# Patient Record
Sex: Female | Born: 1935
Health system: Southern US, Community
[De-identification: ages and names within clinical notes are randomized; demographics above are authoritative.]

## PROBLEM LIST (undated history)

## (undated) DIAGNOSIS — E119 Type 2 diabetes mellitus without complications: Secondary | ICD-10-CM

## (undated) DIAGNOSIS — M1990 Unspecified osteoarthritis, unspecified site: Secondary | ICD-10-CM

## (undated) DIAGNOSIS — I639 Cerebral infarction, unspecified: Secondary | ICD-10-CM

## (undated) DIAGNOSIS — M109 Gout, unspecified: Secondary | ICD-10-CM

## (undated) DIAGNOSIS — I82409 Acute embolism and thrombosis of unspecified deep veins of unspecified lower extremity: Secondary | ICD-10-CM

## (undated) DIAGNOSIS — H269 Unspecified cataract: Secondary | ICD-10-CM

## (undated) DIAGNOSIS — Z9221 Personal history of antineoplastic chemotherapy: Secondary | ICD-10-CM

## (undated) DIAGNOSIS — N183 Chronic kidney disease, stage 3 unspecified: Secondary | ICD-10-CM

## (undated) DIAGNOSIS — E7800 Pure hypercholesterolemia, unspecified: Secondary | ICD-10-CM

## (undated) DIAGNOSIS — IMO0001 Reserved for inherently not codable concepts without codable children: Secondary | ICD-10-CM

## (undated) DIAGNOSIS — Z98811 Dental restoration status: Secondary | ICD-10-CM

## (undated) DIAGNOSIS — C44310 Basal cell carcinoma of skin of unspecified parts of face: Secondary | ICD-10-CM

## (undated) DIAGNOSIS — R011 Cardiac murmur, unspecified: Secondary | ICD-10-CM

## (undated) DIAGNOSIS — I10 Essential (primary) hypertension: Secondary | ICD-10-CM

## (undated) DIAGNOSIS — IMO0002 Reserved for concepts with insufficient information to code with codable children: Secondary | ICD-10-CM

## (undated) DIAGNOSIS — Z923 Personal history of irradiation: Secondary | ICD-10-CM

## (undated) DIAGNOSIS — Z972 Presence of dental prosthetic device (complete) (partial): Secondary | ICD-10-CM

## (undated) DIAGNOSIS — I2699 Other pulmonary embolism without acute cor pulmonale: Secondary | ICD-10-CM

## (undated) DIAGNOSIS — C50911 Malignant neoplasm of unspecified site of right female breast: Secondary | ICD-10-CM

## (undated) HISTORY — PX: DILATION AND CURETTAGE OF UTERUS: SHX78

## (undated) HISTORY — DX: Reserved for concepts with insufficient information to code with codable children: IMO0002

## (undated) HISTORY — DX: Essential (primary) hypertension: I10

## (undated) HISTORY — DX: Reserved for inherently not codable concepts without codable children: IMO0001

## (undated) HISTORY — DX: Unspecified osteoarthritis, unspecified site: M19.90

## (undated) HISTORY — PX: TONSILLECTOMY: SUR1361

---

## 1958-11-03 HISTORY — PX: BREAST CYST EXCISION: SHX579

## 1981-03-04 DIAGNOSIS — C44310 Basal cell carcinoma of skin of unspecified parts of face: Secondary | ICD-10-CM

## 1981-03-04 HISTORY — PX: BASAL CELL CARCINOMA EXCISION: SHX1214

## 1981-03-04 HISTORY — DX: Basal cell carcinoma of skin of unspecified parts of face: C44.310

## 1997-05-20 ENCOUNTER — Other Ambulatory Visit: Admission: RE | Admit: 1997-05-20 | Discharge: 1997-05-20 | Payer: Self-pay | Admitting: Gynecology

## 1997-06-12 ENCOUNTER — Emergency Department (HOSPITAL_COMMUNITY): Admission: EM | Admit: 1997-06-12 | Discharge: 1997-06-12 | Payer: Self-pay | Admitting: Emergency Medicine

## 1998-01-03 ENCOUNTER — Ambulatory Visit (HOSPITAL_COMMUNITY): Admission: RE | Admit: 1998-01-03 | Discharge: 1998-01-03 | Payer: Self-pay | Admitting: Gastroenterology

## 1998-03-08 ENCOUNTER — Ambulatory Visit (HOSPITAL_COMMUNITY): Admission: RE | Admit: 1998-03-08 | Discharge: 1998-03-08 | Payer: Self-pay | Admitting: Obstetrics & Gynecology

## 1998-05-17 ENCOUNTER — Other Ambulatory Visit: Admission: RE | Admit: 1998-05-17 | Discharge: 1998-05-17 | Payer: Self-pay | Admitting: Gynecology

## 1999-10-30 ENCOUNTER — Encounter: Payer: Self-pay | Admitting: Gynecology

## 1999-10-30 ENCOUNTER — Encounter: Admission: RE | Admit: 1999-10-30 | Discharge: 1999-10-30 | Payer: Self-pay | Admitting: Gynecology

## 2000-10-30 ENCOUNTER — Encounter: Admission: RE | Admit: 2000-10-30 | Discharge: 2000-10-30 | Payer: Self-pay | Admitting: Gynecology

## 2000-10-30 ENCOUNTER — Encounter: Payer: Self-pay | Admitting: Gynecology

## 2001-05-26 ENCOUNTER — Other Ambulatory Visit: Admission: RE | Admit: 2001-05-26 | Discharge: 2001-05-26 | Payer: Self-pay | Admitting: Gynecology

## 2001-07-21 ENCOUNTER — Other Ambulatory Visit: Admission: RE | Admit: 2001-07-21 | Discharge: 2001-07-21 | Payer: Self-pay | Admitting: Gynecology

## 2001-11-03 ENCOUNTER — Encounter: Payer: Self-pay | Admitting: Internal Medicine

## 2001-11-03 ENCOUNTER — Encounter: Admission: RE | Admit: 2001-11-03 | Discharge: 2001-11-03 | Payer: Self-pay | Admitting: Internal Medicine

## 2002-02-03 ENCOUNTER — Other Ambulatory Visit: Admission: RE | Admit: 2002-02-03 | Discharge: 2002-02-03 | Payer: Self-pay | Admitting: Gynecology

## 2002-03-17 ENCOUNTER — Encounter: Admission: RE | Admit: 2002-03-17 | Discharge: 2002-06-15 | Payer: Self-pay | Admitting: Internal Medicine

## 2002-09-06 ENCOUNTER — Other Ambulatory Visit: Admission: RE | Admit: 2002-09-06 | Discharge: 2002-09-06 | Payer: Self-pay | Admitting: Gynecology

## 2002-11-26 ENCOUNTER — Encounter: Admission: RE | Admit: 2002-11-26 | Discharge: 2002-11-26 | Payer: Self-pay | Admitting: Gynecology

## 2002-11-26 ENCOUNTER — Encounter: Payer: Self-pay | Admitting: Gynecology

## 2003-01-31 ENCOUNTER — Ambulatory Visit (HOSPITAL_COMMUNITY): Admission: RE | Admit: 2003-01-31 | Discharge: 2003-01-31 | Payer: Self-pay | Admitting: Gastroenterology

## 2003-10-04 ENCOUNTER — Other Ambulatory Visit: Admission: RE | Admit: 2003-10-04 | Discharge: 2003-10-04 | Payer: Self-pay | Admitting: Gynecology

## 2003-11-28 ENCOUNTER — Ambulatory Visit (HOSPITAL_COMMUNITY): Admission: RE | Admit: 2003-11-28 | Discharge: 2003-11-28 | Payer: Self-pay | Admitting: Gynecology

## 2003-12-01 ENCOUNTER — Encounter: Admission: RE | Admit: 2003-12-01 | Discharge: 2003-12-01 | Payer: Self-pay | Admitting: Gynecology

## 2004-10-09 ENCOUNTER — Other Ambulatory Visit: Admission: RE | Admit: 2004-10-09 | Discharge: 2004-10-09 | Payer: Self-pay | Admitting: Gynecology

## 2004-12-05 ENCOUNTER — Encounter: Admission: RE | Admit: 2004-12-05 | Discharge: 2004-12-05 | Payer: Self-pay | Admitting: Gynecology

## 2005-10-15 ENCOUNTER — Other Ambulatory Visit: Admission: RE | Admit: 2005-10-15 | Discharge: 2005-10-15 | Payer: Self-pay | Admitting: Gynecology

## 2005-12-06 ENCOUNTER — Encounter: Admission: RE | Admit: 2005-12-06 | Discharge: 2005-12-06 | Payer: Self-pay | Admitting: Gynecology

## 2006-04-05 ENCOUNTER — Emergency Department (HOSPITAL_COMMUNITY): Admission: EM | Admit: 2006-04-05 | Discharge: 2006-04-05 | Payer: Self-pay | Admitting: Emergency Medicine

## 2006-10-22 ENCOUNTER — Other Ambulatory Visit: Admission: RE | Admit: 2006-10-22 | Discharge: 2006-10-22 | Payer: Self-pay | Admitting: Gynecology

## 2006-12-09 ENCOUNTER — Encounter: Admission: RE | Admit: 2006-12-09 | Discharge: 2006-12-09 | Payer: Self-pay | Admitting: Gynecology

## 2007-12-10 ENCOUNTER — Encounter: Admission: RE | Admit: 2007-12-10 | Discharge: 2007-12-10 | Payer: Self-pay | Admitting: Geriatric Medicine

## 2007-12-11 ENCOUNTER — Encounter: Admission: RE | Admit: 2007-12-11 | Discharge: 2007-12-11 | Payer: Self-pay | Admitting: Geriatric Medicine

## 2008-12-13 ENCOUNTER — Encounter: Admission: RE | Admit: 2008-12-13 | Discharge: 2008-12-13 | Payer: Self-pay | Admitting: Gynecology

## 2009-12-14 ENCOUNTER — Encounter: Admission: RE | Admit: 2009-12-14 | Discharge: 2009-12-14 | Payer: Self-pay | Admitting: Geriatric Medicine

## 2010-07-20 NOTE — Op Note (Signed)
NAMETONY, SCHURIG                            ACCOUNT NO.:  [id number]   MEDICAL RECORD NO.:  [id number]                   PATIENT TYPE:  AMB   LOCATION:  ENDO                                 FACILITY:  [facility]   PHYSICIAN:  Earle Gell, M.D.                DATE OF BIRTH:  03/05/1926   DATE OF PROCEDURE:  01/31/2003  DATE OF DISCHARGE:                                 OPERATIVE REPORT   PROCEDURE:  Colonoscopy.   PROCEDURE INDICATION:  Ms. Sabrina [name] is a 77 year old female born 1926-03-17.  Ms. Sabrina [name] is scheduled to undergo her first diagnostic colonoscopy  to evaluate intermittent, painless hematochezia probably due to hemorrhoidal  bleeding.   ENDOSCOPIST:  Earle Gell, M.D.   PREMEDICATION:  Versed 6 mg, Demerol 50 mg.   DESCRIPTION OF PROCEDURE:  After obtaining informed consent, Ms. Sabrina [name] was  placed in the left lateral decubitus position.  I administered intravenous  Demerol and intravenous Versed to achieve conscious sedation for the  procedure.  The patient's blood pressure, oxygen saturation, and cardiac  rhythm were monitored throughout the procedure and documented in the medical  record.   Anal inspection was normal.  Digital rectal exam was normal.  The Olympus  adjustable pediatric colonoscope was introduced into the rectum and advanced  to the cecum.  The colonic preparation for the exam today was excellent.   Ms. Sabrina [name] has universal colonic diverticulosis without diverticulitis or  diverticular stricture formation.   Rectum:  Normal.   Sigmoid colon and descending colon:  Normal.   Splenic flexure:  Normal.   Transverse colon:  Normal.   Hepatic flexure:  Normal.   Ascending colon:  Normal.   Cecum and ileocecal valve:  Normal.   ASSESSMENT:  Universal colonic diverticulosis; otherwise normal  proctocolonoscopy to the cecum.  No endoscopic evidence for the presence of  colorectal neoplasia or inflammatory bowel disease.                                   Earle Gell, M.D.    MJ/MEDQ  D:  01/31/2003  T:  01/31/2003  Job:  [id number]   cc:   Deborah Chalk, M.D.  [address], [address]  [place] [id number]  Fax: [phone]

## 2010-11-28 ENCOUNTER — Other Ambulatory Visit: Payer: Self-pay | Admitting: Geriatric Medicine

## 2010-11-28 DIAGNOSIS — Z1231 Encounter for screening mammogram for malignant neoplasm of breast: Secondary | ICD-10-CM

## 2010-12-18 ENCOUNTER — Ambulatory Visit
Admission: RE | Admit: 2010-12-18 | Discharge: 2010-12-18 | Disposition: A | Discharge status: Home / Self Care | Payer: BLUE CROSS/BLUE SHIELD | Source: Ambulatory Visit | Attending: Geriatric Medicine | Admitting: Geriatric Medicine

## 2010-12-18 DIAGNOSIS — Z1231 Encounter for screening mammogram for malignant neoplasm of breast: Secondary | ICD-10-CM

## 2012-01-02 ENCOUNTER — Other Ambulatory Visit: Payer: Self-pay | Admitting: Geriatric Medicine

## 2012-01-02 DIAGNOSIS — Z1231 Encounter for screening mammogram for malignant neoplasm of breast: Secondary | ICD-10-CM

## 2012-02-14 ENCOUNTER — Ambulatory Visit
Admission: RE | Admit: 2012-02-14 | Discharge: 2012-02-14 | Disposition: A | Discharge status: Home / Self Care | Payer: Medicare Other | Source: Ambulatory Visit | Attending: Geriatric Medicine | Admitting: Geriatric Medicine

## 2012-02-14 DIAGNOSIS — Z1231 Encounter for screening mammogram for malignant neoplasm of breast: Secondary | ICD-10-CM

## 2013-01-15 ENCOUNTER — Other Ambulatory Visit: Payer: Self-pay

## 2013-01-15 DIAGNOSIS — Z1231 Encounter for screening mammogram for malignant neoplasm of breast: Secondary | ICD-10-CM

## 2013-02-18 ENCOUNTER — Ambulatory Visit
Admission: RE | Admit: 2013-02-18 | Discharge: 2013-02-18 | Disposition: A | Discharge status: Home / Self Care | Payer: Medicare Other | Source: Ambulatory Visit

## 2013-02-18 DIAGNOSIS — Z1231 Encounter for screening mammogram for malignant neoplasm of breast: Secondary | ICD-10-CM

## 2013-03-01 ENCOUNTER — Other Ambulatory Visit: Payer: Self-pay | Admitting: Geriatric Medicine

## 2013-03-01 DIAGNOSIS — R928 Other abnormal and inconclusive findings on diagnostic imaging of breast: Secondary | ICD-10-CM

## 2013-03-04 DIAGNOSIS — C50911 Malignant neoplasm of unspecified site of right female breast: Secondary | ICD-10-CM

## 2013-03-04 HISTORY — DX: Malignant neoplasm of unspecified site of right female breast: C50.911

## 2013-03-08 ENCOUNTER — Other Ambulatory Visit: Payer: Self-pay | Admitting: Geriatric Medicine

## 2013-03-08 ENCOUNTER — Ambulatory Visit
Admission: RE | Admit: 2013-03-08 | Discharge: 2013-03-08 | Disposition: A | Discharge status: Home / Self Care | Payer: Medicare Other | Source: Ambulatory Visit | Attending: Geriatric Medicine | Admitting: Geriatric Medicine

## 2013-03-08 DIAGNOSIS — R928 Other abnormal and inconclusive findings on diagnostic imaging of breast: Secondary | ICD-10-CM

## 2013-03-17 ENCOUNTER — Ambulatory Visit
Admission: RE | Admit: 2013-03-17 | Discharge: 2013-03-17 | Disposition: A | Discharge status: Home / Self Care | Payer: Medicare Other | Source: Ambulatory Visit | Attending: Geriatric Medicine | Admitting: Geriatric Medicine

## 2013-03-17 ENCOUNTER — Other Ambulatory Visit: Payer: Self-pay | Admitting: Geriatric Medicine

## 2013-03-17 DIAGNOSIS — R928 Other abnormal and inconclusive findings on diagnostic imaging of breast: Secondary | ICD-10-CM

## 2013-03-17 DIAGNOSIS — N6310 Unspecified lump in the right breast, unspecified quadrant: Secondary | ICD-10-CM

## 2013-03-17 HISTORY — PX: BREAST BIOPSY: SHX20

## 2013-03-18 ENCOUNTER — Ambulatory Visit
Admission: RE | Admit: 2013-03-18 | Discharge: 2013-03-18 | Disposition: A | Discharge status: Home / Self Care | Payer: Medicare Other | Source: Ambulatory Visit | Attending: Geriatric Medicine | Admitting: Geriatric Medicine

## 2013-03-18 DIAGNOSIS — N6310 Unspecified lump in the right breast, unspecified quadrant: Secondary | ICD-10-CM

## 2013-03-19 ENCOUNTER — Telehealth: Payer: Self-pay | Admitting: *Deleted

## 2013-03-19 DIAGNOSIS — C50411 Malignant neoplasm of upper-outer quadrant of right female breast: Secondary | ICD-10-CM

## 2013-03-19 NOTE — Telephone Encounter (Signed)
Confirmed [facility] for 03/24/13 at 1230.  Instructions and contact information given.

## 2013-03-24 ENCOUNTER — Other Ambulatory Visit (HOSPITAL_BASED_OUTPATIENT_CLINIC_OR_DEPARTMENT_OTHER): Payer: Medicare Other

## 2013-03-24 ENCOUNTER — Ambulatory Visit: Payer: Medicare Other

## 2013-03-24 ENCOUNTER — Ambulatory Visit
Admission: RE | Admit: 2013-03-24 | Discharge: 2013-03-24 | Disposition: A | Discharge status: Home / Self Care | Payer: Medicare Other | Source: Ambulatory Visit | Attending: Radiation Oncology | Admitting: Radiation Oncology

## 2013-03-24 ENCOUNTER — Encounter: Payer: Self-pay | Admitting: *Deleted

## 2013-03-24 ENCOUNTER — Encounter (INDEPENDENT_AMBULATORY_CARE_PROVIDER_SITE_OTHER): Payer: Self-pay | Admitting: General Surgery

## 2013-03-24 ENCOUNTER — Ambulatory Visit: Payer: Medicare Other | Attending: General Surgery | Admitting: Physical Therapy

## 2013-03-24 ENCOUNTER — Ambulatory Visit (HOSPITAL_BASED_OUTPATIENT_CLINIC_OR_DEPARTMENT_OTHER): Payer: Medicare Other | Admitting: General Surgery

## 2013-03-24 ENCOUNTER — Ambulatory Visit (HOSPITAL_BASED_OUTPATIENT_CLINIC_OR_DEPARTMENT_OTHER): Payer: Medicare Other | Admitting: Oncology

## 2013-03-24 ENCOUNTER — Encounter: Payer: Self-pay | Admitting: Oncology

## 2013-03-24 VITALS — BP 200/82 | HR 106 | Temp 98.4°F | Resp 20 | Ht 58.5 in | Wt 186.7 lb

## 2013-03-24 DIAGNOSIS — C50411 Malignant neoplasm of upper-outer quadrant of right female breast: Secondary | ICD-10-CM

## 2013-03-24 DIAGNOSIS — C773 Secondary and unspecified malignant neoplasm of axilla and upper limb lymph nodes: Secondary | ICD-10-CM

## 2013-03-24 DIAGNOSIS — R293 Abnormal posture: Secondary | ICD-10-CM | POA: Insufficient documentation

## 2013-03-24 DIAGNOSIS — N183 Chronic kidney disease, stage 3 unspecified: Secondary | ICD-10-CM | POA: Insufficient documentation

## 2013-03-24 DIAGNOSIS — C50419 Malignant neoplasm of upper-outer quadrant of unspecified female breast: Secondary | ICD-10-CM

## 2013-03-24 DIAGNOSIS — C50919 Malignant neoplasm of unspecified site of unspecified female breast: Secondary | ICD-10-CM | POA: Insufficient documentation

## 2013-03-24 DIAGNOSIS — IMO0001 Reserved for inherently not codable concepts without codable children: Secondary | ICD-10-CM | POA: Insufficient documentation

## 2013-03-24 DIAGNOSIS — Z170 Estrogen receptor positive status [ER+]: Secondary | ICD-10-CM

## 2013-03-24 DIAGNOSIS — E119 Type 2 diabetes mellitus without complications: Secondary | ICD-10-CM | POA: Insufficient documentation

## 2013-03-24 LAB — COMPREHENSIVE METABOLIC PANEL (CC13)
ALT: 18 U/L (ref 0–55)
AST: 13 U/L (ref 5–34)
Albumin: 4 g/dL (ref 3.5–5.0)
Alkaline Phosphatase: 114 U/L (ref 40–150)
Anion Gap: 11 mEq/L (ref 3–11)
BUN: 36.6 mg/dL — ABNORMAL HIGH (ref 7.0–26.0)
CO2: 29 mEq/L (ref 22–29)
Calcium: 9.7 mg/dL (ref 8.4–10.4)
Chloride: 101 mEq/L (ref 98–109)
Creatinine: 1.7 mg/dL — ABNORMAL HIGH (ref 0.6–1.1)
Glucose: 266 mg/dl — ABNORMAL HIGH (ref 70–140)
Potassium: 4.2 mEq/L (ref 3.5–5.1)
Sodium: 141 mEq/L (ref 136–145)
Total Bilirubin: 0.85 mg/dL (ref 0.20–1.20)
Total Protein: 7.4 g/dL (ref 6.4–8.3)

## 2013-03-24 LAB — CBC WITH DIFFERENTIAL/PLATELET
BASO%: 0.3 % (ref 0.0–2.0)
Basophils Absolute: 0 10*3/uL (ref 0.0–0.1)
EOS%: 1.9 % (ref 0.0–7.0)
Eosinophils Absolute: 0.1 10*3/uL (ref 0.0–0.5)
HCT: 35.4 % (ref 34.8–46.6)
HGB: 11.6 g/dL (ref 11.6–15.9)
LYMPH%: 26.2 % (ref 14.0–49.7)
MCH: 29.7 pg (ref 25.1–34.0)
MCHC: 32.8 g/dL (ref 31.5–36.0)
MCV: 90.5 fL (ref 79.5–101.0)
MONO#: 0.6 10*3/uL (ref 0.1–0.9)
MONO%: 7.9 % (ref 0.0–14.0)
NEUT#: 4.8 10*3/uL (ref 1.5–6.5)
NEUT%: 63.7 % (ref 38.4–76.8)
Platelets: 233 10*3/uL (ref 145–400)
RBC: 3.91 10*6/uL (ref 3.70–5.45)
RDW: 13.4 % (ref 11.2–14.5)
WBC: 7.5 10*3/uL (ref 3.9–10.3)
lymph#: 2 10*3/uL (ref 0.9–3.3)

## 2013-03-24 NOTE — Progress Notes (Signed)
Radiation Oncology         [phone]) [phone] ________________________________  Initial outpatient Consultation - Date: 03/24/2013   Name: Sabrina [name] MRN: [id number]   DOB: [birth year]  REFERRING PHYSICIAN: Rolm Bookbinder, MD  DIAGNOSIS: T1N1 Right Breast Cancer  HISTORY OF PRESENT ILLNESS::Sabrina [name] is a 80 y.o. female  who underwent a screening mammogram. Her previous mammogram in 1 year prior. She was found to have a right breast mass. This measured about 1 cm. On physical exam no abnormality was palpated in the upper outer quadrant of the right breast. Ultrasound showed a 1.3 x 1.2 x 1.0 cm hypoechoic mass in the right upper outer quadrant. In the right axilla A. abnormal appearing lymph node was noted measuring 1.6 x 1.4 x 0.8 cm. Both of these areas were biopsied. The lymph node was positive for metastatic carcinoma. The right breast was positive for a grade 2 invasive ductal carcinoma which was ER/PR positive. HER-2 was equivocal. Ki-67 was 15%. She is present with her husband and sister today. She has no history of breast cancer. She does have a history of a right breast cyst. She has no children. She had menses at 14 with her last period in 1994. She used hormone replacement but quit about 20 years ago. She has some arthritis and back pain.[name]  PREVIOUS RADIATION THERAPY: No  PAST MEDICAL HISTORY:  has a past medical history of Diabetes mellitus without complication; Hypertension; and Arthritis.    PAST SURGICAL HISTORY: Past Surgical History  Procedure Laterality Date  . Tonsillectomy      FAMILY HISTORY: No family history on file.  SOCIAL HISTORY:  History  Substance Use Topics  . Smoking status: Never Smoker   . Smokeless tobacco: Not on file  . Alcohol Use: Yes     Comment: 2-3 glasses    ALLERGIES: Review of patient's allergies indicates no known allergies.  MEDICATIONS:  No current outpatient prescriptions on file.   No current facility-administered medications  for this encounter.    REVIEW OF SYSTEMS:  A 15 point review of systems is documented in the electronic medical record. This was obtained by the nursing staff. However, I reviewed this with the patient to discuss relevant findings and make appropriate changes.  Pertinent items are noted in HPI.  PHYSICAL EXAM: There were no vitals filed for this visit.. . She is a pleasant female who appears her stated age. She has small ptotic breasts bilaterally. She has bruising over the lateral aspect of the right breast. She has some fullness of the right axilla. Am not able to palpate a distinct lymph node. She has some biopsy change versus palpable mass in the upper outer quadrant of the right breast. She has no palpable abnormalities of the left breast. She is alert minus x3. She is 5 out of 5 strength bilaterally.  LABORATORY DATA:  Lab Results  Component Value Date   WBC 7.5 03/24/2013   HGB 11.6 03/24/2013   HCT 35.4 03/24/2013   MCV 90.5 03/24/2013   PLT 233 03/24/2013   Lab Results  Component Value Date   NA 141 03/24/2013   K 4.2 03/24/2013   CO2 29 03/24/2013   Lab Results  Component Value Date   ALT 18 03/24/2013   AST 13 03/24/2013   ALKPHOS 114 03/24/2013   BILITOT 0.85 03/24/2013     RADIOGRAPHY: Mm Digital Diagnostic Unilat R  03/17/2013   CLINICAL DATA:  Suspicious right breast mass.  EXAM: POST-BIOPSY CLIP  PLACEMENT RIGHT DIAGNOSTIC MAMMOGRAM  COMPARISON:  Previous exams.  FINDINGS: Films are performed following ultrasound guided biopsy of suspicious right breast mass. Wing shaped biopsy marking clip in appropriate position.  IMPRESSION: Appropriate position biopsy marking clip status post ultrasound-guided core needle biopsy of suspicious right breast mass.  Final Assessment: Post Procedure Mammograms for Marker Placement   Electronically Signed   By: Lovey Newcomer M.D.   On: 03/17/2013 11:21   Mm Radiologist Eval And Mgmt  03/18/2013   EXAM: ESTABLISHED PATIENT OFFICE VISIT - LEVEL II  ([id number])  EXAM: Ecchymosis of the lateral right breast at the site of biopsy as well as at the right axilla is noted. Clean bandages are in place.  HISTORY OF PRESENT ILLNESS: Patient presents following an ultrasound-guided biopsy of a right breast mass and right axillary lymph node. She has mild soreness in the right axilla and in the right breast at site of biopsy.  CHIEF COMPLAINT: Status post ultrasound-guided biopsy of a right breast mass and right axillary lymph node.  PATHOLOGY: Pathology of the tissue samples from 9 o'clock in the right breast demonstrates invasive mammary carcinoma. Pathology of the right axillary lymph node demonstrates metastatic carcinoma.  ASSESSMENT AND PLAN: ASSESSMENT AND PLAN Pathology is concordant with imaging findings. Surgical consultation is recommended. She is scheduled for the multidisciplinary clinic on 03/24/2013.   Electronically Signed   By: Donavan Burnet M.D.   On: 03/18/2013 13:33   US Breast Complete Uni Right Inc Axilla  03/08/2013   CLINICAL DATA:  RECALL FROM SCREENING 3D MAMMOGRAPHY.  EXAM: ULTRASOUND OF THE RIGHT BREAST  COMPARISON:  Prior examinations.  FINDINGS: On physical exam,there is no discrete palpable abnormality within the upper outer quadrant of the right breast.  Ultrasound is performed, showing an irregularly marginated hypoechoic mass with angular margins and shadowing located within the right breast at the 9:30 o'clock position 4 cm from the nipple. This measures 1.3 x 1.2 x 1.0 cm in size and is worrisome for invasive mammary carcinoma.  Ultrasound of the right axilla demonstrates an abnormal appearing low axillary (level 1) lymph node with loss of normal fatty hilum. This measures 1.6 x 1.4 x 0.8 cm in size.  I have discussed ultrasound-guided core biopsy of the right breast mass and abnormal right axillary lymph node with the patient. This will be scheduled.  IMPRESSION: 1.3 cm suspicious mass located within the right breast at the 9:30  o'clock position 4 cm from the nipple and abnormal appearing low axillary (level 1) right axillary lymph node. Tissue sampling via ultrasound-guided core biopsy of the breast mass and axillary lymph node is recommended and has been scheduled for 03/17/2013.  RECOMMENDATION: Right breast ultrasound-guided core biopsy and ultrasound-guided core biopsy of abnormal appearing right axillary lymph node.  I have discussed the findings and recommendations with the patient. Results were also provided in writing at the conclusion of the visit. If applicable, a reminder letter will be sent to the patient regarding the next appointment.  BI-RADS CATEGORY  5: Highly suggestive of malignancy - appropriate action should be taken.   Electronically Signed   By: Luberta Robertson M.D.   On: 03/08/2013 12:03   [place] Rt Breast Bx W Loc Dev 1st Lesion Img Bx Spec US Guide  03/17/2013   CLINICAL DATA:  Patient with suspicious right breast mass and enlarged right axillary lymph node.  EXAM: ULTRASOUND GUIDED RIGHT BREAST CORE NEEDLE BIOPSY WITH VACUUM ASSIST  COMPARISON:  Previous exams.  PROCEDURE: I  met with the patient and we discussed the procedure of ultrasound-guided biopsy, including benefits and alternatives. We discussed the high likelihood of a successful procedure. We discussed the risks of the procedure including infection, bleeding, tissue injury, clip migration, and inadequate sampling. Informed written consent was given. The usual time-out protocol was performed immediately prior to the procedure.  Using sterile technique and 2% Lidocaine as local anesthetic, under direct ultrasound visualization, a 12 gauge vacuum-assisteddevice was used to perform biopsy of suspicious right breast mass using a lateral approach. At the conclusion of the procedure, a wing shaped tissue marker clip was deployed into the biopsy cavity. Follow-up 2-view mammogram was performed and dictated separately.  Using sterile technique and 2% Lidocaine as  local anesthetic, under direct ultrasound visualization, a 12 gauge vacuum-assisteddevice was used to perform biopsy of enlarged right axillary lymph node using a lateral approach.  IMPRESSION: Ultrasound-guided biopsy of suspicious right breast mass and enlarged right axillary lymph node. No apparent complications.   Electronically Signed   By: Lovey Newcomer M.D.   On: 03/17/2013 11:20   [place] Rt Breast Bx W Loc Dev Ea Add Lesion Img Bx Spec US Guide  03/17/2013   CLINICAL DATA:  Patient with suspicious right breast mass and enlarged right axillary lymph node.  EXAM: ULTRASOUND GUIDED RIGHT BREAST CORE NEEDLE BIOPSY WITH VACUUM ASSIST  COMPARISON:  Previous exams.  PROCEDURE: I met with the patient and we discussed the procedure of ultrasound-guided biopsy, including benefits and alternatives. We discussed the high likelihood of a successful procedure. We discussed the risks of the procedure including infection, bleeding, tissue injury, clip migration, and inadequate sampling. Informed written consent was given. The usual time-out protocol was performed immediately prior to the procedure.  Using sterile technique and 2% Lidocaine as local anesthetic, under direct ultrasound visualization, a 12 gauge vacuum-assisteddevice was used to perform biopsy of suspicious right breast mass using a lateral approach. At the conclusion of the procedure, a wing shaped tissue marker clip was deployed into the biopsy cavity. Follow-up 2-view mammogram was performed and dictated separately.  Using sterile technique and 2% Lidocaine as local anesthetic, under direct ultrasound visualization, a 12 gauge vacuum-assisteddevice was used to perform biopsy of enlarged right axillary lymph node using a lateral approach.  IMPRESSION: Ultrasound-guided biopsy of suspicious right breast mass and enlarged right axillary lymph node. No apparent complications.   Electronically Signed   By: Lovey Newcomer M.D.   On: 03/17/2013 11:20      IMPRESSION:  T1 N1 invasive ductal carcinoma of the right breast ER/PR positive HER-2 equivocal with a positive right axillary lymph node  PLAN: I discussed with the patient her options for treatment. We discussed the process of simulation the placement tattoos. Discussed the role of radiation and decreasing local failures in patients who undergo lumpectomy. We discussed treatment of her breast and supraclavicular lymph nodes as well as possibly the axilla depending on the extent of axillary involvement at the time of surgery. We discussed 6 weeks of treatment as an outpatient. We discussed skin redness and fatigue as possible side effects. Let her know I would see her after surgery and Dr. Humphrey Rolls may order an Oncotype test if she is found to be HER-2 negative. I would followup with her after that. He met with surgery medical oncology physical therapy and a member of our patient family support staff. I will followup with her after surgery and chemotherapy if that is planned.  I spent 40 minutes  face  to face with the patient and more than 50% of that time was spent in counseling and/or coordination of care.   ------------------------------------------------  Thea Silversmith, MD

## 2013-03-24 NOTE — Progress Notes (Signed)
Pt signed a ROI and I took it to Med Rec to scan.  Pt signed [facility] Hippa and I faxed it to [place] at [place].

## 2013-03-24 NOTE — Progress Notes (Signed)
[place] Work  Clinical Social Work met with Pt, her husband and sister at her breast clinic appointment to assess psychosocial needs and concerns. Pt declined to complete Distress Screen and was tearful today. She was very eager to meet with the medical team to get more information about her treatment plan.. CSW provided supportive listening and emotional support to Pt. Pt appeared somewhat calmer, but still anxious.  CSW reviewed with Pt and family resources/programs at the [facility] to assist. CSW provided Pt written information about the Support Team and resources. Pt and family are agreeable to CSW calling in the next few days to follow up and reassess needs.   Clinical Social Work interventions: Supportive listening and emotional support. Pt education. Resource education. Follow up call planned for later this week.    Loren Racer, LCSW Clinical Social Worker [facility] [place] [facility] Wednesday, Thursday and Friday Phone: [phone] Fax: [phone]

## 2013-03-24 NOTE — Addendum Note (Signed)
Addended byRolm Bookbinder on: 03/24/2013 04:03 PM   Modules accepted: Orders

## 2013-03-24 NOTE — Progress Notes (Signed)
ELLAROSE BRANDI [id number] March 24, 1926 80 y.o. 03/24/2013 2:47 PM  CC  [name], MD [address] [place] [address] Dr. Thea Silversmith Dr. Rolm Bookbinder  REASON FOR CONSULTATION:  80 year old now with new diagnosis of T1 N1 right breast cancer. Patient is seen in the multidisciplinary breast clinic for discussion of treatment options.   STAGE:   Breast cancer of upper-outer quadrant of right female breast   Primary site: Breast (Right)   Staging method: AJCC 7th Edition   Clinical: Stage IA (T1c, N0, cM0)   Summary: Stage IA (T1c, N0, cM0)  REFERRING PHYSICIAN: Dr. Rolm Bookbinder  HISTORY OF PRESENT ILLNESS:  Kerryann L Molina is a 80 y.o. female.  Who underwent a screening mammogram. She was found to have a right breast mass measuring 1 cm. There were no abnormalities on her physical exam. Patient had ultrasound performed that showed 1.3 x 1.2 x 1.0 cm hypoechoic mass in the upper outer quadrant of the right breast. In the right axilla there was an abnormal appearing lymph node measuring 1.65 1.4 x 0.8 cm. Both her biopsy. The lymph node was positive for metastatic carcinoma. The primary breast tumor biopsy showed invasive ductal carcinoma, grade 2, ER positive PR positive HER-2/neu equivocal with a proliferation marker Ki-67 15%. Patient does have a prior history of breast cysts. She has history of chronic back pain and arthritis. Today she is without any complaints. Her case was discussed at the multidisciplinary breast conference. Her radiology and pathology were reviewed. She was also seen today by Dr. Rolm Bookbinder and Dr. Thea Silversmith.   Past Medical History: Past Medical History  Diagnosis Date  . Diabetes mellitus without complication   . Hypertension   . Arthritis     Past Surgical History: Past Surgical History  Procedure Laterality Date  . Tonsillectomy      Family History: History reviewed. No pertinent family  history.  Social History History  Substance Use Topics  . Smoking status: Never Smoker   . Smokeless tobacco: Not on file  . Alcohol Use: Yes     Comment: 2-3 glasses    Allergies: No Known Allergies  Current Medications: No current outpatient prescriptions on file.   No current facility-administered medications for this visit.    OB/GYN History: menarche at age 15 she underwent menopause in 1994hormone replacement therapy for 20 years she discontinued in 1994. Patient is nulliparous.  Fertility Discussion:not applicable Prior History of Cancer:no  Health Maintenance:  Colonoscopy2000 Bone Density2013 Last PAP smearunknown  ECOG PERFORMANCE STATUS: 1 - Symptomatic but completely ambulatory  Genetic Counseling/testing: no  REVIEW OF SYSTEMS:  A comprehensive review of systems was negative.  PHYSICAL EXAMINATION: Blood pressure 200/82, pulse 106, temperature 98.4 F (36.9 C), temperature source Oral, resp. rate 20, height 4' 10.5" (1.486 m), weight 186 lb 11.2 oz (84.687 kg).  TDS:KAJGO, healthy and no distress SKIN: skin color, texture, turgor are normal HEAD: Normocephalic EYES: normal, PERRLA, EOMI EARS: External ears normal OROPHARYNX:no exudate, no erythema and lips, buccal mucosa, and tongue normal  NECK: no adenopathy LYMPH:  enlarged lymph nodes palpated in the right axilla BREAST:left breast normal without mass, skin or nipple changes or axillary nodes, abnormal mass palpable area of ecchymosis in the right axilla and breast LUNGS: clear to auscultation and percussion HEART: regular rate & rhythm ABDOMEN:abdomen soft, non-tender, normal bowel sounds and no masses or organomegaly BACK: Back symmetric, no curvature., No CVA tenderness EXTREMITIES:no edema  NEURO: alert & oriented  x 3 with fluent speech, no focal motor/sensory deficits, gait normal     STUDIES/RESULTS: Mm Digital Diagnostic Unilat R  03/17/2013   CLINICAL DATA:  Suspicious right  breast mass.  EXAM: POST-BIOPSY CLIP PLACEMENT RIGHT DIAGNOSTIC MAMMOGRAM  COMPARISON:  Previous exams.  FINDINGS: Films are performed following ultrasound guided biopsy of suspicious right breast mass. Wing shaped biopsy marking clip in appropriate position.  IMPRESSION: Appropriate position biopsy marking clip status post ultrasound-guided core needle biopsy of suspicious right breast mass.  Final Assessment: Post Procedure Mammograms for Marker Placement   Electronically Signed   By: Lovey Newcomer M.D.   On: 03/17/2013 11:21   Mm Radiologist Eval And Mgmt  03/18/2013   EXAM: ESTABLISHED PATIENT OFFICE VISIT - LEVEL II ([id number])  EXAM: Ecchymosis of the lateral right breast at the site of biopsy as well as at the right axilla is noted. Clean bandages are in place.  HISTORY OF PRESENT ILLNESS: Patient presents following an ultrasound-guided biopsy of a right breast mass and right axillary lymph node. She has mild soreness in the right axilla and in the right breast at site of biopsy.  CHIEF COMPLAINT: Status post ultrasound-guided biopsy of a right breast mass and right axillary lymph node.  PATHOLOGY: Pathology of the tissue samples from 9 o'clock in the right breast demonstrates invasive mammary carcinoma. Pathology of the right axillary lymph node demonstrates metastatic carcinoma.  ASSESSMENT AND PLAN: ASSESSMENT AND PLAN Pathology is concordant with imaging findings. Surgical consultation is recommended. She is scheduled for the multidisciplinary clinic on 03/24/2013.   Electronically Signed   By: Donavan Burnet M.D.   On: 03/18/2013 13:33   US Breast Complete Uni Right Inc Axilla  03/08/2013   CLINICAL DATA:  RECALL FROM SCREENING 3D MAMMOGRAPHY.  EXAM: ULTRASOUND OF THE RIGHT BREAST  COMPARISON:  Prior examinations.  FINDINGS: On physical exam,there is no discrete palpable abnormality within the upper outer quadrant of the right breast.  Ultrasound is performed, showing an irregularly marginated  hypoechoic mass with angular margins and shadowing located within the right breast at the 9:30 o'clock position 4 cm from the nipple. This measures 1.3 x 1.2 x 1.0 cm in size and is worrisome for invasive mammary carcinoma.  Ultrasound of the right axilla demonstrates an abnormal appearing low axillary (level 1) lymph node with loss of normal fatty hilum. This measures 1.6 x 1.4 x 0.8 cm in size.  I have discussed ultrasound-guided core biopsy of the right breast mass and abnormal right axillary lymph node with the patient. This will be scheduled.  IMPRESSION: 1.3 cm suspicious mass located within the right breast at the 9:30 o'clock position 4 cm from the nipple and abnormal appearing low axillary (level 1) right axillary lymph node. Tissue sampling via ultrasound-guided core biopsy of the breast mass and axillary lymph node is recommended and has been scheduled for 03/17/2013.  RECOMMENDATION: Right breast ultrasound-guided core biopsy and ultrasound-guided core biopsy of abnormal appearing right axillary lymph node.  I have discussed the findings and recommendations with the patient. Results were also provided in writing at the conclusion of the visit. If applicable, a reminder letter will be sent to the patient regarding the next appointment.  BI-RADS CATEGORY  5: Highly suggestive of malignancy - appropriate action should be taken.   Electronically Signed   By: Luberta Robertson M.D.   On: 03/08/2013 12:03   [place] Rt Breast Bx W Loc Dev 1st Lesion Img Bx Spec US Guide  03/17/2013  CLINICAL DATA:  Patient with suspicious right breast mass and enlarged right axillary lymph node.  EXAM: ULTRASOUND GUIDED RIGHT BREAST CORE NEEDLE BIOPSY WITH VACUUM ASSIST  COMPARISON:  Previous exams.  PROCEDURE: I met with the patient and we discussed the procedure of ultrasound-guided biopsy, including benefits and alternatives. We discussed the high likelihood of a successful procedure. We discussed the risks of the procedure  including infection, bleeding, tissue injury, clip migration, and inadequate sampling. Informed written consent was given. The usual time-out protocol was performed immediately prior to the procedure.  Using sterile technique and 2% Lidocaine as local anesthetic, under direct ultrasound visualization, a 12 gauge vacuum-assisteddevice was used to perform biopsy of suspicious right breast mass using a lateral approach. At the conclusion of the procedure, a wing shaped tissue marker clip was deployed into the biopsy cavity. Follow-up 2-view mammogram was performed and dictated separately.  Using sterile technique and 2% Lidocaine as local anesthetic, under direct ultrasound visualization, a 12 gauge vacuum-assisteddevice was used to perform biopsy of enlarged right axillary lymph node using a lateral approach.  IMPRESSION: Ultrasound-guided biopsy of suspicious right breast mass and enlarged right axillary lymph node. No apparent complications.   Electronically Signed   By: Lovey Newcomer M.D.   On: 03/17/2013 11:20   [place] Rt Breast Bx W Loc Dev Ea Add Lesion Img Bx Spec US Guide  03/17/2013   CLINICAL DATA:  Patient with suspicious right breast mass and enlarged right axillary lymph node.  EXAM: ULTRASOUND GUIDED RIGHT BREAST CORE NEEDLE BIOPSY WITH VACUUM ASSIST  COMPARISON:  Previous exams.  PROCEDURE: I met with the patient and we discussed the procedure of ultrasound-guided biopsy, including benefits and alternatives. We discussed the high likelihood of a successful procedure. We discussed the risks of the procedure including infection, bleeding, tissue injury, clip migration, and inadequate sampling. Informed written consent was given. The usual time-out protocol was performed immediately prior to the procedure.  Using sterile technique and 2% Lidocaine as local anesthetic, under direct ultrasound visualization, a 12 gauge vacuum-assisteddevice was used to perform biopsy of suspicious right breast mass using a  lateral approach. At the conclusion of the procedure, a wing shaped tissue marker clip was deployed into the biopsy cavity. Follow-up 2-view mammogram was performed and dictated separately.  Using sterile technique and 2% Lidocaine as local anesthetic, under direct ultrasound visualization, a 12 gauge vacuum-assisteddevice was used to perform biopsy of enlarged right axillary lymph node using a lateral approach.  IMPRESSION: Ultrasound-guided biopsy of suspicious right breast mass and enlarged right axillary lymph node. No apparent complications.   Electronically Signed   By: Lovey Newcomer M.D.   On: 03/17/2013 11:20     LABS:    Chemistry      Component Value Date/Time   NA 141 03/24/2013 1248   K 4.2 03/24/2013 1248   CO2 29 03/24/2013 1248   BUN 36.6* 03/24/2013 1248   CREATININE 1.7* 03/24/2013 1248      Component Value Date/Time   CALCIUM 9.7 03/24/2013 1248   ALKPHOS 114 03/24/2013 1248   AST 13 03/24/2013 1248   ALT 18 03/24/2013 1248   BILITOT 0.85 03/24/2013 1248      Lab Results  Component Value Date   WBC 7.5 03/24/2013   HGB 11.6 03/24/2013   HCT 35.4 03/24/2013   MCV 90.5 03/24/2013   PLT 233 03/24/2013    ASSESSMENT/PLAN:    80 year old female with new diagnosis of intermediate grade invasive ductal carcinoma of the  right breast found on a screening mammogram. Tumor is ER positive PR positive HER-2/neu was equivocal. One node is positive for metastatic disease. Patient is seen in the multidisciplinary breast clinic for discussion of treatment options.We spent the better part of today's hour-long appointment discussing the biology of breast cancer in general, and the specifics of the patient's tumor in particular.  Patient is a good candidate for breast conservation consisting of a lumpectomy but she will require an excellent lymph node dissection do to positive lymph node on initial biopsy. Patient most likely will also require some form of systemic therapy. Patient's HER-2/neu was  equivocal. I have requested that on her final pathology be HER-2/neu be repeated. If it is positive she certainly will need chemotherapy and anti-HER-2 therapy. However if the HER-2/neu is negative we will do an Oncotype DX to determine her breast cancer recurrence score. This will give me an idea of what type of adjuvant systemic treatment she may need. Certainly this could be chemotherapy versus only antiestrogen therapy or a combination of both. Patient will need postlumpectomy radiation therapy. This was discussed by Dr. Thea Silversmith.  Patient will proceed with her surgery first. I will plan on seeing her back in about 3-4 weeks time in followup.  Clinical Trial Eligibility: He no Multidisciplinary conference discussion yes        Discussion: Patient is being treated per NCCN breast cancer care guidelines appropriate for stage.II   Thank you so much for allowing me to participate in the care of Norman. I will continue to follow up the patient with you and assist in her care.  All questions were answered. The patient knows to call the clinic with any problems, questions or concerns. We can certainly see the patient much sooner if necessary.  I spent 40 minutes counseling the patient face to face. The total time spent in the appointment was 60 minutes.   Marcy Panning, MD Medical/Oncology [facility] [phone] (beeper) [phone] (Office)  03/24/2013, 2:47 PM

## 2013-03-24 NOTE — Progress Notes (Signed)
Checked in new pt with no financial concerns. °

## 2013-03-24 NOTE — Progress Notes (Signed)
Patient ID: Sabrina [name], female   DOB: 11/26/1926, 80 y.o.   MRN: [id number]  Chief Complaint  Patient presents with  . Other    HPI Sabrina [name] is a 80 y.o. female.  Referred by Dr Hal Stoneking HPI 86 yof who has no complaints referable to either breast who presents after undergoing a screening mm that shows a right breast mass.  Ultrasound shows a 1.3x1.2x1 cm abnormal appearing lymph node.  She underwent a biopsy of the node that was positive for cancer.  Biopsy of the breast mass shows an invasive ductal carcinoma that is 100% er and pr positive, her2 equivocal, Ki67 is 15%.  She comes in to multidisciplinary clinic to discuss her options.  Past Medical History  Diagnosis Date  . Diabetes mellitus without complication   . Hypertension   . Arthritis     Past Surgical History  Procedure Laterality Date  . Tonsillectomy    . Breast surgery      right breast biopsy benign    History reviewed. No pertinent family history.  Social History History  Substance Use Topics  . Smoking status: Never Smoker   . Smokeless tobacco: Not on file  . Alcohol Use: Yes     Comment: 2-3 glasses    No Known Allergies  Current Outpatient Prescriptions  Medication Sig Dispense Refill  . atorvastatin (LIPITOR) 10 MG tablet Take 10 mg by mouth daily.      . Biotin 1000 MCG tablet Take 1,000 mcg by mouth daily.      . Calcium Citrate (CITRACAL PO) Take by mouth daily.      . Cholecalciferol (VITAMIN D-3) 1000 UNITS CAPS Take 1,000 Units by mouth daily.      . cloNIDine (CATAPRES) 0.2 MG tablet Take 0.2 mg by mouth 2 (two) times daily.      . furosemide (LASIX) 40 MG tablet Take 40 mg by mouth daily.      . IRON PO Take 65 mg by mouth daily.      . metFORMIN (GLUCOPHAGE) 850 MG tablet Take 850 mg by mouth 2 (two) times daily with a meal.      . Multiple Vitamins-Minerals (MULTIVITAMIN PO) Take by mouth daily.      . traMADol (ULTRAM) 50 MG tablet Take 50 mg by mouth 2 (two) times daily.       . verapamil (VERELAN PM) 240 MG 24 hr capsule Take 240 mg by mouth at bedtime.       No current facility-administered medications for this visit.    Review of Systems Review of Systems  Constitutional: Negative for fever, chills and unexpected weight change.  HENT: Negative for congestion, hearing loss, sore throat, trouble swallowing and voice change.   Eyes: Negative for visual disturbance.  Respiratory: Positive for cough. Negative for wheezing.   Cardiovascular: Negative for chest pain, palpitations and leg swelling.  Gastrointestinal: Negative for nausea, vomiting, abdominal pain, diarrhea, constipation, blood in stool, abdominal distention and anal bleeding.  Genitourinary: Negative for hematuria, vaginal bleeding and difficulty urinating.  Musculoskeletal: Positive for arthralgias and back pain.  Skin: Negative for rash and wound.  Neurological: Negative for seizures, syncope and headaches.  Hematological: Negative for adenopathy. Does not bruise/bleed easily.  Psychiatric/Behavioral: Negative for confusion.    There were no vitals taken for this visit.  Physical Exam Physical Exam  Vitals reviewed. Constitutional: She appears well-developed and well-nourished.  Neck: Neck supple.  Cardiovascular: Normal rate, regular rhythm and normal heart sounds.     Pulmonary/Chest: Effort normal and breath sounds normal. She has no wheezes. Right breast exhibits no inverted nipple, no mass, no nipple discharge, no skin change and no tenderness. Left breast exhibits no inverted nipple, no mass, no nipple discharge, no skin change and no tenderness.  Lymphadenopathy:    She has no cervical adenopathy.    She has no axillary adenopathy.       Right: No supraclavicular adenopathy present.       Left: No supraclavicular adenopathy present.    Data Reviewed EXAM:  ULTRASOUND OF THE RIGHT BREAST  COMPARISON: Prior examinations.  FINDINGS:  On physical exam,there is no discrete palpable  abnormality within  the upper outer quadrant of the right breast.  Ultrasound is performed, showing an irregularly marginated  hypoechoic mass with angular margins and shadowing located within  the right breast at the 9:30 o'clock position 4 cm from the nipple.  This measures 1.3 x 1.2 x 1.0 cm in size and is worrisome for  invasive mammary carcinoma.  Ultrasound of the right axilla demonstrates an abnormal appearing  low axillary (level 1) lymph node with loss of normal fatty hilum.  This measures 1.6 x 1.4 x 0.8 cm in size.  I have discussed ultrasound-guided core biopsy of the right breast  mass and abnormal right axillary lymph node with the patient. This  will be scheduled.  IMPRESSION:  1.3 cm suspicious mass located within the right breast at the 9:30  o'clock position 4 cm from the nipple and abnormal appearing low  axillary (level 1) right axillary lymph node. Tissue sampling via  ultrasound-guided core biopsy of the breast mass and axillary lymph  node is recommended and has been scheduled for 03/17/2013.    Assessment    Clinical stage II right breast cancer    Plan    Right breast wire guided lumpectomy, right targeted axillary dissection    We discussed the staging and pathophysiology of breast cancer. We discussed all of the different options for treatment for breast cancer including surgery, chemotherapy, radiation therapy, Herceptin, and antiestrogen therapy.   We discussed a targeted axillary dissection including the abnormal node.  Will plan on wiring the node and removing.  We discussed risks of lymphedema and shoulder pain. We discussed the options for treatment of the breast cancer which included lumpectomy versus a mastectomy. We discussed the performance of the lumpectomy with a wire placement. We discussed a 5% chance of a positive margin requiring reexcision in the operating room. We also discussed that she may need radiation therapy or antiestrogen therapy or  both if she undergoes lumpectomy. We discussed the mastectomy and the postoperative care for that as well. We discussed that there is no difference in her survival whether she undergoes lumpectomy with radiation therapy or antiestrogen therapy versus a mastectomy.  We discussed the risks of operation including bleeding, infection, possible reoperation. She understands her further therapy will be based on what her stages at the time of her operation.     , 03/24/2013, 3:40 PM    

## 2013-03-26 ENCOUNTER — Encounter (HOSPITAL_COMMUNITY): Payer: Self-pay | Admitting: Pharmacy Technician

## 2013-03-29 ENCOUNTER — Encounter (HOSPITAL_COMMUNITY): Payer: Self-pay | Admitting: *Deleted

## 2013-03-29 ENCOUNTER — Telehealth: Payer: Self-pay | Admitting: *Deleted

## 2013-03-29 MED ORDER — CEFAZOLIN SODIUM-DEXTROSE 2-3 GM-% IV SOLR
2.0000 g | INTRAVENOUS | Status: AC
  Administered 2013-03-30: 2 g via INTRAVENOUS

## 2013-03-29 NOTE — Progress Notes (Signed)
Anesthesia:  Patient is a same day work-up for right breast needle localized lumpectomy for breast cancer tomorrow.  Records just received from PCP Dr. Felipa Eth before 5 PM. She has known HTN, CKD stage III/IV, DM2. BP significantly elevated at 200/82 on 03/24/13 (in Epic) but was 142/63 at her 10/16/12 office visit with Dr. Felipa Eth.  Her CMET and CBC from 03/24/13 noted.  Cr is stable since 10/16/12.  Her last EKG and CXR from [facility] were from 2013, so will need to be done on the day of surgery.  Her echo on 09/08/07 showed normal Lv size and function with mild LVH and diastolic relaxation abnormality, mild AV sclerosis without stenosis, mildly thickened MV leaflets with mild annular calcification and mild MR, mild TR, mild pulmonary hypertension.    Further evaluation by her assigned anesthesiologist on the day of surgery.  George Hugh [facility]/Anesthesiology Phone [phone] 03/29/2013 5:50 PM

## 2013-03-29 NOTE — Telephone Encounter (Signed)
Faxed Care Plan to Leigh at [facility] & to the PCP.  Took Care Plan to Med Rec to scan.  

## 2013-03-30 ENCOUNTER — Encounter: Payer: Self-pay | Admitting: *Deleted

## 2013-03-30 ENCOUNTER — Observation Stay (HOSPITAL_COMMUNITY)
Admission: RE | Admit: 2013-03-30 | Discharge: 2013-03-31 | Disposition: A | Discharge status: Home / Self Care | Payer: Medicare Other | Source: Ambulatory Visit | Attending: General Surgery | Admitting: General Surgery

## 2013-03-30 ENCOUNTER — Encounter (HOSPITAL_COMMUNITY)
Admission: RE | Disposition: A | Discharge status: Home / Self Care | Payer: Self-pay | Source: Ambulatory Visit | Attending: General Surgery

## 2013-03-30 ENCOUNTER — Ambulatory Visit
Admission: RE | Admit: 2013-03-30 | Discharge: 2013-03-30 | Disposition: A | Discharge status: Home / Self Care | Payer: Medicare Other | Source: Ambulatory Visit | Attending: General Surgery | Admitting: General Surgery

## 2013-03-30 ENCOUNTER — Other Ambulatory Visit (INDEPENDENT_AMBULATORY_CARE_PROVIDER_SITE_OTHER): Payer: Self-pay | Admitting: General Surgery

## 2013-03-30 ENCOUNTER — Ambulatory Visit (HOSPITAL_COMMUNITY): Payer: Medicare Other | Admitting: Vascular Surgery

## 2013-03-30 ENCOUNTER — Encounter (HOSPITAL_COMMUNITY): Payer: Self-pay | Admitting: *Deleted

## 2013-03-30 ENCOUNTER — Ambulatory Visit (HOSPITAL_COMMUNITY): Payer: Medicare Other

## 2013-03-30 ENCOUNTER — Encounter (HOSPITAL_COMMUNITY): Payer: Medicare Other | Admitting: Vascular Surgery

## 2013-03-30 DIAGNOSIS — C50919 Malignant neoplasm of unspecified site of unspecified female breast: Principal | ICD-10-CM | POA: Diagnosis present

## 2013-03-30 DIAGNOSIS — C50411 Malignant neoplasm of upper-outer quadrant of right female breast: Secondary | ICD-10-CM

## 2013-03-30 DIAGNOSIS — Z87891 Personal history of nicotine dependence: Secondary | ICD-10-CM | POA: Insufficient documentation

## 2013-03-30 DIAGNOSIS — M129 Arthropathy, unspecified: Secondary | ICD-10-CM | POA: Insufficient documentation

## 2013-03-30 DIAGNOSIS — C773 Secondary and unspecified malignant neoplasm of axilla and upper limb lymph nodes: Secondary | ICD-10-CM | POA: Insufficient documentation

## 2013-03-30 DIAGNOSIS — E119 Type 2 diabetes mellitus without complications: Secondary | ICD-10-CM | POA: Insufficient documentation

## 2013-03-30 DIAGNOSIS — I10 Essential (primary) hypertension: Secondary | ICD-10-CM | POA: Insufficient documentation

## 2013-03-30 HISTORY — PX: AXILLARY LYMPH NODE DISSECTION: SHX5229

## 2013-03-30 HISTORY — DX: Cardiac murmur, unspecified: R01.1

## 2013-03-30 HISTORY — PX: BREAST LUMPECTOMY: SHX2

## 2013-03-30 HISTORY — PX: BREAST LUMPECTOMY WITH NEEDLE LOCALIZATION: SHX5759

## 2013-03-30 LAB — GLUCOSE, CAPILLARY
Glucose-Capillary: 181 mg/dL — ABNORMAL HIGH (ref 70–99)
Glucose-Capillary: 143 mg/dL — ABNORMAL HIGH (ref 70–99)
Glucose-Capillary: 155 mg/dL — ABNORMAL HIGH (ref 70–99)
Glucose-Capillary: 188 mg/dL — ABNORMAL HIGH (ref 70–99)

## 2013-03-30 SURGERY — BREAST LUMPECTOMY WITH NEEDLE LOCALIZATION
Anesthesia: General | Site: Breast | Laterality: Right

## 2013-03-30 MED ORDER — PROPOFOL 10 MG/ML IV BOLUS
INTRAVENOUS | Status: DC | PRN
  Administered 2013-03-30: 50 mg via INTRAVENOUS
  Administered 2013-03-30: 150 mg via INTRAVENOUS

## 2013-03-30 MED ORDER — FUROSEMIDE 40 MG PO TABS
40.0000 mg | ORAL_TABLET | Freq: Every day | ORAL | Status: DC
  Administered 2013-03-31: 40 mg via ORAL
  Filled 2013-03-30: qty 1

## 2013-03-30 MED ORDER — VERAPAMIL HCL ER 240 MG PO TBCR
240.0000 mg | EXTENDED_RELEASE_TABLET | Freq: Every day | ORAL | Status: DC
  Administered 2013-03-31: 240 mg via ORAL
  Filled 2013-03-30: qty 1

## 2013-03-30 MED ORDER — SUCCINYLCHOLINE CHLORIDE 20 MG/ML IJ SOLN
INTRAMUSCULAR | Status: AC
  Filled 2013-03-30: qty 1

## 2013-03-30 MED ORDER — CLONIDINE HCL 0.2 MG PO TABS
0.2000 mg | ORAL_TABLET | Freq: Two times a day (BID) | ORAL | Status: DC
  Administered 2013-03-30 – 2013-03-31 (×2): 0.2 mg via ORAL
  Filled 2013-03-30 (×3): qty 1

## 2013-03-30 MED ORDER — LABETALOL HCL 5 MG/ML IV SOLN
5.0000 mg | Freq: Once | INTRAVENOUS | Status: AC
  Administered 2013-03-30: 5 mg via INTRAVENOUS

## 2013-03-30 MED ORDER — ONDANSETRON HCL 4 MG/2ML IJ SOLN
INTRAMUSCULAR | Status: DC | PRN
Start: 2013-03-30 — End: 2013-03-30
  Administered 2013-03-30: 4 mg via INTRAVENOUS

## 2013-03-30 MED ORDER — PROPOFOL 10 MG/ML IV BOLUS
INTRAVENOUS | Status: AC
  Filled 2013-03-30: qty 20

## 2013-03-30 MED ORDER — LACTATED RINGERS IV SOLN
INTRAVENOUS | Status: DC
  Administered 2013-03-30 (×2): via INTRAVENOUS

## 2013-03-30 MED ORDER — LIDOCAINE HCL (CARDIAC) 20 MG/ML IV SOLN
INTRAVENOUS | Status: DC | PRN
Start: 2013-03-30 — End: 2013-03-30
  Administered 2013-03-30: 60 mg via INTRAVENOUS

## 2013-03-30 MED ORDER — ONDANSETRON HCL 4 MG/2ML IJ SOLN
INTRAMUSCULAR | Status: AC
  Filled 2013-03-30: qty 2

## 2013-03-30 MED ORDER — BUPIVACAINE-EPINEPHRINE 0.25% -1:200000 IJ SOLN
INTRAMUSCULAR | Status: DC | PRN
  Administered 2013-03-30: 10 mL

## 2013-03-30 MED ORDER — HYDROMORPHONE HCL PF 1 MG/ML IJ SOLN
INTRAMUSCULAR | Status: AC
  Filled 2013-03-30: qty 1

## 2013-03-30 MED ORDER — INSULIN ASPART 100 UNIT/ML ~~LOC~~ SOLN
0.0000 [IU] | Freq: Three times a day (TID) | SUBCUTANEOUS | Status: DC
  Administered 2013-03-31 (×2): 3 [IU] via SUBCUTANEOUS

## 2013-03-30 MED ORDER — ONDANSETRON HCL 4 MG/2ML IJ SOLN: 4.0000 mg | Freq: Four times a day (QID) | INTRAMUSCULAR | Status: DC | PRN

## 2013-03-30 MED ORDER — TRAMADOL HCL 50 MG PO TABS
50.0000 mg | ORAL_TABLET | Freq: Two times a day (BID) | ORAL | Status: DC
  Administered 2013-03-30 – 2013-03-31 (×2): 50 mg via ORAL
  Filled 2013-03-30 (×2): qty 1

## 2013-03-30 MED ORDER — LIDOCAINE HCL (CARDIAC) 20 MG/ML IV SOLN
INTRAVENOUS | Status: AC
  Filled 2013-03-30: qty 5

## 2013-03-30 MED ORDER — FENTANYL CITRATE 0.05 MG/ML IJ SOLN
INTRAMUSCULAR | Status: AC
  Filled 2013-03-30: qty 5

## 2013-03-30 MED ORDER — ROCURONIUM BROMIDE 50 MG/5ML IV SOLN
INTRAVENOUS | Status: AC
  Filled 2013-03-30: qty 1

## 2013-03-30 MED ORDER — LABETALOL HCL 5 MG/ML IV SOLN
INTRAVENOUS | Status: AC
Start: 2013-03-30 — End: 2013-03-31
  Filled 2013-03-30: qty 4

## 2013-03-30 MED ORDER — CEFAZOLIN SODIUM-DEXTROSE 2-3 GM-% IV SOLR
INTRAVENOUS | Status: AC
  Filled 2013-03-30: qty 50

## 2013-03-30 MED ORDER — HYDROCODONE-ACETAMINOPHEN 5-325 MG PO TABS
1.0000 | ORAL_TABLET | ORAL | Status: DC | PRN
  Administered 2013-03-31 (×2): 1 via ORAL
  Filled 2013-03-30 (×2): qty 1

## 2013-03-30 MED ORDER — SODIUM CHLORIDE 0.9 % IV SOLN
INTRAVENOUS | Status: DC
  Administered 2013-03-30: 22:00:00 via INTRAVENOUS

## 2013-03-30 MED ORDER — MORPHINE SULFATE 2 MG/ML IJ SOLN
1.0000 mg | INTRAMUSCULAR | Status: DC | PRN
  Administered 2013-03-30: 1 mg via INTRAVENOUS
  Filled 2013-03-30: qty 1

## 2013-03-30 MED ORDER — 0.9 % SODIUM CHLORIDE (POUR BTL) OPTIME
TOPICAL | Status: DC | PRN
  Administered 2013-03-30: 1000 mL

## 2013-03-30 MED ORDER — FENTANYL CITRATE 0.05 MG/ML IJ SOLN
INTRAMUSCULAR | Status: DC | PRN
  Administered 2013-03-30 (×2): 25 ug via INTRAVENOUS
  Administered 2013-03-30: 50 ug via INTRAVENOUS
  Administered 2013-03-30 (×3): 25 ug via INTRAVENOUS

## 2013-03-30 MED ORDER — ACETAMINOPHEN 325 MG PO TABS: 650.0000 mg | ORAL_TABLET | Freq: Four times a day (QID) | ORAL | Status: DC | PRN

## 2013-03-30 MED ORDER — HYDROMORPHONE HCL PF 1 MG/ML IJ SOLN
0.2500 mg | INTRAMUSCULAR | Status: DC | PRN
  Administered 2013-03-30: 0.5 mg via INTRAVENOUS

## 2013-03-30 MED ORDER — ACETAMINOPHEN 650 MG RE SUPP
650.0000 mg | Freq: Four times a day (QID) | RECTAL | Status: DC | PRN
Start: 2013-03-30 — End: 2013-03-31

## 2013-03-30 MED ORDER — VERAPAMIL HCL ER 240 MG PO CP24: 240.0000 mg | ORAL_CAPSULE | Freq: Every day | ORAL | Status: DC

## 2013-03-30 SURGICAL SUPPLY — 63 items
APPLIER CLIP 9.375 MED OPEN (MISCELLANEOUS) ×3
BENZOIN TINCTURE PRP APPL 2/3 (GAUZE/BANDAGES/DRESSINGS) ×3 IMPLANT
BINDER BREAST LRG (GAUZE/BANDAGES/DRESSINGS) IMPLANT
BINDER BREAST XLRG (GAUZE/BANDAGES/DRESSINGS) ×3 IMPLANT
BLADE SURG 10 STRL SS (BLADE) ×3 IMPLANT
BLADE SURG 15 STRL LF DISP TIS (BLADE) ×2 IMPLANT
BLADE SURG 15 STRL SS (BLADE) ×1
BLADE SURG ROTATE 9660 (MISCELLANEOUS) IMPLANT
CANISTER SUCTION 2500CC (MISCELLANEOUS) ×3 IMPLANT
CHLORAPREP W/TINT 26ML (MISCELLANEOUS) ×3 IMPLANT
CLIP APPLIE 9.375 MED OPEN (MISCELLANEOUS) ×2 IMPLANT
CLSR STERI-STRIP ANTIMIC 1/2X4 (GAUZE/BANDAGES/DRESSINGS) ×3 IMPLANT
CONT SPEC 4OZ CLIKSEAL STRL BL (MISCELLANEOUS) ×9 IMPLANT
CONT SPEC STER OR (MISCELLANEOUS) IMPLANT
COVER SURGICAL LIGHT HANDLE (MISCELLANEOUS) ×3 IMPLANT
DECANTER SPIKE VIAL GLASS SM (MISCELLANEOUS) ×3 IMPLANT
DERMABOND ADVANCED (GAUZE/BANDAGES/DRESSINGS) ×1
DERMABOND ADVANCED .7 DNX12 (GAUZE/BANDAGES/DRESSINGS) ×2 IMPLANT
DEVICE DUBIN SPECIMEN MAMMOGRA (MISCELLANEOUS) ×3 IMPLANT
DRAIN CHANNEL 19F RND (DRAIN) ×3 IMPLANT
DRAPE CHEST BREAST 15X10 FENES (DRAPES) ×3 IMPLANT
DRAPE PED LAPAROTOMY (DRAPES) ×3 IMPLANT
DRSG TEGADERM 4X4.75 (GAUZE/BANDAGES/DRESSINGS) ×3 IMPLANT
ELECT CAUTERY BLADE 6.4 (BLADE) ×3 IMPLANT
ELECT REM PT RETURN 9FT ADLT (ELECTROSURGICAL) ×3
ELECTRODE REM PT RTRN 9FT ADLT (ELECTROSURGICAL) ×2 IMPLANT
EVACUATOR SILICONE 100CC (DRAIN) ×3 IMPLANT
GAUZE SPONGE 4X4 16PLY XRAY LF (GAUZE/BANDAGES/DRESSINGS) ×3 IMPLANT
GLOVE BIO SURGEON STRL SZ7 (GLOVE) ×3 IMPLANT
GLOVE BIOGEL PI IND STRL 7.5 (GLOVE) ×2 IMPLANT
GLOVE BIOGEL PI INDICATOR 7.5 (GLOVE) ×1
GOWN STRL NON-REIN LRG LVL3 (GOWN DISPOSABLE) ×6 IMPLANT
KIT BASIN OR (CUSTOM PROCEDURE TRAY) ×3 IMPLANT
KIT MARKER MARGIN INK (KITS) ×3 IMPLANT
KIT ROOM TURNOVER OR (KITS) ×3 IMPLANT
NEEDLE HYPO 25GX1X1/2 BEV (NEEDLE) ×3 IMPLANT
NS IRRIG 1000ML POUR BTL (IV SOLUTION) ×3 IMPLANT
PACK SURGICAL SETUP 50X90 (CUSTOM PROCEDURE TRAY) ×3 IMPLANT
PAD ARMBOARD 7.5X6 YLW CONV (MISCELLANEOUS) ×6 IMPLANT
PENCIL BUTTON HOLSTER BLD 10FT (ELECTRODE) ×3 IMPLANT
SPONGE GAUZE 4X4 12PLY (GAUZE/BANDAGES/DRESSINGS) ×3 IMPLANT
SPONGE LAP 18X18 X RAY DECT (DISPOSABLE) ×3 IMPLANT
SPONGE LAP 4X18 X RAY DECT (DISPOSABLE) ×3 IMPLANT
STAPLER VISISTAT 35W (STAPLE) ×3 IMPLANT
STOCKINETTE IMPERVIOUS 9X36 MD (GAUZE/BANDAGES/DRESSINGS) ×3 IMPLANT
SUT ETHILON 2 0 FS 18 (SUTURE) ×3 IMPLANT
SUT ETHILON 3 0 PS 1 (SUTURE) ×3 IMPLANT
SUT MNCRL AB 4-0 PS2 18 (SUTURE) ×3 IMPLANT
SUT SILK 2 0 SH (SUTURE) IMPLANT
SUT SILK 3 0 (SUTURE) ×2
SUT SILK 3-0 18XBRD TIE 12 (SUTURE) ×4 IMPLANT
SUT VIC AB 2-0 SH 27 (SUTURE) ×3
SUT VIC AB 2-0 SH 27X BRD (SUTURE) ×2 IMPLANT
SUT VIC AB 2-0 SH 27XBRD (SUTURE) ×4 IMPLANT
SUT VIC AB 3-0 SH 27 (SUTURE) ×3
SUT VIC AB 3-0 SH 27X BRD (SUTURE) ×6 IMPLANT
SYR BULB 3OZ (MISCELLANEOUS) ×3 IMPLANT
SYR CONTROL 10ML LL (SYRINGE) ×3 IMPLANT
TOWEL OR 17X24 6PK STRL BLUE (TOWEL DISPOSABLE) ×3 IMPLANT
TOWEL OR 17X26 10 PK STRL BLUE (TOWEL DISPOSABLE) ×3 IMPLANT
TUBE CONNECTING 12X1/4 (SUCTIONS) ×3 IMPLANT
WATER STERILE IRR 1000ML POUR (IV SOLUTION) IMPLANT
YANKAUER SUCT BULB TIP NO VENT (SUCTIONS) ×3 IMPLANT

## 2013-03-30 NOTE — Anesthesia Postprocedure Evaluation (Signed)
  Anesthesia Post-op Note  Patient: Sabrina [name]  Procedure(s) Performed: Procedure(s): BREAST LUMPECTOMY WITH NEEDLE LOCALIZATION (Right) AXILLARY LYMPH NODE DISSECTION (Right)  Patient Location: PACU  Anesthesia Type:General  Level of Consciousness: awake  Airway and Oxygen Therapy: Patient Spontanous Breathing  Post-op Pain: mild  Post-op Assessment: Post-op Vital signs reviewed  Post-op Vital Signs: Reviewed  Complications: No apparent anesthesia complications

## 2013-03-30 NOTE — Anesthesia Procedure Notes (Signed)
Procedure Name: LMA Insertion Date/Time: 03/30/2013 3:51 PM Performed by: Carola Frost Pre-anesthesia Checklist: Patient identified, Timeout performed, Emergency Drugs available, Suction available and Patient being monitored Patient Re-evaluated:Patient Re-evaluated prior to inductionOxygen Delivery Method: Circle system utilized Preoxygenation: Pre-oxygenation with 100% oxygen Intubation Type: IV induction LMA: LMA inserted LMA Size: 4.0 Number of attempts: 1 Placement Confirmation: positive ETCO2 and breath sounds checked- equal and bilateral Tube secured with: Tape Dental Injury: Teeth and Oropharynx as per pre-operative assessment

## 2013-03-30 NOTE — Interval H&P Note (Signed)
History and Physical Interval Note:  03/30/2013 3:05 PM  Sabrina [name]  has presented today for surgery, with the diagnosis of right breast cancer   The various methods of treatment have been discussed with the patient and family. After consideration of risks, benefits and other options for treatment, the patient has consented to  Procedure(s): BREAST LUMPECTOMY WITH NEEDLE LOCALIZATION (Right) AXILLARY LYMPH NODE DISSECTION (Right) as a surgical intervention .  The patient's history has been reviewed, patient examined, no change in status, stable for surgery.  I have reviewed the patient's chart and labs.  Questions were answered to the patient's satisfaction.     Wave Calzada

## 2013-03-30 NOTE — Transfer of Care (Signed)
Immediate Anesthesia Transfer of Care Note  Patient: Sabrina [name]  Procedure(s) Performed: Procedure(s): BREAST LUMPECTOMY WITH NEEDLE LOCALIZATION (Right) AXILLARY LYMPH NODE DISSECTION (Right)  Patient Location: PACU  Anesthesia Type:General  Level of Consciousness: awake and alert   Airway & Oxygen Therapy: Patient Spontanous Breathing and Patient connected to nasal cannula oxygen  Post-op Assessment: Report given to PACU RN, Post -op Vital signs reviewed and stable and Patient moving all extremities X 4  Post vital signs: Reviewed and stable  Complications: No apparent anesthesia complications

## 2013-03-30 NOTE — Anesthesia Preprocedure Evaluation (Addendum)
Anesthesia Evaluation  Patient identified by MRN, date of birth, ID band Patient awake    Reviewed: Allergy & Precautions, H&P , NPO status , Patient's Chart, lab work & pertinent test results  Airway Mallampati: II TM Distance: >3 FB Neck ROM: Full    Dental  (+) Dental Advisory Given, Partial Lower and Teeth Intact   Pulmonary former smoker,          Cardiovascular hypertension, Pt. on medications     Neuro/Psych    GI/Hepatic   Endo/Other  diabetes, Oral Hypoglycemic AgentsMorbid obesity  Renal/GU      Musculoskeletal  (+) Arthritis -,   Abdominal   Peds  Hematology   Anesthesia Other Findings   Reproductive/Obstetrics                          Anesthesia Physical Anesthesia Plan  ASA: II  Anesthesia Plan: General   Post-op Pain Management:    Induction: Intravenous  Airway Management Planned: LMA  Additional Equipment:   Intra-op Plan:   Post-operative Plan: Extubation in OR  Informed Consent: I have reviewed the patients History and Physical, chart, labs and discussed the procedure including the risks, benefits and alternatives for the proposed anesthesia with the patient or authorized representative who has indicated his/her understanding and acceptance.   Dental advisory given  Plan Discussed with: Anesthesiologist and Surgeon  Anesthesia Plan Comments:         Anesthesia Quick Evaluation

## 2013-03-30 NOTE — Preoperative (Signed)
Beta Blockers   Reason not to administer Beta Blockers:Not Applicable 

## 2013-03-30 NOTE — Op Note (Signed)
Preoperative diagnosis: Clinical stage II right breast cancer Postoperative diagnosis: Same as above Procedure: #1 right breast wire-guided lumpectomy 2 targeted right axillary lymph node dissection Surgeon: Dr. Serita Grammes Anesthesia: Gen. Estimated blood loss: 20 cc Specimens: #1 right breast marked with paint #2 additional superior margin marked short superior, long lateral, double deep #3 additional lateral margin marked as above #4 right axillary contents Disposition to recovery stable Sponge needle count was correct at completion  Indications: This is an 80 year old female who presented with a mammographically found right breast cancer. She underwent biopsy showing invasive mammary carcinoma right breast as well as a single node that was abnormal and positive for metastatic carcinoma. We discussed all of her options at the multidisciplinary clinic. She has elected to undergo lumpectomy. We also discussed a targeted axillary lymph node dissection.  Procedure: After informed consent was obtained the patient was first taken to the breast center. She had a wire placed in the positive node as well as in the breast lesion. I had these mammograms in the operating room. She was given cefazolin. Sequential compression devices were on her legs. She was placed under general anesthesia without complication. Her right breast and axilla were then prepped and draped in the standard sterile surgical fashion. A surgical timeout was then performed.  I made a curvilinear incision overlying the breast lesion. I then used cautery to remove the lesion as well as some surrounding tissue with an attempt to get a clear margin. The wire was brought in to the specimen from the skin.  A mammogram was taken after I painted the specimen and confirmed removal of the clip, lesion, and wire. I thought 2 margins might have been close so I excised an additional lateral and an additional superior margin and marked them as  above. I placed clips in the cavity. I then obtained hemostasis. I closed the cavity with 2-0 Vicryl. The dermis was closed with 3-0 Vicryl. The skin was closed with 4-0 Monocryl. Dermabond was placed over this and Marcaine was infiltrated throughout.  I then made an incision below the axillary hairline. I traced the wire into what were several palpable axillary nodes. I used a combination of cautery and blunt dissection to discussed a packet of nodes that were enlarged. These were the low lying axillary nodes. There were no other palpable nodes that were present. I did place a couple of clips on some small vessels in the axilla. Hemostasis was observed at completion. I did decide to place a drain. A 19 [place] Blake drain was placed and secured with a 2-0 nylon suture. I then closed the axillary fascia with 2-0 Vicryl. The dermis with 3-0 Vicryl. Skin was closed for Monocryl and Steri-Strips a sterile dressing were placed. A breast binder was placed. She tolerated all this well and was transferred to recovery stable.

## 2013-03-30 NOTE — H&P (View-Only) (Signed)
Patient ID: Sabrina [name], female   DOB: 06/21/1926, 80 y.o.   MRN: [id number]  Chief Complaint  Patient presents with  . Other    HPI Sabrina [name] is a 80 y.o. female.  Referred by Dr Hal Stoneking HPI 86 yof who has no complaints referable to either breast who presents after undergoing a screening mm that shows a right breast mass.  Ultrasound shows a 1.3x1.2x1 cm abnormal appearing lymph node.  She underwent a biopsy of the node that was positive for cancer.  Biopsy of the breast mass shows an invasive ductal carcinoma that is 100% er and pr positive, her2 equivocal, Ki67 is 15%.  She comes in to multidisciplinary clinic to discuss her options.  Past Medical History  Diagnosis Date  . Diabetes mellitus without complication   . Hypertension   . Arthritis     Past Surgical History  Procedure Laterality Date  . Tonsillectomy    . Breast surgery      right breast biopsy benign    History reviewed. No pertinent family history.  Social History History  Substance Use Topics  . Smoking status: Never Smoker   . Smokeless tobacco: Not on file  . Alcohol Use: Yes     Comment: 2-3 glasses    No Known Allergies  Current Outpatient Prescriptions  Medication Sig Dispense Refill  . atorvastatin (LIPITOR) 10 MG tablet Take 10 mg by mouth daily.      . Biotin 1000 MCG tablet Take 1,000 mcg by mouth daily.      . Calcium Citrate (CITRACAL PO) Take by mouth daily.      . Cholecalciferol (VITAMIN D-3) 1000 UNITS CAPS Take 1,000 Units by mouth daily.      . cloNIDine (CATAPRES) 0.2 MG tablet Take 0.2 mg by mouth 2 (two) times daily.      . furosemide (LASIX) 40 MG tablet Take 40 mg by mouth daily.      . IRON PO Take 65 mg by mouth daily.      . metFORMIN (GLUCOPHAGE) 850 MG tablet Take 850 mg by mouth 2 (two) times daily with a meal.      . Multiple Vitamins-Minerals (MULTIVITAMIN PO) Take by mouth daily.      . traMADol (ULTRAM) 50 MG tablet Take 50 mg by mouth 2 (two) times daily.       . verapamil (VERELAN PM) 240 MG 24 hr capsule Take 240 mg by mouth at bedtime.       No current facility-administered medications for this visit.    Review of Systems Review of Systems  Constitutional: Negative for fever, chills and unexpected weight change.  HENT: Negative for congestion, hearing loss, sore throat, trouble swallowing and voice change.   Eyes: Negative for visual disturbance.  Respiratory: Positive for cough. Negative for wheezing.   Cardiovascular: Negative for chest pain, palpitations and leg swelling.  Gastrointestinal: Negative for nausea, vomiting, abdominal pain, diarrhea, constipation, blood in stool, abdominal distention and anal bleeding.  Genitourinary: Negative for hematuria, vaginal bleeding and difficulty urinating.  Musculoskeletal: Positive for arthralgias and back pain.  Skin: Negative for rash and wound.  Neurological: Negative for seizures, syncope and headaches.  Hematological: Negative for adenopathy. Does not bruise/bleed easily.  Psychiatric/Behavioral: Negative for confusion.    There were no vitals taken for this visit.  Physical Exam Physical Exam  Vitals reviewed. Constitutional: She appears well-developed and well-nourished.  Neck: Neck supple.  Cardiovascular: Normal rate, regular rhythm and normal heart sounds.     Pulmonary/Chest: Effort normal and breath sounds normal. She has no wheezes. Right breast exhibits no inverted nipple, no mass, no nipple discharge, no skin change and no tenderness. Left breast exhibits no inverted nipple, no mass, no nipple discharge, no skin change and no tenderness.  Lymphadenopathy:    She has no cervical adenopathy.    She has no axillary adenopathy.       Right: No supraclavicular adenopathy present.       Left: No supraclavicular adenopathy present.    Data Reviewed EXAM:  ULTRASOUND OF THE RIGHT BREAST  COMPARISON: Prior examinations.  FINDINGS:  On physical exam,there is no discrete palpable  abnormality within  the upper outer quadrant of the right breast.  Ultrasound is performed, showing an irregularly marginated  hypoechoic mass with angular margins and shadowing located within  the right breast at the 9:30 o'clock position 4 cm from the nipple.  This measures 1.3 x 1.2 x 1.0 cm in size and is worrisome for  invasive mammary carcinoma.  Ultrasound of the right axilla demonstrates an abnormal appearing  low axillary (level 1) lymph node with loss of normal fatty hilum.  This measures 1.6 x 1.4 x 0.8 cm in size.  I have discussed ultrasound-guided core biopsy of the right breast  mass and abnormal right axillary lymph node with the patient. This  will be scheduled.  IMPRESSION:  1.3 cm suspicious mass located within the right breast at the 9:30  o'clock position 4 cm from the nipple and abnormal appearing low  axillary (level 1) right axillary lymph node. Tissue sampling via  ultrasound-guided core biopsy of the breast mass and axillary lymph  node is recommended and has been scheduled for 03/17/2013.    Assessment    Clinical stage II right breast cancer    Plan    Right breast wire guided lumpectomy, right targeted axillary dissection    We discussed the staging and pathophysiology of breast cancer. We discussed all of the different options for treatment for breast cancer including surgery, chemotherapy, radiation therapy, Herceptin, and antiestrogen therapy.   We discussed a targeted axillary dissection including the abnormal node.  Will plan on wiring the node and removing.  We discussed risks of lymphedema and shoulder pain. We discussed the options for treatment of the breast cancer which included lumpectomy versus a mastectomy. We discussed the performance of the lumpectomy with a wire placement. We discussed a 5% chance of a positive margin requiring reexcision in the operating room. We also discussed that she may need radiation therapy or antiestrogen therapy or  both if she undergoes lumpectomy. We discussed the mastectomy and the postoperative care for that as well. We discussed that there is no difference in her survival whether she undergoes lumpectomy with radiation therapy or antiestrogen therapy versus a mastectomy.  We discussed the risks of operation including bleeding, infection, possible reoperation. She understands her further therapy will be based on what her stages at the time of her operation.     Augusto Deckman 03/24/2013, 3:40 PM    

## 2013-03-31 ENCOUNTER — Telehealth: Payer: Self-pay | Admitting: Oncology

## 2013-03-31 LAB — GLUCOSE, CAPILLARY
Glucose-Capillary: 152 mg/dL — ABNORMAL HIGH (ref 70–99)
Glucose-Capillary: 184 mg/dL — ABNORMAL HIGH (ref 70–99)

## 2013-03-31 MED ORDER — METFORMIN HCL 850 MG PO TABS: 850.0000 mg | ORAL_TABLET | Freq: Two times a day (BID) | ORAL | Status: DC

## 2013-03-31 MED ORDER — HYDROCODONE-ACETAMINOPHEN 5-325 MG PO TABS: 1.0000 | ORAL_TABLET | ORAL | Status: DC | PRN

## 2013-03-31 NOTE — Progress Notes (Signed)
PHARMACIST - PHYSICIAN COMMUNICATION DR:  Donne Hazel CONCERNING:  METFORMIN SAFE ADMINISTRATION POLICY  RECOMMENDATION: Metformin has been placed on DISCONTINUE (rejected order) STATUS and should be reordered only after any of the conditions below are ruled out.  Current safety recommendations include avoiding metformin for a minimum of 48 hours after the patient's exposure to intravenous contrast media.  DESCRIPTION:  The Pharmacy Committee has adopted a policy that restricts the use of metformin in hospitalized patients until all the contraindications to administration have been ruled out. Specific contraindications are: []  Serum creatinine ? 1.5 for males [x]  Serum creatinine ? 1.4 for females []  Shock, acute MI, sepsis, hypoxemia, dehydration []  Planned administration of intravenous iodinated contrast media []  Heart Failure patients with low EF []  Acute or chronic metabolic acidosis (including DKA)     Sloan Leiter, PharmD, BCPS Clinical Pharmacist [phone] 03/31/2013, 8:41 AM

## 2013-03-31 NOTE — Progress Notes (Signed)
1 Day Post-Op  Subjective: Feels well, pain controlled, hasnt eaten yet, not out of bed yet  Objective: Vital signs in last 24 hours: Temp:  [96.9 F (36.1 C)-98.6 F (37 C)] 98.6 F (37 C) (01/28 0514) Pulse Rate:  [65-94] 79 (01/28 0514) Resp:  [13-27] 18 (01/28 0514) BP: (137-196)/(52-77) 137/67 mmHg (01/28 0514) SpO2:  [91 %-100 %] 96 % (01/28 0514) Weight:  [182 lb 1 oz (82.583 kg)-184 lb 3.2 oz (83.553 kg)] 184 lb 3.2 oz (83.553 kg) (01/27 2055)    Intake/Output from previous day: 01/27 0701 - 01/28 0700 In: 1000 [I.V.:1000] Out: 825 [Urine:800; Drains:5; Blood:20] Intake/Output this shift:    General appearance: no distress Incision/Wound:breast wound clean without infection, drain with minimal output, axillary dressing dry   Studies/Results: Dg Chest 2 View  03/30/2013   CLINICAL DATA:  PREOP EVALUATION, RIGHT BREAST SURGERY  EXAM: CHEST  2 VIEW  COMPARISON:  DG CHEST 2V dated 02/19/2012  FINDINGS: The heart size and mediastinal contours are within normal limits. Both lungs are clear. The visualized skeletal structures are unremarkable.  IMPRESSION: No active cardiopulmonary disease.   Electronically Signed   By: Margaree Mackintosh M.D.   On: 03/30/2013 15:02   Mm Rt Plc Breast Loc Dev   1st Lesion  Inc Mammo Guide  03/30/2013   CLINICAL DATA:  Patient with right breast carcinoma and positive right axillary lymph node.  EXAM: ULTRASOUND NEEDLE LOCALIZATION OF THE RIGHT BREAST AND RIGHT AXILLA  COMPARISON:  Previous exams.  FINDINGS: Patient presents for needle localization prior to lumpectomy and right axillary lymph node excision. I met with the patient and we discussed the procedure of needle localization including benefits and alternatives. We discussed the high likelihood of a successful procedure. We discussed the risks of the procedure, including infection, bleeding, tissue injury, and further surgery. Informed, written consent was given.  The usual time-out protocol was  performed immediately prior to the procedure.  Using ultrasound guidance, sterile technique, 2% lidocaine and a 7 cm modified Kopans needle was used to localize the abnormal right axillary lymph node and a 5 cm modified Kopan's needle was used to localize the right breast mass at 9 o'clock. A lateral to medial approach was used for both the node and mass. The films are marked for Dr. Donne Hazel.  Specimen radiograph is performed and confirms the clip and hookwire to be present in the tissue sample. The specimen is marked for pathology. The localized axillary lymph node specimen was not radiographed.  IMPRESSION: Needle localization of the right breast and an abnormal right axillary lymph node. No apparent complications.   Electronically Signed   By: Donavan Burnet M.D.   On: 03/30/2013 16:48   [place] Rt Plc Breast Loc Dev   1st Lesion  Inc US Guide  03/30/2013   CLINICAL DATA:  Patient with right breast carcinoma and positive right axillary lymph node.  EXAM: ULTRASOUND NEEDLE LOCALIZATION OF THE RIGHT BREAST AND RIGHT AXILLA  COMPARISON:  Previous exams.  FINDINGS: Patient presents for needle localization prior to lumpectomy and right axillary lymph node excision. I met with the patient and we discussed the procedure of needle localization including benefits and alternatives. We discussed the high likelihood of a successful procedure. We discussed the risks of the procedure, including infection, bleeding, tissue injury, and further surgery. Informed, written consent was given.  The usual time-out protocol was performed immediately prior to the procedure.  Using ultrasound guidance, sterile technique, 2% lidocaine and a 7  cm modified Kopans needle was used to localize the abnormal right axillary lymph node and a 5 cm modified Kopan's needle was used to localize the right breast mass at 9 o'clock. A lateral to medial approach was used for both the node and mass. The films are marked for Dr. Donne Hazel.  Specimen  radiograph is performed and confirms the clip and hookwire to be present in the tissue sample. The specimen is marked for pathology. The localized axillary lymph node specimen was not radiographed.  IMPRESSION: Needle localization of the right breast and an abnormal right axillary lymph node. No apparent complications.   Electronically Signed   By: Donavan Burnet M.D.   On: 03/30/2013 16:48   [place] Rt Plc Breast Loc Dev   1st Lesion  Inc US Guide  03/30/2013   CLINICAL DATA:  Patient with right breast carcinoma and positive right axillary lymph node.  EXAM: ULTRASOUND NEEDLE LOCALIZATION OF THE RIGHT BREAST AND RIGHT AXILLA  COMPARISON:  Previous exams.  FINDINGS: Patient presents for needle localization prior to lumpectomy and right axillary lymph node excision. I met with the patient and we discussed the procedure of needle localization including benefits and alternatives. We discussed the high likelihood of a successful procedure. We discussed the risks of the procedure, including infection, bleeding, tissue injury, and further surgery. Informed, written consent was given.  The usual time-out protocol was performed immediately prior to the procedure.  Using ultrasound guidance, sterile technique, 2% lidocaine and a 7 cm modified Kopans needle was used to localize the abnormal right axillary lymph node and a 5 cm modified Kopan's needle was used to localize the right breast mass at 9 o'clock. A lateral to medial approach was used for both the node and mass. The films are marked for Dr. Donne Hazel.  Specimen radiograph is performed and confirms the clip and hookwire to be present in the tissue sample. The specimen is marked for pathology. The localized axillary lymph node specimen was not radiographed.  IMPRESSION: Needle localization of the right breast and an abnormal right axillary lymph node. No apparent complications.   Electronically Signed   By: Donavan Burnet M.D.   On: 03/30/2013 16:48     Anti-infectives: Anti-infectives   Start     Dose/Rate Route Frequency Ordered Stop   03/30/13 0600  ceFAZolin (ANCEF) IVPB 2 g/50 mL premix     2 g 100 mL/hr over 30 Minutes Intravenous On call to O.R. 03/29/13 1417 03/30/13 1558      Assessment/Plan: Pod 1 right lumpectomy, targeted alnd  Oob, pt evaluation for safety, if tolerates diet, cleared can dc home later today with drain Restart all home meds   [facility] 03/31/2013

## 2013-03-31 NOTE — Progress Notes (Signed)
UR completed 

## 2013-03-31 NOTE — Discharge Instructions (Signed)
[facility] °Office Phone Number [phone] ° °BREAST BIOPSY/ PARTIAL MASTECTOMY: POST OP INSTRUCTIONS ° °Always review your discharge instruction sheet given to you by the facility where your surgery was performed. ° °IF YOU HAVE DISABILITY OR FAMILY LEAVE FORMS, YOU MUST BRING THEM TO THE OFFICE FOR PROCESSING.  DO NOT GIVE THEM TO YOUR DOCTOR. ° °1. A prescription for pain medication may be given to you upon discharge.  Take your pain medication as prescribed, if needed.  If narcotic pain medicine is not needed, then you may take acetaminophen (Tylenol), naprosyn (Alleve) or ibuprofen (Advil) as needed. °2. Take your usually prescribed medications unless otherwise directed °3. If you need a refill on your pain medication, please contact your pharmacy.  They will contact our office to request authorization.  Prescriptions will not be filled after 5pm or on week-ends. °4. You should eat very light the first 24 hours after surgery, such as soup, crackers, pudding, etc.  Resume your normal diet the day after surgery. °5. Most patients will experience some swelling and bruising in the breast.  Ice packs and a good support bra will help.  Wear the breast binder provided or a sports bra for 72 hours day and night.  After that wear a sports bra during the day until you return to the office. Swelling and bruising can take several days to resolve.  °6. It is common to experience some constipation if taking pain medication after surgery.  Increasing fluid intake and taking a stool softener will usually help or prevent this problem from occurring.  A mild laxative (Milk of Magnesia or Miralax) should be taken according to package directions if there are no bowel movements after 48 hours. °7. Unless discharge instructions indicate otherwise, you may remove your bandages 48 hours after surgery and you may shower at that time.  You may have steri-strips (small skin tapes) in place directly over the incision.   These strips should be left on the skin for 7-10 days and will come off on their own.  If your surgeon used skin glue on the incision, you may shower in 24 hours.  The glue will flake off over the next 2-3 weeks.  Any sutures or staples will be removed at the office during your follow-up visit. °8. ACTIVITIES:  You may resume regular daily activities (gradually increasing) beginning the next day.  Wearing a good support bra or sports bra minimizes pain and swelling.  You may have sexual intercourse when it is comfortable. °a. You may drive when you no longer are taking prescription pain medication, you can comfortably wear a seatbelt, and you can safely maneuver your car and apply brakes. °b. RETURN TO WORK:  ______________________________________________________________________________________ °9. You should see your doctor in the office for a follow-up appointment approximately two weeks after your surgery.  Your doctor’s nurse will typically make your follow-up appointment when she calls you with your pathology report.  Expect your pathology report 3-4 business days after your surgery.  You may call to check if you do not hear from us after three days. °10. OTHER INSTRUCTIONS: _______________________________________________________________________________________________ _____________________________________________________________________________________________________________________________________ °_____________________________________________________________________________________________________________________________________ °_____________________________________________________________________________________________________________________________________ ° °WHEN TO CALL DR Edy Mcbane: °1. Fever over 101.0 °2. Nausea and/or vomiting. °3. Extreme swelling or bruising. °4. Continued bleeding from incision. °5. Increased pain, redness, or drainage from the incision. ° °The clinic staff is available to  answer your questions during regular business hours.  Please don’t hesitate to call and ask to speak to one of the nurses for   clinical concerns.  If you have a medical emergency, go to the nearest emergency room or call 911.  A surgeon from [facility] is always on call at the hospital.  For further questions, please visit [email or web address] Bulb Drain Home Care A bulb drain consists of a thin rubber tube and a soft, round bulb that creates a gentle suction. The rubber tube is placed in the area where you had surgery. A bulb is attached to the end of the tube that is outside the body. The bulb drain removes excess fluid that normally builds up in a surgical wound after surgery. The color and amount of fluid will vary. Immediately after surgery, the fluid is bright red and is a little thicker than water. It may gradually change to a yellow or pink color and become more thin and water-like. When the amount decreases to about 1 or 2 tbsp in 24 hours, your health care provider will usually remove it. DAILY CARE  Keep the bulb flat (compressed) at all times, except while emptying it. The flatness creates suction. You can flatten the bulb by squeezing it firmly in the middle and then closing the cap.  Keep sites where the tube enters the skin dry and covered with a bandage (dressing).  Secure the tube 1 2 in (2.5 5.1 cm) below the insertion sites, to keep it from pulling on your stitches. The tube is stitched in place and will not slip out.  Secure the bulb as directed by your health care provider.  For the first 3 days after surgery, there usually is more fluid in the bulb. Empty the bulb whenever it becomes half full because the bulb does not create enough suction if it is too full. The bulb could also overflow. Write down how much fluid you remove each time you empty your drain. Add up the amount removed in 24 hours.  Empty the bulb at the same time every day once the amount of  fluid decreases and you only need to empty it once a day. Write down the amounts and the 24-hour totals to give to your health care provider. This helps your health care provider know when the tubes can be removed. EMPTYING THE BULB DRAIN Before emptying the bulb, get a measuring cup, a piece of paper, and a pen and wash your hands.  Gently run your fingers down the tube (stripping) to empty any drainage from the tubing into the bulb. This may need to be done several times a day to clear the tubing of clots and tissue.  Open the bulb cap to release suction, which causes it to inflate. Do not touch the inside of the cap.  Gently run your fingers down the tube (stripping) to empty any drainage from the tubing into the bulb.  Hold the cap out of the way, and pour fluid into the measuring cup.   Squeeze the bulb to provide suction.  Replace the cap.   Check the tape that holds the tube to your skin. If it is becoming loose, you can remove the loose piece of tape and apply a new one. Then, pin the bulb to your shirt.   Write down the amount of fluid you emptied out. Write down the date and each time you emptied your bulb drain. (If there are 2 bulbs, note the amount of drainage from each bulb and keep the totals separate. Your health care provider will want to know the total amounts for each drain and  which tube is draining more.)   Flush the fluid down the toilet and wash your hands.   Call your health care provider once you have less than 2 tbsp of fluid collecting in the bulb drain every 24 hours. If there is drainage around the tube site, change dressings and keep the area dry. Cleanse around tube with sterile saline and place dry gauze around site. This gauze should be changed when it is soiled. If it stays clean and unsoiled, it should still be changed daily.  SEEK MEDICAL CARE IF:  Your drainage has a bad smell or is cloudy.   You have a fever.   Your drainage is increasing  instead of decreasing.   Your tube fell out.   You have redness or swelling around the tube site.   You have drainage from a surgical wound.   Your bulb drain will not stay flat after you empty it.  MAKE SURE YOU:   Understand these instructions.  Will watch your condition.  Will get help right away if you are not doing well or get worse. Document Released: 02/16/2000 Document Revised: 12/09/2012 Document Reviewed: 07/24/2011 [facility] Patient Information 2014 [place], [place].

## 2013-03-31 NOTE — Telephone Encounter (Signed)
, °

## 2013-03-31 NOTE — Progress Notes (Signed)
Discharge instructions gone over with patient. Home medications gone over. Prescription given. Patient demonstrated how to empty jp drain. Diet , activity, incisional care, and signs and symptoms of infection gone over. Discussed reasons to call the doctor. Patient verbalized understanding of instructions and is ambulating in the hallway. She states she is comfortable with being discharged.

## 2013-03-31 NOTE — Evaluation (Signed)
Physical Therapy Evaluation Patient Details Name: Sabrina [name] MRN: [id number] DOB: 1926-12-15 Today's Date: 03/31/2013 Time: [id number] PT Time Calculation (min): 19 min  PT Assessment / Plan / Recommendation History of Present Illness  Pt is s/p BREAST LUMPECTOMY WITH NEEDLE LOCALIZATION (Rt)  Clinical Impression  Pt adm and s/p surgery to remove lump/cancer in Rt breast. Pt able to ambulate at supervision level for safety only. Is ambulating at baseline per pt. Encouraged pt to ambulate with SPC as needed for balance. Pt educated and demo good technique with negotiating stairs. Pt is safe from mobility standpoint to D/c home. Encouraged to consult with MD regarding therapy for Rt UE ROM when appropriate.     PT Assessment  Patent does not need any further PT services    Follow Up Recommendations  Outpatient PT;Supervision/Assistance - 24 hour;Other (comment) (as instructed by her MD )    Does the patient have the potential to tolerate intense rehabilitation      Barriers to Discharge        Equipment Recommendations  None recommended by PT    Recommendations for Other Services     Frequency      Precautions / Restrictions Precautions Precautions: None Restrictions Weight Bearing Restrictions: No   Pertinent Vitals/Pain Stable t/o session. No complaints       Mobility  Bed Mobility General bed mobility comments: not assessed; pt in recliner Transfers Overall transfer level: Modified independent Equipment used: None General transfer comment: no LOB; good technique noted Ambulation/Gait Ambulation/Gait assistance: Supervision Ambulation Distance (Feet): 250 Feet Assistive device: None Gait Pattern/deviations: Step-through pattern;Decreased stride length (guarded ) Gait velocity: decreased; guarded  Gait velocity interpretation: Below normal speed for age/gender General Gait Details: pt very guarded with gt; no LOB noted; supervision for safety only; encouraged to  ambulate with SPC PRN for balance   Stairs: Yes Stairs assistance: Supervision Stair Management: One rail Left;Step to pattern;Forwards Number of Stairs: 3 General stair comments: educated on stair negotiating technique; pt limited due to pain In Lt Knee; supervision for safety          PT Diagnosis:    PT Problem List:   PT Treatment Interventions:       PT Goals(Current goals can be found in the care plan section) Acute Rehab PT Goals Patient Stated Goal: to go home PT Goal Formulation: No goals set, d/c therapy  Visit Information  Last PT Received On: 03/31/13 Assistance Needed: +1 History of Present Illness: Pt is s/p BREAST LUMPECTOMY WITH NEEDLE LOCALIZATION (Rt)       Prior [place] expects to be discharged to:: Private residence Living Arrangements: Spouse/significant other Available Help at Discharge: Family;Available 24 hours/day Type of Home: House Home Access: Stairs to enter [employer] of Steps: 3 Entrance Stairs-Rails: Can reach both;Left;Right Home Layout: One level Home Equipment: Cane - single point Prior Function Level of Independence: Independent Comments: pt ambulated with SPC in community  Communication Communication: No difficulties Dominant Hand: Right    Cognition  Cognition Arousal/Alertness: Awake/alert Behavior During Therapy: WFL for tasks assessed/performed Overall Cognitive Status: Within Functional Limits for tasks assessed    Extremity/Trunk Assessment Upper Extremity Assessment Upper Extremity Assessment: RUE deficits/detail RUE Deficits / Details: limited due to surgery RUE: Unable to fully assess due to immobilization;Unable to fully assess due to pain Lower Extremity Assessment Lower Extremity Assessment: Overall WFL for tasks assessed Cervical / Trunk Assessment Cervical / Trunk Assessment: Normal   Balance Balance Overall balance  assessment: Modified Independent  End of Session  PT - End of Session Equipment Utilized During Treatment: Gait belt Activity Tolerance: Patient tolerated treatment well Patient left: in chair;with call bell/phone within reach Nurse Communication: Mobility status;Precautions  GP Functional Assessment Tool Used: clinical judgement  Functional Limitation: Mobility: Walking and moving around Mobility: Walking and Moving Around Current Status [id number]): At least 1 percent but less than 20 percent impaired, limited or restricted Mobility: Walking and Moving Around Goal Status [phone]): 0 percent impaired, limited or restricted Mobility: Walking and Moving Around Discharge Status [phone]): At least 1 percent but less than 20 percent impaired, limited or restricted   Gustavus Bryant, [name] 03/31/2013, 5:21 PM

## 2013-04-01 ENCOUNTER — Encounter (HOSPITAL_COMMUNITY): Payer: Self-pay | Admitting: General Surgery

## 2013-04-02 ENCOUNTER — Encounter (INDEPENDENT_AMBULATORY_CARE_PROVIDER_SITE_OTHER): Payer: Medicare Other

## 2013-04-06 ENCOUNTER — Telehealth (INDEPENDENT_AMBULATORY_CARE_PROVIDER_SITE_OTHER): Payer: Self-pay

## 2013-04-06 NOTE — Telephone Encounter (Signed)
I have her path today, will call her today. How much is the drain putting out?

## 2013-04-06 NOTE — Telephone Encounter (Signed)
Patient calling into office to report that her drain output has been 2-21/2 mL's.  Patient would like to know if she need's to come in for nurse visit for drain removal and pathology results.  Patient advised that a message would be sent to Dr. Donne Hazel for Drain Removal order and pathology results.  We will call patient with further instructions and more information regarding her appointment.  Patient has post op appointment on 04/14/13 w/Dr. Donne Hazel.

## 2013-04-06 NOTE — Telephone Encounter (Signed)
Called pt to make her nurse visit to have drain removed. The pt will come in on 04/07/13 at 11:00 nurse visit.

## 2013-04-07 ENCOUNTER — Ambulatory Visit (INDEPENDENT_AMBULATORY_CARE_PROVIDER_SITE_OTHER): Payer: Medicare Other

## 2013-04-07 VITALS — BP 188/74 | HR 80 | Temp 98.4°F | Resp 20

## 2013-04-07 DIAGNOSIS — Z4802 Encounter for removal of sutures: Secondary | ICD-10-CM

## 2013-04-07 NOTE — Progress Notes (Signed)
Pt in office today for drain removal per Dr Cristal Generous request. Drain only producing 25cc in 24 hr. Drain site clean and dry. No redness. No fever. Drain fluid clear serous. Pt states she is doing well. Suture removed at drain site. Drain removed and clean dsg applied. Pt tolerated well w/o complaint. Pt advised of BP being elevated. Pt states she forgot to take her BP med. Pt advised of importance of take BP med daily. Pt state she understands. Pt has appt next week with Dr Donne Hazel and will call with any concerns.

## 2013-04-08 ENCOUNTER — Other Ambulatory Visit (INDEPENDENT_AMBULATORY_CARE_PROVIDER_SITE_OTHER): Payer: Self-pay | Admitting: General Surgery

## 2013-04-09 ENCOUNTER — Other Ambulatory Visit: Payer: Self-pay | Admitting: Oncology

## 2013-04-12 ENCOUNTER — Telehealth: Payer: Self-pay | Admitting: Oncology

## 2013-04-12 ENCOUNTER — Ambulatory Visit (HOSPITAL_BASED_OUTPATIENT_CLINIC_OR_DEPARTMENT_OTHER): Payer: Medicare Other | Admitting: Oncology

## 2013-04-12 VITALS — BP 193/74 | HR 101 | Temp 97.6°F | Resp 20 | Ht 58.5 in | Wt 187.4 lb

## 2013-04-12 DIAGNOSIS — Z170 Estrogen receptor positive status [ER+]: Secondary | ICD-10-CM

## 2013-04-12 DIAGNOSIS — C50919 Malignant neoplasm of unspecified site of unspecified female breast: Secondary | ICD-10-CM

## 2013-04-12 DIAGNOSIS — C50411 Malignant neoplasm of upper-outer quadrant of right female breast: Secondary | ICD-10-CM

## 2013-04-12 DIAGNOSIS — C50419 Malignant neoplasm of upper-outer quadrant of unspecified female breast: Secondary | ICD-10-CM

## 2013-04-12 DIAGNOSIS — C773 Secondary and unspecified malignant neoplasm of axilla and upper limb lymph nodes: Secondary | ICD-10-CM

## 2013-04-12 NOTE — Patient Instructions (Addendum)
Implanted Port Home Guide    An implanted port is a type of central line that is placed under the skin. Central lines are used to provide IV access when treatment or nutrition needs to be given through a person's veins. Implanted ports are used for long-term IV access. An implanted port may be placed because:    You need IV medicine that would be irritating to the small veins in your hands or arms.     You need long-term IV medicines, such as antibiotics.     You need IV nutrition for a long period.     You need frequent blood draws for lab tests.     You need dialysis.    Implanted ports are usually placed in the chest area, but they can also be placed in the upper arm, the abdomen, or the leg. An implanted port has two main parts:    Reservoir. The reservoir is round and will appear as a small, raised area under your skin. The reservoir is the part where a needle is inserted to give medicines or draw blood.     Catheter. The catheter is a thin, flexible tube that extends from the reservoir. The catheter is placed into a large vein. Medicine that is inserted into the reservoir goes into the catheter and then into the vein.    HOW WILL I CARE FOR MY INCISION SITE?  Do not get the incision site wet. Bathe or shower as directed by your health care provider.   HOW IS MY PORT ACCESSED?  Special steps must be taken to access the port:    Before the port is accessed, a numbing cream can be placed on the skin. This helps numb the skin over the port site.     Your health care provider uses a sterile technique to access the port.   Your health care provider must put on a mask and sterile gloves.   The skin over your port is cleaned carefully with an antiseptic and allowed to dry.   The port is gently pinched between sterile gloves, and a needle is inserted into the port.   Only "non-coring" port needles should be used to access the port. Once the port is accessed, a blood return should be checked. This helps  ensure that the port is in the vein and is not clogged.     If your port needs to remain accessed for a constant infusion, a clear (transparent) bandage will be placed over the needle site. The bandage and needle will need to be changed every week, or as directed by your health care provider.     Keep the bandage covering the needle clean and dry. Do not get it wet. Follow your health care provider's instructions on how to take a shower or bath while the port is accessed.     If your port does not need to stay accessed, no bandage is needed over the port.    WHAT IS FLUSHING?  Flushing helps keep the port from getting clogged. Follow your health care provider's instructions on how and when to flush the port. Ports are usually flushed with saline solution or a medicine called heparin. The need for flushing will depend on how the port is used.    If the port is used for intermittent medicines or blood draws, the port will need to be flushed:     After medicines have been given.     After blood has been drawn.       provider thinks it is needed. When it is time for the port to come out, surgery will be done to remove it. The procedure is similar to the one performed when the port was put in.  WHEN SHOULD I SEEK IMMEDIATE MEDICAL CARE? When you have an implanted port, you should seek immediate medical care if:   You notice a bad smell coming from the incision site.   You have swelling, redness, or drainage at the incision site.   You have more swelling or pain at the port site or the surrounding area.   You have a fever that is not controlled with medicine. Document Released: 02/18/2005 Document Revised: 12/09/2012 Document Reviewed: 10/26/2012 [facility] Patient Information 2014 [place].  Trastuzumab injection for infusion What is this medicine? TRASTUZUMAB (tras TOO zoo mab) is a monoclonal antibody. It targets a protein called HER2. This protein is found in some stomach and breast cancers. This medicine can stop cancer cell growth. This medicine may be used with other cancer treatments. This medicine may be used for other purposes; ask your health care provider or pharmacist if you have questions. COMMON BRAND NAME(S): Herceptin What should I tell my health care provider before I take this medicine? They need to know if you have any of these conditions: -heart disease -heart failure -infection (especially a virus infection such as chickenpox, cold sores, or herpes) -lung or breathing disease, like asthma -recent or ongoing radiation therapy -an unusual or allergic reaction to trastuzumab, benzyl alcohol, or other medications, foods, dyes, or preservatives -pregnant or trying to get pregnant -breast-feeding How should I use this medicine? This drug is given as an infusion into a vein. It is administered in a hospital or clinic by a specially trained health care professional. Talk to your pediatrician regarding the use of this medicine in children. This medicine is not approved for use in children. Overdosage: If you think you have taken too much of this medicine contact a poison control center or emergency room at once. NOTE: This medicine is only for you. Do not share this medicine with others. What if I miss a dose? It is important not to miss a dose. Call your doctor or health care professional if you are unable to keep an appointment. What may interact with this medicine? -cyclophosphamide -doxorubicin -warfarin This list may not describe all possible interactions. Give your health care provider a list of all the medicines, herbs, non-prescription drugs, or dietary supplements you use. Also tell them if you smoke, drink alcohol, or use illegal drugs. Some items  may interact with your medicine. What should I watch for while using this medicine? Visit your doctor for checks on your progress. Report any side effects. Continue your course of treatment even though you feel ill unless your doctor tells you to stop. Call your doctor or health care professional for advice if you get a fever, chills or sore throat, or other symptoms of a cold or flu. Do not treat yourself. Try to avoid being around people who are sick. You may experience fever, chills and shaking during your first infusion. These effects are usually mild and can be treated with other medicines. Report any side effects during the infusion to your health care professional. Fever and chills usually do not happen with later infusions. What side effects may I notice from receiving this medicine? Side effects that you should report to your doctor or other health care professional as soon as possible: -breathing difficulties -chest pain or  palpitations -cough -dizziness or fainting -fever or chills, sore throat -skin rash, itching or hives -swelling of the legs or ankles -unusually weak or tired Side effects that usually do not require medical attention (report to your doctor or other health care professional if they continue or are bothersome): -loss of appetite -headache -muscle aches -nausea This list may not describe all possible side effects. Call your doctor for medical advice about side effects. You may report side effects to FDA at 1-800-FDA-1088. Where should I keep my medicine? This drug is given in a hospital or clinic and will not be stored at home. NOTE: This sheet is a summary. It may not cover all possible information. If you have questions about this medicine, talk to your doctor, pharmacist, or health care provider.  2014, [employer]vier/Gold Standard. (2008-12-23 13:43:15)

## 2013-04-12 NOTE — Telephone Encounter (Signed)
, °

## 2013-04-13 ENCOUNTER — Encounter (HOSPITAL_BASED_OUTPATIENT_CLINIC_OR_DEPARTMENT_OTHER): Payer: Self-pay | Admitting: *Deleted

## 2013-04-13 ENCOUNTER — Telehealth: Payer: Self-pay | Admitting: *Deleted

## 2013-04-13 NOTE — Pre-Procedure Instructions (Signed)
Office note and labs req. from [facility]/Dr. [name]

## 2013-04-13 NOTE — Telephone Encounter (Signed)
Per staff message and POF I have scheduled appts.  [name]  

## 2013-04-13 NOTE — Pre-Procedure Instructions (Signed)
Nephrology note reviewed by Dr. Al Corpus; pt. OK to come for surgery.

## 2013-04-14 ENCOUNTER — Ambulatory Visit (INDEPENDENT_AMBULATORY_CARE_PROVIDER_SITE_OTHER): Payer: Medicare Other | Admitting: General Surgery

## 2013-04-14 ENCOUNTER — Encounter (INDEPENDENT_AMBULATORY_CARE_PROVIDER_SITE_OTHER): Payer: Self-pay | Admitting: General Surgery

## 2013-04-14 VITALS — BP 162/72 | HR 76 | Resp 16 | Ht 58.5 in | Wt 185.0 lb

## 2013-04-14 DIAGNOSIS — Z09 Encounter for follow-up examination after completed treatment for conditions other than malignant neoplasm: Secondary | ICD-10-CM

## 2013-04-14 NOTE — Progress Notes (Signed)
Subjective:     Patient ID: Sabrina [name], female   DOB: 05-20-1926, 80 y.o.   MRN: [id number]  HPI 58 yof s/p right lumpectomy, targeted node dissection with pathology showing what ends up being positive posterior margin.  She has a 1.4 cm IDC, grade III, er/pr positive and her 2 was initially equivocal but positive on final pathology result.  All other margins are clear  Review of Systems     Objective:   Physical Exam Healing right breast and axillary incisions without infection    Assessment:     Stage II right breast cancer    Plan:     Re-excision margins, port placement     We discussed indication to clear margin with excision.  She has been seen by med onc and has been recommended chemo/herceptin.  Will plan port placement at same time. Risks discussed

## 2013-04-15 ENCOUNTER — Encounter: Payer: Self-pay | Admitting: Oncology

## 2013-04-15 ENCOUNTER — Encounter (HOSPITAL_BASED_OUTPATIENT_CLINIC_OR_DEPARTMENT_OTHER): Payer: Medicare Other | Admitting: Certified Registered"

## 2013-04-15 ENCOUNTER — Ambulatory Visit (HOSPITAL_BASED_OUTPATIENT_CLINIC_OR_DEPARTMENT_OTHER): Payer: Medicare Other | Admitting: Certified Registered"

## 2013-04-15 ENCOUNTER — Encounter (HOSPITAL_BASED_OUTPATIENT_CLINIC_OR_DEPARTMENT_OTHER)
Admission: RE | Disposition: A | Discharge status: Home / Self Care | Payer: Self-pay | Source: Ambulatory Visit | Attending: General Surgery

## 2013-04-15 ENCOUNTER — Ambulatory Visit (HOSPITAL_COMMUNITY): Payer: Medicare Other

## 2013-04-15 ENCOUNTER — Ambulatory Visit (HOSPITAL_BASED_OUTPATIENT_CLINIC_OR_DEPARTMENT_OTHER)
Admission: RE | Admit: 2013-04-15 | Discharge: 2013-04-16 | Disposition: A | Discharge status: Home / Self Care | Payer: Medicare Other | Source: Ambulatory Visit | Attending: General Surgery | Admitting: General Surgery

## 2013-04-15 ENCOUNTER — Encounter (HOSPITAL_BASED_OUTPATIENT_CLINIC_OR_DEPARTMENT_OTHER): Payer: Self-pay | Admitting: Certified Registered"

## 2013-04-15 DIAGNOSIS — E119 Type 2 diabetes mellitus without complications: Secondary | ICD-10-CM | POA: Insufficient documentation

## 2013-04-15 DIAGNOSIS — Z87891 Personal history of nicotine dependence: Secondary | ICD-10-CM | POA: Insufficient documentation

## 2013-04-15 DIAGNOSIS — I10 Essential (primary) hypertension: Secondary | ICD-10-CM | POA: Insufficient documentation

## 2013-04-15 DIAGNOSIS — C50919 Malignant neoplasm of unspecified site of unspecified female breast: Secondary | ICD-10-CM

## 2013-04-15 DIAGNOSIS — C50419 Malignant neoplasm of upper-outer quadrant of unspecified female breast: Secondary | ICD-10-CM | POA: Insufficient documentation

## 2013-04-15 HISTORY — DX: Type 2 diabetes mellitus without complications: E11.9

## 2013-04-15 HISTORY — DX: Pure hypercholesterolemia, unspecified: E78.00

## 2013-04-15 HISTORY — DX: Chronic kidney disease, stage 3 (moderate): N18.3

## 2013-04-15 HISTORY — DX: Presence of dental prosthetic device (complete) (partial): Z97.2

## 2013-04-15 HISTORY — PX: RE-EXCISION OF BREAST CANCER,SUPERIOR MARGINS: SHX6047

## 2013-04-15 HISTORY — DX: Unspecified cataract: H26.9

## 2013-04-15 HISTORY — PX: PORTACATH PLACEMENT: SHX2246

## 2013-04-15 HISTORY — DX: Dental restoration status: Z98.811

## 2013-04-15 HISTORY — DX: Chronic kidney disease, stage 3 unspecified: N18.30

## 2013-04-15 LAB — GLUCOSE, CAPILLARY
Glucose-Capillary: 142 mg/dL — ABNORMAL HIGH (ref 70–99)
Glucose-Capillary: 131 mg/dL — ABNORMAL HIGH (ref 70–99)
Glucose-Capillary: 169 mg/dL — ABNORMAL HIGH (ref 70–99)

## 2013-04-15 SURGERY — RE-EXCISION OF BREAST CANCER,SUPERIOR MARGINS
Anesthesia: Regional | Laterality: Right

## 2013-04-15 MED ORDER — FENTANYL CITRATE 0.05 MG/ML IJ SOLN
50.0000 ug | INTRAMUSCULAR | Status: DC | PRN
  Administered 2013-04-15: 100 ug via INTRAVENOUS

## 2013-04-15 MED ORDER — FENTANYL CITRATE 0.05 MG/ML IJ SOLN
INTRAMUSCULAR | Status: AC
  Filled 2013-04-15: qty 4

## 2013-04-15 MED ORDER — METFORMIN HCL 850 MG PO TABS
850.0000 mg | ORAL_TABLET | Freq: Two times a day (BID) | ORAL | Status: DC
  Administered 2013-04-15: 850 mg via ORAL

## 2013-04-15 MED ORDER — HEPARIN SOD (PORK) LOCK FLUSH 100 UNIT/ML IV SOLN
INTRAVENOUS | Status: AC
  Filled 2013-04-15: qty 5

## 2013-04-15 MED ORDER — CEFAZOLIN SODIUM-DEXTROSE 2-3 GM-% IV SOLR
2.0000 g | INTRAVENOUS | Status: AC
  Administered 2013-04-15: 2 g via INTRAVENOUS

## 2013-04-15 MED ORDER — HYDROCODONE-ACETAMINOPHEN 5-325 MG PO TABS
1.0000 | ORAL_TABLET | ORAL | Status: DC | PRN
  Administered 2013-04-15: 2 via ORAL
  Administered 2013-04-16: 1 via ORAL
  Filled 2013-04-15: qty 1
  Filled 2013-04-15: qty 2

## 2013-04-15 MED ORDER — BUPIVACAINE HCL (PF) 0.25 % IJ SOLN
INTRAMUSCULAR | Status: DC | PRN
  Administered 2013-04-15: 18 mL

## 2013-04-15 MED ORDER — ACETAMINOPHEN 650 MG RE SUPP: 650.0000 mg | Freq: Four times a day (QID) | RECTAL | Status: DC | PRN

## 2013-04-15 MED ORDER — SODIUM CHLORIDE 0.9 % IV SOLN
INTRAVENOUS | Status: DC
  Administered 2013-04-15: 20:00:00 via INTRAVENOUS

## 2013-04-15 MED ORDER — ONDANSETRON HCL 8 MG PO TABS: 8.0000 mg | ORAL_TABLET | Freq: Two times a day (BID) | ORAL | Status: DC

## 2013-04-15 MED ORDER — PROPOFOL 10 MG/ML IV BOLUS
INTRAVENOUS | Status: DC | PRN
  Administered 2013-04-15: 100 mg via INTRAVENOUS

## 2013-04-15 MED ORDER — INSULIN ASPART 100 UNIT/ML ~~LOC~~ SOLN
SUBCUTANEOUS | Status: AC
  Filled 2013-04-15: qty 1

## 2013-04-15 MED ORDER — HEPARIN (PORCINE) IN NACL 2-0.9 UNIT/ML-% IJ SOLN
INTRAMUSCULAR | Status: AC
  Filled 2013-04-15: qty 500

## 2013-04-15 MED ORDER — MIDAZOLAM HCL 2 MG/ML PO SYRP: 12.0000 mg | ORAL_SOLUTION | Freq: Once | ORAL | Status: DC | PRN

## 2013-04-15 MED ORDER — FENTANYL CITRATE 0.05 MG/ML IJ SOLN
INTRAMUSCULAR | Status: AC
  Filled 2013-04-15: qty 2

## 2013-04-15 MED ORDER — ONDANSETRON HCL 4 MG/2ML IJ SOLN
INTRAMUSCULAR | Status: DC | PRN
  Administered 2013-04-15: 4 mg via INTRAVENOUS

## 2013-04-15 MED ORDER — ACETAMINOPHEN 325 MG PO TABS: 650.0000 mg | ORAL_TABLET | Freq: Four times a day (QID) | ORAL | Status: DC | PRN

## 2013-04-15 MED ORDER — LIDOCAINE HCL (PF) 1 % IJ SOLN
INTRAMUSCULAR | Status: AC
  Filled 2013-04-15: qty 30

## 2013-04-15 MED ORDER — INSULIN ASPART 100 UNIT/ML ~~LOC~~ SOLN
0.0000 [IU] | Freq: Three times a day (TID) | SUBCUTANEOUS | Status: DC
  Administered 2013-04-15: 3 [IU] via SUBCUTANEOUS
  Filled 2013-04-15: qty 1

## 2013-04-15 MED ORDER — BUPIVACAINE HCL (PF) 0.25 % IJ SOLN
INTRAMUSCULAR | Status: AC
  Filled 2013-04-15: qty 30

## 2013-04-15 MED ORDER — PROCHLORPERAZINE MALEATE 10 MG PO TABS: 10.0000 mg | ORAL_TABLET | Freq: Four times a day (QID) | ORAL | Status: DC | PRN

## 2013-04-15 MED ORDER — MIDAZOLAM HCL 2 MG/2ML IJ SOLN
1.0000 mg | INTRAMUSCULAR | Status: DC | PRN
  Administered 2013-04-15: 2 mg via INTRAVENOUS

## 2013-04-15 MED ORDER — CEFAZOLIN SODIUM-DEXTROSE 2-3 GM-% IV SOLR
INTRAVENOUS | Status: AC
  Filled 2013-04-15: qty 50

## 2013-04-15 MED ORDER — MIDAZOLAM HCL 2 MG/2ML IJ SOLN
INTRAMUSCULAR | Status: AC
  Filled 2013-04-15: qty 2

## 2013-04-15 MED ORDER — FENTANYL CITRATE 0.05 MG/ML IJ SOLN
INTRAMUSCULAR | Status: DC | PRN
  Administered 2013-04-15: 50 ug via INTRAVENOUS

## 2013-04-15 MED ORDER — HEPARIN SOD (PORK) LOCK FLUSH 100 UNIT/ML IV SOLN
INTRAVENOUS | Status: DC | PRN
  Administered 2013-04-15: 500 [IU] via INTRAVENOUS

## 2013-04-15 MED ORDER — LORAZEPAM 0.5 MG PO TABS: 0.5000 mg | ORAL_TABLET | Freq: Four times a day (QID) | ORAL | Status: DC | PRN

## 2013-04-15 MED ORDER — LACTATED RINGERS IV SOLN
INTRAVENOUS | Status: DC
  Administered 2013-04-15 (×2): via INTRAVENOUS

## 2013-04-15 MED ORDER — DEXAMETHASONE 4 MG PO TABS: 8.0000 mg | ORAL_TABLET | Freq: Two times a day (BID) | ORAL | Status: DC

## 2013-04-15 MED ORDER — LIDOCAINE HCL (CARDIAC) 20 MG/ML IV SOLN
INTRAVENOUS | Status: DC | PRN
  Administered 2013-04-15: 30 mg via INTRAVENOUS

## 2013-04-15 MED ORDER — HEPARIN (PORCINE) IN NACL 2-0.9 UNIT/ML-% IJ SOLN
INTRAMUSCULAR | Status: DC | PRN
  Administered 2013-04-15: 1 via INTRAVENOUS

## 2013-04-15 MED ORDER — ONDANSETRON HCL 4 MG/2ML IJ SOLN: 4.0000 mg | Freq: Four times a day (QID) | INTRAMUSCULAR | Status: DC | PRN

## 2013-04-15 MED ORDER — CLONIDINE HCL 0.2 MG PO TABS
0.2000 mg | ORAL_TABLET | Freq: Two times a day (BID) | ORAL | Status: DC
Start: 2013-04-15 — End: 2013-04-16
  Administered 2013-04-15: 0.2 mg via ORAL

## 2013-04-15 MED ORDER — DEXAMETHASONE SODIUM PHOSPHATE 4 MG/ML IJ SOLN
INTRAMUSCULAR | Status: DC | PRN
  Administered 2013-04-15: 4 mg via INTRAVENOUS

## 2013-04-15 MED ORDER — HYDROMORPHONE HCL PF 1 MG/ML IJ SOLN: 0.2500 mg | INTRAMUSCULAR | Status: DC | PRN

## 2013-04-15 MED ORDER — TRAMADOL HCL 50 MG PO TABS
50.0000 mg | ORAL_TABLET | Freq: Two times a day (BID) | ORAL | Status: DC
  Administered 2013-04-15: 50 mg via ORAL

## 2013-04-15 MED ORDER — MORPHINE SULFATE 2 MG/ML IJ SOLN: 2.0000 mg | INTRAMUSCULAR | Status: DC | PRN

## 2013-04-15 SURGICAL SUPPLY — 67 items
APPLIER CLIP 9.375 MED OPEN (MISCELLANEOUS)
BAG DECANTER FOR FLEXI CONT (MISCELLANEOUS) ×3 IMPLANT
BENZOIN TINCTURE PRP APPL 2/3 (GAUZE/BANDAGES/DRESSINGS) ×3 IMPLANT
BINDER BREAST LRG (GAUZE/BANDAGES/DRESSINGS) IMPLANT
BINDER BREAST MEDIUM (GAUZE/BANDAGES/DRESSINGS) IMPLANT
BINDER BREAST XLRG (GAUZE/BANDAGES/DRESSINGS) IMPLANT
BINDER BREAST XXLRG (GAUZE/BANDAGES/DRESSINGS) ×3 IMPLANT
BLADE SURG 11 STRL SS (BLADE) ×3 IMPLANT
BLADE SURG 15 STRL LF DISP TIS (BLADE) ×2 IMPLANT
BLADE SURG 15 STRL SS (BLADE) ×1
CANISTER SUCT 1200ML W/VALVE (MISCELLANEOUS) IMPLANT
CHLORAPREP W/TINT 26ML (MISCELLANEOUS) ×6 IMPLANT
CLIP APPLIE 9.375 MED OPEN (MISCELLANEOUS) IMPLANT
COVER MAYO STAND STRL (DRAPES) ×3 IMPLANT
COVER TABLE BACK 60X90 (DRAPES) ×3 IMPLANT
DECANTER SPIKE VIAL GLASS SM (MISCELLANEOUS) IMPLANT
DERMABOND ADVANCED (GAUZE/BANDAGES/DRESSINGS) ×2
DERMABOND ADVANCED .7 DNX12 (GAUZE/BANDAGES/DRESSINGS) ×4 IMPLANT
DEVICE DUBIN W/COMP PLATE 8390 (MISCELLANEOUS) IMPLANT
DRAPE C-ARM 42X72 X-RAY (DRAPES) ×3 IMPLANT
DRAPE LAPAROSCOPIC ABDOMINAL (DRAPES) ×6 IMPLANT
DRAPE PED LAPAROTOMY (DRAPES) IMPLANT
DRSG TEGADERM 4X4.75 (GAUZE/BANDAGES/DRESSINGS) ×3 IMPLANT
ELECT COATED BLADE 2.86 ST (ELECTRODE) ×3 IMPLANT
ELECT REM PT RETURN 9FT ADLT (ELECTROSURGICAL) ×3
ELECTRODE REM PT RTRN 9FT ADLT (ELECTROSURGICAL) ×2 IMPLANT
GLOVE BIO SURGEON STRL SZ 6.5 (GLOVE) ×3 IMPLANT
GLOVE BIO SURGEON STRL SZ7 (GLOVE) ×3 IMPLANT
GLOVE BIOGEL M STRL SZ7.5 (GLOVE) ×3 IMPLANT
GLOVE BIOGEL PI IND STRL 7.5 (GLOVE) ×2 IMPLANT
GLOVE BIOGEL PI IND STRL 8 (GLOVE) ×2 IMPLANT
GLOVE BIOGEL PI INDICATOR 7.5 (GLOVE) ×1
GLOVE BIOGEL PI INDICATOR 8 (GLOVE) ×1
GOWN STRL REUS W/ TWL LRG LVL3 (GOWN DISPOSABLE) ×8 IMPLANT
GOWN STRL REUS W/TWL LRG LVL3 (GOWN DISPOSABLE) ×4
IV KIT MINILOC 20X1 SAFETY (NEEDLE) IMPLANT
KIT PORT POWER 8FR ISP CVUE (Catheter) ×3 IMPLANT
NDL SAFETY ECLIPSE 18X1.5 (NEEDLE) IMPLANT
NEEDLE HYPO 18GX1.5 SHARP (NEEDLE)
NEEDLE HYPO 25X1 1.5 SAFETY (NEEDLE) ×3 IMPLANT
NS IRRIG 1000ML POUR BTL (IV SOLUTION) ×3 IMPLANT
PACK BASIN DAY SURGERY FS (CUSTOM PROCEDURE TRAY) ×3 IMPLANT
PENCIL BUTTON HOLSTER BLD 10FT (ELECTRODE) ×3 IMPLANT
SLEEVE SCD COMPRESS KNEE MED (MISCELLANEOUS) ×3 IMPLANT
SPONGE GAUZE 4X4 12PLY STER LF (GAUZE/BANDAGES/DRESSINGS) ×3 IMPLANT
SPONGE LAP 4X18 X RAY DECT (DISPOSABLE) ×6 IMPLANT
STAPLER VISISTAT 35W (STAPLE) ×3 IMPLANT
STRIP CLOSURE SKIN 1/2X4 (GAUZE/BANDAGES/DRESSINGS) ×3 IMPLANT
SUT MNCRL AB 3-0 PS2 18 (SUTURE) IMPLANT
SUT MNCRL AB 4-0 PS2 18 (SUTURE) IMPLANT
SUT MON AB 4-0 PC3 18 (SUTURE) ×3 IMPLANT
SUT PROLENE 2 0 SH DA (SUTURE) ×3 IMPLANT
SUT SILK 2 0 SH (SUTURE) ×3 IMPLANT
SUT SILK 2 0 TIES 17X18 (SUTURE)
SUT SILK 2-0 18XBRD TIE BLK (SUTURE) IMPLANT
SUT VIC AB 2-0 SH 27 (SUTURE) ×1
SUT VIC AB 2-0 SH 27XBRD (SUTURE) ×2 IMPLANT
SUT VIC AB 3-0 SH 27 (SUTURE) ×1
SUT VIC AB 3-0 SH 27X BRD (SUTURE) ×2 IMPLANT
SUT VIC AB 5-0 PS2 18 (SUTURE) IMPLANT
SUT VICRYL AB 3 0 TIES (SUTURE) IMPLANT
SYR 5ML LUER SLIP (SYRINGE) ×3 IMPLANT
SYR CONTROL 10ML LL (SYRINGE) ×3 IMPLANT
TOWEL OR 17X24 6PK STRL BLUE (TOWEL DISPOSABLE) ×3 IMPLANT
TOWEL OR NON WOVEN STRL DISP B (DISPOSABLE) ×3 IMPLANT
TUBE CONNECTING 20X1/4 (TUBING) IMPLANT
YANKAUER SUCT BULB TIP NO VENT (SUCTIONS) ×3 IMPLANT

## 2013-04-15 NOTE — Interval H&P Note (Signed)
History and Physical Interval Note:  04/15/2013 3:16 PM  [place]  has presented today for surgery, with the diagnosis of right breast cancer  The various methods of treatment have been discussed with the patient and family. After consideration of risks, benefits and other options for treatment, the patient has consented to  Procedure(s): RE-EXCISION OF RIGHT BREAST  MARGINS (Right) INSERTION PORT-A-CATH (N/A) as a surgical intervention .  The patient's history has been reviewed, patient examined, no change in status, stable for surgery.  I have reviewed the patient's chart and labs.  Questions were answered to the patient's satisfaction.     Ger Nicks

## 2013-04-15 NOTE — Transfer of Care (Signed)
Immediate Anesthesia Transfer of Care Note  Patient: Sabrina [name]  Procedure(s) Performed: Procedure(s): RE-EXCISION OF RIGHT BREAST  MARGINS (Right) INSERTION PORT-A-CATH (N/A)  Patient Location: PACU  Anesthesia Type:GA combined with regional for post-op pain  Level of Consciousness: awake, alert , oriented and patient cooperative  Airway & Oxygen Therapy: Patient Spontanous Breathing and Patient connected to face mask oxygen  Post-op Assessment: Report given to PACU RN and Post -op Vital signs reviewed and stable  Post vital signs: Reviewed and stable  Complications: No apparent anesthesia complications

## 2013-04-15 NOTE — Progress Notes (Signed)
OFFICE PROGRESS NOTE  CC  [name], MD [address] [place] [address] Dr. Thea Silversmith  Dr. Rolm Bookbinder  DIAGNOSIS: 80 year old now with new diagnosis of T1 N1 right breast cancer  Breast cancer of upper-outer quadrant of right female breast   Primary site: Breast (Right)   Staging method: AJCC 7th Edition   Clinical: Stage IA (T1c, N0, cM0)   Summary: Stage IA (T1c, N0, cM0)  PRIOR THERAPY: #1screening mammogram patient was found to have a right breast mass measuring 1 cm. There were no abnormalities on her physical exam. Patient had ultrasound performed that showed 1.3 x 1.2 x 1.0 cm hypoechoic mass in the upper outer quadrant of the right breast. In the right axilla there was an abnormal appearing lymph node measuring 1.65 1.4 x 0.8 cm. Both her biopsy. The lymph node was positive for metastatic carcinoma. The primary breast tumor biopsy showed invasive ductal carcinoma, grade 2, ER positive PR positive HER-2/neu equivocal with a proliferation marker Ki-67 15%  #2 status post right lumpectomy with the final pathology revealing 1.4 cm invasive ductal carcinoma grade 3 ER positive PR positive repeat HER-2/neu testing revealed the HER-2 receptor to be positive. She did have history of margin that was positive. Reexcision is planned.   CURRENT THERAPY:proceed with reexcision and Port-A-Cath placement  INTERVAL HISTORY: Sabrina [name] 80 y.o. female returns for followup visit after surgery. I had received a call from Dr. Lyndon Code regarding patient's HER-2/neu status. She had initially 8 equivocal HER-2/neu receptor testing. On the final pathology HER-2/neu is positive. Patient is seen today to discuss this and the implications of this. She and I discussed the rationale for repeating the HER-2 receptor on the final pathology. We also discussed the implications of this including the need for systemic treatment with anti-HER-2 therapy such as Herceptin. We  discussed risks benefits and side effects of Herceptin. Patient understands that with the Herceptin she will need chemotherapy. She will also need a Port-A-Cath placement. She and her husband feel that she would like to do as much as she possibly can. She is gong to be seen by Dr. Rolm Bookbinder in the next few days to eventually get a reexcision of the posterior margin as well as to have a Port-A-Cath placed.  MEDICAL HISTORY: Past Medical History  Diagnosis Date  . Breast cancer 03/2013    right  . Heart murmur     no known problems; states did not know she had murmur until age 33  . Immature cataract   . Arthritis     neck  . Wears partial dentures     lower  . Dental crowns present   . Hypertension     fluctuates, especially when stressed; has been on med. > 20 yr.  . Non-insulin dependent type 2 diabetes mellitus   . High cholesterol   . Chronic kidney disease (CKD), stage III (moderate)     nephrologist, Dr. [name]    ALLERGIES:  has No Known Allergies.  MEDICATIONS:  Current Outpatient Prescriptions  Medication Sig Dispense Refill  . atorvastatin (LIPITOR) 10 MG tablet Take 10 mg by mouth daily.      . Biotin 1000 MCG tablet Take 1,000 mcg by mouth daily.      . Calcium Citrate (CITRACAL PO) Take 1 tablet by mouth daily.       . Cholecalciferol (VITAMIN D-3) 1000 UNITS CAPS Take 1,000 Units by mouth daily.      [name]  cloNIDine (CATAPRES) 0.2 MG tablet Take 0.2 mg by mouth 2 (two) times daily.      . furosemide (LASIX) 40 MG tablet Take 40 mg by mouth daily.      [name] HYDROcodone-acetaminophen (NORCO/VICODIN) 5-325 MG per tablet Take 1-2 tablets by mouth every 4 (four) hours as needed for moderate pain.  20 tablet  0  . IRON PO Take 65 mg by mouth daily.      . metFORMIN (GLUCOPHAGE) 850 MG tablet Take 850 mg by mouth 2 (two) times daily with a meal.      . Multiple Vitamins-Minerals (MULTIVITAMIN PO) Take 1 tablet by mouth daily.       . traMADol (ULTRAM) 50 MG  tablet Take 50 mg by mouth 2 (two) times daily.      . verapamil (VERELAN PM) 240 MG 24 hr capsule Take 240 mg by mouth daily.        No current facility-administered medications for this visit.    SURGICAL HISTORY:  Past Surgical History  Procedure Laterality Date  . Tonsillectomy      as a child  . Breast surgery Right 11/1958    right breast biopsy benign  . Dilation and curettage of uterus    . Breast lumpectomy with needle localization Right 03/30/2013    Procedure: BREAST LUMPECTOMY WITH NEEDLE LOCALIZATION;  Surgeon: Rolm Bookbinder, MD;  Location: [place];  Service: General;  Laterality: Right;  . Axillary lymph node dissection Right 03/30/2013    Procedure: AXILLARY LYMPH NODE DISSECTION;  Surgeon: Rolm Bookbinder, MD;  Location: [name];  Service: General;  Laterality: Right;    REVIEW OF SYSTEMS:  Pertinent items are noted in HPI.     PHYSICAL EXAMINATION: Blood pressure 193/74, pulse 101, temperature 97.6 F (36.4 C), temperature source Oral, resp. rate 20, height 4' 10.5" (1.486 m), weight 187 lb 6.4 oz (85.004 kg). Body mass index is 38.49 kg/(m^2). ECOG PERFORMANCE STATUS: 1 - Symptomatic but completely ambulatory   General appearance: alert, cooperative and appears stated age Resp: clear to auscultation bilaterally Back: symmetric, no curvature. ROM normal. No CVA tenderness. Cardio: regular rate and rhythm GI: soft, non-tender; bowel sounds normal; no masses,  no organomegaly Extremities: edema +1 Right breast: Excision is healing well as is the axillary incision. No evidence of infections or bleeding  LABORATORY DATA: Lab Results  Component Value Date   WBC 7.5 03/24/2013   HGB 11.6 03/24/2013   HCT 35.4 03/24/2013   MCV 90.5 03/24/2013   PLT 233 03/24/2013      Chemistry      Component Value Date/Time   NA 141 03/24/2013 1248   K 4.2 03/24/2013 1248   CO2 29 03/24/2013 1248   BUN 36.6* 03/24/2013 1248   CREATININE 1.7* 03/24/2013 1248      Component Value  Date/Time   CALCIUM 9.7 03/24/2013 1248   ALKPHOS 114 03/24/2013 1248   AST 13 03/24/2013 1248   ALT 18 03/24/2013 1248   BILITOT 0.85 03/24/2013 1248       RADIOGRAPHIC STUDIES:  Dg Chest 2 View  03/30/2013   CLINICAL DATA:  PREOP EVALUATION, RIGHT BREAST SURGERY  EXAM: CHEST  2 VIEW  COMPARISON:  DG CHEST 2V dated 02/19/2012  FINDINGS: The heart size and mediastinal contours are within normal limits. Both lungs are clear. The visualized skeletal structures are unremarkable.  IMPRESSION: No active cardiopulmonary disease.   Electronically Signed   By: Margaree Mackintosh M.D.   On: 03/30/2013 15:02  Mm Digital Diagnostic Unilat R  03/17/2013   CLINICAL DATA:  Suspicious right breast mass.  EXAM: POST-BIOPSY CLIP PLACEMENT RIGHT DIAGNOSTIC MAMMOGRAM  COMPARISON:  Previous exams.  FINDINGS: Films are performed following ultrasound guided biopsy of suspicious right breast mass. Wing shaped biopsy marking clip in appropriate position.  IMPRESSION: Appropriate position biopsy marking clip status post ultrasound-guided core needle biopsy of suspicious right breast mass.  Final Assessment: Post Procedure Mammograms for Marker Placement   Electronically Signed   By: Lovey Newcomer M.D.   On: 03/17/2013 11:21   Mm Radiologist Eval And Mgmt  03/18/2013   EXAM: ESTABLISHED PATIENT OFFICE VISIT - LEVEL II ([id number])  EXAM: Ecchymosis of the lateral right breast at the site of biopsy as well as at the right axilla is noted. Clean bandages are in place.  HISTORY OF PRESENT ILLNESS: Patient presents following an ultrasound-guided biopsy of a right breast mass and right axillary lymph node. She has mild soreness in the right axilla and in the right breast at site of biopsy.  CHIEF COMPLAINT: Status post ultrasound-guided biopsy of a right breast mass and right axillary lymph node.  PATHOLOGY: Pathology of the tissue samples from 9 o'clock in the right breast demonstrates invasive mammary carcinoma. Pathology of the right  axillary lymph node demonstrates metastatic carcinoma.  ASSESSMENT AND PLAN: ASSESSMENT AND PLAN Pathology is concordant with imaging findings. Surgical consultation is recommended. She is scheduled for the multidisciplinary clinic on 03/24/2013.   Electronically Signed   By: Donavan Burnet M.D.   On: 03/18/2013 13:33   [place] Rt Breast Bx W Loc Dev 1st Lesion Img Bx Spec US Guide  03/17/2013   CLINICAL DATA:  Patient with suspicious right breast mass and enlarged right axillary lymph node.  EXAM: ULTRASOUND GUIDED RIGHT BREAST CORE NEEDLE BIOPSY WITH VACUUM ASSIST  COMPARISON:  Previous exams.  PROCEDURE: I met with the patient and we discussed the procedure of ultrasound-guided biopsy, including benefits and alternatives. We discussed the high likelihood of a successful procedure. We discussed the risks of the procedure including infection, bleeding, tissue injury, clip migration, and inadequate sampling. Informed written consent was given. The usual time-out protocol was performed immediately prior to the procedure.  Using sterile technique and 2% Lidocaine as local anesthetic, under direct ultrasound visualization, a 12 gauge vacuum-assisteddevice was used to perform biopsy of suspicious right breast mass using a lateral approach. At the conclusion of the procedure, a wing shaped tissue marker clip was deployed into the biopsy cavity. Follow-up 2-view mammogram was performed and dictated separately.  Using sterile technique and 2% Lidocaine as local anesthetic, under direct ultrasound visualization, a 12 gauge vacuum-assisteddevice was used to perform biopsy of enlarged right axillary lymph node using a lateral approach.  IMPRESSION: Ultrasound-guided biopsy of suspicious right breast mass and enlarged right axillary lymph node. No apparent complications.   Electronically Signed   By: Lovey Newcomer M.D.   On: 03/17/2013 11:20   [place] Rt Breast Bx W Loc Dev Ea Add Lesion Img Bx Spec US Guide  03/17/2013    CLINICAL DATA:  Patient with suspicious right breast mass and enlarged right axillary lymph node.  EXAM: ULTRASOUND GUIDED RIGHT BREAST CORE NEEDLE BIOPSY WITH VACUUM ASSIST  COMPARISON:  Previous exams.  PROCEDURE: I met with the patient and we discussed the procedure of ultrasound-guided biopsy, including benefits and alternatives. We discussed the high likelihood of a successful procedure. We discussed the risks of the procedure including infection, bleeding, tissue injury, clip  migration, and inadequate sampling. Informed written consent was given. The usual time-out protocol was performed immediately prior to the procedure.  Using sterile technique and 2% Lidocaine as local anesthetic, under direct ultrasound visualization, a 12 gauge vacuum-assisteddevice was used to perform biopsy of suspicious right breast mass using a lateral approach. At the conclusion of the procedure, a wing shaped tissue marker clip was deployed into the biopsy cavity. Follow-up 2-view mammogram was performed and dictated separately.  Using sterile technique and 2% Lidocaine as local anesthetic, under direct ultrasound visualization, a 12 gauge vacuum-assisteddevice was used to perform biopsy of enlarged right axillary lymph node using a lateral approach.  IMPRESSION: Ultrasound-guided biopsy of suspicious right breast mass and enlarged right axillary lymph node. No apparent complications.   Electronically Signed   By: Lovey Newcomer M.D.   On: 03/17/2013 11:20   Mm Rt Plc Breast Loc Dev   1st Lesion  Inc Mammo Guide  03/30/2013   CLINICAL DATA:  Patient with right breast carcinoma and positive right axillary lymph node.  EXAM: ULTRASOUND NEEDLE LOCALIZATION OF THE RIGHT BREAST AND RIGHT AXILLA  COMPARISON:  Previous exams.  FINDINGS: Patient presents for needle localization prior to lumpectomy and right axillary lymph node excision. I met with the patient and we discussed the procedure of needle localization including benefits and  alternatives. We discussed the high likelihood of a successful procedure. We discussed the risks of the procedure, including infection, bleeding, tissue injury, and further surgery. Informed, written consent was given.  The usual time-out protocol was performed immediately prior to the procedure.  Using ultrasound guidance, sterile technique, 2% lidocaine and a 7 cm modified Kopans needle was used to localize the abnormal right axillary lymph node and a 5 cm modified Kopan's needle was used to localize the right breast mass at 9 o'clock. A lateral to medial approach was used for both the node and mass. The films are marked for Dr. Donne Hazel.  Specimen radiograph is performed and confirms the clip and hookwire to be present in the tissue sample. The specimen is marked for pathology. The localized axillary lymph node specimen was not radiographed.  IMPRESSION: Needle localization of the right breast and an abnormal right axillary lymph node. No apparent complications.   Electronically Signed   By: Donavan Burnet M.D.   On: 03/30/2013 16:48   [place] Rt Plc Breast Loc Dev   1st Lesion  Inc US Guide  03/30/2013   CLINICAL DATA:  Patient with right breast carcinoma and positive right axillary lymph node.  EXAM: ULTRASOUND NEEDLE LOCALIZATION OF THE RIGHT BREAST AND RIGHT AXILLA  COMPARISON:  Previous exams.  FINDINGS: Patient presents for needle localization prior to lumpectomy and right axillary lymph node excision. I met with the patient and we discussed the procedure of needle localization including benefits and alternatives. We discussed the high likelihood of a successful procedure. We discussed the risks of the procedure, including infection, bleeding, tissue injury, and further surgery. Informed, written consent was given.  The usual time-out protocol was performed immediately prior to the procedure.  Using ultrasound guidance, sterile technique, 2% lidocaine and a 7 cm modified Kopans needle was used to  localize the abnormal right axillary lymph node and a 5 cm modified Kopan's needle was used to localize the right breast mass at 9 o'clock. A lateral to medial approach was used for both the node and mass. The films are marked for Dr. Donne Hazel.  Specimen radiograph is performed and confirms the clip and  hookwire to be present in the tissue sample. The specimen is marked for pathology. The localized axillary lymph node specimen was not radiographed.  IMPRESSION: Needle localization of the right breast and an abnormal right axillary lymph node. No apparent complications.   Electronically Signed   By: Donavan Burnet M.D.   On: 03/30/2013 16:48   [place] Rt Plc Breast Loc Dev   1st Lesion  Inc US Guide  03/30/2013   CLINICAL DATA:  Patient with right breast carcinoma and positive right axillary lymph node.  EXAM: ULTRASOUND NEEDLE LOCALIZATION OF THE RIGHT BREAST AND RIGHT AXILLA  COMPARISON:  Previous exams.  FINDINGS: Patient presents for needle localization prior to lumpectomy and right axillary lymph node excision. I met with the patient and we discussed the procedure of needle localization including benefits and alternatives. We discussed the high likelihood of a successful procedure. We discussed the risks of the procedure, including infection, bleeding, tissue injury, and further surgery. Informed, written consent was given.  The usual time-out protocol was performed immediately prior to the procedure.  Using ultrasound guidance, sterile technique, 2% lidocaine and a 7 cm modified Kopans needle was used to localize the abnormal right axillary lymph node and a 5 cm modified Kopan's needle was used to localize the right breast mass at 9 o'clock. A lateral to medial approach was used for both the node and mass. The films are marked for Dr. Donne Hazel.  Specimen radiograph is performed and confirms the clip and hookwire to be present in the tissue sample. The specimen is marked for pathology. The localized  axillary lymph node specimen was not radiographed.  IMPRESSION: Needle localization of the right breast and an abnormal right axillary lymph node. No apparent complications.   Electronically Signed   By: Donavan Burnet M.D.   On: 03/30/2013 16:48    ASSESSMENT/PLAN: 80 year old female with  #1 stage II (T1 N1) invasive ductal carcinoma, grade 3 ER PR positive HER-2/neu positive by repeat HER-2 receptor on final pathology. Postoperatively patient is doing well.  #2 we discussed role of adjuvant treatment. Due to the high-risk nature of her disease it is recommended that she proceed with some form of adjuvant antiestrogen therapy. We discussed the different regimen. Given her age I do think that she may benefit from Taxol and Herceptin combination. I would give her Taxol on a weekly basis for total of 12 weeks with Herceptin given weekly as well. We would then change the Herceptin to every 3 week 2 complete total of one year of therapy. She will need radiation therapy and she will be referred back to Dr. Thea Silversmith after completion of her chemotherapy. We also discussed role of antiestrogen therapy since her tumor is ER positive. She would be good candidate for aromatase inhibitor such as a remedy axilla 1 mg daily. This would start after completion of radiation therapy.  #3 patient will need chemotherapy teaching class, echocardiogram and cardiology evaluation, she will also need a Port-A-Cath placed. This will be placed at the time of her reexcision of the posterior margin by Dr. Donne Hazel.  #4 I will plan on seeing the patient back in early March 2015 to begin chemotherapy and Herceptin   All questions were answered. The patient knows to call the clinic with any problems, questions or concerns. We can certainly see the patient much sooner if necessary.  I spent 25 minutes counseling the patient face to face. The total time spent in the appointment was 30 minutes.  Marcy Panning,  MD Medical/Oncology [facility] [phone] (beeper) [phone] (Office)  04/15/2013, 5:39 AM

## 2013-04-15 NOTE — Anesthesia Procedure Notes (Signed)
Procedure Name: LMA Insertion Date/Time: 04/15/2013 4:57 PM Performed by: Bethan Adamek Pre-anesthesia Checklist: Patient identified, Emergency Drugs available, Suction available and Patient being monitored Patient Re-evaluated:Patient Re-evaluated prior to inductionOxygen Delivery Method: Circle System Utilized Preoxygenation: Pre-oxygenation with 100% oxygen Intubation Type: IV induction Ventilation: Mask ventilation without difficulty LMA: LMA inserted LMA Size: 4.0 Number of attempts: 1 Airway Equipment and Method: bite block Placement Confirmation: positive ETCO2 Tube secured with: Tape Dental Injury: Teeth and Oropharynx as per pre-operative assessment

## 2013-04-15 NOTE — Op Note (Addendum)
Preoperative diagnosis: Clinical stage II right breast cancer with positive margin on lumpectomy Postoperative diagnosis: Same as above Procedure: #1 left subclavian power port insertion #2 reexcision posterior margin right breast lumpectomy site Surgeon: Dr. Serita Grammes Anesthesia: Gen. Estimated blood loss: Minimal Drains: None Complications: None Specimens: Right breast tissue marked short stitch superior, long stitch lateral, double stitch deep Disposition to recovery stable Sponge and needle count was correct at completion  Indication: This is an 76 female who had an equivocal HER-2/neu on her initial pathology. She underwent lumpectomy and targeted axillary lymph node dissection. Her positive margin has some cancer cells present at. The HER-2/neu is also now positive. She has discussed with medical oncology beginning chemotherapy and Herceptin. I discussed also placement of a port as well as a reexcision of this margin.  Procedure: After informed consent was obtained the patient was taken to the operating room. She was given cefazolin. Sequential compression devices were on her legs. She was placed under general anesthesia without complication. Her arms were tucked appropriately padded. Her chest was prepped and draped in the standard sterile surgical fashion. Surgical timeout was performed.  I infiltrated Marcaine in the left chest as well as on the clavicle. I accessed her subclavian vein with some difficulty due to the fact that she was very dry. I then placed the wire and this was confirmed to be in position by fluoroscopy. I then made a pocket below this. I then tunneled the line between the 2 sites. I then dilated the tract. The peel-away sheath was then inserted under fluoroscopy. The wire assembly was removed. I then inserted the line. I removed the peel-away sheath. The line was pulled back to be in the distal cava. I then attached the port and sutured this into position in 2  places with 2-0 Prolene suture. This flushed easily and aspirated blood. I packed this with heparin. Final fluoroscopy showed that this had pulled back to be in more of the mid superior vena cava but was otherwise in good position without any kinks. I then closed with 3-0 Vicryl, 4-0 Monocryl, and Dermabond.  I then reopened her old lumpectomy incision. I released all the sutures and closed the cavity down. There was a hematoma that was present. I then excised the posterior margin all the way down to the chest wall. This was marked as above. This was not a very big area as I had removed some of it before. I then obtained hemostasis. I then closed the cavity with 2-0 Vicryl, 3-0 Vicryl, 4-0 Monocryl, and Dermabond. Steri-Strips are placed over this. Marcaine was infiltrated in this. She tolerated this well. A breast binder was placed. She was extubated and transferred to recovery stable.

## 2013-04-15 NOTE — H&P (View-Only) (Signed)
Subjective:     Patient ID: Sabrina [name], female   DOB: 11/24/1926, 81 y.o.   MRN: [id number]  HPI 89 yof s/p right lumpectomy, targeted node dissection with pathology showing what ends up being positive posterior margin.  She has a 1.4 cm IDC, grade III, er/pr positive and her 2 was initially equivocal but positive on final pathology result.  All other margins are clear  Review of Systems     Objective:   Physical Exam Healing right breast and axillary incisions without infection    Assessment:     Stage II right breast cancer    Plan:     Re-excision margins, port placement     We discussed indication to clear margin with excision.  She has been seen by med onc and has been recommended chemo/herceptin.  Will plan port placement at same time. Risks discussed

## 2013-04-15 NOTE — Progress Notes (Signed)
Assisted Dr. Ola Spurr with right, ultrasound guided, pectoralis block. Side rails up, monitors on throughout procedure. See vital signs in flow sheet. Tolerated Procedure well.

## 2013-04-15 NOTE — Anesthesia Postprocedure Evaluation (Signed)
  Anesthesia Post-op Note  Patient: Sabrina [name]  Procedure(s) Performed: Procedure(s): RE-EXCISION OF RIGHT BREAST  MARGINS (Right) INSERTION PORT-A-CATH (N/A)  Patient Location: PACU  Anesthesia Type:General  Level of Consciousness: awake, alert  and oriented  Airway and Oxygen Therapy: Patient Spontanous Breathing and Patient connected to nasal cannula oxygen  Post-op Pain: mild  Post-op Assessment: Post-op Vital signs reviewed, Patient's Cardiovascular Status Stable, Respiratory Function Stable, Patent Airway, No signs of Nausea or vomiting and Pain level controlled  Post-op Vital Signs: Reviewed and stable  Complications: No apparent anesthesia complications

## 2013-04-15 NOTE — Anesthesia Preprocedure Evaluation (Addendum)
Anesthesia Evaluation  Patient identified by MRN, date of birth, ID band Patient awake    Reviewed: Allergy & Precautions, H&P , NPO status , Patient's Chart, lab work & pertinent test results  Airway Mallampati: III TM Distance: >3 FB Neck ROM: Full    Dental no notable dental hx. (+) Teeth Intact, Dental Advisory Given   Pulmonary neg pulmonary ROS, former smoker,  breath sounds clear to auscultation  Pulmonary exam normal       Cardiovascular hypertension, On Medications Rhythm:Regular Rate:Normal     Neuro/Psych negative neurological ROS  negative psych ROS   GI/Hepatic negative GI ROS, Neg liver ROS,   Endo/Other  diabetes, Type 2, Oral Hypoglycemic AgentsMorbid obesity  Renal/GU Renal diseasenegative Renal ROS  negative genitourinary   Musculoskeletal   Abdominal   Peds  Hematology negative hematology ROS (+)   Anesthesia Other Findings   Reproductive/Obstetrics negative OB ROS                          Anesthesia Physical Anesthesia Plan  ASA: III  Anesthesia Plan: General and Regional   Post-op Pain Management:    Induction: Intravenous  Airway Management Planned: LMA  Additional Equipment:   Intra-op Plan:   Post-operative Plan: Extubation in OR  Informed Consent: I have reviewed the patients History and Physical, chart, labs and discussed the procedure including the risks, benefits and alternatives for the proposed anesthesia with the patient or authorized representative who has indicated his/her understanding and acceptance.   Dental advisory given  Plan Discussed with: CRNA  Anesthesia Plan Comments:         Anesthesia Quick Evaluation

## 2013-04-16 ENCOUNTER — Ambulatory Visit: Payer: Medicare Other | Admitting: Oncology

## 2013-04-16 ENCOUNTER — Encounter (HOSPITAL_BASED_OUTPATIENT_CLINIC_OR_DEPARTMENT_OTHER): Payer: Self-pay | Admitting: General Surgery

## 2013-04-16 LAB — POCT HEMOGLOBIN-HEMACUE: Hemoglobin: 11.4 g/dL — ABNORMAL LOW (ref 12.0–15.0)

## 2013-04-16 NOTE — Discharge Instructions (Addendum)
    PORT-A-CATH: POST OP INSTRUCTIONS  Always review your discharge instruction sheet given to you by the facility where your surgery was performed.   1. A prescription for pain medication may be given to you upon discharge. Take your pain medication as prescribed, if needed. If narcotic pain medicine is not needed, then you make take acetaminophen (Tylenol) or ibuprofen (Advil) as needed.  2. Take your usually prescribed medications unless otherwise directed. 3. If you need a refill on your pain medication, please contact our office. All narcotic pain medicine now requires a paper prescription.  Phoned in and fax refills are no longer allowed by law.  Prescriptions will not be filled after 5 pm or on weekends.  4. You should follow a light diet for the remainder of the day after your procedure. 5. Most patients will experience some mild swelling and/or bruising in the area of the incision. It may take several days to resolve. 6. It is common to experience some constipation if taking pain medication after surgery. Increasing fluid intake and taking a stool softener (such as Colace) will usually help or prevent this problem from occurring. A mild laxative (Milk of Magnesia or Miralax) should be taken according to package directions if there are no bowel movements after 48 hours.  7. Unless discharge instructions indicate otherwise, you may remove your bandages 48 hours after surgery, and you may shower at that time. You may have steri-strips (small white skin tapes) in place directly over the incision.  These strips should be left on the skin for 7-10 days.  If your surgeon used Dermabond (skin glue) on the incision, you may shower in 24 hours.  The glue will flake off over the next 2-3 weeks.  8. If your port is left accessed at the end of surgery (needle left in port), the dressing cannot get wet and should only by changed by a healthcare professional. When the port is no longer accessed (when the  needle has been removed), follow step 7.   9. ACTIVITIES:  Limit activity involving your arms for the next 72 hours. Do no strenuous exercise or activity for 1 week. You may drive when you are no longer taking prescription pain medication, you can comfortably wear a seatbelt, and you can maneuver your car. 10.You may need to see your doctor in the office for a follow-up appointment.  Please       check with your doctor.  11.When you receive a new Port-a-Cath, you will get a product guide and        ID card.  Please keep them in case you need them.  WHEN TO CALL YOUR DOCTOR ([phone]): 1. Fever over 101.0 2. Chills 3. Continued bleeding from incision 4. Increased redness and tenderness at the site 5. Shortness of breath, difficulty breathing   The clinic staff is available to answer your questions during regular business hours. Please don't hesitate to call and ask to speak to one of the nurses or medical assistants for clinical concerns. If you have a medical emergency, go to the nearest emergency room or call 911.  A surgeon from [facility] is always on call at the hospital.     For further information, please visit [email or web address]      

## 2013-04-16 NOTE — Discharge Summary (Signed)
Physician Discharge Summary  Patient ID: Sabrina [name] MRN: [id number] DOB/AGE: 1926/05/27 81 y.o.  Admit date: 04/15/2013 Discharge date: 04/16/2013  Admission Diagnoses: Breast cancer Discharge Diagnoses:  Active Problems:   Breast cancer   Discharged Condition: good  Hospital Course: 22 yof who was admitted and underwent port placement with re-excision of posterior margin on the right side.  She has done well and will be discharged today.  Consults: None  Significant Diagnostic Studies: none  Treatments: surgery: left subclavian port placement, re-excision right breast posterior margin  Discharge Exam: Blood pressure 151/60, pulse 76, temperature 97.5 F (36.4 C), temperature source Oral, resp. rate 20, height 4' 10.5" (1.486 m), weight 185 lb (83.915 kg), SpO2 94.00%. Surgical sites clean without infection  Disposition: 01-Home or Self Care   Future Appointments Provider Department Dept Phone   04/20/2013 12:00 PM [facility] [phone]   04/22/2013 1:00 PM [facility] [phone]   04/22/2013 2:00 PM [place] [phone]   05/10/2013 12:15 PM [facility] [phone]   05/10/2013 12:45 PM Deatra Robinson, MD [facility] [phone]   05/10/2013 1:45 PM [facility] [phone]   05/31/2013 11:00 AM Karen P Powell, [facility] [phone]   05/31/2013 12:00 PM [facility] [phone]   05/31/2013 12:30 PM [facility] [phone]       Medication List    ASK your doctor about these medications       atorvastatin 10 MG tablet  Commonly known as:  LIPITOR  Take  10 mg by mouth daily.     Biotin 1000 MCG tablet  Take 1,000 mcg by mouth daily.     CITRACAL PO  Take 1 tablet by mouth daily.     cloNIDine 0.2 MG tablet  Commonly known as:  CATAPRES  Take 0.2 mg by mouth 2 (two) times daily.     dexamethasone 4 MG tablet  Commonly known as:  DECADRON  Take 2 tablets (8 mg total) by mouth 2 (two) times daily with a meal. Start the day after chemotherapy for 2 days. Take with food.     furosemide 40 MG tablet  Commonly known as:  LASIX  Take 40 mg by mouth daily.     HYDROcodone-acetaminophen 5-325 MG per tablet  Commonly known as:  NORCO/VICODIN  Take 1-2 tablets by mouth every 4 (four) hours as needed for moderate pain.     IRON PO  Take 65 mg by mouth daily.     LORazepam 0.5 MG tablet  Commonly known as:  ATIVAN  Take 1 tablet (0.5 mg total) by mouth every 6 (six) hours as needed (Nausea or vomiting).     metFORMIN 850 MG tablet  Commonly known as:  GLUCOPHAGE  Take 850 mg by mouth 2 (two) times daily with a meal.     MULTIVITAMIN PO  Take 1 tablet by mouth daily.     ondansetron 8 MG tablet  Commonly known as:  ZOFRAN  Take 1 tablet (8 mg total) by mouth 2 (two) times daily. Start the day after chemo for 2 days. Then take as needed for nausea or vomiting.     prochlorperazine 10 MG tablet  Commonly known as:  COMPAZINE  Take 1 tablet (10 mg total) by mouth every 6 (six) hours as needed (Nausea or vomiting).     traMADol 50 MG tablet  Commonly known as:  ULTRAM  Take 50 mg by mouth 2 (two) times daily.     verapamil 240 MG 24 hr capsule  Commonly known as:  VERELAN PM  Take 240 mg by mouth daily.     Vitamin D-3 1000 UNITS Caps  Take 1,000 Units by mouth daily.           Follow-up Information   Follow up with [facility], MD In 2 weeks.   Specialty:  General Surgery   Contact information:   [address] [place] [address] [phone]       Signed: Rolm Bookbinder 04/16/2013, 7:06  AM

## 2013-04-20 ENCOUNTER — Other Ambulatory Visit: Payer: Medicare Other

## 2013-04-20 ENCOUNTER — Telehealth: Payer: Self-pay | Admitting: Oncology

## 2013-04-20 NOTE — Telephone Encounter (Signed)
pt called to r/s 2/17 appt to 2/20.

## 2013-04-22 ENCOUNTER — Ambulatory Visit (HOSPITAL_BASED_OUTPATIENT_CLINIC_OR_DEPARTMENT_OTHER)
Admission: RE | Admit: 2013-04-22 | Discharge: 2013-04-22 | Disposition: A | Discharge status: Home / Self Care | Payer: Medicare Other | Source: Ambulatory Visit | Attending: Internal Medicine | Admitting: Internal Medicine

## 2013-04-22 ENCOUNTER — Ambulatory Visit (HOSPITAL_COMMUNITY)
Admission: RE | Admit: 2013-04-22 | Discharge: 2013-04-22 | Disposition: A | Discharge status: Home / Self Care | Payer: Medicare Other | Source: Ambulatory Visit | Attending: Geriatric Medicine | Admitting: Geriatric Medicine

## 2013-04-22 VITALS — BP 154/62 | HR 81 | Ht 58.5 in | Wt 186.4 lb

## 2013-04-22 DIAGNOSIS — C50419 Malignant neoplasm of upper-outer quadrant of unspecified female breast: Secondary | ICD-10-CM

## 2013-04-22 DIAGNOSIS — I350 Nonrheumatic aortic (valve) stenosis: Secondary | ICD-10-CM

## 2013-04-22 DIAGNOSIS — C50919 Malignant neoplasm of unspecified site of unspecified female breast: Secondary | ICD-10-CM | POA: Insufficient documentation

## 2013-04-22 DIAGNOSIS — I059 Rheumatic mitral valve disease, unspecified: Secondary | ICD-10-CM | POA: Insufficient documentation

## 2013-04-22 DIAGNOSIS — I359 Nonrheumatic aortic valve disorder, unspecified: Secondary | ICD-10-CM | POA: Insufficient documentation

## 2013-04-22 DIAGNOSIS — R609 Edema, unspecified: Secondary | ICD-10-CM

## 2013-04-22 DIAGNOSIS — C50411 Malignant neoplasm of upper-outer quadrant of right female breast: Secondary | ICD-10-CM

## 2013-04-22 DIAGNOSIS — I10 Essential (primary) hypertension: Secondary | ICD-10-CM | POA: Insufficient documentation

## 2013-04-22 DIAGNOSIS — I519 Heart disease, unspecified: Secondary | ICD-10-CM | POA: Insufficient documentation

## 2013-04-22 DIAGNOSIS — I079 Rheumatic tricuspid valve disease, unspecified: Secondary | ICD-10-CM | POA: Insufficient documentation

## 2013-04-22 DIAGNOSIS — R600 Localized edema: Secondary | ICD-10-CM | POA: Insufficient documentation

## 2013-04-22 DIAGNOSIS — E119 Type 2 diabetes mellitus without complications: Secondary | ICD-10-CM | POA: Insufficient documentation

## 2013-04-22 DIAGNOSIS — I517 Cardiomegaly: Secondary | ICD-10-CM | POA: Insufficient documentation

## 2013-04-22 MED ORDER — POTASSIUM CHLORIDE ER 20 MEQ PO TBCR: 20.0000 meq | EXTENDED_RELEASE_TABLET | Freq: Every day | ORAL | Status: DC

## 2013-04-22 NOTE — Addendum Note (Signed)
Encounter addended by: Kerry Dory, CMA on: 04/22/2013  3:39 PM<BR>     Documentation filed: Patient Instructions Section, Orders

## 2013-04-22 NOTE — Progress Notes (Signed)
Patient ID: [name], female   DOB: Aug 17, 1926, 80 y.o.   MRN: [id number] PCP: Dr Felipa Eth General Surgery :Dr   HPI: Ms Kissinger is an 80 year old with a history of DM, HTN, S/P R breast  lumpectomy and porta cath. Stage II (T1 N1) invasive ductal carcinoma,grade 3 ER PR positive HER-2/neu positive   Plan to Taxol on a weekly basis for total of 12 weeks with Herceptin given weekly as well. We would then change the Herceptin to every 3 week to complete total of one year of therapy.Plan to start March   Echo 04/21/13; EF 60-65% Grade I DD. Significant MAC. Mild aortic stenosis. Lateral s' 8.6 GLS -15%  She presents as new patient at the request of Dr Chancy Milroy to enroll in the cardio-oncology clinic. Denies any h/o known heart disease. Denies CP. Has trouble with climbing stairs "hard to pull my weight up the stairs." Occasional LE edema resolves with lasix.   FH: No family history of heart disease.  SH: Lives at home with husband. Nonsmoker.   Review of Systems:     Cardiac Review of Systems: {Y] = yes [ ]  = no  Chest Pain [    ]  Resting SOB [   ] Exertional SOB  [  ]  Orthopnea [  ]   Pedal Edema [   ]    Palpitations [  ] Syncope  [  ]   Presyncope [   ]  General Review of Systems: [Y] = yes [  ]=no Constitional: recent weight change [  ]; anorexia [  ]; fatigue [ y ]; nausea [  ]; night sweats [  ]; fever [  ]; or chills [  ];                                                                                                                                          Dental: poor dentition[  ];  Eye : blurred vision [  ]; diplopia [   ]; vision changes [  ];  Amaurosis fugax[  ]; Resp: cough [ y ];  wheezing[  ];  hemoptysis[  ]; shortness of breath[  ]; paroxysmal nocturnal dyspnea[  ]; dyspnea on exertion[  ]; or orthopnea[  ];  GI:  gallstones[  ], vomiting[  ];  dysphagia[  ]; melena[  ];  hematochezia [  ]; heartburn[  ];   Hx of  Colonoscopy[  ]; GU: kidney stones [  ]; hematuria[  ];    dysuria [  ];  nocturia[  ];  history of     obstruction [  ];                 Skin: rash, swelling[  ];, hair loss[  ];  peripheral edema[ y ];  or itching[  ]; Musculosketetal: myalgias[  ];  joint swelling[  ];  joint erythema[  ];  joint pain[y  ];  back pain[  ];  Heme/Lymph: bruising[  ];  bleeding[  ];  anemia[  ];  Neuro: TIA[  ];  headaches[  ];  stroke[  ];  vertigo[  ];  seizures[  ];   paresthesias[  ];  difficulty walking[  ];  Psych:depression[  ]; anxiety[  ];  Endocrine: diabetes[ y ];  thyroid dysfunction[  ];  Other:    Past Medical History  Diagnosis Date  . Breast cancer 03/2013    right  . Heart murmur     no known problems; states did not know she had murmur until age 62  . Immature cataract   . Arthritis     neck  . Wears partial dentures     lower  . Dental crowns present   . Hypertension     fluctuates, especially when stressed; has been on med. > 20 yr.  . Non-insulin dependent type 2 diabetes mellitus   . High cholesterol   . Chronic kidney disease (CKD), stage III (moderate)     nephrologist, Dr. [name]    Current Outpatient Prescriptions  Medication Sig Dispense Refill  . atorvastatin (LIPITOR) 10 MG tablet Take 10 mg by mouth daily.      . Biotin 1000 MCG tablet Take 1,000 mcg by mouth daily.      . Calcium Citrate (CITRACAL PO) Take 1 tablet by mouth daily.       . Cholecalciferol (VITAMIN D-3) 1000 UNITS CAPS Take 1,000 Units by mouth daily.      . cloNIDine (CATAPRES) 0.2 MG tablet Take 0.2 mg by mouth 2 (two) times daily.      [name] dexamethasone (DECADRON) 4 MG tablet Take 2 tablets (8 mg total) by mouth 2 (two) times daily with a meal. Start the day after chemotherapy for 2 days. Take with food.  30 tablet  1  . furosemide (LASIX) 40 MG tablet Take 40 mg by mouth daily.      [name] HYDROcodone-acetaminophen (NORCO/VICODIN) 5-325 MG per tablet Take 1-2 tablets by mouth every 4 (four) hours as needed for moderate pain.  20 tablet  0  .  IRON PO Take 65 mg by mouth daily.      [name] LORazepam (ATIVAN) 0.5 MG tablet Take 1 tablet (0.5 mg total) by mouth every 6 (six) hours as needed (Nausea or vomiting).  30 tablet  0  . metFORMIN (GLUCOPHAGE) 850 MG tablet Take 850 mg by mouth 2 (two) times daily with a meal.      . Multiple Vitamins-Minerals (MULTIVITAMIN PO) Take 1 tablet by mouth daily.       . ondansetron (ZOFRAN) 8 MG tablet Take 1 tablet (8 mg total) by mouth 2 (two) times daily. Start the day after chemo for 2 days. Then take as needed for nausea or vomiting.  30 tablet  1  . prochlorperazine (COMPAZINE) 10 MG tablet Take 1 tablet (10 mg total) by mouth every 6 (six) hours as needed (Nausea or vomiting).  30 tablet  1  . traMADol (ULTRAM) 50 MG tablet Take 50 mg by mouth 2 (two) times daily.      . verapamil (VERELAN PM) 240 MG 24 hr capsule Take 240 mg by mouth daily.        No current facility-administered medications for this encounter.     No Known Allergies  History   Social History  . Marital Status: Married    Spouse  Name: N/A    Number of Children: N/A  . Years of Education: N/A   Occupational History  . Not on file.   Social History Main Topics  . Smoking status: Former Research scientist (life sciences)  . Smokeless tobacco: Never Used     Comment: quit smoking in 1965  . Alcohol Use: 0.0 oz/week     Comment: 2-3 glasses wine/week  . Drug Use: No  . Sexual Activity: Not Currently   Other Topics Concern  . Not on file   Social History Narrative  . No narrative on file    No family history on file.  PHYSICAL EXAM: Filed Vitals:   04/22/13 1437  BP: 154/62  Pulse: 81   General:  Elderly. No respiratory difficulty HEENT: normal Neck: supple. JVP 7. Carotids 2+ bilat; no bruits. No lymphadenopathy or thryomegaly appreciated. Cor: PMI nondisplaced. Regular rate & rhythm. No rubs, gallops 2/6 AS murmur Lungs: clear Abdomen: obese soft, nontender, nondistended. No hepatosplenomegaly. No bruits or masses. Good bowel  sounds. Extremities: no cyanosis, clubbing, rash, 1-2+ edema Neuro: alert & oriented x 3, cranial nerves grossly intact. moves all 4 extremities w/o difficulty. Affect pleasant.    No results found for this or any previous visit (from the past 24 hour(s)). No results found.   ASSESSMENT & PLAN: 1. Breast cancer - Explained incidence of Herceptin cardiotoxicity and role of Cardio-oncology clinic at length. Echo images reviewed personally. All parameters stable. Reviewed signs and symptoms of HF to look for. Continue Herceptin. Follow-up with echo in 3 months. 2. HTN - BP still a little elevated. Given DM2 could consider ACE-I or ARB (previously had cough with ACE) 3. DM2 4. LE edema - suspect she has component of diastolic HF. Encouraged her to take extra lasix as needed for several days to keep fluid down. Watch salty foods. Take Kcl 20 with each dose of lasix.  5. Aortic stenosis - mild. Continue to follow.   Zayquan Bogard,MD 3:15 PM

## 2013-04-22 NOTE — Patient Instructions (Addendum)
Take Lasix 20 mg as needed, when you have to take Lasix also take Potassium 20MeQ  Follow up in 3 months with an ECHO

## 2013-04-23 ENCOUNTER — Other Ambulatory Visit: Payer: Medicare Other

## 2013-04-23 ENCOUNTER — Telehealth: Payer: Self-pay | Admitting: *Deleted

## 2013-04-23 NOTE — Addendum Note (Signed)
Encounter addended by: Ladoris Gene, CCT on: 04/23/2013  9:07 AM<BR>     Documentation filed: Charges VN

## 2013-04-30 ENCOUNTER — Telehealth (INDEPENDENT_AMBULATORY_CARE_PROVIDER_SITE_OTHER): Payer: Self-pay

## 2013-04-30 ENCOUNTER — Encounter (INDEPENDENT_AMBULATORY_CARE_PROVIDER_SITE_OTHER): Payer: Medicare Other | Admitting: General Surgery

## 2013-04-30 NOTE — Discharge Summary (Signed)
Physician Discharge Summary  Patient ID: KHOLE MEGA MRN: [id number] DOB/AGE: [birth year] 80 y.o.  Admit date: 03/30/2013 Discharge date: 04/30/2013  Admission Diagnoses: Breast cancer  Discharge Diagnoses:  Active Problems:   * No active hospital problems. *   Discharged Condition: good  Hospital Course: 70 yof admitted and underwent lumpectomy with targeted axillary dissection. She is discharged home the following day doing well with her drain in place.  Consults: None  Significant Diagnostic Studies: none  Treatments: surgery: lumpectomy, targeted axillary node excision    Disposition: 01-Home or Self Care   Future Appointments Provider Department Dept Phone   05/03/2013 10:00 AM [facility] [phone]   05/10/2013 12:15 PM [facility] [phone]   05/10/2013 12:45 PM Deatra Robinson, MD [facility] [phone]   05/10/2013 1:45 PM [facility] [phone]   05/31/2013 11:00 AM Karen P Powell, [facility] [phone]   05/31/2013 12:00 PM [facility] [phone]   05/31/2013 12:30 PM [facility] [phone]       Medication List         atorvastatin 10 MG tablet  Commonly known as:  LIPITOR  Take 10 mg by mouth daily.     Biotin 1000 MCG tablet  Take 1,000 mcg by mouth daily.     CITRACAL PO  Take 1 tablet by mouth daily.     cloNIDine 0.2 MG tablet  Commonly known as:  CATAPRES  Take 0.2 mg by mouth 2 (two) times daily.     furosemide 40 MG tablet  Commonly known as:  LASIX  Take 40 mg by mouth daily.     HYDROcodone-acetaminophen 5-325 MG per tablet  Commonly known as:  NORCO/VICODIN  Take 1-2 tablets by mouth every 4 (four) hours as needed for  moderate pain.     IRON PO  Take 65 mg by mouth daily.     metFORMIN 850 MG tablet  Commonly known as:  GLUCOPHAGE  Take 850 mg by mouth 2 (two) times daily with a meal.     MULTIVITAMIN PO  Take 1 tablet by mouth daily.     traMADol 50 MG tablet  Commonly known as:  ULTRAM  Take 50 mg by mouth 2 (two) times daily.     verapamil 240 MG 24 hr capsule  Commonly known as:  VERELAN PM  Take 240 mg by mouth daily.     Vitamin D-3 1000 UNITS Caps  Take 1,000 Units by mouth daily.           Follow-up Information   Follow up with [facility], MD In 1 week.   Specialty:  General Surgery   Contact information:   [address] [place] [address] [phone]       Signed: Rolm Bookbinder 04/30/2013, 10:11 AM

## 2013-04-30 NOTE — Telephone Encounter (Signed)
Called pt and rescheduled her appt. Gave her path report with clear margins

## 2013-05-03 ENCOUNTER — Other Ambulatory Visit: Payer: Medicare Other

## 2013-05-03 ENCOUNTER — Other Ambulatory Visit: Payer: Self-pay | Admitting: *Deleted

## 2013-05-03 DIAGNOSIS — C50411 Malignant neoplasm of upper-outer quadrant of right female breast: Secondary | ICD-10-CM

## 2013-05-03 MED ORDER — LIDOCAINE-PRILOCAINE 2.5-2.5 % EX CREA: TOPICAL_CREAM | CUTANEOUS | Status: DC

## 2013-05-03 MED ORDER — VALACYCLOVIR HCL 500 MG PO TABS: 500.0000 mg | ORAL_TABLET | Freq: Every day | ORAL | Status: DC

## 2013-05-03 NOTE — Telephone Encounter (Signed)
This RN e-scribed Emla cream and Valtrex to patient's pharmacy. Patient verbalized understanding.

## 2013-05-04 ENCOUNTER — Other Ambulatory Visit: Payer: Self-pay

## 2013-05-04 DIAGNOSIS — C50411 Malignant neoplasm of upper-outer quadrant of right female breast: Secondary | ICD-10-CM

## 2013-05-04 MED ORDER — LORAZEPAM 0.5 MG PO TABS: 0.5000 mg | ORAL_TABLET | Freq: Four times a day (QID) | ORAL | Status: DC | PRN

## 2013-05-04 NOTE — Telephone Encounter (Signed)
Prescription from 04-15-13 never received by [name].  This is her first Ativan prescription

## 2013-05-05 ENCOUNTER — Ambulatory Visit (INDEPENDENT_AMBULATORY_CARE_PROVIDER_SITE_OTHER): Payer: Medicare Other | Admitting: General Surgery

## 2013-05-05 ENCOUNTER — Encounter (INDEPENDENT_AMBULATORY_CARE_PROVIDER_SITE_OTHER): Payer: Self-pay | Admitting: General Surgery

## 2013-05-05 VITALS — BP 144/80 | HR 80 | Temp 98.8°F | Resp 20 | Ht 64.0 in | Wt 183.0 lb

## 2013-05-05 DIAGNOSIS — Z09 Encounter for follow-up examination after completed treatment for conditions other than malignant neoplasm: Secondary | ICD-10-CM

## 2013-05-06 NOTE — Progress Notes (Signed)
Subjective:     Patient ID: [name], female   DOB: [birth year], 80 y.o.   MRN: [id number]  HPI 65 yof who underwent left breast lumpectomy/targeted axillary node removal and port with positive margin. She had this re-excised and is negative.  She returns today due to start chemotherapy on Monday doing well without complaint.  Review of Systems     Objective:   Physical Exam    left port incision healing well without infection Right breast and axillary incisions healing well. Assessment:     Stage II breast cancer s/p breast conservation therapy/port     Plan:     She is doing well. I gave her info on abc class and exercises.  She is ready for chemo and I can see back in 6 months or after chemo/radiotherapy.

## 2013-05-10 ENCOUNTER — Encounter: Payer: Self-pay | Admitting: Oncology

## 2013-05-10 ENCOUNTER — Ambulatory Visit (HOSPITAL_BASED_OUTPATIENT_CLINIC_OR_DEPARTMENT_OTHER): Payer: Medicare Other

## 2013-05-10 ENCOUNTER — Telehealth: Payer: Self-pay | Admitting: *Deleted

## 2013-05-10 ENCOUNTER — Ambulatory Visit (HOSPITAL_BASED_OUTPATIENT_CLINIC_OR_DEPARTMENT_OTHER): Payer: Medicare Other | Admitting: Oncology

## 2013-05-10 ENCOUNTER — Other Ambulatory Visit (HOSPITAL_BASED_OUTPATIENT_CLINIC_OR_DEPARTMENT_OTHER): Payer: Medicare Other

## 2013-05-10 ENCOUNTER — Telehealth: Payer: Self-pay | Admitting: Oncology

## 2013-05-10 VITALS — BP 138/48 | HR 58 | Temp 97.5°F | Resp 16

## 2013-05-10 VITALS — BP 185/75 | HR 105 | Temp 98.0°F | Resp 20 | Ht 64.0 in | Wt 185.2 lb

## 2013-05-10 DIAGNOSIS — C773 Secondary and unspecified malignant neoplasm of axilla and upper limb lymph nodes: Secondary | ICD-10-CM

## 2013-05-10 DIAGNOSIS — C50411 Malignant neoplasm of upper-outer quadrant of right female breast: Secondary | ICD-10-CM

## 2013-05-10 DIAGNOSIS — C50919 Malignant neoplasm of unspecified site of unspecified female breast: Secondary | ICD-10-CM

## 2013-05-10 DIAGNOSIS — Z5112 Encounter for antineoplastic immunotherapy: Secondary | ICD-10-CM

## 2013-05-10 DIAGNOSIS — Z170 Estrogen receptor positive status [ER+]: Secondary | ICD-10-CM

## 2013-05-10 DIAGNOSIS — Z5111 Encounter for antineoplastic chemotherapy: Secondary | ICD-10-CM

## 2013-05-10 LAB — COMPREHENSIVE METABOLIC PANEL (CC13)
ALT: 20 U/L (ref 0–55)
AST: 15 U/L (ref 5–34)
Albumin: 3.8 g/dL (ref 3.5–5.0)
Alkaline Phosphatase: 112 U/L (ref 40–150)
Anion Gap: 9 mEq/L (ref 3–11)
BUN: 44.5 mg/dL — ABNORMAL HIGH (ref 7.0–26.0)
CO2: 28 mEq/L (ref 22–29)
Calcium: 9.7 mg/dL (ref 8.4–10.4)
Chloride: 102 mEq/L (ref 98–109)
Creatinine: 1.8 mg/dL — ABNORMAL HIGH (ref 0.6–1.1)
Glucose: 221 mg/dl — ABNORMAL HIGH (ref 70–140)
Potassium: 4.2 mEq/L (ref 3.5–5.1)
Sodium: 139 mEq/L (ref 136–145)
Total Bilirubin: 0.47 mg/dL (ref 0.20–1.20)
Total Protein: 7.1 g/dL (ref 6.4–8.3)

## 2013-05-10 LAB — CBC WITH DIFFERENTIAL/PLATELET
BASO%: 0.4 % (ref 0.0–2.0)
Basophils Absolute: 0 10*3/uL (ref 0.0–0.1)
EOS%: 1.7 % (ref 0.0–7.0)
Eosinophils Absolute: 0.1 10*3/uL (ref 0.0–0.5)
HCT: 33 % — ABNORMAL LOW (ref 34.8–46.6)
HGB: 10.9 g/dL — ABNORMAL LOW (ref 11.6–15.9)
LYMPH%: 29.7 % (ref 14.0–49.7)
MCH: 29.9 pg (ref 25.1–34.0)
MCHC: 33 g/dL (ref 31.5–36.0)
MCV: 90.7 fL (ref 79.5–101.0)
MONO#: 0.5 10*3/uL (ref 0.1–0.9)
MONO%: 6.9 % (ref 0.0–14.0)
NEUT#: 4.2 10*3/uL (ref 1.5–6.5)
NEUT%: 61.3 % (ref 38.4–76.8)
Platelets: 264 10*3/uL (ref 145–400)
RBC: 3.64 10*6/uL — ABNORMAL LOW (ref 3.70–5.45)
RDW: 13.5 % (ref 11.2–14.5)
WBC: 6.9 10*3/uL (ref 3.9–10.3)
lymph#: 2.1 10*3/uL (ref 0.9–3.3)
nRBC: 0 % (ref 0–0)

## 2013-05-10 MED ORDER — SODIUM CHLORIDE 0.9 % IV SOLN
80.0000 mg/m2 | Freq: Once | INTRAVENOUS | Status: AC
  Administered 2013-05-10: 156 mg via INTRAVENOUS
  Filled 2013-05-10: qty 26

## 2013-05-10 MED ORDER — ONDANSETRON 8 MG/50ML IVPB (CHCC)
8.0000 mg | Freq: Once | INTRAVENOUS | Status: AC
  Administered 2013-05-10: 8 mg via INTRAVENOUS

## 2013-05-10 MED ORDER — DEXAMETHASONE SODIUM PHOSPHATE 20 MG/5ML IJ SOLN
20.0000 mg | Freq: Once | INTRAMUSCULAR | Status: AC
  Administered 2013-05-10: 20 mg via INTRAVENOUS

## 2013-05-10 MED ORDER — SODIUM CHLORIDE 0.9 % IJ SOLN
10.0000 mL | INTRAMUSCULAR | Status: DC | PRN
  Administered 2013-05-10: 10 mL
  Filled 2013-05-10: qty 10

## 2013-05-10 MED ORDER — FAMOTIDINE IN NACL 20-0.9 MG/50ML-% IV SOLN
20.0000 mg | Freq: Once | INTRAVENOUS | Status: AC
  Administered 2013-05-10: 20 mg via INTRAVENOUS

## 2013-05-10 MED ORDER — DEXAMETHASONE SODIUM PHOSPHATE 20 MG/5ML IJ SOLN
INTRAMUSCULAR | Status: AC
  Filled 2013-05-10: qty 5

## 2013-05-10 MED ORDER — TRASTUZUMAB CHEMO INJECTION 440 MG
4.0000 mg/kg | Freq: Once | INTRAVENOUS | Status: AC
  Administered 2013-05-10: 336 mg via INTRAVENOUS
  Filled 2013-05-10: qty 16

## 2013-05-10 MED ORDER — FAMOTIDINE IN NACL 20-0.9 MG/50ML-% IV SOLN
INTRAVENOUS | Status: AC
  Filled 2013-05-10: qty 50

## 2013-05-10 MED ORDER — ACETAMINOPHEN 325 MG PO TABS
650.0000 mg | ORAL_TABLET | Freq: Once | ORAL | Status: AC
Start: 2013-05-10 — End: 2013-05-10
  Administered 2013-05-10: 650 mg via ORAL

## 2013-05-10 MED ORDER — DIPHENHYDRAMINE HCL 50 MG/ML IJ SOLN
50.0000 mg | Freq: Once | INTRAMUSCULAR | Status: AC
  Administered 2013-05-10: 50 mg via INTRAVENOUS

## 2013-05-10 MED ORDER — DIPHENHYDRAMINE HCL 50 MG/ML IJ SOLN
INTRAMUSCULAR | Status: AC
  Filled 2013-05-10: qty 1

## 2013-05-10 MED ORDER — ACETAMINOPHEN 325 MG PO TABS
ORAL_TABLET | ORAL | Status: AC
  Filled 2013-05-10: qty 2

## 2013-05-10 MED ORDER — SODIUM CHLORIDE 0.9 % IV SOLN
Freq: Once | INTRAVENOUS | Status: AC
  Administered 2013-05-10: 15:00:00 via INTRAVENOUS

## 2013-05-10 MED ORDER — HEPARIN SOD (PORK) LOCK FLUSH 100 UNIT/ML IV SOLN
500.0000 [IU] | Freq: Once | INTRAVENOUS | Status: AC | PRN
  Administered 2013-05-10: 500 [IU]
  Filled 2013-05-10: qty 5

## 2013-05-10 MED ORDER — ONDANSETRON 8 MG/NS 50 ML IVPB
INTRAVENOUS | Status: AC
  Filled 2013-05-10: qty 8

## 2013-05-10 NOTE — Telephone Encounter (Signed)
Per staff message and POF I have scheduled appts.  [name]  

## 2013-05-10 NOTE — Patient Instructions (Addendum)
[facility] Discharge Instructions for Patients Receiving Chemotherapy  Today you received the following chemotherapy agents: Taxol, Herceptin   To help prevent nausea and vomiting after your treatment, we encourage you to take your nausea medication as directed.   If you develop nausea and vomiting that is not controlled by your nausea medication, call the clinic.   BELOW ARE SYMPTOMS THAT SHOULD BE REPORTED IMMEDIATELY:  *FEVER GREATER THAN 100.5 F  *CHILLS WITH OR WITHOUT FEVER  NAUSEA AND VOMITING THAT IS NOT CONTROLLED WITH YOUR NAUSEA MEDICATION  *UNUSUAL SHORTNESS OF BREATH  *UNUSUAL BRUISING OR BLEEDING  TENDERNESS IN MOUTH AND THROAT WITH OR WITHOUT PRESENCE OF ULCERS  *URINARY PROBLEMS  *BOWEL PROBLEMS  UNUSUAL RASH Items with * indicate a potential emergency and should be followed up as soon as possible.  Feel free to call the clinic you have any questions or concerns. The clinic phone number is [phone].

## 2013-05-10 NOTE — Patient Instructions (Signed)
Proceed with taxol/herceptin cycle 1 today  We will see you back on 3/16 for cycle 2

## 2013-05-10 NOTE — Progress Notes (Signed)
OFFICE PROGRESS NOTE  CC  [name], MD [address] [place] [address] Dr. Thea Silversmith  Dr. Rolm Bookbinder  DIAGNOSIS: 80 year old now with new diagnosis of T1 N1 right breast cancer  Breast cancer of upper-outer quadrant of right female breast   Primary site: Breast (Right)   Staging method: AJCC 7th Edition   Clinical: Stage IA (T1c, N0, cM0)   Summary: Stage IA (T1c, N0, cM0)  PRIOR THERAPY: #1screening mammogram patient was found to have a right breast mass measuring 1 cm. There were no abnormalities on her physical exam. Patient had ultrasound performed that showed 1.3 x 1.2 x 1.0 cm hypoechoic mass in the upper outer quadrant of the right breast. In the right axilla there was an abnormal appearing lymph node measuring 1.65 1.4 x 0.8 cm. Both her biopsy. The lymph node was positive for metastatic carcinoma. The primary breast tumor biopsy showed invasive ductal carcinoma, grade 2, ER positive PR positive HER-2/neu equivocal with a proliferation marker Ki-67 15%  #2 status post right lumpectomy with the final pathology revealing 1.4 cm invasive ductal carcinoma grade 3 ER positive PR positive repeat HER-2/neu testing revealed the HER-2 receptor to be positive. She did have history of margin that was positive. She is status post reexcision with Port-A-Cath placement by Dr. Rolm Bookbinder.  #3 adjuvant curative intent chemotherapy: Consisting of Taxol/Herceptin beginning 05/10/2013. She will receive treatment day 1, day 8, day 15, day 22 on a 28 day cycle for a total of 4 cycles. Patient understands risks benefits and side effects. She had an echocardiogram performed on 04/22/2013 with EF of 65-70%   CURRENT THERAPY: Cycle 1 day 1 Taxol/Herceptin  INTERVAL HISTORY: Sabrina [name] 80 y.o. female returns for followup visit. Patient is accompanied by her husband. Overall she looks terrific. She has had reexcision of the positive margin as well as a  Port-A-Cath placed. She is upset as far as chemotherapy teaching class is concerned and visit with cardio oncology. Her echocardiogram looks normal. She today he is a little bit apprehensive as far as starting chemotherapy is concerned. She has all of her antiemetics. She is counseled on how to take them.Today she denies any headaches double vision blurring of vision fevers chills night sweats. No shortness of breath chest pains palpitations. No abdominal pain no diarrhea or constipation. She has no easy bruising or bleeding. She has no myalgias and arthralgias. No peripheral paresthesias or gait disturbances. Remainder of the 10 point review of systems is negative.  MEDICAL HISTORY: Past Medical History  Diagnosis Date  . Breast cancer 03/2013    right  . Heart murmur     no known problems; states did not know she had murmur until age 62  . Immature cataract   . Arthritis     neck  . Wears partial dentures     lower  . Dental crowns present   . Hypertension     fluctuates, especially when stressed; has been on med. > 20 yr.  . Non-insulin dependent type 2 diabetes mellitus   . High cholesterol   . Chronic kidney disease (CKD), stage III (moderate)     nephrologist, Dr. [name]    ALLERGIES:  has No Known Allergies.  MEDICATIONS:  Current Outpatient Prescriptions  Medication Sig Dispense Refill  . atorvastatin (LIPITOR) 10 MG tablet Take 10 mg by mouth daily.      . Biotin 1000 MCG tablet Take 1,000 mcg by mouth daily.      [name]  Calcium Citrate (CITRACAL PO) Take 1 tablet by mouth daily.       . Cholecalciferol (VITAMIN D-3) 1000 UNITS CAPS Take 1,000 Units by mouth daily.      . cloNIDine (CATAPRES) 0.2 MG tablet Take 0.2 mg by mouth 2 (two) times daily.      [name] dexamethasone (DECADRON) 4 MG tablet Take 2 tablets (8 mg total) by mouth 2 (two) times daily with a meal. Start the day after chemotherapy for 2 days. Take with food.  30 tablet  1  . furosemide (LASIX) 40 MG tablet  Take 40 mg by mouth daily.      [name] lidocaine-prilocaine (EMLA) cream Apply 1 - 2 hours to port -a-cath before being accessed.  30 g  6  . LORazepam (ATIVAN) 0.5 MG tablet Take 1 tablet (0.5 mg total) by mouth every 6 (six) hours as needed (Nausea or vomiting).  30 tablet  0  . metFORMIN (GLUCOPHAGE) 850 MG tablet Take 850 mg by mouth 2 (two) times daily with a meal.      . Multiple Vitamins-Minerals (MULTIVITAMIN PO) Take 1 tablet by mouth daily.       . ondansetron (ZOFRAN) 8 MG tablet Take 1 tablet (8 mg total) by mouth 2 (two) times daily. Start the day after chemo for 2 days. Then take as needed for nausea or vomiting.  30 tablet  1  . potassium chloride 20 MEQ TBCR Take 20 mEq by mouth daily.  30 tablet  3  . prochlorperazine (COMPAZINE) 10 MG tablet Take 1 tablet (10 mg total) by mouth every 6 (six) hours as needed (Nausea or vomiting).  30 tablet  1  . traMADol (ULTRAM) 50 MG tablet Take 50 mg by mouth 2 (two) times daily.      . valACYclovir (VALTREX) 500 MG tablet Take 1 tablet (500 mg total) by mouth daily. For 7 days  7 tablet  3  . verapamil (VERELAN PM) 240 MG 24 hr capsule Take 240 mg by mouth daily.       [name] HYDROcodone-acetaminophen (NORCO/VICODIN) 5-325 MG per tablet Take 1-2 tablets by mouth every 4 (four) hours as needed for moderate pain.  20 tablet  0  . IRON PO Take 65 mg by mouth daily.       No current facility-administered medications for this visit.    SURGICAL HISTORY:  Past Surgical History  Procedure Laterality Date  . Tonsillectomy      as a child  . Breast surgery Right 11/1958    right breast biopsy benign  . Dilation and curettage of uterus    . Breast lumpectomy with needle localization Right 03/30/2013    Procedure: BREAST LUMPECTOMY WITH NEEDLE LOCALIZATION;  Surgeon: Rolm Bookbinder, MD;  Location: [facility];  Service: General;  Laterality: Right;  . Axillary lymph node dissection Right 03/30/2013    Procedure: AXILLARY LYMPH NODE DISSECTION;  Surgeon:  Rolm Bookbinder, MD;  Location: [facility];  Service: General;  Laterality: Right;  . Re-excision of breast cancer,superior margins Right 04/15/2013    Procedure: RE-EXCISION OF RIGHT BREAST  MARGINS;  Surgeon: Rolm Bookbinder, MD;  Location: [facility];  Service: General;  Laterality: Right;  . Portacath placement N/A 04/15/2013    Procedure: INSERTION PORT-A-CATH;  Surgeon: Rolm Bookbinder, MD;  Location: [facility];  Service: General;  Laterality: N/A;    REVIEW OF SYSTEMS:  Pertinent items are noted in HPI.     PHYSICAL EXAMINATION: Blood pressure 185/75, pulse  105, temperature 98 F (36.7 C), temperature source Oral, resp. rate 20, height 5' 4"  (1.626 m), weight 185 lb 3.2 oz (84.006 kg). Body mass index is 31.77 kg/(m^2). ECOG PERFORMANCE STATUS: 1 - Symptomatic but completely ambulatory   General appearance: alert, cooperative and appears stated age Resp: clear to auscultation bilaterally Back: symmetric, no curvature. ROM normal. No CVA tenderness. Cardio: regular rate and rhythm GI: soft, non-tender; bowel sounds normal; no masses,  no organomegaly Extremities: edema +1 Right breast: Excision is healing well as is the axillary incision. No evidence of infections or bleeding  LABORATORY DATA: Lab Results  Component Value Date   WBC 6.9 05/10/2013   HGB 10.9* 05/10/2013   HCT 33.0* 05/10/2013   MCV 90.7 05/10/2013   PLT 264 05/10/2013      Chemistry      Component Value Date/Time   NA 141 03/24/2013 1248   K 4.2 03/24/2013 1248   CO2 29 03/24/2013 1248   BUN 36.6* 03/24/2013 1248   CREATININE 1.7* 03/24/2013 1248      Component Value Date/Time   CALCIUM 9.7 03/24/2013 1248   ALKPHOS 114 03/24/2013 1248   AST 13 03/24/2013 1248   ALT 18 03/24/2013 1248   BILITOT 0.85 03/24/2013 1248       RADIOGRAPHIC STUDIES:  Dg Chest 2 View  03/30/2013   CLINICAL DATA:  PREOP EVALUATION, RIGHT BREAST SURGERY  EXAM: CHEST  2 VIEW  COMPARISON:  DG CHEST 2V dated  02/19/2012  FINDINGS: The heart size and mediastinal contours are within normal limits. Both lungs are clear. The visualized skeletal structures are unremarkable.  IMPRESSION: No active cardiopulmonary disease.   Electronically Signed   By: Margaree Mackintosh M.D.   On: 03/30/2013 15:02   Mm Digital Diagnostic Unilat R  03/17/2013   CLINICAL DATA:  Suspicious right breast mass.  EXAM: POST-BIOPSY CLIP PLACEMENT RIGHT DIAGNOSTIC MAMMOGRAM  COMPARISON:  Previous exams.  FINDINGS: Films are performed following ultrasound guided biopsy of suspicious right breast mass. Wing shaped biopsy marking clip in appropriate position.  IMPRESSION: Appropriate position biopsy marking clip status post ultrasound-guided core needle biopsy of suspicious right breast mass.  Final Assessment: Post Procedure Mammograms for Marker Placement   Electronically Signed   By: Lovey Newcomer M.D.   On: 03/17/2013 11:21   Mm Radiologist Eval And Mgmt  03/18/2013   EXAM: ESTABLISHED PATIENT OFFICE VISIT - LEVEL II ([id number])  EXAM: Ecchymosis of the lateral right breast at the site of biopsy as well as at the right axilla is noted. Clean bandages are in place.  HISTORY OF PRESENT ILLNESS: Patient presents following an ultrasound-guided biopsy of a right breast mass and right axillary lymph node. She has mild soreness in the right axilla and in the right breast at site of biopsy.  CHIEF COMPLAINT: Status post ultrasound-guided biopsy of a right breast mass and right axillary lymph node.  PATHOLOGY: Pathology of the tissue samples from 9 o'clock in the right breast demonstrates invasive mammary carcinoma. Pathology of the right axillary lymph node demonstrates metastatic carcinoma.  ASSESSMENT AND PLAN: ASSESSMENT AND PLAN Pathology is concordant with imaging findings. Surgical consultation is recommended. She is scheduled for the multidisciplinary clinic on 03/24/2013.   Electronically Signed   By: Donavan Burnet M.D.   On: 03/18/2013 13:33   [place]  Rt Breast Bx W Loc Dev 1st Lesion Img Bx Spec US Guide  03/17/2013   CLINICAL DATA:  Patient with suspicious right breast mass and enlarged right  axillary lymph node.  EXAM: ULTRASOUND GUIDED RIGHT BREAST CORE NEEDLE BIOPSY WITH VACUUM ASSIST  COMPARISON:  Previous exams.  PROCEDURE: I met with the patient and we discussed the procedure of ultrasound-guided biopsy, including benefits and alternatives. We discussed the high likelihood of a successful procedure. We discussed the risks of the procedure including infection, bleeding, tissue injury, clip migration, and inadequate sampling. Informed written consent was given. The usual time-out protocol was performed immediately prior to the procedure.  Using sterile technique and 2% Lidocaine as local anesthetic, under direct ultrasound visualization, a 12 gauge vacuum-assisteddevice was used to perform biopsy of suspicious right breast mass using a lateral approach. At the conclusion of the procedure, a wing shaped tissue marker clip was deployed into the biopsy cavity. Follow-up 2-view mammogram was performed and dictated separately.  Using sterile technique and 2% Lidocaine as local anesthetic, under direct ultrasound visualization, a 12 gauge vacuum-assisteddevice was used to perform biopsy of enlarged right axillary lymph node using a lateral approach.  IMPRESSION: Ultrasound-guided biopsy of suspicious right breast mass and enlarged right axillary lymph node. No apparent complications.   Electronically Signed   By: Lovey Newcomer M.D.   On: 03/17/2013 11:20   [place] Rt Breast Bx W Loc Dev Ea Add Lesion Img Bx Spec US Guide  03/17/2013   CLINICAL DATA:  Patient with suspicious right breast mass and enlarged right axillary lymph node.  EXAM: ULTRASOUND GUIDED RIGHT BREAST CORE NEEDLE BIOPSY WITH VACUUM ASSIST  COMPARISON:  Previous exams.  PROCEDURE: I met with the patient and we discussed the procedure of ultrasound-guided biopsy, including benefits and alternatives.  We discussed the high likelihood of a successful procedure. We discussed the risks of the procedure including infection, bleeding, tissue injury, clip migration, and inadequate sampling. Informed written consent was given. The usual time-out protocol was performed immediately prior to the procedure.  Using sterile technique and 2% Lidocaine as local anesthetic, under direct ultrasound visualization, a 12 gauge vacuum-assisteddevice was used to perform biopsy of suspicious right breast mass using a lateral approach. At the conclusion of the procedure, a wing shaped tissue marker clip was deployed into the biopsy cavity. Follow-up 2-view mammogram was performed and dictated separately.  Using sterile technique and 2% Lidocaine as local anesthetic, under direct ultrasound visualization, a 12 gauge vacuum-assisteddevice was used to perform biopsy of enlarged right axillary lymph node using a lateral approach.  IMPRESSION: Ultrasound-guided biopsy of suspicious right breast mass and enlarged right axillary lymph node. No apparent complications.   Electronically Signed   By: Lovey Newcomer M.D.   On: 03/17/2013 11:20   Mm Rt Plc Breast Loc Dev   1st Lesion  Inc Mammo Guide  03/30/2013   CLINICAL DATA:  Patient with right breast carcinoma and positive right axillary lymph node.  EXAM: ULTRASOUND NEEDLE LOCALIZATION OF THE RIGHT BREAST AND RIGHT AXILLA  COMPARISON:  Previous exams.  FINDINGS: Patient presents for needle localization prior to lumpectomy and right axillary lymph node excision. I met with the patient and we discussed the procedure of needle localization including benefits and alternatives. We discussed the high likelihood of a successful procedure. We discussed the risks of the procedure, including infection, bleeding, tissue injury, and further surgery. Informed, written consent was given.  The usual time-out protocol was performed immediately prior to the procedure.  Using ultrasound guidance, sterile  technique, 2% lidocaine and a 7 cm modified Kopans needle was used to localize the abnormal right axillary lymph node and a 5  cm modified Kopan's needle was used to localize the right breast mass at 9 o'clock. A lateral to medial approach was used for both the node and mass. The films are marked for Dr. Donne Hazel.  Specimen radiograph is performed and confirms the clip and hookwire to be present in the tissue sample. The specimen is marked for pathology. The localized axillary lymph node specimen was not radiographed.  IMPRESSION: Needle localization of the right breast and an abnormal right axillary lymph node. No apparent complications.   Electronically Signed   By: Donavan Burnet M.D.   On: 03/30/2013 16:48   [place] Rt Plc Breast Loc Dev   1st Lesion  Inc US Guide  03/30/2013   CLINICAL DATA:  Patient with right breast carcinoma and positive right axillary lymph node.  EXAM: ULTRASOUND NEEDLE LOCALIZATION OF THE RIGHT BREAST AND RIGHT AXILLA  COMPARISON:  Previous exams.  FINDINGS: Patient presents for needle localization prior to lumpectomy and right axillary lymph node excision. I met with the patient and we discussed the procedure of needle localization including benefits and alternatives. We discussed the high likelihood of a successful procedure. We discussed the risks of the procedure, including infection, bleeding, tissue injury, and further surgery. Informed, written consent was given.  The usual time-out protocol was performed immediately prior to the procedure.  Using ultrasound guidance, sterile technique, 2% lidocaine and a 7 cm modified Kopans needle was used to localize the abnormal right axillary lymph node and a 5 cm modified Kopan's needle was used to localize the right breast mass at 9 o'clock. A lateral to medial approach was used for both the node and mass. The films are marked for Dr. Donne Hazel.  Specimen radiograph is performed and confirms the clip and hookwire to be present in the  tissue sample. The specimen is marked for pathology. The localized axillary lymph node specimen was not radiographed.  IMPRESSION: Needle localization of the right breast and an abnormal right axillary lymph node. No apparent complications.   Electronically Signed   By: Donavan Burnet M.D.   On: 03/30/2013 16:48   [place] Rt Plc Breast Loc Dev   1st Lesion  Inc US Guide  03/30/2013   CLINICAL DATA:  Patient with right breast carcinoma and positive right axillary lymph node.  EXAM: ULTRASOUND NEEDLE LOCALIZATION OF THE RIGHT BREAST AND RIGHT AXILLA  COMPARISON:  Previous exams.  FINDINGS: Patient presents for needle localization prior to lumpectomy and right axillary lymph node excision. I met with the patient and we discussed the procedure of needle localization including benefits and alternatives. We discussed the high likelihood of a successful procedure. We discussed the risks of the procedure, including infection, bleeding, tissue injury, and further surgery. Informed, written consent was given.  The usual time-out protocol was performed immediately prior to the procedure.  Using ultrasound guidance, sterile technique, 2% lidocaine and a 7 cm modified Kopans needle was used to localize the abnormal right axillary lymph node and a 5 cm modified Kopan's needle was used to localize the right breast mass at 9 o'clock. A lateral to medial approach was used for both the node and mass. The films are marked for Dr. Donne Hazel.  Specimen radiograph is performed and confirms the clip and hookwire to be present in the tissue sample. The specimen is marked for pathology. The localized axillary lymph node specimen was not radiographed.  IMPRESSION: Needle localization of the right breast and an abnormal right axillary lymph node. No apparent complications.   Electronically  Signed   By: Donavan Burnet M.D.   On: 03/30/2013 16:48    ASSESSMENT/PLAN: 80 year old female with  #1 stage II (T1 N1) invasive ductal  carcinoma, grade 3 ER PR positive HER-2/neu positive by repeat HER-2 receptor on final pathology of the right breast. S/P lumpectomy with SLN  #2 Adjuvant Chemotherapy: patient to begin taxol/herceptin days 1, 8, 15, 22 on a 28 day cycle. Today she will have cycle 1 day 1. We discussed risks and benefits of chemotherapy and herceptin. She had an echocardiogram and it showed a normal EF. She has also been seen by Dr. [name].  #3 Blood work: I have checked patients CBC and it is adequate to proceed with her chemotherapy, CMET is pending, her prior CMET was normal  #4 Follow up: She'll be seen back in one week for cycle 1 day 8 of Taxol/Herceptin treatment.  #5 patient knows to call me with any problems questions or concerns.  All questions were answered. The patient knows to call the clinic with any problems, questions or concerns. We can certainly see the patient much sooner if necessary.  I spent 25 minutes counseling the patient face to face. The total time spent in the appointment was 30 minutes.    Marcy Panning, MD Medical/Oncology [facility] [phone] (beeper) [phone] (Office)  05/10/2013, 1:05 PM

## 2013-05-12 ENCOUNTER — Other Ambulatory Visit: Payer: Self-pay

## 2013-05-12 ENCOUNTER — Telehealth: Payer: Self-pay | Admitting: *Deleted

## 2013-05-12 NOTE — Telephone Encounter (Addendum)
Spoke with pt today for post chemo follow up call.  Pt stated she was doing fine.  Denied nausea/vomiting; good appetite and drinking lots of fluids as tolerated.  Bowel and bladder function fine.  Denied pain.  Pt aware of next return appts. Pt stated she had good experience with her first chemo.  Staff were courteous, and nice.  Pt was provided with explanations during treatment.

## 2013-05-17 ENCOUNTER — Encounter: Payer: Self-pay | Admitting: Oncology

## 2013-05-17 ENCOUNTER — Ambulatory Visit (HOSPITAL_BASED_OUTPATIENT_CLINIC_OR_DEPARTMENT_OTHER): Payer: Medicare Other | Admitting: Oncology

## 2013-05-17 ENCOUNTER — Other Ambulatory Visit (HOSPITAL_BASED_OUTPATIENT_CLINIC_OR_DEPARTMENT_OTHER): Payer: Medicare Other

## 2013-05-17 ENCOUNTER — Ambulatory Visit (HOSPITAL_BASED_OUTPATIENT_CLINIC_OR_DEPARTMENT_OTHER): Payer: Medicare Other

## 2013-05-17 ENCOUNTER — Telehealth: Payer: Self-pay

## 2013-05-17 VITALS — BP 145/73 | HR 96 | Temp 98.0°F | Resp 18 | Ht 64.0 in | Wt 180.9 lb

## 2013-05-17 DIAGNOSIS — C773 Secondary and unspecified malignant neoplasm of axilla and upper limb lymph nodes: Secondary | ICD-10-CM

## 2013-05-17 DIAGNOSIS — R634 Abnormal weight loss: Secondary | ICD-10-CM

## 2013-05-17 DIAGNOSIS — Z5111 Encounter for antineoplastic chemotherapy: Secondary | ICD-10-CM

## 2013-05-17 DIAGNOSIS — C50919 Malignant neoplasm of unspecified site of unspecified female breast: Secondary | ICD-10-CM

## 2013-05-17 DIAGNOSIS — C50411 Malignant neoplasm of upper-outer quadrant of right female breast: Secondary | ICD-10-CM

## 2013-05-17 DIAGNOSIS — E860 Dehydration: Secondary | ICD-10-CM

## 2013-05-17 DIAGNOSIS — F411 Generalized anxiety disorder: Secondary | ICD-10-CM

## 2013-05-17 DIAGNOSIS — Z5112 Encounter for antineoplastic immunotherapy: Secondary | ICD-10-CM

## 2013-05-17 LAB — COMPREHENSIVE METABOLIC PANEL (CC13)
ALT: 27 U/L (ref 0–55)
AST: 13 U/L (ref 5–34)
Albumin: 3.5 g/dL (ref 3.5–5.0)
Alkaline Phosphatase: 86 U/L (ref 40–150)
Anion Gap: 11 mEq/L (ref 3–11)
BUN: 49 mg/dL — ABNORMAL HIGH (ref 7.0–26.0)
CO2: 25 mEq/L (ref 22–29)
Calcium: 9.2 mg/dL (ref 8.4–10.4)
Chloride: 103 mEq/L (ref 98–109)
Creatinine: 1.8 mg/dL — ABNORMAL HIGH (ref 0.6–1.1)
Glucose: 283 mg/dl — ABNORMAL HIGH (ref 70–140)
Potassium: 4.2 mEq/L (ref 3.5–5.1)
Sodium: 140 mEq/L (ref 136–145)
Total Bilirubin: 0.65 mg/dL (ref 0.20–1.20)
Total Protein: 6.5 g/dL (ref 6.4–8.3)

## 2013-05-17 LAB — CBC WITH DIFFERENTIAL/PLATELET
BASO%: 0.1 % (ref 0.0–2.0)
Basophils Absolute: 0 10*3/uL (ref 0.0–0.1)
EOS%: 7.3 % — ABNORMAL HIGH (ref 0.0–7.0)
Eosinophils Absolute: 0.5 10*3/uL (ref 0.0–0.5)
HCT: 31.3 % — ABNORMAL LOW (ref 34.8–46.6)
HGB: 10.2 g/dL — ABNORMAL LOW (ref 11.6–15.9)
LYMPH%: 29.6 % (ref 14.0–49.7)
MCH: 29.8 pg (ref 25.1–34.0)
MCHC: 32.6 g/dL (ref 31.5–36.0)
MCV: 91.5 fL (ref 79.5–101.0)
MONO#: 0.2 10*3/uL (ref 0.1–0.9)
MONO%: 2.8 % (ref 0.0–14.0)
NEUT#: 4 10*3/uL (ref 1.5–6.5)
NEUT%: 60.2 % (ref 38.4–76.8)
Platelets: 209 10*3/uL (ref 145–400)
RBC: 3.42 10*6/uL — ABNORMAL LOW (ref 3.70–5.45)
RDW: 13.6 % (ref 11.2–14.5)
WBC: 6.7 10*3/uL (ref 3.9–10.3)
lymph#: 2 10*3/uL (ref 0.9–3.3)
nRBC: 0 % (ref 0–0)

## 2013-05-17 MED ORDER — DEXAMETHASONE SODIUM PHOSPHATE 20 MG/5ML IJ SOLN
INTRAMUSCULAR | Status: AC
  Filled 2013-05-17: qty 5

## 2013-05-17 MED ORDER — SODIUM CHLORIDE 0.9 % IJ SOLN
10.0000 mL | INTRAMUSCULAR | Status: DC | PRN
  Administered 2013-05-17: 10 mL
  Filled 2013-05-17: qty 10

## 2013-05-17 MED ORDER — TRASTUZUMAB CHEMO INJECTION 440 MG
2.0000 mg/kg | Freq: Once | INTRAVENOUS | Status: AC
  Administered 2013-05-17: 168 mg via INTRAVENOUS
  Filled 2013-05-17: qty 8

## 2013-05-17 MED ORDER — ONDANSETRON 8 MG/50ML IVPB (CHCC)
8.0000 mg | Freq: Once | INTRAVENOUS | Status: AC
  Administered 2013-05-17: 8 mg via INTRAVENOUS

## 2013-05-17 MED ORDER — SODIUM CHLORIDE 0.9 % IV SOLN
Freq: Once | INTRAVENOUS | Status: AC
  Administered 2013-05-17: 10:00:00 via INTRAVENOUS

## 2013-05-17 MED ORDER — DIPHENHYDRAMINE HCL 25 MG PO CAPS
ORAL_CAPSULE | ORAL | Status: AC
  Filled 2013-05-17: qty 2

## 2013-05-17 MED ORDER — HEPARIN SOD (PORK) LOCK FLUSH 100 UNIT/ML IV SOLN
500.0000 [IU] | Freq: Once | INTRAVENOUS | Status: AC | PRN
  Administered 2013-05-17: 500 [IU]
  Filled 2013-05-17: qty 5

## 2013-05-17 MED ORDER — ONDANSETRON 8 MG/NS 50 ML IVPB
INTRAVENOUS | Status: AC
  Filled 2013-05-17: qty 8

## 2013-05-17 MED ORDER — SODIUM CHLORIDE 0.9 % IV SOLN
80.0000 mg/m2 | Freq: Once | INTRAVENOUS | Status: AC
  Administered 2013-05-17: 156 mg via INTRAVENOUS
  Filled 2013-05-17: qty 26

## 2013-05-17 MED ORDER — ACETAMINOPHEN 325 MG PO TABS
650.0000 mg | ORAL_TABLET | Freq: Once | ORAL | Status: AC
  Administered 2013-05-17: 650 mg via ORAL

## 2013-05-17 MED ORDER — FAMOTIDINE IN NACL 20-0.9 MG/50ML-% IV SOLN
INTRAVENOUS | Status: AC
  Filled 2013-05-17: qty 50

## 2013-05-17 MED ORDER — FAMOTIDINE IN NACL 20-0.9 MG/50ML-% IV SOLN
20.0000 mg | Freq: Once | INTRAVENOUS | Status: AC
  Administered 2013-05-17: 20 mg via INTRAVENOUS

## 2013-05-17 MED ORDER — ACETAMINOPHEN 325 MG PO TABS
ORAL_TABLET | ORAL | Status: AC
  Filled 2013-05-17: qty 2

## 2013-05-17 MED ORDER — DIPHENHYDRAMINE HCL 50 MG/ML IJ SOLN
50.0000 mg | Freq: Once | INTRAMUSCULAR | Status: AC
  Administered 2013-05-17: 50 mg via INTRAVENOUS

## 2013-05-17 MED ORDER — DEXAMETHASONE SODIUM PHOSPHATE 20 MG/5ML IJ SOLN
20.0000 mg | Freq: Once | INTRAMUSCULAR | Status: AC
  Administered 2013-05-17: 20 mg via INTRAVENOUS

## 2013-05-17 MED ORDER — DIPHENHYDRAMINE HCL 50 MG/ML IJ SOLN
INTRAMUSCULAR | Status: AC
  Filled 2013-05-17: qty 1

## 2013-05-17 NOTE — Telephone Encounter (Signed)
Per [facility] notes below - pt informed that [place] wants her to push fluids due to lab results.  Pt voiced understanding.     Notes Recorded by Deatra Robinson, MD on 05/17/2013 at 3:10 PM Let patient know that she needs to push fluids. She looks dehydrated by labs

## 2013-05-17 NOTE — Patient Instructions (Signed)
Proceed with scheduled chemotherapy today  Take lorazepam for anxiety  We will set you up to see a nutritionist to help with eating

## 2013-05-17 NOTE — Patient Instructions (Signed)
[facility] Discharge Instructions for Patients Receiving Chemotherapy  Today you received the following chemotherapy agents taxol/herceptin.   To help prevent nausea and vomiting after your treatment, we encourage you to take your nausea medication as directed.  If you develop nausea and vomiting that is not controlled by your nausea medication, call the clinic.   BELOW ARE SYMPTOMS THAT SHOULD BE REPORTED IMMEDIATELY:  *FEVER GREATER THAN 100.5 F  *CHILLS WITH OR WITHOUT FEVER  NAUSEA AND VOMITING THAT IS NOT CONTROLLED WITH YOUR NAUSEA MEDICATION  *UNUSUAL SHORTNESS OF BREATH  *UNUSUAL BRUISING OR BLEEDING  TENDERNESS IN MOUTH AND THROAT WITH OR WITHOUT PRESENCE OF ULCERS  *URINARY PROBLEMS  *BOWEL PROBLEMS  UNUSUAL RASH Items with * indicate a potential emergency and should be followed up as soon as possible.  Feel free to call the clinic you have any questions or concerns. The clinic phone number is [phone].

## 2013-05-17 NOTE — Progress Notes (Signed)
OFFICE PROGRESS NOTE  CC  [name], MD [address] [place] [address] Dr. Thea Silversmith  Dr. Rolm Bookbinder  DIAGNOSIS: 80 year old now with new diagnosis of T1 N1 right breast cancer  Breast cancer of upper-outer quadrant of right female breast   Primary site: Breast (Right)   Staging method: AJCC 7th Edition   Clinical: Stage IA (T1c, N0, cM0)   Summary: Stage IA (T1c, N0, cM0)  PRIOR THERAPY: #1screening mammogram patient was found to have a right breast mass measuring 1 cm. There were no abnormalities on her physical exam. Patient had ultrasound performed that showed 1.3 x 1.2 x 1.0 cm hypoechoic mass in the upper outer quadrant of the right breast. In the right axilla there was an abnormal appearing lymph node measuring 1.65 1.4 x 0.8 cm. Both her biopsy. The lymph node was positive for metastatic carcinoma. The primary breast tumor biopsy showed invasive ductal carcinoma, grade 2, ER positive PR positive HER-2/neu equivocal with a proliferation marker Ki-67 15%  #2 status post right lumpectomy with the final pathology revealing 1.4 cm invasive ductal carcinoma grade 3 ER positive PR positive repeat HER-2/neu testing revealed the HER-2 receptor to be positive. She did have history of margin that was positive. She is status post reexcision with Port-A-Cath placement by Dr. Rolm Bookbinder.  #3 adjuvant curative intent chemotherapy: Consisting of Taxol/Herceptin beginning 05/10/2013. She will receive treatment day 1, day 8, day 15, day 22 on a 28 day cycle for a total of 4 cycles. Patient understands risks benefits and side effects. She had an echocardiogram performed on 04/22/2013 with EF of 65-70%   CURRENT THERAPY: Cycle 1 day 8 Taxol/Herceptin 05/17/2013  INTERVAL HISTORY: Sabrina [name] 80 y.o. female returns for followup visit. Overall patient is doing well. She is here for day 8 of Taxol and Herceptin being given adjuvantly. She tolerated  day 1 quite well last week. She had a few issues going on today. She is very anxiety driven cries easily. We discussed using lorazepam as needed. She also had an episode of dizziness. We discussed pushing your fluids as much as she possibly can. She has not been eating as well. We discussed different foods that she can try possibly. We also spoke about getting nutrition involved for her. She's not had any fevers chills night sweats no headaches shortness of breath or chest pains. Patient does have a chronic cough which is nonproductive. It happens primarily at home. Remainder of the 10 point review of systems is negative. MEDICAL HISTORY: Past Medical History  Diagnosis Date  . Breast cancer 03/2013    right  . Heart murmur     no known problems; states did not know she had murmur until age 68  . Immature cataract   . Arthritis     neck  . Wears partial dentures     lower  . Dental crowns present   . Hypertension     fluctuates, especially when stressed; has been on med. > 20 yr.  . Non-insulin dependent type 2 diabetes mellitus   . High cholesterol   . Chronic kidney disease (CKD), stage III (moderate)     nephrologist, Dr. [name]    ALLERGIES:  has No Known Allergies.  MEDICATIONS:  Current Outpatient Prescriptions  Medication Sig Dispense Refill  . atorvastatin (LIPITOR) 10 MG tablet Take 10 mg by mouth daily.      . Biotin 1000 MCG tablet Take 1,000 mcg by mouth daily.      [name]  Calcium Citrate (CITRACAL PO) Take 1 tablet by mouth daily.       . Cholecalciferol (VITAMIN D-3) 1000 UNITS CAPS Take 1,000 Units by mouth daily.      . cloNIDine (CATAPRES) 0.2 MG tablet Take 0.2 mg by mouth 2 (two) times daily.      [name] dexamethasone (DECADRON) 4 MG tablet       . furosemide (LASIX) 40 MG tablet Take 40 mg by mouth daily.      . IRON PO Take 65 mg by mouth daily.      [name] lidocaine-prilocaine (EMLA) cream Apply 1 - 2 hours to port -a-cath before being accessed.  30 g  6  .  LORazepam (ATIVAN) 0.5 MG tablet Take 1 tablet (0.5 mg total) by mouth every 6 (six) hours as needed (Nausea or vomiting).  30 tablet  0  . metFORMIN (GLUCOPHAGE) 850 MG tablet Take 850 mg by mouth 2 (two) times daily with a meal.      . Multiple Vitamins-Minerals (MULTIVITAMIN PO) Take 1 tablet by mouth daily.       . ondansetron (ZOFRAN) 8 MG tablet       . potassium chloride 20 MEQ TBCR Take 20 mEq by mouth daily.  30 tablet  3  . prochlorperazine (COMPAZINE) 10 MG tablet       . traMADol (ULTRAM) 50 MG tablet Take 50 mg by mouth 2 (two) times daily.      . verapamil (VERELAN PM) 240 MG 24 hr capsule Take 240 mg by mouth daily.       [name] HYDROcodone-acetaminophen (NORCO/VICODIN) 5-325 MG per tablet Take 1-2 tablets by mouth every 4 (four) hours as needed for moderate pain.  20 tablet  0  . valACYclovir (VALTREX) 500 MG tablet Take 1 tablet (500 mg total) by mouth daily. For 7 days  7 tablet  3   No current facility-administered medications for this visit.    SURGICAL HISTORY:  Past Surgical History  Procedure Laterality Date  . Tonsillectomy      as a child  . Breast surgery Right 11/1958    right breast biopsy benign  . Dilation and curettage of uterus    . Breast lumpectomy with needle localization Right 03/30/2013    Procedure: BREAST LUMPECTOMY WITH NEEDLE LOCALIZATION;  Surgeon: Rolm Bookbinder, MD;  Location: [place];  Service: General;  Laterality: Right;  . Axillary lymph node dissection Right 03/30/2013    Procedure: AXILLARY LYMPH NODE DISSECTION;  Surgeon: Rolm Bookbinder, MD;  Location: [place];  Service: General;  Laterality: Right;  . Re-excision of breast cancer,superior margins Right 04/15/2013    Procedure: RE-EXCISION OF RIGHT BREAST  MARGINS;  Surgeon: Rolm Bookbinder, MD;  Location: [facility];  Service: General;  Laterality: Right;  . Portacath placement N/A 04/15/2013    Procedure: INSERTION PORT-A-CATH;  Surgeon: Rolm Bookbinder, MD;  Location: [place];  Service: General;  Laterality: N/A;    REVIEW OF SYSTEMS:  Pertinent items are noted in HPI.     PHYSICAL EXAMINATION: Blood pressure 145/73, pulse 96, temperature 98 F (36.7 C), temperature source Oral, resp. rate 18, height _0  (1.626 m), weight 180 lb 14.4 oz (82.056 kg). Body mass index is 31.04 kg/(m^2). ECOG PERFORMANCE STATUS: 1 - Symptomatic but completely ambulatory   General appearance: alert, cooperative and appears stated age Resp: clear to auscultation bilaterally Back: symmetric, no curvature. ROM normal. No CVA tenderness. Cardio: regular rate and rhythm GI: soft, non-tender;  bowel sounds normal; no masses,  no organomegaly Extremities: edema +1 Right breast: Excision is healing well as is the axillary incision. No evidence of infections or bleeding  LABORATORY DATA: Lab Results  Component Value Date   WBC 6.7 05/17/2013   HGB 10.2* 05/17/2013   HCT 31.3* 05/17/2013   MCV 91.5 05/17/2013   PLT 209 05/17/2013      Chemistry      Component Value Date/Time   NA 139 05/10/2013 1218   K 4.2 05/10/2013 1218   CO2 28 05/10/2013 1218   BUN 44.5* 05/10/2013 1218   CREATININE 1.8* 05/10/2013 1218      Component Value Date/Time   CALCIUM 9.7 05/10/2013 1218   ALKPHOS 112 05/10/2013 1218   AST 15 05/10/2013 1218   ALT 20 05/10/2013 1218   BILITOT 0.47 05/10/2013 1218       RADIOGRAPHIC STUDIES:  Dg Chest 2 View  03/30/2013   CLINICAL DATA:  PREOP EVALUATION, RIGHT BREAST SURGERY  EXAM: CHEST  2 VIEW  COMPARISON:  DG CHEST 2V dated 02/19/2012  FINDINGS: The heart size and mediastinal contours are within normal limits. Both lungs are clear. The visualized skeletal structures are unremarkable.  IMPRESSION: No active cardiopulmonary disease.   Electronically Signed   By: Margaree Mackintosh M.D.   On: 03/30/2013 15:02   Mm Digital Diagnostic Unilat R  03/17/2013   CLINICAL DATA:  Suspicious right breast mass.  EXAM: POST-BIOPSY CLIP PLACEMENT RIGHT DIAGNOSTIC  MAMMOGRAM  COMPARISON:  Previous exams.  FINDINGS: Films are performed following ultrasound guided biopsy of suspicious right breast mass. Wing shaped biopsy marking clip in appropriate position.  IMPRESSION: Appropriate position biopsy marking clip status post ultrasound-guided core needle biopsy of suspicious right breast mass.  Final Assessment: Post Procedure Mammograms for Marker Placement   Electronically Signed   By: Lovey Newcomer M.D.   On: 03/17/2013 11:21   Mm Radiologist Eval And Mgmt  03/18/2013   EXAM: ESTABLISHED PATIENT OFFICE VISIT - LEVEL II ([id number])  EXAM: Ecchymosis of the lateral right breast at the site of biopsy as well as at the right axilla is noted. Clean bandages are in place.  HISTORY OF PRESENT ILLNESS: Patient presents following an ultrasound-guided biopsy of a right breast mass and right axillary lymph node. She has mild soreness in the right axilla and in the right breast at site of biopsy.  CHIEF COMPLAINT: Status post ultrasound-guided biopsy of a right breast mass and right axillary lymph node.  PATHOLOGY: Pathology of the tissue samples from 9 o'clock in the right breast demonstrates invasive mammary carcinoma. Pathology of the right axillary lymph node demonstrates metastatic carcinoma.  ASSESSMENT AND PLAN: ASSESSMENT AND PLAN Pathology is concordant with imaging findings. Surgical consultation is recommended. She is scheduled for the multidisciplinary clinic on 03/24/2013.   Electronically Signed   By: Donavan Burnet M.D.   On: 03/18/2013 13:33   [place] Rt Breast Bx W Loc Dev 1st Lesion Img Bx Spec US Guide  03/17/2013   CLINICAL DATA:  Patient with suspicious right breast mass and enlarged right axillary lymph node.  EXAM: ULTRASOUND GUIDED RIGHT BREAST CORE NEEDLE BIOPSY WITH VACUUM ASSIST  COMPARISON:  Previous exams.  PROCEDURE: I met with the patient and we discussed the procedure of ultrasound-guided biopsy, including benefits and alternatives. We discussed the high  likelihood of a successful procedure. We discussed the risks of the procedure including infection, bleeding, tissue injury, clip migration, and inadequate sampling. Informed written consent was given. The  usual time-out protocol was performed immediately prior to the procedure.  Using sterile technique and 2% Lidocaine as local anesthetic, under direct ultrasound visualization, a 12 gauge vacuum-assisteddevice was used to perform biopsy of suspicious right breast mass using a lateral approach. At the conclusion of the procedure, a wing shaped tissue marker clip was deployed into the biopsy cavity. Follow-up 2-view mammogram was performed and dictated separately.  Using sterile technique and 2% Lidocaine as local anesthetic, under direct ultrasound visualization, a 12 gauge vacuum-assisteddevice was used to perform biopsy of enlarged right axillary lymph node using a lateral approach.  IMPRESSION: Ultrasound-guided biopsy of suspicious right breast mass and enlarged right axillary lymph node. No apparent complications.   Electronically Signed   By: Lovey Newcomer M.D.   On: 03/17/2013 11:20   [place] Rt Breast Bx W Loc Dev Ea Add Lesion Img Bx Spec US Guide  03/17/2013   CLINICAL DATA:  Patient with suspicious right breast mass and enlarged right axillary lymph node.  EXAM: ULTRASOUND GUIDED RIGHT BREAST CORE NEEDLE BIOPSY WITH VACUUM ASSIST  COMPARISON:  Previous exams.  PROCEDURE: I met with the patient and we discussed the procedure of ultrasound-guided biopsy, including benefits and alternatives. We discussed the high likelihood of a successful procedure. We discussed the risks of the procedure including infection, bleeding, tissue injury, clip migration, and inadequate sampling. Informed written consent was given. The usual time-out protocol was performed immediately prior to the procedure.  Using sterile technique and 2% Lidocaine as local anesthetic, under direct ultrasound visualization, a 12 gauge  vacuum-assisteddevice was used to perform biopsy of suspicious right breast mass using a lateral approach. At the conclusion of the procedure, a wing shaped tissue marker clip was deployed into the biopsy cavity. Follow-up 2-view mammogram was performed and dictated separately.  Using sterile technique and 2% Lidocaine as local anesthetic, under direct ultrasound visualization, a 12 gauge vacuum-assisteddevice was used to perform biopsy of enlarged right axillary lymph node using a lateral approach.  IMPRESSION: Ultrasound-guided biopsy of suspicious right breast mass and enlarged right axillary lymph node. No apparent complications.   Electronically Signed   By: Lovey Newcomer M.D.   On: 03/17/2013 11:20   Mm Rt Plc Breast Loc Dev   1st Lesion  Inc Mammo Guide  03/30/2013   CLINICAL DATA:  Patient with right breast carcinoma and positive right axillary lymph node.  EXAM: ULTRASOUND NEEDLE LOCALIZATION OF THE RIGHT BREAST AND RIGHT AXILLA  COMPARISON:  Previous exams.  FINDINGS: Patient presents for needle localization prior to lumpectomy and right axillary lymph node excision. I met with the patient and we discussed the procedure of needle localization including benefits and alternatives. We discussed the high likelihood of a successful procedure. We discussed the risks of the procedure, including infection, bleeding, tissue injury, and further surgery. Informed, written consent was given.  The usual time-out protocol was performed immediately prior to the procedure.  Using ultrasound guidance, sterile technique, 2% lidocaine and a 7 cm modified Kopans needle was used to localize the abnormal right axillary lymph node and a 5 cm modified Kopan's needle was used to localize the right breast mass at 9 o'clock. A lateral to medial approach was used for both the node and mass. The films are marked for Dr. Donne Hazel.  Specimen radiograph is performed and confirms the clip and hookwire to be present in the tissue sample.  The specimen is marked for pathology. The localized axillary lymph node specimen was not radiographed.  IMPRESSION: Needle  localization of the right breast and an abnormal right axillary lymph node. No apparent complications.   Electronically Signed   By: Donavan Burnet M.D.   On: 03/30/2013 16:48   [place] Rt Plc Breast Loc Dev   1st Lesion  Inc US Guide  03/30/2013   CLINICAL DATA:  Patient with right breast carcinoma and positive right axillary lymph node.  EXAM: ULTRASOUND NEEDLE LOCALIZATION OF THE RIGHT BREAST AND RIGHT AXILLA  COMPARISON:  Previous exams.  FINDINGS: Patient presents for needle localization prior to lumpectomy and right axillary lymph node excision. I met with the patient and we discussed the procedure of needle localization including benefits and alternatives. We discussed the high likelihood of a successful procedure. We discussed the risks of the procedure, including infection, bleeding, tissue injury, and further surgery. Informed, written consent was given.  The usual time-out protocol was performed immediately prior to the procedure.  Using ultrasound guidance, sterile technique, 2% lidocaine and a 7 cm modified Kopans needle was used to localize the abnormal right axillary lymph node and a 5 cm modified Kopan's needle was used to localize the right breast mass at 9 o'clock. A lateral to medial approach was used for both the node and mass. The films are marked for Dr. Donne Hazel.  Specimen radiograph is performed and confirms the clip and hookwire to be present in the tissue sample. The specimen is marked for pathology. The localized axillary lymph node specimen was not radiographed.  IMPRESSION: Needle localization of the right breast and an abnormal right axillary lymph node. No apparent complications.   Electronically Signed   By: Donavan Burnet M.D.   On: 03/30/2013 16:48   [place] Rt Plc Breast Loc Dev   1st Lesion  Inc US Guide  03/30/2013   CLINICAL DATA:  Patient with right  breast carcinoma and positive right axillary lymph node.  EXAM: ULTRASOUND NEEDLE LOCALIZATION OF THE RIGHT BREAST AND RIGHT AXILLA  COMPARISON:  Previous exams.  FINDINGS: Patient presents for needle localization prior to lumpectomy and right axillary lymph node excision. I met with the patient and we discussed the procedure of needle localization including benefits and alternatives. We discussed the high likelihood of a successful procedure. We discussed the risks of the procedure, including infection, bleeding, tissue injury, and further surgery. Informed, written consent was given.  The usual time-out protocol was performed immediately prior to the procedure.  Using ultrasound guidance, sterile technique, 2% lidocaine and a 7 cm modified Kopans needle was used to localize the abnormal right axillary lymph node and a 5 cm modified Kopan's needle was used to localize the right breast mass at 9 o'clock. A lateral to medial approach was used for both the node and mass. The films are marked for Dr. Donne Hazel.  Specimen radiograph is performed and confirms the clip and hookwire to be present in the tissue sample. The specimen is marked for pathology. The localized axillary lymph node specimen was not radiographed.  IMPRESSION: Needle localization of the right breast and an abnormal right axillary lymph node. No apparent complications.   Electronically Signed   By: Donavan Burnet M.D.   On: 03/30/2013 16:48    ASSESSMENT/PLAN: 80 year old female with  #1 stage II (T1 N1) invasive ductal carcinoma, grade 3 ER PR positive HER-2/neu positive by repeat HER-2 receptor on final pathology of the right breast. S/P lumpectomy with SLN  #2 Adjuvant Chemotherapy: 1 day 8 of Taxol Herceptin. She is being given full doses.  #3 Blood work:  I have checked her CBC looks terrific she could proceed with her chemotherapy.  #4 anxiety: Patient is prescribed lorazepam to take as needed.  #5 weight loss: Patient has lost  about 5 pounds since her last visit. She states that she is not eating as well. We discussed different ways to improve her intake of calorie dense foods. I have also referred her to nutrition.  #6 patient is having great deal of difficulty with taking the potassium pills. I have recommended eating potassium dense foods. Also she is recommended coconut water.   #7 dehydration: Patient did have some episodes of dizziness at home. I have recommended she hydrate herself with water as well as Gatorade and coconut water.  #8 patient will be seen back in one week for cycle #1 day 15 of Taxol Herceptin.  All questions were answered. The patient knows to call the clinic with any problems, questions or concerns. We can certainly see the patient much sooner if necessary.  I spent 15 minutes counseling the patient face to face. The total time spent in the appointment was 25 minutes.    Marcy Panning, MD Medical/Oncology [facility] [phone] (beeper) [phone] (Office)  05/17/2013, 9:37 AM

## 2013-05-18 ENCOUNTER — Other Ambulatory Visit: Payer: Self-pay | Admitting: Oncology

## 2013-05-19 ENCOUNTER — Telehealth: Payer: Self-pay | Admitting: Oncology

## 2013-05-19 DIAGNOSIS — B3731 Acute candidiasis of vulva and vagina: Secondary | ICD-10-CM | POA: Insufficient documentation

## 2013-05-19 DIAGNOSIS — B373 Candidiasis of vulva and vagina: Secondary | ICD-10-CM | POA: Insufficient documentation

## 2013-05-19 NOTE — Telephone Encounter (Signed)
, °

## 2013-05-24 ENCOUNTER — Ambulatory Visit (HOSPITAL_BASED_OUTPATIENT_CLINIC_OR_DEPARTMENT_OTHER): Payer: Medicare Other | Admitting: Oncology

## 2013-05-24 ENCOUNTER — Encounter: Payer: Self-pay | Admitting: Oncology

## 2013-05-24 ENCOUNTER — Ambulatory Visit: Payer: Medicare Other | Admitting: Oncology

## 2013-05-24 ENCOUNTER — Ambulatory Visit (HOSPITAL_BASED_OUTPATIENT_CLINIC_OR_DEPARTMENT_OTHER): Payer: Medicare Other

## 2013-05-24 ENCOUNTER — Other Ambulatory Visit (HOSPITAL_BASED_OUTPATIENT_CLINIC_OR_DEPARTMENT_OTHER): Payer: Medicare Other

## 2013-05-24 ENCOUNTER — Other Ambulatory Visit: Payer: Medicare Other

## 2013-05-24 ENCOUNTER — Ambulatory Visit: Payer: Medicare Other | Admitting: Nutrition

## 2013-05-24 VITALS — BP 174/85 | HR 121 | Temp 98.0°F | Resp 18 | Ht 64.0 in | Wt 176.7 lb

## 2013-05-24 VITALS — BP 138/52 | HR 66 | Temp 98.8°F | Resp 18

## 2013-05-24 DIAGNOSIS — R197 Diarrhea, unspecified: Secondary | ICD-10-CM

## 2013-05-24 DIAGNOSIS — D649 Anemia, unspecified: Secondary | ICD-10-CM

## 2013-05-24 DIAGNOSIS — N289 Disorder of kidney and ureter, unspecified: Secondary | ICD-10-CM

## 2013-05-24 DIAGNOSIS — E860 Dehydration: Secondary | ICD-10-CM

## 2013-05-24 DIAGNOSIS — C50419 Malignant neoplasm of upper-outer quadrant of unspecified female breast: Secondary | ICD-10-CM

## 2013-05-24 DIAGNOSIS — C50919 Malignant neoplasm of unspecified site of unspecified female breast: Secondary | ICD-10-CM

## 2013-05-24 DIAGNOSIS — Z170 Estrogen receptor positive status [ER+]: Secondary | ICD-10-CM

## 2013-05-24 DIAGNOSIS — C50411 Malignant neoplasm of upper-outer quadrant of right female breast: Secondary | ICD-10-CM

## 2013-05-24 LAB — CBC WITH DIFFERENTIAL/PLATELET
BASO%: 0.4 % (ref 0.0–2.0)
Basophils Absolute: 0 10*3/uL (ref 0.0–0.1)
EOS%: 3.7 % (ref 0.0–7.0)
Eosinophils Absolute: 0.2 10*3/uL (ref 0.0–0.5)
HCT: 30.9 % — ABNORMAL LOW (ref 34.8–46.6)
HGB: 10.6 g/dL — ABNORMAL LOW (ref 11.6–15.9)
LYMPH%: 39.9 % (ref 14.0–49.7)
MCH: 29.9 pg (ref 25.1–34.0)
MCHC: 34.3 g/dL (ref 31.5–36.0)
MCV: 87 fL (ref 79.5–101.0)
MONO#: 0.4 10*3/uL (ref 0.1–0.9)
MONO%: 7.6 % (ref 0.0–14.0)
NEUT#: 2.4 10*3/uL (ref 1.5–6.5)
NEUT%: 48.4 % (ref 38.4–76.8)
Platelets: 226 10*3/uL (ref 145–400)
RBC: 3.55 10*6/uL — ABNORMAL LOW (ref 3.70–5.45)
RDW: 13.3 % (ref 11.2–14.5)
WBC: 4.9 10*3/uL (ref 3.9–10.3)
lymph#: 1.9 10*3/uL (ref 0.9–3.3)
nRBC: 0 % (ref 0–0)

## 2013-05-24 LAB — COMPREHENSIVE METABOLIC PANEL (CC13)
ALT: 26 U/L (ref 0–55)
AST: 11 U/L (ref 5–34)
Albumin: 3.1 g/dL — ABNORMAL LOW (ref 3.5–5.0)
Alkaline Phosphatase: 76 U/L (ref 40–150)
Anion Gap: 12 mEq/L — ABNORMAL HIGH (ref 3–11)
BUN: 42.3 mg/dL — ABNORMAL HIGH (ref 7.0–26.0)
CO2: 22 mEq/L (ref 22–29)
Calcium: 9 mg/dL (ref 8.4–10.4)
Chloride: 101 mEq/L (ref 98–109)
Creatinine: 1.7 mg/dL — ABNORMAL HIGH (ref 0.6–1.1)
Glucose: 242 mg/dl — ABNORMAL HIGH (ref 70–140)
Potassium: 3.8 mEq/L (ref 3.5–5.1)
Sodium: 135 mEq/L — ABNORMAL LOW (ref 136–145)
Total Bilirubin: 0.48 mg/dL (ref 0.20–1.20)
Total Protein: 5.9 g/dL — ABNORMAL LOW (ref 6.4–8.3)

## 2013-05-24 MED ORDER — SODIUM CHLORIDE 0.9 % IJ SOLN
10.0000 mL | INTRAMUSCULAR | Status: DC | PRN
Start: 2013-05-24 — End: 2013-05-24
  Administered 2013-05-24: 10 mL via INTRAVENOUS
  Filled 2013-05-24: qty 10

## 2013-05-24 MED ORDER — HEPARIN SOD (PORK) LOCK FLUSH 100 UNIT/ML IV SOLN
500.0000 [IU] | Freq: Once | INTRAVENOUS | Status: AC
  Administered 2013-05-24: 500 [IU] via INTRAVENOUS
  Filled 2013-05-24: qty 5

## 2013-05-24 MED ORDER — SODIUM CHLORIDE 0.9 % IV SOLN
INTRAVENOUS | Status: DC
  Administered 2013-05-24: 14:00:00 via INTRAVENOUS

## 2013-05-24 MED ORDER — FLUCONAZOLE 200 MG PO TABS: 200.0000 mg | ORAL_TABLET | Freq: Every day | ORAL | Status: DC

## 2013-05-24 NOTE — Progress Notes (Signed)
[place] OFFICE PROGRESS NOTE  Patient Care Team: [name], MD as PCP - General (Internal Medicine) Dr. Thea Silversmith  Dr. Rolm Bookbinder  DIAGNOSIS: : 80 year old now with new diagnosis of T1 N1 right breast cancer   Breast cancer of upper-outer quadrant of right female breast  Primary site: Breast (Right)  Staging method: AJCC 7th Edition  Clinical: Stage IA (T1c, N0, cM0)  Summary: Stage IA (T1c, N0, cM0)   SUMMARY OF ONCOLOGIC HISTORY: #1screening mammogram patient was found to have a right breast mass measuring 1 cm. There were no abnormalities on her physical exam. Patient had ultrasound performed that showed 1.3 x 1.2 x 1.0 cm hypoechoic mass in the upper outer quadrant of the right breast. In the right axilla there was an abnormal appearing lymph node measuring 1.65 1.4 x 0.8 cm. Both her biopsy. The lymph node was positive for metastatic carcinoma. The primary breast tumor biopsy showed invasive ductal carcinoma, grade 2, ER positive PR positive HER-2/neu equivocal with a proliferation marker Ki-67 15%  #2 status post right lumpectomy with the final pathology revealing 1.4 cm invasive ductal carcinoma grade 3 ER positive PR positive repeat HER-2/neu testing revealed the HER-2 receptor to be positive. She did have history of margin that was positive. She is status post reexcision with Port-A-Cath placement by Dr. Rolm Bookbinder.  #3 adjuvant curative intent chemotherapy: Consisting of Taxol/Herceptin beginning 05/10/2013. She will receive treatment day 1, day 8, day 15, day 22 on a 28 day cycle for a total of 4 cycles. Patient understands risks benefits and side effects. She had an echocardiogram performed on 04/22/2013 with EF of 65-70%    CURRENT THERAPY: Cycle 1 day 15 Taxol/Herceptin 05/24/13 (held)   INTERVAL HISTORY: Sabrina [name] 80 y.o. female returns for cycle 1 day 15 of Taxol Herceptin today. Unfortunately she will not be getting it because  of significant weakness fatigue and diarrhea. She's had constant diarrhea for the last 3 days. She has also developed a vaginal yeast infection. She took to Diflucan this but the area has not resolved. I am giving her a few more days of Diflucan. She does have a tendency of getting yeast infections quite a bit in the past as well. She has not had any nausea or vomiting. But she has significant fatigue and weakness. She is not eating and drinking as much. She has not had any syncopal episodes no dizziness. Her tongue and mucosa is dry. She has not had any bleeding. She is denying any peripheral paresthesias.  I have reviewed the past medical history, past surgical history, social history and family history with the patient and they are unchanged from previous note.  ALLERGIES:  has No Known Allergies.  MEDICATIONS:  Current Outpatient Prescriptions  Medication Sig Dispense Refill  . atorvastatin (LIPITOR) 10 MG tablet Take 10 mg by mouth daily.      . Biotin 1000 MCG tablet Take 1,000 mcg by mouth daily.      . Calcium Citrate (CITRACAL PO) Take 1 tablet by mouth daily.       . Cholecalciferol (VITAMIN D-3) 1000 UNITS CAPS Take 1,000 Units by mouth daily.      . cloNIDine (CATAPRES) 0.2 MG tablet Take 0.2 mg by mouth 2 (two) times daily.      [name] dexamethasone (DECADRON) 4 MG tablet       . furosemide (LASIX) 40 MG tablet Take 40 mg by mouth daily.      [name] HYDROcodone-acetaminophen (  NORCO/VICODIN) 5-325 MG per tablet Take 1-2 tablets by mouth every 4 (four) hours as needed for moderate pain.  20 tablet  0  . IRON PO Take 65 mg by mouth daily.      [name] lidocaine-prilocaine (EMLA) cream Apply 1 - 2 hours to port -a-cath before being accessed.  30 g  6  . LORazepam (ATIVAN) 0.5 MG tablet Take 1 tablet (0.5 mg total) by mouth every 6 (six) hours as needed (Nausea or vomiting).  30 tablet  0  . metFORMIN (GLUCOPHAGE) 850 MG tablet Take 850 mg by mouth 2 (two) times daily with a meal.      . Multiple  Vitamins-Minerals (MULTIVITAMIN PO) Take 1 tablet by mouth daily.       . ondansetron (ZOFRAN) 8 MG tablet       . potassium chloride 20 MEQ TBCR Take 20 mEq by mouth daily.  30 tablet  3  . prochlorperazine (COMPAZINE) 10 MG tablet       . traMADol (ULTRAM) 50 MG tablet Take 50 mg by mouth 2 (two) times daily.      . valACYclovir (VALTREX) 500 MG tablet Take 1 tablet (500 mg total) by mouth daily. For 7 days  7 tablet  3  . verapamil (VERELAN PM) 240 MG 24 hr capsule Take 240 mg by mouth daily.       . fluconazole (DIFLUCAN) 200 MG tablet Take 1 tablet (200 mg total) by mouth daily.  5 tablet  1   No current facility-administered medications for this visit.    REVIEW OF SYSTEMS:   Constitutional: Denies fevers, chills. she has had abnormal weight loss Eyes: Denies blurriness of vision Ears, nose, mouth, throat, and face: She has dried mucosa with some difficulty in swallowing but no thrush is noted Respiratory: Denies cough, dyspnea or wheezes Cardiovascular: Denies palpitation, chest discomfort or lower extremity swelling Gastrointestinal:  Denies nausea, heartburn . She does develop diarrhea for the last 3 days Skin: Denies abnormal skin rashes Lymphatics: Denies new lymphadenopathy or easy bruising Neurological:Denies numbness, tingling or new weaknesses in her lower extremities, no syncopal episode Behavioral/Psych: Mood is stable, no new changes  All other systems were reviewed with the patient and are negative.  PHYSICAL EXAMINATION: ECOG PERFORMANCE STATUS: 2 - Symptomatic, <50% confined to bed  Filed Vitals:   05/24/13 1250  BP: 174/85  Pulse: 121  Temp: 98 F (36.7 C)  Resp: 18   Filed Weights   05/24/13 1250  Weight: 176 lb 11.2 oz (80.151 kg)    GENERAL:alert, no distress and comfortable SKIN: skin color, texture, turgor are normal, no rashes or significant lesions EYES: normal, Conjunctiva are pink and non-injected, sclera clear OROPHARYNX:no exudate, no  erythema and lips, buccal mucosa, and tongue normal  NECK: supple, thyroid normal size, non-tender, without nodularity LYMPH:  no palpable lymphadenopathy in the cervical, axillary or inguinal LUNGS: clear to auscultation and percussion with normal breathing effort HEART: regular rate & rhythm and no murmurs and no lower extremity edema ABDOMEN:abdomen soft, non-tender and normal bowel sounds Musculoskeletal:no cyanosis of digits and no clubbing  NEURO: alert & oriented x 3 with fluent speech, no focal motor/sensory deficits Breast examination: Left breast no masses or nipple discharge, right breast well-healed surgical scar. Port-A-Cath site looks normal without any infection LABORATORY DATA:  I have reviewed the data as listed    Component Value Date/Time   NA 140 05/17/2013 0844   K 4.2 05/17/2013 0844   CO2 25 05/17/2013  0844   GLUCOSE 283* 05/17/2013 0844   BUN 49.0* 05/17/2013 0844   CREATININE 1.8* 05/17/2013 0844   CALCIUM 9.2 05/17/2013 0844   PROT 6.5 05/17/2013 0844   ALBUMIN 3.5 05/17/2013 0844   AST 13 05/17/2013 0844   ALT 27 05/17/2013 0844   ALKPHOS 86 05/17/2013 0844   BILITOT 0.65 05/17/2013 0844    No results found for this basename: SPEP, UPEP,  kappa and lambda light chains    Lab Results  Component Value Date   WBC 4.9 05/24/2013   NEUTROABS 2.4 05/24/2013   HGB 10.6* 05/24/2013   HCT 30.9* 05/24/2013   MCV 87.0 05/24/2013   PLT 226 05/24/2013      Chemistry      Component Value Date/Time   NA 140 05/17/2013 0844   K 4.2 05/17/2013 0844   CO2 25 05/17/2013 0844   BUN 49.0* 05/17/2013 0844   CREATININE 1.8* 05/17/2013 0844      Component Value Date/Time   CALCIUM 9.2 05/17/2013 0844   ALKPHOS 86 05/17/2013 0844   AST 13 05/17/2013 0844   ALT 27 05/17/2013 0844   BILITOT 0.65 05/17/2013 0844       RADIOGRAPHIC STUDIES: I have personally reviewed the radiological images as listed and agreed with the findings in the report. No results found.    ASSESSMENT & PLAN:   80 year old female with  #1 stage II (T1 N1) invasive ductal carcinoma, grade 3, ER positive PR positive HER-2/neu positive. Patient is status post lumpectomy. She was then begun on adjuvant curative intent chemotherapy consisting of Taxol Herceptin beginning 05/10/2013. Thus far she is received cycle 1 days one and 8. She was here for day 15. Unfortunately she is having significant side effects from it. We will hold chemotherapy today.  #2 diarrhea: As a consequence from chemotherapy. She is recommended to take Imodium  #3 dehydration: As a result of poor by mouth intake and diarrhea we will give her IV fluids today and tomorrow with normal saline.  #4 anemia: Hemoglobin is 10.6 with a hematocrit of 30.9. Patient has not required blood transfusion. This is likely secondary to her chemotherapy. We will continue to observe.  #5 renal insufficiency: BUN was 49 and creatinine 1.8 on her last visit. I do think that she will benefit from IV fluids.  #6 patient will be seen back in one week's time however she will not receive any chemotherapy. Her next cycle of chemotherapy will begin on 06/14/2013. Question really arises whether or not we should dose attenuate the Taxol. Patient and I discussed this extensively today. I do think it may be the right thing to do in this individual as she is having significant problems with full doses of Taxol.  No orders of the defined types were placed in this encounter.   All questions were answered. The patient knows to call the clinic with any problems, questions or concerns. No barriers to learning was detected. I spent 30 minutes counseling the patient face to face. The total time spent in the appointment was 40 minutes and more than 50% was on counseling and review of test results and coordination of care     Marcy Panning, MD 05/24/2013 1:39 PM

## 2013-05-24 NOTE — Progress Notes (Signed)
Pt reports yeast infection - gyn ordered flucanoazole - pt completed Saturday.

## 2013-05-24 NOTE — Patient Instructions (Signed)
Diarrhea Diarrhea is frequent loose and watery bowel movements. It can cause you to feel weak and dehydrated. Dehydration can cause you to become tired and thirsty, have a dry mouth, and have decreased urination that often is dark yellow. Diarrhea is a sign of another problem, most often an infection that will not last long. In most cases, diarrhea typically lasts 2 3 days. However, it can last longer if it is a sign of something more serious. It is important to treat your diarrhea as directed by your caregive to lessen or prevent future episodes of diarrhea. CAUSES  Some common causes include:  Gastrointestinal infections caused by viruses, bacteria, or parasites.  Food poisoning or food allergies.  Certain medicines, such as antibiotics, chemotherapy, and laxatives.  Artificial sweeteners and fructose.  Digestive disorders. HOME CARE INSTRUCTIONS  Ensure adequate fluid intake (hydration): have 1 cup (8 oz) of fluid for each diarrhea episode. Avoid fluids that contain simple sugars or sports drinks, fruit juices, whole milk products, and sodas. Your urine should be clear or pale yellow if you are drinking enough fluids. Hydrate with an oral rehydration solution that you can purchase at pharmacies, retail stores, and online. You can prepare an oral rehydration solution at home by mixing the following ingredients together:    tsp table salt.   tsp baking soda.   tsp salt substitute containing potassium chloride.  1  tablespoons sugar.  1 L (34 oz) of water.  Certain foods and beverages may increase the speed at which food moves through the gastrointestinal (GI) tract. These foods and beverages should be avoided and include:  Caffeinated and alcoholic beverages.  High-fiber foods, such as raw fruits and vegetables, nuts, seeds, and whole grain breads and cereals.  Foods and beverages sweetened with sugar alcohols, such as xylitol, sorbitol, and mannitol.  Some foods may be well  tolerated and may help thicken stool including:  Starchy foods, such as rice, toast, pasta, low-sugar cereal, oatmeal, grits, baked potatoes, crackers, and bagels.  Bananas.  Applesauce.  Add probiotic-rich foods to help increase healthy bacteria in the GI tract, such as yogurt and fermented milk products.  Wash your hands well after each diarrhea episode.  Only take over-the-counter or prescription medicines as directed by your caregiver.  Take a warm bath to relieve any burning or pain from frequent diarrhea episodes. SEEK IMMEDIATE MEDICAL CARE IF:   You are unable to keep fluids down.  You have persistent vomiting.  You have blood in your stool, or your stools are black and tarry.  You do not urinate in 6 8 hours, or there is only a small amount of very dark urine.  You have abdominal pain that increases or localizes.  You have weakness, dizziness, confusion, or lightheadedness.  You have a severe headache.  Your diarrhea gets worse or does not get better.  You have a fever or persistent symptoms for more than 2 3 days.  You have a fever and your symptoms suddenly get worse. MAKE SURE YOU:   Understand these instructions.  Will watch your condition.  Will get help right away if you are not doing well or get worse. Document Released: 02/08/2002 Document Revised: 02/05/2012 Document Reviewed: 10/27/2011 [facility] Patient Information 2014 [place], [place].  Yeast Infection of the Skin Some yeast on the skin is normal, but sometimes it causes an infection. If you have a yeast infection, it shows up as white or light brown patches on brown skin. You can see it better in  the summer on tan skin. It causes light-colored holes in your suntan. It can happen on any area of the body. This cannot be passed from person to person. HOME CARE  Scrub your skin daily with a dandruff shampoo. Your rash may take a couple weeks to get well.  Do not scratch or itch the rash. GET HELP  RIGHT AWAY IF:   You get another infection from scratching. The skin may get warm, red, and may ooze fluid.  The infection does not seem to be getting better. MAKE SURE YOU:  Understand these instructions.  Will watch your condition.  Will get help right away if you are not doing well or get worse. Document Released: 02/01/2008 Document Revised: 05/13/2011 Document Reviewed: 02/01/2008 [facility] Patient Information 2014 [place]. Dehydration, Adult Dehydration is when you lose more fluids from the body than you take in. Vital organs like the kidneys, brain, and heart cannot function without a proper amount of fluids and salt. Any loss of fluids from the body can cause dehydration.  CAUSES   Vomiting.  Diarrhea.  Excessive sweating.  Excessive urine output.  Fever. SYMPTOMS  Mild dehydration  Thirst.  Dry lips.  Slightly dry mouth. Moderate dehydration  Very dry mouth.  Sunken eyes.  Skin does not bounce back quickly when lightly pinched and released.  Dark urine and decreased urine production.  Decreased tear production.  Headache. Severe dehydration  Very dry mouth.  Extreme thirst.  Rapid, weak pulse (more than 100 beats per minute at rest).  Cold hands and feet.  Not able to sweat in spite of heat and temperature.  Rapid breathing.  Blue lips.  Confusion and lethargy.  Difficulty being awakened.  Minimal urine production.  No tears. DIAGNOSIS  Your caregiver will diagnose dehydration based on your symptoms and your exam. Blood and urine tests will help confirm the diagnosis. The diagnostic evaluation should also identify the cause of dehydration. TREATMENT  Treatment of mild or moderate dehydration can often be done at home by increasing the amount of fluids that you drink. It is best to drink small amounts of fluid more often. Drinking too much at one time can make vomiting worse. Refer to the home care instructions below. Severe  dehydration needs to be treated at the hospital where you will probably be given intravenous (IV) fluids that contain water and electrolytes. HOME CARE INSTRUCTIONS   Ask your caregiver about specific rehydration instructions.  Drink enough fluids to keep your urine clear or pale yellow.  Drink small amounts frequently if you have nausea and vomiting.  Eat as you normally do.  Avoid:  Foods or drinks high in sugar.  Carbonated drinks.  Juice.  Extremely hot or cold fluids.  Drinks with caffeine.  Fatty, greasy foods.  Alcohol.  Tobacco.  Overeating.  Gelatin desserts.  Wash your hands well to avoid spreading bacteria and viruses.  Only take over-the-counter or prescription medicines for pain, discomfort, or fever as directed by your caregiver.  Ask your caregiver if you should continue all prescribed and over-the-counter medicines.  Keep all follow-up appointments with your caregiver. SEEK MEDICAL CARE IF:  You have abdominal pain and it increases or stays in one area (localizes).  You have a rash, stiff neck, or severe headache.  You are irritable, sleepy, or difficult to awaken.  You are weak, dizzy, or extremely thirsty. SEEK IMMEDIATE MEDICAL CARE IF:   You are unable to keep fluids down or you get worse despite treatment.  You have frequent episodes of vomiting or diarrhea.  You have blood or green matter (bile) in your vomit.  You have blood in your stool or your stool looks black and tarry.  You have not urinated in 6 to 8 hours, or you have only urinated a small amount of very dark urine.  You have a fever.  You faint. MAKE SURE YOU:   Understand these instructions.  Will watch your condition.  Will get help right away if you are not doing well or get worse. Document Released: 02/18/2005 Document Revised: 05/13/2011 Document Reviewed: 10/08/2010 [facility] Patient Information 2014 [place], [place].

## 2013-05-24 NOTE — Patient Instructions (Signed)
Dehydration, Adult Dehydration is when you lose more fluids from the body than you take in. Vital organs like the kidneys, brain, and heart cannot function without a proper amount of fluids and salt. Any loss of fluids from the body can cause dehydration.  CAUSES   Vomiting.  Diarrhea.  Excessive sweating.  Excessive urine output.  Fever. SYMPTOMS  Mild dehydration  Thirst.  Dry lips.  Slightly dry mouth. Moderate dehydration  Very dry mouth.  Sunken eyes.  Skin does not bounce back quickly when lightly pinched and released.  Dark urine and decreased urine production.  Decreased tear production.  Headache. Severe dehydration  Very dry mouth.  Extreme thirst.  Rapid, weak pulse (more than 100 beats per minute at rest).  Cold hands and feet.  Not able to sweat in spite of heat and temperature.  Rapid breathing.  Blue lips.  Confusion and lethargy.  Difficulty being awakened.  Minimal urine production.  No tears. DIAGNOSIS  Your caregiver will diagnose dehydration based on your symptoms and your exam. Blood and urine tests will help confirm the diagnosis. The diagnostic evaluation should also identify the cause of dehydration. TREATMENT  Treatment of mild or moderate dehydration can often be done at home by increasing the amount of fluids that you drink. It is best to drink small amounts of fluid more often. Drinking too much at one time can make vomiting worse. Refer to the home care instructions below. Severe dehydration needs to be treated at the hospital where you will probably be given intravenous (IV) fluids that contain water and electrolytes. HOME CARE INSTRUCTIONS   Ask your caregiver about specific rehydration instructions.  Drink enough fluids to keep your urine clear or pale yellow.  Drink small amounts frequently if you have nausea and vomiting.  Eat as you normally do.  Avoid:  Foods or drinks high in sugar.  Carbonated  drinks.  Juice.  Extremely hot or cold fluids.  Drinks with caffeine.  Fatty, greasy foods.  Alcohol.  Tobacco.  Overeating.  Gelatin desserts.  Wash your hands well to avoid spreading bacteria and viruses.  Only take over-the-counter or prescription medicines for pain, discomfort, or fever as directed by your caregiver.  Ask your caregiver if you should continue all prescribed and over-the-counter medicines.  Keep all follow-up appointments with your caregiver. SEEK MEDICAL CARE IF:  You have abdominal pain and it increases or stays in one area (localizes).  You have a rash, stiff neck, or severe headache.  You are irritable, sleepy, or difficult to awaken.  You are weak, dizzy, or extremely thirsty. SEEK IMMEDIATE MEDICAL CARE IF:   You are unable to keep fluids down or you get worse despite treatment.  You have frequent episodes of vomiting or diarrhea.  You have blood or green matter (bile) in your vomit.  You have blood in your stool or your stool looks black and tarry.  You have not urinated in 6 to 8 hours, or you have only urinated a small amount of very dark urine.  You have a fever.  You faint. MAKE SURE YOU:   Understand these instructions.  Will watch your condition.  Will get help right away if you are not doing well or get worse. Document Released: 02/18/2005 Document Revised: 05/13/2011 Document Reviewed: 10/08/2010 [employer] Patient Information 2014 [employer].  

## 2013-05-24 NOTE — Progress Notes (Signed)
80 year old female diagnosed with breast cancer receiving chemotherapy, radiation treatment.  Past medical history includes heart murmur, hypertension, non-insulin-dependent diabetes, chronic kidney disease, stage III, and hypercholesterolemia.  Medications include Lipitor, Biotin, calcium citrate, Decadron, Lasix, iron, Ativan, Glucophage, multivitamin, Zofran, and Compazine.  Labs include glucose 283, BUN 49, creatinine 1.8 on March 16.  Height: 64 inches. Weight: 176.7 pounds March 23. Usual body weight: 185 pounds. BMI: 30.32.  Patient reports decreased oral intake and diarrhea over the past week.  She has been eating less than usual.  Patient reports diarrhea improves when she takes Imodium.  Patient receiving fluids today.  Nutrition diagnosis: Unintended weight loss related to decreased oral intake as evidenced by 4% weight loss from usual body weight.  Intervention: Patient has been educated on the importance of smaller, more frequent meals and snacks concentrating on healthy protein sources.  Patient educated to continue to avoid concentrated sources of sugar.  Reviewed safe food handling.  Educated patient on foods to eat to improve diarrhea.  Questions were answered.  Teach back method used.  Monitoring, evaluation, goals: Patient will tolerate adequate calories and protein to minimize further weight loss.  Next visit: I will followup with patient as needed throughout chemotherapy.

## 2013-05-25 ENCOUNTER — Ambulatory Visit (HOSPITAL_BASED_OUTPATIENT_CLINIC_OR_DEPARTMENT_OTHER): Payer: Medicare Other

## 2013-05-25 VITALS — BP 129/52 | HR 75 | Temp 97.0°F | Resp 18

## 2013-05-25 DIAGNOSIS — E860 Dehydration: Secondary | ICD-10-CM

## 2013-05-25 DIAGNOSIS — R197 Diarrhea, unspecified: Secondary | ICD-10-CM

## 2013-05-25 MED ORDER — SODIUM CHLORIDE 0.9 % IV SOLN
INTRAVENOUS | Status: DC
  Administered 2013-05-25: 09:00:00 via INTRAVENOUS

## 2013-05-25 MED ORDER — SODIUM CHLORIDE 0.9 % IJ SOLN
10.0000 mL | INTRAMUSCULAR | Status: DC | PRN
  Administered 2013-05-25: 10 mL via INTRAVENOUS
  Filled 2013-05-25: qty 10

## 2013-05-25 MED ORDER — HEPARIN SOD (PORK) LOCK FLUSH 100 UNIT/ML IV SOLN
500.0000 [IU] | Freq: Once | INTRAVENOUS | Status: AC
  Administered 2013-05-25: 500 [IU] via INTRAVENOUS
  Filled 2013-05-25: qty 5

## 2013-05-25 NOTE — Patient Instructions (Signed)
Diarrhea Diarrhea is watery poop (stool). It can make you feel weak, tired, thirsty, or give you a dry mouth (signs of dehydration). Watery poop is a sign of another problem, most often an infection. It often lasts 2 3 days. It can last longer if it is a sign of something serious. Take care of yourself as told by your doctor. HOME CARE   Drink 1 cup (8 ounces) of fluid each time you have watery poop.  Do not drink the following fluids:  Those that contain simple sugars (fructose, glucose, galactose, lactose, sucrose, maltose).  Sports drinks.  Fruit juices.  Whole milk products.  Sodas.  Drinks with caffeine (coffee, tea, soda) or alcohol.  Oral rehydration solution may be used if the doctor says it is okay. You may make your own solution. Follow this recipe:    teaspoon table salt.   teaspoon baking soda.   teaspoon salt substitute containing potassium chloride.  1 tablespoons sugar.  1 liter (34 ounces) of water.  Avoid the following foods:  High fiber foods, such as raw fruits and vegetables.  Nuts, seeds, and whole grain breads and cereals.   Those that are sweetened with sugar alcohols (xylitol, sorbitol, mannitol).  Try eating the following foods:  Starchy foods, such as rice, toast, pasta, low-sugar cereal, oatmeal, baked potatoes, crackers, and bagels.  Bananas.  Applesauce.  Eat probiotic-rich foods, such as yogurt and milk products that are fermented.  Wash your hands well after each time you have watery poop.  Only take medicine as told by your doctor.  Take a warm bath to help lessen burning or pain from having watery poop. GET HELP RIGHT AWAY IF:   You cannot drink fluids without throwing up (vomiting).  You keep throwing up.  You have blood in your poop, or your poop looks black and tarry.  You do not pee (urinate) in 6 8 hours, or there is only a small amount of very dark pee.  You have belly (abdominal) pain that gets worse or stays in  the same spot (localizes).  You are weak, dizzy, confused, or lightheaded.  You have a very bad headache.  Your watery poop gets worse or does not get better.  You have a fever or lasting symptoms for more than 2 3 days.  You have a fever and your symptoms suddenly get worse. MAKE SURE YOU:   Understand these instructions.  Will watch your condition.  Will get help right away if you are not doing well or get worse. Document Released: 08/07/2007 Document Revised: 11/13/2011 Document Reviewed: 10/27/2011 Exit[employer] Patient Information 2014 [employer].  

## 2013-05-28 ENCOUNTER — Other Ambulatory Visit (HOSPITAL_COMMUNITY): Payer: Self-pay | Admitting: *Deleted

## 2013-05-28 MED ORDER — FUROSEMIDE 40 MG PO TABS: 40.0000 mg | ORAL_TABLET | Freq: Every day | ORAL | Status: DC

## 2013-05-31 ENCOUNTER — Ambulatory Visit (HOSPITAL_BASED_OUTPATIENT_CLINIC_OR_DEPARTMENT_OTHER): Payer: Medicare Other

## 2013-05-31 ENCOUNTER — Ambulatory Visit: Payer: Medicare Other | Admitting: Genetic Counselor

## 2013-05-31 ENCOUNTER — Ambulatory Visit (HOSPITAL_BASED_OUTPATIENT_CLINIC_OR_DEPARTMENT_OTHER): Payer: Medicare Other | Admitting: Oncology

## 2013-05-31 ENCOUNTER — Telehealth: Payer: Self-pay | Admitting: Adult Health

## 2013-05-31 ENCOUNTER — Encounter: Payer: Self-pay | Admitting: Genetic Counselor

## 2013-05-31 ENCOUNTER — Other Ambulatory Visit (HOSPITAL_BASED_OUTPATIENT_CLINIC_OR_DEPARTMENT_OTHER): Payer: Medicare Other

## 2013-05-31 ENCOUNTER — Encounter: Payer: Self-pay | Admitting: Oncology

## 2013-05-31 ENCOUNTER — Telehealth: Payer: Self-pay | Admitting: *Deleted

## 2013-05-31 ENCOUNTER — Other Ambulatory Visit: Payer: Medicare Other

## 2013-05-31 ENCOUNTER — Ambulatory Visit: Payer: Medicare Other

## 2013-05-31 VITALS — BP 167/70 | HR 97 | Temp 98.2°F | Resp 18 | Ht 64.0 in | Wt 179.6 lb

## 2013-05-31 DIAGNOSIS — C50411 Malignant neoplasm of upper-outer quadrant of right female breast: Secondary | ICD-10-CM

## 2013-05-31 DIAGNOSIS — Z803 Family history of malignant neoplasm of breast: Secondary | ICD-10-CM

## 2013-05-31 DIAGNOSIS — C50919 Malignant neoplasm of unspecified site of unspecified female breast: Secondary | ICD-10-CM

## 2013-05-31 DIAGNOSIS — E860 Dehydration: Secondary | ICD-10-CM

## 2013-05-31 DIAGNOSIS — C773 Secondary and unspecified malignant neoplasm of axilla and upper limb lymph nodes: Secondary | ICD-10-CM

## 2013-05-31 DIAGNOSIS — D649 Anemia, unspecified: Secondary | ICD-10-CM

## 2013-05-31 DIAGNOSIS — Z5112 Encounter for antineoplastic immunotherapy: Secondary | ICD-10-CM

## 2013-05-31 DIAGNOSIS — N289 Disorder of kidney and ureter, unspecified: Secondary | ICD-10-CM

## 2013-05-31 DIAGNOSIS — Z170 Estrogen receptor positive status [ER+]: Secondary | ICD-10-CM

## 2013-05-31 DIAGNOSIS — C50419 Malignant neoplasm of upper-outer quadrant of unspecified female breast: Secondary | ICD-10-CM

## 2013-05-31 LAB — CBC WITH DIFFERENTIAL/PLATELET
BASO%: 0.8 % (ref 0.0–2.0)
Basophils Absolute: 0.1 10*3/uL (ref 0.0–0.1)
EOS%: 0.7 % (ref 0.0–7.0)
Eosinophils Absolute: 0.1 10*3/uL (ref 0.0–0.5)
HCT: 29.9 % — ABNORMAL LOW (ref 34.8–46.6)
HGB: 9.8 g/dL — ABNORMAL LOW (ref 11.6–15.9)
LYMPH%: 18.1 % (ref 14.0–49.7)
MCH: 29.7 pg (ref 25.1–34.0)
MCHC: 32.7 g/dL (ref 31.5–36.0)
MCV: 91 fL (ref 79.5–101.0)
MONO#: 0.8 10*3/uL (ref 0.1–0.9)
MONO%: 10.4 % (ref 0.0–14.0)
NEUT#: 5.7 10*3/uL (ref 1.5–6.5)
NEUT%: 70 % (ref 38.4–76.8)
Platelets: 239 10*3/uL (ref 145–400)
RBC: 3.29 10*6/uL — ABNORMAL LOW (ref 3.70–5.45)
RDW: 14.1 % (ref 11.2–14.5)
WBC: 8.1 10*3/uL (ref 3.9–10.3)
lymph#: 1.5 10*3/uL (ref 0.9–3.3)

## 2013-05-31 LAB — COMPREHENSIVE METABOLIC PANEL (CC13)
ALT: 19 U/L (ref 0–55)
AST: 14 U/L (ref 5–34)
Albumin: 3.1 g/dL — ABNORMAL LOW (ref 3.5–5.0)
Alkaline Phosphatase: 92 U/L (ref 40–150)
Anion Gap: 12 mEq/L — ABNORMAL HIGH (ref 3–11)
BUN: 27.2 mg/dL — ABNORMAL HIGH (ref 7.0–26.0)
CO2: 24 mEq/L (ref 22–29)
Calcium: 8.8 mg/dL (ref 8.4–10.4)
Chloride: 99 mEq/L (ref 98–109)
Creatinine: 1.7 mg/dL — ABNORMAL HIGH (ref 0.6–1.1)
Glucose: 302 mg/dl — ABNORMAL HIGH (ref 70–140)
Potassium: 4.4 mEq/L (ref 3.5–5.1)
Sodium: 135 mEq/L — ABNORMAL LOW (ref 136–145)
Total Bilirubin: 0.41 mg/dL (ref 0.20–1.20)
Total Protein: 6.4 g/dL (ref 6.4–8.3)

## 2013-05-31 MED ORDER — TRASTUZUMAB CHEMO INJECTION 440 MG
2.0000 mg/kg | Freq: Once | INTRAVENOUS | Status: AC
  Administered 2013-05-31: 168 mg via INTRAVENOUS
  Filled 2013-05-31: qty 8

## 2013-05-31 MED ORDER — DIPHENHYDRAMINE HCL 25 MG PO CAPS
50.0000 mg | ORAL_CAPSULE | Freq: Once | ORAL | Status: AC
  Administered 2013-05-31: 50 mg via ORAL

## 2013-05-31 MED ORDER — SODIUM CHLORIDE 0.9 % IV SOLN
Freq: Once | INTRAVENOUS | Status: AC
  Administered 2013-05-31: 14:00:00 via INTRAVENOUS

## 2013-05-31 MED ORDER — SODIUM CHLORIDE 0.9 % IJ SOLN
10.0000 mL | INTRAMUSCULAR | Status: DC | PRN
  Administered 2013-05-31: 10 mL
  Filled 2013-05-31: qty 10

## 2013-05-31 MED ORDER — ACETAMINOPHEN 325 MG PO TABS
650.0000 mg | ORAL_TABLET | Freq: Once | ORAL | Status: AC
  Administered 2013-05-31: 650 mg via ORAL

## 2013-05-31 MED ORDER — ACETAMINOPHEN 325 MG PO TABS
ORAL_TABLET | ORAL | Status: AC
  Filled 2013-05-31: qty 2

## 2013-05-31 MED ORDER — HEPARIN SOD (PORK) LOCK FLUSH 100 UNIT/ML IV SOLN
500.0000 [IU] | Freq: Once | INTRAVENOUS | Status: AC | PRN
  Administered 2013-05-31: 500 [IU]
  Filled 2013-05-31: qty 5

## 2013-05-31 MED ORDER — DIPHENHYDRAMINE HCL 25 MG PO CAPS
ORAL_CAPSULE | ORAL | Status: AC
  Filled 2013-05-31: qty 2

## 2013-05-31 NOTE — Progress Notes (Signed)
Ok to treat with creatinine 1.7 per Dr. Humphrey Rolls.  Patient to receive herceptin only per Dr.Khan.

## 2013-05-31 NOTE — Progress Notes (Signed)
Dr.  Marcy Panning requested a consultation for genetic counseling and risk assessment for [facility], a 80 y.o. female, for discussion of her personal history of breast cancer and family history of breast cancer.  She presents to clinic today to discuss the possibility of a genetic predisposition to cancer, and to further clarify her risks, as well as her family members' risks for cancer.   HISTORY OF PRESENT ILLNESS: In 2015, at the age of 33, Sabrina [name] was diagnosed with invasive ductal carcinoma of the right breast. This was treated with taxol and herceptin. Her tumor is triple positive.    Past Medical History  Diagnosis Date  . Breast cancer 03/2013    right  . Heart murmur     no known problems; states did not know she had murmur until age 44  . Immature cataract   . Arthritis     neck  . Wears partial dentures     lower  . Dental crowns present   . Hypertension     fluctuates, especially when stressed; has been on med. > 20 yr.  . Non-insulin dependent type 2 diabetes mellitus   . High cholesterol   . Chronic kidney disease (CKD), stage III (moderate)     nephrologist, Dr. [name]    Past Surgical History  Procedure Laterality Date  . Tonsillectomy      as a child  . Breast surgery Right 11/1958    right breast biopsy benign  . Dilation and curettage of uterus    . Breast lumpectomy with needle localization Right 03/30/2013    Procedure: BREAST LUMPECTOMY WITH NEEDLE LOCALIZATION;  Surgeon: Rolm Bookbinder, MD;  Location: [facility];  Service: General;  Laterality: Right;  . Axillary lymph node dissection Right 03/30/2013    Procedure: AXILLARY LYMPH NODE DISSECTION;  Surgeon: Rolm Bookbinder, MD;  Location: [facility];  Service: General;  Laterality: Right;  . Re-excision of breast cancer,superior margins Right 04/15/2013    Procedure: RE-EXCISION OF RIGHT BREAST  MARGINS;  Surgeon: Rolm Bookbinder, MD;  Location: [facility];  Service: General;   Laterality: Right;  . Portacath placement N/A 04/15/2013    Procedure: INSERTION PORT-A-CATH;  Surgeon: Rolm Bookbinder, MD;  Location: [facility];  Service: General;  Laterality: N/A;    History   Social History  . Marital Status: Married    Spouse Name: N/A    Number of Children: 0  . Years of Education: N/A   Social History Main Topics  . Smoking status: Former Research scientist (life sciences)  . Smokeless tobacco: Never Used     Comment: quit smoking in 1965  . Alcohol Use: 0.0 oz/week     Comment: 2-3 glasses wine/week  . Drug Use: No  . Sexual Activity: Not Currently   Other Topics Concern  . None   Social History Narrative  . None    REPRODUCTIVE HISTORY AND PERSONAL RISK ASSESSMENT FACTORS: Menarche was at age 46.   postmenopausal Uterus Intact: yes Ovaries Intact: yes [id number], first live birth at age N/A  She has not previously undergone treatment for infertility.   Oral Contraceptive use: 0 years   She has not used HRT in the past.    FAMILY HISTORY:  We obtained a detailed, 4-generation family history.  Significant diagnoses are listed below: Family History  Problem Relation Age of Onset  . Pneumonia Mother   . Heart attack Father   The patient has two neices who had breast  cancer.  One was diagnosed around age 15 and the other around age 36.  Patient's maternal ancestors are of [place] and [place] descent, and paternal ancestors are of [place] and [place] descent. There is no reported Ashkenazi Jewish ancestry. There is no known consanguinity.  GENETIC COUNSELING ASSESSMENT: Sabrina [name] is a 80 y.o. female with a personal and family history of breast cancer which somewhat suggestive of a hereditary cancer syndrome and predisposition to cancer. We, therefore, discussed and recommended the following at today's visit.   DISCUSSION: We reviewed the characteristics, features and inheritance patterns of hereditary cancer syndromes. We also discussed genetic  testing, including the appropriate family members to test, the process of testing, insurance coverage and turn-around-time for results.   PLAN: After considering the risks, benefits, and limitations, Willella L Ringold provided informed consent to pursue genetic testing and the blood sample will be sent to [facility] for analysis of the Breast/Ovarian Cancer Panel. We discussed the implications of a positive, negative and/ or variant of uncertain significance genetic test result. Results should be available within approximately 3 weeks' time, at which point they will be disclosed by telephone to [name], as will any additional recommendations warranted by these results. Keesha L Mcnellis will receive a summary of her genetic counseling visit and a copy of her results once available. This information will also be available in Epic. We encouraged [name] to remain in contact with cancer genetics annually so that we can continuously update the family history and inform her of any changes in cancer genetics and testing that may be of benefit for her family. [name]'s questions were answered to her satisfaction today. Our contact information was provided should additional questions or concerns arise.  The patient was seen for a total of 60 minutes, greater than 50% of which was spent face-to-face counseling.  This note will also be sent to the referring provider via the electronic medical record. The patient will be supplied with a summary of this genetic counseling discussion as well as educational information on the discussed hereditary cancer syndromes following the conclusion of their visit.   Patient was discussed with Dr. Marcy Panning.   _______________________________________________________________________ For Office Staff:  Number of people involved in session: 2 Was an Intern/ student involved with case: yes

## 2013-05-31 NOTE — Patient Instructions (Signed)
We discussed holding chemotherapy for now until you get stronger.  She will proceed with Herceptin  She will be seen back in 1 week for Herceptin only

## 2013-05-31 NOTE — Patient Instructions (Signed)
[facility] Discharge Instructions for Patients Receiving Chemotherapy  Today you received the following chemotherapy agents Herceptin.  To help prevent nausea and vomiting after your treatment, we encourage you to take your nausea medication as prescribed.   If you develop nausea and vomiting that is not controlled by your nausea medication, call the clinic.   BELOW ARE SYMPTOMS THAT SHOULD BE REPORTED IMMEDIATELY:  *FEVER GREATER THAN 100.5 F  *CHILLS WITH OR WITHOUT FEVER  NAUSEA AND VOMITING THAT IS NOT CONTROLLED WITH YOUR NAUSEA MEDICATION  *UNUSUAL SHORTNESS OF BREATH  *UNUSUAL BRUISING OR BLEEDING  TENDERNESS IN MOUTH AND THROAT WITH OR WITHOUT PRESENCE OF ULCERS  *URINARY PROBLEMS  *BOWEL PROBLEMS  UNUSUAL RASH Items with * indicate a potential emergency and should be followed up as soon as possible.  Feel free to call the clinic you have any questions or concerns. The clinic phone number is [phone].    

## 2013-05-31 NOTE — Telephone Encounter (Signed)
Per staff message and POF I have scheduled appts.  [name]  

## 2013-05-31 NOTE — Telephone Encounter (Signed)
, °

## 2013-06-02 NOTE — Progress Notes (Signed)
[facility] OFFICE PROGRESS NOTE  Patient Care Team: [name], MD as PCP - General (Internal Medicine) Dr. Thea Silversmith  Dr. Rolm Bookbinder  DIAGNOSIS: : 80 year old now with new diagnosis of T1 N1 right breast cancer   Breast cancer of upper-outer quadrant of right female breast  Primary site: Breast (Right)  Staging method: AJCC 7th Edition  Clinical: Stage IA (T1c, N0, cM0)  Summary: Stage IA (T1c, N0, cM0)   SUMMARY OF ONCOLOGIC HISTORY: #1screening mammogram patient was found to have a right breast mass measuring 1 cm. There were no abnormalities on her physical exam. Patient had ultrasound performed that showed 1.3 x 1.2 x 1.0 cm hypoechoic mass in the upper outer quadrant of the right breast. In the right axilla there was an abnormal appearing lymph node measuring 1.65 1.4 x 0.8 cm. Both her biopsy. The lymph node was positive for metastatic carcinoma. The primary breast tumor biopsy showed invasive ductal carcinoma, grade 2, ER positive PR positive HER-2/neu equivocal with a proliferation marker Ki-67 15%  #2 status post right lumpectomy with the final pathology revealing 1.4 cm invasive ductal carcinoma grade 3 ER positive PR positive repeat HER-2/neu testing revealed the HER-2 receptor to be positive. She did have history of margin that was positive. She is status post reexcision with Port-A-Cath placement by Dr. Rolm Bookbinder.  #3 adjuvant curative intent chemotherapy: Consisting of Taxol/Herceptin beginning 05/10/2013. She will receive treatment day 1, day 8, day 15, day 22 on a 28 day cycle for a total of 4 cycles. Patient understands risks benefits and side effects. She had an echocardiogram performed on 04/22/2013 with EF of 65-70%    CURRENT THERAPY: Patient will receive Herceptin only today ( Taxol held)   INTERVAL HISTORY: Sabrina [name] 80 y.o. female returns for followup visit today. Today she is scheduled to receive cycle 1 day 15. She is  feeling much better. Her chemotherapy consists of Herceptin and Taxol. She is I think having significant problems from the Taxol. I have recommended at least for the next 2 weeks holding the Taxol. She is feeling better she has no nausea no vomiting. She is ambulatory. She is still fatigued. Her drinking is not consistent at. I do think that she needs more IV fluids. I have discussed this with her and her husband. She does not need any nausea medicines. She does not have any diarrhea today.  I have reviewed the past medical history, past surgical history, social history and family history with the patient and they are unchanged from previous note.  ALLERGIES:  has No Known Allergies.  MEDICATIONS:  Current Outpatient Prescriptions  Medication Sig Dispense Refill  . atorvastatin (LIPITOR) 10 MG tablet Take 10 mg by mouth daily.      . Biotin 1000 MCG tablet Take 1,000 mcg by mouth daily.      . Calcium Citrate (CITRACAL PO) Take 1 tablet by mouth daily.       . Cholecalciferol (VITAMIN D-3) 1000 UNITS CAPS Take 1,000 Units by mouth daily.      . cloNIDine (CATAPRES) 0.2 MG tablet Take 0.2 mg by mouth 2 (two) times daily.      [name] dexamethasone (DECADRON) 4 MG tablet       . fluconazole (DIFLUCAN) 200 MG tablet Take 1 tablet (200 mg total) by mouth daily.  5 tablet  1  . furosemide (LASIX) 40 MG tablet Take 1 tablet (40 mg total) by mouth daily. Take extra tab as needed  for swelling  60 tablet  6  . HYDROcodone-acetaminophen (NORCO/VICODIN) 5-325 MG per tablet Take 1-2 tablets by mouth every 4 (four) hours as needed for moderate pain.  20 tablet  0  . IRON PO Take 65 mg by mouth daily.      [name] lidocaine-prilocaine (EMLA) cream Apply 1 - 2 hours to port -a-cath before being accessed.  30 g  6  . LORazepam (ATIVAN) 0.5 MG tablet Take 1 tablet (0.5 mg total) by mouth every 6 (six) hours as needed (Nausea or vomiting).  30 tablet  0  . metFORMIN (GLUCOPHAGE) 850 MG tablet Take 850 mg by mouth 2 (two)  times daily with a meal.      . Multiple Vitamins-Minerals (MULTIVITAMIN PO) Take 1 tablet by mouth daily.       [name] nystatin ointment (MYCOSTATIN)       . nystatin-triamcinolone (MYCOLOG II) cream       . ondansetron (ZOFRAN) 8 MG tablet       . potassium chloride 20 MEQ TBCR Take 20 mEq by mouth daily.  30 tablet  3  . potassium chloride SA (K-DUR,KLOR-CON) 20 MEQ tablet       . prochlorperazine (COMPAZINE) 10 MG tablet       . traMADol (ULTRAM) 50 MG tablet Take 50 mg by mouth 2 (two) times daily.      . valACYclovir (VALTREX) 500 MG tablet Take 1 tablet (500 mg total) by mouth daily. For 7 days  7 tablet  3  . verapamil (VERELAN PM) 240 MG 24 hr capsule Take 240 mg by mouth daily.        No current facility-administered medications for this visit.    REVIEW OF SYSTEMS:   Constitutional: Denies fevers, chills. she has had abnormal weight loss Eyes: Denies blurriness of vision Ears, nose, mouth, throat, and face: She has dried mucosa no thrush no mucositis  Respiratory: Denies cough, dyspnea or wheezes Cardiovascular: Denies palpitation, chest discomfort or lower extremity swelling Gastrointestinal:  Denies nausea, heartburn . No diarrhea Skin: Denies abnormal skin rashes Lymphatics: Denies new lymphadenopathy or easy bruising Neurological:Denies numbness, tingling or new weaknesses in her lower extremities, no syncopal episode Behavioral/Psych: Mood is stable, no new changes  All other systems were reviewed with the patient and are negative.  PHYSICAL EXAMINATION: ECOG PERFORMANCE STATUS: 2 - Symptomatic, <50% confined to bed  Filed Vitals:   05/31/13 1227  BP: 167/70  Pulse: 97  Temp: 98.2 F (36.8 C)  Resp: 18   Filed Weights   05/31/13 1227  Weight: 179 lb 9.6 oz (81.466 kg)    GENERAL:alert, no distress and comfortable SKIN: skin color, texture, turgor are normal, no rashes or significant lesions EYES: normal, Conjunctiva are pink and non-injected, sclera  clear OROPHARYNX:no exudate, no erythema and lips, buccal mucosa, and tongue normal  NECK: supple, thyroid normal size, non-tender, without nodularity LYMPH:  no palpable lymphadenopathy in the cervical, axillary or inguinal LUNGS: clear to auscultation and percussion with normal breathing effort HEART: regular rate & rhythm and no murmurs and no lower extremity edema ABDOMEN:abdomen soft, non-tender and normal bowel sounds Musculoskeletal:no cyanosis of digits and no clubbing  NEURO: alert & oriented x 3 with fluent speech, no focal motor/sensory deficits Breast examination: Left breast no masses or nipple discharge, right breast well-healed surgical scar. Port-A-Cath site looks normal without any infection  LABORATORY DATA:  I have reviewed the data as listed    Component Value Date/Time   NA 135*  05/31/2013 1212   K 4.4 05/31/2013 1212   CO2 24 05/31/2013 1212   GLUCOSE 302* 05/31/2013 1212   BUN 27.2* 05/31/2013 1212   CREATININE 1.7* 05/31/2013 1212   CALCIUM 8.8 05/31/2013 1212   PROT 6.4 05/31/2013 1212   ALBUMIN 3.1* 05/31/2013 1212   AST 14 05/31/2013 1212   ALT 19 05/31/2013 1212   ALKPHOS 92 05/31/2013 1212   BILITOT 0.41 05/31/2013 1212    No results found for this basename: SPEP,  UPEP,   kappa and lambda light chains    Lab Results  Component Value Date   WBC 8.1 05/31/2013   NEUTROABS 5.7 05/31/2013   HGB 9.8* 05/31/2013   HCT 29.9* 05/31/2013   MCV 91.0 05/31/2013   PLT 239 05/31/2013      Chemistry      Component Value Date/Time   NA 135* 05/31/2013 1212   K 4.4 05/31/2013 1212   CO2 24 05/31/2013 1212   BUN 27.2* 05/31/2013 1212   CREATININE 1.7* 05/31/2013 1212      Component Value Date/Time   CALCIUM 8.8 05/31/2013 1212   ALKPHOS 92 05/31/2013 1212   AST 14 05/31/2013 1212   ALT 19 05/31/2013 1212   BILITOT 0.41 05/31/2013 1212       RADIOGRAPHIC STUDIES: I have personally reviewed the radiological images as listed and agreed with the findings in the report. No  results found.    ASSESSMENT & PLAN:  80 year old female with  #1 stage II (T1 N1) invasive ductal carcinoma, grade 3, ER positive PR positive HER-2/neu positive. Patient is status post lumpectomy. She was then begun on adjuvant curative intent chemotherapy consisting of Taxol Herceptin beginning 05/10/2013. Thus far she is received cycle 1 days one and 8. She was here for day 15. Unfortunately she is having significant side effects from it. We will hold chemotherapy today.  #2 diarrhea: Significantly improved. She will take Imodium as needed  #3 dehydration: She still looks a little dehydrated. I will give her normal saline fluids today.  #4 anemia: Hemoglobin is a little bit lower than last week it is 9.8 today with a hematocrit of 29.9. She is asymptomatic we will continue to monitor. Hopefully by not giving her chemotherapy her counts will recover.  #5 renal insufficiency: Patient's BUN is improved since last week but it is still elevated creatinine is elevated at 1.7. I do think she still will be a good candidate for some IV fluids. Hopefully this will give her some more energy.  #6 followup: Patient will proceed with Herceptin today only. When she returns in one week I plan on giving her Herceptin only. Orders have been adjusted accordingly. We will still need to do a CBC liver function studies and electrolyte studies.  No orders of the defined types were placed in this encounter.   All questions were answered. The patient knows to call the clinic with any problems, questions or concerns. No barriers to learning was detected. I spent 20 minutes counseling the patient face to face. The total time spent in the appointment was 30 minutes and more than 50% was on counseling and review of test results and coordination of care     Marcy Panning, MD 06/02/2013 12:47 PM

## 2013-06-04 ENCOUNTER — Telehealth: Payer: Self-pay | Admitting: *Deleted

## 2013-06-04 NOTE — Telephone Encounter (Signed)
Mailed after appt letter to pt. 

## 2013-06-06 NOTE — Progress Notes (Signed)
Hematology and Oncology Follow Up Visit  TREVIA NOP [id number] 04-Jul-1926 80 y.o. 06/08/2013 3:10 PM     Principle Diagnosis:Sabrina [name] 80 y.o. female with stage IA triple positive invasive ductal carcinoma of the right breast.   Prior Therapy:  #1screening mammogram patient was found to have a right breast mass measuring 1 cm. There were no abnormalities on her physical exam. Patient had ultrasound performed that showed 1.3 x 1.2 x 1.0 cm hypoechoic mass in the upper outer quadrant of the right breast. In the right axilla there was an abnormal appearing lymph node measuring 1.65 1.4 x 0.8 cm. Both her biopsy. The lymph node was positive for metastatic carcinoma. The primary breast tumor biopsy showed invasive ductal carcinoma, grade 2, ER positive PR positive HER-2/neu equivocal with a proliferation marker Ki-67 15%   #2 status post right lumpectomy with the final pathology revealing 1.4 cm invasive ductal carcinoma grade 3 ER positive PR positive repeat HER-2/neu testing revealed the HER-2 receptor to be positive. She did have history of margin that was positive. She is status post reexcision with Port-A-Cath placement by Dr. Rolm Bookbinder.   #3 adjuvant curative intent chemotherapy: Consisting of Taxol/Herceptin beginning 05/10/2013. She will receive treatment day 1, day 8, day 15, day 22 on a 28 day cycle for a total of 4 cycles. Patient understands risks benefits and side effects. She had an echocardiogram performed on 04/22/2013 with EF of 65-70%    Current therapy: Taxol/Herceptin (on hold this week)  Interim History: Jasmen L Gruver 80 y.o. female with stage IA triple positive invasive ductal carcinoma of the right breast.  She is doing very poorly today.  She tells me that she received Herceptin only last week due to dehydration and nausea/diarrhea secondary to the Taxol.  She received IV fluids as well.  She had diarrhea on Tuesday only.  Then starting 3 days ago she began  experiencing nausea and vomiting.  She has not been able to keep down her PO medication either.  Her last bowel movement was on Saturday, 06/05/13.  She denies fevers, chills, abdominal pain and she reports passing gas.  She is increasingly hypertensive, approximately 220/80 on my recheck and denies headaches, slurred speech or unilateral weakness.  She does endorse feeling generalized weakness.    Medications:  Current Outpatient Prescriptions  Medication Sig Dispense Refill  . atorvastatin (LIPITOR) 10 MG tablet Take 10 mg by mouth daily.      . cloNIDine (CATAPRES) 0.2 MG tablet Take 0.2 mg by mouth 2 (two) times daily.      . furosemide (LASIX) 40 MG tablet Take 1 tablet (40 mg total) by mouth daily. Take extra tab as needed for swelling  60 tablet  6  . IRON PO Take 65 mg by mouth daily.      [name] lidocaine-prilocaine (EMLA) cream Apply 1 - 2 hours to port -a-cath before being accessed.  30 g  6  . LORazepam (ATIVAN) 0.5 MG tablet Take 1 tablet (0.5 mg total) by mouth every 6 (six) hours as needed (Nausea or vomiting).  30 tablet  0  . metFORMIN (GLUCOPHAGE) 850 MG tablet Take 850 mg by mouth 2 (two) times daily with a meal.      . nystatin-triamcinolone (MYCOLOG II) cream Apply 1 application topically 2 (two) times daily.       . prochlorperazine (COMPAZINE) 10 MG tablet       . verapamil (VERELAN PM) 240 MG 24 hr capsule Take 240  mg by mouth daily.       . cephALEXin (KEFLEX) 500 MG capsule Take 1 capsule (500 mg total) by mouth 4 (four) times daily.  28 capsule  0  . dexamethasone (DECADRON) 4 MG tablet Take 4 mg by mouth 2 (two) times daily. After chemo for 3 days      . HYDROcodone-acetaminophen (NORCO/VICODIN) 5-325 MG per tablet Take 1-2 tablets by mouth every 4 (four) hours as needed for moderate pain.  20 tablet  0  . nystatin ointment (MYCOSTATIN) Apply 1 application topically 2 (two) times daily as needed (skin).       . ondansetron (ZOFRAN) 4 MG tablet Take 1 tablet (4 mg total) by  mouth every 6 (six) hours.  12 tablet  0  . ondansetron (ZOFRAN) 8 MG tablet Take 8 mg by mouth every 8 (eight) hours as needed for nausea or vomiting.       . potassium chloride 20 MEQ TBCR Take 20 mEq by mouth daily.  30 tablet  3  . traMADol (ULTRAM) 50 MG tablet Take 50 mg by mouth 2 (two) times daily.       No current facility-administered medications for this visit.     Allergies: No Known Allergies  Medical History: Past Medical History  Diagnosis Date  . Breast cancer 03/2013    right  . Heart murmur     no known problems; states did not know she had murmur until age 37  . Immature cataract   . Arthritis     neck  . Wears partial dentures     lower  . Dental crowns present   . Hypertension     fluctuates, especially when stressed; has been on med. > 20 yr.  . Non-insulin dependent type 2 diabetes mellitus   . High cholesterol   . Chronic kidney disease (CKD), stage III (moderate)     nephrologist, Dr. [name]    Surgical History:  Past Surgical History  Procedure Laterality Date  . Tonsillectomy      as a child  . Breast surgery Right 11/1958    right breast biopsy benign  . Dilation and curettage of uterus    . Breast lumpectomy with needle localization Right 03/30/2013    Procedure: BREAST LUMPECTOMY WITH NEEDLE LOCALIZATION;  Surgeon: Rolm Bookbinder, MD;  Location: [place];  Service: General;  Laterality: Right;  . Axillary lymph node dissection Right 03/30/2013    Procedure: AXILLARY LYMPH NODE DISSECTION;  Surgeon: Rolm Bookbinder, MD;  Location: [place];  Service: General;  Laterality: Right;  . Re-excision of breast cancer,superior margins Right 04/15/2013    Procedure: RE-EXCISION OF RIGHT BREAST  MARGINS;  Surgeon: Rolm Bookbinder, MD;  Location: [place];  Service: General;  Laterality: Right;  . Portacath placement N/A 04/15/2013    Procedure: INSERTION PORT-A-CATH;  Surgeon: Rolm Bookbinder, MD;  Location: [place];  Service: General;  Laterality: N/A;     Review of Systems: A 10 point review of systems was conducted and is otherwise negative except for what is noted above.     Physical Exam: Blood pressure 232/90, pulse 111, temperature 97.8 F (36.6 C), temperature source Oral, height _0  (1.473 m), weight 171 lb 6.4 oz (77.747 kg). GENERAL: Patient is an acutely ill appearing elderly female sitting upright in wheelchair vomiting intermittently in emesis bag with vomit running down her shirt.   HEENT:  Unable to assess, patient actively vomiting LUNGS:  Clear to  auscultation bilaterally.  No wheezes or rhonchi. HEART:  Regular rhythm. No murmur appreciated. Slightly tachycardic ABDOMEN:  Soft, nontender.  Positive, normoactive bowel sounds. No organomegaly palpated. EXTREMITIES:  2+pedal edema.   SKIN:  Clear with no obvious rashes or skin changes. No nail dyscrasia. NEURO:  Nonfocal. Well oriented.  Appropriate affect. ECOG PERFORMANCE STATUS: 2 - Symptomatic, <50% confined to bed   Lab Results: Lab Results  Component Value Date   WBC 17.3* 06/07/2013   HGB 11.2* 06/07/2013   HCT 34.1* 06/07/2013   MCV 90.9 06/07/2013   PLT 272 06/07/2013     Chemistry      Component Value Date/Time   NA 140 06/07/2013 1215   NA 141 06/07/2013 0954   K 4.0 06/07/2013 1215   K 3.8 06/07/2013 0954   CL 92* 06/07/2013 1215   CO2 30 06/07/2013 1215   CO2 29 06/07/2013 0954   BUN 47* 06/07/2013 1215   BUN 48.5* 06/07/2013 0954   CREATININE 1.83* 06/07/2013 1215   CREATININE 2.1* 06/07/2013 0954      Component Value Date/Time   CALCIUM 9.8 06/07/2013 1215   CALCIUM 9.8 06/07/2013 0954   ALKPHOS 99 06/07/2013 1215   ALKPHOS 98 06/07/2013 0954   AST 15 06/07/2013 1215   AST 16 06/07/2013 0954   ALT 32 06/07/2013 1215   ALT 34 06/07/2013 0954   BILITOT 0.5 06/07/2013 1215   BILITOT 0.63 06/07/2013 0954     Assessment and Plan: Cornelia L Puller 80 y.o. female with  1. Stage IIA triple positive invasive ductal carcinoma of the  right breast.  The patient is s/p lumpectomy and was begun on adjuvant Taxol/Herceptin that began on 3/9.  Her taxol was held last week due to dehydration and she required IV fluids.  She is here to receive herceptin today.  Due to her acute illness and hypertensive crisis, we will hold her treatment today.  She needs to be evaluated in the emergency room.    2. Hypertensive Crisis.  Her blood pressure is 220/80 on my manual re-check of our nurse technician's manual BP.  She has not been able to keep down her PO medications due to her severe nausea and vomiting.  I recommended she proceed to the emergency room for this blood pressure.    3. Nausea, vomiting, and dehydration.  The patient would benefit from IV hydration and anti-emetics.    4. Cardiac. The patient underwent an echocardiogram on 04/22/13 that demonstrated a LVEF of 65-70%.  She underwent evaluation by Dr. [name] in the cardio-onc clinic.  She was cleared to continue Herceptin and was recommended 3 month follow up with a repeat echocardiogram.    The patient was sent to the emergency room today due to her increasingly elevated blood pressure.  I called the ER and spoke with the charge nurse Faith, RN and they are expecting the patient.  We will f/u with the patient next week, and if she is hospitalized we will follow along while she is inpatient if needed.    I spent 15 minutes counseling the patient face to face.  The total time spent in the appointment was 30 minutes.  Minette Headland, [place] [phone] 06/08/2013 3:10 PM

## 2013-06-07 ENCOUNTER — Other Ambulatory Visit: Payer: Self-pay

## 2013-06-07 ENCOUNTER — Ambulatory Visit (HOSPITAL_BASED_OUTPATIENT_CLINIC_OR_DEPARTMENT_OTHER): Payer: Medicare Other

## 2013-06-07 ENCOUNTER — Encounter (HOSPITAL_COMMUNITY): Payer: Self-pay | Admitting: Emergency Medicine

## 2013-06-07 ENCOUNTER — Encounter: Payer: Self-pay | Admitting: Adult Health

## 2013-06-07 ENCOUNTER — Emergency Department (HOSPITAL_COMMUNITY)
Admission: EM | Admit: 2013-06-07 | Discharge: 2013-06-07 | Disposition: A | Discharge status: Home / Self Care | Payer: Medicare Other | Attending: Emergency Medicine | Admitting: Emergency Medicine

## 2013-06-07 ENCOUNTER — Emergency Department (HOSPITAL_COMMUNITY): Payer: Medicare Other

## 2013-06-07 ENCOUNTER — Other Ambulatory Visit (HOSPITAL_BASED_OUTPATIENT_CLINIC_OR_DEPARTMENT_OTHER): Payer: Medicare Other

## 2013-06-07 ENCOUNTER — Ambulatory Visit (HOSPITAL_BASED_OUTPATIENT_CLINIC_OR_DEPARTMENT_OTHER): Payer: Medicare Other | Admitting: Adult Health

## 2013-06-07 ENCOUNTER — Ambulatory Visit: Payer: Medicare Other

## 2013-06-07 VITALS — BP 232/90 | HR 111 | Temp 97.8°F | Ht <= 58 in | Wt 171.4 lb

## 2013-06-07 DIAGNOSIS — C50419 Malignant neoplasm of upper-outer quadrant of unspecified female breast: Secondary | ICD-10-CM

## 2013-06-07 DIAGNOSIS — Z79899 Other long term (current) drug therapy: Secondary | ICD-10-CM | POA: Insufficient documentation

## 2013-06-07 DIAGNOSIS — N390 Urinary tract infection, site not specified: Secondary | ICD-10-CM

## 2013-06-07 DIAGNOSIS — Z95828 Presence of other vascular implants and grafts: Secondary | ICD-10-CM

## 2013-06-07 DIAGNOSIS — C50919 Malignant neoplasm of unspecified site of unspecified female breast: Secondary | ICD-10-CM

## 2013-06-07 DIAGNOSIS — I129 Hypertensive chronic kidney disease with stage 1 through stage 4 chronic kidney disease, or unspecified chronic kidney disease: Secondary | ICD-10-CM | POA: Insufficient documentation

## 2013-06-07 DIAGNOSIS — R112 Nausea with vomiting, unspecified: Secondary | ICD-10-CM | POA: Insufficient documentation

## 2013-06-07 DIAGNOSIS — N183 Chronic kidney disease, stage 3 unspecified: Secondary | ICD-10-CM | POA: Insufficient documentation

## 2013-06-07 DIAGNOSIS — E119 Type 2 diabetes mellitus without complications: Secondary | ICD-10-CM | POA: Insufficient documentation

## 2013-06-07 DIAGNOSIS — I10 Essential (primary) hypertension: Secondary | ICD-10-CM

## 2013-06-07 DIAGNOSIS — R609 Edema, unspecified: Secondary | ICD-10-CM | POA: Insufficient documentation

## 2013-06-07 DIAGNOSIS — E860 Dehydration: Secondary | ICD-10-CM

## 2013-06-07 DIAGNOSIS — Z87891 Personal history of nicotine dependence: Secondary | ICD-10-CM | POA: Insufficient documentation

## 2013-06-07 DIAGNOSIS — C50411 Malignant neoplasm of upper-outer quadrant of right female breast: Secondary | ICD-10-CM

## 2013-06-07 DIAGNOSIS — M129 Arthropathy, unspecified: Secondary | ICD-10-CM | POA: Insufficient documentation

## 2013-06-07 DIAGNOSIS — Z9221 Personal history of antineoplastic chemotherapy: Secondary | ICD-10-CM | POA: Insufficient documentation

## 2013-06-07 DIAGNOSIS — Z452 Encounter for adjustment and management of vascular access device: Secondary | ICD-10-CM

## 2013-06-07 DIAGNOSIS — Z170 Estrogen receptor positive status [ER+]: Secondary | ICD-10-CM

## 2013-06-07 DIAGNOSIS — E7800 Pure hypercholesterolemia, unspecified: Secondary | ICD-10-CM | POA: Insufficient documentation

## 2013-06-07 LAB — URINALYSIS, ROUTINE W REFLEX MICROSCOPIC
Bilirubin Urine: NEGATIVE
Glucose, UA: 250 mg/dL — AB
Ketones, ur: NEGATIVE mg/dL
Nitrite: NEGATIVE
Protein, ur: NEGATIVE mg/dL
Specific Gravity, Urine: 1.011 (ref 1.005–1.030)
Urobilinogen, UA: 0.2 mg/dL (ref 0.0–1.0)
pH: 7 (ref 5.0–8.0)

## 2013-06-07 LAB — COMPREHENSIVE METABOLIC PANEL (CC13)
ALT: 34 U/L (ref 0–55)
AST: 16 U/L (ref 5–34)
Albumin: 3.8 g/dL (ref 3.5–5.0)
Alkaline Phosphatase: 98 U/L (ref 40–150)
Anion Gap: 17 mEq/L — ABNORMAL HIGH (ref 3–11)
BUN: 48.5 mg/dL — ABNORMAL HIGH (ref 7.0–26.0)
CO2: 29 mEq/L (ref 22–29)
Calcium: 9.8 mg/dL (ref 8.4–10.4)
Chloride: 95 mEq/L — ABNORMAL LOW (ref 98–109)
Creatinine: 2.1 mg/dL — ABNORMAL HIGH (ref 0.6–1.1)
Glucose: 333 mg/dl — ABNORMAL HIGH (ref 70–140)
Potassium: 3.8 mEq/L (ref 3.5–5.1)
Sodium: 141 mEq/L (ref 136–145)
Total Bilirubin: 0.63 mg/dL (ref 0.20–1.20)
Total Protein: 7 g/dL (ref 6.4–8.3)

## 2013-06-07 LAB — COMPREHENSIVE METABOLIC PANEL
ALT: 32 U/L (ref 0–35)
AST: 15 U/L (ref 0–37)
Albumin: 3.7 g/dL (ref 3.5–5.2)
Alkaline Phosphatase: 99 U/L (ref 39–117)
BUN: 47 mg/dL — ABNORMAL HIGH (ref 6–23)
CO2: 30 mEq/L (ref 19–32)
Calcium: 9.8 mg/dL (ref 8.4–10.5)
Chloride: 92 mEq/L — ABNORMAL LOW (ref 96–112)
Creatinine, Ser: 1.83 mg/dL — ABNORMAL HIGH (ref 0.50–1.10)
GFR calc Af Amer: 28 mL/min — ABNORMAL LOW (ref >90)
GFR calc non Af Amer: 24 mL/min — ABNORMAL LOW (ref >90)
Glucose, Bld: 337 mg/dL — ABNORMAL HIGH (ref 70–99)
Potassium: 4 mEq/L (ref 3.7–5.3)
Sodium: 140 mEq/L (ref 137–147)
Total Bilirubin: 0.5 mg/dL (ref 0.3–1.2)
Total Protein: 7 g/dL (ref 6.0–8.3)

## 2013-06-07 LAB — CBC WITH DIFFERENTIAL/PLATELET
BASO%: 0.1 % (ref 0.0–2.0)
Basophils Absolute: 0 10*3/uL (ref 0.0–0.1)
Basophils Absolute: 0 10*3/uL (ref 0.0–0.1)
Basophils Relative: 0 % (ref 0–1)
EOS%: 0.3 % (ref 0.0–7.0)
Eosinophils Absolute: 0.1 10*3/uL (ref 0.0–0.5)
Eosinophils Absolute: 0 10*3/uL (ref 0.0–0.7)
Eosinophils Relative: 0 % (ref 0–5)
HCT: 34.9 % (ref 34.8–46.6)
HCT: 34.1 % — ABNORMAL LOW (ref 36.0–46.0)
HGB: 11.5 g/dL — ABNORMAL LOW (ref 11.6–15.9)
Hemoglobin: 11.2 g/dL — ABNORMAL LOW (ref 12.0–15.0)
LYMPH%: 18.3 % (ref 14.0–49.7)
Lymphocytes Relative: 13 % (ref 12–46)
Lymphs Abs: 2.2 10*3/uL (ref 0.7–4.0)
MCH: 30 pg (ref 25.1–34.0)
MCH: 29.9 pg (ref 26.0–34.0)
MCHC: 32.9 g/dL (ref 31.5–36.0)
MCHC: 32.8 g/dL (ref 30.0–36.0)
MCV: 91.1 fL (ref 79.5–101.0)
MCV: 90.9 fL (ref 78.0–100.0)
MONO#: 1.4 10*3/uL — ABNORMAL HIGH (ref 0.1–0.9)
MONO%: 7.2 % (ref 0.0–14.0)
Monocytes Absolute: 0.9 10*3/uL (ref 0.1–1.0)
Monocytes Relative: 5 % (ref 3–12)
NEUT#: 14.8 10*3/uL — ABNORMAL HIGH (ref 1.5–6.5)
NEUT%: 74.1 % (ref 38.4–76.8)
Neutro Abs: 14.2 10*3/uL — ABNORMAL HIGH (ref 1.7–7.7)
Neutrophils Relative %: 82 % — ABNORMAL HIGH (ref 43–77)
Platelets: 317 10*3/uL (ref 145–400)
Platelets: 272 10*3/uL (ref 150–400)
RBC: 3.83 10*6/uL (ref 3.70–5.45)
RBC: 3.75 MIL/uL — ABNORMAL LOW (ref 3.87–5.11)
RDW: 14.2 % (ref 11.2–14.5)
RDW: 14.3 % (ref 11.5–15.5)
WBC: 19.9 10*3/uL — ABNORMAL HIGH (ref 3.9–10.3)
WBC: 17.3 10*3/uL — ABNORMAL HIGH (ref 4.0–10.5)
lymph#: 3.6 10*3/uL — ABNORMAL HIGH (ref 0.9–3.3)

## 2013-06-07 LAB — URINE MICROSCOPIC-ADD ON

## 2013-06-07 LAB — PRO B NATRIURETIC PEPTIDE: Pro B Natriuretic peptide (BNP): 557.1 pg/mL — ABNORMAL HIGH (ref 0–450)

## 2013-06-07 LAB — LIPASE, BLOOD: Lipase: 91 U/L — ABNORMAL HIGH (ref 11–59)

## 2013-06-07 LAB — TROPONIN I: Troponin I: <0.3 ng/mL (ref <0.30)

## 2013-06-07 MED ORDER — VERAPAMIL HCL ER 240 MG PO TBCR: 240.0000 mg | EXTENDED_RELEASE_TABLET | Freq: Every day | ORAL | Status: DC

## 2013-06-07 MED ORDER — CEPHALEXIN 500 MG PO CAPS: 500.0000 mg | ORAL_CAPSULE | Freq: Four times a day (QID) | ORAL | Status: DC

## 2013-06-07 MED ORDER — VERAPAMIL HCL ER 120 MG PO TBCR
120.0000 mg | EXTENDED_RELEASE_TABLET | ORAL | Status: AC
  Administered 2013-06-07: 120 mg via ORAL
  Filled 2013-06-07: qty 1

## 2013-06-07 MED ORDER — HEPARIN SOD (PORK) LOCK FLUSH 100 UNIT/ML IV SOLN
500.0000 [IU] | Freq: Once | INTRAVENOUS | Status: AC
  Administered 2013-06-07: 500 [IU] via INTRAVENOUS
  Filled 2013-06-07: qty 5

## 2013-06-07 MED ORDER — ONDANSETRON HCL 4 MG/2ML IJ SOLN
4.0000 mg | Freq: Once | INTRAMUSCULAR | Status: AC
  Administered 2013-06-07: 4 mg via INTRAVENOUS
  Filled 2013-06-07: qty 2

## 2013-06-07 MED ORDER — SODIUM CHLORIDE 0.9 % IJ SOLN
10.0000 mL | INTRAMUSCULAR | Status: DC | PRN
  Administered 2013-06-07: 10 mL via INTRAVENOUS
  Filled 2013-06-07: qty 10

## 2013-06-07 MED ORDER — SODIUM CHLORIDE 0.9 % IV BOLUS (SEPSIS)
500.0000 mL | Freq: Once | INTRAVENOUS | Status: AC
  Administered 2013-06-07: 500 mL via INTRAVENOUS

## 2013-06-07 MED ORDER — SODIUM CHLORIDE 0.9 % IV SOLN
1000.0000 mL | INTRAVENOUS | Status: DC
  Administered 2013-06-07: 1000 mL via INTRAVENOUS

## 2013-06-07 MED ORDER — ONDANSETRON HCL 4 MG PO TABS: 4.0000 mg | ORAL_TABLET | Freq: Four times a day (QID) | ORAL | Status: DC

## 2013-06-07 NOTE — Discharge Instructions (Signed)
Urinary Tract Infection °A urinary tract infection (UTI) can occur any place along the urinary tract. The tract includes the kidneys, ureters, bladder, and urethra. A type of germ called bacteria often causes a UTI. UTIs are often helped with antibiotic medicine.  °HOME CARE  °· If given, take antibiotics as told by your doctor. Finish them even if you start to feel better. °· Drink enough fluids to keep your pee (urine) clear or pale yellow. °· Avoid tea, drinks with caffeine, and bubbly (carbonated) drinks. °· Pee often. Avoid holding your pee in for a long time. °· Pee before and after having sex (intercourse). °· Wipe from front to back after you poop (bowel movement) if you are a woman. Use each tissue only once. °GET HELP RIGHT AWAY IF:  °· You have back pain. °· You have lower belly (abdominal) pain. °· You have chills. °· You feel sick to your stomach (nauseous). °· You throw up (vomit). °· Your burning or discomfort with peeing does not go away. °· You have a fever. °· Your symptoms are not better in 3 days. °MAKE SURE YOU:  °· Understand these instructions. °· Will watch your condition. °· Will get help right away if you are not doing well or get worse. °Document Released: 08/07/2007 Document Revised: 11/13/2011 Document Reviewed: 09/19/2011 °Exit[employer]® Patient Information ©2014 [employer]. ° °

## 2013-06-07 NOTE — ED Notes (Signed)
Provided pt with sandwich and Sprite, per Dr, Carloyn Manner

## 2013-06-07 NOTE — Progress Notes (Signed)
Pt actively vomiting, Pt states she has been nauseated all week and started vomiting this morning after breakfast. States she had a banana and some milk for breakfast. States she has been dehydrated as well. Pt is seeing Charlestine Massed today. Pt was accessed via PAC, blood drawn with no problems. Patient has scheduled infusion today, pt left accessed until seen by Mendel Ryder.

## 2013-06-07 NOTE — ED Provider Notes (Addendum)
CSN: [id number]     Arrival date & time 06/07/13  1124 History   First MD Initiated Contact with Patient 06/07/13 1138     Chief Complaint  Patient presents with  . cancer pt, hypertensive   . Emesis  . Nausea   HPI Patient has a history of breast cancer. She started chemotherapy treatments approximately one month ago. Patient initially was having trouble with diarrhea from her treatments. Recently her chemotherapy regimen changed. Since her last treatment over the last week she's had intermittent episodes of nausea and vomiting every other day or so. The patient was scheduled to go to the cancer center today for another treatment. Patient started having trouble with nausea and vomiting again today. She has not had any pain. She denies any fevers. She denies specifically abdominal pain or chest pain. She's not had any trouble with her breathing. At the cancer center patient was noted to be hypertensive. She has been taking her medications but is not sure if she is keeping them down. She has noticed some leg swelling but that is not new for her. Past Medical History  Diagnosis Date  . Breast cancer 03/2013    right  . Heart murmur     no known problems; states did not know she had murmur until age 46  . Immature cataract   . Arthritis     neck  . Wears partial dentures     lower  . Dental crowns present   . Hypertension     fluctuates, especially when stressed; has been on med. > 20 yr.  . Non-insulin dependent type 2 diabetes mellitus   . High cholesterol   . Chronic kidney disease (CKD), stage III (moderate)     nephrologist, Dr. [name]   Past Surgical History  Procedure Laterality Date  . Tonsillectomy      as a child  . Breast surgery Right 11/1958    right breast biopsy benign  . Dilation and curettage of uterus    . Breast lumpectomy with needle localization Right 03/30/2013    Procedure: BREAST LUMPECTOMY WITH NEEDLE LOCALIZATION;  Surgeon: Rolm Bookbinder, MD;   Location: [place];  Service: General;  Laterality: Right;  . Axillary lymph node dissection Right 03/30/2013    Procedure: AXILLARY LYMPH NODE DISSECTION;  Surgeon: Rolm Bookbinder, MD;  Location: [place];  Service: General;  Laterality: Right;  . Re-excision of breast cancer,superior margins Right 04/15/2013    Procedure: RE-EXCISION OF RIGHT BREAST  MARGINS;  Surgeon: Rolm Bookbinder, MD;  Location: [place];  Service: General;  Laterality: Right;  . Portacath placement N/A 04/15/2013    Procedure: INSERTION PORT-A-CATH;  Surgeon: Rolm Bookbinder, MD;  Location: [place];  Service: General;  Laterality: N/A;   Family History  Problem Relation Age of Onset  . Pneumonia Mother   . Heart attack Father   . Breast cancer Other 49  . Breast cancer Other 50   History  Substance Use Topics  . Smoking status: Former Research scientist (life sciences)  . Smokeless tobacco: Never Used     Comment: quit smoking in 1965  . Alcohol Use: 0.0 oz/week     Comment: 2-3 glasses wine/week   OB History   Grav Para Term Preterm Abortions TAB SAB Ect Mult Living                 Review of Systems  Constitutional: Negative for fever.  Cardiovascular: Negative for chest pain.  Gastrointestinal: Negative for  abdominal pain.  All other systems reviewed and are negative.      Allergies  Review of patient's allergies indicates no known allergies.  Home Medications   Current Outpatient Rx  Name  Route  Sig  Dispense  Refill  . atorvastatin (LIPITOR) 10 MG tablet   Oral   Take 10 mg by mouth daily.         . cloNIDine (CATAPRES) 0.2 MG tablet   Oral   Take 0.2 mg by mouth 2 (two) times daily.         [name] dexamethasone (DECADRON) 4 MG tablet   Oral   Take 4 mg by mouth 2 (two) times daily. After chemo for 3 days         . furosemide (LASIX) 40 MG tablet   Oral   Take 1 tablet (40 mg total) by mouth daily. Take extra tab as needed for swelling   60 tablet   6   .  HYDROcodone-acetaminophen (NORCO/VICODIN) 5-325 MG per tablet   Oral   Take 1-2 tablets by mouth every 4 (four) hours as needed for moderate pain.   20 tablet   0   . IRON PO   Oral   Take 65 mg by mouth daily.         [name] lidocaine-prilocaine (EMLA) cream      Apply 1 - 2 hours to port -a-cath before being accessed.   30 g   6   . LORazepam (ATIVAN) 0.5 MG tablet   Oral   Take 1 tablet (0.5 mg total) by mouth every 6 (six) hours as needed (Nausea or vomiting).   30 tablet   0   . metFORMIN (GLUCOPHAGE) 850 MG tablet   Oral   Take 850 mg by mouth 2 (two) times daily with a meal.         . ondansetron (ZOFRAN) 8 MG tablet   Oral   Take 8 mg by mouth every 8 (eight) hours as needed for nausea or vomiting.          . potassium chloride 20 MEQ TBCR   Oral   Take 20 mEq by mouth daily.   30 tablet   3   . prochlorperazine (COMPAZINE) 10 MG tablet               . traMADol (ULTRAM) 50 MG tablet   Oral   Take 50 mg by mouth 2 (two) times daily.         . verapamil (VERELAN PM) 240 MG 24 hr capsule   Oral   Take 240 mg by mouth daily.          [name] nystatin ointment (MYCOSTATIN)   Topical   Apply 1 application topically 2 (two) times daily as needed (skin).          . nystatin-triamcinolone (MYCOLOG II) cream   Topical   Apply 1 application topically 2 (two) times daily.           BP 156/67  Pulse 74  Temp(Src) 97.9 F (36.6 C) (Oral)  Resp 16  SpO2 92% Physical Exam  Nursing note and vitals reviewed. Constitutional:  Nauseated, holding an emesis bag  HENT:  Head: Normocephalic and atraumatic.  Right Ear: External ear normal.  Left Ear: External ear normal.  Eyes: Conjunctivae are normal. Right eye exhibits no discharge. Left eye exhibits no discharge. No scleral icterus.  Neck: Neck supple. No tracheal deviation present.  Cardiovascular: Normal rate, regular rhythm  and intact distal pulses.   Pulmonary/Chest: Effort normal and breath sounds  normal. No stridor. No respiratory distress. She has no wheezes. She has no rales.  Abdominal: Soft. Bowel sounds are normal. She exhibits no distension. There is no tenderness. There is no rebound and no guarding.  Musculoskeletal: She exhibits edema. She exhibits no tenderness.  Pitting edema bilateral lower extremities  Neurological: She is alert. She has normal strength. No cranial nerve deficit (no facial droop, extraocular movements intact, no slurred speech) or sensory deficit. She exhibits normal muscle tone. She displays no seizure activity. Coordination normal.  Skin: Skin is warm and dry. No rash noted. She is not diaphoretic.  Psychiatric: She has a normal mood and affect.    ED Course  Procedures (including critical care time) Labs Review Labs Reviewed  CBC WITH DIFFERENTIAL - Abnormal; Notable for the following:    WBC 17.3 (*)    RBC 3.75 (*)    Hemoglobin 11.2 (*)    HCT 34.1 (*)    Neutrophils Relative % 82 (*)    Neutro Abs 14.2 (*)    All other components within normal limits  COMPREHENSIVE METABOLIC PANEL - Abnormal; Notable for the following:    Chloride 92 (*)    Glucose, Bld 337 (*)    BUN 47 (*)    Creatinine, Ser 1.83 (*)    GFR calc non Af Amer 24 (*)    GFR calc Af Amer 28 (*)    All other components within normal limits  LIPASE, BLOOD - Abnormal; Notable for the following:    Lipase 91 (*)    All other components within normal limits  URINALYSIS, ROUTINE W REFLEX MICROSCOPIC - Abnormal; Notable for the following:    APPearance CLOUDY (*)    Glucose, UA 250 (*)    Hgb urine dipstick TRACE (*)    Leukocytes, UA MODERATE (*)    All other components within normal limits  PRO B NATRIURETIC PEPTIDE - Abnormal; Notable for the following:    Pro B Natriuretic peptide (BNP) 557.1 (*)    All other components within normal limits  URINE MICROSCOPIC-ADD ON - Abnormal; Notable for the following:    Bacteria, UA MANY (*)    All other components within normal  limits  TROPONIN I   Imaging Review Dg Chest 2 View  06/07/2013   CLINICAL DATA:  Emesis, nausea.  EXAM: CHEST  2 VIEW  COMPARISON:  April 15, 2013.  FINDINGS: Cardiomediastinal silhouette appears normal. Left subclavian Port-A-Cath is noted with tip in expected position of the SVC. No acute pulmonary disease is noted. No pneumothorax or pleural effusion is noted. Mild degenerative changes of lower thoracic spine is noted.  IMPRESSION: No acute cardiopulmonary abnormality seen.   Electronically Signed   By: Sabino Dick M.D.   On: 06/07/2013 13:25   EKG Rate 96 Normal sinus rhythm Normal intervals Normal ST-T wave No prior EKG for comparison   1415  Pt is feeling better after fluids and zofran.  BP has also been decreasing.  Latest was in the [id number] systolic.   MDM   Final diagnoses:  None   No HTN emergency.  Likely a component of htn related to her vomiting and discomfort as well as not being able to keep down her medications.  Pt has improved and would like to go home.  Will try oral fluids and make sure she can keep down her oral daily HTN med.    UA with possible  UTI.  Will dc home with keflex and zofran.  Increase in lipase but not having any abdominal pain.  Doubt acute pancreatitis.  Kathalene Frames, MD 06/07/13 [place] Miner Koral, MD 06/07/13 [phone]

## 2013-06-07 NOTE — ED Notes (Signed)
Bed: [id number] Expected date:  Expected time:  Means of arrival:  Comments: Cancer center pt Significant n/v, has been unable to keep HTN meds down, now in hypertensive crisis. Systolic [id number]

## 2013-06-07 NOTE — ED Notes (Signed)
Pt from cancer center. Pt has been vomiting since 930AM this morning and was unable to take her blood pressure medications this morning. Pt's systolic blood pressure was >200 at the cancer center.

## 2013-06-07 NOTE — Patient Instructions (Signed)

## 2013-06-10 ENCOUNTER — Encounter (HOSPITAL_COMMUNITY): Payer: Self-pay | Admitting: Emergency Medicine

## 2013-06-10 ENCOUNTER — Emergency Department (HOSPITAL_COMMUNITY): Payer: Medicare Other

## 2013-06-10 ENCOUNTER — Inpatient Hospital Stay (HOSPITAL_COMMUNITY)
Admission: EM | Admit: 2013-06-10 | Discharge: 2013-06-19 | DRG: 175 | Disposition: A | Discharge status: Home Health | Payer: Medicare Other | Attending: Internal Medicine | Admitting: Internal Medicine

## 2013-06-10 ENCOUNTER — Telehealth: Payer: Self-pay | Admitting: *Deleted

## 2013-06-10 ENCOUNTER — Other Ambulatory Visit: Payer: Self-pay

## 2013-06-10 DIAGNOSIS — Z853 Personal history of malignant neoplasm of breast: Secondary | ICD-10-CM

## 2013-06-10 DIAGNOSIS — N183 Chronic kidney disease, stage 3 unspecified: Secondary | ICD-10-CM | POA: Diagnosis present

## 2013-06-10 DIAGNOSIS — J449 Chronic obstructive pulmonary disease, unspecified: Secondary | ICD-10-CM | POA: Diagnosis present

## 2013-06-10 DIAGNOSIS — D638 Anemia in other chronic diseases classified elsewhere: Secondary | ICD-10-CM | POA: Diagnosis present

## 2013-06-10 DIAGNOSIS — I350 Nonrheumatic aortic (valve) stenosis: Secondary | ICD-10-CM

## 2013-06-10 DIAGNOSIS — E785 Hyperlipidemia, unspecified: Secondary | ICD-10-CM | POA: Diagnosis present

## 2013-06-10 DIAGNOSIS — Z87891 Personal history of nicotine dependence: Secondary | ICD-10-CM

## 2013-06-10 DIAGNOSIS — J9600 Acute respiratory failure, unspecified whether with hypoxia or hypercapnia: Secondary | ICD-10-CM | POA: Diagnosis present

## 2013-06-10 DIAGNOSIS — N390 Urinary tract infection, site not specified: Secondary | ICD-10-CM | POA: Diagnosis present

## 2013-06-10 DIAGNOSIS — R600 Localized edema: Secondary | ICD-10-CM

## 2013-06-10 DIAGNOSIS — R112 Nausea with vomiting, unspecified: Secondary | ICD-10-CM

## 2013-06-10 DIAGNOSIS — Z8249 Family history of ischemic heart disease and other diseases of the circulatory system: Secondary | ICD-10-CM

## 2013-06-10 DIAGNOSIS — I2699 Other pulmonary embolism without acute cor pulmonale: Principal | ICD-10-CM | POA: Diagnosis present

## 2013-06-10 DIAGNOSIS — I519 Heart disease, unspecified: Secondary | ICD-10-CM | POA: Diagnosis present

## 2013-06-10 DIAGNOSIS — K8020 Calculus of gallbladder without cholecystitis without obstruction: Secondary | ICD-10-CM | POA: Diagnosis present

## 2013-06-10 DIAGNOSIS — D62 Acute posthemorrhagic anemia: Secondary | ICD-10-CM | POA: Diagnosis present

## 2013-06-10 DIAGNOSIS — Z86711 Personal history of pulmonary embolism: Secondary | ICD-10-CM | POA: Diagnosis present

## 2013-06-10 DIAGNOSIS — I82409 Acute embolism and thrombosis of unspecified deep veins of unspecified lower extremity: Secondary | ICD-10-CM | POA: Diagnosis present

## 2013-06-10 DIAGNOSIS — R1115 Cyclical vomiting syndrome unrelated to migraine: Secondary | ICD-10-CM | POA: Diagnosis present

## 2013-06-10 DIAGNOSIS — R5383 Other fatigue: Secondary | ICD-10-CM | POA: Diagnosis present

## 2013-06-10 DIAGNOSIS — Z6836 Body mass index (BMI) 36.0-36.9, adult: Secondary | ICD-10-CM

## 2013-06-10 DIAGNOSIS — C50411 Malignant neoplasm of upper-outer quadrant of right female breast: Secondary | ICD-10-CM

## 2013-06-10 DIAGNOSIS — I10 Essential (primary) hypertension: Secondary | ICD-10-CM | POA: Diagnosis present

## 2013-06-10 DIAGNOSIS — Z79899 Other long term (current) drug therapy: Secondary | ICD-10-CM

## 2013-06-10 DIAGNOSIS — E119 Type 2 diabetes mellitus without complications: Secondary | ICD-10-CM | POA: Diagnosis present

## 2013-06-10 DIAGNOSIS — R609 Edema, unspecified: Secondary | ICD-10-CM

## 2013-06-10 DIAGNOSIS — N179 Acute kidney failure, unspecified: Secondary | ICD-10-CM | POA: Diagnosis present

## 2013-06-10 DIAGNOSIS — J4489 Other specified chronic obstructive pulmonary disease: Secondary | ICD-10-CM | POA: Diagnosis present

## 2013-06-10 DIAGNOSIS — E860 Dehydration: Secondary | ICD-10-CM | POA: Diagnosis present

## 2013-06-10 DIAGNOSIS — I129 Hypertensive chronic kidney disease with stage 1 through stage 4 chronic kidney disease, or unspecified chronic kidney disease: Secondary | ICD-10-CM | POA: Diagnosis present

## 2013-06-10 DIAGNOSIS — J9601 Acute respiratory failure with hypoxia: Secondary | ICD-10-CM

## 2013-06-10 DIAGNOSIS — D72829 Elevated white blood cell count, unspecified: Secondary | ICD-10-CM | POA: Diagnosis present

## 2013-06-10 DIAGNOSIS — R5381 Other malaise: Secondary | ICD-10-CM | POA: Diagnosis present

## 2013-06-10 DIAGNOSIS — R0902 Hypoxemia: Secondary | ICD-10-CM

## 2013-06-10 DIAGNOSIS — C50419 Malignant neoplasm of upper-outer quadrant of unspecified female breast: Secondary | ICD-10-CM | POA: Diagnosis present

## 2013-06-10 DIAGNOSIS — D500 Iron deficiency anemia secondary to blood loss (chronic): Secondary | ICD-10-CM | POA: Diagnosis present

## 2013-06-10 LAB — URINALYSIS, ROUTINE W REFLEX MICROSCOPIC
Bilirubin Urine: NEGATIVE
Glucose, UA: 100 mg/dL — AB
Hgb urine dipstick: NEGATIVE
Ketones, ur: NEGATIVE mg/dL
Leukocytes, UA: NEGATIVE
Nitrite: NEGATIVE
Protein, ur: NEGATIVE mg/dL
Specific Gravity, Urine: 1.011 (ref 1.005–1.030)
Urobilinogen, UA: 0.2 mg/dL (ref 0.0–1.0)
pH: 7.5 (ref 5.0–8.0)

## 2013-06-10 LAB — CBC WITH DIFFERENTIAL/PLATELET
Basophils Absolute: 0 10*3/uL (ref 0.0–0.1)
Basophils Relative: 0 % (ref 0–1)
Eosinophils Absolute: 0.1 10*3/uL (ref 0.0–0.7)
Eosinophils Relative: 1 % (ref 0–5)
HCT: 32.5 % — ABNORMAL LOW (ref 36.0–46.0)
Hemoglobin: 10.6 g/dL — ABNORMAL LOW (ref 12.0–15.0)
Lymphocytes Relative: 14 % (ref 12–46)
Lymphs Abs: 2.1 10*3/uL (ref 0.7–4.0)
MCH: 29.7 pg (ref 26.0–34.0)
MCHC: 32.6 g/dL (ref 30.0–36.0)
MCV: 91 fL (ref 78.0–100.0)
Monocytes Absolute: 1.1 10*3/uL — ABNORMAL HIGH (ref 0.1–1.0)
Monocytes Relative: 7 % (ref 3–12)
Neutro Abs: 11.6 10*3/uL — ABNORMAL HIGH (ref 1.7–7.7)
Neutrophils Relative %: 78 % — ABNORMAL HIGH (ref 43–77)
Platelets: 209 10*3/uL (ref 150–400)
RBC: 3.57 MIL/uL — ABNORMAL LOW (ref 3.87–5.11)
RDW: 14.4 % (ref 11.5–15.5)
WBC: 14.8 10*3/uL — ABNORMAL HIGH (ref 4.0–10.5)

## 2013-06-10 LAB — COMPREHENSIVE METABOLIC PANEL
ALT: 25 U/L (ref 0–35)
AST: 14 U/L (ref 0–37)
Albumin: 3.4 g/dL — ABNORMAL LOW (ref 3.5–5.2)
Alkaline Phosphatase: 94 U/L (ref 39–117)
BUN: 37 mg/dL — ABNORMAL HIGH (ref 6–23)
CO2: 30 mEq/L (ref 19–32)
Calcium: 9.3 mg/dL (ref 8.4–10.5)
Chloride: 96 mEq/L (ref 96–112)
Creatinine, Ser: 1.51 mg/dL — ABNORMAL HIGH (ref 0.50–1.10)
GFR calc Af Amer: 35 mL/min — ABNORMAL LOW (ref >90)
GFR calc non Af Amer: 31 mL/min — ABNORMAL LOW (ref >90)
Glucose, Bld: 168 mg/dL — ABNORMAL HIGH (ref 70–99)
Potassium: 3.5 mEq/L — ABNORMAL LOW (ref 3.7–5.3)
Sodium: 141 mEq/L (ref 137–147)
Total Bilirubin: 0.5 mg/dL (ref 0.3–1.2)
Total Protein: 6.5 g/dL (ref 6.0–8.3)

## 2013-06-10 LAB — PROTIME-INR
INR: 1.24 (ref 0.00–1.49)
Prothrombin Time: 15.3 seconds — ABNORMAL HIGH (ref 11.6–15.2)

## 2013-06-10 LAB — I-STAT CG4 LACTIC ACID, ED: Lactic Acid, Venous: 1.58 mmol/L (ref 0.5–2.2)

## 2013-06-10 LAB — GLUCOSE, CAPILLARY: Glucose-Capillary: 252 mg/dL — ABNORMAL HIGH (ref 70–99)

## 2013-06-10 LAB — TROPONIN I: Troponin I: <0.3 ng/mL (ref <0.30)

## 2013-06-10 LAB — LIPASE, BLOOD: Lipase: 76 U/L — ABNORMAL HIGH (ref 11–59)

## 2013-06-10 LAB — APTT: aPTT: 23 seconds — ABNORMAL LOW (ref 24–37)

## 2013-06-10 MED ORDER — STERILE WATER FOR INJECTION IJ SOLN
INTRAMUSCULAR | Status: AC
  Administered 2013-06-10: 10 mL
  Filled 2013-06-10: qty 10

## 2013-06-10 MED ORDER — HYDROMORPHONE HCL PF 1 MG/ML IJ SOLN: 1.0000 mg | INTRAMUSCULAR | Status: DC | PRN

## 2013-06-10 MED ORDER — LORAZEPAM 0.5 MG PO TABS: 0.5000 mg | ORAL_TABLET | Freq: Four times a day (QID) | ORAL | Status: DC | PRN

## 2013-06-10 MED ORDER — ONDANSETRON HCL 4 MG PO TABS: 4.0000 mg | ORAL_TABLET | Freq: Four times a day (QID) | ORAL | Status: DC | PRN

## 2013-06-10 MED ORDER — SODIUM CHLORIDE 0.9 % IV BOLUS (SEPSIS)
1000.0000 mL | Freq: Once | INTRAVENOUS | Status: AC
  Administered 2013-06-10: 1000 mL via INTRAVENOUS

## 2013-06-10 MED ORDER — ONDANSETRON HCL 4 MG/2ML IJ SOLN: 4.0000 mg | Freq: Three times a day (TID) | INTRAMUSCULAR | Status: DC | PRN

## 2013-06-10 MED ORDER — LABETALOL HCL 5 MG/ML IV SOLN
10.0000 mg | INTRAVENOUS | Status: DC | PRN
  Filled 2013-06-10: qty 4

## 2013-06-10 MED ORDER — SODIUM CHLORIDE 0.9 % IJ SOLN
3.0000 mL | Freq: Two times a day (BID) | INTRAMUSCULAR | Status: DC
  Administered 2013-06-11 – 2013-06-12 (×3): 3 mL via INTRAVENOUS

## 2013-06-10 MED ORDER — VERAPAMIL HCL ER 240 MG PO TBCR
240.0000 mg | EXTENDED_RELEASE_TABLET | Freq: Every day | ORAL | Status: DC
  Administered 2013-06-11 – 2013-06-19 (×9): 240 mg via ORAL
  Filled 2013-06-10 (×9): qty 1

## 2013-06-10 MED ORDER — HEPARIN BOLUS VIA INFUSION
2000.0000 [IU] | Freq: Once | INTRAVENOUS | Status: AC
  Administered 2013-06-10: 2000 [IU] via INTRAVENOUS
  Filled 2013-06-10: qty 2000

## 2013-06-10 MED ORDER — TECHNETIUM TC 99M DIETHYLENETRIAME-PENTAACETIC ACID: 40.0000 | Freq: Once | INTRAVENOUS | Status: AC | PRN

## 2013-06-10 MED ORDER — HEPARIN (PORCINE) IN NACL 100-0.45 UNIT/ML-% IJ SOLN
1000.0000 [IU]/h | INTRAMUSCULAR | Status: DC
  Administered 2013-06-10 – 2013-06-11 (×2): 1000 [IU]/h via INTRAVENOUS
  Filled 2013-06-10 (×2): qty 250

## 2013-06-10 MED ORDER — HYDROCODONE-ACETAMINOPHEN 5-325 MG PO TABS: 1.0000 | ORAL_TABLET | ORAL | Status: DC | PRN

## 2013-06-10 MED ORDER — TECHNETIUM TO 99M ALBUMIN AGGREGATED
5.0000 | Freq: Once | INTRAVENOUS | Status: AC | PRN
  Administered 2013-06-10: 5 via INTRAVENOUS

## 2013-06-10 MED ORDER — POTASSIUM CHLORIDE ER 10 MEQ PO TBCR
20.0000 meq | EXTENDED_RELEASE_TABLET | Freq: Every day | ORAL | Status: DC
  Administered 2013-06-10 – 2013-06-19 (×9): 20 meq via ORAL
  Filled 2013-06-10 (×10): qty 2

## 2013-06-10 MED ORDER — ONDANSETRON HCL 4 MG/2ML IJ SOLN
4.0000 mg | Freq: Four times a day (QID) | INTRAMUSCULAR | Status: DC | PRN
  Administered 2013-06-10 – 2013-06-16 (×4): 4 mg via INTRAVENOUS
  Filled 2013-06-10 (×4): qty 2

## 2013-06-10 MED ORDER — LABETALOL HCL 5 MG/ML IV SOLN
10.0000 mg | Freq: Once | INTRAVENOUS | Status: AC
  Administered 2013-06-10: 10 mg via INTRAVENOUS
  Filled 2013-06-10: qty 4

## 2013-06-10 MED ORDER — PROCHLORPERAZINE MALEATE 10 MG PO TABS
10.0000 mg | ORAL_TABLET | Freq: Four times a day (QID) | ORAL | Status: DC | PRN
  Administered 2013-06-16: 10 mg via ORAL
  Filled 2013-06-10: qty 1

## 2013-06-10 MED ORDER — INSULIN ASPART 100 UNIT/ML ~~LOC~~ SOLN
0.0000 [IU] | Freq: Every day | SUBCUTANEOUS | Status: DC
  Administered 2013-06-10: 3 [IU] via SUBCUTANEOUS
  Administered 2013-06-11 – 2013-06-18 (×6): 2 [IU] via SUBCUTANEOUS

## 2013-06-10 MED ORDER — INSULIN ASPART 100 UNIT/ML ~~LOC~~ SOLN
0.0000 [IU] | Freq: Three times a day (TID) | SUBCUTANEOUS | Status: DC
  Administered 2013-06-11: 2 [IU] via SUBCUTANEOUS
  Administered 2013-06-11: 3 [IU] via SUBCUTANEOUS
  Administered 2013-06-12: 2 [IU] via SUBCUTANEOUS
  Administered 2013-06-12: 3 [IU] via SUBCUTANEOUS
  Administered 2013-06-12 – 2013-06-13 (×2): 2 [IU] via SUBCUTANEOUS
  Administered 2013-06-13: 3 [IU] via SUBCUTANEOUS
  Administered 2013-06-13: 2 [IU] via SUBCUTANEOUS
  Administered 2013-06-14: 3 [IU] via SUBCUTANEOUS
  Administered 2013-06-14: 2 [IU] via SUBCUTANEOUS
  Administered 2013-06-14: 1 [IU] via SUBCUTANEOUS
  Administered 2013-06-15 – 2013-06-16 (×6): 2 [IU] via SUBCUTANEOUS
  Administered 2013-06-17 (×2): 3 [IU] via SUBCUTANEOUS
  Administered 2013-06-17: 2 [IU] via SUBCUTANEOUS
  Administered 2013-06-18: 3 [IU] via SUBCUTANEOUS
  Administered 2013-06-18: 5 [IU] via SUBCUTANEOUS
  Administered 2013-06-18: 2 [IU] via SUBCUTANEOUS
  Administered 2013-06-19: 3 [IU] via SUBCUTANEOUS
  Administered 2013-06-19: 2 [IU] via SUBCUTANEOUS

## 2013-06-10 MED ORDER — ACETAMINOPHEN 325 MG PO TABS: 650.0000 mg | ORAL_TABLET | Freq: Four times a day (QID) | ORAL | Status: DC | PRN

## 2013-06-10 MED ORDER — ACETAMINOPHEN 650 MG RE SUPP: 650.0000 mg | Freq: Four times a day (QID) | RECTAL | Status: DC | PRN

## 2013-06-10 MED ORDER — ALUM & MAG HYDROXIDE-SIMETH 200-200-20 MG/5ML PO SUSP: 30.0000 mL | Freq: Four times a day (QID) | ORAL | Status: DC | PRN

## 2013-06-10 MED ORDER — ATORVASTATIN CALCIUM 10 MG PO TABS
10.0000 mg | ORAL_TABLET | Freq: Every day | ORAL | Status: DC
  Administered 2013-06-10 – 2013-06-11 (×2): 10 mg via ORAL
  Filled 2013-06-10 (×4): qty 1

## 2013-06-10 MED ORDER — CLONIDINE HCL 0.2 MG PO TABS
0.2000 mg | ORAL_TABLET | Freq: Two times a day (BID) | ORAL | Status: DC
  Administered 2013-06-10 – 2013-06-19 (×18): 0.2 mg via ORAL
  Filled 2013-06-10 (×19): qty 1

## 2013-06-10 MED ORDER — LABETALOL HCL 5 MG/ML IV SOLN: 10.0000 mg | INTRAVENOUS | Status: DC | PRN

## 2013-06-10 MED ORDER — ONDANSETRON HCL 4 MG/2ML IJ SOLN
4.0000 mg | Freq: Once | INTRAMUSCULAR | Status: AC
  Administered 2013-06-10: 4 mg via INTRAVENOUS
  Filled 2013-06-10: qty 2

## 2013-06-10 MED ORDER — TRAMADOL HCL 50 MG PO TABS
50.0000 mg | ORAL_TABLET | Freq: Two times a day (BID) | ORAL | Status: DC
  Administered 2013-06-10 – 2013-06-19 (×18): 50 mg via ORAL
  Filled 2013-06-10 (×18): qty 1

## 2013-06-10 NOTE — ED Notes (Signed)
Per EMS patient from home reports to ED for SOB and emesis, began tx for breast cancer one month ago, was seen in ED on Monday for the same and treated for UTI. EMS administered 4 mg zofran en route, without relief.

## 2013-06-10 NOTE — Telephone Encounter (Signed)
Received call from patient stating, "I'm having difficulty breathing. I think I might be dehydrated. I'm sitting in the chair and can't get up." Husband is at home with her. I told the patient to call 911. Patient wanted to see Dr. Humphrey Rolls. I informed the patient that Dr. Humphrey Rolls was out of the office this week. I stated, "you need medical attention now. 911 is the fastest route." Patient will get husband to call 911. Patient verbalized understanding.

## 2013-06-10 NOTE — Progress Notes (Signed)
ANTICOAGULATION CONSULT NOTE - Initial Consult  Pharmacy Consult for Heparin Indication: pulmonary embolus  No Known Allergies  Patient Measurements:   As of 06/07/13 Ht: 58in Wt: 77.7kg  Vital Signs: Temp: 98.2 F (36.8 C) (04/09 1427) Temp src: Oral (04/09 1427) BP: 201/66 mmHg (04/09 1427) Pulse Rate: 93 (04/09 1931)  Labs:  Recent Labs  06/10/13 1446 06/10/13 1550  HGB 10.6*  --   HCT 32.5*  --   PLT 209  --   CREATININE 1.51*  --   TROPONINI  --  <0.30    The CrCl is unknown because both a height and weight (above a minimum accepted value) are required for this calculation.   Medical History: Past Medical History  Diagnosis Date  . Heart murmur     no known problems; states did not know she had murmur until age 19  . Immature cataract   . Arthritis     neck  . Wears partial dentures     lower  . Dental crowns present   . Hypertension     fluctuates, especially when stressed; has been on med. > 20 yr.  . Non-insulin dependent type 2 diabetes mellitus   . High cholesterol   . Chronic kidney disease (CKD), stage III (moderate)     nephrologist, Dr. [name]  . Breast cancer 03/2013    right    Medications:  Scheduled:  . labetalol  10 mg Intravenous Once    Assessment: 80 yo female with breast cancer, on chemotherapy, presents to ED with shortness of breath, nausea and vomiting. VQ scan shows high probability for RLL PE. Pharmacy consulted to dose IV heparin.  Goal of Therapy:  Heparin level 0.3-0.7 units/ml Monitor platelets by anticoagulation protocol: Yes   Plan:   STAT pTT, PT/INR  Heparin 2000 units IV bolus, then  Heparin 1000 units/hr  Check heparin level 8hr after heparin starts  Daily heparin level, CBC   Peggyann Juba, PharmD, BCPS Pager: [phone] 06/10/2013,7:36 PM

## 2013-06-10 NOTE — ED Notes (Signed)
Hospitalist at bedside 

## 2013-06-10 NOTE — H&P (Signed)
History and Physical       Hospital Admission Note Date: 06/10/2013  Patient name: Sabrina [name] Medical record number: [id number] Date of birth: 06-10-1926 Age: 80 y.o. Gender: female  PCP: [name], MD    Chief Complaint:  Nausea and vomiting today with shortness of breath  HPI: Patient is 80 year old female with history of new diagnosis of T1 N1 right breast cancer , following Dr. Humphrey Rolls, status post lumpectomy, on the chemotherapies started in March 2015, chronic renal insufficiency, hypertension, diabetes mellitus presented to the ER for intractable nausea and vomiting since last night and unable to take her medications. She has noticed that she was feeling short of breath and noticed her lower extremity edema right worse than left in the last 1 week. Patient was seen on 4/6 for UTI and was started on Keflex. In ED, VQ scan was done due to hypoxia and shortness of breath, which showed high probability of 2 segmental right lower lobe pulmonary emboli. Hospitalist service was requested for admission.     Review of Systems:  Constitutional: Denies fever, chills, diaphoresis, poor appetite and fatigue.  HEENT: Denies photophobia, eye pain, redness, hearing loss, ear pain, congestion, sore throat, rhinorrhea, sneezing, mouth sores, trouble swallowing, neck pain, neck stiffness and tinnitus.   Respiratory: Please see history of present illness Cardiovascular: Denies chest pain, palpitations and leg swelling.  Gastrointestinal: See history of present illness. Patient reports that her nausea and vomiting has improved in ED now Genitourinary: Denies dysuria, urgency, frequency, hematuria, flank pain and difficulty urinating.  Musculoskeletal: Denies myalgias, back pain, joint swelling, arthralgias and gait problem.  Skin: Denies pallor, rash and wound.  Neurological: Denies dizziness, seizures, syncope, weakness, light-headedness,  numbness and headaches.  Hematological: Denies adenopathy. Easy bruising, personal or family bleeding history  Psychiatric/Behavioral: Denies suicidal ideation, mood changes, confusion, nervousness, sleep disturbance and agitation  Past Medical History: Past Medical History  Diagnosis Date  . Heart murmur     no known problems; states did not know she had murmur until age 42  . Immature cataract   . Arthritis     neck  . Wears partial dentures     lower  . Dental crowns present   . Hypertension     fluctuates, especially when stressed; has been on med. > 20 yr.  . Non-insulin dependent type 2 diabetes mellitus   . High cholesterol   . Chronic kidney disease (CKD), stage III (moderate)     nephrologist, Dr. [name]  . Breast cancer 03/2013    right   Past Surgical History  Procedure Laterality Date  . Tonsillectomy      as a child  . Breast surgery Right 11/1958    right breast biopsy benign  . Dilation and curettage of uterus    . Breast lumpectomy with needle localization Right 03/30/2013    Procedure: BREAST LUMPECTOMY WITH NEEDLE LOCALIZATION;  Surgeon: Rolm Bookbinder, MD;  Location: [facility];  Service: General;  Laterality: Right;  . Axillary lymph node dissection Right 03/30/2013    Procedure: AXILLARY LYMPH NODE DISSECTION;  Surgeon: Rolm Bookbinder, MD;  Location: [place];  Service: General;  Laterality: Right;  . Re-excision of breast cancer,superior margins Right 04/15/2013    Procedure: RE-EXCISION OF RIGHT BREAST  MARGINS;  Surgeon: Rolm Bookbinder, MD;  Location: [place];  Service: General;  Laterality: Right;  . Portacath placement N/A 04/15/2013    Procedure: INSERTION PORT-A-CATH;  Surgeon: Rolm Bookbinder, MD;  Location: [facility];  Service: General;  Laterality: N/A;    Medications: Prior to Admission medications   Medication Sig Start Date End Date Taking? Authorizing Provider  atorvastatin (LIPITOR) 10 MG  tablet Take 10 mg by mouth daily.   Yes Historical Provider, MD  cephALEXin (KEFLEX) 500 MG capsule Take 1 capsule (500 mg total) by mouth 4 (four) times daily. 06/07/13  Yes Kathalene Frames, MD  cloNIDine (CATAPRES) 0.2 MG tablet Take 0.2 mg by mouth 2 (two) times daily.   Yes Historical Provider, MD  dexamethasone (DECADRON) 4 MG tablet Take 4 mg by mouth 2 (two) times daily. After chemo for 3 days 04/15/13  Yes Historical Provider, MD  furosemide (LASIX) 40 MG tablet Take 1 tablet (40 mg total) by mouth daily. Take extra tab as needed for swelling 05/28/13  Yes Jolaine Artist, MD  HYDROcodone-acetaminophen (NORCO/VICODIN) 5-325 MG per tablet Take 1-2 tablets by mouth every 4 (four) hours as needed for moderate pain. 03/31/13  Yes Rolm Bookbinder, MD  IRON PO Take 65 mg by mouth daily.   Yes Historical Provider, MD  lidocaine-prilocaine (EMLA) cream Apply 1 - 2 hours to port -a-cath before being accessed. 05/03/13  Yes Deatra Robinson, MD  LORazepam (ATIVAN) 0.5 MG tablet Take 1 tablet (0.5 mg total) by mouth every 6 (six) hours as needed (Nausea or vomiting). 05/04/13  Yes Deatra Robinson, MD  metFORMIN (GLUCOPHAGE) 850 MG tablet Take 850 mg by mouth 2 (two) times daily with a meal.   Yes Historical Provider, MD  nystatin ointment (MYCOSTATIN) Apply 1 application topically 2 (two) times daily as needed (skin).  05/18/13  Yes Historical Provider, MD  nystatin-triamcinolone (MYCOLOG II) cream Apply 1 application topically 2 (two) times daily.  05/27/13  Yes Historical Provider, MD  ondansetron (ZOFRAN) 4 MG tablet Take 1 tablet (4 mg total) by mouth every 6 (six) hours. 06/07/13  Yes Kathalene Frames, MD  ondansetron (ZOFRAN) 8 MG tablet Take 8 mg by mouth every 8 (eight) hours as needed for nausea or vomiting.  04/15/13  Yes Historical Provider, MD  potassium chloride 20 MEQ TBCR Take 20 mEq by mouth daily. 04/22/13  Yes Jolaine Artist, MD  prochlorperazine (COMPAZINE) 10 MG tablet Take 10 mg by mouth every 6 (six)  hours as needed for nausea or vomiting.  04/15/13  Yes Historical Provider, MD  traMADol (ULTRAM) 50 MG tablet Take 50 mg by mouth 2 (two) times daily.   Yes Historical Provider, MD  verapamil (VERELAN PM) 240 MG 24 hr capsule Take 240 mg by mouth daily.    Yes Historical Provider, MD    Allergies:  No Known Allergies  Social History:  reports that she has quit smoking. She has never used smokeless tobacco. She reports that she drinks alcohol. She reports that she does not use illicit drugs.  Family History: Family History  Problem Relation Age of Onset  . Pneumonia Mother   . Heart attack Father   . Breast cancer Other 49  . Breast cancer Other 50    Physical Exam: Blood pressure 186/65, pulse 87, temperature 98.2 F (36.8 C), temperature source Oral, resp. rate 16, SpO2 98.00%. General: Alert, awake, oriented x3, in no acute distress. HEENT: normocephalic, atraumatic, anicteric sclera, pink conjunctiva, pupils equal and reactive to light and accomodation, oropharynx clear Neck: supple, no masses or lymphadenopathy, no goiter, no bruits  Heart: Regular rate and rhythm, without murmurs, rubs or gallops. Lungs: Clear to auscultation bilaterally,  no wheezing, rales or rhonchi. Abdomen: Soft, nontender, nondistended, positive bowel sounds, no masses. Extremities: No clubbing, cyanosis, bilateral edema right worse than left. Neuro: Grossly intact, no focal neurological deficits, strength 5/5 upper and lower extremities bilaterally Psych: alert and oriented x 3, normal mood and affect Skin: no rashes or lesions, warm and dry   LABS on Admission:  Basic Metabolic Panel:  Recent Labs Lab 06/07/13 1215 06/10/13 1446  NA 140 141  K 4.0 3.5*  CL 92* 96  CO2 30 30  GLUCOSE 337* 168*  BUN 47* 37*  CREATININE 1.83* 1.51*  CALCIUM 9.8 9.3   Liver Function Tests:  Recent Labs Lab 06/07/13 1215 06/10/13 1446  AST 15 14  ALT 32 25  ALKPHOS 99 94  BILITOT 0.5 0.5  PROT 7.0  6.5  ALBUMIN 3.7 3.4*    Recent Labs Lab 06/07/13 1215 06/10/13 1446  LIPASE 91* 76*   No results found for this basename: AMMONIA,  in the last 168 hours CBC:  Recent Labs Lab 06/07/13 1215 06/10/13 1446  WBC 17.3* 14.8*  NEUTROABS 14.2* 11.6*  HGB 11.2* 10.6*  HCT 34.1* 32.5*  MCV 90.9 91.0  PLT 272 209   Cardiac Enzymes:  Recent Labs Lab 06/07/13 1215 06/10/13 1550  TROPONINI <0.30 <0.30   BNP: No components found with this basename: POCBNP,  CBG: No results found for this basename: GLUCAP,  in the last 168 hours   Radiological Exams on Admission: Dg Chest 2 View  06/07/2013   CLINICAL DATA:  Emesis, nausea.  EXAM: CHEST  2 VIEW  COMPARISON:  April 15, 2013.  FINDINGS: Cardiomediastinal silhouette appears normal. Left subclavian Port-A-Cath is noted with tip in expected position of the SVC. No acute pulmonary disease is noted. No pneumothorax or pleural effusion is noted. Mild degenerative changes of lower thoracic spine is noted.  IMPRESSION: No acute cardiopulmonary abnormality seen.   Electronically Signed   By: Sabino Dick M.D.   On: 06/07/2013 13:25   Nm Pulmonary Perf And Vent  06/10/2013   CLINICAL DATA:  Hypoxia; history of malignancy  EXAM: NUCLEAR MEDICINE VENTILATION - PERFUSION LUNG SCAN  Views: Anterior, posterior, left lateral, right lateral, RPO, LPO, RAO, LAO -ventilation and perfusion  Radionuclide: Technetium 66m DTPA -ventilation; Technetium 57m macroaggregated albumin-perfusion  Dose: 40 0.0 mCi -ventilation; 5.0 mCi-perfusion  Route of administration: Inhalation -ventilation; intravenous-perfusion  COMPARISON:  Chest radiograph June 10, 2013  FINDINGS: Radiotracer uptake on the ventilation study is homogeneous and symmetric bilaterally.  On the perfusion study, there are segmental defects in the lateral and posterior segments of the right lower lobe. Elsewhere, radiotracer uptake is normal on the perfusion study.  IMPRESSION: There are two  perfusion defects without corresponding ventilation defects. The chest radiograph does not show infiltrate in the right base. These findings constitute an overall high probability of pulmonary embolus.  Critical Value/emergent results were called by telephone at the time of interpretation on 06/10/2013 at 7:05 PM to Dr. Pryor Curia , who verbally acknowledged these results.   Electronically Signed   By: Lowella Grip M.D.   On: 06/10/2013 19:08   Dg Abd Acute W/chest  06/10/2013   CLINICAL DATA:  Short of breath, vomiting, on chemotherapy for breast carcinoma  EXAM: ACUTE ABDOMEN SERIES (ABDOMEN 2 VIEW & CHEST 1 VIEW)  COMPARISON:  Chest x-ray of 06/09/2013  FINDINGS: No active infiltrate or effusion is seen. A Port-A-Cath is noted from the left with the tip overlying the mid SVC. The  heart is within normal limits in size.  Supine and left lateral decubitus films of the abdomen show a large body habitus. However no bowel obstruction is seen and air is noted to the rectum. No free air is seen in the erect view. There are rounded calcifications in the right upper quadrant consistent with gallstones.  IMPRESSION: 1. No active lung disease. 2. No bowel obstruction or free air. 3. Gallstones in the right upper quadrant   Electronically Signed   By: Ivar Drape M.D.   On: 06/10/2013 16:40    Assessment/Plan Principal Problem:   Pulmonary embolism - Will admit to telemetry, she is currently hemodynamically stable, O2 sats 98% on 2 L - Heparin drip per pharmacy consult, obtain lower extremity venous Dopplers to rule out DVT, 2-D echocardiogram - Patient follows Dr. Humphrey Rolls for CA breast, currently on chemotherapy please consult in a.m. defer permanent anticoagulation to oncology in light of malignancy. ? IVC filter  Active Problems:   Breast cancer of upper-outer quadrant of right female breast - Consult oncology in am, I have added Dr. Humphrey Rolls in the treatment team    Edema leg -Obtain lower extremity duplex  to rule out DVT     Nausea and vomiting: No abdominal pain  -Currently improving, continue IV Zofran, no UTI at present on UA - Abdominal KUB shows no bowel obstruction or free air, gallstones in right upper quadrant. LFTs are normal    Accelerated hypertension - Likely worsened due to inability for taking antihypertensives orally. BP was in 200s at the time of presentation now improved to 186/65, placed on on labetalol as needed and oral antihypertensives    DVT prophylaxis:  heparin drip   CODE STATUS:  Full code  discussed with the patient   Family Communication: Admission, patients condition and plan of care including tests being ordered have been discussed with the patient and  husband who indicates understanding and agree with the plan and Code Status   Further plan will depend as patient's clinical course evolves and further radiologic and laboratory data become available.   Time Spent on Admission: 1 hour  Colson Barco Krystal Eaton M.D. [facility] 06/10/2013, 7:53 PM Pager: [phone]  If 7PM-7AM, please contact night-coverage [email or web address] Password [email or web address]

## 2013-06-10 NOTE — ED Notes (Signed)
Coag labs drawn prior to start of Heparin Heparin infusing per orders, see MAR Labetalol given, see MAR BP cycling Patient repositioned in bed and denies further needs at this time Side rails up, call bell in reach  Awaiting bed placement

## 2013-06-10 NOTE — ED Provider Notes (Signed)
TIME SEEN: 3:22 PM  CHIEF COMPLAINT: Vomiting, shortness of breath  HPI: Patient is a 80 year old female who currently is receiving chemotherapy for breast cancer, last chemotherapy was on April 6, history of hypertension, non-insulin-dependent diabetes, hyperlipidemia, COPD who presents to the emergency department with complaints of nausea and vomiting that started last night. She's also been feeling short of breath. No chest pain or chest discomfort. No diarrhea. No fever or chills. No dysuria or hematuria. No headache. She was seen here on 06/07/13 and diagnosed with a urinary tract infection for which she is currently taking antibiotics. In the emergency department, patient is mildly hypoxic with a room air sat of 88%. She doesn't wear oxygen at home. No history of COPD or asthma. No history of any significant tobacco use.  ROS: See HPI Constitutional: no fever  Eyes: no drainage  ENT: no runny nose   Cardiovascular:  no chest pain  Resp: SOB  GI: vomiting GU: no dysuria Integumentary: no rash  Allergy: no hives  Musculoskeletal: no leg swelling  Neurological: no slurred speech ROS otherwise negative  PAST MEDICAL HISTORY/PAST SURGICAL HISTORY:  Past Medical History  Diagnosis Date  . Heart murmur     no known problems; states did not know she had murmur until age 58  . Immature cataract   . Arthritis     neck  . Wears partial dentures     lower  . Dental crowns present   . Hypertension     fluctuates, especially when stressed; has been on med. > 20 yr.  . Non-insulin dependent type 2 diabetes mellitus   . High cholesterol   . Chronic kidney disease (CKD), stage III (moderate)     nephrologist, Dr. [name]  . Breast cancer 03/2013    right    MEDICATIONS:  Prior to Admission medications   Medication Sig Start Date End Date Taking? Authorizing Provider  atorvastatin (LIPITOR) 10 MG tablet Take 10 mg by mouth daily.   Yes Historical Provider, MD  cephALEXin  (KEFLEX) 500 MG capsule Take 1 capsule (500 mg total) by mouth 4 (four) times daily. 06/07/13  Yes Kathalene Frames, MD  cloNIDine (CATAPRES) 0.2 MG tablet Take 0.2 mg by mouth 2 (two) times daily.   Yes Historical Provider, MD  dexamethasone (DECADRON) 4 MG tablet Take 4 mg by mouth 2 (two) times daily. After chemo for 3 days 04/15/13  Yes Historical Provider, MD  furosemide (LASIX) 40 MG tablet Take 1 tablet (40 mg total) by mouth daily. Take extra tab as needed for swelling 05/28/13  Yes Jolaine Artist, MD  HYDROcodone-acetaminophen (NORCO/VICODIN) 5-325 MG per tablet Take 1-2 tablets by mouth every 4 (four) hours as needed for moderate pain. 03/31/13  Yes Rolm Bookbinder, MD  IRON PO Take 65 mg by mouth daily.   Yes Historical Provider, MD  lidocaine-prilocaine (EMLA) cream Apply 1 - 2 hours to port -a-cath before being accessed. 05/03/13  Yes Deatra Robinson, MD  LORazepam (ATIVAN) 0.5 MG tablet Take 1 tablet (0.5 mg total) by mouth every 6 (six) hours as needed (Nausea or vomiting). 05/04/13  Yes Deatra Robinson, MD  metFORMIN (GLUCOPHAGE) 850 MG tablet Take 850 mg by mouth 2 (two) times daily with a meal.   Yes Historical Provider, MD  nystatin ointment (MYCOSTATIN) Apply 1 application topically 2 (two) times daily as needed (skin).  05/18/13  Yes Historical Provider, MD  nystatin-triamcinolone (MYCOLOG II) cream Apply 1 application topically 2 (two)  times daily.  05/27/13  Yes Historical Provider, MD  ondansetron (ZOFRAN) 4 MG tablet Take 1 tablet (4 mg total) by mouth every 6 (six) hours. 06/07/13  Yes Kathalene Frames, MD  ondansetron (ZOFRAN) 8 MG tablet Take 8 mg by mouth every 8 (eight) hours as needed for nausea or vomiting.  04/15/13  Yes Historical Provider, MD  potassium chloride 20 MEQ TBCR Take 20 mEq by mouth daily. 04/22/13  Yes Jolaine Artist, MD  prochlorperazine (COMPAZINE) 10 MG tablet Take 10 mg by mouth every 6 (six) hours as needed for nausea or vomiting.  04/15/13  Yes Historical Provider,  MD  traMADol (ULTRAM) 50 MG tablet Take 50 mg by mouth 2 (two) times daily.   Yes Historical Provider, MD  verapamil (VERELAN PM) 240 MG 24 hr capsule Take 240 mg by mouth daily.    Yes Historical Provider, MD    ALLERGIES:  No Known Allergies  SOCIAL HISTORY:  History  Substance Use Topics  . Smoking status: Former Research scientist (life sciences)  . Smokeless tobacco: Never Used     Comment: quit smoking in 1965  . Alcohol Use: 0.0 oz/week     Comment: 2-3 glasses wine/week    FAMILY HISTORY: Family History  Problem Relation Age of Onset  . Pneumonia Mother   . Heart attack Father   . Breast cancer Other 49  . Breast cancer Other 50    EXAM: BP 201/66  Pulse 99  Temp(Src) 98.2 F (36.8 C) (Oral)  Resp 18  SpO2 96% CONSTITUTIONAL: Alert and oriented and responds appropriately to questions. Appears uncomfortable but nontoxic, patient is actively vomiting in the emergency department HEAD: Normocephalic EYES: Conjunctivae clear, PERRL ENT: normal nose; no rhinorrhea; moist mucous membranes; pharynx without lesions noted NECK: Supple, no meningismus, no LAD  CARD: RRR; S1 and S2 appreciated; no murmurs, no clicks, no rubs, no gallops RESP: Normal chest excursion without splinting or tachypnea; breath sounds clear and equal bilaterally; no wheezes, no rhonchi, no rales,  ABD/GI: Normal bowel sounds; non-distended; soft, non-tender, no rebound, no guarding BACK:  The back appears normal and is non-tender to palpation, there is no CVA tenderness EXT: Normal ROM in all joints; non-tender to palpation; no edema; normal capillary refill; no cyanosis    SKIN: Normal color for age and race; warm NEURO: Moves all extremities equally PSYCH: The patient's mood and manner are appropriate. Grooming and personal hygiene are appropriate.  MEDICAL DECISION MAKING: Patient here with vomiting shortness of breath. She is hypoxic on room air. She has clear breath sounds bilaterally.  Will check abdominal labs,  cardiac labs, abdominal series. Images clear breath sounds with no obvious source of her hypoxia she is a cancer patient, discussed with patient I am concerned for pulmonary embolus. She has a history of chronic kidney disease. She will need VQ scan today.  ED PROGRESS: Patient's VQ scan shows high probability 2 segmental right lower lobe pulmonary emboli. Given her GFR is 35, will start heparin drip. Given her new oxygen requirement, will admit. Will discuss with hospitalist.   7:45 PM  Pt's oxygen saturation is 100% on 2 L. Her blood pressure is 186/85. Discussed with hospitalist for admission to telemetry.      Date: 06/10/2013 14:20  Rate: 97  hythm: normal sinus rhythm  QRS Axis: normal  Intervals: normal  ST/T Wave abnormalities: normal  Conduction Disutrbances: none  Narrative Interpretation: unremarkable       [place], DO 06/10/13 1945

## 2013-06-10 NOTE — ED Notes (Signed)
Bed: [id number] Expected date:  Expected time:  Means of arrival:  Comments: ems 80 yo F, sob, n/v, breast cancer treatment

## 2013-06-10 NOTE — ED Notes (Signed)
Pt. Unable to use the restroom at this time, but is aware that we need a urine specimen.

## 2013-06-11 DIAGNOSIS — R609 Edema, unspecified: Secondary | ICD-10-CM

## 2013-06-11 DIAGNOSIS — I2699 Other pulmonary embolism without acute cor pulmonale: Principal | ICD-10-CM

## 2013-06-11 DIAGNOSIS — I82409 Acute embolism and thrombosis of unspecified deep veins of unspecified lower extremity: Secondary | ICD-10-CM

## 2013-06-11 DIAGNOSIS — C50419 Malignant neoplasm of upper-outer quadrant of unspecified female breast: Secondary | ICD-10-CM

## 2013-06-11 DIAGNOSIS — R112 Nausea with vomiting, unspecified: Secondary | ICD-10-CM

## 2013-06-11 LAB — BASIC METABOLIC PANEL
BUN: 29 mg/dL — ABNORMAL HIGH (ref 6–23)
CO2: 31 mEq/L (ref 19–32)
Calcium: 8.4 mg/dL (ref 8.4–10.5)
Chloride: 99 mEq/L (ref 96–112)
Creatinine, Ser: 1.5 mg/dL — ABNORMAL HIGH (ref 0.50–1.10)
GFR calc Af Amer: 36 mL/min — ABNORMAL LOW (ref >90)
GFR calc non Af Amer: 31 mL/min — ABNORMAL LOW (ref >90)
Glucose, Bld: 198 mg/dL — ABNORMAL HIGH (ref 70–99)
Potassium: 4 mEq/L (ref 3.7–5.3)
Sodium: 142 mEq/L (ref 137–147)

## 2013-06-11 LAB — CBC
HCT: 29.6 % — ABNORMAL LOW (ref 36.0–46.0)
Hemoglobin: 9.8 g/dL — ABNORMAL LOW (ref 12.0–15.0)
MCH: 30.1 pg (ref 26.0–34.0)
MCHC: 33.1 g/dL (ref 30.0–36.0)
MCV: 90.8 fL (ref 78.0–100.0)
Platelets: 210 10*3/uL (ref 150–400)
RBC: 3.26 MIL/uL — ABNORMAL LOW (ref 3.87–5.11)
RDW: 14.6 % (ref 11.5–15.5)
WBC: 12.6 10*3/uL — ABNORMAL HIGH (ref 4.0–10.5)

## 2013-06-11 LAB — GLUCOSE, CAPILLARY
Glucose-Capillary: 165 mg/dL — ABNORMAL HIGH (ref 70–99)
Glucose-Capillary: 198 mg/dL — ABNORMAL HIGH (ref 70–99)
Glucose-Capillary: 245 mg/dL — ABNORMAL HIGH (ref 70–99)
Glucose-Capillary: 201 mg/dL — ABNORMAL HIGH (ref 70–99)

## 2013-06-11 LAB — HEPARIN LEVEL (UNFRACTIONATED)
Heparin Unfractionated: 0.63 IU/mL (ref 0.30–0.70)
Heparin Unfractionated: 0.69 IU/mL (ref 0.30–0.70)

## 2013-06-11 LAB — HEMOGLOBIN A1C
Hgb A1c MFr Bld: 8.5 % — ABNORMAL HIGH (ref <5.7)
Mean Plasma Glucose: 197 mg/dL — ABNORMAL HIGH (ref <117)

## 2013-06-11 MED ORDER — BOOST PLUS PO LIQD
237.0000 mL | ORAL | Status: DC
  Administered 2013-06-11 – 2013-06-12 (×2): 237 mL via ORAL
  Filled 2013-06-11 (×7): qty 237

## 2013-06-11 MED ORDER — GLUCERNA SHAKE PO LIQD
237.0000 mL | ORAL | Status: DC
  Administered 2013-06-11: 237 mL via ORAL
  Filled 2013-06-11: qty 237

## 2013-06-11 NOTE — Progress Notes (Signed)
PROGRESS NOTE  Sabrina [name] [id number] DOB: 12/21/1926 DOA: 06/10/2013 PCP: [name], MD  Assessment/Plan: Pulmonary embolism - on heparin gtt, stable on nasal cannula. LE [place] positive for DVT. Hematology consulted, appreciate input. Given renal failure breast cancer, will start Coumadin, likely tomorrow, and will need overlap with heparin gtt.  Breast cancer of upper-outer quadrant of right female breast - she is being followed by Dr. Humphrey Rolls and will follow up as an outpatient.  Nausea and vomiting: No abdominal pain -Currently improving, continue IV Zofran, no UTI at present on UA  - Abdominal KUB shows no bowel obstruction or free air, gallstones in right upper quadrant. LFTs are normal  Accelerated hypertension - improving, - Likely worsened due to inability for taking antihypertensives orally. BP was in 200s at the time of presentation  Diet: carb modified Fluids: none DVT Prophylaxis: heparin  Code Status: Full Family Communication: husband bedside  Disposition Plan: inpatient  Consultants:  Oncology   Procedures:  none   Antibiotics - none  HPI/Subjective: - mild nausea, breathing "OK"  Objective: Filed Vitals:   06/10/13 2015 06/10/13 2113 06/10/13 2249 06/11/13 0458  BP: 198/79 226/76 178/61 157/60  Pulse: 70 84  71  Temp:  98.1 F (36.7 C)  97.5 F (36.4 C)  TempSrc:  Oral  Oral  Resp: 16 18  18   Height:  4\' 10"  (1.473 m)    Weight:  78.2 kg (172 lb 6.4 oz)    SpO2: 98% 100%  100%    Intake/Output Summary (Last 24 hours) at 06/11/13 0942 Last data filed at 06/11/13 0700  Gross per 24 hour  Intake    240 ml  Output   1150 ml  Net   -910 ml   Filed Weights   06/10/13 2113  Weight: 78.2 kg (172 lb 6.4 oz)    Exam:   General:  NAD  Cardiovascular: regular rate and rhythm, without MRG  Respiratory: good air movement, clear to auscultation throughout, no wheezing, ronchi or rales  Abdomen: soft, not tender to palpation, positive  bowel sounds  MSK: 1 + bilateral LE edema, R>L  Neuro: CN 2-12 grossly intact, MS 5/5 in all 4  Data Reviewed: Basic Metabolic Panel:  Recent Labs Lab 06/07/13 1215 06/10/13 1446 06/11/13 0437  NA 140 141 142  K 4.0 3.5* 4.0  CL 92* 96 99  CO2 30 30 31   GLUCOSE 337* 168* 198*  BUN 47* 37* 29*  CREATININE 1.83* 1.51* 1.50*  CALCIUM 9.8 9.3 8.4   Liver Function Tests:  Recent Labs Lab 06/07/13 1215 06/10/13 1446  AST 15 14  ALT 32 25  ALKPHOS 99 94  BILITOT 0.5 0.5  PROT 7.0 6.5  ALBUMIN 3.7 3.4*    Recent Labs Lab 06/07/13 1215 06/10/13 1446  LIPASE 91* 76*   CBC:  Recent Labs Lab 06/07/13 0954 06/07/13 1215 06/10/13 1446 06/11/13 0437  WBC 19.9* 17.3* 14.8* 12.6*  NEUTROABS 14.8* 14.2* 11.6*  --   HGB 11.5* 11.2* 10.6* 9.8*  HCT 34.9 34.1* 32.5* 29.6*  MCV 91.1 90.9 91.0 90.8  PLT 317 272 209 210   Cardiac Enzymes:  Recent Labs Lab 06/07/13 1215 06/10/13 1550  TROPONINI <0.30 <0.30   BNP (last 3 results)  Recent Labs  06/07/13 1215  PROBNP 557.1*   CBG:  Recent Labs Lab 06/10/13 2106 06/11/13 0732  GLUCAP 252* 165*    Recent Results (from the past 240 hour(s))  CULTURE, BLOOD (ROUTINE X 2)  Status: None   Collection Time    06/10/13  3:50 PM      Result Value Ref Range Status   Specimen Description BLOOD LEFT ANTECUBITAL   Final   Special Requests BOTTLES DRAWN AEROBIC AND ANAEROBIC 5CC   Final   Culture  Setup Time     Final   Value: 06/10/2013 22:25     Performed at [facility]   Culture     Final   Value:        BLOOD CULTURE RECEIVED NO GROWTH TO DATE CULTURE WILL BE HELD FOR 5 DAYS BEFORE ISSUING A FINAL NEGATIVE REPORT     Performed at [facility]   Report Status PENDING   Incomplete  CULTURE, BLOOD (ROUTINE X 2)     Status: None   Collection Time    06/10/13  4:08 PM      Result Value Ref Range Status   Specimen Description BLOOD L CHEST   Final   Special Requests BOTTLES DRAWN AEROBIC AND  ANAEROBIC 5ML   Final   Culture  Setup Time     Final   Value: 06/10/2013 22:25     Performed at [facility]   Culture     Final   Value:        BLOOD CULTURE RECEIVED NO GROWTH TO DATE CULTURE WILL BE HELD FOR 5 DAYS BEFORE ISSUING A FINAL NEGATIVE REPORT     Performed at [facility]   Report Status PENDING   Incomplete     Studies: Nm Pulmonary Perf And Vent  06/10/2013   CLINICAL DATA:  Hypoxia; history of malignancy  EXAM: NUCLEAR MEDICINE VENTILATION - PERFUSION LUNG SCAN  Views: Anterior, posterior, left lateral, right lateral, RPO, LPO, RAO, LAO -ventilation and perfusion  Radionuclide: Technetium 43m DTPA -ventilation; Technetium 82m macroaggregated albumin-perfusion  Dose: 40 0.0 mCi -ventilation; 5.0 mCi-perfusion  Route of administration: Inhalation -ventilation; intravenous-perfusion  COMPARISON:  Chest radiograph June 10, 2013  FINDINGS: Radiotracer uptake on the ventilation study is homogeneous and symmetric bilaterally.  On the perfusion study, there are segmental defects in the lateral and posterior segments of the right lower lobe. Elsewhere, radiotracer uptake is normal on the perfusion study.  IMPRESSION: There are two perfusion defects without corresponding ventilation defects. The chest radiograph does not show infiltrate in the right base. These findings constitute an overall high probability of pulmonary embolus.  Critical Value/emergent results were called by telephone at the time of interpretation on 06/10/2013 at 7:05 PM to Dr. Pryor Curia , who verbally acknowledged these results.   Electronically Signed   By: Lowella Grip M.D.   On: 06/10/2013 19:08   Dg Abd Acute W/chest  06/10/2013   CLINICAL DATA:  Short of breath, vomiting, on chemotherapy for breast carcinoma  EXAM: ACUTE ABDOMEN SERIES (ABDOMEN 2 VIEW & CHEST 1 VIEW)  COMPARISON:  Chest x-ray of 06/09/2013  FINDINGS: No active infiltrate or effusion is seen. A Port-A-Cath is noted from the left  with the tip overlying the mid SVC. The heart is within normal limits in size.  Supine and left lateral decubitus films of the abdomen show a large body habitus. However no bowel obstruction is seen and air is noted to the rectum. No free air is seen in the erect view. There are rounded calcifications in the right upper quadrant consistent with gallstones.  IMPRESSION: 1. No active lung disease. 2. No bowel obstruction or free air. 3. Gallstones in the right  upper quadrant   Electronically Signed   By: Ivar Drape M.D.   On: 06/10/2013 16:40    Scheduled Meds: . atorvastatin  10 mg Oral Daily  . cloNIDine  0.2 mg Oral BID  . insulin aspart  0-5 Units Subcutaneous QHS  . insulin aspart  0-9 Units Subcutaneous TID WC  . potassium chloride  20 mEq Oral Daily  . sodium chloride  3 mL Intravenous Q12H  . traMADol  50 mg Oral BID  . verapamil  240 mg Oral Daily   Continuous Infusions: . heparin 1,000 Units/hr (06/10/13 2001)    Principal Problem:   Pulmonary embolism Active Problems:   Breast cancer of upper-outer quadrant of right female breast   Edema leg   Nausea and vomiting   Accelerated hypertension   Time spent: 35  This note has been created with Surveyor, quantity. Any transcriptional errors are unintentional.   Marzetta Board, MD [facility] Pager [phone]. If 7 PM - 7 AM, please contact night-coverage at [email or web address], password [facility] 06/11/2013, 9:42 AM  LOS: 1 day

## 2013-06-11 NOTE — Progress Notes (Signed)
ANTICOAGULATION CONSULT NOTE - Follow Up Consult  Pharmacy Consult for Heparin Indication: pulmonary embolus  No Known Allergies  Patient Measurements: Height: 4\' 10"  (147.3 cm) Weight: 172 lb 6.4 oz (78.2 kg) IBW/kg (Calculated) : 40.9 Heparin Dosing Weight:   Vital Signs: Temp: 97.5 F (36.4 C) (04/10 0458) Temp src: Oral (04/10 0458) BP: 157/60 mmHg (04/10 0458) Pulse Rate: 71 (04/10 0458)  Labs:  Recent Labs  06/10/13 1446 06/10/13 1550 06/10/13 1959 06/11/13 0437  HGB 10.6*  --   --  9.8*  HCT 32.5*  --   --  29.6*  PLT 209  --   --  210  APTT  --   --  23*  --   LABPROT  --   --  15.3*  --   INR  --   --  1.24  --   HEPARINUNFRC  --   --   --  0.63  CREATININE 1.51*  --   --  1.50*  TROPONINI  --  <0.30  --   --     Estimated Creatinine Clearance: 24.6 ml/min (by C-G formula based on Cr of 1.5).   Medications:  Infusions:  . heparin 1,000 Units/hr (06/10/13 2001)    Assessment: Patient with heparin level at goal.  No issues per RN.  Goal of Therapy:  Heparin level 0.3-0.7 units/ml Monitor platelets by anticoagulation protocol: Yes   Plan:  Continue heparin at current rate. Recheck level at [place] 06/11/2013,5:50 AM

## 2013-06-11 NOTE — Care Management Note (Addendum)
    Page 1 of 2   06/17/2013     2:29:15 PM   CARE MANAGEMENT NOTE 06/17/2013  Patient:  Sabrina [name], Sabrina [name]   Account Number:  [id number]  Date Initiated:  06/11/2013  Documentation initiated by:  Dessa Phi  Subjective/Objective Assessment:   80 Y/O F ADMITTED W/PE,BILAT DVT.NEW DX DUCTAL CA OF R BREAST.     Action/Plan:   FROM HOME W/SPOUSE.HAS CANE.HAS PCP,PHARMACY.   Anticipated DC Date:  06/18/2013   Anticipated DC Plan:  [place]  CM consult      [facility] Choice  HOME HEALTH   Choice offered to / List presented to:  C-1 Patient   DME arranged  [place] arranged  HH-1 RN  [place].   Status of service:  In process, will continue to follow Medicare Important Message given?   (If response is "NO", the following Medicare IM given date fields will be blank) Date Medicare IM given:   Date Additional Medicare IM given:    Discharge Disposition:    Per UR Regulation:  Reviewed for med. necessity/level of care/duration of stay  If discussed at [name] of Stay Meetings, dates discussed:   06/15/2013  06/17/2013    Comments:  06/17/13 Ekam Bonebrake RN,BSN NCM [phone] INR-2.1,NEED 1 DAY COUMADIN OVERLAP.Rafael Gonzalez FOR HHRN/PT,DME RW.  06/16/13 Ashante Yellin RN,BSN NCM [place] INR-1.78,GOAL-2-3 COUMADIN/HEPARIN BRIDGE.[facility] FOLLOWING FOR HHC ORDERS.RECOMMEND HHRN-PT/INR CHECKS/HHPT,RW.[facility] AWARE OF HHC/DME ORDERS PLACED.  06/15/13 Tari Lecount RN,BSN NCM [phone] [place] [facility].TC KRISTEN [facility] REP AWARE OF REFERRAL.HHPT,RW AWAIT FINAL HH ORDERS.  06-14-13 Sunday Spillers RN CM 1500 Spoke with patient at bedside, discussed recommendations for [facility] PT. Patient is agreeable, provided a list for choice, she wants to discuss with family/friends, will f/u tomorrow.  06/11/13 Luanne Krzyzanowski RN,BSN NCM [phone] ONC FOLLOWING.HEP GTT,02.IF HOME 02 NEEDED CAN ARRANGE.RECOMMEND PT/OT  CONS.

## 2013-06-11 NOTE — Progress Notes (Signed)
Brief Pharmacy Note: Heparin infusion  2nd Heparin level 0.69 units/ml on 1000 units/hr with 2000 unit bolus given 4/9 ~ 8p. First level this am 0.63  Continue same Heparin rate: 1000 units/hr  Recheck level in 8 hr at 2200 Daily Heparin level to begin 4/11  Minda Ditto PharmD Pager [phone] 06/11/2013, 3:13 PM

## 2013-06-11 NOTE — Progress Notes (Signed)
INITIAL NUTRITION ASSESSMENT  DOCUMENTATION CODES Per approved criteria  -Obesity Unspecified   INTERVENTION: -Recommend Glucerna Shake po Q24H, each supplement provides 220 kcal and 10 grams of protein -Reviewed nutrition therapy for nausea, and increasing taste sensations -Encouraged pt to continue to see Outpatient Oncology RD -Will continue to monitor  NUTRITION DIAGNOSIS: Inadequate oral intake related to nausea/loss of taste/loose stools as evidenced by PO intake <75%., 13 lb unintentional wt loss  Goal: Pt to meet >/= 90% of their estimated nutrition needs    Monitor:  Total protein/energy intake, labs, weights, GI profile  Reason for Assessment: MST  80 y.o. female  Admitting Dx: Pulmonary embolism  ASSESSMENT: Patient is 80 year old female with history of new diagnosis of T1 N1 right breast cancer , following Dr. Humphrey Rolls, status post lumpectomy, on the chemotherapies started in March 2015, chronic renal insufficiency, hypertension, diabetes mellitus presented to the ER for intractable nausea and vomiting since last night and unable to take her medications. She has noticed that she was feeling short of breath and noticed her lower extremity edema right worse than left in the last 1 week  -Pt endorsed ongoing nausea for past two weeks. Has had multiple chemo related side effects; including loose stools, nausea,and taste changes -Was evaluated by outpatient Oncology RD on 3/22. Discussed ways to improve loose stools and heart healthy proteins to incorporate into diet -Diet recall indicates pt attempting to eat 2-3 small meals/day, but will experience episodes of vomiting several hours after meals -Breakfast consists of cereal or toast w/peanut butter, juice and fruit. Lunch and dinner are typically different sandwiches such as egg salad, or a soup. Will occasionally eat yogurts for snacks.  -Tried Boost supplement once, and enjoyed chocolate flavor as chocolate is one of her  remaining taste sensations. Discussed other ways to improve taste alterations-fresh herbs, fruity marinades -PO intake 50% of breakfast -Pt endorsed unintentional wt loss, wt decreased 13 lbs in past one month -Was willing to try Glucerna shake once daily for additional protein/kcal -Elevated renal profile d/t hx of renal insufficiency -Elevated CBG's  Height: Ht Readings from Last 1 Encounters:  06/10/13 4\' 10"  (1.473 m)    Weight: Wt Readings from Last 1 Encounters:  06/10/13 172 lb 6.4 oz (78.2 kg)    Ideal Body Weight: 95 lbs  % Ideal Body Weight: 181%  Wt Readings from Last 10 Encounters:  06/10/13 172 lb 6.4 oz (78.2 kg)  06/07/13 171 lb 6.4 oz (77.747 kg)  05/31/13 179 lb 9.6 oz (81.466 kg)  05/24/13 176 lb 11.2 oz (80.151 kg)  05/17/13 180 lb 14.4 oz (82.056 kg)  05/10/13 185 lb 3.2 oz (84.006 kg)  05/05/13 183 lb (83.008 kg)  04/22/13 186 lb 6.4 oz (84.55 kg)  04/15/13 185 lb (83.915 kg)  04/15/13 185 lb (83.915 kg)    Usual Body Weight: 185 lbs  % Usual Body Weight: 93%  BMI:  Body mass index is 36.04 kg/(m^2). Obesity II  Estimated Nutritional Needs: Kcal: 1550-1750 Protein: 65-75 gram Fluid: >/=1600 mldaily  Skin: +2 edema in RLE and LLE  Diet Order: Carb Control  EDUCATION NEEDS: -Education needs addressed   Intake/Output Summary (Last 24 hours) at 06/11/13 1212 Last data filed at 06/11/13 1000  Gross per 24 hour  Intake    243 ml  Output   1150 ml  Net   -907 ml    Last BM: pta  Labs:   Recent Labs Lab 06/07/13 1215 06/10/13 1446 06/11/13 0437  NA  140 141 142  K 4.0 3.5* 4.0  CL 92* 96 99  CO2 30 30 31   BUN 47* 37* 29*  CREATININE 1.83* 1.51* 1.50*  CALCIUM 9.8 9.3 8.4  GLUCOSE 337* 168* 198*    CBG (last 3)   Recent Labs  06/10/13 2106 06/11/13 0732 06/11/13 1147  GLUCAP 252* 165* 198*    Scheduled Meds: . atorvastatin  10 mg Oral Daily  . cloNIDine  0.2 mg Oral BID  . feeding supplement (GLUCERNA SHAKE)  237  mL Oral Q24H  . insulin aspart  0-5 Units Subcutaneous QHS  . insulin aspart  0-9 Units Subcutaneous TID WC  . potassium chloride  20 mEq Oral Daily  . sodium chloride  3 mL Intravenous Q12H  . traMADol  50 mg Oral BID  . verapamil  240 mg Oral Daily    Continuous Infusions: . heparin 1,000 Units/hr (06/10/13 2001)    Past Medical History  Diagnosis Date  . Heart murmur     no known problems; states did not know she had murmur until age 72  . Immature cataract   . Arthritis     neck  . Wears partial dentures     lower  . Dental crowns present   . Hypertension     fluctuates, especially when stressed; has been on med. > 20 yr.  . Non-insulin dependent type 2 diabetes mellitus   . High cholesterol   . Chronic kidney disease (CKD), stage III (moderate)     nephrologist, Dr. [name]  . Breast cancer 03/2013    right    Past Surgical History  Procedure Laterality Date  . Tonsillectomy      as a child  . Breast surgery Right 11/1958    right breast biopsy benign  . Dilation and curettage of uterus    . Breast lumpectomy with needle localization Right 03/30/2013    Procedure: BREAST LUMPECTOMY WITH NEEDLE LOCALIZATION;  Surgeon: Rolm Bookbinder, MD;  Location: [place];  Service: General;  Laterality: Right;  . Axillary lymph node dissection Right 03/30/2013    Procedure: AXILLARY LYMPH NODE DISSECTION;  Surgeon: Rolm Bookbinder, MD;  Location: [place];  Service: General;  Laterality: Right;  . Re-excision of breast cancer,superior margins Right 04/15/2013    Procedure: RE-EXCISION OF RIGHT BREAST  MARGINS;  Surgeon: Rolm Bookbinder, MD;  Location: [place];  Service: General;  Laterality: Right;  . Portacath placement N/A 04/15/2013    Procedure: INSERTION PORT-A-CATH;  Surgeon: Rolm Bookbinder, MD;  Location: [place];  Service: General;  Laterality: N/A;    Atlee Abide MS RD LDN Clinical Dietitian [phone]

## 2013-06-11 NOTE — Consult Note (Signed)
Sabrina [name]   DOB:1927/01/02   EC#:[id number]   CSN#:[id number] I have seen the patient, examined her. Please see separate consult notes today to supplement the notes below Subjective: Patient is an 80 y/o female with triple positive invasive ductal carcinoma of the right breast.  Her past medical history is also significant for chronic renal insufficiency, Hypertension, and diabetes.  She underwent lumpectomy and then was treated with adjuvant Taxol/Herceptin that started on 05/10/13.  She has experienced severe nausea and vomiting since starting this therapy.  She hasn't received Taxol, since 3/9, however at her last appointment with me on 06/07/13 she was vomiting and was unable to keep her PO meds down.  She was sent to the ER due to the fact that her BP was 220/90 manually.  She was treated for a UTI given Keflex and discharged from the ER, however returned on 06/10/13 with nausea, vomiting, along with shortness of breath.  She had increased right lower extremity edema. A VQ scan was also done which demonstrated a high probability of 2 segmental right lower lobe pulmonary emboli.  She was admitted to the hospital and started on a Heparin gtt.  She had a bilateral lower extremity doppler this morning that demonstrated bilateral DVTs.  I met with her today, and she states that her nausea finally stopped last night.  She is drinking Glucerna.  She is aware that she has bilateral lower extremity DVTs.  She tells me that her shortness of breath is improved.  She isn't getting out of bed very much.  She denies any further fevers, chills, nausea, vomiting, constipation, diarrhea, pain.     Objective:  Filed Vitals:   06/11/13 0800  BP:   Pulse:   Temp:   Resp: 20    Body mass index is 36.04 kg/(m^2).  Intake/Output Summary (Last 24 hours) at 06/11/13 1340 Last data filed at 06/11/13 1000  Gross per 24 hour  Intake    243 ml  Output   1150 ml  Net   -907 ml     Sclerae unicteric  Oropharynx clear  No  peripheral adenopathy  Lungs clear -- no rales or rhonchi  Heart regular rate and rhythm  Abdomen benign  MSK no focal spinal tenderness, no peripheral  edema  Neuro nonfocal  Extremities with 2+ bilateral lower pitting edema  CBG (last 3)   Recent Labs  06/10/13 2106 06/11/13 0732 06/11/13 1147  GLUCAP 252* 165* 198*     Labs:  Lab Results  Component Value Date   WBC 12.6* 06/11/2013   HGB 9.8* 06/11/2013   HCT 29.6* 06/11/2013   MCV 90.8 06/11/2013   PLT 210 06/11/2013   NEUTROABS 11.6* 06/10/2013    Urine Studies No results found for this basename: UACOL, UAPR, USPG, UPH, UTP, UGL, UKET, UBIL, UHGB, UNIT, UROB, ULEU, UEPI, UWBC, URBC, UBAC, CAST, CRYS, UCOM, BILUA,  in the last 72 hours  Basic Metabolic Panel:  Recent Labs Lab 06/07/13 1215 06/10/13 1446 06/11/13 0437  NA 140 141 142  K 4.0 3.5* 4.0  CL 92* 96 99  CO2 _0 GLUCOSE 337* 168* 198*  BUN 47* 37* 29*  CREATININE 1.83* 1.51* 1.50*  CALCIUM 9.8 9.3 8.4   GFR Estimated Creatinine Clearance: 24.6 ml/min (by C-G formula based on Cr of 1.5). Liver Function Tests:  Recent Labs Lab 06/07/13 1215 06/10/13 1446  AST 15 14  ALT 32 25  ALKPHOS 99 94  BILITOT 0.5 0.5  PROT  7.0 6.5  ALBUMIN 3.7 3.4*    Recent Labs Lab 06/07/13 1215 06/10/13 1446  LIPASE 91* 76*   No results found for this basename: AMMONIA,  in the last 168 hours Coagulation profile  Recent Labs Lab 06/10/13 1959  INR 1.24    CBC:  Recent Labs Lab 06/07/13 0954 06/07/13 1215 06/10/13 1446 06/11/13 0437  WBC 19.9* 17.3* 14.8* 12.6*  NEUTROABS 14.8* 14.2* 11.6*  --   HGB 11.5* 11.2* 10.6* 9.8*  HCT 34.9 34.1* 32.5* 29.6*  MCV 91.1 90.9 91.0 90.8  PLT 317 272 209 210   Cardiac Enzymes:  Recent Labs Lab 06/07/13 1215 06/10/13 1550  TROPONINI <0.30 <0.30   BNP: No components found with this basename: POCBNP,  CBG:  Recent Labs Lab 06/10/13 2106 06/11/13 0732 06/11/13 1147  GLUCAP 252* 165* 198*    D-Dimer No results found for this basename: DDIMER,  in the last 72 hours Hgb A1c  Recent Labs  06/10/13 1446  HGBA1C 8.5*   Lipid Profile No results found for this basename: CHOL, HDL, LDLCALC, TRIG, CHOLHDL, LDLDIRECT,  in the last 72 hours Thyroid function studies No results found for this basename: TSH, T4TOTAL, FREET3, T3FREE, THYROIDAB,  in the last 72 hours Anemia work up No results found for this basename: VITAMINB12, FOLATE, FERRITIN, TIBC, IRON, RETICCTPCT,  in the last 72 hours Microbiology Recent Results (from the past 240 hour(s))  CULTURE, BLOOD (ROUTINE X 2)     Status: None   Collection Time    06/10/13  3:50 PM      Result Value Ref Range Status   Specimen Description BLOOD LEFT ANTECUBITAL   Final   Special Requests BOTTLES DRAWN AEROBIC AND ANAEROBIC 5CC   Final   Culture  Setup Time     Final   Value: 06/10/2013 22:25     Performed at [facility]   Culture     Final   Value:        BLOOD CULTURE RECEIVED NO GROWTH TO DATE CULTURE WILL BE HELD FOR 5 DAYS BEFORE ISSUING A FINAL NEGATIVE REPORT     Performed at [facility]   Report Status PENDING   Incomplete  CULTURE, BLOOD (ROUTINE X 2)     Status: None   Collection Time    06/10/13  4:08 PM      Result Value Ref Range Status   Specimen Description BLOOD L CHEST   Final   Special Requests BOTTLES DRAWN AEROBIC AND ANAEROBIC 5ML   Final   Culture  Setup Time     Final   Value: 06/10/2013 22:25     Performed at [facility]   Culture     Final   Value:        BLOOD CULTURE RECEIVED NO GROWTH TO DATE CULTURE WILL BE HELD FOR 5 DAYS BEFORE ISSUING A FINAL NEGATIVE REPORT     Performed at [facility]   Report Status PENDING   Incomplete      Studies:  Nm Pulmonary Perf And Vent  06/10/2013   CLINICAL DATA:  Hypoxia; history of malignancy  EXAM: NUCLEAR MEDICINE VENTILATION - PERFUSION LUNG SCAN  Views: Anterior, posterior, left lateral, right lateral, RPO, LPO, RAO,  LAO -ventilation and perfusion  Radionuclide: Technetium 20mDTPA -ventilation; Technetium 95macroaggregated albumin-perfusion  Dose: 40 0.0 mCi -ventilation; 5.0 mCi-perfusion  Route of administration: Inhalation -ventilation; intravenous-perfusion  COMPARISON:  Chest radiograph June 10, 2013  FINDINGS: Radiotracer uptake on  the ventilation study is homogeneous and symmetric bilaterally.  On the perfusion study, there are segmental defects in the lateral and posterior segments of the right lower lobe. Elsewhere, radiotracer uptake is normal on the perfusion study.  IMPRESSION: There are two perfusion defects without corresponding ventilation defects. The chest radiograph does not show infiltrate in the right base. These findings constitute an overall high probability of pulmonary embolus.  Critical Value/emergent results were called by telephone at the time of interpretation on 06/10/2013 at 7:05 PM to Dr. Pryor Curia , who verbally acknowledged these results.   Electronically Signed   By: Lowella Grip M.D.   On: 06/10/2013 19:08   Dg Abd Acute W/chest  06/10/2013   CLINICAL DATA:  Short of breath, vomiting, on chemotherapy for breast carcinoma  EXAM: ACUTE ABDOMEN SERIES (ABDOMEN 2 VIEW & CHEST 1 VIEW)  COMPARISON:  Chest x-ray of 06/09/2013  FINDINGS: No active infiltrate or effusion is seen. A Port-A-Cath is noted from the left with the tip overlying the mid SVC. The heart is within normal limits in size.  Supine and left lateral decubitus films of the abdomen show a large body habitus. However no bowel obstruction is seen and air is noted to the rectum. No free air is seen in the erect view. There are rounded calcifications in the right upper quadrant consistent with gallstones.  IMPRESSION: 1. No active lung disease. 2. No bowel obstruction or free air. 3. Gallstones in the right upper quadrant   Electronically Signed   By: Ivar Drape M.D.   On: 06/10/2013 16:40    Assessment/Plan:   Patient is  an 80 year old female with:  1. Triple positive invasive ductal carcinoma of the right breast.  Patient is s/p lumpectomy and started treatment with Taxol/Herceptin on 3/9.  She has had increased difficulty with this therapy due to nausea and vomiting, and hasn't received treatment with taxol since that date.  We will f/u with the patient in clinic once she is discharged and re-evaluate her treatment plans at that time.  2. Nausea and vomiting.  Continue IV anti-emetics.  3. Bilateral DVT with pulmonary embolism.  Patient currently on heparin gtt.  Doppler earlier today positive for bilateral lower extremity DVTs.  Agree with echo along with transition to coumadin.  We can manage patient in coumadin clinic when discharged.  I have communicated this with the patient.    4.  Diet: advance as tolerated.  5. Activity, PT consult and evaluation would be helpful.    FULL CODE  Minette Headland, [place] [phone]  06/11/2013 Heath Lark, MD 06/11/2013

## 2013-06-11 NOTE — Progress Notes (Signed)
Bilateral lower extremity venous duplex completed.  Right:  DVT noted in the popliteal, posterior tibial, and peroneal veins.  No evidence of superficial thrombosis.  No Baker's cyst.  Left: DVT noted in the posterior tibial and peroneal veins.  No evidence of superficial thrombosis.  No Baker's cyst.

## 2013-06-11 NOTE — Consult Note (Signed)
[facility] CONSULT NOTE  Patient Care Team: [name], MD as PCP - General (Internal Medicine) Deatra Robinson, MD as Consulting Physician (Oncology)  CHIEF COMPLAINTS/PURPOSE OF CONSULTATION:  Newly diagnosed DVT and PE, on background history of breast cancer  HISTORY OF PRESENTING ILLNESS:  Sabrina [name] 80 y.o. female was admitted through the emergency department after presentation with right lower leg pain and shortness breath. This patient was diagnosed with T1, N1, M0 ER/PR HER-2/neu positive breast cancer and currently undergoing adjuvant chemotherapy with paclitaxel and Herceptin under the care of Dr. Humphrey Rolls. Since she was started on chemotherapy, she has severe nausea, vomiting and recurrent episodes of dehydration. The patient also has recent diagnosis of urinary tract infection. At the emergency department, she was allowed to be hypoxemic with associated shortness of breath and was subsequently diagnosed with DVT and PE. She is admitted to the hospital to start anticoagulation therapy and I am consulted for this.  She denies right lower extremity swelling, warmth & erythema. She does the spinal tenderness on the right lower extremity. She denies recent chest pain on exertion but complained of shortness of breath on minimal exertion. She denies pre-syncopal episodes, hemoptysis, or palpitation. She denies recent history of trauma, long distance travel, smoking or prolonged immobilization. She did have severe nausea, vomiting and dehydration. She had multiple surgeries over the past a few months for her diagnosis of breast cancer.  She had prior surgeries before and never had perioperative thromboembolic events. The patient had never been pregnant before. There is no family history of blood clots or miscarriages.  Please see Dr. Laurelyn Sickle recent progress notes pertaining to her diagnosis of right breast cancer. She is due for chemotherapy next Monday. MEDICAL HISTORY:   Past Medical History  Diagnosis Date  . Heart murmur     no known problems; states did not know she had murmur until age 56  . Immature cataract   . Arthritis     neck  . Wears partial dentures     lower  . Dental crowns present   . Hypertension     fluctuates, especially when stressed; has been on med. > 20 yr.  . Non-insulin dependent type 2 diabetes mellitus   . High cholesterol   . Chronic kidney disease (CKD), stage III (moderate)     nephrologist, Dr. [name]  . Breast cancer 03/2013    right    SURGICAL HISTORY: Past Surgical History  Procedure Laterality Date  . Tonsillectomy      as a child  . Breast surgery Right 11/1958    right breast biopsy benign  . Dilation and curettage of uterus    . Breast lumpectomy with needle localization Right 03/30/2013    Procedure: BREAST LUMPECTOMY WITH NEEDLE LOCALIZATION;  Surgeon: Rolm Bookbinder, MD;  Location: [name];  Service: General;  Laterality: Right;  . Axillary lymph node dissection Right 03/30/2013    Procedure: AXILLARY LYMPH NODE DISSECTION;  Surgeon: Rolm Bookbinder, MD;  Location: [facility];  Service: General;  Laterality: Right;  . Re-excision of breast cancer,superior margins Right 04/15/2013    Procedure: RE-EXCISION OF RIGHT BREAST  MARGINS;  Surgeon: Rolm Bookbinder, MD;  Location: [facility];  Service: General;  Laterality: Right;  . Portacath placement N/A 04/15/2013    Procedure: INSERTION PORT-A-CATH;  Surgeon: Rolm Bookbinder, MD;  Location: [facility];  Service: General;  Laterality: N/A;    SOCIAL HISTORY: History   Social History  .  Marital Status: Married    Spouse Name: N/A    Number of Children: 0  . Years of Education: N/A   Occupational History  . Not on file.   Social History Main Topics  . Smoking status: Former Smoker    Types: Cigarettes    Quit date: 06/11/1963  . Smokeless tobacco: Never Used     Comment: quit smoking in 1965  . Alcohol  Use: 0.0 oz/week     Comment: 2-3 glasses wine/week  . Drug Use: No  . Sexual Activity: Not Currently   Other Topics Concern  . Not on file   Social History Narrative  . No narrative on file    FAMILY HISTORY: Family History  Problem Relation Age of Onset  . Pneumonia Mother   . Heart attack Father   . Breast cancer Other 49  . Breast cancer Other 50    ALLERGIES:  has No Known Allergies.  MEDICATIONS:  Current Facility-Administered Medications  Medication Dose Route Frequency Provider Last Rate Last Dose  . acetaminophen (TYLENOL) tablet 650 mg  650 mg Oral Q6H PRN Ripudeep Krystal Eaton, MD       Or  . acetaminophen (TYLENOL) suppository 650 mg  650 mg Rectal Q6H PRN Ripudeep K Rai, MD      . alum & mag hydroxide-simeth (MAALOX/MYLANTA) 200-200-20 MG/5ML suspension 30 mL  30 mL Oral Q6H PRN Ripudeep K Rai, MD      . atorvastatin (LIPITOR) tablet 10 mg  10 mg Oral Daily Ripudeep Krystal Eaton, MD   10 mg at 06/10/13 2141  . cloNIDine (CATAPRES) tablet 0.2 mg  0.2 mg Oral BID Ripudeep K Rai, MD   0.2 mg at 06/11/13 0957  . heparin ADULT infusion 100 units/mL (25000 units/250 mL)  1,000 Units/hr Intravenous Continuous Emiliano Dyer, RPH 10 mL/hr at 06/10/13 2001 1,000 Units/hr at 06/10/13 2001  . HYDROcodone-acetaminophen (NORCO/VICODIN) 5-325 MG per tablet 1-2 tablet  1-2 tablet Oral Q4H PRN Ripudeep K Rai, MD      . HYDROmorphone (DILAUDID) injection 1 mg  1 mg Intravenous Q4H PRN Ripudeep K Rai, MD      . insulin aspart (novoLOG) injection 0-5 Units  0-5 Units Subcutaneous QHS Ripudeep Krystal Eaton, MD   3 Units at 06/10/13 2145  . insulin aspart (novoLOG) injection 0-9 Units  0-9 Units Subcutaneous TID WC Ripudeep Krystal Eaton, MD   2 Units at 06/11/13 0809  . labetalol (NORMODYNE,TRANDATE) injection 10 mg  10 mg Intravenous Q4H PRN Ripudeep K Rai, MD      . lactose free nutrition (BOOST PLUS) liquid 237 mL  237 mL Oral Q24H Hazle Coca, RD   237 mL at 06/11/13 1322  . LORazepam (ATIVAN)  tablet 0.5 mg  0.5 mg Oral Q6H PRN Ripudeep K Rai, MD      . ondansetron (ZOFRAN) tablet 4 mg  4 mg Oral Q6H PRN Ripudeep K Rai, MD       Or  . ondansetron (ZOFRAN) injection 4 mg  4 mg Intravenous Q6H PRN Ripudeep Krystal Eaton, MD   4 mg at 06/11/13 1309  . potassium chloride (K-DUR) CR tablet 20 mEq  20 mEq Oral Daily Ripudeep Krystal Eaton, MD   20 mEq at 06/11/13 0957  . prochlorperazine (COMPAZINE) tablet 10 mg  10 mg Oral Q6H PRN Ripudeep K Rai, MD      . sodium chloride 0.9 % injection 3 mL  3 mL Intravenous Q12H Ripudeep Krystal Eaton, MD  3 mL at 06/11/13 1000  . traMADol (ULTRAM) tablet 50 mg  50 mg Oral BID Ripudeep Krystal Eaton, MD   50 mg at 06/11/13 0957  . verapamil (CALAN-SR) CR tablet 240 mg  240 mg Oral Daily Ripudeep Krystal Eaton, MD   240 mg at 06/11/13 0957    REVIEW OF SYSTEMS:   Constitutional: Denies fevers, chills or abnormal night sweats Eyes: Denies blurriness of vision, double vision or watery eyes Ears, nose, mouth, throat, and face: Denies mucositis or sore throat Skin: Denies abnormal skin rashes Lymphatics: Denies new lymphadenopathy or easy bruising Neurological:Denies numbness, tingling or new weaknesses Behavioral/Psych: Mood is stable, no new changes  All other systems were reviewed with the patient and are negative.  PHYSICAL EXAMINATION: ECOG PERFORMANCE STATUS: 2 - Symptomatic, <50% confined to bed  Filed Vitals:   06/11/13 1433  BP: 158/51  Pulse: 73  Temp: 98.3 F (36.8 C)  Resp: 18   Filed Weights   06/10/13 2113  Weight: 172 lb 6.4 oz (78.2 kg)    GENERAL:alert, no distress and comfortable. She is mildly obese SKIN: skin color, texture, turgor are normal, no rashes or significant lesions. Total alopecia is noted EYES: normal, conjunctiva are pink and non-injected, sclera clear OROPHARYNX:no exudate, no erythema and lips, buccal mucosa, and tongue normal  NECK: supple, thyroid normal size, non-tender, without nodularity LYMPH:  no palpable lymphadenopathy in the  cervical, axillary or inguinal LUNGS: clear to auscultation and percussion with normal breathing effort HEART: regular rate & rhythm and no murmurs and no lower extremity edema ABDOMEN:abdomen soft, non-tender and normal bowel sounds Musculoskeletal:no cyanosis of digits and no clubbing  PSYCH: alert & oriented x 3 with fluent speech NEURO: no focal motor/sensory deficits  LABORATORY DATA:  I have reviewed the data as listed Lab Results  Component Value Date   WBC 12.6* 06/11/2013   HGB 9.8* 06/11/2013   HCT 29.6* 06/11/2013   MCV 90.8 06/11/2013   PLT 210 06/11/2013     RADIOGRAPHIC STUDIES: I have personally reviewed the radiological images as listed and agreed with the findings in the report. Nm Pulmonary Perf And Vent  06/10/2013   CLINICAL DATA:  Hypoxia; history of malignancy  EXAM: NUCLEAR MEDICINE VENTILATION - PERFUSION LUNG SCAN  Views: Anterior, posterior, left lateral, right lateral, RPO, LPO, RAO, LAO -ventilation and perfusion  Radionuclide: Technetium 40mDTPA -ventilation; Technetium 93macroaggregated albumin-perfusion  Dose: 40 0.0 mCi -ventilation; 5.0 mCi-perfusion  Route of administration: Inhalation -ventilation; intravenous-perfusion  COMPARISON:  Chest radiograph June 10, 2013  FINDINGS: Radiotracer uptake on the ventilation study is homogeneous and symmetric bilaterally.  On the perfusion study, there are segmental defects in the lateral and posterior segments of the right lower lobe. Elsewhere, radiotracer uptake is normal on the perfusion study.  IMPRESSION: There are two perfusion defects without corresponding ventilation defects. The chest radiograph does not show infiltrate in the right base. These findings constitute an overall high probability of pulmonary embolus.  Critical Value/emergent results were called by telephone at the time of interpretation on 06/10/2013 at 7:05 PM to Dr. KRPryor Curia who verbally acknowledged these results.   Electronically Signed   By:  WiLowella Grip.D.   On: 06/10/2013 19:08   Ultrasound venous Doppler showed findings consistent with acute deep vein thrombosis involving the right popliteal, posterior tibial, and peroneal veins. Findings consistent with acute deep vein thrombosis involving the left posterior tibial and peroneal veins.  ASSESSMENT:  Bilateral DVT & PE on  background recent history of severe nausea, vomiting in the setting of ongoing adjuvant chemotherapy for locally advanced right breast cancer  PLAN:  #1 bilateral DVT and PE  #2 chronic kidney failure I reviewed with the patient about the plan for care for acute bilateral DVT & PE  This last episode of blood clot appeared to be provoked by recent surgery, diagnosis of cancer, and severe nausea, vomiting and dehydration.  The goal of anticoagulation therapy is minimum 12 months.   We discussed about various options of anticoagulation therapies including warfarin, low molecular weight heparin such as Lovenox or newer agents such as Rivaroxaban. Some of the risks and benefits discussed including costs involved, the need for monitoring, risks of life-threatening bleeding/hospitalization, reversibility of each agent in the event of bleeding or overdose, safety profile of each drug and taking into account other social issues such as ease of administration of medications, etc. Ultimately, we have made an informed decision for the patient to continue her treatment with warfarin, due to chronic renal failure. I recommend not starting her on warfarin at least for minimum 24-48 hours after adequate heparin therapy, and then consult pharmacy for overlap therapy with warfarin and to INR is at goal 2-3  I recommend the patient to use elastic compression stockings at 20-30 mmHg to reduce risks of chronic thrombophlebitis. Again, this can be placed once she is more stable.  There is no role for screening for thrombophilia disorder.  #3 T1, N1, M0 breast cancer I  recommend holding off treatment for now. Dr. Humphrey Rolls will see her back next week to determine when to restart her chemotherapy. The patient has expressed concern that she may not want to go for any further systemic treatment because it is making her sick.  #4 nausea, vomiting and dehydration I recommend aggressive IV fluid resuscitation.  #5 anemia This is likely anemia of chronic disease. The patient denies recent history of bleeding such as epistaxis, hematuria or hematochezia. She is asymptomatic from the anemia. We will observe for now.  She does not require transfusion now.  I recommend the patient to monitor for signs of bleeding carefully.  #6 leukocytosis and recent UTI Repeat urine culture is pending.  All questions were answered. The patient knows to call the clinic with any problems, questions or concerns.    Heath Lark, MD 06/11/2013 4:05 PM

## 2013-06-12 DIAGNOSIS — I359 Nonrheumatic aortic valve disorder, unspecified: Secondary | ICD-10-CM

## 2013-06-12 LAB — BASIC METABOLIC PANEL
BUN: 23 mg/dL (ref 6–23)
CO2: 30 mEq/L (ref 19–32)
Calcium: 8.9 mg/dL (ref 8.4–10.5)
Chloride: 101 mEq/L (ref 96–112)
Creatinine, Ser: 1.52 mg/dL — ABNORMAL HIGH (ref 0.50–1.10)
GFR calc Af Amer: 35 mL/min — ABNORMAL LOW (ref >90)
GFR calc non Af Amer: 30 mL/min — ABNORMAL LOW (ref >90)
Glucose, Bld: 161 mg/dL — ABNORMAL HIGH (ref 70–99)
Potassium: 3.7 mEq/L (ref 3.7–5.3)
Sodium: 140 mEq/L (ref 137–147)

## 2013-06-12 LAB — HEPARIN LEVEL (UNFRACTIONATED)
Heparin Unfractionated: 0.81 IU/mL — ABNORMAL HIGH (ref 0.30–0.70)
Heparin Unfractionated: 0.4 IU/mL (ref 0.30–0.70)

## 2013-06-12 LAB — GLUCOSE, CAPILLARY
Glucose-Capillary: 163 mg/dL — ABNORMAL HIGH (ref 70–99)
Glucose-Capillary: 190 mg/dL — ABNORMAL HIGH (ref 70–99)
Glucose-Capillary: 233 mg/dL — ABNORMAL HIGH (ref 70–99)
Glucose-Capillary: 207 mg/dL — ABNORMAL HIGH (ref 70–99)

## 2013-06-12 LAB — CBC
HCT: 29 % — ABNORMAL LOW (ref 36.0–46.0)
Hemoglobin: 9.3 g/dL — ABNORMAL LOW (ref 12.0–15.0)
MCH: 29.7 pg (ref 26.0–34.0)
MCHC: 32.1 g/dL (ref 30.0–36.0)
MCV: 92.7 fL (ref 78.0–100.0)
Platelets: 205 10*3/uL (ref 150–400)
RBC: 3.13 MIL/uL — ABNORMAL LOW (ref 3.87–5.11)
RDW: 14.8 % (ref 11.5–15.5)
WBC: 10.6 10*3/uL — ABNORMAL HIGH (ref 4.0–10.5)

## 2013-06-12 LAB — URINE CULTURE: Colony Count: 60000

## 2013-06-12 MED ORDER — HEPARIN (PORCINE) IN NACL 100-0.45 UNIT/ML-% IJ SOLN
800.0000 [IU]/h | INTRAMUSCULAR | Status: DC
  Administered 2013-06-12: 800 [IU]/h via INTRAVENOUS
  Filled 2013-06-12 (×2): qty 250

## 2013-06-12 MED ORDER — WARFARIN SODIUM 5 MG PO TABS
5.0000 mg | ORAL_TABLET | Freq: Once | ORAL | Status: AC
  Administered 2013-06-12: 5 mg via ORAL
  Filled 2013-06-12: qty 1

## 2013-06-12 MED ORDER — WARFARIN - PHARMACIST DOSING INPATIENT: Freq: Every day | Status: DC

## 2013-06-12 MED ORDER — ATORVASTATIN CALCIUM 10 MG PO TABS
10.0000 mg | ORAL_TABLET | Freq: Every day | ORAL | Status: DC
  Administered 2013-06-12 – 2013-06-18 (×7): 10 mg via ORAL
  Filled 2013-06-12 (×8): qty 1

## 2013-06-12 NOTE — Progress Notes (Addendum)
ANTICOAGULATION CONSULT NOTE - Initial Consult  Pharmacy Consult for Warfarin / Heparin Indication: PE and DVT  No Known Allergies  Patient Measurements: Height: 4\' 10"  (147.3 cm) Weight: 172 lb 6.4 oz (78.2 kg) IBW/kg (Calculated) : 40.9 Heparin Dosing Weight: 63.2kg  Vital Signs: Temp: 97.9 F (36.6 C) (04/11 0502) Temp src: Oral (04/11 0502) BP: 156/58 mmHg (04/11 0502) Pulse Rate: 63 (04/11 0502)  Labs:  Recent Labs  06/10/13 1446 06/10/13 1550 06/10/13 1959 06/11/13 0437 06/11/13 1420 06/12/13 0535 06/12/13 0744  HGB 10.6*  --   --  9.8*  --  9.3*  --   HCT 32.5*  --   --  29.6*  --  29.0*  --   PLT 209  --   --  210  --  205  --   APTT  --   --  23*  --   --   --   --   LABPROT  --   --  15.3*  --   --   --   --   INR  --   --  1.24  --   --   --   --   HEPARINUNFRC  --   --   --  0.63 0.69  --  0.81*  CREATININE 1.51*  --   --  1.50*  --  1.52*  --   TROPONINI  --  <0.30  --   --   --   --   --     Estimated Creatinine Clearance: 24.3 ml/min (by C-G formula based on Cr of 1.52).   Medical History: Past Medical History  Diagnosis Date  . Heart murmur     no known problems; states did not know she had murmur until age 26  . Immature cataract   . Arthritis     neck  . Wears partial dentures     lower  . Dental crowns present   . Hypertension     fluctuates, especially when stressed; has been on med. > 20 yr.  . Non-insulin dependent type 2 diabetes mellitus   . High cholesterol   . Chronic kidney disease (CKD), stage III (moderate)     nephrologist, Dr. [name]  . Breast cancer 03/2013    right   Assessment: 80 yo female with breast cancer, on chemotherapy, presents to ED with shortness of breath, nausea and vomiting. VQ scan shows high probability for RLL PE and LE dopplers positive for bilateral DVTs. Pharmacy consulted to dose IV heparin on 4/9, now coumadin added 4/11.  Oncology discussed various anticoagulation options with pt, and  warfarin was chosen due to CKD.    4/11: D1 coumadin/heparin bridge CBC: Hgb 9.3, trending down slightly, plts ok Baseline INR = WNL 50% of breakfast charted as eaten this AM Heparin level supratx this AM, rate reduced to 800 units/hr and rechecking tonight  Goal of Therapy:  INR 2-3 Monitor platelets by anticoagulation protocol: Yes   Plan:  Start coumadin 5mg  po x 1 tonight Daily PT/INR  Ralene Bathe, PharmD, BCPS 06/12/2013, 12:49 PM  Pager: [phone]  ADDENDUM:   Heparin level therapeutic at 0.40, no issues per RN, continue same rate and check with AM labs  Ralene Bathe, PharmD, BCPS 06/12/2013, 7:35 PM  Pager: [phone]

## 2013-06-12 NOTE — Progress Notes (Signed)
PROGRESS NOTE   Sabrina [name] [id number] DOB: [birth year] DOA: 06/10/2013 PCP: [name], MD  Assessment/Plan:  Pulmonary embolism - on heparin gtt, stable on nasal cannula. LE [place] positive for DVT. Hematology consulted, appreciate input. Given renal failure breast cancer, will start Coumadin - start Coumadin today, will be 36 h therapeutic on heparin by tonight.  - 2D echo pending,  Breast cancer of upper-outer quadrant of right female breast - she is being followed by Dr. Humphrey Rolls and will follow up as an outpatient.   Nausea and vomiting: No abdominal pain -Currently improving, able to tolerate a diet 4/11 am - Abdominal KUB shows no bowel obstruction or free air, gallstones in right upper quadrant. LFTs are normal   Accelerated hypertension - better  Diet: carb modified Fluids: none DVT Prophylaxis: heparin  Code Status: Full Family Communication: husband bedside  Disposition Plan: inpatient  Consultants:  Oncology   Procedures:  none   Antibiotics - none  HPI/Subjective: - feeling better this morning  Objective: Filed Vitals:   06/11/13 1433 06/11/13 2122 06/11/13 2233 06/12/13 0502  BP: 158/51 170/55 156/74 156/58  Pulse: 73 70  63  Temp: 98.3 F (36.8 C) 98.1 F (36.7 C)  97.9 F (36.6 C)  TempSrc: Oral Oral  Oral  Resp: 18 18  20   Height:      Weight:      SpO2: 100% 100%  99%    Intake/Output Summary (Last 24 hours) at 06/12/13 1138 Last data filed at 06/12/13 1100  Gross per 24 hour  Intake 1086.5 ml  Output   1800 ml  Net -713.5 ml   Filed Weights   06/10/13 2113  Weight: 78.2 kg (172 lb 6.4 oz)    Exam:  General:  NAD  Cardiovascular: regular rate and rhythm, without MRG  Respiratory: good air movement, clear to auscultation throughout, no wheezing, ronchi or rales  Abdomen: soft, not tender to palpation, positive bowel sounds  MSK: trace bilateral LE edema, R>L  Neuro: CN 2-12 grossly intact, MS 5/5 in all  4  Data Reviewed: Basic Metabolic Panel:  Recent Labs Lab 06/07/13 1215 06/10/13 1446 06/11/13 0437 06/12/13 0535  NA 140 141 142 140  K 4.0 3.5* 4.0 3.7  CL 92* 96 99 101  CO2 30 30 31 30   GLUCOSE 337* 168* 198* 161*  BUN 47* 37* 29* 23  CREATININE 1.83* 1.51* 1.50* 1.52*  CALCIUM 9.8 9.3 8.4 8.9   Liver Function Tests:  Recent Labs Lab 06/07/13 1215 06/10/13 1446  AST 15 14  ALT 32 25  ALKPHOS 99 94  BILITOT 0.5 0.5  PROT 7.0 6.5  ALBUMIN 3.7 3.4*    Recent Labs Lab 06/07/13 1215 06/10/13 1446  LIPASE 91* 76*   CBC:  Recent Labs Lab 06/07/13 0954 06/07/13 1215 06/10/13 1446 06/11/13 0437 06/12/13 0535  WBC 19.9* 17.3* 14.8* 12.6* 10.6*  NEUTROABS 14.8* 14.2* 11.6*  --   --   HGB 11.5* 11.2* 10.6* 9.8* 9.3*  HCT 34.9 34.1* 32.5* 29.6* 29.0*  MCV 91.1 90.9 91.0 90.8 92.7  PLT 317 272 209 210 205   Cardiac Enzymes:  Recent Labs Lab 06/07/13 1215 06/10/13 1550  TROPONINI <0.30 <0.30   BNP (last 3 results)  Recent Labs  06/07/13 1215  PROBNP 557.1*   CBG:  Recent Labs Lab 06/11/13 0732 06/11/13 1147 06/11/13 1611 06/11/13 2121 06/12/13 0753  GLUCAP 165* 198* 245* 201* 163*    Recent Results (from the past 240 hour(s))  CULTURE, BLOOD (ROUTINE X 2)     Status: None   Collection Time    06/10/13  3:50 PM      Result Value Ref Range Status   Specimen Description BLOOD LEFT ANTECUBITAL   Final   Special Requests BOTTLES DRAWN AEROBIC AND ANAEROBIC 5CC   Final   Culture  Setup Time     Final   Value: 06/10/2013 22:25     Performed at [facility]   Culture     Final   Value:        BLOOD CULTURE RECEIVED NO GROWTH TO DATE CULTURE WILL BE HELD FOR 5 DAYS BEFORE ISSUING A FINAL NEGATIVE REPORT     Performed at [facility]   Report Status PENDING   Incomplete  CULTURE, BLOOD (ROUTINE X 2)     Status: None   Collection Time    06/10/13  4:08 PM      Result Value Ref Range Status   Specimen Description BLOOD L  CHEST   Final   Special Requests BOTTLES DRAWN AEROBIC AND ANAEROBIC 5ML   Final   Culture  Setup Time     Final   Value: 06/10/2013 22:25     Performed at [facility]   Culture     Final   Value:        BLOOD CULTURE RECEIVED NO GROWTH TO DATE CULTURE WILL BE HELD FOR 5 DAYS BEFORE ISSUING A FINAL NEGATIVE REPORT     Performed at [facility]   Report Status PENDING   Incomplete  URINE CULTURE     Status: None   Collection Time    06/11/13 12:29 AM      Result Value Ref Range Status   Specimen Description URINE, CLEAN CATCH   Final   Special Requests NONE   Final   Culture  Setup Time     Final   Value: 06/11/2013 04:11     Performed at [facility]     Final   Value: 60,000 COLONIES/ML     Performed at [facility]   Culture     Final   Value: Multiple bacterial morphotypes present, none predominant. Suggest appropriate recollection if clinically indicated.     Performed at [facility]   Report Status 06/12/2013 FINAL   Final     Studies: Nm Pulmonary Perf And Vent  06/10/2013   CLINICAL DATA:  Hypoxia; history of malignancy  EXAM: NUCLEAR MEDICINE VENTILATION - PERFUSION LUNG SCAN  Views: Anterior, posterior, left lateral, right lateral, RPO, LPO, RAO, LAO -ventilation and perfusion  Radionuclide: Technetium 50m DTPA -ventilation; Technetium 67m macroaggregated albumin-perfusion  Dose: 40 0.0 mCi -ventilation; 5.0 mCi-perfusion  Route of administration: Inhalation -ventilation; intravenous-perfusion  COMPARISON:  Chest radiograph June 10, 2013  FINDINGS: Radiotracer uptake on the ventilation study is homogeneous and symmetric bilaterally.  On the perfusion study, there are segmental defects in the lateral and posterior segments of the right lower lobe. Elsewhere, radiotracer uptake is normal on the perfusion study.  IMPRESSION: There are two perfusion defects without corresponding ventilation defects. The chest radiograph does  not show infiltrate in the right base. These findings constitute an overall high probability of pulmonary embolus.  Critical Value/emergent results were called by telephone at the time of interpretation on 06/10/2013 at 7:05 PM to Dr. Pryor Curia , who verbally acknowledged these results.   Electronically Signed   By: Lowella Grip M.D.  On: 06/10/2013 19:08   Dg Abd Acute W/chest  06/10/2013   CLINICAL DATA:  Short of breath, vomiting, on chemotherapy for breast carcinoma  EXAM: ACUTE ABDOMEN SERIES (ABDOMEN 2 VIEW & CHEST 1 VIEW)  COMPARISON:  Chest x-ray of 06/09/2013  FINDINGS: No active infiltrate or effusion is seen. A Port-A-Cath is noted from the left with the tip overlying the mid SVC. The heart is within normal limits in size.  Supine and left lateral decubitus films of the abdomen show a large body habitus. However no bowel obstruction is seen and air is noted to the rectum. No free air is seen in the erect view. There are rounded calcifications in the right upper quadrant consistent with gallstones.  IMPRESSION: 1. No active lung disease. 2. No bowel obstruction or free air. 3. Gallstones in the right upper quadrant   Electronically Signed   By: Ivar Drape M.D.   On: 06/10/2013 16:40    Scheduled Meds: . atorvastatin  10 mg Oral QHS  . cloNIDine  0.2 mg Oral BID  . insulin aspart  0-5 Units Subcutaneous QHS  . insulin aspart  0-9 Units Subcutaneous TID WC  . lactose free nutrition  237 mL Oral Q24H  . potassium chloride  20 mEq Oral Daily  . sodium chloride  3 mL Intravenous Q12H  . traMADol  50 mg Oral BID  . verapamil  240 mg Oral Daily   Continuous Infusions: . heparin 800 Units/hr (06/12/13 0959)   Principal Problem:   Pulmonary embolism Active Problems:   Breast cancer of upper-outer quadrant of right female breast   Edema leg   Nausea and vomiting   Accelerated hypertension  Time spent: 25  This note has been created with Engineer, agricultural. Any transcriptional errors are unintentional.   Marzetta Board, MD [facility] Pager [phone]. If 7 PM - 7 AM, please contact night-coverage at [email or web address], password [facility] 06/12/2013, 11:38 AM  LOS: 2 days

## 2013-06-12 NOTE — Progress Notes (Signed)
ANTICOAGULATION CONSULT NOTE - Follow Up Consult  Pharmacy Consult for Heparin Indication: PE and DVT  No Known Allergies  Patient Measurements: Height: 4\' 10"  (147.3 cm) Weight: 172 lb 6.4 oz (78.2 kg) IBW/kg (Calculated) : 40.9 Heparin Dosing Weight: 63.2 kg  Vital Signs: Temp: 97.9 F (36.6 C) (04/11 0502) Temp src: Oral (04/11 0502) BP: 156/58 mmHg (04/11 0502) Pulse Rate: 63 (04/11 0502)  Labs:  Recent Labs  06/10/13 1446 06/10/13 1550 06/10/13 1959 06/11/13 0437 06/11/13 1420 06/12/13 0535 06/12/13 0744  HGB 10.6*  --   --  9.8*  --  9.3*  --   HCT 32.5*  --   --  29.6*  --  29.0*  --   PLT 209  --   --  210  --  205  --   APTT  --   --  23*  --   --   --   --   LABPROT  --   --  15.3*  --   --   --   --   INR  --   --  1.24  --   --   --   --   HEPARINUNFRC  --   --   --  0.63 0.69  --  0.81*  CREATININE 1.51*  --   --  1.50*  --  1.52*  --   TROPONINI  --  <0.30  --   --   --   --   --     Estimated Creatinine Clearance: 24.3 ml/min (by C-G formula based on Cr of 1.52).   Medications:  Scheduled:  . atorvastatin  10 mg Oral Daily  . cloNIDine  0.2 mg Oral BID  . insulin aspart  0-5 Units Subcutaneous QHS  . insulin aspart  0-9 Units Subcutaneous TID WC  . lactose free nutrition  237 mL Oral Q24H  . potassium chloride  20 mEq Oral Daily  . sodium chloride  3 mL Intravenous Q12H  . traMADol  50 mg Oral BID  . verapamil  240 mg Oral Daily   Infusions:  . heparin 1,000 Units/hr (06/11/13 1733)    Assessment: 80 yo female with breast cancer, on chemotherapy, presents to ED with shortness of breath, nausea and vomiting. VQ scan shows high probability for RLL PE and LE dopplers positive for bilateral DVTs. Pharmacy consulted to dose IV heparin.  Heparin level supratherapeutic this am (0.81) on 800 units/hr  CBC stable, no bleeding reported   Goal of Therapy:  Heparin level 0.3-0.7 units/ml Monitor platelets by anticoagulation protocol: Yes    Plan:   Decrease heparin to 800 units/hr  Recheck heparin level in 8hrs  Follow up plans for long-term anticoagulation  Peggyann Juba, PharmD, BCPS Pager: [phone] 06/12/2013,9:27 AM

## 2013-06-12 NOTE — Progress Notes (Signed)
Echocardiogram 2D Echocardiogram has been performed.  Sabrina [name] 06/12/2013, 8:51 AM

## 2013-06-13 LAB — GLUCOSE, CAPILLARY
Glucose-Capillary: 175 mg/dL — ABNORMAL HIGH (ref 70–99)
Glucose-Capillary: 187 mg/dL — ABNORMAL HIGH (ref 70–99)
Glucose-Capillary: 243 mg/dL — ABNORMAL HIGH (ref 70–99)
Glucose-Capillary: 208 mg/dL — ABNORMAL HIGH (ref 70–99)

## 2013-06-13 LAB — CBC
HCT: 27.8 % — ABNORMAL LOW (ref 36.0–46.0)
Hemoglobin: 9 g/dL — ABNORMAL LOW (ref 12.0–15.0)
MCH: 30.1 pg (ref 26.0–34.0)
MCHC: 32.4 g/dL (ref 30.0–36.0)
MCV: 93 fL (ref 78.0–100.0)
Platelets: 173 10*3/uL (ref 150–400)
RBC: 2.99 MIL/uL — ABNORMAL LOW (ref 3.87–5.11)
RDW: 14.7 % (ref 11.5–15.5)
WBC: 8.5 10*3/uL (ref 4.0–10.5)

## 2013-06-13 LAB — PROTIME-INR
INR: 1.25 (ref 0.00–1.49)
Prothrombin Time: 15.4 seconds — ABNORMAL HIGH (ref 11.6–15.2)

## 2013-06-13 LAB — BASIC METABOLIC PANEL
BUN: 22 mg/dL (ref 6–23)
CO2: 29 mEq/L (ref 19–32)
Calcium: 8.6 mg/dL (ref 8.4–10.5)
Chloride: 101 mEq/L (ref 96–112)
Creatinine, Ser: 1.47 mg/dL — ABNORMAL HIGH (ref 0.50–1.10)
GFR calc Af Amer: 37 mL/min — ABNORMAL LOW (ref >90)
GFR calc non Af Amer: 32 mL/min — ABNORMAL LOW (ref >90)
Glucose, Bld: 172 mg/dL — ABNORMAL HIGH (ref 70–99)
Potassium: 4 mEq/L (ref 3.7–5.3)
Sodium: 137 mEq/L (ref 137–147)

## 2013-06-13 LAB — HEPARIN LEVEL (UNFRACTIONATED): Heparin Unfractionated: 0.33 IU/mL (ref 0.30–0.70)

## 2013-06-13 MED ORDER — WARFARIN VIDEO
Freq: Once | Status: AC
Start: 2013-06-13 — End: 2013-06-13
  Administered 2013-06-13: 10:00:00

## 2013-06-13 MED ORDER — WARFARIN SODIUM 2.5 MG PO TABS
2.5000 mg | ORAL_TABLET | Freq: Once | ORAL | Status: AC
  Administered 2013-06-13: 2.5 mg via ORAL
  Filled 2013-06-13: qty 1

## 2013-06-13 MED ORDER — PATIENT'S GUIDE TO USING COUMADIN BOOK
Freq: Once | Status: AC
  Administered 2013-06-13: 10:00:00
  Filled 2013-06-13: qty 1

## 2013-06-13 MED ORDER — HEPARIN (PORCINE) IN NACL 100-0.45 UNIT/ML-% IJ SOLN
850.0000 [IU]/h | INTRAMUSCULAR | Status: DC
  Administered 2013-06-14: 850 [IU]/h via INTRAVENOUS
  Filled 2013-06-13 (×3): qty 250

## 2013-06-13 NOTE — Progress Notes (Signed)
ANTICOAGULATION CONSULT NOTE - Follow Up  Pharmacy Consult for Warfarin / Heparin Indication: PE and DVT  No Known Allergies  Patient Measurements: Height: 4\' 10"  (147.3 cm) Weight: 172 lb 6.4 oz (78.2 kg) IBW/kg (Calculated) : 40.9 Heparin Dosing Weight: 63.2kg  Vital Signs: Temp: 97.7 F (36.5 C) (04/12 0516) Temp src: Oral (04/12 0516) BP: 141/53 mmHg (04/12 0516) Pulse Rate: 57 (04/12 0516)  Labs:  Recent Labs  06/10/13 1446 06/10/13 1550 06/10/13 1959 06/11/13 0437  06/12/13 0535 06/12/13 0744 06/12/13 1825 06/13/13 0508  HGB 10.6*  --   --  9.8*  --  9.3*  --   --  9.0*  HCT 32.5*  --   --  29.6*  --  29.0*  --   --  27.8*  PLT 209  --   --  210  --  205  --   --  173  APTT  --   --  23*  --   --   --   --   --   --   LABPROT  --   --  15.3*  --   --   --   --   --  15.4*  INR  --   --  1.24  --   --   --   --   --  1.25  HEPARINUNFRC  --   --   --  0.63  < >  --  0.81* 0.40 0.33  CREATININE 1.51*  --   --  1.50*  --  1.52*  --   --  1.47*  TROPONINI  --  <0.30  --   --   --   --   --   --   --   < > = values in this interval not displayed.  Estimated Creatinine Clearance: 25.1 ml/min (by C-G formula based on Cr of 1.47).   Assessment: 80 yo female with breast cancer, on chemotherapy, presents to ED with shortness of breath, nausea and vomiting. VQ scan shows high probability for RLL PE and LE dopplers positive for bilateral DVTs. Pharmacy consulted to dose IV heparin on 4/9, now coumadin added 4/11.  Oncology discussed various anticoagulation options with pt, and warfarin was chosen due to CKD.     D1 of 5 (minimum) warfarin/heparin bridge  CBC trending down slightly, plts ok, no bleeding/complications reported.  Heparin level therapeutic this AM, trending down after rate reduction to 800 units/hr yesterday  INR unchanged as expected after first dose of warfarin 5mg  last night   Goal of Therapy:  INR 2-3 Heparin levels 0.3-0.7 Monitor platelets by  anticoagulation protocol: Yes   Plan:   Increase heparin slightly to 850 units/hr to present further decrease into subtherapeutic range  Continue heparin (or lovenox) for 5 days AND therapeutic INR x 24hrs per CHEST guidelines  Warfarin 2.5mg  PO x 1 today  Daily heparin level, CBC, PT/INR  Provide warfarin education prior to discharge  Peggyann Juba, PharmD, BCPS Pager: [phone] 06/13/2013 7:16 AM

## 2013-06-13 NOTE — Evaluation (Signed)
Physical Therapy Evaluation Patient Details Name: Sabrina [name] MRN: [id number] DOB: [birth year] Today's Date: 06/13/2013   History of Present Illness  Patient is 80 year old female with history of new diagnosis of T1 N1 right breast cancer , following Dr. Humphrey Rolls, status post lumpectomy, on the chemotherapies started in March 2015, chronic renal insufficiency, hypertension, diabetes mellitus presented to the ER for intractable nausea and vomiting since last night and unable to take her medications. She has noticed that she was feeling short of breath and noticed her lower extremity edema right worse than left in the last 1 week.  Clinical Impression  Pt presents with impaired strength, mobility and activity tolerance, will benefit from skilled PT services to address deficits and increase functional independence.    Follow Up Recommendations Home health PT    Equipment Recommendations  Rolling walker with 5" wheels    Recommendations for Other Services       Precautions / Restrictions Precautions Precautions: Fall Restrictions Weight Bearing Restrictions: No      Mobility  Bed Mobility Overal bed mobility: Needs Assistance Bed Mobility: Supine to Sit     Supine to sit: Supervision     General bed mobility comments: increased time, cues for using railings. pt states she sleeps in a chair at home  Transfers Overall transfer level: Needs assistance Equipment used: Rolling walker (2 wheeled) Transfers: Sit to/from Stand Sit to Stand: Min guard         General transfer comment: cues for safe hand placement with RW  Ambulation/Gait Ambulation/Gait assistance: Supervision Ambulation Distance (Feet): 40 Feet Assistive device: Rolling walker (2 wheeled)     Gait velocity interpretation: Below normal speed for age/gender General Gait Details: pt with good safety with obstacle with RW.  discussed using RW vs SPC initially at home for energy conservation, pt  agreeable  Stairs            Wheelchair Mobility    Modified Rankin (Stroke Patients Only)       Balance                                             Pertinent Vitals/Pain No c/o pain, session performed on 2LO2 [place]    Home Living Family/patient expects to be discharged to:: Private residence Living Arrangements: Spouse/significant other Available Help at Discharge: Family;Available 24 hours/day Type of Home: House Home Access: Stairs to enter Entrance Stairs-Rails: Can reach both;Left;Right Entrance Stairs-Number of Steps: 2 Home Layout: One level Home Equipment: Cane - single point      Prior Function Level of Independence: Independent with assistive device(s)         Comments: pt ambulated with SPC in community      Hand Dominance        Extremity/Trunk Assessment   Upper Extremity Assessment: Generalized weakness           Lower Extremity Assessment: Generalized weakness      Cervical / Trunk Assessment: Kyphotic  Communication   Communication: No difficulties  Cognition Arousal/Alertness: Awake/alert Behavior During Therapy: WFL for tasks assessed/performed Overall Cognitive Status: Within Functional Limits for tasks assessed                      General Comments      Exercises        Assessment/Plan    PT Assessment Patient needs  continued PT services  PT Diagnosis Difficulty walking;Generalized weakness   PT Problem List Decreased strength;Decreased activity tolerance;Decreased mobility;Cardiopulmonary status limiting activity  PT Treatment Interventions DME instruction;Balance training;Neuromuscular re-education;Gait training;Stair training;Functional mobility training;Patient/family education;Wheelchair mobility training;Therapeutic activities;Therapeutic exercise   PT Goals (Current goals can be found in the Care Plan section) Acute Rehab PT Goals Patient Stated Goal: go home PT Goal Formulation:  With patient Time For Goal Achievement: 06/27/13 Potential to Achieve Goals: Good    Frequency Min 3X/week   Barriers to discharge        Co-evaluation               End of Session Equipment Utilized During Treatment: Gait belt;Oxygen Activity Tolerance: Patient tolerated treatment well Patient left: in chair;with call bell/phone within reach Nurse Communication: Mobility status         Time: 0950-1010 PT Time Calculation (min): 20 min   Charges:   PT Evaluation $Initial PT Evaluation Tier I: 1 Procedure PT Treatments $Therapeutic Activity: 8-22 mins   PT G Codes:          Kennith Gain 06/13/2013, 12:32 PM

## 2013-06-13 NOTE — Discharge Instructions (Signed)
Information on my medicine - Coumadin   (Warfarin)  This medication education was reviewed with me or my healthcare representative as part of my discharge preparation.  The pharmacist that spoke with me during my hospital stay was:  Minda Ditto, [facility]  Why was Coumadin prescribed for you? Coumadin was prescribed for you because you have a blood clot or a medical condition that can cause an increased risk of forming blood clots. Blood clots can cause serious health problems by blocking the flow of blood to the heart, lung, or brain. Coumadin can prevent harmful blood clots from forming. As a reminder your indication for Coumadin is:   Pulmonary Embolism Treatment  What test will check on my response to Coumadin? While on Coumadin (warfarin) you will need to have an INR test regularly to ensure that your dose is keeping you in the desired range. The INR (international normalized ratio) number is calculated from the result of the laboratory test called prothrombin time (PT).  If an INR APPOINTMENT HAS NOT ALREADY BEEN MADE FOR YOU please schedule an appointment to have this lab work done by your health care provider within 7 days. Your INR goal is usually a number between:  2 to 3 or your provider may give you a more narrow range like 2-2.5.  Ask your health care provider during an office visit what your goal INR is.  What  do you need to  know  About  COUMADIN? Take Coumadin (warfarin) exactly as prescribed by your healthcare provider about the same time each day.  DO NOT stop taking without talking to the doctor who prescribed the medication.  Stopping without other blood clot prevention medication to take the place of Coumadin may increase your risk of developing a new clot or stroke.  Get refills before you run out.  What do you do if you miss a dose? If you miss a dose, take it as soon as you remember on the same day then continue your regularly scheduled regimen the next day.  Do not take two  doses of Coumadin at the same time.  Important Safety Information A possible side effect of Coumadin (Warfarin) is an increased risk of bleeding. You should call your healthcare provider right away if you experience any of the following:   Bleeding from an injury or your nose that does not stop.   Unusual colored urine (red or dark brown) or unusual colored stools (red or black).   Unusual bruising for unknown reasons.   A serious fall or if you hit your head (even if there is no bleeding).  Some foods or medicines interact with Coumadin (warfarin) and might alter your response to warfarin. To help avoid this:   Eat a balanced diet, maintaining a consistent amount of Vitamin K.   Notify your provider about major diet changes you plan to make.   Avoid alcohol or limit your intake to 1 drink for women and 2 drinks for men per day. (1 drink is 5 oz. wine, 12 oz. beer, or 1.5 oz. liquor.)  Make sure that ANY health care provider who prescribes medication for you knows that you are taking Coumadin (warfarin).  Also make sure the healthcare provider who is monitoring your Coumadin knows when you have started a new medication including herbals and non-prescription products.  Coumadin (Warfarin)  Major Drug Interactions  Increased Warfarin Effect Decreased Warfarin Effect  Alcohol (large quantities) Antibiotics (esp. Septra/Bactrim, Flagyl, Cipro) Amiodarone (Cordarone) Aspirin (ASA) Cimetidine (Tagamet)  Megestrol (Megace) NSAIDs (ibuprofen, naproxen, etc.) Piroxicam (Feldene) Propafenone (Rythmol SR) Propranolol (Inderal) Isoniazid (INH) Posaconazole (Noxafil) Barbiturates (Phenobarbital) Carbamazepine (Tegretol) Chlordiazepoxide (Librium) Cholestyramine (Questran) Griseofulvin Oral Contraceptives Rifampin Sucralfate (Carafate) Vitamin K   Coumadin (Warfarin) Major Herbal Interactions  Increased Warfarin Effect Decreased Warfarin Effect  Garlic Ginseng Ginkgo biloba Coenzyme  Q10 Sanuel Ladnier tea [place] wort    Coumadin (Warfarin) FOOD Interactions  Eat a consistent number of servings per week of foods HIGH in Vitamin K (1 serving =  cup)  Collards (cooked, or boiled & drained) Kale (cooked, or boiled & drained) Mustard greens (cooked, or boiled & drained) Parsley *serving size only =  cup Spinach (cooked, or boiled & drained) Swiss chard (cooked, or boiled & drained) Turnip greens (cooked, or boiled & drained)  Eat a consistent number of servings per week of foods MEDIUM-HIGH in Vitamin K (1 serving = 1 cup)  Asparagus (cooked, or boiled & drained) Broccoli (cooked, boiled & drained, or raw & chopped) Brussel sprouts (cooked, or boiled & drained) *serving size only =  cup Lettuce, raw (Allexis Bordenave leaf, endive, romaine) Spinach, raw Turnip greens, raw & chopped   These websites have more information on Coumadin (warfarin):  FailFactory.se; VeganReport.com.au;  [facility] Pharmacy phone number [phone]

## 2013-06-13 NOTE — Progress Notes (Signed)
PROGRESS NOTE  Sabrina [name] [id number] DOB: 09/21/1926 DOA: 06/10/2013 PCP: [name], MD  Assessment/Plan:  Pulmonary embolism - on heparin gtt, stable on nasal cannula. LE [place] positive for DVT. Hematology consulted, appreciate input.  - started Coumadin 4/11, continue to overlap with heparin drip until INR therapeutic - 2D echo as below. No major changes since her previous 2-D echo.  Breast cancer of upper-outer quadrant of right female breast - she is being followed by Dr. Humphrey Rolls and will follow up as an outpatient.   Nausea and vomiting: No abdominal pain  - Currently improving, able to tolerate a diet  Accelerated hypertension - better  Diet: carb modified Fluids: none DVT Prophylaxis: heparin  Code Status: Full Family Communication: husband bedside  Disposition Plan: inpatient  Consultants:  Oncology   Procedures:  2D echo Study Conclusions - Left ventricle: The cavity size was normal. There was moderate concentric hypertrophy. Systolic function was vigorous. The estimated ejection fraction was in the range of 65% to 70%. Wall motion was normal; there were no regional wall motion abnormalities. Doppler parameters are consistent with abnormal left ventricular relaxation (grade 1 diastolic dysfunction). Doppler parameters are consistent with elevated ventricular end-diastolic filling pressure. - Aortic valve: Trileaflet; moderately thickened, moderately calcified leaflets. Cusp separation was moderately reduced. There was mild stenosis. Valve area: 1.49cm^2 (Vmax). - Aortic root: The aortic root was normal in size. - Mitral valve: Severe miatral annular calcification involving part of the leaflets. No regurgitation. - Left atrium: The atrium was moderately dilated. - Right ventricle: Systolic function was normal. - Pulmonic valve: Mild regurgitation. - Pulmonary arteries: Systolic pressure was within the normal range. - Pericardium, extracardiac: There was no  pericardial effusion. Impressions:  - When compared to the prior study from 04/22/2013 there is no significant change.   Antibiotics - none  HPI/Subjective: - feeling better this morning, glad that she worked with PT  Objective: Filed Vitals:   06/12/13 0502 06/12/13 1329 06/12/13 2059 06/13/13 0516  BP: 156/58 134/43 128/55 141/53  Pulse: 63 72 58 57  Temp: 97.9 F (36.6 C) 97.5 F (36.4 C) 97.3 F (36.3 C) 97.7 F (36.5 C)  TempSrc: Oral Oral Oral Oral  Resp: 20 18 18 18   Height:      Weight:      SpO2: 99% 100% 100% 100%    Intake/Output Summary (Last 24 hours) at 06/13/13 0704 Last data filed at 06/13/13 0600  Gross per 24 hour  Intake    992 ml  Output   1250 ml  Net   -258 ml   Filed Weights   06/10/13 2113  Weight: 78.2 kg (172 lb 6.4 oz)   Exam:  General:  NAD  Cardiovascular: regular rate and rhythm, without MRG  Respiratory: good air movement, clear to auscultation throughout, no wheezing, ronchi or rales  Abdomen: soft, not tender to palpation, positive bowel sounds  MSK: trace bilateral LE edema, R>L  Neuro: non focal  Data Reviewed: Basic Metabolic Panel:  Recent Labs Lab 06/07/13 1215 06/10/13 1446 06/11/13 0437 06/12/13 0535 06/13/13 0508  NA 140 141 142 140 137  K 4.0 3.5* 4.0 3.7 4.0  CL 92* 96 99 101 101  CO2 30 30 31 30 29   GLUCOSE 337* 168* 198* 161* 172*  BUN 47* 37* 29* 23 22  CREATININE 1.83* 1.51* 1.50* 1.52* 1.47*  CALCIUM 9.8 9.3 8.4 8.9 8.6   Liver Function Tests:  Recent Labs Lab 06/07/13 1215 06/10/13 1446  AST 15 14  ALT 32 25  ALKPHOS 99 94  BILITOT 0.5 0.5  PROT 7.0 6.5  ALBUMIN 3.7 3.4*    Recent Labs Lab 06/07/13 1215 06/10/13 1446  LIPASE 91* 76*   CBC:  Recent Labs Lab 06/07/13 0954 06/07/13 1215 06/10/13 1446 06/11/13 0437 06/12/13 0535 06/13/13 0508  WBC 19.9* 17.3* 14.8* 12.6* 10.6* 8.5  NEUTROABS 14.8* 14.2* 11.6*  --   --   --   HGB 11.5* 11.2* 10.6* 9.8* 9.3* 9.0*  HCT 34.9  34.1* 32.5* 29.6* 29.0* 27.8*  MCV 91.1 90.9 91.0 90.8 92.7 93.0  PLT 317 272 209 210 205 173   Cardiac Enzymes:  Recent Labs Lab 06/07/13 1215 06/10/13 1550  TROPONINI <0.30 <0.30   BNP (last 3 results)  Recent Labs  06/07/13 1215  PROBNP 557.1*   CBG:  Recent Labs Lab 06/11/13 2121 06/12/13 0753 06/12/13 1137 06/12/13 1655 06/12/13 2057  GLUCAP 201* 163* 190* 233* 207*    Recent Results (from the past 240 hour(s))  CULTURE, BLOOD (ROUTINE X 2)     Status: None   Collection Time    06/10/13  3:50 PM      Result Value Ref Range Status   Specimen Description BLOOD LEFT ANTECUBITAL   Final   Special Requests BOTTLES DRAWN AEROBIC AND ANAEROBIC 5CC   Final   Culture  Setup Time     Final   Value: 06/10/2013 22:25     Performed at [facility]   Culture     Final   Value:        BLOOD CULTURE RECEIVED NO GROWTH TO DATE CULTURE WILL BE HELD FOR 5 DAYS BEFORE ISSUING A FINAL NEGATIVE REPORT     Performed at [facility]   Report Status PENDING   Incomplete  CULTURE, BLOOD (ROUTINE X 2)     Status: None   Collection Time    06/10/13  4:08 PM      Result Value Ref Range Status   Specimen Description BLOOD L CHEST   Final   Special Requests BOTTLES DRAWN AEROBIC AND ANAEROBIC 5ML   Final   Culture  Setup Time     Final   Value: 06/10/2013 22:25     Performed at [facility]   Culture     Final   Value:        BLOOD CULTURE RECEIVED NO GROWTH TO DATE CULTURE WILL BE HELD FOR 5 DAYS BEFORE ISSUING A FINAL NEGATIVE REPORT     Performed at [facility]   Report Status PENDING   Incomplete  URINE CULTURE     Status: None   Collection Time    06/11/13 12:29 AM      Result Value Ref Range Status   Specimen Description URINE, CLEAN CATCH   Final   Special Requests NONE   Final   Culture  Setup Time     Final   Value: 06/11/2013 04:11     Performed at [facility]     Final   Value: 60,000 COLONIES/ML      Performed at [facility]   Culture     Final   Value: Multiple bacterial morphotypes present, none predominant. Suggest appropriate recollection if clinically indicated.     Performed at [facility]   Report Status 06/12/2013 FINAL   Final     Studies: No results found.  Scheduled Meds: . atorvastatin  10 mg Oral QHS  .  cloNIDine  0.2 mg Oral BID  . insulin aspart  0-5 Units Subcutaneous QHS  . insulin aspart  0-9 Units Subcutaneous TID WC  . lactose free nutrition  237 mL Oral Q24H  . potassium chloride  20 mEq Oral Daily  . sodium chloride  3 mL Intravenous Q12H  . traMADol  50 mg Oral BID  . verapamil  240 mg Oral Daily  . Warfarin - Pharmacist Dosing Inpatient   Does not apply q1800   Continuous Infusions: . heparin 800 Units/hr (06/13/13 0600)   Principal Problem:   Pulmonary embolism Active Problems:   Breast cancer of upper-outer quadrant of right female breast   Edema leg   Nausea and vomiting   Accelerated hypertension  Time spent: 25  This note has been created with Surveyor, quantity. Any transcriptional errors are unintentional.   Marzetta Board, MD [facility] Pager [phone]. If 7 PM - 7 AM, please contact night-coverage at [email or web address], password [email or web address] 06/13/2013, 7:04 AM  LOS: 3 days

## 2013-06-14 ENCOUNTER — Ambulatory Visit: Payer: Medicare Other

## 2013-06-14 ENCOUNTER — Other Ambulatory Visit: Payer: Self-pay | Admitting: *Deleted

## 2013-06-14 ENCOUNTER — Other Ambulatory Visit: Payer: Medicare Other

## 2013-06-14 ENCOUNTER — Ambulatory Visit: Payer: Medicare Other | Admitting: Adult Health

## 2013-06-14 ENCOUNTER — Encounter: Payer: Medicare Other | Admitting: Nutrition

## 2013-06-14 DIAGNOSIS — J9601 Acute respiratory failure with hypoxia: Secondary | ICD-10-CM | POA: Diagnosis present

## 2013-06-14 LAB — GLUCOSE, CAPILLARY
Glucose-Capillary: 162 mg/dL — ABNORMAL HIGH (ref 70–99)
Glucose-Capillary: 223 mg/dL — ABNORMAL HIGH (ref 70–99)
Glucose-Capillary: 149 mg/dL — ABNORMAL HIGH (ref 70–99)
Glucose-Capillary: 171 mg/dL — ABNORMAL HIGH (ref 70–99)

## 2013-06-14 LAB — CBC
HCT: 29.3 % — ABNORMAL LOW (ref 36.0–46.0)
Hemoglobin: 9.4 g/dL — ABNORMAL LOW (ref 12.0–15.0)
MCH: 29.8 pg (ref 26.0–34.0)
MCHC: 32.1 g/dL (ref 30.0–36.0)
MCV: 93 fL (ref 78.0–100.0)
Platelets: 181 10*3/uL (ref 150–400)
RBC: 3.15 MIL/uL — ABNORMAL LOW (ref 3.87–5.11)
RDW: 14.8 % (ref 11.5–15.5)
WBC: 8.9 10*3/uL (ref 4.0–10.5)

## 2013-06-14 LAB — HEPARIN LEVEL (UNFRACTIONATED)
Heparin Unfractionated: 0.19 IU/mL — ABNORMAL LOW (ref 0.30–0.70)
Heparin Unfractionated: 0.45 IU/mL (ref 0.30–0.70)

## 2013-06-14 LAB — PROTIME-INR
INR: 1.4 (ref 0.00–1.49)
Prothrombin Time: 16.8 seconds — ABNORMAL HIGH (ref 11.6–15.2)

## 2013-06-14 MED ORDER — HEPARIN (PORCINE) IN NACL 100-0.45 UNIT/ML-% IJ SOLN
1000.0000 [IU]/h | INTRAMUSCULAR | Status: DC
  Administered 2013-06-15 – 2013-06-17 (×3): 1000 [IU]/h via INTRAVENOUS
  Filled 2013-06-14 (×6): qty 250

## 2013-06-14 MED ORDER — WARFARIN SODIUM 2.5 MG PO TABS
2.5000 mg | ORAL_TABLET | Freq: Once | ORAL | Status: AC
  Administered 2013-06-14: 2.5 mg via ORAL
  Filled 2013-06-14: qty 1

## 2013-06-14 NOTE — Progress Notes (Signed)
ANTICOAGULATION CONSULT NOTE - Follow Up  Pharmacy Consult for Warfarin / Heparin Indication: PE and DVT  No Known Allergies  Patient Measurements: Height: 4\' 10"  (147.3 cm) Weight: 172 lb 6.4 oz (78.2 kg) IBW/kg (Calculated) : 40.9 Heparin Dosing Weight: 63.2kg  Vital Signs: Temp: 97.4 F (36.3 C) (04/13 0422) Temp src: Oral (04/13 0422) BP: 138/53 mmHg (04/13 0422) Pulse Rate: 56 (04/13 0422)  Labs:  Recent Labs  06/12/13 0535  06/12/13 1825 06/13/13 0508 06/14/13 0442  HGB 9.3*  --   --  9.0*  --   HCT 29.0*  --   --  27.8*  --   PLT 205  --   --  173  --   LABPROT  --   --   --  15.4*  --   INR  --   --   --  1.25  --   HEPARINUNFRC  --   < > 0.40 0.33 0.19*  CREATININE 1.52*  --   --  1.47*  --   < > = values in this interval not displayed.  Estimated Creatinine Clearance: 25.1 ml/min (by C-G formula based on Cr of 1.47).  Warfarin doses: 4/11 5mg , 4/12 2.5mg   Assessment: 80 yo female with breast cancer, on chemotherapy, presents to ED with shortness of breath, nausea and vomiting. VQ scan shows high probability for RLL PE and LE dopplers positive for bilateral DVTs. Pharmacy consulted to dose IV heparin on 4/9, now coumadin added 4/11.  Oncology discussed various anticoagulation options with pt, and warfarin was chosen due to CKD.     4/13:  Day 3 of 5 (minimum) warfarin/heparin bridge  CBC:  Hgb 9.4, Plt 181.  Remains fairly stable.  No bleeding/complications reported.  SCr (1.47) yesterday with CrCl ~ 25 ml/min  Heparin level (0.19) sub-therapeutic this AM, decreased despite small rate increase yesterday.  INR 1.4, remains subtherapeutic but increasing.  Warfarin education by pharmacist completed 4/12.  Diet: carb modified.  Pt continues to have N/V with emesis x1 last night, but appears to be eating 25-75% of meals.  Goal of Therapy:  INR 2-3 Heparin level 0.3-0.7 units/ml Monitor platelets by anticoagulation protocol: Yes   Plan:   Increase  heparin to 1000 units/hr   Heparin level 8 hours after rate change.  Continue heparin (or lovenox) for 5 days AND therapeutic INR x 24hrs per CHEST guidelines  Warfarin 2.5 mg PO x 1 today  Daily heparin level, CBC, PT/INR   Gretta Arab PharmD, BCPS Pager [phone] 06/14/2013 7:14 AM

## 2013-06-14 NOTE — Progress Notes (Signed)
Physical Therapy Treatment Patient Details Name: Sabrina [name] MRN: [id number] DOB: 1926-11-17 Today's Date: 06/14/2013    History of Present Illness Patient is 80 year old female with history of new diagnosis of T1 N1 right breast cancer , following Dr. Humphrey Rolls, status post lumpectomy, on the chemotherapies started in March 2015, chronic renal insufficiency, hypertension, diabetes mellitus presented to the ER for intractable nausea and vomiting since last night and unable to take her medications. She has noticed that she was feeling short of breath and noticed her lower extremity edema right worse than left in the last 1 week.    PT Comments    *Pt progressing well with mobility, increased gait distance to 130' today. SaO2 98% on RA with walking.  0/10 pain. Gait is slow 2* deconditioning, but overall is improving. **  Follow Up Recommendations  Home health PT     Equipment Recommendations  Rolling walker with 5" wheels    Recommendations for Other Services       Precautions / Restrictions Precautions Precautions: Fall Restrictions Weight Bearing Restrictions: No    Mobility  Bed Mobility Overal bed mobility: Modified Independent Bed Mobility: Supine to Sit     Supine to sit: Modified independent (Device/Increase time)     General bed mobility comments: used rail, HOB elevated 30*  Transfers Overall transfer level: Needs assistance Equipment used: Rolling walker (2 wheeled) Transfers: Sit to/from Stand Sit to Stand: Min guard         General transfer comment: cues for safe hand placement with RW  Ambulation/Gait Ambulation/Gait assistance: Supervision Ambulation Distance (Feet): 130 Feet Assistive device: Rolling walker (2 wheeled) Gait Pattern/deviations: Decreased stride length Gait velocity: decreased Gait velocity interpretation: Below normal speed for age/gender General Gait Details: pt with good safety with obstacle with RW.  discussed using RW vs SPC  initially at home for energy conservation, pt agreeable; distance limited by fatigue   Stairs            Wheelchair Mobility    Modified Rankin (Stroke Patients Only)       Balance Overall balance assessment: Needs assistance Sitting-balance support: No upper extremity supported;Feet supported Sitting balance-Leahy Scale: Good       Standing balance-Leahy Scale: Fair                      Cognition Arousal/Alertness: Awake/alert Behavior During Therapy: WFL for tasks assessed/performed Overall Cognitive Status: Within Functional Limits for tasks assessed                      Exercises General Exercises - Lower Extremity Ankle Circles/Pumps: AROM;Both;20 reps;Seated Quad Sets: AROM;Both;5 reps;Supine Heel Slides: AROM;Both;5 reps;Supine Hip ABduction/ADduction: AROM;Both;5 reps;Supine    General Comments        Pertinent Vitals/Pain **SaO2 98% on RA walking 0/10 pain*    Home Living                      Prior Function            PT Goals (current goals can now be found in the care plan section) Acute Rehab PT Goals Patient Stated Goal: go home PT Goal Formulation: With patient Time For Goal Achievement: 06/27/13 Potential to Achieve Goals: Good Progress towards PT goals: Progressing toward goals    Frequency  Min 3X/week    PT Plan Current plan remains appropriate    Co-evaluation  End of Session Equipment Utilized During Treatment: Gait belt Activity Tolerance: Patient tolerated treatment well Patient left: in chair;with call bell/phone within reach;with family/visitor present     Time: 1002-1040 PT Time Calculation (min): 38 min  Charges:  $Gait Training: 8-22 mins $Therapeutic Exercise: 8-22 mins $Therapeutic Activity: 8-22 mins                    G Codes:      [name] 06/14/2013, 10:48 AM [id number]

## 2013-06-14 NOTE — Progress Notes (Signed)
Inpatient Diabetes Program Recommendations  AACE/ADA: New Consensus Statement on Inpatient Glycemic Control (2013)  Target Ranges:  Prepandial:   less than 140 mg/dL      Peak postprandial:   less than 180 mg/dL (1-2 hours)      Critically ill patients:  140 - 180 mg/dL   Reason for Visit: Results for [place], [place] (MRN [id number]) as of 06/14/2013 09:20  Ref. Range 06/13/2013 17:35 06/13/2013 21:03 06/14/2013 04:42 06/14/2013 07:47  Glucose-Capillary Latest Range: 70-99 mg/dL 243 (H) 208 (H)  162 (H)    Diabetes history: Type 2 diabetes Outpatient Diabetes medications: Metformin 850 mg bid Current orders for Inpatient glycemic control: Novolog sensitive tid with meals  Consider increasing Novolog correction to moderate tid with meals.   Thanks, Adah Perl, RN, BC-ADM Inpatient Diabetes Coordinator Pager [phone]

## 2013-06-14 NOTE — Progress Notes (Addendum)
PHARMACY - BRIEF NOTE  Heparin for PE and DVT Day #3 of 5 minimum overlap  Heparin level check = 0.45 (goal 0.3 - 0.7) at 1000 units/hr Rate increased this am for low heparin level of 0.19 on 850 units/hr  Plan:  Continue current rate of 1000 units/hr based on most current level  Follow-up with CBC and heparin level with am labs  Coumadin dose as previously ordered from earlier today  Doreene Eland, PharmD, BCPS.   Pager: [phone] 06/14/2013 4:50 PM

## 2013-06-14 NOTE — Progress Notes (Signed)
PROGRESS NOTE   Sabrina [name] [id number] DOB: 1926/06/06 DOA: 06/10/2013 PCP: [name], MD  Assessment/Plan:  Pulmonary embolism - on heparin gtt, stable on nasal cannula. LE [place] positive for DVT. Hematology consulted, appreciate input.  - started Coumadin 4/11, continue to overlap with heparin drip until INR therapeutic - 2D echo as below. No major changes since her previous 2-D echo.  Hypoxic respiratory failure - due to #1. Wean off oxygen to room air as tolerated.  Breast cancer of upper-outer quadrant of right female breast - she is being followed by Dr. Humphrey Rolls and will follow up as an outpatient.   Nausea and vomiting: No abdominal pain, Currently improving, able to tolerate a diet. He did have one episode of emesis last night, but tolerated breakfast well this morning.  Accelerated hypertension - better  Diet: carb modified Fluids: none DVT Prophylaxis: heparin/Coumadin  Code Status: Full Family Communication: husband bedside  Disposition Plan: inpatient: Home when INR therapeutic,   Consultants:  Oncology   Procedures:  2D echo Study Conclusions - Left ventricle: The cavity size was normal. There was moderate concentric hypertrophy. Systolic function was vigorous. The estimated ejection fraction was in the range of 65% to 70%. Wall motion was normal; there were no regional wall motion abnormalities. Doppler parameters are consistent with abnormal left ventricular relaxation (grade 1 diastolic dysfunction). Doppler parameters are consistent with elevated ventricular end-diastolic filling pressure. - Aortic valve: Trileaflet; moderately thickened, moderately calcified leaflets. Cusp separation was moderately reduced. There was mild stenosis. Valve area: 1.49cm^2 (Vmax). - Aortic root: The aortic root was normal in size. - Mitral valve: Severe miatral annular calcification involving part of the leaflets. No regurgitation. - Left atrium: The atrium was moderately  dilated. - Right ventricle: Systolic function was normal. - Pulmonic valve: Mild regurgitation. - Pulmonary arteries: Systolic pressure was within the normal range. - Pericardium, extracardiac: There was no pericardial effusion. Impressions:  - When compared to the prior study from 04/22/2013 there is no significant change.   Antibiotics - none  HPI/Subjective: - feeling better this morning,   Objective: Filed Vitals:   06/13/13 0516 06/13/13 1449 06/13/13 2026 06/14/13 0422  BP: 141/53 141/45 141/50 138/53  Pulse: 57 64 58 56  Temp: 97.7 F (36.5 C) 97.4 F (36.3 C) 98.1 F (36.7 C) 97.4 F (36.3 C)  TempSrc: Oral Oral Oral Oral  Resp: 18 16 17    Height:      Weight:      SpO2: 100% 100% 100% 100%    Intake/Output Summary (Last 24 hours) at 06/14/13 0921 Last data filed at 06/14/13 [id number]  Gross per 24 hour  Intake    685 ml  Output   1050 ml  Net   -365 ml   Filed Weights   06/10/13 2113  Weight: 78.2 kg (172 lb 6.4 oz)   Exam:  General:  NAD  Cardiovascular: regular rate and rhythm, without MRG  Respiratory: good air movement, clear to auscultation throughout, no wheezing, ronchi or rales  Abdomen: soft, not tender to palpation, positive bowel sounds  MSK: trace bilateral LE edema, R>L  Neuro: non focal  Data Reviewed: Basic Metabolic Panel:  Recent Labs Lab 06/07/13 1215 06/10/13 1446 06/11/13 0437 06/12/13 0535 06/13/13 0508  NA 140 141 142 140 137  K 4.0 3.5* 4.0 3.7 4.0  CL 92* 96 99 101 101  CO2 30 30 31 30 29   GLUCOSE 337* 168* 198* 161* 172*  BUN 47* 37* 29* 23  22  CREATININE 1.83* 1.51* 1.50* 1.52* 1.47*  CALCIUM 9.8 9.3 8.4 8.9 8.6   Liver Function Tests:  Recent Labs Lab 06/07/13 1215 06/10/13 1446  AST 15 14  ALT 32 25  ALKPHOS 99 94  BILITOT 0.5 0.5  PROT 7.0 6.5  ALBUMIN 3.7 3.4*    Recent Labs Lab 06/07/13 1215 06/10/13 1446  LIPASE 91* 76*   CBC:  Recent Labs Lab 06/07/13 0954  06/07/13 1215 06/10/13 1446  06/11/13 0437 06/12/13 0535 06/13/13 0508 06/14/13 0755  WBC 19.9*  < > 17.3* 14.8* 12.6* 10.6* 8.5 8.9  NEUTROABS 14.8*  --  14.2* 11.6*  --   --   --   --   HGB 11.5*  < > 11.2* 10.6* 9.8* 9.3* 9.0* 9.4*  HCT 34.9  < > 34.1* 32.5* 29.6* 29.0* 27.8* 29.3*  MCV 91.1  < > 90.9 91.0 90.8 92.7 93.0 93.0  PLT 317  < > 272 209 210 205 173 181  < > = values in this interval not displayed. Cardiac Enzymes:  Recent Labs Lab 06/07/13 1215 06/10/13 1550  TROPONINI <0.30 <0.30   BNP (last 3 results)  Recent Labs  06/07/13 1215  PROBNP 557.1*   CBG:  Recent Labs Lab 06/13/13 0801 06/13/13 1207 06/13/13 1735 06/13/13 2103 06/14/13 0747  GLUCAP 175* 187* 243* 208* 162*    Recent Results (from the past 240 hour(s))  CULTURE, BLOOD (ROUTINE X 2)     Status: None   Collection Time    06/10/13  3:50 PM      Result Value Ref Range Status   Specimen Description BLOOD LEFT ANTECUBITAL   Final   Special Requests BOTTLES DRAWN AEROBIC AND ANAEROBIC 5CC   Final   Culture  Setup Time     Final   Value: 06/10/2013 22:25     Performed at [facility]   Culture     Final   Value:        BLOOD CULTURE RECEIVED NO GROWTH TO DATE CULTURE WILL BE HELD FOR 5 DAYS BEFORE ISSUING A FINAL NEGATIVE REPORT     Performed at [facility]   Report Status PENDING   Incomplete  CULTURE, BLOOD (ROUTINE X 2)     Status: None   Collection Time    06/10/13  4:08 PM      Result Value Ref Range Status   Specimen Description BLOOD L CHEST   Final   Special Requests BOTTLES DRAWN AEROBIC AND ANAEROBIC 5ML   Final   Culture  Setup Time     Final   Value: 06/10/2013 22:25     Performed at [facility]   Culture     Final   Value:        BLOOD CULTURE RECEIVED NO GROWTH TO DATE CULTURE WILL BE HELD FOR 5 DAYS BEFORE ISSUING A FINAL NEGATIVE REPORT     Performed at [facility]   Report Status PENDING   Incomplete  URINE CULTURE     Status: None   Collection Time     06/11/13 12:29 AM      Result Value Ref Range Status   Specimen Description URINE, CLEAN CATCH   Final   Special Requests NONE   Final   Culture  Setup Time     Final   Value: 06/11/2013 04:11     Performed at [facility]     Final   Value: 60,000  COLONIES/ML     Performed at [facility]     Final   Value: Multiple bacterial morphotypes present, none predominant. Suggest appropriate recollection if clinically indicated.     Performed at [facility]   Report Status 06/12/2013 FINAL   Final     Studies: No results found.  Scheduled Meds: . atorvastatin  10 mg Oral QHS  . cloNIDine  0.2 mg Oral BID  . insulin aspart  0-5 Units Subcutaneous QHS  . insulin aspart  0-9 Units Subcutaneous TID WC  . lactose free nutrition  237 mL Oral Q24H  . potassium chloride  20 mEq Oral Daily  . sodium chloride  3 mL Intravenous Q12H  . traMADol  50 mg Oral BID  . verapamil  240 mg Oral Daily  . Warfarin - Pharmacist Dosing Inpatient   Does not apply q1800   Continuous Infusions: . heparin 1,000 Units/hr (06/14/13 0804)   Principal Problem:   Pulmonary embolism Active Problems:   Breast cancer of upper-outer quadrant of right female breast   Edema leg   Nausea and vomiting   Accelerated hypertension  Time spent: 25  This note has been created with Surveyor, quantity. Any transcriptional errors are unintentional.   Marzetta Board, MD [facility] Pager [phone]. If 7 PM - 7 AM, please contact night-coverage at [email or web address], password [email or web address] 06/14/2013, 9:21 AM  LOS: 4 days

## 2013-06-15 ENCOUNTER — Other Ambulatory Visit: Payer: Self-pay | Admitting: *Deleted

## 2013-06-15 LAB — BASIC METABOLIC PANEL
BUN: 18 mg/dL (ref 6–23)
CO2: 25 mEq/L (ref 19–32)
Calcium: 8.9 mg/dL (ref 8.4–10.5)
Chloride: 102 mEq/L (ref 96–112)
Creatinine, Ser: 1.36 mg/dL — ABNORMAL HIGH (ref 0.50–1.10)
GFR calc Af Amer: 40 mL/min — ABNORMAL LOW (ref >90)
GFR calc non Af Amer: 35 mL/min — ABNORMAL LOW (ref >90)
Glucose, Bld: 197 mg/dL — ABNORMAL HIGH (ref 70–99)
Potassium: 4.6 mEq/L (ref 3.7–5.3)
Sodium: 137 mEq/L (ref 137–147)

## 2013-06-15 LAB — CBC
HCT: 29.2 % — ABNORMAL LOW (ref 36.0–46.0)
Hemoglobin: 9.5 g/dL — ABNORMAL LOW (ref 12.0–15.0)
MCH: 30.2 pg (ref 26.0–34.0)
MCHC: 32.5 g/dL (ref 30.0–36.0)
MCV: 92.7 fL (ref 78.0–100.0)
Platelets: 187 10*3/uL (ref 150–400)
RBC: 3.15 MIL/uL — ABNORMAL LOW (ref 3.87–5.11)
RDW: 15 % (ref 11.5–15.5)
WBC: 9.9 10*3/uL (ref 4.0–10.5)

## 2013-06-15 LAB — GLUCOSE, CAPILLARY
Glucose-Capillary: 168 mg/dL — ABNORMAL HIGH (ref 70–99)
Glucose-Capillary: 176 mg/dL — ABNORMAL HIGH (ref 70–99)
Glucose-Capillary: 151 mg/dL — ABNORMAL HIGH (ref 70–99)
Glucose-Capillary: 210 mg/dL — ABNORMAL HIGH (ref 70–99)

## 2013-06-15 LAB — HEPARIN LEVEL (UNFRACTIONATED): Heparin Unfractionated: 0.38 IU/mL (ref 0.30–0.70)

## 2013-06-15 LAB — PROTIME-INR
INR: 1.5 — ABNORMAL HIGH (ref 0.00–1.49)
Prothrombin Time: 17.7 seconds — ABNORMAL HIGH (ref 11.6–15.2)

## 2013-06-15 MED ORDER — WARFARIN SODIUM 3 MG PO TABS
3.5000 mg | ORAL_TABLET | Freq: Once | ORAL | Status: AC
  Administered 2013-06-15: 3.5 mg via ORAL
  Filled 2013-06-15: qty 1

## 2013-06-15 MED ORDER — WARFARIN SODIUM 3 MG PO TABS
3.0000 mg | ORAL_TABLET | Freq: Once | ORAL | Status: DC
Start: 2013-06-15 — End: 2013-06-15
  Filled 2013-06-15: qty 1

## 2013-06-15 MED ORDER — SENNOSIDES-DOCUSATE SODIUM 8.6-50 MG PO TABS: 1.0000 | ORAL_TABLET | Freq: Every evening | ORAL | Status: DC | PRN

## 2013-06-15 MED ORDER — SENNOSIDES-DOCUSATE SODIUM 8.6-50 MG PO TABS
2.0000 | ORAL_TABLET | Freq: Once | ORAL | Status: AC
  Administered 2013-06-15: 2 via ORAL
  Filled 2013-06-15: qty 2

## 2013-06-15 NOTE — Progress Notes (Signed)
PROGRESS NOTE   Sabrina [name] [id number] DOB: 1926-10-11 DOA: 06/10/2013 PCP: [name], MD  HPI Patient is 80 year old female with history of new diagnosis of T1 N1 right breast cancer , following Dr. Humphrey Rolls, status post lumpectomy, on the chemotherapies started in March 2015, chronic renal insufficiency, hypertension, diabetes mellitus presented to the ER for intractable nausea and vomiting since last night and unable to take her medications. She has noticed that she was feeling short of breath and noticed her lower extremity edema right worse than left in the last 1 week.    Assessment/Plan:  Pulmonary embolism - on heparin gtt, initially on nasal cannula, now doing well on room and her, was able to work with physical therapy yesterday and not needing oxygen. - Her lower extremity ultrasound came back positive for DVT. Hematology evaluate the patient while hospitalized, and patient was started on Coumadin bridged with a heparin drip. - 2D echo as below. No major changes since her previous 2-D echo.  Hypoxic respiratory failure - due to #1. - Now she is on room  Breast cancer of upper-outer quadrant of right female breast - she is being followed by Dr. Humphrey Rolls and will follow up as an outpatient.   Nausea and vomiting: No abdominal pain, Currently improving, able to tolerate a diet.   Accelerated hypertension - better  Diet: carb modified Fluids: none DVT Prophylaxis: heparin/Coumadin  Code Status: Full Family Communication: husband bedside  Disposition Plan: inpatient: Home when INR therapeutic,   Consultants:  Oncology   Procedures:  2D echo Study Conclusions - Left ventricle: The cavity size was normal. There was moderate concentric hypertrophy. Systolic function was vigorous. The estimated ejection fraction was in the range of 65% to 70%. Wall motion was normal; there were no regional wall motion abnormalities. Doppler parameters are consistent with abnormal  left ventricular relaxation (grade 1 diastolic dysfunction). Doppler parameters are consistent with elevated ventricular end-diastolic filling pressure. - Aortic valve: Trileaflet; moderately thickened, moderately calcified leaflets. Cusp separation was moderately reduced. There was mild stenosis. Valve area: 1.49cm^2 (Vmax). - Aortic root: The aortic root was normal in size. - Mitral valve: Severe miatral annular calcification involving part of the leaflets. No regurgitation. - Left atrium: The atrium was moderately dilated. - Right ventricle: Systolic function was normal. - Pulmonic valve: Mild regurgitation. - Pulmonary arteries: Systolic pressure was within the normal range. - Pericardium, extracardiac: There was no pericardial effusion. Impressions:  - When compared to the prior study from 04/22/2013 there is no significant change.   Antibiotics - none  HPI/Subjective: - Feeling well this morning, still weak  Objective: Filed Vitals:   06/14/13 1502 06/14/13 2123 06/14/13 2200 06/15/13 0446  BP: 142/50 175/52 169/57 158/57  Pulse: 74 64 69 65  Temp: 98.4 F (36.9 C) 98.3 F (36.8 C)  98 F (36.7 C)  TempSrc: Oral Oral  Oral  Resp: 16 18  18   Height:      Weight: 76.9 kg (169 lb 8.5 oz)   77 kg (169 lb 12.1 oz)  SpO2: 99% 96%  96%    Intake/Output Summary (Last 24 hours) at 06/15/13 0708 Last data filed at 06/15/13 0600  Gross per 24 hour  Intake  948.5 ml  Output   1125 ml  Net -176.5 ml   Filed Weights   06/10/13 2113 06/14/13 1502 06/15/13 0446  Weight: 78.2 kg (172 lb 6.4 oz) 76.9 kg (169 lb 8.5 oz) 77 kg (169 lb 12.1 oz)  Exam:  General:  NAD  Cardiovascular: regular rate and rhythm, without MRG  Respiratory: good air movement, clear to auscultation throughout, no wheezing, ronchi or rales  Abdomen: soft, not tender to palpation, positive bowel sounds  MSK: trace bilateral LE edema  Neuro: non focal  Data Reviewed: Basic Metabolic Panel:  Recent Labs Lab  06/10/13 1446 06/11/13 0437 06/12/13 0535 06/13/13 0508 06/15/13 0435  NA 141 142 140 137 137  K 3.5* 4.0 3.7 4.0 4.6  CL 96 99 101 101 102  CO2 30 31 30 29 25   GLUCOSE 168* 198* 161* 172* 197*  BUN 37* 29* 23 22 18   CREATININE 1.51* 1.50* 1.52* 1.47* 1.36*  CALCIUM 9.3 8.4 8.9 8.6 8.9   Liver Function Tests:  Recent Labs Lab 06/10/13 1446  AST 14  ALT 25  ALKPHOS 94  BILITOT 0.5  PROT 6.5  ALBUMIN 3.4*    Recent Labs Lab 06/10/13 1446  LIPASE 76*   CBC:  Recent Labs Lab 06/10/13 1446 06/11/13 0437 06/12/13 0535 06/13/13 0508 06/14/13 0755 06/15/13 0435  WBC 14.8* 12.6* 10.6* 8.5 8.9 9.9  NEUTROABS 11.6*  --   --   --   --   --   HGB 10.6* 9.8* 9.3* 9.0* 9.4* 9.5*  HCT 32.5* 29.6* 29.0* 27.8* 29.3* 29.2*  MCV 91.0 90.8 92.7 93.0 93.0 92.7  PLT 209 210 205 173 181 187   Cardiac Enzymes:  Recent Labs Lab 06/10/13 1550  TROPONINI <0.30   BNP (last 3 results)  Recent Labs  06/07/13 1215  PROBNP 557.1*   CBG:  Recent Labs Lab 06/13/13 2103 06/14/13 0747 06/14/13 1135 06/14/13 1657 06/14/13 2122  GLUCAP 208* 162* 223* 149* 171*    Recent Results (from the past 240 hour(s))  CULTURE, BLOOD (ROUTINE X 2)     Status: None   Collection Time    06/10/13  3:50 PM      Result Value Ref Range Status   Specimen Description BLOOD LEFT ANTECUBITAL   Final   Special Requests BOTTLES DRAWN AEROBIC AND ANAEROBIC 5CC   Final   Culture  Setup Time     Final   Value: 06/10/2013 22:25     Performed at [facility]   Culture     Final   Value:        BLOOD CULTURE RECEIVED NO GROWTH TO DATE CULTURE WILL BE HELD FOR 5 DAYS BEFORE ISSUING A FINAL NEGATIVE REPORT     Performed at [facility]   Report Status PENDING   Incomplete  CULTURE, BLOOD (ROUTINE X 2)     Status: None   Collection Time    06/10/13  4:08 PM      Result Value Ref Range Status   Specimen Description BLOOD L CHEST   Final   Special Requests BOTTLES DRAWN AEROBIC  AND ANAEROBIC 5ML   Final   Culture  Setup Time     Final   Value: 06/10/2013 22:25     Performed at [facility]   Culture     Final   Value:        BLOOD CULTURE RECEIVED NO GROWTH TO DATE CULTURE WILL BE HELD FOR 5 DAYS BEFORE ISSUING A FINAL NEGATIVE REPORT     Performed at [facility]   Report Status PENDING   Incomplete  URINE CULTURE     Status: None   Collection Time    06/11/13 12:29 AM  Result Value Ref Range Status   Specimen Description URINE, CLEAN CATCH   Final   Special Requests NONE   Final   Culture  Setup Time     Final   Value: 06/11/2013 04:11     Performed at [facility]     Final   Value: 60,000 COLONIES/ML     Performed at [facility]   Culture     Final   Value: Multiple bacterial morphotypes present, none predominant. Suggest appropriate recollection if clinically indicated.     Performed at [facility]   Report Status 06/12/2013 FINAL   Final     Studies: No results found.  Scheduled Meds: . atorvastatin  10 mg Oral QHS  . cloNIDine  0.2 mg Oral BID  . insulin aspart  0-5 Units Subcutaneous QHS  . insulin aspart  0-9 Units Subcutaneous TID WC  . lactose free nutrition  237 mL Oral Q24H  . potassium chloride  20 mEq Oral Daily  . sodium chloride  3 mL Intravenous Q12H  . traMADol  50 mg Oral BID  . verapamil  240 mg Oral Daily  . Warfarin - Pharmacist Dosing Inpatient   Does not apply q1800   Continuous Infusions: . heparin 1,000 Units/hr (06/15/13 0536)   Principal Problem:   Pulmonary embolism Active Problems:   Breast cancer of upper-outer quadrant of right female breast   Edema leg   Nausea and vomiting   Accelerated hypertension   Acute respiratory failure with hypoxia  Time spent: 25  This note has been created with Surveyor, quantity. Any transcriptional errors are unintentional.   Marzetta Board, MD [facility] Pager [phone]. If 7 PM - 7 AM, please contact night-coverage at [email or web address], password [facility] 06/15/2013, 7:08 AM  LOS: 5 days

## 2013-06-15 NOTE — Progress Notes (Signed)
Physical Therapy Treatment Patient Details Name: Sabrina [name] MRN: [id number] DOB: [birth year] Today's Date: 06/15/2013    History of Present Illness Patient is 80 year old female with history of new diagnosis of T1 N1 right breast cancer , following Dr. Humphrey Rolls, status post lumpectomy, on the chemotherapies started in March 2015, chronic renal insufficiency, hypertension, diabetes mellitus presented to the ER for intractable nausea and vomiting since last night and unable to take her medications. She has noticed that she was feeling short of breath and noticed her lower extremity edema right worse than left in the last 1 week.    PT Comments    **Pt tolerated increased gait distance today. Exercises deferred 2* fatigue after walking. Will try rollator next visit. *  Follow Up Recommendations  Home health PT     Equipment Recommendations  Other (comment) (Rollator)    Recommendations for Other Services       Precautions / Restrictions Precautions Precautions: Fall Restrictions Weight Bearing Restrictions: No    Mobility  Bed Mobility                  Transfers Overall transfer level: Needs assistance Equipment used: Rolling walker (2 wheeled) Transfers: Sit to/from Stand Sit to Stand: Supervision         General transfer comment: cues for safe hand placement with RW  Ambulation/Gait Ambulation/Gait assistance: Modified independent (Device/Increase time) Ambulation Distance (Feet): 158 Feet Assistive device: Rolling walker (2 wheeled)   Gait velocity: decreased   General Gait Details: pt with good safety with obstacle with RW; distance limited by fatigue   Stairs            Wheelchair Mobility    Modified Rankin (Stroke Patients Only)       Balance     Sitting balance-Leahy Scale: Good       Standing balance-Leahy Scale: Fair Standing balance comment: Pt stood for 5 minutes with RW.                     Cognition  Arousal/Alertness: Awake/alert Behavior During Therapy: WFL for tasks assessed/performed Overall Cognitive Status: Within Functional Limits for tasks assessed                      Exercises      General Comments        Pertinent Vitals/Pain *0/10 pain**    Home Living                      Prior Function            PT Goals (current goals can now be found in the care plan section) Acute Rehab PT Goals Patient Stated Goal: go home PT Goal Formulation: With patient Time For Goal Achievement: 06/27/13 Potential to Achieve Goals: Good Progress towards PT goals: Progressing toward goals    Frequency  Min 3X/week    PT Plan Current plan remains appropriate    Co-evaluation             End of Session Equipment Utilized During Treatment: Gait belt Activity Tolerance: Patient tolerated treatment well Patient left: in chair;with call bell/phone within reach     Time: [id number] PT Time Calculation (min): 24 min  Charges:  $Gait Training: 23-37 mins                    G Codes:      [name] 06/15/2013, 2:09 PM  [phone]   

## 2013-06-15 NOTE — Progress Notes (Signed)
ANTICOAGULATION CONSULT NOTE - Follow Up  Pharmacy Consult for Warfarin / Heparin Indication: PE and DVT  No Known Allergies  Patient Measurements: Height: 4\' 10"  (147.3 cm) Weight: 169 lb 12.1 oz (77 kg) IBW/kg (Calculated) : 40.9 Heparin Dosing Weight: 63.2kg  Vital Signs: Temp: 98 F (36.7 C) (04/14 0446) Temp src: Oral (04/14 0446) BP: 158/57 mmHg (04/14 0446) Pulse Rate: 65 (04/14 0446)  Labs:  Recent Labs  06/13/13 0508 06/14/13 0442 06/14/13 0755 06/14/13 1555 06/15/13 0435  HGB 9.0*  --  9.4*  --  9.5*  HCT 27.8*  --  29.3*  --  29.2*  PLT 173  --  181  --  187  LABPROT 15.4*  --  16.8*  --  17.7*  INR 1.25  --  1.40  --  1.50*  HEPARINUNFRC 0.33 0.19*  --  0.45 0.38  CREATININE 1.47*  --   --   --  1.36*    Estimated Creatinine Clearance: 26.9 ml/min (by C-G formula based on Cr of 1.36).  Warfarin doses: 4/11 5mg , 4/12 2.5mg , 4/13 2.5mg   Assessment: 80 yo female with breast cancer, on chemotherapy, presents to ED with shortness of breath, nausea and vomiting. VQ scan shows high probability for RLL PE and LE dopplers positive for bilateral DVTs. Pharmacy consulted to dose IV heparin on 4/9, now coumadin added 4/11.  Oncology discussed various anticoagulation options with pt, and warfarin was chosen due to CKD.     4/14:  Day 4 of 5 (minimum) warfarin/heparin bridge  CBC:  Hgb 9.5, Plt 187.  Remains fairly stable.  No bleeding/complications reported.  SCr 1.36, CrCl ~ 27 ml/min  Heparin level (0.38) therapeutic this AM  INR 1.5, remains subtherapeutic but increasing.  Warfarin education by pharmacist completed 4/12.  Diet: carb modified.  Pt eating ~75% of meals.    Goal of Therapy:  INR 2-3 Heparin level 0.3-0.7 units/ml Monitor platelets by anticoagulation protocol: Yes   Plan:   Continue heparin IV infusion at 1000 units/hr   Continue heparin (or lovenox) for 5 days AND therapeutic INR x 24hrs per CHEST guidelines  Warfarin 3.5 mg PO x  1 today  Daily heparin level, CBC, PT/INR   Gretta Arab PharmD, BCPS Pager [phone] 06/15/2013 7:08 AM

## 2013-06-16 DIAGNOSIS — J9600 Acute respiratory failure, unspecified whether with hypoxia or hypercapnia: Secondary | ICD-10-CM

## 2013-06-16 DIAGNOSIS — I359 Nonrheumatic aortic valve disorder, unspecified: Secondary | ICD-10-CM

## 2013-06-16 LAB — CBC
HCT: 27.6 % — ABNORMAL LOW (ref 36.0–46.0)
Hemoglobin: 9.3 g/dL — ABNORMAL LOW (ref 12.0–15.0)
MCH: 30.7 pg (ref 26.0–34.0)
MCHC: 33.7 g/dL (ref 30.0–36.0)
MCV: 91.1 fL (ref 78.0–100.0)
Platelets: 186 10*3/uL (ref 150–400)
RBC: 3.03 MIL/uL — ABNORMAL LOW (ref 3.87–5.11)
RDW: 15.3 % (ref 11.5–15.5)
WBC: 10 10*3/uL (ref 4.0–10.5)

## 2013-06-16 LAB — BASIC METABOLIC PANEL
BUN: 18 mg/dL (ref 6–23)
CO2: 26 mEq/L (ref 19–32)
Calcium: 8.8 mg/dL (ref 8.4–10.5)
Chloride: 103 mEq/L (ref 96–112)
Creatinine, Ser: 1.32 mg/dL — ABNORMAL HIGH (ref 0.50–1.10)
GFR calc Af Amer: 42 mL/min — ABNORMAL LOW (ref >90)
GFR calc non Af Amer: 36 mL/min — ABNORMAL LOW (ref >90)
Glucose, Bld: 188 mg/dL — ABNORMAL HIGH (ref 70–99)
Potassium: 4.6 mEq/L (ref 3.7–5.3)
Sodium: 138 mEq/L (ref 137–147)

## 2013-06-16 LAB — GLUCOSE, CAPILLARY
Glucose-Capillary: 174 mg/dL — ABNORMAL HIGH (ref 70–99)
Glucose-Capillary: 182 mg/dL — ABNORMAL HIGH (ref 70–99)
Glucose-Capillary: 192 mg/dL — ABNORMAL HIGH (ref 70–99)
Glucose-Capillary: 191 mg/dL — ABNORMAL HIGH (ref 70–99)
Glucose-Capillary: 249 mg/dL — ABNORMAL HIGH (ref 70–99)

## 2013-06-16 LAB — CULTURE, BLOOD (ROUTINE X 2)
Culture: NO GROWTH
Culture: NO GROWTH

## 2013-06-16 LAB — HEPARIN LEVEL (UNFRACTIONATED): Heparin Unfractionated: 0.51 IU/mL (ref 0.30–0.70)

## 2013-06-16 LAB — PROTIME-INR
INR: 1.74 — ABNORMAL HIGH (ref 0.00–1.49)
Prothrombin Time: 19.8 seconds — ABNORMAL HIGH (ref 11.6–15.2)

## 2013-06-16 MED ORDER — WARFARIN SODIUM 3 MG PO TABS
3.5000 mg | ORAL_TABLET | Freq: Once | ORAL | Status: AC
  Administered 2013-06-16: 3.5 mg via ORAL
  Filled 2013-06-16: qty 1

## 2013-06-16 NOTE — Progress Notes (Signed)
ANTICOAGULATION CONSULT NOTE - Follow Up  Pharmacy Consult for Warfarin / Heparin Indication: PE and DVT  No Known Allergies  Patient Measurements: Height: 4\' 10"  (147.3 cm) Weight: 170 lb 13.7 oz (77.5 kg) IBW/kg (Calculated) : 40.9 Heparin Dosing Weight: 63.2kg  Vital Signs: Temp: 98.4 F (36.9 C) (04/15 0407) Temp src: Oral (04/15 0407) BP: 165/64 mmHg (04/15 0407) Pulse Rate: 64 (04/15 0407)  Labs:  Recent Labs  06/14/13 0755 06/14/13 1555 06/15/13 0435 06/16/13 0356  HGB 9.4*  --  9.5* 9.3*  HCT 29.3*  --  29.2* 27.6*  PLT 181  --  187 186  LABPROT 16.8*  --  17.7* 19.8*  INR 1.40  --  1.50* 1.74*  HEPARINUNFRC  --  0.45 0.38 0.51  CREATININE  --   --  1.36* 1.32*    Estimated Creatinine Clearance: 27.8 ml/min (by C-G formula based on Cr of 1.32).  Warfarin doses: 4/11 5mg , 4/12 2.5mg , 4/13 2.5mg , 4/14 3.5mg   Assessment: 80 yo female with breast cancer, on chemotherapy, presents to ED with shortness of breath, nausea and vomiting. VQ scan shows high probability for RLL PE and LE dopplers positive for bilateral DVTs. Pharmacy consulted to dose IV heparin on 4/9, now coumadin added 4/11.  Oncology discussed various anticoagulation options with pt, and warfarin was chosen due to CKD.     4/15:  Day 5 of 5 (minimum) warfarin/heparin bridge  CBC:  Hgb 9.3, Plt 186.  Remains fairly stable.  No bleeding/complications reported.  SCr 1.32, CrCl ~ 28 ml/min  Heparin level (0.51) therapeutic this AM  INR 1.74, remains subtherapeutic but increasing.  Warfarin education by pharmacist completed 4/12.  Diet: carb modified.  Pt eating ~50-75% of meals.  Goal of Therapy:  INR 2-3 Heparin level 0.3-0.7 units/ml Monitor platelets by anticoagulation protocol: Yes   Plan:   Continue heparin IV infusion at 1000 units/hr   Continue heparin (or lovenox) for 5 days AND therapeutic INR x 24hrs per CHEST guidelines  Warfarin 3.5 mg PO x 1 today  Daily heparin level,  CBC, PT/INR   Gretta Arab PharmD, BCPS Pager [phone] 06/16/2013 7:00 AM

## 2013-06-16 NOTE — Progress Notes (Signed)
Patient ID: Sabrina [name], female   DOB: Jun 23, 1926, 80 y.o.   MRN: [id number]  [facility] PROGRESS NOTE  Sabrina [name] [id number] DOB: Sep 18, 1926 DOA: 06/10/2013 PCP: [name], MD  Brief narrative: Patient is 80 year old female with history of new diagnosis of T1 N1 right breast cancer , following Dr. Humphrey Rolls, status post lumpectomy, on the chemotherapies started in March 2015, chronic renal insufficiency, hypertension, diabetes mellitus presented to the ER for intractable nausea and vomiting, unable to take her medications, feeling short of breath and noticed her lower extremity edema right worse than left in the last 1 week.   Principal Problem:   Pulmonary embolism, DVT - clinically stable and feeling well this AM - on heparin drip with transition to Coumadin - monitor PT/INR and d/c one INR therapeutic  Active Problems:   Breast cancer of upper-outer quadrant of right female breast - she is being followed by Dr. Humphrey Rolls and will follow up as an outpatient   Nausea and vomiting - possibly related to principal problem - now resolved and pt tolerating current diet well    Accelerated hypertension - still elevated - currently on clonidine and verapamil - will add hydralazine    Acute respiratory failure with hypoxia - secondary to principal problem - coumadin as noted above \  Consultants:  Oncology  Procedures:   2D ECHO EF 65% to [id number], grade 1 diastolic dysfunction Antibiotics  None   Code Status: Full Family Communication: Pt at bedside Disposition Plan: Home when medically stable  HPI/Subjective: No events overnight.   Objective: Filed Vitals:   06/16/13 0407 06/16/13 0745 06/16/13 1029 06/16/13 1031  BP: 165/64 177/50 152/53 152/53  Pulse: 64 64    Temp: 98.4 F (36.9 C) 97.7 F (36.5 C)    TempSrc: Oral Oral    Resp: 18 18    Height:      Weight: 77.5 kg (170 lb 13.7 oz)     SpO2: 99% 98%      Intake/Output Summary (Last 24 hours) at  06/16/13 1421 Last data filed at 06/16/13 1342  Gross per 24 hour  Intake   1040 ml  Output      0 ml  Net   1040 ml    Exam:   General:  Pt is alert, follows commands appropriately, not in acute distress  Cardiovascular: Regular rate and rhythm, S1/S2, no murmurs, no rubs, no gallops  Respiratory: Clear to auscultation bilaterally, no wheezing, no crackles, no rhonchi  Abdomen: Soft, non tender, non distended, bowel sounds present, no guarding  Data Reviewed: Basic Metabolic Panel:  Recent [name] 06/11/13 0437 06/12/13 0535 06/13/13 0508 06/15/13 0435 06/16/13 0356  NA 142 140 137 137 138  K 4.0 3.7 4.0 4.6 4.6  CL 99 101 101 102 103  CO2 31 30 29 25 26   GLUCOSE 198* 161* 172* 197* 188*  BUN 29* 23 22 18 18   CREATININE 1.50* 1.52* 1.47* 1.36* 1.32*  CALCIUM 8.4 8.9 8.6 8.9 8.8   Liver Function Tests:  Recent [name] 06/10/13 1446  AST 14  ALT 25  ALKPHOS 94  BILITOT 0.5  PROT 6.5  ALBUMIN 3.4*    Recent [name] 06/10/13 1446  LIPASE 76*   CBC:  Recent [name] 06/10/13 1446  06/12/13 0535 06/13/13 0508 06/14/13 0755 06/15/13 0435 06/16/13 0356  WBC 14.8*  < > 10.6* 8.5 8.9 9.9 10.0  NEUTROABS 11.6*  --   --   --   --   --   --  HGB 10.6*  < > 9.3* 9.0* 9.4* 9.5* 9.3*  HCT 32.5*  < > 29.0* 27.8* 29.3* 29.2* 27.6*  MCV 91.0  < > 92.7 93.0 93.0 92.7 91.1  PLT 209  < > 205 173 181 187 186  < > = values in this interval not displayed. Cardiac Enzymes:  Recent [name] 06/10/13 1550  TROPONINI <0.30   CBG:  Recent [name] 06/15/13 1157 06/15/13 1740 06/15/13 2207 06/16/13 0731 06/16/13 1150  GLUCAP 176* 151* 210* 174* 182*    Recent Results (from the past 240 hour(s))  CULTURE, BLOOD (ROUTINE X 2)     Status: None   Collection Time    06/10/13  3:50 PM      Result Value Ref Range Status   Specimen Description BLOOD LEFT ANTECUBITAL   Final   Special Requests BOTTLES DRAWN AEROBIC AND ANAEROBIC 5CC   Final   Culture   Setup Time     Final   Value: 06/10/2013 22:25     Performed at [facility]   Culture     Final   Value: NO GROWTH 5 DAYS     Performed at [facility]   Report Status 06/16/2013 FINAL   Final  CULTURE, BLOOD (ROUTINE X 2)     Status: None   Collection Time    06/10/13  4:08 PM      Result Value Ref Range Status   Specimen Description BLOOD L CHEST   Final   Special Requests BOTTLES DRAWN AEROBIC AND ANAEROBIC 5ML   Final   Culture  Setup Time     Final   Value: 06/10/2013 22:25     Performed at [facility]   Culture     Final   Value: NO GROWTH 5 DAYS     Performed at [facility]   Report Status 06/16/2013 FINAL   Final  URINE CULTURE     Status: None   Collection Time    06/11/13 12:29 AM      Result Value Ref Range Status   Specimen Description URINE, CLEAN CATCH   Final   Special Requests NONE   Final   Culture  Setup Time     Final   Value: 06/11/2013 04:11     Performed at [facility]     Final   Value: 60,000 COLONIES/ML     Performed at [facility]   Culture     Final   Value: Multiple bacterial morphotypes present, none predominant. Suggest appropriate recollection if clinically indicated.     Performed at [facility]   Report Status 06/12/2013 FINAL   Final     Scheduled Meds: . atorvastatin  10 mg Oral QHS  . cloNIDine  0.2 mg Oral BID  . insulin aspart  0-5 Units Subcutaneous QHS  . insulin aspart  0-9 Units Subcutaneous TID WC  . lactose free nutrition  237 mL Oral Q24H  . potassium chloride  20 mEq Oral Daily  . sodium chloride  3 mL Intravenous Q12H  . traMADol  50 mg Oral BID  . verapamil  240 mg Oral Daily  . warfarin  3.5 mg Oral ONCE-1800  . Warfarin - Pharmacist Dosing Inpatient   Does not apply q1800   Continuous Infusions: . heparin 1,000 Units/hr (06/16/13 0636)     Theodis Blaze, MD  [facility] Pager [phone]  If 7PM-7AM, please contact  night-coverage [email or web address] Password [email or web address]  06/16/2013, 2:21 PM   LOS: 6 days

## 2013-06-17 LAB — HEPARIN LEVEL (UNFRACTIONATED): Heparin Unfractionated: 0.48 IU/mL (ref 0.30–0.70)

## 2013-06-17 LAB — CBC
HCT: 26.5 % — ABNORMAL LOW (ref 36.0–46.0)
Hemoglobin: 8.8 g/dL — ABNORMAL LOW (ref 12.0–15.0)
MCH: 31 pg (ref 26.0–34.0)
MCHC: 33.2 g/dL (ref 30.0–36.0)
MCV: 93.3 fL (ref 78.0–100.0)
Platelets: 167 10*3/uL (ref 150–400)
RBC: 2.84 MIL/uL — ABNORMAL LOW (ref 3.87–5.11)
RDW: 15.5 % (ref 11.5–15.5)
WBC: 10 10*3/uL (ref 4.0–10.5)

## 2013-06-17 LAB — BASIC METABOLIC PANEL
BUN: 16 mg/dL (ref 6–23)
CO2: 29 mEq/L (ref 19–32)
Calcium: 8.9 mg/dL (ref 8.4–10.5)
Chloride: 102 mEq/L (ref 96–112)
Creatinine, Ser: 1.36 mg/dL — ABNORMAL HIGH (ref 0.50–1.10)
GFR calc Af Amer: 40 mL/min — ABNORMAL LOW (ref >90)
GFR calc non Af Amer: 35 mL/min — ABNORMAL LOW (ref >90)
Glucose, Bld: 200 mg/dL — ABNORMAL HIGH (ref 70–99)
Potassium: 5 mEq/L (ref 3.7–5.3)
Sodium: 137 mEq/L (ref 137–147)

## 2013-06-17 LAB — PROTIME-INR
INR: 2.12 — ABNORMAL HIGH (ref 0.00–1.49)
Prothrombin Time: 23.1 seconds — ABNORMAL HIGH (ref 11.6–15.2)

## 2013-06-17 LAB — GLUCOSE, CAPILLARY
Glucose-Capillary: 173 mg/dL — ABNORMAL HIGH (ref 70–99)
Glucose-Capillary: 239 mg/dL — ABNORMAL HIGH (ref 70–99)
Glucose-Capillary: 212 mg/dL — ABNORMAL HIGH (ref 70–99)
Glucose-Capillary: 188 mg/dL — ABNORMAL HIGH (ref 70–99)

## 2013-06-17 MED ORDER — WARFARIN 0.5 MG HALF TABLET
3.5000 mg | ORAL_TABLET | Freq: Once | ORAL | Status: AC
  Administered 2013-06-17: 3.5 mg via ORAL
  Filled 2013-06-17: qty 1

## 2013-06-17 NOTE — Progress Notes (Signed)
Physical Therapy Treatment Patient Details Name: Sabrina [name] MRN: [id number] DOB: 1926/10/20 Today's Date: 06/17/2013    History of Present Illness Patient is 80 year old female with history of new diagnosis of T1 N1 right breast cancer , following Dr. Humphrey Rolls, status post lumpectomy, on the chemotherapies started in March 2015, chronic renal insufficiency, hypertension, diabetes mellitus presented to the ER for intractable nausea and vomiting since last night and unable to take her medications. She has noticed that she was feeling short of breath and noticed her lower extremity edema right worse than left in the last 1 week.    PT Comments    Pt limited by fatigue. Loves to walk. Recommend rollator RW.  Follow Up Recommendations  Home health PT     Equipment Recommendations   (rollator)    Recommendations for Other Services       Precautions / Restrictions Precautions Precautions: Fall Precaution Comments: ck sats    Mobility  Bed Mobility                  Transfers Overall transfer level: Needs assistance Equipment used: Rolling walker (2 wheeled)   Sit to Stand: Supervision         General transfer comment: cues for safe hand placement with RW  Ambulation/Gait Ambulation/Gait assistance: Supervision;Min guard Ambulation Distance (Feet): 120 Feet Assistive device: Rolling walker (2 wheeled) Gait Pattern/deviations: Decreased stride length Gait velocity: decreased Gait velocity interpretation: Below normal speed for age/gender General Gait Details: pt with good safety with obstacle with RW; distance limited by fatigue, required to ambulate slowly for fatigue.    Stairs            Wheelchair Mobility    Modified Rankin (Stroke Patients Only)       Balance                                    Cognition Arousal/Alertness: Awake/alert                          Exercises      General Comments        Pertinent  Vitals/Pain sats 99 % ra    Home Living                      Prior Function            PT Goals (current goals can now be found in the care plan section) Progress towards PT goals: Progressing toward goals    Frequency  Min 3X/week    PT Plan Current plan remains appropriate    Co-evaluation             End of Session   Activity Tolerance: Patient tolerated treatment well;Patient limited by fatigue Patient left: in chair;with call bell/phone within reach     Time: 1445-1512 PT Time Calculation (min): 27 min  Charges:  $Gait Training: 23-37 mins                    G Codes:      Sabrina [name] 06/17/2013, 3:16 PM

## 2013-06-17 NOTE — Progress Notes (Signed)
Patient ID: Sabrina [name], female   DOB: 1926-05-18, 80 y.o.   MRN: [id number] [facility] PROGRESS NOTE  Sabrina [name] [id number] DOB: 1926-07-01 DOA: 06/10/2013 PCP: [name], MD  Brief narrative:  Patient is 80 year old female with history of new diagnosis of T1 N1 right breast cancer , following Dr. Humphrey Rolls, status post lumpectomy, on the chemotherapies started in March 2015, chronic renal insufficiency, hypertension, diabetes mellitus presented to the ER for intractable nausea and vomiting, unable to take her medications, feeling short of breath and noticed her lower extremity edema right worse than left in the last 1 week.   Principal Problem:  Pulmonary embolism, DVT  - clinically stable and feeling well this AM  - on heparin drip with transition to Coumadin  - INR therapeutic this AM, if it remains stable, plan d/c in AM Active Problems:  Diabetes mellitus - A1C > 8, will continue SSI while inpatient  - pt is on Metformin at home and this should probably be held until renal function improves  - will consider glipizide on discharge or low dose januvia  Acute on chronic renal failure - Cr remain at baseline - repeat CBC in AM Breast cancer of upper-outer quadrant of right female breast  - she is being followed by Dr. Humphrey Rolls and will follow up as an outpatient  Nausea and vomiting  - possibly related to principal problem  - now resolved and pt tolerating current diet well  - place on PPI  Accelerated hypertension  - currently on clonidine and verapamil  - hydralazine added 4/15 and now BP is stable and at target range  Acute respiratory failure with hypoxia  - secondary to principal problem  - coumadin as noted above  Acute on chronic blood loss anemia - pt denies any active bleeding - Hg drop overnight - repeat CBC in AM  Consultants:   Oncology  Procedures:   2D ECHO EF 65% to [id number], grade 1 diastolic dysfunction Antibiotics   None   Code Status: Full   Family Communication: Pt at bedside  Disposition Plan: Home when medically stable   HPI/Subjective: No events overnight.   Objective: Filed Vitals:   06/17/13 0830 06/17/13 1054 06/17/13 1055 06/17/13 1352  BP: 140/58 142/50 142/50 118/62  Pulse: 51   55  Temp: 97.3 F (36.3 C)   97.4 F (36.3 C)  TempSrc: Oral   Oral  Resp: 16   18  Height:      Weight:      SpO2: 99%   99%    Intake/Output Summary (Last 24 hours) at 06/17/13 1633 Last data filed at 06/17/13 1400  Gross per 24 hour  Intake    855 ml  Output   1900 ml  Net  -1045 ml    Exam:   General:  Pt is alert, follows commands appropriately, not in acute distress  Cardiovascular: Regular rate and rhythm, S1/S2, no murmurs, no rubs, no gallops  Respiratory: Clear to auscultation bilaterally, no wheezing, no crackles, no rhonchi  Abdomen: Soft, non tender, non distended, bowel sounds present, no guarding  Neuro: Grossly nonfocal  Data Reviewed: Basic Metabolic Panel:  [name] 06/12/13 0535 06/13/13 0508 06/15/13 0435 06/16/13 0356 06/17/13 0347  NA 140 137 137 138 137  K 3.7 4.0 4.6 4.6 5.0  CL 101 101 102 103 102  CO2 30 29 25 26 29   GLUCOSE 161* 172* 197* 188* 200*  BUN 23 22 18 18 16   CREATININE 1.52*  1.47* 1.36* 1.32* 1.36*  CALCIUM 8.9 8.6 8.9 8.8 8.9   CBC:  [name] 06/13/13 0508 06/14/13 0755 06/15/13 0435 06/16/13 0356 06/17/13 0347  WBC 8.5 8.9 9.9 10.0 10.0  HGB 9.0* 9.4* 9.5* 9.3* 8.8*  HCT 27.8* 29.3* 29.2* 27.6* 26.5*  MCV 93.0 93.0 92.7 91.1 93.3  PLT 173 181 187 186 167   CBG:  [name] 06/16/13 1640 06/16/13 1826 06/16/13 2255 06/17/13 0725 06/17/13 1125  GLUCAP 192* 191* 249* 173* 239*    Recent Results (from the past 240 hour(s))  CULTURE, BLOOD (ROUTINE X 2)     Status: None   Collection Time    06/10/13  3:50 PM      Result Value Ref Range Status   Specimen Description BLOOD LEFT ANTECUBITAL   Final   Special Requests BOTTLES  DRAWN AEROBIC AND ANAEROBIC 5CC   Final   Culture  Setup Time     Final   Value: 06/10/2013 22:25     Performed at [facility]   Culture     Final   Value: NO GROWTH 5 DAYS     Performed at [facility]   Report Status 06/16/2013 FINAL   Final  CULTURE, BLOOD (ROUTINE X 2)     Status: None   Collection Time    06/10/13  4:08 PM      Result Value Ref Range Status   Specimen Description BLOOD L CHEST   Final   Special Requests BOTTLES DRAWN AEROBIC AND ANAEROBIC 5ML   Final   Culture  Setup Time     Final   Value: 06/10/2013 22:25     Performed at [facility]   Culture     Final   Value: NO GROWTH 5 DAYS     Performed at [facility]   Report Status 06/16/2013 FINAL   Final  URINE CULTURE     Status: None   Collection Time    06/11/13 12:29 AM      Result Value Ref Range Status   Specimen Description URINE, CLEAN CATCH   Final   Special Requests NONE   Final   Culture  Setup Time     Final   Value: 06/11/2013 04:11     Performed at [place]     Final   Value: 60,000 COLONIES/ML     Performed at [facility]   Culture     Final   Value: Multiple bacterial morphotypes present, none predominant. Suggest appropriate recollection if clinically indicated.     Performed at [facility]   Report Status 06/12/2013 FINAL   Final     Scheduled Meds: . atorvastatin  10 mg Oral QHS  . cloNIDine  0.2 mg Oral BID  . insulin aspart  0-5 Units Subcutaneous QHS  . insulin aspart  0-9 Units Subcutaneous TID WC  . potassium chloride  20 mEq Oral Daily  . sodium chloride  3 mL Intravenous Q12H  . traMADol  50 mg Oral BID  . verapamil  240 mg Oral Daily  . warfarin  3.5 mg Oral ONCE-1800  . Warfarin - Pharmacist Dosing Inpatient   Does not apply q1800   Continuous Infusions: . heparin 1,000 Units/hr (06/17/13 0626)   Theodis Blaze, MD  [facility] Pager [phone]  If 7PM-7AM, please contact  night-coverage [email or web address] Password [facility] 06/17/2013, 4:33 PM   LOS: 7 days

## 2013-06-17 NOTE — Progress Notes (Signed)
ANTICOAGULATION CONSULT NOTE - Follow Up  Pharmacy Consult for Warfarin / Heparin Indication: PE and DVT  No Known Allergies  Patient Measurements: Height: 4\' 10"  (147.3 cm) Weight: 173 lb 11.6 oz (78.8 kg) IBW/kg (Calculated) : 40.9 Heparin Dosing Weight: 63.2kg  Vital Signs: Temp: 97.5 F (36.4 C) (04/16 0459) Temp src: Oral (04/16 0459) BP: 134/48 mmHg (04/16 0459) Pulse Rate: 49 (04/16 0459)  Labs:  Recent Labs  06/15/13 0435 06/16/13 0356 06/17/13 0347  HGB 9.5* 9.3* 8.8*  HCT 29.2* 27.6* 26.5*  PLT 187 186 167  LABPROT 17.7* 19.8* 23.1*  INR 1.50* 1.74* 2.12*  HEPARINUNFRC 0.38 0.51 0.48  CREATININE 1.36* 1.32* 1.36*    Estimated Creatinine Clearance: 27.3 ml/min (by C-G formula based on Cr of 1.36).  Warfarin doses: 4/11 5mg , 4/12 2.5mg , 4/13 2.5mg , 4/14 3.5mg , 4/15 3.5mg   Assessment: 80 yo female with breast cancer, on chemotherapy, presents to ED with shortness of breath, nausea and vomiting. VQ scan shows high probability for RLL PE and LE dopplers positive for bilateral DVTs. Pharmacy consulted to dose IV heparin on 4/9, now coumadin added 4/11.  Oncology discussed various anticoagulation options with pt, and warfarin was chosen due to CKD.     4/16:  Day 6 warfarin/heparin bridge.  D1 therapeutic INR.  INR 2.12  Heparin level (0.48) therapeutic this AM  CBC:  Hgb decreased to 8.8, Plt 167.  No bleeding/complications reported.  SCr 1.36, CrCl ~ 27 ml/min  Warfarin education by pharmacist completed 4/12.  Diet: carb modified.  Pt eating ~50-100% of meals.  Goal of Therapy:  INR 2-3 Heparin level 0.3-0.7 units/ml Monitor platelets by anticoagulation protocol: Yes   Plan:   Continue heparin IV infusion at 1000 units/hr   Continue heparin until therapeutic INR x24hrs per CHEST guidelines  Warfarin 3.5 mg PO x 1 today  Daily heparin level, CBC, PT/INR   Gretta Arab PharmD, BCPS Pager [phone] 06/17/2013 8:26 AM

## 2013-06-17 NOTE — Progress Notes (Signed)
NUTRITION FOLLOW UP  Intervention:   -Recommend MagicCup TID -Will d/c Boost Plus supplement -Encouraged intake of cold/easy to tolerate proteins -Will continue to monitor  Nutrition Dx:   Inadequate oral intake related to nausea/loss of taste/loose stools as evidenced by PO intake <75%., 13 lb unintentional wt loss-ongoing   Goal:   Pt to meet >/= 90% of their estimated nutrition needs    Monitor:   Total protein/energy intake, labs, weights, GI profile    Assessment:    4/10:-Pt endorsed ongoing nausea for past two weeks. Has had multiple chemo related side effects; including loose stools, nausea,and taste changes  -Was evaluated by outpatient Oncology RD on 3/22. Discussed ways to improve loose stools and heart healthy proteins to incorporate into diet  -Diet recall indicates pt attempting to eat 2-3 small meals/day, but will experience episodes of vomiting several hours after meals  -Breakfast consists of cereal or toast w/peanut butter, juice and fruit. Lunch and dinner are typically different sandwiches such as egg salad, or a soup. Will occasionally eat yogurts for snacks.  -Tried Boost supplement once, and enjoyed chocolate flavor as chocolate is one of her remaining taste sensations. Discussed other ways to improve taste alterations-fresh herbs, fruity marinades  -PO intake 50% of breakfast  -Pt endorsed unintentional wt loss, wt decreased 13 lbs in past one month  -Was willing to try Glucerna shake once daily for additional protein/kcal  -Elevated renal profile d/t hx of renal insufficiency  -Elevated CBG's   4/16: -Received verbal consult from RN to re-assess pt's supplement and PO intake -Pt tried Boost Plus supplement several times after it was initially ordered on 4/10; each time would induce vomiting/nausea. Has since been Boost refusing supplement -RN reported trying other supplement alternatives (Ensure pudding, [employer]). Pt would either experience nausea  or dislike tastes -Pt was willing to try MagicCup supplement as she has been tolerating cold food vs warm foods. Followed up with pt, who confirmed liking and tolerating the supplement taste. Relayed information to RN. -Has been ordering cottage cheese, yogurt for proteins. Will sometimes sleep through breakfast. Tried warm pasta, but was unable tolerate. Is able to consume pot roast, sweet potatoes and soup, but minimal additional warm foods -Loose stools improving -CBG's remain elevated  Height: Ht Readings from Last 1 Encounters:  06/10/13 4\' 10"  (1.473 m)    Weight Status:   Wt Readings from Last 1 Encounters:  06/17/13 173 lb 11.6 oz (78.8 kg)  06/10/13 172 lbs  Re-estimated needs:  Kcal: 1550-1750  Protein: 65-75 gram  Fluid: >/=1600 mldaily  Skin: +1 edemas in RLE and LLE  Diet Order: Carb Control   Intake/Output Summary (Last 24 hours) at 06/17/13 1436 Last data filed at 06/17/13 1400  Gross per 24 hour  Intake    975 ml  Output   1900 ml  Net   -925 ml    Last BM: 4/15   Labs:   Recent [facility] 06/15/13 0435 06/16/13 0356 06/17/13 0347  NA 137 138 137  K 4.6 4.6 5.0  CL 102 103 102  CO2 25 26 29   BUN 18 18 16   CREATININE 1.36* 1.32* 1.36*  CALCIUM 8.9 8.8 8.9  GLUCOSE 197* 188* 200*    CBG (last 3)   Recent Labs  06/16/13 2255 06/17/13 0725 06/17/13 1125  GLUCAP 249* 173* 239*    Scheduled Meds: . atorvastatin  10 mg Oral QHS  . cloNIDine  0.2 mg Oral BID  . insulin aspart  0-5 Units Subcutaneous QHS  . insulin aspart  0-9 Units Subcutaneous TID WC  . potassium chloride  20 mEq Oral Daily  . sodium chloride  3 mL Intravenous Q12H  . traMADol  50 mg Oral BID  . verapamil  240 mg Oral Daily  . warfarin  3.5 mg Oral ONCE-1800  . Warfarin - Pharmacist Dosing Inpatient   Does not apply q1800    Continuous Infusions: . heparin 1,000 Units/hr (06/17/13 0626)    Atlee Abide MS RD LDN Clinical Dietitian [id number]

## 2013-06-18 LAB — GLUCOSE, CAPILLARY
Glucose-Capillary: 196 mg/dL — ABNORMAL HIGH (ref 70–99)
Glucose-Capillary: 233 mg/dL — ABNORMAL HIGH (ref 70–99)
Glucose-Capillary: 259 mg/dL — ABNORMAL HIGH (ref 70–99)
Glucose-Capillary: 202 mg/dL — ABNORMAL HIGH (ref 70–99)

## 2013-06-18 LAB — BASIC METABOLIC PANEL
BUN: 19 mg/dL (ref 6–23)
CO2: 26 mEq/L (ref 19–32)
Calcium: 8.8 mg/dL (ref 8.4–10.5)
Chloride: 103 mEq/L (ref 96–112)
Creatinine, Ser: 1.39 mg/dL — ABNORMAL HIGH (ref 0.50–1.10)
GFR calc Af Amer: 39 mL/min — ABNORMAL LOW (ref >90)
GFR calc non Af Amer: 34 mL/min — ABNORMAL LOW (ref >90)
Glucose, Bld: 216 mg/dL — ABNORMAL HIGH (ref 70–99)
Potassium: 4.9 mEq/L (ref 3.7–5.3)
Sodium: 137 mEq/L (ref 137–147)

## 2013-06-18 LAB — PROTIME-INR
INR: 2.39 — ABNORMAL HIGH (ref 0.00–1.49)
Prothrombin Time: 25.3 seconds — ABNORMAL HIGH (ref 11.6–15.2)

## 2013-06-18 LAB — CBC
HCT: 27.4 % — ABNORMAL LOW (ref 36.0–46.0)
Hemoglobin: 9 g/dL — ABNORMAL LOW (ref 12.0–15.0)
MCH: 30.2 pg (ref 26.0–34.0)
MCHC: 32.8 g/dL (ref 30.0–36.0)
MCV: 91.9 fL (ref 78.0–100.0)
Platelets: 186 10*3/uL (ref 150–400)
RBC: 2.98 MIL/uL — ABNORMAL LOW (ref 3.87–5.11)
RDW: 15.7 % — ABNORMAL HIGH (ref 11.5–15.5)
WBC: 8.9 10*3/uL (ref 4.0–10.5)

## 2013-06-18 LAB — HEPARIN LEVEL (UNFRACTIONATED): Heparin Unfractionated: 0.45 IU/mL (ref 0.30–0.70)

## 2013-06-18 MED ORDER — GI COCKTAIL ~~LOC~~
30.0000 mL | Freq: Two times a day (BID) | ORAL | Status: DC | PRN
  Filled 2013-06-18: qty 30

## 2013-06-18 MED ORDER — WARFARIN SODIUM 3 MG PO TABS
3.0000 mg | ORAL_TABLET | Freq: Once | ORAL | Status: AC
  Administered 2013-06-18: 3 mg via ORAL
  Filled 2013-06-18: qty 1

## 2013-06-18 NOTE — Progress Notes (Signed)
Patient ID: [name], female   DOB: 02-Jul-1926, 80 y.o.   MRN: [id number]  [facility] PROGRESS NOTE  Sabrina [name] [id number] DOB: 11-09-1926 DOA: 06/10/2013 PCP: [name], MD  Brief narrative:  Patient is 80 year old female with history of new diagnosis of T1 N1 right breast cancer , following Dr. Humphrey Rolls, status post lumpectomy, on the chemotherapies started in March 2015, chronic renal insufficiency, hypertension, diabetes mellitus presented to the ER for intractable nausea and vomiting, unable to take her medications, feeling short of breath and noticed her lower extremity edema right worse than left in the last 1 week.   Principal Problem:  Pulmonary embolism, DVT  - clinically stable and feeling well this AM  - on heparin drip with transition to Coumadin  - INR therapeutic this AM, if it remains stable, plan d/c in AM  - plan for more PT today  Active Problems:  Diabetes mellitus  - A1C > 8, will continue SSI while inpatient  - pt is on Metformin at home and this should probably be held until renal function improves  - will consider glipizide on discharge or low dose januvia  Acute on chronic renal failure  - Cr remain at baseline  - repeat CBC in AM  Breast cancer of upper-outer quadrant of right female breast  - she is being followed by Dr. Humphrey Rolls and will follow up as an outpatient  Nausea and vomiting  - possibly related to principal problem  - now resolved and pt tolerating current diet well  - place on GI cocktail  Accelerated hypertension  - currently on clonidine and verapamil  - hydralazine added 4/15 and now BP is stable and at target range  Acute respiratory failure with hypoxia  - secondary to principal problem  - coumadin as noted above  Acute on chronic blood loss anemia  - pt denies any active bleeding  - Hg drop overnight  - repeat CBC in AM   Consultants:  Oncology  Procedures:  2D ECHO EF 65% to XX123456, grade 1 diastolic  dysfunction Antibiotics  None   Code Status: Full  Family Communication: Pt at bedside  Disposition Plan: Home likely in AM  HPI/Subjective: No events overnight.   Objective: Filed Vitals:   06/17/13 1352 06/17/13 2108 06/18/13 0455 06/18/13 1236  BP: 118/62 150/48 146/52 121/47  Pulse: 55 59 55 52  Temp: 97.4 F (36.3 C) 97.7 F (36.5 C) 97.7 F (36.5 C)   TempSrc: Oral Oral Oral   Resp: 18 20 18 16   Height:      Weight:   78 kg (171 lb 15.3 oz)   SpO2: 99% 98% 97% 100%    Intake/Output Summary (Last 24 hours) at 06/18/13 1440 Last data filed at 06/18/13 1336  Gross per 24 hour  Intake 1011.17 ml  Output   1200 ml  Net -188.83 ml    Exam:   General:  Pt is alert, follows commands appropriately, not in acute distress  Cardiovascular: Regular rate and rhythm, S1/S2, no murmurs, no rubs, no gallops  Respiratory: Clear to auscultation bilaterally, no wheezing, no crackles, no rhonchi  Abdomen: Soft, non tender, non distended, bowel sounds present, no guarding  Extremities: No edema, pulses DP and PT palpable bilaterally  Neuro: Grossly nonfocal  Data Reviewed: Basic Metabolic Panel:  Recent [facility] 06/13/13 0508 06/15/13 0435 06/16/13 0356 06/17/13 0347 06/18/13 0420  NA 137 137 138 137 137  K 4.0 4.6 4.6 5.0 4.9  CL 101  102 103 102 103  CO2 29 25 26 29 26   GLUCOSE 172* 197* 188* 200* 216*  BUN 22 18 18 16 19   CREATININE 1.47* 1.36* 1.32* 1.36* 1.39*  CALCIUM 8.6 8.9 8.8 8.9 8.8   CBC:  Recent [facility] 06/14/13 0755 06/15/13 0435 06/16/13 0356 06/17/13 0347 06/18/13 0420  WBC 8.9 9.9 10.0 10.0 8.9  HGB 9.4* 9.5* 9.3* 8.8* 9.0*  HCT 29.3* 29.2* 27.6* 26.5* 27.4*  MCV 93.0 92.7 91.1 93.3 91.9  PLT 181 187 186 167 186   CBG:  Recent [facility] 06/17/13 1125 06/17/13 1703 06/17/13 2120 06/18/13 0725 06/18/13 1141  GLUCAP 239* 212* 188* 196* 233*    Recent Results (from the past 240 hour(s))  CULTURE, BLOOD (ROUTINE X 2)      Status: None   Collection Time    06/10/13  3:50 PM      Result Value Ref Range Status   Specimen Description BLOOD LEFT ANTECUBITAL   Final   Special Requests BOTTLES DRAWN AEROBIC AND ANAEROBIC 5CC   Final   Culture  Setup Time     Final   Value: 06/10/2013 22:25     Performed at [facility]   Culture     Final   Value: NO GROWTH 5 DAYS     Performed at [facility]   Report Status 06/16/2013 FINAL   Final  CULTURE, BLOOD (ROUTINE X 2)     Status: None   Collection Time    06/10/13  4:08 PM      Result Value Ref Range Status   Specimen Description BLOOD L CHEST   Final   Special Requests BOTTLES DRAWN AEROBIC AND ANAEROBIC 5ML   Final   Culture  Setup Time     Final   Value: 06/10/2013 22:25     Performed at [facility]   Culture     Final   Value: NO GROWTH 5 DAYS     Performed at [facility]   Report Status 06/16/2013 FINAL   Final  URINE CULTURE     Status: None   Collection Time    06/11/13 12:29 AM      Result Value Ref Range Status   Specimen Description URINE, CLEAN CATCH   Final   Special Requests NONE   Final   Culture  Setup Time     Final   Value: 06/11/2013 04:11     Performed at [place]     Final   Value: 60,000 COLONIES/ML     Performed at [facility]   Culture     Final   Value: Multiple bacterial morphotypes present, none predominant. Suggest appropriate recollection if clinically indicated.     Performed at [facility]   Report Status 06/12/2013 FINAL   Final     Scheduled Meds: . atorvastatin  10 mg Oral QHS  . cloNIDine  0.2 mg Oral BID  . insulin aspart  0-5 Units Subcutaneous QHS  . insulin aspart  0-9 Units Subcutaneous TID WC  . potassium chloride  20 mEq Oral Daily  . sodium chloride  3 mL Intravenous Q12H  . traMADol  50 mg Oral BID  . verapamil  240 mg Oral Daily  . warfarin  3 mg Oral ONCE-1800  . Warfarin - Pharmacist Dosing Inpatient   Does not apply  q1800   Continuous Infusions:   Theodis Blaze, MD  [facility] Pager [phone]  If 7PM-7AM, please contact night-coverage [email or web address] Password [email or web address] 06/18/2013, 2:40 PM   LOS: 8 days

## 2013-06-18 NOTE — Progress Notes (Signed)
ANTICOAGULATION CONSULT NOTE - Follow Up  Pharmacy Consult for Warfarin / Heparin Indication: PE and DVT  No Known Allergies  Patient Measurements: Height: 4\' 10"  (147.3 cm) Weight: 171 lb 15.3 oz (78 kg) IBW/kg (Calculated) : 40.9 Heparin Dosing Weight: 63.2kg  Vital Signs: Temp: 97.7 F (36.5 C) (04/17 0455) Temp src: Oral (04/17 0455) BP: 146/52 mmHg (04/17 0455) Pulse Rate: 55 (04/17 0455)  Labs:  Recent Labs  06/16/13 0356 06/17/13 0347 06/18/13 0420  HGB 9.3* 8.8* 9.0*  HCT 27.6* 26.5* 27.4*  PLT 186 167 186  LABPROT 19.8* 23.1* 25.3*  INR 1.74* 2.12* 2.39*  HEPARINUNFRC 0.51 0.48 0.45  CREATININE 1.32* 1.36* 1.39*    Estimated Creatinine Clearance: 26.5 ml/min (by C-G formula based on Cr of 1.39).  Warfarin doses: 4/11 5mg , 4/12 2.5mg , 4/13 2.5mg , 4/14 3.5mg , 4/15 3.5mg   Assessment: 80 yo female with breast cancer, on chemotherapy, presents to ED with shortness of breath, nausea and vomiting. VQ scan shows high probability for RLL PE and LE dopplers positive for bilateral DVTs. Pharmacy consulted to dose IV heparin on 4/9, now coumadin added 4/11.  Oncology discussed various anticoagulation options with pt, and warfarin was chosen due to CKD.     4/16:  Day 7 warfarin/heparin bridge.  D2 therapeutic INR.  INR 2.39  Heparin level (0.45) therapeutic this AM  CBC:  Hgb 9, Plt 186.  Remains stable, No bleeding/complications reported.  SCr 1.39, CrCl ~ 26 ml/min  Warfarin education by pharmacist completed 4/12.  Diet: carb modified.  Pt eating ~50-100% of meals.  Goal of Therapy:  INR 2-3 Heparin level 0.3-0.7 units/ml Monitor platelets by anticoagulation protocol: Yes   Plan:   D/C Heparin drip (completed overlap for therapeutic INR x24hrs)  Warfarin 3 mg PO x 1 today  Daily INR  If discharged, would recommend Warfarin 3mg  daily with INR recheck on Monday, 4/20.   Gretta Arab PharmD, BCPS Pager [phone] 06/18/2013 8:00 AM

## 2013-06-18 NOTE — Progress Notes (Signed)
Physical Therapy Treatment Patient Details Name: Sabrina [name] MRN: [id number] DOB: 09-08-1926 Today's Date: 06/18/2013    History of Present Illness Patient is 80 year old female with history of new diagnosis of T1 N1 right breast cancer , following Dr. Humphrey Rolls, status post lumpectomy, on the chemotherapies started in March 2015, chronic renal insufficiency, hypertension, diabetes mellitus presented to the ER for intractable nausea and vomiting since last night and unable to take her medications. She has noticed that she was feeling short of breath and noticed her lower extremity edema right worse than left in the last 1 week.    PT Comments    Pt OOB in recliner requested to use bathroom.  Assisted to [facility] then amb in hallway.  Mild dyspnea noted with RA sats avg 90%.    Follow Up Recommendations  Home health PT     Equipment Recommendations       Recommendations for Other Services       Precautions / Restrictions Precautions Precautions: Fall Precaution Comments: monitor O2 sats Restrictions Weight Bearing Restrictions: No    Mobility  Bed Mobility               General bed mobility comments: Pt OOB in recliner  Transfers Overall transfer level: Needs assistance Equipment used: Rolling walker (2 wheeled) Transfers: Sit to/from Stand Sit to Stand: Supervision         General transfer comment: cues for safe hand placement with RW  Ambulation/Gait Ambulation/Gait assistance: Supervision;Min guard Ambulation Distance (Feet): 125 Feet Assistive device: Rolling walker (2 wheeled) Gait Pattern/deviations: Decreased stride length Gait velocity: decreased   General Gait Details: pt with good safety with obstacle with RW; distance limited by fatigue, required to ambulate slowly for fatigue.  RA avg 90%   Stairs            Wheelchair Mobility    Modified Rankin (Stroke Patients Only)       Balance                                     Cognition                            Exercises      General Comments        Pertinent Vitals/Pain     Home Living                      Prior Function            PT Goals (current goals can now be found in the care plan section) Progress towards PT goals: Progressing toward goals    Frequency  Min 3X/week    PT Plan      Co-evaluation             End of Session Equipment Utilized During Treatment: Gait belt Activity Tolerance: Patient tolerated treatment well;Patient limited by fatigue Patient left: in chair;with call bell/phone within reach     Time: [id number] PT Time Calculation (min): 24 min  Charges:  $Gait Training: 8-22 mins $Therapeutic Activity: 8-22 mins                    G Codes:      Rica Koyanagi  PTA [facility]  Acute  Rehab Pager      [phone]

## 2013-06-18 NOTE — Progress Notes (Signed)
Inpatient Diabetes Program Recommendations  AACE/ADA: New Consensus Statement on Inpatient Glycemic Control (2013)  Target Ranges:  Prepandial:   less than 140 mg/dL      Peak postprandial:   less than 180 mg/dL (1-2 hours)      Critically ill patients:  140 - 180 mg/dL   Reason for Visit: Results for ARTURO, GUTERMUTH (MRN [id number]) as of 06/18/2013 12:01  Ref. Range 06/17/2013 17:03 06/17/2013 21:20 06/18/2013 04:20 06/18/2013 07:25 06/18/2013 11:41  Glucose-Capillary Latest Range: 70-99 mg/dL 212 (H) 188 (H)  196 (H) 233 (H)    Diabetes history: Type 2 diabetes  Outpatient Diabetes medications: Metformin 850 mg bid Current orders for Inpatient glycemic control: Novolog sensitive tid with meals and HS  May consider adding Lantus 10 units daily.  Adah Perl, RN, BC-ADM Inpatient Diabetes Coordinator Pager [phone]

## 2013-06-19 LAB — CBC
HCT: 27.6 % — ABNORMAL LOW (ref 36.0–46.0)
Hemoglobin: 9.2 g/dL — ABNORMAL LOW (ref 12.0–15.0)
MCH: 30.9 pg (ref 26.0–34.0)
MCHC: 33.3 g/dL (ref 30.0–36.0)
MCV: 92.6 fL (ref 78.0–100.0)
Platelets: 153 10*3/uL (ref 150–400)
RBC: 2.98 MIL/uL — ABNORMAL LOW (ref 3.87–5.11)
RDW: 16.1 % — ABNORMAL HIGH (ref 11.5–15.5)
WBC: 7.1 10*3/uL (ref 4.0–10.5)

## 2013-06-19 LAB — BASIC METABOLIC PANEL
BUN: 18 mg/dL (ref 6–23)
CO2: 26 mEq/L (ref 19–32)
Calcium: 8.8 mg/dL (ref 8.4–10.5)
Chloride: 102 mEq/L (ref 96–112)
Creatinine, Ser: 1.51 mg/dL — ABNORMAL HIGH (ref 0.50–1.10)
GFR calc Af Amer: 35 mL/min — ABNORMAL LOW (ref >90)
GFR calc non Af Amer: 31 mL/min — ABNORMAL LOW (ref >90)
Glucose, Bld: 202 mg/dL — ABNORMAL HIGH (ref 70–99)
Potassium: 5.2 mEq/L (ref 3.7–5.3)
Sodium: 138 mEq/L (ref 137–147)

## 2013-06-19 LAB — GLUCOSE, CAPILLARY
Glucose-Capillary: 172 mg/dL — ABNORMAL HIGH (ref 70–99)
Glucose-Capillary: 238 mg/dL — ABNORMAL HIGH (ref 70–99)

## 2013-06-19 LAB — PROTIME-INR
INR: 3.12 — ABNORMAL HIGH (ref 0.00–1.49)
Prothrombin Time: 31 seconds — ABNORMAL HIGH (ref 11.6–15.2)

## 2013-06-19 MED ORDER — HEPARIN SOD (PORK) LOCK FLUSH 100 UNIT/ML IV SOLN
500.0000 [IU] | INTRAVENOUS | Status: DC | PRN
  Administered 2013-06-19: 500 [IU]
  Filled 2013-06-19: qty 5

## 2013-06-19 MED ORDER — HYDROCODONE-ACETAMINOPHEN 5-325 MG PO TABS: 1.0000 | ORAL_TABLET | ORAL | Status: DC | PRN

## 2013-06-19 MED ORDER — HEPARIN SOD (PORK) LOCK FLUSH 100 UNIT/ML IV SOLN
500.0000 [IU] | INTRAVENOUS | Status: DC
  Filled 2013-06-19: qty 5

## 2013-06-19 MED ORDER — SODIUM CHLORIDE 0.9 % IJ SOLN
10.0000 mL | INTRAMUSCULAR | Status: DC | PRN
  Administered 2013-06-19: 10 mL

## 2013-06-19 MED ORDER — GLIPIZIDE 5 MG PO TABS: 5.0000 mg | ORAL_TABLET | Freq: Two times a day (BID) | ORAL | Status: DC

## 2013-06-19 MED ORDER — ONDANSETRON HCL 4 MG PO TABS: 4.0000 mg | ORAL_TABLET | Freq: Three times a day (TID) | ORAL | Status: DC | PRN

## 2013-06-19 MED ORDER — TRAMADOL HCL 50 MG PO TABS: 50.0000 mg | ORAL_TABLET | Freq: Two times a day (BID) | ORAL | Status: DC

## 2013-06-19 MED ORDER — LORAZEPAM 0.5 MG PO TABS: 0.5000 mg | ORAL_TABLET | Freq: Four times a day (QID) | ORAL | Status: DC | PRN

## 2013-06-19 MED ORDER — WARFARIN SODIUM 3 MG PO TABS: 3.0000 mg | ORAL_TABLET | Freq: Every day | ORAL | Status: DC

## 2013-06-19 NOTE — Progress Notes (Signed)
ANTICOAGULATION CONSULT NOTE - Follow Up  Pharmacy Consult for Warfarin Indication: PE and DVT  No Known Allergies  Patient Measurements: Height: 4\' 10"  (147.3 cm) Weight: 174 lb 6.1 oz (79.1 kg) IBW/kg (Calculated) : 40.9 Heparin Dosing Weight: 63.2kg  Vital Signs: Temp: 97.8 F (36.6 C) (04/18 0517) Temp src: Oral (04/18 0517) BP: 143/44 mmHg (04/18 0517) Pulse Rate: 54 (04/18 0517)  Labs:  Recent Labs  06/17/13 0347 06/18/13 0420 06/19/13 0335  HGB 8.8* 9.0* 9.2*  HCT 26.5* 27.4* 27.6*  PLT 167 186 153  LABPROT 23.1* 25.3* 31.0*  INR 2.12* 2.39* 3.12*  HEPARINUNFRC 0.48 0.45  --   CREATININE 1.36* 1.39* 1.51*    Estimated Creatinine Clearance: 24.6 ml/min (by C-G formula based on Cr of 1.51).  Warfarin doses: 4/11 5mg , 4/12 2.5mg , 4/13 2.5mg , 4/14 3.5mg , 4/15 3.5mg , 4/16 3.5mg , 4/16 3mg   Assessment: 80 yo female with breast cancer, on chemotherapy, presents to ED with shortness of breath, nausea and vomiting. VQ scan shows high probability for RLL PE and LE dopplers positive for bilateral DVTs. Pharmacy consulted to dose IV heparin on 4/9, now coumadin added 4/11.  Oncology discussed various anticoagulation options with pt, and warfarin was chosen due to CKD.     INR 2.39 > 3.12  CBC:  Hgb low but stable, Plt 153.  No bleeding/complications reported.  Warfarin education by pharmacist completed 4/12.  Diet: carb modified.  Pt eating ~50-100% of meals.  Goal of Therapy:  INR 2-3 Heparin level 0.3-0.7 units/ml Monitor platelets by anticoagulation protocol: Yes   Plan:   Hold warfarin today.  Anticipate INR may continue to increase tomorrow.  Was increasing steadily and jumped quickly these past two days with 3.5 mg doses.  For discharge, would recommend holding dose today, low dose of 1 mg 4/19 and checking INR on Monday 4/20.  Hershal Coria, PharmD, BCPS Pager: [phone] 06/19/2013 1:12 PM

## 2013-06-19 NOTE — Progress Notes (Signed)
06/19/2013 1200 NCM notified [place] for [facility] for scheduled dc home today. Contacted [facility] DME rep for [employer] for home. Jonnie Finner RN CCM Case Mgmt phone [phone]

## 2013-06-19 NOTE — Discharge Summary (Signed)
Physician Discharge Summary  Sabrina [name] [id number] DOB: [birth year] DOA: 06/10/2013  PCP: [name], MD  Admit date: 06/10/2013 Discharge date: 06/19/2013  Recommendations for Outpatient Follow-up:  1. Pt will need to follow up with PCP in 2-3 weeks post discharge 2. Please obtain BMP to evaluate electrolytes and kidney function 3. Please also check CBC to evaluate Hg and Hct levels 4. Pt needs to have PT/INR checked April 20th to decide on Coumadin dose, [facility] nurse aid, set up to check INR and to call PCP or myself to alert of the value  5. Please note that Metformin was held during the hospital stay due to ARF, may restart one renal function improves  6. Pt started on Glipizide   Discharge Diagnoses: PE, DVT Principal Problem:   Pulmonary embolism Active Problems:   Breast cancer of upper-outer quadrant of right female breast   Edema leg   Nausea and vomiting   Accelerated hypertension   Acute respiratory failure with hypoxia   Discharge Condition: Stable  Diet recommendation: Heart healthy diet discussed in details   Brief narrative:  Patient is 80 year old female with history of new diagnosis of T1 N1 right breast cancer , following Dr. Humphrey Rolls, status post lumpectomy, on the chemotherapies started in March 2015, chronic renal insufficiency, hypertension, diabetes mellitus presented to the ER for intractable nausea and vomiting, unable to take her medications, feeling short of breath and noticed her lower extremity edema right worse than left in the last 1 week.   Principal Problem:  Pulmonary embolism, DVT  - clinically stable and feeling well this AM  - on heparin drip with transition to Coumadin  - INR therapeutic this AM, pt wants to go home  Active Problems:  Diabetes mellitus  - A1C > 8, will continue SSI while inpatient  - pt is on Metformin at home and this should probably be held until renal function improves  - started glipizide on discharge  Acute on  chronic renal failure  - Cr remain at baseline  - repeat CBC in AM  Breast cancer of upper-outer quadrant of right female breast  - she is being followed by Dr. Humphrey Rolls and will follow up as an outpatient  Nausea and vomiting  - possibly related to principal problem  - now resolved and pt tolerating current diet well  Accelerated hypertension  - currently on clonidine and verapamil  - hydralazine added 4/15 and now BP is stable and at target range  Acute respiratory failure with hypoxia  - secondary to principal problem  - coumadin as noted above  Acute on chronic blood loss anemia  - pt denies any active bleeding   Consultants:  Oncology  Procedures:  2D ECHO EF 65% to [id number], grade 1 diastolic dysfunction Antibiotics  None   Code Status: Full  Family Communication: Pt at bedside   Discharge Exam: Filed Vitals:   06/19/13 0517  BP: 143/44  Pulse: 54  Temp: 97.8 F (36.6 C)  Resp: 18   Filed Vitals:   06/18/13 0455 06/18/13 1236 06/18/13 2120 06/19/13 0517  BP: 146/52 121/47 161/64 143/44  Pulse: 55 52 63 54  Temp: 97.7 F (36.5 C)  98.2 F (36.8 C) 97.8 F (36.6 C)  TempSrc: Oral  Oral Oral  Resp: 18 16 18 18   Height:      Weight: 78 kg (171 lb 15.3 oz)   79.1 kg (174 lb 6.1 oz)  SpO2: 97% 100% 99% 97%    General: Pt is  alert, follows commands appropriately, not in acute distress Cardiovascular: Regular rate and rhythm, S1/S2 +, no murmurs, no rubs, no gallops Respiratory: Clear to auscultation bilaterally, no wheezing, no crackles, no rhonchi Abdominal: Soft, non tender, non distended, bowel sounds +, no guarding Extremities: no edema, no cyanosis, pulses palpable bilaterally DP and PT Neuro: Grossly nonfocal  Discharge Instructions   Future Appointments Provider Department Dept Phone   06/21/2013 12:45 PM [facility] [phone]   06/21/2013 1:15 PM Minette Headland, NP [facility] [phone]   06/21/2013 2:15 PM [facility] [phone]       Medication List    STOP taking these medications       cephALEXin 500 MG capsule  Commonly known as:  KEFLEX     metFORMIN 850 MG tablet  Commonly known as:  GLUCOPHAGE      TAKE these medications       atorvastatin 10 MG tablet  Commonly known as:  LIPITOR  Take 10 mg by mouth daily.     cloNIDine 0.2 MG tablet  Commonly known as:  CATAPRES  Take 0.2 mg by mouth 2 (two) times daily.     dexamethasone 4 MG tablet  Commonly known as:  DECADRON  Take 4 mg by mouth 2 (two) times daily. After chemo for 3 days     furosemide 40 MG tablet  Commonly known as:  LASIX  Take 1 tablet (40 mg total) by mouth daily. Take extra tab as needed for swelling     glipiZIDE 5 MG tablet  Commonly known as:  GLUCOTROL  Take 1 tablet (5 mg total) by mouth 2 (two) times daily before a meal.     HYDROcodone-acetaminophen 5-325 MG per tablet  Commonly known as:  NORCO/VICODIN  Take 1-2 tablets by mouth every 4 (four) hours as needed for moderate pain.     IRON PO  Take 65 mg by mouth daily.     lidocaine-prilocaine cream  Commonly known as:  EMLA  Apply 1 - 2 hours to port -a-cath before being accessed.     LORazepam 0.5 MG tablet  Commonly known as:  ATIVAN  Take 1 tablet (0.5 mg total) by mouth every 6 (six) hours as needed (Nausea or vomiting).     nystatin ointment  Commonly known as:  MYCOSTATIN  Apply 1 application topically 2 (two) times daily as needed (skin).     nystatin-triamcinolone cream  Commonly known as:  MYCOLOG II  Apply 1 application topically 2 (two) times daily.     ondansetron 4 MG tablet  Commonly known as:  ZOFRAN  Take 1 tablet (4 mg total) by mouth every 8 (eight) hours as needed for nausea or vomiting.     Potassium Chloride ER 20 MEQ Tbcr  Take 20 mEq by mouth daily.     prochlorperazine 10 MG tablet  Commonly known as:   COMPAZINE  Take 10 mg by mouth every 6 (six) hours as needed for nausea or vomiting.     traMADol 50 MG tablet  Commonly known as:  ULTRAM  Take 1 tablet (50 mg total) by mouth 2 (two) times daily.     verapamil 240 MG 24 hr capsule  Commonly known as:  VERELAN PM  Take 240 mg by mouth daily.     warfarin 3 MG tablet  Commonly known as:  COUMADIN  Take 1 tablet (3  mg total) by mouth daily. Start taking 06/20/2013  Start taking on:  06/20/2013           Follow-up Information   Schedule an appointment as soon as possible for a visit with [name], MD.   Specialty:  Internal Medicine   Contact information:   [address] [place] [address] [phone]       Follow up with Faye Ramsay, MD. (As needed, If symptoms worsen, call my cell phone [phone])    Specialty:  Internal Medicine   Contact information:   [address] [place] [address] [phone]        The results of significant diagnostics from this hospitalization (including imaging, microbiology, ancillary and laboratory) are listed below for reference.     Microbiology: Recent Results (from the past 240 hour(s))  CULTURE, BLOOD (ROUTINE X 2)     Status: None   Collection Time    06/10/13  3:50 PM      Result Value Ref Range Status   Specimen Description BLOOD LEFT ANTECUBITAL   Final   Special Requests BOTTLES DRAWN AEROBIC AND ANAEROBIC 5CC   Final   Culture  Setup Time     Final   Value: 06/10/2013 22:25     Performed at [facility]   Culture     Final   Value: NO GROWTH 5 DAYS     Performed at [facility]   Report Status 06/16/2013 FINAL   Final  CULTURE, BLOOD (ROUTINE X 2)     Status: None   Collection Time    06/10/13  4:08 PM      Result Value Ref Range Status   Specimen Description BLOOD L CHEST   Final   Special Requests BOTTLES DRAWN AEROBIC AND ANAEROBIC 5ML   Final   Culture  Setup Time     Final   Value: 06/10/2013  22:25     Performed at [facility]   Culture     Final   Value: NO GROWTH 5 DAYS     Performed at [facility]   Report Status 06/16/2013 FINAL   Final  URINE CULTURE     Status: None   Collection Time    06/11/13 12:29 AM      Result Value Ref Range Status   Specimen Description URINE, CLEAN CATCH   Final   Special Requests NONE   Final   Culture  Setup Time     Final   Value: 06/11/2013 04:11     Performed at [facility]     Final   Value: 60,000 COLONIES/ML     Performed at [facility]   Culture     Final   Value: Multiple bacterial morphotypes present, none predominant. Suggest appropriate recollection if clinically indicated.     Performed at [facility]   Report Status 06/12/2013 FINAL   Final     Labs: Basic Metabolic Panel:  Recent Labs Lab 06/15/13 0435 06/16/13 0356 06/17/13 0347 06/18/13 0420 06/19/13 0335  NA 137 138 137 137 138  K 4.6 4.6 5.0 4.9 5.2  CL 102 103 102 103 102  CO2 25 26 29 26 26   GLUCOSE 197* 188* 200* 216* 202*  BUN 18 18 16 19 18   CREATININE 1.36* 1.32* 1.36* 1.39* 1.51*  CALCIUM 8.9 8.8 8.9 8.8 8.8   Liver Function Tests: No results found for this basename: AST,  ALT, ALKPHOS, BILITOT, PROT, ALBUMIN,  in the last 168 hours No results found for this basename: LIPASE, AMYLASE,  in the last 168 hours No results found for this basename: AMMONIA,  in the last 168 hours CBC:  Recent Labs Lab 06/15/13 0435 06/16/13 0356 06/17/13 0347 06/18/13 0420 06/19/13 0335  WBC 9.9 10.0 10.0 8.9 7.1  HGB 9.5* 9.3* 8.8* 9.0* 9.2*  HCT 29.2* 27.6* 26.5* 27.4* 27.6*  MCV 92.7 91.1 93.3 91.9 92.6  PLT 187 186 167 186 153   Cardiac Enzymes: No results found for this basename: CKTOTAL, CKMB, CKMBINDEX, TROPONINI,  in the last 168 hours BNP: BNP (last 3 results)  Recent Labs  06/07/13 1215  PROBNP 557.1*   CBG:  Recent Labs Lab 06/18/13 0725 06/18/13 1141 06/18/13 1717  06/18/13 2119 06/19/13 0812  GLUCAP 196* 233* 259* 202* 172*     SIGNED: Time coordinating discharge: Over 30 minutes  Theodis Blaze, MD  [facility] 06/19/2013, 10:37 AM Pager [phone]  If 7PM-7AM, please contact night-coverage [email or web address] Password [email or web address]

## 2013-06-21 ENCOUNTER — Telehealth: Payer: Self-pay | Admitting: Adult Health

## 2013-06-21 ENCOUNTER — Encounter: Payer: Self-pay | Admitting: Genetic Counselor

## 2013-06-21 ENCOUNTER — Other Ambulatory Visit: Payer: Self-pay | Admitting: Adult Health

## 2013-06-21 ENCOUNTER — Telehealth: Payer: Self-pay | Admitting: *Deleted

## 2013-06-21 ENCOUNTER — Other Ambulatory Visit: Payer: Medicare Other

## 2013-06-21 ENCOUNTER — Ambulatory Visit: Payer: Medicare Other | Admitting: Adult Health

## 2013-06-21 ENCOUNTER — Ambulatory Visit: Payer: Medicare Other

## 2013-06-21 NOTE — Progress Notes (Signed)
This is a brief note to document genetic test results given to Ms. Clemons in [place] absence. Ms. Weesner was seen by Santiago Glad for genetic counseling and testing on 05/31/13. Please refer to that note for a complete medical and family history.   GENETIC TESTING: At the time of Ms. Apostol's visit, Santiago Glad recommended she pursue genetic testing of multiple genes on the Breast/Ovarian Cancer Panel. This test, which included sequencing and deletion/duplication analysis of 21 genes, was performed at [facility]. Testing was normal and did not reveal a mutation in any of these genes. The genes tested were APC, ATM, AXIN2, BARD1, BMPRIA, BRCA1, BRCA2, BRIP1, CDH1, CDK4, CDKN2A, CHEK2, EPCAM, FANCC, MLH1, MSH2, MSH6, MUTYH, NBN, PALB2, PMS2, PTEN, RAD51C, SMAD4, STK11, TP53, VHL, and XRCC2. This is an overall reassuring result for Ms. Whitelaw given her personal and family history.   We discussed with Ms. Gowen that since the current test is not perfect, it is possible there may be a gene mutation that current testing cannot detect, but that chance is small. We also discussed that it is possible that a different genetic factor, which has not yet been discovered, is responsible for the cancer diagnoses in the family. Finally, it may be that others in the family who have had cancer at young ages have a detectable genetic mutation that she did not inherit.   CANCER SCREENING: We will defer her screenings to her overseeing providers.   FAMILY MEMBERS: Women in this family are at some increased risk of developing cancer, over the general population risk, simply due to the family history. We recommended they have a yearly mammogram 10 years younger than the youngest breast cancer diagnosis or at age 37 (whichever comes first), a yearly clinical breast exam, a yearly gynecologic exam, and perform monthly breast self-exams. Colon cancer screening is recommended to begin by age 88.   Lastly, we discussed with Ms. Tremper  that cancer genetics is a rapidly advancing field and it is possible that new genetic tests will be appropriate for her in the future. We encouraged her to remain in contact with [place] on an annual basis so we can update her personal and family histories, and let her know of advances in cancer genetics that may benefit the family. Our contact number was provided. Ms. Dunsworth questions were answered to her satisfaction today, and she knows she is welcome to call anytime with additional questions.   Steele Berg, MS, [place]  Certified Genetic Counseor  phone: [phone]  [email or web address]

## 2013-06-21 NOTE — Telephone Encounter (Signed)
Per staff message and POF I have scheduled appts.  [name]  

## 2013-06-30 ENCOUNTER — Encounter: Payer: Self-pay | Admitting: Adult Health

## 2013-06-30 ENCOUNTER — Ambulatory Visit: Payer: Medicare Other

## 2013-06-30 ENCOUNTER — Ambulatory Visit (HOSPITAL_BASED_OUTPATIENT_CLINIC_OR_DEPARTMENT_OTHER): Payer: Medicare Other | Admitting: Adult Health

## 2013-06-30 ENCOUNTER — Other Ambulatory Visit (HOSPITAL_BASED_OUTPATIENT_CLINIC_OR_DEPARTMENT_OTHER): Payer: Medicare Other

## 2013-06-30 ENCOUNTER — Telehealth: Payer: Self-pay | Admitting: Adult Health

## 2013-06-30 VITALS — BP 151/73 | HR 85 | Temp 98.2°F | Resp 20 | Ht <= 58 in | Wt 172.3 lb

## 2013-06-30 DIAGNOSIS — C50411 Malignant neoplasm of upper-outer quadrant of right female breast: Secondary | ICD-10-CM

## 2013-06-30 DIAGNOSIS — Z170 Estrogen receptor positive status [ER+]: Secondary | ICD-10-CM

## 2013-06-30 DIAGNOSIS — C50419 Malignant neoplasm of upper-outer quadrant of unspecified female breast: Secondary | ICD-10-CM

## 2013-06-30 DIAGNOSIS — C50919 Malignant neoplasm of unspecified site of unspecified female breast: Secondary | ICD-10-CM

## 2013-06-30 DIAGNOSIS — Z86711 Personal history of pulmonary embolism: Secondary | ICD-10-CM

## 2013-06-30 NOTE — Progress Notes (Signed)
Hematology and Oncology Follow Up Visit  Sabrina [name] [id number] [birth year] 80 y.o. 07/02/2013 10:05 PM     Principle Diagnosis:Sabrina [name] 80 y.o. female with stage IA triple positive invasive ductal carcinoma of the right breast.   Prior Therapy:  #1screening mammogram patient was found to have a right breast mass measuring 1 cm. There were no abnormalities on her physical exam. Patient had ultrasound performed that showed 1.3 x 1.2 x 1.0 cm hypoechoic mass in the upper outer quadrant of the right breast. In the right axilla there was an abnormal appearing lymph node measuring 1.65 1.4 x 0.8 cm. Both her biopsy. The lymph node was positive for metastatic carcinoma. The primary breast tumor biopsy showed invasive ductal carcinoma, grade 2, ER positive PR positive HER-2/neu equivocal with a proliferation marker Ki-67 15%   #2 status post right lumpectomy with the final pathology revealing 1.4 cm invasive ductal carcinoma grade 3 ER positive PR positive repeat HER-2/neu testing revealed the HER-2 receptor to be positive. She did have history of margin that was positive. She is status post reexcision with Port-A-Cath placement by Dr. Rolm Bookbinder.   #3 adjuvant curative intent chemotherapy: Consisting of Taxol/Herceptin that began 05/10/2013.  She suffered from severe nausea and dehydration after two weeks of Taxol and Herceptin.  Treatment was held since 05/17/13.    #4 Patient hospitalized on 06/10/13 due to a pulmonary embolus.  She was started on a Heparin gtt and bridged to Coumadin daily.     Current therapy: Herceptin on hold  Interim History: Sabrina [name] 80 y.o. female with stage IA triple positive invasive ductal carcinoma of the right breast.  She is recovering from being hospitalized due to a pulmonary embolus.  She is receiving help at home from home health physical therapy and from a care aid, and home health nursing.  They are checking her INR levels.  The patient is concerned  about the weakness, nausea, vomiting, and dehydration that she experienced from the Taxol and Herceptin treatment.  She is hesitant to receive further treatment.  She is feeling better since her hospitalized, and denies any fevers, chills, nausea, vomiting, constipation, diarrhea, easy bruising or bleeding.  She is slowly regaining her strength.    Medications:  Current Outpatient Prescriptions  Medication Sig Dispense Refill  . atorvastatin (LIPITOR) 10 MG tablet Take 10 mg by mouth daily.      . cloNIDine (CATAPRES) 0.2 MG tablet Take 0.2 mg by mouth 2 (two) times daily.      . furosemide (LASIX) 40 MG tablet Take 1 tablet (40 mg total) by mouth daily. Take extra tab as needed for swelling  60 tablet  6  . glipiZIDE (GLUCOTROL) 5 MG tablet Take 1 tablet (5 mg total) by mouth 2 (two) times daily before a meal.  60 tablet  1  . IRON PO Take 65 mg by mouth daily.      . ondansetron (ZOFRAN) 4 MG tablet Take 1 tablet (4 mg total) by mouth every 8 (eight) hours as needed for nausea or vomiting.  65 tablet  0  . potassium chloride 20 MEQ TBCR Take 20 mEq by mouth daily.  30 tablet  3  . traMADol (ULTRAM) 50 MG tablet Take 1 tablet (50 mg total) by mouth 2 (two) times daily.  60 tablet  1  . verapamil (VERELAN PM) 240 MG 24 hr capsule Take 240 mg by mouth daily.       [name] warfarin (COUMADIN) 3  MG tablet Take 1 tablet (3 mg total) by mouth daily. Start taking 06/20/2013  30 tablet  0  . dexamethasone (DECADRON) 4 MG tablet Take 4 mg by mouth 2 (two) times daily. After chemo for 3 days      . HYDROcodone-acetaminophen (NORCO/VICODIN) 5-325 MG per tablet Take 1-2 tablets by mouth every 4 (four) hours as needed for moderate pain.  65 tablet  0  . lidocaine-prilocaine (EMLA) cream Apply 1 - 2 hours to port -a-cath before being accessed.  30 g  6  . LORazepam (ATIVAN) 0.5 MG tablet Take 1 tablet (0.5 mg total) by mouth every 6 (six) hours as needed (Nausea or vomiting).  65 tablet  0  . nystatin ointment  (MYCOSTATIN) Apply 1 application topically 2 (two) times daily as needed (skin).       . nystatin-triamcinolone (MYCOLOG II) cream Apply 1 application topically 2 (two) times daily.       . prochlorperazine (COMPAZINE) 10 MG tablet Take 10 mg by mouth every 6 (six) hours as needed for nausea or vomiting.        No current facility-administered medications for this visit.     Allergies: No Known Allergies  Medical History: Past Medical History  Diagnosis Date  . Heart murmur     no known problems; states did not know she had murmur until age 38  . Immature cataract   . Arthritis     neck  . Wears partial dentures     lower  . Dental crowns present   . Hypertension     fluctuates, especially when stressed; has been on med. > 20 yr.  . Non-insulin dependent type 2 diabetes mellitus   . High cholesterol   . Chronic kidney disease (CKD), stage III (moderate)     nephrologist, Dr. [name]  . Breast cancer 03/2013    right    Surgical History:  Past Surgical History  Procedure Laterality Date  . Tonsillectomy      as a child  . Breast surgery Right 11/1958    right breast biopsy benign  . Dilation and curettage of uterus    . Breast lumpectomy with needle localization Right 03/30/2013    Procedure: BREAST LUMPECTOMY WITH NEEDLE LOCALIZATION;  Surgeon: Rolm Bookbinder, MD;  Location: [facility];  Service: General;  Laterality: Right;  . Axillary lymph node dissection Right 03/30/2013    Procedure: AXILLARY LYMPH NODE DISSECTION;  Surgeon: Rolm Bookbinder, MD;  Location: [facility];  Service: General;  Laterality: Right;  . Re-excision of breast cancer,superior margins Right 04/15/2013    Procedure: RE-EXCISION OF RIGHT BREAST  MARGINS;  Surgeon: Rolm Bookbinder, MD;  Location: [facility];  Service: General;  Laterality: Right;  . Portacath placement N/A 04/15/2013    Procedure: INSERTION PORT-A-CATH;  Surgeon: Rolm Bookbinder, MD;  Location: [facility];  Service: General;  Laterality: N/A;     Review of Systems: A 10 point review of systems was conducted and is otherwise negative except for what is noted above.     Physical Exam: Blood pressure 151/73, pulse 85, temperature 98.2 F (36.8 C), temperature source Oral, resp. rate 20, height 4' 10"  (1.473 m), weight 172 lb 4.8 oz (78.155 kg). GENERAL: Patient is a chronically ill appearing elderly female sitting upright in wheelchair HEENT:  Unable to assess, patient actively vomiting LUNGS:  Clear to auscultation bilaterally.  No wheezes or rhonchi. HEART:  Regular rhythm. No murmur appreciated. Slightly tachycardic ABDOMEN:  Soft, nontender.  Positive, normoactive bowel sounds. No organomegaly palpated. EXTREMITIES:  1+pedal edema.   SKIN:  Clear with no obvious rashes or skin changes. No nail dyscrasia. NEURO:  Nonfocal. Well oriented.  Appropriate affect. ECOG PERFORMANCE STATUS: 2 - Symptomatic, <50% confined to bed   Lab Results: Lab Results  Component Value Date   WBC 7.1 06/19/2013   HGB 9.2* 06/19/2013   HCT 27.6* 06/19/2013   MCV 92.6 06/19/2013   PLT 153 06/19/2013     Chemistry      Component Value Date/Time   NA 138 06/19/2013 0335   NA 141 06/07/2013 0954   K 5.2 06/19/2013 0335   K 3.8 06/07/2013 0954   CL 102 06/19/2013 0335   CO2 26 06/19/2013 0335   CO2 29 06/07/2013 0954   BUN 18 06/19/2013 0335   BUN 48.5* 06/07/2013 0954   CREATININE 1.51* 06/19/2013 0335   CREATININE 2.1* 06/07/2013 0954      Component Value Date/Time   CALCIUM 8.8 06/19/2013 0335   CALCIUM 9.8 06/07/2013 0954   ALKPHOS 94 06/10/2013 1446   ALKPHOS 98 06/07/2013 0954   AST 14 06/10/2013 1446   AST 16 06/07/2013 0954   ALT 25 06/10/2013 1446   ALT 34 06/07/2013 0954   BILITOT 0.5 06/10/2013 1446   BILITOT 0.63 06/07/2013 0954     Assessment and Plan: Sabrina [name] 80 y.o. female with  1. Stage IIA triple positive invasive ductal carcinoma of the right breast.  The patient is s/p lumpectomy and was begun  on adjuvant Taxol/Herceptin that began on 3/9.  She received treatment for two weeks, however became acutely ill and then suffered a pulmonary embolus and is currently taking Coumadin daily.  I recommended that she not receive any further Taxol.  We discussed her receiving Herceptin once every three weeks.  For now, she has decided that she would like to continue to recover.  4. Cardiac. The patient underwent an echocardiogram on 04/22/13 that demonstrated a LVEF of 65-70%.  She underwent evaluation by Dr. [name] in the cardio-onc clinic.  She was cleared to continue Herceptin and was recommended 3 month follow up with a repeat echocardiogram.  Due to her PE she did undergo another echocardiogram while in the hospital on 06/12/2013.  It demonstrated a LVEF of 65-70%.    The patient will return in 3 weeks for labs, evaluation, and possible Herceptin therapy.   She knows to call us in the interim for any questions or concerns.  We can certainly see her sooner if needed.  I spent 25 minutes counseling the patient face to face.  The total time spent in the appointment was 30 minutes.  Minette Headland, [place] [phone] 07/02/2013 10:05 PM

## 2013-06-30 NOTE — Telephone Encounter (Signed)
, °

## 2013-07-19 ENCOUNTER — Encounter (HOSPITAL_COMMUNITY): Payer: Self-pay | Admitting: Cardiology

## 2013-07-19 ENCOUNTER — Telehealth (HOSPITAL_COMMUNITY): Payer: Self-pay | Admitting: Cardiology

## 2013-07-19 NOTE — Telephone Encounter (Signed)
Attempting to schedule 3 month follow up with ECHO I have been unable to reach this patient by phone.  A letter is being sent to the last known home address.

## 2013-07-22 ENCOUNTER — Telehealth: Payer: Self-pay | Admitting: Adult Health

## 2013-07-22 NOTE — Telephone Encounter (Signed)
, °

## 2013-07-28 ENCOUNTER — Ambulatory Visit: Payer: Medicare Other | Admitting: Adult Health

## 2013-07-28 ENCOUNTER — Other Ambulatory Visit: Payer: Medicare Other

## 2013-08-06 ENCOUNTER — Other Ambulatory Visit (HOSPITAL_COMMUNITY): Payer: Self-pay | Admitting: Cardiology

## 2013-08-06 DIAGNOSIS — C50919 Malignant neoplasm of unspecified site of unspecified female breast: Secondary | ICD-10-CM

## 2013-08-10 ENCOUNTER — Telehealth: Payer: Self-pay | Admitting: Oncology

## 2013-08-10 ENCOUNTER — Other Ambulatory Visit (HOSPITAL_BASED_OUTPATIENT_CLINIC_OR_DEPARTMENT_OTHER): Payer: Medicare Other

## 2013-08-10 ENCOUNTER — Ambulatory Visit (HOSPITAL_BASED_OUTPATIENT_CLINIC_OR_DEPARTMENT_OTHER): Payer: Medicare Other | Admitting: Adult Health

## 2013-08-10 ENCOUNTER — Encounter: Payer: Self-pay | Admitting: Adult Health

## 2013-08-10 VITALS — BP 138/64 | HR 109 | Temp 97.7°F | Resp 18 | Ht <= 58 in | Wt 167.6 lb

## 2013-08-10 DIAGNOSIS — C50419 Malignant neoplasm of upper-outer quadrant of unspecified female breast: Secondary | ICD-10-CM

## 2013-08-10 DIAGNOSIS — C50919 Malignant neoplasm of unspecified site of unspecified female breast: Secondary | ICD-10-CM

## 2013-08-10 DIAGNOSIS — E2839 Other primary ovarian failure: Secondary | ICD-10-CM

## 2013-08-10 DIAGNOSIS — C50411 Malignant neoplasm of upper-outer quadrant of right female breast: Secondary | ICD-10-CM

## 2013-08-10 DIAGNOSIS — C773 Secondary and unspecified malignant neoplasm of axilla and upper limb lymph nodes: Secondary | ICD-10-CM

## 2013-08-10 LAB — COMPREHENSIVE METABOLIC PANEL (CC13)
ALT: 17 U/L (ref 0–55)
AST: 13 U/L (ref 5–34)
Albumin: 3.7 g/dL (ref 3.5–5.0)
Alkaline Phosphatase: 85 U/L (ref 40–150)
Anion Gap: 13 mEq/L — ABNORMAL HIGH (ref 3–11)
BUN: 43.6 mg/dL — ABNORMAL HIGH (ref 7.0–26.0)
CO2: 26 mEq/L (ref 22–29)
Calcium: 9.4 mg/dL (ref 8.4–10.4)
Chloride: 102 mEq/L (ref 98–109)
Creatinine: 1.8 mg/dL — ABNORMAL HIGH (ref 0.6–1.1)
Glucose: 265 mg/dl — ABNORMAL HIGH (ref 70–140)
Potassium: 4.1 mEq/L (ref 3.5–5.1)
Sodium: 141 mEq/L (ref 136–145)
Total Bilirubin: 0.8 mg/dL (ref 0.20–1.20)
Total Protein: 6.8 g/dL (ref 6.4–8.3)

## 2013-08-10 LAB — CBC WITH DIFFERENTIAL/PLATELET
BASO%: 0.8 % (ref 0.0–2.0)
Basophils Absolute: 0.1 10*3/uL (ref 0.0–0.1)
EOS%: 1.7 % (ref 0.0–7.0)
Eosinophils Absolute: 0.1 10*3/uL (ref 0.0–0.5)
HCT: 34.4 % — ABNORMAL LOW (ref 34.8–46.6)
HGB: 11.1 g/dL — ABNORMAL LOW (ref 11.6–15.9)
LYMPH%: 32.9 % (ref 14.0–49.7)
MCH: 29.5 pg (ref 25.1–34.0)
MCHC: 32.3 g/dL (ref 31.5–36.0)
MCV: 91.2 fL (ref 79.5–101.0)
MONO#: 0.6 10*3/uL (ref 0.1–0.9)
MONO%: 6.9 % (ref 0.0–14.0)
NEUT#: 5.1 10*3/uL (ref 1.5–6.5)
NEUT%: 57.7 % (ref 38.4–76.8)
Platelets: 269 10*3/uL (ref 145–400)
RBC: 3.77 10*6/uL (ref 3.70–5.45)
RDW: 15.3 % — ABNORMAL HIGH (ref 11.2–14.5)
WBC: 8.8 10*3/uL (ref 3.9–10.3)
lymph#: 2.9 10*3/uL (ref 0.9–3.3)

## 2013-08-10 MED ORDER — ANASTROZOLE 1 MG PO TABS: 1.0000 mg | ORAL_TABLET | Freq: Every day | ORAL | Status: DC

## 2013-08-10 NOTE — Patient Instructions (Signed)

## 2013-08-10 NOTE — Progress Notes (Addendum)
Hematology and Oncology Follow Up Visit  Sabrina [name] [id number] [birth year] 80 y.o. 08/10/2013 10:06 AM  PCP: [name], MD GYN: SU:  OTHER MD: Thea Silversmith  CHIEF COMPLAINT: Triple positive breast cancer CURRENT TREATMENT: Anastrozole  #1screening mammogram patient was found to have a right breast mass measuring 1 cm. There were no abnormalities on her physical exam. Patient had ultrasound performed that showed 1.3 x 1.2 x 1.0 cm hypoechoic mass in the upper outer quadrant of the right breast. In the right axilla there was an abnormal appearing lymph node measuring 1.65 1.4 x 0.8 cm. Both her biopsy. The lymph node was positive for metastatic carcinoma. The primary breast tumor biopsy showed invasive ductal carcinoma, grade 2, ER positive PR positive HER-2/neu equivocal with a proliferation marker Ki-67 15%   #2 status post right lumpectomy with the final pathology revealing 1.4 cm invasive ductal carcinoma grade 3 ER positive PR positive repeat HER-2/neu testing revealed the HER-2 receptor to be positive. She did have history of margin that was positive. She is status post reexcision with Port-A-Cath placement by Dr. Rolm Bookbinder.   #3 adjuvant curative intent chemotherapy: Consisting of Taxol/Herceptin that began 05/10/2013.  She suffered from severe nausea and dehydration after two weeks of Taxol and Herceptin.  Taxol was held since 05/17/13, and she last received Herceptin on 05/31/13.  #4 Patient hospitalized on 06/10/13 due to a pulmonary embolus.  She was started on a Heparin gtt and bridged to Coumadin daily.    Her subsequent history is as detailed below    Interim History: Sabrina [name] 80 y.o. female with stage IA triple positive invasive ductal carcinoma of the right breast.  She had previously been hospitalized due to a pulmonary embolus.  She had such difficulty with Taxol and Herceptin, that she had requested to stop the Herceptin.  She does have follow up with  Dr. [name] tomorrow.  She has completed her physical therapy through May to regain her strength from her April hospitalization.  She continues to take Coumadin daily and is tolerating it well.  She denies any easy bruising or bleeding.  She denies fevers, chills, nausea, vomiting, shortness of breath, chest pain, palpitations.    Medications:  Current Outpatient Prescriptions  Medication Sig Dispense Refill  . atorvastatin (LIPITOR) 10 MG tablet Take 10 mg by mouth daily.      . cloNIDine (CATAPRES) 0.2 MG tablet Take 0.2 mg by mouth 2 (two) times daily.      . furosemide (LASIX) 40 MG tablet Take 1 tablet (40 mg total) by mouth daily. Take extra tab as needed for swelling  60 tablet  6  . glipiZIDE (GLUCOTROL) 5 MG tablet Take 1 tablet (5 mg total) by mouth 2 (two) times daily before a meal.  60 tablet  1  . IRON PO Take 65 mg by mouth daily.      . potassium chloride 20 MEQ TBCR Take 20 mEq by mouth daily.  30 tablet  3  . verapamil (VERELAN PM) 240 MG 24 hr capsule Take 240 mg by mouth daily.       [name] warfarin (COUMADIN) 3 MG tablet Take 1 tablet (3 mg total) by mouth daily. Start taking 06/20/2013  30 tablet  0  . dexamethasone (DECADRON) 4 MG tablet Take 4 mg by mouth 2 (two) times daily. After chemo for 3 days      . HYDROcodone-acetaminophen (NORCO/VICODIN) 5-325 MG per tablet Take 1-2 tablets by mouth every 4 (four)  hours as needed for moderate pain.  65 tablet  0  . lidocaine-prilocaine (EMLA) cream Apply 1 - 2 hours to port -a-cath before being accessed.  30 g  6  . LORazepam (ATIVAN) 0.5 MG tablet Take 1 tablet (0.5 mg total) by mouth every 6 (six) hours as needed (Nausea or vomiting).  65 tablet  0  . nystatin ointment (MYCOSTATIN) Apply 1 application topically 2 (two) times daily as needed (skin).       . nystatin-triamcinolone (MYCOLOG II) cream Apply 1 application topically 2 (two) times daily.       . ondansetron (ZOFRAN) 4 MG tablet Take 1 tablet (4 mg total) by mouth every 8  (eight) hours as needed for nausea or vomiting.  65 tablet  0  . prochlorperazine (COMPAZINE) 10 MG tablet Take 10 mg by mouth every 6 (six) hours as needed for nausea or vomiting.       . traMADol (ULTRAM) 50 MG tablet Take 1 tablet (50 mg total) by mouth 2 (two) times daily.  60 tablet  1   No current facility-administered medications for this visit.     Allergies: No Known Allergies  Medical History: Past Medical History  Diagnosis Date  . Heart murmur     no known problems; states did not know she had murmur until age 89  . Immature cataract   . Arthritis     neck  . Wears partial dentures     lower  . Dental crowns present   . Hypertension     fluctuates, especially when stressed; has been on med. > 20 yr.  . Non-insulin dependent type 2 diabetes mellitus   . High cholesterol   . Chronic kidney disease (CKD), stage III (moderate)     nephrologist, Dr. [name]  . Breast cancer 03/2013    right    Surgical History:  Past Surgical History  Procedure Laterality Date  . Tonsillectomy      as a child  . Breast surgery Right 11/1958    right breast biopsy benign  . Dilation and curettage of uterus    . Breast lumpectomy with needle localization Right 03/30/2013    Procedure: BREAST LUMPECTOMY WITH NEEDLE LOCALIZATION;  Surgeon: Rolm Bookbinder, MD;  Location: [facility];  Service: General;  Laterality: Right;  . Axillary lymph node dissection Right 03/30/2013    Procedure: AXILLARY LYMPH NODE DISSECTION;  Surgeon: Rolm Bookbinder, MD;  Location: [facility];  Service: General;  Laterality: Right;  . Re-excision of breast cancer,superior margins Right 04/15/2013    Procedure: RE-EXCISION OF RIGHT BREAST  MARGINS;  Surgeon: Rolm Bookbinder, MD;  Location: [facility];  Service: General;  Laterality: Right;  . Portacath placement N/A 04/15/2013    Procedure: INSERTION PORT-A-CATH;  Surgeon: Rolm Bookbinder, MD;  Location: [facility];  Service:  General;  Laterality: N/A;     Review of Systems: A 10 point review of systems was conducted and is otherwise negative except for what is noted above.     Physical Exam: Blood pressure 182/74, pulse 109, temperature 97.7 F (36.5 C), temperature source Oral, resp. rate 18, height _0  (1.473 m), weight 167 lb 9.6 oz (76.023 kg). GENERAL: Patient is a well appearing elderly female in no acute distress HEENT:  Sclerae anicteric.  Oropharynx clear and moist. No ulcerations or evidence of oropharyngeal candidiasis. Neck is supple.  NODES:  No cervical, supraclavicular, or axillary lymphadenopathy palpated.  BREAST EXAM:  Deferred. LUNGS:  Clear to auscultation bilaterally.  No wheezes or rhonchi. HEART:  Regular rate and rhythm. No murmur appreciated. ABDOMEN:  Soft, nontender.  Positive, normoactive bowel sounds. No organomegaly palpated. MSK:  No focal spinal tenderness to palpation. Full range of motion bilaterally in the upper extremities. EXTREMITIES:  No peripheral edema.   SKIN:  Clear with no obvious rashes or skin changes. No nail dyscrasia. NEURO:  Nonfocal. Well oriented.  Appropriate affect. ECOG PERFORMANCE STATUS: 2 - Symptomatic, <50% confined to bed   Lab Results: Lab Results  Component Value Date   WBC 8.8 08/10/2013   HGB 11.1* 08/10/2013   HCT 34.4* 08/10/2013   MCV 91.2 08/10/2013   PLT 269 08/10/2013     Chemistry      Component Value Date/Time   NA 141 08/10/2013 0912   NA 138 06/19/2013 0335   K 4.1 08/10/2013 0912   K 5.2 06/19/2013 0335   CL 102 06/19/2013 0335   CO2 26 08/10/2013 0912   CO2 26 06/19/2013 0335   BUN 43.6* 08/10/2013 0912   BUN 18 06/19/2013 0335   CREATININE 1.8* 08/10/2013 0912   CREATININE 1.51* 06/19/2013 0335      Component Value Date/Time   CALCIUM 9.4 08/10/2013 0912   CALCIUM 8.8 06/19/2013 0335   ALKPHOS 85 08/10/2013 0912   ALKPHOS 94 06/10/2013 1446   AST 13 08/10/2013 0912   AST 14 06/10/2013 1446   ALT 17 08/10/2013 0912   ALT 25 06/10/2013 1446    BILITOT 0.80 08/10/2013 0912   BILITOT 0.5 06/10/2013 1446     Assessment and Plan: Kaysea L Cozort 80 y.o. female with  1. Stage IIA triple positive invasive ductal carcinoma of the right breast.  The patient is s/p lumpectomy and was begun on adjuvant Taxol/Herceptin that began on 3/9.  The patient had a very rough time tolerating the Taxol and was subsequently hospitalized.  She also developed a PE and is anticoagulated on Coumadin.  Due to these factors, she will no longer receive Taxol.  She looks better today than I have seen her in the past.  I reviewed her plan with Dr. Jana Hakim, and we will do the following:    --Arimidex 29m daily: This was reviewed in detail with the patient and detailed information was given regarding this in her AVS. --Referral to Dr. WPablo Ledgerfor adjuvant radiation --Bone density --Herceptin therapy restarted on 09/07/13.    2. Cardiac. The patient underwent an echocardiogram on 04/22/13 that demonstrated a LVEF of 65-70%.  She underwent evaluation by Dr. BHaroldine Lawsin the cardio-onc clinic.  She was cleared to continue Herceptin and was recommended 3 month follow up with a repeat echocardiogram.  Due to her PE she did undergo another echocardiogram while in the hospital on 06/12/2013.  It demonstrated a LVEF of 65-70%.  She does have f/u with Dr. BHaroldine Lawson 08/11/13.  The patient will return on 7/28 for labs and evaluation by Dr. MJana Hakim    She knows to call [facility] the interim for any questions or concerns.  We can certainly see her sooner if needed.  I spent 25 minutes counseling the patient face to face.  The total time spent in the appointment was 30 minutes.  LMinette Headland [phone]/11/2013 10:06 AM   ADDENDUM: 80y.o.   woman s/p right lumpectomy and sentinel lymph node sampling 03/30/2013 for a pT1c pN0, stage IA invasive ductal carcinoma, grade 3, estrogen and progesterone  receptor both 100%  positive, with HER-2 amplification (signals ratio 2.54), and an Mib-1 of 15%  (1) truncated adjuvant chemotherapy consisted of paclitaxel and trastuzumab given for 2 doses, last dose 05/17/2013, with very poor tolerance  (2) trastuzumab every 3 weeks resumed 09/07/2013, to be continued through June of 2016; most recent echocardiogram 08/11/2013 showed a well preserved ejection fraction.  (3) adjuvant radiation completed 10/20/2013  (4) anastrozole started June 2015; DEXA scan 08/17/2013 showed significant osteopenia with a T score of -2.1  (5) acute right lower leg DVT and right lung lower lobe pulmonary emboli diagnosed 06/10/2013; on Coumadin  I discussed the overall situation with Chassity. She understands even though her chemotherapy could not be continued, nevertheless her overall prognosis is very good. Antiestrogen 6 cut the risk of recurrence in half, as does anti-HER-2 treatment. By contrast chemotherapy only cuts the risk of recurrence by one third.  As there is no further plan for chemotherapy, she is ready to start antiestrogen, and given her history of clots tamoxifen is out of the question. Accordingly the plan will be for anastrozole. We discussed the possible toxicities, side effects and complications of this agent. She will need special attention with regards to worsening her already significant osteopenia.  I personally saw this patient and performed a substantive portion of this encounter with the listed APP documented above.   Chauncey Cruel, MD

## 2013-08-10 NOTE — Telephone Encounter (Signed)
, °

## 2013-08-11 ENCOUNTER — Telehealth: Payer: Self-pay | Admitting: *Deleted

## 2013-08-11 ENCOUNTER — Ambulatory Visit (HOSPITAL_COMMUNITY)
Admission: RE | Admit: 2013-08-11 | Discharge: 2013-08-11 | Disposition: A | Discharge status: Home / Self Care | Payer: Medicare Other | Source: Ambulatory Visit | Attending: Geriatric Medicine | Admitting: Geriatric Medicine

## 2013-08-11 ENCOUNTER — Ambulatory Visit (HOSPITAL_BASED_OUTPATIENT_CLINIC_OR_DEPARTMENT_OTHER)
Admission: RE | Admit: 2013-08-11 | Discharge: 2013-08-11 | Disposition: A | Discharge status: Home / Self Care | Payer: Medicare Other | Source: Ambulatory Visit | Attending: Internal Medicine | Admitting: Internal Medicine

## 2013-08-11 VITALS — BP 156/60 | HR 94 | Wt 167.2 lb

## 2013-08-11 DIAGNOSIS — I359 Nonrheumatic aortic valve disorder, unspecified: Secondary | ICD-10-CM | POA: Insufficient documentation

## 2013-08-11 DIAGNOSIS — I350 Nonrheumatic aortic (valve) stenosis: Secondary | ICD-10-CM

## 2013-08-11 DIAGNOSIS — I10 Essential (primary) hypertension: Secondary | ICD-10-CM | POA: Insufficient documentation

## 2013-08-11 DIAGNOSIS — C50919 Malignant neoplasm of unspecified site of unspecified female breast: Secondary | ICD-10-CM

## 2013-08-11 DIAGNOSIS — I059 Rheumatic mitral valve disease, unspecified: Secondary | ICD-10-CM

## 2013-08-11 DIAGNOSIS — C50411 Malignant neoplasm of upper-outer quadrant of right female breast: Secondary | ICD-10-CM

## 2013-08-11 DIAGNOSIS — I2699 Other pulmonary embolism without acute cor pulmonale: Secondary | ICD-10-CM

## 2013-08-11 DIAGNOSIS — C50419 Malignant neoplasm of upper-outer quadrant of unspecified female breast: Secondary | ICD-10-CM

## 2013-08-11 NOTE — Telephone Encounter (Signed)
Per staff message and POF I have scheduled appts.  [name]  

## 2013-08-11 NOTE — Addendum Note (Signed)
Encounter addended by: Scarlette Calico, RN on: 08/11/2013  2:20 PM<BR>     Documentation filed: Patient Instructions Section

## 2013-08-11 NOTE — Progress Notes (Signed)
Patient ID: [name], female   DOB: May 05, 1926, 80 y.o.   MRN: [id number]  PCP: Dr Felipa Eth Oncologist: Dr. Chancy Milroy  HPI: Ms Daquila is an 80 year old with a history of DM, HTN, S/P R breast lumpectomy and porta cath. Stage II (T1 N1) invasive ductal carcinoma,grade 3 ER PR positive HER-2/neu positive.    Plan to Taxol on a weekly basis for total of 12 weeks with Herceptin given weekly as well. We would then change the Herceptin to every 3 week to complete total of one year of therapy.Plan to start March   Echo 04/21/13; EF 60-65% Grade I DD. Significant MAC. Mild aortic stenosis. Lateral s' 8.6 GLS -15%          08/11/13: EF 60% Grade I DD Significant MAC. Mild aortic stenosis. Lateral s' 10.1 GLS -21.7%  Follow up for Breast Cancer: Since last visit was admitted to the hospital for SOB and was found to have a PE/DVT and was started on Coumadin. Denies SOB, orthopnea or CP. Has not had any chemo or Herceptin since end of March. Recently started anastrazole. Will plan to start Herceptin back in early July. Takes lasix as needed.    FH: No family history of heart disease.  SH: Lives at home with husband. Nonsmoker.   ROS: All systems negative except as listed in HPI, PMH and Problem List.  Past Medical History  Diagnosis Date  . Heart murmur     no known problems; states did not know she had murmur until age 80  . Immature cataract   . Arthritis     neck  . Wears partial dentures     lower  . Dental crowns present   . Hypertension     fluctuates, especially when stressed; has been on med. > 20 yr.  . Non-insulin dependent type 2 diabetes mellitus   . High cholesterol   . Chronic kidney disease (CKD), stage III (moderate)     nephrologist, Dr. [name]  . Breast cancer 03/2013    right    Current Outpatient Prescriptions  Medication Sig Dispense Refill  . anastrozole (ARIMIDEX) 1 MG tablet Take 1 tablet (1 mg total) by mouth daily.  30 tablet  3  . atorvastatin  (LIPITOR) 10 MG tablet Take 10 mg by mouth daily.      . cloNIDine (CATAPRES) 0.2 MG tablet Take 0.2 mg by mouth 2 (two) times daily.      [name] dexamethasone (DECADRON) 4 MG tablet Take 4 mg by mouth 2 (two) times daily. After chemo for 3 days      . furosemide (LASIX) 40 MG tablet Take 1 tablet (40 mg total) by mouth daily. Take extra tab as needed for swelling  60 tablet  6  . glipiZIDE (GLUCOTROL) 5 MG tablet Take 1 tablet (5 mg total) by mouth 2 (two) times daily before a meal.  60 tablet  1  . HYDROcodone-acetaminophen (NORCO/VICODIN) 5-325 MG per tablet Take 1-2 tablets by mouth every 4 (four) hours as needed for moderate pain.  65 tablet  0  . IRON PO Take 65 mg by mouth daily.      [name] lidocaine-prilocaine (EMLA) cream Apply 1 - 2 hours to port -a-cath before being accessed.  30 g  6  . LORazepam (ATIVAN) 0.5 MG tablet Take 1 tablet (0.5 mg total) by mouth every 6 (six) hours as needed (Nausea or vomiting).  65 tablet  0  . nystatin ointment (MYCOSTATIN) Apply 1  application topically 2 (two) times daily as needed (skin).       . nystatin-triamcinolone (MYCOLOG II) cream Apply 1 application topically 2 (two) times daily.       . ondansetron (ZOFRAN) 4 MG tablet Take 1 tablet (4 mg total) by mouth every 8 (eight) hours as needed for nausea or vomiting.  65 tablet  0  . potassium chloride 20 MEQ TBCR Take 20 mEq by mouth daily.  30 tablet  3  . prochlorperazine (COMPAZINE) 10 MG tablet Take 10 mg by mouth every 6 (six) hours as needed for nausea or vomiting.       . traMADol (ULTRAM) 50 MG tablet Take 1 tablet (50 mg total) by mouth 2 (two) times daily.  60 tablet  1  . verapamil (VERELAN PM) 240 MG 24 hr capsule Take 240 mg by mouth daily.       [name] warfarin (COUMADIN) 3 MG tablet Take 1 tablet (3 mg total) by mouth daily. Start taking 06/20/2013  30 tablet  0   No current facility-administered medications for this encounter.    Filed Vitals:   08/11/13 1410  BP: 156/60  Pulse: 94  Weight: 167  lb 4 oz (75.864 kg)  SpO2: 96%     PHYSICAL EXAM: General:  Elderly. Walks with cane. No resp difficulty HEENT: normal. alopecic Neck: supple. JVP flat. Carotids 2+ bilaterally; no bruits. No lymphadenopathy or thryomegaly appreciated. Cor: PMI normal. Regular rate & rhythm. Soft AS murmur Lungs: clear Abdomen: obese. soft, nontender, nondistended. No hepatosplenomegaly. No bruits or masses. Good bowel sounds. Extremities: no cyanosis, clubbing, rash, tr edema Neuro: alert & orientedx3, cranial nerves grossly intact. Moves all 4 extremities w/o difficulty. Affect pleasant.  ASSESSMENT & PLAN:  1. Breast cancer -  I reviewed echos personally. EF and Doppler parameters stable. No HF on exam. Continue Herceptin.  2. HTN - BP labile. Can increase clonidine as needed or given DM2 could consider ACE-I or ARB (previously had cough with ACE)  3. DM2 - stable 4. LE edema - Encouraged her to take extra lasix as needed for several days to keep fluid down. Watch salty foods. Take Kcl 20 with each dose of lasix.  5. Aortic stenosis - mild. Continue to follow.  6. PE/DVT - on coumadin  Benay Spice 2:17 PM

## 2013-08-11 NOTE — Progress Notes (Signed)
Echocardiogram 2D Echocardiogram has been performed.  Sabrina [name] 08/11/2013, 1:20 PM

## 2013-08-11 NOTE — Patient Instructions (Signed)
We will contact you in 4 months to schedule your next appointment and echocardiogram  

## 2013-08-16 ENCOUNTER — Telehealth: Payer: Self-pay | Admitting: Oncology

## 2013-08-16 NOTE — Telephone Encounter (Signed)
, °

## 2013-08-17 ENCOUNTER — Ambulatory Visit
Admission: RE | Admit: 2013-08-17 | Discharge: 2013-08-17 | Disposition: A | Discharge status: Home / Self Care | Payer: Medicare Other | Source: Ambulatory Visit | Attending: Adult Health | Admitting: Adult Health

## 2013-08-17 DIAGNOSIS — E2839 Other primary ovarian failure: Secondary | ICD-10-CM

## 2013-08-18 ENCOUNTER — Telehealth: Payer: Self-pay | Admitting: Oncology

## 2013-08-18 NOTE — Telephone Encounter (Signed)
Faxed pt medical records to [facility] °

## 2013-08-23 NOTE — Progress Notes (Signed)
Location of Breast Cancer:Right Breast invasive ductal carcinoma 1.4 cm in the upper-outer quadrant grade 2.  Histology per Pathology Report: 04/15/2013 Re-excision Diagnosis Breast, excision, Right - BENIGN ADIPOSE TISSUE WITH FOCAL FAT NECROSIS. - BENIGN SKELETAL MUSCLE. - THERE IS NO EVIDENCE OF MALIGNANCY. - SEE COMMENT.  1/27/2015Diagnosis 1. Breast, lumpectomy, Right - INVASIVE DUCTAL CARCINOMA, SEE COMMENT. - LYMPHOVASCULAR INVASION IDENTIFIED. - TUMOR IS BROADLY PRESENT AT LATERAL MARGIN. - TUMOR IS FOCALLY PRESENT AT POSTERIOR MARGIN. - SEE TUMOR SYNOPTIC TEMPLATE BELOW. 2. Lymph nodes, regional resection, Right axillary - ONE LYMPH NODE, POSITIVE FOR METASTATIC MAMMARY CARCINOMA (1/3). - INTRANODAL TUMOR DEPOSIT IS 1.3 CM. - EXTRACAPSULAR TUMOR EXTENSION IDENTIFIED. 3. Breast, excision, Additional lateral right - BENIGN BREAST TISSUE, SEE COMMENT. 1 of 4 FINAL for [place] ([id number]) Diagnosis(continued) - NEGATIVE FOR ATYPIA OR MALIGNANCY. - MICROCALCIFICATIONS IDENTIFIED. 4. Breast, excision, Additional superior margin right - BENIGN BREAST TISSUE, SEE COMMENT. - NEGATIVE FOR ATYPIA OR MALIGNANCY. - MICROCALCIFICATIONS IDENTIFIED. Microscopic Comment   Receptor Status: ER(+), PR (+), Her2-neu (+)  Did patient present with symptoms (if so, please note symptoms) or was this found on screening mammography?:  Past/Anticipated interventions by surgeon, if BPZ:WCHEN breast lumpectomy with needle localization by Dr.Matthew Donne Hazel on 03/30/2013. Porta-cath insertion on 21/02/2014. Re-excision of breast margins on 04/15/2013.  Past/Anticipated interventions by medical oncology, if any: Chemotherapyconsisted of Taxol/Herceptin Current plan is to start arimidex daily, perform bone density test and resume herceptin therapy on 09/07/2013.Started arimidex 2 weeks ago.  Lymphedema issues, if any: no   Pain issues, if any:No  SAFETY ISSUES:  Prior radiation? Skin  cancer 30 years  Pacemaker/ICD? No  Possible current pregnancy?No  Is the patient on methotrexate?No   Current Complaints / other details:Married.Menarche age 38.No children.Last menstrual period in 1994.Took HRT for 20 years and quit hormone replacement therapy 20 years ago.   Patient developed pulmonary emboli during chemotherapy subsequently leading to hospitalization and later had  physical  therapy.    Arlyss Repress, RN 08/23/2013,3:57 PM

## 2013-08-25 ENCOUNTER — Ambulatory Visit
Admission: RE | Admit: 2013-08-25 | Discharge: 2013-08-25 | Disposition: A | Discharge status: Home / Self Care | Payer: Medicare Other | Source: Ambulatory Visit | Attending: Radiation Oncology | Admitting: Radiation Oncology

## 2013-08-25 ENCOUNTER — Encounter: Payer: Self-pay | Admitting: Radiation Oncology

## 2013-08-25 ENCOUNTER — Other Ambulatory Visit: Payer: Self-pay | Admitting: Physician Assistant

## 2013-08-25 DIAGNOSIS — Z510 Encounter for antineoplastic radiation therapy: Secondary | ICD-10-CM | POA: Insufficient documentation

## 2013-08-25 DIAGNOSIS — C50919 Malignant neoplasm of unspecified site of unspecified female breast: Secondary | ICD-10-CM | POA: Diagnosis not present

## 2013-08-25 DIAGNOSIS — Z9221 Personal history of antineoplastic chemotherapy: Secondary | ICD-10-CM | POA: Insufficient documentation

## 2013-08-25 DIAGNOSIS — Z86711 Personal history of pulmonary embolism: Secondary | ICD-10-CM | POA: Insufficient documentation

## 2013-08-25 DIAGNOSIS — C50411 Malignant neoplasm of upper-outer quadrant of right female breast: Secondary | ICD-10-CM

## 2013-08-25 DIAGNOSIS — E162 Hypoglycemia, unspecified: Secondary | ICD-10-CM | POA: Insufficient documentation

## 2013-08-25 DIAGNOSIS — Z7901 Long term (current) use of anticoagulants: Secondary | ICD-10-CM | POA: Diagnosis not present

## 2013-08-25 NOTE — Progress Notes (Signed)
Department of Radiation Oncology  Phone:  [phone] Fax:        [phone]   Name: Sabrina [name] MRN: [id number]  DOB: 09-14-1926  Date: 08/25/2013  Follow Up Visit Note  Diagnosis: T1N1 triple positive right breast cancer   Interval History: Sabrina [name] presents today for routine followup.  She finished her chemotherapy with nausea and dehydration. She had a pulmonary embolus and was placed on anticoagulation.   Allergies: No Known Allergies  Medications:  Current Outpatient Prescriptions  Medication Sig Dispense Refill  . anastrozole (ARIMIDEX) 1 MG tablet Take 1 tablet (1 mg total) by mouth daily.  30 tablet  3  . atorvastatin (LIPITOR) 10 MG tablet Take 10 mg by mouth daily.      . calcium-vitamin D (OSCAL WITH D) 250-125 MG-UNIT per tablet Take 1 tablet by mouth 2 (two) times daily.      . cholecalciferol (VITAMIN D) 1000 UNITS tablet Take 1,000 Units by mouth daily.      . cloNIDine (CATAPRES) 0.2 MG tablet Take 0.2 mg by mouth 2 (two) times daily.      . furosemide (LASIX) 40 MG tablet Take 1 tablet (40 mg total) by mouth daily. Take extra tab as needed for swelling  60 tablet  6  . glipiZIDE (GLUCOTROL) 5 MG tablet Take 1 tablet (5 mg total) by mouth 2 (two) times daily before a meal.  60 tablet  1  . HYDROcodone-acetaminophen (NORCO/VICODIN) 5-325 MG per tablet Take 1-2 tablets by mouth every 4 (four) hours as needed for moderate pain.  65 tablet  0  . IRON PO Take 65 mg by mouth daily.      [name] lidocaine-prilocaine (EMLA) cream Apply 1 - 2 hours to port -a-cath before being accessed.  30 g  6  . nystatin ointment (MYCOSTATIN) Apply 1 application topically 2 (two) times daily as needed (skin).       . nystatin-triamcinolone (MYCOLOG II) cream Apply 1 application topically 2 (two) times daily.       . potassium chloride 20 MEQ TBCR Take 20 mEq by mouth daily.  30 tablet  3  . traMADol (ULTRAM) 50 MG tablet Take 1 tablet (50 mg total) by mouth 2 (two) times daily.  60 tablet  1   . verapamil (VERELAN PM) 240 MG 24 hr capsule Take 240 mg by mouth daily.       [name] warfarin (COUMADIN) 3 MG tablet Take 1 tablet (3 mg total) by mouth daily. Start taking 06/20/2013  30 tablet  0  . dexamethasone (DECADRON) 4 MG tablet Take 4 mg by mouth 2 (two) times daily. After chemo for 3 days      . LORazepam (ATIVAN) 0.5 MG tablet Take 1 tablet (0.5 mg total) by mouth every 6 (six) hours as needed (Nausea or vomiting).  65 tablet  0  . ondansetron (ZOFRAN) 4 MG tablet Take 1 tablet (4 mg total) by mouth every 8 (eight) hours as needed for nausea or vomiting.  65 tablet  0  . prochlorperazine (COMPAZINE) 10 MG tablet Take 10 mg by mouth every 6 (six) hours as needed for nausea or vomiting.        No current facility-administered medications for this encounter.    Physical Exam:   Appears well.   IMPRESSION: Sabrina [name] is a 80 y.o. female with a right breast cancer T1N1  PLAN:   I spoke to the patient today regarding her diagnosis and options for treatment. We discussed the  role of radiation in decreasing local failures in patients who undergo lumpectomy. We discussed treatment of her remaining lymph nodes. We discussed the process of simulation and the placement tattoos. We discussed 6 weeks of treatment as an outpatient. We discussed the possibility of asymptomatic lung damage. We discussed the low likelihood of secondary malignancies. We discussed the possible side effects including but not limited to skin redness, fatigue, permanent skin darkening, and breast swelling.    Thea Silversmith, MD

## 2013-08-25 NOTE — Progress Notes (Signed)
Please see the Nurse Progress Note in the MD Initial Consult Encounter for this patient. 

## 2013-08-26 ENCOUNTER — Ambulatory Visit
Admission: RE | Admit: 2013-08-26 | Discharge: 2013-08-26 | Disposition: A | Discharge status: Home / Self Care | Payer: Medicare Other | Source: Ambulatory Visit | Attending: Radiation Oncology | Admitting: Radiation Oncology

## 2013-08-26 DIAGNOSIS — C50411 Malignant neoplasm of upper-outer quadrant of right female breast: Secondary | ICD-10-CM

## 2013-08-26 DIAGNOSIS — Z510 Encounter for antineoplastic radiation therapy: Secondary | ICD-10-CM | POA: Diagnosis not present

## 2013-08-27 NOTE — Progress Notes (Signed)
Name: Sabrina [name]   MRN: [id number]  Date:  08/27/2013  DOB: 02/23/1927  Status:outpatient   DIAGNOSIS: Right Breast cancer.  CONSENT VERIFIED: yes SET UP: Patient is setup supine  IMMOBILIZATION:  The following immobilization was used:Custom Moldable Pillow, breast board.  NARRATIVE: [name] was brought to the [facility].  Identity was confirmed.  All relevant records and images related to the planned course of therapy were reviewed.  Then, the patient was positioned in a stable reproducible clinical set-up for radiation therapy.  Wires were placed to delineate the clinical extent of breast tissue. A wire was placed on the scar as well.  CT images were obtained.  An isocenter was placed. Skin markings were placed.  The position of the heart was then analyzed.  Due to the proximity of the heart to the chest wall, I felt she would benefit from deep inspiration breath hold for cardiac sparing.  She was then coached and rescanned in the breath hold position.  Acceptable cardiac sparing was achieved. The CT images were loaded into the planning software where the target and avoidance structures were contoured.  The radiation prescription was entered and confirmed. The patient was discharged in stable condition and tolerated simulation well.    TREATMENT PLANNING NOTE/3D Simulation Note Treatment planning then occurred. I have requested : MLC's, isodose plan, basic dose calculation  3D simulation was performed.  I personally supervised and oversaw the construction of 5 medically necessary complex treatment devices in the form of MLCs which will be used for beam modification purposes and to protect critical structures including the heart and lung as well as the immobilization devices which will be used to ensure reproducible set up.  I have requested a dose volume histogram of the heart lung and tumor cavity.    RESPIRATORY MOTION MANAGEMENT SIMULATION - Deep Inspiration Breath  Hold  NARRATIVE:  In order to account for effect of respiratory motion on target structures and other organs in the planning and delivery of radiotherapy, this patient underwent respiratory motion management simulation.  To accomplish this, when the patient was brought to the CT simulation planning suite, a bellows was placed on the her abdomen.  Wave forms of the patient's breathing were obtained. Coaching was performed and practice sessions initiated to monitor her ability to obtain and maintain deep inspiration breath hold.  The CT images were loaded into the planning software and fused with her free breathing images by physics.  Acceptable cardiac sparing was achieved through the use of deep inspiration breath hold.  Planning will be performed on her breath hold scan

## 2013-08-27 NOTE — Progress Notes (Signed)
Radiation Oncology         [phone] ________________________________  Name: Sabrina [name]      MRN: [id number]          Date: 08/26/2013              DOB: 1926-09-03  Optical Surface Tracking Plan:  Since intensity modulated radiotherapy (IMRT) and 3D conformal radiation treatment methods are predicated on accurate and precise positioning for treatment, intrafraction motion monitoring is medically necessary to ensure accurate and safe treatment delivery.  The ability to quantify intrafraction motion without excessive ionizing radiation dose can only be performed with optical surface tracking. Accordingly, surface imaging offers the opportunity to obtain 3D measurements of patient position throughout IMRT and 3D treatments without excessive radiation exposure.  I am ordering optical surface tracking for this patient's upcoming course of radiotherapy. ________________________________ Signature   Reference:   Ursula Alert, J, et al. Surface imaging-based analysis of intrafraction motion for breast radiotherapy patients.Journal of [place], n. 6, nov. 2014. ISSN [id number].   Available at: <http://www.jacmp.org/index.php/jacmp/article/view/4957>.

## 2013-08-31 DIAGNOSIS — Z510 Encounter for antineoplastic radiation therapy: Secondary | ICD-10-CM | POA: Diagnosis not present

## 2013-09-02 ENCOUNTER — Ambulatory Visit
Admission: RE | Admit: 2013-09-02 | Discharge: 2013-09-02 | Disposition: A | Discharge status: Home / Self Care | Payer: Medicare Other | Source: Ambulatory Visit | Attending: Radiation Oncology | Admitting: Radiation Oncology

## 2013-09-02 DIAGNOSIS — Z510 Encounter for antineoplastic radiation therapy: Secondary | ICD-10-CM | POA: Diagnosis not present

## 2013-09-02 DIAGNOSIS — C50411 Malignant neoplasm of upper-outer quadrant of right female breast: Secondary | ICD-10-CM

## 2013-09-02 NOTE — Progress Notes (Signed)
  Radiation Oncology         [phone] ________________________________  Name: Sabrina [name] MRN: [id number]  Date: 09/02/2013  DOB: [birth year]  Simulation Verification Note  Status: outpatient  NARRATIVE: The patient was brought to the treatment unit and placed in the planned treatment position. The clinical setup was verified. Then port films were obtained and uploaded to the radiation oncology medical record software.  The treatment beams were carefully compared against the planned radiation fields. The position location and shape of the radiation fields was reviewed. They targeted volume of tissue appears to be appropriately covered by the radiation beams. Organs at risk appear to be excluded as planned.  Based on my personal review, I approved the simulation verification. The patient's treatment will proceed as planned.  -----------------------------------  Blair Promise, PhD, MD

## 2013-09-06 ENCOUNTER — Ambulatory Visit
Admission: RE | Admit: 2013-09-06 | Discharge: 2013-09-06 | Disposition: A | Discharge status: Home / Self Care | Payer: Medicare Other | Source: Ambulatory Visit | Attending: Radiation Oncology | Admitting: Radiation Oncology

## 2013-09-06 DIAGNOSIS — Z510 Encounter for antineoplastic radiation therapy: Secondary | ICD-10-CM | POA: Diagnosis not present

## 2013-09-07 ENCOUNTER — Other Ambulatory Visit (HOSPITAL_BASED_OUTPATIENT_CLINIC_OR_DEPARTMENT_OTHER): Payer: Medicare Other

## 2013-09-07 ENCOUNTER — Ambulatory Visit (HOSPITAL_BASED_OUTPATIENT_CLINIC_OR_DEPARTMENT_OTHER): Payer: Medicare Other

## 2013-09-07 ENCOUNTER — Other Ambulatory Visit: Payer: Self-pay | Admitting: Oncology

## 2013-09-07 ENCOUNTER — Ambulatory Visit
Admission: RE | Admit: 2013-09-07 | Discharge: 2013-09-07 | Disposition: A | Discharge status: Home / Self Care | Payer: Medicare Other | Source: Ambulatory Visit | Attending: Radiation Oncology | Admitting: Radiation Oncology

## 2013-09-07 VITALS — BP 159/52 | Temp 97.5°F

## 2013-09-07 DIAGNOSIS — Z5112 Encounter for antineoplastic immunotherapy: Secondary | ICD-10-CM

## 2013-09-07 DIAGNOSIS — Z510 Encounter for antineoplastic radiation therapy: Secondary | ICD-10-CM | POA: Diagnosis not present

## 2013-09-07 DIAGNOSIS — C50419 Malignant neoplasm of upper-outer quadrant of unspecified female breast: Secondary | ICD-10-CM

## 2013-09-07 DIAGNOSIS — C50411 Malignant neoplasm of upper-outer quadrant of right female breast: Secondary | ICD-10-CM

## 2013-09-07 DIAGNOSIS — C50919 Malignant neoplasm of unspecified site of unspecified female breast: Secondary | ICD-10-CM

## 2013-09-07 LAB — CBC WITH DIFFERENTIAL/PLATELET
BASO%: 0.6 % (ref 0.0–2.0)
Basophils Absolute: 0 10*3/uL (ref 0.0–0.1)
EOS%: 1.4 % (ref 0.0–7.0)
Eosinophils Absolute: 0.1 10*3/uL (ref 0.0–0.5)
HCT: 32.4 % — ABNORMAL LOW (ref 34.8–46.6)
HGB: 10.5 g/dL — ABNORMAL LOW (ref 11.6–15.9)
LYMPH%: 29.6 % (ref 14.0–49.7)
MCH: 29.5 pg (ref 25.1–34.0)
MCHC: 32.5 g/dL (ref 31.5–36.0)
MCV: 90.6 fL (ref 79.5–101.0)
MONO#: 0.5 10*3/uL (ref 0.1–0.9)
MONO%: 7 % (ref 0.0–14.0)
NEUT#: 4.3 10*3/uL (ref 1.5–6.5)
NEUT%: 61.4 % (ref 38.4–76.8)
Platelets: 244 10*3/uL (ref 145–400)
RBC: 3.58 10*6/uL — ABNORMAL LOW (ref 3.70–5.45)
RDW: 14.6 % — ABNORMAL HIGH (ref 11.2–14.5)
WBC: 7 10*3/uL (ref 3.9–10.3)
lymph#: 2.1 10*3/uL (ref 0.9–3.3)

## 2013-09-07 LAB — COMPREHENSIVE METABOLIC PANEL (CC13)
ALT: 14 U/L (ref 0–55)
AST: 13 U/L (ref 5–34)
Albumin: 3.6 g/dL (ref 3.5–5.0)
Alkaline Phosphatase: 81 U/L (ref 40–150)
Anion Gap: 11 mEq/L (ref 3–11)
BUN: 45.1 mg/dL — ABNORMAL HIGH (ref 7.0–26.0)
CO2: 28 mEq/L (ref 22–29)
Calcium: 9.9 mg/dL (ref 8.4–10.4)
Chloride: 102 mEq/L (ref 98–109)
Creatinine: 1.8 mg/dL — ABNORMAL HIGH (ref 0.6–1.1)
Glucose: 208 mg/dl — ABNORMAL HIGH (ref 70–140)
Potassium: 4.5 mEq/L (ref 3.5–5.1)
Sodium: 141 mEq/L (ref 136–145)
Total Bilirubin: 0.55 mg/dL (ref 0.20–1.20)
Total Protein: 6.6 g/dL (ref 6.4–8.3)

## 2013-09-07 MED ORDER — SODIUM CHLORIDE 0.9 % IJ SOLN
10.0000 mL | INTRAMUSCULAR | Status: DC | PRN
  Administered 2013-09-07: 10 mL
  Filled 2013-09-07: qty 10

## 2013-09-07 MED ORDER — TRASTUZUMAB CHEMO INJECTION 440 MG
6.0000 mg/kg | Freq: Once | INTRAVENOUS | Status: AC
  Administered 2013-09-07: 462 mg via INTRAVENOUS
  Filled 2013-09-07: qty 22

## 2013-09-07 MED ORDER — DIPHENHYDRAMINE HCL 25 MG PO CAPS
25.0000 mg | ORAL_CAPSULE | Freq: Once | ORAL | Status: AC
  Administered 2013-09-07: 25 mg via ORAL

## 2013-09-07 MED ORDER — RADIAPLEXRX EX GEL
Freq: Once | CUTANEOUS | Status: AC
  Administered 2013-09-07: 17:00:00 via TOPICAL

## 2013-09-07 MED ORDER — SODIUM CHLORIDE 0.9 % IV SOLN
Freq: Once | INTRAVENOUS | Status: AC
  Administered 2013-09-07: 14:00:00 via INTRAVENOUS

## 2013-09-07 MED ORDER — ALRA NON-METALLIC DEODORANT (RAD-ONC)
1.0000 "application " | Freq: Once | TOPICAL | Status: AC
  Administered 2013-09-07: 1 via TOPICAL

## 2013-09-07 MED ORDER — PROCHLORPERAZINE EDISYLATE 5 MG/ML IJ SOLN
INTRAMUSCULAR | Status: AC
  Filled 2013-09-07: qty 2

## 2013-09-07 MED ORDER — ACETAMINOPHEN 325 MG PO TABS
ORAL_TABLET | ORAL | Status: AC
  Filled 2013-09-07: qty 2

## 2013-09-07 MED ORDER — PROCHLORPERAZINE EDISYLATE 5 MG/ML IJ SOLN
10.0000 mg | Freq: Four times a day (QID) | INTRAMUSCULAR | Status: DC | PRN
  Administered 2013-09-07: 10 mg via INTRAVENOUS

## 2013-09-07 MED ORDER — DIPHENHYDRAMINE HCL 25 MG PO CAPS
ORAL_CAPSULE | ORAL | Status: AC
  Filled 2013-09-07: qty 1

## 2013-09-07 MED ORDER — HEPARIN SOD (PORK) LOCK FLUSH 100 UNIT/ML IV SOLN
500.0000 [IU] | Freq: Once | INTRAVENOUS | Status: AC | PRN
  Administered 2013-09-07: 500 [IU]
  Filled 2013-09-07: qty 5

## 2013-09-07 MED ORDER — ACETAMINOPHEN 325 MG PO TABS
650.0000 mg | ORAL_TABLET | Freq: Once | ORAL | Status: AC
  Administered 2013-09-07: 650 mg via ORAL

## 2013-09-07 NOTE — Patient Instructions (Signed)
[facility] Discharge Instructions for Patients Receiving Chemotherapy  Today you received the following chemotherapy agents Herceptin.  To help prevent nausea and vomiting after your treatment, we encourage you to take your nausea medication as prescribed.   If you develop nausea and vomiting that is not controlled by your nausea medication, call the clinic.   BELOW ARE SYMPTOMS THAT SHOULD BE REPORTED IMMEDIATELY:  *FEVER GREATER THAN 100.5 F  *CHILLS WITH OR WITHOUT FEVER  NAUSEA AND VOMITING THAT IS NOT CONTROLLED WITH YOUR NAUSEA MEDICATION  *UNUSUAL SHORTNESS OF BREATH  *UNUSUAL BRUISING OR BLEEDING  TENDERNESS IN MOUTH AND THROAT WITH OR WITHOUT PRESENCE OF ULCERS  *URINARY PROBLEMS  *BOWEL PROBLEMS  UNUSUAL RASH Items with * indicate a potential emergency and should be followed up as soon as possible.  Feel free to call the clinic you have any questions or concerns. The clinic phone number is [phone].    

## 2013-09-07 NOTE — Progress Notes (Signed)
Weekly Management Note Current Dose: 3.6  Gy  Projected Dose: 61 Gy   Narrative:  The patient presents for routine under treatment assessment.  CBCT/MVCT images/Port film x-rays were reviewed.  The chart was checked. Doing well. No complaints. Infusion today.   Physical Findings: Weight:  . Unchanged  Impression:  The patient is tolerating radiation.  Plan:  Continue treatment as planned. Continue RT. Discussed skin care.

## 2013-09-08 ENCOUNTER — Ambulatory Visit
Admission: RE | Admit: 2013-09-08 | Discharge: 2013-09-08 | Disposition: A | Discharge status: Home / Self Care | Payer: Medicare Other | Source: Ambulatory Visit | Attending: Radiation Oncology | Admitting: Radiation Oncology

## 2013-09-08 DIAGNOSIS — Z510 Encounter for antineoplastic radiation therapy: Secondary | ICD-10-CM | POA: Diagnosis not present

## 2013-09-09 ENCOUNTER — Ambulatory Visit
Admission: RE | Admit: 2013-09-09 | Discharge: 2013-09-09 | Disposition: A | Discharge status: Home / Self Care | Payer: Medicare Other | Source: Ambulatory Visit | Attending: Radiation Oncology | Admitting: Radiation Oncology

## 2013-09-09 DIAGNOSIS — Z510 Encounter for antineoplastic radiation therapy: Secondary | ICD-10-CM | POA: Diagnosis not present

## 2013-09-10 ENCOUNTER — Ambulatory Visit
Admission: RE | Admit: 2013-09-10 | Discharge: 2013-09-10 | Disposition: A | Discharge status: Home / Self Care | Payer: Medicare Other | Source: Ambulatory Visit | Attending: Radiation Oncology | Admitting: Radiation Oncology

## 2013-09-10 DIAGNOSIS — Z510 Encounter for antineoplastic radiation therapy: Secondary | ICD-10-CM | POA: Diagnosis not present

## 2013-09-13 ENCOUNTER — Observation Stay (HOSPITAL_COMMUNITY)
Admission: EM | Admit: 2013-09-13 | Discharge: 2013-09-14 | Disposition: A | Discharge status: Home / Self Care | Payer: Medicare Other | Attending: Internal Medicine | Admitting: Internal Medicine

## 2013-09-13 ENCOUNTER — Ambulatory Visit
Admission: RE | Admit: 2013-09-13 | Discharge: 2013-09-13 | Disposition: A | Discharge status: Home / Self Care | Payer: Medicare Other | Source: Ambulatory Visit | Attending: Radiation Oncology | Admitting: Radiation Oncology

## 2013-09-13 ENCOUNTER — Encounter (HOSPITAL_COMMUNITY): Payer: Self-pay | Admitting: Emergency Medicine

## 2013-09-13 DIAGNOSIS — I2699 Other pulmonary embolism without acute cor pulmonale: Secondary | ICD-10-CM

## 2013-09-13 DIAGNOSIS — N183 Chronic kidney disease, stage 3 unspecified: Secondary | ICD-10-CM | POA: Insufficient documentation

## 2013-09-13 DIAGNOSIS — E1129 Type 2 diabetes mellitus with other diabetic kidney complication: Secondary | ICD-10-CM

## 2013-09-13 DIAGNOSIS — E1169 Type 2 diabetes mellitus with other specified complication: Principal | ICD-10-CM | POA: Insufficient documentation

## 2013-09-13 DIAGNOSIS — R4182 Altered mental status, unspecified: Secondary | ICD-10-CM | POA: Insufficient documentation

## 2013-09-13 DIAGNOSIS — C50411 Malignant neoplasm of upper-outer quadrant of right female breast: Secondary | ICD-10-CM

## 2013-09-13 DIAGNOSIS — C50919 Malignant neoplasm of unspecified site of unspecified female breast: Secondary | ICD-10-CM | POA: Insufficient documentation

## 2013-09-13 DIAGNOSIS — I359 Nonrheumatic aortic valve disorder, unspecified: Secondary | ICD-10-CM | POA: Diagnosis not present

## 2013-09-13 DIAGNOSIS — Z86718 Personal history of other venous thrombosis and embolism: Secondary | ICD-10-CM | POA: Diagnosis not present

## 2013-09-13 DIAGNOSIS — Z7901 Long term (current) use of anticoagulants: Secondary | ICD-10-CM | POA: Insufficient documentation

## 2013-09-13 DIAGNOSIS — J9601 Acute respiratory failure with hypoxia: Secondary | ICD-10-CM

## 2013-09-13 DIAGNOSIS — E162 Hypoglycemia, unspecified: Secondary | ICD-10-CM

## 2013-09-13 DIAGNOSIS — Z79899 Other long term (current) drug therapy: Secondary | ICD-10-CM | POA: Insufficient documentation

## 2013-09-13 DIAGNOSIS — I129 Hypertensive chronic kidney disease with stage 1 through stage 4 chronic kidney disease, or unspecified chronic kidney disease: Secondary | ICD-10-CM | POA: Insufficient documentation

## 2013-09-13 DIAGNOSIS — R609 Edema, unspecified: Secondary | ICD-10-CM | POA: Diagnosis not present

## 2013-09-13 DIAGNOSIS — I10 Essential (primary) hypertension: Secondary | ICD-10-CM

## 2013-09-13 DIAGNOSIS — I350 Nonrheumatic aortic (valve) stenosis: Secondary | ICD-10-CM

## 2013-09-13 LAB — CBC WITH DIFFERENTIAL/PLATELET
Basophils Absolute: 0 10*3/uL (ref 0.0–0.1)
Basophils Relative: 0 % (ref 0–1)
Eosinophils Absolute: 0 10*3/uL (ref 0.0–0.7)
Eosinophils Relative: 0 % (ref 0–5)
HCT: 33.2 % — ABNORMAL LOW (ref 36.0–46.0)
Hemoglobin: 11.1 g/dL — ABNORMAL LOW (ref 12.0–15.0)
Lymphocytes Relative: 12 % (ref 12–46)
Lymphs Abs: 1.3 10*3/uL (ref 0.7–4.0)
MCH: 29.8 pg (ref 26.0–34.0)
MCHC: 33.4 g/dL (ref 30.0–36.0)
MCV: 89.2 fL (ref 78.0–100.0)
Monocytes Absolute: 0.8 10*3/uL (ref 0.1–1.0)
Monocytes Relative: 7 % (ref 3–12)
Neutro Abs: 8.7 10*3/uL — ABNORMAL HIGH (ref 1.7–7.7)
Neutrophils Relative %: 81 % — ABNORMAL HIGH (ref 43–77)
Platelets: 283 10*3/uL (ref 150–400)
RBC: 3.72 MIL/uL — ABNORMAL LOW (ref 3.87–5.11)
RDW: 14.3 % (ref 11.5–15.5)
WBC: 10.8 10*3/uL — ABNORMAL HIGH (ref 4.0–10.5)

## 2013-09-13 LAB — URINALYSIS, ROUTINE W REFLEX MICROSCOPIC
Bilirubin Urine: NEGATIVE
Glucose, UA: 100 mg/dL — AB
Hgb urine dipstick: NEGATIVE
Ketones, ur: NEGATIVE mg/dL
Nitrite: NEGATIVE
Protein, ur: NEGATIVE mg/dL
Specific Gravity, Urine: 1.01 (ref 1.005–1.030)
Urobilinogen, UA: 0.2 mg/dL (ref 0.0–1.0)
pH: 7 (ref 5.0–8.0)

## 2013-09-13 LAB — GLUCOSE, CAPILLARY
Glucose-Capillary: 77 mg/dL (ref 70–99)
Glucose-Capillary: 63 mg/dL — ABNORMAL LOW (ref 70–99)
Glucose-Capillary: 85 mg/dL (ref 70–99)
Glucose-Capillary: 93 mg/dL (ref 70–99)
Glucose-Capillary: 91 mg/dL (ref 70–99)
Glucose-Capillary: 76 mg/dL (ref 70–99)
Glucose-Capillary: 189 mg/dL — ABNORMAL HIGH (ref 70–99)

## 2013-09-13 LAB — BASIC METABOLIC PANEL
Anion gap: 13 (ref 5–15)
BUN: 49 mg/dL — ABNORMAL HIGH (ref 6–23)
CO2: 28 mEq/L (ref 19–32)
Calcium: 9.4 mg/dL (ref 8.4–10.5)
Chloride: 103 mEq/L (ref 96–112)
Creatinine, Ser: 1.41 mg/dL — ABNORMAL HIGH (ref 0.50–1.10)
GFR calc Af Amer: 38 mL/min — ABNORMAL LOW (ref >90)
GFR calc non Af Amer: 33 mL/min — ABNORMAL LOW (ref >90)
Glucose, Bld: 56 mg/dL — ABNORMAL LOW (ref 70–99)
Potassium: 4 mEq/L (ref 3.7–5.3)
Sodium: 144 mEq/L (ref 137–147)

## 2013-09-13 LAB — HEPATIC FUNCTION PANEL
ALT: 16 U/L (ref 0–35)
AST: 17 U/L (ref 0–37)
Albumin: 3.6 g/dL (ref 3.5–5.2)
Alkaline Phosphatase: 74 U/L (ref 39–117)
Bilirubin, Direct: <0.2 mg/dL (ref 0.0–0.3)
Total Bilirubin: 0.6 mg/dL (ref 0.3–1.2)
Total Protein: 6.5 g/dL (ref 6.0–8.3)

## 2013-09-13 LAB — CBG MONITORING, ED
Glucose-Capillary: 93 mg/dL (ref 70–99)
Glucose-Capillary: 35 mg/dL — CL (ref 70–99)
Glucose-Capillary: 142 mg/dL — ABNORMAL HIGH (ref 70–99)
Glucose-Capillary: 104 mg/dL — ABNORMAL HIGH (ref 70–99)

## 2013-09-13 LAB — HEMOGLOBIN A1C
Hgb A1c MFr Bld: 6.6 % — ABNORMAL HIGH (ref <5.7)
Mean Plasma Glucose: 143 mg/dL — ABNORMAL HIGH (ref <117)

## 2013-09-13 LAB — TROPONIN I
Troponin I: <0.3 ng/mL (ref <0.30)
Troponin I: <0.3 ng/mL (ref <0.30)
Troponin I: <0.3 ng/mL (ref <0.30)

## 2013-09-13 LAB — URINE MICROSCOPIC-ADD ON

## 2013-09-13 LAB — TSH: TSH: 0.664 u[IU]/mL (ref 0.350–4.500)

## 2013-09-13 LAB — PROTIME-INR
INR: 2.73 — ABNORMAL HIGH (ref 0.00–1.49)
Prothrombin Time: 28.9 seconds — ABNORMAL HIGH (ref 11.6–15.2)

## 2013-09-13 MED ORDER — SODIUM CHLORIDE 0.9 % IJ SOLN
10.0000 mL | Freq: Two times a day (BID) | INTRAMUSCULAR | Status: DC
  Administered 2013-09-13: 10 mL

## 2013-09-13 MED ORDER — ACETAMINOPHEN 650 MG RE SUPP: 650.0000 mg | Freq: Four times a day (QID) | RECTAL | Status: DC | PRN

## 2013-09-13 MED ORDER — ATORVASTATIN CALCIUM 10 MG PO TABS
10.0000 mg | ORAL_TABLET | Freq: Every day | ORAL | Status: DC
  Administered 2013-09-13 – 2013-09-14 (×2): 10 mg via ORAL
  Filled 2013-09-13 (×2): qty 1

## 2013-09-13 MED ORDER — DEXTROSE 10 % IV SOLN: INTRAVENOUS | Status: DC

## 2013-09-13 MED ORDER — CLONIDINE HCL 0.2 MG PO TABS
0.2000 mg | ORAL_TABLET | Freq: Two times a day (BID) | ORAL | Status: DC
  Administered 2013-09-13 – 2013-09-14 (×3): 0.2 mg via ORAL
  Filled 2013-09-13 (×4): qty 1

## 2013-09-13 MED ORDER — TRAMADOL HCL 50 MG PO TABS
50.0000 mg | ORAL_TABLET | Freq: Two times a day (BID) | ORAL | Status: DC
  Administered 2013-09-13 – 2013-09-14 (×3): 50 mg via ORAL
  Filled 2013-09-13 (×3): qty 1

## 2013-09-13 MED ORDER — SODIUM CHLORIDE 0.9 % IJ SOLN
3.0000 mL | Freq: Two times a day (BID) | INTRAMUSCULAR | Status: DC
  Administered 2013-09-14: 3 mL via INTRAVENOUS

## 2013-09-13 MED ORDER — ONDANSETRON HCL 4 MG PO TABS: 4.0000 mg | ORAL_TABLET | Freq: Four times a day (QID) | ORAL | Status: DC | PRN

## 2013-09-13 MED ORDER — WARFARIN SODIUM 2 MG PO TABS
2.0000 mg | ORAL_TABLET | Freq: Once | ORAL | Status: AC
  Administered 2013-09-13: 2 mg via ORAL
  Filled 2013-09-13: qty 1

## 2013-09-13 MED ORDER — TRAMADOL HCL 50 MG PO TABS
50.0000 mg | ORAL_TABLET | Freq: Once | ORAL | Status: AC
  Administered 2013-09-13: 50 mg via ORAL
  Filled 2013-09-13: qty 1

## 2013-09-13 MED ORDER — ONDANSETRON HCL 4 MG/2ML IJ SOLN: 4.0000 mg | Freq: Four times a day (QID) | INTRAMUSCULAR | Status: DC | PRN

## 2013-09-13 MED ORDER — DEXTROSE 50 % IV SOLN
1.0000 | Freq: Once | INTRAVENOUS | Status: AC
  Administered 2013-09-13: 50 mL via INTRAVENOUS

## 2013-09-13 MED ORDER — DEXTROSE 10 % IV SOLN
INTRAVENOUS | Status: DC
  Filled 2013-09-13: qty 1000

## 2013-09-13 MED ORDER — LORAZEPAM 0.5 MG PO TABS: 0.5000 mg | ORAL_TABLET | Freq: Four times a day (QID) | ORAL | Status: DC | PRN

## 2013-09-13 MED ORDER — WARFARIN SODIUM 3 MG PO TABS: 3.0000 mg | ORAL_TABLET | Freq: Every day | ORAL | Status: DC

## 2013-09-13 MED ORDER — WARFARIN SODIUM 2 MG PO TABS: 2.0000 mg | ORAL_TABLET | Freq: Every day | ORAL | Status: DC

## 2013-09-13 MED ORDER — VITAMINS A & D EX OINT
TOPICAL_OINTMENT | CUTANEOUS | Status: AC
  Administered 2013-09-13: 1
  Filled 2013-09-13: qty 5

## 2013-09-13 MED ORDER — VERAPAMIL HCL ER 240 MG PO TBCR
240.0000 mg | EXTENDED_RELEASE_TABLET | Freq: Every day | ORAL | Status: DC
  Administered 2013-09-13 – 2013-09-14 (×2): 240 mg via ORAL
  Filled 2013-09-13 (×2): qty 1

## 2013-09-13 MED ORDER — DEXTROSE 50 % IV SOLN
INTRAVENOUS | Status: AC
  Filled 2013-09-13: qty 50

## 2013-09-13 MED ORDER — SODIUM CHLORIDE 0.9 % IJ SOLN
10.0000 mL | INTRAMUSCULAR | Status: DC | PRN
  Administered 2013-09-14: 10 mL

## 2013-09-13 MED ORDER — LEVALBUTEROL HCL 0.63 MG/3ML IN NEBU: 0.6300 mg | INHALATION_SOLUTION | Freq: Four times a day (QID) | RESPIRATORY_TRACT | Status: DC | PRN

## 2013-09-13 MED ORDER — FUROSEMIDE 40 MG PO TABS
40.0000 mg | ORAL_TABLET | Freq: Every day | ORAL | Status: DC
  Administered 2013-09-13 – 2013-09-14 (×2): 40 mg via ORAL
  Filled 2013-09-13 (×2): qty 1

## 2013-09-13 MED ORDER — ACETAMINOPHEN 325 MG PO TABS: 650.0000 mg | ORAL_TABLET | Freq: Four times a day (QID) | ORAL | Status: DC | PRN

## 2013-09-13 MED ORDER — WARFARIN - PHARMACIST DOSING INPATIENT: Freq: Every day | Status: DC

## 2013-09-13 NOTE — Progress Notes (Signed)
Inpatient Diabetes Program Recommendations  AACE/ADA: New Consensus Statement on Inpatient Glycemic Control (2013)  Target Ranges:  Prepandial:   less than 140 mg/dL      Peak postprandial:   less than 180 mg/dL (1-2 hours)      Critically ill patients:  140 - 180 mg/dL   Reason for Visit: Diabetes Consult  Diabetes history: DM2 Outpatient Diabetes medications: glipizide 5 mg bid, metformin 500 mg bid Current orders for Inpatient glycemic control: None HgbA1C - 77.1%  80 year old female admitted with hypoglycemia.  Recommend discontinuing metformin and glipizide at discharge. Would add Novolog sensitive tidwc while inpatient.  Will discuss home glucose monitoring, hypoglycemia s/s and treatment with pt in am.  Thank you. Lorenda Peck, RD, LDN, CDE Inpatient Diabetes Coordinator [phone]

## 2013-09-13 NOTE — H&P (Addendum)
[facility] History and Physical  Sabrina [name] [id number]:[id number] DOB: 1926/10/02 DOA: 09/13/2013  Referring physician:    PCP: [name], MD   Chief Complaint: Hypoglycemia  HPI:  80 year old with a history of DM, HTN, S/P R breast lumpectomy and porta cath. Stage II (T1 N1) invasive ductal carcinoma,grade 3 ER PR positive HER-2/neu positive, last chemotherapy given last Tuesday, presents today with altered mental status/hypoglycemia.[name] She woke up in the middle the night was having a hard time speaking. She was able to get her husband's attention who called EMS. There is no focal weakness. EMS noted her glucose to be very low and gave her D50 which resolved her symptoms. The patient is a diabetic and is currently on metformin and glipizide. Repeat CBG in the ED was 35..she denies a fevers, cough, shortness of breath, abdominal pain, vomiting, or urinary symptoms. Her creatinine has been fluctuating from 1.36-1.8.  [name] She had a recent history of PE/DVT, started on Coumadin. She denies any chest pain any shortness of breath. Patient states that her metformin was discontinued following her discharge in April. Her PCP Dr. Felipa Eth recommended her to restart her metformin. The patient has been taking both metformin and glipizide. She does not check her CBG at home.       Review of Systems: negative for the following  Constitutional: Denies fever, chills, diaphoresis, appetite change and fatigue.  HEENT: Denies photophobia, eye pain, redness, hearing loss, ear pain, congestion, sore throat, rhinorrhea, sneezing, mouth sores, trouble swallowing, neck pain, neck stiffness and tinnitus.  Respiratory: Denies SOB, DOE, cough, chest tightness, and wheezing.  Cardiovascular: Denies chest pain, palpitations and leg swelling.  Gastrointestinal: Denies nausea, vomiting, abdominal pain, diarrhea, constipation, blood in stool and abdominal distention.  Genitourinary: Denies dysuria, urgency,  frequency, hematuria, flank pain and difficulty urinating.  Musculoskeletal: Denies myalgias, back pain, joint swelling, arthralgias and gait problem.  Skin: Denies pallor, rash and wound.  Neurological: Positive for speech difficulty and weakness.  Hematological: Denies adenopathy. Easy bruising, personal or family bleeding history  Psychiatric/Behavioral: Denies suicidal ideation, mood changes, confusion, nervousness, sleep disturbance and agitation       Past Medical History  Diagnosis Date  . Heart murmur     no known problems; states did not know she had murmur until age 89  . Immature cataract   . Arthritis     neck  . Wears partial dentures     lower  . Dental crowns present   . Hypertension     fluctuates, especially when stressed; has been on med. > 20 yr.  . Non-insulin dependent type 2 diabetes mellitus   . High cholesterol   . Chronic kidney disease (CKD), stage III (moderate)     nephrologist, Dr. [name]  . Breast cancer 03/2013    right     Past Surgical History  Procedure Laterality Date  . Tonsillectomy      as a child  . Breast surgery Right 11/1958    right breast biopsy benign  . Dilation and curettage of uterus    . Breast lumpectomy with needle localization Right 03/30/2013    Procedure: BREAST LUMPECTOMY WITH NEEDLE LOCALIZATION;  Surgeon: Rolm Bookbinder, MD;  Location: [facility];  Service: General;  Laterality: Right;  . Axillary lymph node dissection Right 03/30/2013    Procedure: AXILLARY LYMPH NODE DISSECTION;  Surgeon: Rolm Bookbinder, MD;  Location: [facility];  Service: General;  Laterality: Right;  . Re-excision of breast cancer,superior margins Right  04/15/2013    Procedure: RE-EXCISION OF RIGHT BREAST  MARGINS;  Surgeon: Rolm Bookbinder, MD;  Location: [facility];  Service: General;  Laterality: Right;  . Portacath placement N/A 04/15/2013    Procedure: INSERTION PORT-A-CATH;  Surgeon: Rolm Bookbinder, MD;  Location:  [facility];  Service: General;  Laterality: N/A;      Social History:  reports that she has never smoked. She has never used smokeless tobacco. She reports that she drinks alcohol. She reports that she does not use illicit drugs.    No Known Allergies  Family History  Problem Relation Age of Onset  . Pneumonia Mother   . Heart attack Father   . Breast cancer Other 8    niece  . Breast cancer Other 32    niece     Prior to Admission medications   Medication Sig Start Date End Date Taking? Authorizing Provider  anastrozole (ARIMIDEX) 1 MG tablet Take 1 tablet (1 mg total) by mouth daily. 08/10/13  Yes Minette Headland, NP  atorvastatin (LIPITOR) 10 MG tablet Take 10 mg by mouth daily.   Yes Historical Provider, MD  calcium-vitamin D (OSCAL WITH D) 250-125 MG-UNIT per tablet Take 1 tablet by mouth 2 (two) times daily.   Yes Historical Provider, MD  cholecalciferol (VITAMIN D) 1000 UNITS tablet Take 1,000 Units by mouth daily.   Yes Historical Provider, MD  cloNIDine (CATAPRES) 0.2 MG tablet Take 0.2 mg by mouth 2 (two) times daily.   Yes Historical Provider, MD  dexamethasone (DECADRON) 4 MG tablet Take 4 mg by mouth 2 (two) times daily. After chemo for 3 days 04/15/13  Yes Historical Provider, MD  furosemide (LASIX) 40 MG tablet Take 1 tablet (40 mg total) by mouth daily. Take extra tab as needed for swelling 05/28/13  Yes Jolaine Artist, MD  glipiZIDE (GLUCOTROL) 5 MG tablet Take 1 tablet (5 mg total) by mouth 2 (two) times daily before a meal. 06/19/13  Yes Theodis Blaze, MD  HYDROcodone-acetaminophen (NORCO/VICODIN) 5-325 MG per tablet Take 1-2 tablets by mouth every 4 (four) hours as needed for moderate pain. 06/19/13  Yes Theodis Blaze, MD  IRON PO Take 65 mg by mouth daily.   Yes Historical Provider, MD  lidocaine-prilocaine (EMLA) cream Apply 1 - 2 hours to port -a-cath before being accessed. 05/03/13  Yes Deatra Robinson, MD  LORazepam (ATIVAN) 0.5 MG tablet Take  1 tablet (0.5 mg total) by mouth every 6 (six) hours as needed (Nausea or vomiting). 06/19/13  Yes Theodis Blaze, MD  metFORMIN (GLUCOPHAGE) 500 MG tablet Take 500 mg by mouth 2 (two) times daily with a meal.   Yes Historical Provider, MD  nystatin ointment (MYCOSTATIN) Apply 1 application topically 2 (two) times daily as needed (skin).  05/18/13  Yes Historical Provider, MD  nystatin-triamcinolone (MYCOLOG II) cream Apply 1 application topically 2 (two) times daily.  05/27/13  Yes Historical Provider, MD  ondansetron (ZOFRAN) 4 MG tablet Take 1 tablet (4 mg total) by mouth every 8 (eight) hours as needed for nausea or vomiting. 06/19/13  Yes Theodis Blaze, MD  potassium chloride 20 MEQ TBCR Take 20 mEq by mouth daily. 04/22/13  Yes Jolaine Artist, MD  prochlorperazine (COMPAZINE) 10 MG tablet Take 10 mg by mouth every 6 (six) hours as needed for nausea or vomiting.  04/15/13  Yes Historical Provider, MD  traMADol (ULTRAM) 50 MG tablet Take 1 tablet (50 mg total) by mouth  2 (two) times daily. 06/19/13  Yes Theodis Blaze, MD  verapamil (VERELAN PM) 240 MG 24 hr capsule Take 240 mg by mouth daily.    Yes Historical Provider, MD  warfarin (COUMADIN) 1 MG tablet Take 2 mg by mouth daily. Take 45m tablet for 2 days then take two 147mtablets (6m11mthe third day   Yes Historical Provider, MD  warfarin (COUMADIN) 3 MG tablet Take 3 mg by mouth daily. Take 3mg71mblets for 2 days then take two 1mg 54mlets (6mg) 87m the third day   Yes Historical Provider, MD     Physical Exam: Filed Vitals:   09/13/13 0530 09/13/13 0605 09/13/13 0626 09/13/13 1232  BP:  182/69 182/69 161/76  Pulse: 80 83 89 96  Temp:    98.2 F (36.8 C)  TempSrc:    Oral  Resp:   16 19  Height:    4' 10.5" (1.486 m)  Weight:    75.3 kg (166 lb 0.1 oz)  SpO2: 96% 98% 98% 98%     Constitutional: Vital signs reviewed. Patient is a well-developed and well-nourished in no acute distress and cooperative with exam. Alert and oriented x3.   Head: Normocephalic and atraumatic  Ear: TM normal bilaterally  Mouth: no erythema or exudates, MMM  Eyes: PERRL, EOMI, conjunctivae normal, No scleral icterus.  Neck: Supple, Trachea midline normal ROM, No JVD, mass, thyromegaly, or carotid bruit present.  Cardiovascular: RRR, S1 normal, S2 normal, no MRG, pulses symmetric and intact bilaterally  Pulmonary/Chest: CTAB, no wheezes, rales, or rhonchi  Abdominal: Soft. Non-tender, non-distended, bowel sounds are normal, no masses, organomegaly, or guarding present.  GU: no CVA tenderness Musculoskeletal: No joint deformities, erythema, or stiffness, ROM full and no nontender Ext: no edema and no cyanosis, pulses palpable bilaterally (DP and PT)  Hematology: no cervical, inginal, or axillary adenopathy.  Neurological: A&O x3, Strenght is normal and symmetric bilaterally, cranial nerve II-XII are grossly intact, no focal motor deficit, sensory intact to light touch bilaterally.  Skin: Warm, dry and intact. No rash, cyanosis, or clubbing.  Psychiatric: Normal mood and affect. speech and behavior is normal. Judgment and thought content normal. Cognition and memory are normal.       Labs on Admission:    Basic Metabolic Panel:  Recent Labs Lab 09/07/13 1318 09/13/13 0553  NA 141 144  K 4.5 4.0  CL  --  103  CO2 28 28  GLUCOSE 208* 56*  BUN 45.1* 49*  CREATININE 1.8* 1.41*  CALCIUM 9.9 9.4   Liver Function Tests:  Recent Labs Lab 09/07/13 1318 09/13/13 1106  AST 13 17  ALT 14 16  ALKPHOS 81 74  BILITOT 0.55 0.6  PROT 6.6 6.5  ALBUMIN 3.6 3.6   No results found for this basename: LIPASE, AMYLASE,  in the last 168 hours No results found for this basename: AMMONIA,  in the last 168 hours CBC:  Recent Labs Lab 09/07/13 1318 09/13/13 0553  WBC 7.0 10.8*  NEUTROABS 4.3 8.7*  HGB 10.5* 11.1*  HCT 32.4* 33.2*  MCV 90.6 89.2  PLT 244 283   Cardiac Enzymes:  Recent Labs Lab 09/13/13 1106  TROPONINI <0.30     BNP (last 3 results)  Recent Labs  06/07/13 1215  PROBNP 557.1*      CBG:  Recent Labs Lab 09/13/13 0447 09/13/13 0624 09/13/13 0656 09/13/13 0732  GLUCAP 93 35* 142* 104*    Radiological Exams on Admission: No results found.  EKG:  Independently reviewed.   Assessment/Plan Active Problems:   Hypoglycemia  Altered mental status secondary to hypoglycemic event Discontinue metformin/glipizide Likely hypoglycemic because of changing renal function Baseline creatinine of about 1.3-1.5 Recommended that metformin be discontinued permanently Diabetes coordinator consultation to teach CBG monitoring at home Accu-Cheks every 2 hours D. 10 if CBG less than 70  Breast cancer Followed by Dr. [name]   Hypertension Continue verapamil/clonidine  Recent pulmonary embolism Coumadin per pharmacy  LE edema - followed by Benay Spice He recommends lasix as needed to keep fluid down Most recent 2-D echo 08/11/13 EF of 60-65%, Mild aortic stenosis     Code Status:   full Family Communication: bedside Disposition Plan: admit   Time spent: 70 mins   [facility] Pager [phone]  If 7PM-7AM, please contact night-coverage [email or web address] Password [email or web address] 09/13/2013, 2:13 PM

## 2013-09-13 NOTE — ED Notes (Addendum)
Patient arrives via EMS due to c/o hypoglycemia Patient's husband found patient at 0300 with AMS On scene, patient CBG 31 Patient given D50 by EMS---last CBG 203 Patient arrives to ED alert and oriented x 4

## 2013-09-13 NOTE — ED Notes (Signed)
CBG 149. 

## 2013-09-13 NOTE — Care Management Note (Addendum)
    Page 1 of 1   09/14/2013     3:15:30 PM CARE MANAGEMENT NOTE 09/14/2013  Patient:  Sabrina [name], Sabrina [name]   Account Number:  [id number]  Date Initiated:  09/13/2013  Documentation initiated by:  Dessa Phi  Subjective/Objective Assessment:   80 Y/O F ADMITTED W/HYPOGLYCEMIA.HX:DM,BREAST CA     Action/Plan:   FROM  HOME W/SPOUSE.   Anticipated DC Date:  09/14/2013   Anticipated DC Plan:  [place]  CM consult      Choice offered to / List presented to:  C-1 Patient        [place] arranged  HH-1 RN  [place].   Status of service:  Completed, signed off Medicare Important Message given?   (If response is "NO", the following Medicare IM given date fields will be blank) Date Medicare IM given:   Medicare IM given by:   Date Additional Medicare IM given:   Additional Medicare IM given by:    Discharge Disposition:  [place]  Per UR Regulation:  Reviewed for med. necessity/level of care/duration of stay  If discussed at Long Length of Stay Meetings, dates discussed:    Comments:  09/14/13 Canon Gola RN,BSN NCM [place].PATIENT TO TAKE SCRIPT FOR GLUCOMETER TO PHARMACY-PATIENT VOICED UNDERSTANDING.  09/13/13 Onda Kattner RN,BSN NCM [id number] PATIENT HAS USED [facility] IN PAST WILL USE AGAIN.TC [facility] REP KRISTEN FOR REFERRAL.AWAIT HHRN ORDER. RECOMMEND HHRN-DISEASE MGMNT.FOLLOW FOR D/C NEEDS.

## 2013-09-13 NOTE — ED Notes (Signed)
[place] given, per MD

## 2013-09-13 NOTE — ED Provider Notes (Signed)
CSN: [id number]     Arrival date & time 09/13/13  0422 History   First MD Initiated Contact with Patient 09/13/13 [phone]     Chief Complaint  Patient presents with  . Hypoglycemia     (Consider location/radiation/quality/duration/timing/severity/associated sxs/prior Treatment) HPI 80 year old female presents with hypoglycemia. This started about 2 hours prior to arrival. She woke up in the middle the night was having a hard time speaking. She was able to get her husband's attention who called EMS. There is no focal weakness. EMS noted her glucose to be very low and gave her D50 which resolved her symptoms. The patient is a diabetic and is currently on metformin and glipizide. The glipizide is new over the last couple months. The patient states she has overall been feeling relatively weak as she's been getting chemotherapy and radiation this past week for breast cancer. However she denies a fevers, cough, shortness of breath, abdominal pain, vomiting, or urinary symptoms. Her glucose trended down in the ER prior to me seeing the patient and she was given an amp of D50 for glucose in the 30s and given a sandwich. Patient feels asymptomatic at this time.  Past Medical History  Diagnosis Date  . Heart murmur     no known problems; states did not know she had murmur until age 63  . Immature cataract   . Arthritis     neck  . Wears partial dentures     lower  . Dental crowns present   . Hypertension     fluctuates, especially when stressed; has been on med. > 20 yr.  . Non-insulin dependent type 2 diabetes mellitus   . High cholesterol   . Chronic kidney disease (CKD), stage III (moderate)     nephrologist, Dr. [name]  . Breast cancer 03/2013    right   Past Surgical History  Procedure Laterality Date  . Tonsillectomy      as a child  . Breast surgery Right 11/1958    right breast biopsy benign  . Dilation and curettage of uterus    . Breast lumpectomy with needle  localization Right 03/30/2013    Procedure: BREAST LUMPECTOMY WITH NEEDLE LOCALIZATION;  Surgeon: Rolm Bookbinder, MD;  Location: [facility];  Service: General;  Laterality: Right;  . Axillary lymph node dissection Right 03/30/2013    Procedure: AXILLARY LYMPH NODE DISSECTION;  Surgeon: Rolm Bookbinder, MD;  Location: [facility];  Service: General;  Laterality: Right;  . Re-excision of breast cancer,superior margins Right 04/15/2013    Procedure: RE-EXCISION OF RIGHT BREAST  MARGINS;  Surgeon: Rolm Bookbinder, MD;  Location: [facility];  Service: General;  Laterality: Right;  . Portacath placement N/A 04/15/2013    Procedure: INSERTION PORT-A-CATH;  Surgeon: Rolm Bookbinder, MD;  Location: [facility];  Service: General;  Laterality: N/A;   Family History  Problem Relation Age of Onset  . Pneumonia Mother   . Heart attack Father   . Breast cancer Other 61    niece  . Breast cancer Other 56    niece   History  Substance Use Topics  . Smoking status: Never Smoker   . Smokeless tobacco: Never Used     Comment: only smoked 2 packs cigarettes total  . Alcohol Use: 0.0 oz/week     Comment: 2-3 glasses wine/week   OB History   Grav Para Term Preterm Abortions TAB SAB Ect Mult Living  Obstetric Comments   Menarche ae 14 No children Married 46 years     Review of Systems  Constitutional: Negative for fever.  Cardiovascular: Negative for chest pain.  Gastrointestinal: Negative for nausea, vomiting and abdominal pain.  Genitourinary: Negative for dysuria.  Neurological: Positive for speech difficulty and weakness.  All other systems reviewed and are negative.     Allergies  Review of patient's allergies indicates no known allergies.  Home Medications   Prior to Admission medications   Medication Sig Start Date End Date Taking? Authorizing Provider  anastrozole (ARIMIDEX) 1 MG tablet Take 1 tablet (1 mg total) by mouth daily. 08/10/13  Yes  Minette Headland, NP  atorvastatin (LIPITOR) 10 MG tablet Take 10 mg by mouth daily.   Yes Historical Provider, MD  calcium-vitamin D (OSCAL WITH D) 250-125 MG-UNIT per tablet Take 1 tablet by mouth 2 (two) times daily.   Yes Historical Provider, MD  cholecalciferol (VITAMIN D) 1000 UNITS tablet Take 1,000 Units by mouth daily.   Yes Historical Provider, MD  cloNIDine (CATAPRES) 0.2 MG tablet Take 0.2 mg by mouth 2 (two) times daily.   Yes Historical Provider, MD  dexamethasone (DECADRON) 4 MG tablet Take 4 mg by mouth 2 (two) times daily. After chemo for 3 days 04/15/13  Yes Historical Provider, MD  furosemide (LASIX) 40 MG tablet Take 1 tablet (40 mg total) by mouth daily. Take extra tab as needed for swelling 05/28/13  Yes Jolaine Artist, MD  glipiZIDE (GLUCOTROL) 5 MG tablet Take 1 tablet (5 mg total) by mouth 2 (two) times daily before a meal. 06/19/13  Yes Theodis Blaze, MD  IRON PO Take 65 mg by mouth daily.   Yes Historical Provider, MD  lidocaine-prilocaine (EMLA) cream Apply 1 - 2 hours to port -a-cath before being accessed. 05/03/13  Yes Deatra Robinson, MD  LORazepam (ATIVAN) 0.5 MG tablet Take 1 tablet (0.5 mg total) by mouth every 6 (six) hours as needed (Nausea or vomiting). 06/19/13  Yes Theodis Blaze, MD  nystatin ointment (MYCOSTATIN) Apply 1 application topically 2 (two) times daily as needed (skin).  05/18/13  Yes Historical Provider, MD  nystatin-triamcinolone (MYCOLOG II) cream Apply 1 application topically 2 (two) times daily.  05/27/13  Yes Historical Provider, MD  ondansetron (ZOFRAN) 4 MG tablet Take 1 tablet (4 mg total) by mouth every 8 (eight) hours as needed for nausea or vomiting. 06/19/13  Yes Theodis Blaze, MD  potassium chloride 20 MEQ TBCR Take 20 mEq by mouth daily. 04/22/13  Yes Jolaine Artist, MD  prochlorperazine (COMPAZINE) 10 MG tablet Take 10 mg by mouth every 6 (six) hours as needed for nausea or vomiting.  04/15/13  Yes Historical Provider, MD  traMADol  (ULTRAM) 50 MG tablet Take 1 tablet (50 mg total) by mouth 2 (two) times daily. 06/19/13  Yes Theodis Blaze, MD  verapamil (VERELAN PM) 240 MG 24 hr capsule Take 240 mg by mouth daily.    Yes Historical Provider, MD  warfarin (COUMADIN) 1 MG tablet Take 2 mg by mouth daily. Take 3mg  tablet for 2 days then take two 1mg  tablets (2mg ) the third day   Yes Historical Provider, MD  warfarin (COUMADIN) 3 MG tablet Take 3 mg by mouth daily. Take 3mg  tablets for 2 days then take two 1mg  tablets (2mg )  on the third day   Yes Historical Provider, MD  HYDROcodone-acetaminophen (NORCO/VICODIN) 5-325 MG per tablet Take 1-2 tablets by mouth every 4 (  four) hours as needed for moderate pain. 06/19/13   Theodis Blaze, MD   BP 182/69  Pulse 89  Temp(Src) 97.5 F (36.4 C) (Oral)  Resp 16  SpO2 98% Physical Exam  Nursing note and vitals reviewed. Constitutional: She is oriented to person, place, and time. She appears well-developed and well-nourished.  HENT:  Head: Normocephalic and atraumatic.  Right Ear: External ear normal.  Left Ear: External ear normal.  Nose: Nose normal.  Eyes: EOM are normal. Pupils are equal, round, and reactive to light. Right eye exhibits no discharge. Left eye exhibits no discharge.  Cardiovascular: Normal rate, regular rhythm and normal heart sounds.   Pulmonary/Chest: Effort normal and breath sounds normal.  Abdominal: Soft. She exhibits no distension. There is no tenderness.  Neurological: She is alert and oriented to person, place, and time.  CN 2-12 grossly intact. 5/5 strength in all 4 extremities  Skin: Skin is warm and dry.  Psychiatric: Her speech is normal.    ED Course  Procedures (including critical care time) Labs Review Labs Reviewed  URINALYSIS, ROUTINE W REFLEX MICROSCOPIC - Abnormal; Notable for the following:    Glucose, UA 100 (*)    Leukocytes, UA MODERATE (*)    All other components within normal limits  BASIC METABOLIC PANEL - Abnormal; Notable for  the following:    Glucose, Bld 56 (*)    BUN 49 (*)    Creatinine, Ser 1.41 (*)    GFR calc non Af Amer 33 (*)    GFR calc Af Amer 38 (*)    All other components within normal limits  CBC WITH DIFFERENTIAL - Abnormal; Notable for the following:    WBC 10.8 (*)    RBC 3.72 (*)    Hemoglobin 11.1 (*)    HCT 33.2 (*)    Neutrophils Relative % 81 (*)    Neutro Abs 8.7 (*)    All other components within normal limits  URINE MICROSCOPIC-ADD ON - Abnormal; Notable for the following:    Bacteria, UA FEW (*)    All other components within normal limits  CBG MONITORING, ED  CBG MONITORING, ED  CBG MONITORING, ED  CBG MONITORING, ED    Imaging Review No results found.   EKG Interpretation None      MDM   Final diagnoses:  Hypoglycemia    Patient has been having downtrending glucoses despite being fed multiple times and having D50 by EMS and in the ED. She's not a glucometer home meds I feel she is safe for discharge given that her glucose is at 67. No obvious etiology for her recurrent hypoglycemia. Discussion with patient she should is issued for an observation admission and placed on D10 infusion.    Ephraim Hamburger, MD 09/13/13 1055

## 2013-09-13 NOTE — Progress Notes (Signed)
ANTICOAGULATION CONSULT NOTE - Initial Consult  Pharmacy Consult for Warfarin Indication: history of PE and DVT  No Known Allergies  Patient Measurements: Height: 4' 10.5" (148.6 cm) Weight: 166 lb 0.1 oz (75.3 kg) IBW/kg (Calculated) : 42.05  Vital Signs: Temp: 98.2 F (36.8 C) (07/13 1232) Temp src: Oral (07/13 1232) BP: 161/76 mmHg (07/13 1232) Pulse Rate: 96 (07/13 1232)  Labs:  Recent Labs  09/13/13 0553 09/13/13 1106 09/13/13 1318  HGB 11.1*  --   --   HCT 33.2*  --   --   PLT 283  --   --   LABPROT  --   --  28.9*  INR  --   --  2.73*  CREATININE 1.41*  --   --   TROPONINI  --  <0.30  --     Estimated Creatinine Clearance: 26 ml/min (by C-G formula based on Cr of 1.41).   Medical History: Past Medical History  Diagnosis Date  . Heart murmur     no known problems; states did not know she had murmur until age 53  . Immature cataract   . Arthritis     neck  . Wears partial dentures     lower  . Dental crowns present   . Hypertension     fluctuates, especially when stressed; has been on med. > 20 yr.  . Non-insulin dependent type 2 diabetes mellitus   . High cholesterol   . Chronic kidney disease (CKD), stage III (moderate)     nephrologist, Dr. [name]  . Breast cancer 03/2013    right     Assessment: 67 yoF with breast cancer (on Herceptin, cycle 1 completed 7/7) and chronic warfarin for PE/DVT diagnosed in April 2015, admitted for observation with hypoglycemia.  Pharmacy consulted to manage warfarin inpatient.   PTA warfarin dose reported as 3mg  for 2 days, then 2mg  every 3rd day.  Last dose 3 mg on 7/12.  Due for 2 mg dose today per home schedule.  Today's INR therapeutic at 2.73  CBC ok  Goal of Therapy:  INR 2-3   Plan:  1.  Warfarin 2 mg po once today. 2.  Daily INR.  Hershal Coria 09/13/2013,2:04 PM

## 2013-09-13 NOTE — ED Notes (Signed)
Pt given a cup of applesauce and orange juice.

## 2013-09-13 NOTE — ED Notes (Signed)
MD at bedside. 

## 2013-09-13 NOTE — ED Notes (Signed)
Pt attempted to urinate and could not.

## 2013-09-13 NOTE — ED Notes (Signed)
CBG 35 Dr. Florina Ou aware Patient is alert and oriented x 4--Neuro status WNL Patient watching TV and in NAD D50 given, see MAR

## 2013-09-14 ENCOUNTER — Ambulatory Visit
Admission: RE | Admit: 2013-09-14 | Discharge: 2013-09-14 | Disposition: A | Discharge status: Home / Self Care | Payer: Medicare Other | Source: Ambulatory Visit | Attending: Radiation Oncology | Admitting: Radiation Oncology

## 2013-09-14 ENCOUNTER — Encounter: Payer: Medicare Other | Admitting: Radiation Oncology

## 2013-09-14 DIAGNOSIS — E1129 Type 2 diabetes mellitus with other diabetic kidney complication: Secondary | ICD-10-CM

## 2013-09-14 DIAGNOSIS — C50419 Malignant neoplasm of upper-outer quadrant of unspecified female breast: Secondary | ICD-10-CM

## 2013-09-14 DIAGNOSIS — C50411 Malignant neoplasm of upper-outer quadrant of right female breast: Secondary | ICD-10-CM

## 2013-09-14 LAB — COMPREHENSIVE METABOLIC PANEL
ALT: 13 U/L (ref 0–35)
AST: 10 U/L (ref 0–37)
Albumin: 3.3 g/dL — ABNORMAL LOW (ref 3.5–5.2)
Alkaline Phosphatase: 72 U/L (ref 39–117)
Anion gap: 10 (ref 5–15)
BUN: 41 mg/dL — ABNORMAL HIGH (ref 6–23)
CO2: 30 mEq/L (ref 19–32)
Calcium: 9.2 mg/dL (ref 8.4–10.5)
Chloride: 102 mEq/L (ref 96–112)
Creatinine, Ser: 1.66 mg/dL — ABNORMAL HIGH (ref 0.50–1.10)
GFR calc Af Amer: 32 mL/min — ABNORMAL LOW (ref >90)
GFR calc non Af Amer: 27 mL/min — ABNORMAL LOW (ref >90)
Glucose, Bld: 224 mg/dL — ABNORMAL HIGH (ref 70–99)
Potassium: 4.5 mEq/L (ref 3.7–5.3)
Sodium: 142 mEq/L (ref 137–147)
Total Bilirubin: 0.7 mg/dL (ref 0.3–1.2)
Total Protein: 5.9 g/dL — ABNORMAL LOW (ref 6.0–8.3)

## 2013-09-14 LAB — GLUCOSE, CAPILLARY
Glucose-Capillary: 181 mg/dL — ABNORMAL HIGH (ref 70–99)
Glucose-Capillary: 198 mg/dL — ABNORMAL HIGH (ref 70–99)
Glucose-Capillary: 231 mg/dL — ABNORMAL HIGH (ref 70–99)
Glucose-Capillary: 255 mg/dL — ABNORMAL HIGH (ref 70–99)
Glucose-Capillary: 277 mg/dL — ABNORMAL HIGH (ref 70–99)

## 2013-09-14 LAB — CBC
HCT: 31.2 % — ABNORMAL LOW (ref 36.0–46.0)
Hemoglobin: 9.9 g/dL — ABNORMAL LOW (ref 12.0–15.0)
MCH: 29.2 pg (ref 26.0–34.0)
MCHC: 31.7 g/dL (ref 30.0–36.0)
MCV: 92 fL (ref 78.0–100.0)
Platelets: 225 10*3/uL (ref 150–400)
RBC: 3.39 MIL/uL — ABNORMAL LOW (ref 3.87–5.11)
RDW: 14.6 % (ref 11.5–15.5)
WBC: 7 10*3/uL (ref 4.0–10.5)

## 2013-09-14 LAB — PROTIME-INR
INR: 2.79 — ABNORMAL HIGH (ref 0.00–1.49)
Prothrombin Time: 29.4 seconds — ABNORMAL HIGH (ref 11.6–15.2)

## 2013-09-14 LAB — URINE CULTURE: Colony Count: >=100000

## 2013-09-14 MED ORDER — GLUCOSE BLOOD VI STRP: ORAL_STRIP | Status: DC

## 2013-09-14 MED ORDER — GLIPIZIDE 2.5 MG HALF TABLET
2.5000 mg | ORAL_TABLET | Freq: Two times a day (BID) | ORAL | Status: DC
  Administered 2013-09-14: 2.5 mg via ORAL
  Filled 2013-09-14 (×3): qty 1

## 2013-09-14 MED ORDER — GLIPIZIDE 2.5 MG HALF TABLET: 2.5000 mg | ORAL_TABLET | Freq: Two times a day (BID) | ORAL | Status: DC

## 2013-09-14 MED ORDER — FREESTYLE SYSTEM KIT: 1.0000 | PACK | Status: DC | PRN

## 2013-09-14 MED ORDER — HEPARIN SOD (PORK) LOCK FLUSH 100 UNIT/ML IV SOLN
500.0000 [IU] | INTRAVENOUS | Status: AC | PRN
  Administered 2013-09-14: 500 [IU]

## 2013-09-14 NOTE — Progress Notes (Signed)
Inpatient Diabetes Program Recommendations  AACE/ADA: New Consensus Statement on Inpatient Glycemic Control (2013)  Target Ranges:  Prepandial:   less than 140 mg/dL      Peak postprandial:   less than 180 mg/dL (1-2 hours)      Critically ill patients:  140 - 180 mg/dL   Discussed home glucose monitoring with pt. Recommended checking blood sugar once/day or when feeling "different". Discussed keeping logbook and f/u with PCP for management of blood sugars. To purchase meter at discharge. RN to have pt test blood sugar with hospital meter prior to discharge. Answered questions.  Thank you. Lorenda Peck, RD, LDN, CDE Inpatient Diabetes Coordinator [phone]

## 2013-09-14 NOTE — Progress Notes (Signed)
Patient for weekly assessment of radiation to right breast.No skin changes.Admitted for hypoglycemia.

## 2013-09-14 NOTE — Progress Notes (Signed)
Weekly Management Note Current Dose:  12.6 Gy  Projected Dose: 60.4 Gy   Narrative:  The patient presents for routine under treatment assessment.  CBCT/MVCT images/Port film x-rays were reviewed.  The chart was checked. Hospitalized for hypoglycemia. Feeling better now but cold. No skin changes.   Physical Findings: In a wheelchair. Shivering.   Impression:  The patient is tolerating radiation.  Plan:  Continue treatment as planned.  Medical management per inpt team. Continue radiaplex.

## 2013-09-14 NOTE — Discharge Instructions (Signed)
Please follow up with primary care doctor in 1 week  °

## 2013-09-14 NOTE — Evaluation (Signed)
Physical Therapy Evaluation Patient Details Name: Sabrina [name] MRN: [id number] DOB: 1926/11/13 Today's Date: 09/14/2013   History of Present Illness  80 yo female admitted with hypoglycemia/AMS. hx of DM, breast cancer, HTN, PE, DVT  Clinical Impression  On eval, pt required Min guard assist for mobility-able to ambulate ~215 feet with walker. Demonstrates general weakness and impaired gait and balance. Pt normally uses a cane for ambulation but required walker on today.     Follow Up Recommendations Home health PT;Supervision/Assistance - 24 hour    Equipment Recommendations  None recommended by PT    Recommendations for Other Services       Precautions / Restrictions Precautions Precautions: Fall Restrictions Weight Bearing Restrictions: No      Mobility  Bed Mobility Overal bed mobility: Needs Assistance Bed Mobility: Supine to Sit     Supine to sit: Min guard     General bed mobility comments: close guard for safety. Increased time.   Transfers Overall transfer level: Needs assistance Equipment used: Rolling walker (2 wheeled) Transfers: Sit to/from Stand Sit to Stand: From elevated surface;Min guard         General transfer comment: close guard for safety  Ambulation/Gait Ambulation/Gait assistance: Min guard Ambulation Distance (Feet): 215 Feet Assistive device: Rolling walker (2 wheeled) Gait Pattern/deviations: Step-through pattern;Decreased stride length     General Gait Details: close guard for safety. Used RW for improved stability (pt normally uses cane)  Financial trader [employer] (Stroke Patients Only)       Balance Overall balance assessment: Needs assistance         Standing balance support: Bilateral upper extremity supported;During functional activity Standing balance-Leahy Scale: Poor                               Pertinent Vitals/Pain Chronic back pain 5/10.     Home  Living Family/patient expects to be discharged to:: Private residence Living Arrangements: Spouse/significant other Available Help at Discharge: Family;Available 24 hours/day Type of Home: House Home Access: Stairs to enter Entrance Stairs-Rails: Psychiatric nurse of Steps: 2 Home Layout: One level        Prior Function Level of Independence: Independent with assistive device(s)         Comments: pt ambulated with SPC in community      Hand Dominance        Extremity/Trunk Assessment   Upper Extremity Assessment: Overall WFL for tasks assessed           Lower Extremity Assessment: Generalized weakness      Cervical / Trunk Assessment: Kyphotic  Communication      Cognition Arousal/Alertness: Awake/alert Behavior During Therapy: WFL for tasks assessed/performed Overall Cognitive Status: Within Functional Limits for tasks assessed                      General Comments      Exercises        Assessment/Plan    PT Assessment All further PT needs can be met in the next venue of care  PT Diagnosis Difficulty walking;Generalized weakness   PT Problem List Pain;Decreased mobility;Decreased activity tolerance;Obesity;Decreased strength  PT Treatment Interventions     PT Goals (Current goals can be found in the Care Plan section) Acute Rehab PT Goals Patient Stated Goal: home today PT Goal Formulation: No goals  set, d/c therapy    Frequency     Barriers to discharge        Co-evaluation               End of Session Equipment Utilized During Treatment: Gait belt Activity Tolerance: Patient limited by pain Patient left: in chair;with call bell/phone within reach      Functional Assessment Tool Used: clinical judgement Functional Limitation: Mobility: Walking and moving around Mobility: Walking and Moving Around Current Status ([id number]): At least 1 percent but less than 20 percent impaired, limited or restricted Mobility:  Walking and Moving Around Goal Status [phone]): At least 1 percent but less than 20 percent impaired, limited or restricted Mobility: Walking and Moving Around Discharge Status [phone]): At least 1 percent but less than 20 percent impaired, limited or restricted    Time: 1051-1111 PT Time Calculation (min): 20 min   Charges:   PT Evaluation $Initial PT Evaluation Tier I: 1 Procedure PT Treatments $Gait Training: 8-22 mins   PT G Codes:   Functional Assessment Tool Used: clinical judgement Functional Limitation: Mobility: Walking and moving around    [employer], MPT Pager: [phone]

## 2013-09-14 NOTE — Discharge Summary (Addendum)
Physician Discharge Summary  Sabrina [name] OFB:[id number] DOB: 30-May-1926 DOA: 09/13/2013  PCP: [name], MD  Admit date: 09/13/2013 Discharge date: 09/14/2013  Time spent: >35 minutes  Recommendations for Outpatient Follow-up:  [place] F/u with PCP in 1 week Discharge Diagnoses:  Active Problems:   Hypoglycemia   Discharge Condition: stable   Diet recommendation: low sodium, DM  Filed Weights   09/13/13 1232  Weight: 75.3 kg (166 lb 0.1 oz)    History of present illness:  80 year old with a history of DM, HTN, S/P R breast lumpectomy and porta cath. Stage II (T1 N1) invasive ductal carcinoma,grade 3 ER PR positive HER-2/neu positive, last chemotherapy given last Tuesday, presents today with altered mental status/hypoglycemia.[name] She woke up in the middle the night was having a hard time speaking. She was able to get her husband's attention who called EMS. There is no focal weakness. EMS noted her glucose to be very low and gave her D50 which resolved her symptoms. The patient is a diabetic and is currently on metformin and glipizide. Repeat CBG in the ED was 35..she denies a fevers, cough, shortness of breath, abdominal pain, vomiting, or urinary symptoms. Her creatinine has been fluctuating from 1.36-1.8.  [name] She had a recent history of PE/DVT, started on Coumadin. She denies any chest pain any shortness of breath. Patient states that her metformin was discontinued following her discharge in April. Her PCP Dr. Felipa Eth recommended her to restart her metformin. The patient has been taking both metformin and glipizide. She does not check her CBG at home.   Hospital Course:  Active Problems:  Hypoglycemia   1. Altered mental status secondary to hypoglycemic event  -resolved;   2. DM with hypoglycemia likely worse with renal insufficiency  hypoglycemia resolved; Discontinue metformin due progressive renal insufficiency  -Diabetes coordinator consultation to teach CBG monitoring  at home; [facility] upon d/c  -f/u in 1 week with PCP to adjust medications as needed  3. Breast cancer Followed by Dr. [name]  4. Hypertension Continue verapamil/clonidine  5. Recent pulmonary embolism Coumadin; cont home regimen, f/u INR as scheduled with PCP 6. LE edema - followed by Benay Spice  -He recommends lasix as needed to keep fluid down  -Most recent 2-D echo 08/11/13 EF of 60-65%, Mild aortic stenosis  -cont outpatient follow up  7. UA mild+leuckocyte, No nitrates, afebrile; no leukocytosis; Pt denies any dysuria;  -no antibiotics; recommended to f/u with PCP in week     Procedures:  none (i.e. Studies not automatically included, echos, thoracentesis, etc; not x-rays)  Consultations:  none  Discharge Exam: Filed Vitals:   09/14/13 0429  BP: 142/57  Pulse: 56  Temp: 97.4 F (36.3 C)  Resp: 18    General: alert Cardiovascular: s1,s2 rrr Respiratory: CTA BL  Discharge Instructions  Discharge Instructions   Diet - low sodium heart healthy    Complete by:  As directed      Discharge instructions    Complete by:  As directed   Please follow up with primary care doctor in 1 week     For home use only DME Glucometer    Complete by:  As directed      Increase activity slowly    Complete by:  As directed   Carb modified            Medication List    STOP taking these medications       metFORMIN 500 MG tablet  Commonly known as:  GLUCOPHAGE      TAKE these medications       anastrozole 1 MG tablet  Commonly known as:  ARIMIDEX  Take 1 tablet (1 mg total) by mouth daily.     atorvastatin 10 MG tablet  Commonly known as:  LIPITOR  Take 10 mg by mouth daily.     calcium-vitamin D 250-125 MG-UNIT per tablet  Commonly known as:  OSCAL WITH D  Take 1 tablet by mouth 2 (two) times daily.     cholecalciferol 1000 UNITS tablet  Commonly known as:  VITAMIN D  Take 1,000 Units by mouth daily.     cloNIDine 0.2 MG tablet  Commonly known as:   CATAPRES  Take 0.2 mg by mouth 2 (two) times daily.     dexamethasone 4 MG tablet  Commonly known as:  DECADRON  Take 4 mg by mouth 2 (two) times daily. After chemo for 3 days     furosemide 40 MG tablet  Commonly known as:  LASIX  Take 1 tablet (40 mg total) by mouth daily. Take extra tab as needed for swelling     glipiZIDE 2.5 mg Tabs tablet  Commonly known as:  GLUCOTROL  Take 0.5 tablets (2.5 mg total) by mouth 2 (two) times daily before a meal.     glucose blood test strip  Use as instructed     glucose monitoring kit monitoring kit  1 each by Does not apply route as needed for other.     HYDROcodone-acetaminophen 5-325 MG per tablet  Commonly known as:  NORCO/VICODIN  Take 1-2 tablets by mouth every 4 (four) hours as needed for moderate pain.     IRON PO  Take 65 mg by mouth daily.     lidocaine-prilocaine cream  Commonly known as:  EMLA  Apply 1 - 2 hours to port -a-cath before being accessed.     LORazepam 0.5 MG tablet  Commonly known as:  ATIVAN  Take 1 tablet (0.5 mg total) by mouth every 6 (six) hours as needed (Nausea or vomiting).     nystatin ointment  Commonly known as:  MYCOSTATIN  Apply 1 application topically 2 (two) times daily as needed (skin).     nystatin-triamcinolone cream  Commonly known as:  MYCOLOG II  Apply 1 application topically 2 (two) times daily.     ondansetron 4 MG tablet  Commonly known as:  ZOFRAN  Take 1 tablet (4 mg total) by mouth every 8 (eight) hours as needed for nausea or vomiting.     Potassium Chloride ER 20 MEQ Tbcr  Take 20 mEq by mouth daily.     prochlorperazine 10 MG tablet  Commonly known as:  COMPAZINE  Take 10 mg by mouth every 6 (six) hours as needed for nausea or vomiting.     traMADol 50 MG tablet  Commonly known as:  ULTRAM  Take 1 tablet (50 mg total) by mouth 2 (two) times daily.     verapamil 240 MG 24 hr capsule  Commonly known as:  VERELAN PM  Take 240 mg by mouth daily.     warfarin 3 MG  tablet  Commonly known as:  COUMADIN  Take 3 mg by mouth daily. Take 51m tablets for 2 days then take two 137mtablets (7m81m on the third day     warfarin 1 MG tablet  Commonly known as:  COUMADIN  Take 2 mg by mouth daily. Take 3mg46mblet for 2 days then take two  37m tablets (270m the third day       No Known Allergies     Follow-up Information   Follow up with STMathews ArgyleMD. Schedule an appointment as soon as possible for a visit in 1 week.   Specialty:  Internal Medicine   Contact information:   [address] [place] [phone]      The results of significant diagnostics from this hospitalization (including imaging, microbiology, ancillary and laboratory) are listed below for reference.    Significant Diagnostic Studies: Dg Bone Density  08/17/2013   CLINICAL DATA:  Postmenopausal.  EXAM: DUAL X-RAY ABSORPTIOMETRY (DXA) FOR BONE MINERAL DENSITY  FINDINGS: LEFT FOREARM  Bone Mineral Density (BMD):  0.673 g/cm2  Young Adult T-Score:  -0.3  Z-Score:  3.3  LEFT FEMUR NECK  Bone Mineral Density (BMD):  0.617 g/cm2  Young Adult T-Score: -2.1  Z-Score:  0.4  ASSESSMENT: Patient's diagnostic category is LOW BONE MASS by WHO Criteria.  FRACTURE RISK: MODERATE  FRAX: Based on the [place], the 10 year probability of a major osteoporotic fracture is 15%. The 10 year probability of a hip fracture is 4.5%.  COMPARISON: None.  Effective therapies are available in the form of bisphosphonates, selective estrogen receptor modulators, biologic agents, and hormone replacement therapy (for women). All patients should ensure an adequate intake of dietary calcium (1200 mg daily) and vitamin D (800 IU daily) unless contraindicated.  All treatment decisions require clinical judgment and consideration of individual patient factors, including patient preferences, co-morbidities, previous drug use, risk factors not captured in the FRAX model  (e.g., frailty, falls, vitamin D deficiency, increased bone turnover, interval significant decline in bone density) and possible under- or over-estimation of fracture risk by FRAX.  The National Osteoporosis Foundation recommends that FDA-approved medical therapies be considered in postmenopausal women and men age 3042r older with a:  1. Hip or vertebral (clinical or morphometric) fracture.  2. T-score of -2.5 or lower at the spine or hip.  3. Ten-year fracture probability by FRAX of 3% or greater for hip fracture or 20% or greater for major osteoporotic fracture.  People with diagnosed cases of osteoporosis or at high risk for fracture should have regular bone mineral density tests. For patients eligible for Medicare, routine testing is allowed once every 2 years. The testing frequency can be increased to one year for patients who have rapidly progressing disease, those who are receiving or discontinuing medical therapy to restore bone mass, or have additional risk factors.  World HePharmacologistWSt[facility] EtowahCriteria:  Normal: T-scores from +1.0 to -1.0  Low Bone Mass (Osteopenia): T-scores between -1.0 and -2.5  Osteoporosis: T-scores -2.5 and below  Comparison to Reference Population:  T-score is the key measure used in the diagnosis of osteoporosis and relative risk determination for fracture. It provides a value for bone mass relative to the mean bone mass of a young adult reference population expressed in terms of standard deviation (SD).  Z-score is the age-matched score showing the patient's values compared to a population matched for age, sex, and race. This is also expressed in terms of standard deviation. The patient may have values that compare favorably to the age-matched values and still be at increased risk for fracture.   Electronically Signed   By: SuCurlene Dolphin.D.   On: 08/17/2013 16:55    Microbiology: No results found for this or any previous visit (from the past 240 hour(s)).  Labs: Basic Metabolic Panel:  Recent Labs Lab 09/07/13 1318 09/13/13 0553 09/14/13 0515  NA 141 144 142  K 4.5 4.0 4.5  CL  --  103 102  CO2 28 28 30   GLUCOSE 208* 56* 224*  BUN 45.1* 49* 41*  CREATININE 1.8* 1.41* 1.66*  CALCIUM 9.9 9.4 9.2   Liver Function Tests:  Recent Labs Lab 09/07/13 1318 09/13/13 1106 09/14/13 0515  AST 13 17 10   ALT 14 16 13   ALKPHOS 81 74 72  BILITOT 0.55 0.6 0.7  PROT 6.6 6.5 5.9*  ALBUMIN 3.6 3.6 3.3*   No results found for this basename: LIPASE, AMYLASE,  in the last 168 hours No results found for this basename: AMMONIA,  in the last 168 hours CBC:  Recent Labs Lab 09/07/13 1318 09/13/13 0553 09/14/13 0515  WBC 7.0 10.8* 7.0  NEUTROABS 4.3 8.7*  --   HGB 10.5* 11.1* 9.9*  HCT 32.4* 33.2* 31.2*  MCV 90.6 89.2 92.0  PLT 244 283 225   Cardiac Enzymes:  Recent Labs Lab 09/13/13 1106 09/13/13 1706 09/13/13 2245  TROPONINI <0.30 <0.30 <0.30   BNP: BNP (last 3 results)  Recent Labs  06/07/13 1215  PROBNP 557.1*   CBG:  Recent Labs Lab 09/13/13 1756 09/13/13 2020 09/14/13 0013 09/14/13 0426 09/14/13 0747  GLUCAP 76 189* 231* 198* 181*       Signed:  Rowe Clack N  [facility] 09/14/2013, 10:45 AM

## 2013-09-14 NOTE — Progress Notes (Signed)
Patient taught how to check CBG. Patient was able to demonstrate to RN how to do it properly. Will discharge patient per order. Setzer, Marchelle Folks

## 2013-09-15 ENCOUNTER — Ambulatory Visit
Admission: RE | Admit: 2013-09-15 | Discharge: 2013-09-15 | Disposition: A | Discharge status: Home / Self Care | Payer: Medicare Other | Source: Ambulatory Visit | Attending: Radiation Oncology | Admitting: Radiation Oncology

## 2013-09-15 DIAGNOSIS — Z510 Encounter for antineoplastic radiation therapy: Secondary | ICD-10-CM | POA: Diagnosis not present

## 2013-09-16 ENCOUNTER — Ambulatory Visit
Admission: RE | Admit: 2013-09-16 | Discharge: 2013-09-16 | Disposition: A | Discharge status: Home / Self Care | Payer: Medicare Other | Source: Ambulatory Visit | Attending: Radiation Oncology | Admitting: Radiation Oncology

## 2013-09-16 DIAGNOSIS — Z510 Encounter for antineoplastic radiation therapy: Secondary | ICD-10-CM | POA: Diagnosis not present

## 2013-09-17 ENCOUNTER — Ambulatory Visit
Admission: RE | Admit: 2013-09-17 | Discharge: 2013-09-17 | Disposition: A | Discharge status: Home / Self Care | Payer: Medicare Other | Source: Ambulatory Visit | Attending: Radiation Oncology | Admitting: Radiation Oncology

## 2013-09-17 DIAGNOSIS — Z510 Encounter for antineoplastic radiation therapy: Secondary | ICD-10-CM | POA: Diagnosis not present

## 2013-09-18 ENCOUNTER — Other Ambulatory Visit (HOSPITAL_COMMUNITY): Payer: Self-pay | Admitting: Internal Medicine

## 2013-09-20 ENCOUNTER — Ambulatory Visit
Admission: RE | Admit: 2013-09-20 | Discharge: 2013-09-20 | Disposition: A | Discharge status: Home / Self Care | Payer: Medicare Other | Source: Ambulatory Visit | Attending: Radiation Oncology | Admitting: Radiation Oncology

## 2013-09-20 DIAGNOSIS — Z510 Encounter for antineoplastic radiation therapy: Secondary | ICD-10-CM | POA: Diagnosis not present

## 2013-09-21 ENCOUNTER — Ambulatory Visit
Admission: RE | Admit: 2013-09-21 | Discharge: 2013-09-21 | Disposition: A | Discharge status: Home / Self Care | Payer: Medicare Other | Source: Ambulatory Visit | Attending: Radiation Oncology | Admitting: Radiation Oncology

## 2013-09-21 ENCOUNTER — Encounter: Payer: Self-pay | Admitting: Radiation Oncology

## 2013-09-21 VITALS — BP 165/50 | HR 84 | Temp 97.7°F | Resp 20 | Wt 165.8 lb

## 2013-09-21 DIAGNOSIS — Z510 Encounter for antineoplastic radiation therapy: Secondary | ICD-10-CM | POA: Diagnosis not present

## 2013-09-21 DIAGNOSIS — C50411 Malignant neoplasm of upper-outer quadrant of right female breast: Secondary | ICD-10-CM

## 2013-09-21 NOTE — Progress Notes (Signed)
Weekly rad txs rt breast/sclav, 12 completed, no skin changes noted,  radiaplex bid, is fatigued, back hurts when getting off table but resolves by the time she gets to dressing room at nursing stated, good appetite, occasional twinges in right breast resolves quickly, no pain now, mild fatigue, stopped metformin, is taking arimidex 1mg  daily,mild hot flashes

## 2013-09-21 NOTE — Progress Notes (Signed)
Weekly Management Note Current Dose: 21.6  Gy  Projected Dose: 61 Gy   Narrative:  The patient presents for routine under treatment assessment.  CBCT/MVCT images/Port film x-rays were reviewed.  The chart was checked. Doing well. Taking arimidex x 1 month. Using radiaplex.  Physical Findings: Weight: 165 lb 12.8 oz (75.206 kg). Unchanged  Impression:  The patient is tolerating radiation.  Plan:  Continue treatment as planned. Continue radiaplex.

## 2013-09-21 NOTE — Addendum Note (Signed)
Encounter addended by: Thea Silversmith, MD on: 09/21/2013  1:17 PM<BR>     Documentation filed: Follow-up Section, LOS Section

## 2013-09-22 ENCOUNTER — Ambulatory Visit
Admission: RE | Admit: 2013-09-22 | Discharge: 2013-09-22 | Disposition: A | Discharge status: Home / Self Care | Payer: Medicare Other | Source: Ambulatory Visit | Attending: Radiation Oncology | Admitting: Radiation Oncology

## 2013-09-22 DIAGNOSIS — Z510 Encounter for antineoplastic radiation therapy: Secondary | ICD-10-CM | POA: Diagnosis not present

## 2013-09-23 ENCOUNTER — Ambulatory Visit
Admission: RE | Admit: 2013-09-23 | Discharge: 2013-09-23 | Disposition: A | Discharge status: Home / Self Care | Payer: Medicare Other | Source: Ambulatory Visit | Attending: Radiation Oncology | Admitting: Radiation Oncology

## 2013-09-23 DIAGNOSIS — Z510 Encounter for antineoplastic radiation therapy: Secondary | ICD-10-CM | POA: Diagnosis not present

## 2013-09-24 ENCOUNTER — Ambulatory Visit
Admission: RE | Admit: 2013-09-24 | Discharge: 2013-09-24 | Disposition: A | Discharge status: Home / Self Care | Payer: Medicare Other | Source: Ambulatory Visit | Attending: Radiation Oncology | Admitting: Radiation Oncology

## 2013-09-24 DIAGNOSIS — Z510 Encounter for antineoplastic radiation therapy: Secondary | ICD-10-CM | POA: Diagnosis not present

## 2013-09-27 ENCOUNTER — Other Ambulatory Visit: Payer: Self-pay | Admitting: *Deleted

## 2013-09-27 ENCOUNTER — Ambulatory Visit
Admission: RE | Admit: 2013-09-27 | Discharge: 2013-09-27 | Disposition: A | Discharge status: Home / Self Care | Payer: Medicare Other | Source: Ambulatory Visit | Attending: Radiation Oncology | Admitting: Radiation Oncology

## 2013-09-27 DIAGNOSIS — Z510 Encounter for antineoplastic radiation therapy: Secondary | ICD-10-CM | POA: Diagnosis not present

## 2013-09-27 DIAGNOSIS — C50411 Malignant neoplasm of upper-outer quadrant of right female breast: Secondary | ICD-10-CM

## 2013-09-28 ENCOUNTER — Telehealth: Payer: Self-pay | Admitting: *Deleted

## 2013-09-28 ENCOUNTER — Ambulatory Visit
Admission: RE | Admit: 2013-09-28 | Discharge: 2013-09-28 | Disposition: A | Discharge status: Home / Self Care | Payer: Medicare Other | Source: Ambulatory Visit | Attending: Radiation Oncology | Admitting: Radiation Oncology

## 2013-09-28 ENCOUNTER — Ambulatory Visit (HOSPITAL_BASED_OUTPATIENT_CLINIC_OR_DEPARTMENT_OTHER): Payer: Medicare Other

## 2013-09-28 ENCOUNTER — Telehealth: Payer: Self-pay | Admitting: Oncology

## 2013-09-28 ENCOUNTER — Ambulatory Visit (HOSPITAL_BASED_OUTPATIENT_CLINIC_OR_DEPARTMENT_OTHER): Payer: Medicare Other | Admitting: Oncology

## 2013-09-28 ENCOUNTER — Other Ambulatory Visit (HOSPITAL_BASED_OUTPATIENT_CLINIC_OR_DEPARTMENT_OTHER): Payer: Medicare Other

## 2013-09-28 ENCOUNTER — Encounter: Payer: Self-pay | Admitting: Radiation Oncology

## 2013-09-28 VITALS — BP 148/80 | HR 61 | Resp 16 | Wt 167.5 lb

## 2013-09-28 VITALS — BP 127/48 | HR 73 | Temp 97.5°F | Resp 18 | Ht 58.5 in | Wt 166.6 lb

## 2013-09-28 DIAGNOSIS — C50411 Malignant neoplasm of upper-outer quadrant of right female breast: Secondary | ICD-10-CM

## 2013-09-28 DIAGNOSIS — Z5112 Encounter for antineoplastic immunotherapy: Secondary | ICD-10-CM

## 2013-09-28 DIAGNOSIS — I2699 Other pulmonary embolism without acute cor pulmonale: Secondary | ICD-10-CM

## 2013-09-28 DIAGNOSIS — I82409 Acute embolism and thrombosis of unspecified deep veins of unspecified lower extremity: Secondary | ICD-10-CM

## 2013-09-28 DIAGNOSIS — C50919 Malignant neoplasm of unspecified site of unspecified female breast: Secondary | ICD-10-CM

## 2013-09-28 DIAGNOSIS — C773 Secondary and unspecified malignant neoplasm of axilla and upper limb lymph nodes: Secondary | ICD-10-CM

## 2013-09-28 DIAGNOSIS — Z510 Encounter for antineoplastic radiation therapy: Secondary | ICD-10-CM | POA: Diagnosis not present

## 2013-09-28 DIAGNOSIS — Z170 Estrogen receptor positive status [ER+]: Secondary | ICD-10-CM

## 2013-09-28 LAB — CBC WITH DIFFERENTIAL/PLATELET
BASO%: 0.9 % (ref 0.0–2.0)
Basophils Absolute: 0 10*3/uL (ref 0.0–0.1)
EOS%: 3.3 % (ref 0.0–7.0)
Eosinophils Absolute: 0.2 10*3/uL (ref 0.0–0.5)
HCT: 30.5 % — ABNORMAL LOW (ref 34.8–46.6)
HGB: 9.9 g/dL — ABNORMAL LOW (ref 11.6–15.9)
LYMPH%: 19.1 % (ref 14.0–49.7)
MCH: 29.5 pg (ref 25.1–34.0)
MCHC: 32.4 g/dL (ref 31.5–36.0)
MCV: 91 fL (ref 79.5–101.0)
MONO#: 0.4 10*3/uL (ref 0.1–0.9)
MONO%: 6.9 % (ref 0.0–14.0)
NEUT#: 3.7 10*3/uL (ref 1.5–6.5)
NEUT%: 69.8 % (ref 38.4–76.8)
Platelets: 176 10*3/uL (ref 145–400)
RBC: 3.35 10*6/uL — ABNORMAL LOW (ref 3.70–5.45)
RDW: 14.7 % — ABNORMAL HIGH (ref 11.2–14.5)
WBC: 5.3 10*3/uL (ref 3.9–10.3)
lymph#: 1 10*3/uL (ref 0.9–3.3)

## 2013-09-28 LAB — COMPREHENSIVE METABOLIC PANEL (CC13)
ALT: 16 U/L (ref 0–55)
AST: 13 U/L (ref 5–34)
Albumin: 3.5 g/dL (ref 3.5–5.0)
Alkaline Phosphatase: 79 U/L (ref 40–150)
Anion Gap: 9 mEq/L (ref 3–11)
BUN: 36.2 mg/dL — ABNORMAL HIGH (ref 7.0–26.0)
CO2: 29 mEq/L (ref 22–29)
Calcium: 9.4 mg/dL (ref 8.4–10.4)
Chloride: 101 mEq/L (ref 98–109)
Creatinine: 1.9 mg/dL — ABNORMAL HIGH (ref 0.6–1.1)
Glucose: 286 mg/dl — ABNORMAL HIGH (ref 70–140)
Potassium: 4 mEq/L (ref 3.5–5.1)
Sodium: 139 mEq/L (ref 136–145)
Total Bilirubin: 0.68 mg/dL (ref 0.20–1.20)
Total Protein: 6.3 g/dL — ABNORMAL LOW (ref 6.4–8.3)

## 2013-09-28 MED ORDER — DIPHENHYDRAMINE HCL 25 MG PO CAPS
ORAL_CAPSULE | ORAL | Status: AC
  Filled 2013-09-28: qty 1

## 2013-09-28 MED ORDER — SODIUM CHLORIDE 0.9 % IV SOLN
Freq: Once | INTRAVENOUS | Status: AC
  Administered 2013-09-28: 11:00:00 via INTRAVENOUS

## 2013-09-28 MED ORDER — PROCHLORPERAZINE EDISYLATE 5 MG/ML IJ SOLN
10.0000 mg | Freq: Four times a day (QID) | INTRAMUSCULAR | Status: DC | PRN
  Administered 2013-09-28: 10 mg via INTRAVENOUS

## 2013-09-28 MED ORDER — PROCHLORPERAZINE EDISYLATE 5 MG/ML IJ SOLN
INTRAMUSCULAR | Status: AC
  Filled 2013-09-28: qty 2

## 2013-09-28 MED ORDER — ACETAMINOPHEN 325 MG PO TABS
ORAL_TABLET | ORAL | Status: AC
  Filled 2013-09-28: qty 2

## 2013-09-28 MED ORDER — DIPHENHYDRAMINE HCL 25 MG PO CAPS
25.0000 mg | ORAL_CAPSULE | Freq: Once | ORAL | Status: AC
  Administered 2013-09-28: 25 mg via ORAL

## 2013-09-28 MED ORDER — ACETAMINOPHEN 325 MG PO TABS
650.0000 mg | ORAL_TABLET | Freq: Once | ORAL | Status: AC
  Administered 2013-09-28: 650 mg via ORAL

## 2013-09-28 MED ORDER — SODIUM CHLORIDE 0.9 % IJ SOLN
10.0000 mL | INTRAMUSCULAR | Status: DC | PRN
  Administered 2013-09-28: 10 mL
  Filled 2013-09-28: qty 10

## 2013-09-28 MED ORDER — RADIAPLEXRX EX GEL
Freq: Once | CUTANEOUS | Status: AC
  Administered 2013-09-28: 13:00:00 via TOPICAL

## 2013-09-28 MED ORDER — HEPARIN SOD (PORK) LOCK FLUSH 100 UNIT/ML IV SOLN
500.0000 [IU] | Freq: Once | INTRAVENOUS | Status: AC | PRN
Start: 2013-09-28 — End: 2013-09-28
  Administered 2013-09-28: 500 [IU]
  Filled 2013-09-28: qty 5

## 2013-09-28 MED ORDER — SODIUM CHLORIDE 0.9 % IV SOLN
6.0000 mg/kg | Freq: Once | INTRAVENOUS | Status: AC
  Administered 2013-09-28: 462 mg via INTRAVENOUS
  Filled 2013-09-28: qty 22

## 2013-09-28 NOTE — Telephone Encounter (Signed)
Per POF staff phone call scheduled appts. Advised schedulers 

## 2013-09-28 NOTE — Progress Notes (Signed)
Per Dr. Jana Hakim ok to treat patient with elevated creatinine.

## 2013-09-28 NOTE — Progress Notes (Signed)
[facility]  [phone] [phone]     ID: [name] DOB: January 31, 1927  MR#: [id number]  WGY#:[id number]  PCP: [name], MD GYN: SU:  OTHER MD:  CHIEF COMPLAINT:   CURRENT TREATMENT:    BREAST CANCER HISTORY: From Dr. Bernell List Khan's intake note 03/24/2013:  "Sabrina [name] is a 80 y.o. female. Who underwent a screening mammogram. She was found to have a right breast mass measuring 1 cm. There were no abnormalities on her physical exam. Patient had ultrasound performed that showed 1.3 x 1.2 x 1.0 cm hypoechoic mass in the upper outer quadrant of the right breast. In the right axilla there was an abnormal appearing lymph node measuring 1.65 1.4 x 0.8 cm. Both her biopsy. The lymph node was positive for metastatic carcinoma. The primary breast tumor biopsy showed invasive ductal carcinoma, grade 2, ER positive PR positive HER-2/neu equivocal with a proliferation marker Ki-67 15%.."  On 03/30/2013 the patient underwent right lumpectomy in right axillary lymph node sampling. The pathology ([id number]) showed a 1.4 cm invasive ductal carcinoma grade 3, present at the lateral and posterior margins. 3 axillary lymph nodes (non-sentinel lymph nodes) were sampled, one of which had a micrometastatic tumor deposits with extracapsular extension. Repeat HER-2 was positive, with a signals ratio of 2.54 and the number per cell of 3.55.  The patient was started on weekly Taxol and trastuzumab, but tolerated treatment poorly and develop bilateral DVTs and pulmonary embolism April 2015. At that time her chemotherapy was interrupted.  Her subsequent history is as detailed below.   INTERVAL HISTORY: Lovely returns today for followup of her breast cancer accompanied by her husband Sabrina [name]. She is currently receiving radiation treatments, and tolerating them well. She was started on anastrozole in June, and reports no side effects from that medication. She is  establishing herself in my service today  REVIEW OF SYSTEMS: She had a hypoglycemic episode approximately 2 weeks ago, which led them to call 911. Her anti-hyperglycemic indications have been adjusted by Dr. Felipa Eth. She also has her Coumadin monitored through his clinic. Amirra still has shortness of breath when going up stairs, but that was not different from before the pulmonary emboli. Just a little bit of a dry cough. Occasionally her ankles swell. Her dentures do not fit well. She does not feel well enough to start exercising regularly. Aside from these issues, and a detailed review of systems today was stable  PAST MEDICAL HISTORY: Past Medical History  Diagnosis Date  . Heart murmur     no known problems; states did not know she had murmur until age 26  . Immature cataract   . Arthritis     neck  . Wears partial dentures     lower  . Dental crowns present   . Hypertension     fluctuates, especially when stressed; has been on med. > 20 yr.  . Non-insulin dependent type 2 diabetes mellitus   . High cholesterol   . Chronic kidney disease (CKD), stage III (moderate)     nephrologist, Dr. [name]  . Breast cancer 03/2013    right    PAST SURGICAL HISTORY: Past Surgical History  Procedure Laterality Date  . Tonsillectomy      as a child  . Breast surgery Right 11/1958    right breast biopsy benign  . Dilation and curettage of uterus    . Breast lumpectomy with needle localization Right 03/30/2013    Procedure: BREAST LUMPECTOMY  WITH NEEDLE LOCALIZATION;  Surgeon: Rolm Bookbinder, MD;  Location: [facility];  Service: General;  Laterality: Right;  . Axillary lymph node dissection Right 03/30/2013    Procedure: AXILLARY LYMPH NODE DISSECTION;  Surgeon: Rolm Bookbinder, MD;  Location: [facility];  Service: General;  Laterality: Right;  . Re-excision of breast cancer,superior margins Right 04/15/2013    Procedure: RE-EXCISION OF RIGHT BREAST  MARGINS;  Surgeon: Rolm Bookbinder,  MD;  Location: [facility];  Service: General;  Laterality: Right;  . Portacath placement N/A 04/15/2013    Procedure: INSERTION PORT-A-CATH;  Surgeon: Rolm Bookbinder, MD;  Location: [facility];  Service: General;  Laterality: N/A;    FAMILY HISTORY Family History  Problem Relation Age of Onset  . Pneumonia Mother   . Heart attack Father   . Breast cancer Other 39    niece  . Breast cancer Other 4    niece   the patient's father died in his 55s, in the setting of multiple medical problems. The patient's mother died in her 28s from pneumonia. The patient had 4 sisters, one of whom has died from complications of diabetes. She had a brother who died at 77 months. There is no history of breast cancer in the immediate family although the patient does have 2 nieces with breast cancer. The patient has been tested for the BRCA mutations and her genetics was normal  GYNECOLOGIC HISTORY:  No LMP recorded. Patient is postmenopausal. Menarche age 45, the patient is GX P0. She went through menopause in her 75s, took hormone replacement until she was in her 5s.   SOCIAL HISTORY:  [name] worked as the [employer] and locally. She and her husband Sabrina [name] greatly enjoyed Easter music [place]. Sabrina [name] worked for the [employer] in [place].    ADVANCED DIRECTIVES: In place   HEALTH MAINTENANCE: History  Substance Use Topics  . Smoking status: Never Smoker   . Smokeless tobacco: Never Used     Comment: only smoked 2 packs cigarettes total  . Alcohol Use: 0.0 oz/week     Comment: 2-3 glasses wine/week     Colonoscopy:  PAP:  Bone density:  Lipid panel:  No Known Allergies  Current Outpatient Prescriptions  Medication Sig Dispense Refill  . anastrozole (ARIMIDEX) 1 MG tablet Take 1 tablet (1 mg total) by mouth daily.  30 tablet  3  . atorvastatin (LIPITOR) 10 MG tablet Take 10 mg by mouth daily.      . calcium-vitamin D (OSCAL WITH D) 250-125 MG-UNIT per tablet  Take 1 tablet by mouth 2 (two) times daily.      . cholecalciferol (VITAMIN D) 1000 UNITS tablet Take 1,000 Units by mouth daily.      . cloNIDine (CATAPRES) 0.2 MG tablet Take 0.2 mg by mouth 2 (two) times daily.      [name] dexamethasone (DECADRON) 4 MG tablet Take 4 mg by mouth 2 (two) times daily. After chemo for 3 days      . furosemide (LASIX) 40 MG tablet Take 1 tablet (40 mg total) by mouth daily. Take extra tab as needed for swelling  60 tablet  6  . glipiZIDE (GLUCOTROL) 2.5 mg TABS tablet Take 0.5 tablets (2.5 mg total) by mouth 2 (two) times daily before a meal.  60 tablet  1  . glucose blood test strip Use as instructed  100 each  12  . glucose monitoring kit (FREESTYLE) monitoring kit 1 each by Does not apply route as needed  for other.  1 each  1  . HYDROcodone-acetaminophen (NORCO/VICODIN) 5-325 MG per tablet Take 1-2 tablets by mouth every 4 (four) hours as needed for moderate pain.  65 tablet  0  . IRON PO Take 65 mg by mouth daily.      [name] lidocaine-prilocaine (EMLA) cream Apply 1 - 2 hours to port -a-cath before being accessed.  30 g  6  . LORazepam (ATIVAN) 0.5 MG tablet Take 1 tablet (0.5 mg total) by mouth every 6 (six) hours as needed (Nausea or vomiting).  65 tablet  0  . nystatin ointment (MYCOSTATIN) Apply 1 application topically 2 (two) times daily as needed (skin).       . nystatin-triamcinolone (MYCOLOG II) cream Apply 1 application topically 2 (two) times daily.       . ondansetron (ZOFRAN) 4 MG tablet Take 1 tablet (4 mg total) by mouth every 8 (eight) hours as needed for nausea or vomiting.  65 tablet  0  . potassium chloride SA (K-DUR,KLOR-CON) 20 MEQ tablet TAKE 1 TABLET ONCE DAILY.  30 tablet  6  . prochlorperazine (COMPAZINE) 10 MG tablet Take 10 mg by mouth every 6 (six) hours as needed for nausea or vomiting.       . traMADol (ULTRAM) 50 MG tablet Take 1 tablet (50 mg total) by mouth 2 (two) times daily.  60 tablet  1  . verapamil (VERELAN PM) 240 MG 24 hr capsule  Take 240 mg by mouth daily.       [name] warfarin (COUMADIN) 1 MG tablet Take 2 mg by mouth daily. Take 28m tablet for 2 days then take two 173mtablets (17m49mthe third day      . warfarin (COUMADIN) 3 MG tablet Take 3 mg by mouth daily. Take 3mg29mblets for 2 days then take two 1mg 33mlets (17mg) 55m the third day       No current facility-administered medications for this visit.    OBJECTIVE: Elderly white woman Filed Vitals:   09/28/13 0911  BP: 127/48  Pulse: 73  Temp: 97.5 F (36.4 C)  Resp: 18     Body mass index is 34.22 kg/(m^2).    ECOG FS:1 - Symptomatic but completely ambulatory  Ocular: Sclerae unicteric, EOMs intact Ear-nose-throat: Oropharynx clear, no thrush or other lesions Lymphatic: No cervical or supraclavicular adenopathy Lungs no rales or rhonchi, no rubs Heart regular rate and rhythm Abd soft, nontender, positive bowel sounds MSK no focal spinal tenderness, no upper extremity lymphedema Neuro: non-focal, well-oriented, appropriate affect Breasts: Deferred   LAB RESULTS:  CMP     Component Value Date/Time   NA 142 09/14/2013 0515   NA 141 09/07/2013 1318   K 4.5 09/14/2013 0515   K 4.5 09/07/2013 1318   CL 102 09/14/2013 0515   CO2 30 09/14/2013 0515   CO2 28 09/07/2013 1318   GLUCOSE 224* 09/14/2013 0515   GLUCOSE 208* 09/07/2013 1318   BUN 41* 09/14/2013 0515   BUN 45.1* 09/07/2013 1318   CREATININE 1.66* 09/14/2013 0515   CREATININE 1.8* 09/07/2013 1318   CALCIUM 9.2 09/14/2013 0515   CALCIUM 9.9 09/07/2013 1318   PROT 5.9* 09/14/2013 0515   PROT 6.6 09/07/2013 1318   ALBUMIN 3.3* 09/14/2013 0515   ALBUMIN 3.6 09/07/2013 1318   AST 10 09/14/2013 0515   AST 13 09/07/2013 1318   ALT 13 09/14/2013 0515   ALT 14 09/07/2013 1318   ALKPHOS 72 09/14/2013 0515   ALKPHOS 81 09/07/2013 1318  BILITOT 0.7 09/14/2013 0515   BILITOT 0.55 09/07/2013 1318   GFRNONAA 27* 09/14/2013 0515   GFRAA 32* 09/14/2013 0515    I No results found for this basename: SPEP, UPEP,  kappa and lambda light  chains    Lab Results  Component Value Date   WBC 5.3 09/28/2013   NEUTROABS 3.7 09/28/2013   HGB 9.9* 09/28/2013   HCT 30.5* 09/28/2013   MCV 91.0 09/28/2013   PLT 176 09/28/2013      Chemistry      Component Value Date/Time   NA 142 09/14/2013 0515   NA 141 09/07/2013 1318   K 4.5 09/14/2013 0515   K 4.5 09/07/2013 1318   CL 102 09/14/2013 0515   CO2 30 09/14/2013 0515   CO2 28 09/07/2013 1318   BUN 41* 09/14/2013 0515   BUN 45.1* 09/07/2013 1318   CREATININE 1.66* 09/14/2013 0515   CREATININE 1.8* 09/07/2013 1318      Component Value Date/Time   CALCIUM 9.2 09/14/2013 0515   CALCIUM 9.9 09/07/2013 1318   ALKPHOS 72 09/14/2013 0515   ALKPHOS 81 09/07/2013 1318   AST 10 09/14/2013 0515   AST 13 09/07/2013 1318   ALT 13 09/14/2013 0515   ALT 14 09/07/2013 1318   BILITOT 0.7 09/14/2013 0515   BILITOT 0.55 09/07/2013 1318       No results found for this basename: LABCA2    No components found with this basename: LABCA125    No results found for this basename: INR,  in the last 168 hours  Urinalysis    Component Value Date/Time   COLORURINE YELLOW 09/13/2013 [place] 09/13/2013 0523   LABSPEC 1.010 09/13/2013 0523   PHURINE 7.0 09/13/2013 0523   GLUCOSEU 100* 09/13/2013 0523   HGBUR NEGATIVE 09/13/2013 0523   BILIRUBINUR NEGATIVE 09/13/2013 0523   KETONESUR NEGATIVE 09/13/2013 0523   PROTEINUR NEGATIVE 09/13/2013 0523   UROBILINOGEN 0.2 09/13/2013 0523   NITRITE NEGATIVE 09/13/2013 0523   LEUKOCYTESUR MODERATE* 09/13/2013 0523    STUDIES: Transthoracic Echocardiography  Patient: Sabrina [name], Wahlert MR #: [id number] Study Date: 08/11/2013 Gender: F Age: 55 Height: 147.3 cm Weight: 75.8 kg BSA: 1.8 m^2 Pt. Status: Room:  ATTENDING [name] [id number], [place] ORDERING Bensimhon, Glynis Smiles Glori Bickers SONOGRAPHER Jimmy Reel, RDCS PERFORMING [facility], Outpatient  cc:  ------------------------------------------------------------------- LV  EF: 60% - 65%    ASSESSMENT: 80 y.o.  woman status post right lumpectomy and axillary lymph node sampling 03/30/2013 for a pT1c pN1a, stage II 8 invasive ductal carcinoma, grade 3, estrogen receptor 100% positive, progesterone receptor 100% positive, with an MIB-1 of 15%, and HER-2 amplified, with a signals ratio of 2.54 and the number per cell being 3.55.  (a) margins were positive but clear with additional surgery to 04/15/2013 [phone])  (1) adjuvant chemotherapy /immunotherapy with paclitaxel and trastuzumab weekly started 05/10/2013, discontinued after 2 doses because of poor tolerance  (2) ventilation/perfusion scan 06/10/2013 documented right lower lobe pulmonary emboli; Doppler ultrasound 06/11/2013 documented bilateral lower extremity DVTs; started on warfarin managed through Dr. Carlyle Lipa office  (3) anastrozole started June 2015  (4) adjuvant radiation to be completed 10/20/2013  (5) Trastuzumab resumed 09/07/2013,to be continued at least through March 2016; most recent echocardiogram June 2015 showed a well preserved ejection fraction  (6) genetics testing April 2015 was normal and did not reveal a mutation in any of these genes: APC, ATM, AXIN2, BARD1, BMPRIA, BRCA1, BRCA2, BRIP1, CDH1, CDK4, CDKN2A, CHEK2, EPCAM,  FANCC, MLH1, MSH2, MSH6, MUTYH, NBN, PALB2, PMS2, PTEN, RAD51C, SMAD4, STK11, TP53, VHL, and XRCC2  PLAN: We spent approximately 50 minutes going over her hopes overall situation. She is recovering nicely from her attempt at chemotherapy. She is tolerating the Herceptin without any side effects that she is aware of. She understands she does not need to take Zofran, Compazine, lorazepam, or dexamethasone, all of which were chemotherapy premeds, and all of which we are stopping. The plan as far as Herceptin is concerned is to continue it at least through March of next year him and possibly into May, since she missed 2 months of treatment because of the pulmonary  embolus problem.  She understands we normally do anticoagulation for 6-12 months. We are leaving that at the discretion of Dr. Felipa Eth.  From a breast cancer point of view I think her prognosis is good. In general antiestrogens cut the risk of recurrence in half, and anti-HER-2 treatment cuts the risk of recurrence again in half, so I am hopeful she will do well long term  I am concerned about her bone density, which is close to osteoporosis. She is already on calcium and vitamin D supplementation. She is not going to be able to exercise sufficiently given her multiple comorbidities. We will consider bisphosphonates at the next visit.  Latayvia has a good understanding of the overall plan. She agrees with it. She knows the goal of treatment in her case is cure. She will call with any problems that may develop before her next visit here.  Chauncey Cruel, MD   09/28/2013 9:45 AM

## 2013-09-28 NOTE — Progress Notes (Signed)
Weekly Management Note:  Site: Right breast/regional lymph nodes Current Dose:  3060  cGy Projected Dose: 4500  cGy  Narrative: The patient is seen today for routine under treatment assessment. CBCT/MVCT images/port films were reviewed. The chart was reviewed.   She is without complaints today although she does report mild fatigue. She uses Radioplex gel.  Physical Examination:  Filed Vitals:   09/28/13 1311  BP: 148/80  Pulse: 61  Resp: 16  .  Weight: 167 lb 8 oz (75.978 kg). There is mild erythema the skin along the right breast. No areas of desquamation.  Impression: Tolerating radiation therapy well.  Plan: Continue radiation therapy as planned.

## 2013-09-28 NOTE — Patient Instructions (Signed)

## 2013-09-28 NOTE — Progress Notes (Signed)
Weight and vitals stable. Reports using radiaplex on treated skin bid as directed. Provided patient with additional tube of radiaplex. Hyperpigmentation without desquamation of treatment area noted. Reports mild fatigue.

## 2013-09-28 NOTE — Telephone Encounter (Signed)
per pof to sch ECHO-Linda D to pre-cert will sch after reply

## 2013-09-28 NOTE — Telephone Encounter (Signed)
per pof to sch pt appt-Sabrina [name] trmt-pt will get updated copy of sch in trmt room

## 2013-09-29 ENCOUNTER — Ambulatory Visit
Admission: RE | Admit: 2013-09-29 | Discharge: 2013-09-29 | Disposition: A | Discharge status: Home / Self Care | Payer: Medicare Other | Source: Ambulatory Visit | Attending: Radiation Oncology | Admitting: Radiation Oncology

## 2013-09-29 DIAGNOSIS — Z510 Encounter for antineoplastic radiation therapy: Secondary | ICD-10-CM | POA: Diagnosis not present

## 2013-09-29 NOTE — Addendum Note (Signed)
Addended by: Prentiss Bells on: 09/29/2013 08:31 AM   Modules accepted: Orders, Medications

## 2013-09-30 ENCOUNTER — Ambulatory Visit
Admission: RE | Admit: 2013-09-30 | Discharge: 2013-09-30 | Disposition: A | Discharge status: Home / Self Care | Payer: Medicare Other | Source: Ambulatory Visit | Attending: Radiation Oncology | Admitting: Radiation Oncology

## 2013-09-30 DIAGNOSIS — Z510 Encounter for antineoplastic radiation therapy: Secondary | ICD-10-CM | POA: Diagnosis not present

## 2013-10-01 ENCOUNTER — Ambulatory Visit
Admission: RE | Admit: 2013-10-01 | Discharge: 2013-10-01 | Disposition: A | Discharge status: Home / Self Care | Payer: Medicare Other | Source: Ambulatory Visit | Attending: Radiation Oncology | Admitting: Radiation Oncology

## 2013-10-01 DIAGNOSIS — Z510 Encounter for antineoplastic radiation therapy: Secondary | ICD-10-CM | POA: Diagnosis not present

## 2013-10-04 ENCOUNTER — Ambulatory Visit
Admission: RE | Admit: 2013-10-04 | Discharge: 2013-10-04 | Disposition: A | Discharge status: Home / Self Care | Payer: Medicare Other | Source: Ambulatory Visit | Attending: Radiation Oncology | Admitting: Radiation Oncology

## 2013-10-04 DIAGNOSIS — Z510 Encounter for antineoplastic radiation therapy: Secondary | ICD-10-CM | POA: Diagnosis not present

## 2013-10-05 ENCOUNTER — Ambulatory Visit: Payer: Medicare Other | Admitting: Radiation Oncology

## 2013-10-05 ENCOUNTER — Ambulatory Visit
Admission: RE | Admit: 2013-10-05 | Discharge: 2013-10-05 | Disposition: A | Discharge status: Home / Self Care | Payer: Medicare Other | Source: Ambulatory Visit | Attending: Radiation Oncology | Admitting: Radiation Oncology

## 2013-10-05 DIAGNOSIS — C50411 Malignant neoplasm of upper-outer quadrant of right female breast: Secondary | ICD-10-CM

## 2013-10-05 DIAGNOSIS — Z510 Encounter for antineoplastic radiation therapy: Secondary | ICD-10-CM | POA: Diagnosis not present

## 2013-10-05 NOTE — Progress Notes (Signed)
Weekly Management Note Current Dose:  39.6 Gy  Projected Dose: 61 Gy   Narrative:  The patient presents for routine under treatment assessment.  CBCT/MVCT images/Port film x-rays were reviewed.  The chart was checked. No complaints except fatigue. Using radiaplex.  Physical Findings: Saw on tx machine to verify eboost. No skin changes.   Impression:  The patient is tolerating radiation.  Plan:  Continue treatment as planned. Continue radiaplex.

## 2013-10-06 ENCOUNTER — Ambulatory Visit
Admission: RE | Admit: 2013-10-06 | Discharge: 2013-10-06 | Disposition: A | Discharge status: Home / Self Care | Payer: Medicare Other | Source: Ambulatory Visit | Attending: Radiation Oncology | Admitting: Radiation Oncology

## 2013-10-06 DIAGNOSIS — Z510 Encounter for antineoplastic radiation therapy: Secondary | ICD-10-CM | POA: Diagnosis not present

## 2013-10-07 ENCOUNTER — Ambulatory Visit
Admission: RE | Admit: 2013-10-07 | Discharge: 2013-10-07 | Disposition: A | Discharge status: Home / Self Care | Payer: Medicare Other | Source: Ambulatory Visit | Attending: Radiation Oncology | Admitting: Radiation Oncology

## 2013-10-07 ENCOUNTER — Ambulatory Visit: Payer: Medicare Other | Admitting: Radiation Oncology

## 2013-10-07 DIAGNOSIS — Z510 Encounter for antineoplastic radiation therapy: Secondary | ICD-10-CM | POA: Diagnosis not present

## 2013-10-08 ENCOUNTER — Ambulatory Visit
Admission: RE | Admit: 2013-10-08 | Discharge: 2013-10-08 | Disposition: A | Discharge status: Home / Self Care | Payer: Medicare Other | Source: Ambulatory Visit | Attending: Radiation Oncology | Admitting: Radiation Oncology

## 2013-10-08 DIAGNOSIS — Z510 Encounter for antineoplastic radiation therapy: Secondary | ICD-10-CM | POA: Diagnosis not present

## 2013-10-10 DIAGNOSIS — Z510 Encounter for antineoplastic radiation therapy: Secondary | ICD-10-CM | POA: Diagnosis not present

## 2013-10-11 ENCOUNTER — Ambulatory Visit
Admission: RE | Admit: 2013-10-11 | Discharge: 2013-10-11 | Disposition: A | Discharge status: Home / Self Care | Payer: Medicare Other | Source: Ambulatory Visit | Attending: Radiation Oncology | Admitting: Radiation Oncology

## 2013-10-11 DIAGNOSIS — Z510 Encounter for antineoplastic radiation therapy: Secondary | ICD-10-CM | POA: Diagnosis not present

## 2013-10-12 ENCOUNTER — Ambulatory Visit
Admission: RE | Admit: 2013-10-12 | Discharge: 2013-10-12 | Disposition: A | Discharge status: Home / Self Care | Payer: Medicare Other | Source: Ambulatory Visit | Attending: Radiation Oncology | Admitting: Radiation Oncology

## 2013-10-12 ENCOUNTER — Telehealth: Payer: Self-pay | Admitting: Oncology

## 2013-10-12 VITALS — BP 159/57 | HR 69 | Temp 97.6°F | Wt 166.4 lb

## 2013-10-12 DIAGNOSIS — C50411 Malignant neoplasm of upper-outer quadrant of right female breast: Secondary | ICD-10-CM

## 2013-10-12 DIAGNOSIS — Z510 Encounter for antineoplastic radiation therapy: Secondary | ICD-10-CM | POA: Diagnosis not present

## 2013-10-12 NOTE — Progress Notes (Signed)
Weekly Management Note Current Dose: 49  Gy  Projected Dose: 61 Gy   Narrative:  The patient presents for routine under treatment assessment.  CBCT/MVCT images/Port film x-rays were reviewed.  The chart was checked. Sacral and back pain due to fall. Worse with movement. No skin complaints. Using radiaplex.   Physical Findings: Weight: 166 lb 6.4 oz (75.479 kg). Slightly pink right breast.   Impression:  The patient is tolerating radiation.  Plan:  Continue treatment as planned. Continue radiaplex.

## 2013-10-12 NOTE — Telephone Encounter (Signed)
Cld & spoke to pt in re to ECHO sch-gave pt time-date-location-pt understood

## 2013-10-13 ENCOUNTER — Ambulatory Visit
Admission: RE | Admit: 2013-10-13 | Discharge: 2013-10-13 | Disposition: A | Discharge status: Home / Self Care | Payer: Medicare Other | Source: Ambulatory Visit | Attending: Radiation Oncology | Admitting: Radiation Oncology

## 2013-10-13 DIAGNOSIS — Z510 Encounter for antineoplastic radiation therapy: Secondary | ICD-10-CM | POA: Diagnosis not present

## 2013-10-14 ENCOUNTER — Ambulatory Visit
Admission: RE | Admit: 2013-10-14 | Discharge: 2013-10-14 | Disposition: A | Discharge status: Home / Self Care | Payer: Medicare Other | Source: Ambulatory Visit | Attending: Radiation Oncology | Admitting: Radiation Oncology

## 2013-10-14 DIAGNOSIS — Z510 Encounter for antineoplastic radiation therapy: Secondary | ICD-10-CM | POA: Diagnosis not present

## 2013-10-15 ENCOUNTER — Ambulatory Visit
Admission: RE | Admit: 2013-10-15 | Discharge: 2013-10-15 | Disposition: A | Discharge status: Home / Self Care | Payer: Medicare Other | Source: Ambulatory Visit | Attending: Radiation Oncology | Admitting: Radiation Oncology

## 2013-10-15 DIAGNOSIS — Z510 Encounter for antineoplastic radiation therapy: Secondary | ICD-10-CM | POA: Diagnosis not present

## 2013-10-18 ENCOUNTER — Ambulatory Visit
Admission: RE | Admit: 2013-10-18 | Discharge: 2013-10-18 | Disposition: A | Discharge status: Home / Self Care | Payer: Medicare Other | Source: Ambulatory Visit | Attending: Radiation Oncology | Admitting: Radiation Oncology

## 2013-10-18 DIAGNOSIS — Z510 Encounter for antineoplastic radiation therapy: Secondary | ICD-10-CM | POA: Diagnosis not present

## 2013-10-19 ENCOUNTER — Other Ambulatory Visit: Payer: Self-pay | Admitting: *Deleted

## 2013-10-19 ENCOUNTER — Ambulatory Visit
Admission: RE | Admit: 2013-10-19 | Discharge: 2013-10-19 | Disposition: A | Discharge status: Home / Self Care | Payer: Medicare Other | Source: Ambulatory Visit | Attending: Radiation Oncology | Admitting: Radiation Oncology

## 2013-10-19 ENCOUNTER — Ambulatory Visit (HOSPITAL_BASED_OUTPATIENT_CLINIC_OR_DEPARTMENT_OTHER): Payer: Medicare Other

## 2013-10-19 ENCOUNTER — Other Ambulatory Visit (HOSPITAL_BASED_OUTPATIENT_CLINIC_OR_DEPARTMENT_OTHER): Payer: Medicare Other

## 2013-10-19 ENCOUNTER — Encounter: Payer: Self-pay | Admitting: Radiation Oncology

## 2013-10-19 ENCOUNTER — Other Ambulatory Visit: Payer: Self-pay | Admitting: Oncology

## 2013-10-19 VITALS — BP 141/53 | HR 56 | Temp 97.6°F | Ht 70.5 in | Wt 167.2 lb

## 2013-10-19 DIAGNOSIS — C50411 Malignant neoplasm of upper-outer quadrant of right female breast: Secondary | ICD-10-CM

## 2013-10-19 DIAGNOSIS — C50919 Malignant neoplasm of unspecified site of unspecified female breast: Secondary | ICD-10-CM

## 2013-10-19 DIAGNOSIS — Z510 Encounter for antineoplastic radiation therapy: Secondary | ICD-10-CM | POA: Diagnosis not present

## 2013-10-19 DIAGNOSIS — Z5111 Encounter for antineoplastic chemotherapy: Secondary | ICD-10-CM

## 2013-10-19 LAB — CBC WITH DIFFERENTIAL/PLATELET
BASO%: 0.7 % (ref 0.0–2.0)
Basophils Absolute: 0 10*3/uL (ref 0.0–0.1)
EOS%: 1.9 % (ref 0.0–7.0)
Eosinophils Absolute: 0.1 10*3/uL (ref 0.0–0.5)
HCT: 32.8 % — ABNORMAL LOW (ref 34.8–46.6)
HGB: 10.4 g/dL — ABNORMAL LOW (ref 11.6–15.9)
LYMPH%: 12.4 % — ABNORMAL LOW (ref 14.0–49.7)
MCH: 28.6 pg (ref 25.1–34.0)
MCHC: 31.8 g/dL (ref 31.5–36.0)
MCV: 89.8 fL (ref 79.5–101.0)
MONO#: 0.5 10*3/uL (ref 0.1–0.9)
MONO%: 9.4 % (ref 0.0–14.0)
NEUT#: 4.1 10*3/uL (ref 1.5–6.5)
NEUT%: 75.6 % (ref 38.4–76.8)
Platelets: 208 10*3/uL (ref 145–400)
RBC: 3.66 10*6/uL — ABNORMAL LOW (ref 3.70–5.45)
RDW: 14.9 % — ABNORMAL HIGH (ref 11.2–14.5)
WBC: 5.4 10*3/uL (ref 3.9–10.3)
lymph#: 0.7 10*3/uL — ABNORMAL LOW (ref 0.9–3.3)

## 2013-10-19 LAB — COMPREHENSIVE METABOLIC PANEL (CC13)
ALT: 9 U/L (ref 0–55)
AST: 11 U/L (ref 5–34)
Albumin: 3.6 g/dL (ref 3.5–5.0)
Alkaline Phosphatase: 86 U/L (ref 40–150)
Anion Gap: 7 mEq/L (ref 3–11)
BUN: 42.6 mg/dL — ABNORMAL HIGH (ref 7.0–26.0)
CO2: 32 mEq/L — ABNORMAL HIGH (ref 22–29)
Calcium: 9.7 mg/dL (ref 8.4–10.4)
Chloride: 102 mEq/L (ref 98–109)
Creatinine: 1.8 mg/dL — ABNORMAL HIGH (ref 0.6–1.1)
Glucose: 183 mg/dl — ABNORMAL HIGH (ref 70–140)
Potassium: 4 mEq/L (ref 3.5–5.1)
Sodium: 141 mEq/L (ref 136–145)
Total Bilirubin: 0.51 mg/dL (ref 0.20–1.20)
Total Protein: 6.6 g/dL (ref 6.4–8.3)

## 2013-10-19 MED ORDER — DIPHENHYDRAMINE HCL 25 MG PO CAPS
25.0000 mg | ORAL_CAPSULE | Freq: Once | ORAL | Status: AC
  Administered 2013-10-19: 25 mg via ORAL

## 2013-10-19 MED ORDER — TRASTUZUMAB CHEMO INJECTION 440 MG
6.0000 mg/kg | Freq: Once | INTRAVENOUS | Status: AC
  Administered 2013-10-19: 462 mg via INTRAVENOUS
  Filled 2013-10-19: qty 22

## 2013-10-19 MED ORDER — ACETAMINOPHEN 325 MG PO TABS
650.0000 mg | ORAL_TABLET | Freq: Once | ORAL | Status: AC
  Administered 2013-10-19: 650 mg via ORAL

## 2013-10-19 MED ORDER — RADIAPLEXRX EX GEL
Freq: Once | CUTANEOUS | Status: AC
  Administered 2013-10-19: 15:00:00 via TOPICAL

## 2013-10-19 MED ORDER — DIPHENHYDRAMINE HCL 25 MG PO CAPS
ORAL_CAPSULE | ORAL | Status: AC
  Filled 2013-10-19: qty 1

## 2013-10-19 MED ORDER — HEPARIN SOD (PORK) LOCK FLUSH 100 UNIT/ML IV SOLN
500.0000 [IU] | Freq: Once | INTRAVENOUS | Status: AC | PRN
  Administered 2013-10-19: 500 [IU]
  Filled 2013-10-19: qty 5

## 2013-10-19 MED ORDER — ACETAMINOPHEN 325 MG PO TABS
ORAL_TABLET | ORAL | Status: AC
  Filled 2013-10-19: qty 2

## 2013-10-19 MED ORDER — SODIUM CHLORIDE 0.9 % IJ SOLN
10.0000 mL | INTRAMUSCULAR | Status: DC | PRN
  Administered 2013-10-19: 10 mL
  Filled 2013-10-19: qty 10

## 2013-10-19 MED ORDER — SODIUM CHLORIDE 0.9 % IV SOLN
Freq: Once | INTRAVENOUS | Status: AC
  Administered 2013-10-19: 13:00:00 via INTRAVENOUS

## 2013-10-19 NOTE — Addendum Note (Signed)
Encounter addended by: Arlyss Repress, RN on: 10/19/2013  3:10 PM<BR>     Documentation filed: Inpatient MAR

## 2013-10-19 NOTE — Addendum Note (Signed)
Encounter addended by: Arlyss Repress, RN on: 10/19/2013  3:05 PM<BR>     Documentation filed: Orders

## 2013-10-19 NOTE — Progress Notes (Signed)
Weekly Management Note Current Dose: 59  Gy  Projected Dose: 61 Gy   Narrative:  The patient presents for routine under treatment assessment.  CBCT/MVCT images/Port film x-rays were reviewed.  The chart was checked. Doing well. Minimal fatigue and minimal skin irritation. Has been taking antiestrogen without problems. Appt with med onc on 9/9.  Physical Findings: Weight: 167 lb 3.2 oz (75.841 kg). Unchanged. Slightly pink left breast  Impression:  The patient is tolerating radiation.  Plan:  Continue treatment as planned. Follow up 1 month. Start lotion with vitamin e. After 2 weeks. Call sooner as needed.

## 2013-10-19 NOTE — Progress Notes (Signed)
Sabrina [name] has received 32 fractions to her right breast.  Note erythema of the breast in the areola area with small  Of redness in the inframmary fold.  Skin remains intact.  Denies any pain, but admits to fatigue if she stands too long.

## 2013-10-19 NOTE — Patient Instructions (Signed)
[facility] Discharge Instructions for Patients Receiving Chemotherapy  Today you received the following chemotherapy agents Herceptin.  To help prevent nausea and vomiting after your treatment, we encourage you to take your nausea medication as directed.   If you develop nausea and vomiting that is not controlled by your nausea medication, call the clinic.   BELOW ARE SYMPTOMS THAT SHOULD BE REPORTED IMMEDIATELY:  *FEVER GREATER THAN 100.5 F  *CHILLS WITH OR WITHOUT FEVER  NAUSEA AND VOMITING THAT IS NOT CONTROLLED WITH YOUR NAUSEA MEDICATION  *UNUSUAL SHORTNESS OF BREATH  *UNUSUAL BRUISING OR BLEEDING  TENDERNESS IN MOUTH AND THROAT WITH OR WITHOUT PRESENCE OF ULCERS  *URINARY PROBLEMS  *BOWEL PROBLEMS  UNUSUAL RASH Items with * indicate a potential emergency and should be followed up as soon as possible.  Feel free to call the clinic you have any questions or concerns. The clinic phone number is [phone].  

## 2013-10-20 ENCOUNTER — Encounter: Payer: Self-pay | Admitting: Radiation Oncology

## 2013-10-20 ENCOUNTER — Ambulatory Visit
Admission: RE | Admit: 2013-10-20 | Discharge: 2013-10-20 | Disposition: A | Discharge status: Home / Self Care | Payer: Medicare Other | Source: Ambulatory Visit | Attending: Radiation Oncology | Admitting: Radiation Oncology

## 2013-10-20 DIAGNOSIS — Z510 Encounter for antineoplastic radiation therapy: Secondary | ICD-10-CM | POA: Diagnosis not present

## 2013-10-20 NOTE — Progress Notes (Signed)
  Radiation Oncology         [phone] ________________________________  Name: Sabrina [name] MRN: [id number]  Date: 10/20/2013  DOB: 12/19/1926  End of Treatment Note  Diagnosis:   T1N1 Right Breast Cancer     Indication for treatment:  Curative       Radiation treatment dates:   09/06/2013-10/20/2013  Site/dose:    Right breast / 21 Gray @ 1.8 [place] Cables per fraction x 25 fractions Right supraclavicular fossa / 45 Gy @1 .8 Gy per fraction x 25 fractions Right PAB/ 45 Gy at 1.8 Gy per fraction x 25 fractions Right breast boost / 16 Gray at [employer] per fraction x 8 fractions  Beams/energy:  Opposed Tangents / 10 MV photons LAO / 10 MV photons RPO/ 6 MV photons En face/ 15 MeV electrons  Narrative: The patient tolerated radiation treatment relatively well.   She had actually minimal skin toxicity. She was hospitalized for hyperglycemia during her treatment.   Plan: The patient has completed radiation treatment. The patient will return to radiation oncology clinic for routine followup in one month. I advised them to call or return sooner if they have any questions or concerns related to their recovery or treatment.  ------------------------------------------------  Thea Silversmith, MD

## 2013-10-25 NOTE — Progress Notes (Signed)
Name: Sabrina [name]   MRN: [id number]  Date:  10/06/13   DOB: March 19, 1926  Status:outpatient    DIAGNOSIS: Right breast cancer  CONSENT VERIFIED: yes   SET UP: Patient is setup supine   IMMOBILIZATION:  The following immobilization was used:Custom Moldable Pillow, breast board.   NARRATIVE: [name] underwent complex simulation and treatment planning for her boost treatment today.  Her tumor volume was outlined on the planning CT scan. The depth of her cavity was felt to be appropriate for treatment with electrons    15  MeV electrons will be prescribed to the 100% isodose line.   I personally oversaw and approved the construction of a unique block which will be used for beam modification purposes.  A special port plan is requested.

## 2013-11-09 ENCOUNTER — Other Ambulatory Visit (HOSPITAL_BASED_OUTPATIENT_CLINIC_OR_DEPARTMENT_OTHER): Payer: Medicare Other

## 2013-11-09 ENCOUNTER — Ambulatory Visit (HOSPITAL_BASED_OUTPATIENT_CLINIC_OR_DEPARTMENT_OTHER): Payer: Medicare Other

## 2013-11-09 ENCOUNTER — Other Ambulatory Visit: Payer: Self-pay | Admitting: *Deleted

## 2013-11-09 ENCOUNTER — Other Ambulatory Visit: Payer: Self-pay | Admitting: Oncology

## 2013-11-09 VITALS — BP 144/48 | HR 65 | Temp 97.6°F

## 2013-11-09 DIAGNOSIS — C50419 Malignant neoplasm of upper-outer quadrant of unspecified female breast: Secondary | ICD-10-CM

## 2013-11-09 DIAGNOSIS — C50411 Malignant neoplasm of upper-outer quadrant of right female breast: Secondary | ICD-10-CM

## 2013-11-09 DIAGNOSIS — Z5112 Encounter for antineoplastic immunotherapy: Secondary | ICD-10-CM

## 2013-11-09 LAB — CBC WITH DIFFERENTIAL/PLATELET
BASO%: 0.8 % (ref 0.0–2.0)
Basophils Absolute: 0 10*3/uL (ref 0.0–0.1)
EOS%: 4.5 % (ref 0.0–7.0)
Eosinophils Absolute: 0.2 10*3/uL (ref 0.0–0.5)
HCT: 32.4 % — ABNORMAL LOW (ref 34.8–46.6)
HGB: 10.5 g/dL — ABNORMAL LOW (ref 11.6–15.9)
LYMPH%: 18.4 % (ref 14.0–49.7)
MCH: 28.9 pg (ref 25.1–34.0)
MCHC: 32.3 g/dL (ref 31.5–36.0)
MCV: 89.3 fL (ref 79.5–101.0)
MONO#: 0.3 10*3/uL (ref 0.1–0.9)
MONO%: 9 % (ref 0.0–14.0)
NEUT#: 2.6 10*3/uL (ref 1.5–6.5)
NEUT%: 67.3 % (ref 38.4–76.8)
Platelets: 219 10*3/uL (ref 145–400)
RBC: 3.62 10*6/uL — ABNORMAL LOW (ref 3.70–5.45)
RDW: 15.2 % — ABNORMAL HIGH (ref 11.2–14.5)
WBC: 3.9 10*3/uL (ref 3.9–10.3)
lymph#: 0.7 10*3/uL — ABNORMAL LOW (ref 0.9–3.3)

## 2013-11-09 LAB — COMPREHENSIVE METABOLIC PANEL (CC13)
ALT: 14 U/L (ref 0–55)
AST: 17 U/L (ref 5–34)
Albumin: 3.5 g/dL (ref 3.5–5.0)
Alkaline Phosphatase: 93 U/L (ref 40–150)
Anion Gap: 9 mEq/L (ref 3–11)
BUN: 59 mg/dL — ABNORMAL HIGH (ref 7.0–26.0)
CO2: 28 mEq/L (ref 22–29)
Calcium: 9.6 mg/dL (ref 8.4–10.4)
Chloride: 104 mEq/L (ref 98–109)
Creatinine: 2 mg/dL — ABNORMAL HIGH (ref 0.6–1.1)
Glucose: 247 mg/dl — ABNORMAL HIGH (ref 70–140)
Potassium: 4.2 mEq/L (ref 3.5–5.1)
Sodium: 141 mEq/L (ref 136–145)
Total Bilirubin: 0.55 mg/dL (ref 0.20–1.20)
Total Protein: 6.7 g/dL (ref 6.4–8.3)

## 2013-11-09 MED ORDER — PROCHLORPERAZINE MALEATE 10 MG PO TABS: 10.0000 mg | ORAL_TABLET | Freq: Once | ORAL | Status: DC

## 2013-11-09 MED ORDER — PROCHLORPERAZINE EDISYLATE 5 MG/ML IJ SOLN: 10.0000 mg | Freq: Once | INTRAMUSCULAR | Status: DC

## 2013-11-09 MED ORDER — PROCHLORPERAZINE EDISYLATE 5 MG/ML IJ SOLN: 10.0000 mg | Freq: Four times a day (QID) | INTRAMUSCULAR | Status: DC | PRN

## 2013-11-09 MED ORDER — SODIUM CHLORIDE 0.9 % IJ SOLN
10.0000 mL | INTRAMUSCULAR | Status: DC | PRN
  Administered 2013-11-09: 10 mL
  Filled 2013-11-09: qty 10

## 2013-11-09 MED ORDER — SODIUM CHLORIDE 0.9 % IV SOLN
Freq: Once | INTRAVENOUS | Status: AC
  Administered 2013-11-09: 14:00:00 via INTRAVENOUS

## 2013-11-09 MED ORDER — ACETAMINOPHEN 325 MG PO TABS
ORAL_TABLET | ORAL | Status: AC
  Filled 2013-11-09: qty 2

## 2013-11-09 MED ORDER — DIPHENHYDRAMINE HCL 25 MG PO CAPS
25.0000 mg | ORAL_CAPSULE | Freq: Once | ORAL | Status: AC
  Administered 2013-11-09: 25 mg via ORAL

## 2013-11-09 MED ORDER — ACETAMINOPHEN 325 MG PO TABS
650.0000 mg | ORAL_TABLET | Freq: Once | ORAL | Status: AC
  Administered 2013-11-09: 650 mg via ORAL

## 2013-11-09 MED ORDER — DIPHENHYDRAMINE HCL 25 MG PO CAPS
ORAL_CAPSULE | ORAL | Status: AC
  Filled 2013-11-09: qty 1

## 2013-11-09 MED ORDER — HEPARIN SOD (PORK) LOCK FLUSH 100 UNIT/ML IV SOLN
500.0000 [IU] | Freq: Once | INTRAVENOUS | Status: AC | PRN
  Administered 2013-11-09: 500 [IU]
  Filled 2013-11-09: qty 5

## 2013-11-09 MED ORDER — TRASTUZUMAB CHEMO INJECTION 440 MG
6.0000 mg/kg | Freq: Once | INTRAVENOUS | Status: AC
  Administered 2013-11-09: 462 mg via INTRAVENOUS
  Filled 2013-11-09: qty 22

## 2013-11-09 NOTE — Patient Instructions (Signed)
[facility] Discharge Instructions for Patients Receiving Chemotherapy  Today you received the following chemotherapy agents: Herceptin.  To help prevent nausea and vomiting after your treatment, we encourage you to take your nausea medication: Compazine 10mg  every 6 hours as needed.   If you develop nausea and vomiting that is not controlled by your nausea medication, call the clinic.   BELOW ARE SYMPTOMS THAT SHOULD BE REPORTED IMMEDIATELY:  *FEVER GREATER THAN 100.5 F  *CHILLS WITH OR WITHOUT FEVER  NAUSEA AND VOMITING THAT IS NOT CONTROLLED WITH YOUR NAUSEA MEDICATION  *UNUSUAL SHORTNESS OF BREATH  *UNUSUAL BRUISING OR BLEEDING  TENDERNESS IN MOUTH AND THROAT WITH OR WITHOUT PRESENCE OF ULCERS  *URINARY PROBLEMS  *BOWEL PROBLEMS  UNUSUAL RASH Items with * indicate a potential emergency and should be followed up as soon as possible.  Feel free to call the clinic you have any questions or concerns. The clinic phone number is [phone].

## 2013-11-18 ENCOUNTER — Ambulatory Visit
Admission: RE | Admit: 2013-11-18 | Discharge: 2013-11-18 | Disposition: A | Discharge status: Home / Self Care | Payer: Medicare Other | Source: Ambulatory Visit | Attending: Radiation Oncology | Admitting: Radiation Oncology

## 2013-11-18 VITALS — BP 135/55 | HR 85 | Temp 97.4°F | Wt 161.0 lb

## 2013-11-18 DIAGNOSIS — C50411 Malignant neoplasm of upper-outer quadrant of right female breast: Secondary | ICD-10-CM

## 2013-11-18 NOTE — Progress Notes (Signed)
Department of Radiation Oncology  Phone:  [phone] Fax:        [phone]   Name: Sabrina [name] MRN: [id number]  DOB: 11/19/1926  Date: 11/18/2013  Follow Up Visit Note  Diagnosis: T1N1 Right breast cancer  Summary and Interval since last radiation: 61 Gy to the right breast completed 10/20/13  Interval History: Sabrina [name] presents today for routine followup.  She is under stress as her husband is in the hospital and not doing well. Her skin has healed up well except for a red rash under her right breast that is itchy.  She is accompanied by a good friend.   Allergies: No Known Allergies  Medications:  Current Outpatient Prescriptions  Medication Sig Dispense Refill  . anastrozole (ARIMIDEX) 1 MG tablet Take 1 tablet (1 mg total) by mouth daily.  30 tablet  3  . atorvastatin (LIPITOR) 10 MG tablet Take 10 mg by mouth daily.      . calcium citrate (CALCITRATE - DOSED IN MG ELEMENTAL CALCIUM) 950 MG tablet Take 200 mg of elemental calcium by mouth daily.      . calcium-vitamin D (OSCAL WITH D) 250-125 MG-UNIT per tablet Take 1 tablet by mouth 2 (two) times daily.      . cholecalciferol (VITAMIN D) 1000 UNITS tablet Take 1,000 Units by mouth daily.      . cloNIDine (CATAPRES) 0.2 MG tablet Take 0.2 mg by mouth 2 (two) times daily.      . furosemide (LASIX) 40 MG tablet Take 1 tablet (40 mg total) by mouth daily. Take extra tab as needed for swelling  60 tablet  6  . glipiZIDE (GLUCOTROL) 2.5 mg TABS tablet Take 0.5 tablets (2.5 mg total) by mouth 2 (two) times daily before a meal.  60 tablet  1  . glucose blood test strip Use as instructed  100 each  12  . glucose monitoring kit (FREESTYLE) monitoring kit 1 each by Does not apply route as needed for other.  1 each  1  . IRON PO Take 65 mg by mouth daily.      [name] lidocaine-prilocaine (EMLA) cream Apply 1 - 2 hours to port -a-cath before being accessed.  30 g  6  . losartan (COZAAR) 25 MG tablet       . potassium chloride SA  (K-DUR,KLOR-CON) 20 MEQ tablet TAKE 1 TABLET ONCE DAILY.  30 tablet  6  . traMADol (ULTRAM) 50 MG tablet Take 1 tablet (50 mg total) by mouth 2 (two) times daily.  60 tablet  1  . verapamil (VERELAN PM) 240 MG 24 hr capsule Take 240 mg by mouth daily.       [name] warfarin (COUMADIN) 3 MG tablet Take 3 mg by mouth daily. Take 23m tablets for 2 days then take two 135mtablets (73m80m on the third day       No current facility-administered medications for this encounter.    Physical Exam:  Filed Vitals:   11/18/13 0840  BP: 135/55  Pulse: 85  Temp: 97.4 F (36.3 C)  Weight: 161 lb (73.029 kg)   rash under right breast consistent with yeast. Skin helaed well with no hyperpigmentation.   IMPRESSION: Nupur is a [age]. female s/p breast conservation with resolving acute effects fo treatment.   PLAN:  She is doing well. We discussed the need for follow up every 4-6 months which she has scheduled.  We discussed the need for yearly mammograms which she can schedule with  her OBGYN or with medical oncology. We discussed the need for sun protection in the treated area.  She can always call me with questions.  I will follow up with her on an as needed basis. I will ask social work to follow up with her in regards to support with her husband.     Thea Silversmith, MD

## 2013-11-18 NOTE — Progress Notes (Signed)
Patient for routine one month follow up of radiation to the right breast.Skin has healed with minimum discoloration but does have an itching red rash under right mammary fold.Some weight loss.Patient's husband is now having rehab as she is unable to take care of all of his needs.

## 2013-11-22 ENCOUNTER — Ambulatory Visit (INDEPENDENT_AMBULATORY_CARE_PROVIDER_SITE_OTHER): Payer: Medicare Other | Admitting: General Surgery

## 2013-11-23 ENCOUNTER — Ambulatory Visit (HOSPITAL_COMMUNITY)
Admission: RE | Admit: 2013-11-23 | Discharge: 2013-11-23 | Disposition: A | Discharge status: Home / Self Care | Payer: Medicare Other | Source: Ambulatory Visit | Attending: Geriatric Medicine | Admitting: Geriatric Medicine

## 2013-11-23 ENCOUNTER — Encounter: Payer: Self-pay | Admitting: *Deleted

## 2013-11-23 ENCOUNTER — Other Ambulatory Visit (HOSPITAL_COMMUNITY): Payer: Self-pay | Admitting: Oncology

## 2013-11-23 ENCOUNTER — Encounter: Payer: Self-pay | Admitting: Internal Medicine

## 2013-11-23 DIAGNOSIS — I059 Rheumatic mitral valve disease, unspecified: Secondary | ICD-10-CM | POA: Insufficient documentation

## 2013-11-23 DIAGNOSIS — I079 Rheumatic tricuspid valve disease, unspecified: Secondary | ICD-10-CM | POA: Diagnosis not present

## 2013-11-23 DIAGNOSIS — C50419 Malignant neoplasm of upper-outer quadrant of unspecified female breast: Secondary | ICD-10-CM | POA: Diagnosis not present

## 2013-11-23 DIAGNOSIS — R0609 Other forms of dyspnea: Secondary | ICD-10-CM | POA: Insufficient documentation

## 2013-11-23 DIAGNOSIS — R0989 Other specified symptoms and signs involving the circulatory and respiratory systems: Secondary | ICD-10-CM | POA: Diagnosis present

## 2013-11-23 DIAGNOSIS — D0590 Unspecified type of carcinoma in situ of unspecified breast: Secondary | ICD-10-CM

## 2013-11-23 DIAGNOSIS — C50411 Malignant neoplasm of upper-outer quadrant of right female breast: Secondary | ICD-10-CM

## 2013-11-23 DIAGNOSIS — I369 Nonrheumatic tricuspid valve disorder, unspecified: Secondary | ICD-10-CM

## 2013-11-23 NOTE — Progress Notes (Signed)
  Echocardiogram 2D Echocardiogram has been performed.  Mayuri Staples FRANCES 11/23/2013, 3:34 PM

## 2013-11-23 NOTE — Progress Notes (Signed)
[place] Work  Clinical Social Work was referred by Pension scheme manager for emotional support and assessment of psychosocial needs.  Clinical Social Worker contacted patient at home to offer support and assess for needs.  Patient stated her husband was currently in rehab at [place] [facility] and was improving.  Patient also stated she had started driving again and was feeling more confident and comfortable driving to her appointments.  CSW and patient discussed how she was doing emotionally, and patient acknowledge feeling overwhelmed.  CSW provided patient with a space to express and process her emotions.  Patient became tearful at times when discussing her feelings.  Patient stated "I am doing ok, but recently when people ask me how I am doing I start crying."  CSW validated and normalized patients feelings and discussed counseling and support services at [facility].  Patient stated she would "think about it", and identified her sister and friends as support.  CSW offered additional support and encouraged patient to call with any needs or concerns.           Clinical Social Work interventions: Engineer, [employer] Education  Johnnye Lana, MSW, LCSW, OSW-C Clinical Social Worker [place] [phone]

## 2013-11-25 ENCOUNTER — Encounter (HOSPITAL_COMMUNITY): Payer: Self-pay

## 2013-11-25 ENCOUNTER — Ambulatory Visit (HOSPITAL_COMMUNITY)
Admission: RE | Admit: 2013-11-25 | Discharge: 2013-11-25 | Disposition: A | Discharge status: Home / Self Care | Payer: Medicare Other | Source: Ambulatory Visit | Attending: Internal Medicine | Admitting: Internal Medicine

## 2013-11-25 VITALS — BP 144/60 | HR 84 | Ht 59.0 in | Wt 159.8 lb

## 2013-11-25 DIAGNOSIS — E119 Type 2 diabetes mellitus without complications: Secondary | ICD-10-CM | POA: Insufficient documentation

## 2013-11-25 DIAGNOSIS — I359 Nonrheumatic aortic valve disorder, unspecified: Secondary | ICD-10-CM | POA: Insufficient documentation

## 2013-11-25 DIAGNOSIS — N182 Chronic kidney disease, stage 2 (mild): Secondary | ICD-10-CM | POA: Diagnosis not present

## 2013-11-25 DIAGNOSIS — Z923 Personal history of irradiation: Secondary | ICD-10-CM | POA: Diagnosis not present

## 2013-11-25 DIAGNOSIS — R600 Localized edema: Secondary | ICD-10-CM

## 2013-11-25 DIAGNOSIS — I129 Hypertensive chronic kidney disease with stage 1 through stage 4 chronic kidney disease, or unspecified chronic kidney disease: Secondary | ICD-10-CM | POA: Insufficient documentation

## 2013-11-25 DIAGNOSIS — I2699 Other pulmonary embolism without acute cor pulmonale: Secondary | ICD-10-CM | POA: Diagnosis not present

## 2013-11-25 DIAGNOSIS — Z7901 Long term (current) use of anticoagulants: Secondary | ICD-10-CM | POA: Insufficient documentation

## 2013-11-25 DIAGNOSIS — R609 Edema, unspecified: Secondary | ICD-10-CM | POA: Diagnosis not present

## 2013-11-25 DIAGNOSIS — C50419 Malignant neoplasm of upper-outer quadrant of unspecified female breast: Secondary | ICD-10-CM | POA: Diagnosis present

## 2013-11-25 DIAGNOSIS — C50411 Malignant neoplasm of upper-outer quadrant of right female breast: Secondary | ICD-10-CM

## 2013-11-25 DIAGNOSIS — I82409 Acute embolism and thrombosis of unspecified deep veins of unspecified lower extremity: Secondary | ICD-10-CM | POA: Diagnosis not present

## 2013-11-25 MED ORDER — POTASSIUM CHLORIDE CRYS ER 20 MEQ PO TBCR: EXTENDED_RELEASE_TABLET | ORAL | Status: DC

## 2013-11-25 NOTE — Patient Instructions (Signed)
Doing well.   Take extra 20 mg of lasix today and tomorrow. When you take extra lasix take an extra 20 meq of potassium.  Follow up in 3 months with ECHO.  Do the following things EVERYDAY: 1) Weigh yourself in the morning before breakfast. Write it down and keep it in a log. 2) Take your medicines as prescribed 3) Eat low salt foods-Limit salt (sodium) to 2000 mg per day.  4) Stay as active as you can everyday 5) Limit all fluids for the day to less than 2 liters 6)

## 2013-11-25 NOTE — Progress Notes (Signed)
Patient ID: Sabrina [name], female   DOB: Dec 16, 1926, 80 y.o.   MRN: [id number]  PCP: Dr Sabrina [name] Oncologist: Dr. Chancy [name]  HPI: Sabrina [name] is an 80 year old with a history of DM, HTN, S/P R breast lumpectomy and porta cath. Stage II (T1 N1) invasive ductal carcinoma,grade 3 ER PR positive HER-2/neu positive.    Plan to Taxol on a weekly basis for total of 12 weeks with Herceptin given weekly as well. We would then change the Herceptin to every 3 week to complete total of one year of therapy.Plan to start March   Echo 04/21/13; EF 60-65% Grade I DD. Significant MAC. Mild aortic stenosis. Lateral s' 8.6 GLS -15%          08/11/13: EF 60% Grade I DD Significant MAC. Mild aortic stenosis. Lateral s' 10.1 GLS -21.7%          11/25/13: EF 55-60%, grade I DD, significant MAC, mild AS, Lateral s' 10.3, GLS -20.1%, RV mildly HK  Follow up for Breast Cancer: She is done with radiation and continues with Herceptin q weeks. Remains fatigued. Denies SOB, orthopnea, PND or CP. Taking lasix daily now.   FH: No family history of heart disease.  SH: Lives at home with husband. Nonsmoker.   ROS: All systems negative except as listed in HPI, PMH and Problem List.  Past Medical History  Diagnosis Date  . Heart murmur     no known problems; states did not know she had murmur until age 64  . Immature cataract   . Arthritis     neck  . Wears partial dentures     lower  . Dental crowns present   . Hypertension     fluctuates, especially when stressed; has been on med. > 20 yr.  . Non-insulin dependent type 2 diabetes mellitus   . High cholesterol   . Chronic kidney disease (CKD), stage III (moderate)     nephrologist, Dr. Corliss [name]  . Breast cancer 03/2013    right  . Radiation 09/06/13-10/20/13    Right Breast Cancer    Current Outpatient Prescriptions  Medication Sig Dispense Refill  . anastrozole (ARIMIDEX) 1 MG tablet Take 1 tablet (1 mg total) by mouth daily.  30 tablet  3  . atorvastatin  (LIPITOR) 10 MG tablet Take 10 mg by mouth daily.      . calcium citrate (CALCITRATE - DOSED IN MG ELEMENTAL CALCIUM) 950 MG tablet Take 200 mg of elemental calcium by mouth daily.      . calcium-vitamin D (OSCAL WITH D) 250-125 MG-UNIT per tablet Take 1 tablet by mouth 2 (two) times daily.      . cholecalciferol (VITAMIN D) 1000 UNITS tablet Take 1,000 Units by mouth daily.      . cloNIDine (CATAPRES) 0.2 MG tablet Take 0.2 mg by mouth 2 (two) times daily.      . furosemide (LASIX) 40 MG tablet Take 1 tablet (40 mg total) by mouth daily. Take extra tab as needed for swelling  60 tablet  6  . glipiZIDE (GLUCOTROL) 2.5 mg TABS tablet Take 0.5 tablets (2.5 mg total) by mouth 2 (two) times daily before a meal.  60 tablet  1  . glucose blood test strip Use as instructed  100 each  12  . glucose monitoring kit (FREESTYLE) monitoring kit 1 each by Does not apply route as needed for other.  1 each  1  . IRON PO Take 65 mg by mouth daily.      [name]  lidocaine-prilocaine (EMLA) cream Apply 1 - 2 hours to port -a-cath before being accessed.  30 g  6  . losartan (COZAAR) 25 MG tablet       . potassium chloride SA (K-DUR,KLOR-CON) 20 MEQ tablet TAKE 1 TABLET ONCE DAILY.  30 tablet  6  . traMADol (ULTRAM) 50 MG tablet Take 1 tablet (50 mg total) by mouth 2 (two) times daily.  60 tablet  1  . verapamil (VERELAN PM) 240 MG 24 hr capsule Take 240 mg by mouth daily.       [name] warfarin (COUMADIN) 3 MG tablet Take 3 mg by mouth daily. Take 80m tablets for 2 days then take two 151mtablets (55m67m on the third day       No current facility-administered medications for this encounter.    Filed Vitals:   11/25/13 1452  BP: 144/60  Pulse: 84  Height: 4' 11"  (1.499 m)  Weight: 159 lb 12.8 oz (72.485 kg)  SpO2: 97%   PHYSICAL EXAM: General:  Elderly. Walks with cane. No resp difficulty HEENT: normal. alopecic Neck: supple. JVP 10. Carotids 2+ bilaterally; no bruits. No lymphadenopathy or thryomegaly appreciated. Cor:  PMI normal. Regular rate & rhythm. Soft AS murmur Lungs: clear Abdomen: obese. soft, nontender, mildly distended. No hepatosplenomegaly. No bruits or masses. Good bowel sounds. Extremities: no cyanosis, clubbing, rash, tr edema Neuro: alert & orientedx3, cranial nerves grossly intact. Moves all 4 extremities w/o difficulty. Affect pleasant.  ASSESSMENT & PLAN:  1. Breast cancer -  - Dr. BenHaroldine Lawsviewed ECHO from today and EF stable and lateral s' and GS stable. Will continue Herceptin treatment. Will need repeat ECHO in 3 months. 2. HTN - BP labile. Can increase clonidine as needed or given DM2 could consider ACE-I or ARB (previously had cough with ACE)  3. LE edema - Volume status looks elevated. Encouraged her to take extra lasix as needed for several days to keep fluid down. Watch salty foods. Take Kcl 20 with each dose of lasix.  4. Aortic stenosis - mild. Continue to follow.  5. PE/DVT - on coumadin   F/U 3 months CosRande BruntP-C 2:55 PM  Patient seen and examined with AliJunie BameP. We discussed all aspects of the encounter. I agree with the assessment and plan as stated above.   I reviewed echos personally. EF and Doppler parameters stable. No HF on exam. Continue Herceptin. Encouraged her to take extra lasix.  DanQuillian Quincensimhon,MD 4:49 PM

## 2013-11-29 ENCOUNTER — Other Ambulatory Visit: Payer: Self-pay | Admitting: *Deleted

## 2013-11-29 DIAGNOSIS — C50411 Malignant neoplasm of upper-outer quadrant of right female breast: Secondary | ICD-10-CM

## 2013-11-30 ENCOUNTER — Other Ambulatory Visit (HOSPITAL_BASED_OUTPATIENT_CLINIC_OR_DEPARTMENT_OTHER): Payer: Medicare Other

## 2013-11-30 ENCOUNTER — Ambulatory Visit (HOSPITAL_BASED_OUTPATIENT_CLINIC_OR_DEPARTMENT_OTHER): Payer: Medicare Other | Admitting: Oncology

## 2013-11-30 ENCOUNTER — Other Ambulatory Visit: Payer: Self-pay | Admitting: *Deleted

## 2013-11-30 ENCOUNTER — Telehealth: Payer: Self-pay | Admitting: Oncology

## 2013-11-30 ENCOUNTER — Ambulatory Visit (HOSPITAL_BASED_OUTPATIENT_CLINIC_OR_DEPARTMENT_OTHER): Payer: Medicare Other

## 2013-11-30 VITALS — BP 137/53 | HR 89 | Temp 98.3°F | Resp 18 | Ht 59.0 in | Wt 159.4 lb

## 2013-11-30 DIAGNOSIS — M949 Disorder of cartilage, unspecified: Secondary | ICD-10-CM

## 2013-11-30 DIAGNOSIS — C50419 Malignant neoplasm of upper-outer quadrant of unspecified female breast: Secondary | ICD-10-CM

## 2013-11-30 DIAGNOSIS — C50411 Malignant neoplasm of upper-outer quadrant of right female breast: Secondary | ICD-10-CM

## 2013-11-30 DIAGNOSIS — Z170 Estrogen receptor positive status [ER+]: Secondary | ICD-10-CM

## 2013-11-30 DIAGNOSIS — M8580 Other specified disorders of bone density and structure, unspecified site: Secondary | ICD-10-CM | POA: Insufficient documentation

## 2013-11-30 DIAGNOSIS — M899 Disorder of bone, unspecified: Secondary | ICD-10-CM

## 2013-11-30 DIAGNOSIS — Z5112 Encounter for antineoplastic immunotherapy: Secondary | ICD-10-CM

## 2013-11-30 DIAGNOSIS — Z86718 Personal history of other venous thrombosis and embolism: Secondary | ICD-10-CM

## 2013-11-30 DIAGNOSIS — Z86711 Personal history of pulmonary embolism: Secondary | ICD-10-CM

## 2013-11-30 DIAGNOSIS — C773 Secondary and unspecified malignant neoplasm of axilla and upper limb lymph nodes: Secondary | ICD-10-CM

## 2013-11-30 LAB — COMPREHENSIVE METABOLIC PANEL (CC13)
ALT: 12 U/L (ref 0–55)
AST: 14 U/L (ref 5–34)
Albumin: 3.3 g/dL — ABNORMAL LOW (ref 3.5–5.0)
Alkaline Phosphatase: 102 U/L (ref 40–150)
Anion Gap: 8 mEq/L (ref 3–11)
BUN: 64.5 mg/dL — ABNORMAL HIGH (ref 7.0–26.0)
CO2: 30 mEq/L — ABNORMAL HIGH (ref 22–29)
Calcium: 9.7 mg/dL (ref 8.4–10.4)
Chloride: 102 mEq/L (ref 98–109)
Creatinine: 2.2 mg/dL — ABNORMAL HIGH (ref 0.6–1.1)
Glucose: 278 mg/dl — ABNORMAL HIGH (ref 70–140)
Potassium: 4.4 mEq/L (ref 3.5–5.1)
Sodium: 140 mEq/L (ref 136–145)
Total Bilirubin: 0.64 mg/dL (ref 0.20–1.20)
Total Protein: 6.8 g/dL (ref 6.4–8.3)

## 2013-11-30 LAB — CBC WITH DIFFERENTIAL/PLATELET
BASO%: 0.6 % (ref 0.0–2.0)
Basophils Absolute: 0 10*3/uL (ref 0.0–0.1)
EOS%: 3.1 % (ref 0.0–7.0)
Eosinophils Absolute: 0.2 10*3/uL (ref 0.0–0.5)
HCT: 30.9 % — ABNORMAL LOW (ref 34.8–46.6)
HGB: 9.9 g/dL — ABNORMAL LOW (ref 11.6–15.9)
LYMPH%: 16.6 % (ref 14.0–49.7)
MCH: 29 pg (ref 25.1–34.0)
MCHC: 32.1 g/dL (ref 31.5–36.0)
MCV: 90.2 fL (ref 79.5–101.0)
MONO#: 0.6 10*3/uL (ref 0.1–0.9)
MONO%: 9.5 % (ref 0.0–14.0)
NEUT#: 4.2 10*3/uL (ref 1.5–6.5)
NEUT%: 70.2 % (ref 38.4–76.8)
Platelets: 186 10*3/uL (ref 145–400)
RBC: 3.43 10*6/uL — ABNORMAL LOW (ref 3.70–5.45)
RDW: 15 % — ABNORMAL HIGH (ref 11.2–14.5)
WBC: 6 10*3/uL (ref 3.9–10.3)
lymph#: 1 10*3/uL (ref 0.9–3.3)

## 2013-11-30 MED ORDER — PROCHLORPERAZINE EDISYLATE 5 MG/ML IJ SOLN
10.0000 mg | Freq: Four times a day (QID) | INTRAMUSCULAR | Status: DC | PRN
  Administered 2013-11-30: 10 mg via INTRAVENOUS

## 2013-11-30 MED ORDER — HEPARIN SOD (PORK) LOCK FLUSH 100 UNIT/ML IV SOLN
500.0000 [IU] | Freq: Once | INTRAVENOUS | Status: AC | PRN
  Administered 2013-11-30: 500 [IU]
  Filled 2013-11-30: qty 5

## 2013-11-30 MED ORDER — DIPHENHYDRAMINE HCL 25 MG PO CAPS
ORAL_CAPSULE | ORAL | Status: AC
  Filled 2013-11-30: qty 1

## 2013-11-30 MED ORDER — PROCHLORPERAZINE EDISYLATE 5 MG/ML IJ SOLN
INTRAMUSCULAR | Status: AC
  Filled 2013-11-30: qty 2

## 2013-11-30 MED ORDER — TRASTUZUMAB CHEMO INJECTION 440 MG
6.0000 mg/kg | Freq: Once | INTRAVENOUS | Status: AC
  Administered 2013-11-30: 462 mg via INTRAVENOUS
  Filled 2013-11-30: qty 22

## 2013-11-30 MED ORDER — ACETAMINOPHEN 325 MG PO TABS
650.0000 mg | ORAL_TABLET | Freq: Once | ORAL | Status: AC
  Administered 2013-11-30: 650 mg via ORAL

## 2013-11-30 MED ORDER — SODIUM CHLORIDE 0.9 % IJ SOLN
10.0000 mL | INTRAMUSCULAR | Status: DC | PRN
  Administered 2013-11-30: 10 mL
  Filled 2013-11-30: qty 10

## 2013-11-30 MED ORDER — SODIUM CHLORIDE 0.9 % IV SOLN
Freq: Once | INTRAVENOUS | Status: AC
  Administered 2013-11-30: 10:00:00 via INTRAVENOUS

## 2013-11-30 MED ORDER — ACETAMINOPHEN 325 MG PO TABS
ORAL_TABLET | ORAL | Status: AC
  Filled 2013-11-30: qty 2

## 2013-11-30 NOTE — Progress Notes (Signed)
[facility]  [phone] [phone]     ID: Sabrina [name] DOB: Jul 13, 1926  MR#: [id number]  VHQ#:[id number]  PCP: [name], MD GYN: SU: Rolm Bookbinder OTHER MD: Thea Silversmith  CHIEF COMPLAINT: Estrogen receptor positive breast cancer  CURRENT TREATMENT: Anastrozole   BREAST CANCER HISTORY: From Dr. Bernell List Khan's intake note 03/24/2013:  "Sabrina [name] is a 80 y.o. female. Who underwent a screening mammogram. She was found to have a right breast mass measuring 1 cm. There were no abnormalities on her physical exam. Patient had ultrasound performed that showed 1.3 x 1.2 x 1.0 cm hypoechoic mass in the upper outer quadrant of the right breast. In the right axilla there was an abnormal appearing lymph node measuring 1.65 1.4 x 0.8 cm. Both her biopsy. The lymph node was positive for metastatic carcinoma. The primary breast tumor biopsy showed invasive ductal carcinoma, grade 2, ER positive PR positive HER-2/neu equivocal with a proliferation marker Ki-67 15%.."  On 03/30/2013 the patient underwent right lumpectomy in right axillary lymph node sampling. The pathology ([id number]) showed a 1.4 cm invasive ductal carcinoma grade 3, present at the lateral and posterior margins. 3 axillary lymph nodes (non-sentinel lymph nodes) were sampled, one of which had a micrometastatic tumor deposits with extracapsular extension. Repeat HER-2 was positive, with a signals ratio of 2.54 and the number per cell of 3.55.  The patient was started on weekly Taxol and trastuzumab, but tolerated treatment poorly and develop bilateral DVTs and pulmonary embolism April 2015. At that time her chemotherapy was interrupted.  Her subsequent history is as detailed below.   INTERVAL HISTORY: Sabrina [name] returns today for followup of her breast cancer accompanied . Since her last visit here she completed her radiation treatments. She did "very well", although one she completed the  radiation she did develop a fungal rash on her skin which was treated successfully with miconazole. She has very good things to say about the staff in the radiation department  REVIEW OF SYSTEMS: She is tolerating anastrozole with no side effects that she is aware of. She has a little bit of a running nose right now. Occasionally her ankles swell. Of course she bruises easily, on warfarin, but she has had no overt bleeding complications. A detailed review of systems today was otherwise stable   PAST MEDICAL HISTORY: Past Medical History  Diagnosis Date  . Heart murmur     no known problems; states did not know she had murmur until age 17  . Immature cataract   . Arthritis     neck  . Wears partial dentures     lower  . Dental crowns present   . Hypertension     fluctuates, especially when stressed; has been on med. > 20 yr.  . Non-insulin dependent type 2 diabetes mellitus   . High cholesterol   . Chronic kidney disease (CKD), stage III (moderate)     nephrologist, Dr. [name]  . Breast cancer 03/2013    right  . Radiation 09/06/13-10/20/13    Right Breast Cancer    PAST SURGICAL HISTORY: Past Surgical History  Procedure Laterality Date  . Tonsillectomy      as a child  . Breast surgery Right 11/1958    right breast biopsy benign  . Dilation and curettage of uterus    . Breast lumpectomy with needle localization Right 03/30/2013    Procedure: BREAST LUMPECTOMY WITH NEEDLE LOCALIZATION;  Surgeon: Rolm Bookbinder, MD;  Location: [facility]  OR;  Service: General;  Laterality: Right;  . Axillary lymph node dissection Right 03/30/2013    Procedure: AXILLARY LYMPH NODE DISSECTION;  Surgeon: Rolm Bookbinder, MD;  Location: [facility];  Service: General;  Laterality: Right;  . Re-excision of breast cancer,superior margins Right 04/15/2013    Procedure: RE-EXCISION OF RIGHT BREAST  MARGINS;  Surgeon: Rolm Bookbinder, MD;  Location: [facility];  Service: General;   Laterality: Right;  . Portacath placement N/A 04/15/2013    Procedure: INSERTION PORT-A-CATH;  Surgeon: Rolm Bookbinder, MD;  Location: [facility];  Service: General;  Laterality: N/A;    FAMILY HISTORY Family History  Problem Relation Age of Onset  . Pneumonia Mother   . Heart attack Father   . Breast cancer Other 21    niece  . Breast cancer Other 9    niece   the patient's father died in his 73s, in the setting of multiple medical problems. The patient's mother died in her 87s from pneumonia. The patient had 4 sisters, one of whom has died from complications of diabetes. She had a brother who died at 84 months. There is no history of breast cancer in the immediate family although the patient does have 2 nieces with breast cancer. The patient has been tested for the BRCA mutations and her genetics was normal  GYNECOLOGIC HISTORY:  No LMP recorded. Patient is postmenopausal. Menarche age 76, the patient is GX P0. She went through menopause in her 71s, took hormone replacement until she was in her 40s.   SOCIAL HISTORY:  Lynniah worked as the [employer] and locally. She and her husband Sabrina [name] greatly enjoyed [employer] [place]. Sabrina [name] worked for the daily [employer] in [place].    ADVANCED DIRECTIVES: In place   HEALTH MAINTENANCE: History  Substance Use Topics  . Smoking status: Never Smoker   . Smokeless tobacco: Never Used     Comment: only smoked 2 packs cigarettes total  . Alcohol Use: 0.0 oz/week     Comment: 2-3 glasses wine/week     Colonoscopy:  PAP:  Bone density:  Lipid panel:  No Known Allergies  Current Outpatient Prescriptions  Medication Sig Dispense Refill  . anastrozole (ARIMIDEX) 1 MG tablet Take 1 tablet (1 mg total) by mouth daily.  30 tablet  3  . atorvastatin (LIPITOR) 10 MG tablet Take 10 mg by mouth daily.      . calcium citrate (CALCITRATE - DOSED IN MG ELEMENTAL CALCIUM) 950 MG tablet Take 200 mg of elemental calcium by mouth daily.       . calcium-vitamin D (OSCAL WITH D) 250-125 MG-UNIT per tablet Take 1 tablet by mouth 2 (two) times daily.      . cholecalciferol (VITAMIN D) 1000 UNITS tablet Take 1,000 Units by mouth daily.      . cloNIDine (CATAPRES) 0.2 MG tablet Take 0.2 mg by mouth 2 (two) times daily.      . furosemide (LASIX) 40 MG tablet Take 1 tablet (40 mg total) by mouth daily. Take extra tab as needed for swelling  60 tablet  6  . glipiZIDE (GLUCOTROL) 2.5 mg TABS tablet Take 0.5 tablets (2.5 mg total) by mouth 2 (two) times daily before a meal.  60 tablet  1  . glucose blood test strip Use as instructed  100 each  12  . glucose monitoring kit (FREESTYLE) monitoring kit 1 each by Does not apply route as needed for other.  1 each  1  .  IRON PO Take 65 mg by mouth daily.      [name] lidocaine-prilocaine (EMLA) cream Apply 1 - 2 hours to port -a-cath before being accessed.  30 g  6  . losartan (COZAAR) 25 MG tablet       . potassium chloride SA (K-DUR,KLOR-CON) 20 MEQ tablet TAKE 1 TABLET ONCE DAILY.  30 tablet  6  . traMADol (ULTRAM) 50 MG tablet Take 1 tablet (50 mg total) by mouth 2 (two) times daily.  60 tablet  1  . verapamil (VERELAN PM) 240 MG 24 hr capsule Take 240 mg by mouth daily.       [name] warfarin (COUMADIN) 3 MG tablet Take 3 mg by mouth daily. Take 67m tablets for 2 days then take two 168mtablets (58m76m on the third day       No current facility-administered medications for this visit.    OBJECTIVE: Elderly white woman who appears stated age  Fil71tals:   11/30/13 0934  BP: 137/53  Pulse: 89  Temp: 98.3 F (36.8 C)  Resp: 18     Body mass index is 32.18 kg/(m^2).    ECOG FS:1 - Symptomatic but completely ambulatory  Ocular: Sclerae unicteric,  pupils round and equal  Ear-nose-throat: Oropharynx clear, teeth in good repair  Lymphatic: No cervical or supraclavicular adenopathy Lungs no rales or rhonchi, no rubs Heart regular rate and rhythm Abd soft,  obese, nontender, positive bowel  sounds MSK no focal spinal tenderness, no upper extremity lymphedema Neuro: non-focal, well-oriented,  positive  affect Breasts: Deferred   LAB RESULTS:  CMP     Component Value Date/Time   NA 141 11/09/2013 1258   NA 142 09/14/2013 0515   K 4.2 11/09/2013 1258   K 4.5 09/14/2013 0515   CL 102 09/14/2013 0515   CO2 28 11/09/2013 1258   CO2 30 09/14/2013 0515   GLUCOSE 247* 11/09/2013 1258   GLUCOSE 224* 09/14/2013 0515   BUN 59.0* 11/09/2013 1258   BUN 41* 09/14/2013 0515   CREATININE 2.0* 11/09/2013 1258   CREATININE 1.66* 09/14/2013 0515   CALCIUM 9.6 11/09/2013 1258   CALCIUM 9.2 09/14/2013 0515   PROT 6.7 11/09/2013 1258   PROT 5.9* 09/14/2013 0515   ALBUMIN 3.5 11/09/2013 1258   ALBUMIN 3.3* 09/14/2013 0515   AST 17 11/09/2013 1258   AST 10 09/14/2013 0515   ALT 14 11/09/2013 1258   ALT 13 09/14/2013 0515   ALKPHOS 93 11/09/2013 1258   ALKPHOS 72 09/14/2013 0515   BILITOT 0.55 11/09/2013 1258   BILITOT 0.7 09/14/2013 0515   GFRNONAA 27* 09/14/2013 0515   GFRAA 32* 09/14/2013 0515    I No results found for this basename: SPEP,  UPEP,   kappa and lambda light chains    Lab Results  Component Value Date   WBC 6.0 11/30/2013   NEUTROABS 4.2 11/30/2013   HGB 9.9* 11/30/2013   HCT 30.9* 11/30/2013   MCV 90.2 11/30/2013   PLT 186 11/30/2013      Chemistry      Component Value Date/Time   NA 141 11/09/2013 1258   NA 142 09/14/2013 0515   K 4.2 11/09/2013 1258   K 4.5 09/14/2013 0515   CL 102 09/14/2013 0515   CO2 28 11/09/2013 1258   CO2 30 09/14/2013 0515   BUN 59.0* 11/09/2013 1258   BUN 41* 09/14/2013 0515   CREATININE 2.0* 11/09/2013 1258   CREATININE 1.66* 09/14/2013 0515      Component  Value Date/Time   CALCIUM 9.6 11/09/2013 1258   CALCIUM 9.2 09/14/2013 0515   ALKPHOS 93 11/09/2013 1258   ALKPHOS 72 09/14/2013 0515   AST 17 11/09/2013 1258   AST 10 09/14/2013 0515   ALT 14 11/09/2013 1258   ALT 13 09/14/2013 0515   BILITOT 0.55 11/09/2013 1258   BILITOT 0.7 09/14/2013 0515       No results found for this  basename: LABCA2    No components found with this basename: LABCA125    No results found for this basename: INR,  in the last 168 hours  Urinalysis    Component Value Date/Time   COLORURINE YELLOW 09/13/2013 [place] 09/13/2013 0523   LABSPEC 1.010 09/13/2013 0523   PHURINE 7.0 09/13/2013 0523   GLUCOSEU 100* 09/13/2013 0523   HGBUR NEGATIVE 09/13/2013 0523   BILIRUBINUR NEGATIVE 09/13/2013 0523   KETONESUR NEGATIVE 09/13/2013 0523   PROTEINUR NEGATIVE 09/13/2013 0523   UROBILINOGEN 0.2 09/13/2013 0523   NITRITE NEGATIVE 09/13/2013 0523   LEUKOCYTESUR MODERATE* 09/13/2013 0523    STUDIES: ------------------------------------------------------------------- Transthoracic Echocardiography  Patient: Sabrina [name], Alatorre MR #: [id number] Study Date: 11/23/2013 Gender: F Age: 89 Height: 147.3 cm Weight: 73 kg BSA: 1.76 m^2 Pt. Status: Room:  ATTENDING [name] [id number] JSHFWYOVZ, [place] ORDERING Skyelyn Scruggs, Virgie Dad REFERRING Bertha Lokken, Virgie Dad SONOGRAPHER Alyse Low Little, RCS PERFORMING [facility], Outpatient  cc:  ------------------------------------------------------------------- LV EF: 58%     ASSESSMENT: 80 y.o. [place] woman status post right lumpectomy and axillary lymph node sampling 03/30/2013 for a pT1c pN1a, stage II 8 invasive ductal carcinoma, grade 3, estrogen receptor 100% positive, progesterone receptor 100% positive, with an MIB-1 of 15%, and HER-2 amplified, with a signals ratio of 2.54 and the number per cell being 3.55.  (a) margins were positive but clear with additional surgery to 04/15/2013 [phone])  (1) adjuvant chemotherapy /immunotherapy with paclitaxel and trastuzumab weekly started 05/10/2013, discontinued after 2 doses because of poor tolerance  (2) ventilation/perfusion scan 06/10/2013 documented right lower lobe pulmonary emboli; Doppler ultrasound 06/11/2013 documented bilateral lower extremity DVTs; started on warfarin  managed through Dr. Carlyle Lipa office  (3) anastrozole started June 2015;   (4) adjuvant radiation completed 10/20/2013  (5) Trastuzumab resumed 09/07/2013,to be continued at least through March 2016; most recent echocardiogram 11/23/2013 showed a well preserved ejection fraction  (6) genetics testing April 2015 was normal and did not reveal a mutation in any of these genes: APC, ATM, AXIN2, BARD1, BMPRIA, BRCA1, BRCA2, BRIP1, CDH1, CDK4, CDKN2A, CHEK2, EPCAM, FANCC, MLH1, MSH2, MSH6, MUTYH, NBN, PALB2, PMS2, PTEN, RAD51C, SMAD4, STK11, TP53, VHL, and XRCC2  (7) osteopenia: DEXA scan 08/17/2013 showed a T score of -2.1-- to start Prolia (denosumab)  PLAN: Ruie is done with a heart part of her treatment. We are continuing the trastuzumab through March of this coming year as she is tolerating that well. She will have her next echo December 22.  She is doing fine with the anastrozole. She does have baseline osteopenia. We discussed denosumab today and she understands the risk of hypocalcemia and also concerns regarding osteonecrosis of the jaw. I think the latter would be very rare in her case since her teeth are in good repair. We will start Prolia as soon we can get it precertified.  Otherwise she will return to see me December 1. The plan is to continue anastrozole for total of 5 years. She knows to call for any problems that may develop before her next visit  here. Chauncey Cruel, MD   11/30/2013 9:38 AM

## 2013-11-30 NOTE — Telephone Encounter (Signed)
per pof to sch pt appt-sent MW email to sch trmt-will contact pt once reply

## 2013-11-30 NOTE — Patient Instructions (Signed)
[facility] Discharge Instructions for Patients Receiving Chemotherapy  Today you received the following chemotherapy agents: Herceptin  To help prevent nausea and vomiting after your treatment, we encourage you to take your nausea medication as prescribed by your physician.    If you develop nausea and vomiting that is not controlled by your nausea medication, call the clinic.   BELOW ARE SYMPTOMS THAT SHOULD BE REPORTED IMMEDIATELY:  *FEVER GREATER THAN 100.5 F  *CHILLS WITH OR WITHOUT FEVER  NAUSEA AND VOMITING THAT IS NOT CONTROLLED WITH YOUR NAUSEA MEDICATION  *UNUSUAL SHORTNESS OF BREATH  *UNUSUAL BRUISING OR BLEEDING  TENDERNESS IN MOUTH AND THROAT WITH OR WITHOUT PRESENCE OF ULCERS  *URINARY PROBLEMS  *BOWEL PROBLEMS  UNUSUAL RASH Items with * indicate a potential emergency and should be followed up as soon as possible.  Feel free to call the clinic you have any questions or concerns. The clinic phone number is [phone].    

## 2013-12-01 ENCOUNTER — Telehealth: Payer: Self-pay | Admitting: *Deleted

## 2013-12-01 NOTE — Telephone Encounter (Signed)
Per staff message and POF I have scheduled appts. Advised scheduler of appts. [name]  

## 2013-12-03 ENCOUNTER — Telehealth: Payer: Self-pay | Admitting: Oncology

## 2013-12-03 NOTE — Telephone Encounter (Signed)
cld & left pt message for appt time on on 10/20

## 2013-12-08 ENCOUNTER — Other Ambulatory Visit: Payer: Self-pay | Admitting: Adult Health

## 2013-12-08 DIAGNOSIS — C50919 Malignant neoplasm of unspecified site of unspecified female breast: Secondary | ICD-10-CM

## 2013-12-20 ENCOUNTER — Other Ambulatory Visit: Payer: Self-pay | Admitting: *Deleted

## 2013-12-20 DIAGNOSIS — C50411 Malignant neoplasm of upper-outer quadrant of right female breast: Secondary | ICD-10-CM

## 2013-12-21 ENCOUNTER — Ambulatory Visit (HOSPITAL_BASED_OUTPATIENT_CLINIC_OR_DEPARTMENT_OTHER): Payer: Medicare Other

## 2013-12-21 ENCOUNTER — Other Ambulatory Visit (HOSPITAL_BASED_OUTPATIENT_CLINIC_OR_DEPARTMENT_OTHER): Payer: Medicare Other

## 2013-12-21 ENCOUNTER — Other Ambulatory Visit: Payer: Medicare Other

## 2013-12-21 VITALS — BP 115/53 | HR 66 | Temp 97.3°F | Resp 18

## 2013-12-21 DIAGNOSIS — C50411 Malignant neoplasm of upper-outer quadrant of right female breast: Secondary | ICD-10-CM

## 2013-12-21 DIAGNOSIS — Z5112 Encounter for antineoplastic immunotherapy: Secondary | ICD-10-CM

## 2013-12-21 LAB — CBC WITH DIFFERENTIAL/PLATELET
BASO%: 0.6 % (ref 0.0–2.0)
Basophils Absolute: 0 10*3/uL (ref 0.0–0.1)
EOS%: 2.8 % (ref 0.0–7.0)
Eosinophils Absolute: 0.2 10*3/uL (ref 0.0–0.5)
HCT: 30.7 % — ABNORMAL LOW (ref 34.8–46.6)
HGB: 9.9 g/dL — ABNORMAL LOW (ref 11.6–15.9)
LYMPH%: 12.6 % — ABNORMAL LOW (ref 14.0–49.7)
MCH: 29.1 pg (ref 25.1–34.0)
MCHC: 32.4 g/dL (ref 31.5–36.0)
MCV: 89.9 fL (ref 79.5–101.0)
MONO#: 0.4 10*3/uL (ref 0.1–0.9)
MONO%: 7.3 % (ref 0.0–14.0)
NEUT#: 4.3 10*3/uL (ref 1.5–6.5)
NEUT%: 76.7 % (ref 38.4–76.8)
Platelets: 216 10*3/uL (ref 145–400)
RBC: 3.42 10*6/uL — ABNORMAL LOW (ref 3.70–5.45)
RDW: 15.1 % — ABNORMAL HIGH (ref 11.2–14.5)
WBC: 5.7 10*3/uL (ref 3.9–10.3)
lymph#: 0.7 10*3/uL — ABNORMAL LOW (ref 0.9–3.3)

## 2013-12-21 LAB — COMPREHENSIVE METABOLIC PANEL (CC13)
ALT: 13 U/L (ref 0–55)
AST: 14 U/L (ref 5–34)
Albumin: 3.4 g/dL — ABNORMAL LOW (ref 3.5–5.0)
Alkaline Phosphatase: 86 U/L (ref 40–150)
Anion Gap: 11 mEq/L (ref 3–11)
BUN: 55.6 mg/dL — ABNORMAL HIGH (ref 7.0–26.0)
CO2: 26 mEq/L (ref 22–29)
Calcium: 9.6 mg/dL (ref 8.4–10.4)
Chloride: 105 mEq/L (ref 98–109)
Creatinine: 2.2 mg/dL — ABNORMAL HIGH (ref 0.6–1.1)
Glucose: 290 mg/dl — ABNORMAL HIGH (ref 70–140)
Potassium: 4.1 mEq/L (ref 3.5–5.1)
Sodium: 141 mEq/L (ref 136–145)
Total Bilirubin: 0.52 mg/dL (ref 0.20–1.20)
Total Protein: 6.6 g/dL (ref 6.4–8.3)

## 2013-12-21 MED ORDER — HEPARIN SOD (PORK) LOCK FLUSH 100 UNIT/ML IV SOLN
500.0000 [IU] | Freq: Once | INTRAVENOUS | Status: AC | PRN
  Administered 2013-12-21: 500 [IU]
  Filled 2013-12-21: qty 5

## 2013-12-21 MED ORDER — SODIUM CHLORIDE 0.9 % IJ SOLN
10.0000 mL | INTRAMUSCULAR | Status: DC | PRN
  Administered 2013-12-21: 10 mL
  Filled 2013-12-21: qty 10

## 2013-12-21 MED ORDER — PROCHLORPERAZINE EDISYLATE 5 MG/ML IJ SOLN
10.0000 mg | Freq: Four times a day (QID) | INTRAMUSCULAR | Status: DC | PRN
  Administered 2013-12-21: 10 mg via INTRAVENOUS

## 2013-12-21 MED ORDER — TRASTUZUMAB CHEMO INJECTION 440 MG
6.0000 mg/kg | Freq: Once | INTRAVENOUS | Status: AC
  Administered 2013-12-21: 462 mg via INTRAVENOUS
  Filled 2013-12-21: qty 22

## 2013-12-21 MED ORDER — SODIUM CHLORIDE 0.9 % IV SOLN
Freq: Once | INTRAVENOUS | Status: AC
  Administered 2013-12-21: 10:00:00 via INTRAVENOUS

## 2013-12-21 MED ORDER — ACETAMINOPHEN 325 MG PO TABS
650.0000 mg | ORAL_TABLET | Freq: Once | ORAL | Status: AC
  Administered 2013-12-21: 650 mg via ORAL

## 2013-12-21 MED ORDER — ACETAMINOPHEN 325 MG PO TABS
ORAL_TABLET | ORAL | Status: AC
  Filled 2013-12-21: qty 2

## 2013-12-21 MED ORDER — PROCHLORPERAZINE EDISYLATE 5 MG/ML IJ SOLN
INTRAMUSCULAR | Status: AC
  Filled 2013-12-21: qty 2

## 2013-12-21 NOTE — Patient Instructions (Signed)

## 2013-12-21 NOTE — Progress Notes (Signed)
Per Dr.Magrinat, ok to treat with creatine of 2.2

## 2014-01-03 ENCOUNTER — Encounter (HOSPITAL_COMMUNITY): Payer: Self-pay

## 2014-01-10 ENCOUNTER — Other Ambulatory Visit: Payer: Self-pay | Admitting: Emergency Medicine

## 2014-01-10 DIAGNOSIS — C50411 Malignant neoplasm of upper-outer quadrant of right female breast: Secondary | ICD-10-CM

## 2014-01-11 ENCOUNTER — Other Ambulatory Visit (HOSPITAL_BASED_OUTPATIENT_CLINIC_OR_DEPARTMENT_OTHER): Payer: Medicare Other

## 2014-01-11 ENCOUNTER — Ambulatory Visit (HOSPITAL_BASED_OUTPATIENT_CLINIC_OR_DEPARTMENT_OTHER): Payer: Medicare Other

## 2014-01-11 ENCOUNTER — Other Ambulatory Visit: Payer: Self-pay | Admitting: Oncology

## 2014-01-11 DIAGNOSIS — C50411 Malignant neoplasm of upper-outer quadrant of right female breast: Secondary | ICD-10-CM

## 2014-01-11 DIAGNOSIS — Z5112 Encounter for antineoplastic immunotherapy: Secondary | ICD-10-CM

## 2014-01-11 DIAGNOSIS — M8580 Other specified disorders of bone density and structure, unspecified site: Secondary | ICD-10-CM

## 2014-01-11 LAB — COMPREHENSIVE METABOLIC PANEL (CC13)
ALT: 12 U/L (ref 0–55)
AST: 14 U/L (ref 5–34)
Albumin: 3.5 g/dL (ref 3.5–5.0)
Alkaline Phosphatase: 90 U/L (ref 40–150)
Anion Gap: 8 mEq/L (ref 3–11)
BUN: 71.6 mg/dL — ABNORMAL HIGH (ref 7.0–26.0)
CO2: 29 mEq/L (ref 22–29)
Calcium: 9.5 mg/dL (ref 8.4–10.4)
Chloride: 105 mEq/L (ref 98–109)
Creatinine: 2.2 mg/dL — ABNORMAL HIGH (ref 0.6–1.1)
Glucose: 214 mg/dl — ABNORMAL HIGH (ref 70–140)
Potassium: 4.7 mEq/L (ref 3.5–5.1)
Sodium: 143 mEq/L (ref 136–145)
Total Bilirubin: 0.49 mg/dL (ref 0.20–1.20)
Total Protein: 6.6 g/dL (ref 6.4–8.3)

## 2014-01-11 LAB — CBC WITH DIFFERENTIAL/PLATELET
BASO%: 0.8 % (ref 0.0–2.0)
Basophils Absolute: 0.1 10*3/uL (ref 0.0–0.1)
EOS%: 3.4 % (ref 0.0–7.0)
Eosinophils Absolute: 0.2 10*3/uL (ref 0.0–0.5)
HCT: 30.6 % — ABNORMAL LOW (ref 34.8–46.6)
HGB: 10 g/dL — ABNORMAL LOW (ref 11.6–15.9)
LYMPH%: 13.3 % — ABNORMAL LOW (ref 14.0–49.7)
MCH: 29.6 pg (ref 25.1–34.0)
MCHC: 32.7 g/dL (ref 31.5–36.0)
MCV: 90.5 fL (ref 79.5–101.0)
MONO#: 0.6 10*3/uL (ref 0.1–0.9)
MONO%: 8.1 % (ref 0.0–14.0)
NEUT#: 5.3 10*3/uL (ref 1.5–6.5)
NEUT%: 74.4 % (ref 38.4–76.8)
Platelets: 202 10*3/uL (ref 145–400)
RBC: 3.38 10*6/uL — ABNORMAL LOW (ref 3.70–5.45)
RDW: 14.7 % — ABNORMAL HIGH (ref 11.2–14.5)
WBC: 7.1 10*3/uL (ref 3.9–10.3)
lymph#: 0.9 10*3/uL (ref 0.9–3.3)

## 2014-01-11 MED ORDER — PROCHLORPERAZINE EDISYLATE 5 MG/ML IJ SOLN
INTRAMUSCULAR | Status: AC
  Filled 2014-01-11: qty 2

## 2014-01-11 MED ORDER — SODIUM CHLORIDE 0.9 % IJ SOLN
10.0000 mL | INTRAMUSCULAR | Status: DC | PRN
  Administered 2014-01-11: 10 mL
  Filled 2014-01-11: qty 10

## 2014-01-11 MED ORDER — ACETAMINOPHEN 325 MG PO TABS
ORAL_TABLET | ORAL | Status: AC
  Filled 2014-01-11: qty 2

## 2014-01-11 MED ORDER — SODIUM CHLORIDE 0.9 % IV SOLN
Freq: Once | INTRAVENOUS | Status: AC
  Administered 2014-01-11: 11:00:00 via INTRAVENOUS

## 2014-01-11 MED ORDER — TRASTUZUMAB CHEMO INJECTION 440 MG
6.0000 mg/kg | Freq: Once | INTRAVENOUS | Status: AC
  Administered 2014-01-11: 462 mg via INTRAVENOUS
  Filled 2014-01-11: qty 22

## 2014-01-11 MED ORDER — ACETAMINOPHEN 325 MG PO TABS
650.0000 mg | ORAL_TABLET | Freq: Once | ORAL | Status: AC
  Administered 2014-01-11: 650 mg via ORAL

## 2014-01-11 MED ORDER — PROCHLORPERAZINE EDISYLATE 5 MG/ML IJ SOLN
10.0000 mg | Freq: Once | INTRAMUSCULAR | Status: AC
  Administered 2014-01-11: 10 mg via INTRAVENOUS

## 2014-01-11 MED ORDER — HEPARIN SOD (PORK) LOCK FLUSH 100 UNIT/ML IV SOLN
500.0000 [IU] | Freq: Once | INTRAVENOUS | Status: AC | PRN
  Administered 2014-01-11: 500 [IU]
  Filled 2014-01-11: qty 5

## 2014-01-11 MED ORDER — DENOSUMAB 60 MG/ML ~~LOC~~ SOLN
60.0000 mg | Freq: Once | SUBCUTANEOUS | Status: AC
  Administered 2014-01-11: 60 mg via SUBCUTANEOUS
  Filled 2014-01-11: qty 1

## 2014-01-11 NOTE — Patient Instructions (Addendum)
Trastuzumab injection for infusion What is this medicine? TRASTUZUMAB (tras TOO zoo mab) is a monoclonal antibody. It targets a protein called HER2. This protein is found in some stomach and breast cancers. This medicine can stop cancer cell growth. This medicine may be used with other cancer treatments. This medicine may be used for other purposes; ask your health care provider or pharmacist if you have questions. COMMON BRAND NAME(S): Herceptin What should I tell my health care provider before I take this medicine? They need to know if you have any of these conditions: -heart disease -heart failure -infection (especially a virus infection such as chickenpox, cold sores, or herpes) -lung or breathing disease, like asthma -recent or ongoing radiation therapy -an unusual or allergic reaction to trastuzumab, benzyl alcohol, or other medications, foods, dyes, or preservatives -pregnant or trying to get pregnant -breast-feeding How should I use this medicine? This drug is given as an infusion into a vein. It is administered in a hospital or clinic by a specially trained health care professional. Talk to your pediatrician regarding the use of this medicine in children. This medicine is not approved for use in children. Overdosage: If you think you have taken too much of this medicine contact a poison control center or emergency room at once. NOTE: This medicine is only for you. Do not share this medicine with others. What if I miss a dose? It is important not to miss a dose. Call your doctor or health care professional if you are unable to keep an appointment. What may interact with this medicine? -cyclophosphamide -doxorubicin -warfarin This list may not describe all possible interactions. Give your health care provider a list of all the medicines, herbs, non-prescription drugs, or dietary supplements you use. Also tell them if you smoke, drink alcohol, or use illegal drugs. Some items may  interact with your medicine. What should I watch for while using this medicine? Visit your doctor for checks on your progress. Report any side effects. Continue your course of treatment even though you feel ill unless your doctor tells you to stop. Call your doctor or health care professional for advice if you get a fever, chills or sore throat, or other symptoms of a cold or flu. Do not treat yourself. Try to avoid being around people who are sick. You may experience fever, chills and shaking during your first infusion. These effects are usually mild and can be treated with other medicines. Report any side effects during the infusion to your health care professional. Fever and chills usually do not happen with later infusions. What side effects may I notice from receiving this medicine? Side effects that you should report to your doctor or other health care professional as soon as possible: -breathing difficulties -chest pain or palpitations -cough -dizziness or fainting -fever or chills, sore throat -skin rash, itching or hives -swelling of the legs or ankles -unusually weak or tired Side effects that usually do not require medical attention (report to your doctor or other health care professional if they continue or are bothersome): -loss of appetite -headache -muscle aches -nausea This list may not describe all possible side effects. Call your doctor for medical advice about side effects. You may report side effects to FDA at 1-800-FDA-1088. Where should I keep my medicine? This drug is given in a hospital or clinic and will not be stored at home. NOTE: This sheet is a summary. It may not cover all possible information. If you have questions about this medicine, talk   to your doctor, pharmacist, or health care provider.  2015, Elsevier/Gold Standard. (2008-12-23 13:43:15) Denosumab injection What is this medicine? DENOSUMAB (den oh sue mab) slows bone breakdown. Prolia is used to treat  osteoporosis in women after menopause and in men. Delton See is used to prevent bone fractures and other bone problems caused by cancer bone metastases. Delton See is also used to treat giant cell tumor of the bone. This medicine may be used for other purposes; ask your health care provider or pharmacist if you have questions. COMMON BRAND NAME(S): Prolia, XGEVA What should I tell my health care provider before I take this medicine? They need to know if you have any of these conditions: -dental disease -eczema -infection or history of infections -kidney disease or on dialysis -low blood calcium or vitamin D -malabsorption syndrome -scheduled to have surgery or tooth extraction -taking medicine that contains denosumab -thyroid or parathyroid disease -an unusual reaction to denosumab, other medicines, foods, dyes, or preservatives -pregnant or trying to get pregnant -breast-feeding How should I use this medicine? This medicine is for injection under the skin. It is given by a health care professional in a hospital or clinic setting. If you are getting Prolia, a special MedGuide will be given to you by the pharmacist with each prescription and refill. Be sure to read this information carefully each time. For Prolia, talk to your pediatrician regarding the use of this medicine in children. Special care may be needed. For Delton See, talk to your pediatrician regarding the use of this medicine in children. While this drug may be prescribed for children as young as 13 years for selected conditions, precautions do apply. Overdosage: If you think you've taken too much of this medicine contact a poison control center or emergency room at once. Overdosage: If you think you have taken too much of this medicine contact a poison control center or emergency room at once. NOTE: This medicine is only for you. Do not share this medicine with others. What if I miss a dose? It is important not to miss your dose. Call your  doctor or health care professional if you are unable to keep an appointment. What may interact with this medicine? Do not take this medicine with any of the following medications: -other medicines containing denosumab This medicine may also interact with the following medications: -medicines that suppress the immune system -medicines that treat cancer -steroid medicines like prednisone or cortisone This list may not describe all possible interactions. Give your health care provider a list of all the medicines, herbs, non-prescription drugs, or dietary supplements you use. Also tell them if you smoke, drink alcohol, or use illegal drugs. Some items may interact with your medicine. What should I watch for while using this medicine? Visit your doctor or health care professional for regular checks on your progress. Your doctor or health care professional may order blood tests and other tests to see how you are doing. Call your doctor or health care professional if you get a cold or other infection while receiving this medicine. Do not treat yourself. This medicine may decrease your body's ability to fight infection. You should make sure you get enough calcium and vitamin D while you are taking this medicine, unless your doctor tells you not to. Discuss the foods you eat and the vitamins you take with your health care professional. See your dentist regularly. Brush and floss your teeth as directed. Before you have any dental work done, tell your dentist you are receiving this  medicine. Do not become pregnant while taking this medicine or for 5 months after stopping it. Women should inform their doctor if they wish to become pregnant or think they might be pregnant. There is a potential for serious side effects to an unborn child. Talk to your health care professional or pharmacist for more information. What side effects may I notice from receiving this medicine? Side effects that you should report to your  doctor or health care professional as soon as possible: -allergic reactions like skin rash, itching or hives, swelling of the face, lips, or tongue -breathing problems -chest pain -fast, irregular heartbeat -feeling faint or lightheaded, falls -fever, chills, or any other sign of infection -muscle spasms, tightening, or twitches -numbness or tingling -skin blisters or bumps, or is dry, peels, or red -slow healing or unexplained pain in the mouth or jaw -unusual bleeding or bruising Side effects that usually do not require medical attention (Report these to your doctor or health care professional if they continue or are bothersome.): -muscle pain -stomach upset, gas This list may not describe all possible side effects. Call your doctor for medical advice about side effects. You may report side effects to FDA at 1-800-FDA-1088. Where should I keep my medicine? This medicine is only given in a clinic, doctor's office, or other health care setting and will not be stored at home. NOTE: This sheet is a summary. It may not cover all possible information. If you have questions about this medicine, talk to your doctor, pharmacist, or health care provider.  2015, Elsevier/Gold Standard. (2011-08-19 12:37:47)  

## 2014-01-13 DIAGNOSIS — B372 Candidiasis of skin and nail: Secondary | ICD-10-CM | POA: Insufficient documentation

## 2014-01-31 ENCOUNTER — Other Ambulatory Visit: Payer: Self-pay | Admitting: *Deleted

## 2014-01-31 DIAGNOSIS — C50411 Malignant neoplasm of upper-outer quadrant of right female breast: Secondary | ICD-10-CM

## 2014-02-01 ENCOUNTER — Telehealth: Payer: Self-pay | Admitting: *Deleted

## 2014-02-01 ENCOUNTER — Telehealth: Payer: Self-pay | Admitting: Oncology

## 2014-02-01 ENCOUNTER — Ambulatory Visit (HOSPITAL_BASED_OUTPATIENT_CLINIC_OR_DEPARTMENT_OTHER): Payer: Medicare Other

## 2014-02-01 ENCOUNTER — Ambulatory Visit (HOSPITAL_BASED_OUTPATIENT_CLINIC_OR_DEPARTMENT_OTHER): Payer: Medicare Other | Admitting: Oncology

## 2014-02-01 ENCOUNTER — Other Ambulatory Visit (HOSPITAL_BASED_OUTPATIENT_CLINIC_OR_DEPARTMENT_OTHER): Payer: Medicare Other

## 2014-02-01 VITALS — BP 138/48 | HR 78 | Temp 97.6°F | Resp 18 | Ht 59.0 in | Wt 158.9 lb

## 2014-02-01 DIAGNOSIS — C50811 Malignant neoplasm of overlapping sites of right female breast: Secondary | ICD-10-CM

## 2014-02-01 DIAGNOSIS — C50411 Malignant neoplasm of upper-outer quadrant of right female breast: Secondary | ICD-10-CM

## 2014-02-01 DIAGNOSIS — C773 Secondary and unspecified malignant neoplasm of axilla and upper limb lymph nodes: Secondary | ICD-10-CM

## 2014-02-01 DIAGNOSIS — Z5112 Encounter for antineoplastic immunotherapy: Secondary | ICD-10-CM

## 2014-02-01 DIAGNOSIS — K5900 Constipation, unspecified: Secondary | ICD-10-CM

## 2014-02-01 DIAGNOSIS — Z170 Estrogen receptor positive status [ER+]: Secondary | ICD-10-CM

## 2014-02-01 LAB — COMPREHENSIVE METABOLIC PANEL (CC13)
ALT: 12 U/L (ref 0–55)
AST: 14 U/L (ref 5–34)
Albumin: 3.5 g/dL (ref 3.5–5.0)
Alkaline Phosphatase: 95 U/L (ref 40–150)
Anion Gap: 9 mEq/L (ref 3–11)
BUN: 64.3 mg/dL — ABNORMAL HIGH (ref 7.0–26.0)
CO2: 25 mEq/L (ref 22–29)
Calcium: 8.9 mg/dL (ref 8.4–10.4)
Chloride: 107 mEq/L (ref 98–109)
Creatinine: 2 mg/dL — ABNORMAL HIGH (ref 0.6–1.1)
Glucose: 233 mg/dl — ABNORMAL HIGH (ref 70–140)
Potassium: 4.7 mEq/L (ref 3.5–5.1)
Sodium: 141 mEq/L (ref 136–145)
Total Bilirubin: 0.43 mg/dL (ref 0.20–1.20)
Total Protein: 6.6 g/dL (ref 6.4–8.3)

## 2014-02-01 LAB — CBC WITH DIFFERENTIAL/PLATELET
BASO%: 0.3 % (ref 0.0–2.0)
Basophils Absolute: 0 10*3/uL (ref 0.0–0.1)
EOS%: 2.3 % (ref 0.0–7.0)
Eosinophils Absolute: 0.1 10*3/uL (ref 0.0–0.5)
HCT: 32.1 % — ABNORMAL LOW (ref 34.8–46.6)
HGB: 10.3 g/dL — ABNORMAL LOW (ref 11.6–15.9)
LYMPH%: 17.4 % (ref 14.0–49.7)
MCH: 29.3 pg (ref 25.1–34.0)
MCHC: 32.1 g/dL (ref 31.5–36.0)
MCV: 91.5 fL (ref 79.5–101.0)
MONO#: 0.5 10*3/uL (ref 0.1–0.9)
MONO%: 9 % (ref 0.0–14.0)
NEUT#: 4.1 10*3/uL (ref 1.5–6.5)
NEUT%: 71 % (ref 38.4–76.8)
Platelets: 193 10*3/uL (ref 145–400)
RBC: 3.51 10*6/uL — ABNORMAL LOW (ref 3.70–5.45)
RDW: 14.8 % — ABNORMAL HIGH (ref 11.2–14.5)
WBC: 5.8 10*3/uL (ref 3.9–10.3)
lymph#: 1 10*3/uL (ref 0.9–3.3)

## 2014-02-01 MED ORDER — TRASTUZUMAB CHEMO INJECTION 440 MG
6.0000 mg/kg | Freq: Once | INTRAVENOUS | Status: AC
  Administered 2014-02-01: 462 mg via INTRAVENOUS
  Filled 2014-02-01: qty 22

## 2014-02-01 MED ORDER — SODIUM CHLORIDE 0.9 % IJ SOLN
10.0000 mL | INTRAMUSCULAR | Status: DC | PRN
  Administered 2014-02-01: 10 mL
  Filled 2014-02-01: qty 10

## 2014-02-01 MED ORDER — SODIUM CHLORIDE 0.9 % IV SOLN
Freq: Once | INTRAVENOUS | Status: AC
  Administered 2014-02-01: 10:00:00 via INTRAVENOUS

## 2014-02-01 MED ORDER — ACETAMINOPHEN 325 MG PO TABS
650.0000 mg | ORAL_TABLET | Freq: Once | ORAL | Status: AC
  Administered 2014-02-01: 650 mg via ORAL

## 2014-02-01 MED ORDER — PROCHLORPERAZINE EDISYLATE 5 MG/ML IJ SOLN: 10.0000 mg | Freq: Once | INTRAMUSCULAR | Status: DC

## 2014-02-01 MED ORDER — HEPARIN SOD (PORK) LOCK FLUSH 100 UNIT/ML IV SOLN
500.0000 [IU] | Freq: Once | INTRAVENOUS | Status: AC | PRN
  Administered 2014-02-01: 500 [IU]
  Filled 2014-02-01: qty 5

## 2014-02-01 MED ORDER — ACETAMINOPHEN 325 MG PO TABS
ORAL_TABLET | ORAL | Status: AC
  Filled 2014-02-01: qty 2

## 2014-02-01 NOTE — Progress Notes (Signed)
Sabrina [name]  [phone] [phone]     ID: Sabrina [name] DOB: [birth year]  MR#: [id number]  MHW#:[id number]  PCP: [name], MD GYN: SU: Rolm Bookbinder OTHER MD: Thea Silversmith  CHIEF COMPLAINT: Estrogen receptor positive breast cancer  CURRENT TREATMENT: Anastrozole, trastuzumab   BREAST CANCER HISTORY: From Dr. Bernell List Khan's intake note 03/24/2013:  "Sabrina [name] is a 80 y.o. female. Who underwent a screening mammogram. She was found to have a right breast mass measuring 1 cm. There were no abnormalities on her physical exam. Patient had ultrasound performed that showed 1.3 x 1.2 x 1.0 cm hypoechoic mass in the upper outer quadrant of the right breast. In the right axilla there was an abnormal appearing lymph node measuring 1.65 1.4 x 0.8 cm. Both her biopsy. The lymph node was positive for metastatic carcinoma. The primary breast tumor biopsy showed invasive ductal carcinoma, grade 2, ER positive PR positive HER-2/neu equivocal with a proliferation marker Ki-67 15%.."  On 03/30/2013 the patient underwent right lumpectomy in right axillary lymph node sampling. The pathology ([id number]) showed a 1.4 cm invasive ductal carcinoma grade 3, present at the lateral and posterior margins. 3 axillary lymph nodes (non-sentinel lymph nodes) were sampled, one of which had a micrometastatic tumor deposits with extracapsular extension. Repeat HER-2 was positive, with a signals ratio of 2.54 and the number per cell of 3.55.  The patient was started on weekly Taxol and trastuzumab, but tolerated treatment poorly and develop bilateral DVTs and pulmonary embolism April 2015. At that time her chemotherapy was interrupted.  Her subsequent history is as detailed below.   INTERVAL HISTORY: Sabrina [name] returns today for followup of her breast cancer accompanied by her husband Sabrina [name]. She continues on trastuzumab which she is tolerating "fine". She reports no side  effects so far from the anastrozole. She tolerated her first dose of the news Uma well. She commented that unlike a flu shot it "took a while to get the shots". She had no symptoms suggestive of hypocalcemia.  REVIEW OF SYSTEMS: She is a bit more constipated than usual. She thinks possibly anastrozole might be the cause, but so The iron and the tramadol and the verapamil that she is taking. She has had no bleeding but some bruising related to her Coumadin. She keeps a runny nose. Her dentures don't fit as well as she would like. Occasionally her ankles swell. Sometimes she has discomfort in her surgical arm. She gets short of breath when she walks up stairs, but gets to the top without stopping. She usually uses a cane. A detailed review of systems today was otherwise stable.  PAST MEDICAL HISTORY: Past Medical History  Diagnosis Date  . Heart murmur     no known problems; states did not know she had murmur until age 67  . Immature cataract   . Arthritis     neck  . Wears partial dentures     lower  . Dental crowns present   . Hypertension     fluctuates, especially when stressed; has been on med. > 20 yr.  . Non-insulin dependent type 2 diabetes mellitus   . High cholesterol   . Chronic kidney disease (CKD), stage III (moderate)     nephrologist, Dr. [name]  . Breast cancer 03/2013    right  . Radiation 09/06/13-10/20/13    Right Breast Cancer    PAST SURGICAL HISTORY: Past Surgical History  Procedure Laterality Date  . Tonsillectomy  as a child  . Breast surgery Right 11/1958    right breast biopsy benign  . Dilation and curettage of uterus    . Breast lumpectomy with needle localization Right 03/30/2013    Procedure: BREAST LUMPECTOMY WITH NEEDLE LOCALIZATION;  Surgeon: Rolm Bookbinder, MD;  Location: [place];  Service: General;  Laterality: Right;  . Axillary lymph node dissection Right 03/30/2013    Procedure: AXILLARY LYMPH NODE DISSECTION;  Surgeon: Rolm Bookbinder, MD;  Location: [place];  Service: General;  Laterality: Right;  . Re-excision of breast cancer,superior margins Right 04/15/2013    Procedure: RE-EXCISION OF RIGHT BREAST  MARGINS;  Surgeon: Rolm Bookbinder, MD;  Location: [place];  Service: General;  Laterality: Right;  . Portacath placement N/A 04/15/2013    Procedure: INSERTION PORT-A-CATH;  Surgeon: Rolm Bookbinder, MD;  Location: [place];  Service: General;  Laterality: N/A;    FAMILY HISTORY Family History  Problem Relation Age of Onset  . Pneumonia Mother   . Heart attack Father   . Breast cancer Other 3    niece  . Breast cancer Other 39    niece   the patient's father died in his 58s, in the setting of multiple medical problems. The patient's mother died in her 64s from pneumonia. The patient had 4 sisters, one of whom has died from complications of diabetes. She had a brother who died at 32 months. There is no history of breast cancer in the immediate family although the patient does have 2 nieces with breast cancer. The patient has been tested for the BRCA mutations and her genetics was normal  GYNECOLOGIC HISTORY:  No LMP recorded. Patient is postmenopausal. Menarche age 49, the patient is GX P0. She went through menopause in her 62s, took hormone replacement until she was in her 63s.   SOCIAL HISTORY:  Sabrina [name] worked as the [employer] and locally. She and her husband Sabrina [name] greatly enjoyed [employer] [place]. Sabrina [name] worked for the [employer] in [place].    ADVANCED DIRECTIVES: In place   HEALTH MAINTENANCE: History  Substance Use Topics  . Smoking status: Never Smoker   . Smokeless tobacco: Never Used     Comment: only smoked 2 packs cigarettes total  . Alcohol Use: 0.0 oz/week     Comment: 2-3 glasses wine/week     Colonoscopy:  PAP:  Bone density:  Lipid panel:  No Known Allergies  Current Outpatient Prescriptions  Medication Sig Dispense Refill  .  anastrozole (ARIMIDEX) 1 MG tablet Take 1 tablet (1 mg total) by mouth daily. 30 tablet 3  . atorvastatin (LIPITOR) 10 MG tablet Take 10 mg by mouth daily.    . calcium citrate (CALCITRATE - DOSED IN MG ELEMENTAL CALCIUM) 950 MG tablet Take 200 mg of elemental calcium by mouth daily.    . calcium-vitamin D (OSCAL WITH D) 250-125 MG-UNIT per tablet Take 1 tablet by mouth 2 (two) times daily.    . cholecalciferol (VITAMIN D) 1000 UNITS tablet Take 1,000 Units by mouth daily.    . cloNIDine (CATAPRES) 0.2 MG tablet Take 0.2 mg by mouth 2 (two) times daily.    . furosemide (LASIX) 40 MG tablet Take 1 tablet (40 mg total) by mouth daily. Take extra tab as needed for swelling 60 tablet 6  . glipiZIDE (GLUCOTROL) 2.5 mg TABS tablet Take 0.5 tablets (2.5 mg total) by mouth 2 (two) times daily before a meal. 60 tablet 1  . glucose blood test  strip Use as instructed 100 each 12  . glucose monitoring kit (FREESTYLE) monitoring kit 1 each by Does not apply route as needed for other. 1 each 1  . IRON PO Take 65 mg by mouth daily.    . lidocaine-prilocaine (EMLA) cream Apply 1 - 2 hours to port -a-cath before being accessed. 30 g 6  . losartan (COZAAR) 25 MG tablet     . potassium chloride SA (K-DUR,KLOR-CON) 20 MEQ tablet TAKE 1 TABLET ONCE DAILY. 30 tablet 6  . traMADol (ULTRAM) 50 MG tablet Take 1 tablet (50 mg total) by mouth 2 (two) times daily. 60 tablet 1  . verapamil (VERELAN PM) 240 MG 24 hr capsule Take 240 mg by mouth daily.     . warfarin (COUMADIN) 3 MG tablet Take 3 mg by mouth daily. Take 3mg tablets for 2 days then take two 1mg tablets (2mg)  on the third day     No current facility-administered medications for this visit.    OBJECTIVE: Elderly white [name] In no acute distress  Filed Vitals:   02/01/14 0926  BP: 138/48  Pulse: 78  Temp: 97.6 F (36.4 C)  Resp: 18     Body mass index is 32.08 kg/(m^2).    ECOG FS:2 - Symptomatic, <50% confined to bed  Ocular: Sclerae unicteric, EOMs  intact  Ear-nose-throat: Oropharynx clear and moist  Lymphatic: No cervical or supraclavicular adenopathy Lungs no rales or rhonchi, no rubs Heart regular rate and rhythm Abd soft,  obese, nontender, positive bowel sounds MSK no focal spinal tenderness, no upper extremity lymphedema Neuro: non-focal, well-oriented,  positive  affect Breasts: Deferred   LAB RESULTS:  CMP     Component Value Date/Time   NA 143 01/11/2014 0946   NA 142 09/14/2013 0515   K 4.7 01/11/2014 0946   K 4.5 09/14/2013 0515   CL 102 09/14/2013 0515   CO2 29 01/11/2014 0946   CO2 30 09/14/2013 0515   GLUCOSE 214* 01/11/2014 0946   GLUCOSE 224* 09/14/2013 0515   BUN 71.6* 01/11/2014 0946   BUN 41* 09/14/2013 0515   CREATININE 2.2* 01/11/2014 0946   CREATININE 1.66* 09/14/2013 0515   CALCIUM 9.5 01/11/2014 0946   CALCIUM 9.2 09/14/2013 0515   PROT 6.6 01/11/2014 0946   PROT 5.9* 09/14/2013 0515   ALBUMIN 3.5 01/11/2014 0946   ALBUMIN 3.3* 09/14/2013 0515   AST 14 01/11/2014 0946   AST 10 09/14/2013 0515   ALT 12 01/11/2014 0946   ALT 13 09/14/2013 0515   ALKPHOS 90 01/11/2014 0946   ALKPHOS 72 09/14/2013 0515   BILITOT 0.49 01/11/2014 0946   BILITOT 0.7 09/14/2013 0515   GFRNONAA 27* 09/14/2013 0515   GFRAA 32* 09/14/2013 0515    I No results found for: SPEP  Lab Results  Component Value Date   WBC 5.8 02/01/2014   NEUTROABS 4.1 02/01/2014   HGB 10.3* 02/01/2014   HCT 32.1* 02/01/2014   MCV 91.5 02/01/2014   PLT 193 02/01/2014      Chemistry      Component Value Date/Time   NA 143 01/11/2014 0946   NA 142 09/14/2013 0515   K 4.7 01/11/2014 0946   K 4.5 09/14/2013 0515   CL 102 09/14/2013 0515   CO2 29 01/11/2014 0946   CO2 30 09/14/2013 0515   BUN 71.6* 01/11/2014 0946   BUN 41* 09/14/2013 0515   CREATININE 2.2* 01/11/2014 0946   CREATININE 1.66* 09/14/2013 0515      Component   Value Date/Time   CALCIUM 9.5 01/11/2014 0946   CALCIUM 9.2 09/14/2013 0515   ALKPHOS 90  01/11/2014 0946   ALKPHOS 72 09/14/2013 0515   AST 14 01/11/2014 0946   AST 10 09/14/2013 0515   ALT 12 01/11/2014 0946   ALT 13 09/14/2013 0515   BILITOT 0.49 01/11/2014 0946   BILITOT 0.7 09/14/2013 0515       No results found for: LABCA2  No components found for: LABCA125  No results for input(s): INR in the last 168 hours.  Urinalysis    Component Value Date/Time   COLORURINE YELLOW 09/13/2013 0523   APPEARANCEUR CLEAR 09/13/2013 0523   LABSPEC 1.010 09/13/2013 0523   PHURINE 7.0 09/13/2013 0523   GLUCOSEU 100* 09/13/2013 0523   HGBUR NEGATIVE 09/13/2013 0523   BILIRUBINUR NEGATIVE 09/13/2013 0523   KETONESUR NEGATIVE 09/13/2013 0523   PROTEINUR NEGATIVE 09/13/2013 0523   UROBILINOGEN 0.2 09/13/2013 0523   NITRITE NEGATIVE 09/13/2013 0523   LEUKOCYTESUR MODERATE* 09/13/2013 0523    STUDIES: ------------------------------------------------------------------- Transthoracic Echocardiography  Patient: [name], Sabrina L MR #: [id number] Study Date: 11/23/2013 Gender: F Age: 86 Height: 147.3 cm Weight: 73 kg BSA: 1.76 m^2 Pt. Status: Room:  ATTENDING Stoneking, Hal [id number] REFERRING Stoneking, Hal [id number] ORDERING ,  C REFERRING ,  C SONOGRAPHER Christy Little, RCS PERFORMING [facility], Outpatient  cc:  ------------------------------------------------------------------- LV EF: 58%     ASSESSMENT: 80 y.o. Sabrina [name] status post right lumpectomy and axillary lymph node sampling 03/30/2013 for a pT1c pN1a, stage II 8 invasive ductal carcinoma, grade 3, estrogen receptor 100% positive, progesterone receptor 100% positive, with an MIB-1 of 15%, and HER-2 amplified, with a signals ratio of 2.54 and the number per cell being 3.55.  (a) margins were positive but clear with additional surgery to 04/15/2013 ([id number])  (1) adjuvant chemotherapy /immunotherapy with paclitaxel and trastuzumab weekly started 05/10/2013, discontinued after 2  doses because of poor tolerance  (2) ventilation/perfusion scan 06/10/2013 documented right lower lobe pulmonary emboli; Doppler ultrasound 06/11/2013 documented bilateral lower extremity DVTs; started on warfarin managed through Dr. Stoneking's office  (3) anastrozole started June 2015;   (4) adjuvant radiation completed 10/20/2013  (5) Trastuzumab resumed 09/07/2013,to be continued at least through March 2016; most recent echocardiogram 11/23/2013 showed a well preserved ejection fraction  (6) genetics testing April 2015 was normal and did not reveal a mutation in any of these genes: APC, ATM, AXIN2, BARD1, BMPRIA, BRCA1, BRCA2, BRIP1, CDH1, CDK4, CDKN2A, CHEK2, EPCAM, FANCC, MLH1, MSH2, MSH6, MUTYH, NBN, PALB2, PMS2, PTEN, RAD51C, SMAD4, STK11, TP53, VHL, and XRCC2  (7) osteopenia: DEXA scan 08/17/2013 showed a T score of -2.1-- started Prolia (denosumab) 01/11/2014  PLAN: Yevonne is tolerating the trastuzumab without any untoward events. She is scheduled for her next echocardiogram 02/23/2014. The plan is to continue the trastuzumab through March 2016.  She is doing fine on the anastrozole, with no obvious side effects from that. I think her constipation is unlikely to be related. She normally has perhaps 3 bowel movements a week, the problem is that they are currently on the hard side. She is taking stool softeners once a day. I suggested she could take 2 tablets twice daily and if that doesn't do it she can try Merrill X. If she does not have any bowel movement after 3 days she will take a laxative on the fourth day.  I again encouraged her to do what she can in terms of exercise.   She has a good understanding of the   overall plan. She agrees with it. She knows a goal of treatment in her case is cure. She will call with any problems that may develop before her next visit here.  , C, MD   02/01/2014 9:39 AM 

## 2014-02-01 NOTE — Telephone Encounter (Signed)
Per staff message and POF I have scheduled appts. Advised scheduler of appts. [name]  

## 2014-02-01 NOTE — Telephone Encounter (Signed)
, °

## 2014-02-01 NOTE — Addendum Note (Signed)
Addended by: Laureen Abrahams on: 02/01/2014 06:19 PM   Modules accepted: Medications

## 2014-02-01 NOTE — Patient Instructions (Signed)
[facility] Discharge Instructions for Patients Receiving Chemotherapy  Today you received the following chemotherapy agents herceptin   To help prevent nausea and vomiting after your treatment, we encourage you to take your nausea medication as directed   If you develop nausea and vomiting that is not controlled by your nausea medication, call the clinic.   BELOW ARE SYMPTOMS THAT SHOULD BE REPORTED IMMEDIATELY:  *FEVER GREATER THAN 100.5 F  *CHILLS WITH OR WITHOUT FEVER  NAUSEA AND VOMITING THAT IS NOT CONTROLLED WITH YOUR NAUSEA MEDICATION  *UNUSUAL SHORTNESS OF BREATH  *UNUSUAL BRUISING OR BLEEDING  TENDERNESS IN MOUTH AND THROAT WITH OR WITHOUT PRESENCE OF ULCERS  *URINARY PROBLEMS  *BOWEL PROBLEMS  UNUSUAL RASH Items with * indicate a potential emergency and should be followed up as soon as possible.  Feel free to call the clinic you have any questions or concerns. The clinic phone number is [phone].  

## 2014-02-20 ENCOUNTER — Other Ambulatory Visit (HOSPITAL_COMMUNITY): Payer: Self-pay | Admitting: Cardiology

## 2014-02-20 DIAGNOSIS — C50411 Malignant neoplasm of upper-outer quadrant of right female breast: Secondary | ICD-10-CM

## 2014-02-22 ENCOUNTER — Other Ambulatory Visit: Payer: Self-pay | Admitting: Emergency Medicine

## 2014-02-22 ENCOUNTER — Ambulatory Visit (HOSPITAL_BASED_OUTPATIENT_CLINIC_OR_DEPARTMENT_OTHER): Payer: Medicare Other

## 2014-02-22 ENCOUNTER — Other Ambulatory Visit: Payer: Self-pay | Admitting: *Deleted

## 2014-02-22 ENCOUNTER — Other Ambulatory Visit: Payer: Self-pay | Admitting: Oncology

## 2014-02-22 ENCOUNTER — Ambulatory Visit (HOSPITAL_BASED_OUTPATIENT_CLINIC_OR_DEPARTMENT_OTHER): Payer: Medicare Other | Admitting: Lab

## 2014-02-22 ENCOUNTER — Encounter (HOSPITAL_COMMUNITY): Payer: Medicare Other

## 2014-02-22 ENCOUNTER — Ambulatory Visit (HOSPITAL_COMMUNITY): Payer: Medicare Other

## 2014-02-22 DIAGNOSIS — C50411 Malignant neoplasm of upper-outer quadrant of right female breast: Secondary | ICD-10-CM

## 2014-02-22 DIAGNOSIS — Z5112 Encounter for antineoplastic immunotherapy: Secondary | ICD-10-CM

## 2014-02-22 LAB — CBC WITH DIFFERENTIAL/PLATELET
BASO%: 0.2 % (ref 0.0–2.0)
Basophils Absolute: 0 10*3/uL (ref 0.0–0.1)
EOS%: 2.8 % (ref 0.0–7.0)
Eosinophils Absolute: 0.1 10*3/uL (ref 0.0–0.5)
HCT: 30.6 % — ABNORMAL LOW (ref 34.8–46.6)
HGB: 9.9 g/dL — ABNORMAL LOW (ref 11.6–15.9)
LYMPH%: 18.7 % (ref 14.0–49.7)
MCH: 29.4 pg (ref 25.1–34.0)
MCHC: 32.4 g/dL (ref 31.5–36.0)
MCV: 90.8 fL (ref 79.5–101.0)
MONO#: 0.3 10*3/uL (ref 0.1–0.9)
MONO%: 8.1 % (ref 0.0–14.0)
NEUT#: 3 10*3/uL (ref 1.5–6.5)
NEUT%: 70.2 % (ref 38.4–76.8)
Platelets: 167 10*3/uL (ref 145–400)
RBC: 3.37 10*6/uL — ABNORMAL LOW (ref 3.70–5.45)
RDW: 14.7 % — ABNORMAL HIGH (ref 11.2–14.5)
WBC: 4.2 10*3/uL (ref 3.9–10.3)
lymph#: 0.8 10*3/uL — ABNORMAL LOW (ref 0.9–3.3)

## 2014-02-22 LAB — COMPREHENSIVE METABOLIC PANEL (CC13)
ALT: 13 U/L (ref 0–55)
AST: 13 U/L (ref 5–34)
Albumin: 3.4 g/dL — ABNORMAL LOW (ref 3.5–5.0)
Alkaline Phosphatase: 93 U/L (ref 40–150)
Anion Gap: 8 mEq/L (ref 3–11)
BUN: 52.9 mg/dL — ABNORMAL HIGH (ref 7.0–26.0)
CO2: 25 mEq/L (ref 22–29)
Calcium: 8.5 mg/dL (ref 8.4–10.4)
Chloride: 108 mEq/L (ref 98–109)
Creatinine: 1.6 mg/dL — ABNORMAL HIGH (ref 0.6–1.1)
EGFR: 28 mL/min/{1.73_m2} — ABNORMAL LOW (ref >90)
Glucose: 169 mg/dl — ABNORMAL HIGH (ref 70–140)
Potassium: 4.4 mEq/L (ref 3.5–5.1)
Sodium: 141 mEq/L (ref 136–145)
Total Bilirubin: 0.42 mg/dL (ref 0.20–1.20)
Total Protein: 6.3 g/dL — ABNORMAL LOW (ref 6.4–8.3)

## 2014-02-22 MED ORDER — ACETAMINOPHEN 325 MG PO TABS
ORAL_TABLET | ORAL | Status: AC
  Filled 2014-02-22: qty 2

## 2014-02-22 MED ORDER — ACETAMINOPHEN 325 MG PO TABS
650.0000 mg | ORAL_TABLET | Freq: Once | ORAL | Status: AC
  Administered 2014-02-22: 650 mg via ORAL

## 2014-02-22 MED ORDER — TRASTUZUMAB CHEMO INJECTION 440 MG
6.0000 mg/kg | Freq: Once | INTRAVENOUS | Status: AC
  Administered 2014-02-22: 462 mg via INTRAVENOUS
  Filled 2014-02-22: qty 22

## 2014-02-22 MED ORDER — SODIUM CHLORIDE 0.9 % IV SOLN
Freq: Once | INTRAVENOUS | Status: AC
  Administered 2014-02-22: 10:00:00 via INTRAVENOUS

## 2014-02-22 MED ORDER — SODIUM CHLORIDE 0.9 % IJ SOLN
10.0000 mL | INTRAMUSCULAR | Status: DC | PRN
  Administered 2014-02-22: 10 mL via INTRAVENOUS
  Filled 2014-02-22: qty 10

## 2014-02-22 MED ORDER — HEPARIN SOD (PORK) LOCK FLUSH 100 UNIT/ML IV SOLN
500.0000 [IU] | Freq: Once | INTRAVENOUS | Status: AC
  Administered 2014-02-22: 500 [IU] via INTRAVENOUS
  Filled 2014-02-22: qty 5

## 2014-02-22 NOTE — Patient Instructions (Signed)
[facility] Discharge Instructions for Patients Receiving Chemotherapy  Today you received the following chemotherapy agents herceptin   To help prevent nausea and vomiting after your treatment, we encourage you to take your nausea medication as directed   If you develop nausea and vomiting that is not controlled by your nausea medication, call the clinic.   BELOW ARE SYMPTOMS THAT SHOULD BE REPORTED IMMEDIATELY:  *FEVER GREATER THAN 100.5 F  *CHILLS WITH OR WITHOUT FEVER  NAUSEA AND VOMITING THAT IS NOT CONTROLLED WITH YOUR NAUSEA MEDICATION  *UNUSUAL SHORTNESS OF BREATH  *UNUSUAL BRUISING OR BLEEDING  TENDERNESS IN MOUTH AND THROAT WITH OR WITHOUT PRESENCE OF ULCERS  *URINARY PROBLEMS  *BOWEL PROBLEMS  UNUSUAL RASH Items with * indicate a potential emergency and should be followed up as soon as possible.  Feel free to call the clinic you have any questions or concerns. The clinic phone number is [phone].  

## 2014-02-23 ENCOUNTER — Ambulatory Visit (HOSPITAL_BASED_OUTPATIENT_CLINIC_OR_DEPARTMENT_OTHER)
Admission: RE | Admit: 2014-02-23 | Discharge: 2014-02-23 | Disposition: A | Discharge status: Home / Self Care | Payer: Medicare Other | Source: Ambulatory Visit | Attending: Internal Medicine | Admitting: Internal Medicine

## 2014-02-23 ENCOUNTER — Encounter (HOSPITAL_COMMUNITY): Payer: Self-pay

## 2014-02-23 ENCOUNTER — Ambulatory Visit (HOSPITAL_COMMUNITY)
Admission: RE | Admit: 2014-02-23 | Discharge: 2014-02-23 | Disposition: A | Discharge status: Home / Self Care | Payer: Medicare Other | Source: Ambulatory Visit | Attending: Internal Medicine | Admitting: Internal Medicine

## 2014-02-23 VITALS — BP 156/58 | HR 76 | Wt 160.8 lb

## 2014-02-23 DIAGNOSIS — C50411 Malignant neoplasm of upper-outer quadrant of right female breast: Secondary | ICD-10-CM | POA: Diagnosis not present

## 2014-02-23 DIAGNOSIS — I350 Nonrheumatic aortic (valve) stenosis: Secondary | ICD-10-CM

## 2014-02-23 DIAGNOSIS — I2699 Other pulmonary embolism without acute cor pulmonale: Secondary | ICD-10-CM

## 2014-02-23 DIAGNOSIS — I5032 Chronic diastolic (congestive) heart failure: Secondary | ICD-10-CM | POA: Insufficient documentation

## 2014-02-23 DIAGNOSIS — I359 Nonrheumatic aortic valve disorder, unspecified: Secondary | ICD-10-CM

## 2014-02-23 MED ORDER — FUROSEMIDE 40 MG PO TABS: ORAL_TABLET | ORAL | Status: DC

## 2014-02-23 NOTE — Progress Notes (Signed)
Patient ID: [name], female   DOB: [birth year], 80 y.o.   MRN: [id number]  PCP: Dr Felipa Eth Oncologist: Dr. Chancy Milroy  HPI: Sabrina [name] is an 80 year old with a history of DM, HTN, S/P R breast lumpectomy and porta cath. Stage II (T1 N1) invasive ductal carcinoma,grade 3 ER PR positive HER-2/neu positive.    On coumadin for DVT/PE 4/15  Received Taxol on a weekly basis for total of 12 weeks finished 3/15.  On Herceptin every 3 weeks to complete total of one year of therapy )finish 4/16)  Echo 04/21/13; EF 60-65% Grade I DD. Significant MAC. Mild aortic stenosis. Lateral s' 8.6 GLS -15%          08/11/13: EF 60% Grade I DD Significant MAC. Mild aortic stenosis. Lateral s' 10.1 GLS -21.7%          11/25/13: EF 55-60%, grade I DD, significant MAC, mild AS, Lateral s' 10.3, GLS -20.1%, RV mildly HK         02/23/14: EF 60%, grade I DD, significant MAC, mild AS, Lateral s' 10.6, GLS -21.3%, RV mildly dialted. Normal function. IVC dilated. PAP not significantly increased.   Follow up for Breast Cancer: She is done with radiation and continues with Herceptin q3 weeks until 4/16. Denies SOB, orthopnea, PND or CP. Occasional edema.Taking lasix daily now which helps.   FH: No family history of heart disease.  SH: Lives at home with husband. Nonsmoker.   ROS: All systems negative except as listed in HPI, PMH and Problem List.  Past Medical History  Diagnosis Date  . Heart murmur     no known problems; states did not know she had murmur until age 80  . Immature cataract   . Arthritis     neck  . Wears partial dentures     lower  . Dental crowns present   . Hypertension     fluctuates, especially when stressed; has been on med. > 20 yr.  . Non-insulin dependent type 2 diabetes mellitus   . High cholesterol   . Chronic kidney disease (CKD), stage III (moderate)     nephrologist, Dr. [name]  . Breast cancer 03/2013    right  . Radiation 09/06/13-10/20/13    Right Breast Cancer     Current Outpatient Prescriptions  Medication Sig Dispense Refill  . anastrozole (ARIMIDEX) 1 MG tablet Take 1 tablet (1 mg total) by mouth daily. 30 tablet 3  . atorvastatin (LIPITOR) 10 MG tablet Take 10 mg by mouth daily.    . calcium citrate (CALCITRATE - DOSED IN MG ELEMENTAL CALCIUM) 950 MG tablet Take 200 mg of elemental calcium by mouth daily.    . calcium-vitamin D (OSCAL WITH D) 250-125 MG-UNIT per tablet Take 1 tablet by mouth 2 (two) times daily.    . cholecalciferol (VITAMIN D) 1000 UNITS tablet Take 1,000 Units by mouth daily.    . cloNIDine (CATAPRES) 0.2 MG tablet Take 0.2 mg by mouth 2 (two) times daily.    . furosemide (LASIX) 40 MG tablet Take 1 tablet (40 mg total) by mouth daily. Take extra tab as needed for swelling 60 tablet 6  . glipiZIDE (GLUCOTROL) 2.5 mg TABS tablet Take 0.5 tablets (2.5 mg total) by mouth 2 (two) times daily before a meal. 60 tablet 1  . glucose blood test strip Use as instructed 100 each 12  . glucose monitoring kit (FREESTYLE) monitoring kit 1 each by Does not apply route as needed for other.  1 each 1  . IRON PO Take 65 mg by mouth daily.    [name] lidocaine-prilocaine (EMLA) cream Apply 1 - 2 hours to port -a-cath before being accessed. 30 g 6  . losartan (COZAAR) 25 MG tablet     . potassium chloride SA (K-DUR,KLOR-CON) 20 MEQ tablet TAKE 1 TABLET ONCE DAILY. 30 tablet 6  . traMADol (ULTRAM) 50 MG tablet Take 1 tablet (50 mg total) by mouth 2 (two) times daily. 60 tablet 1  . verapamil (VERELAN PM) 240 MG 24 hr capsule Take 240 mg by mouth daily.     [name] warfarin (COUMADIN) 3 MG tablet Take 3 mg by mouth daily. Take 49m tablets for 2 days then take two 179mtablets (78m13m on the third day     No current facility-administered medications for this encounter.    Filed Vitals:   02/23/14 1206  BP: 156/58  Pulse: 76  Weight: 160 lb 12.8 oz (72.938 kg)  SpO2: 97%   PHYSICAL EXAM: General:  Elderly. Walks with cane. No resp difficulty HEENT:  normal.  Neck: supple. JVP to jaw Carotids 2+ bilaterally; no bruits. No lymphadenopathy or thryomegaly appreciated. Cor: PMI normal. Regular rate & rhythm. Soft AS murmur Lungs: clear Abdomen: obese. soft, nontender, non distended. + large ventral hernia No hepatosplenomegaly. No bruits or masses. Good bowel sounds. Extremities: no cyanosis, clubbing, rash, 1-2+ edema Neuro: alert & orientedx3, cranial nerves grossly intact. Moves all 4 extremities w/o difficulty. Affect pleasant.  ASSESSMENT & PLAN:  1. Breast cancer -  - I reviewed echos personally. EF and Doppler parameters stable. No HF on exam. Continue Herceptin. Repeat echo in April 2. RV dilation - unclear etiology. Will repeat VQ to assess for chronic PE.  3. Chronic diastolic HF  - continues with fluid on board. Will increase lasix to 40/20. Check BMET at Cancer center 1/12 4. HTN - BP labile. Can increase clonidine as needed or given DM2 could consider ACE-I or ARB (previously had cough with ACE)  5. Aortic stenosis - mild. Continue to follow.  6. PE/DVT - on coumadin  DanBenay Spice:41 PM

## 2014-02-23 NOTE — Addendum Note (Signed)
Encounter addended by: Scarlette Calico, RN on: 02/23/2014  1:22 PM<BR>     Documentation filed: Patient Instructions Section, Dx Association, Orders

## 2014-02-23 NOTE — Patient Instructions (Signed)
Increase Furosemide to 40 mg (1 tab) in AM and 20 mg (1/2 tab) in PM  VQ Scan  We will contact you in 3-4 months to schedule your next appointment and echocardiogram

## 2014-02-23 NOTE — Progress Notes (Signed)
  Echocardiogram 2D Echocardiogram has been performed.  Vika Buske FRANCES 02/23/2014, 1:12 PM

## 2014-03-01 ENCOUNTER — Ambulatory Visit (HOSPITAL_COMMUNITY)
Admission: RE | Admit: 2014-03-01 | Discharge: 2014-03-01 | Disposition: A | Discharge status: Home / Self Care | Payer: Medicare Other | Source: Ambulatory Visit | Attending: Internal Medicine | Admitting: Internal Medicine

## 2014-03-01 DIAGNOSIS — R0602 Shortness of breath: Secondary | ICD-10-CM | POA: Insufficient documentation

## 2014-03-01 DIAGNOSIS — I2699 Other pulmonary embolism without acute cor pulmonale: Secondary | ICD-10-CM

## 2014-03-01 DIAGNOSIS — I5032 Chronic diastolic (congestive) heart failure: Secondary | ICD-10-CM

## 2014-03-01 MED ORDER — TECHNETIUM TC 99M DIETHYLENETRIAME-PENTAACETIC ACID: 40.0000 | Freq: Once | INTRAVENOUS | Status: AC | PRN

## 2014-03-01 MED ORDER — TECHNETIUM TO 99M ALBUMIN AGGREGATED
6.0000 | Freq: Once | INTRAVENOUS | Status: AC | PRN
  Administered 2014-03-01: 6 via INTRAVENOUS

## 2014-03-04 HISTORY — PX: PORT-A-CATH REMOVAL: SHX5289

## 2014-03-07 ENCOUNTER — Other Ambulatory Visit: Payer: Self-pay | Admitting: Radiation Oncology

## 2014-03-07 ENCOUNTER — Other Ambulatory Visit: Payer: Self-pay | Admitting: Geriatric Medicine

## 2014-03-07 DIAGNOSIS — Z853 Personal history of malignant neoplasm of breast: Secondary | ICD-10-CM

## 2014-03-10 ENCOUNTER — Other Ambulatory Visit: Payer: Self-pay | Admitting: Radiation Oncology

## 2014-03-10 ENCOUNTER — Other Ambulatory Visit: Payer: Self-pay

## 2014-03-10 DIAGNOSIS — Z853 Personal history of malignant neoplasm of breast: Secondary | ICD-10-CM

## 2014-03-11 ENCOUNTER — Telehealth: Payer: Self-pay | Admitting: Oncology

## 2014-03-11 ENCOUNTER — Telehealth: Payer: Self-pay | Admitting: *Deleted

## 2014-03-11 ENCOUNTER — Other Ambulatory Visit: Payer: Self-pay | Admitting: Oncology

## 2014-03-11 DIAGNOSIS — R918 Other nonspecific abnormal finding of lung field: Secondary | ICD-10-CM

## 2014-03-11 NOTE — Telephone Encounter (Signed)
per pof to sch pt appt-sent Anne/melissa email to override appt time-sent MW email to see if herc can be moved out to a later time to coordinate w/MD appt-will call pt after reply

## 2014-03-14 ENCOUNTER — Encounter (HOSPITAL_COMMUNITY): Payer: Self-pay

## 2014-03-14 ENCOUNTER — Other Ambulatory Visit: Payer: Self-pay | Admitting: Emergency Medicine

## 2014-03-14 ENCOUNTER — Ambulatory Visit (HOSPITAL_COMMUNITY)
Admission: RE | Admit: 2014-03-14 | Discharge: 2014-03-14 | Disposition: A | Discharge status: Home / Self Care | Payer: Medicare Other | Source: Ambulatory Visit | Attending: Oncology | Admitting: Oncology

## 2014-03-14 DIAGNOSIS — C50411 Malignant neoplasm of upper-outer quadrant of right female breast: Secondary | ICD-10-CM

## 2014-03-14 DIAGNOSIS — R918 Other nonspecific abnormal finding of lung field: Secondary | ICD-10-CM

## 2014-03-14 DIAGNOSIS — R911 Solitary pulmonary nodule: Secondary | ICD-10-CM | POA: Diagnosis present

## 2014-03-14 DIAGNOSIS — Z923 Personal history of irradiation: Secondary | ICD-10-CM | POA: Diagnosis not present

## 2014-03-14 DIAGNOSIS — C50911 Malignant neoplasm of unspecified site of right female breast: Secondary | ICD-10-CM | POA: Diagnosis not present

## 2014-03-14 MED ORDER — IOHEXOL 300 MG/ML  SOLN
60.0000 mL | Freq: Once | INTRAMUSCULAR | Status: AC | PRN
  Administered 2014-03-14: 60 mL via INTRAVENOUS

## 2014-03-15 ENCOUNTER — Other Ambulatory Visit (HOSPITAL_BASED_OUTPATIENT_CLINIC_OR_DEPARTMENT_OTHER): Payer: Medicare Other

## 2014-03-15 ENCOUNTER — Ambulatory Visit (HOSPITAL_BASED_OUTPATIENT_CLINIC_OR_DEPARTMENT_OTHER): Payer: Medicare Other

## 2014-03-15 ENCOUNTER — Telehealth: Payer: Self-pay | Admitting: Oncology

## 2014-03-15 ENCOUNTER — Ambulatory Visit (HOSPITAL_BASED_OUTPATIENT_CLINIC_OR_DEPARTMENT_OTHER): Payer: Medicare Other | Admitting: Nurse Practitioner

## 2014-03-15 ENCOUNTER — Encounter: Payer: Self-pay | Admitting: Nurse Practitioner

## 2014-03-15 VITALS — BP 110/75 | HR 78 | Temp 97.5°F | Resp 20 | Wt 163.4 lb

## 2014-03-15 DIAGNOSIS — C50411 Malignant neoplasm of upper-outer quadrant of right female breast: Secondary | ICD-10-CM

## 2014-03-15 DIAGNOSIS — Z170 Estrogen receptor positive status [ER+]: Secondary | ICD-10-CM

## 2014-03-15 DIAGNOSIS — Z5112 Encounter for antineoplastic immunotherapy: Secondary | ICD-10-CM

## 2014-03-15 DIAGNOSIS — R9389 Abnormal findings on diagnostic imaging of other specified body structures: Secondary | ICD-10-CM

## 2014-03-15 DIAGNOSIS — C773 Secondary and unspecified malignant neoplasm of axilla and upper limb lymph nodes: Secondary | ICD-10-CM

## 2014-03-15 DIAGNOSIS — M8580 Other specified disorders of bone density and structure, unspecified site: Secondary | ICD-10-CM

## 2014-03-15 DIAGNOSIS — I2699 Other pulmonary embolism without acute cor pulmonale: Secondary | ICD-10-CM

## 2014-03-15 LAB — CBC WITH DIFFERENTIAL/PLATELET
BASO%: 1 % (ref 0.0–2.0)
Basophils Absolute: 0 10*3/uL (ref 0.0–0.1)
EOS%: 3.6 % (ref 0.0–7.0)
Eosinophils Absolute: 0.2 10*3/uL (ref 0.0–0.5)
HCT: 31.8 % — ABNORMAL LOW (ref 34.8–46.6)
HGB: 10.2 g/dL — ABNORMAL LOW (ref 11.6–15.9)
LYMPH%: 18.4 % (ref 14.0–49.7)
MCH: 29.5 pg (ref 25.1–34.0)
MCHC: 32.2 g/dL (ref 31.5–36.0)
MCV: 91.8 fL (ref 79.5–101.0)
MONO#: 0.4 10*3/uL (ref 0.1–0.9)
MONO%: 8.5 % (ref 0.0–14.0)
NEUT#: 3.4 10*3/uL (ref 1.5–6.5)
NEUT%: 68.5 % (ref 38.4–76.8)
Platelets: 193 10*3/uL (ref 145–400)
RBC: 3.47 10*6/uL — ABNORMAL LOW (ref 3.70–5.45)
RDW: 14.6 % — ABNORMAL HIGH (ref 11.2–14.5)
WBC: 4.9 10*3/uL (ref 3.9–10.3)
lymph#: 0.9 10*3/uL (ref 0.9–3.3)

## 2014-03-15 LAB — COMPREHENSIVE METABOLIC PANEL (CC13)
ALT: 15 U/L (ref 0–55)
AST: 17 U/L (ref 5–34)
Albumin: 3.7 g/dL (ref 3.5–5.0)
Alkaline Phosphatase: 104 U/L (ref 40–150)
Anion Gap: 6 mEq/L (ref 3–11)
BUN: 57.3 mg/dL — ABNORMAL HIGH (ref 7.0–26.0)
CO2: 25 mEq/L (ref 22–29)
Calcium: 8.2 mg/dL — ABNORMAL LOW (ref 8.4–10.4)
Chloride: 109 mEq/L (ref 98–109)
Creatinine: 1.8 mg/dL — ABNORMAL HIGH (ref 0.6–1.1)
EGFR: 25 mL/min/{1.73_m2} — ABNORMAL LOW (ref >90)
Glucose: 248 mg/dl — ABNORMAL HIGH (ref 70–140)
Potassium: 4.3 mEq/L (ref 3.5–5.1)
Sodium: 140 mEq/L (ref 136–145)
Total Bilirubin: 0.49 mg/dL (ref 0.20–1.20)
Total Protein: 6.8 g/dL (ref 6.4–8.3)

## 2014-03-15 MED ORDER — HEPARIN SOD (PORK) LOCK FLUSH 100 UNIT/ML IV SOLN
500.0000 [IU] | Freq: Once | INTRAVENOUS | Status: AC | PRN
  Administered 2014-03-15: 500 [IU]
  Filled 2014-03-15: qty 5

## 2014-03-15 MED ORDER — SODIUM CHLORIDE 0.9 % IV SOLN
Freq: Once | INTRAVENOUS | Status: AC
  Administered 2014-03-15: 11:00:00 via INTRAVENOUS

## 2014-03-15 MED ORDER — PROCHLORPERAZINE EDISYLATE 5 MG/ML IJ SOLN
INTRAMUSCULAR | Status: AC
  Filled 2014-03-15: qty 2

## 2014-03-15 MED ORDER — ACETAMINOPHEN 325 MG PO TABS
ORAL_TABLET | ORAL | Status: AC
  Filled 2014-03-15: qty 2

## 2014-03-15 MED ORDER — TRASTUZUMAB CHEMO INJECTION 440 MG
6.0000 mg/kg | Freq: Once | INTRAVENOUS | Status: AC
  Administered 2014-03-15: 462 mg via INTRAVENOUS
  Filled 2014-03-15: qty 22

## 2014-03-15 MED ORDER — PROCHLORPERAZINE EDISYLATE 5 MG/ML IJ SOLN
5.0000 mg | Freq: Four times a day (QID) | INTRAMUSCULAR | Status: DC | PRN
  Administered 2014-03-15: 5 mg via INTRAVENOUS

## 2014-03-15 MED ORDER — ACETAMINOPHEN 325 MG PO TABS
650.0000 mg | ORAL_TABLET | Freq: Once | ORAL | Status: AC
  Administered 2014-03-15: 650 mg via ORAL

## 2014-03-15 MED ORDER — SODIUM CHLORIDE 0.9 % IJ SOLN
10.0000 mL | INTRAMUSCULAR | Status: DC | PRN
  Administered 2014-03-15: 10 mL
  Filled 2014-03-15: qty 10

## 2014-03-15 NOTE — Progress Notes (Signed)
[place]  [phone] [phone]     ID: ADIBA FARGNOLI DOB: 1926-05-31  MR#: [id number]  NOM#:[id number]  PCP: [name], MD GYN: SU: Rolm Bookbinder OTHER MD: Thea Silversmith  CHIEF COMPLAINT: Estrogen receptor positive breast cancer  CURRENT TREATMENT: Anastrozole, trastuzumab   BREAST CANCER HISTORY: From Dr. Bernell List Khan's intake note 03/24/2013:  "Avary L Sestak is a 81 y.o. female. Who underwent a screening mammogram. She was found to have a right breast mass measuring 1 cm. There were no abnormalities on her physical exam. Patient had ultrasound performed that showed 1.3 x 1.2 x 1.0 cm hypoechoic mass in the upper outer quadrant of the right breast. In the right axilla there was an abnormal appearing lymph node measuring 1.65 1.4 x 0.8 cm. Both her biopsy. The lymph node was positive for metastatic carcinoma. The primary breast tumor biopsy showed invasive ductal carcinoma, grade 2, ER positive PR positive HER-2/neu equivocal with a proliferation marker Ki-67 15%.."  On 03/30/2013 the patient underwent right lumpectomy in right axillary lymph node sampling. The pathology ([id number]) showed a 1.4 cm invasive ductal carcinoma grade 3, present at the lateral and posterior margins. 3 axillary lymph nodes (non-sentinel lymph nodes) were sampled, one of which had a micrometastatic tumor deposits with extracapsular extension. Repeat HER-2 was positive, with a signals ratio of 2.54 and the number per cell of 3.55.  The patient was started on weekly Taxol and trastuzumab, but tolerated treatment poorly and develop bilateral DVTs and pulmonary embolism April 2015. At that time her chemotherapy was interrupted.  Her subsequent history is as detailed below.   INTERVAL HISTORY: Diego returns today for followup of her breast cancer accompanied by her husband [name]. She had a chest xray done in late December ordered by Dr. [name] to evaluate the  size of her heart, per the patient. This xray showed an opacity in the right upper lobe that could reflect pneumonia or a new neoplasm. The patient was sent for a repeat chest CT for further evaluation and is here for the results today. She received trasuzumab earlier today as scheduled. She is also on anastrozole daily. She tolerates both drugs well with few complaints.   REVIEW OF SYSTEMS: Priyana denies fevers, chills, nausea or vomiting. She is constipated possibly because of her iron supplement, but also believes the anastrozole contributes to it. She has some shortness of breath with exertion and a mild cough in the morning, but she denies chest pain. She is fatigued easily, but can move around at home without stopping as long as she uses her cane. She is taking an extra half dose of lasix for her ankle swelling. She is eating and drinking well. A detailed review of systems is otherwise negative.   PAST MEDICAL HISTORY: Past Medical History  Diagnosis Date  . Heart murmur     no known problems; states did not know she had murmur until age 10  . Immature cataract   . Arthritis     neck  . Wears partial dentures     lower  . Dental crowns present   . Hypertension     fluctuates, especially when stressed; has been on med. > 20 yr.  . Non-insulin dependent type 2 diabetes mellitus   . High cholesterol   . Chronic kidney disease (CKD), stage III (moderate)     nephrologist, Dr. [name]  . Breast cancer 03/2013    right  . Radiation 09/06/13-10/20/13  Right Breast Cancer    PAST SURGICAL HISTORY: Past Surgical History  Procedure Laterality Date  . Tonsillectomy      as a child  . Breast surgery Right 11/1958    right breast biopsy benign  . Dilation and curettage of uterus    . Breast lumpectomy with needle localization Right 03/30/2013    Procedure: BREAST LUMPECTOMY WITH NEEDLE LOCALIZATION;  Surgeon: Rolm Bookbinder, MD;  Location: [facility];  Service: General;  Laterality:  Right;  . Axillary lymph node dissection Right 03/30/2013    Procedure: AXILLARY LYMPH NODE DISSECTION;  Surgeon: Rolm Bookbinder, MD;  Location: [facility];  Service: General;  Laterality: Right;  . Re-excision of breast cancer,superior margins Right 04/15/2013    Procedure: RE-EXCISION OF RIGHT BREAST  MARGINS;  Surgeon: Rolm Bookbinder, MD;  Location: [facility];  Service: General;  Laterality: Right;  . Portacath placement N/A 04/15/2013    Procedure: INSERTION PORT-A-CATH;  Surgeon: Rolm Bookbinder, MD;  Location: [facility];  Service: General;  Laterality: N/A;    FAMILY HISTORY Family History  Problem Relation Age of Onset  . Pneumonia Mother   . Heart attack Father   . Breast cancer Other 14    niece  . Breast cancer Other 86    niece   the patient's father died in his 43s, in the setting of multiple medical problems. The patient's mother died in her 37s from pneumonia. The patient had 4 sisters, one of whom has died from complications of diabetes. She had a brother who died at 77 months. There is no history of breast cancer in the immediate family although the patient does have 2 nieces with breast cancer. The patient has been tested for the BRCA mutations and her genetics was normal  GYNECOLOGIC HISTORY:  No LMP recorded. Patient is postmenopausal. Menarche age 52, the patient is GX P0. She went through menopause in her 37s, took hormone replacement until she was in her 50s.   SOCIAL HISTORY:  Rayan worked as the [employer] and locally. She and her husband [name] greatly enjoyed [employer] [place]. [name] worked for the [employer] in [place].    ADVANCED DIRECTIVES: In place   HEALTH MAINTENANCE: History  Substance Use Topics  . Smoking status: Never Smoker   . Smokeless tobacco: Never Used     Comment: only smoked 2 packs cigarettes total  . Alcohol Use: 0.0 oz/week     Comment: 2-3 glasses wine/week     Colonoscopy:  PAP:  Bone  density:  Lipid panel:  No Known Allergies  Current Outpatient Prescriptions  Medication Sig Dispense Refill  . anastrozole (ARIMIDEX) 1 MG tablet Take 1 tablet (1 mg total) by mouth daily. 30 tablet 3  . atorvastatin (LIPITOR) 10 MG tablet Take 10 mg by mouth daily.    . calcium citrate (CALCITRATE - DOSED IN MG ELEMENTAL CALCIUM) 950 MG tablet Take 200 mg of elemental calcium by mouth daily.    . calcium-vitamin D (OSCAL WITH D) 250-125 MG-UNIT per tablet Take 1 tablet by mouth 2 (two) times daily.    . cholecalciferol (VITAMIN D) 1000 UNITS tablet Take 1,000 Units by mouth daily.    . cloNIDine (CATAPRES) 0.2 MG tablet Take 0.2 mg by mouth 2 (two) times daily.    . furosemide (LASIX) 40 MG tablet Take 1 tab in AM and 1/2 tab in PM 60 tablet 6  . glipiZIDE (GLUCOTROL) 2.5 mg TABS tablet Take 0.5 tablets (2.5 mg  total) by mouth 2 (two) times daily before a meal. 60 tablet 1  . glucose blood test strip Use as instructed 100 each 12  . glucose monitoring kit (FREESTYLE) monitoring kit 1 each by Does not apply route as needed for other. 1 each 1  . IRON PO Take 65 mg by mouth daily.    [name] lidocaine-prilocaine (EMLA) cream Apply 1 - 2 hours to port -a-cath before being accessed. 30 g 6  . losartan (COZAAR) 25 MG tablet     . potassium chloride SA (K-DUR,KLOR-CON) 20 MEQ tablet TAKE 1 TABLET ONCE DAILY. (Patient taking differently: Pt takes this med Mon, Wed, Fri.) 30 tablet 6  . traMADol (ULTRAM) 50 MG tablet Take 1 tablet (50 mg total) by mouth 2 (two) times daily. 60 tablet 1  . verapamil (VERELAN PM) 240 MG 24 hr capsule Take 240 mg by mouth daily.     [name] warfarin (COUMADIN) 3 MG tablet Take 3 mg by mouth daily. Take 58m tablets for 1 day then take two 170mtablets (54m50m on the fourth  day     No current facility-administered medications for this visit.    OBJECTIVE: Elderly white woman In no acute distress  Filed Vitals:   03/15/14 1551  BP: 110/75  Pulse: 78  Temp: 97.5 F (36.4 C)    Resp: 20     Body mass index is 32.99 kg/(m^2).    ECOG FS:2 - Symptomatic, <50% confined to bed  Skin: warm, dry  HEENT: sclerae anicteric, conjunctivae pink, oropharynx clear. No thrush or mucositis.  Lymph Nodes: No cervical or supraclavicular lymphadenopathy  Lungs: clear to auscultation bilaterally, no rales, wheezes, or rhonci  Heart: regular rate and rhythm  Abdomen: round, soft, non tender, positive bowel sounds  Musculoskeletal: No focal spinal tenderness, +2 edema to bilateral ankles Neuro: non focal, well oriented, positive affect  Breast: deferred   LAB RESULTS:  CMP     Component Value Date/Time   NA 140 03/15/2014 0923   NA 142 09/14/2013 0515   K 4.3 03/15/2014 0923   K 4.5 09/14/2013 0515   CL 102 09/14/2013 0515   CO2 25 03/15/2014 0923   CO2 30 09/14/2013 0515   GLUCOSE 248* 03/15/2014 0923   GLUCOSE 224* 09/14/2013 0515   BUN 57.3* 03/15/2014 0923   BUN 41* 09/14/2013 0515   CREATININE 1.8* 03/15/2014 0923   CREATININE 1.66* 09/14/2013 0515   CALCIUM 8.2* 03/15/2014 0923   CALCIUM 9.2 09/14/2013 0515   PROT 6.8 03/15/2014 0923   PROT 5.9* 09/14/2013 0515   ALBUMIN 3.7 03/15/2014 0923   ALBUMIN 3.3* 09/14/2013 0515   AST 17 03/15/2014 0923   AST 10 09/14/2013 0515   ALT 15 03/15/2014 0923   ALT 13 09/14/2013 0515   ALKPHOS 104 03/15/2014 0923   ALKPHOS 72 09/14/2013 0515   BILITOT 0.49 03/15/2014 0923   BILITOT 0.7 09/14/2013 0515   GFRNONAA 27* 09/14/2013 0515   GFRAA 32* 09/14/2013 0515    I No results found for: SPEP  Lab Results  Component Value Date   WBC 4.9 03/15/2014   NEUTROABS 3.4 03/15/2014   HGB 10.2* 03/15/2014   HCT 31.8* 03/15/2014   MCV 91.8 03/15/2014   PLT 193 03/15/2014      Chemistry      Component Value Date/Time   NA 140 03/15/2014 0923   NA 142 09/14/2013 0515   K 4.3 03/15/2014 0923   K 4.5 09/14/2013 0515   CL 102  09/14/2013 0515   CO2 25 03/15/2014 0923   CO2 30 09/14/2013 0515   BUN 57.3*  03/15/2014 0923   BUN 41* 09/14/2013 0515   CREATININE 1.8* 03/15/2014 0923   CREATININE 1.66* 09/14/2013 0515      Component Value Date/Time   CALCIUM 8.2* 03/15/2014 0923   CALCIUM 9.2 09/14/2013 0515   ALKPHOS 104 03/15/2014 0923   ALKPHOS 72 09/14/2013 0515   AST 17 03/15/2014 0923   AST 10 09/14/2013 0515   ALT 15 03/15/2014 0923   ALT 13 09/14/2013 0515   BILITOT 0.49 03/15/2014 0923   BILITOT 0.7 09/14/2013 0515       No results found for: LABCA2  No components found for: [id number]  No results for input(s): INR in the last 168 hours.  Urinalysis    Component Value Date/Time   COLORURINE YELLOW 09/13/2013 0523   APPEARANCEUR CLEAR 09/13/2013 0523   LABSPEC 1.010 09/13/2013 0523   PHURINE 7.0 09/13/2013 0523   GLUCOSEU 100* 09/13/2013 0523   HGBUR NEGATIVE 09/13/2013 0523   BILIRUBINUR NEGATIVE 09/13/2013 0523   KETONESUR NEGATIVE 09/13/2013 0523   PROTEINUR NEGATIVE 09/13/2013 0523   UROBILINOGEN 0.2 09/13/2013 0523   NITRITE NEGATIVE 09/13/2013 0523   LEUKOCYTESUR MODERATE* 09/13/2013 0523    STUDIES: Most recent echocardiogram on 02/23/14 showed an EF of 55-60%  Dg Chest 2 View  03/01/2014   CLINICAL DATA:  No chest pain or shortness of breath. Breast cancer.  EXAM: CHEST  2 VIEW  COMPARISON:  06/10/2013  FINDINGS: There is an 8.7 x 4.5 cm opacity in the right upper lobe. The lungs are otherwise clear. No pleural effusion or pneumothorax. The heart and mediastinum are stable. Left-sided Port-A-Cath with the tip projecting over the SVC. There is no acute osseous abnormality.  IMPRESSION: 8.7 x 4.5 cm opacity in the right upper lobe which may reflect pneumonia versus neoplasm. This is new compared with 06/10/2013.   Electronically Signed   By: Kathreen Devoid   On: 03/01/2014 13:50   Ct Chest W Contrast  03/14/2014   CLINICAL DATA:  Subsequent encounter for right lung nodule seen on x-ray of 03/01/2014.  EXAM: CT CHEST WITH CONTRAST  TECHNIQUE: Multidetector CT  imaging of the chest was performed during intravenous contrast administration.  CONTRAST:  40m OMNIPAQUE IOHEXOL 300 MG/ML  SOLN  COMPARISON:  Chest x-ray from 03/01/2014.  FINDINGS: Mediastinum / Lymph Nodes: Left Port-A-Cath tip is positioned at the proximal SVC level. There is no axillary lymphadenopathy. Tiny left thyroid nodules are evident. No mediastinal or hilar lymphadenopathy. Heart size is normal. Dense calcification is noted in the mitral annulus. No pericardial effusion.  Lungs / Pleura: Peripheral airspace consolidation is seen in the antero lateral right upper lobe with associated air bronchograms in mild bronchiectasis. Comparing today's scout image to the previous chest x-ray, the area of involves lung does not appear substantially changed.  MSK / Soft Tissues: Bone windows reveal no worrisome lytic or sclerotic osseous lesions.  Upper Abdomen: Densely calcified gallstones measure up to 18 mm in diameter. There is some thickening of the left adrenal gland without a discrete nodule or mass.  IMPRESSION: Peripheral airspace consolidation in the anterolateral right upper lobe not substantially changed since the chest x-ray of 03/01/2014. That chest x-ray represented the first imaging performed after the patient completed radiation treatment for right breast cancer and may well reflect post radiation change/fibrosis. There is associated peripheral nodular area of airspace consolidation in the right middle lobe which may  be related to the right upper lobe opacity but infection in this area cannot be excluded. Followup CT imaging could be used to ensure stability.   Electronically Signed   By: Misty Stanley M.D.   On: 03/14/2014 16:49   Nm Pulmonary Perf And Vent  03/01/2014   CLINICAL DATA:  Shortness of breath.  EXAM: NUCLEAR MEDICINE VENTILATION - PERFUSION LUNG SCAN  TECHNIQUE: Ventilation images were obtained in multiple projections using inhaled aerosol technetium 99 M DTPA. Perfusion images  were obtained in multiple projections after intravenous injection of Tc-23mMAA.  RADIOPHARMACEUTICALS:  40.0 mCi Tc-931mTPA aerosol and 6.0 mCi Tc-9965mA  COMPARISON:  Chest x-ray 03/01/2014.  FINDINGS: Tiny bilateral matched ventilation perfusion defects. This is indeterminate for pulmonary embolus.  IMPRESSION: Tiny bilateral matched ventilation-perfusion defects. Indeterminate scan for pulmonary embolus.   Electronically Signed   By: ThoMarcello Mooresegister   On: 03/01/2014 15:13    ASSESSMENT: [age]. [place] woman status post right lumpectomy and axillary lymph node sampling 03/30/2013 for a pT1c pN1a, stage II 8 invasive ductal carcinoma, grade 3, estrogen receptor 100% positive, progesterone receptor 100% positive, with an MIB-1 of 15%, and HER-2 amplified, with a signals ratio of 2.54 and the number per cell being 3.55.  (a) margins were positive but clear with additional surgery to 04/15/2013 ([id number](1) adjuvant chemotherapy /immunotherapy with paclitaxel and trastuzumab weekly started 05/10/2013, discontinued after 2 doses because of poor tolerance  (2) ventilation/perfusion scan 06/10/2013 documented right lower lobe pulmonary emboli; Doppler ultrasound 06/11/2013 documented bilateral lower extremity DVTs; started on warfarin managed through Dr. StoCarlyle Lipafice  (3) anastrozole started June 2015;   (4) adjuvant radiation completed 10/20/2013  (5) Trastuzumab resumed 09/07/2013,to be continued at least through March 2016; most recent echocardiogram 11/23/2013 showed a well preserved ejection fraction  (6) genetics testing April 2015 was normal and did not reveal a mutation in any of these genes: APC, ATM, AXIN2, BARD1, BMPRIA, BRCA1, BRCA2, BRIP1, CDH1, CDK4, CDKN2A, CHEK2, EPCAM, FANCC, MLH1, MSH2, MSH6, MUTYH, NBN, PALB2, PMS2, PTEN, RAD51C, SMAD4, STK11, TP53, VHL, and XRCC2  (7) osteopenia: DEXA scan 08/17/2013 showed a T score of -2.1-- started Prolia (denosumab)  01/11/2014  PLAN: Kamry is doing well today. The labs were reviewed in detail and were entirely stable. We reviewed the results of her chest CT which suggested the consolidation could be related to post radiation changes/fibrosis, but infection to the area in question could not be excluded. I consulted with Dr. MagJana Hakimd he would like a repeat chest Xray in 3 months to track this finding.   Jniyah will continue to return every 3 weeks for labs, an office visit, and trastuzumab as scheduled. She will continue the anastrozole daily. Her appointment to discuss the chest xray will be in April. She understands and agrees with this plan. She knows the goal of treatment in her case is cure. She has been encouraged to call with any issues that might arise before her next visit here.   FerMarcelino DusterP   03/15/2014 4:25 PM

## 2014-03-15 NOTE — Patient Instructions (Signed)
[facility] Discharge Instructions for Patients Receiving Chemotherapy  Today you received the following chemotherapy agents:  herceptin  To help prevent nausea and vomiting after your treatment, we encourage you to take your nausea medication.  If you develop nausea and vomiting that is not controlled by your nausea medication, call the clinic.   BELOW ARE SYMPTOMS THAT SHOULD BE REPORTED IMMEDIATELY:  *FEVER GREATER THAN 100.5 F  *CHILLS WITH OR WITHOUT FEVER  NAUSEA AND VOMITING THAT IS NOT CONTROLLED WITH YOUR NAUSEA MEDICATION  *UNUSUAL SHORTNESS OF BREATH  *UNUSUAL BRUISING OR BLEEDING  TENDERNESS IN MOUTH AND THROAT WITH OR WITHOUT PRESENCE OF ULCERS  *URINARY PROBLEMS  *BOWEL PROBLEMS  UNUSUAL RASH Items with * indicate a potential emergency and should be followed up as soon as possible.  Feel free to call the clinic you have any questions or concerns. The clinic phone number is [phone].

## 2014-03-15 NOTE — Telephone Encounter (Signed)
per reply from MW MD was too late-pt was ok w/arrangment-MD aware of time and knew that trmt could not be move

## 2014-03-16 ENCOUNTER — Ambulatory Visit
Admission: RE | Admit: 2014-03-16 | Discharge: 2014-03-16 | Disposition: A | Discharge status: Home / Self Care | Payer: Medicare Other | Source: Ambulatory Visit | Attending: Radiation Oncology | Admitting: Radiation Oncology

## 2014-03-16 DIAGNOSIS — Z853 Personal history of malignant neoplasm of breast: Secondary | ICD-10-CM

## 2014-04-04 ENCOUNTER — Other Ambulatory Visit: Payer: Self-pay | Admitting: *Deleted

## 2014-04-04 DIAGNOSIS — C50411 Malignant neoplasm of upper-outer quadrant of right female breast: Secondary | ICD-10-CM

## 2014-04-05 ENCOUNTER — Encounter: Payer: Self-pay | Admitting: Nurse Practitioner

## 2014-04-05 ENCOUNTER — Other Ambulatory Visit (HOSPITAL_BASED_OUTPATIENT_CLINIC_OR_DEPARTMENT_OTHER): Payer: Medicare Other

## 2014-04-05 ENCOUNTER — Ambulatory Visit (HOSPITAL_BASED_OUTPATIENT_CLINIC_OR_DEPARTMENT_OTHER): Payer: Medicare Other

## 2014-04-05 ENCOUNTER — Telehealth: Payer: Self-pay | Admitting: Nurse Practitioner

## 2014-04-05 ENCOUNTER — Telehealth: Payer: Self-pay | Admitting: *Deleted

## 2014-04-05 ENCOUNTER — Ambulatory Visit (HOSPITAL_BASED_OUTPATIENT_CLINIC_OR_DEPARTMENT_OTHER): Payer: Medicare Other | Admitting: Nurse Practitioner

## 2014-04-05 VITALS — BP 148/53 | HR 76 | Temp 97.5°F | Resp 18 | Ht 59.0 in | Wt 165.1 lb

## 2014-04-05 DIAGNOSIS — C773 Secondary and unspecified malignant neoplasm of axilla and upper limb lymph nodes: Secondary | ICD-10-CM

## 2014-04-05 DIAGNOSIS — M8580 Other specified disorders of bone density and structure, unspecified site: Secondary | ICD-10-CM

## 2014-04-05 DIAGNOSIS — C50811 Malignant neoplasm of overlapping sites of right female breast: Secondary | ICD-10-CM

## 2014-04-05 DIAGNOSIS — C50411 Malignant neoplasm of upper-outer quadrant of right female breast: Secondary | ICD-10-CM

## 2014-04-05 DIAGNOSIS — Z5112 Encounter for antineoplastic immunotherapy: Secondary | ICD-10-CM

## 2014-04-05 LAB — COMPREHENSIVE METABOLIC PANEL (CC13)
ALT: 12 U/L (ref 0–55)
AST: 14 U/L (ref 5–34)
Albumin: 3.6 g/dL (ref 3.5–5.0)
Alkaline Phosphatase: 82 U/L (ref 40–150)
Anion Gap: 9 mEq/L (ref 3–11)
BUN: 69.7 mg/dL — ABNORMAL HIGH (ref 7.0–26.0)
CO2: 25 mEq/L (ref 22–29)
Calcium: 9 mg/dL (ref 8.4–10.4)
Chloride: 107 mEq/L (ref 98–109)
Creatinine: 1.9 mg/dL — ABNORMAL HIGH (ref 0.6–1.1)
EGFR: 24 mL/min/{1.73_m2} — ABNORMAL LOW (ref >90)
Glucose: 200 mg/dl — ABNORMAL HIGH (ref 70–140)
Potassium: 4.1 mEq/L (ref 3.5–5.1)
Sodium: 142 mEq/L (ref 136–145)
Total Bilirubin: 0.58 mg/dL (ref 0.20–1.20)
Total Protein: 6.5 g/dL (ref 6.4–8.3)

## 2014-04-05 LAB — CBC WITH DIFFERENTIAL/PLATELET
BASO%: 1 % (ref 0.0–2.0)
Basophils Absolute: 0 10*3/uL (ref 0.0–0.1)
EOS%: 3.2 % (ref 0.0–7.0)
Eosinophils Absolute: 0.1 10*3/uL (ref 0.0–0.5)
HCT: 31.9 % — ABNORMAL LOW (ref 34.8–46.6)
HGB: 10.1 g/dL — ABNORMAL LOW (ref 11.6–15.9)
LYMPH%: 20.9 % (ref 14.0–49.7)
MCH: 28.9 pg (ref 25.1–34.0)
MCHC: 31.5 g/dL (ref 31.5–36.0)
MCV: 91.8 fL (ref 79.5–101.0)
MONO#: 0.4 10*3/uL (ref 0.1–0.9)
MONO%: 9.5 % (ref 0.0–14.0)
NEUT#: 2.9 10*3/uL (ref 1.5–6.5)
NEUT%: 65.4 % (ref 38.4–76.8)
Platelets: 183 10*3/uL (ref 145–400)
RBC: 3.48 10*6/uL — ABNORMAL LOW (ref 3.70–5.45)
RDW: 14.4 % (ref 11.2–14.5)
WBC: 4.4 10*3/uL (ref 3.9–10.3)
lymph#: 0.9 10*3/uL (ref 0.9–3.3)

## 2014-04-05 MED ORDER — ACETAMINOPHEN 325 MG PO TABS
ORAL_TABLET | ORAL | Status: AC
  Filled 2014-04-05: qty 2

## 2014-04-05 MED ORDER — ACETAMINOPHEN 325 MG PO TABS
650.0000 mg | ORAL_TABLET | Freq: Once | ORAL | Status: AC
  Administered 2014-04-05: 650 mg via ORAL

## 2014-04-05 MED ORDER — TRASTUZUMAB CHEMO INJECTION 440 MG
6.0000 mg/kg | Freq: Once | INTRAVENOUS | Status: AC
  Administered 2014-04-05: 462 mg via INTRAVENOUS
  Filled 2014-04-05: qty 22

## 2014-04-05 MED ORDER — HEPARIN SOD (PORK) LOCK FLUSH 100 UNIT/ML IV SOLN
500.0000 [IU] | Freq: Once | INTRAVENOUS | Status: AC | PRN
  Administered 2014-04-05: 500 [IU]
  Filled 2014-04-05: qty 5

## 2014-04-05 MED ORDER — SODIUM CHLORIDE 0.9 % IV SOLN
Freq: Once | INTRAVENOUS | Status: AC
  Administered 2014-04-05: 11:00:00 via INTRAVENOUS

## 2014-04-05 MED ORDER — SODIUM CHLORIDE 0.9 % IJ SOLN
10.0000 mL | INTRAMUSCULAR | Status: DC | PRN
  Administered 2014-04-05: 10 mL
  Filled 2014-04-05: qty 10

## 2014-04-05 NOTE — Telephone Encounter (Signed)
Per staff message and POF I have scheduled appts. Advised scheduler of appts. [name]  

## 2014-04-05 NOTE — Progress Notes (Signed)
[facility]  [phone] [phone]) [phone]     ID: Sabrina [name] DOB: 03/20/1926  MR#: [id number]  IHK#:[id number]  PCP: Sabrina Argyle, MD GYN: SU: Sabrina [name] OTHER MD: Sabrina [name]  CHIEF COMPLAINT: Estrogen receptor positive breast cancer  CURRENT TREATMENT: Anastrozole, trastuzumab   BREAST CANCER HISTORY: From Sabrina [name]'s intake note 03/24/2013:  "Sabrina [name] is a 81 y.o. female. Who underwent a screening mammogram. She was found to have a right breast mass measuring 1 cm. There were no abnormalities on her physical exam. Patient had ultrasound performed that showed 1.3 x 1.2 x 1.0 cm hypoechoic mass in the upper outer quadrant of the right breast. In the right axilla there was an abnormal appearing lymph node measuring 1.65 1.4 x 0.8 cm. Both her biopsy. The lymph node was positive for metastatic carcinoma. The primary breast tumor biopsy showed invasive ductal carcinoma, grade 2, ER positive PR positive HER-2/neu equivocal with a proliferation marker Ki-67 15%.."  On 03/30/2013 the patient underwent right lumpectomy in right axillary lymph node sampling. The pathology ([id number]) showed a 1.4 cm invasive ductal carcinoma grade 3, present at the lateral and posterior margins. 3 axillary lymph nodes (non-sentinel lymph nodes) were sampled, one of which had a micrometastatic tumor deposits with extracapsular extension. Repeat HER-2 was positive, with a signals ratio of 2.54 and the number per cell of 3.55.  The patient was started on weekly Taxol and trastuzumab, but tolerated treatment poorly and develop bilateral DVTs and pulmonary embolism April 2015. At that time her chemotherapy was interrupted.  Her subsequent history is as detailed below.   INTERVAL HISTORY: Sabrina [name] returns today for followup of her breast cancer accompanied by her husband Sabrina [name]. She is due for trastuzumab today. She is also on anastrozole daily. She tolerates  both drugs well with few complaints. She denies hot flashes or vaginal changes. She has some knee pain, but this is not new or worsened since starting anastrozole.  REVIEW OF SYSTEMS: Sabrina [name] denies fevers, chills, nausea or vomiting. She is constipated when she forgets to take her stool softener. She has some shortness of breath with exertion and a mild cough in the morning, but she denies chest pain. She is fatigued easily, but can move around at home without stopping as long as she uses her cane. She is taking an extra half dose of lasix for her ankle swelling, but is not consistent with its usage. She is eating and drinking well. A detailed review of systems is otherwise negative.   PAST MEDICAL HISTORY: Past Medical History  Diagnosis Date  . Heart murmur     no known problems; states did not know she had murmur until age 57  . Immature cataract   . Arthritis     neck  . Wears partial dentures     lower  . Dental crowns present   . Hypertension     fluctuates, especially when stressed; has been on med. > 20 yr.  . Non-insulin dependent type 2 diabetes mellitus   . High cholesterol   . Chronic kidney disease (CKD), stage III (moderate)     nephrologist, Sabrina [name]  . Breast cancer 03/2013    right  . Radiation 09/06/13-10/20/13    Right Breast Cancer    PAST SURGICAL HISTORY: Past Surgical History  Procedure Laterality Date  . Tonsillectomy      as a child  . Breast surgery Right 11/1958    right breast biopsy  benign  . Dilation and curettage of uterus    . Breast lumpectomy with needle localization Right 03/30/2013    Procedure: BREAST LUMPECTOMY WITH NEEDLE LOCALIZATION;  Surgeon: Sabrina Bookbinder, MD;  Location: [facility];  Service: General;  Laterality: Right;  . Axillary lymph node dissection Right 03/30/2013    Procedure: AXILLARY LYMPH NODE DISSECTION;  Surgeon: Sabrina Bookbinder, MD;  Location: [facility];  Service: General;  Laterality: Right;  . Re-excision of breast  cancer,superior margins Right 04/15/2013    Procedure: RE-EXCISION OF RIGHT BREAST  MARGINS;  Surgeon: Sabrina Bookbinder, MD;  Location: [facility];  Service: General;  Laterality: Right;  . Portacath placement N/A 04/15/2013    Procedure: INSERTION PORT-A-CATH;  Surgeon: Sabrina Bookbinder, MD;  Location: [facility];  Service: General;  Laterality: N/A;    FAMILY HISTORY Family History  Problem Relation Age of Onset  . Pneumonia Mother   . Heart attack Father   . Breast cancer Other 12    niece  . Breast cancer Other 11    niece   the patient's father died in his 69s, in the setting of multiple medical problems. The patient's mother died in her 14s from pneumonia. The patient had 4 sisters, one of whom has died from complications of diabetes. She had a brother who died at 56 months. There is no history of breast cancer in the immediate family although the patient does have 2 nieces with breast cancer. The patient has been tested for the BRCA mutations and her genetics was normal  GYNECOLOGIC HISTORY:  No LMP recorded. Patient is postmenopausal. Menarche age 42, the patient is GX P0. She went through menopause in her 74s, took hormone replacement until she was in her 21s.   SOCIAL HISTORY:  Sabrina [name] worked as the [employer] and locally. She and her husband Sabrina [name] greatly enjoyed [employer]. Sabrina [name] worked for the [employer] in [place].    ADVANCED DIRECTIVES: In place   HEALTH MAINTENANCE: History  Substance Use Topics  . Smoking status: Never Smoker   . Smokeless tobacco: Never Used     Comment: only smoked 2 packs cigarettes total  . Alcohol Use: 0.0 oz/week     Comment: 2-3 glasses wine/week     Colonoscopy:  PAP:  Bone density:  Lipid panel:  No Known Allergies  Current Outpatient Prescriptions  Medication Sig Dispense Refill  . anastrozole (ARIMIDEX) 1 MG tablet Take 1 tablet (1 mg total) by mouth daily. 30 tablet 3  . atorvastatin  (LIPITOR) 10 MG tablet Take 10 mg by mouth daily.    . calcium citrate (CALCITRATE - DOSED IN MG ELEMENTAL CALCIUM) 950 MG tablet Take 200 mg of elemental calcium by mouth daily.    . calcium-vitamin D (OSCAL WITH D) 250-125 MG-UNIT per tablet Take 1 tablet by mouth 2 (two) times daily.    . cholecalciferol (VITAMIN D) 1000 UNITS tablet Take 1,000 Units by mouth daily.    . cloNIDine (CATAPRES) 0.2 MG tablet Take 0.2 mg by mouth 2 (two) times daily.    . furosemide (LASIX) 40 MG tablet Take 1 tab in AM and 1/2 tab in PM 60 tablet 6  . glipiZIDE (GLUCOTROL) 2.5 mg TABS tablet Take 0.5 tablets (2.5 mg total) by mouth 2 (two) times daily before a meal. 60 tablet 1  . glucose blood test strip Use as instructed 100 each 12  . glucose monitoring kit (FREESTYLE) monitoring kit 1 each by Does not apply  route as needed for other. 1 each 1  . IRON PO Take 65 mg by mouth daily.    [name] lidocaine-prilocaine (EMLA) cream Apply 1 - 2 hours to port -a-cath before being accessed. 30 g 6  . losartan (COZAAR) 25 MG tablet     . potassium chloride SA (K-DUR,KLOR-CON) 20 MEQ tablet TAKE 1 TABLET ONCE DAILY. (Patient taking differently: Pt takes this med Mon, Wed, Fri.) 30 tablet 6  . traMADol (ULTRAM) 50 MG tablet Take 1 tablet (50 mg total) by mouth 2 (two) times daily. 60 tablet 1  . verapamil (VERELAN PM) 240 MG 24 hr capsule Take 240 mg by mouth daily.     [name] warfarin (COUMADIN) 4 MG tablet Take 4 mg by mouth daily.     No current facility-administered medications for this visit.    OBJECTIVE: Elderly white woman In no acute distress  Filed Vitals:   04/05/14 0928  BP: 148/53  Pulse: 76  Temp: 97.5 F (36.4 C)  Resp: 18     Body mass index is 33.33 kg/(m^2).    ECOG FS:2 - Symptomatic, <50% confined to bed  Skin: warm, dry  HEENT: sclerae anicteric, conjunctivae pink, oropharynx clear. No thrush or mucositis.  Lymph Nodes: No cervical or supraclavicular lymphadenopathy  Lungs: clear to auscultation  bilaterally, no rales, wheezes, or rhonci  Heart: regular rate and rhythm  Abdomen: round, soft, non tender, positive bowel sounds  Musculoskeletal: No focal spinal tenderness, +1 edema to right leg Neuro: non focal, well oriented, positive affect  Breast: deferred   LAB RESULTS:  CMP     Component Value Date/Time   NA 142 04/05/2014 0915   NA 142 09/14/2013 0515   K 4.1 04/05/2014 0915   K 4.5 09/14/2013 0515   CL 102 09/14/2013 0515   CO2 25 04/05/2014 0915   CO2 30 09/14/2013 0515   GLUCOSE 200* 04/05/2014 0915   GLUCOSE 224* 09/14/2013 0515   BUN 69.7* 04/05/2014 0915   BUN 41* 09/14/2013 0515   CREATININE 1.9* 04/05/2014 0915   CREATININE 1.66* 09/14/2013 0515   CALCIUM 9.0 04/05/2014 0915   CALCIUM 9.2 09/14/2013 0515   PROT 6.5 04/05/2014 0915   PROT 5.9* 09/14/2013 0515   ALBUMIN 3.6 04/05/2014 0915   ALBUMIN 3.3* 09/14/2013 0515   AST 14 04/05/2014 0915   AST 10 09/14/2013 0515   ALT 12 04/05/2014 0915   ALT 13 09/14/2013 0515   ALKPHOS 82 04/05/2014 0915   ALKPHOS 72 09/14/2013 0515   BILITOT 0.58 04/05/2014 0915   BILITOT 0.7 09/14/2013 0515   GFRNONAA 27* 09/14/2013 0515   GFRAA 32* 09/14/2013 0515    I No results found for: SPEP  Lab Results  Component Value Date   WBC 4.4 04/05/2014   NEUTROABS 2.9 04/05/2014   HGB 10.1* 04/05/2014   HCT 31.9* 04/05/2014   MCV 91.8 04/05/2014   PLT 183 04/05/2014      Chemistry      Component Value Date/Time   NA 142 04/05/2014 0915   NA 142 09/14/2013 0515   K 4.1 04/05/2014 0915   K 4.5 09/14/2013 0515   CL 102 09/14/2013 0515   CO2 25 04/05/2014 0915   CO2 30 09/14/2013 0515   BUN 69.7* 04/05/2014 0915   BUN 41* 09/14/2013 0515   CREATININE 1.9* 04/05/2014 0915   CREATININE 1.66* 09/14/2013 0515      Component Value Date/Time   CALCIUM 9.0 04/05/2014 0915   CALCIUM 9.2 09/14/2013 0515  ALKPHOS 82 04/05/2014 0915   ALKPHOS 72 09/14/2013 0515   AST 14 04/05/2014 0915   AST 10 09/14/2013  0515   ALT 12 04/05/2014 0915   ALT 13 09/14/2013 0515   BILITOT 0.58 04/05/2014 0915   BILITOT 0.7 09/14/2013 0515       No results found for: LABCA2  No components found for: LABCA125  No results for input(s): INR in the last 168 hours.  Urinalysis    Component Value Date/Time   COLORURINE YELLOW 09/13/2013 0523   APPEARANCEUR CLEAR 09/13/2013 0523   LABSPEC 1.010 09/13/2013 0523   PHURINE 7.0 09/13/2013 0523   GLUCOSEU 100* 09/13/2013 0523   HGBUR NEGATIVE 09/13/2013 0523   BILIRUBINUR NEGATIVE 09/13/2013 0523   KETONESUR NEGATIVE 09/13/2013 0523   PROTEINUR NEGATIVE 09/13/2013 0523   UROBILINOGEN 0.2 09/13/2013 0523   NITRITE NEGATIVE 09/13/2013 0523   LEUKOCYTESUR MODERATE* 09/13/2013 0523    STUDIES: Most recent echocardiogram on 02/23/14 showed an EF of 55-60%  Ct Chest W Contrast  03/14/2014   CLINICAL DATA:  Subsequent encounter for right lung nodule seen on x-ray of 03/01/2014.  EXAM: CT CHEST WITH CONTRAST  TECHNIQUE: Multidetector CT imaging of the chest was performed during intravenous contrast administration.  CONTRAST:  83m OMNIPAQUE IOHEXOL 300 MG/ML  SOLN  COMPARISON:  Chest x-ray from 03/01/2014.  FINDINGS: Mediastinum / Lymph Nodes: Left Port-A-Cath tip is positioned at the proximal SVC level. There is no axillary lymphadenopathy. Tiny left thyroid nodules are evident. No mediastinal or hilar lymphadenopathy. Heart size is normal. Dense calcification is noted in the mitral annulus. No pericardial effusion.  Lungs / Pleura: Peripheral airspace consolidation is seen in the antero lateral right upper lobe with associated air bronchograms in mild bronchiectasis. Comparing today's scout image to the previous chest x-ray, the area of involves lung does not appear substantially changed.  MSK / Soft Tissues: Bone windows reveal no worrisome lytic or sclerotic osseous lesions.  Upper Abdomen: Densely calcified gallstones measure up to 18 mm in diameter. There is some  thickening of the left adrenal gland without a discrete nodule or mass.  IMPRESSION: Peripheral airspace consolidation in the anterolateral right upper lobe not substantially changed since the chest x-ray of 03/01/2014. That chest x-ray represented the first imaging performed after the patient completed radiation treatment for right breast cancer and may well reflect post radiation change/fibrosis. There is associated peripheral nodular area of airspace consolidation in the right middle lobe which may be related to the right upper lobe opacity but infection in this area cannot be excluded. Followup CT imaging could be used to ensure stability.   Electronically Signed   By: EMisty StanleyM.D.   On: 03/14/2014 16:49   Mm Digital Diagnostic Bilat  03/16/2014   CLINICAL DATA:  Right lumpectomy Save in January 2015. Patient had radiation therapy. She is on Arimidex and Herceptin.  EXAM: DIGITAL DIAGNOSTIC  BILATERAL MAMMOGRAM WITH CAD  COMPARISON:  03/17/2013 and earlier  ACR Breast Density Category b: There are scattered areas of fibroglandular density.  FINDINGS: Posttreatment changes are identified in the right breast. No suspicious mass, distortion, or microcalcifications are identified to suggest presence of malignancy.  Mammographic images were processed with CAD.  IMPRESSION: No mammographic evidence for malignancy.  RECOMMENDATION: Diagnostic mammogram is suggested in 1 year. (Code:DM-B-01Y)  I have discussed the findings and recommendations with the patient. Results were also provided in writing at the conclusion of the visit. If applicable, a reminder letter will be sent to  the patient regarding the next appointment.  BI-RADS CATEGORY  2: Benign.   Electronically Signed   By: Shon Hale M.D.   On: 03/16/2014 16:27    ASSESSMENT: 81 y.o.  woman status post right lumpectomy and axillary lymph node sampling 03/30/2013 for a pT1c pN1a, stage II 8 invasive ductal carcinoma, grade 3, estrogen  receptor 100% positive, progesterone receptor 100% positive, with an MIB-1 of 15%, and HER-2 amplified, with a signals ratio of 2.54 and the number per cell being 3.55.  (a) margins were positive but clear with additional surgery to 04/15/2013 [phone])  (1) adjuvant chemotherapy /immunotherapy with paclitaxel and trastuzumab weekly started 05/10/2013, discontinued after 2 doses because of poor tolerance  (2) ventilation/perfusion scan 06/10/2013 documented right lower lobe pulmonary emboli; Doppler ultrasound 06/11/2013 documented bilateral lower extremity DVTs; started on warfarin managed through Dr. Carlyle Lipa office  (3) anastrozole started June 2015;   (4) adjuvant radiation completed 10/20/2013  (5) Trastuzumab resumed 09/07/2013,to be continued at least through March 2016; most recent echocardiogram 11/23/2013 showed a well preserved ejection fraction  (6) genetics testing April 2015 was normal and did not reveal a mutation in any of these genes: APC, ATM, AXIN2, BARD1, BMPRIA, BRCA1, BRCA2, BRIP1, CDH1, CDK4, CDKN2A, CHEK2, EPCAM, FANCC, MLH1, MSH2, MSH6, MUTYH, NBN, PALB2, PMS2, PTEN, RAD51C, SMAD4, STK11, TP53, VHL, and XRCC2  (7) osteopenia: DEXA scan 08/17/2013 showed a T score of -2.1-- started Prolia (denosumab) 01/11/2014  PLAN: Bobby continues to do well. The labs were reviewed in detail and were stable. She will proceed with trastuzumab as scheduled today. She is tolerating the anastrozole well and will continue this daily for a total of 5 year of antiestrogen therapy.   Sabrina [name] will continue trastuzumab every 3 weeks. Though she started in March, she thinks she was told that it may continue into April because of some missed doses during an episode of pulmonary emboli. I have scheduled an additional dose on 4/5, which is whjen she is to follow up with Dr. Jana Hakim. If he deems it unnecessary, he can cancel it himself. She will have a chest xray prior to this visit as discussed in  the previous note. I have also placed orders for a repeat echo in March. Sabrina [name]'s next denosumab injection is due in May. She understands and agrees with this plan. She knows the goal of treatment in her case is cure. She has been encouraged to call with any issues that might arise before her next visit here.   Marcelino Duster, NP   04/05/2014 10:02 AM

## 2014-04-05 NOTE — Patient Instructions (Signed)
[facility] Discharge Instructions for Patients Receiving Chemotherapy  Today you received the following chemotherapy agents herceptin   To help prevent nausea and vomiting after your treatment, we encourage you to take your nausea medication as directed   If you develop nausea and vomiting that is not controlled by your nausea medication, call the clinic.   BELOW ARE SYMPTOMS THAT SHOULD BE REPORTED IMMEDIATELY:  *FEVER GREATER THAN 100.5 F  *CHILLS WITH OR WITHOUT FEVER  NAUSEA AND VOMITING THAT IS NOT CONTROLLED WITH YOUR NAUSEA MEDICATION  *UNUSUAL SHORTNESS OF BREATH  *UNUSUAL BRUISING OR BLEEDING  TENDERNESS IN MOUTH AND THROAT WITH OR WITHOUT PRESENCE OF ULCERS  *URINARY PROBLEMS  *BOWEL PROBLEMS  UNUSUAL RASH Items with * indicate a potential emergency and should be followed up as soon as possible.  Feel free to call the clinic you have any questions or concerns. The clinic phone number is [phone].  

## 2014-04-05 NOTE — Telephone Encounter (Signed)
, °

## 2014-04-06 ENCOUNTER — Telehealth: Payer: Self-pay | Admitting: Nurse Practitioner

## 2014-04-06 NOTE — Telephone Encounter (Signed)
, °

## 2014-04-07 ENCOUNTER — Other Ambulatory Visit: Payer: Self-pay | Admitting: Oncology

## 2014-04-16 ENCOUNTER — Encounter (HOSPITAL_COMMUNITY): Payer: Self-pay | Admitting: *Deleted

## 2014-04-16 ENCOUNTER — Emergency Department (HOSPITAL_COMMUNITY)
Admission: EM | Admit: 2014-04-16 | Discharge: 2014-04-17 | Disposition: A | Discharge status: Home / Self Care | Payer: Medicare Other | Attending: Emergency Medicine | Admitting: Emergency Medicine

## 2014-04-16 DIAGNOSIS — R011 Cardiac murmur, unspecified: Secondary | ICD-10-CM | POA: Insufficient documentation

## 2014-04-16 DIAGNOSIS — I129 Hypertensive chronic kidney disease with stage 1 through stage 4 chronic kidney disease, or unspecified chronic kidney disease: Secondary | ICD-10-CM | POA: Insufficient documentation

## 2014-04-16 DIAGNOSIS — Z79899 Other long term (current) drug therapy: Secondary | ICD-10-CM | POA: Diagnosis not present

## 2014-04-16 DIAGNOSIS — E780 Pure hypercholesterolemia: Secondary | ICD-10-CM | POA: Diagnosis not present

## 2014-04-16 DIAGNOSIS — N183 Chronic kidney disease, stage 3 (moderate): Secondary | ICD-10-CM | POA: Insufficient documentation

## 2014-04-16 DIAGNOSIS — R040 Epistaxis: Secondary | ICD-10-CM | POA: Insufficient documentation

## 2014-04-16 DIAGNOSIS — E119 Type 2 diabetes mellitus without complications: Secondary | ICD-10-CM | POA: Insufficient documentation

## 2014-04-16 DIAGNOSIS — Z8739 Personal history of other diseases of the musculoskeletal system and connective tissue: Secondary | ICD-10-CM | POA: Diagnosis not present

## 2014-04-16 DIAGNOSIS — Z8669 Personal history of other diseases of the nervous system and sense organs: Secondary | ICD-10-CM | POA: Insufficient documentation

## 2014-04-16 DIAGNOSIS — Z853 Personal history of malignant neoplasm of breast: Secondary | ICD-10-CM | POA: Insufficient documentation

## 2014-04-16 DIAGNOSIS — Z7901 Long term (current) use of anticoagulants: Secondary | ICD-10-CM | POA: Diagnosis not present

## 2014-04-16 LAB — CBC WITH DIFFERENTIAL/PLATELET
Basophils Absolute: 0 10*3/uL (ref 0.0–0.1)
Basophils Relative: 0 % (ref 0–1)
Eosinophils Absolute: 0.1 10*3/uL (ref 0.0–0.7)
Eosinophils Relative: 2 % (ref 0–5)
HCT: 32.6 % — ABNORMAL LOW (ref 36.0–46.0)
Hemoglobin: 10.4 g/dL — ABNORMAL LOW (ref 12.0–15.0)
Lymphocytes Relative: 12 % (ref 12–46)
Lymphs Abs: 0.8 10*3/uL (ref 0.7–4.0)
MCH: 29.4 pg (ref 26.0–34.0)
MCHC: 31.9 g/dL (ref 30.0–36.0)
MCV: 92.1 fL (ref 78.0–100.0)
Monocytes Absolute: 0.4 10*3/uL (ref 0.1–1.0)
Monocytes Relative: 6 % (ref 3–12)
Neutro Abs: 5.7 10*3/uL (ref 1.7–7.7)
Neutrophils Relative %: 81 % — ABNORMAL HIGH (ref 43–77)
Platelets: 191 10*3/uL (ref 150–400)
RBC: 3.54 MIL/uL — ABNORMAL LOW (ref 3.87–5.11)
RDW: 13.7 % (ref 11.5–15.5)
WBC: 7.1 10*3/uL (ref 4.0–10.5)

## 2014-04-16 NOTE — ED Notes (Signed)
Pt reports nose bleed since 2000, EMS came out and inserted a gauze in her L nare and instructed pt to take the gauze out after an hour.  Pt reports it started bleeding and it hasnt stopped since.  Pt reports she is on blood thinner.  Pt is shaky and reports feeling anxious.  Pt is A&Ox 4.  Denies feeling dizzy at this time.

## 2014-04-17 LAB — PROTIME-INR
INR: 1.79 — ABNORMAL HIGH (ref 0.00–1.49)
Prothrombin Time: 20.9 seconds — ABNORMAL HIGH (ref 11.6–15.2)

## 2014-04-17 NOTE — ED Notes (Signed)
Awake. Verbally responsive. A/O x4. Resp even and unlabored. No audible adventitious breath sounds noted. ABC's intact.  

## 2014-04-17 NOTE — ED Provider Notes (Signed)
CSN: [id number]     Arrival date & time 04/16/14  2251 History   First MD Initiated Contact with Patient 04/16/14 2303     Chief Complaint  Patient presents with  . Epistaxis     (Consider location/radiation/quality/duration/timing/severity/associated sxs/prior Treatment) HPI  Patient states this morning she noted a spot in her left nostril that she accidentally hit this afternoon and it started bleeding. She was able to get it to stop. However about 8 PM it restarted again. She called EMS and they put some gauze in her nose and told her to remove it in about an hour. She states she did remove it about 10 PM however it started bleeding again. Patient states she's never had nosebleeds before. She is on Coumadin and is supposed to have her INR rechecked in 2 days. She states the last time it was checked it was 2.13 weeks ago. Patient is on Coumadin due to history of PE and DVT in April 2015. She reports they do have gas heat in the house. She is currently getting chemotherapy for breast cancer. She states she has 2-3 more treatments to go.  PCP Dr Felipa [facility] Dr Griffith Citron  Past Medical History  Diagnosis Date  . Heart murmur     no known problems; states did not know she had murmur until age 38  . Immature cataract   . Arthritis     neck  . Wears partial dentures     lower  . Dental crowns present   . Hypertension     fluctuates, especially when stressed; has been on med. > 20 yr.  . Non-insulin dependent type 2 diabetes mellitus   . High cholesterol   . Chronic kidney disease (CKD), stage III (moderate)     nephrologist, Dr. [name]  . Breast cancer 03/2013    right  . Radiation 09/06/13-10/20/13    Right Breast Cancer   Past Surgical History  Procedure Laterality Date  . Tonsillectomy      as a child  . Breast surgery Right 11/1958    right breast biopsy benign  . Dilation and curettage of uterus    . Breast lumpectomy with needle localization Right 03/30/2013     Procedure: BREAST LUMPECTOMY WITH NEEDLE LOCALIZATION;  Surgeon: Rolm Bookbinder, MD;  Location: [place];  Service: General;  Laterality: Right;  . Axillary lymph node dissection Right 03/30/2013    Procedure: AXILLARY LYMPH NODE DISSECTION;  Surgeon: Rolm Bookbinder, MD;  Location: [place];  Service: General;  Laterality: Right;  . Re-excision of breast cancer,superior margins Right 04/15/2013    Procedure: RE-EXCISION OF RIGHT BREAST  MARGINS;  Surgeon: Rolm Bookbinder, MD;  Location: [place];  Service: General;  Laterality: Right;  . Portacath placement N/A 04/15/2013    Procedure: INSERTION PORT-A-CATH;  Surgeon: Rolm Bookbinder, MD;  Location: [place];  Service: General;  Laterality: N/A;   Family History  Problem Relation Age of Onset  . Pneumonia Mother   . Heart attack Father   . Breast cancer Other 92    niece  . Breast cancer Other 67    niece   History  Substance Use Topics  . Smoking status: Never Smoker   . Smokeless tobacco: Never Used     Comment: only smoked 2 packs cigarettes total  . Alcohol Use: 0.0 oz/week     Comment: 2-3 glasses wine/week   Lives at home Lives with spouse  OB History    Obstetric  Comments   Menarche ae 14 No children Married 31 years     Review of Systems  All other systems reviewed and are negative.     Allergies  Review of patient's allergies indicates no known allergies.  Home Medications   Prior to Admission medications   Medication Sig Start Date End Date Taking? Authorizing Provider  anastrozole (ARIMIDEX) 1 MG tablet TAKE 1 TABLET ONCE DAILY. 04/08/14  Yes Chauncey Cruel, MD  atorvastatin (LIPITOR) 10 MG tablet Take 10 mg by mouth daily.   Yes Historical Provider, MD  calcium-vitamin D (OSCAL WITH D) 250-125 MG-UNIT per tablet Take 1 tablet by mouth 2 (two) times daily.   Yes Historical Provider, MD  cholecalciferol (VITAMIN D) 1000 UNITS tablet Take 1,000 Units by mouth daily.    Yes Historical Provider, MD  cloNIDine (CATAPRES) 0.2 MG tablet Take 0.2 mg by mouth 2 (two) times daily.   Yes Historical Provider, MD  furosemide (LASIX) 40 MG tablet Take 1 tab in AM and 1/2 tab in PM Patient taking differently: Take 20-40 mg by mouth daily. Take 1 tab in AM and 1/2 tab in PM 02/23/14  Yes Jolaine Artist, MD  glipiZIDE (GLUCOTROL) 2.5 mg TABS tablet Take 0.5 tablets (2.5 mg total) by mouth 2 (two) times daily before a meal. 09/14/13  Yes Kinnie Feil, MD  glucose blood test strip Use as instructed 09/14/13  Yes Kinnie Feil, MD  IRON PO Take 65 mg by mouth daily.   Yes Historical Provider, MD  lidocaine-prilocaine (EMLA) cream Apply 1 - 2 hours to port -a-cath before being accessed. 05/03/13  Yes Deatra Robinson, MD  losartan (COZAAR) 25 MG tablet Take 25 mg by mouth daily.  11/02/13  Yes Historical Provider, MD  potassium chloride SA (K-DUR,KLOR-CON) 20 MEQ tablet TAKE 1 TABLET ONCE DAILY. Patient taking differently: Pt takes this med Mon, Wed, Fri. 11/25/13  Yes Jolaine Artist, MD  traMADol (ULTRAM) 50 MG tablet Take 1 tablet (50 mg total) by mouth 2 (two) times daily. 06/19/13  Yes Theodis Blaze, MD  verapamil (VERELAN PM) 240 MG 24 hr capsule Take 240 mg by mouth daily.    Yes Historical Provider, MD  warfarin (COUMADIN) 4 MG tablet Take 4 mg by mouth daily.   Yes Historical Provider, MD  glucose monitoring kit (FREESTYLE) monitoring kit 1 each by Does not apply route as needed for other. 09/14/13   Kinnie Feil, MD   BP 108/68 mmHg  Pulse 99  Temp(Src) 97.7 F (36.5 C) (Oral)  Resp 18  SpO2 100%  Vital signs normal   Physical Exam  Constitutional: She is oriented to person, place, and time. She appears well-developed and well-nourished.  Non-toxic appearance. She does not appear ill. No distress.  HENT:  Head: Normocephalic and atraumatic.  Right Ear: External ear normal.  Left Ear: External ear normal.  Nose: Nose normal. No mucosal edema or  rhinorrhea.  Mouth/Throat: Oropharynx is clear and moist and mucous membranes are normal. No dental abscesses or uvula swelling.  Patient has blood soaked gauze in her left nostril. She is using a kleenex to dab at it for mild continued bleeding.  Eyes: Conjunctivae and EOM are normal. Pupils are equal, round, and reactive to light.  Neck: Normal range of motion and full passive range of motion without pain. Neck supple.  Pulmonary/Chest: Effort normal. No respiratory distress. She has no rhonchi. She exhibits no crepitus.  Abdominal: Normal appearance.  Musculoskeletal:  Normal range of motion. She exhibits no edema or tenderness.  Moves all extremities well.   Neurological: She is alert and oriented to person, place, and time. She has normal strength. No cranial nerve deficit.  Skin: Skin is warm, dry and intact. No rash noted. No erythema. No pallor.  Psychiatric: She has a normal mood and affect. Her speech is normal and behavior is normal. Her mood appears not anxious.  Nursing note and vitals reviewed.   ED Course  Procedures (including critical care time)  The nursing staff had difficulty finding the silver nitrate sticks. Finally about 2330 I removed the gauze from her nose. There was a small flesh-colored round lesion at the opening of the left nares that she states was the bleeding site. I do not see any active bleeding from it. I did put the cautery stick on it. However when I visualize her nares there were 2 spots on the nasal septum that had fresh blood on them. That area was also cauterized.  Recheck at 0100 patient has not been bleeding since she had the cautery done. I'm going to have nursing staff get her up and ambulate her. Hopefully she will not have rebleeding.  Patient ambulated by nursing staff and had no further episodes of bleeding.   Labs Review Results for orders placed or performed during the hospital encounter of 04/16/14  CBC with Differential  Result Value  Ref Range   WBC 7.1 4.0 - 10.5 K/uL   RBC 3.54 (L) 3.87 - 5.11 MIL/uL   Hemoglobin 10.4 (L) 12.0 - 15.0 g/dL   HCT 32.6 (L) 36.0 - 46.0 %   MCV 92.1 78.0 - 100.0 fL   MCH 29.4 26.0 - 34.0 pg   MCHC 31.9 30.0 - 36.0 g/dL   RDW 13.7 11.5 - 15.5 %   Platelets 191 150 - 400 K/uL   Neutrophils Relative % 81 (H) 43 - 77 %   Neutro Abs 5.7 1.7 - 7.7 K/uL   Lymphocytes Relative 12 12 - 46 %   Lymphs Abs 0.8 0.7 - 4.0 K/uL   Monocytes Relative 6 3 - 12 %   Monocytes Absolute 0.4 0.1 - 1.0 K/uL   Eosinophils Relative 2 0 - 5 %   Eosinophils Absolute 0.1 0.0 - 0.7 K/uL   Basophils Relative 0 0 - 1 %   Basophils Absolute 0.0 0.0 - 0.1 K/uL  Protime-INR  Result Value Ref Range   Prothrombin Time 20.9 (H) 11.6 - 15.2 seconds   INR 1.79 (H) 0.00 - 1.49   Laboratory interpretation all normal except subtherapeutic INR, stable anemia     Imaging Review No results found.   EKG Interpretation None      MDM   Final diagnoses:  Left-sided epistaxis    Plan discharge  Rolland Porter, MD, Alanson Aly, MD 04/17/14 [phone]

## 2014-04-17 NOTE — Discharge Instructions (Signed)
Try not to sneeze or blow your nose for a couple of days. If it starts bleeding again squeeze your nose like a clothespin or put direct pressure on the area for 10 minutes to stop the bleeding. Your INR was 1.79 tonight. You can let Dr Carlyle Lipa office know that on Monday, Feb 15th. Recheck if the bleeding won't stop and we may need to put in packing.    Nosebleed Nosebleeds can be caused by many conditions, including trauma, infections, polyps, foreign bodies, dry mucous membranes or climate, medicines, and air conditioning. Most nosebleeds occur in the front of the nose. Because of this location, most nosebleeds can be controlled by pinching the nostrils gently and continuously for at least 10 to 20 minutes. The long, continuous pressure allows enough time for the blood to clot. If pressure is released during that 10 to 20 minute time period, the process may have to be started again. The nosebleed may stop by itself or quit with pressure, or it may need concentrated heating (cautery) or pressure from packing. HOME CARE INSTRUCTIONS   If your nose was packed, try to maintain the pack inside until your health care provider removes it. If a gauze pack was used and it starts to fall out, gently replace it or cut the end off. Do not cut if a balloon catheter was used to pack the nose. Otherwise, do not remove unless instructed.  Avoid blowing your nose for 12 hours after treatment. This could dislodge the pack or clot and start the bleeding again.  If the bleeding starts again, sit up and bend forward, gently pinching the front half of your nose continuously for 20 minutes.  If bleeding was caused by dry mucous membranes, use over-the-counter saline nasal spray or gel. This will keep the mucous membranes moist and allow them to heal. If you must use a lubricant, choose the water-soluble variety. Use it only sparingly and not within several hours of lying down.  Do not use petroleum jelly or mineral  oil, as these may drip into the lungs and cause serious problems.  Maintain humidity in your home by using less air conditioning or by using a humidifier.  Do not use aspirin or medicines which make bleeding more likely. Your health care provider can give you recommendations on this.  Resume normal activities as you are able, but try to avoid straining, lifting, or bending at the waist for several days.  If the nosebleeds become recurrent and the cause is unknown, your health care provider may suggest laboratory tests. SEEK MEDICAL CARE IF: You have a fever. SEEK IMMEDIATE MEDICAL CARE IF:   Bleeding recurs and cannot be controlled.  There is unusual bleeding from or bruising on other parts of the body.  Nosebleeds continue.  There is any worsening of the condition which originally brought you in.  You become light-headed, feel faint, become sweaty, or vomit blood. MAKE SURE YOU:   Understand these instructions.  Will watch your condition.  Will get help right away if you are not doing well or get worse. Document Released: 11/28/2004 Document Revised: 07/05/2013 Document Reviewed: 01/19/2009 [facility] Patient Information 2015 [place], [place]. This information is not intended to replace advice given to you by your health care provider. Make sure you discuss any questions you have with your health care provider.

## 2014-04-17 NOTE — ED Notes (Signed)
Pt ambulated from room to bathroom then back to room. No complaints of dizziness or nose bleed.  Stand by assist only

## 2014-04-22 NOTE — Telephone Encounter (Signed)
None

## 2014-04-25 ENCOUNTER — Other Ambulatory Visit: Payer: Self-pay | Admitting: *Deleted

## 2014-04-25 DIAGNOSIS — C50411 Malignant neoplasm of upper-outer quadrant of right female breast: Secondary | ICD-10-CM

## 2014-04-26 ENCOUNTER — Ambulatory Visit (HOSPITAL_BASED_OUTPATIENT_CLINIC_OR_DEPARTMENT_OTHER): Payer: Medicare Other

## 2014-04-26 ENCOUNTER — Other Ambulatory Visit (HOSPITAL_BASED_OUTPATIENT_CLINIC_OR_DEPARTMENT_OTHER): Payer: Medicare Other

## 2014-04-26 DIAGNOSIS — C50411 Malignant neoplasm of upper-outer quadrant of right female breast: Secondary | ICD-10-CM

## 2014-04-26 DIAGNOSIS — Z5112 Encounter for antineoplastic immunotherapy: Secondary | ICD-10-CM

## 2014-04-26 LAB — CBC WITH DIFFERENTIAL/PLATELET
BASO%: 0.2 % (ref 0.0–2.0)
Basophils Absolute: 0 10*3/uL (ref 0.0–0.1)
EOS%: 3.2 % (ref 0.0–7.0)
Eosinophils Absolute: 0.2 10*3/uL (ref 0.0–0.5)
HCT: 31.8 % — ABNORMAL LOW (ref 34.8–46.6)
HGB: 10.3 g/dL — ABNORMAL LOW (ref 11.6–15.9)
LYMPH%: 20.4 % (ref 14.0–49.7)
MCH: 30 pg (ref 25.1–34.0)
MCHC: 32.4 g/dL (ref 31.5–36.0)
MCV: 92.7 fL (ref 79.5–101.0)
MONO#: 0.3 10*3/uL (ref 0.1–0.9)
MONO%: 7.1 % (ref 0.0–14.0)
NEUT#: 3.3 10*3/uL (ref 1.5–6.5)
NEUT%: 69.1 % (ref 38.4–76.8)
Platelets: 164 10*3/uL (ref 145–400)
RBC: 3.43 10*6/uL — ABNORMAL LOW (ref 3.70–5.45)
RDW: 14.2 % (ref 11.2–14.5)
WBC: 4.8 10*3/uL (ref 3.9–10.3)
lymph#: 1 10*3/uL (ref 0.9–3.3)

## 2014-04-26 LAB — COMPREHENSIVE METABOLIC PANEL (CC13)
ALT: 12 U/L (ref 0–55)
AST: 15 U/L (ref 5–34)
Albumin: 3.7 g/dL (ref 3.5–5.0)
Alkaline Phosphatase: 91 U/L (ref 40–150)
Anion Gap: 11 mEq/L (ref 3–11)
BUN: 59.2 mg/dL — ABNORMAL HIGH (ref 7.0–26.0)
CO2: 26 mEq/L (ref 22–29)
Calcium: 8.6 mg/dL (ref 8.4–10.4)
Chloride: 106 mEq/L (ref 98–109)
Creatinine: 2 mg/dL — ABNORMAL HIGH (ref 0.6–1.1)
EGFR: 22 mL/min/{1.73_m2} — ABNORMAL LOW (ref >90)
Glucose: 244 mg/dl — ABNORMAL HIGH (ref 70–140)
Potassium: 4.3 mEq/L (ref 3.5–5.1)
Sodium: 142 mEq/L (ref 136–145)
Total Bilirubin: 0.42 mg/dL (ref 0.20–1.20)
Total Protein: 6.7 g/dL (ref 6.4–8.3)

## 2014-04-26 MED ORDER — TRASTUZUMAB CHEMO INJECTION 440 MG
6.0000 mg/kg | Freq: Once | INTRAVENOUS | Status: AC
  Administered 2014-04-26: 462 mg via INTRAVENOUS
  Filled 2014-04-26: qty 22

## 2014-04-26 MED ORDER — HEPARIN SOD (PORK) LOCK FLUSH 100 UNIT/ML IV SOLN
500.0000 [IU] | Freq: Once | INTRAVENOUS | Status: AC | PRN
  Administered 2014-04-26: 500 [IU]
  Filled 2014-04-26: qty 5

## 2014-04-26 MED ORDER — SODIUM CHLORIDE 0.9 % IV SOLN
Freq: Once | INTRAVENOUS | Status: AC
  Administered 2014-04-26: 11:00:00 via INTRAVENOUS

## 2014-04-26 MED ORDER — SODIUM CHLORIDE 0.9 % IJ SOLN
10.0000 mL | INTRAMUSCULAR | Status: DC | PRN
  Administered 2014-04-26: 10 mL
  Filled 2014-04-26: qty 10

## 2014-04-26 MED ORDER — ACETAMINOPHEN 325 MG PO TABS
650.0000 mg | ORAL_TABLET | Freq: Once | ORAL | Status: AC
  Administered 2014-04-26: 650 mg via ORAL

## 2014-04-26 MED ORDER — ACETAMINOPHEN 325 MG PO TABS
ORAL_TABLET | ORAL | Status: AC
  Filled 2014-04-26: qty 2

## 2014-04-26 NOTE — Patient Instructions (Signed)
[facility]cer Center Discharge Instructions for Patients Receiving Chemotherapy  Today you received the following chemotherapy agents herceptin   To help prevent nausea and vomiting after your treatment, we encourage you to take your nausea medication as directed   If you develop nausea and vomiting that is not controlled by your nausea medication, call the clinic.   BELOW ARE SYMPTOMS THAT SHOULD BE REPORTED IMMEDIATELY:  *FEVER GREATER THAN 100.5 F  *CHILLS WITH OR WITHOUT FEVER  NAUSEA AND VOMITING THAT IS NOT CONTROLLED WITH YOUR NAUSEA MEDICATION  *UNUSUAL SHORTNESS OF BREATH  *UNUSUAL BRUISING OR BLEEDING  TENDERNESS IN MOUTH AND THROAT WITH OR WITHOUT PRESENCE OF ULCERS  *URINARY PROBLEMS  *BOWEL PROBLEMS  UNUSUAL RASH Items with * indicate a potential emergency and should be followed up as soon as possible.  Feel free to call the clinic you have any questions or concerns. The clinic phone number is [phone].  

## 2014-04-26 NOTE — Progress Notes (Signed)
Patient refused compazine.  She does not have nausea with the herceptin.

## 2014-05-16 ENCOUNTER — Other Ambulatory Visit: Payer: Self-pay | Admitting: Oncology

## 2014-05-17 ENCOUNTER — Ambulatory Visit (HOSPITAL_BASED_OUTPATIENT_CLINIC_OR_DEPARTMENT_OTHER): Payer: Medicare Other

## 2014-05-17 ENCOUNTER — Other Ambulatory Visit (HOSPITAL_BASED_OUTPATIENT_CLINIC_OR_DEPARTMENT_OTHER): Payer: Medicare Other

## 2014-05-17 VITALS — BP 144/42 | HR 69 | Temp 97.4°F | Resp 18

## 2014-05-17 DIAGNOSIS — Z5112 Encounter for antineoplastic immunotherapy: Secondary | ICD-10-CM

## 2014-05-17 DIAGNOSIS — C50411 Malignant neoplasm of upper-outer quadrant of right female breast: Secondary | ICD-10-CM

## 2014-05-17 DIAGNOSIS — C50911 Malignant neoplasm of unspecified site of right female breast: Secondary | ICD-10-CM

## 2014-05-17 LAB — CBC WITH DIFFERENTIAL/PLATELET
BASO%: 0.7 % (ref 0.0–2.0)
Basophils Absolute: 0 10*3/uL (ref 0.0–0.1)
EOS%: 3.3 % (ref 0.0–7.0)
Eosinophils Absolute: 0.2 10*3/uL (ref 0.0–0.5)
HCT: 32.6 % — ABNORMAL LOW (ref 34.8–46.6)
HGB: 10.5 g/dL — ABNORMAL LOW (ref 11.6–15.9)
LYMPH%: 25.5 % (ref 14.0–49.7)
MCH: 30 pg (ref 25.1–34.0)
MCHC: 32.2 g/dL (ref 31.5–36.0)
MCV: 93.1 fL (ref 79.5–101.0)
MONO#: 0.3 10*3/uL (ref 0.1–0.9)
MONO%: 7.3 % (ref 0.0–14.0)
NEUT#: 2.9 10*3/uL (ref 1.5–6.5)
NEUT%: 63.2 % (ref 38.4–76.8)
Platelets: 160 10*3/uL (ref 145–400)
RBC: 3.5 10*6/uL — ABNORMAL LOW (ref 3.70–5.45)
RDW: 14 % (ref 11.2–14.5)
WBC: 4.6 10*3/uL (ref 3.9–10.3)
lymph#: 1.2 10*3/uL (ref 0.9–3.3)

## 2014-05-17 LAB — COMPREHENSIVE METABOLIC PANEL (CC13)
ALT: 15 U/L (ref 0–55)
AST: 15 U/L (ref 5–34)
Albumin: 3.6 g/dL (ref 3.5–5.0)
Alkaline Phosphatase: 85 U/L (ref 40–150)
Anion Gap: 9 mEq/L (ref 3–11)
BUN: 57.1 mg/dL — ABNORMAL HIGH (ref 7.0–26.0)
CO2: 26 mEq/L (ref 22–29)
Calcium: 8.6 mg/dL (ref 8.4–10.4)
Chloride: 108 mEq/L (ref 98–109)
Creatinine: 2 mg/dL — ABNORMAL HIGH (ref 0.6–1.1)
EGFR: 22 mL/min/{1.73_m2} — ABNORMAL LOW (ref >90)
Glucose: 234 mg/dl — ABNORMAL HIGH (ref 70–140)
Potassium: 4.3 mEq/L (ref 3.5–5.1)
Sodium: 142 mEq/L (ref 136–145)
Total Bilirubin: 0.4 mg/dL (ref 0.20–1.20)
Total Protein: 6.7 g/dL (ref 6.4–8.3)

## 2014-05-17 MED ORDER — SODIUM CHLORIDE 0.9 % IJ SOLN
10.0000 mL | INTRAMUSCULAR | Status: DC | PRN
  Administered 2014-05-17: 10 mL
  Filled 2014-05-17: qty 10

## 2014-05-17 MED ORDER — SODIUM CHLORIDE 0.9 % IV SOLN
Freq: Once | INTRAVENOUS | Status: AC
  Administered 2014-05-17: 10:00:00 via INTRAVENOUS

## 2014-05-17 MED ORDER — SODIUM CHLORIDE 0.9 % IV SOLN
6.0000 mg/kg | Freq: Once | INTRAVENOUS | Status: AC
  Administered 2014-05-17: 462 mg via INTRAVENOUS
  Filled 2014-05-17: qty 22

## 2014-05-17 MED ORDER — ACETAMINOPHEN 325 MG PO TABS
650.0000 mg | ORAL_TABLET | Freq: Once | ORAL | Status: AC
  Administered 2014-05-17: 650 mg via ORAL

## 2014-05-17 MED ORDER — ACETAMINOPHEN 325 MG PO TABS
ORAL_TABLET | ORAL | Status: AC
  Filled 2014-05-17: qty 2

## 2014-05-17 MED ORDER — HEPARIN SOD (PORK) LOCK FLUSH 100 UNIT/ML IV SOLN
500.0000 [IU] | Freq: Once | INTRAVENOUS | Status: AC | PRN
  Administered 2014-05-17: 500 [IU]
  Filled 2014-05-17: qty 5

## 2014-05-17 NOTE — Progress Notes (Signed)
Pt refused Compazine today.

## 2014-05-17 NOTE — Patient Instructions (Signed)
[facility] Discharge Instructions for Patients Receiving Chemotherapy  Today you received the following chemotherapy agents Herceptin.  To help prevent nausea and vomiting after your treatment, we encourage you to take your nausea medication as prescribed.   If you develop nausea and vomiting that is not controlled by your nausea medication, call the clinic.   BELOW ARE SYMPTOMS THAT SHOULD BE REPORTED IMMEDIATELY:  *FEVER GREATER THAN 100.5 F  *CHILLS WITH OR WITHOUT FEVER  NAUSEA AND VOMITING THAT IS NOT CONTROLLED WITH YOUR NAUSEA MEDICATION  *UNUSUAL SHORTNESS OF BREATH  *UNUSUAL BRUISING OR BLEEDING  TENDERNESS IN MOUTH AND THROAT WITH OR WITHOUT PRESENCE OF ULCERS  *URINARY PROBLEMS  *BOWEL PROBLEMS  UNUSUAL RASH Items with * indicate a potential emergency and should be followed up as soon as possible.  Feel free to call the clinic you have any questions or concerns. The clinic phone number is [phone].    

## 2014-05-25 ENCOUNTER — Ambulatory Visit (HOSPITAL_COMMUNITY)
Admission: RE | Admit: 2014-05-25 | Discharge: 2014-05-25 | Disposition: A | Discharge status: Home / Self Care | Payer: Medicare Other | Source: Ambulatory Visit | Attending: Nurse Practitioner | Admitting: Nurse Practitioner

## 2014-05-25 DIAGNOSIS — C50411 Malignant neoplasm of upper-outer quadrant of right female breast: Secondary | ICD-10-CM | POA: Insufficient documentation

## 2014-05-25 DIAGNOSIS — Z79899 Other long term (current) drug therapy: Secondary | ICD-10-CM | POA: Diagnosis not present

## 2014-05-25 DIAGNOSIS — Z5111 Encounter for antineoplastic chemotherapy: Secondary | ICD-10-CM | POA: Diagnosis not present

## 2014-05-25 DIAGNOSIS — I10 Essential (primary) hypertension: Secondary | ICD-10-CM | POA: Insufficient documentation

## 2014-05-25 NOTE — Progress Notes (Signed)
Echocardiogram 2D Echocardiogram limited has been performed.  Sabrina [name] 05/25/2014, 12:43 PM

## 2014-05-31 ENCOUNTER — Other Ambulatory Visit: Payer: Self-pay | Admitting: Nurse Practitioner

## 2014-05-31 ENCOUNTER — Telehealth: Payer: Self-pay | Admitting: *Deleted

## 2014-05-31 DIAGNOSIS — C50411 Malignant neoplasm of upper-outer quadrant of right female breast: Secondary | ICD-10-CM

## 2014-05-31 NOTE — Telephone Encounter (Signed)
Pt called wanting to know what she needs to do about getting the Chest Xray . I told pt to come to [facility] Radiology dept at her convenience to get Xray. I did tell her she will need to get it before she sees Dr. Jana Hakim on 06/07/14. Message to be forwarded to Gentry Fitz, NP. Pt verbalized understanding. No further concerns.

## 2014-06-01 ENCOUNTER — Ambulatory Visit (HOSPITAL_COMMUNITY)
Admission: RE | Admit: 2014-06-01 | Discharge: 2014-06-01 | Disposition: A | Discharge status: Home / Self Care | Payer: Medicare Other | Source: Ambulatory Visit | Attending: Nurse Practitioner | Admitting: Nurse Practitioner

## 2014-06-01 DIAGNOSIS — C50411 Malignant neoplasm of upper-outer quadrant of right female breast: Secondary | ICD-10-CM | POA: Diagnosis present

## 2014-06-07 ENCOUNTER — Other Ambulatory Visit (HOSPITAL_BASED_OUTPATIENT_CLINIC_OR_DEPARTMENT_OTHER): Payer: Medicare Other

## 2014-06-07 ENCOUNTER — Ambulatory Visit (HOSPITAL_BASED_OUTPATIENT_CLINIC_OR_DEPARTMENT_OTHER): Payer: Medicare Other

## 2014-06-07 ENCOUNTER — Telehealth: Payer: Self-pay | Admitting: Oncology

## 2014-06-07 ENCOUNTER — Ambulatory Visit (HOSPITAL_BASED_OUTPATIENT_CLINIC_OR_DEPARTMENT_OTHER): Payer: Medicare Other | Admitting: Oncology

## 2014-06-07 VITALS — BP 145/58 | HR 65 | Temp 97.0°F | Resp 18 | Ht 59.0 in | Wt 167.8 lb

## 2014-06-07 DIAGNOSIS — I350 Nonrheumatic aortic (valve) stenosis: Secondary | ICD-10-CM

## 2014-06-07 DIAGNOSIS — Z79811 Long term (current) use of aromatase inhibitors: Secondary | ICD-10-CM | POA: Diagnosis not present

## 2014-06-07 DIAGNOSIS — C50411 Malignant neoplasm of upper-outer quadrant of right female breast: Secondary | ICD-10-CM

## 2014-06-07 DIAGNOSIS — I10 Essential (primary) hypertension: Secondary | ICD-10-CM

## 2014-06-07 DIAGNOSIS — Z5112 Encounter for antineoplastic immunotherapy: Secondary | ICD-10-CM

## 2014-06-07 DIAGNOSIS — Z86718 Personal history of other venous thrombosis and embolism: Secondary | ICD-10-CM

## 2014-06-07 DIAGNOSIS — Z170 Estrogen receptor positive status [ER+]: Secondary | ICD-10-CM

## 2014-06-07 DIAGNOSIS — M8589 Other specified disorders of bone density and structure, multiple sites: Secondary | ICD-10-CM

## 2014-06-07 DIAGNOSIS — I5032 Chronic diastolic (congestive) heart failure: Secondary | ICD-10-CM

## 2014-06-07 DIAGNOSIS — Z7901 Long term (current) use of anticoagulants: Secondary | ICD-10-CM

## 2014-06-07 DIAGNOSIS — I2699 Other pulmonary embolism without acute cor pulmonale: Secondary | ICD-10-CM

## 2014-06-07 DIAGNOSIS — J9601 Acute respiratory failure with hypoxia: Secondary | ICD-10-CM

## 2014-06-07 DIAGNOSIS — Z86711 Personal history of pulmonary embolism: Secondary | ICD-10-CM

## 2014-06-07 LAB — CBC WITH DIFFERENTIAL/PLATELET
BASO%: 0.4 % (ref 0.0–2.0)
Basophils Absolute: 0 10*3/uL (ref 0.0–0.1)
EOS%: 3.2 % (ref 0.0–7.0)
Eosinophils Absolute: 0.2 10*3/uL (ref 0.0–0.5)
HCT: 32.3 % — ABNORMAL LOW (ref 34.8–46.6)
HGB: 10.4 g/dL — ABNORMAL LOW (ref 11.6–15.9)
LYMPH%: 19.3 % (ref 14.0–49.7)
MCH: 29.9 pg (ref 25.1–34.0)
MCHC: 32.2 g/dL (ref 31.5–36.0)
MCV: 92.8 fL (ref 79.5–101.0)
MONO#: 0.3 10*3/uL (ref 0.1–0.9)
MONO%: 7 % (ref 0.0–14.0)
NEUT#: 3.3 10*3/uL (ref 1.5–6.5)
NEUT%: 70.1 % (ref 38.4–76.8)
Platelets: 148 10*3/uL (ref 145–400)
RBC: 3.48 10*6/uL — ABNORMAL LOW (ref 3.70–5.45)
RDW: 13.9 % (ref 11.2–14.5)
WBC: 4.7 10*3/uL (ref 3.9–10.3)
lymph#: 0.9 10*3/uL (ref 0.9–3.3)

## 2014-06-07 LAB — COMPREHENSIVE METABOLIC PANEL (CC13)
ALT: 14 U/L (ref 0–55)
AST: 15 U/L (ref 5–34)
Albumin: 3.6 g/dL (ref 3.5–5.0)
Alkaline Phosphatase: 80 U/L (ref 40–150)
Anion Gap: 10 mEq/L (ref 3–11)
BUN: 60.5 mg/dL — ABNORMAL HIGH (ref 7.0–26.0)
CO2: 25 mEq/L (ref 22–29)
Calcium: 8.3 mg/dL — ABNORMAL LOW (ref 8.4–10.4)
Chloride: 110 mEq/L — ABNORMAL HIGH (ref 98–109)
Creatinine: 2 mg/dL — ABNORMAL HIGH (ref 0.6–1.1)
EGFR: 23 mL/min/{1.73_m2} — ABNORMAL LOW (ref >90)
Glucose: 232 mg/dl — ABNORMAL HIGH (ref 70–140)
Potassium: 4.4 mEq/L (ref 3.5–5.1)
Sodium: 145 mEq/L (ref 136–145)
Total Bilirubin: 0.52 mg/dL (ref 0.20–1.20)
Total Protein: 6.5 g/dL (ref 6.4–8.3)

## 2014-06-07 MED ORDER — ANASTROZOLE 1 MG PO TABS: 1.0000 mg | ORAL_TABLET | Freq: Every day | ORAL | Status: DC

## 2014-06-07 MED ORDER — PROCHLORPERAZINE EDISYLATE 5 MG/ML IJ SOLN
5.0000 mg | Freq: Four times a day (QID) | INTRAMUSCULAR | Status: DC | PRN
  Administered 2014-06-07: 5 mg via INTRAVENOUS

## 2014-06-07 MED ORDER — PROCHLORPERAZINE EDISYLATE 5 MG/ML IJ SOLN
INTRAMUSCULAR | Status: AC
  Filled 2014-06-07: qty 2

## 2014-06-07 MED ORDER — ACETAMINOPHEN 325 MG PO TABS
ORAL_TABLET | ORAL | Status: AC
  Filled 2014-06-07: qty 2

## 2014-06-07 MED ORDER — SODIUM CHLORIDE 0.9 % IJ SOLN
10.0000 mL | INTRAMUSCULAR | Status: DC | PRN
  Administered 2014-06-07: 10 mL
  Filled 2014-06-07: qty 10

## 2014-06-07 MED ORDER — TRASTUZUMAB CHEMO INJECTION 440 MG
6.0000 mg/kg | Freq: Once | INTRAVENOUS | Status: AC
  Administered 2014-06-07: 462 mg via INTRAVENOUS
  Filled 2014-06-07: qty 22

## 2014-06-07 MED ORDER — HEPARIN SOD (PORK) LOCK FLUSH 100 UNIT/ML IV SOLN
500.0000 [IU] | Freq: Once | INTRAVENOUS | Status: AC | PRN
  Administered 2014-06-07: 500 [IU]
  Filled 2014-06-07: qty 5

## 2014-06-07 MED ORDER — ACETAMINOPHEN 325 MG PO TABS
650.0000 mg | ORAL_TABLET | Freq: Once | ORAL | Status: AC
  Administered 2014-06-07: 650 mg via ORAL

## 2014-06-07 MED ORDER — SODIUM CHLORIDE 0.9 % IV SOLN
Freq: Once | INTRAVENOUS | Status: AC
  Administered 2014-06-07: 12:00:00 via INTRAVENOUS

## 2014-06-07 NOTE — Progress Notes (Signed)
[place]  [phone] [phone]) [phone]     ID: Sabrina [name] DOB: 1926/03/10  MR#: [id number]  JYN#:[id number]  PCP: Sabrina Argyle, MD GYN: SU: Sabrina [name] OTHER MD: Sabrina [name]  CHIEF COMPLAINT: Estrogen receptor positive breast cancer  CURRENT TREATMENT: Anastrozole, denosumab   BREAST CANCER HISTORY: From Sabrina [name]'s intake note 03/24/2013:  "Sabrina [name] is a 81 y.o. female. Who underwent a screening mammogram. She was found to have a right breast mass measuring 1 cm. There were no abnormalities on her physical exam. Patient had ultrasound performed that showed 1.3 x 1.2 x 1.0 cm hypoechoic mass in the upper outer quadrant of the right breast. In the right axilla there was an abnormal appearing lymph node measuring 1.65 1.4 x 0.8 cm. Both her biopsy. The lymph node was positive for metastatic carcinoma. The primary breast tumor biopsy showed invasive ductal carcinoma, grade 2, ER positive PR positive HER-2/neu equivocal with a proliferation marker Ki-67 15%.."  On 03/30/2013 the patient underwent right lumpectomy in right axillary lymph node sampling. The pathology ([id number]) showed a 1.4 cm invasive ductal carcinoma grade 3, present at the lateral and posterior margins. 3 axillary lymph nodes (non-sentinel lymph nodes) were sampled, one of which had a micrometastatic tumor deposits with extracapsular extension. Repeat HER-2 was positive, with a signals ratio of 2.54 and the number per cell of 3.55.  The patient was started on weekly Taxol and trastuzumab, but tolerated treatment poorly and develop bilateral DVTs and pulmonary embolism April 2015. At that time her chemotherapy was interrupted.  Her subsequent history is as detailed below.   INTERVAL HISTORY: Sabrina [name] returns today for followup of her breast cancer accompanied by her husband Sabrina [name]. She completes a year of trastuzumab today. She just had a repeat echocardiogram, which  shows a well preserved ejection fraction. Also she continues on anastrozole. She tolerated with no significant side effects. She obtains good price.  REVIEW OF SYSTEMS: Sabrina [name] has had some epistaxis problems partly due to the Coumadin and partly to sinus/allergies issues. She ended up in the emergency room in February and had some cautery done under Dr. Redmond Baseman. There have been no other bleeding issues. There have been no falls. She still has some allergy symptoms, but these are well controlled. She feels short of breath particularly when walking up stairs or climbing up a slope. She has some arthritis pains here in there which are not or intense or persistent than before. A detailed review of systems today was otherwise stable.  PAST MEDICAL HISTORY: Past Medical History  Diagnosis Date  . Heart murmur     no known problems; states did not know she had murmur until age 48  . Immature cataract   . Arthritis     neck  . Wears partial dentures     lower  . Dental crowns present   . Hypertension     fluctuates, especially when stressed; has been on med. > 20 yr.  . Non-insulin dependent type 2 diabetes mellitus   . High cholesterol   . Chronic kidney disease (CKD), stage III (moderate)     nephrologist, Dr. Corliss [name]  . Breast cancer 03/2013    right  . Radiation 09/06/13-10/20/13    Right Breast Cancer    PAST SURGICAL HISTORY: Past Surgical History  Procedure Laterality Date  . Tonsillectomy      as a child  . Breast surgery Right 11/1958    right breast biopsy benign  .  Dilation and curettage of uterus    . Breast lumpectomy with needle localization Right 03/30/2013    Procedure: BREAST LUMPECTOMY WITH NEEDLE LOCALIZATION;  Surgeon: Sabrina Bookbinder, MD;  Location: [facility];  Service: General;  Laterality: Right;  . Axillary lymph node dissection Right 03/30/2013    Procedure: AXILLARY LYMPH NODE DISSECTION;  Surgeon: Sabrina Bookbinder, MD;  Location: [facility];  Service: General;   Laterality: Right;  . Re-excision of breast cancer,superior margins Right 04/15/2013    Procedure: RE-EXCISION OF RIGHT BREAST  MARGINS;  Surgeon: Sabrina Bookbinder, MD;  Location: [facility];  Service: General;  Laterality: Right;  . Portacath placement N/A 04/15/2013    Procedure: INSERTION PORT-A-CATH;  Surgeon: Sabrina Bookbinder, MD;  Location: [facility];  Service: General;  Laterality: N/A;    FAMILY HISTORY Family History  Problem Relation Age of Onset  . Pneumonia Mother   . Heart attack Father   . Breast cancer Other 80    niece  . Breast cancer Other 68    niece   the patient's father died in his 83s, in the setting of multiple medical problems. The patient's mother died in her 17s from pneumonia. The patient had 4 sisters, one of whom has died from complications of diabetes. She had a brother who died at 30 months. There is no history of breast cancer in the immediate family although the patient does have 2 nieces with breast cancer. The patient has been tested for the BRCA mutations and her genetics was normal  GYNECOLOGIC HISTORY:  No LMP recorded. Patient is postmenopausal. Menarche age 89, the patient is GX P0. She went through menopause in her 57s, took hormone replacement until she was in her 85s.   SOCIAL HISTORY:  Sabrina [name] worked as the [employer] and locally. She and her husband Sabrina [name] greatly enjoyed [employer] [place]. Sabrina [name] worked for the [employer] in [place].    ADVANCED DIRECTIVES: In place   HEALTH MAINTENANCE: History  Substance Use Topics  . Smoking status: Never Smoker   . Smokeless tobacco: Never Used     Comment: only smoked 2 packs cigarettes total  . Alcohol Use: 0.0 oz/week     Comment: 2-3 glasses wine/week     Colonoscopy:  PAP:  Bone density:  Lipid panel:  No Known Allergies  Current Outpatient Prescriptions  Medication Sig Dispense Refill  . anastrozole (ARIMIDEX) 1 MG tablet TAKE 1 TABLET ONCE DAILY. 30  tablet 3  . atorvastatin (LIPITOR) 10 MG tablet Take 10 mg by mouth daily.    . calcium-vitamin D (OSCAL WITH D) 250-125 MG-UNIT per tablet Take 1 tablet by mouth 2 (two) times daily.    . cholecalciferol (VITAMIN D) 1000 UNITS tablet Take 1,000 Units by mouth daily.    . cloNIDine (CATAPRES) 0.2 MG tablet Take 0.2 mg by mouth 2 (two) times daily.    . furosemide (LASIX) 40 MG tablet Take 1 tab in AM and 1/2 tab in PM (Patient taking differently: Take 20-40 mg by mouth daily. Take 1 tab in AM and 1/2 tab in PM) 60 tablet 6  . glipiZIDE (GLUCOTROL) 2.5 mg TABS tablet Take 0.5 tablets (2.5 mg total) by mouth 2 (two) times daily before a meal. 60 tablet 1  . glucose blood test strip Use as instructed 100 each 12  . glucose monitoring kit (FREESTYLE) monitoring kit 1 each by Does not apply route as needed for other. 1 each 1  . IRON PO Take  65 mg by mouth daily.    [name] lidocaine-prilocaine (EMLA) cream Apply 1 - 2 hours to port -a-cath before being accessed. 30 g 6  . losartan (COZAAR) 25 MG tablet Take 25 mg by mouth daily.     . potassium chloride SA (K-DUR,KLOR-CON) 20 MEQ tablet TAKE 1 TABLET ONCE DAILY. (Patient taking differently: Pt takes this med Mon, Wed, Fri.) 30 tablet 6  . traMADol (ULTRAM) 50 MG tablet Take 1 tablet (50 mg total) by mouth 2 (two) times daily. 60 tablet 1  . verapamil (VERELAN PM) 240 MG 24 hr capsule Take 240 mg by mouth daily.     [name] warfarin (COUMADIN) 4 MG tablet Take 4 mg by mouth daily.     No current facility-administered medications for this visit.    OBJECTIVE: Elderly white woman who appears stated age  89 Vitals:   06/07/14 1018  BP: 145/58  Pulse: 65  Temp: 97 F (36.1 C)  Resp: 18     Body mass index is 33.87 kg/(m^2).    ECOG FS:1 - Symptomatic but completely ambulatory  Sclerae unicteric, EOMs intact Oropharynx clear and moist-- no thrush or other lesions No cervical or supraclavicular adenopathy Lungs no rales or rhonchi Heart regular rate  and rhythm Abd soft, obese, nontender, positive bowel sounds MSK no focal spinal tenderness, no upper extremity lymphedema Neuro: nonfocal, well oriented, appropriate affect Breasts: The right breast is status post lumpectomy and radiation. There is no evidence of local recurrence. The right axilla is benign. The left breast is unremarkable   LAB RESULTS:  CMP     Component Value Date/Time   NA 142 05/17/2014 0918   NA 142 09/14/2013 0515   K 4.3 05/17/2014 0918   K 4.5 09/14/2013 0515   CL 102 09/14/2013 0515   CO2 26 05/17/2014 0918   CO2 30 09/14/2013 0515   GLUCOSE 234* 05/17/2014 0918   GLUCOSE 224* 09/14/2013 0515   BUN 57.1* 05/17/2014 0918   BUN 41* 09/14/2013 0515   CREATININE 2.0* 05/17/2014 0918   CREATININE 1.66* 09/14/2013 0515   CALCIUM 8.6 05/17/2014 0918   CALCIUM 9.2 09/14/2013 0515   PROT 6.7 05/17/2014 0918   PROT 5.9* 09/14/2013 0515   ALBUMIN 3.6 05/17/2014 0918   ALBUMIN 3.3* 09/14/2013 0515   AST 15 05/17/2014 0918   AST 10 09/14/2013 0515   ALT 15 05/17/2014 0918   ALT 13 09/14/2013 0515   ALKPHOS 85 05/17/2014 0918   ALKPHOS 72 09/14/2013 0515   BILITOT 0.40 05/17/2014 0918   BILITOT 0.7 09/14/2013 0515   GFRNONAA 27* 09/14/2013 0515   GFRAA 32* 09/14/2013 0515    I No results found for: SPEP  Lab Results  Component Value Date   WBC 4.7 06/07/2014   NEUTROABS 3.3 06/07/2014   HGB 10.4* 06/07/2014   HCT 32.3* 06/07/2014   MCV 92.8 06/07/2014   PLT 148 06/07/2014      Chemistry      Component Value Date/Time   NA 142 05/17/2014 0918   NA 142 09/14/2013 0515   K 4.3 05/17/2014 0918   K 4.5 09/14/2013 0515   CL 102 09/14/2013 0515   CO2 26 05/17/2014 0918   CO2 30 09/14/2013 0515   BUN 57.1* 05/17/2014 0918   BUN 41* 09/14/2013 0515   CREATININE 2.0* 05/17/2014 0918   CREATININE 1.66* 09/14/2013 0515      Component Value Date/Time   CALCIUM 8.6 05/17/2014 0918   CALCIUM 9.2 09/14/2013 0515  ALKPHOS 85 05/17/2014 0918    ALKPHOS 72 09/14/2013 0515   AST 15 05/17/2014 0918   AST 10 09/14/2013 0515   ALT 15 05/17/2014 0918   ALT 13 09/14/2013 0515   BILITOT 0.40 05/17/2014 0918   BILITOT 0.7 09/14/2013 0515       No results found for: LABCA2  No components found for: OTLXB262  No results for input(s): INR in the last 168 hours.  Urinalysis    Component Value Date/Time   COLORURINE YELLOW 09/13/2013 0523   APPEARANCEUR CLEAR 09/13/2013 0523   LABSPEC 1.010 09/13/2013 0523   PHURINE 7.0 09/13/2013 0523   GLUCOSEU 100* 09/13/2013 0523   HGBUR NEGATIVE 09/13/2013 0523   BILIRUBINUR NEGATIVE 09/13/2013 0523   KETONESUR NEGATIVE 09/13/2013 0523   PROTEINUR NEGATIVE 09/13/2013 0523   UROBILINOGEN 0.2 09/13/2013 0523   NITRITE NEGATIVE 09/13/2013 0523   LEUKOCYTESUR MODERATE* 09/13/2013 0523    STUDIES: Most recent echocardiogram on 02/23/14 showed an EF of 55-60%  Dg Chest 2 View  06/01/2014   CLINICAL DATA:  Breast cancer of upper-outer quadrant of right female breast. F/u abnormal xray on 03/01/14 and CT scan on 03/14/14. Htn. Radiation July/August 2015.  EXAM: CHEST  2 VIEW  COMPARISON:  Chest CT, 03/14/2014 and chest radiograph, 06/07/2013.  FINDINGS: Hazy in coarse reticular opacity in the right upper lobe is new from the prior chest radiograph, was present on the prior chest CT, where it appears somewhat less confluent currently.  Remainder of the lungs is clear. No pleural effusion or pneumothorax. Cardiac silhouette is normal in size. No mediastinal or hilar masses. Mild retraction of the right hilum superiorly similar to the prior CT.  Left anterior chest wall Port-A-Cath is stable with its tip in the mid superior vena cava.  Bony thorax is demineralized. No osteoblastic or osteolytic lesions.  IMPRESSION: 1. No acute cardiopulmonary disease. 2. Radiation induced scarring in the right upper lobe.   Electronically Signed   By: [name] M.D.   On: 06/01/2014 14:30  Transthoracic  Echocardiography  Patient:  Ocia, Simek MR #:    [id number] Study Date: 05/25/2014 Gender:   F Age:    11 Height:   149.9 cm Weight:   72.6 kg BSA:    1.77 m^2 Pt. Status: Room:  SONOGRAPHER Tresa Res, RDCS PERFORMING  [facility], Outpatient ATTENDING  Boelter, [place], Heather F REFERRING  Boelter, Heather F  cc:  ------------------------------------------------------------------- LV EF: 55% -  60%  ASSESSMENT: 81 y.o. [place] woman status post right lumpectomy and axillary lymph node sampling 03/30/2013 for a pT1c pN1a, stage IIA invasive ductal carcinoma, grade 3, estrogen receptor 100% positive, progesterone receptor 100% positive, with an MIB-1 of 15%, and HER-2 amplified, with a signals ratio of 2.54 and the number per cell being 3.55.  (a) margins were positive but cleared with additional surgery February 2015 ([id number])  (1) adjuvant chemotherapy /immunotherapy with paclitaxel and trastuzumab weekly started 05/10/2013, discontinued after 2 doses because of poor tolerance  (2) ventilation/perfusion scan 06/10/2013 documented right lower lobe pulmonary emboli; Doppler ultrasound 06/11/2013 documented bilateral lower extremity DVTs; started on warfarin managed through Dr. Carlyle Lipa office  (3) anastrozole started June 2015  (4) adjuvant radiation completed 10/20/2013  (5) Trastuzumab resumed 09/07/2013, continued through 06/07/2014;  final echocardiogram on 03/30/2016showed a well preserved ejection fraction  (6) genetics testing April 2015 was normal and did not reveal a mutation in any of these genes: APC, ATM, AXIN2, BARD1, BMPRIA, BRCA1,  BRCA2, BRIP1, CDH1, CDK4, CDKN2A, CHEK2, EPCAM, FANCC, MLH1, MSH2, MSH6, MUTYH, NBN, PALB2, PMS2, PTEN, RAD51C, SMAD4, STK11, TP53, VHL, and XRCC2  (7) osteopenia: DEXA scan 08/17/2013 showed a T score of -2.1-- started Prolia (denosumab) 01/11/2014  PLAN: Talitha has done  terrific with Herceptin and complete a year today. At this point I'm comfortable with her having her port removed. Of course she is on Coumadin. I have asked her to stop the Coumadin 3 days prior to the procedure (Dr. Cristal Generous office will call her with the date) and then resume at the next day assuming no bleeding has occurred. I don't think she needs a heparin bridge  We are continuing the denosumab (prolia) every 6 months for osteopenia and her next dose will be 07/04/2014  At this point we are going to start seeing her on an every three-month basis for another year and then every 6 months thereafter. The overall plan is for anastrozole for 5 years  Whisper has a good understanding of this plan. She agrees with it. She knows the goal of treatment in her case is cure. She will call with any problems that may develop before her next visit here.  Chauncey Cruel, MD   06/07/2014 10:28 AM

## 2014-06-07 NOTE — Patient Instructions (Signed)

## 2014-06-07 NOTE — Telephone Encounter (Signed)
per pof to sch pt appt-gave pt copy of sch °

## 2014-06-20 ENCOUNTER — Other Ambulatory Visit (HOSPITAL_COMMUNITY): Payer: Self-pay | Admitting: Internal Medicine

## 2014-07-04 ENCOUNTER — Ambulatory Visit (HOSPITAL_BASED_OUTPATIENT_CLINIC_OR_DEPARTMENT_OTHER): Payer: Medicare Other

## 2014-07-04 VITALS — BP 160/44 | HR 64 | Temp 97.6°F | Resp 18

## 2014-07-04 DIAGNOSIS — M8580 Other specified disorders of bone density and structure, unspecified site: Secondary | ICD-10-CM | POA: Diagnosis not present

## 2014-07-04 MED ORDER — DENOSUMAB 60 MG/ML ~~LOC~~ SOLN
60.0000 mg | Freq: Once | SUBCUTANEOUS | Status: AC
  Administered 2014-07-04: 60 mg via SUBCUTANEOUS
  Filled 2014-07-04: qty 1

## 2014-07-04 NOTE — Patient Instructions (Signed)
Denosumab injection What is this medicine? DENOSUMAB (den oh sue mab) slows bone breakdown. Prolia is used to treat osteoporosis in women after menopause and in men. Xgeva is used to prevent bone fractures and other bone problems caused by cancer bone metastases. Xgeva is also used to treat giant cell tumor of the bone. This medicine may be used for other purposes; ask your health care provider or pharmacist if you have questions. COMMON BRAND NAME(S): Prolia, XGEVA What should I tell my health care provider before I take this medicine? They need to know if you have any of these conditions: -dental disease -eczema -infection or history of infections -kidney disease or on dialysis -low blood calcium or vitamin D -malabsorption syndrome -scheduled to have surgery or tooth extraction -taking medicine that contains denosumab -thyroid or parathyroid disease -an unusual reaction to denosumab, other medicines, foods, dyes, or preservatives -pregnant or trying to get pregnant -breast-feeding How should I use this medicine? This medicine is for injection under the skin. It is given by a health care professional in a hospital or clinic setting. If you are getting Prolia, a special MedGuide will be given to you by the pharmacist with each prescription and refill. Be sure to read this information carefully each time. For Prolia, talk to your pediatrician regarding the use of this medicine in children. Special care may be needed. For Xgeva, talk to your pediatrician regarding the use of this medicine in children. While this drug may be prescribed for children as young as 13 years for selected conditions, precautions do apply. Overdosage: If you think you've taken too much of this medicine contact a poison control center or emergency room at once. Overdosage: If you think you have taken too much of this medicine contact a poison control center or emergency room at once. NOTE: This medicine is only for  you. Do not share this medicine with others. What if I miss a dose? It is important not to miss your dose. Call your doctor or health care professional if you are unable to keep an appointment. What may interact with this medicine? Do not take this medicine with any of the following medications: -other medicines containing denosumab This medicine may also interact with the following medications: -medicines that suppress the immune system -medicines that treat cancer -steroid medicines like prednisone or cortisone This list may not describe all possible interactions. Give your health care provider a list of all the medicines, herbs, non-prescription drugs, or dietary supplements you use. Also tell them if you smoke, drink alcohol, or use illegal drugs. Some items may interact with your medicine. What should I watch for while using this medicine? Visit your doctor or health care professional for regular checks on your progress. Your doctor or health care professional may order blood tests and other tests to see how you are doing. Call your doctor or health care professional if you get a cold or other infection while receiving this medicine. Do not treat yourself. This medicine may decrease your body's ability to fight infection. You should make sure you get enough calcium and vitamin D while you are taking this medicine, unless your doctor tells you not to. Discuss the foods you eat and the vitamins you take with your health care professional. See your dentist regularly. Brush and floss your teeth as directed. Before you have any dental work done, tell your dentist you are receiving this medicine. Do not become pregnant while taking this medicine or for 5 months after stopping   it. Women should inform their doctor if they wish to become pregnant or think they might be pregnant. There is a potential for serious side effects to an unborn child. Talk to your health care professional or pharmacist for more  information. What side effects may I notice from receiving this medicine? Side effects that you should report to your doctor or health care professional as soon as possible: -allergic reactions like skin rash, itching or hives, swelling of the face, lips, or tongue -breathing problems -chest pain -fast, irregular heartbeat -feeling faint or lightheaded, falls -fever, chills, or any other sign of infection -muscle spasms, tightening, or twitches -numbness or tingling -skin blisters or bumps, or is dry, peels, or red -slow healing or unexplained pain in the mouth or jaw -unusual bleeding or bruising Side effects that usually do not require medical attention (Report these to your doctor or health care professional if they continue or are bothersome.): -muscle pain -stomach upset, gas This list may not describe all possible side effects. Call your doctor for medical advice about side effects. You may report side effects to FDA at 1-800-FDA-1088. Where should I keep my medicine? This medicine is only given in a clinic, doctor's office, or other health care setting and will not be stored at home. NOTE: This sheet is a summary. It may not cover all possible information. If you have questions about this medicine, talk to your doctor, pharmacist, or health care provider.  2015, Elsevier/Gold Standard. (2011-08-19 12:37:47)  

## 2014-07-04 NOTE — Progress Notes (Signed)
Discussed med compliance with oral calcium. Pt states she has not been taking it everyday. Informed pt importance of compliance with medication. Pt states she will start taking it as prescribed.

## 2014-07-28 ENCOUNTER — Other Ambulatory Visit: Payer: Self-pay | Admitting: Oncology

## 2014-09-12 ENCOUNTER — Encounter: Payer: Self-pay | Admitting: Nurse Practitioner

## 2014-09-12 ENCOUNTER — Other Ambulatory Visit (HOSPITAL_BASED_OUTPATIENT_CLINIC_OR_DEPARTMENT_OTHER): Payer: Medicare Other

## 2014-09-12 ENCOUNTER — Telehealth: Payer: Self-pay | Admitting: Oncology

## 2014-09-12 ENCOUNTER — Ambulatory Visit: Payer: Medicare Other

## 2014-09-12 ENCOUNTER — Ambulatory Visit (HOSPITAL_BASED_OUTPATIENT_CLINIC_OR_DEPARTMENT_OTHER): Payer: Medicare Other | Admitting: Nurse Practitioner

## 2014-09-12 VITALS — BP 142/48 | HR 62 | Temp 97.7°F | Resp 18 | Ht 59.0 in | Wt 169.1 lb

## 2014-09-12 DIAGNOSIS — L989 Disorder of the skin and subcutaneous tissue, unspecified: Secondary | ICD-10-CM

## 2014-09-12 DIAGNOSIS — I82403 Acute embolism and thrombosis of unspecified deep veins of lower extremity, bilateral: Secondary | ICD-10-CM

## 2014-09-12 DIAGNOSIS — C773 Secondary and unspecified malignant neoplasm of axilla and upper limb lymph nodes: Secondary | ICD-10-CM | POA: Diagnosis not present

## 2014-09-12 DIAGNOSIS — M8580 Other specified disorders of bone density and structure, unspecified site: Secondary | ICD-10-CM

## 2014-09-12 DIAGNOSIS — Z170 Estrogen receptor positive status [ER+]: Secondary | ICD-10-CM

## 2014-09-12 DIAGNOSIS — C50411 Malignant neoplasm of upper-outer quadrant of right female breast: Secondary | ICD-10-CM

## 2014-09-12 DIAGNOSIS — Z86718 Personal history of other venous thrombosis and embolism: Secondary | ICD-10-CM

## 2014-09-12 DIAGNOSIS — D1720 Benign lipomatous neoplasm of skin and subcutaneous tissue of unspecified limb: Secondary | ICD-10-CM

## 2014-09-12 DIAGNOSIS — Z86711 Personal history of pulmonary embolism: Secondary | ICD-10-CM

## 2014-09-12 LAB — CBC WITH DIFFERENTIAL/PLATELET
BASO%: 0.7 % (ref 0.0–2.0)
Basophils Absolute: 0 10*3/uL (ref 0.0–0.1)
EOS%: 2.8 % (ref 0.0–7.0)
Eosinophils Absolute: 0.2 10*3/uL (ref 0.0–0.5)
HCT: 33.1 % — ABNORMAL LOW (ref 34.8–46.6)
HGB: 10.9 g/dL — ABNORMAL LOW (ref 11.6–15.9)
LYMPH%: 18.3 % (ref 14.0–49.7)
MCH: 30.2 pg (ref 25.1–34.0)
MCHC: 33 g/dL (ref 31.5–36.0)
MCV: 91.6 fL (ref 79.5–101.0)
MONO#: 0.4 10*3/uL (ref 0.1–0.9)
MONO%: 6.8 % (ref 0.0–14.0)
NEUT#: 4.2 10*3/uL (ref 1.5–6.5)
NEUT%: 71.4 % (ref 38.4–76.8)
Platelets: 164 10*3/uL (ref 145–400)
RBC: 3.61 10*6/uL — ABNORMAL LOW (ref 3.70–5.45)
RDW: 13.6 % (ref 11.2–14.5)
WBC: 5.9 10*3/uL (ref 3.9–10.3)
lymph#: 1.1 10*3/uL (ref 0.9–3.3)

## 2014-09-12 LAB — COMPREHENSIVE METABOLIC PANEL (CC13)
ALT: 13 U/L (ref 0–55)
AST: 15 U/L (ref 5–34)
Albumin: 3.7 g/dL (ref 3.5–5.0)
Alkaline Phosphatase: 93 U/L (ref 40–150)
Anion Gap: 8 mEq/L (ref 3–11)
BUN: 59.3 mg/dL — ABNORMAL HIGH (ref 7.0–26.0)
CO2: 27 mEq/L (ref 22–29)
Calcium: 9.1 mg/dL (ref 8.4–10.4)
Chloride: 106 mEq/L (ref 98–109)
Creatinine: 2.1 mg/dL — ABNORMAL HIGH (ref 0.6–1.1)
EGFR: 21 mL/min/{1.73_m2} — ABNORMAL LOW (ref >90)
Glucose: 228 mg/dl — ABNORMAL HIGH (ref 70–140)
Potassium: 4.5 mEq/L (ref 3.5–5.1)
Sodium: 141 mEq/L (ref 136–145)
Total Bilirubin: 0.58 mg/dL (ref 0.20–1.20)
Total Protein: 6.8 g/dL (ref 6.4–8.3)

## 2014-09-12 NOTE — Telephone Encounter (Signed)
Gave patient avs report and appointments for August and October  °

## 2014-09-12 NOTE — Progress Notes (Signed)
[place]  Telephone:([phone]) [phone] Fax:(336) [phone]     ID: TRENESE HAFT DOB: October 26, 1926  MR#: [id number]  JYN#:[id number]  PCP: [name], MD GYN: SU: Rolm Bookbinder OTHER MD: Thea Silversmith  CHIEF COMPLAINT: Estrogen receptor positive breast cancer  CURRENT TREATMENT: Anastrozole, denosumab   BREAST CANCER HISTORY: From Dr. Bernell List Khan's intake note 03/24/2013:  "Sabrina [name] is a 81 y.o. female. Who underwent a screening mammogram. She was found to have a right breast mass measuring 1 cm. There were no abnormalities on her physical exam. Patient had ultrasound performed that showed 1.3 x 1.2 x 1.0 cm hypoechoic mass in the upper outer quadrant of the right breast. In the right axilla there was an abnormal appearing lymph node measuring 1.65 1.4 x 0.8 cm. Both her biopsy. The lymph node was positive for metastatic carcinoma. The primary breast tumor biopsy showed invasive ductal carcinoma, grade 2, ER positive PR positive HER-2/neu equivocal with a proliferation marker Ki-67 15%.."  On 03/30/2013 the patient underwent right lumpectomy in right axillary lymph node sampling. The pathology ([id number]) showed a 1.4 cm invasive ductal carcinoma grade 3, present at the lateral and posterior margins. 3 axillary lymph nodes (non-sentinel lymph nodes) were sampled, one of which had a micrometastatic tumor deposits with extracapsular extension. Repeat HER-2 was positive, with a signals ratio of 2.54 and the number per cell of 3.55.  The patient was started on weekly Taxol and trastuzumab, but tolerated treatment poorly and develop bilateral DVTs and pulmonary embolism April 2015. At that time her chemotherapy was interrupted.  Her subsequent history is as detailed below.   INTERVAL HISTORY: Janavia returns today for follow up of her breast cancer accompanied by her husband [name]. She has been on anastrozole since June 25015 and is tolerating this drug well. She  denies hot flashes or vaginal changes. She has chronic osteoarthritis to her hands. It is maybe somewhat worse to her right thumb joint, but she decreased how often she takes the tramadol because she's afraid of the side effects. The interval history is generally unremarkable.  REVIEW OF SYSTEMS: Ellowyn denies fevers, chills, nausea, vomiting, or changes in bowel or bladder habits. She takes 2 colace capsules BID to stay regular. She is eating and drinking well. Her fingernails are brittle. She has an area to her mid left back that is itchy but there is no rash. It is possibly from her bra strap. She has a large "knot" to her inner right leg that she has noticed for the past few months since she lost weight. It does not hurt. She continues on coumadin, and has been on it for over 1 year. She wonders if she could come off of it soon so that NSAID therapy would become available to her. She has shortness of breath with exertion. She denies chest pain, cough, or palpitations. A detailed review of system is otherwise stable.  PAST MEDICAL HISTORY: Past Medical History  Diagnosis Date  . Heart murmur     no known problems; states did not know she had murmur until age 33  . Immature cataract   . Arthritis     neck  . Wears partial dentures     lower  . Dental crowns present   . Hypertension     fluctuates, especially when stressed; has been on med. > 20 yr.  . Non-insulin dependent type 2 diabetes mellitus   . High cholesterol   . Chronic kidney disease (CKD), stage III (  moderate)     nephrologist, Dr. [name]  . Breast cancer 03/2013    right  . Radiation 09/06/13-10/20/13    Right Breast Cancer    PAST SURGICAL HISTORY: Past Surgical History  Procedure Laterality Date  . Tonsillectomy      as a child  . Breast surgery Right 11/1958    right breast biopsy benign  . Dilation and curettage of uterus    . Breast lumpectomy with needle localization Right 03/30/2013    Procedure: BREAST  LUMPECTOMY WITH NEEDLE LOCALIZATION;  Surgeon: Rolm Bookbinder, MD;  Location: [place];  Service: General;  Laterality: Right;  . Axillary lymph node dissection Right 03/30/2013    Procedure: AXILLARY LYMPH NODE DISSECTION;  Surgeon: Rolm Bookbinder, MD;  Location: [place];  Service: General;  Laterality: Right;  . Re-excision of breast cancer,superior margins Right 04/15/2013    Procedure: RE-EXCISION OF RIGHT BREAST  MARGINS;  Surgeon: Rolm Bookbinder, MD;  Location: [place];  Service: General;  Laterality: Right;  . Portacath placement N/A 04/15/2013    Procedure: INSERTION PORT-A-CATH;  Surgeon: Rolm Bookbinder, MD;  Location: [place];  Service: General;  Laterality: N/A;    FAMILY HISTORY Family History  Problem Relation Age of Onset  . Pneumonia Mother   . Heart attack Father   . Breast cancer Other 12    niece  . Breast cancer Other 34    niece   the patient's father died in his 19s, in the setting of multiple medical problems. The patient's mother died in her 57s from pneumonia. The patient had 4 sisters, one of whom has died from complications of diabetes. She had a brother who died at 3 months. There is no history of breast cancer in the immediate family although the patient does have 2 nieces with breast cancer. The patient has been tested for the BRCA mutations and her genetics was normal  GYNECOLOGIC HISTORY:  No LMP recorded. Patient is postmenopausal. Menarche age 23, the patient is GX P0. She went through menopause in her 71s, took hormone replacement until she was in her 28s.   SOCIAL HISTORY:  Didi worked as the [employer] and locally. She and her husband [name] greatly enjoyed [employer] [place]. [name] worked for the [employer] in [place].    ADVANCED DIRECTIVES: In place   HEALTH MAINTENANCE: History  Substance Use Topics  . Smoking status: Never Smoker   . Smokeless tobacco: Never Used     Comment: only smoked 2 packs  cigarettes total  . Alcohol Use: 0.0 oz/week     Comment: 2-3 glasses wine/week     Colonoscopy:  PAP:  Bone density:  Lipid panel:  No Known Allergies  Current Outpatient Prescriptions  Medication Sig Dispense Refill  . anastrozole (ARIMIDEX) 1 MG tablet TAKE 1 TABLET ONCE DAILY. 30 tablet 12  . atorvastatin (LIPITOR) 10 MG tablet Take 10 mg by mouth daily.    [name] BIOTIN 5000 PO Take 1 tablet by mouth daily.    . calcium-vitamin D (OSCAL WITH D) 250-125 MG-UNIT per tablet Take 1 tablet by mouth 2 (two) times daily.    . cholecalciferol (VITAMIN D) 1000 UNITS tablet Take 1,000 Units by mouth daily.    . cloNIDine (CATAPRES) 0.2 MG tablet Take 0.2 mg by mouth 2 (two) times daily.    [name] docusate sodium (COLACE) 100 MG capsule Take 100 mg by mouth 2 (two) times daily as needed for mild constipation. Pt takes  2 tablets in am and 2 tablets in the pm    . furosemide (LASIX) 40 MG tablet Take one tab in the Am and 1/2 tab in the PM (Patient taking differently: Take one tab in the Am and 1/2 tab in the PM as needed when swelling) 60 tablet 2  . glipiZIDE (GLUCOTROL) 2.5 mg TABS tablet Take 0.5 tablets (2.5 mg total) by mouth 2 (two) times daily before a meal. 60 tablet 1  . glucose blood test strip Use as instructed 100 each 12  . glucose monitoring kit (FREESTYLE) monitoring kit 1 each by Does not apply route as needed for other. 1 each 1  . IRON PO Take 65 mg by mouth daily.    [name] losartan (COZAAR) 25 MG tablet Take 25 mg by mouth daily.     . potassium chloride SA (K-DUR,KLOR-CON) 20 MEQ tablet TAKE 1 TABLET ONCE DAILY. (Patient taking differently: Pt takes this med Mon, Wed, Fri.) 30 tablet 6  . traMADol (ULTRAM) 50 MG tablet Take 1 tablet (50 mg total) by mouth 2 (two) times daily. (Patient taking differently: Take 50 mg by mouth daily. ) 60 tablet 1  . verapamil (VERELAN PM) 240 MG 24 hr capsule Take 240 mg by mouth daily.     [name] warfarin (COUMADIN) 4 MG tablet Take 4 mg by mouth daily.      No current facility-administered medications for this visit.    OBJECTIVE: Elderly white woman who appears stated age  6 Vitals:   09/12/14 1026  BP: 142/48  Pulse: 62  Temp: 97.7 F (36.5 C)  Resp: 18     Body mass index is 34.14 kg/(m^2).    ECOG FS:1 - Symptomatic but completely ambulatory  Skin: warm, dry  HEENT: sclerae anicteric, conjunctivae pink, oropharynx clear. No thrush or mucositis.  Lymph Nodes: No cervical or supraclavicular lymphadenopathy  Lungs: clear to auscultation bilaterally, no rales, wheezes, or rhonci  Heart: regular rate and rhythm  Abdomen: round, soft, non tender, positive bowel sounds  Musculoskeletal: No focal spinal tenderness, no peripheral edema, soft golf ball sized mass palpated to inner right thigh Neuro: non focal, well oriented, positive affect  Breasts: right breast status post lumpectomy and radiation. No evidence of recurrent disease. Right axilla benign. Left breast unremarkable.  LAB RESULTS:  CMP     Component Value Date/Time   NA 141 09/12/2014 1007   NA 142 09/14/2013 0515   K 4.5 09/12/2014 1007   K 4.5 09/14/2013 0515   CL 102 09/14/2013 0515   CO2 27 09/12/2014 1007   CO2 30 09/14/2013 0515   GLUCOSE 228* 09/12/2014 1007   GLUCOSE 224* 09/14/2013 0515   BUN 59.3* 09/12/2014 1007   BUN 41* 09/14/2013 0515   CREATININE 2.1* 09/12/2014 1007   CREATININE 1.66* 09/14/2013 0515   CALCIUM 9.1 09/12/2014 1007   CALCIUM 9.2 09/14/2013 0515   PROT 6.8 09/12/2014 1007   PROT 5.9* 09/14/2013 0515   ALBUMIN 3.7 09/12/2014 1007   ALBUMIN 3.3* 09/14/2013 0515   AST 15 09/12/2014 1007   AST 10 09/14/2013 0515   ALT 13 09/12/2014 1007   ALT 13 09/14/2013 0515   ALKPHOS 93 09/12/2014 1007   ALKPHOS 72 09/14/2013 0515   BILITOT 0.58 09/12/2014 1007   BILITOT 0.7 09/14/2013 0515   GFRNONAA 27* 09/14/2013 0515   GFRAA 32* 09/14/2013 0515    I No results found for: SPEP  Lab Results  Component Value Date   WBC 5.9  09/12/2014   NEUTROABS 4.2 09/12/2014   HGB 10.9* 09/12/2014   HCT 33.1* 09/12/2014   MCV 91.6 09/12/2014   PLT 164 09/12/2014      Chemistry      Component Value Date/Time   NA 141 09/12/2014 1007   NA 142 09/14/2013 0515   K 4.5 09/12/2014 1007   K 4.5 09/14/2013 0515   CL 102 09/14/2013 0515   CO2 27 09/12/2014 1007   CO2 30 09/14/2013 0515   BUN 59.3* 09/12/2014 1007   BUN 41* 09/14/2013 0515   CREATININE 2.1* 09/12/2014 1007   CREATININE 1.66* 09/14/2013 0515      Component Value Date/Time   CALCIUM 9.1 09/12/2014 1007   CALCIUM 9.2 09/14/2013 0515   ALKPHOS 93 09/12/2014 1007   ALKPHOS 72 09/14/2013 0515   AST 15 09/12/2014 1007   AST 10 09/14/2013 0515   ALT 13 09/12/2014 1007   ALT 13 09/14/2013 0515   BILITOT 0.58 09/12/2014 1007   BILITOT 0.7 09/14/2013 0515       No results found for: LABCA2  No components found for: LABCA125  No results for input(s): INR in the last 168 hours.  Urinalysis    Component Value Date/Time   COLORURINE YELLOW 09/13/2013 0523   APPEARANCEUR CLEAR 09/13/2013 0523   LABSPEC 1.010 09/13/2013 0523   PHURINE 7.0 09/13/2013 0523   GLUCOSEU 100* 09/13/2013 0523   HGBUR NEGATIVE 09/13/2013 0523   BILIRUBINUR NEGATIVE 09/13/2013 0523   KETONESUR NEGATIVE 09/13/2013 0523   PROTEINUR NEGATIVE 09/13/2013 0523   UROBILINOGEN 0.2 09/13/2013 0523   NITRITE NEGATIVE 09/13/2013 0523   LEUKOCYTESUR MODERATE* 09/13/2013 0523    STUDIES: No results found.  ASSESSMENT: 81 y.o. [place] woman status post right lumpectomy and axillary lymph node sampling 03/30/2013 for a pT1c pN1a, stage IIA invasive ductal carcinoma, grade 3, estrogen receptor 100% positive, progesterone receptor 100% positive, with an MIB-1 of 15%, and HER-2 amplified, with a signals ratio of 2.54 and the number per cell being 3.55.  (a) margins were positive but cleared with additional surgery February 2015 ([id number])  (1) adjuvant chemotherapy /immunotherapy  with paclitaxel and trastuzumab weekly started 05/10/2013, discontinued after 2 doses because of poor tolerance  (2) ventilation/perfusion scan 06/10/2013 documented right lower lobe pulmonary emboli; Doppler ultrasound 06/11/2013 documented bilateral lower extremity DVTs; started on warfarin managed through Dr. Carlyle Lipa office  (3) anastrozole started June 2015  (4) adjuvant radiation completed 10/20/2013  (5) Trastuzumab resumed 09/07/2013, continued through 06/07/2014;  final echocardiogram on 03/30/2016showed a well preserved ejection fraction  (6) genetics testing April 2015 was normal and did not reveal a mutation in any of these genes: APC, ATM, AXIN2, BARD1, BMPRIA, BRCA1, BRCA2, BRIP1, CDH1, CDK4, CDKN2A, CHEK2, EPCAM, FANCC, MLH1, MSH2, MSH6, MUTYH, NBN, PALB2, PMS2, PTEN, RAD51C, SMAD4, STK11, TP53, VHL, and XRCC2  (7) osteopenia: DEXA scan 08/17/2013 showed a T score of -2.1-- started Prolia (denosumab) 01/11/2014  PLAN: Overall Rollande is doing well today. The labs were reviewed in detail and were stable. She is tolerating the anastrozole well and will continue this for 5 years of antiestrogen therapy. We are going to check a d-dimer today and again in 6 months. If it is normal on the first check we will be able to discontinue her warfarin therapy.   Dr. Jana Hakim evaluated the "knot" to her right inner thigh and agreed that this likely a fatty lipoma, not requiring imaging or removal. She will continue to monitor this and will alert [place] of any  changes.   Braniya will return in 3 months for labs and a follow up visit. She understands and agrees with this plan. She knows the goal of treatment in her case is cure. She has been encouraged to call with any issues that might arise before her next visit here.  Laurie Panda, NP   09/12/2014 12:16 PM

## 2014-09-13 ENCOUNTER — Telehealth: Payer: Self-pay | Admitting: *Deleted

## 2014-09-13 LAB — D-DIMER, QUANTITATIVE (NOT AT ARMC): D-Dimer, Quant: 0.35 ug/mL-FEU (ref 0.00–0.48)

## 2014-09-13 NOTE — Telephone Encounter (Signed)
Called to inform pt of results concerning the D-dimer which was normal at 0.35, also told the pt that she can stop the Coumadin per NP. I also faxed a copy of this info to her PCP per pt's request. Pt verbalized understanding. No further concerns. Message to be fwd to Gentry Fitz, NP.

## 2014-10-12 ENCOUNTER — Other Ambulatory Visit (HOSPITAL_BASED_OUTPATIENT_CLINIC_OR_DEPARTMENT_OTHER): Payer: Medicare Other

## 2014-10-12 DIAGNOSIS — C50411 Malignant neoplasm of upper-outer quadrant of right female breast: Secondary | ICD-10-CM | POA: Diagnosis not present

## 2014-10-12 DIAGNOSIS — I82403 Acute embolism and thrombosis of unspecified deep veins of lower extremity, bilateral: Secondary | ICD-10-CM

## 2014-10-12 LAB — COMPREHENSIVE METABOLIC PANEL (CC13)
ALT: 16 U/L (ref 0–55)
AST: 14 U/L (ref 5–34)
Albumin: 3.6 g/dL (ref 3.5–5.0)
Alkaline Phosphatase: 96 U/L (ref 40–150)
Anion Gap: 8 mEq/L (ref 3–11)
BUN: 63.5 mg/dL — ABNORMAL HIGH (ref 7.0–26.0)
CO2: 25 mEq/L (ref 22–29)
Calcium: 8.8 mg/dL (ref 8.4–10.4)
Chloride: 108 mEq/L (ref 98–109)
Creatinine: 1.9 mg/dL — ABNORMAL HIGH (ref 0.6–1.1)
EGFR: 24 mL/min/{1.73_m2} — ABNORMAL LOW (ref >90)
Glucose: 158 mg/dl — ABNORMAL HIGH (ref 70–140)
Potassium: 4.9 mEq/L (ref 3.5–5.1)
Sodium: 140 mEq/L (ref 136–145)
Total Bilirubin: 0.52 mg/dL (ref 0.20–1.20)
Total Protein: 6.7 g/dL (ref 6.4–8.3)

## 2014-10-12 LAB — CBC WITH DIFFERENTIAL/PLATELET
BASO%: 0.7 % (ref 0.0–2.0)
Basophils Absolute: 0 10*3/uL (ref 0.0–0.1)
EOS%: 2.7 % (ref 0.0–7.0)
Eosinophils Absolute: 0.2 10*3/uL (ref 0.0–0.5)
HCT: 32.4 % — ABNORMAL LOW (ref 34.8–46.6)
HGB: 10.7 g/dL — ABNORMAL LOW (ref 11.6–15.9)
LYMPH%: 20.4 % (ref 14.0–49.7)
MCH: 30.2 pg (ref 25.1–34.0)
MCHC: 33 g/dL (ref 31.5–36.0)
MCV: 91.4 fL (ref 79.5–101.0)
MONO#: 0.5 10*3/uL (ref 0.1–0.9)
MONO%: 9.3 % (ref 0.0–14.0)
NEUT#: 3.8 10*3/uL (ref 1.5–6.5)
NEUT%: 66.9 % (ref 38.4–76.8)
Platelets: 174 10*3/uL (ref 145–400)
RBC: 3.54 10*6/uL — ABNORMAL LOW (ref 3.70–5.45)
RDW: 13 % (ref 11.2–14.5)
WBC: 5.7 10*3/uL (ref 3.9–10.3)
lymph#: 1.2 10*3/uL (ref 0.9–3.3)

## 2014-10-12 LAB — D-DIMER, QUANTITATIVE: D-Dimer, Quant: 0.39 ug/mL-FEU (ref 0.00–0.48)

## 2014-12-19 ENCOUNTER — Other Ambulatory Visit: Payer: Self-pay | Admitting: Cardiology

## 2014-12-21 ENCOUNTER — Other Ambulatory Visit (HOSPITAL_BASED_OUTPATIENT_CLINIC_OR_DEPARTMENT_OTHER): Payer: Medicare Other

## 2014-12-21 ENCOUNTER — Telehealth: Payer: Self-pay | Admitting: Oncology

## 2014-12-21 ENCOUNTER — Ambulatory Visit (HOSPITAL_BASED_OUTPATIENT_CLINIC_OR_DEPARTMENT_OTHER): Payer: Medicare Other | Admitting: Oncology

## 2014-12-21 VITALS — BP 158/46 | HR 66 | Temp 97.6°F | Resp 18 | Ht 59.0 in | Wt 172.2 lb

## 2014-12-21 DIAGNOSIS — L539 Erythematous condition, unspecified: Secondary | ICD-10-CM

## 2014-12-21 DIAGNOSIS — M8580 Other specified disorders of bone density and structure, unspecified site: Secondary | ICD-10-CM

## 2014-12-21 DIAGNOSIS — Z170 Estrogen receptor positive status [ER+]: Secondary | ICD-10-CM

## 2014-12-21 DIAGNOSIS — C50411 Malignant neoplasm of upper-outer quadrant of right female breast: Secondary | ICD-10-CM

## 2014-12-21 DIAGNOSIS — C773 Secondary and unspecified malignant neoplasm of axilla and upper limb lymph nodes: Secondary | ICD-10-CM

## 2014-12-21 LAB — CBC WITH DIFFERENTIAL/PLATELET
BASO%: 0.8 % (ref 0.0–2.0)
Basophils Absolute: 0 10*3/uL (ref 0.0–0.1)
EOS%: 2.8 % (ref 0.0–7.0)
Eosinophils Absolute: 0.2 10*3/uL (ref 0.0–0.5)
HCT: 31.6 % — ABNORMAL LOW (ref 34.8–46.6)
HGB: 10.5 g/dL — ABNORMAL LOW (ref 11.6–15.9)
LYMPH%: 22.1 % (ref 14.0–49.7)
MCH: 30.5 pg (ref 25.1–34.0)
MCHC: 33.2 g/dL (ref 31.5–36.0)
MCV: 92 fL (ref 79.5–101.0)
MONO#: 0.5 10*3/uL (ref 0.1–0.9)
MONO%: 9.3 % (ref 0.0–14.0)
NEUT#: 3.6 10*3/uL (ref 1.5–6.5)
NEUT%: 65 % (ref 38.4–76.8)
Platelets: 194 10*3/uL (ref 145–400)
RBC: 3.44 10*6/uL — ABNORMAL LOW (ref 3.70–5.45)
RDW: 12.8 % (ref 11.2–14.5)
WBC: 5.5 10*3/uL (ref 3.9–10.3)
lymph#: 1.2 10*3/uL (ref 0.9–3.3)

## 2014-12-21 LAB — COMPREHENSIVE METABOLIC PANEL (CC13)
ALT: 12 U/L (ref 0–55)
AST: 14 U/L (ref 5–34)
Albumin: 3.6 g/dL (ref 3.5–5.0)
Alkaline Phosphatase: 78 U/L (ref 40–150)
Anion Gap: 7 mEq/L (ref 3–11)
BUN: 66.7 mg/dL — ABNORMAL HIGH (ref 7.0–26.0)
CO2: 27 mEq/L (ref 22–29)
Calcium: 9.4 mg/dL (ref 8.4–10.4)
Chloride: 108 mEq/L (ref 98–109)
Creatinine: 1.9 mg/dL — ABNORMAL HIGH (ref 0.6–1.1)
EGFR: 23 mL/min/{1.73_m2} — ABNORMAL LOW (ref >90)
Glucose: 116 mg/dl (ref 70–140)
Potassium: 4.7 mEq/L (ref 3.5–5.1)
Sodium: 141 mEq/L (ref 136–145)
Total Bilirubin: 0.51 mg/dL (ref 0.20–1.20)
Total Protein: 6.7 g/dL (ref 6.4–8.3)

## 2014-12-21 MED ORDER — ANASTROZOLE 1 MG PO TABS: 1.0000 mg | ORAL_TABLET | Freq: Every day | ORAL | Status: DC

## 2014-12-21 NOTE — Progress Notes (Signed)
[place]  [phone] [phone]     ID: Sabrina [name] DOB: [birth year]  MR#: [id number]  JGG#:[id number]  PCP: Sabrina Argyle, MD GYN: SU: Rolm Bookbinder OTHER MD: Thea Silversmith  CHIEF COMPLAINT: Estrogen receptor positive breast cancer  CURRENT TREATMENT: Anastrozole, denosumab   BREAST CANCER HISTORY: From Dr. Bernell List Khan's intake note 03/24/2013:  "Sabrina [name] is a 81 y.o. female. Who underwent a screening mammogram. She was found to have a right breast mass measuring 1 cm. There were no abnormalities on her physical exam. Patient had ultrasound performed that showed 1.3 x 1.2 x 1.0 cm hypoechoic mass in the upper outer quadrant of the right breast. In the right axilla there was an abnormal appearing lymph node measuring 1.65 1.4 x 0.8 cm. Both her biopsy. The lymph node was positive for metastatic carcinoma. The primary breast tumor biopsy showed invasive ductal carcinoma, grade 2, ER positive PR positive HER-2/neu equivocal with a proliferation marker Ki-67 15%.."  On 03/30/2013 the patient underwent right lumpectomy in right axillary lymph node sampling. The pathology ([id number]) showed a 1.4 cm invasive ductal carcinoma grade 3, present at the lateral and posterior margins. 3 axillary lymph nodes (non-sentinel lymph nodes) were sampled, one of which had a micrometastatic tumor deposits with extracapsular extension. Repeat HER-2 was positive, with a signals ratio of 2.54 and the number per cell of 3.55.  The patient was started on weekly Taxol and trastuzumab, but tolerated treatment poorly and develop bilateral DVTs and pulmonary embolism April 2015. At that time her chemotherapy was interrupted.  Her subsequent history is as detailed below.   INTERVAL HISTORY: Sabrina [name] returns today for follow up of her breast cancer accompanied by her husband Sabrina [name]. She continues on anastrozole, with good tolerance. Hot flashes and vaginal dryness are  not major issues. She has not developed the arthralgias and myalgias that patients can develop on this medication. She obtains it for approximately $3 a month  REVIEW OF SYSTEMS: Sabrina [name] not exercising in any regular weight. She walks around the house. Of course she loves to read. She complains of feeling more tired than she should. She is busy with musical events and social events. Occasionally her ankles swell. She gets short of breath when walking up stairs. She has a hiatal hernia. She bruises easily. The arthritis is chiefly in her back and hips. Also her knees can hurt. She has a history of gout as well. Her diabetes is moderately well-controlled. A detailed review of systems today was otherwise stable  PAST MEDICAL HISTORY: Past Medical History  Diagnosis Date  . Heart murmur     no known problems; states did not know she had murmur until age 41  . Immature cataract   . Arthritis     neck  . Wears partial dentures     lower  . Dental crowns present   . Hypertension     fluctuates, especially when stressed; has been on med. > 20 yr.  . Non-insulin dependent type 2 diabetes mellitus   . High cholesterol   . Chronic kidney disease (CKD), stage III (moderate)     nephrologist, Dr. [name]  . Breast cancer 03/2013    right  . Radiation 09/06/13-10/20/13    Right Breast Cancer    PAST SURGICAL HISTORY: Past Surgical History  Procedure Laterality Date  . Tonsillectomy      as a child  . Breast surgery Right 11/1958    right breast biopsy benign  .  Dilation and curettage of uterus    . Breast lumpectomy with needle localization Right 03/30/2013    Procedure: BREAST LUMPECTOMY WITH NEEDLE LOCALIZATION;  Surgeon: Rolm Bookbinder, MD;  Location: [place];  Service: General;  Laterality: Right;  . Axillary lymph node dissection Right 03/30/2013    Procedure: AXILLARY LYMPH NODE DISSECTION;  Surgeon: Rolm Bookbinder, MD;  Location: [place];  Service: General;  Laterality: Right;    . Re-excision of breast cancer,superior margins Right 04/15/2013    Procedure: RE-EXCISION OF RIGHT BREAST  MARGINS;  Surgeon: Rolm Bookbinder, MD;  Location: [place];  Service: General;  Laterality: Right;  . Portacath placement N/A 04/15/2013    Procedure: INSERTION PORT-A-CATH;  Surgeon: Rolm Bookbinder, MD;  Location: [place];  Service: General;  Laterality: N/A;    FAMILY HISTORY Family History  Problem Relation Age of Onset  . Pneumonia Mother   . Heart attack Father   . Breast cancer Other 46    niece  . Breast cancer Other 30    niece   the patient's father died in his 77s, in the setting of multiple medical problems. The patient's mother died in her 83s from pneumonia. The patient had 4 sisters, one of whom has died from complications of diabetes. She had a brother who died at 95 months. There is no history of breast cancer in the immediate family although the patient does have 2 nieces with breast cancer. The patient has been tested for the BRCA mutations and her genetics was normal  GYNECOLOGIC HISTORY:  No LMP recorded. Patient is postmenopausal. Menarche age 10, the patient is GX P0. She went through menopause in her 60s, took hormone replacement until she was in her 59s.   SOCIAL HISTORY:  Sabrina [name] worked as the [employer] and locally. She and her husband Sabrina [name] greatly enjoy [employer] music Festival. 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, now for the [employer].    ADVANCED DIRECTIVES: In place   HEALTH MAINTENANCE: Social History  Substance Use Topics  . Smoking status: Never Smoker   . Smokeless tobacco: Never Used     Comment: only smoked 2 packs cigarettes total  . Alcohol Use: 0.0 oz/week     Comment: 2-3 glasses wine/week     No Known Allergies  Current Outpatient Prescriptions  Medication Sig Dispense Refill  . anastrozole  (ARIMIDEX) 1 MG tablet TAKE 1 TABLET ONCE DAILY. 30 tablet 12  . atorvastatin (LIPITOR) 10 MG tablet Take 10 mg by mouth daily.    [name] BIOTIN 5000 PO Take 1 tablet by mouth daily.    . calcium-vitamin D (OSCAL WITH D) 250-125 MG-UNIT per tablet Take 1 tablet by mouth 2 (two) times daily.    . cholecalciferol (VITAMIN D) 1000 UNITS tablet Take 1,000 Units by mouth daily.    . cloNIDine (CATAPRES) 0.2 MG tablet Take 0.2 mg by mouth 2 (two) times daily.    [name] docusate sodium (COLACE) 100 MG capsule Take 100 mg by mouth 2 (two) times daily as needed for mild constipation. Pt takes 2 tablets in am and 2 tablets in the pm    . furosemide (LASIX) 40 MG tablet TAKE 1 TABLET IN AM AND 1/2 TABLET IN THE PM. 60 tablet 0  . glipiZIDE (GLUCOTROL) 2.5 mg TABS tablet Take 0.5 tablets (2.5 mg total) by mouth 2 (two) times daily before a meal.  60 tablet 1  . glucose blood test strip Use as instructed 100 each 12  . glucose monitoring kit (FREESTYLE) monitoring kit 1 each by Does not apply route as needed for other. 1 each 1  . IRON PO Take 65 mg by mouth daily.    [name] losartan (COZAAR) 25 MG tablet Take 25 mg by mouth daily.     . potassium chloride SA (K-DUR,KLOR-CON) 20 MEQ tablet TAKE 1 TABLET ONCE DAILY. (Patient taking differently: Pt takes this med Mon, Wed, Fri.) 30 tablet 6  . traMADol (ULTRAM) 50 MG tablet Take 1 tablet (50 mg total) by mouth 2 (two) times daily. (Patient taking differently: Take 50 mg by mouth daily. ) 60 tablet 1  . verapamil (VERELAN PM) 240 MG 24 hr capsule Take 240 mg by mouth daily.     [name] warfarin (COUMADIN) 4 MG tablet Take 4 mg by mouth daily.     No current facility-administered medications for this visit.    OBJECTIVE: Elderly white woman who appears stated age 5 Vitals:   12/21/14 1519  BP: 158/46  Pulse: 66  Temp: 97.6 F (36.4 C)  Resp: 18     Body mass index is 34.76 kg/(m^2).    ECOG FS:1 - Symptomatic but completely ambulatory  Sclerae unicteric, pupils round  and equal Oropharynx clear and moist-- no thrush or other lesions No cervical or supraclavicular adenopathy Lungs no rales or rhonchi Heart regular rate and rhythm Abd soft, obese,nontender, positive bowel sounds MSK no focal spinal tenderness, no upper extremity lymphedema Neuro: nonfocal, well oriented, appropriate affect Breasts: the right breast is status post lumpectomyand radiation. At the lateral aspect of the axillary scar there is a small 3 mm nodulararea of erythema with slightly increased vascularity. This is photographed below. The rest of the breast is unremarkable. The right axilla is benign. The left breast is benign    Photo right breast 12/21/2014    LAB RESULTS:  CMP     Component Value Date/Time   NA 140 10/12/2014 1256   NA 142 09/14/2013 0515   K 4.9 10/12/2014 1256   K 4.5 09/14/2013 0515   CL 102 09/14/2013 0515   CO2 25 10/12/2014 1256   CO2 30 09/14/2013 0515   GLUCOSE 158* 10/12/2014 1256   GLUCOSE 224* 09/14/2013 0515   BUN 63.5* 10/12/2014 1256   BUN 41* 09/14/2013 0515   CREATININE 1.9* 10/12/2014 1256   CREATININE 1.66* 09/14/2013 0515   CALCIUM 8.8 10/12/2014 1256   CALCIUM 9.2 09/14/2013 0515   PROT 6.7 10/12/2014 1256   PROT 5.9* 09/14/2013 0515   ALBUMIN 3.6 10/12/2014 1256   ALBUMIN 3.3* 09/14/2013 0515   AST 14 10/12/2014 1256   AST 10 09/14/2013 0515   ALT 16 10/12/2014 1256   ALT 13 09/14/2013 0515   ALKPHOS 96 10/12/2014 1256   ALKPHOS 72 09/14/2013 0515   BILITOT 0.52 10/12/2014 1256   BILITOT 0.7 09/14/2013 0515   GFRNONAA 27* 09/14/2013 0515   GFRAA 32* 09/14/2013 0515    I No results found for: SPEP  Lab Results  Component Value Date   WBC 5.5 12/21/2014   NEUTROABS 3.6 12/21/2014   HGB 10.5* 12/21/2014   HCT 31.6* 12/21/2014   MCV 92.0 12/21/2014   PLT 194 12/21/2014      Chemistry      Component Value Date/Time   NA 140 10/12/2014 1256   NA 142 09/14/2013 0515   K 4.9 10/12/2014 1256   K 4.5  09/14/2013  0515   CL 102 09/14/2013 0515   CO2 25 10/12/2014 1256   CO2 30 09/14/2013 0515   BUN 63.5* 10/12/2014 1256   BUN 41* 09/14/2013 0515   CREATININE 1.9* 10/12/2014 1256   CREATININE 1.66* 09/14/2013 0515      Component Value Date/Time   CALCIUM 8.8 10/12/2014 1256   CALCIUM 9.2 09/14/2013 0515   ALKPHOS 96 10/12/2014 1256   ALKPHOS 72 09/14/2013 0515   AST 14 10/12/2014 1256   AST 10 09/14/2013 0515   ALT 16 10/12/2014 1256   ALT 13 09/14/2013 0515   BILITOT 0.52 10/12/2014 1256   BILITOT 0.7 09/14/2013 0515       No results found for: LABCA2  No components found for: LABCA125  No results for input(s): INR in the last 168 hours.  Urinalysis    Component Value Date/Time   COLORURINE YELLOW 09/13/2013 0523   APPEARANCEUR CLEAR 09/13/2013 0523   LABSPEC 1.010 09/13/2013 0523   PHURINE 7.0 09/13/2013 0523   GLUCOSEU 100* 09/13/2013 0523   HGBUR NEGATIVE 09/13/2013 0523   BILIRUBINUR NEGATIVE 09/13/2013 0523   KETONESUR NEGATIVE 09/13/2013 0523   PROTEINUR NEGATIVE 09/13/2013 0523   UROBILINOGEN 0.2 09/13/2013 0523   NITRITE NEGATIVE 09/13/2013 0523   LEUKOCYTESUR MODERATE* 09/13/2013 0523    STUDIES: No results found.  ASSESSMENT: 81 y.o. [place] woman status post right lumpectomy and axillary lymph node sampling 03/30/2013 for a pT1c pN1a, stage IIA invasive ductal carcinoma, grade 3, estrogen receptor 100% positive, progesterone receptor 100% positive, with an MIB-1 of 15%, and HER-2 amplified, with a signals ratio of 2.54 and the number per cell being 3.55.  (a) margins were positive but cleared with additional surgery February 2015 ([id number])  (1) adjuvant chemotherapy /immunotherapy with paclitaxel and trastuzumab weekly started 05/10/2013, discontinued after 2 doses because of poor tolerance  (2) ventilation/perfusion scan 06/10/2013 documented right lower lobe pulmonary emboli; Doppler ultrasound 06/11/2013 documented bilateral lower extremity DVTs;  started on warfarin managed through Dr. Carlyle Lipa office  (3) anastrozole started June 2015  (4) adjuvant radiation completed 10/20/2013  (5) Trastuzumab resumed 09/07/2013, continued through 06/07/2014;  final echocardiogram on 03/30/2016showed a well preserved ejection fraction  (6) genetics testing April 2015 was normal and did not reveal a mutation in any of these genes: APC, ATM, AXIN2, BARD1, BMPRIA, BRCA1, BRCA2, BRIP1, CDH1, CDK4, CDKN2A, CHEK2, EPCAM, FANCC, MLH1, MSH2, MSH6, MUTYH, NBN, PALB2, PMS2, PTEN, RAD51C, SMAD4, STK11, TP53, VHL, and XRCC2  (7) osteopenia: DEXA scan 08/17/2013 showed a T score of -2.1-- started Prolia (denosumab) 01/11/2014  PLAN: Zasha is doing fine from a breast cancer point of view. She is tolerating the anastrozole well and the plan will be to continue that for 5 years.  She is receiving Denosumab for osteopenia, her last dose being 07/04/2014. She will receive her next dose of first week in November.  The little area of erythema and slight swelling at the lateral most aspect of her right breast incision is likely benign. I photograph dated today and I asked her to simply drop in in about 5 or 6 weeks so I can examine it 1 more. There has been no change then we will not need to evaluated further.  I think [place] really needs to start an exercise program. I'm referring her to the [facility] program and see if she and Michel Bickers participate over the next 3 months. I think that would significantly improve her functional status assuming this duct with it.  Otherwise she  will return to see [place] again in 3 months and we will continue to see her on an every three-month basis alternating with her other physicians until she completes her 5 years of follow-up. She knows to call for any problems that may develop before her next visit here.  Chauncey Cruel, MD   12/21/2014 3:32 PM

## 2014-12-21 NOTE — Telephone Encounter (Signed)
GAVE PATIENT AVS REPORT AND APPOINTMENTS FOR November 2016 AND January 2017.

## 2015-01-05 ENCOUNTER — Ambulatory Visit (HOSPITAL_BASED_OUTPATIENT_CLINIC_OR_DEPARTMENT_OTHER): Payer: Medicare Other

## 2015-01-05 VITALS — BP 154/55 | HR 84 | Temp 97.8°F

## 2015-01-05 DIAGNOSIS — M8580 Other specified disorders of bone density and structure, unspecified site: Secondary | ICD-10-CM | POA: Diagnosis not present

## 2015-01-05 MED ORDER — DENOSUMAB 60 MG/ML ~~LOC~~ SOLN
60.0000 mg | Freq: Once | SUBCUTANEOUS | Status: AC
Start: 2015-01-05 — End: 2015-01-05
  Administered 2015-01-05: 60 mg via SUBCUTANEOUS
  Filled 2015-01-05: qty 1

## 2015-02-01 ENCOUNTER — Telehealth: Payer: Self-pay | Admitting: Oncology

## 2015-02-01 ENCOUNTER — Other Ambulatory Visit: Payer: Self-pay | Admitting: Oncology

## 2015-02-01 ENCOUNTER — Other Ambulatory Visit: Payer: Self-pay

## 2015-02-01 DIAGNOSIS — C50611 Malignant neoplasm of axillary tail of right female breast: Secondary | ICD-10-CM

## 2015-02-01 NOTE — Telephone Encounter (Signed)
Patient stopped by today to talk with GM and desk nurse briefly and then came to scheduling to be scheduled for a mammo - order in EPIC. No pof sent. Patient given mammo/us and existing appointment for January.

## 2015-02-07 ENCOUNTER — Ambulatory Visit
Admission: RE | Admit: 2015-02-07 | Discharge: 2015-02-07 | Disposition: A | Discharge status: Home / Self Care | Payer: Medicare Other | Source: Ambulatory Visit | Attending: Oncology | Admitting: Oncology

## 2015-02-07 DIAGNOSIS — C50611 Malignant neoplasm of axillary tail of right female breast: Secondary | ICD-10-CM

## 2015-02-14 ENCOUNTER — Other Ambulatory Visit: Payer: Self-pay | Admitting: General Surgery

## 2015-02-16 ENCOUNTER — Other Ambulatory Visit: Payer: Self-pay | Admitting: Cardiology

## 2015-03-07 ENCOUNTER — Other Ambulatory Visit: Payer: Self-pay | Admitting: Internal Medicine

## 2015-03-21 ENCOUNTER — Other Ambulatory Visit: Payer: Self-pay | Admitting: Geriatric Medicine

## 2015-03-21 ENCOUNTER — Other Ambulatory Visit (HOSPITAL_BASED_OUTPATIENT_CLINIC_OR_DEPARTMENT_OTHER): Payer: Medicare Other

## 2015-03-21 ENCOUNTER — Encounter: Payer: Self-pay | Admitting: Nurse Practitioner

## 2015-03-21 ENCOUNTER — Telehealth: Payer: Self-pay | Admitting: Oncology

## 2015-03-21 ENCOUNTER — Ambulatory Visit (HOSPITAL_BASED_OUTPATIENT_CLINIC_OR_DEPARTMENT_OTHER): Payer: Medicare Other | Admitting: Nurse Practitioner

## 2015-03-21 VITALS — BP 161/50 | HR 70 | Temp 97.4°F | Resp 18 | Ht 59.0 in | Wt 175.4 lb

## 2015-03-21 DIAGNOSIS — M8580 Other specified disorders of bone density and structure, unspecified site: Secondary | ICD-10-CM

## 2015-03-21 DIAGNOSIS — C50411 Malignant neoplasm of upper-outer quadrant of right female breast: Secondary | ICD-10-CM

## 2015-03-21 DIAGNOSIS — R911 Solitary pulmonary nodule: Secondary | ICD-10-CM

## 2015-03-21 DIAGNOSIS — C773 Secondary and unspecified malignant neoplasm of axilla and upper limb lymph nodes: Secondary | ICD-10-CM

## 2015-03-21 DIAGNOSIS — Z170 Estrogen receptor positive status [ER+]: Secondary | ICD-10-CM

## 2015-03-21 NOTE — Progress Notes (Signed)
[place]  [phone] [phone]) [phone]     ID: [name] DOB: Jan 21, 1927  MR#: [id number]  YJE#:[id number]  PCP: Sabrina Argyle, MD GYN: SU: Sabrina [name] OTHER MD: Sabrina [name]  CHIEF COMPLAINT: Estrogen receptor positive breast cancer  CURRENT TREATMENT: Anastrozole, denosumab   BREAST CANCER HISTORY: From Dr. Bernell List [name]'s intake note 03/24/2013:  "Sabrina [name] is a 82 y.o. female. Who underwent a screening mammogram. She was found to have a right breast mass measuring 1 cm. There were no abnormalities on her physical exam. Patient had ultrasound performed that showed 1.3 x 1.2 x 1.0 cm hypoechoic mass in the upper outer quadrant of the right breast. In the right axilla there was an abnormal appearing lymph node measuring 1.65 1.4 x 0.8 cm. Both her biopsy. The lymph node was positive for metastatic carcinoma. The primary breast tumor biopsy showed invasive ductal carcinoma, grade 2, ER positive PR positive HER-2/neu equivocal with a proliferation marker Ki-67 15%.."  On 03/30/2013 the patient underwent right lumpectomy in right axillary lymph node sampling. The pathology ([id number]) showed a 1.4 cm invasive ductal carcinoma grade 3, present at the lateral and posterior margins. 3 axillary lymph nodes (non-sentinel lymph nodes) were sampled, one of which had a micrometastatic tumor deposits with extracapsular extension. Repeat HER-2 was positive, with a signals ratio of 2.54 and the number per cell of 3.55.  The patient was started on weekly Taxol and trastuzumab, but tolerated treatment poorly and develop bilateral DVTs and pulmonary embolism April 2015. At that time her chemotherapy was interrupted.  Her subsequent history is as detailed below.   INTERVAL HISTORY: Sabrina [name] returns today for follow up of her breast cancer accompanied by her husband, Sabrina [name]. She has been on anastrozole since June 2015 and tolerates this drug well with no  complaints. She denies hot flashes, vaginal changes, or increased arthralgias or myalgias beyond her baseline. She is suffering from a gout attack to her right ring finger and right great toe. She is no longer able to wear her shoes, and is in a pair of adjustable sandals despite the weather. She was seen by her PCP this morning and she has been on colchicine since Saturday.   The interval history is remarkable for a right ultrasound and mammogram that located a benign cyst. Dr. Donne [name] removed this last month. There have been no breast issues since this incident.   REVIEW OF SYSTEMS: Sabrina [name] denies fevers, chills, nausea, or vomiting. She was chronically constipated, but the colchicine is causing her to have looser stools. Her appetite is good. She has a hiatal hernia. She denies abdominal pain. She is generally fatigued, and feels unsteady on her feet, but she has no falls. She ambulates with a cane. She has arthritis to her back, hips, and knees. She denies shortness of breath, chest pain, cough, or palpitations. She has no headaches, dizziness, or vision changes. A detailed review of systems is otherwise stable.  PAST MEDICAL HISTORY: Past Medical History  Diagnosis Date  . Heart murmur     no known problems; states did not know she had murmur until age 84  . Immature cataract   . Arthritis     neck  . Wears partial dentures     lower  . Dental crowns present   . Hypertension     fluctuates, especially when stressed; has been on med. > 20 yr.  . Non-insulin dependent type 2 diabetes mellitus   . High cholesterol   .  Chronic kidney disease (CKD), stage III (moderate)     nephrologist, Dr. Corliss [name]  . Breast cancer 03/2013    right  . Radiation 09/06/13-10/20/13    Right Breast Cancer    PAST SURGICAL HISTORY: Past Surgical History  Procedure Laterality Date  . Tonsillectomy      as a child  . Breast surgery Right 11/1958    right breast biopsy benign  . Dilation and  curettage of uterus    . Breast lumpectomy with needle localization Right 03/30/2013    Procedure: BREAST LUMPECTOMY WITH NEEDLE LOCALIZATION;  Surgeon: Sabrina Bookbinder, MD;  Location: [place];  Service: General;  Laterality: Right;  . Axillary lymph node dissection Right 03/30/2013    Procedure: AXILLARY LYMPH NODE DISSECTION;  Surgeon: Sabrina Bookbinder, MD;  Location: [place];  Service: General;  Laterality: Right;  . Re-excision of breast cancer,superior margins Right 04/15/2013    Procedure: RE-EXCISION OF RIGHT BREAST  MARGINS;  Surgeon: Sabrina Bookbinder, MD;  Location: [facility];  Service: General;  Laterality: Right;  . Portacath placement N/A 04/15/2013    Procedure: INSERTION PORT-A-CATH;  Surgeon: Sabrina Bookbinder, MD;  Location: [facility];  Service: General;  Laterality: N/A;    FAMILY HISTORY Family History  Problem Relation Age of Onset  . Pneumonia Mother   . Heart attack Father   . Breast cancer Other 38    niece  . Breast cancer Other 62    niece   the patient's father died in his 90s, in the setting of multiple medical problems. The patient's mother died in her 81s from pneumonia. The patient had 4 sisters, one of whom has died from complications of diabetes. She had a brother who died at 59 months. There is no history of breast cancer in the immediate family although the patient does have 2 nieces with breast cancer. The patient has been tested for the BRCA mutations and her genetics was normal  GYNECOLOGIC HISTORY:  No LMP recorded. Patient is postmenopausal. Menarche age 57, the patient is GX P0. She went through menopause in her 59s, took hormone replacement until she was in her 1s.   SOCIAL HISTORY:  Sabrina [name] worked as the [employer] and locally. She and her husband Sabrina [name] greatly enjoy [employer] music Festival. 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, now for  the [employer].    ADVANCED DIRECTIVES: In place   HEALTH MAINTENANCE: Social History  Substance Use Topics  . Smoking status: Never Smoker   . Smokeless tobacco: Never Used     Comment: only smoked 2 packs cigarettes total  . Alcohol Use: 0.0 oz/week     Comment: 2-3 glasses wine/week     No Known Allergies  Current Outpatient Prescriptions  Medication Sig Dispense Refill  . anastrozole (ARIMIDEX) 1 MG tablet Take 1 tablet (1 mg total) by mouth daily. 90 tablet 12  . aspirin EC 81 MG tablet Take 1 tablet (81 mg total) by mouth daily.    [name] atorvastatin (LIPITOR) 10 MG tablet Take 10 mg by mouth daily.    [name] BIOTIN 5000 PO Take 1 tablet by mouth daily.    . cholecalciferol (VITAMIN D) 1000 UNITS tablet Take 1,000 Units by mouth daily.    . cloNIDine (CATAPRES) 0.2 MG tablet Take 0.2 mg by mouth 2 (two) times daily.    . colchicine 0.6 MG tablet Take 0.6  mg by mouth 2 (two) times daily.    . furosemide (LASIX) 40 MG tablet TAKE 1 TABLET IN AM AND 1/2 TABLET IN THE PM. 60 tablet 0  . glipiZIDE (GLUCOTROL) 2.5 mg TABS tablet Take 0.5 tablets (2.5 mg total) by mouth 2 (two) times daily before a meal. 60 tablet 1  . glucose blood test strip Use as instructed 100 each 12  . glucose monitoring kit (FREESTYLE) monitoring kit 1 each by Does not apply route as needed for other. 1 each 1  . IRON PO Take 65 mg by mouth daily.    [name] losartan (COZAAR) 25 MG tablet Take 25 mg by mouth daily.     . potassium chloride SA (K-DUR,KLOR-CON) 20 MEQ tablet TAKE 1 TABLET EACH DAY. (Patient taking differently: TAKE 1 TABLET EACH WEEK) 30 tablet 0  . traMADol (ULTRAM) 50 MG tablet Take 1 tablet (50 mg total) by mouth 2 (two) times daily. (Patient taking differently: Take 50 mg by mouth daily. ) 60 tablet 1  . verapamil (VERELAN PM) 240 MG 24 hr capsule Take 240 mg by mouth daily.     [name] docusate sodium (COLACE) 100 MG capsule Take 100 mg by mouth 2 (two) times daily as needed for mild  constipation. Reported on 03/21/2015     No current facility-administered medications for this visit.    OBJECTIVE: Elderly white woman who appears stated age 79 Vitals:   03/21/15 1342  BP: 161/50  Pulse: 70  Temp: 97.4 F (36.3 C)  Resp: 18     Body mass index is 35.41 kg/(m^2).    ECOG FS:1 - Symptomatic but completely ambulatory  Skin: warm, dry  HEENT: sclerae anicteric, conjunctivae pink, oropharynx clear. No thrush or mucositis.  Lymph Nodes: No cervical or supraclavicular lymphadenopathy  Lungs: clear to auscultation bilaterally, no rales, wheezes, or rhonci  Heart: regular rate and rhythm  Abdomen: round, soft, non tender, positive bowel sounds  Musculoskeletal: No focal spinal tenderness, obvious gout attack to right ring finger and right great toe Neuro: non focal, well oriented, positive affect  Breasts: right breast status post lumpectomy and radiation. No evidence of recurrent disease. Right axilla benign. Left breast unremarkable.   LAB RESULTS:  CMP     Component Value Date/Time   NA 141 12/21/2014 1502   NA 142 09/14/2013 0515   K 4.7 12/21/2014 1502   K 4.5 09/14/2013 0515   CL 102 09/14/2013 0515   CO2 27 12/21/2014 1502   CO2 30 09/14/2013 0515   GLUCOSE 116 12/21/2014 1502   GLUCOSE 224* 09/14/2013 0515   BUN 66.7* 12/21/2014 1502   BUN 41* 09/14/2013 0515   CREATININE 1.9* 12/21/2014 1502   CREATININE 1.66* 09/14/2013 0515   CALCIUM 9.4 12/21/2014 1502   CALCIUM 9.2 09/14/2013 0515   PROT 6.7 12/21/2014 1502   PROT 5.9* 09/14/2013 0515   ALBUMIN 3.6 12/21/2014 1502   ALBUMIN 3.3* 09/14/2013 0515   AST 14 12/21/2014 1502   AST 10 09/14/2013 0515   ALT 12 12/21/2014 1502   ALT 13 09/14/2013 0515   ALKPHOS 78 12/21/2014 1502   ALKPHOS 72 09/14/2013 0515   BILITOT 0.51 12/21/2014 1502   BILITOT 0.7 09/14/2013 0515   GFRNONAA 27* 09/14/2013 0515   GFRAA 32* 09/14/2013 0515    I No results found for: SPEP  Lab Results  Component  Value Date   WBC 5.5 12/21/2014   NEUTROABS 3.6 12/21/2014   HGB 10.5* 12/21/2014   HCT 31.6*  12/21/2014   MCV 92.0 12/21/2014   PLT 194 12/21/2014      Chemistry      Component Value Date/Time   NA 141 12/21/2014 1502   NA 142 09/14/2013 0515   K 4.7 12/21/2014 1502   K 4.5 09/14/2013 0515   CL 102 09/14/2013 0515   CO2 27 12/21/2014 1502   CO2 30 09/14/2013 0515   BUN 66.7* 12/21/2014 1502   BUN 41* 09/14/2013 0515   CREATININE 1.9* 12/21/2014 1502   CREATININE 1.66* 09/14/2013 0515      Component Value Date/Time   CALCIUM 9.4 12/21/2014 1502   CALCIUM 9.2 09/14/2013 0515   ALKPHOS 78 12/21/2014 1502   ALKPHOS 72 09/14/2013 0515   AST 14 12/21/2014 1502   AST 10 09/14/2013 0515   ALT 12 12/21/2014 1502   ALT 13 09/14/2013 0515   BILITOT 0.51 12/21/2014 1502   BILITOT 0.7 09/14/2013 0515       No results found for: LABCA2  No components found for: LABCA125  No results for input(s): INR in the last 168 hours.  Urinalysis    Component Value Date/Time   COLORURINE YELLOW 09/13/2013 0523   APPEARANCEUR CLEAR 09/13/2013 0523   LABSPEC 1.010 09/13/2013 0523   PHURINE 7.0 09/13/2013 0523   GLUCOSEU 100* 09/13/2013 0523   HGBUR NEGATIVE 09/13/2013 0523   BILIRUBINUR NEGATIVE 09/13/2013 0523   KETONESUR NEGATIVE 09/13/2013 0523   PROTEINUR NEGATIVE 09/13/2013 0523   UROBILINOGEN 0.2 09/13/2013 0523   NITRITE NEGATIVE 09/13/2013 0523   LEUKOCYTESUR MODERATE* 09/13/2013 0523    STUDIES: No results found.   EXAM: DIGITAL DIAGNOSTIC RIGHT MAMMOGRAM WITH 3D TOMOSYNTHESIS WITH CAD  ULTRASOUND RIGHT BREAST  COMPARISON: 03/16/2014, 03/30/2013, 03/17/2013, 02/18/2013, 02/14/2012.  ACR Breast Density Category d: The breast tissue is extremely dense, which lowers the sensitivity of mammography.  FINDINGS: There are mild postlumpectom scarring changes located laterally within the right breast. There are secretory and vascular calcifications present  within the right breast. There is no evidence for recurrent tumor or developing malignancy.  Mammographic images were processed with CAD.  On physical exam, there is a small superficial palpable nodule associated with the right sentinel lymph node biopsy site along its lateral margin with overlying erythema. This may represent an inflamed sebaceous cyst (less likely recurrent tumor).  Targeted ultrasound is performed, showing a subdermal, hypoechoic lesion located within the area of the right axillary sentinel lymph node biopsy site along its lateral margin. This measures 6 x 5 x 3 mm in size. This may represent sebaceous cyst. However, a definite sinus tract to the skin is not visible. I recommend that the patient be seen by Dr. Donne [name] for possible excision of this area if it does not resolve.  IMPRESSION: 6 mm subdermal hypoechoic lesion associated with the lateral border of the right sentinel lymph node biopsy site. This may represent a small inflamed sebaceous cyst. Recommend consultation with Dr. Donne [name] for possible excision of this area if it does not resolve.  RECOMMENDATION: Surgical consultation (patient has an appointment with Dr. Donne [name] for 02/08/2015).  I have discussed the findings and recommendations with the patient. Results were also provided in writing at the conclusion of the visit. If applicable, a reminder letter will be sent to the patient regarding the next appointment.  BI-RADS CATEGORY 3: Probably benign.   Electronically Signed  By: Altamese Cabal M.D.  On: 02/07/2015 11:29  ASSESSMENT: 82 y.o. [place] woman status post right lumpectomy and axillary lymph node sampling 03/30/2013  for a pT1c pN1a, stage IIA invasive ductal carcinoma, grade 3, estrogen receptor 100% positive, progesterone receptor 100% positive, with an MIB-1 of 15%, and HER-2 amplified, with a signals ratio of 2.54 and the number per cell being 3.55.  (a)  margins were positive but cleared with additional surgery February 2015 ([id number])  (1) adjuvant chemotherapy /immunotherapy with paclitaxel and trastuzumab weekly started 05/10/2013, discontinued after 2 doses because of poor tolerance  (2) ventilation/perfusion scan 06/10/2013 documented right lower lobe pulmonary emboli; Doppler ultrasound 06/11/2013 documented bilateral lower extremity DVTs; started on warfarin managed through Dr. Carlyle Lipa office  (3) anastrozole started June 2015  (4) adjuvant radiation completed 10/20/2013  (5) Trastuzumab resumed 09/07/2013, continued through 06/07/2014;  final echocardiogram on 03/30/2016showed a well preserved ejection fraction  (6) genetics testing April 2015 was normal and did not reveal a mutation in any of these genes: APC, ATM, AXIN2, BARD1, BMPRIA, BRCA1, BRCA2, BRIP1, CDH1, CDK4, CDKN2A, CHEK2, EPCAM, FANCC, MLH1, MSH2, MSH6, MUTYH, NBN, PALB2, PMS2, PTEN, RAD51C, SMAD4, STK11, TP53, VHL, and XRCC2  (7) osteopenia: DEXA scan 08/17/2013 showed a T score of -2.1-- started Prolia (denosumab) 01/11/2014  PLAN: Sabrina [name] is doing well so far on the anastrozole. She will continue on this drug for a total of 5 years on antiestrogen therapy. She is due for her next bilateral mammogram this month, so I have placed the appropriate orders for this to be performed at the [facility].  She will have a chest CT performed later this week, ordered by Dr. Felipa Eth, to follow a pulmonary nodule.  Roman will be due for her next prolia injection this May. She will return for follow up at that time. She understands and agrees with this plan. She knows the goal of treatment in her case is cur.e she has been encouraged to call with any issues that might arise before her next visit here.   Total time spent in appointment was 25 minutes, with greater than 50% of the time spent face to face with the patient.  Laurie Panda, NP   03/21/2015 2:12 PM

## 2015-03-21 NOTE — Telephone Encounter (Signed)
Gave  Patient avs report - appointments for May and mammo for 1/24 @ [facility].

## 2015-03-22 ENCOUNTER — Ambulatory Visit
Admission: RE | Admit: 2015-03-22 | Discharge: 2015-03-22 | Disposition: A | Discharge status: Home / Self Care | Payer: Medicare Other | Source: Ambulatory Visit | Attending: Geriatric Medicine | Admitting: Geriatric Medicine

## 2015-03-22 DIAGNOSIS — R911 Solitary pulmonary nodule: Secondary | ICD-10-CM

## 2015-03-28 ENCOUNTER — Ambulatory Visit
Admission: RE | Admit: 2015-03-28 | Discharge: 2015-03-28 | Disposition: A | Discharge status: Home / Self Care | Payer: Medicare Other | Source: Ambulatory Visit | Attending: Nurse Practitioner | Admitting: Nurse Practitioner

## 2015-03-28 DIAGNOSIS — C50411 Malignant neoplasm of upper-outer quadrant of right female breast: Secondary | ICD-10-CM

## 2015-04-17 ENCOUNTER — Other Ambulatory Visit: Payer: Self-pay | Admitting: Internal Medicine

## 2015-04-25 ENCOUNTER — Telehealth: Payer: Self-pay | Admitting: General Practice

## 2015-04-25 ENCOUNTER — Encounter: Payer: Self-pay | Admitting: *Deleted

## 2015-04-25 NOTE — Progress Notes (Signed)
[place] Work  Holiday representative received referral from physical therapist for emotional support.  CSW contacted patient at home to offer support and assess for psychosocial needs.  Patient stated she was tearful during her physical therapy appointment, but was feeling better today.  CSW and patient discussed the importance of emotional support and support available at [facility].  Patient was open to CSW support and was agreeable to meeting with [facility] Phd counseling intern.  CSW provided contact information and encouraged patient to call with any needs or concerns.  Johnnye Lana, MSW, LCSW, OSW-C Clinical Social Worker [facility] [phone]

## 2015-05-04 NOTE — Telephone Encounter (Signed)
Scheduled first counseling appointment for next Friday.

## 2015-05-05 ENCOUNTER — Encounter: Payer: Self-pay | Admitting: General Practice

## 2015-05-05 NOTE — Progress Notes (Signed)
Intake Counseling Session  First individual session with client. Counselor discussed PDS and informed consent form with client, and client and counselor signed both forms. Counselor used open questions to ask about client's presenting concern. Client presented as tearful, open, friendly and agreeable in session.Client stated that she has been struggling with uncontrollable crying spells. Counselor used reflection of feeling about her fear, frustration, and sadness. Counselor used open question to get more context around the crying spells. Client reported having the crying spells once almost everyday, and said that she has a history of crying easily but never as frequently as this. Counselor used open question around triggers for crying. Client reported that the crying starts when talking about her declining health, in the car, or thinking about her husbands declining health. Counselor and client talked about ways she copes with the sadness, and she reported spending time with her husband and friends and also involvement in music (chorus). Counselor and client explored client's crying further and discussed support systems, coping strategies, and potential reasons for the symptoms. Counselor and client talked about client's experience of going through breast cancer/treatment over the past two years, and how the crying seemed to get worse during that time. Counselor used reflection of feelings and content to capture client's difficult time and ability to be resilient. Counselor performed risk assessment, client denied any S/I or H/I. Counselor asked client what she is hoping to get out of counseling. Client reported not being sure, maybe just having someone to talk to and potentially to help manage crying spells. Counselor will follow up with client next week for a follow up session.    Wendall Papa, MS, [place], LPCA Counseling [place], [place], [place]

## 2015-05-12 ENCOUNTER — Encounter: Payer: Self-pay | Admitting: General Practice

## 2015-05-12 NOTE — Progress Notes (Signed)
Individual Counseling  Second counseling session with client. Counselor used open question to check in with client. Client responded that she has been feeling better over the past week, and reported not having as many crying episodes. Counselor used reflection of feeling of feeling and content to capture client's positive week. Counselor used open question about what parts of her week did trigger her crying. Counselor and client processed client's fear and anxiety that comes with her husband driving and her health problems. Counselor asked client to identify things in her life that make her happy and bring her joy. Client said that music, going to church, and spending time with friends. Counselor used process comment to capture client's happiness and joy when talking about those things. Counselor and client processed the changes that have taken place in the [place] over the past 50 years, and how that has impacted her. Counselor used reflection of feelings and content regarding client's physical, emotional, and mental experiences/changes that she has experienced over time. Counselor will follow up with client next week for another session.   Wendall Papa, MS, [place], [place], [place], [place]

## 2015-05-19 ENCOUNTER — Encounter: Payer: Self-pay | Admitting: General Practice

## 2015-05-19 NOTE — Progress Notes (Signed)
Counseling Session  Counselor used open question to check in with client. Client reported that she had not experienced as many crying spells over the past week. Counselor and client processed what has led to less crying spells which included not focusing too much on health or things in life that give her anxiety. Counselor used reflection of feelings and validation in regards to client's experiences. Client reflected on aspects of her relationship with her partner, friends, and how things have changed over time. Counselor and client processed client's experience of change, and things she has learned about herself and the world in her lifetime. Counselor and client processed client's experience of not having children, and the positive and negative effects of that in her and her husbands life. Counselor used reflection of feeling and content to talk about client's attitude of contentment with where she is in her life. Counselor and client continued to talk about client's self-reflections about the state of the world, and what has impacted her. Counselor will follow up with client next week for another counseling session.   Wendall Papa, MS, [place], LPCA Counseling Intern-Department for Spiritual Care and [place] [phone]

## 2015-05-22 DIAGNOSIS — L309 Dermatitis, unspecified: Secondary | ICD-10-CM | POA: Diagnosis not present

## 2015-05-22 DIAGNOSIS — R202 Paresthesia of skin: Secondary | ICD-10-CM | POA: Diagnosis not present

## 2015-05-22 DIAGNOSIS — Z23 Encounter for immunization: Secondary | ICD-10-CM | POA: Diagnosis not present

## 2015-05-26 ENCOUNTER — Encounter: Payer: Self-pay | Admitting: General Practice

## 2015-05-26 NOTE — Progress Notes (Signed)
Counseling Session  Counselor used open question to check how client has been doing. Client stated she has been doing fine. Counselor used reflection of feelings and a process comment about client's sad expression. Counselor and client processed what "fine" meant and how the client has been struggling with her mobility which has been inhibited her from doing things she has been able to do in the past. Counselor used reflection of loss of independence, and asked client's to speak what she was experiencing when she was crying. Counselor and client processed client's fear of death, in particular thinking about how her and/or her husband would survive without the other one. Counselor and client processed happy and positive memories from the client's past. Counselor validated the client's past experiences surrounding work and relationships, and acknowledged the impact and well-lived life she has and is continuing to live. Counselor will follow up with client next week.    Wendall Papa, MS, NCC, LPCA Counseling Intern-Dept. [place], [place], [place]

## 2015-06-02 ENCOUNTER — Encounter: Payer: Self-pay | Admitting: General Practice

## 2015-06-02 NOTE — Progress Notes (Signed)
Counseling Session  Counselor used open question to see how client has been doing. Client reported having a tougher week, and stated having a few crying spells. Counselor followed up about context around the crying. Client reported the crying stemming from her husband's driving and not telling her where he went. Counselor used reflection of feeling capturing client's anxiety and fear around her husband potentially getting in a car wreck, and not being able to communicate with him. Counselor inquired about history of client's anxiety, and counselor and client processed that she has struggled with mild anxiety throughout her life. Client validated and normalized client's feelings of anxiety, and assessed for any other symptoms associated with it. Client reported tearfulness being the main concern. Counselor and client processed client's history with other mental health professionals, and explored what she has learned throughout that process. Counselor and client processed client's anxiety that is present about death, and the reality that in the near future she will no longer be here. Counselor and client performed some life review to explore the life she has lived. Counselor will follow up with client for another session next week.   Wendall Papa, MS, NCC, LPCA Counseling Intern-Dept. [place], [place], [place]

## 2015-06-05 IMAGING — US US BREAST COMPLETE UNI RIGHT INC AXILLA
1 series · 12 of 12 positions shown · non-contrast
Comparison: Prior examinations.

CLINICAL DATA: RECALL FROM SCREENING 3D MAMMOGRAPHY.

EXAM:
ULTRASOUND OF THE RIGHT BREAST

[Series 1: breast · 12 of 12 slices shown]
[im 1/12]
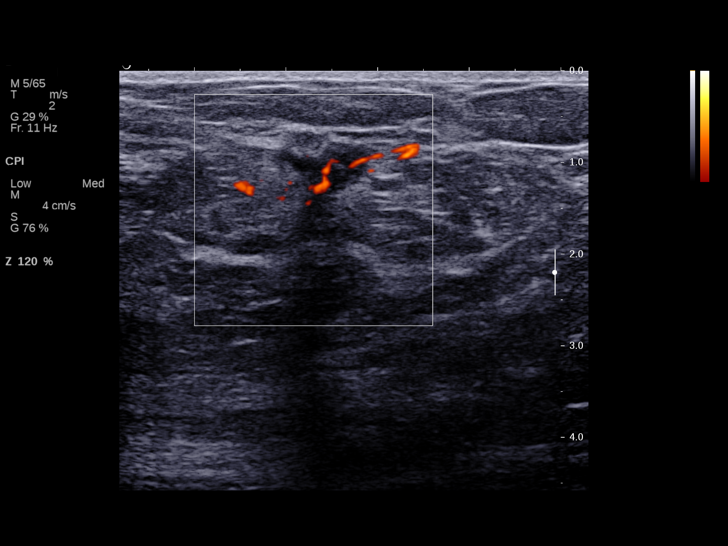
[im 2/12]
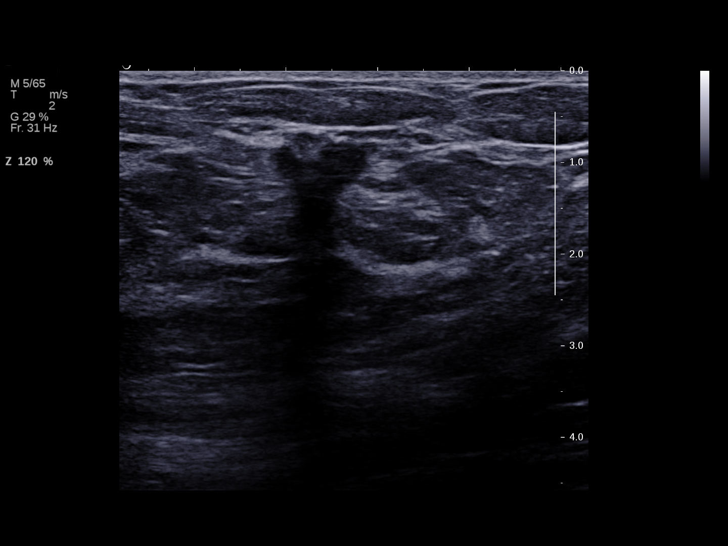
[im 3/12]
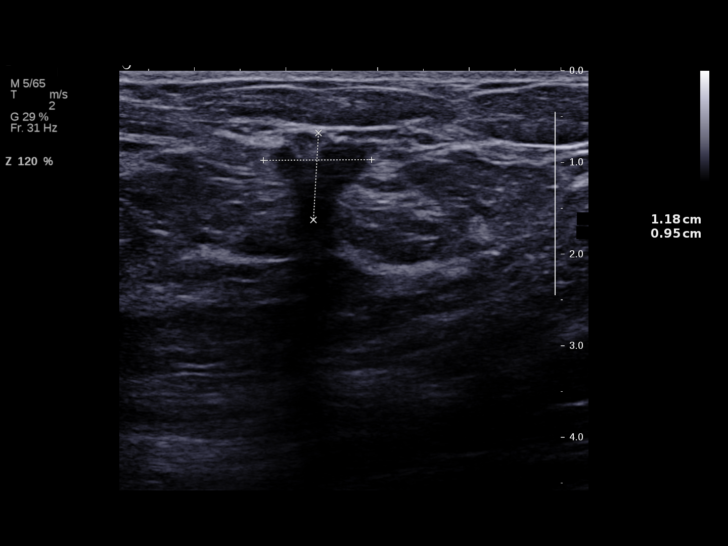
[im 4/12]
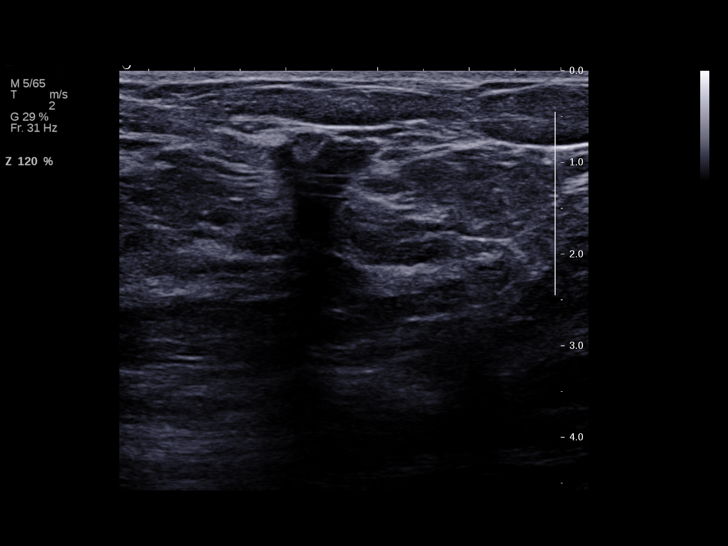
[im 5/12]
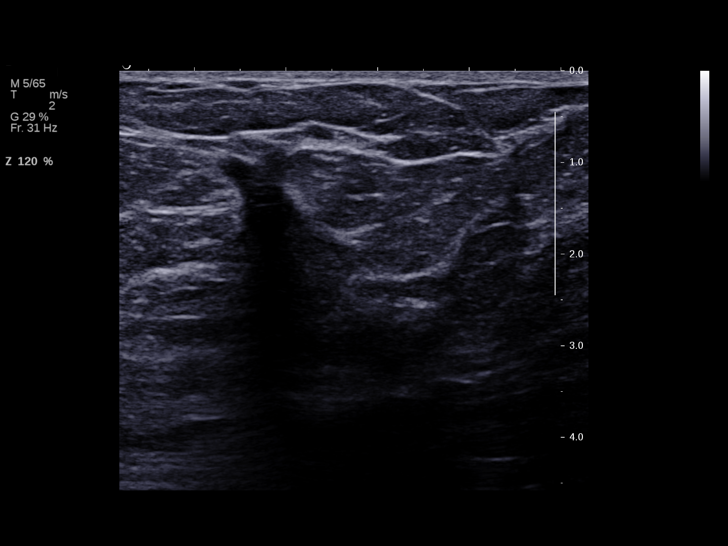
[im 6/12]
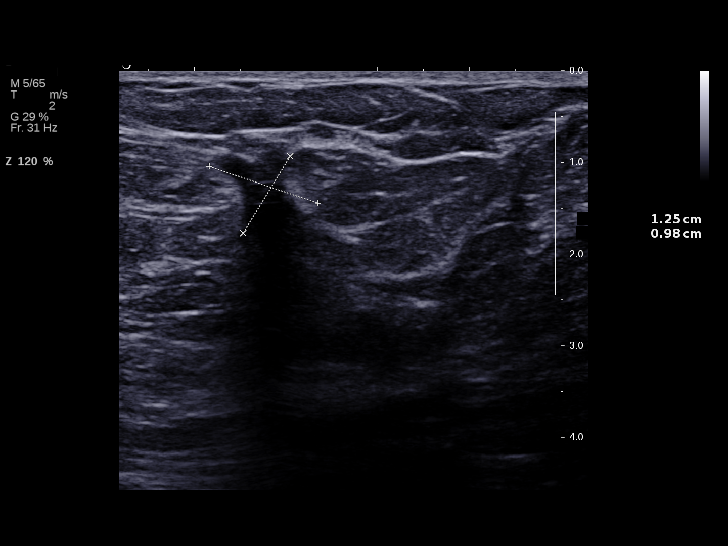
[im 7/12]
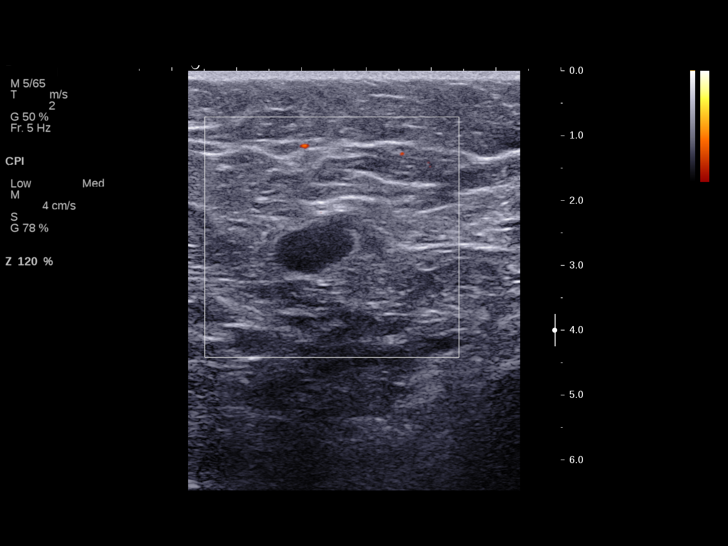
[im 8/12]
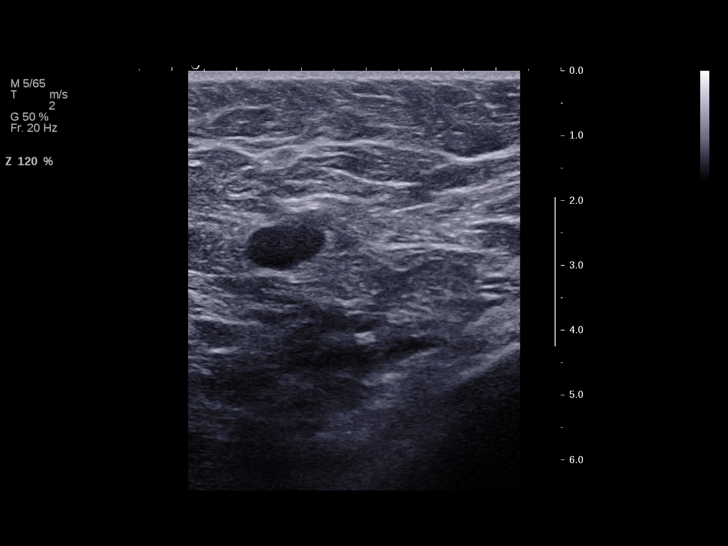
[im 9/12]
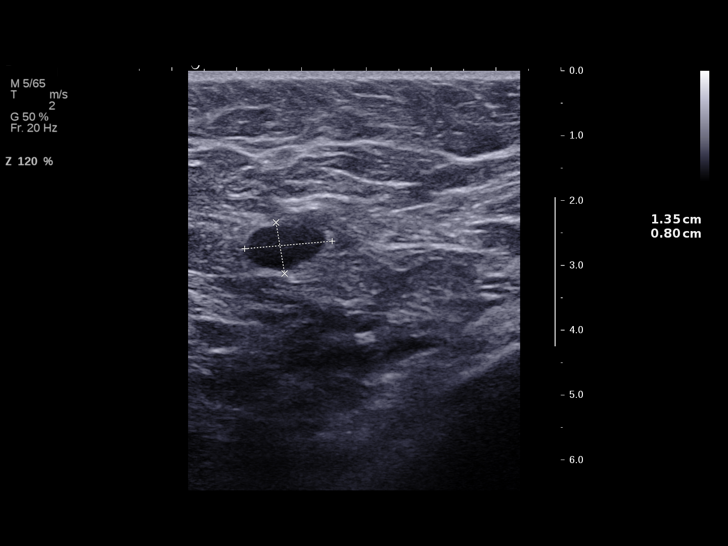
[im 10/12]
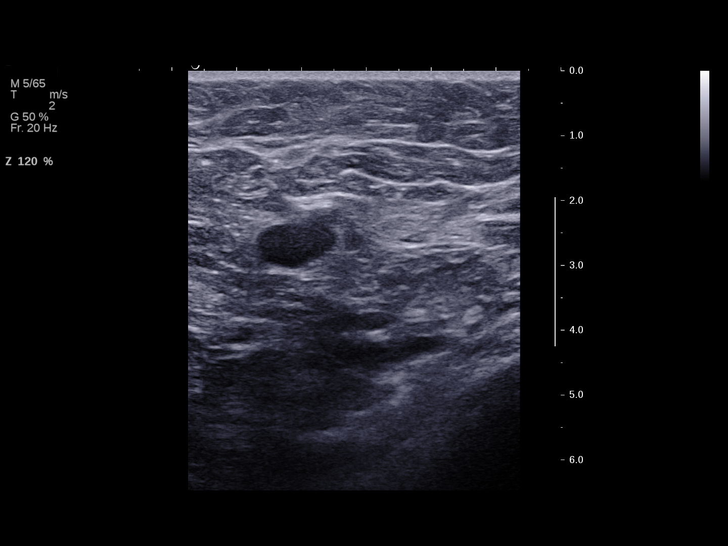
[im 11/12]
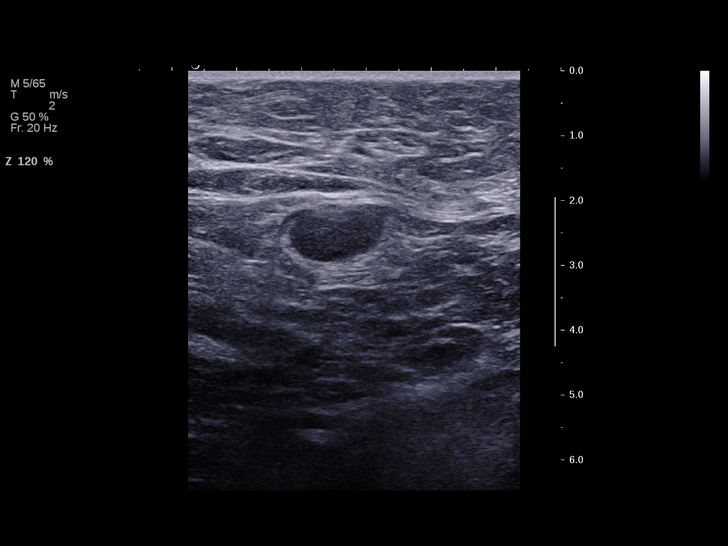
[im 12/12]
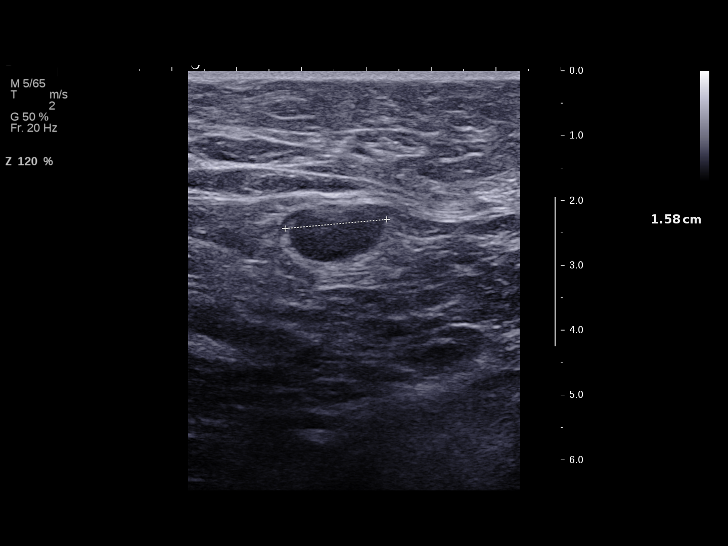

[12 of 12 positions shown; findings below may reference images not displayed]

FINDINGS: On physical exam,there is no discrete palpable abnormality within
the upper outer quadrant of the right breast..

Ultrasound is performed, showing an irregularly marginated
hypoechoic mass with angular margins and shadowing located within
the right breast at the 9:30 o'clock position 4 cm from the nipple.
This measures 1.3 x 1.2 x 1.0 cm in size and is worrisome for
invasive mammary carcinoma.

Ultrasound of the right axilla demonstrates an abnormal appearing
low axillary (level 1) lymph node with loss of normal fatty hilum.
This measures 1.6 x 1.4 x 0.8 cm in size..

I have discussed ultrasound-guided core biopsy of the right breast
mass and abnormal right axillary lymph node with the patient. This
will be scheduled.
IMPRESSION: 1.3 cm suspicious mass located within the right breast at the 9:30
o'clock position 4 cm from the nipple and abnormal appearing low
axillary (level 1) right axillary lymph node. Tissue sampling via
ultrasound-guided core biopsy of the breast mass and axillary lymph
node is recommended and has been scheduled for 03/17/2013.

RECOMMENDATION:
Right breast ultrasound-guided core biopsy and ultrasound-guided
core biopsy of abnormal appearing right axillary lymph node.

I have discussed the findings and recommendations with the patient.
Results were also provided in writing at the conclusion of the
visit. If applicable, a reminder letter will be sent to the patient
regarding the next appointment.

BI-RADS CATEGORY  5: Highly suggestive of malignancy - appropriate
action should be taken.

## 2015-06-08 ENCOUNTER — Encounter: Payer: Self-pay | Admitting: General Practice

## 2015-06-08 NOTE — Progress Notes (Signed)
Counseling Session  Individual session with client. Counselor used open question to check in with client. Client reported that she has had a stressful morning, and that she was tearful because of husband's driving and fear that his seizures might come back if her husband is not able to stay on his medication due to insurance reasons. Counselor and client processed client's frustration and fear around her and her husband's health, and talked about how her tearfulness is triggered when she is sad, frustrated, and angry. Counselor used reflection of client's fear and resistance to change, and how that influences her tearfulness that she experiences frequently. Counselor used open question about what changes in her life that are impacting her the most. Client reported struggling with her physical limitations and things being "out of place" in regards to habitual behavior. Counselor and client processed the significance the butterfly has in her life, because it grows into something beautiful. Counselor and client processed client's struggle with this season, especially given the existential nature of where she is in her life. Counselor will follow up with client next week for another session.    Wendall Papa, MS, NCC, LPCA Counseling Intern-Dept. For Spiritual Care and [facility] [name], [place]

## 2015-06-15 ENCOUNTER — Encounter: Payer: Self-pay | Admitting: General Practice

## 2015-06-15 NOTE — Progress Notes (Signed)
Counseling session  Counselor used open question to check in with client. Client reported having some physical pains and aches over the past week, but that overall she was doing fine. Counselor broached the topic of termination with the client and checked in to see how she was feeling about it. Client stated that she has appreciated coming to counseling and that she has become more self-aware about what the triggers are for her worry and crying spells. Counselor used validation and affirmation regarding client's growth and insight she has gained about herself over the course of the counseling sessions together. Counselor and client collaboratively discussed coping mechanisms and tools to maintain wellness and manage her worry/crying after counseling. Counselor used psycho education to explain to the client about mindfulness practice and the benefits it could have in helping reduce anxiety and crying. Counselor suggested deep breathing as an immediate coping mechanism when the crying spell comes on using the two count inhale and three count exhale. Counselor and client explored other realistic coping mechanisms the client can do including going for walks and listening to music again. Counselor showed client youtube channels and Raina Mina as a way to access free meditation and music. Counselor will have termination session with client next week.   Wendall Papa, MS, NCC, LPCA Counseling Intern-Dept. For Spiritual Care and [facility] [name], [place]

## 2015-06-19 ENCOUNTER — Other Ambulatory Visit: Payer: Self-pay | Admitting: Internal Medicine

## 2015-06-19 DIAGNOSIS — E1121 Type 2 diabetes mellitus with diabetic nephropathy: Secondary | ICD-10-CM | POA: Diagnosis not present

## 2015-06-19 DIAGNOSIS — I129 Hypertensive chronic kidney disease with stage 1 through stage 4 chronic kidney disease, or unspecified chronic kidney disease: Secondary | ICD-10-CM | POA: Diagnosis not present

## 2015-06-19 DIAGNOSIS — Z7984 Long term (current) use of oral hypoglycemic drugs: Secondary | ICD-10-CM | POA: Diagnosis not present

## 2015-06-22 ENCOUNTER — Encounter: Payer: Self-pay | Admitting: General Practice

## 2015-06-22 NOTE — Progress Notes (Signed)
Counseling Session  Counselor used open question to see how client is doing. Client reported not having any crying spells over the course of the week, and having a happy and relaxed week. Counselor and client processed client's positive week, and explored reasons why she might have not had crying spells. Counselor used immediacy regarding this being the last counseling session together. Counselor and client processed the awareness and positive relationship that has been built throughout this counseling relationship. Counselor and client explored and summarized client's progress and discussed ways the client might continue to manage the crying spells moving forward. Counselor and client processed the client's favorite life memories, and discussed the existential nature of what it is like for her currently in her life.   Wendall Papa, MS, [place], LPCA Counseling Intern-Department for Spiritual Care and [place], [place], [place]

## 2015-06-26 DIAGNOSIS — C50911 Malignant neoplasm of unspecified site of right female breast: Secondary | ICD-10-CM | POA: Diagnosis not present

## 2015-06-26 DIAGNOSIS — I2699 Other pulmonary embolism without acute cor pulmonale: Secondary | ICD-10-CM | POA: Diagnosis not present

## 2015-06-26 DIAGNOSIS — I129 Hypertensive chronic kidney disease with stage 1 through stage 4 chronic kidney disease, or unspecified chronic kidney disease: Secondary | ICD-10-CM | POA: Diagnosis not present

## 2015-06-26 DIAGNOSIS — E1129 Type 2 diabetes mellitus with other diabetic kidney complication: Secondary | ICD-10-CM | POA: Diagnosis not present

## 2015-06-26 DIAGNOSIS — M109 Gout, unspecified: Secondary | ICD-10-CM | POA: Diagnosis not present

## 2015-06-26 DIAGNOSIS — N183 Chronic kidney disease, stage 3 (moderate): Secondary | ICD-10-CM | POA: Diagnosis not present

## 2015-07-13 IMAGING — CR DG CHEST 1V PORT
1 series · 1 of 1 positions shown · non-contrast
Comparison: DG CHEST 2 VIEW dated 03/30/2013; DG CHEST 2V dated
02/19/2012

CLINICAL DATA: Status post porta catheter insertion.

EXAM:
PORTABLE CHEST - 1 VIEW

[view not recorded]
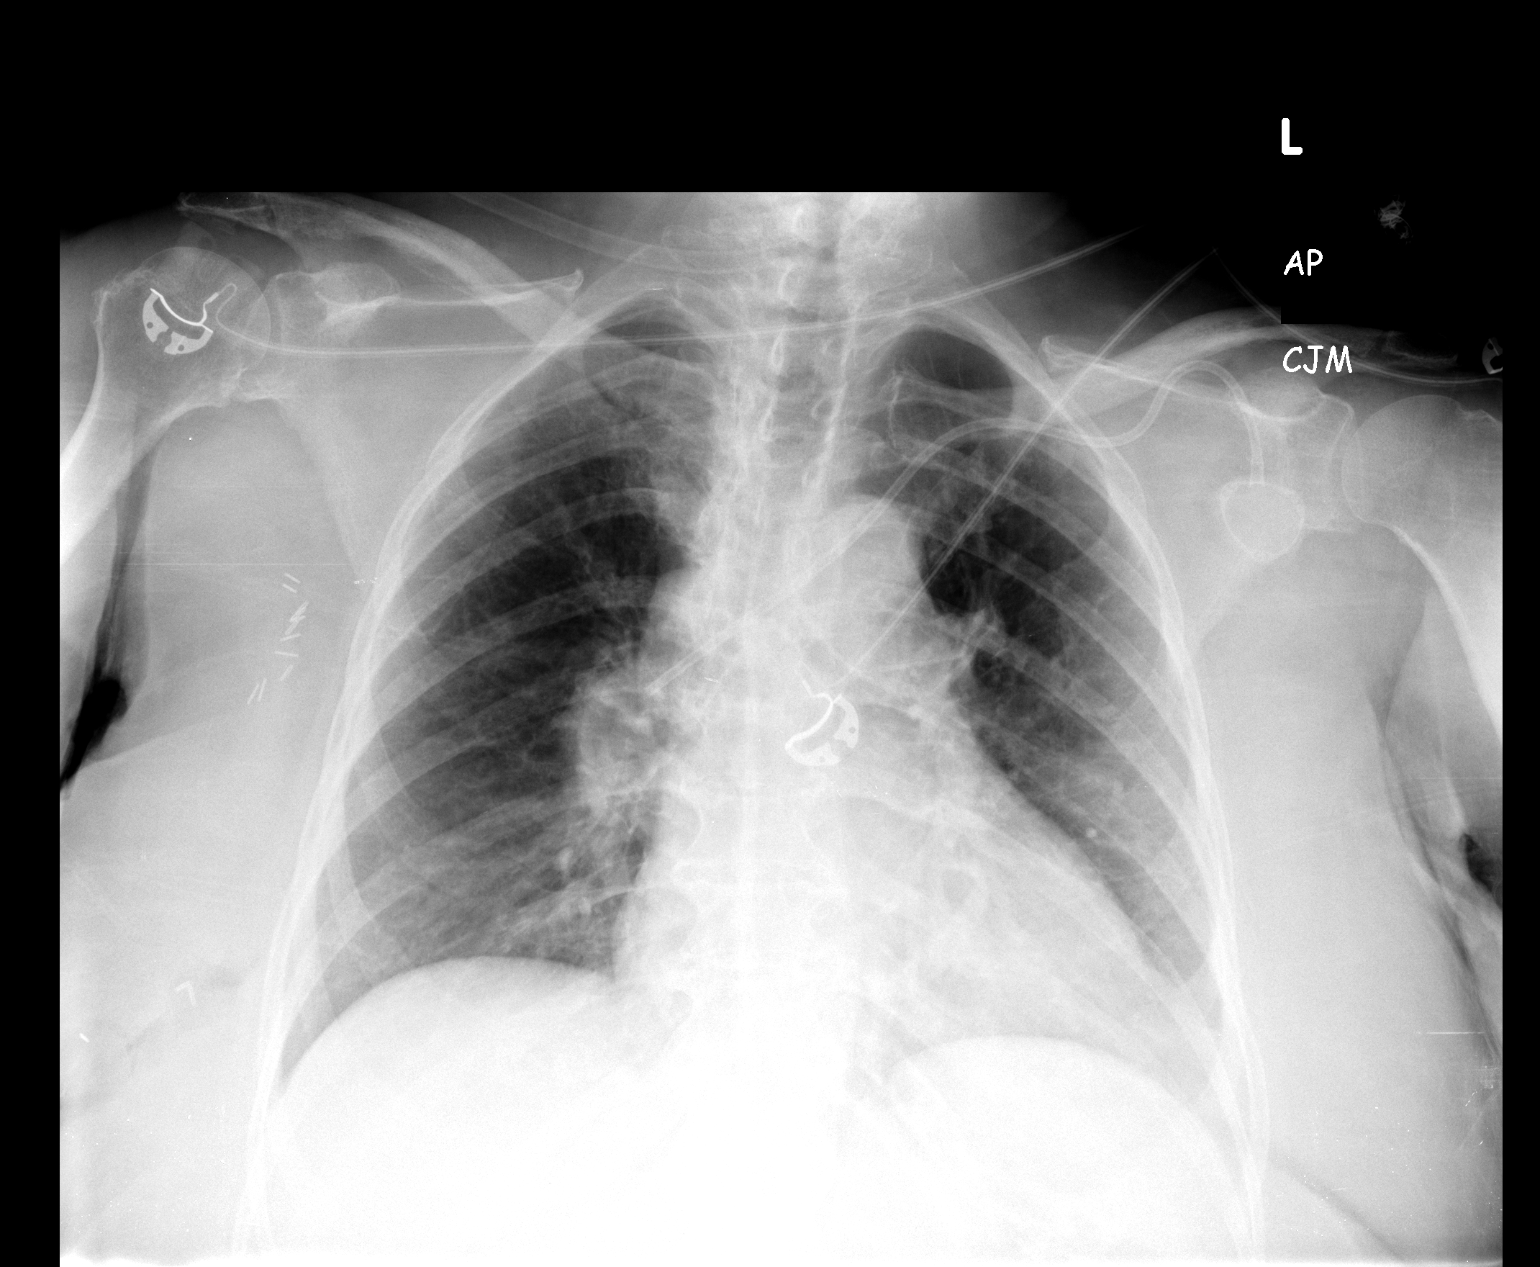

[1 of 1 positions shown; findings below may reference images not displayed]

FINDINGS: Low lung volumes. A left-sided port a catheter is appreciated with
tip projecting region of the superior vena cava. The catheter is via
a subclavian approach. There is no evidence of a pneumothorax. The
lungs otherwise clear without evidence of focal infiltrates
effusions or edema.
IMPRESSION: Patient is status post left-sided port a catheter insertion tip
projecting in the region superior vena cava. The technique taken
into consideration there is no evidence of acute cardiopulmonary
disease.

## 2015-07-24 ENCOUNTER — Other Ambulatory Visit: Payer: Self-pay

## 2015-07-24 DIAGNOSIS — C50411 Malignant neoplasm of upper-outer quadrant of right female breast: Secondary | ICD-10-CM

## 2015-07-25 ENCOUNTER — Other Ambulatory Visit (HOSPITAL_BASED_OUTPATIENT_CLINIC_OR_DEPARTMENT_OTHER): Payer: Medicare Other

## 2015-07-25 ENCOUNTER — Ambulatory Visit (HOSPITAL_BASED_OUTPATIENT_CLINIC_OR_DEPARTMENT_OTHER): Payer: Medicare Other

## 2015-07-25 ENCOUNTER — Telehealth: Payer: Self-pay | Admitting: Oncology

## 2015-07-25 ENCOUNTER — Ambulatory Visit (HOSPITAL_BASED_OUTPATIENT_CLINIC_OR_DEPARTMENT_OTHER): Payer: Medicare Other | Admitting: Oncology

## 2015-07-25 VITALS — BP 155/56 | HR 65 | Temp 97.6°F | Resp 18 | Ht 59.0 in | Wt 175.4 lb

## 2015-07-25 DIAGNOSIS — M8580 Other specified disorders of bone density and structure, unspecified site: Secondary | ICD-10-CM

## 2015-07-25 DIAGNOSIS — Z170 Estrogen receptor positive status [ER+]: Secondary | ICD-10-CM | POA: Diagnosis not present

## 2015-07-25 DIAGNOSIS — C50411 Malignant neoplasm of upper-outer quadrant of right female breast: Secondary | ICD-10-CM

## 2015-07-25 DIAGNOSIS — C773 Secondary and unspecified malignant neoplasm of axilla and upper limb lymph nodes: Secondary | ICD-10-CM | POA: Diagnosis not present

## 2015-07-25 LAB — COMPREHENSIVE METABOLIC PANEL
ALT: 11 U/L (ref 0–55)
AST: 12 U/L (ref 5–34)
Albumin: 3.7 g/dL (ref 3.5–5.0)
Alkaline Phosphatase: 92 U/L (ref 40–150)
Anion Gap: 10 mEq/L (ref 3–11)
BUN: 81.7 mg/dL — ABNORMAL HIGH (ref 7.0–26.0)
CO2: 26 mEq/L (ref 22–29)
Calcium: 9.8 mg/dL (ref 8.4–10.4)
Chloride: 104 mEq/L (ref 98–109)
Creatinine: 2.3 mg/dL — ABNORMAL HIGH (ref 0.6–1.1)
EGFR: 18 mL/min/{1.73_m2} — ABNORMAL LOW (ref >90)
Glucose: 127 mg/dl (ref 70–140)
Potassium: 4.1 mEq/L (ref 3.5–5.1)
Sodium: 141 mEq/L (ref 136–145)
Total Bilirubin: 0.47 mg/dL (ref 0.20–1.20)
Total Protein: 6.9 g/dL (ref 6.4–8.3)

## 2015-07-25 LAB — CBC WITH DIFFERENTIAL/PLATELET
BASO%: 0.2 % (ref 0.0–2.0)
Basophils Absolute: 0 10*3/uL (ref 0.0–0.1)
EOS%: 3.2 % (ref 0.0–7.0)
Eosinophils Absolute: 0.2 10*3/uL (ref 0.0–0.5)
HCT: 31.6 % — ABNORMAL LOW (ref 34.8–46.6)
HGB: 10.5 g/dL — ABNORMAL LOW (ref 11.6–15.9)
LYMPH%: 20.1 % (ref 14.0–49.7)
MCH: 30.6 pg (ref 25.1–34.0)
MCHC: 33.2 g/dL (ref 31.5–36.0)
MCV: 92.1 fL (ref 79.5–101.0)
MONO#: 0.5 10*3/uL (ref 0.1–0.9)
MONO%: 8.1 % (ref 0.0–14.0)
NEUT#: 4.2 10*3/uL (ref 1.5–6.5)
NEUT%: 68.4 % (ref 38.4–76.8)
Platelets: 148 10*3/uL (ref 145–400)
RBC: 3.43 10*6/uL — ABNORMAL LOW (ref 3.70–5.45)
RDW: 13.5 % (ref 11.2–14.5)
WBC: 6.2 10*3/uL (ref 3.9–10.3)
lymph#: 1.2 10*3/uL (ref 0.9–3.3)

## 2015-07-25 MED ORDER — SODIUM CHLORIDE 0.9 % IV SOLN: Freq: Once | INTRAVENOUS | Status: DC

## 2015-07-25 MED ORDER — ANASTROZOLE 1 MG PO TABS: 1.0000 mg | ORAL_TABLET | Freq: Every day | ORAL | Status: DC

## 2015-07-25 MED ORDER — DIPHENHYDRAMINE HCL 50 MG/ML IJ SOLN: 50.0000 mg | Freq: Once | INTRAMUSCULAR | Status: DC | PRN

## 2015-07-25 MED ORDER — EPINEPHRINE HCL 0.1 MG/ML IJ SOLN: 0.2500 mg | Freq: Once | INTRAMUSCULAR | Status: DC | PRN

## 2015-07-25 MED ORDER — SODIUM CHLORIDE 0.9 % IV SOLN: Freq: Once | INTRAVENOUS | Status: DC | PRN

## 2015-07-25 MED ORDER — ALBUTEROL SULFATE (2.5 MG/3ML) 0.083% IN NEBU
2.5000 mg | INHALATION_SOLUTION | Freq: Once | RESPIRATORY_TRACT | Status: DC | PRN
  Filled 2015-07-25: qty 3

## 2015-07-25 MED ORDER — DENOSUMAB 60 MG/ML ~~LOC~~ SOLN
60.0000 mg | Freq: Once | SUBCUTANEOUS | Status: AC
  Administered 2015-07-25: 60 mg via SUBCUTANEOUS
  Filled 2015-07-25: qty 1

## 2015-07-25 MED ORDER — DIPHENHYDRAMINE HCL 50 MG/ML IJ SOLN: 25.0000 mg | Freq: Once | INTRAMUSCULAR | Status: DC | PRN

## 2015-07-25 MED ORDER — METHYLPREDNISOLONE SODIUM SUCC 125 MG IJ SOLR: 125.0000 mg | Freq: Once | INTRAMUSCULAR | Status: DC | PRN

## 2015-07-25 NOTE — Patient Instructions (Signed)
Denosumab injection What is this medicine? DENOSUMAB (den oh sue mab) slows bone breakdown. Prolia is used to treat osteoporosis in women after menopause and in men. Xgeva is used to prevent bone fractures and other bone problems caused by cancer bone metastases. Xgeva is also used to treat giant cell tumor of the bone. This medicine may be used for other purposes; ask your health care provider or pharmacist if you have questions. COMMON BRAND NAME(S): Prolia, XGEVA What should I tell my health care provider before I take this medicine? They need to know if you have any of these conditions: -dental disease -eczema -infection or history of infections -kidney disease or on dialysis -low blood calcium or vitamin D -malabsorption syndrome -scheduled to have surgery or tooth extraction -taking medicine that contains denosumab -thyroid or parathyroid disease -an unusual reaction to denosumab, other medicines, foods, dyes, or preservatives -pregnant or trying to get pregnant -breast-feeding How should I use this medicine? This medicine is for injection under the skin. It is given by a health care professional in a hospital or clinic setting. If you are getting Prolia, a special MedGuide will be given to you by the pharmacist with each prescription and refill. Be sure to read this information carefully each time. For Prolia, talk to your pediatrician regarding the use of this medicine in children. Special care may be needed. For Xgeva, talk to your pediatrician regarding the use of this medicine in children. While this drug may be prescribed for children as young as 13 years for selected conditions, precautions do apply. Overdosage: If you think you've taken too much of this medicine contact a poison control center or emergency room at once. Overdosage: If you think you have taken too much of this medicine contact a poison control center or emergency room at once. NOTE: This medicine is only for  you. Do not share this medicine with others. What if I miss a dose? It is important not to miss your dose. Call your doctor or health care professional if you are unable to keep an appointment. What may interact with this medicine? Do not take this medicine with any of the following medications: -other medicines containing denosumab This medicine may also interact with the following medications: -medicines that suppress the immune system -medicines that treat cancer -steroid medicines like prednisone or cortisone This list may not describe all possible interactions. Give your health care provider a list of all the medicines, herbs, non-prescription drugs, or dietary supplements you use. Also tell them if you smoke, drink alcohol, or use illegal drugs. Some items may interact with your medicine. What should I watch for while using this medicine? Visit your doctor or health care professional for regular checks on your progress. Your doctor or health care professional may order blood tests and other tests to see how you are doing. Call your doctor or health care professional if you get a cold or other infection while receiving this medicine. Do not treat yourself. This medicine may decrease your body's ability to fight infection. You should make sure you get enough calcium and vitamin D while you are taking this medicine, unless your doctor tells you not to. Discuss the foods you eat and the vitamins you take with your health care professional. See your dentist regularly. Brush and floss your teeth as directed. Before you have any dental work done, tell your dentist you are receiving this medicine. Do not become pregnant while taking this medicine or for 5 months after stopping   it. Women should inform their doctor if they wish to become pregnant or think they might be pregnant. There is a potential for serious side effects to an unborn child. Talk to your health care professional or pharmacist for more  information. What side effects may I notice from receiving this medicine? Side effects that you should report to your doctor or health care professional as soon as possible: -allergic reactions like skin rash, itching or hives, swelling of the face, lips, or tongue -breathing problems -chest pain -fast, irregular heartbeat -feeling faint or lightheaded, falls -fever, chills, or any other sign of infection -muscle spasms, tightening, or twitches -numbness or tingling -skin blisters or bumps, or is dry, peels, or red -slow healing or unexplained pain in the mouth or jaw -unusual bleeding or bruising Side effects that usually do not require medical attention (Report these to your doctor or health care professional if they continue or are bothersome.): -muscle pain -stomach upset, gas This list may not describe all possible side effects. Call your doctor for medical advice about side effects. You may report side effects to FDA at 1-800-FDA-1088. Where should I keep my medicine? This medicine is only given in a clinic, doctor's office, or other health care setting and will not be stored at home. NOTE: This sheet is a summary. It may not cover all possible information. If you have questions about this medicine, talk to your doctor, pharmacist, or health care provider.  2015, Elsevier/Gold Standard. (2011-08-19 12:37:47)  

## 2015-07-25 NOTE — Telephone Encounter (Signed)
Gave patient avs report and appointment schedule for November 2017 and May 2018. Per 07/25/15 pof patient to have prolia today and last Tuesday of November with labs. Also per pof patient to f/u one year last Tuesday in May 2018. Injection added also for last Tuesday in May 2018 due to q35mo hyperlink for prolia attached to 07/25/15 pof. Patient also given bone density appointment for 08/21/15 @ [facility].

## 2015-07-25 NOTE — Progress Notes (Signed)
[place]  [phone] [phone]     ID: LILLYANNA [name] DOB: February 10, 1927  MR#: [id number]  XMD#:[id number]  PCP: [name], MD GYN: SU: Rolm Bookbinder OTHER MD: Thea Silversmith  CHIEF COMPLAINT: Estrogen receptor positive breast cancer  CURRENT TREATMENT: Anastrozole, denosumab   BREAST CANCER HISTORY: From Dr. Bernell List Khan's intake note 03/24/2013:  "Sabrina [name] is a 82 y.o. female. Who underwent a screening mammogram. She was found to have a right breast mass measuring 1 cm. There were no abnormalities on her physical exam. Patient had ultrasound performed that showed 1.3 x 1.2 x 1.0 cm hypoechoic mass in the upper outer quadrant of the right breast. In the right axilla there was an abnormal appearing lymph node measuring 1.65 1.4 x 0.8 cm. Both her biopsy. The lymph node was positive for metastatic carcinoma. The primary breast tumor biopsy showed invasive ductal carcinoma, grade 2, ER positive PR positive HER-2/neu equivocal with a proliferation marker Ki-67 15%.."  On 03/30/2013 the patient underwent right lumpectomy in right axillary lymph node sampling. The pathology ([id number]) showed a 1.4 cm invasive ductal carcinoma grade 3, present at the lateral and posterior margins. 3 axillary lymph nodes (non-sentinel lymph nodes) were sampled, one of which had a micrometastatic tumor deposits with extracapsular extension. Repeat HER-2 was positive, with a signals ratio of 2.54 and the number per cell of 3.55.  The patient was started on weekly Taxol and trastuzumab, but tolerated treatment poorly and develop bilateral DVTs and pulmonary embolism April 2015. At that time her chemotherapy was interrupted.  Her subsequent history is as detailed below.   INTERVAL HISTORY: Sabrina [name] returns today for follow up of her estrogen receptor positive breast cancer. She continues on anastrozole, with excellent tolerance. Hot flashes, vaginal dryness, and  arthralgias/myalgias are not issues for her. She obtains the drug at approximately $4 per month  REVIEW OF SYSTEMS: Sabrina [name] is not exercising. She did recently setting with the [employer], but she had to sit down during much of the concert because of back pain. She also hurts in her right shoulder and left ankle. These pains kind of come and go him and there are not more intense or more persistent than before. She tells me her blood sugar is well-controlled. She bruises easily. He is moderately constipated from the tramadol she uses. She takes Dulcolax and occasionally MiraLAX for that. A detailed review of systems today was otherwise noncontributory  PAST MEDICAL HISTORY: Past Medical History  Diagnosis Date  . Heart murmur     no known problems; states did not know she had murmur until age 9  . Immature cataract   . Arthritis     neck  . Wears partial dentures     lower  . Dental crowns present   . Hypertension     fluctuates, especially when stressed; has been on med. > 20 yr.  . Non-insulin dependent type 2 diabetes mellitus ([place])   . High cholesterol   . Chronic kidney disease (CKD), stage III (moderate)     nephrologist, Dr. [name]  . Breast cancer ([place]) 03/2013    right  . Radiation 09/06/13-10/20/13    Right Breast Cancer    PAST SURGICAL HISTORY: Past Surgical History  Procedure Laterality Date  . Tonsillectomy      as a child  . Breast surgery Right 11/1958    right breast biopsy benign  . Dilation and curettage of uterus    . Breast  lumpectomy with needle localization Right 03/30/2013    Procedure: BREAST LUMPECTOMY WITH NEEDLE LOCALIZATION;  Surgeon: Rolm Bookbinder, MD;  Location: [facility];  Service: General;  Laterality: Right;  . Axillary lymph node dissection Right 03/30/2013    Procedure: AXILLARY LYMPH NODE DISSECTION;  Surgeon: Rolm Bookbinder, MD;  Location: [place];  Service: General;  Laterality: Right;  . Re-excision of breast cancer,superior  margins Right 04/15/2013    Procedure: RE-EXCISION OF RIGHT BREAST  MARGINS;  Surgeon: Rolm Bookbinder, MD;  Location: [place];  Service: General;  Laterality: Right;  . Portacath placement N/A 04/15/2013    Procedure: INSERTION PORT-A-CATH;  Surgeon: Rolm Bookbinder, MD;  Location: [place];  Service: General;  Laterality: N/A;    FAMILY HISTORY Family History  Problem Relation Age of Onset  . Pneumonia Mother   . Heart attack Father   . Breast cancer Other 62    niece  . Breast cancer Other 28    niece   the patient's father died in his 26s, in the setting of multiple medical problems. The patient's mother died in her 88s from pneumonia. The patient had 4 sisters, one of whom has died from complications of diabetes. She had a brother who died at 29 months. There is no history of breast cancer in the immediate family although the patient does have 2 nieces with breast cancer. The patient has been tested for the BRCA mutations and her genetics was normal  GYNECOLOGIC HISTORY:  No LMP recorded. Patient is postmenopausal. Menarche age 75, the patient is GX P0. She went through menopause in her 69s, took hormone replacement until she was in her 41s.   SOCIAL HISTORY:  Sabrina [name] worked as the [employer] and locally. She and her husband Sabrina [name] greatly enjoy [employer] music [employer]. 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, now for the [employer].    ADVANCED DIRECTIVES: In place   HEALTH MAINTENANCE: Social History  Substance Use Topics  . Smoking status: Never Smoker   . Smokeless tobacco: Never Used     Comment: only smoked 2 packs cigarettes total  . Alcohol Use: 0.0 oz/week     Comment: 2-3 glasses wine/week     No Known Allergies  Current Outpatient Prescriptions  Medication Sig Dispense Refill  . anastrozole (ARIMIDEX) 1 MG tablet Take 1 tablet (1  mg total) by mouth daily. 90 tablet 12  . aspirin EC 81 MG tablet Take 1 tablet (81 mg total) by mouth daily.    [name] atorvastatin (LIPITOR) 10 MG tablet Take 10 mg by mouth daily.    [name] BIOTIN 5000 PO Take 1 tablet by mouth daily.    . cholecalciferol (VITAMIN D) 1000 UNITS tablet Take 1,000 Units by mouth daily.    . cloNIDine (CATAPRES) 0.2 MG tablet Take 0.2 mg by mouth 2 (two) times daily.    . colchicine 0.6 MG tablet Take 0.6 mg by mouth 2 (two) times daily.    [name] docusate sodium (COLACE) 100 MG capsule Take 100 mg by mouth 2 (two) times daily as needed for mild constipation. Reported on 03/21/2015    . furosemide (LASIX) 40 MG tablet TAKE 1 TABLET IN AM AND 1/2 TABLET IN THE PM. 60 tablet 3  . glipiZIDE (GLUCOTROL) 2.5 mg TABS tablet Take 0.5 tablets (2.5 mg total) by mouth 2 (two) times daily before a meal. 60 tablet 1  .  glucose blood test strip Use as instructed 100 each 12  . glucose monitoring kit (FREESTYLE) monitoring kit 1 each by Does not apply route as needed for other. 1 each 1  . IRON PO Take 65 mg by mouth daily.    [name] losartan (COZAAR) 25 MG tablet Take 25 mg by mouth daily.     . potassium chloride SA (K-DUR,KLOR-CON) 20 MEQ tablet TAKE 1 TABLET EACH DAY. (Patient taking differently: TAKE 1 TABLET EACH WEEK) 30 tablet 0  . traMADol (ULTRAM) 50 MG tablet Take 1 tablet (50 mg total) by mouth 2 (two) times daily. (Patient taking differently: Take 50 mg by mouth daily. ) 60 tablet 1  . verapamil (VERELAN PM) 240 MG 24 hr capsule Take 240 mg by mouth daily.      No current facility-administered medications for this visit.    OBJECTIVE: Elderly white woman In no acute distress  Filed Vitals:   07/25/15 1436  BP: 155/56  Pulse: 65  Temp: 97.6 F (36.4 C)  Resp: 18     Body mass index is 35.41 kg/(m^2).    ECOG FS:1 - Symptomatic but completely ambulatory  Sclerae unicteric, pupils round and equal Oropharynx clear and moist-- no thrush or other lesions No cervical or  supraclavicular adenopathy Lungs no rales or rhonchi Heart regular rate and rhythm Abd soft, nontender, positive bowel sounds MSK no focal spinal tenderness, no upper extremity lymphedema, limited range of motion right upper extremity Neuro: nonfocal, well oriented, appropriate affect Breasts: The right breast is status post lumpectomy and radiation. The cosmetic result is very good. There is no evidence of local recurrence. The right axilla is benign. The left breast is unremarkable.     LAB RESULTS:  CMP     Component Value Date/Time   NA 141 12/21/2014 1502   NA 142 09/14/2013 0515   K 4.7 12/21/2014 1502   K 4.5 09/14/2013 0515   CL 102 09/14/2013 0515   CO2 27 12/21/2014 1502   CO2 30 09/14/2013 0515   GLUCOSE 116 12/21/2014 1502   GLUCOSE 224* 09/14/2013 0515   BUN 66.7* 12/21/2014 1502   BUN 41* 09/14/2013 0515   CREATININE 1.9* 12/21/2014 1502   CREATININE 1.66* 09/14/2013 0515   CALCIUM 9.4 12/21/2014 1502   CALCIUM 9.2 09/14/2013 0515   PROT 6.7 12/21/2014 1502   PROT 5.9* 09/14/2013 0515   ALBUMIN 3.6 12/21/2014 1502   ALBUMIN 3.3* 09/14/2013 0515   AST 14 12/21/2014 1502   AST 10 09/14/2013 0515   ALT 12 12/21/2014 1502   ALT 13 09/14/2013 0515   ALKPHOS 78 12/21/2014 1502   ALKPHOS 72 09/14/2013 0515   BILITOT 0.51 12/21/2014 1502   BILITOT 0.7 09/14/2013 0515   GFRNONAA 27* 09/14/2013 0515   GFRAA 32* 09/14/2013 0515    I No results found for: SPEP  Lab Results  Component Value Date   WBC 6.2 07/25/2015   NEUTROABS 4.2 07/25/2015   HGB 10.5* 07/25/2015   HCT 31.6* 07/25/2015   MCV 92.1 07/25/2015   PLT 148 07/25/2015      Chemistry      Component Value Date/Time   NA 141 12/21/2014 1502   NA 142 09/14/2013 0515   K 4.7 12/21/2014 1502   K 4.5 09/14/2013 0515   CL 102 09/14/2013 0515   CO2 27 12/21/2014 1502   CO2 30 09/14/2013 0515   BUN 66.7* 12/21/2014 1502   BUN 41* 09/14/2013 0515   CREATININE 1.9* 12/21/2014 1502  CREATININE  1.66* 09/14/2013 0515      Component Value Date/Time   CALCIUM 9.4 12/21/2014 1502   CALCIUM 9.2 09/14/2013 0515   ALKPHOS 78 12/21/2014 1502   ALKPHOS 72 09/14/2013 0515   AST 14 12/21/2014 1502   AST 10 09/14/2013 0515   ALT 12 12/21/2014 1502   ALT 13 09/14/2013 0515   BILITOT 0.51 12/21/2014 1502   BILITOT 0.7 09/14/2013 0515       No results found for: LABCA2  No components found for: LABCA125  No results for input(s): INR in the last 168 hours.  Urinalysis    Component Value Date/Time   COLORURINE YELLOW 09/13/2013 0523   APPEARANCEUR CLEAR 09/13/2013 0523   LABSPEC 1.010 09/13/2013 0523   PHURINE 7.0 09/13/2013 0523   GLUCOSEU 100* 09/13/2013 0523   HGBUR NEGATIVE 09/13/2013 0523   BILIRUBINUR NEGATIVE 09/13/2013 0523   KETONESUR NEGATIVE 09/13/2013 0523   PROTEINUR NEGATIVE 09/13/2013 0523   UROBILINOGEN 0.2 09/13/2013 0523   NITRITE NEGATIVE 09/13/2013 0523   LEUKOCYTESUR MODERATE* 09/13/2013 0523    STUDIES: No results found.   CLINICAL DATA: 82 year old female with history of right breast cancer post lumpectomy 03/30/2013 followed by radiation therapy. History of benign excision in right axilla 02/14/2015.  EXAM: DIGITAL DIAGNOSTIC BILATERAL MAMMOGRAM WITH 3D TOMOSYNTHESIS AND CAD  COMPARISON: Previous exam(s).  ACR Breast Density Category b: There are scattered areas of fibroglandular density.  FINDINGS: No suspicious masses or calcifications are seen in either breast. Postsurgical changes are again seen in the outer right breast related to prior lumpectomy. A spot compression magnification tangential view of the lumpectomy site in the right breast was performed. There is no mammographic evidence of locally recurrent malignancy.  Mammographic images were processed with CAD.  IMPRESSION: No mammographic evidence of malignancy in either breast.  RECOMMENDATION: Diagnostic mammogram is suggested in 1 year. (Code:DM-B-01Y)  I  have discussed the findings and recommendations with the patient. Results were also provided in writing at the conclusion of the visit. If applicable, a reminder letter will be sent to the patient regarding the next appointment.  BI-RADS CATEGORY 2: Benign.   Electronically Signed  By: Everlean Alstrom M.D.  On: 03/28/2015 11:03  CLINICAL DATA: Two year follow-up of lung nodules. History of right breast cancer with lumpectomy and radiation.  EXAM: CT CHEST WITHOUT CONTRAST  TECHNIQUE: Multidetector CT imaging of the chest was performed following the standard protocol without IV contrast.  COMPARISON: 03/14/2014  FINDINGS: Mediastinum/Nodes: Normal heart size. Dense mitral annular calcification. No pericardial effusion. Interval removal of the left Port-A-Cath. No mediastinal or hilar lymphadenopathy. No axillary lymphadenopathy. Prior right axillary dissection.  Lungs/Pleura: Peripheral airspace disease in the anterolateral right upper lobe which is less conspicuous compared with the prior exam. Interval resolution of right middle lobe airspace disease. No new areas of airspace disease. No pleural effusion or pneumothorax.  Upper abdomen: Cholelithiasis on the scout radiograph.  Musculoskeletal: No lytic or sclerotic osseous lesion. No acute osseous abnormality. Mild diffuse thoracic spondylosis.  IMPRESSION: 1. Peripheral airspace disease in the anterolateral right upper lobe which is significantly less conspicuous compared with the prior examination as can be seen with fibrosis from radiation therapy for right-sided breast cancer.   Electronically Signed  By: Kathreen Devoid  On: 03/23/2015 08:23  ASSESSMENT: 82 y.o. [place] woman status post right lumpectomy and axillary lymph node sampling 03/30/2013 for a pT1c pN1a, stage IIA invasive ductal carcinoma, grade 3, estrogen receptor 100% positive, progesterone receptor 100% positive, with an  MIB-1 of 15%, and HER-2 amplified, with a signals ratio of 2.54 and the number per cell being 3.55.  (a) margins were positive but cleared with additional surgery February 2015 ([id number])  (1) adjuvant chemotherapy /immunotherapy with paclitaxel and trastuzumab weekly started 05/10/2013, discontinued after 2 doses because of poor tolerance  (2) ventilation/perfusion scan 06/10/2013 documented right lower lobe pulmonary emboli; Doppler ultrasound 06/11/2013 documented bilateral lower extremity DVTs; started on warfarin managed through Dr. Carlyle Lipa office  (3) anastrozole started June 2015  (4) adjuvant radiation completed 10/20/2013  (5) Trastuzumab resumed 09/07/2013, continued through 06/07/2014;  final echocardiogram on 03/30/2016showed a well preserved ejection fraction  (6) genetics testing April 2015 was normal and did not reveal a mutation in any of these genes: APC, ATM, AXIN2, BARD1, BMPRIA, BRCA1, BRCA2, BRIP1, CDH1, CDK4, CDKN2A, CHEK2, EPCAM, FANCC, MLH1, MSH2, MSH6, MUTYH, NBN, PALB2, PMS2, PTEN, RAD51C, SMAD4, STK11, TP53, VHL, and XRCC2  (7) osteopenia: DEXA scan 08/17/2013 showed a T score of -2.1--  (a) started Prolia (denosumab) 01/11/2014  PLAN: Suri is now 2 1/2 years out from definitive surgery for breast cancer with no evidence of disease recurrence. This is very favorable.  She is tolerating the anastrozole without any significant side effects and obtaining it at a good price. The plan is to continue this for 5 years.  We are dealing with the osteopenia problem through denosumab/ prolia, which she will receive today, and continue every 6 months while she is on anastrozole. She is due for repeat bone density and I'll schedule that for her late June  Otherwise she will return in 6 months for the pearly a shot and in 12 months for the next shot and then to see me at the same time  She knows to call for any problems that may develop before her next visit here.    Chauncey Cruel, MD   07/25/2015 2:43 PM

## 2015-08-14 DIAGNOSIS — H2513 Age-related nuclear cataract, bilateral: Secondary | ICD-10-CM | POA: Diagnosis not present

## 2015-08-14 DIAGNOSIS — E119 Type 2 diabetes mellitus without complications: Secondary | ICD-10-CM | POA: Diagnosis not present

## 2015-08-21 ENCOUNTER — Ambulatory Visit
Admission: RE | Admit: 2015-08-21 | Discharge: 2015-08-21 | Disposition: A | Discharge status: Home / Self Care | Payer: Medicare Other | Source: Ambulatory Visit | Attending: Oncology | Admitting: Oncology

## 2015-08-21 DIAGNOSIS — M85852 Other specified disorders of bone density and structure, left thigh: Secondary | ICD-10-CM | POA: Diagnosis not present

## 2015-08-21 DIAGNOSIS — Z780 Asymptomatic menopausal state: Secondary | ICD-10-CM | POA: Diagnosis not present

## 2015-08-21 DIAGNOSIS — C50411 Malignant neoplasm of upper-outer quadrant of right female breast: Secondary | ICD-10-CM

## 2015-08-21 DIAGNOSIS — M8580 Other specified disorders of bone density and structure, unspecified site: Secondary | ICD-10-CM

## 2015-08-22 ENCOUNTER — Other Ambulatory Visit: Payer: Self-pay | Admitting: Oncology

## 2015-11-23 DIAGNOSIS — Z23 Encounter for immunization: Secondary | ICD-10-CM | POA: Diagnosis not present

## 2015-11-23 DIAGNOSIS — Z7984 Long term (current) use of oral hypoglycemic drugs: Secondary | ICD-10-CM | POA: Diagnosis not present

## 2015-11-23 DIAGNOSIS — M722 Plantar fascial fibromatosis: Secondary | ICD-10-CM | POA: Diagnosis not present

## 2015-11-23 DIAGNOSIS — Z79899 Other long term (current) drug therapy: Secondary | ICD-10-CM | POA: Diagnosis not present

## 2015-11-23 DIAGNOSIS — N184 Chronic kidney disease, stage 4 (severe): Secondary | ICD-10-CM | POA: Diagnosis not present

## 2015-11-23 DIAGNOSIS — Z6835 Body mass index (BMI) 35.0-35.9, adult: Secondary | ICD-10-CM | POA: Diagnosis not present

## 2015-11-23 DIAGNOSIS — I129 Hypertensive chronic kidney disease with stage 1 through stage 4 chronic kidney disease, or unspecified chronic kidney disease: Secondary | ICD-10-CM | POA: Diagnosis not present

## 2015-11-23 DIAGNOSIS — E1121 Type 2 diabetes mellitus with diabetic nephropathy: Secondary | ICD-10-CM | POA: Diagnosis not present

## 2015-12-07 DIAGNOSIS — M722 Plantar fascial fibromatosis: Secondary | ICD-10-CM | POA: Diagnosis not present

## 2015-12-18 DIAGNOSIS — C50911 Malignant neoplasm of unspecified site of right female breast: Secondary | ICD-10-CM | POA: Diagnosis not present

## 2015-12-18 DIAGNOSIS — Z6836 Body mass index (BMI) 36.0-36.9, adult: Secondary | ICD-10-CM | POA: Diagnosis not present

## 2015-12-18 DIAGNOSIS — N183 Chronic kidney disease, stage 3 (moderate): Secondary | ICD-10-CM | POA: Diagnosis not present

## 2015-12-18 DIAGNOSIS — E1129 Type 2 diabetes mellitus with other diabetic kidney complication: Secondary | ICD-10-CM | POA: Diagnosis not present

## 2015-12-18 DIAGNOSIS — M109 Gout, unspecified: Secondary | ICD-10-CM | POA: Diagnosis not present

## 2015-12-18 DIAGNOSIS — I2699 Other pulmonary embolism without acute cor pulmonale: Secondary | ICD-10-CM | POA: Diagnosis not present

## 2015-12-18 DIAGNOSIS — I129 Hypertensive chronic kidney disease with stage 1 through stage 4 chronic kidney disease, or unspecified chronic kidney disease: Secondary | ICD-10-CM | POA: Diagnosis not present

## 2015-12-20 DIAGNOSIS — N184 Chronic kidney disease, stage 4 (severe): Secondary | ICD-10-CM | POA: Diagnosis not present

## 2015-12-20 DIAGNOSIS — I129 Hypertensive chronic kidney disease with stage 1 through stage 4 chronic kidney disease, or unspecified chronic kidney disease: Secondary | ICD-10-CM | POA: Diagnosis not present

## 2016-01-17 DIAGNOSIS — M722 Plantar fascial fibromatosis: Secondary | ICD-10-CM | POA: Diagnosis not present

## 2016-01-29 ENCOUNTER — Other Ambulatory Visit: Payer: Self-pay

## 2016-01-29 DIAGNOSIS — C50411 Malignant neoplasm of upper-outer quadrant of right female breast: Secondary | ICD-10-CM

## 2016-01-30 ENCOUNTER — Other Ambulatory Visit (HOSPITAL_BASED_OUTPATIENT_CLINIC_OR_DEPARTMENT_OTHER): Payer: Medicare Other

## 2016-01-30 ENCOUNTER — Ambulatory Visit (HOSPITAL_BASED_OUTPATIENT_CLINIC_OR_DEPARTMENT_OTHER): Payer: Medicare Other

## 2016-01-30 VITALS — BP 150/58 | HR 76 | Temp 97.6°F | Resp 20

## 2016-01-30 DIAGNOSIS — C773 Secondary and unspecified malignant neoplasm of axilla and upper limb lymph nodes: Secondary | ICD-10-CM

## 2016-01-30 DIAGNOSIS — M8580 Other specified disorders of bone density and structure, unspecified site: Secondary | ICD-10-CM

## 2016-01-30 DIAGNOSIS — C50411 Malignant neoplasm of upper-outer quadrant of right female breast: Secondary | ICD-10-CM | POA: Diagnosis not present

## 2016-01-30 LAB — COMPREHENSIVE METABOLIC PANEL
ALT: 16 U/L (ref 0–55)
AST: 15 U/L (ref 5–34)
Albumin: 3.4 g/dL — ABNORMAL LOW (ref 3.5–5.0)
Alkaline Phosphatase: 94 U/L (ref 40–150)
Anion Gap: 10 mEq/L (ref 3–11)
BUN: 66.3 mg/dL — ABNORMAL HIGH (ref 7.0–26.0)
CO2: 25 mEq/L (ref 22–29)
Calcium: 9.9 mg/dL (ref 8.4–10.4)
Chloride: 105 mEq/L (ref 98–109)
Creatinine: 2 mg/dL — ABNORMAL HIGH (ref 0.6–1.1)
EGFR: 22 mL/min/{1.73_m2} — ABNORMAL LOW (ref >90)
Glucose: 189 mg/dl — ABNORMAL HIGH (ref 70–140)
Potassium: 4.1 mEq/L (ref 3.5–5.1)
Sodium: 140 mEq/L (ref 136–145)
Total Bilirubin: 0.53 mg/dL (ref 0.20–1.20)
Total Protein: 7 g/dL (ref 6.4–8.3)

## 2016-01-30 LAB — CBC WITH DIFFERENTIAL/PLATELET
BASO%: 1.1 % (ref 0.0–2.0)
Basophils Absolute: 0.1 10*3/uL (ref 0.0–0.1)
EOS%: 3.7 % (ref 0.0–7.0)
Eosinophils Absolute: 0.3 10*3/uL (ref 0.0–0.5)
HCT: 32.7 % — ABNORMAL LOW (ref 34.8–46.6)
HGB: 10.5 g/dL — ABNORMAL LOW (ref 11.6–15.9)
LYMPH%: 20.1 % (ref 14.0–49.7)
MCH: 31.2 pg (ref 25.1–34.0)
MCHC: 32.3 g/dL (ref 31.5–36.0)
MCV: 96.7 fL (ref 79.5–101.0)
MONO#: 0.5 10*3/uL (ref 0.1–0.9)
MONO%: 7.1 % (ref 0.0–14.0)
NEUT#: 4.7 10*3/uL (ref 1.5–6.5)
NEUT%: 68 % (ref 38.4–76.8)
Platelets: 203 10*3/uL (ref 145–400)
RBC: 3.38 10*6/uL — ABNORMAL LOW (ref 3.70–5.45)
RDW: 14.5 % (ref 11.2–14.5)
WBC: 6.9 10*3/uL (ref 3.9–10.3)
lymph#: 1.4 10*3/uL (ref 0.9–3.3)

## 2016-01-30 MED ORDER — DENOSUMAB 60 MG/ML ~~LOC~~ SOLN
60.0000 mg | Freq: Once | SUBCUTANEOUS | Status: AC
  Administered 2016-01-30: 60 mg via SUBCUTANEOUS
  Filled 2016-01-30: qty 1

## 2016-01-30 NOTE — Patient Instructions (Signed)
Denosumab injection What is this medicine? DENOSUMAB (den oh sue mab) slows bone breakdown. Prolia is used to treat osteoporosis in women after menopause and in men. Xgeva is used to prevent bone fractures and other bone problems caused by cancer bone metastases. Xgeva is also used to treat giant cell tumor of the bone. This medicine may be used for other purposes; ask your health care provider or pharmacist if you have questions. COMMON BRAND NAME(S): Prolia, XGEVA What should I tell my health care provider before I take this medicine? They need to know if you have any of these conditions: -dental disease -eczema -infection or history of infections -kidney disease or on dialysis -low blood calcium or vitamin D -malabsorption syndrome -scheduled to have surgery or tooth extraction -taking medicine that contains denosumab -thyroid or parathyroid disease -an unusual reaction to denosumab, other medicines, foods, dyes, or preservatives -pregnant or trying to get pregnant -breast-feeding How should I use this medicine? This medicine is for injection under the skin. It is given by a health care professional in a hospital or clinic setting. If you are getting Prolia, a special MedGuide will be given to you by the pharmacist with each prescription and refill. Be sure to read this information carefully each time. For Prolia, talk to your pediatrician regarding the use of this medicine in children. Special care may be needed. For Xgeva, talk to your pediatrician regarding the use of this medicine in children. While this drug may be prescribed for children as young as 13 years for selected conditions, precautions do apply. Overdosage: If you think you've taken too much of this medicine contact a poison control center or emergency room at once. Overdosage: If you think you have taken too much of this medicine contact a poison control center or emergency room at once. NOTE: This medicine is only for  you. Do not share this medicine with others. What if I miss a dose? It is important not to miss your dose. Call your doctor or health care professional if you are unable to keep an appointment. What may interact with this medicine? Do not take this medicine with any of the following medications: -other medicines containing denosumab This medicine may also interact with the following medications: -medicines that suppress the immune system -medicines that treat cancer -steroid medicines like prednisone or cortisone This list may not describe all possible interactions. Give your health care provider a list of all the medicines, herbs, non-prescription drugs, or dietary supplements you use. Also tell them if you smoke, drink alcohol, or use illegal drugs. Some items may interact with your medicine. What should I watch for while using this medicine? Visit your doctor or health care professional for regular checks on your progress. Your doctor or health care professional may order blood tests and other tests to see how you are doing. Call your doctor or health care professional if you get a cold or other infection while receiving this medicine. Do not treat yourself. This medicine may decrease your body's ability to fight infection. You should make sure you get enough calcium and vitamin D while you are taking this medicine, unless your doctor tells you not to. Discuss the foods you eat and the vitamins you take with your health care professional. See your dentist regularly. Brush and floss your teeth as directed. Before you have any dental work done, tell your dentist you are receiving this medicine. Do not become pregnant while taking this medicine or for 5 months after stopping   it. Women should inform their doctor if they wish to become pregnant or think they might be pregnant. There is a potential for serious side effects to an unborn child. Talk to your health care professional or pharmacist for more  information. What side effects may I notice from receiving this medicine? Side effects that you should report to your doctor or health care professional as soon as possible: -allergic reactions like skin rash, itching or hives, swelling of the face, lips, or tongue -breathing problems -chest pain -fast, irregular heartbeat -feeling faint or lightheaded, falls -fever, chills, or any other sign of infection -muscle spasms, tightening, or twitches -numbness or tingling -skin blisters or bumps, or is dry, peels, or red -slow healing or unexplained pain in the mouth or jaw -unusual bleeding or bruising Side effects that usually do not require medical attention (Report these to your doctor or health care professional if they continue or are bothersome.): -muscle pain -stomach upset, gas This list may not describe all possible side effects. Call your doctor for medical advice about side effects. You may report side effects to FDA at 1-800-FDA-1088. Where should I keep my medicine? This medicine is only given in a clinic, doctor's office, or other health care setting and will not be stored at home. NOTE: This sheet is a summary. It may not cover all possible information. If you have questions about this medicine, talk to your doctor, pharmacist, or health care provider.  2015, Elsevier/Gold Standard. (2011-08-19 12:37:47)  

## 2016-02-05 DIAGNOSIS — H6123 Impacted cerumen, bilateral: Secondary | ICD-10-CM | POA: Diagnosis not present

## 2016-02-05 DIAGNOSIS — I129 Hypertensive chronic kidney disease with stage 1 through stage 4 chronic kidney disease, or unspecified chronic kidney disease: Secondary | ICD-10-CM | POA: Diagnosis not present

## 2016-02-05 DIAGNOSIS — N184 Chronic kidney disease, stage 4 (severe): Secondary | ICD-10-CM | POA: Diagnosis not present

## 2016-02-05 DIAGNOSIS — D1723 Benign lipomatous neoplasm of skin and subcutaneous tissue of right leg: Secondary | ICD-10-CM | POA: Diagnosis not present

## 2016-02-13 DIAGNOSIS — H6122 Impacted cerumen, left ear: Secondary | ICD-10-CM | POA: Diagnosis not present

## 2016-02-13 DIAGNOSIS — H9012 Conductive hearing loss, unilateral, left ear, with unrestricted hearing on the contralateral side: Secondary | ICD-10-CM | POA: Diagnosis not present

## 2016-02-14 DIAGNOSIS — H6122 Impacted cerumen, left ear: Secondary | ICD-10-CM | POA: Insufficient documentation

## 2016-02-14 DIAGNOSIS — IMO0001 Reserved for inherently not codable concepts without codable children: Secondary | ICD-10-CM | POA: Insufficient documentation

## 2016-02-19 ENCOUNTER — Other Ambulatory Visit: Payer: Self-pay | Admitting: Geriatric Medicine

## 2016-02-19 DIAGNOSIS — Z853 Personal history of malignant neoplasm of breast: Secondary | ICD-10-CM

## 2016-02-29 ENCOUNTER — Other Ambulatory Visit: Payer: Self-pay | Admitting: Nurse Practitioner

## 2016-03-06 DIAGNOSIS — C50919 Malignant neoplasm of unspecified site of unspecified female breast: Secondary | ICD-10-CM | POA: Diagnosis not present

## 2016-03-12 DIAGNOSIS — H9042 Sensorineural hearing loss, unilateral, left ear, with unrestricted hearing on the contralateral side: Secondary | ICD-10-CM | POA: Diagnosis not present

## 2016-03-12 DIAGNOSIS — H9192 Unspecified hearing loss, left ear: Secondary | ICD-10-CM | POA: Diagnosis not present

## 2016-03-12 DIAGNOSIS — H903 Sensorineural hearing loss, bilateral: Secondary | ICD-10-CM | POA: Diagnosis not present

## 2016-03-28 ENCOUNTER — Ambulatory Visit
Admission: RE | Admit: 2016-03-28 | Discharge: 2016-03-28 | Disposition: A | Discharge status: Home / Self Care | Payer: Medicare Other | Source: Ambulatory Visit | Attending: Geriatric Medicine | Admitting: Geriatric Medicine

## 2016-03-28 DIAGNOSIS — Z853 Personal history of malignant neoplasm of breast: Secondary | ICD-10-CM

## 2016-03-28 DIAGNOSIS — R922 Inconclusive mammogram: Secondary | ICD-10-CM | POA: Diagnosis not present

## 2016-04-08 ENCOUNTER — Other Ambulatory Visit: Payer: Self-pay | Admitting: Internal Medicine

## 2016-04-16 DIAGNOSIS — I129 Hypertensive chronic kidney disease with stage 1 through stage 4 chronic kidney disease, or unspecified chronic kidney disease: Secondary | ICD-10-CM | POA: Diagnosis not present

## 2016-04-16 DIAGNOSIS — E1121 Type 2 diabetes mellitus with diabetic nephropathy: Secondary | ICD-10-CM | POA: Diagnosis not present

## 2016-04-16 DIAGNOSIS — D649 Anemia, unspecified: Secondary | ICD-10-CM | POA: Diagnosis not present

## 2016-04-16 DIAGNOSIS — N184 Chronic kidney disease, stage 4 (severe): Secondary | ICD-10-CM | POA: Diagnosis not present

## 2016-04-16 DIAGNOSIS — Z0000 Encounter for general adult medical examination without abnormal findings: Secondary | ICD-10-CM | POA: Diagnosis not present

## 2016-04-17 ENCOUNTER — Other Ambulatory Visit: Payer: Self-pay | Admitting: Geriatric Medicine

## 2016-04-17 DIAGNOSIS — I350 Nonrheumatic aortic (valve) stenosis: Secondary | ICD-10-CM

## 2016-05-02 ENCOUNTER — Other Ambulatory Visit (HOSPITAL_COMMUNITY): Payer: Medicare Other

## 2016-05-06 ENCOUNTER — Ambulatory Visit (HOSPITAL_COMMUNITY): Payer: Medicare Other | Attending: Cardiovascular Disease

## 2016-05-06 ENCOUNTER — Other Ambulatory Visit: Payer: Self-pay

## 2016-05-06 DIAGNOSIS — I081 Rheumatic disorders of both mitral and tricuspid valves: Secondary | ICD-10-CM | POA: Insufficient documentation

## 2016-05-06 DIAGNOSIS — I350 Nonrheumatic aortic (valve) stenosis: Secondary | ICD-10-CM | POA: Diagnosis not present

## 2016-05-06 LAB — ECHOCARDIOGRAM COMPLETE
AO mean calculated velocity dopler: 144 cm/s
AV Area VTI index: 0.64 cm2/m2
AV Area VTI: 1.11 cm2
AV Area mean vel: 1.04 cm2
AV Mean grad: 9 mmHg
AV Peak grad: 17 mmHg
AV VEL mean LVOT/AV: 0.46
AV area mean vel ind: 0.6 cm2/m2
AV peak Index: 0.64
AV pk vel: 205 cm/s
AV vel: 1.12
Ao pk vel: 0.49 m/s
E decel time: 268 msec
E/e' ratio: 21.62
FS: 50 % — AB (ref 28–44)
IVS/LV PW RATIO, ED: 1.19
LA ID, A-P, ES: 36 mm
LA diam end sys: 36 mm
LA diam index: 2.07 cm/m2
LA vol A4C: 99.8 ml
LA vol index: 55.3 mL/m2
LA vol: 96.2 mL
LV E/e' medial: 21.62
LV E/e'average: 21.62
LV PW d: 12.1 mm — AB (ref 0.6–1.1)
LV e' LATERAL: 5.55 cm/s
LVOT SV: 57 mL
LVOT VTI: 25.2 cm
LVOT area: 2.27 cm2
LVOT diameter: 17 mm
LVOT peak VTI: 0.49 cm
LVOT peak vel: 99.8 cm/s
Lateral S' vel: 14.4 cm/s
MV Dec: 268
MV Peak grad: 6 mmHg
MV pk A vel: 129 m/s
MV pk E vel: 120 m/s
Reg peak vel: 317 cm/s
TDI e' lateral: 5.55
TDI e' medial: 7.18
TR max vel: 317 cm/s
VTI: 51.2 cm
Valve area index: 0.64
Valve area: 1.12 cm2

## 2016-05-09 ENCOUNTER — Encounter (HOSPITAL_COMMUNITY): Payer: Self-pay

## 2016-05-09 ENCOUNTER — Emergency Department (HOSPITAL_COMMUNITY)
Admission: EM | Admit: 2016-05-09 | Discharge: 2016-05-09 | Disposition: A | Discharge status: Home / Self Care | Payer: Medicare Other | Attending: Emergency Medicine | Admitting: Emergency Medicine

## 2016-05-09 DIAGNOSIS — I5032 Chronic diastolic (congestive) heart failure: Secondary | ICD-10-CM | POA: Diagnosis not present

## 2016-05-09 DIAGNOSIS — E1122 Type 2 diabetes mellitus with diabetic chronic kidney disease: Secondary | ICD-10-CM | POA: Diagnosis not present

## 2016-05-09 DIAGNOSIS — Y999 Unspecified external cause status: Secondary | ICD-10-CM | POA: Insufficient documentation

## 2016-05-09 DIAGNOSIS — Y939 Activity, unspecified: Secondary | ICD-10-CM | POA: Diagnosis not present

## 2016-05-09 DIAGNOSIS — I130 Hypertensive heart and chronic kidney disease with heart failure and stage 1 through stage 4 chronic kidney disease, or unspecified chronic kidney disease: Secondary | ICD-10-CM | POA: Diagnosis not present

## 2016-05-09 DIAGNOSIS — W228XXA Striking against or struck by other objects, initial encounter: Secondary | ICD-10-CM | POA: Insufficient documentation

## 2016-05-09 DIAGNOSIS — Z7984 Long term (current) use of oral hypoglycemic drugs: Secondary | ICD-10-CM | POA: Insufficient documentation

## 2016-05-09 DIAGNOSIS — Z853 Personal history of malignant neoplasm of breast: Secondary | ICD-10-CM | POA: Diagnosis not present

## 2016-05-09 DIAGNOSIS — N183 Chronic kidney disease, stage 3 (moderate): Secondary | ICD-10-CM | POA: Diagnosis not present

## 2016-05-09 DIAGNOSIS — Z79899 Other long term (current) drug therapy: Secondary | ICD-10-CM | POA: Insufficient documentation

## 2016-05-09 DIAGNOSIS — S0083XA Contusion of other part of head, initial encounter: Secondary | ICD-10-CM | POA: Insufficient documentation

## 2016-05-09 DIAGNOSIS — Z7982 Long term (current) use of aspirin: Secondary | ICD-10-CM | POA: Insufficient documentation

## 2016-05-09 DIAGNOSIS — S0990XA Unspecified injury of head, initial encounter: Secondary | ICD-10-CM | POA: Diagnosis not present

## 2016-05-09 DIAGNOSIS — Y929 Unspecified place or not applicable: Secondary | ICD-10-CM | POA: Diagnosis not present

## 2016-05-09 NOTE — ED Provider Notes (Signed)
[facility] DEPT Provider Note    By signing my name below, I, Sabrina [name], attest that this documentation has been prepared under the direction and in the presence of Sabrina Manifold, MD. Electronically Signed: Bea [name], ED Scribe. 05/09/16. 8:13 PM.    History   Chief Complaint Chief Complaint  Patient presents with  . Fall  . Head Injury   HPI  Sabrina [name] is a 83 y.o. female with PMHx of right breast cancer, CKD, HLD, HTN, T2DM, CHF who presents to the Emergency Department complaining of a fall that occurred about 4 hours ago. She reports associated left forehead swelling where she hit it on her hardwood floor. Pt reports she called EMS to help get her off the floor and they suggested she be checked here. She states she felt slightly unsteady once being back on her feet but not anymore than normal stating she walks with a cane at baseline. She has not taken anything for pain relief. She denies modifying factors. She denies LOC, nausea, vomiting, neck or back pain, HA, visual changes, numbness, tingling or weakness of any extremity, hip or lower extremity pain. She denies anticoagulant use.   Past Medical History:  Diagnosis Date  . Arthritis    neck  . Breast cancer ([place]) 03/2013   right  . Chronic kidney disease (CKD), stage III (moderate)    nephrologist, Dr. [name]  . Dental crowns present   . Heart murmur    no known problems; states did not know she had murmur until age 77  . High cholesterol   . Hypertension    fluctuates, especially when stressed; has been on med. > 20 yr.  . Immature cataract   . Non-insulin dependent type 2 diabetes mellitus ([place])   . Radiation 09/06/13-10/20/13   Right Breast Cancer  . Wears partial dentures    lower    Patient Active Problem List   Diagnosis Date Noted  . Lipoma of lower extremity 09/12/2014  . Abnormal x-ray 03/15/2014  . Chronic diastolic congestive heart failure ([place]) 02/23/2014  . Osteopenia  11/30/2013  . Hypoglycemia 09/13/2013  . Acute respiratory failure with hypoxia ([place]) 06/14/2013  . Pulmonary embolism ([facility]) 06/10/2013  . Nausea and vomiting 06/10/2013  . Accelerated hypertension 06/10/2013  . Aortic stenosis 04/22/2013  . Edema leg 04/22/2013  . Breast cancer of upper-outer quadrant of right female breast ([facility]) 03/19/2013    Past Surgical History:  Procedure Laterality Date  . AXILLARY LYMPH NODE DISSECTION Right 03/30/2013   Procedure: AXILLARY LYMPH NODE DISSECTION;  Surgeon: Rolm Bookbinder, MD;  Location: [facility];  Service: General;  Laterality: Right;  . BREAST LUMPECTOMY WITH NEEDLE LOCALIZATION Right 03/30/2013   Procedure: BREAST LUMPECTOMY WITH NEEDLE LOCALIZATION;  Surgeon: Rolm Bookbinder, MD;  Location: [facility];  Service: General;  Laterality: Right;  . BREAST SURGERY Right 11/1958   right breast biopsy benign  . DILATION AND CURETTAGE OF UTERUS    . PORTACATH PLACEMENT N/A 04/15/2013   Procedure: INSERTION PORT-A-CATH;  Surgeon: Rolm Bookbinder, MD;  Location: [facility];  Service: General;  Laterality: N/A;  . RE-EXCISION OF BREAST CANCER,SUPERIOR MARGINS Right 04/15/2013   Procedure: RE-EXCISION OF RIGHT BREAST  MARGINS;  Surgeon: Rolm Bookbinder, MD;  Location: [facility];  Service: General;  Laterality: Right;  . TONSILLECTOMY     as a child    OB History    Obstetric Comments   Menarche ae 14 No children Married 67 years  Home Medications    Prior to Admission medications   Medication Sig Start Date End Date Taking? Authorizing Provider  allopurinol (ZYLOPRIM) 100 MG tablet Take 1 tablet by mouth daily. 01/17/16  Yes Historical Provider, MD  anastrozole (ARIMIDEX) 1 MG tablet Take 1 tablet (1 mg total) by mouth daily. 07/25/15  Yes Chauncey Cruel, MD  aspirin EC 81 MG tablet Take 1 tablet (81 mg total) by mouth daily. 12/21/14  Yes Chauncey Cruel, MD  atorvastatin (LIPITOR) 10 MG tablet Take 10 mg  by mouth daily.   Yes Historical Provider, MD  BIOTIN 5000 PO Take 1 tablet by mouth daily.   Yes Historical Provider, MD  cholecalciferol (VITAMIN D) 1000 UNITS tablet Take 1,000 Units by mouth daily.   Yes Historical Provider, MD  cloNIDine (CATAPRES) 0.2 MG tablet Take 0.2 mg by mouth 2 (two) times daily.   Yes Historical Provider, MD  colchicine 0.6 MG tablet Take 0.6 mg by mouth as needed (GOUT).    Yes Historical Provider, MD  docusate sodium (COLACE) 100 MG capsule Take 100 mg by mouth 2 (two) times daily as needed for mild constipation. Reported on 03/21/2015   Yes Historical Provider, MD  furosemide (LASIX) 40 MG tablet TAKE 1 TABLET IN AM AND 1/2 TABLET IN THE PM. Patient taking differently: TAKE 1 TABLET EVERY OTHER DAY 04/08/16  Yes Jolaine Artist, MD  glipiZIDE (GLUCOTROL) 2.5 mg TABS tablet Take 0.5 tablets (2.5 mg total) by mouth 2 (two) times daily before a meal. 09/14/13  Yes Kinnie Feil, MD  IRON PO Take 65 mg by mouth daily.   Yes Historical Provider, MD  losartan (COZAAR) 25 MG tablet Take 25 mg by mouth daily.  11/02/13  Yes Historical Provider, MD  traMADol (ULTRAM) 50 MG tablet Take 1 tablet (50 mg total) by mouth 2 (two) times daily. Patient taking differently: Take 50 mg by mouth daily.  06/19/13  Yes Theodis Blaze, MD  verapamil (VERELAN PM) 240 MG 24 hr capsule Take 240 mg by mouth daily.    Yes Historical Provider, MD  vitamin C (ASCORBIC ACID) 500 MG tablet Take 500 mg by mouth daily.   Yes Historical Provider, MD  glucose blood test strip Use as instructed 09/14/13   Kinnie Feil, MD  glucose monitoring kit (FREESTYLE) monitoring kit 1 each by Does not apply route as needed for other. 09/14/13   Kinnie Feil, MD  potassium chloride SA (K-DUR,KLOR-CON) 20 MEQ tablet TAKE 1 TABLET EACH DAY. Patient not taking: Reported on 05/09/2016 03/08/15   Jolaine Artist, MD    Family History Family History  Problem Relation Age of Onset  . Pneumonia Mother   . Heart  attack Father   . Breast cancer Other 7    niece  . Breast cancer Other 1    niece    Social History Social History  Substance Use Topics  . Smoking status: Never Smoker  . Smokeless tobacco: Never Used     Comment: only smoked 2 packs cigarettes total  . Alcohol use 0.0 oz/week     Comment: 2-3 glasses wine/week     Allergies   Patient has no known allergies.   Review of Systems Review of Systems A complete 10 system review of systems was obtained and all systems are negative except as noted in the HPI and PMH.    Physical Exam Updated Vital Signs BP 191/62 (BP Location: Left Arm)   Pulse 86  Temp 97.6 F (36.4 C) (Oral)   Resp 19   Ht 4' 11"  (1.499 m)   Wt 170 lb (77.1 kg)   SpO2 96%   BMI 34.34 kg/m   Physical Exam  Constitutional: She is oriented to person, place, and time. She appears well-developed and well-nourished. No distress.  HENT:  Head: Normocephalic.  Hematoma left forehead.  Eyes: EOM are normal.  Neck: Normal range of motion.  No midline cervical tenderness.  Cardiovascular: Normal rate, regular rhythm and normal heart sounds.   Pulmonary/Chest: Effort normal and breath sounds normal.  Abdominal: Soft. She exhibits no distension. There is no tenderness.  Musculoskeletal: Normal range of motion.  Neurological: She is alert and oriented to person, place, and time. She displays normal reflexes. No cranial nerve deficit or sensory deficit. Coordination normal.  Skin: Skin is warm and dry.  Psychiatric: She has a normal mood and affect. Judgment normal.  Nursing note and vitals reviewed.    ED Treatments / Results  DIAGNOSTIC STUDIES: Oxygen Saturation is 96% on RA, adequate by my interpretation.   COORDINATION OF CARE: 8:12 PM- Reassured pt that her exam was normal but offered CT scan if she wanted, which she declined. Advised her to ice her forehead and follow up with PCP or return here for worsening symptoms. Return precautions  discussed. Pt verbalizes understanding and agrees to plan.  Medications - No data to display   Labs (all labs ordered are listed, but only abnormal results are displayed) Labs Reviewed - No data to display  EKG  EKG Interpretation None       Radiology  Procedures Procedures (including critical care time)  Medications Ordered in ED Medications - No data to display   Initial Impression / Assessment and Plan / ED Course  I have reviewed the triage vital signs and the nursing notes.  Pertinent labs & imaging results that were available during my care of the patient were reviewed by me and considered in my medical decision making (see chart for details).     I personally preformed the services scribed in my presence. The recorded information has been reviewed is accurate. Sabrina Manifold, MD.   Final Clinical Impressions(s) / ED Diagnoses   Final diagnoses:  Closed head injury, initial encounter    New Prescriptions New Prescriptions   No medications on file     Sabrina Manifold, MD 05/21/16 1351

## 2016-05-09 NOTE — ED Triage Notes (Signed)
Patient was doing something with her sock and toppled over onto a hardwood floor hitting her left knee and left forehead area. Patient has a hematoma to the left forehead. Patient denies LOC and denies taking anticoagulants.

## 2016-07-01 DIAGNOSIS — E1129 Type 2 diabetes mellitus with other diabetic kidney complication: Secondary | ICD-10-CM | POA: Diagnosis not present

## 2016-07-01 DIAGNOSIS — N183 Chronic kidney disease, stage 3 (moderate): Secondary | ICD-10-CM | POA: Diagnosis not present

## 2016-07-01 DIAGNOSIS — M109 Gout, unspecified: Secondary | ICD-10-CM | POA: Diagnosis not present

## 2016-07-01 DIAGNOSIS — I129 Hypertensive chronic kidney disease with stage 1 through stage 4 chronic kidney disease, or unspecified chronic kidney disease: Secondary | ICD-10-CM | POA: Diagnosis not present

## 2016-07-19 ENCOUNTER — Other Ambulatory Visit: Payer: Self-pay | Admitting: Emergency Medicine

## 2016-07-19 DIAGNOSIS — C50411 Malignant neoplasm of upper-outer quadrant of right female breast: Secondary | ICD-10-CM

## 2016-07-30 ENCOUNTER — Ambulatory Visit (HOSPITAL_BASED_OUTPATIENT_CLINIC_OR_DEPARTMENT_OTHER): Payer: Medicare Other | Admitting: Oncology

## 2016-07-30 ENCOUNTER — Ambulatory Visit (HOSPITAL_BASED_OUTPATIENT_CLINIC_OR_DEPARTMENT_OTHER): Payer: Medicare Other

## 2016-07-30 ENCOUNTER — Other Ambulatory Visit (HOSPITAL_BASED_OUTPATIENT_CLINIC_OR_DEPARTMENT_OTHER): Payer: Medicare Other

## 2016-07-30 VITALS — BP 167/53 | HR 65 | Temp 97.6°F | Resp 18 | Ht 59.0 in | Wt 179.2 lb

## 2016-07-30 DIAGNOSIS — C773 Secondary and unspecified malignant neoplasm of axilla and upper limb lymph nodes: Secondary | ICD-10-CM

## 2016-07-30 DIAGNOSIS — C50411 Malignant neoplasm of upper-outer quadrant of right female breast: Secondary | ICD-10-CM

## 2016-07-30 DIAGNOSIS — M8580 Other specified disorders of bone density and structure, unspecified site: Secondary | ICD-10-CM | POA: Diagnosis not present

## 2016-07-30 DIAGNOSIS — Z170 Estrogen receptor positive status [ER+]: Secondary | ICD-10-CM | POA: Diagnosis not present

## 2016-07-30 DIAGNOSIS — Z86718 Personal history of other venous thrombosis and embolism: Secondary | ICD-10-CM

## 2016-07-30 DIAGNOSIS — Z86711 Personal history of pulmonary embolism: Secondary | ICD-10-CM

## 2016-07-30 DIAGNOSIS — Z79811 Long term (current) use of aromatase inhibitors: Secondary | ICD-10-CM

## 2016-07-30 LAB — COMPREHENSIVE METABOLIC PANEL
ALT: 10 U/L (ref 0–55)
AST: 10 U/L (ref 5–34)
Albumin: 3.7 g/dL (ref 3.5–5.0)
Alkaline Phosphatase: 94 U/L (ref 40–150)
Anion Gap: 9 mEq/L (ref 3–11)
BUN: 71.3 mg/dL — ABNORMAL HIGH (ref 7.0–26.0)
CO2: 27 mEq/L (ref 22–29)
Calcium: 10 mg/dL (ref 8.4–10.4)
Chloride: 107 mEq/L (ref 98–109)
Creatinine: 2.1 mg/dL — ABNORMAL HIGH (ref 0.6–1.1)
EGFR: 21 mL/min/{1.73_m2} — ABNORMAL LOW (ref >90)
Glucose: 227 mg/dl — ABNORMAL HIGH (ref 70–140)
Potassium: 4.6 mEq/L (ref 3.5–5.1)
Sodium: 142 mEq/L (ref 136–145)
Total Bilirubin: 0.46 mg/dL (ref 0.20–1.20)
Total Protein: 6.9 g/dL (ref 6.4–8.3)

## 2016-07-30 LAB — CBC WITH DIFFERENTIAL/PLATELET
BASO%: 0.7 % (ref 0.0–2.0)
Basophils Absolute: 0 10*3/uL (ref 0.0–0.1)
EOS%: 2.7 % (ref 0.0–7.0)
Eosinophils Absolute: 0.2 10*3/uL (ref 0.0–0.5)
HCT: 32 % — ABNORMAL LOW (ref 34.8–46.6)
HGB: 10.7 g/dL — ABNORMAL LOW (ref 11.6–15.9)
LYMPH%: 20 % (ref 14.0–49.7)
MCH: 32.2 pg (ref 25.1–34.0)
MCHC: 33.3 g/dL (ref 31.5–36.0)
MCV: 96.5 fL (ref 79.5–101.0)
MONO#: 0.5 10*3/uL (ref 0.1–0.9)
MONO%: 6.9 % (ref 0.0–14.0)
NEUT#: 4.7 10*3/uL (ref 1.5–6.5)
NEUT%: 69.7 % (ref 38.4–76.8)
Platelets: 191 10*3/uL (ref 145–400)
RBC: 3.31 10*6/uL — ABNORMAL LOW (ref 3.70–5.45)
RDW: 14.4 % (ref 11.2–14.5)
WBC: 6.7 10*3/uL (ref 3.9–10.3)
lymph#: 1.3 10*3/uL (ref 0.9–3.3)

## 2016-07-30 MED ORDER — ANASTROZOLE 1 MG PO TABS: 1.0000 mg | ORAL_TABLET | Freq: Every day | ORAL | 12 refills | Status: DC

## 2016-07-30 MED ORDER — DENOSUMAB 60 MG/ML ~~LOC~~ SOLN
60.0000 mg | Freq: Once | SUBCUTANEOUS | Status: AC
  Administered 2016-07-30: 60 mg via SUBCUTANEOUS
  Filled 2016-07-30: qty 1

## 2016-07-30 NOTE — Patient Instructions (Signed)
Denosumab injection What is this medicine? DENOSUMAB (den oh sue mab) slows bone breakdown. Prolia is used to treat osteoporosis in women after menopause and in men. Xgeva is used to prevent bone fractures and other bone problems caused by cancer bone metastases. Xgeva is also used to treat giant cell tumor of the bone. This medicine may be used for other purposes; ask your health care provider or pharmacist if you have questions. COMMON BRAND NAME(S): Prolia, XGEVA What should I tell my health care provider before I take this medicine? They need to know if you have any of these conditions: -dental disease -eczema -infection or history of infections -kidney disease or on dialysis -low blood calcium or vitamin D -malabsorption syndrome -scheduled to have surgery or tooth extraction -taking medicine that contains denosumab -thyroid or parathyroid disease -an unusual reaction to denosumab, other medicines, foods, dyes, or preservatives -pregnant or trying to get pregnant -breast-feeding How should I use this medicine? This medicine is for injection under the skin. It is given by a health care professional in a hospital or clinic setting. If you are getting Prolia, a special MedGuide will be given to you by the pharmacist with each prescription and refill. Be sure to read this information carefully each time. For Prolia, talk to your pediatrician regarding the use of this medicine in children. Special care may be needed. For Xgeva, talk to your pediatrician regarding the use of this medicine in children. While this drug may be prescribed for children as young as 13 years for selected conditions, precautions do apply. Overdosage: If you think you've taken too much of this medicine contact a poison control center or emergency room at once. Overdosage: If you think you have taken too much of this medicine contact a poison control center or emergency room at once. NOTE: This medicine is only for  you. Do not share this medicine with others. What if I miss a dose? It is important not to miss your dose. Call your doctor or health care professional if you are unable to keep an appointment. What may interact with this medicine? Do not take this medicine with any of the following medications: -other medicines containing denosumab This medicine may also interact with the following medications: -medicines that suppress the immune system -medicines that treat cancer -steroid medicines like prednisone or cortisone This list may not describe all possible interactions. Give your health care provider a list of all the medicines, herbs, non-prescription drugs, or dietary supplements you use. Also tell them if you smoke, drink alcohol, or use illegal drugs. Some items may interact with your medicine. What should I watch for while using this medicine? Visit your doctor or health care professional for regular checks on your progress. Your doctor or health care professional may order blood tests and other tests to see how you are doing. Call your doctor or health care professional if you get a cold or other infection while receiving this medicine. Do not treat yourself. This medicine may decrease your body's ability to fight infection. You should make sure you get enough calcium and vitamin D while you are taking this medicine, unless your doctor tells you not to. Discuss the foods you eat and the vitamins you take with your health care professional. See your dentist regularly. Brush and floss your teeth as directed. Before you have any dental work done, tell your dentist you are receiving this medicine. Do not become pregnant while taking this medicine or for 5 months after stopping   it. Women should inform their doctor if they wish to become pregnant or think they might be pregnant. There is a potential for serious side effects to an unborn child. Talk to your health care professional or pharmacist for more  information. What side effects may I notice from receiving this medicine? Side effects that you should report to your doctor or health care professional as soon as possible: -allergic reactions like skin rash, itching or hives, swelling of the face, lips, or tongue -breathing problems -chest pain -fast, irregular heartbeat -feeling faint or lightheaded, falls -fever, chills, or any other sign of infection -muscle spasms, tightening, or twitches -numbness or tingling -skin blisters or bumps, or is dry, peels, or red -slow healing or unexplained pain in the mouth or jaw -unusual bleeding or bruising Side effects that usually do not require medical attention (Report these to your doctor or health care professional if they continue or are bothersome.): -muscle pain -stomach upset, gas This list may not describe all possible side effects. Call your doctor for medical advice about side effects. You may report side effects to FDA at 1-800-FDA-1088. Where should I keep my medicine? This medicine is only given in a clinic, doctor's office, or other health care setting and will not be stored at home. NOTE: This sheet is a summary. It may not cover all possible information. If you have questions about this medicine, talk to your doctor, pharmacist, or health care provider.  2015, Elsevier/Gold Standard. (2011-08-19 12:37:47)  

## 2016-07-30 NOTE — Progress Notes (Signed)
[facility]  [phone] [phone]     ID: Sabrina [name] DOB: October 08, 1926  MR#: [id number]  JFH#:[id number]  PCP: [name], MD GYN: SU: Rolm Bookbinder OTHER MD: Thea Silversmith  CHIEF COMPLAINT: Estrogen receptor positive breast cancer  CURRENT TREATMENT: Anastrozole, denosumab   BREAST CANCER HISTORY: From Dr. Bernell List Khan's intake note 03/24/2013:  "Sabrina [name] is a 83 y.o. female. Who underwent a screening mammogram. She was found to have a right breast mass measuring 1 cm. There were no abnormalities on her physical exam. Patient had ultrasound performed that showed 1.3 x 1.2 x 1.0 cm hypoechoic mass in the upper outer quadrant of the right breast. In the right axilla there was an abnormal appearing lymph node measuring 1.65 1.4 x 0.8 cm. Both her biopsy. The lymph node was positive for metastatic carcinoma. The primary breast tumor biopsy showed invasive ductal carcinoma, grade 2, ER positive PR positive HER-2/neu equivocal with a proliferation marker Ki-67 15%.."  On 03/30/2013 the patient underwent right lumpectomy in right axillary lymph node sampling. The pathology ([id number]) showed a 1.4 cm invasive ductal carcinoma grade 3, present at the lateral and posterior margins. 3 axillary lymph nodes (non-sentinel lymph nodes) were sampled, one of which had a micrometastatic tumor deposits with extracapsular extension. Repeat HER-2 was positive, with a signals ratio of 2.54 and the number per cell of 3.55.  The patient was started on weekly Taxol and trastuzumab, but tolerated treatment poorly and develop bilateral DVTs and pulmonary embolism April 2015. At that time her chemotherapy was interrupted.  Her subsequent history is as detailed below.   INTERVAL HISTORY: Sabrina [name] returns today for follow-up of her estrogen receptor positive breast cancer accompanied by her husband. She continues on anastrozole, with good tolerance.Hot flashes and  vaginal dryness are not a major issue. She never developed the arthralgias or myalgias that many patients can experience on this medication. She obtains it at a good price.  REVIEW OF SYSTEMS: Sabrina [name] fell down and hit her head recently and was evaluated in the emergency room. After she fell she was not able to get up. Was no major problem otherwise and she has not fallen since. She does describe yourself is mildly fatigued. She has pain in her knees and other areas of arthritis. She is very deconditioned and short of breath with walking stairs. She has a dry cough at times. She has gout. Her sugars are not well controlled. She is not exercising. A detailed review of systems today was otherwise stable.  PAST MEDICAL HISTORY: Past Medical History:  Diagnosis Date  . Arthritis    neck  . Breast cancer ([facility]) 03/2013   right  . Chronic kidney disease (CKD), stage III (moderate)    nephrologist, Dr. [name]  . Dental crowns present   . Heart murmur    no known problems; states did not know she had murmur until age 80  . High cholesterol   . Hypertension    fluctuates, especially when stressed; has been on med. > 20 yr.  . Immature cataract   . Non-insulin dependent type 2 diabetes mellitus ([facility])   . Radiation 09/06/13-10/20/13   Right Breast Cancer  . Wears partial dentures    lower    PAST SURGICAL HISTORY: Past Surgical History:  Procedure Laterality Date  . AXILLARY LYMPH NODE DISSECTION Right 03/30/2013   Procedure: AXILLARY LYMPH NODE DISSECTION;  Surgeon: Rolm Bookbinder, MD;  Location: [facility];  Service: General;  Laterality: Right;  . BREAST LUMPECTOMY WITH NEEDLE LOCALIZATION Right 03/30/2013   Procedure: BREAST LUMPECTOMY WITH NEEDLE LOCALIZATION;  Surgeon: Rolm Bookbinder, MD;  Location: [facility];  Service: General;  Laterality: Right;  . BREAST SURGERY Right 11/1958   right breast biopsy benign  . DILATION AND CURETTAGE OF UTERUS    . PORTACATH PLACEMENT N/A 04/15/2013    Procedure: INSERTION PORT-A-CATH;  Surgeon: Rolm Bookbinder, MD;  Location: [facility];  Service: General;  Laterality: N/A;  . RE-EXCISION OF BREAST CANCER,SUPERIOR MARGINS Right 04/15/2013   Procedure: RE-EXCISION OF RIGHT BREAST  MARGINS;  Surgeon: Rolm Bookbinder, MD;  Location: [facility];  Service: General;  Laterality: Right;  . TONSILLECTOMY     as a child    FAMILY HISTORY Family History  Problem Relation Age of Onset  . Pneumonia Mother   . Heart attack Father   . Breast cancer Other 50       niece  . Breast cancer Other 58       niece   the patient's father died in his 62s, in the setting of multiple medical problems. The patient's mother died in her 35s from pneumonia. The patient had 4 sisters, one of whom has died from complications of diabetes. She had a brother who died at 19 months. There is no history of breast cancer in the immediate family although the patient does have 2 nieces with breast cancer. The patient has been tested for the BRCA mutations and her genetics was normal  GYNECOLOGIC HISTORY:  No LMP recorded. Patient is postmenopausal. Menarche age 66, the patient is GX P0. She went through menopause in her 65s, took hormone replacement until she was in her 40s.   SOCIAL HISTORY:  Sabrina [name] worked as the [employer] and locally. She and her husband Sabrina [name] greatly enjoy [employer]. 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, now for the [employer].    ADVANCED DIRECTIVES: In place   HEALTH MAINTENANCE: Social History  Substance Use Topics  . Smoking status: Never Smoker  . Smokeless tobacco: Never Used     Comment: only smoked 2 packs cigarettes total  . Alcohol use 0.0 oz/week     Comment: 2-3 glasses wine/week     No Known Allergies  Current Outpatient Prescriptions  Medication Sig Dispense Refill  . allopurinol  (ZYLOPRIM) 100 MG tablet Take 1 tablet by mouth daily.    [name] anastrozole (ARIMIDEX) 1 MG tablet Take 1 tablet (1 mg total) by mouth daily. 90 tablet 12  . aspirin EC 81 MG tablet Take 1 tablet (81 mg total) by mouth daily.    [name] atorvastatin (LIPITOR) 10 MG tablet Take 10 mg by mouth daily.    [name] BIOTIN 5000 PO Take 1 tablet by mouth daily.    . cholecalciferol (VITAMIN D) 1000 UNITS tablet Take 1,000 Units by mouth daily.    . cloNIDine (CATAPRES) 0.2 MG tablet Take 0.2 mg by mouth 2 (two) times daily.    . colchicine 0.6 MG tablet Take 0.6 mg by mouth as needed (GOUT).     [name] docusate sodium (COLACE) 100 MG capsule Take 100 mg by mouth 2 (two) times daily as needed for mild constipation. Reported on 03/21/2015    . furosemide (LASIX) 40 MG tablet TAKE 1 TABLET IN AM AND 1/2 TABLET IN THE PM. (Patient taking differently: TAKE 1 TABLET EVERY  OTHER DAY) 60 tablet 0  . glipiZIDE (GLUCOTROL) 2.5 mg TABS tablet Take 0.5 tablets (2.5 mg total) by mouth 2 (two) times daily before a meal. 60 tablet 1  . glucose blood test strip Use as instructed 100 each 12  . glucose monitoring kit (FREESTYLE) monitoring kit 1 each by Does not apply route as needed for other. 1 each 1  . IRON PO Take 65 mg by mouth daily.    [name] losartan (COZAAR) 25 MG tablet Take 25 mg by mouth daily.     . potassium chloride SA (K-DUR,KLOR-CON) 20 MEQ tablet TAKE 1 TABLET EACH DAY. (Patient not taking: Reported on 05/09/2016) 30 tablet 0  . traMADol (ULTRAM) 50 MG tablet Take 1 tablet (50 mg total) by mouth 2 (two) times daily. (Patient taking differently: Take 50 mg by mouth daily. ) 60 tablet 1  . verapamil (VERELAN PM) 240 MG 24 hr capsule Take 240 mg by mouth daily.     . vitamin C (ASCORBIC ACID) 500 MG tablet Take 500 mg by mouth daily.     No current facility-administered medications for this visit.     OBJECTIVE: Elderly white woman Who appears stated age 54:   07/30/16 1307  BP: (!) 167/53  Pulse: 65  Resp: 18  Temp:  97.6 F (36.4 C)     Body mass index is 36.19 kg/m.    ECOG FS:1 - Symptomatic but completely ambulatory  Sclerae unicteric, EOMs intact Oropharynx clear and moist No cervical or supraclavicular adenopathy Lungs no rales or rhonchi Heart regular rate and rhythm Abd soft, obese, nontender, positive bowel sounds MSK no focal spinal tenderness, no upper extremity lymphedema Neuro: nonfocal, well oriented, positive affect Breasts: The right breast is undergone lumpectomy and radiation with no evidence of local recurrence. The left breast is unremarkable. Both axillae are benign.    LAB RESULTS:  CMP     Component Value Date/Time   NA 140 01/30/2016 1215   K 4.1 01/30/2016 1215   CL 102 09/14/2013 0515   CO2 25 01/30/2016 1215   GLUCOSE 189 (H) 01/30/2016 1215   BUN 66.3 (H) 01/30/2016 1215   CREATININE 2.0 (H) 01/30/2016 1215   CALCIUM 9.9 01/30/2016 1215   PROT 7.0 01/30/2016 1215   ALBUMIN 3.4 (L) 01/30/2016 1215   AST 15 01/30/2016 1215   ALT 16 01/30/2016 1215   ALKPHOS 94 01/30/2016 1215   BILITOT 0.53 01/30/2016 1215   GFRNONAA 27 (L) 09/14/2013 0515   GFRAA 32 (L) 09/14/2013 0515    I No results found for: SPEP  Lab Results  Component Value Date   WBC 6.7 07/30/2016   NEUTROABS 4.7 07/30/2016   HGB 10.7 (L) 07/30/2016   HCT 32.0 (L) 07/30/2016   MCV 96.5 07/30/2016   PLT 191 07/30/2016      Chemistry      Component Value Date/Time   NA 140 01/30/2016 1215   K 4.1 01/30/2016 1215   CL 102 09/14/2013 0515   CO2 25 01/30/2016 1215   BUN 66.3 (H) 01/30/2016 1215   CREATININE 2.0 (H) 01/30/2016 1215      Component Value Date/Time   CALCIUM 9.9 01/30/2016 1215   ALKPHOS 94 01/30/2016 1215   AST 15 01/30/2016 1215   ALT 16 01/30/2016 1215   BILITOT 0.53 01/30/2016 1215       No results found for: LABCA2  No components found for: TTSVX793  No results for input(s): INR in the last 168 hours.  Urinalysis    Component Value Date/Time    COLORURINE YELLOW 09/13/2013 0523   APPEARANCEUR CLEAR 09/13/2013 0523   LABSPEC 1.010 09/13/2013 0523   PHURINE 7.0 09/13/2013 0523   GLUCOSEU 100 (A) 09/13/2013 0523   HGBUR NEGATIVE 09/13/2013 0523   BILIRUBINUR NEGATIVE 09/13/2013 0523   KETONESUR NEGATIVE 09/13/2013 0523   PROTEINUR NEGATIVE 09/13/2013 0523   UROBILINOGEN 0.2 09/13/2013 0523   NITRITE NEGATIVE 09/13/2013 0523   LEUKOCYTESUR MODERATE (A) 09/13/2013 0523    STUDIES: Bilateral mammography with tomography 03/28/2016 found the breast density to be category C-- there was no evidence of malignancy.  ASSESSMENT: 83 y.o. [place] woman status post right lumpectomy and axillary lymph node sampling 03/30/2013 for a pT1c pN1a, stage IIA invasive ductal carcinoma, grade 3, estrogen receptor 100% positive, progesterone receptor 100% positive, with an MIB-1 of 15%, and HER-2 amplified, with a signals ratio of 2.54 and the number per cell being 3.55.  (a) margins were positive but cleared with additional surgery February 2015 ([id number])  (1) adjuvant chemotherapy /immunotherapy with paclitaxel and trastuzumab weekly started 05/10/2013, discontinued after 2 doses because of poor tolerance  (2) ventilation/perfusion scan 06/10/2013 documented right lower lobe pulmonary emboli; Doppler ultrasound 06/11/2013 documented bilateral lower extremity DVTs; started on warfarin managed through Dr. Carlyle Lipa office  (3) anastrozole started June 2015  (4) adjuvant radiation completed 10/20/2013  (5) Trastuzumab resumed 09/07/2013, continued through 06/07/2014;  final echocardiogram on 03/30/2016showed a well preserved ejection fraction  (6) genetics testing April 2015 was normal and did not reveal a mutation in any of these genes: APC, ATM, AXIN2, BARD1, BMPRIA, BRCA1, BRCA2, BRIP1, CDH1, CDK4, CDKN2A, CHEK2, EPCAM, FANCC, MLH1, MSH2, MSH6, MUTYH, NBN, PALB2, PMS2, PTEN, RAD51C, SMAD4, STK11, TP53, VHL, and XRCC2  (7) osteopenia: DEXA  scan 08/17/2013 showed a T score of -2.1--  (a) started Prolia (denosumab) 01/11/2014  PLAN: Theona Is now 3-1/2 years out from definitive surgery for her breast cancer with no evidence of disease recurrence. This is very favorable.  She tolerates the anastrozole well. She is 3 years into the 5 years planned of that surgery at this point.  She also receives Prolia/denosumab every 6 months. She tolerates that with no side effects and will receive a dose today.  She will return in 6 months for her next dose of denosumab and then she will see me again in 6 months after that for the next dose. She knows to call for any problems that may develop before that visit.   Chauncey Cruel, MD   07/30/2016 1:18 PM

## 2016-08-05 DIAGNOSIS — H903 Sensorineural hearing loss, bilateral: Secondary | ICD-10-CM | POA: Diagnosis not present

## 2016-09-17 DIAGNOSIS — C50412 Malignant neoplasm of upper-outer quadrant of left female breast: Secondary | ICD-10-CM | POA: Diagnosis not present

## 2016-09-17 DIAGNOSIS — E1121 Type 2 diabetes mellitus with diabetic nephropathy: Secondary | ICD-10-CM | POA: Diagnosis not present

## 2016-09-17 DIAGNOSIS — N184 Chronic kidney disease, stage 4 (severe): Secondary | ICD-10-CM | POA: Diagnosis not present

## 2016-09-17 DIAGNOSIS — I129 Hypertensive chronic kidney disease with stage 1 through stage 4 chronic kidney disease, or unspecified chronic kidney disease: Secondary | ICD-10-CM | POA: Diagnosis not present

## 2016-09-19 DIAGNOSIS — H52203 Unspecified astigmatism, bilateral: Secondary | ICD-10-CM | POA: Diagnosis not present

## 2016-09-19 DIAGNOSIS — H2513 Age-related nuclear cataract, bilateral: Secondary | ICD-10-CM | POA: Diagnosis not present

## 2016-09-19 DIAGNOSIS — E119 Type 2 diabetes mellitus without complications: Secondary | ICD-10-CM | POA: Diagnosis not present

## 2016-09-19 DIAGNOSIS — H5203 Hypermetropia, bilateral: Secondary | ICD-10-CM | POA: Diagnosis not present

## 2016-10-14 DIAGNOSIS — E1121 Type 2 diabetes mellitus with diabetic nephropathy: Secondary | ICD-10-CM | POA: Diagnosis not present

## 2016-10-14 DIAGNOSIS — N184 Chronic kidney disease, stage 4 (severe): Secondary | ICD-10-CM | POA: Diagnosis not present

## 2016-10-14 DIAGNOSIS — I129 Hypertensive chronic kidney disease with stage 1 through stage 4 chronic kidney disease, or unspecified chronic kidney disease: Secondary | ICD-10-CM | POA: Diagnosis not present

## 2016-10-31 DIAGNOSIS — N184 Chronic kidney disease, stage 4 (severe): Secondary | ICD-10-CM | POA: Diagnosis not present

## 2016-10-31 DIAGNOSIS — C50412 Malignant neoplasm of upper-outer quadrant of left female breast: Secondary | ICD-10-CM | POA: Diagnosis not present

## 2016-10-31 DIAGNOSIS — E1121 Type 2 diabetes mellitus with diabetic nephropathy: Secondary | ICD-10-CM | POA: Diagnosis not present

## 2016-10-31 DIAGNOSIS — I129 Hypertensive chronic kidney disease with stage 1 through stage 4 chronic kidney disease, or unspecified chronic kidney disease: Secondary | ICD-10-CM | POA: Diagnosis not present

## 2016-12-25 DIAGNOSIS — N183 Chronic kidney disease, stage 3 (moderate): Secondary | ICD-10-CM | POA: Diagnosis not present

## 2016-12-25 DIAGNOSIS — M109 Gout, unspecified: Secondary | ICD-10-CM | POA: Diagnosis not present

## 2016-12-25 DIAGNOSIS — I129 Hypertensive chronic kidney disease with stage 1 through stage 4 chronic kidney disease, or unspecified chronic kidney disease: Secondary | ICD-10-CM | POA: Diagnosis not present

## 2016-12-25 DIAGNOSIS — E1129 Type 2 diabetes mellitus with other diabetic kidney complication: Secondary | ICD-10-CM | POA: Diagnosis not present

## 2016-12-26 DIAGNOSIS — Z23 Encounter for immunization: Secondary | ICD-10-CM | POA: Diagnosis not present

## 2017-01-29 ENCOUNTER — Other Ambulatory Visit: Payer: Self-pay

## 2017-01-29 DIAGNOSIS — C50411 Malignant neoplasm of upper-outer quadrant of right female breast: Secondary | ICD-10-CM

## 2017-01-29 DIAGNOSIS — Z170 Estrogen receptor positive status [ER+]: Secondary | ICD-10-CM

## 2017-01-30 ENCOUNTER — Other Ambulatory Visit (HOSPITAL_BASED_OUTPATIENT_CLINIC_OR_DEPARTMENT_OTHER): Payer: Medicare Other

## 2017-01-30 ENCOUNTER — Ambulatory Visit (HOSPITAL_BASED_OUTPATIENT_CLINIC_OR_DEPARTMENT_OTHER): Payer: Medicare Other

## 2017-01-30 VITALS — BP 159/61 | HR 77 | Temp 97.7°F | Resp 18

## 2017-01-30 DIAGNOSIS — C50411 Malignant neoplasm of upper-outer quadrant of right female breast: Secondary | ICD-10-CM

## 2017-01-30 DIAGNOSIS — Z170 Estrogen receptor positive status [ER+]: Secondary | ICD-10-CM

## 2017-01-30 DIAGNOSIS — C773 Secondary and unspecified malignant neoplasm of axilla and upper limb lymph nodes: Secondary | ICD-10-CM | POA: Diagnosis not present

## 2017-01-30 DIAGNOSIS — M8580 Other specified disorders of bone density and structure, unspecified site: Secondary | ICD-10-CM

## 2017-01-30 LAB — CBC WITH DIFFERENTIAL/PLATELET
BASO%: 0.3 % (ref 0.0–2.0)
Basophils Absolute: 0 10*3/uL (ref 0.0–0.1)
EOS%: 3.3 % (ref 0.0–7.0)
Eosinophils Absolute: 0.3 10*3/uL (ref 0.0–0.5)
HCT: 32.2 % — ABNORMAL LOW (ref 34.8–46.6)
HGB: 10.3 g/dL — ABNORMAL LOW (ref 11.6–15.9)
LYMPH%: 24.8 % (ref 14.0–49.7)
MCH: 31.8 pg (ref 25.1–34.0)
MCHC: 32 g/dL (ref 31.5–36.0)
MCV: 99.4 fL (ref 79.5–101.0)
MONO#: 0.4 10*3/uL (ref 0.1–0.9)
MONO%: 5.4 % (ref 0.0–14.0)
NEUT#: 5.3 10*3/uL (ref 1.5–6.5)
NEUT%: 66.2 % (ref 38.4–76.8)
Platelets: 179 10*3/uL (ref 145–400)
RBC: 3.24 10*6/uL — ABNORMAL LOW (ref 3.70–5.45)
RDW: 13.8 % (ref 11.2–14.5)
WBC: 8 10*3/uL (ref 3.9–10.3)
lymph#: 2 10*3/uL (ref 0.9–3.3)

## 2017-01-30 LAB — COMPREHENSIVE METABOLIC PANEL
ALT: 12 U/L (ref 0–55)
AST: 13 U/L (ref 5–34)
Albumin: 3.6 g/dL (ref 3.5–5.0)
Alkaline Phosphatase: 94 U/L (ref 40–150)
Anion Gap: 10 mEq/L (ref 3–11)
BUN: 74.3 mg/dL — ABNORMAL HIGH (ref 7.0–26.0)
CO2: 26 mEq/L (ref 22–29)
Calcium: 10.2 mg/dL (ref 8.4–10.4)
Chloride: 104 mEq/L (ref 98–109)
Creatinine: 2.2 mg/dL — ABNORMAL HIGH (ref 0.6–1.1)
EGFR: 19 mL/min/{1.73_m2} — ABNORMAL LOW (ref >60)
Glucose: 277 mg/dl — ABNORMAL HIGH (ref 70–140)
Potassium: 4.4 mEq/L (ref 3.5–5.1)
Sodium: 140 mEq/L (ref 136–145)
Total Bilirubin: 0.5 mg/dL (ref 0.20–1.20)
Total Protein: 7 g/dL (ref 6.4–8.3)

## 2017-01-30 MED ORDER — DENOSUMAB 60 MG/ML ~~LOC~~ SOLN
60.0000 mg | Freq: Once | SUBCUTANEOUS | Status: AC
  Administered 2017-01-30: 60 mg via SUBCUTANEOUS
  Filled 2017-01-30: qty 1

## 2017-02-18 ENCOUNTER — Other Ambulatory Visit: Payer: Self-pay | Admitting: Geriatric Medicine

## 2017-02-18 DIAGNOSIS — Z853 Personal history of malignant neoplasm of breast: Secondary | ICD-10-CM

## 2017-02-18 DIAGNOSIS — Z139 Encounter for screening, unspecified: Secondary | ICD-10-CM

## 2017-03-31 ENCOUNTER — Ambulatory Visit
Admission: RE | Admit: 2017-03-31 | Discharge: 2017-03-31 | Disposition: A | Discharge status: Home / Self Care | Payer: Medicare Other | Source: Ambulatory Visit | Attending: Geriatric Medicine | Admitting: Geriatric Medicine

## 2017-03-31 DIAGNOSIS — R928 Other abnormal and inconclusive findings on diagnostic imaging of breast: Secondary | ICD-10-CM | POA: Diagnosis not present

## 2017-03-31 DIAGNOSIS — Z853 Personal history of malignant neoplasm of breast: Secondary | ICD-10-CM

## 2017-04-22 DIAGNOSIS — N184 Chronic kidney disease, stage 4 (severe): Secondary | ICD-10-CM | POA: Diagnosis not present

## 2017-04-22 DIAGNOSIS — I129 Hypertensive chronic kidney disease with stage 1 through stage 4 chronic kidney disease, or unspecified chronic kidney disease: Secondary | ICD-10-CM | POA: Diagnosis not present

## 2017-04-22 DIAGNOSIS — Z0000 Encounter for general adult medical examination without abnormal findings: Secondary | ICD-10-CM | POA: Diagnosis not present

## 2017-04-22 DIAGNOSIS — E1121 Type 2 diabetes mellitus with diabetic nephropathy: Secondary | ICD-10-CM | POA: Diagnosis not present

## 2017-04-28 ENCOUNTER — Ambulatory Visit
Admission: RE | Admit: 2017-04-28 | Discharge: 2017-04-28 | Disposition: A | Discharge status: Home / Self Care | Payer: Medicare Other | Source: Ambulatory Visit | Attending: Nurse Practitioner | Admitting: Nurse Practitioner

## 2017-04-28 ENCOUNTER — Other Ambulatory Visit: Payer: Self-pay | Admitting: Nurse Practitioner

## 2017-04-28 DIAGNOSIS — M25561 Pain in right knee: Secondary | ICD-10-CM

## 2017-04-28 DIAGNOSIS — M160 Bilateral primary osteoarthritis of hip: Secondary | ICD-10-CM | POA: Diagnosis not present

## 2017-04-28 DIAGNOSIS — M25551 Pain in right hip: Secondary | ICD-10-CM

## 2017-04-28 DIAGNOSIS — M179 Osteoarthritis of knee, unspecified: Secondary | ICD-10-CM | POA: Diagnosis not present

## 2017-04-30 DIAGNOSIS — M79661 Pain in right lower leg: Secondary | ICD-10-CM | POA: Diagnosis not present

## 2017-04-30 DIAGNOSIS — M79604 Pain in right leg: Secondary | ICD-10-CM | POA: Diagnosis not present

## 2017-05-02 DIAGNOSIS — I639 Cerebral infarction, unspecified: Secondary | ICD-10-CM

## 2017-05-02 HISTORY — DX: Cerebral infarction, unspecified: I63.9

## 2017-05-15 DIAGNOSIS — M5416 Radiculopathy, lumbar region: Secondary | ICD-10-CM | POA: Insufficient documentation

## 2017-05-15 DIAGNOSIS — M48061 Spinal stenosis, lumbar region without neurogenic claudication: Secondary | ICD-10-CM | POA: Diagnosis not present

## 2017-05-15 DIAGNOSIS — M79604 Pain in right leg: Secondary | ICD-10-CM | POA: Diagnosis not present

## 2017-05-21 DIAGNOSIS — M5417 Radiculopathy, lumbosacral region: Secondary | ICD-10-CM | POA: Diagnosis not present

## 2017-05-26 ENCOUNTER — Observation Stay (HOSPITAL_COMMUNITY)
Admission: EM | Admit: 2017-05-26 | Discharge: 2017-05-28 | Disposition: A | Discharge status: Home / Self Care | Payer: Medicare Other | Attending: Internal Medicine | Admitting: Internal Medicine

## 2017-05-26 ENCOUNTER — Encounter (HOSPITAL_COMMUNITY): Payer: Self-pay

## 2017-05-26 ENCOUNTER — Emergency Department (HOSPITAL_COMMUNITY): Payer: Medicare Other

## 2017-05-26 DIAGNOSIS — Z8249 Family history of ischemic heart disease and other diseases of the circulatory system: Secondary | ICD-10-CM | POA: Insufficient documentation

## 2017-05-26 DIAGNOSIS — M5416 Radiculopathy, lumbar region: Secondary | ICD-10-CM | POA: Diagnosis not present

## 2017-05-26 DIAGNOSIS — N179 Acute kidney failure, unspecified: Secondary | ICD-10-CM | POA: Diagnosis present

## 2017-05-26 DIAGNOSIS — E1122 Type 2 diabetes mellitus with diabetic chronic kidney disease: Secondary | ICD-10-CM

## 2017-05-26 DIAGNOSIS — R531 Weakness: Secondary | ICD-10-CM | POA: Diagnosis not present

## 2017-05-26 DIAGNOSIS — Z79899 Other long term (current) drug therapy: Secondary | ICD-10-CM | POA: Diagnosis not present

## 2017-05-26 DIAGNOSIS — R112 Nausea with vomiting, unspecified: Secondary | ICD-10-CM | POA: Diagnosis present

## 2017-05-26 DIAGNOSIS — R42 Dizziness and giddiness: Secondary | ICD-10-CM | POA: Diagnosis not present

## 2017-05-26 DIAGNOSIS — M25551 Pain in right hip: Secondary | ICD-10-CM | POA: Insufficient documentation

## 2017-05-26 DIAGNOSIS — M109 Gout, unspecified: Secondary | ICD-10-CM | POA: Diagnosis not present

## 2017-05-26 DIAGNOSIS — M8580 Other specified disorders of bone density and structure, unspecified site: Secondary | ICD-10-CM | POA: Insufficient documentation

## 2017-05-26 DIAGNOSIS — N183 Chronic kidney disease, stage 3 (moderate): Secondary | ICD-10-CM | POA: Insufficient documentation

## 2017-05-26 DIAGNOSIS — M1990 Unspecified osteoarthritis, unspecified site: Secondary | ICD-10-CM | POA: Diagnosis not present

## 2017-05-26 DIAGNOSIS — I5032 Chronic diastolic (congestive) heart failure: Secondary | ICD-10-CM | POA: Diagnosis present

## 2017-05-26 DIAGNOSIS — R7989 Other specified abnormal findings of blood chemistry: Secondary | ICD-10-CM | POA: Diagnosis present

## 2017-05-26 DIAGNOSIS — Z853 Personal history of malignant neoplasm of breast: Secondary | ICD-10-CM

## 2017-05-26 DIAGNOSIS — Z7982 Long term (current) use of aspirin: Secondary | ICD-10-CM | POA: Diagnosis not present

## 2017-05-26 DIAGNOSIS — S0003XA Contusion of scalp, initial encounter: Secondary | ICD-10-CM | POA: Insufficient documentation

## 2017-05-26 DIAGNOSIS — N184 Chronic kidney disease, stage 4 (severe): Secondary | ICD-10-CM | POA: Diagnosis present

## 2017-05-26 DIAGNOSIS — I5042 Chronic combined systolic (congestive) and diastolic (congestive) heart failure: Secondary | ICD-10-CM | POA: Insufficient documentation

## 2017-05-26 DIAGNOSIS — R748 Abnormal levels of other serum enzymes: Secondary | ICD-10-CM | POA: Insufficient documentation

## 2017-05-26 DIAGNOSIS — I10 Essential (primary) hypertension: Secondary | ICD-10-CM | POA: Diagnosis present

## 2017-05-26 DIAGNOSIS — G8929 Other chronic pain: Secondary | ICD-10-CM | POA: Diagnosis not present

## 2017-05-26 DIAGNOSIS — T148XXA Other injury of unspecified body region, initial encounter: Secondary | ICD-10-CM | POA: Diagnosis not present

## 2017-05-26 DIAGNOSIS — I130 Hypertensive heart and chronic kidney disease with heart failure and stage 1 through stage 4 chronic kidney disease, or unspecified chronic kidney disease: Secondary | ICD-10-CM | POA: Insufficient documentation

## 2017-05-26 DIAGNOSIS — E7800 Pure hypercholesterolemia, unspecified: Secondary | ICD-10-CM | POA: Diagnosis present

## 2017-05-26 DIAGNOSIS — Z7984 Long term (current) use of oral hypoglycemic drugs: Secondary | ICD-10-CM | POA: Insufficient documentation

## 2017-05-26 DIAGNOSIS — Z9221 Personal history of antineoplastic chemotherapy: Secondary | ICD-10-CM | POA: Diagnosis not present

## 2017-05-26 DIAGNOSIS — E119 Type 2 diabetes mellitus without complications: Secondary | ICD-10-CM

## 2017-05-26 DIAGNOSIS — I350 Nonrheumatic aortic (valve) stenosis: Secondary | ICD-10-CM | POA: Insufficient documentation

## 2017-05-26 DIAGNOSIS — R5381 Other malaise: Secondary | ICD-10-CM

## 2017-05-26 DIAGNOSIS — M25559 Pain in unspecified hip: Secondary | ICD-10-CM | POA: Diagnosis not present

## 2017-05-26 DIAGNOSIS — R778 Other specified abnormalities of plasma proteins: Secondary | ICD-10-CM | POA: Diagnosis present

## 2017-05-26 DIAGNOSIS — L8990 Pressure ulcer of unspecified site, unspecified stage: Secondary | ICD-10-CM

## 2017-05-26 DIAGNOSIS — E1159 Type 2 diabetes mellitus with other circulatory complications: Secondary | ICD-10-CM | POA: Diagnosis present

## 2017-05-26 DIAGNOSIS — W19XXXA Unspecified fall, initial encounter: Secondary | ICD-10-CM | POA: Diagnosis not present

## 2017-05-26 DIAGNOSIS — Y92009 Unspecified place in unspecified non-institutional (private) residence as the place of occurrence of the external cause: Secondary | ICD-10-CM

## 2017-05-26 LAB — CBG MONITORING, ED: Glucose-Capillary: 195 mg/dL — ABNORMAL HIGH (ref 65–99)

## 2017-05-26 NOTE — ED Notes (Signed)
Pt unable to ambulate in the hall at this time, states her right leg is hurting and needed to sit back down.

## 2017-05-26 NOTE — ED Notes (Signed)
Bed: QQ24 Expected date:  Expected time:  Means of arrival:  Comments: EMS 84 yo female weakness/fall

## 2017-05-26 NOTE — ED Triage Notes (Signed)
Pt arrived from home via [facility] due to a fall reports she hit the back of her head. Pt seen in physical therapy due to nerve pain, reports being unsteady on her feet starting today. Pt not on blood thinners, no LOC.

## 2017-05-27 ENCOUNTER — Emergency Department (HOSPITAL_COMMUNITY): Payer: Medicare Other

## 2017-05-27 ENCOUNTER — Encounter (HOSPITAL_COMMUNITY): Payer: Self-pay | Admitting: Family Medicine

## 2017-05-27 ENCOUNTER — Observation Stay (HOSPITAL_COMMUNITY): Payer: Medicare Other

## 2017-05-27 ENCOUNTER — Other Ambulatory Visit: Payer: Self-pay

## 2017-05-27 DIAGNOSIS — R9431 Abnormal electrocardiogram [ECG] [EKG]: Secondary | ICD-10-CM | POA: Diagnosis not present

## 2017-05-27 DIAGNOSIS — S299XXA Unspecified injury of thorax, initial encounter: Secondary | ICD-10-CM | POA: Diagnosis not present

## 2017-05-27 DIAGNOSIS — Z803 Family history of malignant neoplasm of breast: Secondary | ICD-10-CM

## 2017-05-27 DIAGNOSIS — R748 Abnormal levels of other serum enzymes: Secondary | ICD-10-CM | POA: Diagnosis not present

## 2017-05-27 DIAGNOSIS — C50411 Malignant neoplasm of upper-outer quadrant of right female breast: Secondary | ICD-10-CM | POA: Diagnosis present

## 2017-05-27 DIAGNOSIS — Z6833 Body mass index (BMI) 33.0-33.9, adult: Secondary | ICD-10-CM

## 2017-05-27 DIAGNOSIS — E1122 Type 2 diabetes mellitus with diabetic chronic kidney disease: Secondary | ICD-10-CM | POA: Diagnosis present

## 2017-05-27 DIAGNOSIS — Z86711 Personal history of pulmonary embolism: Secondary | ICD-10-CM

## 2017-05-27 DIAGNOSIS — S0003XA Contusion of scalp, initial encounter: Secondary | ICD-10-CM | POA: Diagnosis not present

## 2017-05-27 DIAGNOSIS — E119 Type 2 diabetes mellitus without complications: Secondary | ICD-10-CM

## 2017-05-27 DIAGNOSIS — R42 Dizziness and giddiness: Secondary | ICD-10-CM | POA: Diagnosis not present

## 2017-05-27 DIAGNOSIS — H538 Other visual disturbances: Secondary | ICD-10-CM | POA: Diagnosis not present

## 2017-05-27 DIAGNOSIS — N184 Chronic kidney disease, stage 4 (severe): Secondary | ICD-10-CM | POA: Diagnosis present

## 2017-05-27 DIAGNOSIS — W1809XA Striking against other object with subsequent fall, initial encounter: Secondary | ICD-10-CM | POA: Diagnosis present

## 2017-05-27 DIAGNOSIS — D62 Acute posthemorrhagic anemia: Secondary | ICD-10-CM | POA: Diagnosis present

## 2017-05-27 DIAGNOSIS — I5032 Chronic diastolic (congestive) heart failure: Secondary | ICD-10-CM

## 2017-05-27 DIAGNOSIS — E7800 Pure hypercholesterolemia, unspecified: Secondary | ICD-10-CM | POA: Diagnosis present

## 2017-05-27 DIAGNOSIS — R402142 Coma scale, eyes open, spontaneous, at arrival to emergency department: Secondary | ICD-10-CM | POA: Diagnosis present

## 2017-05-27 DIAGNOSIS — R7989 Other specified abnormal findings of blood chemistry: Secondary | ICD-10-CM

## 2017-05-27 DIAGNOSIS — Z170 Estrogen receptor positive status [ER+]: Secondary | ICD-10-CM

## 2017-05-27 DIAGNOSIS — R112 Nausea with vomiting, unspecified: Secondary | ICD-10-CM

## 2017-05-27 DIAGNOSIS — Y92009 Unspecified place in unspecified non-institutional (private) residence as the place of occurrence of the external cause: Secondary | ICD-10-CM | POA: Diagnosis not present

## 2017-05-27 DIAGNOSIS — I130 Hypertensive heart and chronic kidney disease with heart failure and stage 1 through stage 4 chronic kidney disease, or unspecified chronic kidney disease: Secondary | ICD-10-CM | POA: Diagnosis present

## 2017-05-27 DIAGNOSIS — E1159 Type 2 diabetes mellitus with other circulatory complications: Secondary | ICD-10-CM | POA: Diagnosis present

## 2017-05-27 DIAGNOSIS — R296 Repeated falls: Secondary | ICD-10-CM | POA: Diagnosis present

## 2017-05-27 DIAGNOSIS — Z923 Personal history of irradiation: Secondary | ICD-10-CM

## 2017-05-27 DIAGNOSIS — N179 Acute kidney failure, unspecified: Secondary | ICD-10-CM | POA: Diagnosis present

## 2017-05-27 DIAGNOSIS — S20211A Contusion of right front wall of thorax, initial encounter: Secondary | ICD-10-CM | POA: Diagnosis present

## 2017-05-27 DIAGNOSIS — M109 Gout, unspecified: Secondary | ICD-10-CM | POA: Diagnosis present

## 2017-05-27 DIAGNOSIS — I1310 Hypertensive heart and chronic kidney disease without heart failure, with stage 1 through stage 4 chronic kidney disease, or unspecified chronic kidney disease: Secondary | ICD-10-CM | POA: Diagnosis not present

## 2017-05-27 DIAGNOSIS — N183 Chronic kidney disease, stage 3 (moderate): Secondary | ICD-10-CM | POA: Diagnosis not present

## 2017-05-27 DIAGNOSIS — W19XXXA Unspecified fall, initial encounter: Secondary | ICD-10-CM

## 2017-05-27 DIAGNOSIS — R402252 Coma scale, best verbal response, oriented, at arrival to emergency department: Secondary | ICD-10-CM | POA: Diagnosis present

## 2017-05-27 DIAGNOSIS — I10 Essential (primary) hypertension: Secondary | ICD-10-CM | POA: Diagnosis present

## 2017-05-27 DIAGNOSIS — E669 Obesity, unspecified: Secondary | ICD-10-CM | POA: Diagnosis present

## 2017-05-27 DIAGNOSIS — I2699 Other pulmonary embolism without acute cor pulmonale: Secondary | ICD-10-CM | POA: Diagnosis present

## 2017-05-27 DIAGNOSIS — R778 Other specified abnormalities of plasma proteins: Secondary | ICD-10-CM | POA: Diagnosis present

## 2017-05-27 DIAGNOSIS — E785 Hyperlipidemia, unspecified: Secondary | ICD-10-CM | POA: Diagnosis present

## 2017-05-27 DIAGNOSIS — Z9221 Personal history of antineoplastic chemotherapy: Secondary | ICD-10-CM

## 2017-05-27 DIAGNOSIS — R402362 Coma scale, best motor response, obeys commands, at arrival to emergency department: Secondary | ICD-10-CM | POA: Diagnosis present

## 2017-05-27 DIAGNOSIS — I63433 Cerebral infarction due to embolism of bilateral posterior cerebral arteries: Principal | ICD-10-CM | POA: Diagnosis present

## 2017-05-27 DIAGNOSIS — Z79899 Other long term (current) drug therapy: Secondary | ICD-10-CM

## 2017-05-27 DIAGNOSIS — Z86718 Personal history of other venous thrombosis and embolism: Secondary | ICD-10-CM

## 2017-05-27 DIAGNOSIS — R29702 NIHSS score 2: Secondary | ICD-10-CM | POA: Diagnosis present

## 2017-05-27 DIAGNOSIS — Z853 Personal history of malignant neoplasm of breast: Secondary | ICD-10-CM

## 2017-05-27 DIAGNOSIS — S0990XA Unspecified injury of head, initial encounter: Secondary | ICD-10-CM | POA: Diagnosis not present

## 2017-05-27 DIAGNOSIS — Z7984 Long term (current) use of oral hypoglycemic drugs: Secondary | ICD-10-CM

## 2017-05-27 LAB — URINALYSIS, ROUTINE W REFLEX MICROSCOPIC
Bilirubin Urine: NEGATIVE
Glucose, UA: 50 mg/dL — AB
Ketones, ur: 5 mg/dL — AB
Leukocytes, UA: NEGATIVE
Nitrite: NEGATIVE
Protein, ur: 30 mg/dL — AB
Specific Gravity, Urine: 1.01 (ref 1.005–1.030)
pH: 5 (ref 5.0–8.0)

## 2017-05-27 LAB — TROPONIN I
Troponin I: 0.09 ng/mL (ref <0.03)
Troponin I: 0.09 ng/mL (ref <0.03)
Troponin I: 0.15 ng/mL (ref <0.03)
Troponin I: 0.14 ng/mL (ref <0.03)

## 2017-05-27 LAB — BASIC METABOLIC PANEL
Anion gap: 12 (ref 5–15)
BUN: 62 mg/dL — ABNORMAL HIGH (ref 6–20)
CO2: 26 mmol/L (ref 22–32)
Calcium: 10 mg/dL (ref 8.9–10.3)
Chloride: 104 mmol/L (ref 101–111)
Creatinine, Ser: 1.85 mg/dL — ABNORMAL HIGH (ref 0.44–1.00)
GFR calc Af Amer: 27 mL/min — ABNORMAL LOW (ref >60)
GFR calc non Af Amer: 23 mL/min — ABNORMAL LOW (ref >60)
Glucose, Bld: 195 mg/dL — ABNORMAL HIGH (ref 65–99)
Potassium: 4.2 mmol/L (ref 3.5–5.1)
Sodium: 142 mmol/L (ref 135–145)

## 2017-05-27 LAB — GLUCOSE, CAPILLARY
Glucose-Capillary: 226 mg/dL — ABNORMAL HIGH (ref 65–99)
Glucose-Capillary: 219 mg/dL — ABNORMAL HIGH (ref 65–99)

## 2017-05-27 LAB — CBC WITH DIFFERENTIAL/PLATELET
Basophils Absolute: 0 10*3/uL (ref 0.0–0.1)
Basophils Relative: 0 %
Eosinophils Absolute: 0.1 10*3/uL (ref 0.0–0.7)
Eosinophils Relative: 1 %
HCT: 36.9 % (ref 36.0–46.0)
Hemoglobin: 12.2 g/dL (ref 12.0–15.0)
Lymphocytes Relative: 18 %
Lymphs Abs: 2 10*3/uL (ref 0.7–4.0)
MCH: 32.1 pg (ref 26.0–34.0)
MCHC: 33.1 g/dL (ref 30.0–36.0)
MCV: 97.1 fL (ref 78.0–100.0)
Monocytes Absolute: 0.9 10*3/uL (ref 0.1–1.0)
Monocytes Relative: 8 %
Neutro Abs: 8.1 10*3/uL — ABNORMAL HIGH (ref 1.7–7.7)
Neutrophils Relative %: 73 %
Platelets: 215 10*3/uL (ref 150–400)
RBC: 3.8 MIL/uL — ABNORMAL LOW (ref 3.87–5.11)
RDW: 14.8 % (ref 11.5–15.5)
WBC: 11.1 10*3/uL — ABNORMAL HIGH (ref 4.0–10.5)

## 2017-05-27 LAB — ECHOCARDIOGRAM COMPLETE
Height: 59 in
Weight: 2736 oz

## 2017-05-27 LAB — HEMOGLOBIN A1C
Hgb A1c MFr Bld: 7.4 % — ABNORMAL HIGH (ref 4.8–5.6)
Mean Plasma Glucose: 165.68 mg/dL

## 2017-05-27 MED ORDER — FUROSEMIDE 40 MG PO TABS: 40.0000 mg | ORAL_TABLET | ORAL | Status: DC

## 2017-05-27 MED ORDER — SODIUM CHLORIDE 0.9% FLUSH: 3.0000 mL | Freq: Two times a day (BID) | INTRAVENOUS | Status: DC

## 2017-05-27 MED ORDER — PROMETHAZINE HCL 25 MG/ML IJ SOLN
6.2500 mg | Freq: Four times a day (QID) | INTRAMUSCULAR | Status: DC | PRN
  Administered 2017-05-27: 6.25 mg via INTRAVENOUS
  Filled 2017-05-27: qty 1

## 2017-05-27 MED ORDER — METOPROLOL TARTRATE 25 MG PO TABS
25.0000 mg | ORAL_TABLET | Freq: Two times a day (BID) | ORAL | Status: DC
  Administered 2017-05-27: 25 mg via ORAL
  Filled 2017-05-27: qty 1

## 2017-05-27 MED ORDER — SODIUM CHLORIDE 0.9 % IV SOLN: 250.0000 mL | INTRAVENOUS | Status: DC | PRN

## 2017-05-27 MED ORDER — GABAPENTIN 100 MG PO CAPS
200.0000 mg | ORAL_CAPSULE | Freq: Once | ORAL | Status: AC
  Administered 2017-05-27: 200 mg via ORAL
  Filled 2017-05-27: qty 2

## 2017-05-27 MED ORDER — CLONIDINE HCL 0.2 MG PO TABS: 0.2000 mg | ORAL_TABLET | Freq: Two times a day (BID) | ORAL | Status: DC

## 2017-05-27 MED ORDER — NITROGLYCERIN 0.4 MG SL SUBL: 0.4000 mg | SUBLINGUAL_TABLET | SUBLINGUAL | Status: DC | PRN

## 2017-05-27 MED ORDER — DOCUSATE SODIUM 100 MG PO CAPS: 100.0000 mg | ORAL_CAPSULE | Freq: Two times a day (BID) | ORAL | Status: DC | PRN

## 2017-05-27 MED ORDER — ASPIRIN EC 81 MG PO TBEC
81.0000 mg | DELAYED_RELEASE_TABLET | Freq: Every day | ORAL | Status: DC
  Administered 2017-05-28: 81 mg via ORAL
  Filled 2017-05-27: qty 1

## 2017-05-27 MED ORDER — GABAPENTIN 100 MG PO CAPS: 200.0000 mg | ORAL_CAPSULE | Freq: Three times a day (TID) | ORAL | Status: DC

## 2017-05-27 MED ORDER — ALLOPURINOL 100 MG PO TABS
100.0000 mg | ORAL_TABLET | Freq: Every day | ORAL | Status: DC
  Administered 2017-05-27 – 2017-05-28 (×2): 100 mg via ORAL
  Filled 2017-05-27 (×2): qty 1

## 2017-05-27 MED ORDER — GABAPENTIN 100 MG PO CAPS
200.0000 mg | ORAL_CAPSULE | Freq: Three times a day (TID) | ORAL | Status: DC
  Administered 2017-05-27 – 2017-05-28 (×3): 200 mg via ORAL
  Filled 2017-05-27 (×4): qty 2

## 2017-05-27 MED ORDER — ONDANSETRON HCL 4 MG/2ML IJ SOLN
4.0000 mg | Freq: Once | INTRAMUSCULAR | Status: AC
  Administered 2017-05-27: 4 mg via INTRAVENOUS
  Filled 2017-05-27: qty 2

## 2017-05-27 MED ORDER — SODIUM CHLORIDE 0.9% FLUSH: 3.0000 mL | INTRAVENOUS | Status: DC | PRN

## 2017-05-27 MED ORDER — ATORVASTATIN CALCIUM 10 MG PO TABS
10.0000 mg | ORAL_TABLET | Freq: Every evening | ORAL | Status: DC
  Filled 2017-05-27: qty 1

## 2017-05-27 MED ORDER — ONDANSETRON HCL 4 MG/2ML IJ SOLN
4.0000 mg | Freq: Three times a day (TID) | INTRAMUSCULAR | Status: DC | PRN
  Administered 2017-05-27 (×2): 4 mg via INTRAVENOUS
  Filled 2017-05-27 (×2): qty 2

## 2017-05-27 MED ORDER — FENTANYL CITRATE (PF) 100 MCG/2ML IJ SOLN: 25.0000 ug | INTRAMUSCULAR | Status: DC | PRN

## 2017-05-27 MED ORDER — INSULIN ASPART 100 UNIT/ML ~~LOC~~ SOLN
0.0000 [IU] | Freq: Every day | SUBCUTANEOUS | Status: DC
  Administered 2017-05-27: 2 [IU] via SUBCUTANEOUS

## 2017-05-27 MED ORDER — HEPARIN SODIUM (PORCINE) 5000 UNIT/ML IJ SOLN
5000.0000 [IU] | Freq: Three times a day (TID) | INTRAMUSCULAR | Status: DC
  Administered 2017-05-27 – 2017-05-28 (×2): 5000 [IU] via SUBCUTANEOUS
  Filled 2017-05-27 (×2): qty 1

## 2017-05-27 MED ORDER — HYDRALAZINE HCL 20 MG/ML IJ SOLN
10.0000 mg | INTRAMUSCULAR | Status: DC
  Administered 2017-05-27 (×2): 10 mg via INTRAVENOUS
  Filled 2017-05-27 (×4): qty 1

## 2017-05-27 MED ORDER — CLONIDINE HCL 0.2 MG PO TABS
0.2000 mg | ORAL_TABLET | Freq: Two times a day (BID) | ORAL | Status: DC
  Administered 2017-05-27 – 2017-05-28 (×2): 0.2 mg via ORAL
  Filled 2017-05-27 (×2): qty 1

## 2017-05-27 MED ORDER — HYDRALAZINE HCL 20 MG/ML IJ SOLN
10.0000 mg | INTRAMUSCULAR | Status: DC | PRN
  Administered 2017-05-27: 10 mg via INTRAVENOUS
  Filled 2017-05-27: qty 1

## 2017-05-27 MED ORDER — LOSARTAN POTASSIUM 25 MG PO TABS
25.0000 mg | ORAL_TABLET | Freq: Every day | ORAL | Status: DC
  Administered 2017-05-28: 25 mg via ORAL
  Filled 2017-05-27 (×3): qty 1

## 2017-05-27 MED ORDER — ANASTROZOLE 1 MG PO TABS
1.0000 mg | ORAL_TABLET | Freq: Every day | ORAL | Status: DC
  Administered 2017-05-27 – 2017-05-28 (×2): 1 mg via ORAL
  Filled 2017-05-27 (×2): qty 1

## 2017-05-27 MED ORDER — METOPROLOL TARTRATE 5 MG/5ML IV SOLN
5.0000 mg | Freq: Three times a day (TID) | INTRAVENOUS | Status: DC
  Administered 2017-05-27 – 2017-05-28 (×3): 5 mg via INTRAVENOUS
  Filled 2017-05-27 (×3): qty 5

## 2017-05-27 MED ORDER — INSULIN ASPART 100 UNIT/ML ~~LOC~~ SOLN
0.0000 [IU] | Freq: Three times a day (TID) | SUBCUTANEOUS | Status: DC
  Administered 2017-05-27 – 2017-05-28 (×2): 3 [IU] via SUBCUTANEOUS
  Administered 2017-05-28: 2 [IU] via SUBCUTANEOUS

## 2017-05-27 MED ORDER — HEPARIN SODIUM (PORCINE) 5000 UNIT/ML IJ SOLN: 5000.0000 [IU] | Freq: Three times a day (TID) | INTRAMUSCULAR | Status: DC

## 2017-05-27 MED ORDER — TRAMADOL HCL 50 MG PO TABS: 50.0000 mg | ORAL_TABLET | Freq: Two times a day (BID) | ORAL | Status: DC | PRN

## 2017-05-27 MED ORDER — FUROSEMIDE 40 MG PO TABS
40.0000 mg | ORAL_TABLET | ORAL | Status: DC
  Administered 2017-05-28: 40 mg via ORAL
  Filled 2017-05-27: qty 1

## 2017-05-27 MED ORDER — COLCHICINE 0.6 MG PO TABS
0.6000 mg | ORAL_TABLET | ORAL | Status: DC | PRN
  Filled 2017-05-27: qty 1

## 2017-05-27 MED ORDER — SODIUM CHLORIDE 0.9% FLUSH
3.0000 mL | Freq: Two times a day (BID) | INTRAVENOUS | Status: DC
  Administered 2017-05-27 – 2017-05-28 (×3): 3 mL via INTRAVENOUS

## 2017-05-27 MED ORDER — SODIUM CHLORIDE 0.9 % IV BOLUS
1000.0000 mL | Freq: Once | INTRAVENOUS | Status: AC
  Administered 2017-05-27: 1000 mL via INTRAVENOUS

## 2017-05-27 MED ORDER — VERAPAMIL HCL ER 240 MG PO TBCR
240.0000 mg | EXTENDED_RELEASE_TABLET | Freq: Every day | ORAL | Status: DC
  Administered 2017-05-28: 240 mg via ORAL
  Filled 2017-05-27 (×2): qty 1

## 2017-05-27 MED ORDER — ASPIRIN 81 MG PO CHEW
324.0000 mg | CHEWABLE_TABLET | Freq: Once | ORAL | Status: AC
  Administered 2017-05-27: 324 mg via ORAL
  Filled 2017-05-27: qty 4

## 2017-05-27 NOTE — Progress Notes (Signed)
PT Cancellation Note  Patient Details Name: Sabrina [name] MRN: [id number] DOB: 12-06-1926   Cancelled Treatment:    Reason Eval/Treat Not Completed: Medical issues which prohibited therapy(BP 207/74, troponin .15, will hold PT today. )   [name] 05/27/2017, 4:48 PM [phone]

## 2017-05-27 NOTE — Progress Notes (Signed)
  Echocardiogram 2D Echocardiogram has been performed.  Sabrina [name] 05/27/2017, 4:01 PM

## 2017-05-27 NOTE — Plan of Care (Signed)
Received signout from Dr. Kathrynn Humble. Sabrina [name] is a 84 y/o female with pmh of invasive ductal breast cancer s/p lumpectomy status followed by Dr. Jana Hakim, CKD stage III/IV, HTN, DM type II; presents after having a fall at home with inability to get up.  Patient was found to be unsteady on her feet with complaints of nausea. VS: Temperature 99 F, pulse 81-93, respirations 12-21, blood pressure 175/49-183/86, and O2 saturations 93-97% on room air. Labs: WBC 11.7, BUN 63, creatinine 1.86, glucose 195, troponin 0.9( repeat 0.9).  EKG showed no significant ischemic changes.  CT scan of the brain was negative for any acute abnormalities.  Suspecting possible stroke as cause of difficulty with balance.  MRI ordered.  Case was also discussed with cardiology who recommended checking echocardiogram.  Urinalysis and chest x-ray order placed.  No bed order placed due to pending MRI.  May need consults to cardiology and/or neurology.

## 2017-05-27 NOTE — H&P (Signed)
History and Physical   SABA GOMM DTO:[id number] DOB: 11/27/1926 DOA: 05/26/2017  Referring MD/NP/PA: Kathrynn Humble EDP PCP: [name], MD Outpatient Specialists: Nephrology, [facility]; Oncology, [place]; [facility] Patient coming from: Home  Chief Complaint: Fall at home  HPI: Sabrina [name] is an 84 y.o. female with a history of invasive ductal breast CA s/p lumpectomy, XRT and chemotherapy, stage III-IV CKD, HTN, NIDT2DM who presented to the ED after a fall at home. She stated she was leaning over in the kitchen while preparing food, lost balance and fell to the ground. No LOC, though she bumped her head. She denies headache or other significant pain but was unable to get back up, so called EMS and was brought to the ED due to having a reportedly unsteady gait. She had been having some nausea and emesis that started earlier that afternoon at PT (for sciatica), though denies hematemesis, diarrhea, fever, respiratory or urinary complaints.   On arrival, vital signs were stable, afebrile, WBC mildly elevated at 11.1, creatinine 1.85 which is below baseline, and troponin found to be 0.09, verified on recheck. She denies chest pain, dyspnea. ECG showed sinus rhythm with minimal inferior/lateral ST depressions and nondiagnostic ST elevation in aVR similar to prior tracing. CT head revealed left vertex hematoma without skull fracture and no acute intracranial findings. MRI brain showed atrophy and microvascular changes consistent with age but no other suspicious findings. Hospitalists were consulted for admission.   Review of Systems: Denies fever, chills, weight loss, changes in vision or hearing, headache, cough, sore throat, chest pain, palpitations, shortness of breath, abdominal pain, blood in stool, change in bladder habits, myalgias, arthralgias, and rash, and per HPI. All others reviewed and are negative.   Past Medical History:  Diagnosis Date  . Arthritis    neck  . Breast  cancer ([place]) 03/2013   right  . Chronic kidney disease (CKD), stage III (moderate) (HCC)    nephrologist, Dr. [name]  . Dental crowns present   . Heart murmur    no known problems; states did not know she had murmur until age 43  . High cholesterol   . Hypertension    fluctuates, especially when stressed; has been on med. > 20 yr.  . Immature cataract   . Non-insulin dependent type 2 diabetes mellitus ([place])   . Radiation 09/06/13-10/20/13   Right Breast Cancer  . Wears partial dentures    lower   Past Surgical History:  Procedure Laterality Date  . AXILLARY LYMPH NODE DISSECTION Right 03/30/2013   Procedure: AXILLARY LYMPH NODE DISSECTION;  Surgeon: Rolm Bookbinder, MD;  Location: [place];  Service: General;  Laterality: Right;  . BREAST LUMPECTOMY WITH NEEDLE LOCALIZATION Right 03/30/2013   Procedure: BREAST LUMPECTOMY WITH NEEDLE LOCALIZATION;  Surgeon: Rolm Bookbinder, MD;  Location: [place];  Service: General;  Laterality: Right;  . BREAST SURGERY Right 11/1958   right breast biopsy benign  . DILATION AND CURETTAGE OF UTERUS    . PORTACATH PLACEMENT N/A 04/15/2013   Procedure: INSERTION PORT-A-CATH;  Surgeon: Rolm Bookbinder, MD;  Location: [place];  Service: General;  Laterality: N/A;  . RE-EXCISION OF BREAST CANCER,SUPERIOR MARGINS Right 04/15/2013   Procedure: RE-EXCISION OF RIGHT BREAST  MARGINS;  Surgeon: Rolm Bookbinder, MD;  Location: [place];  Service: General;  Laterality: Right;  . TONSILLECTOMY     as a child   - Non smoker, no EtOH, lives with husband who is elderly and developing dementia. Gets around  with assistance.  reports that she has never smoked. She has never used smokeless tobacco. She reports that she drinks alcohol. She reports that she does not use drugs. No Known Allergies Family History  Problem Relation Age of Onset  . Pneumonia Mother   . Heart attack Father   . Breast cancer Other 74       niece  .  Breast cancer Other 58       niece   - Family history otherwise reviewed and not pertinent.  Prior to Admission medications   Medication Sig Start Date End Date Taking? Authorizing Provider  allopurinol (ZYLOPRIM) 100 MG tablet Take 1 tablet by mouth daily. 01/17/16  Yes [provider]  anastrozole (ARIMIDEX) 1 MG tablet Take 1 tablet (1 mg total) by mouth daily. 07/30/16  Yes Magrinat, Virgie Dad, MD  aspirin EC 81 MG tablet Take 1 tablet (81 mg total) by mouth daily. 12/21/14  Yes Magrinat, Virgie Dad, MD  atorvastatin (LIPITOR) 10 MG tablet Take 10 mg by mouth every evening.    Yes [provider]  BIOTIN 5000 PO Take 1 tablet by mouth daily.   Yes [provider]  cholecalciferol (VITAMIN D) 1000 UNITS tablet Take 1,000 Units by mouth daily.   Yes [provider]  cloNIDine (CATAPRES) 0.2 MG tablet Take 0.2 mg by mouth 2 (two) times daily.   Yes [provider]  docusate sodium (COLACE) 100 MG capsule Take 100 mg by mouth 2 (two) times daily as needed for mild constipation. Reported on 03/21/2015   Yes [provider]  furosemide (LASIX) 40 MG tablet TAKE 1 TABLET IN AM AND 1/2 TABLET IN THE PM. Patient taking differently: TAKE 1 TABLET EVERY OTHER DAY 04/08/16  Yes Bensimhon, Shaune Pascal, MD  gabapentin (NEURONTIN) 100 MG capsule Take 200 mg by mouth 3 (three) times daily.  05/19/17  Yes [provider]  glipiZIDE (GLUCOTROL) 2.5 mg TABS tablet Take 0.5 tablets (2.5 mg total) by mouth 2 (two) times daily before a meal. Patient taking differently: Take by mouth every evening.  09/14/13  Yes Buriev, Arie Sabina, MD  IRON PO Take 65 mg by mouth daily.   Yes [provider]  losartan (COZAAR) 25 MG tablet Take 25 mg by mouth daily.  11/02/13  Yes [provider]  traMADol (ULTRAM) 50 MG tablet Take 1 tablet (50 mg total) by mouth 2 (two) times daily. Patient taking differently: Take 50 mg by mouth daily.  06/19/13  Yes Theodis Blaze, MD  verapamil (VERELAN PM) 240 MG 24 hr capsule Take 240 mg by mouth daily.    Yes [provider]  vitamin C (ASCORBIC ACID) 500 MG tablet Take 500 mg by mouth daily.   Yes [provider]  colchicine 0.6 MG tablet Take 0.6 mg by mouth as needed (GOUT).     [provider]  glucose blood test strip Use as instructed 09/14/13   Kinnie Feil, MD  glucose monitoring kit (FREESTYLE) monitoring kit 1 each by Does not apply route as needed for other. 09/14/13   Kinnie Feil, MD    Physical Exam: Vitals:   05/27/17 0530 05/27/17 0715 05/27/17 0800 05/27/17 0930  BP: (!) 223/83 (!) 180/59 (!) 177/98 (!) 189/75  Pulse: (!) 106 96 93 96  Resp: 19 16 16 16   Temp:      TempSrc:      SpO2: 93% 94% 98% 95%  Weight:  Height:       Constitutional: Pleasant, elderly female in no distress, calm demeanor Eyes: Lids and conjunctivae normal, PERRL ENMT: Mucous membranes are moist. Posterior pharynx clear of any exudate or lesions. Fair dentition.  Neck: normal, supple, no masses, no thyromegaly Respiratory: Non-labored breathing without accessory muscle use. Clear breath sounds to auscultation bilaterally Cardiovascular: Regular rate and rhythm, soft SEM at base > carotids, no rubs, or gallops. No carotid bruits. No JVD. Trace LE edema. 2+ pedal pulses. Abdomen: Normoactive bowel sounds. No significant tenderness, non-distended, and no masses palpated. No hepatosplenomegaly. GU: No indwelling catheter Musculoskeletal: No clubbing / cyanosis. No joint deformity upper and lower extremities. Good ROM, no contractures. Normal muscle tone.  Skin: Warm, dry. Very mild tenderness to left vertex of the head without visible ecchymosis or palpable fluctuance. No rashes, wounds, or ulcers. Neurologic: CN II-XII grossly intact. Gait not assessed. Speech normal. Mild weakness in lower extremities but no focal deficits in motor strength or sensation Psychiatric: Alert and  oriented x3. Normal judgment and insight. Mood wnl with broad affect.   Labs on Admission: I have personally reviewed following labs and imaging studies  CBC: Recent Labs  Lab 05/26/17 2332  WBC 11.1*  NEUTROABS 8.1*  HGB 12.2  HCT 36.9  MCV 97.1  PLT 937   Basic Metabolic Panel: Recent Labs  Lab 05/26/17 2332  NA 142  K 4.2  CL 104  CO2 26  GLUCOSE 195*  BUN 62*  CREATININE 1.85*  CALCIUM 10.0   GFR: Estimated Creatinine Clearance: 18.9 mL/min (A) (by C-G formula based on SCr of 1.85 mg/dL (H)). Liver Function Tests: No results for input(s): AST, ALT, ALKPHOS, BILITOT, PROT, ALBUMIN in the last 168 hours. No results for input(s): LIPASE, AMYLASE in the last 168 hours. No results for input(s): AMMONIA in the last 168 hours. Coagulation Profile: No results for input(s): INR, PROTIME in the last 168 hours. Cardiac Enzymes: Recent Labs  Lab 05/26/17 2332 05/27/17 0331  TROPONINI 0.09* 0.09*   BNP (last 3 results) No results for input(s): PROBNP in the last 8760 hours. HbA1C: No results for input(s): HGBA1C in the last 72 hours. CBG: Recent Labs  Lab 05/26/17 2329  GLUCAP 195*   Lipid Profile: No results for input(s): CHOL, HDL, LDLCALC, TRIG, CHOLHDL, LDLDIRECT in the last 72 hours. Thyroid Function Tests: No results for input(s): TSH, T4TOTAL, FREET4, T3FREE, THYROIDAB in the last 72 hours. Anemia Panel: No results for input(s): VITAMINB12, FOLATE, FERRITIN, TIBC, IRON, RETICCTPCT in the last 72 hours. Urine analysis:    Component Value Date/Time   COLORURINE YELLOW 05/27/2017 0826   APPEARANCEUR CLEAR 05/27/2017 0826   LABSPEC 1.010 05/27/2017 0826   PHURINE 5.0 05/27/2017 0826   GLUCOSEU 50 (A) 05/27/2017 0826   HGBUR SMALL (A) 05/27/2017 0826   BILIRUBINUR NEGATIVE 05/27/2017 0826   KETONESUR 5 (A) 05/27/2017 0826   PROTEINUR 30 (A) 05/27/2017 0826   UROBILINOGEN 0.2 09/13/2013 0523   NITRITE NEGATIVE 05/27/2017 0826   LEUKOCYTESUR NEGATIVE  05/27/2017 0826    No results found for this or any previous visit (from the past 240 hour(s)).   Radiological Exams on Admission: Ct Head Wo Contrast  Result Date: 05/27/2017 CLINICAL DATA:  Fall with head impact EXAM: CT HEAD WITHOUT CONTRAST TECHNIQUE: Contiguous axial images were obtained from the base of the skull through the vertex without intravenous contrast. COMPARISON:  None. FINDINGS: Brain: No mass lesion, intraparenchymal hemorrhage or extra-axial collection. No evidence of acute cortical infarct. There  is periventricular hypoattenuation compatible with chronic microvascular disease. Vascular: No hyperdense vessel or unexpected vascular calcification. Skull: Left vertex scalp hematoma.  No skull fracture. Sinuses/Orbits: No sinus fluid levels or advanced mucosal thickening. No mastoid effusion. Normal orbits. IMPRESSION: Small left vertex scalp hematoma without skull fracture or acute intracranial abnormality. Electronically Signed   By: Ulyses Jarred M.D.   On: 05/27/2017 00:44   Mr Brain Wo Contrast  Result Date: 05/27/2017 CLINICAL DATA:  Loss of balance and falling last night. Gait disturbance and nausea with dizziness. EXAM: MRI HEAD WITHOUT CONTRAST TECHNIQUE: Multiplanar, multiecho pulse sequences of the brain and surrounding structures were obtained without intravenous contrast. COMPARISON:  Head CT same day. FINDINGS: Brain: Diffusion imaging does not show any acute or subacute infarction. The brainstem and cerebellum are normal. Cerebral hemispheres show mild age related volume loss and mild chronic appearing small vessel change of the white matter, fairly typical for age. No cortical or large vessel territory infarction. No mass lesion, hemorrhage, hydrocephalus or extra-axial collection. Vascular: Major vessels at the base of the brain show flow. Skull and upper cervical spine: Negative Sinuses/Orbits: Clear/normal Other: Left parietal vertex scalp swelling. IMPRESSION: No acute  intracranial finding. Age related atrophy and mild chronic small-vessel change of the white matter, fairly typical for age. Electronically Signed   By: Nelson Chimes M.D.   On: 05/27/2017 07:03   Dg Chest Port 1 View  Result Date: 05/27/2017 CLINICAL DATA:  84 year old female with fall.  Elevated troponin. EXAM: PORTABLE CHEST 1 VIEW COMPARISON:  Chest radiograph dated 06/01/2014 FINDINGS: The lungs are clear. There is no pleural effusion or pneumothorax. The cardiac silhouette is within normal limits. No acute osseous pathology. Right axillary surgical clips. IMPRESSION: No active disease. Electronically Signed   By: Anner Crete M.D.   On: 05/27/2017 06:46    EKG: Independently reviewed. Sinus rhythm, vent rate 100bpm with nondiagnostic ST depressions in diffuse leads, mostly inferior/lateral and mild ST elevation < 42m in aVR. Changes are seen in previous tracing.   Assessment/Plan Principal Problem:   Fall at home Active Problems:   Breast cancer of upper-outer quadrant of right female breast (HCC)   Nausea and vomiting   Chronic diastolic congestive heart failure (HCC)   Chronic kidney disease (CKD), stage III (moderate) (HCC)   High cholesterol   Non-insulin dependent type 2 diabetes mellitus ([place]   Hypertension   Gout   Elevated troponin   Troponin elevation: With nonacute ECG changes. - Trend troponin - ASA, prn oxygen, NTG, fentanyl (no morphine with CKD) - Dr. HTerrence Dupontconsulted, will evaluate patient.  - Echocardiogram ordered  Fall at home: Has had falls in the past. No syncope, low suspicion for seizures. Neurologic imaging negative.  - PT/OT - CM consulted - Fall precautions, up with assistance  Nausea and vomiting: Without abdominal pain.  - Await urinalysis results. Has mild leukocytosis of uncertain significance but no fever.  - Keep NPO for now, advance as tolerated  Stage III CKD: Followed by Dr. GMoshe Cipro Creatinine actually at low end of her baseline.   - Avoid nephrotoxins and contrast/angiography if possible. - Continue home medications  Chronic HFpEF, HTN: Elevated BP in ED in setting of not taking medications overnight.  - Continue clonidine,  ARB, verapamil - Received 1L NS, will restart home QOD lasix and not continue fluids for now. - Add hydralazine IV prn  - Echo as above  NIDT2DM:  - Update A1c  - Hold [facility], give SSI - Continue  ASA, statin  Scalp hematoma: Mild. Monitor.   History of breast CA: Followed by Dr. Jana Hakim.  - Continue home anastrozole  Gout: No acute flare - Continue maintenance allopurinol  DVT prophylaxis: Heparin  Code Status: Full  Family Communication: None at bedside Disposition Plan: Uncertain Consults called: Cardiology, Har[facility]  Admission status: Observation    Vance Gather, MD [facility] Pager [phone]  If 7PM-7AM, please contact night-coverage [email or web address] Password [facility] 05/27/2017, 10:16 AM

## 2017-05-27 NOTE — Consult Note (Signed)
Reason for Consult:Minimally elevated cardiac enzymes Referring Physician: [facility] hospitalist  Sabrina [name] is an 84 y.o. female.  HPI: patient is 84 year old female with past medical history significant for hypertension, type 2 diabetes mellitus, chronic kidney disease stage III, history of ductal breast carcinoma status post lumpectomy/radiation and chemotherapy in the past, degenerative joint disease, hyperlipidemia, history of passive tobacco use, had a fall due to unsteady gait. Patient denies any chest pain , palpitations or syncopal episode. Called EMS and came to the hospital Cardiologic consultation was called as patient was noted to have minimally elevated troponin I and EKG done in the ED showed minor nonspecific ST-T wave changes in inferolateral leads. patient states she went to rehabilitation for her chronic hip pain yesterday and felt very weak and nauseous after the therapy Patient also gives history of exertional dyspnea but denies any exertional chest pain although activity is very limited. Denies any recent cardiac workup. States for last 34 weeks because of her right hip chronic pain her activity is very limited now.  Past Medical History:  Diagnosis Date  . Arthritis    neck  . Breast cancer ([place]) 03/2013   right  . Chronic kidney disease (CKD), stage III (moderate) (HCC)    nephrologist, Dr. [name]  . Dental crowns present   . Heart murmur    no known problems; states did not know she had murmur until age 25  . High cholesterol   . Hypertension    fluctuates, especially when stressed; has been on med. > 20 yr.  . Immature cataract   . Non-insulin dependent type 2 diabetes mellitus ([place])   . Radiation 09/06/13-10/20/13   Right Breast Cancer  . Wears partial dentures    lower    Past Surgical History:  Procedure Laterality Date  . AXILLARY LYMPH NODE DISSECTION Right 03/30/2013   Procedure: AXILLARY LYMPH NODE DISSECTION;  Surgeon: Rolm Bookbinder, MD;   Location: [facility];  Service: General;  Laterality: Right;  . BREAST LUMPECTOMY WITH NEEDLE LOCALIZATION Right 03/30/2013   Procedure: BREAST LUMPECTOMY WITH NEEDLE LOCALIZATION;  Surgeon: Rolm Bookbinder, MD;  Location: [facility];  Service: General;  Laterality: Right;  . BREAST SURGERY Right 11/1958   right breast biopsy benign  . DILATION AND CURETTAGE OF UTERUS    . PORTACATH PLACEMENT N/A 04/15/2013   Procedure: INSERTION PORT-A-CATH;  Surgeon: Rolm Bookbinder, MD;  Location: [facility];  Service: General;  Laterality: N/A;  . RE-EXCISION OF BREAST CANCER,SUPERIOR MARGINS Right 04/15/2013   Procedure: RE-EXCISION OF RIGHT BREAST  MARGINS;  Surgeon: Rolm Bookbinder, MD;  Location: [facility];  Service: General;  Laterality: Right;  . TONSILLECTOMY     as a child    Family History  Problem Relation Age of Onset  . Pneumonia Mother   . Heart attack Father   . Breast cancer Other 74       niece  . Breast cancer Other 68       niece    Social History:  reports that she has never smoked. She has never used smokeless tobacco. She reports that she drinks alcohol. She reports that she does not use drugs.  Allergies: No Known Allergies  Medications: I have reviewed the patient's current medications.  Results for orders placed or performed during the hospital encounter of 05/26/17 (from the past 48 hour(s))  POC CBG, ED     Status: Abnormal   Collection Time: 05/26/17 11:29 PM  Result Value Ref Range  Glucose-Capillary 195 (H) 65 - 99 mg/dL  CBC with Differential     Status: Abnormal   Collection Time: 05/26/17 11:32 PM  Result Value Ref Range   WBC 11.1 (H) 4.0 - 10.5 K/uL   RBC 3.80 (L) 3.87 - 5.11 MIL/uL   Hemoglobin 12.2 12.0 - 15.0 g/dL   HCT 36.9 36.0 - 46.0 %   MCV 97.1 78.0 - 100.0 fL   MCH 32.1 26.0 - 34.0 pg   MCHC 33.1 30.0 - 36.0 g/dL   RDW 14.8 11.5 - 15.5 %   Platelets 215 150 - 400 K/uL   Neutrophils Relative % 73 %   Neutro Abs 8.1  (H) 1.7 - 7.7 K/uL   Lymphocytes Relative 18 %   Lymphs Abs 2.0 0.7 - 4.0 K/uL   Monocytes Relative 8 %   Monocytes Absolute 0.9 0.1 - 1.0 K/uL   Eosinophils Relative 1 %   Eosinophils Absolute 0.1 0.0 - 0.7 K/uL   Basophils Relative 0 %   Basophils Absolute 0.0 0.0 - 0.1 K/uL    Comment: Performed at [facility], [place] [address] [place], [place] [address]  Basic metabolic panel     Status: Abnormal   Collection Time: 05/26/17 11:32 PM  Result Value Ref Range   Sodium 142 135 - 145 mmol/L   Potassium 4.2 3.5 - 5.1 mmol/L   Chloride 104 101 - 111 mmol/L   CO2 26 22 - 32 mmol/L   Glucose, Bld 195 (H) 65 - 99 mg/dL   BUN 62 (H) 6 - 20 mg/dL   Creatinine, Ser 1.85 (H) 0.44 - 1.00 mg/dL   Calcium 10.0 8.9 - 10.3 mg/dL   GFR calc non Af Amer 23 (L) >60 mL/min   GFR calc Af Amer 27 (L) >60 mL/min    Comment: (NOTE) The eGFR has been calculated using the CKD EPI equation. This calculation has not been validated in all clinical situations. eGFR's persistently <60 mL/min signify possible Chronic Kidney Disease.    Anion gap 12 5 - 15    Comment: Performed at [facility], [place] [address], [place], [place] [address]  Troponin I     Status: Abnormal   Collection Time: 05/26/17 11:32 PM  Result Value Ref Range   Troponin I 0.09 (HH) <0.03 ng/mL    Comment: CRITICAL RESULT CALLED TO, READ BACK BY AND VERIFIED WITH: DR.NANOVATI 3.26.19 _0  ZANDO,C Performed at [facility], [place] [address] [place], [place] [address]   Troponin I     Status: Abnormal   Collection Time: 05/27/17  3:31 AM  Result Value Ref Range   Troponin I 0.09 (HH) <0.03 ng/mL    Comment: CRITICAL VALUE NOTED.  VALUE IS CONSISTENT WITH PREVIOUSLY REPORTED AND CALLED VALUE. Performed at [facility], [place] [address] [place],  [address]   Urinalysis, Routine w reflex microscopic     Status: Abnormal   Collection Time: 05/27/17  8:26 AM   Result Value Ref Range   Color, Urine YELLOW YELLOW   APPearance CLEAR CLEAR   Specific Gravity, Urine 1.010 1.005 - 1.030   pH 5.0 5.0 - 8.0   Glucose, UA 50 (A) NEGATIVE mg/dL   Hgb urine dipstick SMALL (A) NEGATIVE   Bilirubin Urine NEGATIVE NEGATIVE   Ketones, ur 5 (A) NEGATIVE mg/dL   Protein, ur 30 (A) NEGATIVE mg/dL   Nitrite NEGATIVE NEGATIVE   Leukocytes, UA NEGATIVE NEGATIVE   RBC / HPF 0-5  0 - 5 RBC/hpf   WBC, UA 0-5 0 - 5 WBC/hpf   Bacteria, UA RARE (A) NONE SEEN   Squamous Epithelial / LPF 0-5 (A) NONE SEEN   Mucus PRESENT     Comment: Performed at [facility], [place] [address] [place], [place] [address]    Ct Head Wo Contrast  Result Date: 05/27/2017 CLINICAL DATA:  Fall with head impact EXAM: CT HEAD WITHOUT CONTRAST TECHNIQUE: Contiguous axial images were obtained from the base of the skull through the vertex without intravenous contrast. COMPARISON:  None. FINDINGS: Brain: No mass lesion, intraparenchymal hemorrhage or extra-axial collection. No evidence of acute cortical infarct. There is periventricular hypoattenuation compatible with chronic microvascular disease. Vascular: No hyperdense vessel or unexpected vascular calcification. Skull: Left vertex scalp hematoma.  No skull fracture. Sinuses/Orbits: No sinus fluid levels or advanced mucosal thickening. No mastoid effusion. Normal orbits. IMPRESSION: Small left vertex scalp hematoma without skull fracture or acute intracranial abnormality. Electronically Signed   By: Ulyses Jarred M.D.   On: 05/27/2017 00:44   Mr Brain Wo Contrast  Result Date: 05/27/2017 CLINICAL DATA:  Loss of balance and falling last night. Gait disturbance and nausea with dizziness. EXAM: MRI HEAD WITHOUT CONTRAST TECHNIQUE: Multiplanar, multiecho pulse sequences of the brain and surrounding structures were obtained without intravenous contrast. COMPARISON:  Head CT same day. FINDINGS: Brain: Diffusion imaging does not show any  acute or subacute infarction. The brainstem and cerebellum are normal. Cerebral hemispheres show mild age related volume loss and mild chronic appearing small vessel change of the white matter, fairly typical for age. No cortical or large vessel territory infarction. No mass lesion, hemorrhage, hydrocephalus or extra-axial collection. Vascular: Major vessels at the base of the brain show flow. Skull and upper cervical spine: Negative Sinuses/Orbits: Clear/normal Other: Left parietal vertex scalp swelling. IMPRESSION: No acute intracranial finding. Age related atrophy and mild chronic small-vessel change of the white matter, fairly typical for age. Electronically Signed   By: Nelson Chimes M.D.   On: 05/27/2017 07:03   Dg Chest Port 1 View  Result Date: 05/27/2017 CLINICAL DATA:  84 year old female with fall.  Elevated troponin. EXAM: PORTABLE CHEST 1 VIEW COMPARISON:  Chest radiograph dated 06/01/2014 FINDINGS: The lungs are clear. There is no pleural effusion or pneumothorax. The cardiac silhouette is within normal limits. No acute osseous pathology. Right axillary surgical clips. IMPRESSION: No active disease. Electronically Signed   By: Anner Crete M.D.   On: 05/27/2017 06:46    Review of Systems  Constitutional: Positive for malaise/fatigue. Negative for diaphoresis and fever.  HENT: Negative for hearing loss.   Eyes: Negative for blurred vision.  Respiratory: Negative for cough and sputum production.   Cardiovascular: Negative for chest pain.  Gastrointestinal: Positive for nausea. Negative for abdominal pain.  Genitourinary: Negative for dysuria.  Neurological: Negative for dizziness.   Blood pressure (!) 185/69, pulse (!) 102, temperature 99 F (37.2 C), temperature source Oral, resp. rate 16, height 4' 11" (1.499 m), weight 77.6 kg (171 lb), SpO2 96 %. Physical Exam  Constitutional: She is oriented to person, place, and time.  HENT:  Head: Normocephalic and atraumatic.  Eyes:  Pupils are equal, round, and reactive to light. Conjunctivae are normal. Left eye exhibits no discharge. No scleral icterus.  Neck: Normal range of motion. Neck supple. No JVD present. No tracheal deviation present. No thyromegaly present.  Cardiovascular: Normal rate and regular rhythm.  Murmur (2/6 systolic murmur noted) heard. Respiratory: Effort normal and  breath sounds normal. No respiratory distress. She has no rales.  GI: Soft. Bowel sounds are normal. She exhibits distension. There is no tenderness. There is no rebound.  Musculoskeletal:  No clubbing cyanosis 1+ edema right leg more than the left  Neurological: She is alert and oriented to person, place, and time.    Assessment/Plan: Minimally elevated troponin I secondary to demand ischemia/renal insufficiency doubt significant MI History of exertional dyspnea with minor EKG changes worrisome for CAD Status post fall Hypertension Diabetes mellitus Chronic kidney disease stage III History of congestive heart failure secondary to preserved LV systolic function in past Hyperlipidemia Degenerative joint disease History of breast carcinoma Plan Check serial enzymes and EKG Start low-dose Lopressor as per orders Continue aspirin nitroglycerin Check 2-D echo Agree with present medical management Discussed with patient regarding various options of treatment agrees for medical management unless there is significant bump in her cardiac enzymes  Charolette Forward 05/27/2017, 10:46 AM

## 2017-05-27 NOTE — ED Notes (Signed)
Spoke with Dr. Bonner Puna in relation to patient's BP and emesis episode after antihypertensive medication. New orders entered, see MAR.

## 2017-05-27 NOTE — Progress Notes (Signed)
Patient arrived to the floor from the ED and BP 207/74. Patient has IV BP meds ordered that's scheduled and prn. Patient has had two episodes of emesis within 30 minutes of arriving to the floor.  Patient is unable to take any PO meds at this time. MD made aware of PO meds and nausea/vomiting. Will continue to monitor.

## 2017-05-27 NOTE — Care Management Note (Signed)
Case Management Note  Patient Details  Name: COLLIE KITTEL MRN: [id number] Date of Birth: 1926/10/09  Subjective/Objective: CM referral for HHC-Await PT cons recc.                   Action/Plan:d/c home w/HHC.   Expected Discharge Date:  (unknown)               Expected Discharge Plan:  [place]  In-House Referral:     Discharge planning Services  CM Consult  Post Acute Care Choice:    Choice offered to:     DME Arranged:    DME Agency:     HH Arranged:    HH Agency:     Status of Service:  In process, will continue to follow  If discussed at Long Length of Stay Meetings, dates discussed:    Additional Comments:  Dessa Phi, RN 05/27/2017, 4:30 PM

## 2017-05-27 NOTE — ED Provider Notes (Addendum)
[facility] DEPT Provider Note   CSN: [id number] Arrival date & time: 05/26/17  2156     History   Chief Complaint Chief Complaint  Patient presents with  . Fall    HPI Sabrina [name] is a 84 y.o. female.  HPI  84 year old female comes in from her home after a fall.  Patient has history of chronic kidney disease, hyperlipidemia, diabetes.  She has no history of strokes or CAD.  Patient states that she was leaning forward to take care of something, and must have lost balance and fell down.  Patient denies any syncope or loss of consciousness after the fall.  Patient was unable to get up after falling down and had to call EMS.  Upon EMS his evaluation patient appeared unsteady with her gait so they recommended that patient come to the ER.  Patient states that she has been having nausea since this afternoon.  She also agrees that she was slightly unsteady than usual today.  Patient denies any new headache, neck pain, numbness, tingling, focal weakness, vision changes.  Past Medical History:  Diagnosis Date  . Arthritis    neck  . Breast cancer ([place]) 03/2013   right  . Chronic kidney disease (CKD), stage III (moderate) (HCC)    nephrologist, Dr. [name]  . Dental crowns present   . Heart murmur    no known problems; states did not know she had murmur until age 67  . High cholesterol   . Hypertension    fluctuates, especially when stressed; has been on med. > 20 yr.  . Immature cataract   . Non-insulin dependent type 2 diabetes mellitus ([place])   . Radiation 09/06/13-10/20/13   Right Breast Cancer  . Wears partial dentures    lower    Patient Active Problem List   Diagnosis Date Noted  . Lipoma of lower extremity 09/12/2014  . Abnormal x-ray 03/15/2014  . Chronic diastolic congestive heart failure ([place]) 02/23/2014  . Osteopenia 11/30/2013  . Hypoglycemia 09/13/2013  . Acute respiratory failure with hypoxia ([place]) 06/14/2013  .  Pulmonary embolism ([place]) 06/10/2013  . Nausea and vomiting 06/10/2013  . Accelerated hypertension 06/10/2013  . Aortic stenosis 04/22/2013  . Edema leg 04/22/2013  . Breast cancer of upper-outer quadrant of right female breast ([place]) 03/19/2013    Past Surgical History:  Procedure Laterality Date  . AXILLARY LYMPH NODE DISSECTION Right 03/30/2013   Procedure: AXILLARY LYMPH NODE DISSECTION;  Surgeon: Rolm Bookbinder, MD;  Location: [facility];  Service: General;  Laterality: Right;  . BREAST LUMPECTOMY WITH NEEDLE LOCALIZATION Right 03/30/2013   Procedure: BREAST LUMPECTOMY WITH NEEDLE LOCALIZATION;  Surgeon: Rolm Bookbinder, MD;  Location: [facility];  Service: General;  Laterality: Right;  . BREAST SURGERY Right 11/1958   right breast biopsy benign  . DILATION AND CURETTAGE OF UTERUS    . PORTACATH PLACEMENT N/A 04/15/2013   Procedure: INSERTION PORT-A-CATH;  Surgeon: Rolm Bookbinder, MD;  Location: [facility];  Service: General;  Laterality: N/A;  . RE-EXCISION OF BREAST CANCER,SUPERIOR MARGINS Right 04/15/2013   Procedure: RE-EXCISION OF RIGHT BREAST  MARGINS;  Surgeon: Rolm Bookbinder, MD;  Location: [facility];  Service: General;  Laterality: Right;  . TONSILLECTOMY     as a child     OB History   None    Obstetric Comments  Menarche ae 14 No children Married 56 years         Home Medications  Prior to Admission medications   Medication Sig Start Date End Date Taking? Authorizing Provider  allopurinol (ZYLOPRIM) 100 MG tablet Take 1 tablet by mouth daily. 01/17/16  Yes [provider]  anastrozole (ARIMIDEX) 1 MG tablet Take 1 tablet (1 mg total) by mouth daily. 07/30/16  Yes Magrinat, Virgie Dad, MD  aspirin EC 81 MG tablet Take 1 tablet (81 mg total) by mouth daily. 12/21/14  Yes Magrinat, Virgie Dad, MD  atorvastatin (LIPITOR) 10 MG tablet Take 10 mg by mouth every evening.    Yes [provider]  BIOTIN 5000 PO Take 1 tablet  by mouth daily.   Yes [provider]  cholecalciferol (VITAMIN D) 1000 UNITS tablet Take 1,000 Units by mouth daily.   Yes [provider]  cloNIDine (CATAPRES) 0.2 MG tablet Take 0.2 mg by mouth 2 (two) times daily.   Yes [provider]  docusate sodium (COLACE) 100 MG capsule Take 100 mg by mouth 2 (two) times daily as needed for mild constipation. Reported on 03/21/2015   Yes [provider]  furosemide (LASIX) 40 MG tablet TAKE 1 TABLET IN AM AND 1/2 TABLET IN THE PM. Patient taking differently: TAKE 1 TABLET EVERY OTHER DAY 04/08/16  Yes Bensimhon, Shaune Pascal, MD  gabapentin (NEURONTIN) 100 MG capsule Take 200 mg by mouth 3 (three) times daily.  05/19/17  Yes [provider]  glipiZIDE (GLUCOTROL) 2.5 mg TABS tablet Take 0.5 tablets (2.5 mg total) by mouth 2 (two) times daily before a meal. Patient taking differently: Take by mouth every evening.  09/14/13  Yes Buriev, Arie Sabina, MD  IRON PO Take 65 mg by mouth daily.   Yes [provider]  losartan (COZAAR) 25 MG tablet Take 25 mg by mouth daily.  11/02/13  Yes [provider]  traMADol (ULTRAM) 50 MG tablet Take 1 tablet (50 mg total) by mouth 2 (two) times daily. Patient taking differently: Take 50 mg by mouth daily.  06/19/13  Yes Theodis Blaze, MD  verapamil (VERELAN PM) 240 MG 24 hr capsule Take 240 mg by mouth daily.    Yes [provider]  vitamin C (ASCORBIC ACID) 500 MG tablet Take 500 mg by mouth daily.   Yes [provider]  colchicine 0.6 MG tablet Take 0.6 mg by mouth as needed (GOUT).     [provider]  glucose blood test strip Use as instructed 09/14/13   Kinnie Feil, MD  glucose monitoring kit (FREESTYLE) monitoring kit 1 each by Does not apply route as needed for other. 09/14/13   Kinnie Feil, MD    Family History Family History  Problem Relation Age of Onset  . Pneumonia Mother   . Heart attack Father   . Breast cancer Other  89       niece  . Breast cancer Other 41       niece    Social History Social History   Tobacco Use  . Smoking status: Never Smoker  . Smokeless tobacco: Never Used  . Tobacco comment: only smoked 2 packs cigarettes total  Substance Use Topics  . Alcohol use: Yes    Alcohol/week: 0.0 oz    Comment: 2-3 glasses wine/week  . Drug use: No     Allergies   Patient has no known allergies.   Review of Systems Review of Systems  Constitutional: Positive for activity change.  Respiratory: Negative for shortness of breath.   Cardiovascular: Negative for chest pain.  Gastrointestinal: Positive for nausea. Negative for vomiting.  Allergic/Immunologic: Negative for immunocompromised state.  Neurological: Positive for dizziness and light-headedness. Negative for tremors, seizures, syncope, speech difficulty, weakness, numbness and headaches.  Hematological: Does not bruise/bleed easily.     Physical Exam Updated Vital Signs BP (!) 181/63   Pulse 81   Temp 99 F (37.2 C) (Oral)   Resp 12   Ht 4' 11"  (1.499 m)   Wt 77.6 kg (171 lb)   SpO2 97%   BMI 34.54 kg/m   Physical Exam  Constitutional: She is oriented to person, place, and time. She appears well-developed.  HENT:  Head: Normocephalic and atraumatic.  Eyes: Pupils are equal, round, and reactive to light. EOM are normal.  No nystagmus  Neck: Normal range of motion. Neck supple.  Cardiovascular: Normal rate.  Pulmonary/Chest: Effort normal.  Abdominal: Bowel sounds are normal.  Neurological: She is alert and oriented to person, place, and time. No cranial nerve deficit. Coordination normal.  Cerebellar exam is normal (finger to nose) Sensory exam normal for bilateral upper and lower extremities - and patient is able to discriminate between sharp and dull. Motor exam is 4+/5   Skin: Skin is warm and dry.  Nursing note and vitals reviewed.    ED Treatments / Results  Labs (all labs ordered are listed, but only  abnormal results are displayed) Labs Reviewed  CBC WITH DIFFERENTIAL/PLATELET - Abnormal; Notable for the following components:      Result Value   WBC 11.1 (*)    RBC 3.80 (*)    Neutro Abs 8.1 (*)    All other components within normal limits  BASIC METABOLIC PANEL - Abnormal; Notable for the following components:   Glucose, Bld 195 (*)    BUN 62 (*)    Creatinine, Ser 1.85 (*)    GFR calc non Af Amer 23 (*)    GFR calc Af Amer 27 (*)    All other components within normal limits  TROPONIN I - Abnormal; Notable for the following components:   Troponin I 0.09 (*)    All other components within normal limits  TROPONIN I - Abnormal; Notable for the following components:   Troponin I 0.09 (*)    All other components within normal limits  CBG MONITORING, ED - Abnormal; Notable for the following components:   Glucose-Capillary 195 (*)    All other components within normal limits  URINALYSIS, ROUTINE W REFLEX MICROSCOPIC    EKG EKG Interpretation  Date/Time:  Tuesday May 27 2017 04:35:22 EDT Ventricular Rate:  100 PR Interval:    QRS Duration: 106 QT Interval:  353 QTC Calculation: 456 R Axis:   25 Text Interpretation:  Sinus tachycardia Minimal ST depression, inferior leads and lateral leads - not new avR has ST elevation - not new No significant change since last tracing Confirmed by Varney Biles [id number]) on 05/27/2017 4:41:51 AM   Radiology Ct Head Wo Contrast  Result Date: 05/27/2017 CLINICAL DATA:  Fall with head impact EXAM: CT HEAD WITHOUT CONTRAST TECHNIQUE: Contiguous axial images were obtained from the base of the skull through the vertex without intravenous contrast. COMPARISON:  None. FINDINGS: Brain: No mass lesion, intraparenchymal hemorrhage or extra-axial collection. No evidence of acute cortical infarct. There is periventricular hypoattenuation compatible with chronic microvascular disease. Vascular: No hyperdense vessel or unexpected vascular calcification.  Skull: Left vertex scalp hematoma.  No skull fracture. Sinuses/Orbits: No sinus fluid levels or advanced mucosal thickening. No mastoid effusion. Normal orbits. IMPRESSION:  Small left vertex scalp hematoma without skull fracture or acute intracranial abnormality. Electronically Signed   By: Ulyses Jarred M.D.   On: 05/27/2017 00:44    Procedures Procedures (including critical care time)  Medications Ordered in ED Medications  aspirin chewable tablet 324 mg (324 mg Oral Given 05/27/17 0208)  sodium chloride 0.9 % bolus 1,000 mL (1,000 mLs Intravenous Given 05/27/17 0209)  gabapentin (NEURONTIN) capsule 200 mg (200 mg Oral Given 05/27/17 0327)  ondansetron (ZOFRAN) injection 4 mg (4 mg Intravenous Given 05/27/17 0526)     Initial Impression / Assessment and Plan / ED Course  I have reviewed the triage vital signs and the nursing notes.  Pertinent labs & imaging results that were available during my care of the patient were reviewed by me and considered in my medical decision making (see chart for details).  Clinical Course as of May 28 550  Tue May 27, 2017  0230 Troponin took significant time to result.  I am unsure as to the accuracy of the troponin.  Patient reassessed, she continues to deny chest pain/shortness of breath.  EKG has been ordered.  Repeat troponin will be completed.  Troponin I(!!): 0.09 [AN]  0550 Repeat troponin is still 0.09.  EKG does not have any acute changes. My suspicion is that the troponin is from noncardiac etiology.  I spoke with Dr. Loralie Champagne, who recommends that patient get an echocardiogram.  He would appreciate if hospitalist re-consults cardiology service if needed, and to look for other cause for the troponin elevation.  Troponin I(!!): 0.09 [AN]  0551 My suspicion is still high for posterior stroke as the cause for the mild troponin bump.  Patient continues to have episodes of nausea and she has felt dizzy as well when she gets up.  MRI of the brain has  been ordered.  If the MRI is positive then patient can be admitted to [facility], otherwise patient will need admission to [facility] for echocardiogram and further workup.  I spoke with Dr. Tamala Julian, hospitalist and made him aware of this plan and he agrees to take over.   [AN]    Clinical Course User Index [AN] Varney Biles, MD    84 year old female comes in with chief complaint of fall and unsteady gait.  On my exam patient is noted to have hematoma to her scalp, otherwise she has a nonfocal and normal neurologic exam.  Differential diagnosis includes orthostatic hypotension, orthostatic dizziness, stroke, brain bleed.  CT scan of the head ordered.  Patient is not on any blood thinners.  Review of system is also positive for weakness and nausea which could be atypical presentation of ACS in a diabetic elderly female, therefore troponin has been ordered.  Final Clinical Impressions(s) / ED Diagnoses   Final diagnoses:  Elevated troponin  Intractable vomiting with nausea, unspecified vomiting type  Dizziness    ED Discharge Orders    None       Varney Biles, MD 05/27/17 0981    Varney Biles, MD 05/27/17 [phone]

## 2017-05-28 DIAGNOSIS — Y92009 Unspecified place in unspecified non-institutional (private) residence as the place of occurrence of the external cause: Secondary | ICD-10-CM | POA: Diagnosis not present

## 2017-05-28 DIAGNOSIS — W19XXXA Unspecified fall, initial encounter: Secondary | ICD-10-CM | POA: Diagnosis not present

## 2017-05-28 DIAGNOSIS — R748 Abnormal levels of other serum enzymes: Secondary | ICD-10-CM | POA: Diagnosis not present

## 2017-05-28 DIAGNOSIS — R112 Nausea with vomiting, unspecified: Secondary | ICD-10-CM | POA: Diagnosis not present

## 2017-05-28 DIAGNOSIS — I1310 Hypertensive heart and chronic kidney disease without heart failure, with stage 1 through stage 4 chronic kidney disease, or unspecified chronic kidney disease: Secondary | ICD-10-CM | POA: Diagnosis not present

## 2017-05-28 DIAGNOSIS — R9431 Abnormal electrocardiogram [ECG] [EKG]: Secondary | ICD-10-CM | POA: Diagnosis not present

## 2017-05-28 DIAGNOSIS — L8990 Pressure ulcer of unspecified site, unspecified stage: Secondary | ICD-10-CM

## 2017-05-28 LAB — BASIC METABOLIC PANEL
Anion gap: 13 (ref 5–15)
BUN: 42 mg/dL — ABNORMAL HIGH (ref 6–20)
CO2: 25 mmol/L (ref 22–32)
Calcium: 9.5 mg/dL (ref 8.9–10.3)
Chloride: 106 mmol/L (ref 101–111)
Creatinine, Ser: 1.68 mg/dL — ABNORMAL HIGH (ref 0.44–1.00)
GFR calc Af Amer: 30 mL/min — ABNORMAL LOW (ref >60)
GFR calc non Af Amer: 26 mL/min — ABNORMAL LOW (ref >60)
Glucose, Bld: 231 mg/dL — ABNORMAL HIGH (ref 65–99)
Potassium: 4 mmol/L (ref 3.5–5.1)
Sodium: 144 mmol/L (ref 135–145)

## 2017-05-28 LAB — GLUCOSE, CAPILLARY
Glucose-Capillary: 180 mg/dL — ABNORMAL HIGH (ref 65–99)
Glucose-Capillary: 207 mg/dL — ABNORMAL HIGH (ref 65–99)
Glucose-Capillary: 95 mg/dL (ref 65–99)

## 2017-05-28 LAB — CBC
HCT: 39.6 % (ref 36.0–46.0)
Hemoglobin: 12.7 g/dL (ref 12.0–15.0)
MCH: 31.8 pg (ref 26.0–34.0)
MCHC: 32.1 g/dL (ref 30.0–36.0)
MCV: 99.2 fL (ref 78.0–100.0)
Platelets: 267 10*3/uL (ref 150–400)
RBC: 3.99 MIL/uL (ref 3.87–5.11)
RDW: 14.9 % (ref 11.5–15.5)
WBC: 15.3 10*3/uL — ABNORMAL HIGH (ref 4.0–10.5)

## 2017-05-28 LAB — TROPONIN I: Troponin I: 0.15 ng/mL (ref <0.03)

## 2017-05-28 NOTE — Clinical Social Work Note (Signed)
Clinical Social Work Assessment  Patient Details  Name: VLADA URIOSTEGUI MRN: [id number] Date of Birth: 04-15-1926  Date of referral:  05/28/17               Reason for consult:  Facility Placement                Permission sought to share information with:  Case Manager, Chartered certified accountant granted to share information::  Yes, Verbal Permission Granted  Name::        Agency::     Relationship::     Contact Information:     Housing/Transportation Living arrangements for the past 2 months:  Single Family Home Source of Information:  Patient, Siblings Patient Interpreter Needed:  None Criminal Activity/Legal Involvement Pertinent to Current Situation/Hospitalization:  No - Comment as needed Significant Relationships:  Spouse, Siblings Lives with:  Spouse Do you feel safe going back to the place where you live?  (PT recommending SNF) Need for family participation in patient care:  No (Coment)  Care giving concerns:  Patient from home with husband. Patient is primary caregiver for her husband. Patient reported that prior to hospitalization that she was independent with ADLs and that she had an aide that came 5 days /week to assist with cleaning, running errands and making sure that patient and her husband have breakfast. Patient reported that she had multiple falls prior to admission. PT recommending SNF.   Social Worker assessment / plan:  CSW spoke with patient at bedside regarding discharge planning and PT recommendation for SNF. CSW explained SNF placement process and insurance authorization, patient verbalized understanding. Patient reported that she would prefer to go to [facility] SNF if she has to go to a SNF for ST rehab. CSW explained that patient may not obtain insurance authorization prior to discharge and inquired about patient's ability to private pay for SNF until authorization is received. Patient reported that she may be able to private pay for SNF for a  couple weeks. CSW agreed to follow up with [facility] SNF.  CSW contacted [facility] SNF to see if they had a bed available for patient and if they would accept private pay for patient while waiting on insurance authorization. CSW was not able to get in touch with admissions staff at [facility].   CSW updated patient, patient reported that she may not be ready to dc from hospital. CSW agreed to have patient's RNCM follow up with patient about discharge options.  CSW updated patient's RNCM. Patient's RNCM agreed to follow up with patient about discharge options.   CSW and RNCM spoke with patient at bedside about discharge options. Patient reported that she will discharge home with home health services and try to obtain additional services from current private duty agency.  CSW signing off, no other needs identified at this time.   Employment status:  Retired Nurse, adult PT Recommendations:  [place] / Referral to community resources:  [place], Other (Comment Required)(referral to case management for home health services)  Patient/Family's Response to care:  Patient agreeable to home with home health services and seeking additional care from current private duty care agency.  Patient/Family's Understanding of and Emotional Response to Diagnosis, Current Treatment, and Prognosis:  Patient presented calm and became tearful when talking about her husband's care needs.Patient verbalized understanding of her diagnosis, current treatment and PT recommendation for SNF. Patient reported that her husband has early dementia and cannot stay home alone.  Patient verbalized plan to discharge home with home health services and obtain additional services from her current private duty care agency.   Emotional Assessment Appearance:  Appears stated age Attitude/Demeanor/Rapport:  (Open) Affect (typically observed):   Tearful/Crying Orientation:  Oriented to Self, Oriented to Situation, Oriented to Place, Oriented to  Time Alcohol / Substance use:  Not Applicable Psych involvement (Current and /or in the community):  No (Comment)  Discharge Needs  Concerns to be addressed:  Care Coordination Readmission within the last 30 days:  No Current discharge risk:  Physical Impairment Barriers to Discharge:  No Barriers Identified   Burnis Medin, LCSW 05/28/2017, 4:01 PM

## 2017-05-28 NOTE — Evaluation (Addendum)
Physical Therapy Evaluation Patient Details Name: Sabrina [name] MRN: [id number] DOB: November 29, 1926 Today's Date: 05/28/2017   History of Present Illness  84 yo female admitted after falling at home. Hx of Dm, breast cancer, PE, DVT, R hip pain (chronic), CKD  Clinical Impression  On eval, pt was Min guard assist for mobility. She walked ~60 feet with a RW. Some pain in R hip with activity but she was able to participate fairly well. Pt presents with general weakness, decreased activity tolerance, and impaired gait and balance. Discussed d/c plan-pt plans to return home. She is not agreeable to ST rehab at SNF (at time of eval) due to fear of leaving husband alone at home. Pt reports her husband has cognitive deficits. Pt is agreeable to HHPT f/u. Will follow and progress activity as tolerated.     Follow Up Recommendations SNF (if pt will agree. If she refuses, then HHPT and [place] aide if possible)    Equipment Recommendations  None recommended by PT    Recommendations for Other Services       Precautions / Restrictions Precautions Precautions: Fall Restrictions Weight Bearing Restrictions: No      Mobility  Bed Mobility Overal bed mobility: Needs Assistance Bed Mobility: Supine to Sit     Supine to sit: HOB elevated;Supervision     General bed mobility comments: increased time, safety.   Transfers Overall transfer level: Needs assistance Equipment used: Rolling walker (2 wheeled) Transfers: Sit to/from [facility] Sit to Stand: Min guard Stand pivot transfers: Min guard       General transfer comment: close guard for safety. VCS safety, hand placement. Stand pivot, bed to bsc, with RW.   Ambulation/Gait Ambulation/Gait assistance: Min guard Ambulation Distance (Feet): 60 Feet Assistive device: Rolling walker (2 wheeled) Gait Pattern/deviations: Step-through pattern;Decreased stride length;Trunk flexed     General Gait Details: close guard for safety.  slow gait speed. No LOB with RW use. Limited distance to avoid aggravating R hip.   Stairs            Wheelchair Mobility    Modified Rankin (Stroke Patients Only)       Balance Overall balance assessment: Needs assistance;History of Falls         Standing balance support: Bilateral upper extremity supported Standing balance-Leahy Scale: Poor Standing balance comment: requires RW                             Pertinent Vitals/Pain Pain Assessment: Faces Faces Pain Scale: Hurts little more Pain Location: R hip Pain Descriptors / Indicators: Discomfort;Sore Pain Intervention(s): Limited activity within patient's tolerance    Home Living Family/patient expects to be discharged to:: Private residence Living Arrangements: Spouse/significant other Available Help at Discharge: Family Type of Home: House Home Access: Stairs to enter Entrance Stairs-Rails: None Entrance Stairs-Number of Steps: 2-back Home Layout: One level Home Equipment: Cane - single point;Walker - 2 wheels      Prior Function Level of Independence: Independent with assistive device(s)         Comments: using walker last few weeks due to R LE pain. at baseline, uses cane     Hand Dominance        Extremity/Trunk Assessment   Upper Extremity Assessment Upper Extremity Assessment: Defer to OT evaluation    Lower Extremity Assessment Lower Extremity Assessment: Generalized weakness    Cervical / Trunk Assessment Cervical / Trunk Assessment: Kyphotic  Communication  Communication: No difficulties  Cognition Arousal/Alertness: Awake/alert Behavior During Therapy: WFL for tasks assessed/performed Overall Cognitive Status: Within Functional Limits for tasks assessed                                 General Comments: pt reported she was confused this a.m       General Comments      Exercises     Assessment/Plan    PT Assessment Patient needs continued  PT services  PT Problem List Decreased strength;Decreased balance;Decreased mobility;Decreased activity tolerance;Pain;Decreased knowledge of use of DME       PT Treatment Interventions DME instruction;Functional mobility training;Therapeutic activities;Gait training;Balance training;Patient/family education;Therapeutic exercise    PT Goals (Current goals can be found in the Care Plan section)  Acute Rehab PT Goals Patient Stated Goal: home soon PT Goal Formulation: With patient Time For Goal Achievement: 06/11/17 Potential to Achieve Goals: Good    Frequency Min 3X/week   Barriers to discharge        Co-evaluation               AM-PAC PT "6 Clicks" Daily Activity  Outcome Measure Difficulty turning over in bed (including adjusting bedclothes, sheets and blankets)?: A Little Difficulty moving from lying on back to sitting on the side of the bed? : A Little Difficulty sitting down on and standing up from a chair with arms (e.g., wheelchair, bedside commode, etc,.)?: A Little Help needed moving to and from a bed to chair (including a wheelchair)?: A Little Help needed walking in hospital room?: A Little Help needed climbing 3-5 steps with a railing? : A Little 6 Click Score: 18    End of Session Equipment Utilized During Treatment: Gait belt Activity Tolerance: Patient tolerated treatment well Patient left: in chair;with call bell/phone within reach;with chair alarm set   PT Visit Diagnosis: Muscle weakness (generalized) (M62.81);Difficulty in walking, not elsewhere classified (R26.2)    Time: 3546-5681 PT Time Calculation (min) (ACUTE ONLY): 46 min   Charges:   PT Evaluation $PT Eval Moderate Complexity: 1 Mod PT Treatments $Gait Training: 8-22 mins $Therapeutic Activity: 8-22 mins   PT G Codes:          Weston Anna, MPT Pager: [phone]

## 2017-05-28 NOTE — Care Management Note (Signed)
Case Management Note  Patient Details  Name: Sabrina [name] MRN: [id number] Date of Birth: 03/18/1926  Subjective/Objective:  Patient chose [facility] for HHC-rep Sabrina [name] aware of d/c. HHRN/PT/OT/aide/sw. Patient states her sister Sabrina [name] has a key to home. Informed patient that 1st choice [place] service is a custodial level home care service-not covered by insurance-they have communicated w/a liason Sabrina [name] about private duty care.No further Cm needs.                  Action/Plan:d/c home w/HHC.   Expected Discharge Date:  (unknown)               Expected Discharge Plan:  [place]  In-House Referral:  Clinical Social Work  Discharge planning Services  CM Consult  Post Acute Care Choice:  (1st choice HHC-custodial level private duty) Choice offered to:  Patient  DME Arranged:    DME Agency:     HH Arranged:  RN, PT, OT, Nurse's Aide, Social Work [employer] Agency:  [facility]  Status of Service:  Completed, signed off  If discussed at [employer], dates discussed:    Additional Comments:  Dessa Phi, RN 05/28/2017, 3:44 PM

## 2017-05-28 NOTE — Care Management Note (Signed)
Case Management Note  Patient Details  Name: Sabrina [name] MRN: [id number] Date of Birth: 11-06-1926  Subjective/Objective:84 y/o f admitted w/Fall. From home. Received call from [facility] Nevel(RN) [phone]-she informed me that patient's spouse fell @ home, & they provide custodial level service for patient, & spouse. They are sending spouse to [facility] ED.  She wanted to get more info on patient. She informed me that patient & spouse has no other caregivers-they rely on her for all of their needs. I explained to this rep that while patient is here @ hospital we will eval for the best services for both patient, & spouse with our entire team. Await PT cons-recc.                   Action/Plan:d/c home.   Expected Discharge Date:  (unknown)               Expected Discharge Plan:  [place]  In-House Referral:     Discharge planning Services  CM Consult  Post Acute Care Choice:  ([facility]) Choice offered to:     DME Arranged:    DME Agency:     HH Arranged:    [place] Agency:     Status of Service:  In process, will continue to follow  If discussed at Long Length of Stay Meetings, dates discussed:    Additional Comments:  Dessa Phi, RN 05/28/2017, 11:39 AM

## 2017-05-28 NOTE — Progress Notes (Signed)
CSW met with pt and pt's sister and confirmed they were aware they were D/C'ing and that the pt's husband is in the ED (room [phone]) and also ready for D/C.  CSW confirmed with pt that pt's sister's son is riding around the city waiting to p/u the pt, the pt's sister and the pt's husband.  CSW facilitated with pt's RN and the pt's husband's RN that all were going to meet at the ED waiting room once the pt's sister's wife arrives to p/u the whole family.  Pt's RN stated she would contact the pt's husband's RN in the ED and make him aware she is taking the pt (room [phone]) and the pt's sister to the ED waiting room entrance so the RN in the ED [phone]) could meet them in the ED Waiting Room to be p/u by family.  Please reconsult if future social work needs arise.  CSW signing off, as social work intervention is no longer needed.   Alphonse Guild. Deontrey Massi, LCSW, LCAS, CSI Clinical Social Worker Ph: [phone]

## 2017-05-28 NOTE — Evaluation (Signed)
Occupational Therapy Evaluation Patient Details Name: Sabrina [name] MRN: [id number] DOB: June 13, 1926 Today's Date: 05/28/2017    History of Present Illness 84 yo female admitted after falling at home. Hx of Dm, breast cancer, PE, DVT, R hip pain (chronic), CKD   Clinical Impression   Pt was admitted for the above.  She has been using a RW recently due to R hip pain and is mostly mod I for adls except help from husband with socks/shoes.  She reports that he has memory difficulties.  She just found out that he was admitted to the ED. She will benefit from continued OT to restore PLOF. Will also educate her on AE to increase independence with adls.  Goals are for supervision level in acute setting. She is min guard for ambulating mod A for LB dressing.    Follow Up Recommendations  SNF    Equipment Recommendations  None recommended by OT    Recommendations for Other Services       Precautions / Restrictions Precautions Precautions: Fall Restrictions Weight Bearing Restrictions: No      Mobility Bed Mobility Overal bed mobility: Needs Assistance Bed Mobility: Supine to Sit     Supine to sit: HOB elevated;Supervision     General bed mobility comments: oob  Transfers Overall transfer level: Needs assistance Equipment used: Rolling walker (2 wheeled) Transfers: Sit to/from [facility] Sit to Stand: Min guard        General transfer comment: for safety    Balance Overall balance assessment: Needs assistance;History of Falls         Standing balance support: Bilateral upper extremity supported Standing balance-Leahy Scale: Poor Standing balance comment: requires RW                           ADL either performed or assessed with clinical judgement   ADL Overall ADL's : Needs assistance/impaired Eating/Feeding: Independent   Grooming: Wash/dry hands;Min guard;Standing   Upper Body Bathing: Set up;Sitting   Lower Body Bathing: Minimal  assistance;Sit to/from stand   Upper Body Dressing : Set up;Sitting   Lower Body Dressing: Moderate assistance;Sit to/from stand   Toilet Transfer: Min guard;Ambulation;BSC;RW   Toileting- Water quality scientist and Hygiene: Min guard;Sit to/from stand         General ADL Comments: ambulated to bathroom. Pt's husband puts her shoes on for her.  She just found out that he is in the ED     Vision         Perception     Praxis      Pertinent Vitals/Pain Pain Assessment: Faces Faces Pain Scale: Hurts little more Pain Location: R hip Pain Descriptors / Indicators: Discomfort;Sore Pain Intervention(s): Limited activity within patient's tolerance;Monitored during session;Repositioned     Hand Dominance     Extremity/Trunk Assessment Upper Extremity Assessment Upper Extremity Assessment: Overall WFL for tasks assessed   Lower Extremity Assessment Lower Extremity Assessment: Generalized weakness   Cervical / Trunk Assessment Cervical / Trunk Assessment: Kyphotic   Communication Communication Communication: No difficulties   Cognition Arousal/Alertness: Awake/alert Behavior During Therapy: WFL for tasks assessed/performed Overall Cognitive Status: Within Functional Limits for tasks assessed                                    General Comments       Exercises     Shoulder Instructions  Home Living Family/patient expects to be discharged to:: Private residence Living Arrangements: Spouse/significant other Available Help at Discharge: Family Type of Home: House Home Access: Stairs to enter [employer] of Steps: 2-back Entrance Stairs-Rails: None Home Layout: One level     Bathroom Shower/Tub: Occupational psychologist: Standard     Home Equipment: Cane - single point;Walker - 2 wheels          Prior Functioning/Environment Level of Independence: Independent with assistive device(s)        Comments: using  walker last few weeks due to R LE pain. at baseline, uses cane        OT Problem List: Decreased strength;Decreased activity tolerance;Impaired balance (sitting and/or standing);Pain;Decreased knowledge of use of DME or AE      OT Treatment/Interventions: Self-care/ADL training;DME and/or AE instruction;Patient/family education;Balance training;Therapeutic activities    OT Goals(Current goals can be found in the care plan section) Acute Rehab OT Goals Patient Stated Goal: home soon OT Goal Formulation: With patient Time For Goal Achievement: 06/11/17 Potential to Achieve Goals: Good ADL Goals Pt Will Transfer to Toilet: with supervision;regular height toilet;ambulating Additional ADL Goal #1: pt will gather clothes and perform ADL at supervision level, using AE as needed  OT Frequency: Min 2X/week   Barriers to D/C:            Co-evaluation              AM-PAC PT "6 Clicks" Daily Activity     Outcome Measure Help from another person eating meals?: None Help from another person taking care of personal grooming?: A Little Help from another person toileting, which includes using toliet, bedpan, or urinal?: A Little Help from another person bathing (including washing, rinsing, drying)?: A Little Help from another person to put on and taking off regular upper body clothing?: A Little Help from another person to put on and taking off regular lower body clothing?: A Lot 6 Click Score: 18   End of Session    Activity Tolerance: Patient tolerated treatment well Patient left: in chair;with call bell/phone within reach;with chair alarm set  OT Visit Diagnosis: Muscle weakness (generalized) (M62.81);Pain Pain - Right/Left: Right Pain - part of body: Hip                Time: 1238-1300 OT Time Calculation (min): 22 min Charges:  OT General Charges $OT Visit: 1 Visit OT Evaluation $OT Eval Low Complexity: 1 Low G-Codes:     [place],  OTR/L [id number] 05/28/2017  Conleigh Heinlein 05/28/2017, 1:24 PM

## 2017-05-28 NOTE — Progress Notes (Signed)
Subjective:  Patient denies any chest pain or shortness of breath.  Complains of right hip pain.  Patient worried as  She came to know her husband is in the ER and had seizures  Objective:  Vital Signs in the last 24 hours: Temp:  [98 F (36.7 C)-99.3 F (37.4 C)] 98 F (36.7 C) (03/27 0536) Pulse Rate:  [82-115] 82 (03/27 1032) Resp:  [17-20] 18 (03/27 0536) BP: (132-223)/(55-84) 157/57 (03/27 1032) SpO2:  [94 %-100 %] 95 % (03/27 0536) Weight:  [74.8 kg (164 lb 14.5 oz)] 74.8 kg (164 lb 14.5 oz) (03/26 1641)  Intake/Output from previous day: 03/26 0701 - 03/27 0700 In: -  Out: 400 [Emesis/NG output:400] Intake/Output from this shift: No intake/output data recorded.  Physical Exam: Neck: no adenopathy, no carotid bruit, no JVD and supple, symmetrical, trachea midline Lungs: clear to auscultation bilaterally Heart: regular rate and rhythm, S1, S2 normal and 2/6 systolic murmur noted Abdomen: soft, non-tender; bowel sounds normal; no masses,  no organomegaly Extremities: no clubbing, cyanosis, trace edema right leg  Lab Results: Recent Labs    05/26/17 2332 05/28/17 0001  WBC 11.1* 15.3*  HGB 12.2 12.7  PLT 215 267   Recent Labs    05/26/17 2332 05/28/17 0001  NA 142 144  K 4.2 4.0  CL 104 106  CO2 26 25  GLUCOSE 195* 231*  BUN 62* 42*  CREATININE 1.85* 1.68*   Recent Labs    05/27/17 1809 05/28/17 0001  TROPONINI 0.14* 0.15*   Hepatic Function Panel No results for input(s): PROT, ALBUMIN, AST, ALT, ALKPHOS, BILITOT, BILIDIR, IBILI in the last 72 hours. No results for input(s): CHOL in the last 72 hours. No results for input(s): PROTIME in the last 72 hours.  Imaging: Imaging results have been reviewed and Ct Head Wo Contrast  Result Date: 05/27/2017 CLINICAL DATA:  Fall with head impact EXAM: CT HEAD WITHOUT CONTRAST TECHNIQUE: Contiguous axial images were obtained from the base of the skull through the vertex without intravenous contrast. COMPARISON:   None. FINDINGS: Brain: No mass lesion, intraparenchymal hemorrhage or extra-axial collection. No evidence of acute cortical infarct. There is periventricular hypoattenuation compatible with chronic microvascular disease. Vascular: No hyperdense vessel or unexpected vascular calcification. Skull: Left vertex scalp hematoma.  No skull fracture. Sinuses/Orbits: No sinus fluid levels or advanced mucosal thickening. No mastoid effusion. Normal orbits. IMPRESSION: Small left vertex scalp hematoma without skull fracture or acute intracranial abnormality. Electronically Signed   By: Ulyses Jarred M.D.   On: 05/27/2017 00:44   Mr Brain Wo Contrast  Result Date: 05/27/2017 CLINICAL DATA:  Loss of balance and falling last night. Gait disturbance and nausea with dizziness. EXAM: MRI HEAD WITHOUT CONTRAST TECHNIQUE: Multiplanar, multiecho pulse sequences of the brain and surrounding structures were obtained without intravenous contrast. COMPARISON:  Head CT same day. FINDINGS: Brain: Diffusion imaging does not show any acute or subacute infarction. The brainstem and cerebellum are normal. Cerebral hemispheres show mild age related volume loss and mild chronic appearing small vessel change of the white matter, fairly typical for age. No cortical or large vessel territory infarction. No mass lesion, hemorrhage, hydrocephalus or extra-axial collection. Vascular: Major vessels at the base of the brain show flow. Skull and upper cervical spine: Negative Sinuses/Orbits: Clear/normal Other: Left parietal vertex scalp swelling. IMPRESSION: No acute intracranial finding. Age related atrophy and mild chronic small-vessel change of the white matter, fairly typical for age. Electronically Signed   By: Jan Fireman.D.  On: 05/27/2017 07:03   Dg Chest Port 1 View  Result Date: 05/27/2017 CLINICAL DATA:  84 year old female with fall.  Elevated troponin. EXAM: PORTABLE CHEST 1 VIEW COMPARISON:  Chest radiograph dated 06/01/2014  FINDINGS: The lungs are clear. There is no pleural effusion or pneumothorax. The cardiac silhouette is within normal limits. No acute osseous pathology. Right axillary surgical clips. IMPRESSION: No active disease. Electronically Signed   By: Anner Crete M.D.   On: 05/27/2017 06:46    Cardiac Studies:  Assessment/Plan:  Minimally elevated troponin I secondary to demand ischemia/renal insufficiency doubt significant MI History of exertional dyspnea with minor EKG changes worrisome for CAD Status post fall Hypertension Diabetes mellitus Chronic kidney disease stage III History of congestive heart failure secondary to preserved LV systolic function in past Hyperlipidemia Degenerative joint disease History of breast carcinoma Plan Continue present management. Increase ambulation as tolerated. Okay to discharge from cardiac point of view   LOS: 0 days    Charolette Forward 05/28/2017, 1:09 PM

## 2017-05-28 NOTE — Care Management Note (Signed)
Case Management Note  Patient Details  Name: ASUZENA WEIS MRN: [id number] Date of Birth: April 11, 1926  Subjective/Objective: PT recc SNF. Patient agrees to SNF.CSW notified.                   Action/Plan:d/c SNF.   Expected Discharge Date:  (unknown)               Expected Discharge Plan:  [place]  In-House Referral:  Clinical Social Work  Discharge planning Services  CM Consult  Post Acute Care Choice:  ([facility] choice [facility] level private duty) Choice offered to:     DME Arranged:    DME Agency:     HH Arranged:    [place] Agency:     Status of Service:  In process, will continue to follow  If discussed at Long Length of Stay Meetings, dates discussed:    Additional Comments:  Dessa Phi, RN 05/28/2017, 2:31 PM

## 2017-05-28 NOTE — Care Management Note (Signed)
Case Management Note  Patient Details  Name: Sabrina [name] MRN: [id number] Date of Birth: 1926-12-13  Subjective/Objective:84 y/o f admitted w/fall. From home. Spouse has fallen & is @ [facility] currently. Patient wants her sister Garald Balding to be the primary contact since her husband is here @ [facility] h#[phone], [phone].                    Action/Plan:d/c home.   Expected Discharge Date:  (unknown)               Expected Discharge Plan:  [place]  In-House Referral:     Discharge planning Services  CM Consult  Post Acute Care Choice:  ([facility] choice [facility] privat duty) Choice offered to:     DME Arranged:    DME Agency:     HH Arranged:    [place] Agency:     Status of Service:  In process, will continue to follow  If discussed at Long Length of Stay Meetings, dates discussed:    Additional Comments:  Dessa Phi, RN 05/28/2017, 2:18 PM

## 2017-05-28 NOTE — Care Management Obs Status (Signed)
[place] NOTIFICATION   Patient Details  Name: Sabrina [name] MRN: [id number] Date of Birth: 12/01/1926   Medicare Observation Status Notification Given:  Yes    Dessa Phi, RN 05/28/2017, 2:18 PM

## 2017-05-28 NOTE — Discharge Summary (Addendum)
Physician Discharge Summary  Sabrina [name] JIR:[id number] DOB: 1926-03-21 DOA: 05/26/2017  PCP: Sabrina Manes, MD  Admit date: 05/26/2017 Discharge date: 05/28/2017  Admitted From: Home Disposition: Home, declined SNF   Recommendations for Outpatient Follow-up:  1. Follow up with PCP in 1 week 2. Please obtain BMP/CBC in 1 week   Home Health: PT OT RN Aide SW  Equipment/Devices: None    Discharge Condition: Stable, improved CODE STATUS: Full  Diet recommendation: Heart healthy   Brief/Interim Summary: HPI per Dr. Bonner Puna: [name] is an 84 y.o. female with a history of invasive ductal breast CA s/p lumpectomy, XRT and chemotherapy, stage III-IV CKD, HTN, NIDT2DM who presented to the ED after a fall at home. She stated she was leaning over in the kitchen while preparing food, lost balance and fell to the ground. No LOC, though she bumped her head. She denies headache or other significant pain but was unable to get back up, so called EMS and was brought to the ED due to having a reportedly unsteady gait. She had been having some nausea and emesis that started earlier that afternoon at PT (for sciatica), though denies hematemesis, diarrhea, fever, respiratory or urinary complaints.   On arrival to the ED, vital signs were stable, afebrile, WBC mildly elevated at 11.1, creatinine 1.85 which is below baseline, and troponin found to be 0.09, verified on recheck. She denies chest pain, dyspnea. ECG showed sinus rhythm with minimal inferior/lateral ST depressions and nondiagnostic ST elevation in aVR similar to prior tracing. CT head revealed left vertex hematoma without skull fracture and no acute intracranial findings. MRI brain showed atrophy and microvascular changes consistent with age but no other suspicious findings. Hospitalists were consulted for admission.     Cardiology was consulted due to troponin elevation.  This was deemed to be secondary to demand ischemia, no further inpatient workup  planned.  Patient had no further nausea or vomiting and was tolerating diet on 3/27.  She worked with physical therapy who recommended skilled nursing facility due to weakness, however patient elected to go home with home health services.  Discharge Diagnoses:  Principal Problem:   Weakness Active Problems:   Nausea and vomiting   Chronic diastolic congestive heart failure (HCC)   Fall at home   Chronic kidney disease (CKD), stage III (moderate) (HCC)   High cholesterol   Non-insulin dependent type 2 diabetes mellitus (HCC)   Hypertension   Gout   Elevated troponin   Pressure injury of skin   History of breast cancer   Discharge Instructions  Discharge Instructions    Call MD for:  difficulty breathing, headache or visual disturbances   Complete by:  As directed    Call MD for:  extreme fatigue   Complete by:  As directed    Call MD for:  hives   Complete by:  As directed    Call MD for:  persistant dizziness or light-headedness   Complete by:  As directed    Call MD for:  persistant nausea and vomiting   Complete by:  As directed    Call MD for:  severe uncontrolled pain   Complete by:  As directed    Call MD for:  temperature >100.4   Complete by:  As directed    Diet - low sodium heart healthy   Complete by:  As directed    Discharge instructions   Complete by:  As directed    You were cared for by a  hospitalist during your hospital stay. If you have any questions about your discharge medications or the care you received while you were in the hospital after you are discharged, you can call the unit and asked to speak with the hospitalist on call if the hospitalist that took care of you is not available. Once you are discharged, your primary care physician will handle any further medical issues. Please note that NO REFILLS for any discharge medications will be authorized once you are discharged, as it is imperative that you return to your primary care physician (or establish  a relationship with a primary care physician if you do not have one) for your aftercare needs so that they can reassess your need for medications and monitor your lab values.   Increase activity slowly   Complete by:  As directed      Allergies as of 05/28/2017   No Known Allergies     Medication List    TAKE these medications   allopurinol 100 MG tablet Commonly known as:  ZYLOPRIM Take 1 tablet by mouth daily.   anastrozole 1 MG tablet Commonly known as:  ARIMIDEX Take 1 tablet (1 mg total) by mouth daily.   aspirin EC 81 MG tablet Take 1 tablet (81 mg total) by mouth daily.   atorvastatin 10 MG tablet Commonly known as:  LIPITOR Take 10 mg by mouth every evening.   BIOTIN 5000 PO Take 1 tablet by mouth daily.   cholecalciferol 1000 units tablet Commonly known as:  VITAMIN D Take 1,000 Units by mouth daily.   cloNIDine 0.2 MG tablet Commonly known as:  CATAPRES Take 0.2 mg by mouth 2 (two) times daily.   colchicine 0.6 MG tablet Take 0.6 mg by mouth as needed (GOUT).   docusate sodium 100 MG capsule Commonly known as:  COLACE Take 100 mg by mouth 2 (two) times daily as needed for mild constipation. Reported on 03/21/2015   furosemide 40 MG tablet Commonly known as:  LASIX TAKE 1 TABLET IN AM AND 1/2 TABLET IN THE PM. What changed:  See the new instructions.   gabapentin 100 MG capsule Commonly known as:  NEURONTIN Take 200 mg by mouth 3 (three) times daily.   glipiZIDE 2.5 mg Tabs tablet Commonly known as:  GLUCOTROL Take 0.5 tablets (2.5 mg total) by mouth 2 (two) times daily before a meal. What changed:    how much to take  when to take this   glucose blood test strip Use as instructed   glucose monitoring kit monitoring kit 1 each by Does not apply route as needed for other.   IRON PO Take 65 mg by mouth daily.   losartan 25 MG tablet Commonly known as:  COZAAR Take 25 mg by mouth daily.   traMADol 50 MG tablet Commonly known as:   ULTRAM Take 1 tablet (50 mg total) by mouth 2 (two) times daily. What changed:  when to take this   verapamil 240 MG 24 hr capsule Commonly known as:  VERELAN PM Take 240 mg by mouth daily.   vitamin C 500 MG tablet Commonly known as:  ASCORBIC ACID Take 500 mg by mouth daily.      Follow-up Information    [facility] Follow up.   Specialty:  Home Health Services Why:  [facility] nurse/physical/occupational therapy/aide,social worker Contact information: [address] [place] [address] [phone]        Sabrina Manes, MD. Schedule an appointment as soon as  possible for a visit in 1 week(s).   Specialty:  Internal Medicine Contact information: [address] [place] [address] [phone]          No Known Allergies  Consultations:  Cardiology    Procedures/Studies: Ct Head Wo Contrast  Result Date: 05/27/2017 CLINICAL DATA:  Fall with head impact EXAM: CT HEAD WITHOUT CONTRAST TECHNIQUE: Contiguous axial images were obtained from the base of the skull through the vertex without intravenous contrast. COMPARISON:  None. FINDINGS: Brain: No mass lesion, intraparenchymal hemorrhage or extra-axial collection. No evidence of acute cortical infarct. There is periventricular hypoattenuation compatible with chronic microvascular disease. Vascular: No hyperdense vessel or unexpected vascular calcification. Skull: Left vertex scalp hematoma.  No skull fracture. Sinuses/Orbits: No sinus fluid levels or advanced mucosal thickening. No mastoid effusion. Normal orbits. IMPRESSION: Small left vertex scalp hematoma without skull fracture or acute intracranial abnormality. Electronically Signed   By: Ulyses Jarred M.D.   On: 05/27/2017 00:44   Mr Brain Wo Contrast  Result Date: 05/27/2017 CLINICAL DATA:  Loss of balance and falling last night. Gait disturbance and nausea with dizziness. EXAM: MRI HEAD WITHOUT CONTRAST TECHNIQUE: Multiplanar,  multiecho pulse sequences of the brain and surrounding structures were obtained without intravenous contrast. COMPARISON:  Head CT same day. FINDINGS: Brain: Diffusion imaging does not show any acute or subacute infarction. The brainstem and cerebellum are normal. Cerebral hemispheres show mild age related volume loss and mild chronic appearing small vessel change of the white matter, fairly typical for age. No cortical or large vessel territory infarction. No mass lesion, hemorrhage, hydrocephalus or extra-axial collection. Vascular: Major vessels at the base of the brain show flow. Skull and upper cervical spine: Negative Sinuses/Orbits: Clear/normal Other: Left parietal vertex scalp swelling. IMPRESSION: No acute intracranial finding. Age related atrophy and mild chronic small-vessel change of the white matter, fairly typical for age. Electronically Signed   By: Nelson Chimes M.D.   On: 05/27/2017 07:03   Dg Chest Port 1 View  Result Date: 05/27/2017 CLINICAL DATA:  84 year old female with fall.  Elevated troponin. EXAM: PORTABLE CHEST 1 VIEW COMPARISON:  Chest radiograph dated 06/01/2014 FINDINGS: The lungs are clear. There is no pleural effusion or pneumothorax. The cardiac silhouette is within normal limits. No acute osseous pathology. Right axillary surgical clips. IMPRESSION: No active disease. Electronically Signed   By: Anner Crete M.D.   On: 05/27/2017 06:46    Echo Study Conclusions  - Procedure narrative: Transthoracic echocardiography. Image   quality was poor. The study was technically difficult, as a   result of poor sound wave transmission. - Left ventricle: The cavity size was normal. Wall thickness was   increased in a pattern of mild LVH. There was mild concentric   hypertrophy. Systolic function was normal. The estimated ejection   fraction was in the range of 55% to 60%. Wall motion was normal;   there were no regional wall motion abnormalities. Doppler   parameters are  consistent with abnormal left ventricular   relaxation (grade 1 diastolic dysfunction). - Aortic valve: There was mild stenosis. Valve area (VTI): 1.75   cm^2. Valve area (Vmax): 1.8 cm^2. Valve area (Vmean): 1.7 cm^2. - Mitral valve: Severely calcified annulus. Valve area by   continuity equation (using LVOT flow): 1.95 cm^2. - Atrial septum: No defect or patent foramen ovale was identified.    Discharge Exam: Vitals:   05/28/17 0536 05/28/17 1032  BP: (!) 154/55 (!) 157/57  Pulse: 88 82  Resp: 18   Temp: 98 F (36.7 C)   SpO2: 95%     General: Pt is alert, awake, not in acute distress Cardiovascular: RRR, S1/S2 +, no rubs, no gallops Respiratory: CTA bilaterally, no wheezing, no rhonchi Abdominal: Soft, NT, ND, bowel sounds + Extremities: no edema, no cyanosis    The results of significant diagnostics from this hospitalization (including imaging, microbiology, ancillary and laboratory) are listed below for reference.     Microbiology: No results found for this or any previous visit (from the past 240 hour(s)).   Labs: BNP (last 3 results) No results for input(s): BNP in the last 8760 hours. Basic Metabolic Panel: Recent Labs  Lab 05/26/17 2332 05/28/17 0001  NA 142 144  K 4.2 4.0  CL 104 106  CO2 26 25  GLUCOSE 195* 231*  BUN 62* 42*  CREATININE 1.85* 1.68*  CALCIUM 10.0 9.5   Liver Function Tests: No results for input(s): AST, ALT, ALKPHOS, BILITOT, PROT, ALBUMIN in the last 168 hours. No results for input(s): LIPASE, AMYLASE in the last 168 hours. No results for input(s): AMMONIA in the last 168 hours. CBC: Recent Labs  Lab 05/26/17 2332 05/28/17 0001  WBC 11.1* 15.3*  NEUTROABS 8.1*  --   HGB 12.2 12.7  HCT 36.9 39.6  MCV 97.1 99.2  PLT 215 267   Cardiac Enzymes: Recent Labs  Lab 05/26/17 2332 05/27/17 0331 05/27/17 1340 05/27/17 1809 05/28/17 0001  TROPONINI 0.09* 0.09* 0.15* 0.14* 0.15*   BNP: Invalid input(s): POCBNP CBG: Recent  Labs  Lab 05/26/17 2329 05/27/17 1654 05/27/17 2058 05/28/17 0734 05/28/17 1154  GLUCAP 195* 226* 219* 180* 207*   D-Dimer No results for input(s): DDIMER in the last 72 hours. Hgb A1c Recent Labs    05/27/17 1340  HGBA1C 7.4*   Lipid Profile No results for input(s): CHOL, HDL, LDLCALC, TRIG, CHOLHDL, LDLDIRECT in the last 72 hours. Thyroid function studies No results for input(s): TSH, T4TOTAL, T3FREE, THYROIDAB in the last 72 hours.  Invalid input(s): FREET3 Anemia work up No results for input(s): VITAMINB12, FOLATE, FERRITIN, TIBC, IRON, RETICCTPCT in the last 72 hours. Urinalysis    Component Value Date/Time   COLORURINE YELLOW 05/27/2017 0826   APPEARANCEUR CLEAR 05/27/2017 0826   LABSPEC 1.010 05/27/2017 0826   PHURINE 5.0 05/27/2017 0826   GLUCOSEU 50 (A) 05/27/2017 0826   HGBUR SMALL (A) 05/27/2017 0826   BILIRUBINUR NEGATIVE 05/27/2017 0826   KETONESUR 5 (A) 05/27/2017 0826   PROTEINUR 30 (A) 05/27/2017 0826   UROBILINOGEN 0.2 09/13/2013 0523   NITRITE NEGATIVE 05/27/2017 0826   LEUKOCYTESUR NEGATIVE 05/27/2017 0826   Sepsis Labs Invalid input(s): PROCALCITONIN,  WBC,  LACTICIDVEN Microbiology No results found for this or any previous visit (from the past 240 hour(s)).   Patient was seen and examined on the day of discharge and was found to be in stable condition. Time coordinating discharge: 30 minutes including assessment and coordination of care, as well as examination of the patient.   SIGNED:  Dessa Phi, DO [facility] Pager [phone]  If 7PM-7AM, please contact night-coverage [email or web address] Password [email or web address] 05/28/2017, 4:20 PM

## 2017-05-30 ENCOUNTER — Emergency Department (HOSPITAL_COMMUNITY): Payer: Medicare Other

## 2017-05-30 ENCOUNTER — Encounter (HOSPITAL_COMMUNITY): Payer: Self-pay

## 2017-05-30 ENCOUNTER — Other Ambulatory Visit: Payer: Self-pay

## 2017-05-30 ENCOUNTER — Inpatient Hospital Stay (HOSPITAL_COMMUNITY)
Admission: EM | Admit: 2017-05-30 | Discharge: 2017-06-04 | DRG: 064 | Disposition: A | Discharge status: Skilled Nursing Facility | Payer: Medicare Other | Attending: Family Medicine | Admitting: Family Medicine

## 2017-05-30 DIAGNOSIS — N184 Chronic kidney disease, stage 4 (severe): Secondary | ICD-10-CM | POA: Diagnosis present

## 2017-05-30 DIAGNOSIS — R531 Weakness: Secondary | ICD-10-CM

## 2017-05-30 DIAGNOSIS — Z79899 Other long term (current) drug therapy: Secondary | ICD-10-CM | POA: Diagnosis not present

## 2017-05-30 DIAGNOSIS — W1809XA Striking against other object with subsequent fall, initial encounter: Secondary | ICD-10-CM | POA: Diagnosis present

## 2017-05-30 DIAGNOSIS — Z86711 Personal history of pulmonary embolism: Secondary | ICD-10-CM | POA: Diagnosis not present

## 2017-05-30 DIAGNOSIS — H538 Other visual disturbances: Secondary | ICD-10-CM | POA: Diagnosis not present

## 2017-05-30 DIAGNOSIS — K5900 Constipation, unspecified: Secondary | ICD-10-CM | POA: Diagnosis not present

## 2017-05-30 DIAGNOSIS — S199XXA Unspecified injury of neck, initial encounter: Secondary | ICD-10-CM | POA: Diagnosis not present

## 2017-05-30 DIAGNOSIS — R5381 Other malaise: Secondary | ICD-10-CM

## 2017-05-30 DIAGNOSIS — S20219A Contusion of unspecified front wall of thorax, initial encounter: Secondary | ICD-10-CM | POA: Diagnosis not present

## 2017-05-30 DIAGNOSIS — Z853 Personal history of malignant neoplasm of breast: Secondary | ICD-10-CM | POA: Diagnosis not present

## 2017-05-30 DIAGNOSIS — I130 Hypertensive heart and chronic kidney disease with heart failure and stage 1 through stage 4 chronic kidney disease, or unspecified chronic kidney disease: Secondary | ICD-10-CM | POA: Diagnosis not present

## 2017-05-30 DIAGNOSIS — N179 Acute kidney failure, unspecified: Secondary | ICD-10-CM | POA: Diagnosis present

## 2017-05-30 DIAGNOSIS — R402362 Coma scale, best motor response, obeys commands, at arrival to emergency department: Secondary | ICD-10-CM | POA: Diagnosis present

## 2017-05-30 DIAGNOSIS — E119 Type 2 diabetes mellitus without complications: Secondary | ICD-10-CM

## 2017-05-30 DIAGNOSIS — M6281 Muscle weakness (generalized): Secondary | ICD-10-CM | POA: Diagnosis not present

## 2017-05-30 DIAGNOSIS — S20211A Contusion of right front wall of thorax, initial encounter: Secondary | ICD-10-CM

## 2017-05-30 DIAGNOSIS — C50411 Malignant neoplasm of upper-outer quadrant of right female breast: Secondary | ICD-10-CM | POA: Diagnosis present

## 2017-05-30 DIAGNOSIS — N183 Chronic kidney disease, stage 3 (moderate): Secondary | ICD-10-CM | POA: Diagnosis not present

## 2017-05-30 DIAGNOSIS — D509 Iron deficiency anemia, unspecified: Secondary | ICD-10-CM | POA: Diagnosis not present

## 2017-05-30 DIAGNOSIS — R278 Other lack of coordination: Secondary | ICD-10-CM | POA: Diagnosis not present

## 2017-05-30 DIAGNOSIS — S279XXA Injury of unspecified intrathoracic organ, initial encounter: Secondary | ICD-10-CM | POA: Diagnosis not present

## 2017-05-30 DIAGNOSIS — I63433 Cerebral infarction due to embolism of bilateral posterior cerebral arteries: Secondary | ICD-10-CM | POA: Diagnosis not present

## 2017-05-30 DIAGNOSIS — Z86718 Personal history of other venous thrombosis and embolism: Secondary | ICD-10-CM | POA: Diagnosis not present

## 2017-05-30 DIAGNOSIS — M109 Gout, unspecified: Secondary | ICD-10-CM | POA: Diagnosis not present

## 2017-05-30 DIAGNOSIS — R402252 Coma scale, best verbal response, oriented, at arrival to emergency department: Secondary | ICD-10-CM | POA: Diagnosis present

## 2017-05-30 DIAGNOSIS — R52 Pain, unspecified: Secondary | ICD-10-CM | POA: Diagnosis not present

## 2017-05-30 DIAGNOSIS — S0990XA Unspecified injury of head, initial encounter: Secondary | ICD-10-CM | POA: Diagnosis not present

## 2017-05-30 DIAGNOSIS — R2681 Unsteadiness on feet: Secondary | ICD-10-CM | POA: Diagnosis not present

## 2017-05-30 DIAGNOSIS — I639 Cerebral infarction, unspecified: Secondary | ICD-10-CM

## 2017-05-30 DIAGNOSIS — R402142 Coma scale, eyes open, spontaneous, at arrival to emergency department: Secondary | ICD-10-CM | POA: Diagnosis not present

## 2017-05-30 DIAGNOSIS — E118 Type 2 diabetes mellitus with unspecified complications: Secondary | ICD-10-CM | POA: Diagnosis not present

## 2017-05-30 DIAGNOSIS — E785 Hyperlipidemia, unspecified: Secondary | ICD-10-CM | POA: Diagnosis not present

## 2017-05-30 DIAGNOSIS — E1169 Type 2 diabetes mellitus with other specified complication: Secondary | ICD-10-CM | POA: Diagnosis not present

## 2017-05-30 DIAGNOSIS — E1122 Type 2 diabetes mellitus with diabetic chronic kidney disease: Secondary | ICD-10-CM

## 2017-05-30 DIAGNOSIS — D62 Acute posthemorrhagic anemia: Secondary | ICD-10-CM

## 2017-05-30 DIAGNOSIS — R0781 Pleurodynia: Secondary | ICD-10-CM | POA: Diagnosis not present

## 2017-05-30 DIAGNOSIS — E7800 Pure hypercholesterolemia, unspecified: Secondary | ICD-10-CM | POA: Diagnosis not present

## 2017-05-30 DIAGNOSIS — E1159 Type 2 diabetes mellitus with other circulatory complications: Secondary | ICD-10-CM | POA: Diagnosis present

## 2017-05-30 DIAGNOSIS — S299XXA Unspecified injury of thorax, initial encounter: Secondary | ICD-10-CM | POA: Diagnosis not present

## 2017-05-30 DIAGNOSIS — I5032 Chronic diastolic (congestive) heart failure: Secondary | ICD-10-CM | POA: Diagnosis not present

## 2017-05-30 DIAGNOSIS — Z170 Estrogen receptor positive status [ER+]: Secondary | ICD-10-CM | POA: Diagnosis not present

## 2017-05-30 DIAGNOSIS — I10 Essential (primary) hypertension: Secondary | ICD-10-CM | POA: Diagnosis present

## 2017-05-30 DIAGNOSIS — Y92009 Unspecified place in unspecified non-institutional (private) residence as the place of occurrence of the external cause: Secondary | ICD-10-CM | POA: Diagnosis not present

## 2017-05-30 DIAGNOSIS — Z803 Family history of malignant neoplasm of breast: Secondary | ICD-10-CM | POA: Diagnosis not present

## 2017-05-30 DIAGNOSIS — I2699 Other pulmonary embolism without acute cor pulmonale: Secondary | ICD-10-CM | POA: Diagnosis not present

## 2017-05-30 DIAGNOSIS — S20211D Contusion of right front wall of thorax, subsequent encounter: Secondary | ICD-10-CM | POA: Diagnosis not present

## 2017-05-30 DIAGNOSIS — E669 Obesity, unspecified: Secondary | ICD-10-CM | POA: Diagnosis not present

## 2017-05-30 DIAGNOSIS — Z9181 History of falling: Secondary | ICD-10-CM | POA: Diagnosis not present

## 2017-05-30 DIAGNOSIS — R296 Repeated falls: Secondary | ICD-10-CM | POA: Diagnosis not present

## 2017-05-30 DIAGNOSIS — C50919 Malignant neoplasm of unspecified site of unspecified female breast: Secondary | ICD-10-CM | POA: Diagnosis not present

## 2017-05-30 DIAGNOSIS — R29702 NIHSS score 2: Secondary | ICD-10-CM | POA: Diagnosis present

## 2017-05-30 DIAGNOSIS — E559 Vitamin D deficiency, unspecified: Secondary | ICD-10-CM | POA: Diagnosis not present

## 2017-05-30 LAB — DIFFERENTIAL
Basophils Absolute: 0 10*3/uL (ref 0.0–0.1)
Basophils Relative: 0 %
Eosinophils Absolute: 0.3 10*3/uL (ref 0.0–0.7)
Eosinophils Relative: 2 %
Lymphocytes Relative: 25 %
Lymphs Abs: 2.8 10*3/uL (ref 0.7–4.0)
Monocytes Absolute: 0.8 10*3/uL (ref 0.1–1.0)
Monocytes Relative: 7 %
Neutro Abs: 7.2 10*3/uL (ref 1.7–7.7)
Neutrophils Relative %: 66 %

## 2017-05-30 LAB — GLUCOSE, CAPILLARY: Glucose-Capillary: 215 mg/dL — ABNORMAL HIGH (ref 65–99)

## 2017-05-30 LAB — I-STAT TROPONIN, ED: Troponin i, poc: 0.06 ng/mL (ref 0.00–0.08)

## 2017-05-30 MED ORDER — STROKE: EARLY STAGES OF RECOVERY BOOK
Freq: Once | Status: DC
  Filled 2017-05-30: qty 1

## 2017-05-30 MED ORDER — ALLOPURINOL 100 MG PO TABS
100.0000 mg | ORAL_TABLET | Freq: Every day | ORAL | Status: DC
  Administered 2017-05-31 – 2017-06-04 (×5): 100 mg via ORAL
  Filled 2017-05-30 (×5): qty 1

## 2017-05-30 MED ORDER — ACETAMINOPHEN 160 MG/5ML PO SOLN: 650.0000 mg | ORAL | Status: DC | PRN

## 2017-05-30 MED ORDER — ASPIRIN EC 81 MG PO TBEC: 81.0000 mg | DELAYED_RELEASE_TABLET | Freq: Every day | ORAL | Status: DC

## 2017-05-30 MED ORDER — ASPIRIN EC 325 MG PO TBEC
325.0000 mg | DELAYED_RELEASE_TABLET | Freq: Every day | ORAL | Status: DC
  Administered 2017-05-31 – 2017-06-04 (×5): 325 mg via ORAL
  Filled 2017-05-30 (×5): qty 1

## 2017-05-30 MED ORDER — ATORVASTATIN CALCIUM 40 MG PO TABS
40.0000 mg | ORAL_TABLET | Freq: Every evening | ORAL | Status: DC
  Administered 2017-05-31 – 2017-06-03 (×4): 40 mg via ORAL
  Filled 2017-05-30 (×4): qty 1

## 2017-05-30 MED ORDER — OXYCODONE-ACETAMINOPHEN 5-325 MG PO TABS
1.0000 | ORAL_TABLET | ORAL | Status: DC | PRN
  Administered 2017-05-30: 1 via ORAL
  Administered 2017-05-31: 2 via ORAL
  Administered 2017-05-31 – 2017-06-01 (×3): 1 via ORAL
  Filled 2017-05-30: qty 1
  Filled 2017-05-30: qty 2
  Filled 2017-05-30 (×3): qty 1

## 2017-05-30 MED ORDER — VITAMIN D 1000 UNITS PO TABS
1000.0000 [IU] | ORAL_TABLET | Freq: Every day | ORAL | Status: DC
  Administered 2017-05-31 – 2017-06-04 (×5): 1000 [IU] via ORAL
  Filled 2017-05-30 (×5): qty 1

## 2017-05-30 MED ORDER — SENNOSIDES-DOCUSATE SODIUM 8.6-50 MG PO TABS
1.0000 | ORAL_TABLET | Freq: Every evening | ORAL | Status: DC | PRN
  Administered 2017-05-31: 1 via ORAL
  Filled 2017-05-30 (×2): qty 1

## 2017-05-30 MED ORDER — ANASTROZOLE 1 MG PO TABS
1.0000 mg | ORAL_TABLET | Freq: Every day | ORAL | Status: DC
  Administered 2017-05-31 – 2017-06-04 (×5): 1 mg via ORAL
  Filled 2017-05-30 (×5): qty 1

## 2017-05-30 MED ORDER — COLCHICINE 0.6 MG PO TABS: 0.6000 mg | ORAL_TABLET | ORAL | Status: DC | PRN

## 2017-05-30 MED ORDER — HEPARIN SODIUM (PORCINE) 5000 UNIT/ML IJ SOLN
5000.0000 [IU] | Freq: Three times a day (TID) | INTRAMUSCULAR | Status: DC
  Administered 2017-05-31 – 2017-06-04 (×15): 5000 [IU] via SUBCUTANEOUS
  Filled 2017-05-30 (×15): qty 1

## 2017-05-30 MED ORDER — MORPHINE SULFATE (PF) 4 MG/ML IV SOLN: 1.0000 mg | INTRAVENOUS | Status: DC | PRN

## 2017-05-30 MED ORDER — SODIUM CHLORIDE 0.9 % IV SOLN: INTRAVENOUS | Status: DC

## 2017-05-30 MED ORDER — ATORVASTATIN CALCIUM 10 MG PO TABS: 10.0000 mg | ORAL_TABLET | Freq: Every evening | ORAL | Status: DC

## 2017-05-30 MED ORDER — ACETAMINOPHEN 325 MG PO TABS: 650.0000 mg | ORAL_TABLET | ORAL | Status: DC | PRN

## 2017-05-30 MED ORDER — ACETAMINOPHEN 500 MG PO TABS
1000.0000 mg | ORAL_TABLET | Freq: Once | ORAL | Status: AC
  Administered 2017-05-30: 1000 mg via ORAL
  Filled 2017-05-30: qty 2

## 2017-05-30 MED ORDER — INSULIN ASPART 100 UNIT/ML ~~LOC~~ SOLN
0.0000 [IU] | Freq: Three times a day (TID) | SUBCUTANEOUS | Status: DC
  Administered 2017-05-31: 3 [IU] via SUBCUTANEOUS
  Administered 2017-05-31: 2 [IU] via SUBCUTANEOUS
  Administered 2017-05-31: 3 [IU] via SUBCUTANEOUS
  Administered 2017-05-31 – 2017-06-02 (×5): 2 [IU] via SUBCUTANEOUS
  Administered 2017-06-03: 1 [IU] via SUBCUTANEOUS
  Administered 2017-06-03: 2 [IU] via SUBCUTANEOUS
  Administered 2017-06-04: 1 [IU] via SUBCUTANEOUS
  Administered 2017-06-04: 2 [IU] via SUBCUTANEOUS

## 2017-05-30 MED ORDER — ASPIRIN 81 MG PO CHEW
324.0000 mg | CHEWABLE_TABLET | Freq: Once | ORAL | Status: AC
  Administered 2017-05-30: 324 mg via ORAL
  Filled 2017-05-30: qty 4

## 2017-05-30 MED ORDER — ACETAMINOPHEN 650 MG RE SUPP: 650.0000 mg | RECTAL | Status: DC | PRN

## 2017-05-30 NOTE — Care Management (Signed)
ED CM consulted concerning patient, CM met with patient, and sister Garald Balding [phone] at bedside. Patient reports that she lives at home with husband who has dementia.  Patient and husband were both evaluated at the [facility] ED yesterday they were transitioned home with [facility] services. Patient states that they have PCS with [facility] services a couple of hours/day.  But wife is concerned that husband will be left alone. As per patient she has gotten some neighbors to check on him tonight the family is concerned about if [facility] will still come out to visit the husband.  Patient states she is worried about husband taking medications. CM explained HHRN  services would include medication management. Family also requested a private duty list for additional assist should they need, private duty list provided. CM contacted [facility] and informed that [facility] will be out sometime this weekend, and [facility] SW schedule to visit on Tuesday 06/03/17. Updated patient and family and provided them with [facility] contact information. CM explained that a CM and CSW will follow up on the unit.

## 2017-05-30 NOTE — ED Notes (Signed)
Patient transported to MRI 

## 2017-05-30 NOTE — ED Provider Notes (Signed)
[facility] EMERGENCY DEPARTMENT Provider Note   CSN: [id number] Arrival date & time: 05/30/17  1028     History   Chief Complaint Chief Complaint  Patient presents with  . Chest Pain    HPI Sabrina [name] is a 84 y.o. female.  Pt presents to the ED today with chest pain and blurry vision.  The pt was admitted from 3/25-3/27 for frequent falls.  She had a negative brain MRI on 3/26.  She said when she got home, she found that her husband, who has dementia, had not been eating or drinking or taking his medications.  She has been under a lot of stress due to that and has been trying to decide what to do about her living situation as they are both at home.  The pt said she has had blurry vision for the past few days.  She had her glasses checked yesterday and they were ok.  She made an appt with her eye doctor for Monday the 1st.  She said she was preparing dinner last night and because she could not see well, she tripped against her walker and hit her right lower chest with her walker.  She was unable to get up, so EMS came and helped her up.  She did not want to go to the hospital last night because she was afraid to leave her husband.  She said it only hurts when it is touched.  It still hurts today and she had someone to watch her husband, so she came today.  The blurry vision is in both eyes and seems to be all visual fields.     Past Medical History:  Diagnosis Date  . Arthritis    neck  . Breast cancer ([place]) 03/2013   right  . Chronic kidney disease (CKD), stage III (moderate) (HCC)    nephrologist, Dr. [name]  . Dental crowns present   . Heart murmur    no known problems; states did not know she had murmur until age 25  . High cholesterol   . Hypertension    fluctuates, especially when stressed; has been on med. > 20 yr.  . Immature cataract   . Non-insulin dependent type 2 diabetes mellitus ([place])   . Radiation 09/06/13-10/20/13   Right Breast  Cancer  . Wears partial dentures    lower    Patient Active Problem List   Diagnosis Date Noted  . History of breast cancer 05/30/2017  . Weakness 05/30/2017  . Pressure injury of skin 05/28/2017  . Fall at home 05/27/2017  . Chronic kidney disease (CKD), stage III (moderate) ([place]) 05/27/2017  . High cholesterol 05/27/2017  . Non-insulin dependent type 2 diabetes mellitus ([place]) 05/27/2017  . Hypertension 05/27/2017  . Gout 05/27/2017  . Elevated troponin 05/27/2017  . Lipoma of lower extremity 09/12/2014  . Abnormal x-ray 03/15/2014  . Chronic diastolic congestive heart failure ([place]) 02/23/2014  . Osteopenia 11/30/2013  . Hypoglycemia 09/13/2013  . Acute respiratory failure with hypoxia ([place]) 06/14/2013  . Pulmonary embolism ([place]) 06/10/2013  . Nausea and vomiting 06/10/2013  . Accelerated hypertension 06/10/2013  . Aortic stenosis 04/22/2013  . Edema leg 04/22/2013  . Breast cancer of upper-outer quadrant of right female breast ([place]) 03/19/2013    Past Surgical History:  Procedure Laterality Date  . AXILLARY LYMPH NODE DISSECTION Right 03/30/2013   Procedure: AXILLARY LYMPH NODE DISSECTION;  Surgeon: Rolm Bookbinder, MD;  Location: [facility];  Service: General;  Laterality: Right;  .  BREAST LUMPECTOMY WITH NEEDLE LOCALIZATION Right 03/30/2013   Procedure: BREAST LUMPECTOMY WITH NEEDLE LOCALIZATION;  Surgeon: Rolm Bookbinder, MD;  Location: [facility];  Service: General;  Laterality: Right;  . BREAST SURGERY Right 11/1958   right breast biopsy benign  . DILATION AND CURETTAGE OF UTERUS    . PORTACATH PLACEMENT N/A 04/15/2013   Procedure: INSERTION PORT-A-CATH;  Surgeon: Rolm Bookbinder, MD;  Location: [facility];  Service: General;  Laterality: N/A;  . RE-EXCISION OF BREAST CANCER,SUPERIOR MARGINS Right 04/15/2013   Procedure: RE-EXCISION OF RIGHT BREAST  MARGINS;  Surgeon: Rolm Bookbinder, MD;  Location: [facility];  Service: General;  Laterality:  Right;  . TONSILLECTOMY     as a child     OB History   None    Obstetric Comments  Menarche ae 14 No children Married 19 years         Home Medications    Prior to Admission medications   Medication Sig Start Date End Date Taking? Authorizing Provider  allopurinol (ZYLOPRIM) 100 MG tablet Take 100 mg by mouth daily.  01/17/16  Yes [provider]  anastrozole (ARIMIDEX) 1 MG tablet Take 1 tablet (1 mg total) by mouth daily. 07/30/16  Yes Magrinat, Virgie Dad, MD  aspirin EC 81 MG tablet Take 1 tablet (81 mg total) by mouth daily. 12/21/14  Yes Magrinat, Virgie Dad, MD  atorvastatin (LIPITOR) 10 MG tablet Take 10 mg by mouth every evening.    Yes [provider]  BIOTIN 5000 PO Take 1 tablet by mouth daily.   Yes [provider]  cholecalciferol (VITAMIN D) 1000 UNITS tablet Take 1,000 Units by mouth daily.   Yes [provider]  cloNIDine (CATAPRES) 0.2 MG tablet Take 0.2 mg by mouth 2 (two) times daily.   Yes [provider]  colchicine 0.6 MG tablet Take 0.6 mg by mouth as needed (GOUT).    Yes [provider]  docusate sodium (COLACE) 100 MG capsule Take 100 mg by mouth 2 (two) times daily as needed for mild constipation. Reported on 03/21/2015   Yes [provider]  furosemide (LASIX) 40 MG tablet TAKE 1 TABLET IN AM AND 1/2 TABLET IN THE PM. Patient taking differently: TAKE 40 mg  TABLET EVERY OTHER DAY 04/08/16  Yes Bensimhon, Shaune Pascal, MD  gabapentin (NEURONTIN) 100 MG capsule Take 200 mg by mouth 3 (three) times daily.  05/19/17  Yes [provider]  glipiZIDE (GLUCOTROL) 2.5 mg TABS tablet Take 0.5 tablets (2.5 mg total) by mouth 2 (two) times daily before a meal. Patient taking differently: Take 2.5 mg by mouth See admin instructions. Take 2.5 in the morning and 1.25 09/14/13  Yes Buriev, Arie Sabina, MD  IRON PO Take 65 mg by mouth daily.   Yes [provider]  losartan (COZAAR) 25 MG tablet Take 25  mg by mouth daily.  11/02/13  Yes [provider]  traMADol (ULTRAM) 50 MG tablet Take 1 tablet (50 mg total) by mouth 2 (two) times daily. Patient taking differently: Take 50 mg by mouth See admin instructions. Take 50 mg in the morning and take 50 mg  as needed in the evening 06/19/13  Yes Theodis Blaze, MD  verapamil (VERELAN PM) 240 MG 24 hr capsule Take 240 mg by mouth daily.    Yes [provider]  vitamin C (ASCORBIC ACID) 500 MG tablet Take 500 mg by mouth daily.   Yes [provider]  Family History Family History  Problem Relation Age of Onset  . Pneumonia Mother   . Heart attack Father   . Breast cancer Other 54       niece  . Breast cancer Other 50       niece    Social History Social History   Tobacco Use  . Smoking status: Never Smoker  . Smokeless tobacco: Never Used  . Tobacco comment: only smoked 2 packs cigarettes total  Substance Use Topics  . Alcohol use: Yes    Alcohol/week: 0.0 oz    Comment: 2-3 glasses wine/week  . Drug use: No     Allergies   Patient has no known allergies.   Review of Systems Review of Systems  Eyes: Positive for visual disturbance.  Musculoskeletal:       Chest wall pain  All other systems reviewed and are negative.    Physical Exam Updated Vital Signs BP (!) 118/46   Pulse 65   Temp 98 F (36.7 C) (Oral)   Resp (!) 21   SpO2 98%   Physical Exam  Constitutional: She is oriented to person, place, and time. She appears well-developed and well-nourished.  HENT:  Head: Normocephalic and atraumatic.  Eyes: Pupils are equal, round, and reactive to light. EOM are normal.  Neck: Normal range of motion. Neck supple.  Cardiovascular: Normal rate, regular rhythm, intact distal pulses and normal pulses.  Pulmonary/Chest: Effort normal and breath sounds normal.  Abdominal:    Musculoskeletal: Normal range of motion.       Right lower leg: Normal.       Left lower leg: Normal.  Neurological:  She is alert and oriented to person, place, and time.  Skin: Skin is warm and dry. Capillary refill takes less than 2 seconds.  Psychiatric: She has a normal mood and affect. Her behavior is normal.  Nursing note and vitals reviewed.    ED Treatments / Results  Labs (all labs ordered are listed, but only abnormal results are displayed) Labs Reviewed - No data to display  EKG EKG Interpretation  Date/Time:  Friday May 30 2017 10:29:09 EDT Ventricular Rate:  65 PR Interval:    QRS Duration: 87 QT Interval:  433 QTC Calculation: 451 R Axis:   15 Text Interpretation:  Sinus rhythm No significant change since last tracing Confirmed by Isla Pence [phone]) on 05/30/2017 11:24:22 AM   Radiology Dg Ribs Unilateral W/chest Right  Result Date: 05/30/2017 CLINICAL DATA:  Fall landing on walker.  Right rib pain. EXAM: RIGHT RIBS AND CHEST - 3+ VIEW COMPARISON:  Chest x-ray from 3 days ago FINDINGS: Artifact from EKG leads. Cholelithiasis. There is no edema, consolidation, effusion, or pneumothorax. Hazy peripheral opacity at the right apex that is chronic based on 2017 chest CT and likely from radiation fibrosis. Normal heart size. Stable mediastinal contours. Mitral annular calcification. Postoperative right axilla and breast. No visible rib fracture. IMPRESSION: 1. No visible rib fracture or intrathoracic injury. 2. Cholelithiasis. Electronically Signed   By: Monte Fantasia M.D.   On: 05/30/2017 12:41   Ct Head Wo Contrast  Result Date: 05/30/2017 CLINICAL DATA:  Ground level fall last night. EXAM: CT HEAD WITHOUT CONTRAST CT CERVICAL SPINE WITHOUT CONTRAST TECHNIQUE: Multidetector CT imaging of the head and cervical spine was performed following the standard protocol without intravenous contrast. Multiplanar CT image reconstructions of the cervical spine were also generated. COMPARISON:  CT head dated May 27, 2017. FINDINGS: CT HEAD FINDINGS Brain: No evidence  of acute infarction,  hemorrhage, hydrocephalus, extra-axial collection or mass lesion/mass effect. Apparent hypodensity within the right greater than left occipital lobes. Stable mild atrophy and chronic microvascular ischemic changes. Vascular: Calcified atherosclerosis at the skullbase. No hyperdense vessel. Skull: Normal. Negative for fracture or focal lesion. Sinuses/Orbits: No acute finding. Other: None. CT CERVICAL SPINE FINDINGS Alignment: Trace stepwise anterolisthesis at C3-C4, C4-C5, and C5-C6 due to facet arthropathy. No traumatic malalignment. Skull base and vertebrae: No acute fracture. No primary bone lesion or focal pathologic process. Soft tissues and spinal canal: No prevertebral fluid or swelling. No visible canal hematoma. Disc levels: Mild to moderate disc height loss and uncovertebral hypertrophy from C5-C6 through C7-T1. Moderate to severe bilateral facet arthropathy throughout the cervical spine. Upper chest: Scarring/fibrosis in the right lung apex, possibly from prior radiation. Other: None. IMPRESSION: 1. No definite acute intracranial abnormality. Apparent hypodensity within the right greater than left occipital lobes is favored artifactual, less likely due to infarct or posterior reversible encephalopathy syndrome. Correlate for visual symptoms or hypertensive urgency. 2. No acute cervical spine fracture. Mild-to-moderate degenerative disc disease in the lower cervical spine. Moderate to severe facet arthropathy throughout the cervical spine. Electronically Signed   By: Titus Dubin M.D.   On: 05/30/2017 12:35   Ct Cervical Spine Wo Contrast  Result Date: 05/30/2017 CLINICAL DATA:  Ground level fall last night. EXAM: CT HEAD WITHOUT CONTRAST CT CERVICAL SPINE WITHOUT CONTRAST TECHNIQUE: Multidetector CT imaging of the head and cervical spine was performed following the standard protocol without intravenous contrast. Multiplanar CT image reconstructions of the cervical spine were also generated.  COMPARISON:  CT head dated May 27, 2017. FINDINGS: CT HEAD FINDINGS Brain: No evidence of acute infarction, hemorrhage, hydrocephalus, extra-axial collection or mass lesion/mass effect. Apparent hypodensity within the right greater than left occipital lobes. Stable mild atrophy and chronic microvascular ischemic changes. Vascular: Calcified atherosclerosis at the skullbase. No hyperdense vessel. Skull: Normal. Negative for fracture or focal lesion. Sinuses/Orbits: No acute finding. Other: None. CT CERVICAL SPINE FINDINGS Alignment: Trace stepwise anterolisthesis at C3-C4, C4-C5, and C5-C6 due to facet arthropathy. No traumatic malalignment. Skull base and vertebrae: No acute fracture. No primary bone lesion or focal pathologic process. Soft tissues and spinal canal: No prevertebral fluid or swelling. No visible canal hematoma. Disc levels: Mild to moderate disc height loss and uncovertebral hypertrophy from C5-C6 through C7-T1. Moderate to severe bilateral facet arthropathy throughout the cervical spine. Upper chest: Scarring/fibrosis in the right lung apex, possibly from prior radiation. Other: None. IMPRESSION: 1. No definite acute intracranial abnormality. Apparent hypodensity within the right greater than left occipital lobes is favored artifactual, less likely due to infarct or posterior reversible encephalopathy syndrome. Correlate for visual symptoms or hypertensive urgency. 2. No acute cervical spine fracture. Mild-to-moderate degenerative disc disease in the lower cervical spine. Moderate to severe facet arthropathy throughout the cervical spine. Electronically Signed   By: Titus Dubin M.D.   On: 05/30/2017 12:35    Procedures Procedures (including critical care time)  Medications Ordered in ED Medications  acetaminophen (TYLENOL) tablet 1,000 mg (1,000 mg Oral Given 05/30/17 1329)     Initial Impression / Assessment and Plan / ED Course  I have reviewed the triage vital signs and the  nursing notes.  Pertinent labs & imaging results that were available during my care of the patient were reviewed by me and considered in my medical decision making (see chart for details).    Due to the blurry vision which pt said  is new since discharge and the abnormalities in the CT, I ordered a MRI.  Results are pending and pt to be signed out to Dr. Jeneen Rinks.  If MRI is ok, pt does have an appt with her eye doctor on Monday the 1st.  Final Clinical Impressions(s) / ED Diagnoses   Final diagnoses:  Chest wall contusion, right, initial encounter  Blurry vision, bilateral    ED Discharge Orders    None       Isla Pence, MD 05/30/17 1544

## 2017-05-30 NOTE — Discharge Instructions (Addendum)
Follow up with your eye doctor as scheduled.

## 2017-05-30 NOTE — ED Triage Notes (Signed)
Pt presents to ED via EMS from home with complaints of CP after ground level fall last night. Pt states her walker landed on chest and in causing pain to center of chest. Pain worse with movement.   Pt received 324mg  ASA.

## 2017-05-30 NOTE — H&P (Signed)
History and Physical    Sabrina [name] IWP:[id number] DOB: December 20, 1926 DOA: 05/30/2017   PCP: [name], MD   Patient coming from:  Home    Chief Complaint: Right sided pain after contusion due to frequent falls  and bilateral blurry vision   HPI: Sabrina [name] is a 84 y.o. female with a history of right breast cancer in 2015, currently on Arimidex, CKD stage 4, history of DVT in the remote past, hypertension, hyperlipidemia, diabetes, not on medications, peripheral neuropathy, and a recent admission to the hospital from 3/25 until 05/28/2017 for evaluation of frequent falls, with negative brain MRI on 05/27/2017, brought to the emergency department, after blurred vision claims since yesterday, she was preparing dinner.  She reported not being able to see well, tripping against a walker, hitting the right lower chest with her walker.  She was unable to get up, therefore being brought by EMS to help her.  The patient did not want to come to the hospital last night, because she was afraid to leave her husband, who is sick.  However, her blurred vision is now in both eyes, in all visual fields. She denies any fever, chills, night sweats, or headaches.  She does not report dysphagia, or decreased sensation in any facial field.  She denies any nausea or vomiting, vertigo, or dizziness.  Denies any syncope or presyncope.  She denies any failure to thrive.  She denies any abdominal pain, dysuria, hematuria, bowel or urine incontinence.  She denies any lower extremity swelling or calf pain.   ED Course:  BP (!) 154/51   Pulse 71   Temp 98 F (36.7 C) (Oral)   Resp (!) 21   SpO2 99%   Her last 2D echo in 05/27/2017 showed normal systolic, EF 50-53%, with grade 1 diastolic. New 2D echo is being performed, as cardioembolic disease is suspected. Sodium 144, potassium 4, glucose 231, creatinine 1.68, at baseline.  Her EF is 26 Troponin is pending White count 15.3, hemoglobin 12.7, platelets 267 Globin A1c  7.4 Urinalysis pending But I of the brain shows bilateral cortical occipital infarcts  Review of Systems:  As per HPI otherwise all other systems reviewed and are negative  Past Medical History:  Diagnosis Date  . Arthritis    neck  . Breast cancer ([place]) 03/2013   right  . Chronic kidney disease (CKD), stage III (moderate) (HCC)    nephrologist, Dr. [name]  . Dental crowns present   . Heart murmur    no known problems; states did not know she had murmur until age 52  . High cholesterol   . Hypertension    fluctuates, especially when stressed; has been on med. > 20 yr.  . Immature cataract   . Non-insulin dependent type 2 diabetes mellitus ([place])   . Radiation 09/06/13-10/20/13   Right Breast Cancer  . Wears partial dentures    lower    Past Surgical History:  Procedure Laterality Date  . AXILLARY LYMPH NODE DISSECTION Right 03/30/2013   Procedure: AXILLARY LYMPH NODE DISSECTION;  Surgeon: Rolm Bookbinder, MD;  Location: [place];  Service: General;  Laterality: Right;  . BREAST LUMPECTOMY WITH NEEDLE LOCALIZATION Right 03/30/2013   Procedure: BREAST LUMPECTOMY WITH NEEDLE LOCALIZATION;  Surgeon: Rolm Bookbinder, MD;  Location: [place];  Service: General;  Laterality: Right;  . BREAST SURGERY Right 11/1958   right breast biopsy benign  . DILATION AND CURETTAGE OF UTERUS    . PORTACATH PLACEMENT N/A 04/15/2013  Procedure: INSERTION PORT-A-CATH;  Surgeon: Rolm Bookbinder, MD;  Location: [place];  Service: General;  Laterality: N/A;  . RE-EXCISION OF BREAST CANCER,SUPERIOR MARGINS Right 04/15/2013   Procedure: RE-EXCISION OF RIGHT BREAST  MARGINS;  Surgeon: Rolm Bookbinder, MD;  Location: [place];  Service: General;  Laterality: Right;  . TONSILLECTOMY     as a child    Social History Social History   Socioeconomic History  . Marital status: Married    Spouse name: Not on file  . Number of children: 0  . Years of education:  Not on file  . Highest education level: Not on file  Occupational History  . Not on file  Social Needs  . Financial resource strain: Not on file  . Food insecurity:    Worry: Not on file    Inability: Not on file  . Transportation needs:    Medical: Not on file    Non-medical: Not on file  Tobacco Use  . Smoking status: Never Smoker  . Smokeless tobacco: Never Used  . Tobacco comment: only smoked 2 packs cigarettes total  Substance and Sexual Activity  . Alcohol use: Yes    Alcohol/week: 0.0 oz    Comment: 2-3 glasses wine/week  . Drug use: No  . Sexual activity: Not Currently  Lifestyle  . Physical activity:    Days per week: Not on file    Minutes per session: Not on file  . Stress: Not on file  Relationships  . Social connections:    Talks on phone: Not on file    Gets together: Not on file    Attends religious service: Not on file    Active member of club or organization: Not on file    Attends meetings of clubs or organizations: Not on file    Relationship status: Not on file  . Intimate partner violence:    Fear of current or ex partner: Not on file    Emotionally abused: Not on file    Physically abused: Not on file    Forced sexual activity: Not on file  Other Topics Concern  . Not on file  Social History Narrative  . Not on file     No Known Allergies  Family History  Problem Relation Age of Onset  . Pneumonia Mother   . Heart attack Father   . Breast cancer Other 33       niece  . Breast cancer Other 39       niece      Prior to Admission medications   Medication Sig Start Date End Date Taking? Authorizing Provider  allopurinol (ZYLOPRIM) 100 MG tablet Take 100 mg by mouth daily.  01/17/16  Yes [provider]  anastrozole (ARIMIDEX) 1 MG tablet Take 1 tablet (1 mg total) by mouth daily. 07/30/16  Yes Magrinat, Virgie Dad, MD  aspirin EC 81 MG tablet Take 1 tablet (81 mg total) by mouth daily. 12/21/14  Yes Magrinat, Virgie Dad, MD    atorvastatin (LIPITOR) 10 MG tablet Take 10 mg by mouth every evening.    Yes [provider]  BIOTIN 5000 PO Take 1 tablet by mouth daily.   Yes [provider]  cholecalciferol (VITAMIN D) 1000 UNITS tablet Take 1,000 Units by mouth daily.   Yes [provider]  cloNIDine (CATAPRES) 0.2 MG tablet Take 0.2 mg by mouth 2 (two) times daily.   Yes [provider]  colchicine 0.6 MG tablet Take  0.6 mg by mouth as needed (GOUT).    Yes [provider]  docusate sodium (COLACE) 100 MG capsule Take 100 mg by mouth 2 (two) times daily as needed for mild constipation. Reported on 03/21/2015   Yes [provider]  furosemide (LASIX) 40 MG tablet TAKE 1 TABLET IN AM AND 1/2 TABLET IN THE PM. Patient taking differently: TAKE 40 mg  TABLET EVERY OTHER DAY 04/08/16  Yes Bensimhon, Shaune Pascal, MD  gabapentin (NEURONTIN) 100 MG capsule Take 200 mg by mouth 3 (three) times daily.  05/19/17  Yes [provider]  glipiZIDE (GLUCOTROL) 2.5 mg TABS tablet Take 0.5 tablets (2.5 mg total) by mouth 2 (two) times daily before a meal. Patient taking differently: Take 2.5 mg by mouth See admin instructions. Take 2.5 in the morning and 1.25 09/14/13  Yes Buriev, Arie Sabina, MD  IRON PO Take 65 mg by mouth daily.   Yes [provider]  losartan (COZAAR) 25 MG tablet Take 25 mg by mouth daily.  11/02/13  Yes [provider]  traMADol (ULTRAM) 50 MG tablet Take 1 tablet (50 mg total) by mouth 2 (two) times daily. Patient taking differently: Take 50 mg by mouth See admin instructions. Take 50 mg in the morning and take 50 mg  as needed in the evening 06/19/13  Yes Theodis Blaze, MD  verapamil (VERELAN PM) 240 MG 24 hr capsule Take 240 mg by mouth daily.    Yes [provider]  vitamin C (ASCORBIC ACID) 500 MG tablet Take 500 mg by mouth daily.   Yes [provider]    Physical Exam:  Vitals:   05/30/17 1300 05/30/17 1326 05/30/17 1345  05/30/17 1611  BP: (!) 102/41 (!) 120/44 (!) 118/46 (!) 154/51  Pulse: (!) 56 61 65 71  Resp: 13 (!) 21 (!) 21   Temp:      TempSrc:      SpO2: 97% 97% 98% 99%   Constitutional: NAD, calm, comfortable Eyes: Patient has bilateral blurred vision, lids and conjunctivae normal ENMT: Mucous membranes are moist, without exudate or lesions  Neck: normal, supple, no masses, no thyromegaly Respiratory: clear to auscultation bilaterally, no wheezing, no crackles. Normal respiratory effort  Cardiovascular: Regular rate and rhythm, 2 out of 6 systolic murmur, rubs or gallops. No extremity edema. 2+ pedal pulses. No carotid bruits.  Abdomen: Soft, obese, protuberant, non tender, No hepatosplenomegaly. Bowel sounds positive.  Musculoskeletal: no clubbing / cyanosis. Moves all upper bilateral lower extremity is move with difficulty, due to arthritis, but no apparent neurological issues Skin: no jaundice, No lesions.  Neurologic: Sensation intact  mild left pronator drift, as well as right hand tremor at rest, LUE 4/5 strength  Psychiatric:   Alert and oriented x 3. Normal mood.     Labs on Admission: I have personally reviewed following labs and imaging studies  CBC: Recent Labs  Lab 05/26/17 2332 05/28/17 0001  WBC 11.1* 15.3*  NEUTROABS 8.1*  --   HGB 12.2 12.7  HCT 36.9 39.6  MCV 97.1 99.2  PLT 215 481    Basic Metabolic Panel: Recent Labs  Lab 05/26/17 2332 05/28/17 0001  NA 142 144  K 4.2 4.0  CL 104 106  CO2 26 25  GLUCOSE 195* 231*  BUN 62* 42*  CREATININE 1.85* 1.68*  CALCIUM 10.0 9.5    GFR: Estimated Creatinine Clearance: 20.4 mL/min (A) (by C-G formula based on SCr of 1.68 mg/dL (H)).  Liver Function Tests:  No results for input(s): AST, ALT, ALKPHOS, BILITOT, PROT, ALBUMIN in the last 168 hours. No results for input(s): LIPASE, AMYLASE in the last 168 hours. No results for input(s): AMMONIA in the last 168 hours.  Coagulation Profile: No results for input(s):  INR, PROTIME in the last 168 hours.  Cardiac Enzymes: Recent Labs  Lab 05/26/17 2332 05/27/17 0331 05/27/17 1340 05/27/17 1809 05/28/17 0001  TROPONINI 0.09* 0.09* 0.15* 0.14* 0.15*    BNP (last 3 results) No results for input(s): PROBNP in the last 8760 hours.  HbA1C: No results for input(s): HGBA1C in the last 72 hours.  CBG: Recent Labs  Lab 05/27/17 1654 05/27/17 2058 05/28/17 0734 05/28/17 1154 05/28/17 1640  GLUCAP 226* 219* 180* 207* 95    Lipid Profile: No results for input(s): CHOL, HDL, LDLCALC, TRIG, CHOLHDL, LDLDIRECT in the last 72 hours.  Thyroid Function Tests: No results for input(s): TSH, T4TOTAL, FREET4, T3FREE, THYROIDAB in the last 72 hours.  Anemia Panel: No results for input(s): VITAMINB12, FOLATE, FERRITIN, TIBC, IRON, RETICCTPCT in the last 72 hours.  Urine analysis:    Component Value Date/Time   COLORURINE YELLOW 05/27/2017 0826   APPEARANCEUR CLEAR 05/27/2017 0826   LABSPEC 1.010 05/27/2017 0826   PHURINE 5.0 05/27/2017 0826   GLUCOSEU 50 (A) 05/27/2017 0826   HGBUR SMALL (A) 05/27/2017 0826   BILIRUBINUR NEGATIVE 05/27/2017 0826   KETONESUR 5 (A) 05/27/2017 0826   PROTEINUR 30 (A) 05/27/2017 0826   UROBILINOGEN 0.2 09/13/2013 0523   NITRITE NEGATIVE 05/27/2017 0826   LEUKOCYTESUR NEGATIVE 05/27/2017 0826    Sepsis Labs: @LABRCNTIP (procalcitonin:4,lacticidven:4) )No results found for this or any previous visit (from the past 240 hour(s)).   Radiological Exams on Admission: Dg Ribs Unilateral W/chest Right  Result Date: 05/30/2017 CLINICAL DATA:  Fall landing on walker.  Right rib pain. EXAM: RIGHT RIBS AND CHEST - 3+ VIEW COMPARISON:  Chest x-ray from 3 days ago FINDINGS: Artifact from EKG leads. Cholelithiasis. There is no edema, consolidation, effusion, or pneumothorax. Hazy peripheral opacity at the right apex that is chronic based on 2017 chest CT and likely from radiation fibrosis. Normal heart size. Stable mediastinal  contours. Mitral annular calcification. Postoperative right axilla and breast. No visible rib fracture. IMPRESSION: 1. No visible rib fracture or intrathoracic injury. 2. Cholelithiasis. Electronically Signed   By: Monte Fantasia M.D.   On: 05/30/2017 12:41   Ct Head Wo Contrast  Result Date: 05/30/2017 CLINICAL DATA:  Ground level fall last night. EXAM: CT HEAD WITHOUT CONTRAST CT CERVICAL SPINE WITHOUT CONTRAST TECHNIQUE: Multidetector CT imaging of the head and cervical spine was performed following the standard protocol without intravenous contrast. Multiplanar CT image reconstructions of the cervical spine were also generated. COMPARISON:  CT head dated May 27, 2017. FINDINGS: CT HEAD FINDINGS Brain: No evidence of acute infarction, hemorrhage, hydrocephalus, extra-axial collection or mass lesion/mass effect. Apparent hypodensity within the right greater than left occipital lobes. Stable mild atrophy and chronic microvascular ischemic changes. Vascular: Calcified atherosclerosis at the skullbase. No hyperdense vessel. Skull: Normal. Negative for fracture or focal lesion. Sinuses/Orbits: No acute finding. Other: None. CT CERVICAL SPINE FINDINGS Alignment: Trace stepwise anterolisthesis at C3-C4, C4-C5, and C5-C6 due to facet arthropathy. No traumatic malalignment. Skull base and vertebrae: No acute fracture. No primary bone lesion or focal pathologic process. Soft tissues and spinal canal: No prevertebral fluid or swelling. No visible canal hematoma. Disc levels: Mild to moderate disc height loss and uncovertebral hypertrophy from C5-C6 through C7-T1. Moderate to severe  bilateral facet arthropathy throughout the cervical spine. Upper chest: Scarring/fibrosis in the right lung apex, possibly from prior radiation. Other: None. IMPRESSION: 1. No definite acute intracranial abnormality. Apparent hypodensity within the right greater than left occipital lobes is favored artifactual, less likely due to infarct  or posterior reversible encephalopathy syndrome. Correlate for visual symptoms or hypertensive urgency. 2. No acute cervical spine fracture. Mild-to-moderate degenerative disc disease in the lower cervical spine. Moderate to severe facet arthropathy throughout the cervical spine. Electronically Signed   By: Titus Dubin M.D.   On: 05/30/2017 12:35   Ct Cervical Spine Wo Contrast  Result Date: 05/30/2017 CLINICAL DATA:  Ground level fall last night. EXAM: CT HEAD WITHOUT CONTRAST CT CERVICAL SPINE WITHOUT CONTRAST TECHNIQUE: Multidetector CT imaging of the head and cervical spine was performed following the standard protocol without intravenous contrast. Multiplanar CT image reconstructions of the cervical spine were also generated. COMPARISON:  CT head dated May 27, 2017. FINDINGS: CT HEAD FINDINGS Brain: No evidence of acute infarction, hemorrhage, hydrocephalus, extra-axial collection or mass lesion/mass effect. Apparent hypodensity within the right greater than left occipital lobes. Stable mild atrophy and chronic microvascular ischemic changes. Vascular: Calcified atherosclerosis at the skullbase. No hyperdense vessel. Skull: Normal. Negative for fracture or focal lesion. Sinuses/Orbits: No acute finding. Other: None. CT CERVICAL SPINE FINDINGS Alignment: Trace stepwise anterolisthesis at C3-C4, C4-C5, and C5-C6 due to facet arthropathy. No traumatic malalignment. Skull base and vertebrae: No acute fracture. No primary bone lesion or focal pathologic process. Soft tissues and spinal canal: No prevertebral fluid or swelling. No visible canal hematoma. Disc levels: Mild to moderate disc height loss and uncovertebral hypertrophy from C5-C6 through C7-T1. Moderate to severe bilateral facet arthropathy throughout the cervical spine. Upper chest: Scarring/fibrosis in the right lung apex, possibly from prior radiation. Other: None. IMPRESSION: 1. No definite acute intracranial abnormality. Apparent hypodensity  within the right greater than left occipital lobes is favored artifactual, less likely due to infarct or posterior reversible encephalopathy syndrome. Correlate for visual symptoms or hypertensive urgency. 2. No acute cervical spine fracture. Mild-to-moderate degenerative disc disease in the lower cervical spine. Moderate to severe facet arthropathy throughout the cervical spine. Electronically Signed   By: Titus Dubin M.D.   On: 05/30/2017 12:35   Mr Brain Wo Contrast  Result Date: 05/30/2017 CLINICAL DATA:  Blurry vision for a few days. Multiple recent falls. EXAM: MRI HEAD WITHOUT CONTRAST TECHNIQUE: Multiplanar, multiecho pulse sequences of the brain and surrounding structures were obtained without intravenous contrast. COMPARISON:  Head CT 05/30/2017 and MRI 05/27/2017 FINDINGS: Brain: There are patchy acute infarcts in both occipital lobes which are new from the recent prior MRI and correlate with the hypodensities on today's earlier CT. No intracranial hemorrhage, mass, midline shift, or extra-axial fluid collection is identified. Elsewhere, T2 hyperintensities in the cerebral white matter bilaterally are unchanged from the prior MRI and are nonspecific but compatible with mild chronic small vessel ischemic disease. Generalized cerebral atrophy is not greater than expected for age. Vascular: Major intracranial vascular flow voids are preserved. Skull and upper cervical spine: Unremarkable bone marrow signal. Sinuses/Orbits: Unremarkable orbits. Paranasal sinuses and mastoid air cells are clear. Other: None. IMPRESSION: 1. Acute bilateral occipital lobe infarcts. 2. Mild chronic small vessel ischemic disease. Electronically Signed   By: Logan Bores M.D.   On: 05/30/2017 16:02    EKG: Independently reviewed.  Assessment/Plan Principal Problem:   Occipital infarction [facility]) Active Problems:   Breast cancer of upper-outer quadrant of right female  breast ([place])   Pulmonary embolism (HCC)    Accelerated hypertension   Chronic diastolic congestive heart failure (HCC)   Chronic kidney disease (CKD), stage III (moderate) (HCC)   High cholesterol   DM (diabetes mellitus) ([place])   Hypertension   Gout    Bilateral occipital infarct with subsequent bilateral blurred vision, confirmed by brain MRI  likely cardioembolic  Not a TPA candidate as  last known normal was the night prior to hospitalization.  Risk factors include age, obesity, CHF, CKD, diabetes, history of PE and DVT, and breast cancer, hypertension, hyperlipidemia admit to Tele / Inpatient Stroke order set  Allow permissive HTN Repeat 2 D echo in view of suspicion for cardioembolic etiology  PT/OT/SLP  lipid panel A1C Aspirin  Lipitor 40 mg q d  Bilateral carotid ultrasound pending TEE is to be performed at some point, as per neurology, possible loop recorder Neuro  Following, appreciate their involvement  Frequent falls, most recently last night, falling on her right chest, tender however with negative x-ray for fracture PT-OT Pain medications.   Hypertension BP  154/51   Pulse 71    Controlled Hold home anti-hypertensive medications  Add Hydralazine Q6 hours as needed for BP 210/110   Hyperlipidemia Lipitor 40 mg daily   Gout, no acute flare Continue Allopurinol   History of  R breast cancer, s/p radiation, on Arimidex Continue Arimidex  Chronic kidney disease stage 3-4  Cr 1.68 at baseline  Lab Results  Component Value Date   CREATININE 1.68 (H) 05/28/2017   CREATININE 1.85 (H) 05/26/2017   CREATININE 2.2 (H) 01/30/2017  IVF Repeat BMET in am  Hold NSAIDS       DVT prophylaxis:  Heparin sq Code Status:    FUll  Family Communication:  Discussed with patient and family Disposition Plan: Expect patient to be discharged to home after condition improves Consults called:     Neuro per EDP Admission status: TEle Inpatient   Sharene Butters, PA-C [facility]   Amion text   [id number]   05/30/2017, 5:37 PM

## 2017-05-30 NOTE — ED Notes (Signed)
Attempted report x1. 

## 2017-05-30 NOTE — ED Notes (Signed)
Attempted report x 2 

## 2017-05-30 NOTE — ED Notes (Signed)
Contact Information Garald Balding [phone] or Eloisa Northern [phone]

## 2017-05-30 NOTE — Progress Notes (Addendum)
[facility] ED CSW covering for [facility] was alerted by CM at [facility] ED that pt had returned to the [facility] ED, after D/C'ing from [facility] on 3/27.    While pt was at [facility] on the medical floor the pt's husband Kalin Amrhein was at the [facility] ED and D/C'd simultaneously with his wife and went home with [employer] [facility] (Private Pay Aide) "a couple hours a day".  Thr [facility] CM team set both pt and pt's husband Mercades Bajaj up with [facility] services.   Pt and pt's husband [facility]'s social worker is scheduled to assess the pt's husband in the home on Tues 06/03/17.   CSW will continue to follow to assess pt's husband's safety in the home as the pt's husband has, per chart, a history of seizures and dementia and pt's husband, had a seizure prior to his [facility] ED admission.   Pt's husband's seizures could have been caused by his inability to remember to take meds for his seizures.  Per notes on 3/27, pt's wife usually gives pt his seizure meds.  7:41 PM CSW spoke to pt who corroborated the above information. CSW spoke to pt's sister who handed the phone to the pt's niece.  Pt's niece is the daughter of the pt's sister Eloisa Northern at ph: [phone].   Pt's niece stated a [employer] [place] (Private Pay Aide) worker would arrive to the pt's home tonight to dispense the pt's husband's epilepsy seizure medication to him, but that the niece had no plan as to who would care for the pt's husband over the weekend. Per pt's niece [employer] [place] (Private [employer]) stated [facility] would have to initiate services in order for [employer] [facility] (Private [employer] Aide) to be able to continue caring for the pt's husband and dispense medications.  CSW asked pt's niece if she wanted the CSW to call APS for assistance for the family and pt's niece stated yes.  CSW's justification for calling APS: Per notes, pt and pt's husband D/C'd on 3/27 under the  supervision of the pt's sister and even though family is aware, pt had a fall and no one was available to pick the pt up until EMD arrived and that the likelihood that the pt's husband is alone and might repeat having a seizure due to not having someone dispense medications is reasonable if family is unable to facilitate this.    8:12 PM APS report filed.  CSW will continue to follow for D/C needs.  Alphonse Guild. Rossie Bretado, LCSW, LCAS, CSI Clinical Social Worker Ph: [phone]

## 2017-05-30 NOTE — Consult Note (Addendum)
NEUROHOSPITALISTS - STROKE CONSULTATION NOTE   Requesting Physician: Dr. Benjaman Lobe [facility]: Dr. Karena Addison Reshunda Strider  Admit date: 05/30/2017    Chief Complaint: Double vision  History obtained from:  Patient, patients family and Chart     HPI   Sabrina [name] is an 84 y.o. female Recent hospital admission on 3/25-3/27/2019 for frequent falls, HTN, HLD, Hx of DVT/PE - previously on Coumadin, Hx Breast Cancer 4 years ago, CHF and CKD who presents to the ED today with complaints of CP after ground level fall last night and now blurry vision.  MRI is positive for acute bilateral occipital infarcts. Neurology has been consulted for Acute stroke.  The patient states she has had blurry vision for the past few days. She describes it as "double vision". She had her glasses checked yesterday and made another appointment to see her eye doctor on Monday April 1st. Last night she tripped on her walker and came to the ED today because she continues to have chest pain from hitting the walker. She states she is compliant with her ASA 81 mg and Lipitor 10 mg regime and takes them daily. She denies any new weakness or numbness. Denies any H/A or difficulty swallowing.  Date last known well: Unable to determine Time last known well: Unable to determine tPA Given: No: outside the window  Modified Rankin: Rankin Score=4 NIH Stroke Scale: 0  Past Medical History    Past Medical History:  Diagnosis Date  . Arthritis    neck  . Breast cancer ([place]) 03/2013   right  . Chronic kidney disease (CKD), stage III (moderate) (H[facility])    nephrologist, Dr. [name]  . Dental crowns present   . Heart murmur    no known problems; states did not know she had murmur until age 25  . High cholesterol   . Hypertension    fluctuates, especially when stressed; has been on med. > 20 yr.  . Immature cataract   . Non-insulin dependent  type 2 diabetes mellitus ([place])   . Radiation 09/06/13-10/20/13   Right Breast Cancer  . Wears partial dentures    lower   Past Surgical History   Past Surgical History:  Procedure Laterality Date  . AXILLARY LYMPH NODE DISSECTION Right 03/30/2013   Procedure: AXILLARY LYMPH NODE DISSECTION;  Surgeon: Rolm Bookbinder, MD;  Location: [place];  Service: General;  Laterality: Right;  . BREAST LUMPECTOMY WITH NEEDLE LOCALIZATION Right 03/30/2013   Procedure: BREAST LUMPECTOMY WITH NEEDLE LOCALIZATION;  Surgeon: Rolm Bookbinder, MD;  Location: [place];  Service: General;  Laterality: Right;  . BREAST SURGERY Right 11/1958   right breast biopsy benign  . DILATION AND CURETTAGE OF UTERUS    . PORTACATH PLACEMENT N/A 04/15/2013   Procedure: INSERTION PORT-A-CATH;  Surgeon: Rolm Bookbinder, MD;  Location: [place];  Service: General;  Laterality: N/A;  . RE-EXCISION OF BREAST CANCER,SUPERIOR MARGINS Right 04/15/2013   Procedure: RE-EXCISION OF RIGHT BREAST  MARGINS;  Surgeon: Rolm Bookbinder, MD;  Location: [facility] [facility];  Service: General;  Laterality: Right;  . TONSILLECTOMY     as a child   Family History   Family History  Problem Relation Age of Onset  . Pneumonia Mother   . Heart attack Father   . Breast cancer Other 109       niece  . Breast cancer Other 42       niece    Social History   reports that she has  never smoked. She has never used smokeless tobacco. She reports that she drinks alcohol. She reports that she does not use drugs.  Allergies  No Known Allergies  Medications Prior to Admission   No current facility-administered medications on file prior to encounter.    Current Outpatient Medications on File Prior to Encounter  Medication Sig Dispense Refill  . allopurinol (ZYLOPRIM) 100 MG tablet Take 100 mg by mouth daily.     [name] anastrozole (ARIMIDEX) 1 MG tablet Take 1 tablet (1 mg total) by mouth daily. 90 tablet 12  . aspirin EC 81 MG  tablet Take 1 tablet (81 mg total) by mouth daily.    [name] atorvastatin (LIPITOR) 10 MG tablet Take 10 mg by mouth every evening.     [name] BIOTIN 5000 PO Take 1 tablet by mouth daily.    . cholecalciferol (VITAMIN D) 1000 UNITS tablet Take 1,000 Units by mouth daily.    . cloNIDine (CATAPRES) 0.2 MG tablet Take 0.2 mg by mouth 2 (two) times daily.    . colchicine 0.6 MG tablet Take 0.6 mg by mouth as needed (GOUT).     [name] docusate sodium (COLACE) 100 MG capsule Take 100 mg by mouth 2 (two) times daily as needed for mild constipation. Reported on 03/21/2015    . furosemide (LASIX) 40 MG tablet TAKE 1 TABLET IN AM AND 1/2 TABLET IN THE PM. (Patient taking differently: TAKE 40 mg  TABLET EVERY OTHER DAY) 60 tablet 0  . gabapentin (NEURONTIN) 100 MG capsule Take 200 mg by mouth 3 (three) times daily.   0  . glipiZIDE (GLUCOTROL) 2.5 mg TABS tablet Take 0.5 tablets (2.5 mg total) by mouth 2 (two) times daily before a meal. (Patient taking differently: Take 2.5 mg by mouth See admin instructions. Take 2.5 in the morning and 1.25) 60 tablet 1  . IRON PO Take 65 mg by mouth daily.    [name] losartan (COZAAR) 25 MG tablet Take 25 mg by mouth daily.     . traMADol (ULTRAM) 50 MG tablet Take 1 tablet (50 mg total) by mouth 2 (two) times daily. (Patient taking differently: Take 50 mg by mouth See admin instructions. Take 50 mg in the morning and take 50 mg  as needed in the evening) 60 tablet 1  . verapamil (VERELAN PM) 240 MG 24 hr capsule Take 240 mg by mouth daily.     . vitamin C (ASCORBIC ACID) 500 MG tablet Take 500 mg by mouth daily.     ROS  History obtained from the patient General ROS: negative for - chills, fatigue, fever, night sweats, weight gain or weight loss Psychological ROS: negative for - behavioral disorder, hallucinations, memory difficulties, mood swings or suicidal ideation Ophthalmic ROS: positive for - blurry vision, double vision ENT ROS: negative for - epistaxis, nasal discharge, oral lesions,  sore throat, tinnitus or vertigo Allergy and Immunology ROS: negative for - hives or itchy/watery eyes Hematological and Lymphatic ROS: negative for - bleeding problems, bruising or swollen lymph nodes Endocrine ROS: negative for - galactorrhea, hair pattern changes, polydipsia/polyuria or temperature intolerance Respiratory ROS: negative for - cough, hemoptysis, shortness of breath or wheezing Cardiovascular ROS: positive for - chest wall pain - where she hit the walker last night Gastrointestinal ROS: negative for - abdominal pain, diarrhea, hematemesis, nausea/vomiting or stool incontinence Genito-Urinary ROS: negative for - dysuria, hematuria, incontinence or urinary frequency/urgency Musculoskeletal ROS: positive for - bilateral hip joint pain Neurological ROS: as noted in HPI Dermatological  ROS: negative for rash and skin lesion changes  Physical Examination   Vitals:   05/30/17 1300 05/30/17 1326 05/30/17 1345 05/30/17 1611  BP: (!) 102/41 (!) 120/44 (!) 118/46 (!) 154/51  Pulse: (!) 56 61 65 71  Resp: 13 (!) 21 (!) 21   Temp:      TempSrc:      SpO2: 97% 97% 98% 99%   HEENT-  Normocephalic,  Cardiovascular - Regular rate and rhythm  Respiratory - Lungs clear bilaterally. Non-labored breathing, No wheezing. Abdomen - soft and non-tender, BS normal Extremities- no edema or cyanosis Skin-warm and dry  Neurological Examination  Mental Status: Alert, oriented, thought content appropriate.  Speech fluent without evidence of aphasia or neglect.  Able to follow 3 step commands without difficulty. Cranial Nerves: II: Visual Fields are full. Pupils are equal, round, and reactive to light.   III,IV, VI: EOMI without ptosis. + diploplia. No nystagmus.   V: Facial sensation is symmetric to temperature VII: Facial movement is symmetric.  VIII: hearing is intact to voice X: Uvula elevates symmetrically XI: Shoulder shrug is symmetric. XII: tongue is midline without atrophy or  fasciculations.  Motor: Tone is normal. Bulk is normal. 5/5 strength in UE's bilaterally. LE's 4+/5 - likely weaker at baseline. Uses a walker at home with recent history of frequent falling. Some LE movement limited due to hip pain  Sensor: Sensation is symmetric to light touch and temperature in the arms and legs. + action tremor in hands Deep Tendon Reflexes: 2+ and symmetric throughout in the biceps and 1+ patellae Plantars: Toes are downgoing bilaterally.  Cerebellar:  finger-to-nose and heel-to-shin test- mostly normal. Limited by blurred vision and hip pain Gait: not tested. No truncal ataxia  Laboratory Results  CBC: Recent Labs  Lab 05/26/17 2332 05/28/17 0001  WBC 11.1* 15.3*  HGB 12.2 12.7  HCT 36.9 39.6  MCV 97.1 99.2  PLT 215 093   Basic Metabolic Panel: Recent Labs  Lab 05/26/17 2332 05/28/17 0001  NA 142 144  K 4.2 4.0  CL 104 106  CO2 26 25  GLUCOSE 195* 231*  BUN 62* 42*  CREATININE 1.85* 1.68*  CALCIUM 10.0 9.5   Liver Function Tests: No results for input(s): LIPASE, AMYLASE in the last 168 hours. No results for input(s): AMMONIA in the last 168 hours. Thyroid Function Studies: No results for input(s): TSH, T4TOTAL, T3FREE, THYROIDAB in the last 72 hours. Cardiac Enzymes: Recent Labs  Lab 05/26/17 2332 05/27/17 0331 05/27/17 1340 05/27/17 1809 05/28/17 0001  TROPONINI 0.09* 0.09* 0.15* 0.14* 0.15*   Coagulation Studies: No results for input(s): APTT, INR in the last 72 hours. No results for input(s): DDIMER in the last 72 hours. [place] Work -Up No results for input(s): VITAMINB12, FOLATE, IRON, RETICCTPCT in the last 72 hours. Microbiology: @MICRORSLT2 @ Urinalysis:  Recent Labs  Lab 05/27/17 0826  COLORURINE YELLOW  APPEARANCEUR CLEAR  LABSPEC 1.010  PHURINE 5.0  GLUCOSEU 50*  HGBUR SMALL*  BILIRUBINUR NEGATIVE  KETONESUR 5*  PROTEINUR 30*  NITRITE NEGATIVE  LEUKOCYTESUR NEGATIVE   Urine Drug Screen:  No results found for:  LABOPIA, COCAINSCRNUR, LABBENZ, AMPHETMU, THCU, LABBARB  Alcohol Level: No results for input(s): ETH in the last 168 hours.  Imaging Results  Dg Ribs Unilateral W/chest Right  Result Date: 05/30/2017 CLINICAL DATA:  Fall landing on walker.  Right rib pain. EXAM: RIGHT RIBS AND CHEST - 3+ VIEW COMPARISON:  Chest x-ray from 3 days ago FINDINGS: Artifact from EKG leads. Cholelithiasis.  There is no edema, consolidation, effusion, or pneumothorax. Hazy peripheral opacity at the right apex that is chronic based on 2017 chest CT and likely from radiation fibrosis. Normal heart size. Stable mediastinal contours. Mitral annular calcification. Postoperative right axilla and breast. No visible rib fracture. IMPRESSION: 1. No visible rib fracture or intrathoracic injury. 2. Cholelithiasis. Electronically Signed   By: Monte Fantasia M.D.   On: 05/30/2017 12:41   Ct Head Wo Contrast  Result Date: 05/30/2017 CLINICAL DATA:  Ground level fall last night. EXAM: CT HEAD WITHOUT CONTRAST CT CERVICAL SPINE WITHOUT CONTRAST TECHNIQUE: Multidetector CT imaging of the head and cervical spine was performed following the standard protocol without intravenous contrast. Multiplanar CT image reconstructions of the cervical spine were also generated. COMPARISON:  CT head dated May 27, 2017. FINDINGS: CT HEAD FINDINGS Brain: No evidence of acute infarction, hemorrhage, hydrocephalus, extra-axial collection or mass lesion/mass effect. Apparent hypodensity within the right greater than left occipital lobes. Stable mild atrophy and chronic microvascular ischemic changes. Vascular: Calcified atherosclerosis at the skullbase. No hyperdense vessel. Skull: Normal. Negative for fracture or focal lesion. Sinuses/Orbits: No acute finding. Other: None. CT CERVICAL SPINE FINDINGS Alignment: Trace stepwise anterolisthesis at C3-C4, C4-C5, and C5-C6 due to facet arthropathy. No traumatic malalignment. Skull base and vertebrae: No acute fracture.  No primary bone lesion or focal pathologic process. Soft tissues and spinal canal: No prevertebral fluid or swelling. No visible canal hematoma. Disc levels: Mild to moderate disc height loss and uncovertebral hypertrophy from C5-C6 through C7-T1. Moderate to severe bilateral facet arthropathy throughout the cervical spine. Upper chest: Scarring/fibrosis in the right lung apex, possibly from prior radiation. Other: None. IMPRESSION: 1. No definite acute intracranial abnormality. Apparent hypodensity within the right greater than left occipital lobes is favored artifactual, less likely due to infarct or posterior reversible encephalopathy syndrome. Correlate for visual symptoms or hypertensive urgency. 2. No acute cervical spine fracture. Mild-to-moderate degenerative disc disease in the lower cervical spine. Moderate to severe facet arthropathy throughout the cervical spine. Electronically Signed   By: Titus Dubin M.D.   On: 05/30/2017 12:35   Ct Cervical Spine Wo Contrast  Result Date: 05/30/2017 CLINICAL DATA:  Ground level fall last night. EXAM: CT HEAD WITHOUT CONTRAST CT CERVICAL SPINE WITHOUT CONTRAST TECHNIQUE: Multidetector CT imaging of the head and cervical spine was performed following the standard protocol without intravenous contrast. Multiplanar CT image reconstructions of the cervical spine were also generated. COMPARISON:  CT head dated May 27, 2017. FINDINGS: CT HEAD FINDINGS Brain: No evidence of acute infarction, hemorrhage, hydrocephalus, extra-axial collection or mass lesion/mass effect. Apparent hypodensity within the right greater than left occipital lobes. Stable mild atrophy and chronic microvascular ischemic changes. Vascular: Calcified atherosclerosis at the skullbase. No hyperdense vessel. Skull: Normal. Negative for fracture or focal lesion. Sinuses/Orbits: No acute finding. Other: None. CT CERVICAL SPINE FINDINGS Alignment: Trace stepwise anterolisthesis at C3-C4, C4-C5, and  C5-C6 due to facet arthropathy. No traumatic malalignment. Skull base and vertebrae: No acute fracture. No primary bone lesion or focal pathologic process. Soft tissues and spinal canal: No prevertebral fluid or swelling. No visible canal hematoma. Disc levels: Mild to moderate disc height loss and uncovertebral hypertrophy from C5-C6 through C7-T1. Moderate to severe bilateral facet arthropathy throughout the cervical spine. Upper chest: Scarring/fibrosis in the right lung apex, possibly from prior radiation. Other: None. IMPRESSION: 1. No definite acute intracranial abnormality. Apparent hypodensity within the right greater than left occipital lobes is favored artifactual, less likely due to infarct  or posterior reversible encephalopathy syndrome. Correlate for visual symptoms or hypertensive urgency. 2. No acute cervical spine fracture. Mild-to-moderate degenerative disc disease in the lower cervical spine. Moderate to severe facet arthropathy throughout the cervical spine. Electronically Signed   By: Titus Dubin M.D.   On: 05/30/2017 12:35   Mr Brain Wo Contrast  Result Date: 05/30/2017 CLINICAL DATA:  Blurry vision for a few days. Multiple recent falls. EXAM: MRI HEAD WITHOUT CONTRAST TECHNIQUE: Multiplanar, multiecho pulse sequences of the brain and surrounding structures were obtained without intravenous contrast. COMPARISON:  Head CT 05/30/2017 and MRI 05/27/2017 FINDINGS: Brain: There are patchy acute infarcts in both occipital lobes which are new from the recent prior MRI and correlate with the hypodensities on today's earlier CT. No intracranial hemorrhage, mass, midline shift, or extra-axial fluid collection is identified. Elsewhere, T2 hyperintensities in the cerebral white matter bilaterally are unchanged from the prior MRI and are nonspecific but compatible with mild chronic small vessel ischemic disease. Generalized cerebral atrophy is not greater than expected for age. Vascular: Major  intracranial vascular flow voids are preserved. Skull and upper cervical spine: Unremarkable bone marrow signal. Sinuses/Orbits: Unremarkable orbits. Paranasal sinuses and mastoid air cells are clear. Other: None. IMPRESSION: 1. Acute bilateral occipital lobe infarcts. 2. Mild chronic small vessel ischemic disease. Electronically Signed   By: Logan Bores M.D.   On: 05/30/2017 16:02   ECHO   05/27/2017 Study Conclusions - Procedure narrative: Transthoracic echocardiography. Image   quality was poor. The study was technically difficult, as a   result of poor sound wave transmission. - Left ventricle: The cavity size was normal. Wall thickness was   increased in a pattern of mild LVH. There was mild concentric   hypertrophy. Systolic function was normal. The estimated ejection   fraction was in the range of 55% to 60%. Wall motion was normal;   there were no regional wall motion abnormalities. Doppler   parameters are consistent with abnormal left ventricular   relaxation (grade 1 diastolic dysfunction). - Aortic valve: There was mild stenosis. Valve area (VTI): 1.75   cm^2. Valve area (Vmax): 1.8 cm^2. Valve area (Vmean): 1.7 cm^2. - Mitral valve: Severely calcified annulus. Valve area by   continuity equation (using LVOT flow): 1.95 cm^2. - Atrial septum: No defect or patent foramen ovale was identified.  IMPRESSION AND PLAN  Sabrina [name] is a 84 y.o. female with PMH of Recent hospital admission on 3/25-3/27/2019 for frequent falls, HTN, HLD, Hx of DVT/PE - previously on Coumadin, Hx Breast Cancer 4 years ago, CHF and CKD who presents to the ED today with complaints of CP after ground level fall last night and now blurry/double vision.  MRI reveals:    Acute Bilateral Occipital lobe Infarcts  Suspected Etiology: Likely cardioembolic from undiagnosed AFIB vs small vessel Resultant Symptoms: double vision Stroke Risk Factors: hyperlipidemia and hypertension Other Stroke Risk Factors:  Advanced age, Obesity, CHF, CKD, Hx of DVT/PE, Breast Cancer  Outstanding Stroke Work-up Studies:     B/L Carotid U/S:                                                     PENDING TEE /Loop Recorder:  PENDING  PLAN  05/30/2017  Patient will be admitted to the hospitalists and stroke workup will be obtained. Frequent Neurochecks  Telemetry Monitoring NPO until passes Stroke swallow screen Continue Aspirin/ Statin, for now No need for ECHO, last one done on 05/27/2017 B/L Carotid Duplex Lipid Profile PT/OT/SLP Consult PM & Rehab Consult Case Management /MSW Likely need TEE and Loop Recorder Placement Ongoing aggressive stroke risk factor management Patient will be counseled to be compliant with her antithrombotic medications Patient will be counseled on Lifestyle modifications including, Diet, Exercise, and Stress Follow up with [facility] in 6 weeks  MEDICAL ISSUES   R/O AFIB: If A.FIB found on telemetry monitoring, the patient will need AC therapy,  Decision will be made pending imaging and stroke team decision. Likely need TEE and Loop Recorder Placement  HYPERTENSION: Stable, Avoid hypotension and dehydration Permissive hypertension (OK if <220/120) for 24-48 hours post stroke, then gradually normalized in 5-7 days. Long term BP goal normotensive. May slowly restart home B/P medications after 48 hours Home Meds: YES  HYPERLIPIDEMIA: CURRENT LIPID PANEL PENDING No results found for: CHOL, TRIG, HDL, CHOLHDL, VLDL, LDLCALC Home Meds:  NONE No results for input(s): AST, ALT, ALKPHOS, BILITOT, PROT, ALBUMIN in the last 168 hours. LDL  goal < 70 Continued on Lipitor, dose increased to 40 mg daily Continue statin at discharge  DIABETES: Lab Results  Component Value Date   HGBA1C 7.4 (H) 05/27/2017  HgbA1c goal < 7.0 Continue CBG monitoring and SSI to maintain glucose 140-180 mg/dl DM education    OBESITY Obesity, BMI 33.3    Hospital day # 0 VTE prophylaxis: Heparin  Diet - Diet NPO time specified Except for: Sips with Meds, Ice Chips Fall precautions     FAMILY UPDATES: family at bedside  TEAM UPDATES:Hobbs, Layne Benton, MD STATUS:    Discharge Information  Prior Home Stroke Medications: aspirin 81 mg daily  Discharge Stroke Meds:  Please discharge patient on TBD     Disposition:  Therapy Recs:               PENDING  Follow up Appointments  Follow Up:  [name], MD -PCP Follow up in 1-2 weeks      Assessment and plan discussed with with attending physician and they are in agreement.    Mary Sella, ANP-C [facility] 05/30/2017, 5:07 PM  05/30/2017 ATTENDING ASSESSMENT   NEUROHOSPITALIST ADDENDUM Seen and examined the patient today. I have reviewed and agree with history, exam and formulated plan as documented above by PAC/Resident. I agree with recommendations as above.  Will follow.  Karena Addison Hutchinson Isenberg MD [facility] [phone]  If 7pm to 7am, please call on call as listed on AMION.     Please page stroke NP/ PA / MD from 8am -4 pm   You can look them up on [email or web address]  Password [email or web address]

## 2017-05-31 ENCOUNTER — Inpatient Hospital Stay (HOSPITAL_COMMUNITY): Payer: Medicare Other

## 2017-05-31 DIAGNOSIS — I639 Cerebral infarction, unspecified: Secondary | ICD-10-CM

## 2017-05-31 DIAGNOSIS — S20211A Contusion of right front wall of thorax, initial encounter: Secondary | ICD-10-CM

## 2017-05-31 DIAGNOSIS — N179 Acute kidney failure, unspecified: Secondary | ICD-10-CM

## 2017-05-31 LAB — URINALYSIS, ROUTINE W REFLEX MICROSCOPIC
Bilirubin Urine: NEGATIVE
Glucose, UA: NEGATIVE mg/dL
Ketones, ur: NEGATIVE mg/dL
Nitrite: NEGATIVE
Protein, ur: NEGATIVE mg/dL
Specific Gravity, Urine: 1.012 (ref 1.005–1.030)
pH: 6 (ref 5.0–8.0)

## 2017-05-31 LAB — CBC
HCT: 31.4 % — ABNORMAL LOW (ref 36.0–46.0)
Hemoglobin: 10.1 g/dL — ABNORMAL LOW (ref 12.0–15.0)
MCH: 31.7 pg (ref 26.0–34.0)
MCHC: 32.2 g/dL (ref 30.0–36.0)
MCV: 98.4 fL (ref 78.0–100.0)
Platelets: 194 10*3/uL (ref 150–400)
RBC: 3.19 MIL/uL — ABNORMAL LOW (ref 3.87–5.11)
RDW: 14.8 % (ref 11.5–15.5)
WBC: 9.2 10*3/uL (ref 4.0–10.5)

## 2017-05-31 LAB — SODIUM, URINE, RANDOM: Sodium, Ur: 23 mmol/L

## 2017-05-31 LAB — GLUCOSE, CAPILLARY
Glucose-Capillary: 185 mg/dL — ABNORMAL HIGH (ref 65–99)
Glucose-Capillary: 175 mg/dL — ABNORMAL HIGH (ref 65–99)
Glucose-Capillary: 243 mg/dL — ABNORMAL HIGH (ref 65–99)
Glucose-Capillary: 194 mg/dL — ABNORMAL HIGH (ref 65–99)

## 2017-05-31 LAB — COMPREHENSIVE METABOLIC PANEL
ALT: 30 U/L (ref 14–54)
AST: 33 U/L (ref 15–41)
Albumin: 2.9 g/dL — ABNORMAL LOW (ref 3.5–5.0)
Alkaline Phosphatase: 67 U/L (ref 38–126)
Anion gap: 13 (ref 5–15)
BUN: 76 mg/dL — ABNORMAL HIGH (ref 6–20)
CO2: 22 mmol/L (ref 22–32)
Calcium: 8.2 mg/dL — ABNORMAL LOW (ref 8.9–10.3)
Chloride: 101 mmol/L (ref 101–111)
Creatinine, Ser: 3.52 mg/dL — ABNORMAL HIGH (ref 0.44–1.00)
GFR calc Af Amer: 12 mL/min — ABNORMAL LOW (ref >60)
GFR calc non Af Amer: 11 mL/min — ABNORMAL LOW (ref >60)
Glucose, Bld: 178 mg/dL — ABNORMAL HIGH (ref 65–99)
Potassium: 4.1 mmol/L (ref 3.5–5.1)
Sodium: 136 mmol/L (ref 135–145)
Total Bilirubin: 0.9 mg/dL (ref 0.3–1.2)
Total Protein: 5.7 g/dL — ABNORMAL LOW (ref 6.5–8.1)

## 2017-05-31 LAB — CREATININE, URINE, RANDOM: Creatinine, Urine: 86.26 mg/dL

## 2017-05-31 LAB — LIPID PANEL
Cholesterol: 101 mg/dL (ref 0–200)
HDL: 40 mg/dL — ABNORMAL LOW (ref >40)
LDL Cholesterol: 39 mg/dL (ref 0–99)
Total CHOL/HDL Ratio: 2.5 RATIO
Triglycerides: 109 mg/dL (ref <150)
VLDL: 22 mg/dL (ref 0–40)

## 2017-05-31 MED ORDER — LABETALOL HCL 5 MG/ML IV SOLN: 5.0000 mg | INTRAVENOUS | Status: DC | PRN

## 2017-05-31 MED ORDER — SODIUM CHLORIDE 0.9 % IV SOLN
INTRAVENOUS | Status: DC
  Administered 2017-05-31 – 2017-06-02 (×3): via INTRAVENOUS

## 2017-05-31 MED ORDER — SODIUM CHLORIDE 0.9 % IV BOLUS
500.0000 mL | Freq: Once | INTRAVENOUS | Status: AC
  Administered 2017-05-31: 500 mL via INTRAVENOUS

## 2017-05-31 MED ORDER — ONDANSETRON HCL 4 MG/2ML IJ SOLN
4.0000 mg | Freq: Three times a day (TID) | INTRAMUSCULAR | Status: DC | PRN
  Administered 2017-05-31: 4 mg via INTRAVENOUS
  Filled 2017-05-31: qty 2

## 2017-05-31 MED ORDER — BISACODYL 10 MG RE SUPP
10.0000 mg | Freq: Every day | RECTAL | Status: DC | PRN
  Administered 2017-05-31: 10 mg via RECTAL
  Filled 2017-05-31: qty 1

## 2017-05-31 NOTE — Progress Notes (Signed)
STROKE TEAM PROGRESS NOTE   HISTORY OF PRESENT ILLNESS (per record) Sabrina [name] is an 84 y.o. female with a recent hospital admission on 3/25-3/27/2019 for frequent falls, HTN, HLD, Hx of DVT/PE - previously on Coumadin, Hx Breast Cancer 4 years ago, CHF and CKD who presents to the ED today with complaints of CP after ground level fall last night and now blurry vision.  MRI is positive for acute bilateral occipital infarcts. Neurology has been consulted for acute stroke.  The patient states she has had blurry vision for the past few days. She describes it as "double vision". She had her glasses checked yesterday and made another appointment to see her eye doctor on Monday April 1st. Last night she tripped on her walker and came to the ED today because she continues to have chest pain from hitting the walker. She states she is compliant with her ASA 81 mg and Lipitor 10 mg regime and takes them daily. She denies any new weakness or numbness. Denies any H/A or difficulty swallowing.  Date last known well: Unable to determine Time last known well: Unable to determine tPA Given: No: outside the window    SUBJECTIVE (INTERVAL HISTORY) Her family is not at bedside. Dysphagia this morning while eating her eggs. Pending swallow eval. Still having vision changes.    OBJECTIVE Temp:  [98.1 F (36.7 C)-98.7 F (37.1 C)] 98.1 F (36.7 C) (03/30 0405) Pulse Rate:  [56-96] 86 (03/30 0405) Cardiac Rhythm: Sinus tachycardia (03/30 0700) Resp:  [13-22] 17 (03/30 0405) BP: (102-164)/(39-87) 131/50 (03/30 0405) SpO2:  [93 %-99 %] 96 % (03/30 0405) Weight:  [171 lb 1.2 oz (77.6 kg)] 171 lb 1.2 oz (77.6 kg) (03/29 2355)  CBC:  Recent Labs  Lab 05/26/17 2332 05/28/17 0001 05/30/17 1618 05/31/17 0524  WBC 11.1* 15.3*  --  9.2  NEUTROABS 8.1*  --  7.2  --   HGB 12.2 12.7  --  10.1*  HCT 36.9 39.6  --  31.4*  MCV 97.1 99.2  --  98.4  PLT 215 267  --  357    Basic Metabolic Panel:  Recent Labs   Lab 05/28/17 0001 05/31/17 0524  NA 144 136  K 4.0 4.1  CL 106 101  CO2 25 22  GLUCOSE 231* 178*  BUN 42* 76*  CREATININE 1.68* 3.52*  CALCIUM 9.5 8.2*    Lipid Panel:     Component Value Date/Time   CHOL 101 05/31/2017 0524   TRIG 109 05/31/2017 0524   HDL 40 (L) 05/31/2017 0524   CHOLHDL 2.5 05/31/2017 0524   VLDL 22 05/31/2017 0524   LDLCALC 39 05/31/2017 0524   HgbA1c:  Lab Results  Component Value Date   HGBA1C 7.4 (H) 05/27/2017   Urine Drug Screen: No results found for: LABOPIA, COCAINSCRNUR, LABBENZ, AMPHETMU, THCU, LABBARB  Alcohol Level No results found for: [facility]  IMAGING  Dg Ribs Unilateral W/chest Right 05/30/2017 IMPRESSION:  1. No visible rib fracture or intrathoracic injury.  2. Cholelithiasis.    Ct Head Wo Contrast 05/30/2017 IMPRESSION:  1. No definite acute intracranial abnormality. Apparent hypodensity within the right greater than left occipital lobes is favored artifactual, less likely due to infarct or posterior reversible encephalopathy syndrome. Correlate for visual symptoms or hypertensive urgency.    Ct Cervical Spine Wo Contrast 05/30/2017 IMPRESSION:  No acute cervical spine fracture. Mild-to-moderate degenerative disc disease in the lower cervical spine. Moderate to severe facet arthropathy throughout the cervical spine.  Mr Brain Wo Contrast 05/30/2017 IMPRESSION:  1. Acute bilateral occipital lobe infarcts.  2. Mild chronic small vessel ischemic disease.    Transthoracic Echocardiogram  05/27/2017 Study Conclusions - Procedure narrative: Transthoracic echocardiography. Image   quality was poor. The study was technically difficult, as a   result of poor sound wave transmission. - Left ventricle: The cavity size was normal. Wall thickness was   increased in a pattern of mild LVH. There was mild concentric   hypertrophy. Systolic function was normal. The estimated ejection   fraction was in the range of 55% to 60%. Wall  motion was normal;   there were no regional wall motion abnormalities. Doppler   parameters are consistent with abnormal left ventricular   relaxation (grade 1 diastolic dysfunction). - Aortic valve: There was mild stenosis. Valve area (VTI): 1.75   cm^2. Valve area (Vmax): 1.8 cm^2. Valve area (Vmean): 1.7 cm^2. - Mitral valve: Severely calcified annulus. Valve area by   continuity equation (using LVOT flow): 1.95 cm^2. - Atrial septum: No defect or patent foramen ovale was identified.   Bilateral Carotid Dopplers  05/31/2017 1-39% ICA stenosis. Vertebral artery flow is antegrade.     PHYSICAL EXAM Vitals:   05/30/17 2230 05/30/17 2259 05/30/17 2355 05/31/17 0405  BP: (!) 144/62  (!) 164/71 (!) 131/50  Pulse: 96  92 86  Resp: (!) 22  17 17   Temp:   98.7 F (37.1 C) 98.1 F (36.7 C)  TempSrc:   Oral Oral  SpO2:    96%  Weight:   171 lb 1.2 oz (77.6 kg)   Height:  4\' 11"  (1.499 m)     Physical exam: Exam: Gen: NAD,                  CV: RRR, no MRG. No Carotid Bruits. No peripheral edema, warm, nontender Eyes: Conjunctivae clear without exudates or hemorrhage  Neuro: Detailed Neurologic Exam  Speech:    Speech is normal; fluent and spontaneous with normal comprehension.  Cognition:    The patient is oriented to person, month and year and follows commands, answers appropriately   Cranial Nerves:    The pupils are equal, round, and reactive to light. The fundi are normal and spontaneous venous pulsations are present. Visual fields are full to finger confrontation. Extraocular movements are intact but reports diplopia. Trigeminal sensation is intact and the muscles of mastication are normal. The face is symmetric. The palate elevates in the midline. Hearing intact. Voice is normal. Shoulder shrug is normal. The tongue has normal motion without fasciculations.   Coordination:    No dysmetria noted  Motor Observation:    No asymmetry Tone:    Normal muscle tone.       Strength:    Strength is symmetrical and anti-gravity bilaterally, difficult exam due to non-cooperation     Sensation: intact to LT     Reflex Exam:  DTR's:    Deep tendon reflexes in the upper and lower extremities are symmetrical bilaterally.   Toes:    The toes are equivocal bilaterally.   Clonus:    Clonus is absent.    ASSESSMENT/PLAN Ms. Manuelita L Estis is a 84 y.o. female with history of frequent falls, DM, CKD, HTN, HLD, Hx of DVT/PE  presenting with . She did not receive IV t-PA due to late presentation.  Stroke:  Acute bilateral occipital lobe infarcts - possibly embolic.  Resultant  diplopia  CT head - no definite acute intracranial abnormality.  MRI head - acute bilateral occipital lobe infarcts.   MRA head - not performed  Carotid Doppler - 1-39% ICA stenosis.  Vertebral artery flow is antegrade.   2D Echo - EF 55 - 60%. Lt [facility] -  no defect or patent foramen ovale was identified.  Needs TEE and loop  LDL - 39  HgbA1c - 7.4  VTE prophylaxis - [place] heparin Fall precautions Diet NPO time specified Except for: Ice Chips  aspirin 81 mg daily prior to admission, now on aspirin 325 mg daily  Patient counseled to be compliant with her antithrombotic medications  Ongoing aggressive stroke risk factor management  Therapy recommendations:  pending  Disposition:  Pending  Hypertension  Stable  Permissive hypertension (OK if < 220/120) but gradually normalize in 5-7 days.  Long-term BP goal normotensive  Hyperlipidemia  Lipid lowering medication PTA:  Lipitor 10 mg daily  LDL 39, goal < 70  Current lipid lowering medication: Lipitor 40 mg daily  Continue statin at discharge  Diabetes  HgbA1c 7.4, goal < 7.0  Uncontrolled  Other Stroke Risk Factors  Advanced age  ETOH use, advised to drink no more than 1 drink per day.  Obesity, Body mass index is 34.55 kg/m., recommend weight loss, diet and exercise as appropriate    Other Active  Problems  Anemia  CKD   Plan / Recommendations   Stroke workup  TEE and loop (ordered)   Hospital day # 1  Personally examined patient and images, and have participated in and made any corrections needed to history, physical, neuro exam,assessment and plan as stated above.  I have personally obtained the history, evaluated lab date, reviewed imaging studies and agree with radiology interpretations.    Sarina Ill, MD Stroke Neurology  To contact Stroke Continuity provider, please refer to [email or web address] After hours, contact General Neurology

## 2017-05-31 NOTE — Progress Notes (Signed)
PT Cancellation Note  Patient Details Name: Sabrina [name] MRN: [id number] DOB: 11-Jul-1926   Cancelled Treatment:    Reason Eval/Treat Not Completed: Active bedrest order pt on active bedrest. Will await increase in activity orders prior to PT evaluation.   Sabrina [name] 05/31/2017, 7:45 AM Wray Kearns, Gordon, DPT [phone]

## 2017-05-31 NOTE — Progress Notes (Signed)
Patient had coughing while eating her breakfast; throat was cleared; diet removed and speech orders for swallow eval. Placed; then patient proceeded to vomit; prn for nausea given; patient reports no bm in greater then 4 days; senna was given prn last night.

## 2017-05-31 NOTE — Progress Notes (Signed)
VASCULAR LAB PRELIMINARY  PRELIMINARY  PRELIMINARY  PRELIMINARY  Carotid duplex completed.    Preliminary report:  1-39% ICA stenosis.  Vertebral artery flow is antegrade.   Madisun Hargrove, RVT 05/31/2017, 11:15 AM

## 2017-05-31 NOTE — Progress Notes (Signed)
SLP Cancellation Note  Patient Details Name: Sabrina [name] MRN: [id number] DOB: 1926-08-02   Cancelled treatment:       Reason Eval/Treat Not Completed: Patient at procedure or test/unavailable. Will f/u as able.   [name] 05/31/2017, 10:54 AM  [name], M.A. CCC-SLP [phone]

## 2017-05-31 NOTE — Progress Notes (Signed)
PROGRESS NOTE    Sabrina [name]  XBM:[id number] DOB: [birth year] DOA: 05/30/2017 PCP: [name], MD      Brief Narrative:  [name] is a 85 y.o. F with CKD stage IV, HTN, BrCA, and DM who presents with falls and new onset blindness.  The patient blurry vision several days ago, which she describes as "double vision".  She had her glasses checked the day before admission and made another appointment to see her eye doctor on Monday April 1st but the night before admission she tripped on her walker and came to the ED because of chest pain from hitting the walker.      Assessment & Plan:  Stroke, bilateral occipital, likely embolic U2V 2.5%, LDL 39.  MRI shows "patchy acute infarcts in both occipital lobes" new from MRI 4 days ago.  Hemoglobin A1c near goal, LDL 39.  MRI shows bilateral occipital lobe infarcts. -Continue aspirin 325 -Continue statin increased dose -Restart blood pressure medicines tomorrow -Continue telemetry -Permissive HTN, unless >220/120 -PT and OT and SLP ordered    Acute kidney injury on chronic kidney disease stage IV Baseline 1.7, today up to 3.5.  Has been taking ibuprofen recently for leg pain. -Check urinalysis -Check Fena -Gentle IV fluids -Trend BMP  Chest pain This is from falling on her walker.  CXR and dedicated rib films negative.  Hypertension -Hold home Cozaar, verapamil, clonidine, furosemide -Labetalol PRN for severe BP elevation, otherwise permissive HTN for now  History breast cancer  -Continue Arimidex  History gout Quiescent -Continue allopurinol  Diabetes HgbA1c 7.4% -Continue SSI    DVT prophylaxis: Lovenox Code Status: FULL Family Communication: None present MDM and disposition Plan: The below labs and imaging reports were reviewed.  The patient's status is clinically stable.  She presents with      Consultants:   Neurology  Procedures:   CT head and cspine 3/29 IMPRESSION: 1. No definite acute  intracranial abnormality. Apparent hypodensity within the right greater than left occipital lobes is favored artifactual, less likely due to infarct or posterior reversible encephalopathy syndrome. Correlate for visual symptoms or hypertensive urgency. 2. No acute cervical spine fracture. Mild-to-moderate degenerative disc disease in the lower cervical spine. Moderate to severe facet arthropathy throughout the cervical spine.   Rib xrays 3/29 IMPRESSION: 1. No visible rib fracture or intrathoracic injury. 2. Cholelithiasis.    MRI brain 3/29 IMPRESSION: 1. Acute bilateral occipital lobe infarcts. 2. Mild chronic small vessel ischemic disease     Antimicrobials:   None    Subjective: Feels still blind.  Can't see me.  No focal weakness, slurred speech, dysmetria, confusion, aphasia.  No fever, cough, sputum.  Rib pain is improved.  Objective: Vitals:   05/30/17 2355 05/31/17 0405 05/31/17 1229 05/31/17 1541  BP: (!) 164/71 (!) 131/50 (!) 195/72 (!) 201/67  Pulse: 92 86 (!) 112 (!) 109  Resp: 17 17 17 17   Temp: 98.7 F (37.1 C) 98.1 F (36.7 C) 98.5 F (36.9 C) 98.7 F (37.1 C)  TempSrc: Oral Oral Oral Oral  SpO2:  96% 97% 95%  Weight: 77.6 kg (171 lb 1.2 oz)     Height:        Intake/Output Summary (Last 24 hours) at 05/31/2017 1650 Last data filed at 05/31/2017 0600 Gross per 24 hour  Intake 150 ml  Output 180 ml  Net -30 ml   Filed Weights   05/30/17 2355  Weight: 77.6 kg (171 lb 1.2 oz)    Examination: General  appearance: Eldelry adult female, alert and in no acute distress.   HEENT: Anicteric, conjunctiva pink, lids and lashes normal. Vision impaire.d  Hearing normal.  No nasal deformity, discharge, epistaxis.  Lips moist, teeth normal.   Skin: Warm and dry.  No jaundice.  No suspicious rashes or lesions. Cardiac: RRR, nl S1-S2, no murmurs appreciated.   No LE edema.  Radial pulses 2+ and symmetric. Respiratory: Normal respiratory rate and rhythm.   CTAB without rales or wheezes. Abdomen: Abdomen soft.  No TTP. No ascites, distension, hepatosplenomegaly.   MSK: No deformities or effusions. Neuro: Awake and alert.  EOMI, moves all extremities. Speech fluent.    Psych: Sensorium intact and responding to questions, attention normal. Affect normal.  Judgment and insight appear normal.    Data Reviewed: I have personally reviewed following labs and imaging studies:  CBC: Recent Labs  Lab 05/26/17 2332 05/28/17 0001 05/30/17 1618 05/31/17 0524  WBC 11.1* 15.3*  --  9.2  NEUTROABS 8.1*  --  7.2  --   HGB 12.2 12.7  --  10.1*  HCT 36.9 39.6  --  31.4*  MCV 97.1 99.2  --  98.4  PLT 215 267  --  333   Basic Metabolic Panel: Recent Labs  Lab 05/26/17 2332 05/28/17 0001 05/31/17 0524  NA 142 144 136  K 4.2 4.0 4.1  CL 104 106 101  CO2 26 25 22   GLUCOSE 195* 231* 178*  BUN 62* 42* 76*  CREATININE 1.85* 1.68* 3.52*  CALCIUM 10.0 9.5 8.2*   GFR: Estimated Creatinine Clearance: 9.9 mL/min (A) (by C-G formula based on SCr of 3.52 mg/dL (H)). Liver Function Tests: Recent Labs  Lab 05/31/17 0524  AST 33  ALT 30  ALKPHOS 67  BILITOT 0.9  PROT 5.7*  ALBUMIN 2.9*   No results for input(s): LIPASE, AMYLASE in the last 168 hours. No results for input(s): AMMONIA in the last 168 hours. Coagulation Profile: No results for input(s): INR, PROTIME in the last 168 hours. Cardiac Enzymes: Recent Labs  Lab 05/26/17 2332 05/27/17 0331 05/27/17 1340 05/27/17 1809 05/28/17 0001  TROPONINI 0.09* 0.09* 0.15* 0.14* 0.15*   BNP (last 3 results) No results for input(s): PROBNP in the last 8760 hours. HbA1C: No results for input(s): HGBA1C in the last 72 hours. CBG: Recent Labs  Lab 05/28/17 1154 05/28/17 1640 05/30/17 2313 05/31/17 0554 05/31/17 1201  GLUCAP 207* 95 215* 185* 175*   Lipid Profile: Recent Labs    05/31/17 0524  CHOL 101  HDL 40*  LDLCALC 39  TRIG 109  CHOLHDL 2.5   Thyroid Function Tests: No  results for input(s): TSH, T4TOTAL, FREET4, T3FREE, THYROIDAB in the last 72 hours. Anemia Panel: No results for input(s): VITAMINB12, FOLATE, FERRITIN, TIBC, IRON, RETICCTPCT in the last 72 hours. Urine analysis:    Component Value Date/Time   COLORURINE YELLOW 05/31/2017 0905   APPEARANCEUR CLEAR 05/31/2017 0905   LABSPEC 1.012 05/31/2017 0905   PHURINE 6.0 05/31/2017 0905   GLUCOSEU NEGATIVE 05/31/2017 0905   HGBUR SMALL (A) 05/31/2017 0905   BILIRUBINUR NEGATIVE 05/31/2017 0905   KETONESUR NEGATIVE 05/31/2017 0905   PROTEINUR NEGATIVE 05/31/2017 0905   UROBILINOGEN 0.2 09/13/2013 0523   NITRITE NEGATIVE 05/31/2017 0905   LEUKOCYTESUR LARGE (A) 05/31/2017 0905   Sepsis Labs: @LABRCNTIP (procalcitonin:4,lacticacidven:4)  )No results found for this or any previous visit (from the past 240 hour(s)).       Radiology Studies: Dg Ribs Unilateral W/chest Right  Result Date: 05/30/2017 CLINICAL  DATA:  Fall landing on walker.  Right rib pain. EXAM: RIGHT RIBS AND CHEST - 3+ VIEW COMPARISON:  Chest x-ray from 3 days ago FINDINGS: Artifact from EKG leads. Cholelithiasis. There is no edema, consolidation, effusion, or pneumothorax. Hazy peripheral opacity at the right apex that is chronic based on 2017 chest CT and likely from radiation fibrosis. Normal heart size. Stable mediastinal contours. Mitral annular calcification. Postoperative right axilla and breast. No visible rib fracture. IMPRESSION: 1. No visible rib fracture or intrathoracic injury. 2. Cholelithiasis. Electronically Signed   By: Monte Fantasia M.D.   On: 05/30/2017 12:41   Ct Head Wo Contrast  Result Date: 05/30/2017 CLINICAL DATA:  Ground level fall last night. EXAM: CT HEAD WITHOUT CONTRAST CT CERVICAL SPINE WITHOUT CONTRAST TECHNIQUE: Multidetector CT imaging of the head and cervical spine was performed following the standard protocol without intravenous contrast. Multiplanar CT image reconstructions of the cervical spine  were also generated. COMPARISON:  CT head dated May 27, 2017. FINDINGS: CT HEAD FINDINGS Brain: No evidence of acute infarction, hemorrhage, hydrocephalus, extra-axial collection or mass lesion/mass effect. Apparent hypodensity within the right greater than left occipital lobes. Stable mild atrophy and chronic microvascular ischemic changes. Vascular: Calcified atherosclerosis at the skullbase. No hyperdense vessel. Skull: Normal. Negative for fracture or focal lesion. Sinuses/Orbits: No acute finding. Other: None. CT CERVICAL SPINE FINDINGS Alignment: Trace stepwise anterolisthesis at C3-C4, C4-C5, and C5-C6 due to facet arthropathy. No traumatic malalignment. Skull base and vertebrae: No acute fracture. No primary bone lesion or focal pathologic process. Soft tissues and spinal canal: No prevertebral fluid or swelling. No visible canal hematoma. Disc levels: Mild to moderate disc height loss and uncovertebral hypertrophy from C5-C6 through C7-T1. Moderate to severe bilateral facet arthropathy throughout the cervical spine. Upper chest: Scarring/fibrosis in the right lung apex, possibly from prior radiation. Other: None. IMPRESSION: 1. No definite acute intracranial abnormality. Apparent hypodensity within the right greater than left occipital lobes is favored artifactual, less likely due to infarct or posterior reversible encephalopathy syndrome. Correlate for visual symptoms or hypertensive urgency. 2. No acute cervical spine fracture. Mild-to-moderate degenerative disc disease in the lower cervical spine. Moderate to severe facet arthropathy throughout the cervical spine. Electronically Signed   By: Titus Dubin M.D.   On: 05/30/2017 12:35   Ct Cervical Spine Wo Contrast  Result Date: 05/30/2017 CLINICAL DATA:  Ground level fall last night. EXAM: CT HEAD WITHOUT CONTRAST CT CERVICAL SPINE WITHOUT CONTRAST TECHNIQUE: Multidetector CT imaging of the head and cervical spine was performed following the  standard protocol without intravenous contrast. Multiplanar CT image reconstructions of the cervical spine were also generated. COMPARISON:  CT head dated May 27, 2017. FINDINGS: CT HEAD FINDINGS Brain: No evidence of acute infarction, hemorrhage, hydrocephalus, extra-axial collection or mass lesion/mass effect. Apparent hypodensity within the right greater than left occipital lobes. Stable mild atrophy and chronic microvascular ischemic changes. Vascular: Calcified atherosclerosis at the skullbase. No hyperdense vessel. Skull: Normal. Negative for fracture or focal lesion. Sinuses/Orbits: No acute finding. Other: None. CT CERVICAL SPINE FINDINGS Alignment: Trace stepwise anterolisthesis at C3-C4, C4-C5, and C5-C6 due to facet arthropathy. No traumatic malalignment. Skull base and vertebrae: No acute fracture. No primary bone lesion or focal pathologic process. Soft tissues and spinal canal: No prevertebral fluid or swelling. No visible canal hematoma. Disc levels: Mild to moderate disc height loss and uncovertebral hypertrophy from C5-C6 through C7-T1. Moderate to severe bilateral facet arthropathy throughout the cervical spine. Upper chest: Scarring/fibrosis in the right lung  apex, possibly from prior radiation. Other: None. IMPRESSION: 1. No definite acute intracranial abnormality. Apparent hypodensity within the right greater than left occipital lobes is favored artifactual, less likely due to infarct or posterior reversible encephalopathy syndrome. Correlate for visual symptoms or hypertensive urgency. 2. No acute cervical spine fracture. Mild-to-moderate degenerative disc disease in the lower cervical spine. Moderate to severe facet arthropathy throughout the cervical spine. Electronically Signed   By: Titus Dubin M.D.   On: 05/30/2017 12:35   Mr Brain Wo Contrast  Result Date: 05/30/2017 CLINICAL DATA:  Blurry vision for a few days. Multiple recent falls. EXAM: MRI HEAD WITHOUT CONTRAST TECHNIQUE:  Multiplanar, multiecho pulse sequences of the brain and surrounding structures were obtained without intravenous contrast. COMPARISON:  Head CT 05/30/2017 and MRI 05/27/2017 FINDINGS: Brain: There are patchy acute infarcts in both occipital lobes which are new from the recent prior MRI and correlate with the hypodensities on today's earlier CT. No intracranial hemorrhage, mass, midline shift, or extra-axial fluid collection is identified. Elsewhere, T2 hyperintensities in the cerebral white matter bilaterally are unchanged from the prior MRI and are nonspecific but compatible with mild chronic small vessel ischemic disease. Generalized cerebral atrophy is not greater than expected for age. Vascular: Major intracranial vascular flow voids are preserved. Skull and upper cervical spine: Unremarkable bone marrow signal. Sinuses/Orbits: Unremarkable orbits. Paranasal sinuses and mastoid air cells are clear. Other: None. IMPRESSION: 1. Acute bilateral occipital lobe infarcts. 2. Mild chronic small vessel ischemic disease. Electronically Signed   By: Logan Bores M.D.   On: 05/30/2017 16:02        Scheduled Meds: .  stroke: mapping our early stages of recovery book   Does not apply Once  . allopurinol  100 mg Oral Daily  . anastrozole  1 mg Oral Daily  . aspirin EC  325 mg Oral Daily  . atorvastatin  40 mg Oral QPM  . cholecalciferol  1,000 Units Oral Daily  . heparin  5,000 Units Subcutaneous Q8H  . insulin aspart  0-9 Units Subcutaneous TID WC   Continuous Infusions: . sodium chloride 75 mL/hr at 05/31/17 1109     LOS: 1 day    Time spent: 25 minutes    Edwin Dada, MD [facility] 05/31/2017, 4:50 PM     Pager [phone] --- please page though AMION:  [email or web address] If 7PM-7AM, please contact night-coverage

## 2017-05-31 NOTE — Progress Notes (Signed)
Pt 84 yrs old female from home with spouse Alert and oriented x 4 c/o s/p Fall and stroke. Oriented to  room and nursing staff. Initial assessment completed the patient moves all extremities.,Pupills equal and reactive but noted blurred vision. Cardiac monitor in use.verfification completed and pt made comfortable in bed. Will continue to monitor

## 2017-05-31 NOTE — Evaluation (Signed)
Physical Therapy Evaluation Patient Details Name: [name] MRN: [id number] DOB: December 04, 1926 Today's Date: 05/31/2017   History of Present Illness  Patient is a 84 y/o female who was recently admitted 3/25-3/27 due to multiple falls now presents with another fall at home, CP and blurry vision. Brain MRI-bilateral occipital infarcts. PMH includes DM, breast ca, PE, DVT, rt hip pain, CKD.  Clinical Impression  Patient presents with impaired vision, generalized weakness, impaired balance and impaired mobility s/p above. Tolerated transfers and gait training with Min A for balance/safety. Needs max assist for directional cues due to diplopia, difficulty focusing and difficulty seeing out of inferior part of bil eyes. This is severely impacting pt's balance and ability to mobilize safely. HIGH fall risk. Pt already with multiple falls at home with recent admission due to falls. Lives with spouse who has dementia. Would benefit from CIR to maximize independence and mobility prior to return home. Will follow acutely.     Follow Up Recommendations CIR    Equipment Recommendations  None recommended by PT    Recommendations for Other Services       Precautions / Restrictions Precautions Precautions: Fall Precaution Comments: impaired vision Restrictions Weight Bearing Restrictions: No      Mobility  Bed Mobility Overal bed mobility: Needs Assistance Bed Mobility: Rolling;Sidelying to Sit Rolling: Min guard Sidelying to sit: Min assist;HOB elevated       General bed mobility comments: Increased time and cues for technique as pt with difficulty seeing rail. Feels uneasy sitting EOB.  Transfers Overall transfer level: Needs assistance Equipment used: Rolling walker (2 wheeled);None Transfers: Sit to/from [employer] to Stand: Min assist Stand pivot transfers: Min assist       General transfer comment: Assist to power to standing with cues for hand placement.  SPT bed to/from [facility] with support for balance and directional cues. Stood from [employer].   Ambulation/Gait Ambulation/Gait assistance: Min assist Ambulation Distance (Feet): 12 Feet Assistive device: Rolling walker (2 wheeled) Gait Pattern/deviations: Step-through pattern;Decreased stride length;Trunk flexed Gait velocity: decreased   General Gait Details: Requires Min A for directional cues due to impaired vision; use of RW for support. HR up to 128 bpm.  Stairs            Wheelchair Mobility    Modified Rankin (Stroke Patients Only) Modified Rankin (Stroke Patients Only) Pre-Morbid Rankin Score: Slight disability Modified Rankin: Moderately severe disability     Balance Overall balance assessment: Needs assistance Sitting-balance support: Feet supported;No upper extremity supported Sitting balance-Leahy Scale: Fair     Standing balance support: During functional activity;Bilateral upper extremity supported Standing balance-Leahy Scale: Poor Standing balance comment: Requires BUE support in standing and min A.                              Pertinent Vitals/Pain Pain Assessment: No/denies pain    Home Living Family/patient expects to be discharged to:: Private residence Living Arrangements: Spouse/significant other Available Help at Discharge: Family Type of Home: House Home Access: Stairs to enter Entrance Stairs-Rails: None Entrance Stairs-Number of Steps: 2-back Home Layout: One level Home Equipment: Cane - single point;Walker - 2 wheels      Prior Function Level of Independence: Independent with assistive device(s)         Comments: using walker last few weeks due to R LE pain. at baseline, uses cane. Spouse has dementia. Multiple falls at home  Hand Dominance   Dominant Hand: Right    Extremity/Trunk Assessment   Upper Extremity Assessment Upper Extremity Assessment: Defer to OT evaluation    Lower Extremity Assessment Lower  Extremity Assessment: Generalized weakness    Cervical / Trunk Assessment Cervical / Trunk Assessment: Kyphotic  Communication   Communication: No difficulties  Cognition Arousal/Alertness: Awake/alert Behavior During Therapy: WFL for tasks assessed/performed Overall Cognitive Status: Within Functional Limits for tasks assessed                                        General Comments General comments (skin integrity, edema, etc.): Pt reporting diplopia with moments of seeing triple. Difficulty focusing vision. When occluding 1 eye, still sees double in both eyes, Difficulty seeing inferior field in both eyes    Exercises     Assessment/Plan    PT Assessment Patient needs continued PT services  PT Problem List Decreased strength;Decreased balance;Decreased mobility;Decreased activity tolerance       PT Treatment Interventions DME instruction;Functional mobility training;Therapeutic activities;Gait training;Balance training;Patient/family education;Therapeutic exercise;Neuromuscular re-education;Stair training    PT Goals (Current goals can be found in the Care Plan section)  Acute Rehab PT Goals Patient Stated Goal: to go home to my husband PT Goal Formulation: With patient Time For Goal Achievement: 06/14/17 Potential to Achieve Goals: Fair    Frequency Min 4X/week   Barriers to discharge Decreased caregiver support spouse has dementia    Co-evaluation               AM-PAC PT "6 Clicks" Daily Activity  Outcome Measure Difficulty turning over in bed (including adjusting bedclothes, sheets and blankets)?: None Difficulty moving from lying on back to sitting on the side of the bed? : Unable Difficulty sitting down on and standing up from a chair with arms (e.g., wheelchair, bedside commode, etc,.)?: Unable Help needed moving to and from a bed to chair (including a wheelchair)?: A Little Help needed walking in hospital room?: A Little Help needed  climbing 3-5 steps with a railing? : [employer] 6 Click Score: 14    End of Session Equipment Utilized During Treatment: Gait belt Activity Tolerance: Patient tolerated treatment well Patient left: in chair;with call bell/phone within reach;with chair alarm set(chair alarm pad under pt but no box) Nurse Communication: Mobility status PT Visit Diagnosis: Muscle weakness (generalized) (M62.81);Difficulty in walking, not elsewhere classified (R26.2)    Time: 4268-3419 PT Time Calculation (min) (ACUTE ONLY): 35 min   Charges:   PT Evaluation $PT Eval Moderate Complexity: 1 Mod PT Treatments $Therapeutic Activity: 8-22 mins   PT G Codes:        Wray Kearns, PT, DPT [phone]    Marguarite Arbour A Kieara Schwark 05/31/2017, 1:45 PM

## 2017-05-31 NOTE — Evaluation (Signed)
Clinical/Bedside Swallow Evaluation Patient Details  Name: Sabrina [name] MRN: [id number] Date of Birth: 08-28-1926  Today's Date: 05/31/2017 Time: SLP Start Time (ACUTE ONLY): 1152 SLP Stop Time (ACUTE ONLY): 1202 SLP Time Calculation (min) (ACUTE ONLY): 10 min  Past Medical History:  Past Medical History:  Diagnosis Date  . Arthritis    neck  . Breast cancer ([place]) 03/2013   right  . Chronic kidney disease (CKD), stage III (moderate) (HCC)    nephrologist, Dr. [name]  . Dental crowns present   . Heart murmur    no known problems; states did not know she had murmur until age 40  . High cholesterol   . Hypertension    fluctuates, especially when stressed; has been on med. > 20 yr.  . Immature cataract   . Non-insulin dependent type 2 diabetes mellitus ([name])   . Radiation 09/06/13-10/20/13   Right Breast Cancer  . Wears partial dentures    lower   Past Surgical History:  Past Surgical History:  Procedure Laterality Date  . AXILLARY LYMPH NODE DISSECTION Right 03/30/2013   Procedure: AXILLARY LYMPH NODE DISSECTION;  Surgeon: Rolm Bookbinder, MD;  Location: [place];  Service: General;  Laterality: Right;  . BREAST LUMPECTOMY WITH NEEDLE LOCALIZATION Right 03/30/2013   Procedure: BREAST LUMPECTOMY WITH NEEDLE LOCALIZATION;  Surgeon: Rolm Bookbinder, MD;  Location: [place];  Service: General;  Laterality: Right;  . BREAST SURGERY Right 11/1958   right breast biopsy benign  . DILATION AND CURETTAGE OF UTERUS    . PORTACATH PLACEMENT N/A 04/15/2013   Procedure: INSERTION PORT-A-CATH;  Surgeon: Rolm Bookbinder, MD;  Location: [place];  Service: General;  Laterality: N/A;  . RE-EXCISION OF BREAST CANCER,SUPERIOR MARGINS Right 04/15/2013   Procedure: RE-EXCISION OF RIGHT BREAST  MARGINS;  Surgeon: Rolm Bookbinder, MD;  Location: [place];  Service: General;  Laterality: Right;  . TONSILLECTOMY     as a child   HPI:  is an 84 y.o. female  Recent hospital admission on 3/25-3/27/2019 for frequent falls, HTN, HLD, Hx of DVT/PE - previously on Coumadin, Hx Breast Cancer 4 years ago, CHF and CKD who presents to the ED today with complaints of CP after ground level fall last night and now blurry vision.  MRI is positive for acute bilateral occipital infarcts.   Assessment / Plan / Recommendation Clinical Impression  Pt reportedly had a coughing episode, followed by regurgitation, during breakfast meal this morning. During SLP visit her oropharyngeal function appears [facility] and there are no overt signs of aspiration. She does not have her lower partials, but she masticated and cleared a graham cracker well. Recommend restarting regular diet textures and thin liquids. SLP will f/u briefly for tolerance given difficulty observed by nursing this morning, but do not anticipate d/c needs. SLP Visit Diagnosis: Dysphagia, unspecified (R13.10)    Aspiration Risk  Mild aspiration risk    Diet Recommendation Regular;Thin liquid   Liquid Administration via: Cup;Straw Medication Administration: Whole meds with liquid Supervision: Staff to assist with self feeding;Comment(will likely need assistance for seeing things on her tray) Compensations: Slow rate;Small sips/bites Postural Changes: Seated upright at 90 degrees    Other  Recommendations Oral Care Recommendations: Oral care BID   Follow up Recommendations None      Frequency and Duration min 1 x/week  1 week       Prognosis Prognosis for Safe Diet Advancement: Good      Swallow Study   General HPI:  is an 84 y.o. female Recent hospital admission on 3/25-3/27/2019 for frequent falls, HTN, HLD, Hx of DVT/PE - previously on Coumadin, Hx Breast Cancer 4 years ago, CHF and CKD who presents to the ED today with complaints of CP after ground level fall last night and now blurry vision.  MRI is positive for acute bilateral occipital infarcts. Type of Study: Bedside Swallow Evaluation Previous  Swallow Assessment: none in chart Diet Prior to this Study: NPO(had been on regular diet but was coughing with breakfast) Temperature Spikes Noted: No Respiratory Status: Room air History of Recent Intubation: No Behavior/Cognition: Alert;Cooperative;Pleasant mood Oral Cavity Assessment: Within Functional Limits Oral Care Completed by SLP: No Oral Cavity - Dentition: Adequate natural dentition;Missing dentition;Dentures, not available Vision: Impaired for self-feeding Self-Feeding Abilities: Able to feed self;Needs assist Patient Positioning: Upright in bed Baseline Vocal Quality: Normal Volitional Cough: Strong Volitional Swallow: Able to elicit    Oral/Motor/Sensory Function Overall Oral Motor/Sensory Function: Within functional limits   Ice Chips Ice chips: Not tested   Thin Liquid Thin Liquid: Within functional limits Presentation: Cup;Self Fed;Straw    Nectar Thick Nectar Thick Liquid: Not tested   Honey Thick Honey Thick Liquid: Not tested   Puree Puree: Within functional limits Presentation: Self Fed;Spoon   Solid   GO   Solid: Within functional limits Presentation: Self Ennis Forts 05/31/2017,12:59 PM   [name], M.A. CCC-SLP [phone]

## 2017-05-31 NOTE — Evaluation (Signed)
Speech Language Pathology Evaluation Patient Details Name: Sabrina [name] MRN: [id number] DOB: Jul 16, 1926 Today's Date: 05/31/2017 Time: 1202-1219 SLP Time Calculation (min) (ACUTE ONLY): 17 min  Problem List:  Patient Active Problem List   Diagnosis Date Noted  . History of breast cancer 05/30/2017  . Weakness 05/30/2017  . Occipital infarction ([place]) 05/30/2017  . Pressure injury of skin 05/28/2017  . Fall at home 05/27/2017  . Chronic kidney disease (CKD), stage III (moderate) ([place]) 05/27/2017  . High cholesterol 05/27/2017  . DM (diabetes mellitus) ([place]) 05/27/2017  . Hypertension 05/27/2017  . Gout 05/27/2017  . Elevated troponin 05/27/2017  . Lipoma of lower extremity 09/12/2014  . Abnormal x-ray 03/15/2014  . Chronic diastolic congestive heart failure ([place]) 02/23/2014  . Osteopenia 11/30/2013  . Hypoglycemia 09/13/2013  . Acute respiratory failure with hypoxia ([place]) 06/14/2013  . Pulmonary embolism ([name]) 06/10/2013  . Nausea and vomiting 06/10/2013  . Accelerated hypertension 06/10/2013  . Aortic stenosis 04/22/2013  . Edema leg 04/22/2013  . Breast cancer of upper-outer quadrant of right female breast ([place]) 03/19/2013   Past Medical History:  Past Medical History:  Diagnosis Date  . Arthritis    neck  . Breast cancer ([place]) 03/2013   right  . Chronic kidney disease (CKD), stage III (moderate) (HCC)    nephrologist, Dr. [name]  . Dental crowns present   . Heart murmur    no known problems; states did not know she had murmur until age 48  . High cholesterol   . Hypertension    fluctuates, especially when stressed; has been on med. > 20 yr.  . Immature cataract   . Non-insulin dependent type 2 diabetes mellitus ([place])   . Radiation 09/06/13-10/20/13   Right Breast Cancer  . Wears partial dentures    lower   Past Surgical History:  Past Surgical History:  Procedure Laterality Date  . AXILLARY LYMPH NODE DISSECTION Right 03/30/2013   Procedure:  AXILLARY LYMPH NODE DISSECTION;  Surgeon: Rolm Bookbinder, MD;  Location: [place];  Service: General;  Laterality: Right;  . BREAST LUMPECTOMY WITH NEEDLE LOCALIZATION Right 03/30/2013   Procedure: BREAST LUMPECTOMY WITH NEEDLE LOCALIZATION;  Surgeon: Rolm Bookbinder, MD;  Location: [place];  Service: General;  Laterality: Right;  . BREAST SURGERY Right 11/1958   right breast biopsy benign  . DILATION AND CURETTAGE OF UTERUS    . PORTACATH PLACEMENT N/A 04/15/2013   Procedure: INSERTION PORT-A-CATH;  Surgeon: Rolm Bookbinder, MD;  Location: [place];  Service: General;  Laterality: N/A;  . RE-EXCISION OF BREAST CANCER,SUPERIOR MARGINS Right 04/15/2013   Procedure: RE-EXCISION OF RIGHT BREAST  MARGINS;  Surgeon: Rolm Bookbinder, MD;  Location: [place];  Service: General;  Laterality: Right;  . TONSILLECTOMY     as a child   HPI:  Pt is an 84 y.o. female Recent hospital admission on 3/25-3/27/2019 for frequent falls, HTN, HLD, Hx of DVT/PE - previously on Coumadin, Hx Breast Cancer 4 years ago, CHF and CKD who presents to the ED today with complaints of CP after ground level fall last night and now blurry vision.  MRI is positive for acute bilateral occipital infarcts.   Assessment / Plan / Recommendation Clinical Impression   Pt is likely at or near her cognitive and communicative baseline, and she denies any acute changes other than with her vision. She is tangential in conversation but appears to be retaining new information well and can alternate her attention between conversation and other  activities. She does not initiate a lot of problem solving around her visual deficits initially, but with encouragement to try to do simple tasks for herself, she does demonstrate good emergent awareness and functional problem solving. No acute SLP needs identified. Will follow briefly for dysphagia only.    SLP Assessment  SLP Recommendation/Assessment: Patient does not  need any further Speech Lanaguage Pathology Services SLP Visit Diagnosis: Cognitive communication deficit (R41.841)    Follow Up Recommendations  None;Other (comment)(intermittent supervision upon return home)    Frequency and Duration min 1 x/week         SLP Evaluation Cognition  Overall Cognitive Status: Within Functional Limits for tasks assessed       Comprehension  Auditory Comprehension Overall Auditory Comprehension: Appears within functional limits for tasks assessed    Expression Expression Primary Mode of Expression: Verbal Verbal Expression Overall Verbal Expression: Appears within functional limits for tasks assessed Written Expression Dominant Hand: Right   Oral / Motor  Oral Motor/Sensory Function Overall Oral Motor/Sensory Function: Within functional limits Motor Speech Overall Motor Speech: Appears within functional limits for tasks assessed   GO                    Sabrina [name] 05/31/2017, 1:27 PM  Sabrina [name], M.A. CCC-SLP [phone]

## 2017-06-01 ENCOUNTER — Other Ambulatory Visit: Payer: Self-pay

## 2017-06-01 LAB — BASIC METABOLIC PANEL
Anion gap: 9 (ref 5–15)
BUN: 47 mg/dL — ABNORMAL HIGH (ref 6–20)
CO2: 22 mmol/L (ref 22–32)
Calcium: 7.8 mg/dL — ABNORMAL LOW (ref 8.9–10.3)
Chloride: 109 mmol/L (ref 101–111)
Creatinine, Ser: 2.07 mg/dL — ABNORMAL HIGH (ref 0.44–1.00)
GFR calc Af Amer: 23 mL/min — ABNORMAL LOW (ref >60)
GFR calc non Af Amer: 20 mL/min — ABNORMAL LOW (ref >60)
Glucose, Bld: 205 mg/dL — ABNORMAL HIGH (ref 65–99)
Potassium: 4.2 mmol/L (ref 3.5–5.1)
Sodium: 140 mmol/L (ref 135–145)

## 2017-06-01 LAB — GLUCOSE, CAPILLARY
Glucose-Capillary: 159 mg/dL — ABNORMAL HIGH (ref 65–99)
Glucose-Capillary: 178 mg/dL — ABNORMAL HIGH (ref 65–99)
Glucose-Capillary: 151 mg/dL — ABNORMAL HIGH (ref 65–99)
Glucose-Capillary: 109 mg/dL — ABNORMAL HIGH (ref 65–99)

## 2017-06-01 MED ORDER — FUROSEMIDE 40 MG PO TABS: 40.0000 mg | ORAL_TABLET | Freq: Every day | ORAL | Status: DC

## 2017-06-01 MED ORDER — VERAPAMIL HCL ER 240 MG PO TBCR
240.0000 mg | EXTENDED_RELEASE_TABLET | Freq: Every day | ORAL | Status: DC
Start: 2017-06-01 — End: 2017-06-04
  Administered 2017-06-01 – 2017-06-04 (×4): 240 mg via ORAL
  Filled 2017-06-01 (×4): qty 1

## 2017-06-01 MED ORDER — ACETAMINOPHEN 500 MG PO TABS
1000.0000 mg | ORAL_TABLET | Freq: Three times a day (TID) | ORAL | Status: DC
  Administered 2017-06-01 (×3): 1000 mg via ORAL
  Administered 2017-06-02: 500 mg via ORAL
  Administered 2017-06-02 – 2017-06-04 (×5): 1000 mg via ORAL
  Filled 2017-06-01 (×10): qty 2

## 2017-06-01 MED ORDER — GABAPENTIN 100 MG PO CAPS
200.0000 mg | ORAL_CAPSULE | Freq: Three times a day (TID) | ORAL | Status: DC
  Administered 2017-06-01 – 2017-06-04 (×10): 200 mg via ORAL
  Filled 2017-06-01 (×10): qty 2

## 2017-06-01 MED ORDER — CLONIDINE HCL 0.1 MG PO TABS
0.2000 mg | ORAL_TABLET | Freq: Two times a day (BID) | ORAL | Status: DC
  Administered 2017-06-01 – 2017-06-04 (×7): 0.2 mg via ORAL
  Filled 2017-06-01 (×8): qty 2

## 2017-06-01 MED ORDER — FERROUS SULFATE 325 (65 FE) MG PO TABS
325.0000 mg | ORAL_TABLET | Freq: Every day | ORAL | Status: DC
  Administered 2017-06-01 – 2017-06-04 (×4): 325 mg via ORAL
  Filled 2017-06-01 (×4): qty 1

## 2017-06-01 MED ORDER — LOSARTAN POTASSIUM 50 MG PO TABS
25.0000 mg | ORAL_TABLET | Freq: Every day | ORAL | Status: DC
  Administered 2017-06-01: 25 mg via ORAL
  Filled 2017-06-01: qty 1

## 2017-06-01 MED ORDER — TRAMADOL HCL 50 MG PO TABS
50.0000 mg | ORAL_TABLET | Freq: Four times a day (QID) | ORAL | Status: DC | PRN
  Administered 2017-06-01 – 2017-06-04 (×3): 50 mg via ORAL
  Filled 2017-06-01 (×4): qty 1

## 2017-06-01 NOTE — Progress Notes (Signed)
Rehab Admissions Coordinator Note:  Patient was screened by Retta Diones for appropriateness for an Inpatient Acute Rehab Consult.  At this time, we are recommending Inpatient Rehab consult.  Retta Diones 06/01/2017, 9:16 AM  I can be reached at [phone].

## 2017-06-01 NOTE — Progress Notes (Signed)
STROKE TEAM PROGRESS NOTE   HISTORY OF PRESENT ILLNESS (per record) Sabrina [name] is an 84 y.o. female with a recent hospital admission on 3/25-3/27/2019 for frequent falls, HTN, HLD, Hx of DVT/PE - previously on Coumadin, Hx Breast Cancer 4 years ago, CHF and CKD who presents to the ED today with complaints of CP after ground level fall last night and now blurry vision.  MRI is positive for acute bilateral occipital infarcts. Neurology has been consulted for acute stroke.  The patient states she has had blurry vision for the past few days. She describes it as "double vision". She had her glasses checked yesterday and made another appointment to see her eye doctor on Monday April 1st. Last night she tripped on her walker and came to the ED today because she continues to have chest pain from hitting the walker. She states she is compliant with her ASA 81 mg and Lipitor 10 mg regime and takes them daily. She denies any new weakness or numbness. Denies any H/A or difficulty swallowing.  Date last known well: Unable to determine Time last known well: Unable to determine tPA Given: No: outside the window    SUBJECTIVE (INTERVAL HISTORY) Her family is not at bedside. Eating breakfast this morning. Still having vision changes. Pending TEE tomorrow.     OBJECTIVE Temp:  [98.6 F (37 C)-99.4 F (37.4 C)] 98.6 F (37 C) (03/31 1226) Pulse Rate:  [98-115] 102 (03/31 1226) Cardiac Rhythm: Sinus tachycardia;Heart block (03/31 0700) Resp:  [16-23] 16 (03/31 1226) BP: (142-201)/(61-84) 180/61 (03/31 1226) SpO2:  [92 %-98 %] 94 % (03/31 1226)  CBC:  Recent Labs  Lab 05/26/17 2332 05/28/17 0001 05/30/17 1618 05/31/17 0524  WBC 11.1* 15.3*  --  9.2  NEUTROABS 8.1*  --  7.2  --   HGB 12.2 12.7  --  10.1*  HCT 36.9 39.6  --  31.4*  MCV 97.1 99.2  --  98.4  PLT 215 267  --  768    Basic Metabolic Panel:  Recent Labs  Lab 05/31/17 0524 06/01/17 1033  NA 136 140  K 4.1 4.2  CL 101 109   CO2 22 22  GLUCOSE 178* 205*  BUN 76* 47*  CREATININE 3.52* 2.07*  CALCIUM 8.2* 7.8*    Lipid Panel:     Component Value Date/Time   CHOL 101 05/31/2017 0524   TRIG 109 05/31/2017 0524   HDL 40 (L) 05/31/2017 0524   CHOLHDL 2.5 05/31/2017 0524   VLDL 22 05/31/2017 0524   LDLCALC 39 05/31/2017 0524   HgbA1c:  Lab Results  Component Value Date   HGBA1C 7.4 (H) 05/27/2017   Urine Drug Screen: No results found for: LABOPIA, COCAINSCRNUR, LABBENZ, AMPHETMU, THCU, LABBARB  Alcohol Level No results found for: [facility]  IMAGING  Dg Ribs Unilateral W/chest Right 05/30/2017 IMPRESSION:  1. No visible rib fracture or intrathoracic injury.  2. Cholelithiasis.    Ct Head Wo Contrast 05/30/2017 IMPRESSION:  1. No definite acute intracranial abnormality. Apparent hypodensity within the right greater than left occipital lobes is favored artifactual, less likely due to infarct or posterior reversible encephalopathy syndrome. Correlate for visual symptoms or hypertensive urgency.    Ct Cervical Spine Wo Contrast 05/30/2017 IMPRESSION:  No acute cervical spine fracture. Mild-to-moderate degenerative disc disease in the lower cervical spine. Moderate to severe facet arthropathy throughout the cervical spine.    Mr Brain Wo Contrast 05/30/2017 IMPRESSION:  1. Acute bilateral occipital lobe infarcts.  2. Mild chronic  small vessel ischemic disease.    Transthoracic Echocardiogram  05/27/2017 Study Conclusions - Procedure narrative: Transthoracic echocardiography. Image   quality was poor. The study was technically difficult, as a   result of poor sound wave transmission. - Left ventricle: The cavity size was normal. Wall thickness was   increased in a pattern of mild LVH. There was mild concentric   hypertrophy. Systolic function was normal. The estimated ejection   fraction was in the range of 55% to 60%. Wall motion was normal;   there were no regional wall motion abnormalities.  Doppler   parameters are consistent with abnormal left ventricular   relaxation (grade 1 diastolic dysfunction). - Aortic valve: There was mild stenosis. Valve area (VTI): 1.75   cm^2. Valve area (Vmax): 1.8 cm^2. Valve area (Vmean): 1.7 cm^2. - Mitral valve: Severely calcified annulus. Valve area by   continuity equation (using LVOT flow): 1.95 cm^2. - Atrial septum: No defect or patent foramen ovale was identified.   Bilateral Carotid Dopplers  05/31/2017 1-39% ICA stenosis. Vertebral artery flow is antegrade.     PHYSICAL EXAM Vitals:   05/31/17 2359 06/01/17 0400 06/01/17 0847 06/01/17 1226  BP: (!) 142/78 (!) 171/84 (!) 194/74 (!) 180/61  Pulse: (!) 109 98 (!) 108 (!) 102  Resp: 20 (!) 21 18 16   Temp: 99.4 F (37.4 C) 99.3 F (37.4 C) 99.4 F (37.4 C) 98.6 F (37 C)  TempSrc: Oral Oral Oral Oral  SpO2: 92% 94% 98% 94%  Weight:      Height:       Physical exam: Exam: Gen: NAD,                  CV: RRR, no MRG. No Carotid Bruits. No peripheral edema, warm, nontender Eyes: Conjunctivae clear without exudates or hemorrhage  Neuro: Detailed Neurologic Exam  Speech:    Speech is normal Cognition:    The patient is oriented to person, month and year, follow commands but appears to have some baseline memory issues   Cranial Nerves:    The pupils are equal, round, and reactive to light.  Visual fields are impaired peripherally she reports blurred vision but can see in all quadrants. Extraocular movements are intact but reports diplopia. Trigeminal sensation is intact and the muscles of mastication are normal. The face is symmetric. The palate elevates in the midline. Hearing intact. Voice is normal. Shoulder shrug is normal. The tongue has normal motion without fasciculations.   Coordination:    No dysmetria noted  Motor Observation:    No asymmetry Tone:    Normal muscle tone.      Strength:    Strength is symmetrical and anti-gravity bilaterally, difficult exam  due to non-cooperation slight agitation     Sensation: intact to LT     Reflex Exam:  DTR's:    Deep tendon reflexes in the upper and lower extremities are symmetrical bilaterally.   Toes:    The toes are equivocal bilaterally.   Clonus:    Clonus is absent.    ASSESSMENT/PLAN Ms. Sabrina [name] is a 84 y.o. female with history of frequent falls, DM, CKD, HTN, HLD, Hx of DVT/PE  presenting with . She did not receive IV t-PA due to late presentation.  Stroke:  Acute bilateral occipital lobe infarcts - possibly embolic.  Resultant  diplopia  CT head - no definite acute intracranial abnormality.  MRI head - acute bilateral occipital lobe infarcts.   MRA head - not performed  Carotid Doppler - 1-39% ICA stenosis.  Vertebral artery flow is antegrade.   2D Echo - EF 55 - 60%. Lt [facility] -  no defect or patent foramen ovale was identified.  Needs TEE and loop  LDL - 39  HgbA1c - 7.4  VTE prophylaxis - [place] heparin Fall precautions Diet heart healthy/carb modified Room service appropriate? Yes; Fluid consistency: Thin Diet NPO time specified Except for: Sips with Meds  aspirin 81 mg daily prior to admission, now on aspirin 325 mg daily  Patient counseled to be compliant with her antithrombotic medications  Ongoing aggressive stroke risk factor management  Therapy recommendations:  CIR recommended  Disposition:  Pending  Hypertension  Stable  Permissive hypertension (OK if < 220/120) but gradually normalize in 5-7 days.  Long-term BP goal normotensive  Hyperlipidemia  Lipid lowering medication PTA:  Lipitor 10 mg daily  LDL 39, goal < 70  Current lipid lowering medication: Lipitor 40 mg daily  Continue statin at discharge  Diabetes  HgbA1c 7.4, goal < 7.0  Uncontrolled  Other Stroke Risk Factors  Advanced age  ETOH use, advised to drink no more than 1 alcoholic beverage per day.  Obesity, Body mass index is 34.55 kg/m., recommend weight loss, diet  and exercise as appropriate    Other Active Problems  Anemia  CKD   Plan / Recommendations   Stroke workup - complete except for TEE / loop  TEE and loop Monday (ordered)  Gradually normalize BP   Hospital day # 2  Personally examined patient and images, and have participated in and made any corrections needed to history, physical, neuro exam,assessment and plan as stated above.  I have personally obtained the history, evaluated lab date, reviewed imaging studies and agree with radiology interpretations.      To contact Stroke Continuity provider, please refer to [email or web address] After hours, contact General Neurology

## 2017-06-01 NOTE — Evaluation (Signed)
Occupational Therapy Evaluation Patient Details Name: Sabrina [name] MRN: [id number] DOB: [birth year] Today's Date: 06/01/2017    History of Present Illness Patient is a 84 y/o female who was recently admitted 3/25-3/27 due to multiple falls now presents with another fall at home, CP and blurry vision. Brain MRI-bilateral occipital infarcts. PMH includes DM, breast ca, PE, DVT, rt hip pain, CKD.   Clinical Impression   PTA, pt was living with her husband and was independent with ADLs and IADLs. Recently, pt has been limited with IADLs and functional mobility with a RW due to RLE pain. Currently pt requiring Min A for self feeding and grooming, Min A for UB ADLs, Mod A for LB ADLs, and Max A for functional mobility with RW. Pt motivated to participate in therapy. Pt with significant vision deficits impacting all ADLs and functional mobility as well as making pt a high fall risk. Pt with blurry vision in inferior visual fields and horizontal diplopia. Pt will require further acute OT to increase safety and independence with ADLs and functional mobility. Recommend dc to CIR for further intensive OT to address visual deficits during ADLs, optimize safety, increase independence with ADLs, and reduce fall risk.      Follow Up Recommendations  CIR;Supervision/Assistance - 24 hour    Equipment Recommendations  Other (comment)(Defer to next venue)    Recommendations for Other Services Rehab consult;PT consult     Precautions / Restrictions Precautions Precautions: Fall Precaution Comments: impaired vision Restrictions Weight Bearing Restrictions: No      Mobility Bed Mobility Overal bed mobility: Needs Assistance Bed Mobility: Supine to Sit     Supine to sit: Min assist;HOB elevated;Min guard     General bed mobility comments: Min Guard for bringing BLEs to EOB and elevating trunk. Pt requiring Min A to bring right hip towards EOB  Transfers Overall transfer level: Needs  assistance Equipment used: Rolling walker (2 wheeled);None Transfers: Sit to/from [facility] Sit to Stand: Min assist         General transfer comment: Min A for balance in standing. VCs for hand placement. Requiring Max cues for sequencing due to visual deficits    Balance Overall balance assessment: Needs assistance Sitting-balance support: Feet supported;No upper extremity supported Sitting balance-Leahy Scale: Fair     Standing balance support: During functional activity;Bilateral upper extremity supported Standing balance-Leahy Scale: Poor Standing balance comment: Requires BUE support in standing and min A.                            ADL either performed or assessed with clinical judgement   ADL Overall ADL's : Needs assistance/impaired Eating/Feeding: Minimal assistance;Sitting Eating/Feeding Details (indicate cue type and reason): Pt requiring Min A to prepare food due to vision deficits.  Grooming: Wash/dry hands;Standing;Cueing for sequencing;Minimal assistance Grooming Details (indicate cue type and reason): Pt performing hand hygiene at sink with Min A for hand over hand A to locate objects in sick such as soap, faucet, and towel.  Upper Body Bathing: Sitting;Minimal assistance   Lower Body Bathing: Sit to/from stand;Moderate assistance   Upper Body Dressing : Minimal assistance;Sitting   Lower Body Dressing: Moderate assistance;Sit to/from stand   Toilet Transfer: BSC;RW;Maximal assistance;Ambulation;Cueing for sequencing;Minimal assistance Toilet Transfer Details (indicate cue type and reason): Pt performing funcitonal mobility to bathroom. However, requiring Max A to guide and manage walker due to vision deficits. Pt unable to navigate within room. Pt requiring Min A  for balance.  Toileting- Clothing Manipulation and Hygiene: Sit to/from stand;Cueing for sequencing;Minimal assistance Toileting - Clothing Manipulation Details (indicate  cue type and reason): Pt requiring Min A for managing gown during peri care     Functional mobility during ADLs: Maximal assistance;Rolling walker;Minimal assistance(Max A for vision deficits) General ADL Comments: Pt with signficant vision deficits impacting her functional performance. Pt requiring Max cues for navigating around room and locating objects during ADLs. Pt is at a HIGH risk for falls. Pt unable to complete ADLs and functional mobility without assistance     Vision Baseline Vision/History: Wears glasses Wears Glasses: At all times Patient Visual Report: Diplopia;Blurring of vision Vision Assessment?: Yes Eye Alignment: Within Functional Limits Ocular Range of Motion: Within Functional Limits Alignment/Gaze Preference: Within Defined Limits Tracking/Visual Pursuits: Impaired - to be further tested in functional context(Unable to track due to diplopia and fatigue) Diplopia Assessment: Objects split side to side;Present in near gaze;Present in far gaze;Present in primary gaze;Other (comment);Disappears with one eye closed(Decreases during right/left gaze) Depth Perception: (Overshoorts to right) Additional Comments: Right eye dominant?  Pt with blurry vision in inferior visual field. Dipopia with object side by side; reports diplopia reducing when covering left eye. Provided visual occulsion to nasal portion of left eye on glasses. Pt with inconsitance report for effectivness of visual occulsion     Perception     Praxis      Pertinent Vitals/Pain Pain Assessment: No/denies pain     Hand Dominance Right   Extremity/Trunk Assessment Upper Extremity Assessment Upper Extremity Assessment: Generalized weakness   Lower Extremity Assessment Lower Extremity Assessment: Defer to PT evaluation   Cervical / Trunk Assessment Cervical / Trunk Assessment: Kyphotic   Communication Communication Communication: No difficulties   Cognition Arousal/Alertness: Awake/alert Behavior  During Therapy: WFL for tasks assessed/performed Overall Cognitive Status: Within Functional Limits for tasks assessed                                 General Comments: Able to follow commands and answer questions   General Comments       Exercises     Shoulder Instructions      Home Living Family/patient expects to be discharged to:: Private residence Living Arrangements: Spouse/significant other Available Help at Discharge: Family Type of Home: House Home Access: Stairs to enter Technical brewer of Steps: 2-back Entrance Stairs-Rails: None Home Layout: One level     Bathroom Shower/Tub: Occupational psychologist: Standard     Home Equipment: Cane - single point;Walker - 2 wheels      Lives With: Spouse    Prior Functioning/Environment Level of Independence: Independent with assistive device(s)        Comments: using walker last few weeks due to R LE pain. at baseline, uses cane. Spouse has dementia. Multiple falls at home. Performs ADLs, IADLs, and driving (stopped driving when RLE started hurting). Has someone come in to clean        OT Problem List: Decreased strength;Decreased activity tolerance;Impaired balance (sitting and/or standing);Decreased knowledge of use of DME or AE;Impaired vision/perception      OT Treatment/Interventions: Self-care/ADL training;DME and/or AE instruction;Patient/family education;Balance training;Therapeutic activities;Therapeutic exercise;Energy conservation;Visual/perceptual remediation/compensation    OT Goals(Current goals can be found in the care plan section) Acute Rehab OT Goals Patient Stated Goal: to go home to my husband OT Goal Formulation: With patient Time For Goal Achievement: 06/11/17 Potential to Achieve Goals:  Good ADL Goals Pt Will Perform Grooming: with set-up;with supervision;standing Pt Will Perform Upper Body Dressing: with set-up;with supervision;sitting Pt Will Perform Lower  Body Dressing: with set-up;with supervision;sit to/from stand Pt Will Transfer to Toilet: with set-up;with supervision;ambulating;bedside commode Pt Will Perform Toileting - Clothing Manipulation and hygiene: with set-up;with supervision;sit to/from stand Additional ADL Goal #1: Pt will independent verbalize understanding of three compensatory techniques for vision Additional ADL Goal #2: Pt with demonstrate management of visual occlusion taping with 1-2 VCs Additional ADL Goal #3: Pt will navigate home distance environments with 1-2 VCs during ADLs  OT Frequency: Min 3X/week   Barriers to D/C:            Co-evaluation              AM-PAC PT "6 Clicks" Daily Activity     Outcome Measure Help from another person eating meals?: A Little Help from another person taking care of personal grooming?: A Lot Help from another person toileting, which includes using toliet, bedpan, or urinal?: A Lot Help from another person bathing (including washing, rinsing, drying)?: A Lot Help from another person to put on and taking off regular upper body clothing?: A Little Help from another person to put on and taking off regular lower body clothing?: A Lot 6 Click Score: 14   End of Session Equipment Utilized During Treatment: Gait belt;Rolling walker Nurse Communication: Mobility status;Other (comment)(Vision deficits)  Activity Tolerance: Patient tolerated treatment well Patient left: in chair;with call bell/phone within reach;with chair alarm set;with nursing/sitter in room  OT Visit Diagnosis: Muscle weakness (generalized) (M62.81);Low vision, both eyes (H54.2);Unsteadiness on feet (R26.81);Other abnormalities of gait and mobility (R26.89);History of falling (Z91.81)                Time: 9242-6834 OT Time Calculation (min): 34 min Charges:  OT General Charges $OT Visit: 1 Visit OT Evaluation $OT Eval Moderate Complexity: 1 Mod OT Treatments $Self Care/Home Management : 8-22  mins G-Codes:     Jennavieve Arrick MSOT, OTR/L Acute Rehab Pager: [phone] Office: [place] 06/01/2017, 5:34 PM

## 2017-06-01 NOTE — Plan of Care (Signed)
  Problem: Education: Goal: Knowledge of patient specific risk factors addressed and post discharge goals established will improve Outcome: Progressing   Problem: Education: Goal: Knowledge of secondary prevention will improve Outcome: Progressing   Problem: Coping: Goal: Will verbalize positive feelings about self Outcome: Progressing   Problem: Health Behavior/Discharge Planning: Goal: Ability to manage health-related needs will improve Outcome: Progressing   Problem: Nutrition: Goal: Risk of aspiration will decrease Outcome: Progressing   Problem: Ischemic Stroke/TIA Tissue Perfusion: Goal: Complications of ischemic stroke/TIA will be minimized Outcome: Progressing   Problem: Education: Goal: Knowledge of General Education information will improve Outcome: Progressing

## 2017-06-01 NOTE — Progress Notes (Signed)
PROGRESS NOTE    Sabrina [name]  XQJ:[id number] DOB: [birth year] DOA: 05/30/2017 PCP: [name], MD      Brief Narrative:  Sabrina [name] is a 84 y.o. F with CKD stage IV, HTN, BrCA, and DM who presents with falls and new onset blindness.  The patient blurry vision several days ago, which she describes as "double vision".  She had her glasses checked the day before admission and made another appointment to see her eye doctor on Monday April 1st but the night before admission she tripped on her walker and came to the ED because of chest pain from hitting the walker.      Assessment & Plan:  Stroke, bilateral occipital, likely embolic K4Y 1.8%, LDL 39.  MRI shows "patchy acute infarcts in both occipital lobes" new from MRI 4 days ago.  Hemoglobin A1c near goal, LDL 39.  MRI shows bilateral occipital lobe infarcts. -Continue aspirin 325 -Continue statin -Restart clonidine, verapamil today -Hold furosemide, ARB due to renal function -Continue telemetry -Consult neurology, appreciate recommendations    Acute kidney injury on chronic kidney disease stage IV FenA 0.7%, suggesting pre-renal injury.    Baseline 1.7, repeat pending.  Has been taking ibuprofen recently for leg pain. UA with no casts.  Leukocytes noted.   -Trend BMP -Continue gentle IV fluids - Avoid nephrotoxins and hypotension  Chest pain This is from falling on her walker.  CXR and dedicated rib films negative.  Hypertension -Restart verapamil, clonidine -Hold furosemide, Cozaar -Labetalol PRN for severe BP elevation, otherwise permissive HTN for now  History breast cancer  Continue Arimidex  History gout Quiescent Continue allopurinol  Diabetes HgbA1c 7.4%.  Blood sugars well controlled. -Continue SSI    DVT prophylaxis: Lovenox Code Status: FULL Family Communication: None present MDM and disposition Plan: Below labs and imaging reports were reviewed.  Repeat labs were ordered.  Patient status is  clinically unchanged.  Medication adjustments as above.  PT recommend patient rehab, insurance authorization and inpatient rehab MD evaluation is pending.         Consultants:   Neurology  Procedures:   CT head and cspine 3/29 IMPRESSION: 1. No definite acute intracranial abnormality. Apparent hypodensity within the right greater than left occipital lobes is favored artifactual, less likely due to infarct or posterior reversible encephalopathy syndrome. Correlate for visual symptoms or hypertensive urgency. 2. No acute cervical spine fracture. Mild-to-moderate degenerative disc disease in the lower cervical spine. Moderate to severe facet arthropathy throughout the cervical spine.   Rib xrays 3/29 IMPRESSION: 1. No visible rib fracture or intrathoracic injury. 2. Cholelithiasis.    MRI brain 3/29 IMPRESSION: 1. Acute bilateral occipital lobe infarcts. 2. Mild chronic small vessel ischemic disease     Antimicrobials:   None    Subjective: Blindness unchanged.  No focal weakness, slurred speech, ataxia, confusion, aphasia, fever, cough, sputum.  Her chronic right-sided sciatica is worse today.    Objective: Vitals:   05/31/17 2001 05/31/17 2359 06/01/17 0400 06/01/17 0847  BP: (!) 170/79 (!) 142/78 (!) 171/84 (!) 194/74  Pulse: (!) 115 (!) 109 98 (!) 108  Resp: (!) 23 20 (!) 21 18  Temp: 98.6 F (37 C) 99.4 F (37.4 C) 99.3 F (37.4 C) 99.4 F (37.4 C)  TempSrc: Oral Oral Oral Oral  SpO2: 98% 92% 94% 98%  Weight:      Height:        Intake/Output Summary (Last 24 hours) at 06/01/2017 1104 Last data filed at 06/01/2017 0800  Gross per 24 hour  Intake 986.25 ml  Output 150 ml  Net 836.25 ml   Filed Weights   05/30/17 2355  Weight: 77.6 kg (171 lb 1.2 oz)    Examination: General appearance: Female, lying in bed, interactive, no acute distress. HEENT: Eyes normal, sclera anicteric, conjunctivae and lids are normal, vision is impaired.  Hearing is  normal.  OP tacky dry, but no oral lesions.   Skin: Is warm and dry without rashes, jaundice. Cardiac: Heart rate and rhythm are regular.  No murmurs.  No lower extremity edema. Respiratory: Effort normal, lungs clear without rales or wheezes. Abdomen: Abdomen soft.  No TTP. No ascites, distension, hepatosplenomegaly.   MSK: No deformities or effusions. Neuro: Blind.  Otherwise cranial nerves normal.  Sensation intact light touch. Psych: Oriented to person place and time, affect normal, judgment and insight normal, no hallucinations.    Data Reviewed: I have personally reviewed following labs and imaging studies:  CBC: Recent Labs  Lab 05/26/17 2332 05/28/17 0001 05/30/17 1618 05/31/17 0524  WBC 11.1* 15.3*  --  9.2  NEUTROABS 8.1*  --  7.2  --   HGB 12.2 12.7  --  10.1*  HCT 36.9 39.6  --  31.4*  MCV 97.1 99.2  --  98.4  PLT 215 267  --  299   Basic Metabolic Panel: Recent Labs  Lab 05/26/17 2332 05/28/17 0001 05/31/17 0524  NA 142 144 136  K 4.2 4.0 4.1  CL 104 106 101  CO2 26 25 22   GLUCOSE 195* 231* 178*  BUN 62* 42* 76*  CREATININE 1.85* 1.68* 3.52*  CALCIUM 10.0 9.5 8.2*   GFR: Estimated Creatinine Clearance: 9.9 mL/min (A) (by C-G formula based on SCr of 3.52 mg/dL (H)). Liver Function Tests: Recent Labs  Lab 05/31/17 0524  AST 33  ALT 30  ALKPHOS 67  BILITOT 0.9  PROT 5.7*  ALBUMIN 2.9*   No results for input(s): LIPASE, AMYLASE in the last 168 hours. No results for input(s): AMMONIA in the last 168 hours. Coagulation Profile: No results for input(s): INR, PROTIME in the last 168 hours. Cardiac Enzymes: Recent Labs  Lab 05/26/17 2332 05/27/17 0331 05/27/17 1340 05/27/17 1809 05/28/17 0001  TROPONINI 0.09* 0.09* 0.15* 0.14* 0.15*   BNP (last 3 results) No results for input(s): PROBNP in the last 8760 hours. HbA1C: No results for input(s): HGBA1C in the last 72 hours. CBG: Recent Labs  Lab 05/31/17 0554 05/31/17 1201 05/31/17 1656  05/31/17 2203 06/01/17 0555  GLUCAP 185* 175* 243* 194* 159*   Lipid Profile: Recent Labs    05/31/17 0524  CHOL 101  HDL 40*  LDLCALC 39  TRIG 109  CHOLHDL 2.5   Thyroid Function Tests: No results for input(s): TSH, T4TOTAL, FREET4, T3FREE, THYROIDAB in the last 72 hours. Anemia Panel: No results for input(s): VITAMINB12, FOLATE, FERRITIN, TIBC, IRON, RETICCTPCT in the last 72 hours. Urine analysis:    Component Value Date/Time   COLORURINE YELLOW 05/31/2017 0905   APPEARANCEUR CLEAR 05/31/2017 0905   LABSPEC 1.012 05/31/2017 0905   PHURINE 6.0 05/31/2017 0905   GLUCOSEU NEGATIVE 05/31/2017 0905   HGBUR SMALL (A) 05/31/2017 0905   BILIRUBINUR NEGATIVE 05/31/2017 0905   KETONESUR NEGATIVE 05/31/2017 0905   PROTEINUR NEGATIVE 05/31/2017 0905   UROBILINOGEN 0.2 09/13/2013 0523   NITRITE NEGATIVE 05/31/2017 0905   LEUKOCYTESUR LARGE (A) 05/31/2017 0905   Sepsis Labs: @LABRCNTIP (procalcitonin:4,lacticacidven:4)  )No results found for this or any previous visit (from the past  240 hour(s)).       Radiology Studies: Dg Ribs Unilateral W/chest Right  Result Date: 05/30/2017 CLINICAL DATA:  Fall landing on walker.  Right rib pain. EXAM: RIGHT RIBS AND CHEST - 3+ VIEW COMPARISON:  Chest x-ray from 3 days ago FINDINGS: Artifact from EKG leads. Cholelithiasis. There is no edema, consolidation, effusion, or pneumothorax. Hazy peripheral opacity at the right apex that is chronic based on 2017 chest CT and likely from radiation fibrosis. Normal heart size. Stable mediastinal contours. Mitral annular calcification. Postoperative right axilla and breast. No visible rib fracture. IMPRESSION: 1. No visible rib fracture or intrathoracic injury. 2. Cholelithiasis. Electronically Signed   By: Monte Fantasia M.D.   On: 05/30/2017 12:41   Ct Head Wo Contrast  Result Date: 05/30/2017 CLINICAL DATA:  Ground level fall last night. EXAM: CT HEAD WITHOUT CONTRAST CT CERVICAL SPINE WITHOUT  CONTRAST TECHNIQUE: Multidetector CT imaging of the head and cervical spine was performed following the standard protocol without intravenous contrast. Multiplanar CT image reconstructions of the cervical spine were also generated. COMPARISON:  CT head dated May 27, 2017. FINDINGS: CT HEAD FINDINGS Brain: No evidence of acute infarction, hemorrhage, hydrocephalus, extra-axial collection or mass lesion/mass effect. Apparent hypodensity within the right greater than left occipital lobes. Stable mild atrophy and chronic microvascular ischemic changes. Vascular: Calcified atherosclerosis at the skullbase. No hyperdense vessel. Skull: Normal. Negative for fracture or focal lesion. Sinuses/Orbits: No acute finding. Other: None. CT CERVICAL SPINE FINDINGS Alignment: Trace stepwise anterolisthesis at C3-C4, C4-C5, and C5-C6 due to facet arthropathy. No traumatic malalignment. Skull base and vertebrae: No acute fracture. No primary bone lesion or focal pathologic process. Soft tissues and spinal canal: No prevertebral fluid or swelling. No visible canal hematoma. Disc levels: Mild to moderate disc height loss and uncovertebral hypertrophy from C5-C6 through C7-T1. Moderate to severe bilateral facet arthropathy throughout the cervical spine. Upper chest: Scarring/fibrosis in the right lung apex, possibly from prior radiation. Other: None. IMPRESSION: 1. No definite acute intracranial abnormality. Apparent hypodensity within the right greater than left occipital lobes is favored artifactual, less likely due to infarct or posterior reversible encephalopathy syndrome. Correlate for visual symptoms or hypertensive urgency. 2. No acute cervical spine fracture. Mild-to-moderate degenerative disc disease in the lower cervical spine. Moderate to severe facet arthropathy throughout the cervical spine. Electronically Signed   By: Titus Dubin M.D.   On: 05/30/2017 12:35   Ct Cervical Spine Wo Contrast  Result Date:  05/30/2017 CLINICAL DATA:  Ground level fall last night. EXAM: CT HEAD WITHOUT CONTRAST CT CERVICAL SPINE WITHOUT CONTRAST TECHNIQUE: Multidetector CT imaging of the head and cervical spine was performed following the standard protocol without intravenous contrast. Multiplanar CT image reconstructions of the cervical spine were also generated. COMPARISON:  CT head dated May 27, 2017. FINDINGS: CT HEAD FINDINGS Brain: No evidence of acute infarction, hemorrhage, hydrocephalus, extra-axial collection or mass lesion/mass effect. Apparent hypodensity within the right greater than left occipital lobes. Stable mild atrophy and chronic microvascular ischemic changes. Vascular: Calcified atherosclerosis at the skullbase. No hyperdense vessel. Skull: Normal. Negative for fracture or focal lesion. Sinuses/Orbits: No acute finding. Other: None. CT CERVICAL SPINE FINDINGS Alignment: Trace stepwise anterolisthesis at C3-C4, C4-C5, and C5-C6 due to facet arthropathy. No traumatic malalignment. Skull base and vertebrae: No acute fracture. No primary bone lesion or focal pathologic process. Soft tissues and spinal canal: No prevertebral fluid or swelling. No visible canal hematoma. Disc levels: Mild to moderate disc height loss and uncovertebral hypertrophy from  C5-C6 through C7-T1. Moderate to severe bilateral facet arthropathy throughout the cervical spine. Upper chest: Scarring/fibrosis in the right lung apex, possibly from prior radiation. Other: None. IMPRESSION: 1. No definite acute intracranial abnormality. Apparent hypodensity within the right greater than left occipital lobes is favored artifactual, less likely due to infarct or posterior reversible encephalopathy syndrome. Correlate for visual symptoms or hypertensive urgency. 2. No acute cervical spine fracture. Mild-to-moderate degenerative disc disease in the lower cervical spine. Moderate to severe facet arthropathy throughout the cervical spine. Electronically  Signed   By: Titus Dubin M.D.   On: 05/30/2017 12:35   Mr Brain Wo Contrast  Result Date: 05/30/2017 CLINICAL DATA:  Blurry vision for a few days. Multiple recent falls. EXAM: MRI HEAD WITHOUT CONTRAST TECHNIQUE: Multiplanar, multiecho pulse sequences of the brain and surrounding structures were obtained without intravenous contrast. COMPARISON:  Head CT 05/30/2017 and MRI 05/27/2017 FINDINGS: Brain: There are patchy acute infarcts in both occipital lobes which are new from the recent prior MRI and correlate with the hypodensities on today's earlier CT. No intracranial hemorrhage, mass, midline shift, or extra-axial fluid collection is identified. Elsewhere, T2 hyperintensities in the cerebral white matter bilaterally are unchanged from the prior MRI and are nonspecific but compatible with mild chronic small vessel ischemic disease. Generalized cerebral atrophy is not greater than expected for age. Vascular: Major intracranial vascular flow voids are preserved. Skull and upper cervical spine: Unremarkable bone marrow signal. Sinuses/Orbits: Unremarkable orbits. Paranasal sinuses and mastoid air cells are clear. Other: None. IMPRESSION: 1. Acute bilateral occipital lobe infarcts. 2. Mild chronic small vessel ischemic disease. Electronically Signed   By: Logan Bores M.D.   On: 05/30/2017 16:02        Scheduled Meds: .  stroke: mapping our early stages of recovery book   Does not apply Once  . acetaminophen  1,000 mg Oral TID  . allopurinol  100 mg Oral Daily  . anastrozole  1 mg Oral Daily  . aspirin EC  325 mg Oral Daily  . atorvastatin  40 mg Oral QPM  . cholecalciferol  1,000 Units Oral Daily  . cloNIDine  0.2 mg Oral BID  . ferrous sulfate  325 mg Oral Daily  . gabapentin  200 mg Oral TID  . heparin  5,000 Units Subcutaneous Q8H  . insulin aspart  0-9 Units Subcutaneous TID WC  . verapamil  240 mg Oral Daily   Continuous Infusions: . sodium chloride 75 mL/hr at 05/31/17 1109      LOS: 2 days    Time spent: 25 minutes    Edwin Dada, MD [facility] 06/01/2017, 11:04 AM     Pager [phone] --- please page though AMION:  [email or web address] If 7PM-7AM, please contact night-coverage

## 2017-06-02 ENCOUNTER — Encounter (HOSPITAL_COMMUNITY): Payer: Self-pay | Admitting: *Deleted

## 2017-06-02 ENCOUNTER — Other Ambulatory Visit (HOSPITAL_COMMUNITY): Payer: Medicare Other

## 2017-06-02 ENCOUNTER — Inpatient Hospital Stay (HOSPITAL_COMMUNITY): Payer: Medicare Other

## 2017-06-02 ENCOUNTER — Encounter (HOSPITAL_COMMUNITY)
Admission: EM | Disposition: A | Discharge status: Skilled Nursing Facility | Payer: Self-pay | Source: Home / Self Care | Attending: Family Medicine

## 2017-06-02 DIAGNOSIS — I639 Cerebral infarction, unspecified: Secondary | ICD-10-CM

## 2017-06-02 DIAGNOSIS — E118 Type 2 diabetes mellitus with unspecified complications: Secondary | ICD-10-CM

## 2017-06-02 DIAGNOSIS — I10 Essential (primary) hypertension: Secondary | ICD-10-CM

## 2017-06-02 DIAGNOSIS — C50411 Malignant neoplasm of upper-outer quadrant of right female breast: Secondary | ICD-10-CM

## 2017-06-02 DIAGNOSIS — N183 Chronic kidney disease, stage 3 (moderate): Secondary | ICD-10-CM

## 2017-06-02 DIAGNOSIS — D62 Acute posthemorrhagic anemia: Secondary | ICD-10-CM

## 2017-06-02 DIAGNOSIS — Z170 Estrogen receptor positive status [ER+]: Secondary | ICD-10-CM

## 2017-06-02 DIAGNOSIS — I5032 Chronic diastolic (congestive) heart failure: Secondary | ICD-10-CM

## 2017-06-02 DIAGNOSIS — H538 Other visual disturbances: Secondary | ICD-10-CM

## 2017-06-02 LAB — BASIC METABOLIC PANEL
Anion gap: 4 — ABNORMAL LOW (ref 5–15)
BUN: 44 mg/dL — ABNORMAL HIGH (ref 6–20)
CO2: 24 mmol/L (ref 22–32)
Calcium: 7.5 mg/dL — ABNORMAL LOW (ref 8.9–10.3)
Chloride: 114 mmol/L — ABNORMAL HIGH (ref 101–111)
Creatinine, Ser: 2.03 mg/dL — ABNORMAL HIGH (ref 0.44–1.00)
GFR calc Af Amer: 24 mL/min — ABNORMAL LOW (ref >60)
GFR calc non Af Amer: 21 mL/min — ABNORMAL LOW (ref >60)
Glucose, Bld: 127 mg/dL — ABNORMAL HIGH (ref 65–99)
Potassium: 4.8 mmol/L (ref 3.5–5.1)
Sodium: 142 mmol/L (ref 135–145)

## 2017-06-02 LAB — GLUCOSE, CAPILLARY
Glucose-Capillary: 116 mg/dL — ABNORMAL HIGH (ref 65–99)
Glucose-Capillary: 123 mg/dL — ABNORMAL HIGH (ref 65–99)
Glucose-Capillary: 119 mg/dL — ABNORMAL HIGH (ref 65–99)
Glucose-Capillary: 144 mg/dL — ABNORMAL HIGH (ref 65–99)
Glucose-Capillary: 118 mg/dL — ABNORMAL HIGH (ref 65–99)

## 2017-06-02 SURGERY — LOOP RECORDER INSERTION
Anesthesia: LOCAL

## 2017-06-02 SURGERY — INVASIVE LAB ABORTED CASE

## 2017-06-02 MED ORDER — MIDAZOLAM HCL 5 MG/ML IJ SOLN
INTRAMUSCULAR | Status: AC
  Filled 2017-06-02: qty 2

## 2017-06-02 MED ORDER — MIDAZOLAM HCL 10 MG/2ML IJ SOLN
INTRAMUSCULAR | Status: DC | PRN
  Administered 2017-06-02: 1 mg via INTRAVENOUS
  Administered 2017-06-02 (×2): 2 mg via INTRAVENOUS

## 2017-06-02 MED ORDER — FENTANYL CITRATE (PF) 100 MCG/2ML IJ SOLN
INTRAMUSCULAR | Status: DC | PRN
  Administered 2017-06-02 (×2): 25 ug via INTRAVENOUS

## 2017-06-02 MED ORDER — BUTAMBEN-TETRACAINE-BENZOCAINE 2-2-14 % EX AERO
INHALATION_SPRAY | CUTANEOUS | Status: DC | PRN
  Administered 2017-06-02: 2 via TOPICAL

## 2017-06-02 MED ORDER — FENTANYL CITRATE (PF) 100 MCG/2ML IJ SOLN
INTRAMUSCULAR | Status: AC
  Filled 2017-06-02: qty 2

## 2017-06-02 NOTE — Interval H&P Note (Signed)
History and Physical Interval Note:  06/02/2017 11:51 AM  Sabrina [name]  has presented today for surgery, with the diagnosis of stroke  The various methods of treatment have been discussed with the patient and family. After consideration of risks, benefits and other options for treatment, the patient has consented to  Procedure(s): TRANSESOPHAGEAL ECHOCARDIOGRAM (TEE) (N/A) as a surgical intervention .  The patient's history has been reviewed, patient examined, no change in status, stable for surgery.  I have reviewed the patient's chart and labs.  Questions were answered to the patient's satisfaction.     [name]

## 2017-06-02 NOTE — Progress Notes (Addendum)
STROKE TEAM PROGRESS NOTE   HISTORY OF PRESENT ILLNESS (per record) Sabrina [name] is an 84 y.o. female with a recent hospital admission on 3/25-3/27/2019 for frequent falls, HTN, HLD, Hx of DVT/PE - previously on Coumadin, Hx Breast Cancer 4 years ago, CHF and CKD who presents to the ED today with complaints of CP after ground level fall last night and now blurry vision.  MRI is positive for acute bilateral occipital infarcts. Neurology has been consulted for acute stroke.  The patient states she has had blurry vision for the past few days. She describes it as "double vision". She had her glasses checked yesterday and made another appointment to see her eye doctor on Monday April 1st. Last night she tripped on her walker and came to the ED today because she continues to have chest pain from hitting the walker. She states she is compliant with her ASA 81 mg and Lipitor 10 mg regime and takes them daily. She denies any new weakness or numbness. Denies any H/A or difficulty swallowing.  Date last known well: Unable to determine Time last known well: Unable to determine tPA Given: No: outside the window    SUBJECTIVE (INTERVAL HISTORY) Her family is  at bedside. She states she is improving.   TEE attempted but patient unable to swallow the probe  OBJECTIVE Temp:  [97.6 F (36.4 C)-98.5 F (36.9 C)] 98.3 F (36.8 C) (04/01 1309) Pulse Rate:  [54-96] 72 (04/01 1425) Cardiac Rhythm: Normal sinus rhythm (04/01 0700) Resp:  [12-21] 21 (04/01 1425) BP: (100-178)/(37-70) 100/38 (04/01 1450) SpO2:  [95 %-99 %] 97 % (04/01 1450)  CBC:  Recent Labs  Lab 05/26/17 2332 05/28/17 0001 05/30/17 1618 05/31/17 0524  WBC 11.1* 15.3*  --  9.2  NEUTROABS 8.1*  --  7.2  --   HGB 12.2 12.7  --  10.1*  HCT 36.9 39.6  --  31.4*  MCV 97.1 99.2  --  98.4  PLT 215 267  --  324    Basic Metabolic Panel:  Recent Labs  Lab 06/01/17 1033 06/02/17 0412  NA 140 142  K 4.2 4.8  CL 109 114*  CO2 22 24   GLUCOSE 205* 127*  BUN 47* 44*  CREATININE 2.07* 2.03*  CALCIUM 7.8* 7.5*    Lipid Panel:     Component Value Date/Time   CHOL 101 05/31/2017 0524   TRIG 109 05/31/2017 0524   HDL 40 (L) 05/31/2017 0524   CHOLHDL 2.5 05/31/2017 0524   VLDL 22 05/31/2017 0524   LDLCALC 39 05/31/2017 0524   HgbA1c:  Lab Results  Component Value Date   HGBA1C 7.4 (H) 05/27/2017   Urine Drug Screen: No results found for: LABOPIA, COCAINSCRNUR, LABBENZ, AMPHETMU, THCU, LABBARB  Alcohol Level No results found for: [facility]  IMAGING  Dg Ribs Unilateral W/chest Right 05/30/2017 IMPRESSION:  1. No visible rib fracture or intrathoracic injury.  2. Cholelithiasis.    Ct Head Wo Contrast 05/30/2017 IMPRESSION:  1. No definite acute intracranial abnormality. Apparent hypodensity within the right greater than left occipital lobes is favored artifactual, less likely due to infarct or posterior reversible encephalopathy syndrome. Correlate for visual symptoms or hypertensive urgency.    Ct Cervical Spine Wo Contrast 05/30/2017 IMPRESSION:  No acute cervical spine fracture. Mild-to-moderate degenerative disc disease in the lower cervical spine. Moderate to severe facet arthropathy throughout the cervical spine.    Mr Brain Wo Contrast 05/30/2017 IMPRESSION:  1. Acute bilateral occipital lobe infarcts.  2.  Mild chronic small vessel ischemic disease.    Transthoracic Echocardiogram  05/27/2017 Study Conclusions - Procedure narrative: Transthoracic echocardiography. Image   quality was poor. The study was technically difficult, as a   result of poor sound wave transmission. - Left ventricle: The cavity size was normal. Wall thickness was   increased in a pattern of mild LVH. There was mild concentric   hypertrophy. Systolic function was normal. The estimated ejection   fraction was in the range of 55% to 60%. Wall motion was normal;   there were no regional wall motion abnormalities. Doppler    parameters are consistent with abnormal left ventricular   relaxation (grade 1 diastolic dysfunction). - Aortic valve: There was mild stenosis. Valve area (VTI): 1.75   cm^2. Valve area (Vmax): 1.8 cm^2. Valve area (Vmean): 1.7 cm^2. - Mitral valve: Severely calcified annulus. Valve area by   continuity equation (using LVOT flow): 1.95 cm^2. - Atrial septum: No defect or patent foramen ovale was identified.   Bilateral Carotid Dopplers  05/31/2017 1-39% ICA stenosis. Vertebral artery flow is antegrade.     PHYSICAL EXAM Vitals:   06/02/17 1420 06/02/17 1425 06/02/17 1440 06/02/17 1450  BP: (!) 112/56 (!) 120/44 (!) 100/49 (!) 100/38  Pulse: (!) 57 72    Resp: 17 (!) 21    Temp:      TempSrc:      SpO2: 98% 98% 98% 97%  Weight:      Height:       Physical exam: Exam: Gen: Pleasant elderly Caucasian lady not in distress CV: RRR, no MRG. No Carotid Bruits. No peripheral edema, warm, nontender Lungs clear to auscultation bilaterally Neuro: Detailed Neurologic Exam  Speech:    Speech is normal Cognition:    The patient is oriented to person, month and year, follow commands but appears to have some baseline memory issues   Cranial Nerves:    The pupils are equal, round, and reactive to light.  Visual fields are impaired peripherally right side more than the left she reports blurred vision but can see in all quadrants. Extraocular movements are intact but reports diplopia. Trigeminal sensation is intact and the muscles of mastication are normal. The face is symmetric. The palate elevates in the midline. Hearing intact. Voice is normal. Shoulder shrug is normal. The tongue has normal motion without fasciculations.   Coordination:    No dysmetria noted  Motor Observation:    No asymmetry Tone:    Normal muscle tone.      Strength:    Strength is symmetrical and anti-gravity bilaterally, difficult exam due to non-cooperation slight agitation     Sensation: intact to LT      Reflex Exam:  DTR's:    Deep tendon reflexes in the upper and lower extremities are symmetrical bilaterally.   Toes:    The toes are equivocal bilaterally.   Clonus:    Clonus is absent.    ASSESSMENT/PLAN Ms. Sabrina [name] is a 84 y.o. female with history of frequent falls, DM, CKD, HTN, HLD, Hx of DVT/PE  presenting with . She did not receive IV t-PA due to late presentation.  Stroke:  Acute bilateral occipital lobe infarcts -likely embolic.  Resultant blurred vision   CT head - no definite acute intracranial abnormality.  MRI head - acute bilateral occipital lobe infarcts.   MRA head - not performed  Carotid Doppler - 1-39% ICA stenosis.  Vertebral artery flow is antegrade.   2D Echo - EF 55 -  60%. Lt [facility] -  no defect or patent foramen ovale was identified.  Needs TEE and loop  LDL - 39  HgbA1c - 7.4  VTE prophylaxis - [place] heparin Fall precautions Diet Heart Room service appropriate? Yes; Fluid consistency: Thin  aspirin 81 mg daily prior to admission, now on aspirin 325 mg daily  Patient counseled to be compliant with her antithrombotic medications  Ongoing aggressive stroke risk factor management  Therapy recommendations:  CIR recommended  Disposition:  Pending  Hypertension  Stable  Permissive hypertension (OK if < 220/120) but gradually normalize in 5-7 days.  Long-term BP goal normotensive  Hyperlipidemia  Lipid lowering medication PTA:  Lipitor 10 mg daily  LDL 39, goal < 70  Current lipid lowering medication: Lipitor 40 mg daily  Continue statin at discharge  Diabetes  HgbA1c 7.4, goal < 7.0  Uncontrolled  Other Stroke Risk Factors  Advanced age  ETOH use, advised to drink no more than 1 alcoholic beverage per day.  Obesity, Body mass index is 34.55 kg/m., recommend weight loss, diet and exercise as appropriate    Other Active Problems  Anemia  CKD   Plan / Recommendations   Stroke workup - complete except for TEE  / loop  Gradually normalize BP   Hospital day # 3  Personally examined patient and images, and have participated in and made any corrections needed to history, physical, neuro exam,assessment and plan as stated above.  I have personally obtained the history, evaluated lab date, reviewed imaging studies and agree with radiology interpretations. She presented with blurred vision and some diplopia due to bilateral occipital infarcts likely of embolic etiology. TEE was attempted today but unsuccessful. Recommend loop recorder implant for paroxysmal A. fib. Continue aspirin for stroke prevention and aggressive risk factor modification. Follow-up for that outpatient in the stroke clinic in 6 weeks. Greater than 50% time during this 25 minute visit was spent on counseling and coordination of care of your embolic strokes and discussing evaluation and treatment plan and answered questions. Discussed with Dr. Loleta Books. Stroke team will sign off. Kindly call for questions. Antony Contras, MD Medical Director [facility] Pager: [phone] 06/02/2017 5:06 PM    To contact Stroke Continuity provider, please refer to [email or web address] After hours, contact General Neurology

## 2017-06-02 NOTE — Consult Note (Signed)
Physical Medicine and Rehabilitation Consult Reason for Consult: Decreased functional mobility Referring Physician: [facility]   HPI: Sabrina [name] is a 84 y.o. right-handed female with history of invasive ductal breast cancer status post lumpectomy with radiation therapy and chemotherapy, stage III-IV CKD, hypertension, diabetes mellitus.  Per chart review and patient, patient lives with spouse.  Husband is reported to have a history of dementia and patient is attempting to arrange for his care.  Independent with assistive device.  She had been using a walker the last few weeks due to some right lower extremity pain felt to be due to sciatica and at baseline uses a cane.  Noted multiple falls.  She was able to complete her ADLs and drive short distances.  One level home with 2 steps to entry.  She has a 52 year old sister in the area with limited assistance.  Presented 05/27/2017 with noted fall striking the back of her head and reported some blurred vision.  No loss of consciousness.  Cranial CT scan reviewed, unremarkable for acute intracranial abnormality.  Per report, small left vertex scalp hematoma without skull fracture or acute intracranial abnormality.  MRI showing acute b/l occipital infarcts. Troponin mildly elevated 0.9-0.15.  Cardiology services consulted, suspect troponin elevated due to demand ischemia, doubt significant MI.  Echocardiogram with ejection fraction of 79% grade 1 diastolic dysfunction.  Therapy evaluation completed 05/28/2017 recommendations of skilled nursing facility of which patient refused was discharged to home minimal guard assist 05/30/2017.  Patient readmitted same day 05/30/2017 with reported nonspecific chest pain and ground-level fall.  Repeat CT/MRI of the brain showed acute bilateral occipital lobe infarction.  Patient did not receive TPA.  Neurology consulted currently maintained on aspirin for CVA prophylaxis.  Subcutaneous heparin for DVT prophylaxis.  Await plan  for TEE and possible loop recorder placement.  Occupational therapy evaluation completed with recommendations of physical medicine rehab consult.   Review of Systems  Constitutional: Negative for chills and fever.  HENT: Negative for hearing loss.   Eyes: Negative for blurred vision.  Respiratory: Negative for cough and shortness of breath.   Gastrointestinal: Positive for constipation. Negative for nausea and vomiting.  Musculoskeletal: Positive for falls and myalgias.  Skin: Negative for rash.  All other systems reviewed and are negative.  Past Medical History:  Diagnosis Date  . Arthritis    neck  . Breast cancer ([place]) 03/2013   right  . Chronic kidney disease (CKD), stage III (moderate) ([facility])    nephrologist, Dr. [name]  . Dental crowns present   . Heart murmur    no known problems; states did not know she had murmur until age 20  . High cholesterol   . Hypertension    fluctuates, especially when stressed; has been on med. > 20 yr.  . Immature cataract   . Non-insulin dependent type 2 diabetes mellitus ([place])   . Radiation 09/06/13-10/20/13   Right Breast Cancer  . Wears partial dentures    lower   Past Surgical History:  Procedure Laterality Date  . AXILLARY LYMPH NODE DISSECTION Right 03/30/2013   Procedure: AXILLARY LYMPH NODE DISSECTION;  Surgeon: Rolm Bookbinder, MD;  Location: [place];  Service: General;  Laterality: Right;  . BREAST LUMPECTOMY WITH NEEDLE LOCALIZATION Right 03/30/2013   Procedure: BREAST LUMPECTOMY WITH NEEDLE LOCALIZATION;  Surgeon: Rolm Bookbinder, MD;  Location: [place];  Service: General;  Laterality: Right;  . BREAST SURGERY Right 11/1958   right breast biopsy benign  . DILATION  AND CURETTAGE OF UTERUS    . PORTACATH PLACEMENT N/A 04/15/2013   Procedure: INSERTION PORT-A-CATH;  Surgeon: Rolm Bookbinder, MD;  Location: [place];  Service: General;  Laterality: N/A;  . RE-EXCISION OF BREAST CANCER,SUPERIOR MARGINS  Right 04/15/2013   Procedure: RE-EXCISION OF RIGHT BREAST  MARGINS;  Surgeon: Rolm Bookbinder, MD;  Location: [place];  Service: General;  Laterality: Right;  . TONSILLECTOMY     as a child   Family History  Problem Relation Age of Onset  . Pneumonia Mother   . Heart attack Father   . Breast cancer Other 66       niece  . Breast cancer Other 19       niece   Social History:  reports that she has never smoked. She has never used smokeless tobacco. She reports that she drinks alcohol. She reports that she does not use drugs. Allergies: No Known Allergies Medications Prior to Admission  Medication Sig Dispense Refill  . allopurinol (ZYLOPRIM) 100 MG tablet Take 100 mg by mouth daily.     [name] anastrozole (ARIMIDEX) 1 MG tablet Take 1 tablet (1 mg total) by mouth daily. 90 tablet 12  . aspirin EC 81 MG tablet Take 1 tablet (81 mg total) by mouth daily.    [name] atorvastatin (LIPITOR) 10 MG tablet Take 10 mg by mouth every evening.     [name] BIOTIN 5000 PO Take 1 tablet by mouth daily.    . cholecalciferol (VITAMIN D) 1000 UNITS tablet Take 1,000 Units by mouth daily.    . cloNIDine (CATAPRES) 0.2 MG tablet Take 0.2 mg by mouth 2 (two) times daily.    . colchicine 0.6 MG tablet Take 0.6 mg by mouth as needed (GOUT).     [name] docusate sodium (COLACE) 100 MG capsule Take 100 mg by mouth 2 (two) times daily as needed for mild constipation. Reported on 03/21/2015    . furosemide (LASIX) 40 MG tablet TAKE 1 TABLET IN AM AND 1/2 TABLET IN THE PM. (Patient taking differently: TAKE 40 mg  TABLET EVERY OTHER DAY) 60 tablet 0  . gabapentin (NEURONTIN) 100 MG capsule Take 200 mg by mouth 3 (three) times daily.   0  . glipiZIDE (GLUCOTROL) 2.5 mg TABS tablet Take 0.5 tablets (2.5 mg total) by mouth 2 (two) times daily before a meal. (Patient taking differently: Take 2.5 mg by mouth See admin instructions. Take 2.5 in the morning and 1.25) 60 tablet 1  . IRON PO Take 65 mg by mouth daily.    [name]  losartan (COZAAR) 25 MG tablet Take 25 mg by mouth daily.     . traMADol (ULTRAM) 50 MG tablet Take 1 tablet (50 mg total) by mouth 2 (two) times daily. (Patient taking differently: Take 50 mg by mouth See admin instructions. Take 50 mg in the morning and take 50 mg  as needed in the evening) 60 tablet 1  . verapamil (VERELAN PM) 240 MG 24 hr capsule Take 240 mg by mouth daily.     . vitamin C (ASCORBIC ACID) 500 MG tablet Take 500 mg by mouth daily.      Home: Home Living Family/patient expects to be discharged to:: Private residence Living Arrangements: Spouse/significant other Available Help at Discharge: Family Type of Home: House Home Access: Stairs to enter Technical brewer of Steps: 2-back Entrance Stairs-Rails: None Home Layout: One level Bathroom Shower/Tub: Multimedia programmer: [place]: Kasandra Knudsen - single point, Environmental consultant -  2 wheels  Lives With: Spouse  Functional History: Prior Function Level of Independence: Independent with assistive device(s) Comments: using walker last few weeks due to R LE pain. at baseline, uses cane. Spouse has dementia. Multiple falls at home. Performs ADLs, IADLs, and driving (stopped driving when RLE started hurting). Has someone come in to clean Functional Status:  Mobility: Bed Mobility Overal bed mobility: Needs Assistance Bed Mobility: Supine to Sit Rolling: Min guard Sidelying to sit: Min assist, HOB elevated Supine to sit: Min assist, HOB elevated, Min guard General bed mobility comments: Min Guard for bringing BLEs to EOB and elevating trunk. Pt requiring Min A to bring right hip towards EOB Transfers Overall transfer level: Needs assistance Equipment used: Rolling walker (2 wheeled), None Transfers: Sit to/from Stand, Stand Pivot Transfers Sit to Stand: Min assist Stand pivot transfers: Min assist General transfer comment: Min A for balance in standing. VCs for hand placement. Requiring Max cues for  sequencing due to visual deficits Ambulation/Gait Ambulation/Gait assistance: Min assist Ambulation Distance (Feet): 12 Feet Assistive device: Rolling walker (2 wheeled) Gait Pattern/deviations: Step-through pattern, Decreased stride length, Trunk flexed General Gait Details: Requires Min A for directional cues due to impaired vision; use of RW for support. HR up to 128 bpm. Gait velocity: decreased    ADL: ADL Overall ADL's : Needs assistance/impaired Eating/Feeding: Minimal assistance, Sitting Eating/Feeding Details (indicate cue type and reason): Pt requiring Min A to prepare food due to vision deficits.  Grooming: Wash/dry hands, Standing, Cueing for sequencing, Minimal assistance Grooming Details (indicate cue type and reason): Pt performing hand hygiene at sink with Min A for hand over hand A to locate objects in sick such as soap, faucet, and towel.  Upper Body Bathing: Sitting, Minimal assistance Lower Body Bathing: Sit to/from stand, Moderate assistance Upper Body Dressing : Minimal assistance, Sitting Lower Body Dressing: Moderate assistance, Sit to/from stand Toilet Transfer: BSC, RW, Maximal assistance, Ambulation, Cueing for sequencing, Minimal assistance Toilet Transfer Details (indicate cue type and reason): Pt performing funcitonal mobility to bathroom. However, requiring Max A to guide and manage walker due to vision deficits. Pt unable to navigate within room. Pt requiring Min A for balance.  Toileting- Water quality scientist and Hygiene: Sit to/from stand, Cueing for sequencing, Minimal assistance Toileting - Clothing Manipulation Details (indicate cue type and reason): Pt requiring Min A for managing gown during peri care Functional mobility during ADLs: Maximal assistance, Rolling walker, Minimal assistance(Max A for vision deficits) General ADL Comments: Pt with signficant vision deficits impacting her functional performance. Pt requiring Max cues for navigating around  room and locating objects during ADLs. Pt is at a HIGH risk for falls. Pt unable to complete ADLs and functional mobility without assistance  Cognition: Cognition Overall Cognitive Status: Within Functional Limits for tasks assessed Orientation Level: Oriented X4 Cognition Arousal/Alertness: Awake/alert Behavior During Therapy: WFL for tasks assessed/performed Overall Cognitive Status: Within Functional Limits for tasks assessed General Comments: Able to follow commands and answer questions  Blood pressure (!) 137/47, pulse 96, temperature 97.6 F (36.4 C), temperature source Oral, resp. rate 18, height 4\' 11"  (1.499 m), weight 77.6 kg (171 lb 1.2 oz), SpO2 99 %. Physical Exam  Vitals reviewed. Constitutional: She is oriented to person, place, and time. She appears well-developed.  Frail  HENT:  Head: Normocephalic and atraumatic.  Eyes: EOM are normal. Right eye exhibits no discharge. Left eye exhibits no discharge.  Neck: Normal range of motion. Neck supple. No thyromegaly present.  Cardiovascular: Normal rate,  regular rhythm and normal heart sounds.  Respiratory: Effort normal and breath sounds normal. No respiratory distress.  GI: Soft. Bowel sounds are normal. She exhibits no distension.  Musculoskeletal:  No edema or tenderness in extremities  Neurological: She is alert and oriented to person, place, and time.  Motor: B/l UE: 4-4+/5 proximal to distal LUE dysmetria RLE: HF, KE 3+/5, ADF 4/5 LLE: HF, KE 4-/5, ADF 4/5  Skin: Skin is warm and dry.  Psychiatric: She has a normal mood and affect. Her behavior is normal.    Results for orders placed or performed during the hospital encounter of 05/30/17 (from the past 24 hour(s))  Glucose, capillary     Status: Abnormal   Collection Time: 06/01/17  5:55 AM  Result Value Ref Range   Glucose-Capillary 159 (H) 65 - 99 mg/dL   Comment 1 Notify RN   Basic metabolic panel     Status: Abnormal   Collection Time: 06/01/17 10:33 AM    Result Value Ref Range   Sodium 140 135 - 145 mmol/L   Potassium 4.2 3.5 - 5.1 mmol/L   Chloride 109 101 - 111 mmol/L   CO2 22 22 - 32 mmol/L   Glucose, Bld 205 (H) 65 - 99 mg/dL   BUN 47 (H) 6 - 20 mg/dL   Creatinine, Ser 2.07 (H) 0.44 - 1.00 mg/dL   Calcium 7.8 (L) 8.9 - 10.3 mg/dL   GFR calc non Af Amer 20 (L) >60 mL/min   GFR calc Af Amer 23 (L) >60 mL/min   Anion gap 9 5 - 15  Glucose, capillary     Status: Abnormal   Collection Time: 06/01/17 11:33 AM  Result Value Ref Range   Glucose-Capillary 178 (H) 65 - 99 mg/dL   Comment 1 Notify RN    Comment 2 Document in Chart   Glucose, capillary     Status: Abnormal   Collection Time: 06/01/17  4:10 PM  Result Value Ref Range   Glucose-Capillary 151 (H) 65 - 99 mg/dL  Glucose, capillary     Status: Abnormal   Collection Time: 06/01/17 10:04 PM  Result Value Ref Range   Glucose-Capillary 109 (H) 65 - 99 mg/dL  Basic metabolic panel     Status: Abnormal   Collection Time: 06/02/17  4:12 AM  Result Value Ref Range   Sodium 142 135 - 145 mmol/L   Potassium 4.8 3.5 - 5.1 mmol/L   Chloride 114 (H) 101 - 111 mmol/L   CO2 24 22 - 32 mmol/L   Glucose, Bld 127 (H) 65 - 99 mg/dL   BUN 44 (H) 6 - 20 mg/dL   Creatinine, Ser 2.03 (H) 0.44 - 1.00 mg/dL   Calcium 7.5 (L) 8.9 - 10.3 mg/dL   GFR calc non Af Amer 21 (L) >60 mL/min   GFR calc Af Amer 24 (L) >60 mL/min   Anion gap 4 (L) 5 - 15   No results found.  Assessment/Plan: Diagnosis: B/l occipital infarcts. Labs and images independently reviewed.  Records reviewed and summated above. Stroke: Continue secondary stroke prophylaxis and Risk Factor Modification listed below:   Antiplatelet therapy:   Blood Pressure Management:  Continue current medication with prn's with permisive HTN per primary team Statin Agent:   Diabetes management:   Motor recovery: Fluoxetine  1. Does the need for close, 24 hr/day medical supervision in concert with the patient's rehab needs make it  unreasonable for this patient to be served in a less intensive setting?  Potentially  2. Co-Morbidities requiring supervision/potential complications: invasive ductal breast cancer status post lumpectomy with radiation therapy and chemotherapy (cont to monitor), stage III-IV CKD (avoid nephrotoxic meds), HTN (monitor and provide prns in accordance with increased physical exertion and pain), diabetes mellitus (Monitor in accordance with exercise and adjust meds as necessary), diastolic dysfunction (monitor for signs/symptoms of fluid overload), ABLA (transfuse if necessary to ensure appropriate perfusion for increased activity tolerance) 3. Due to safety, disease management and patient education, does the patient require 24 hr/day rehab nursing? Yes 4. Does the patient require coordinated care of a physician, rehab nurse, PT (1-2 hrs/day, 5 days/week) and OT (1-2 hrs/day, 5 days/week) to address physical and functional deficits in the context of the above medical diagnosis(es)? Yes Addressing deficits in the following areas: balance, endurance, locomotion, strength, transferring, bathing, dressing, toileting and psychosocial support 5. Can the patient actively participate in an intensive therapy program of at least 3 hrs of therapy per day at least 5 days per week? Yes 6. The potential for patient to make measurable gains while on inpatient rehab is excellent 7. Anticipated functional outcomes upon discharge from inpatient rehab are supervision  with PT, supervision with OT, n/a with SLP. 8. Estimated rehab length of stay to reach the above functional goals is: 15-19 days. 9. Anticipated D/C setting: Other 10. Anticipated post D/C treatments: SNF 11. Overall Rehab/Functional Prognosis: good  RECOMMENDATIONS: This patient's condition is appropriate for continued rehabilitative care in the following setting: Likely SNF due to lack of caregiver support with PM&R outpatient follow up.  Patient has agreed to  participate in recommended program. Potentially Note that insurance prior authorization may be required for reimbursement for recommended care.  Comment: Rehab Admissions Coordinator to follow up.  Delice Lesch, MD, ABPMR Lavon Paganini Angiulli, PA-C 06/02/2017

## 2017-06-02 NOTE — Progress Notes (Signed)
OT Cancellation    06/02/17 1300  OT Visit Information  Last OT Received On 06/02/17  Reason Eval/Treat Not Completed Patient at procedure or test/ unavailable  [facility], OT/L  [phone] 06/02/2017

## 2017-06-02 NOTE — H&P (View-Only) (Signed)
PROGRESS NOTE    Sabrina [name]  MRN:[id number] DOB: 08/30/1926 DOA: 05/30/2017 PCP: Stoneking, Hal, MD      Brief Narrative:  Sabrina [name] is a 84 y.o. F with CKD stage IV, HTN, BrCA, and DM who presents with falls and new onset blindness.  The patient blurry vision several days ago, which she describes as "double vision".  She had her glasses checked the day before admission and made another appointment to see her eye doctor on Monday April 1st but the night before admission she tripped on her walker and came to the ED because of chest pain from hitting the walker.      Assessment & Plan:  Stroke, bilateral occipital, likely embolic A1c 7.4%, LDL 39.  MRI shows "patchy acute infarcts in both occipital lobes" new from MRI 4 days ago.  Hemoglobin A1c near goal, LDL 39.  MRI shows bilateral occipital lobe infarcts. -Continue aspirin and statin  -Continue clonidine and verapamil -Hold losartan, furosemide -Consult neurology, appreciate recommendations -TEE and loop recorder planned today      Acute kidney injury on chronic kidney disease stage IV FenA 0.7%, suggesting pre-renal injury.   Creatinine improevd with fluids to 2.0, stable.  UA with no casts.  Leukocytes noted, no dysuria, urgency. -Daily BMP -Stop fluids -Avoid nephrotoxins and hypotension  Chest pain This is from falling on her walker.  CXR and dedicated rib films negative. -Tramadol 50 mg PRN for pain  Hypertension Good control today -Continue verapamil clonidine -Hold furosemide, Cozaar for now -Labetalol PRN for severe BP elevation, otherwise permissive HTN for now  History breast cancer  -Continue Arimidex  History gout Quiescent -Continue allopurinol  Diabetes HgbA1c 7.4%.  Blood sugars well controlled. -Continue SSI    DVT prophylaxis: Lovenox Code Status: FULL Family Communication: None present MDM and disposition Plan: Labs and imaging reports were reviewed.  Repeat labs ordered, medication  management as above, she presented with bilateral embolic stroke, her work-up is continuing with TEE and loop recorder implantation today.  Likely after that she will be medically able to discharge.  PT recommended inpatient rehab, insurance authorization and rehab MD evaluation is pending.         Consultants:   Neurology  Procedures:   CT head and cspine 3/29 IMPRESSION: 1. No definite acute intracranial abnormality. Apparent hypodensity within the right greater than left occipital lobes is favored artifactual, less likely due to infarct or posterior reversible encephalopathy syndrome. Correlate for visual symptoms or hypertensive urgency. 2. No acute cervical spine fracture. Mild-to-moderate degenerative disc disease in the lower cervical spine. Moderate to severe facet arthropathy throughout the cervical spine.   Rib xrays 3/29 IMPRESSION: 1. No visible rib fracture or intrathoracic injury. 2. Cholelithiasis.    MRI brain 3/29 IMPRESSION: 1. Acute bilateral occipital lobe infarcts. 2. Mild chronic small vessel ischemic disease     Antimicrobials:   None    Subjective: Still blind, no focal weakness, slurred speech, change in mentation, numbness.  She still complains of right-sided sciatica, worse with movement.  She also has some persistent right-sided chest pain from her fall in her walker.  Objective: Vitals:   06/01/17 2158 06/02/17 0009 06/02/17 0400 06/02/17 0743  BP: (!) 172/54 (!) 121/50 (!) 137/47 135/70  Pulse:  69 96 74  Resp:  18 18 18  Temp:  98.4 F (36.9 C) 97.6 F (36.4 C) 97.6 F (36.4 C)  TempSrc:  Oral Oral Oral  SpO2:  99%  96%  Weight:        Height:        Intake/Output Summary (Last 24 hours) at 06/02/2017 0929 Last data filed at 06/02/2017 0622 Gross per 24 hour  Intake 220 ml  Output 125 ml  Net 95 ml   Filed Weights   05/30/17 2355  Weight: 77.6 kg (171 lb 1.2 oz)    Examination: General appearance: Elderly female,  lying in bed, interactive, no obvious distress. HEENT: Lids and lashes normal, glasses in place, there is a piece of scotch tape over her left lens, hearing is normal, oropharynx is tacky dry, no oral lesions.     Skin: The skin is warm and dry without rashes or lesions, no jaundice. Cardiac: RRR, no murmurs, no lower extremity edema. Respiratory: Respiratory effort normal, lungs clear without rales or wheezes. Abdomen: Abdomen soft.  No TTP. No ascites, distension, hepatosplenomegaly.   MSK: No deformities or effusions. Neuro: Vision is impaired, otherwise cranial nerves are symmetric.  Her sensation is intact light touch.  Her strength is normal to opposition in both upper and lower extremities symmetrically.    Psych: Orientated to person, place, time and situation, affect normal, attention normal, judgment and insight normal, no hallucinations.    Data Reviewed: I have personally reviewed following labs and imaging studies:  CBC: Recent Labs  Lab 05/26/17 2332 05/28/17 0001 05/30/17 1618 05/31/17 0524  WBC 11.1* 15.3*  --  9.2  NEUTROABS 8.1*  --  7.2  --   HGB 12.2 12.7  --  10.1*  HCT 36.9 39.6  --  31.4*  MCV 97.1 99.2  --  98.4  PLT 215 267  --  194   Basic Metabolic Panel: Recent Labs  Lab 05/26/17 2332 05/28/17 0001 05/31/17 0524 06/01/17 1033 06/02/17 0412  NA 142 144 136 140 142  K 4.2 4.0 4.1 4.2 4.8  CL 104 106 101 109 114*  CO2 26 25 22 22 24  GLUCOSE 195* 231* 178* 205* 127*  BUN 62* 42* 76* 47* 44*  CREATININE 1.85* 1.68* 3.52* 2.07* 2.03*  CALCIUM 10.0 9.5 8.2* 7.8* 7.5*   GFR: Estimated Creatinine Clearance: 17.2 mL/min (A) (by C-G formula based on SCr of 2.03 mg/dL (H)). Liver Function Tests: Recent Labs  Lab 05/31/17 0524  AST 33  ALT 30  ALKPHOS 67  BILITOT 0.9  PROT 5.7*  ALBUMIN 2.9*   No results for input(s): LIPASE, AMYLASE in the last 168 hours. No results for input(s): AMMONIA in the last 168 hours. Coagulation Profile: No  results for input(s): INR, PROTIME in the last 168 hours. Cardiac Enzymes: Recent Labs  Lab 05/26/17 2332 05/27/17 0331 05/27/17 1340 05/27/17 1809 05/28/17 0001  TROPONINI 0.09* 0.09* 0.15* 0.14* 0.15*   BNP (last 3 results) No results for input(s): PROBNP in the last 8760 hours. HbA1C: No results for input(s): HGBA1C in the last 72 hours. CBG: Recent Labs  Lab 06/01/17 0555 06/01/17 1133 06/01/17 1610 06/01/17 2204 06/02/17 0559  GLUCAP 159* 178* 151* 109* 116*   Lipid Profile: Recent Labs    05/31/17 0524  CHOL 101  HDL 40*  LDLCALC 39  TRIG 109  CHOLHDL 2.5   Thyroid Function Tests: No results for input(s): TSH, T4TOTAL, FREET4, T3FREE, THYROIDAB in the last 72 hours. Anemia Panel: No results for input(s): VITAMINB12, FOLATE, FERRITIN, TIBC, IRON, RETICCTPCT in the last 72 hours. Urine analysis:    Component Value Date/Time   COLORURINE YELLOW 05/31/2017 0905   APPEARANCEUR CLEAR 05/31/2017 0905   LABSPEC 1.012 05/31/2017 0905     PHURINE 6.0 05/31/2017 0905   GLUCOSEU NEGATIVE 05/31/2017 0905   HGBUR SMALL (A) 05/31/2017 0905   BILIRUBINUR NEGATIVE 05/31/2017 0905   KETONESUR NEGATIVE 05/31/2017 0905   PROTEINUR NEGATIVE 05/31/2017 0905   UROBILINOGEN 0.2 09/13/2013 0523   NITRITE NEGATIVE 05/31/2017 0905   LEUKOCYTESUR LARGE (A) 05/31/2017 0905   Sepsis Labs: @LABRCNTIP(procalcitonin:4,lacticacidven:4)  )No results found for this or any previous visit (from the past 240 hour(s)).       Radiology Studies: No results found.      Scheduled Meds: .  stroke: mapping our early stages of recovery book   Does not apply Once  . acetaminophen  1,000 mg Oral TID  . allopurinol  100 mg Oral Daily  . anastrozole  1 mg Oral Daily  . aspirin EC  325 mg Oral Daily  . atorvastatin  40 mg Oral QPM  . cholecalciferol  1,000 Units Oral Daily  . cloNIDine  0.2 mg Oral BID  . ferrous sulfate  325 mg Oral Daily  . gabapentin  200 mg Oral TID  . heparin   5,000 Units Subcutaneous Q8H  . insulin aspart  0-9 Units Subcutaneous TID WC  . verapamil  240 mg Oral Daily   Continuous Infusions:    LOS: 3 days    Time spent: 25 minutes    Lirio Bach P [place] Flight, MD [facility] 06/02/2017, 9:29 AM     Pager [phone] --- please page though AMION:  [email or web address] Password [email or web address] If 7PM-7AM, please contact night-coverage    

## 2017-06-02 NOTE — Progress Notes (Signed)
[facility]  Patient Status: Active (receiving services up to time of hospitalization)  [facility] is providing the following services: RN, PT, OT and MSW  Patient readmitted to the Hospital prior to start of services.  If patient discharges after hours, please call [phone].   Sabrina [name] 06/02/2017, 9:53 AM

## 2017-06-02 NOTE — Progress Notes (Signed)
PROGRESS NOTE    Sabrina [name]  JKD:[id number] DOB: 01-May-1926 DOA: 05/30/2017 PCP: [name], MD      Brief Narrative:  Sabrina [name] is a 84 y.o. F with CKD stage IV, HTN, BrCA, and DM who presents with falls and new onset blindness.  The patient blurry vision several days ago, which she describes as "double vision".  She had her glasses checked the day before admission and made another appointment to see her eye doctor on Monday April 1st but the night before admission she tripped on her walker and came to the ED because of chest pain from hitting the walker.      Assessment & Plan:  Stroke, bilateral occipital, likely embolic K9X 8.3%, LDL 39.  MRI shows "patchy acute infarcts in both occipital lobes" new from MRI 4 days ago.  Hemoglobin A1c near goal, LDL 39.  MRI shows bilateral occipital lobe infarcts. -Continue aspirin and statin  -Continue clonidine and verapamil -Hold losartan, furosemide -Consult neurology, appreciate recommendations -TEE and loop recorder planned today      Acute kidney injury on chronic kidney disease stage IV FenA 0.7%, suggesting pre-renal injury.   Creatinine improevd with fluids to 2.0, stable.  UA with no casts.  Leukocytes noted, no dysuria, urgency. -Daily BMP -Stop fluids -Avoid nephrotoxins and hypotension  Chest pain This is from falling on her walker.  CXR and dedicated rib films negative. -Tramadol 50 mg PRN for pain  Hypertension Good control today -Continue verapamil clonidine -Hold furosemide, Cozaar for now -Labetalol PRN for severe BP elevation, otherwise permissive HTN for now  History breast cancer  -Continue Arimidex  History gout Quiescent -Continue allopurinol  Diabetes HgbA1c 7.4%.  Blood sugars well controlled. -Continue SSI    DVT prophylaxis: Lovenox Code Status: FULL Family Communication: None present MDM and disposition Plan: Labs and imaging reports were reviewed.  Repeat labs ordered, medication  management as above, she presented with bilateral embolic stroke, her work-up is continuing with TEE and loop recorder implantation today.  Likely after that she will be medically able to discharge.  PT recommended inpatient rehab, insurance authorization and rehab MD evaluation is pending.         Consultants:   Neurology  Procedures:   CT head and cspine 3/29 IMPRESSION: 1. No definite acute intracranial abnormality. Apparent hypodensity within the right greater than left occipital lobes is favored artifactual, less likely due to infarct or posterior reversible encephalopathy syndrome. Correlate for visual symptoms or hypertensive urgency. 2. No acute cervical spine fracture. Mild-to-moderate degenerative disc disease in the lower cervical spine. Moderate to severe facet arthropathy throughout the cervical spine.   Rib xrays 3/29 IMPRESSION: 1. No visible rib fracture or intrathoracic injury. 2. Cholelithiasis.    MRI brain 3/29 IMPRESSION: 1. Acute bilateral occipital lobe infarcts. 2. Mild chronic small vessel ischemic disease     Antimicrobials:   None    Subjective: Still blind, no focal weakness, slurred speech, change in mentation, numbness.  She still complains of right-sided sciatica, worse with movement.  She also has some persistent right-sided chest pain from her fall in her walker.  Objective: Vitals:   06/01/17 2158 06/02/17 0009 06/02/17 0400 06/02/17 0743  BP: (!) 172/54 (!) 121/50 (!) 137/47 135/70  Pulse:  69 96 74  Resp:  _0 Temp:  98.4 F (36.9 C) 97.6 F (36.4 C) 97.6 F (36.4 C)  TempSrc:  Oral Oral Oral  SpO2:  99%  96%  Weight:  Height:        Intake/Output Summary (Last 24 hours) at 06/02/2017 0929 Last data filed at 06/02/2017 0347 Gross per 24 hour  Intake 220 ml  Output 125 ml  Net 95 ml   Filed Weights   05/30/17 2355  Weight: 77.6 kg (171 lb 1.2 oz)    Examination: General appearance: Elderly female,  lying in bed, interactive, no obvious distress. HEENT: Lids and lashes normal, glasses in place, there is a piece of scotch tape over her left lens, hearing is normal, oropharynx is tacky dry, no oral lesions.     Skin: The skin is warm and dry without rashes or lesions, no jaundice. Cardiac: RRR, no murmurs, no lower extremity edema. Respiratory: Respiratory effort normal, lungs clear without rales or wheezes. Abdomen: Abdomen soft.  No TTP. No ascites, distension, hepatosplenomegaly.   MSK: No deformities or effusions. Neuro: Vision is impaired, otherwise cranial nerves are symmetric.  Her sensation is intact light touch.  Her strength is normal to opposition in both upper and lower extremities symmetrically.    Psych: Orientated to person, place, time and situation, affect normal, attention normal, judgment and insight normal, no hallucinations.    Data Reviewed: I have personally reviewed following labs and imaging studies:  CBC: Recent Labs  Lab 05/26/17 2332 05/28/17 0001 05/30/17 1618 05/31/17 0524  WBC 11.1* 15.3*  --  9.2  NEUTROABS 8.1*  --  7.2  --   HGB 12.2 12.7  --  10.1*  HCT 36.9 39.6  --  31.4*  MCV 97.1 99.2  --  98.4  PLT 215 267  --  425   Basic Metabolic Panel: Recent Labs  Lab 05/26/17 2332 05/28/17 0001 05/31/17 0524 06/01/17 1033 06/02/17 0412  NA 142 144 136 140 142  K 4.2 4.0 4.1 4.2 4.8  CL 104 106 101 109 114*  CO2 _0 GLUCOSE 195* 231* 178* 205* 127*  BUN 62* 42* 76* 47* 44*  CREATININE 1.85* 1.68* 3.52* 2.07* 2.03*  CALCIUM 10.0 9.5 8.2* 7.8* 7.5*   GFR: Estimated Creatinine Clearance: 17.2 mL/min (A) (by C-G formula based on SCr of 2.03 mg/dL (H)). Liver Function Tests: Recent Labs  Lab 05/31/17 0524  AST 33  ALT 30  ALKPHOS 67  BILITOT 0.9  PROT 5.7*  ALBUMIN 2.9*   No results for input(s): LIPASE, AMYLASE in the last 168 hours. No results for input(s): AMMONIA in the last 168 hours. Coagulation Profile: No  results for input(s): INR, PROTIME in the last 168 hours. Cardiac Enzymes: Recent Labs  Lab 05/26/17 2332 05/27/17 0331 05/27/17 1340 05/27/17 1809 05/28/17 0001  TROPONINI 0.09* 0.09* 0.15* 0.14* 0.15*   BNP (last 3 results) No results for input(s): PROBNP in the last 8760 hours. HbA1C: No results for input(s): HGBA1C in the last 72 hours. CBG: Recent Labs  Lab 06/01/17 0555 06/01/17 1133 06/01/17 1610 06/01/17 2204 06/02/17 0559  GLUCAP 159* 178* 151* 109* 116*   Lipid Profile: Recent Labs    05/31/17 0524  CHOL 101  HDL 40*  LDLCALC 39  TRIG 109  CHOLHDL 2.5   Thyroid Function Tests: No results for input(s): TSH, T4TOTAL, FREET4, T3FREE, THYROIDAB in the last 72 hours. Anemia Panel: No results for input(s): VITAMINB12, FOLATE, FERRITIN, TIBC, IRON, RETICCTPCT in the last 72 hours. Urine analysis:    Component Value Date/Time   COLORURINE YELLOW 05/31/2017 [place] 05/31/2017 0905   LABSPEC 1.012 05/31/2017 0905  PHURINE 6.0 05/31/2017 0905   GLUCOSEU NEGATIVE 05/31/2017 0905   HGBUR SMALL (A) 05/31/2017 0905   BILIRUBINUR NEGATIVE 05/31/2017 0905   KETONESUR NEGATIVE 05/31/2017 0905   PROTEINUR NEGATIVE 05/31/2017 0905   UROBILINOGEN 0.2 09/13/2013 0523   NITRITE NEGATIVE 05/31/2017 0905   LEUKOCYTESUR LARGE (A) 05/31/2017 0905   Sepsis Labs: _0 (procalcitonin:4,lacticacidven:4)  )No results found for this or any previous visit (from the past 240 hour(s)).       Radiology Studies: No results found.      Scheduled Meds: .  stroke: mapping our early stages of recovery book   Does not apply Once  . acetaminophen  1,000 mg Oral TID  . allopurinol  100 mg Oral Daily  . anastrozole  1 mg Oral Daily  . aspirin EC  325 mg Oral Daily  . atorvastatin  40 mg Oral QPM  . cholecalciferol  1,000 Units Oral Daily  . cloNIDine  0.2 mg Oral BID  . ferrous sulfate  325 mg Oral Daily  . gabapentin  200 mg Oral TID  . heparin   5,000 Units Subcutaneous Q8H  . insulin aspart  0-9 Units Subcutaneous TID WC  . verapamil  240 mg Oral Daily   Continuous Infusions:    LOS: 3 days    Time spent: 25 minutes    Edwin Dada, MD [facility] 06/02/2017, 9:29 AM     Pager [phone] --- please page though AMION:  [email or web address] If 7PM-7AM, please contact night-coverage

## 2017-06-02 NOTE — Progress Notes (Signed)
Physical Therapy Treatment Patient Details Name: Sabrina [name] MRN: [id number] DOB: 05/02/1926 Today's Date: 06/02/2017    History of Present Illness Patient is a 84 y/o female who was recently admitted 3/25-3/27 due to multiple falls now presents with another fall at home, CP and blurry vision. Brain MRI-bilateral occipital infarcts. PMH includes DM, breast ca, PE, DVT, rt hip pain, CKD.    PT Comments    Patient progressing slowly towards PT goals. Seems to have improved vision from prior session with taping on glasses, however pt not sure of this. Able to navigate environment a little better today with less directional cues but still requires Min A for balance/safety due to weakness and visual deficits. Plan for TEE today. Will continue to follow and progress as tolerated.     Follow Up Recommendations  CIR     Equipment Recommendations  None recommended by PT    Recommendations for Other Services       Precautions / Restrictions Precautions Precautions: Fall Precaution Comments: impaired vision Restrictions Weight Bearing Restrictions: No    Mobility  Bed Mobility Overal bed mobility: Needs Assistance Bed Mobility: Rolling;Sidelying to Sit Rolling: Min guard Sidelying to sit: Min guard;HOB elevated       General bed mobility comments: No assist needed, use of rails. Increased time.   Transfers Overall transfer level: Needs assistance Equipment used: Rolling walker (2 wheeled) Transfers: Sit to/from Stand Sit to Stand: Min assist         General transfer comment: Min A-Min guard to stand from varying surfaces. Despite cues for proper hand placement, pt pulling up on RW. Stood from [employer], from toilet x1.  Ambulation/Gait Ambulation/Gait assistance: Min assist Ambulation Distance (Feet): 16 Feet(x2 bouts) Assistive device: Rolling walker (2 wheeled) Gait Pattern/deviations: Step-through pattern;Decreased stride length;Trunk flexed Gait velocity: decreased    General Gait Details: Requires Min A for directional cues due to impaired vision; use of RW for support. Better able to see today with taping on glasses.   Stairs            Wheelchair Mobility    Modified Rankin (Stroke Patients Only) Modified Rankin (Stroke Patients Only) Pre-Morbid Rankin Score: Slight disability Modified Rankin: Moderately severe disability     Balance Overall balance assessment: Needs assistance Sitting-balance support: Feet supported;No upper extremity supported Sitting balance-Leahy Scale: Good     Standing balance support: During functional activity;Single extremity supported Standing balance-Leahy Scale: Poor Standing balance comment: Requires at least 1 UE support in standing for pericare.                             Cognition Arousal/Alertness: Awake/alert Behavior During Therapy: WFL for tasks assessed/performed Overall Cognitive Status: Within Functional Limits for tasks assessed                                 General Comments: for basic mobility tasks.      Exercises      General Comments General comments (skin integrity, edema, etc.): Reports she is not sure if taping on glasses is helping or not however with visual testing, better able to distinguish correct number of fingers in all visual fields.       Pertinent Vitals/Pain Pain Assessment: No/denies pain    Home Living  Prior Function            PT Goals (current goals can now be found in the care plan section) Progress towards PT goals: Progressing toward goals    Frequency    Min 4X/week      PT Plan Current plan remains appropriate    Co-evaluation              AM-PAC PT "6 Clicks" Daily Activity  Outcome Measure  Difficulty turning over in bed (including adjusting bedclothes, sheets and blankets)?: None Difficulty moving from lying on back to sitting on the side of the bed? : None Difficulty  sitting down on and standing up from a chair with arms (e.g., wheelchair, bedside commode, etc,.)?: Unable Help needed moving to and from a bed to chair (including a wheelchair)?: A Little Help needed walking in hospital room?: A Little Help needed climbing 3-5 steps with a railing? : A Lot 6 Click Score: 17    End of Session Equipment Utilized During Treatment: Gait belt Activity Tolerance: Patient tolerated treatment well Patient left: in bed;with call bell/phone within reach;with bed alarm set Nurse Communication: Mobility status PT Visit Diagnosis: Muscle weakness (generalized) (M62.81);Difficulty in walking, not elsewhere classified (R26.2)     Time: 1034-1100 PT Time Calculation (min) (ACUTE ONLY): 26 min  Charges:  $Gait Training: 8-22 mins $Therapeutic Activity: 8-22 mins                    G Codes:       Wray Kearns, PT, DPT [phone]     Marguarite Arbour A Tomasina Keasling 06/02/2017, 12:03 PM

## 2017-06-02 NOTE — Consult Note (Addendum)
ELECTROPHYSIOLOGY CONSULT NOTE  Patient ID: Sabrina [name] MRN: [id number], DOB/AGE: Mar 27, 1926   Admit date: 05/30/2017 Date of Consult: 06/02/2017  Primary Physician: [name], MD Primary Cardiologist: Dr. [name] (last 2015) Reason for Consultation: Cryptogenic stroke ; recommendations regarding Implantable Loop Recorder, requested by Dr. [name]  History of Present Sabrina [name] was admitted on 05/30/2017 with visual changes/CVA.  PMHx is notable for DM, HTN, breast Ca s/p lumpectomy/chemo, chronic diastolic CHF, mild AS, and hx of DVT/PR previously on warfarin, CKD,   She first developed symptoms of blurred vision after what sounds was a mechanical fall and c/o CP striking her chest on her walker with the fall.  Cardiology was consulted 3/26/19and Dr. [name] felt mild Trop likely 2/2 CKD and no further cardiology w/u recommended and discharged, returning and admitted 05/30/17 with new blurred vision and found with acute CVA  Imaging demonstrated Acute bilateral occipital lobe infarcts - possibly embolic.  she has undergone workup for stroke including echocardiogram and carotid dopplers.  The patient has been monitored on telemetry which has demonstrated sinus rhythm with no arrhythmias.  Inpatient stroke work-up is to be completed with a TEE.   Transthoracic Echocardiogram  05/27/2017 Study Conclusions - Procedure narrative: Transthoracic echocardiography. Image quality was poor. The study was technically difficult, as a result of poor sound wave transmission. - Left ventricle: The cavity size was normal. Wall thickness was increased in a pattern of mild LVH. There was mild concentric hypertrophy. Systolic function was normal. The estimated ejection fraction was in the range of 55% to 60%. Wall motion was normal; there were no regional wall motion abnormalities. Doppler parameters are consistent with abnormal left ventricular relaxation (grade 1 diastolic  dysfunction). - Aortic valve: There was mild stenosis. Valve area (VTI): 1.75 cm^2. Valve area (Vmax): 1.8 cm^2. Valve area (Vmean): 1.7 cm^2. - Mitral valve: Severely calcified annulus. Valve area by continuity equation (using LVOT flow): 1.95 cm^2. - Atrial septum: No defect or patent foramen ovale was identified.   Bilateral Carotid Dopplers  05/31/2017 1-39% ICA stenosis. Vertebral artery flow is antegrade.    Lab work is reviewed.  Prior to admission, the patient denies chest pain, shortness of breath, dizziness, palpitations, or syncope.  Her vision remains poor/blurred, with plans to CIR at discharge, planned for today pending final evaluations   Past Medical History:  Diagnosis Date  . Arthritis    neck  . Breast cancer ([place]) 03/2013   right  . Chronic kidney disease (CKD), stage III (moderate) (HCC)    nephrologist, Dr. [name]  . Dental crowns present   . Heart murmur    no known problems; states did not know she had murmur until age 69  . High cholesterol   . Hypertension    fluctuates, especially when stressed; has been on med. > 20 yr.  . Immature cataract   . Non-insulin dependent type 2 diabetes mellitus ([place])   . Radiation 09/06/13-10/20/13   Right Breast Cancer  . Wears partial dentures    lower     Surgical History:  Past Surgical History:  Procedure Laterality Date  . AXILLARY LYMPH NODE DISSECTION Right 03/30/2013   Procedure: AXILLARY LYMPH NODE DISSECTION;  Surgeon: Rolm Bookbinder, MD;  Location: [facility];  Service: General;  Laterality: Right;  . BREAST LUMPECTOMY WITH NEEDLE LOCALIZATION Right 03/30/2013   Procedure: BREAST LUMPECTOMY WITH NEEDLE LOCALIZATION;  Surgeon: Rolm Bookbinder, MD;  Location: [facility];  Service: General;  Laterality: Right;  .  BREAST SURGERY Right 11/1958   right breast biopsy benign  . DILATION AND CURETTAGE OF UTERUS    . PORTACATH PLACEMENT N/A 04/15/2013   Procedure: INSERTION PORT-A-CATH;  Surgeon:  Rolm Bookbinder, MD;  Location: [name];  Service: General;  Laterality: N/A;  . RE-EXCISION OF BREAST CANCER,SUPERIOR MARGINS Right 04/15/2013   Procedure: RE-EXCISION OF RIGHT BREAST  MARGINS;  Surgeon: Rolm Bookbinder, MD;  Location: [facility];  Service: General;  Laterality: Right;  . TONSILLECTOMY     as a child     Medications Prior to Admission  Medication Sig Dispense Refill Last Dose  . allopurinol (ZYLOPRIM) 100 MG tablet Take 100 mg by mouth daily.    05/29/2017 at Unknown time  . anastrozole (ARIMIDEX) 1 MG tablet Take 1 tablet (1 mg total) by mouth daily. 90 tablet 12 05/29/2017 at Unknown time  . aspirin EC 81 MG tablet Take 1 tablet (81 mg total) by mouth daily.   05/29/2017 at Unknown time  . atorvastatin (LIPITOR) 10 MG tablet Take 10 mg by mouth every evening.    05/29/2017 at Unknown time  . BIOTIN 5000 PO Take 1 tablet by mouth daily.   Past Week at Unknown time  . cholecalciferol (VITAMIN D) 1000 UNITS tablet Take 1,000 Units by mouth daily.   05/29/2017 at Unknown time  . cloNIDine (CATAPRES) 0.2 MG tablet Take 0.2 mg by mouth 2 (two) times daily.   05/29/2017 at Unknown time  . colchicine 0.6 MG tablet Take 0.6 mg by mouth as needed (GOUT).    unknown at prn  . docusate sodium (COLACE) 100 MG capsule Take 100 mg by mouth 2 (two) times daily as needed for mild constipation. Reported on 03/21/2015   05/29/2017 at prn  . furosemide (LASIX) 40 MG tablet TAKE 1 TABLET IN AM AND 1/2 TABLET IN THE PM. (Patient taking differently: TAKE 40 mg  TABLET EVERY OTHER DAY) 60 tablet 0 unknown  . gabapentin (NEURONTIN) 100 MG capsule Take 200 mg by mouth 3 (three) times daily.   0 05/29/2017 at Unknown time  . glipiZIDE (GLUCOTROL) 2.5 mg TABS tablet Take 0.5 tablets (2.5 mg total) by mouth 2 (two) times daily before a meal. (Patient taking differently: Take 2.5 mg by mouth See admin instructions. Take 2.5 in the morning and 1.25) 60 tablet 1 05/29/2017 at  Unknown time  . IRON PO Take 65 mg by mouth daily.   05/29/2017 at Unknown time  . losartan (COZAAR) 25 MG tablet Take 25 mg by mouth daily.    05/29/2017 at Unknown time  . traMADol (ULTRAM) 50 MG tablet Take 1 tablet (50 mg total) by mouth 2 (two) times daily. (Patient taking differently: Take 50 mg by mouth See admin instructions. Take 50 mg in the morning and take 50 mg  as needed in the evening) 60 tablet 1 05/29/2017 at prn  . verapamil (VERELAN PM) 240 MG 24 hr capsule Take 240 mg by mouth daily.    05/29/2017 at Unknown time  . vitamin C (ASCORBIC ACID) 500 MG tablet Take 500 mg by mouth daily.   05/29/2017 at Unknown time    Inpatient Medications:  .  stroke: mapping our early stages of recovery book   Does not apply Once  . acetaminophen  1,000 mg Oral TID  . allopurinol  100 mg Oral Daily  . anastrozole  1 mg Oral Daily  . aspirin EC  325 mg Oral Daily  . atorvastatin  40 mg Oral QPM  . cholecalciferol  1,000 Units Oral Daily  . cloNIDine  0.2 mg Oral BID  . ferrous sulfate  325 mg Oral Daily  . gabapentin  200 mg Oral TID  . heparin  5,000 Units Subcutaneous Q8H  . insulin aspart  0-9 Units Subcutaneous TID WC  . verapamil  240 mg Oral Daily    Allergies: No Known Allergies  Social History   Socioeconomic History  . Marital status: Married    Spouse name: Not on file  . Number of children: 0  . Years of education: Not on file  . Highest education level: Not on file  Occupational History  . Not on file  Social Needs  . Financial resource strain: Not on file  . Food insecurity:    Worry: Not on file    Inability: Not on file  . Transportation needs:    Medical: Not on file    Non-medical: Not on file  Tobacco Use  . Smoking status: Never Smoker  . Smokeless tobacco: Never Used  . Tobacco comment: only smoked 2 packs cigarettes total  Substance and Sexual Activity  . Alcohol use: Yes    Alcohol/week: 0.0 oz    Comment: 2-3 glasses wine/week  . Drug use: No  .  Sexual activity: Not Currently  Lifestyle  . Physical activity:    Days per week: Not on file    Minutes per session: Not on file  . Stress: Not on file  Relationships  . Social connections:    Talks on phone: Not on file    Gets together: Not on file    Attends religious service: Not on file    Active member of club or organization: Not on file    Attends meetings of clubs or organizations: Not on file    Relationship status: Not on file  . Intimate partner violence:    Fear of current or ex partner: Not on file    Emotionally abused: Not on file    Physically abused: Not on file    Forced sexual activity: Not on file  Other Topics Concern  . Not on file  Social History Narrative  . Not on file     Family History  Problem Relation Age of Onset  . Pneumonia Mother   . Heart attack Father   . Breast cancer Other 17       niece  . Breast cancer Other 10       niece      Review of Systems: All other systems reviewed and are otherwise negative except as noted above.  Physical Exam: Vitals:   06/01/17 2158 06/02/17 0009 06/02/17 0400 06/02/17 0743  BP: (!) 172/54 (!) 121/50 (!) 137/47 135/70  Pulse:  69 96 74  Resp:  18 18 18   Temp:  98.4 F (36.9 C) 97.6 F (36.4 C) 97.6 F (36.4 C)  TempSrc:  Oral Oral Oral  SpO2:  99%  96%  Weight:      Height:        GEN- The patient is well appearing, alert and oriented x 3 today.   Head- normocephalic, atraumatic Eyes-  Sclera clear, conjunctiva pink Ears- hearing intact Oropharynx- clear Neck- supple Lungs- CTA b/l, normal work of breathing Heart- RRR, 1/^ SM, no murmurs, rubs or gallops  GI- soft, NT, ND Extremities- no clubbing, cyanosis, or edema MS- no significant deformity or atrophy Skin- no rash or lesion Psych- euthymic mood, full affect  Labs:   Lab Results  Component Value Date   WBC 9.2 05/31/2017   HGB 10.1 (L) 05/31/2017   HCT 31.4 (L) 05/31/2017   MCV 98.4 05/31/2017   PLT 194 05/31/2017      Recent Labs  Lab 05/31/17 0524  06/02/17 0412  NA 136   < > 142  K 4.1   < > 4.8  CL 101   < > 114*  CO2 22   < > 24  BUN 76*   < > 44*  CREATININE 3.52*   < > 2.03*  CALCIUM 8.2*   < > 7.5*  PROT 5.7*  --   --   BILITOT 0.9  --   --   ALKPHOS 67  --   --   ALT 30  --   --   AST 33  --   --   GLUCOSE 178*   < > 127*   < > = values in this interval not displayed.   Lab Results  Component Value Date   TROPONINI 0.15 (HH) 05/28/2017   Lab Results  Component Value Date   CHOL 101 05/31/2017   Lab Results  Component Value Date   HDL 40 (L) 05/31/2017   Lab Results  Component Value Date   LDLCALC 39 05/31/2017   Lab Results  Component Value Date   TRIG 109 05/31/2017   Lab Results  Component Value Date   CHOLHDL 2.5 05/31/2017   No results found for: LDLDIRECT  Lab Results  Component Value Date   DDIMER 0.39 10/12/2014     Radiology/Studies:  Dg Ribs Unilateral W/chest Right Result Date: 05/30/2017 CLINICAL DATA:  Fall landing on walker.  Right rib pain. EXAM: RIGHT RIBS AND CHEST - 3+ VIEW COMPARISON:  Chest x-ray from 3 days ago FINDINGS: Artifact from EKG leads. Cholelithiasis. There is no edema, consolidation, effusion, or pneumothorax. Hazy peripheral opacity at the right apex that is chronic based on 2017 chest CT and likely from radiation fibrosis. Normal heart size. Stable mediastinal contours. Mitral annular calcification. Postoperative right axilla and breast. No visible rib fracture. IMPRESSION: 1. No visible rib fracture or intrathoracic injury. 2. Cholelithiasis. Electronically Signed   By: Monte Fantasia M.D.   On: 05/30/2017 12:41   Ct Head Wo Contrast Result Date: 05/30/2017 CLINICAL DATA:  Ground level fall last night. EXAM: CT HEAD WITHOUT CONTRAST CT CERVICAL SPINE WITHOUT CONTRAST TECHNIQUE: Multidetector CT imaging of the head and cervical spine was performed following the standard protocol without intravenous contrast. Multiplanar CT image  reconstructions of the cervical spine were also generated. COMPARISON:  CT head dated May 27, 2017. FINDINGS: CT HEAD FINDINGS Brain: No evidence of acute infarction, hemorrhage, hydrocephalus, extra-axial collection or mass lesion/mass effect. Apparent hypodensity within the right greater than left occipital lobes. Stable mild atrophy and chronic microvascular ischemic changes. Vascular: Calcified atherosclerosis at the skullbase. No hyperdense vessel. Skull: Normal. Negative for fracture or focal lesion. Sinuses/Orbits: No acute finding. Other: None. CT CERVICAL SPINE FINDINGS Alignment: Trace stepwise anterolisthesis at C3-C4, C4-C5, and C5-C6 due to facet arthropathy. No traumatic malalignment. Skull base and vertebrae: No acute fracture. No primary bone lesion or focal pathologic process. Soft tissues and spinal canal: No prevertebral fluid or swelling. No visible canal hematoma. Disc levels: Mild to moderate disc height loss and uncovertebral hypertrophy from C5-C6 through C7-T1. Moderate to severe bilateral facet arthropathy throughout the cervical spine. Upper chest: Scarring/fibrosis in the right lung apex, possibly from prior radiation. Other: None.  IMPRESSION: 1. No definite acute intracranial abnormality. Apparent hypodensity within the right greater than left occipital lobes is favored artifactual, less likely due to infarct or posterior reversible encephalopathy syndrome. Correlate for visual symptoms or hypertensive urgency. 2. No acute cervical spine fracture. Mild-to-moderate degenerative disc disease in the lower cervical spine. Moderate to severe facet arthropathy throughout the cervical spine. Electronically Signed   By: Titus Dubin M.D.   On: 05/30/2017 12:35   Ct Head Wo Contrast Result Date: 05/27/2017 CLINICAL DATA:  Fall with head impact EXAM: CT HEAD WITHOUT CONTRAST TECHNIQUE: Contiguous axial images were obtained from the base of the skull through the vertex without intravenous  contrast. COMPARISON:  None. FINDINGS: Brain: No mass lesion, intraparenchymal hemorrhage or extra-axial collection. No evidence of acute cortical infarct. There is periventricular hypoattenuation compatible with chronic microvascular disease. Vascular: No hyperdense vessel or unexpected vascular calcification. Skull: Left vertex scalp hematoma.  No skull fracture. Sinuses/Orbits: No sinus fluid levels or advanced mucosal thickening. No mastoid effusion. Normal orbits. IMPRESSION: Small left vertex scalp hematoma without skull fracture or acute intracranial abnormality. Electronically Signed   By: Ulyses Jarred M.D.   On: 05/27/2017 00:44    Mr Brain Wo Contrast Result Date: 05/30/2017 CLINICAL DATA:  Blurry vision for a few days. Multiple recent falls. EXAM: MRI HEAD WITHOUT CONTRAST TECHNIQUE: Multiplanar, multiecho pulse sequences of the brain and surrounding structures were obtained without intravenous contrast. COMPARISON:  Head CT 05/30/2017 and MRI 05/27/2017 FINDINGS: Brain: There are patchy acute infarcts in both occipital lobes which are new from the recent prior MRI and correlate with the hypodensities on today's earlier CT. No intracranial hemorrhage, mass, midline shift, or extra-axial fluid collection is identified. Elsewhere, T2 hyperintensities in the cerebral white matter bilaterally are unchanged from the prior MRI and are nonspecific but compatible with mild chronic small vessel ischemic disease. Generalized cerebral atrophy is not greater than expected for age. Vascular: Major intracranial vascular flow voids are preserved. Skull and upper cervical spine: Unremarkable bone marrow signal. Sinuses/Orbits: Unremarkable orbits. Paranasal sinuses and mastoid air cells are clear. Other: None. IMPRESSION: 1. Acute bilateral occipital lobe infarcts. 2. Mild chronic small vessel ischemic disease. Electronically Signed   By: Logan Bores M.D.   On: 05/30/2017 16:02   Dg Chest Port 1 View Result  Date: 05/27/2017 CLINICAL DATA:  84 year old female with fall.  Elevated troponin. EXAM: PORTABLE CHEST 1 VIEW COMPARISON:  Chest radiograph dated 06/01/2014 FINDINGS: The lungs are clear. There is no pleural effusion or pneumothorax. The cardiac silhouette is within normal limits. No acute osseous pathology. Right axillary surgical clips. IMPRESSION: No active disease. Electronically Signed   By: Anner Crete M.D.   On: 05/27/2017 06:46    12-lead ECG SR All prior EKG's in EPIC reviewed with no documented atrial fibrillation  Telemetry SR, occ PACs/PVCs, no arrhythmias  Assessment and Plan:  1. Cryptogenic stroke The patient presents with cryptogenic stroke.  The patient has a TEE planned for this AM.  I spoke at length with the patient about monitoring for afib with either a 30 day event monitor or an implantable loop recorder.  Risks, benefits, and alteratives to implantable loop recorder were discussed with the patient today.   At this time, the patient is very clear in her decision to proceed with implantable loop recorder.   Wound care was reviewed with the patient (keep incision clean and dry for 3 days).  Wound check will be scheduled for the patient  Please call with  questions.   Renee Dyane Dustman, PA-C 06/02/2017  EP attending  Patient seen and examined.  Agree with the findings as noted above.  The patient is an 84 year old woman who was admitted to the hospital with a cryptogenic stroke.  The patient was unable to undergo transesophageal echo as the probe would not pass.  She has fallen in the past.  She has difficulty swallowing.  Previously she had taken warfarin for DVT.  With all of the above, I do not think she is a particularly good candidate for systemic anticoagulation.  I would recommend Plavix as per the neurology service.  I will see the patient back in the office in approximately 6 weeks.  If she is improving and appears stable on her feet we would reconsider insertion  of an implantable loop recorder.  Crissie Sickles, MD

## 2017-06-02 NOTE — CV Procedure (Signed)
     Transesophageal Echocardiogram Note  Sabrina [name] [id number] April 15, 1926  Procedure: Transesophageal Echocardiogram Indications: stroke  Procedure Details Consent: Obtained Time Out: Verified patient identification, verified procedure, site/side was marked, verified correct patient position, special equipment/implants available, Radiology Safety Procedures followed,  medications/allergies/relevent history reviewed, required imaging and test results available.  Performed  Medications: During this procedure the patient is administered a total of Versed 4 mg and Fentanyl 50 mcg to achieve and maintain moderate conscious sedation.  The patient's heart rate, blood pressure, and oxygen saturation are monitored continuously during the procedure. The period of conscious sedation is 30 minutes, of which I was present face-to-face 100% of this time.    Complications: no complications, however unable to pass the probe pass the oropharynx secondary to resistance. Per patient's sister she has had difficulty swallowing foods lately. A GI evaluation with potential EGD is recommended.  Patient did tolerate procedure well.  Sabrina Dawley, MD, [facility] 06/02/2017, 3:31 PM

## 2017-06-03 ENCOUNTER — Inpatient Hospital Stay (HOSPITAL_COMMUNITY): Payer: Medicare Other

## 2017-06-03 ENCOUNTER — Other Ambulatory Visit: Payer: Self-pay | Admitting: Oncology

## 2017-06-03 DIAGNOSIS — I639 Cerebral infarction, unspecified: Secondary | ICD-10-CM

## 2017-06-03 LAB — BASIC METABOLIC PANEL
Anion gap: 10 (ref 5–15)
BUN: 49 mg/dL — ABNORMAL HIGH (ref 6–20)
CO2: 20 mmol/L — ABNORMAL LOW (ref 22–32)
Calcium: 7 mg/dL — ABNORMAL LOW (ref 8.9–10.3)
Chloride: 110 mmol/L (ref 101–111)
Creatinine, Ser: 2.07 mg/dL — ABNORMAL HIGH (ref 0.44–1.00)
GFR calc Af Amer: 23 mL/min — ABNORMAL LOW (ref >60)
GFR calc non Af Amer: 20 mL/min — ABNORMAL LOW (ref >60)
Glucose, Bld: 123 mg/dL — ABNORMAL HIGH (ref 65–99)
Potassium: 4.9 mmol/L (ref 3.5–5.1)
Sodium: 140 mmol/L (ref 135–145)

## 2017-06-03 LAB — GLUCOSE, CAPILLARY
Glucose-Capillary: 160 mg/dL — ABNORMAL HIGH (ref 65–99)
Glucose-Capillary: 170 mg/dL — ABNORMAL HIGH (ref 65–99)
Glucose-Capillary: 145 mg/dL — ABNORMAL HIGH (ref 65–99)
Glucose-Capillary: 212 mg/dL — ABNORMAL HIGH (ref 65–99)

## 2017-06-03 MED ORDER — FUROSEMIDE 40 MG PO TABS
40.0000 mg | ORAL_TABLET | ORAL | Status: DC
  Administered 2017-06-04: 40 mg via ORAL
  Filled 2017-06-03: qty 1

## 2017-06-03 NOTE — Care Management Note (Signed)
Case Management Note  Patient Details  Name: Sabrina [name] MRN: [id number] Date of Birth: 03/23/1926  Subjective/Objective:     Pt admitted with CVA. She is from home with her spouse. She was active with [facility] for: RN/ PT/ OT/ SW.               Action/Plan: PT/OT recommending CIR. CIR recommending SNF d/t lack of support at d/c. CM following for d/c disposition.   Expected Discharge Date:  06/01/17               Expected Discharge Plan:  Skilled Nursing Facility  In-House Referral:  Clinical Social Work  Discharge planning Services     Post Acute Care Choice:    Choice offered to:     DME Arranged:    DME Agency:     HH Arranged:    HH Agency:     Status of Service:  In process, will continue to follow  If discussed at Long Length of Stay Meetings, dates discussed:    Additional Comments:  Pollie Friar, RN 06/03/2017, 11:11 AM

## 2017-06-03 NOTE — Progress Notes (Signed)
  Speech Language Pathology Treatment: Dysphagia  Patient Details Name: Sabrina [name] MRN: [id number] DOB: Jun 15, 1926 Today's Date: 06/03/2017 Time: 1009-1020 SLP Time Calculation (min) (ACUTE ONLY): 11 min  Assessment / Plan / Recommendation Clinical Impression  Pt demonstrated functional oral and pharyngeal phases of swallow during pill consumption with thin via RN (1-2 at at time). No indications of airway intrusion, pt noted globus sensation following pills and instructed to tuck chin to provide increased pressure for clearance (denied hx of GERD). Mastication normal with solid texture. Per RN documentation lung sounds stable. No difficulty reported by pt. Continue regular texture, thin liquids, pills with thin. No further ST needed.    HPI HPI: Pt is an 84 y.o. female Recent hospital admission on 3/25-3/27/2019 for frequent falls, HTN, HLD, Hx of DVT/PE - previously on Coumadin, Hx Breast Cancer 4 years ago, CHF and CKD who presents to the ED today with complaints of CP after ground level fall last night and now blurry vision.  MRI is positive for acute bilateral occipital infarcts.      SLP Plan  All goals met;Discharge SLP treatment due to (comment)       Recommendations  Diet recommendations: Regular;Thin liquid Liquids provided via: Straw;Cup Medication Administration: Whole meds with liquid Supervision: Patient able to self feed Compensations: Slow rate;Small sips/bites Postural Changes and/or Swallow Maneuvers: Seated upright 90 degrees                Oral Care Recommendations: Oral care BID Follow up Recommendations: None SLP Visit Diagnosis: Dysphagia, unspecified (R13.10) Plan: All goals met;Discharge SLP treatment due to (comment)                       Houston Siren 06/03/2017, 10:32 AM   Orbie Pyo Colvin Caroli.Ed [employer] [phone]

## 2017-06-03 NOTE — Progress Notes (Signed)
PROGRESS NOTE    [name]  SAY:[id number] DOB: 1926-07-22 DOA: 05/30/2017 PCP: [name], MD      Brief Narrative:  Sabrina [name] is a 84 y.o. F with CKD stage IV, HTN, BrCA, and DM who presents with falls and new onset blindness.  The patient blurry vision several days ago, which she describes as "double vision".  She had her glasses checked the day before admission and made another appointment to see her eye doctor on Monday April 1st but the night before admission she tripped on her walker and came to the ED because of chest pain from hitting the walker.      Assessment & Plan:  Stroke, bilateral occipital, likely embolic A3F 5.7%, LDL 39.  MRI shows "patchy acute infarcts in both occipital lobes" new from MRI 4 days ago.  Hemoglobin A1c near goal, LDL 39.  MRI shows bilateral occipital lobe infarcts.  Echo without cardiogenic embolic source, carotids less than 39% bilaterally.  TEE attempted but scope couldn't pass. -Continue aspirin for now -Continue statin -Follow-up with EP as an outpatient -Continue clonidine and verapamil -Restart furosemide  -Hold losartan at discharge -Follow up with [facility] in 6 weeks    Acute kidney injury on chronic kidney disease stage IV FenA 0.7%, suggesting pre-renal injury.   Baseline Cr around 1.8-2, increased to 3.6 on admission, imrpvoed with fluids to 2.0, stable again today.  UA bland.  Losartan held.   -Avoid nephrotoxins and hypotension -Hold Losartan at discharge -Restart furosemide every other day, home dose and check BMP in 5 days  Chest pain This is from falling on her walker.  CXR and dedicated rib films negative. -Tramadol 50 mg PRN for pain  Hypertension Good control again today -Continue verapamil, clonidine, restart furosemide home dose -Hold losartan for now -Stop PRN labetalol  History breast cancer  Discussed thrombotic risk of anastrozole with pharmacy, we will defer decision to continue/stop to her Oncologist,  notified via inbasket. -Continue Arimidex  History gout Quiescent -Continue allopurinol  Diabetes HgbA1c 7.4%.  Blood sugars well controlled. -Continue SSI as needed    DVT prophylaxis: Lovenox Code Status: FULL Family Communication: None present MDM and disposition Plan:  Laboratory studies below and imaging reports were reviewed and summarized above.  Medications were adjusted as above.  She presented with bilateral embolic stroke, her workup is now complete, and she will be discharged when insurance authorization has been approved.  Likely tomorrow.         Consultants:   Neurology  Procedures:   CT head and cspine 3/29 IMPRESSION: 1. No definite acute intracranial abnormality. Apparent hypodensity within the right greater than left occipital lobes is favored artifactual, less likely due to infarct or posterior reversible encephalopathy syndrome. Correlate for visual symptoms or hypertensive urgency. 2. No acute cervical spine fracture. Mild-to-moderate degenerative disc disease in the lower cervical spine. Moderate to severe facet arthropathy throughout the cervical spine.   Rib xrays 3/29 IMPRESSION: 1. No visible rib fracture or intrathoracic injury. 2. Cholelithiasis.    MRI brain 3/29 IMPRESSION: 1. Acute bilateral occipital lobe infarcts. 2. Mild chronic small vessel ischemic disease     Antimicrobials:   None    Subjective: No change to blindness, no new cough, sputum production, fever, confusion, focal weakness, numbness, slurred speech.  Objective: Vitals:   06/02/17 2358 06/03/17 0443 06/03/17 0857 06/03/17 1621  BP: (!) 106/43 (!) 111/38 (!) 152/56 (!) 130/53  Pulse: (!) 57 (!) 53 79 63  Resp: 18  _0 Temp: 97.7 F (36.5 C) 97.7 F (36.5 C) 97.8 F (36.6 C) 98.3 F (36.8 C)  TempSrc: Axillary Oral Oral Oral  SpO2: 97% 96% 96% 98%  Weight:      Height:       No intake or output data in the 24 hours ending 06/03/17  1811 Filed Weights   05/30/17 2355  Weight: 77.6 kg (171 lb 1.2 oz)    Examination: General appearance: Female, lying in bed, glasses on, interactive, no acute distress. HEENT: Lids and lashes are normal, glasses are in place, hearing is normal, oropharynx is moist without lesions.     Skin: Skin is warm and dry without rashes or lesions, no jaundice. Cardiac: Regular rate and rhythm, no murmurs, no lower extremity edema. Respiratory: Effort is normal, lungs are clear without rales or wheezes.  Abdomen: Abdomen is soft, there is no tenderness to palpation, there is no hepatosplenomegaly. MSK: No deformities or effusions. Neuro: Manual nerves are normal.  Sensation is and is intact light touch in the upper and lower extremities.  Strength is normal to observation in both upper and lower extremities bilaterally.    Psych: He is oriented to person, place, time and situation, her affect and judgment are normal, attention is normal.  She has no hallucinations.  Data Reviewed: I have personally reviewed following labs and imaging studies:  CBC: Recent Labs  Lab 05/28/17 0001 05/30/17 1618 05/31/17 0524  WBC 15.3*  --  9.2  NEUTROABS  --  7.2  --   HGB 12.7  --  10.1*  HCT 39.6  --  31.4*  MCV 99.2  --  98.4  PLT 267  --  287   Basic Metabolic Panel: Recent Labs  Lab 05/28/17 0001 05/31/17 0524 06/01/17 1033 06/02/17 0412 06/03/17 0418  NA 144 136 140 142 140  K 4.0 4.1 4.2 4.8 4.9  CL 106 101 109 114* 110  CO2 _1 20*  GLUCOSE 231* 178* 205* 127* 123*  BUN 42* 76* 47* 44* 49*  CREATININE 1.68* 3.52* 2.07* 2.03* 2.07*  CALCIUM 9.5 8.2* 7.8* 7.5* 7.0*   GFR: Estimated Creatinine Clearance: 16.9 mL/min (A) (by C-G formula based on SCr of 2.07 mg/dL (H)). Liver Function Tests: Recent Labs  Lab 05/31/17 0524  AST 33  ALT 30  ALKPHOS 67  BILITOT 0.9  PROT 5.7*  ALBUMIN 2.9*   No results for input(s): LIPASE, AMYLASE in the last 168 hours. No results for  input(s): AMMONIA in the last 168 hours. Coagulation Profile: No results for input(s): INR, PROTIME in the last 168 hours. Cardiac Enzymes: Recent Labs  Lab 05/28/17 0001  TROPONINI 0.15*   BNP (last 3 results) No results for input(s): PROBNP in the last 8760 hours. HbA1C: No results for input(s): HGBA1C in the last 72 hours. CBG: Recent Labs  Lab 06/02/17 1921 06/02/17 2213 06/03/17 0648 06/03/17 1150 06/03/17 1635  GLUCAP 144* 118* 160* 170* 145*   Lipid Profile: No results for input(s): CHOL, HDL, LDLCALC, TRIG, CHOLHDL, LDLDIRECT in the last 72 hours. Thyroid Function Tests: No results for input(s): TSH, T4TOTAL, FREET4, T3FREE, THYROIDAB in the last 72 hours. Anemia Panel: No results for input(s): VITAMINB12, FOLATE, FERRITIN, TIBC, IRON, RETICCTPCT in the last 72 hours. Urine analysis:    Component Value Date/Time   COLORURINE YELLOW 05/31/2017 0905   APPEARANCEUR CLEAR 05/31/2017 0905   LABSPEC 1.012 05/31/2017 0905   PHURINE 6.0 05/31/2017 0905   GLUCOSEU NEGATIVE  05/31/2017 0905   HGBUR SMALL (A) 05/31/2017 0905   BILIRUBINUR NEGATIVE 05/31/2017 0905   KETONESUR NEGATIVE 05/31/2017 0905   PROTEINUR NEGATIVE 05/31/2017 0905   UROBILINOGEN 0.2 09/13/2013 0523   NITRITE NEGATIVE 05/31/2017 0905   LEUKOCYTESUR LARGE (A) 05/31/2017 0905   Sepsis Labs: _0 (procalcitonin:4,lacticacidven:4)  )No results found for this or any previous visit (from the past 240 hour(s)).       Radiology Studies: No results found.      Scheduled Meds: .  stroke: mapping our early stages of recovery book   Does not apply Once  . acetaminophen  1,000 mg Oral TID  . allopurinol  100 mg Oral Daily  . anastrozole  1 mg Oral Daily  . aspirin EC  325 mg Oral Daily  . atorvastatin  40 mg Oral QPM  . cholecalciferol  1,000 Units Oral Daily  . cloNIDine  0.2 mg Oral BID  . ferrous sulfate  325 mg Oral Daily  . gabapentin  200 mg Oral TID  . heparin  5,000 Units  Subcutaneous Q8H  . insulin aspart  0-9 Units Subcutaneous TID WC  . verapamil  240 mg Oral Daily   Continuous Infusions:    LOS: 4 days    Time spent: 25 minutes    Edwin Dada, MD [facility] 06/03/2017, 9:30 AM     Pager [phone] --- please page though AMION:  [email or web address] Password [email or web address] If 7PM-7AM, please contact night-coverage

## 2017-06-03 NOTE — Progress Notes (Signed)
Inpatient Rehabilitation  Met with patient at bedside to discuss team's recommendation for SNF level of post acute rehab given lack of care giver support.  Please refer to consult by Dr. Posey Pronto for full details.  Patient in agreement with plan and CSW made aware.  Will sign off.  Call if questions.    Carmelia Roller., CCC/SLP Admission Coordinator  [place]  Cell [phone]

## 2017-06-03 NOTE — Progress Notes (Signed)
Occupational Therapy Treatment Patient Details Name: Sabrina [name] MRN: [id number] DOB: 10/07/1926 Today's Date: 06/03/2017    History of present illness Patient is a 84 y/o female who was recently admitted 3/25-3/27 due to multiple falls now presents with another fall at home, CP and blurry vision. Brain MRI-bilateral occipital infarcts. PMH includes DM, breast ca, PE, DVT, rt hip pain, CKD.   OT comments  Pt with significant visual deficits impacting her ability to complete her self care and mobility. Pt states she is not seeing "double, but it's messed up". Began educating pt on use of contrast, anchors, decluttering areas and using "anchors" to help on increasing functional level of independence and reducing risk of falls. Pt began crying during session due to her loss of independence. Comfort provided. Pt will benefit form OT at SNF to further assess ability to DC home. Will continue to follow acutely.   Follow Up Recommendations  SNF;Supervision/Assistance - 24 hour    Equipment Recommendations  Other (comment)(TBA at SNF)    Recommendations for Other Services      Precautions / Restrictions Precautions Precautions: Fall Precaution Comments: impaired vision Restrictions Weight Bearing Restrictions: No       Mobility Bed Mobility Overal bed mobility: Needs Assistance Bed Mobility: Supine to Sit;Sit to Supine Rolling: Min guard Sidelying to sit: Min guard;HOB elevated Supine to sit: Supervision Sit to supine: Mod assist Sit to sidelying: Mod assist General bed mobility comments: A to lift B legs onto bed  Transfers Overall transfer level: Needs assistance Equipment used: Rolling walker (2 wheeled) Transfers: Sit to/from Stand Sit to Stand: Min assist Stand pivot transfers: Min assist       General transfer comment: Min A-Min guard to stand from EOB. Despite cues for proper hand placement, pt pulling up on RW. Stood from EOBx1.    Balance Overall balance assessment:  Needs assistance Sitting-balance support: Feet supported;No upper extremity supported Sitting balance-Leahy Scale: Good     Standing balance support: During functional activity;Bilateral upper extremity supported Standing balance-Leahy Scale: Poor Standing balance comment: Requires BUE support in standing.                            ADL either performed or assessed with clinical judgement   ADL Overall ADL's : Needs assistance/impaired     Grooming: Minimal assistance;Standing                   Toilet Transfer: Ambulation;RW;Minimal assistance   Toileting- Clothing Manipulation and Hygiene: Sit to/from stand;Moderate assistance Toileting - Clothing Manipulation Details (indicate cue type and reason): unable to reach to thouroughly clean bottom after BM     Functional mobility during ADLs: Minimal assistance;Rolling walker;Cueing for safety General ADL Comments: Use of contrast and compensatory strategies during self care. Educated NT on need to keep items in consistnet place to increase her independence. Electrode placed over call bell to increase efficiency with calling for help. Pt verbalized understanding.      Vision   Additional Comments: Pt reports that vision is "messed up". States it's not "really double. just distorted". Began educating pt on use of contrast and decluttering areas to increase ability to funciton. Also practiced using "sense of fell" to compensate fo visual impairment adn increase safety with mobility.    Perception     Praxis      Cognition Arousal/Alertness: Awake/alert Behavior During Therapy: WFL for tasks assessed/performed Overall Cognitive Status: Within Functional Limits for  tasks assessed                                 General Comments: tearful during session regarding her functional decline        Exercises     Shoulder Instructions       General Comments Taping removed from glasses upon PT  arrival. Not sure if vision is improving or not per pt report.    Pertinent Vitals/ Pain       Pain Assessment: Faces Faces Pain Scale: Hurts little more Pain Location: R hip Pain Descriptors / Indicators: Discomfort;Sore Pain Intervention(s): Limited activity within patient's tolerance  Home Living                                          Prior Functioning/Environment              Frequency  Min 3X/week        Progress Toward Goals  OT Goals(current goals can now be found in the care plan section)  Progress towards OT goals: Progressing toward goals  Acute Rehab OT Goals Patient Stated Goal: to go home to my husband OT Goal Formulation: With patient Time For Goal Achievement: 06/11/17 Potential to Achieve Goals: Good ADL Goals Pt Will Perform Grooming: with set-up;with supervision;standing Pt Will Perform Upper Body Dressing: with set-up;with supervision;sitting Pt Will Perform Lower Body Dressing: with set-up;with supervision;sit to/from stand Pt Will Transfer to Toilet: with set-up;with supervision;ambulating;bedside commode Pt Will Perform Toileting - Clothing Manipulation and hygiene: with set-up;with supervision;sit to/from stand Additional ADL Goal #1: Pt will independent verbalize understanding of three compensatory techniques for vision Additional ADL Goal #2: Pt with demonstrate management of visual occlusion taping with 1-2 VCs Additional ADL Goal #3: Pt will navigate home distance environments with 1-2 VCs during ADLs  Plan Discharge plan needs to be updated    Co-evaluation                 AM-PAC PT "6 Clicks" Daily Activity     Outcome Measure   Help from another person eating meals?: A Little Help from another person taking care of personal grooming?: A Little Help from another person toileting, which includes using toliet, bedpan, or urinal?: A Lot Help from another person bathing (including washing, rinsing, drying)?: A  Lot Help from another person to put on and taking off regular upper body clothing?: A Little Help from another person to put on and taking off regular lower body clothing?: A Lot 6 Click Score: 15    End of Session Equipment Utilized During Treatment: Gait belt;Rolling walker  OT Visit Diagnosis: Muscle weakness (generalized) (M62.81);Low vision, both eyes (H54.2);Pain Pain - Right/Left: Right Pain - part of body: Hip   Activity Tolerance Patient tolerated treatment well   Patient Left in bed;with call bell/phone within reach;with bed alarm set   Nurse Communication Mobility status        Time: 1520-1550 OT Time Calculation (min): 30 min  Charges: OT General Charges $OT Visit: 1 Visit OT Treatments $Self Care/Home Management : 23-37 mins  [facility], OT/L  [phone] 06/03/2017   Sabrina [name],Sabrina [name] 06/03/2017, 2:23 PM

## 2017-06-03 NOTE — Care Management Important Message (Signed)
Important Message  Patient Details  Name: Sabrina [name] MRN: [id number] Date of Birth: [birth year]   Medicare Important Message Given:  Yes    Khasir Woodrome Mont[address] Circle 06/03/2017, 2:44 PM

## 2017-06-03 NOTE — Progress Notes (Signed)
CSW following for discharge plan. CSW noting that Rehab MD recommending SNF due to no caregiver support at discharge. Patient is a recent readmission, with assessment completed on 05/28/17 (see below). CSW to fax out referral and follow up with bed offers and insurance authorization.  Laveda Abbe, [place] Clinical Social Worker [phone]       Clinical Social Work Assessment  Patient Details  Name: Sabrina [name] MRN: [id number] Date of Birth: 08-29-1926  Date of referral:  05/28/17               Reason for consult:  Facility Placement                 Permission sought to share information with:  Case Manager, Customer service manager Permission granted to share information::  Yes, Verbal Permission Granted             Name::                   Agency::                Relationship::                Contact Information:     Housing/Transportation Living arrangements for the past 2 months:  Single Family Home Source of Information:  Patient, Siblings Patient Interpreter Needed:  None Criminal Activity/Legal Involvement Pertinent to Current Situation/Hospitalization:  No - Comment as needed Significant Relationships:  Spouse, Siblings Lives with:  Spouse Do you feel safe going back to the place where you live?  (PT recommending SNF) Need for family participation in patient care:  No (Coment)  Care giving concerns:  Patient from home with husband. Patient is primary caregiver for her husband. Patient reported that prior to hospitalization that she was independent with ADLs and that she had an aide that came 5 days /week to assist with cleaning, running errands and making sure that patient and her husband have breakfast. Patient reported that she had multiple falls prior to admission. PT recommending SNF.   Social Worker assessment / plan:  CSW spoke with patient at bedside regarding discharge planning and PT recommendation for SNF. CSW explained SNF placement process  and insurance authorization, patient verbalized understanding. Patient reported that she would prefer to go to [facility] SNF if she has to go to a SNF for ST rehab. CSW explained that patient may not obtain insurance authorization prior to discharge and inquired about patient's ability to private pay for SNF until authorization is received. Patient reported that she may be able to private pay for SNF for a couple weeks. CSW agreed to follow up with [facility] SNF.  CSW contacted [facility] SNF to see if they had a bed available for patient and if they would accept private pay for patient while waiting on insurance authorization. CSW was not able to get in touch with admissions staff at [facility].   CSW updated patient, patient reported that she may not be ready to dc from hospital. CSW agreed to have patient's RNCM follow up with patient about discharge options.  CSW updated patient's RNCM. Patient's RNCM agreed to follow up with patient about discharge options.   CSW and RNCM spoke with patient at bedside about discharge options. Patient reported that she will discharge home with home health services and try to obtain additional services from current private duty agency.  CSW signing off, no other needs identified at this time.   Employment status:  Retired Forensic scientist:  Managed Medicare PT Recommendations:  [place] / Referral to community resources:  [place], Other (Comment Required)(referral to case management for home health services)  Patient/Family's Response to care:  Patient agreeable to home with home health services and seeking additional care from current private duty care agency.  Patient/Family's Understanding of and Emotional Response to Diagnosis, Current Treatment, and Prognosis:  Patient presented calm and became tearful when talking about her husband's care needs.Patient verbalized understanding of her  diagnosis, current treatment and PT recommendation for SNF. Patient reported that her husband has early dementia and cannot stay home alone. Patient verbalized plan to discharge home with home health services and obtain additional services from her current private duty care agency.   Emotional Assessment Appearance:  Appears stated age Attitude/Demeanor/Rapport:  (Open) Affect (typically observed):  Tearful/Crying Orientation:  Oriented to Self, Oriented to Situation, Oriented to Place, Oriented to  Time Alcohol / Substance use:  Not Applicable Psych involvement (Current and /or in the community):  No (Comment)  Discharge Needs  Concerns to be addressed:  Care Coordination Readmission within the last 30 days:  No Current discharge risk:  Physical Impairment Barriers to Discharge:  No Barriers Identified   Burnis Medin, LCSW 05/28/2017, 4:01 PM

## 2017-06-03 NOTE — Progress Notes (Signed)
Physical Therapy Treatment Patient Details Name: Sabrina [name] MRN: [id number] DOB: Apr 01, 1926 Today's Date: 06/03/2017    History of Present Illness Patient is a 84 y/o female who was recently admitted 3/25-3/27 due to multiple falls now presents with another fall at home, CP and blurry vision. Brain MRI-bilateral occipital infarcts. PMH includes DM, breast ca, PE, DVT, rt hip pain, CKD.    PT Comments    Patient progressing well towards PT goals. Reports right hip pain today which pt reports she was supposed top get an injection last week but it was canceled. Improved ambulation distance today with Min A for balance/safety. Still requires cues for direction due to visual impairments during mobility. Better able to navigate environment today, must be getting slightly familiar with room. Continue to recommend CIR. Will follow.    Follow Up Recommendations  CIR     Equipment Recommendations  None recommended by PT    Recommendations for Other Services       Precautions / Restrictions Precautions Precautions: Fall Precaution Comments: impaired vision Restrictions Weight Bearing Restrictions: No    Mobility  Bed Mobility Overal bed mobility: Needs Assistance Bed Mobility: Rolling;Sidelying to Sit;Sit to Sidelying Rolling: Min guard Sidelying to sit: Min guard;HOB elevated     Sit to sidelying: Mod assist General bed mobility comments: Assist needed to bring LUE towards rail, pain in right hip with bed mobility. Increased time. Assist to bring BLEs into bed to return to supine.   Transfers Overall transfer level: Needs assistance Equipment used: Rolling walker (2 wheeled) Transfers: Sit to/from Stand Sit to Stand: Min assist         General transfer comment: Min A-Min guard to stand from EOB. Despite cues for proper hand placement, pt pulling up on RW. Stood from EOBx1.  Ambulation/Gait Ambulation/Gait assistance: Min assist Ambulation Distance (Feet): 40  Feet Assistive device: Rolling walker (2 wheeled) Gait Pattern/deviations: Step-through pattern;Decreased stride length;Trunk flexed Gait velocity: decreased   General Gait Details: Requires Min A for directional cues due to impaired vision; use of RW for support. Unsteady on feet, Min A for balance.    Stairs            Wheelchair Mobility    Modified Rankin (Stroke Patients Only) Modified Rankin (Stroke Patients Only) Pre-Morbid Rankin Score: Slight disability Modified Rankin: Moderately severe disability     Balance Overall balance assessment: Needs assistance Sitting-balance support: Feet supported;No upper extremity supported Sitting balance-Leahy Scale: Good     Standing balance support: During functional activity;Bilateral upper extremity supported Standing balance-Leahy Scale: Poor Standing balance comment: Requires BUE support in standing.                             Cognition Arousal/Alertness: Awake/alert Behavior During Therapy: WFL for tasks assessed/performed Overall Cognitive Status: Within Functional Limits for tasks assessed                                        Exercises      General Comments General comments (skin integrity, edema, etc.): Taping removed from glasses upon PT arrival. Not sure if vision is improving or not per pt report.      Pertinent Vitals/Pain Pain Assessment: Faces Faces Pain Scale: Hurts little more Pain Location: R hip Pain Descriptors / Indicators: Discomfort;Sore;Shooting Pain Intervention(s): Monitored during session;Repositioned;Limited activity within patient's tolerance  Home Living                      Prior Function            PT Goals (current goals can now be found in the care plan section) Progress towards PT goals: Progressing toward goals    Frequency    Min 4X/week      PT Plan Current plan remains appropriate    Co-evaluation               AM-PAC PT "6 Clicks" Daily Activity  Outcome Measure  Difficulty turning over in bed (including adjusting bedclothes, sheets and blankets)?: None Difficulty moving from lying on back to sitting on the side of the bed? : Unable Difficulty sitting down on and standing up from a chair with arms (e.g., wheelchair, bedside commode, etc,.)?: Unable Help needed moving to and from a bed to chair (including a wheelchair)?: A Little Help needed walking in hospital room?: A Little Help needed climbing 3-5 steps with a railing? : A Lot 6 Click Score: 14    End of Session Equipment Utilized During Treatment: Gait belt Activity Tolerance: Patient tolerated treatment well Patient left: in bed;with call bell/phone within reach;with bed alarm set Nurse Communication: Mobility status PT Visit Diagnosis: Muscle weakness (generalized) (M62.81);Difficulty in walking, not elsewhere classified (R26.2)     Time: 8270-7867 PT Time Calculation (min) (ACUTE ONLY): 20 min  Charges:  $Gait Training: 8-22 mins                    G Codes:       Wray Kearns, PT, DPT [phone]     [place] 06/03/2017, 11:30 AM

## 2017-06-03 NOTE — NC FL2 (Signed)
[place] MEDICAID FL2 LEVEL OF CARE SCREENING TOOL     IDENTIFICATION  Patient Name: Sabrina [name] Birthdate: [birth year] Sex: female Admission Date (Current Location): 05/30/2017  [facility] and [place] Number:  Herbalist and Address:  [facility] [place] [address], [place], [place] [address]      Provider Number: [id number]  Attending Physician Name and Address:  Edwin Dada, *  Relative Name and Phone Number:       Current Level of Care: Hospital Recommended Level of Care: [place] Prior Approval Number:    Date Approved/Denied:   PASRR Number: [id number]  Discharge Plan: SNF    Current Diagnoses: Patient Active Problem List   Diagnosis Date Noted  . Blurry vision, bilateral   . Acute blood loss anemia   . History of breast cancer 05/30/2017  . Weakness 05/30/2017  . Occipital infarction ([place]) 05/30/2017  . Pressure injury of skin 05/28/2017  . Fall at home 05/27/2017  . Chronic kidney disease (CKD), stage III (moderate) ([place]) 05/27/2017  . High cholesterol 05/27/2017  . DM (diabetes mellitus) ([place]) 05/27/2017  . Hypertension 05/27/2017  . Gout 05/27/2017  . Elevated troponin 05/27/2017  . Lipoma of lower extremity 09/12/2014  . Abnormal x-ray 03/15/2014  . Chronic diastolic congestive heart failure ([place]) 02/23/2014  . Osteopenia 11/30/2013  . Hypoglycemia 09/13/2013  . Acute respiratory failure with hypoxia ([place]) 06/14/2013  . Pulmonary embolism ([place]) 06/10/2013  . Nausea and vomiting 06/10/2013  . Accelerated hypertension 06/10/2013  . Aortic stenosis 04/22/2013  . Edema leg 04/22/2013  . Breast cancer of upper-outer quadrant of right female breast ([place]) 03/19/2013    Orientation RESPIRATION BLADDER Height & Weight     Self, Time, Situation, Place  Normal Continent Weight: 171 lb 1.2 oz (77.6 kg) Height:  4\' 11"  (149.9 cm)  BEHAVIORAL SYMPTOMS/MOOD NEUROLOGICAL BOWEL NUTRITION STATUS   Continent Diet(carb modified; heart healthy)  AMBULATORY STATUS COMMUNICATION OF NEEDS Skin   Limited Assist Verbally PU Stage and Appropriate Care PU Stage 1 Dressing: (unstageable wound, sacrum; moisture barrier)                     Personal Care Assistance Level of Assistance  Feeding, Bathing, Dressing Bathing Assistance: Limited assistance Feeding assistance: Independent Dressing Assistance: Limited assistance     Functional Limitations Info  Sight, Hearing, Speech Sight Info: Impaired(blurred vision) Hearing Info: Adequate Speech Info: Adequate    SPECIAL CARE FACTORS FREQUENCY  PT (By licensed PT), OT (By licensed OT)     PT Frequency: 5x/wk OT Frequency: 5x/wk            Contractures Contractures Info: Not present    Additional Factors Info  Code Status, Allergies, Insulin Sliding Scale Code Status Info: Full Allergies Info: NKA   Insulin Sliding Scale Info: 0-9 units 3x/day with meals       Current Medications (06/03/2017):  This is the current hospital active medication list Current Facility-Administered Medications  Medication Dose Route Frequency Provider Last Rate Last Dose  .  stroke: mapping our early stages of recovery book   Does not apply Once Rondel Jumbo, PA-C      . acetaminophen (TYLENOL) tablet 1,000 mg  1,000 mg Oral TID Edwin Dada, MD   1,000 mg at 06/03/17 1000  . allopurinol (ZYLOPRIM) tablet 100 mg  100 mg Oral Daily Sharene Butters E, PA-C   100 mg at 06/03/17 1000  . anastrozole (ARIMIDEX)  tablet 1 mg  1 mg Oral Daily Rondel Jumbo, PA-C   1 mg at 06/03/17 1000  . aspirin EC tablet 325 mg  325 mg Oral Daily Costello, Mary A, NP   325 mg at 06/03/17 1000  . atorvastatin (LIPITOR) tablet 40 mg  40 mg Oral QPM Rondel Jumbo, PA-C   40 mg at 06/02/17 1644  . bisacodyl (DULCOLAX) suppository 10 mg  10 mg Rectal Daily PRN Edwin Dada, MD   10 mg at 05/31/17 1259  . cholecalciferol (VITAMIN D) tablet 1,000 Units   1,000 Units Oral Daily Rondel Jumbo, PA-C   1,000 Units at 06/03/17 1000  . cloNIDine (CATAPRES) tablet 0.2 mg  0.2 mg Oral BID Edwin Dada, MD   0.2 mg at 06/03/17 1000  . ferrous sulfate tablet 325 mg  325 mg Oral Daily Danford, Suann Larry, MD   325 mg at 06/03/17 1000  . gabapentin (NEURONTIN) capsule 200 mg  200 mg Oral TID Edwin Dada, MD   200 mg at 06/03/17 1000  . heparin injection 5,000 Units  5,000 Units Subcutaneous Q8H Rondel Jumbo, PA-C   5,000 Units at 06/03/17 5573  . insulin aspart (novoLOG) injection 0-9 Units  0-9 Units Subcutaneous TID WC Rondel Jumbo, PA-C   2 Units at 06/02/17 1701  . labetalol (NORMODYNE,TRANDATE) injection 5 mg  5 mg Intravenous Q2H PRN Danford, Suann Larry, MD      . ondansetron (ZOFRAN) injection 4 mg  4 mg Intravenous Q8H PRN Edwin Dada, MD   4 mg at 05/31/17 0919  . traMADol (ULTRAM) tablet 50 mg  50 mg Oral Q6H PRN Edwin Dada, MD   50 mg at 06/01/17 1435  . verapamil (CALAN-SR) CR tablet 240 mg  240 mg Oral Daily Danford, Suann Larry, MD   240 mg at 06/03/17 1000     Discharge Medications: Please see discharge summary for a list of discharge medications.  Relevant Imaging Results:  Relevant Lab Results:   Additional Information SS#: [id number]  Geralynn Ochs, LCSW

## 2017-06-03 NOTE — Progress Notes (Signed)
CSW following for discharge plan. CSW worked throughout the day on coordinating patient discharge plan.   11:00 AM: CSW sent out SNF referral and confirmed bed offer and availability at [facility] for the patient. CSW faxed over insurance authorization request to [facility].   2:30 PM: CSW met with patient to discuss need for SNF placement. CSW confirmed bed available at [facility] for the patient, and that we're waiting on insurance approval. Patient discussed how she is appreciative of the hospital's care, but she is concerned about her husband. Patient asked if the husband could go with her to SNF, and CSW explained that he couldn't. CSW discussed with the patient that (per chart review) patient's husband has been set up with home health care and private duty aids to help with the husband's care.   4:30 PM: CSW got a call from [facility] that patient has been approved for SNF placement. CSW updated [place] with authorization information, but facility will not be able to accept her today as their policy states that they have to have patients admit by 5 PM. CSW alerted MD.  CSW will continue to follow for patient to discharge to SNF tomorrow.  Laveda Abbe, [place] Clinical Social Worker [phone]

## 2017-06-04 DIAGNOSIS — R079 Chest pain, unspecified: Secondary | ICD-10-CM | POA: Diagnosis not present

## 2017-06-04 DIAGNOSIS — N183 Chronic kidney disease, stage 3 (moderate): Secondary | ICD-10-CM | POA: Diagnosis not present

## 2017-06-04 DIAGNOSIS — C50919 Malignant neoplasm of unspecified site of unspecified female breast: Secondary | ICD-10-CM | POA: Diagnosis not present

## 2017-06-04 DIAGNOSIS — D509 Iron deficiency anemia, unspecified: Secondary | ICD-10-CM | POA: Diagnosis not present

## 2017-06-04 DIAGNOSIS — H538 Other visual disturbances: Secondary | ICD-10-CM | POA: Diagnosis not present

## 2017-06-04 DIAGNOSIS — I639 Cerebral infarction, unspecified: Secondary | ICD-10-CM | POA: Diagnosis not present

## 2017-06-04 DIAGNOSIS — N179 Acute kidney failure, unspecified: Secondary | ICD-10-CM | POA: Diagnosis not present

## 2017-06-04 DIAGNOSIS — C50411 Malignant neoplasm of upper-outer quadrant of right female breast: Secondary | ICD-10-CM | POA: Diagnosis not present

## 2017-06-04 DIAGNOSIS — I10 Essential (primary) hypertension: Secondary | ICD-10-CM | POA: Diagnosis not present

## 2017-06-04 DIAGNOSIS — I63539 Cerebral infarction due to unspecified occlusion or stenosis of unspecified posterior cerebral artery: Secondary | ICD-10-CM | POA: Diagnosis not present

## 2017-06-04 DIAGNOSIS — M109 Gout, unspecified: Secondary | ICD-10-CM | POA: Diagnosis not present

## 2017-06-04 DIAGNOSIS — E559 Vitamin D deficiency, unspecified: Secondary | ICD-10-CM | POA: Diagnosis not present

## 2017-06-04 DIAGNOSIS — R278 Other lack of coordination: Secondary | ICD-10-CM | POA: Diagnosis not present

## 2017-06-04 DIAGNOSIS — M5431 Sciatica, right side: Secondary | ICD-10-CM | POA: Diagnosis not present

## 2017-06-04 DIAGNOSIS — R2681 Unsteadiness on feet: Secondary | ICD-10-CM | POA: Diagnosis not present

## 2017-06-04 DIAGNOSIS — S20211D Contusion of right front wall of thorax, subsequent encounter: Secondary | ICD-10-CM | POA: Diagnosis not present

## 2017-06-04 DIAGNOSIS — Z170 Estrogen receptor positive status [ER+]: Secondary | ICD-10-CM | POA: Diagnosis not present

## 2017-06-04 DIAGNOSIS — E1340 Other specified diabetes mellitus with diabetic neuropathy, unspecified: Secondary | ICD-10-CM | POA: Diagnosis not present

## 2017-06-04 DIAGNOSIS — S20211A Contusion of right front wall of thorax, initial encounter: Secondary | ICD-10-CM | POA: Diagnosis not present

## 2017-06-04 DIAGNOSIS — E118 Type 2 diabetes mellitus with unspecified complications: Secondary | ICD-10-CM | POA: Diagnosis not present

## 2017-06-04 DIAGNOSIS — M6281 Muscle weakness (generalized): Secondary | ICD-10-CM | POA: Diagnosis not present

## 2017-06-04 DIAGNOSIS — R52 Pain, unspecified: Secondary | ICD-10-CM | POA: Diagnosis not present

## 2017-06-04 DIAGNOSIS — I5032 Chronic diastolic (congestive) heart failure: Secondary | ICD-10-CM | POA: Diagnosis not present

## 2017-06-04 DIAGNOSIS — Z9181 History of falling: Secondary | ICD-10-CM | POA: Diagnosis not present

## 2017-06-04 DIAGNOSIS — E119 Type 2 diabetes mellitus without complications: Secondary | ICD-10-CM | POA: Diagnosis not present

## 2017-06-04 DIAGNOSIS — K5900 Constipation, unspecified: Secondary | ICD-10-CM | POA: Diagnosis not present

## 2017-06-04 LAB — BASIC METABOLIC PANEL
Anion gap: 7 (ref 5–15)
BUN: 41 mg/dL — ABNORMAL HIGH (ref 6–20)
CO2: 21 mmol/L — ABNORMAL LOW (ref 22–32)
Calcium: 7.2 mg/dL — ABNORMAL LOW (ref 8.9–10.3)
Chloride: 112 mmol/L — ABNORMAL HIGH (ref 101–111)
Creatinine, Ser: 1.76 mg/dL — ABNORMAL HIGH (ref 0.44–1.00)
GFR calc Af Amer: 29 mL/min — ABNORMAL LOW (ref >60)
GFR calc non Af Amer: 25 mL/min — ABNORMAL LOW (ref >60)
Glucose, Bld: 183 mg/dL — ABNORMAL HIGH (ref 65–99)
Potassium: 5 mmol/L (ref 3.5–5.1)
Sodium: 140 mmol/L (ref 135–145)

## 2017-06-04 LAB — GLUCOSE, CAPILLARY
Glucose-Capillary: 139 mg/dL — ABNORMAL HIGH (ref 65–99)
Glucose-Capillary: 191 mg/dL — ABNORMAL HIGH (ref 65–99)

## 2017-06-04 MED ORDER — FUROSEMIDE 40 MG PO TABS: ORAL_TABLET | ORAL | 0 refills | Status: DC

## 2017-06-04 MED ORDER — ATORVASTATIN CALCIUM 40 MG PO TABS: 40.0000 mg | ORAL_TABLET | Freq: Every evening | ORAL | Status: DC

## 2017-06-04 MED ORDER — TRAMADOL HCL 50 MG PO TABS: 50.0000 mg | ORAL_TABLET | Freq: Two times a day (BID) | ORAL | 0 refills | Status: AC | PRN

## 2017-06-04 MED ORDER — ASPIRIN 325 MG PO TBEC: 325.0000 mg | DELAYED_RELEASE_TABLET | Freq: Every day | ORAL | Status: DC

## 2017-06-04 NOTE — Progress Notes (Addendum)
Pt educated on discharge instructions, reviewed teaching, able to teach back. No new concerns or questions. IV and telemetry removed. Belongings gathered.  Gave report to Academic librarian at [employer] @ [phone]

## 2017-06-04 NOTE — Progress Notes (Signed)
RN attempted to call report to [facility]. Left message with number. Will attempt again.

## 2017-06-04 NOTE — Care Management Note (Signed)
Case Management Note  Patient Details  Name: Sabrina [name] MRN: [id number] Date of Birth: 1926/06/13  Subjective/Objective:                    Action/Plan: Pt discharging to [facility] SNF today. CM signing off.  Expected Discharge Date:  06/04/17               Expected Discharge Plan:  Skilled Nursing Facility  In-House Referral:  Clinical Social Work  Discharge planning Services     Post Acute Care Choice:    Choice offered to:     DME Arranged:    DME Agency:     HH Arranged:    [place] Agency:     Status of Service:  Completed, signed off  If discussed at [employer], dates discussed:    Additional Comments:  Pollie Friar, RN 06/04/2017, 12:54 PM

## 2017-06-04 NOTE — Clinical Social Work Placement (Addendum)
Nurse to call report to [phone]  Transport set for 1:30 PM    CLINICAL SOCIAL WORK PLACEMENT  NOTE  Date:  06/04/2017  Patient Details  Name: Sabrina [name] MRN: [id number] Date of Birth: Jan 31, 1927  Clinical Social Work is seeking post-discharge placement for this patient at the [place] level of care (*CSW will initial, date and re-position this form in  chart as items are completed):  Yes   Patient/family provided with Post Work Department's list of facilities offering this level of care within the geographic area requested by the patient (or if unable, by the patient's family).  Yes   Patient/family informed of their freedom to choose among providers that offer the needed level of care, that participate in Medicare, Medicaid or managed care program needed by the patient, have an available bed and are willing to accept the patient.  Yes   Patient/family informed of [place]'s ownership interest in [facility] and [facility], as well as of the fact that they are under no obligation to receive care at these facilities.  PASRR submitted to EDS on       PASRR number received on       Existing PASRR number confirmed on 06/03/17     FL2 transmitted to all facilities in geographic area requested by pt/family on 06/03/17     FL2 transmitted to all facilities within larger geographic area on       Patient informed that his/her managed care company has contracts with or will negotiate with certain facilities, including the following:        Yes   Patient/family informed of bed offers received.  Patient chooses bed at [facility]     Physician recommends and patient chooses bed at      Patient to be transferred to [facility] on 06/04/17.  Patient to be transferred to facility by [facility]     Patient family notified on 06/04/17 of transfer.  Name of family member notified:        PHYSICIAN       Additional Comment:     _______________________________________________ Geralynn Ochs, LCSW 06/04/2017, 12:00 PM

## 2017-06-04 NOTE — Discharge Summary (Signed)
Physician Discharge Summary  Sabrina [name] VQQ:[id number] DOB: [birth year] DOA: 05/30/2017  PCP: [name], MD  Admit date: 05/30/2017 Discharge date: 06/04/2017  Admitted From: Home Disposition: SNF   Recommendations for Outpatient Follow-up:  1. Follow up with PCP in 1-2 weeks 2. Follow up with neurology ([facility]) in 6 weeks 3. Follow up with EP as outpatient to discuss loop recorder, holding anticoagulation for now.  4. Please obtain BMP in 1 week  Home Health: N/A Equipment/Devices: Per SNF Discharge Condition: Stable CODE STATUS: Full Diet recommendation: Heart healthy, carb-modified  Brief/Interim Summary: Sabrina [name] is an 84 y.o. female with a history of HTN, HLD, Hx DVT/PE no longer on anticoagulation, breast CA on arimidex, chronic CHD and CKD stage III who presented to the ED for chest pain following a recurrent fall at home as well as bilateral visual disturbances (decreased acuity and double vision) found to have acute bilateral occipital infarcts on MRI. Work up did not show cardioembolic source on TTE though TEE attempted, this was unsuccessful. Plan is to follow up with EP as outpatient to discuss implantable loop recorder.   Discharge Diagnoses:  Principal Problem:   Occipital infarction [facility]) Active Problems:   Breast cancer of upper-outer quadrant of right female breast ([place])   Pulmonary embolism (HCC)   Accelerated hypertension   Chronic diastolic congestive heart failure (HCC)   Chronic kidney disease (CKD), stage III (moderate) (HCC)   High cholesterol   DM (diabetes mellitus) ([place])   Hypertension   Gout   Blurry vision, bilateral   Acute blood loss anemia  Bilateral occipital CVAs: With resultant visual deficits. A1c 7.4%, LDL 39.  MRI shows "patchy acute infarcts in both occipital lobes" new from MRI 4 days PTA. Echo without cardiogenic embolic source, carotids less than 39% bilaterally.  TEE attempted but scope couldn't pass. -Continue aspirin for  now -Continue statin (increase atorvastatin 10mg  > 40mg ) -Stroke neurology recommended follow-up with EP as an outpatient for loop recorder. -Follow up with [facility] in 6 weeks  Acute kidney injury on chronic kidney disease stage IV FENa 0.7%, suggesting pre-renal injury.  Baseline Cr around 1.8-2, increased to 3.6 on admission, returned to baseline with fluids and holding ARB. UA bland. -Avoid nephrotoxins and hypotension -Hold losartan at discharge -Restart furosemide every other day, home dose and check BMP in 1 week.  Chest pain: This is from falling on her walker.  CXR and dedicated rib films negative. No ACS suspected. -Tramadol 50 mg PRN for pain #6, no refills  Hypertension:  -Continue verapamil, clonidine, QOD furosemide -Hold losartan for now  History breast cancer: Discussed thrombotic risk of anastrozole with pharmacy, we will defer decision to continue/stop to her Oncologist who was notified via inbasket by Dr. Loleta Books. -Continue Arimidex  History gout: No exacerbation. -Continue allopurinol  NIDT2DM: HgbA1c 7.4%.  Blood sugars well controlled. -Continue glipizide  Discharge Instructions Discharge Instructions    Diet - low sodium heart healthy   Complete by:  As directed    Diet Carb Modified   Complete by:  As directed      Allergies as of 06/04/2017   No Known Allergies     Medication List    STOP taking these medications   losartan 25 MG tablet Commonly known as:  COZAAR     TAKE these medications   allopurinol 100 MG tablet Commonly known as:  ZYLOPRIM Take 100 mg by mouth daily.   anastrozole 1 MG tablet Commonly known as:  ARIMIDEX Take  1 tablet (1 mg total) by mouth daily.   aspirin 325 MG EC tablet Take 1 tablet (325 mg total) by mouth daily. What changed:    medication strength  how much to take   atorvastatin 40 MG tablet Commonly known as:  LIPITOR Take 1 tablet (40 mg total) by mouth every evening. What changed:     medication strength  how much to take   BIOTIN 5000 PO Take 1 tablet by mouth daily.   cholecalciferol 1000 units tablet Commonly known as:  VITAMIN D Take 1,000 Units by mouth daily.   cloNIDine 0.2 MG tablet Commonly known as:  CATAPRES Take 0.2 mg by mouth 2 (two) times daily.   colchicine 0.6 MG tablet Take 0.6 mg by mouth as needed (GOUT).   docusate sodium 100 MG capsule Commonly known as:  COLACE Take 100 mg by mouth 2 (two) times daily as needed for mild constipation. Reported on 03/21/2015   furosemide 40 MG tablet Commonly known as:  LASIX TAKE 40 mg  TABLET EVERY OTHER DAY   gabapentin 100 MG capsule Commonly known as:  NEURONTIN Take 200 mg by mouth 3 (three) times daily.   glipiZIDE 2.5 mg Tabs tablet Commonly known as:  GLUCOTROL Take 0.5 tablets (2.5 mg total) by mouth 2 (two) times daily before a meal. What changed:    when to take this  additional instructions   IRON PO Take 65 mg by mouth daily.   traMADol 50 MG tablet Commonly known as:  ULTRAM Take 1 tablet (50 mg total) by mouth every 12 (twelve) hours as needed for up to 3 days (pain). What changed:    when to take this  reasons to take this   verapamil 240 MG 24 hr capsule Commonly known as:  VERELAN PM Take 240 mg by mouth daily.   vitamin C 500 MG tablet Commonly known as:  ASCORBIC ACID Take 500 mg by mouth daily.       Contact information for follow-up providers    Evans Lance, MD Follow up.   Specialty:  Cardiology Why:  you will be called by Dr. Tanna Furry scheduler to arrange follow up visit in the next 6 weeks. Contact information: [address] [place] [address] [phone]        Garvin Fila, MD. Call in 6 week(s).   Specialties:  Neurology, Radiology Contact information: [address] [place] [address] [phone]            Contact information for after-discharge care    Destination     [facility] .   Service:  Skilled Nursing Contact information: [address] [place] [phone]                 No Known Allergies  Consultations:  Stroke neurology, Dr. Leonie Man  Procedures/Studies: Dg Ribs Unilateral W/chest Right  Result Date: 05/30/2017 CLINICAL DATA:  Fall landing on walker.  Right rib pain. EXAM: RIGHT RIBS AND CHEST - 3+ VIEW COMPARISON:  Chest x-ray from 3 days ago FINDINGS: Artifact from EKG leads. Cholelithiasis. There is no edema, consolidation, effusion, or pneumothorax. Hazy peripheral opacity at the right apex that is chronic based on 2017 chest CT and likely from radiation fibrosis. Normal heart size. Stable mediastinal contours. Mitral annular calcification. Postoperative right axilla and breast. No visible rib fracture. IMPRESSION: 1. No visible rib fracture or intrathoracic injury. 2. Cholelithiasis. Electronically Signed   By: Angelica Chessman  Watts M.D.   On: 05/30/2017 12:41   Ct Head Wo Contrast  Result Date: 05/30/2017 CLINICAL DATA:  Ground level fall last night. EXAM: CT HEAD WITHOUT CONTRAST CT CERVICAL SPINE WITHOUT CONTRAST TECHNIQUE: Multidetector CT imaging of the head and cervical spine was performed following the standard protocol without intravenous contrast. Multiplanar CT image reconstructions of the cervical spine were also generated. COMPARISON:  CT head dated May 27, 2017. FINDINGS: CT HEAD FINDINGS Brain: No evidence of acute infarction, hemorrhage, hydrocephalus, extra-axial collection or mass lesion/mass effect. Apparent hypodensity within the right greater than left occipital lobes. Stable mild atrophy and chronic microvascular ischemic changes. Vascular: Calcified atherosclerosis at the skullbase. No hyperdense vessel. Skull: Normal. Negative for fracture or focal lesion. Sinuses/Orbits: No acute finding. Other: None. CT CERVICAL SPINE FINDINGS Alignment: Trace stepwise anterolisthesis at C3-C4,  C4-C5, and C5-C6 due to facet arthropathy. No traumatic malalignment. Skull base and vertebrae: No acute fracture. No primary bone lesion or focal pathologic process. Soft tissues and spinal canal: No prevertebral fluid or swelling. No visible canal hematoma. Disc levels: Mild to moderate disc height loss and uncovertebral hypertrophy from C5-C6 through C7-T1. Moderate to severe bilateral facet arthropathy throughout the cervical spine. Upper chest: Scarring/fibrosis in the right lung apex, possibly from prior radiation. Other: None. IMPRESSION: 1. No definite acute intracranial abnormality. Apparent hypodensity within the right greater than left occipital lobes is favored artifactual, less likely due to infarct or posterior reversible encephalopathy syndrome. Correlate for visual symptoms or hypertensive urgency. 2. No acute cervical spine fracture. Mild-to-moderate degenerative disc disease in the lower cervical spine. Moderate to severe facet arthropathy throughout the cervical spine. Electronically Signed   By: Titus Dubin M.D.   On: 05/30/2017 12:35   Ct Head Wo Contrast  Result Date: 05/27/2017 CLINICAL DATA:  Fall with head impact EXAM: CT HEAD WITHOUT CONTRAST TECHNIQUE: Contiguous axial images were obtained from the base of the skull through the vertex without intravenous contrast. COMPARISON:  None. FINDINGS: Brain: No mass lesion, intraparenchymal hemorrhage or extra-axial collection. No evidence of acute cortical infarct. There is periventricular hypoattenuation compatible with chronic microvascular disease. Vascular: No hyperdense vessel or unexpected vascular calcification. Skull: Left vertex scalp hematoma.  No skull fracture. Sinuses/Orbits: No sinus fluid levels or advanced mucosal thickening. No mastoid effusion. Normal orbits. IMPRESSION: Small left vertex scalp hematoma without skull fracture or acute intracranial abnormality. Electronically Signed   By: Ulyses Jarred M.D.   On:  05/27/2017 00:44   Ct Cervical Spine Wo Contrast  Result Date: 05/30/2017 CLINICAL DATA:  Ground level fall last night. EXAM: CT HEAD WITHOUT CONTRAST CT CERVICAL SPINE WITHOUT CONTRAST TECHNIQUE: Multidetector CT imaging of the head and cervical spine was performed following the standard protocol without intravenous contrast. Multiplanar CT image reconstructions of the cervical spine were also generated. COMPARISON:  CT head dated May 27, 2017. FINDINGS: CT HEAD FINDINGS Brain: No evidence of acute infarction, hemorrhage, hydrocephalus, extra-axial collection or mass lesion/mass effect. Apparent hypodensity within the right greater than left occipital lobes. Stable mild atrophy and chronic microvascular ischemic changes. Vascular: Calcified atherosclerosis at the skullbase. No hyperdense vessel. Skull: Normal. Negative for fracture or focal lesion. Sinuses/Orbits: No acute finding. Other: None. CT CERVICAL SPINE FINDINGS Alignment: Trace stepwise anterolisthesis at C3-C4, C4-C5, and C5-C6 due to facet arthropathy. No traumatic malalignment. Skull base and vertebrae: No acute fracture. No primary bone lesion or focal pathologic process. Soft tissues and spinal canal: No prevertebral fluid or swelling. No visible canal hematoma. Disc levels:  Mild to moderate disc height loss and uncovertebral hypertrophy from C5-C6 through C7-T1. Moderate to severe bilateral facet arthropathy throughout the cervical spine. Upper chest: Scarring/fibrosis in the right lung apex, possibly from prior radiation. Other: None. IMPRESSION: 1. No definite acute intracranial abnormality. Apparent hypodensity within the right greater than left occipital lobes is favored artifactual, less likely due to infarct or posterior reversible encephalopathy syndrome. Correlate for visual symptoms or hypertensive urgency. 2. No acute cervical spine fracture. Mild-to-moderate degenerative disc disease in the lower cervical spine. Moderate to severe  facet arthropathy throughout the cervical spine. Electronically Signed   By: Titus Dubin M.D.   On: 05/30/2017 12:35   Mr Brain Wo Contrast  Result Date: 05/30/2017 CLINICAL DATA:  Blurry vision for a few days. Multiple recent falls. EXAM: MRI HEAD WITHOUT CONTRAST TECHNIQUE: Multiplanar, multiecho pulse sequences of the brain and surrounding structures were obtained without intravenous contrast. COMPARISON:  Head CT 05/30/2017 and MRI 05/27/2017 FINDINGS: Brain: There are patchy acute infarcts in both occipital lobes which are new from the recent prior MRI and correlate with the hypodensities on today's earlier CT. No intracranial hemorrhage, mass, midline shift, or extra-axial fluid collection is identified. Elsewhere, T2 hyperintensities in the cerebral white matter bilaterally are unchanged from the prior MRI and are nonspecific but compatible with mild chronic small vessel ischemic disease. Generalized cerebral atrophy is not greater than expected for age. Vascular: Major intracranial vascular flow voids are preserved. Skull and upper cervical spine: Unremarkable bone marrow signal. Sinuses/Orbits: Unremarkable orbits. Paranasal sinuses and mastoid air cells are clear. Other: None. IMPRESSION: 1. Acute bilateral occipital lobe infarcts. 2. Mild chronic small vessel ischemic disease. Electronically Signed   By: Logan Bores M.D.   On: 05/30/2017 16:02   Mr Brain Wo Contrast  Result Date: 05/27/2017 CLINICAL DATA:  Loss of balance and falling last night. Gait disturbance and nausea with dizziness. EXAM: MRI HEAD WITHOUT CONTRAST TECHNIQUE: Multiplanar, multiecho pulse sequences of the brain and surrounding structures were obtained without intravenous contrast. COMPARISON:  Head CT same day. FINDINGS: Brain: Diffusion imaging does not show any acute or subacute infarction. The brainstem and cerebellum are normal. Cerebral hemispheres show mild age related volume loss and mild chronic appearing small  vessel change of the white matter, fairly typical for age. No cortical or large vessel territory infarction. No mass lesion, hemorrhage, hydrocephalus or extra-axial collection. Vascular: Major vessels at the base of the brain show flow. Skull and upper cervical spine: Negative Sinuses/Orbits: Clear/normal Other: Left parietal vertex scalp swelling. IMPRESSION: No acute intracranial finding. Age related atrophy and mild chronic small-vessel change of the white matter, fairly typical for age. Electronically Signed   By: Nelson Chimes M.D.   On: 05/27/2017 07:03   Dg Chest Port 1 View  Result Date: 05/27/2017 CLINICAL DATA:  84 year old female with fall.  Elevated troponin. EXAM: PORTABLE CHEST 1 VIEW COMPARISON:  Chest radiograph dated 06/01/2014 FINDINGS: The lungs are clear. There is no pleural effusion or pneumothorax. The cardiac silhouette is within normal limits. No acute osseous pathology. Right axillary surgical clips. IMPRESSION: No active disease. Electronically Signed   By: Anner Crete M.D.   On: 05/27/2017 06:46    Transthoracic Echocardiogram  05/27/2017 Study Conclusions - Procedure narrative: Transthoracic echocardiography. Image quality was poor. The study was technically difficult, as a result of poor sound wave transmission. - Left ventricle: The cavity size was normal. Wall thickness was increased in a pattern of mild LVH. There was mild concentric hypertrophy.  Systolic function was normal. The estimated ejection fraction was in the range of 55% to 60%. Wall motion was normal; there were no regional wall motion abnormalities. Doppler parameters are consistent with abnormal left ventricular relaxation (grade 1 diastolic dysfunction). - Aortic valve: There was mild stenosis. Valve area (VTI): 1.75 cm^2. Valve area (Vmax): 1.8 cm^2. Valve area (Vmean): 1.7 cm^2. - Mitral valve: Severely calcified annulus. Valve area by continuity equation (using LVOT  flow): 1.95 cm^2. - Atrial septum: No defect or patent foramen ovale was identified.   Bilateral Carotid Dopplers  05/31/2017 1-39% ICA stenosis. Vertebral artery flow is antegrade.   Subjective: Feels stable, visual deficits about the same but feels like she's managing well. Mostly worried about husband who has dementia and is at home alone. Denies any chest pain, dyspnea, leg swelling, new neurological deficits, dizziness.   Discharge Exam: Vitals:   06/04/17 0432 06/04/17 0804  BP: (!) 136/58 (!) 143/61  Pulse: 64 71  Resp: 18 17  Temp: 97.8 F (36.6 C) 98.8 F (37.1 C)  SpO2: 97% 96%   General: Pt is alert, awake, not in acute distress Cardiovascular: RRR, S1/S2 +, no rubs, no gallops Respiratory: CTA bilaterally, no wheezing, no rhonchi Abdominal: Soft, NT, ND, bowel sounds + Extremities: No edema, no cyanosis Neuro: EOMi, full ROM with reported diplopia intermittently. Visual acuity diminished despite wearing usual glasses, down to shapes at 68ft. No visual field cuts to confrontation. No other focal neurological deficits.   Labs: Basic Metabolic Panel: Recent Labs  Lab 05/31/17 0524 06/01/17 1033 06/02/17 0412 06/03/17 0418 06/04/17 0333  NA 136 140 142 140 140  K 4.1 4.2 4.8 4.9 5.0  CL 101 109 114* 110 112*  CO2 22 22 24  20* 21*  GLUCOSE 178* 205* 127* 123* 183*  BUN 76* 47* 44* 49* 41*  CREATININE 3.52* 2.07* 2.03* 2.07* 1.76*  CALCIUM 8.2* 7.8* 7.5* 7.0* 7.2*   Liver Function Tests: Recent Labs  Lab 05/31/17 0524  AST 33  ALT 30  ALKPHOS 67  BILITOT 0.9  PROT 5.7*  ALBUMIN 2.9*   CBC: Recent Labs  Lab 05/30/17 1618 05/31/17 0524  WBC  --  9.2  NEUTROABS 7.2  --   HGB  --  10.1*  HCT  --  31.4*  MCV  --  98.4  PLT  --  194   CBG: Recent Labs  Lab 06/03/17 0648 06/03/17 1150 06/03/17 1635 06/03/17 2158 06/04/17 0655  GLUCAP 160* 170* 145* 212* 139*   Urinalysis    Component Value Date/Time   COLORURINE YELLOW 05/31/2017 0905    APPEARANCEUR CLEAR 05/31/2017 0905   LABSPEC 1.012 05/31/2017 0905   PHURINE 6.0 05/31/2017 0905   GLUCOSEU NEGATIVE 05/31/2017 0905   HGBUR SMALL (A) 05/31/2017 0905   BILIRUBINUR NEGATIVE 05/31/2017 0905   KETONESUR NEGATIVE 05/31/2017 0905   PROTEINUR NEGATIVE 05/31/2017 0905   UROBILINOGEN 0.2 09/13/2013 0523   NITRITE NEGATIVE 05/31/2017 0905   LEUKOCYTESUR LARGE (A) 05/31/2017 0905    Time coordinating discharge: Approximately 40 minutes  Vance Gather, MD  [facility] 06/04/2017, 11:43 AM Pager [phone]

## 2017-06-05 DIAGNOSIS — N183 Chronic kidney disease, stage 3 (moderate): Secondary | ICD-10-CM | POA: Diagnosis not present

## 2017-06-05 DIAGNOSIS — M6281 Muscle weakness (generalized): Secondary | ICD-10-CM | POA: Diagnosis not present

## 2017-06-05 DIAGNOSIS — I63539 Cerebral infarction due to unspecified occlusion or stenosis of unspecified posterior cerebral artery: Secondary | ICD-10-CM | POA: Diagnosis not present

## 2017-06-05 DIAGNOSIS — R079 Chest pain, unspecified: Secondary | ICD-10-CM | POA: Diagnosis not present

## 2017-06-09 DIAGNOSIS — M6281 Muscle weakness (generalized): Secondary | ICD-10-CM | POA: Diagnosis not present

## 2017-06-09 DIAGNOSIS — I63539 Cerebral infarction due to unspecified occlusion or stenosis of unspecified posterior cerebral artery: Secondary | ICD-10-CM | POA: Diagnosis not present

## 2017-06-09 DIAGNOSIS — R079 Chest pain, unspecified: Secondary | ICD-10-CM | POA: Diagnosis not present

## 2017-06-09 DIAGNOSIS — E119 Type 2 diabetes mellitus without complications: Secondary | ICD-10-CM | POA: Diagnosis not present

## 2017-06-10 DIAGNOSIS — E119 Type 2 diabetes mellitus without complications: Secondary | ICD-10-CM | POA: Diagnosis not present

## 2017-06-10 DIAGNOSIS — N179 Acute kidney failure, unspecified: Secondary | ICD-10-CM | POA: Diagnosis not present

## 2017-06-10 DIAGNOSIS — M6281 Muscle weakness (generalized): Secondary | ICD-10-CM | POA: Diagnosis not present

## 2017-06-10 DIAGNOSIS — I63539 Cerebral infarction due to unspecified occlusion or stenosis of unspecified posterior cerebral artery: Secondary | ICD-10-CM | POA: Diagnosis not present

## 2017-06-12 DIAGNOSIS — R079 Chest pain, unspecified: Secondary | ICD-10-CM | POA: Diagnosis not present

## 2017-06-12 DIAGNOSIS — I63539 Cerebral infarction due to unspecified occlusion or stenosis of unspecified posterior cerebral artery: Secondary | ICD-10-CM | POA: Diagnosis not present

## 2017-06-12 DIAGNOSIS — K5900 Constipation, unspecified: Secondary | ICD-10-CM | POA: Diagnosis not present

## 2017-06-12 DIAGNOSIS — M6281 Muscle weakness (generalized): Secondary | ICD-10-CM | POA: Diagnosis not present

## 2017-06-16 DIAGNOSIS — M5431 Sciatica, right side: Secondary | ICD-10-CM | POA: Diagnosis not present

## 2017-06-16 DIAGNOSIS — E1340 Other specified diabetes mellitus with diabetic neuropathy, unspecified: Secondary | ICD-10-CM | POA: Diagnosis not present

## 2017-06-16 DIAGNOSIS — R52 Pain, unspecified: Secondary | ICD-10-CM | POA: Diagnosis not present

## 2017-06-19 DIAGNOSIS — M6281 Muscle weakness (generalized): Secondary | ICD-10-CM | POA: Diagnosis not present

## 2017-06-19 DIAGNOSIS — R079 Chest pain, unspecified: Secondary | ICD-10-CM | POA: Diagnosis not present

## 2017-06-19 DIAGNOSIS — I63539 Cerebral infarction due to unspecified occlusion or stenosis of unspecified posterior cerebral artery: Secondary | ICD-10-CM | POA: Diagnosis not present

## 2017-06-19 DIAGNOSIS — I10 Essential (primary) hypertension: Secondary | ICD-10-CM | POA: Diagnosis not present

## 2017-06-24 ENCOUNTER — Encounter (HOSPITAL_COMMUNITY): Payer: Self-pay

## 2017-06-24 ENCOUNTER — Emergency Department (HOSPITAL_COMMUNITY): Payer: Medicare Other

## 2017-06-24 ENCOUNTER — Observation Stay (HOSPITAL_COMMUNITY)
Admission: EM | Admit: 2017-06-24 | Discharge: 2017-06-27 | Disposition: A | Discharge status: Home / Self Care | Payer: Medicare Other | Attending: Family Medicine | Admitting: Family Medicine

## 2017-06-24 DIAGNOSIS — E7800 Pure hypercholesterolemia, unspecified: Secondary | ICD-10-CM | POA: Diagnosis not present

## 2017-06-24 DIAGNOSIS — M479 Spondylosis, unspecified: Secondary | ICD-10-CM | POA: Insufficient documentation

## 2017-06-24 DIAGNOSIS — R0902 Hypoxemia: Secondary | ICD-10-CM | POA: Diagnosis not present

## 2017-06-24 DIAGNOSIS — R7989 Other specified abnormal findings of blood chemistry: Secondary | ICD-10-CM | POA: Diagnosis not present

## 2017-06-24 DIAGNOSIS — I10 Essential (primary) hypertension: Secondary | ICD-10-CM | POA: Diagnosis present

## 2017-06-24 DIAGNOSIS — J9601 Acute respiratory failure with hypoxia: Secondary | ICD-10-CM | POA: Diagnosis not present

## 2017-06-24 DIAGNOSIS — R0682 Tachypnea, not elsewhere classified: Secondary | ICD-10-CM | POA: Diagnosis not present

## 2017-06-24 DIAGNOSIS — I5032 Chronic diastolic (congestive) heart failure: Secondary | ICD-10-CM | POA: Diagnosis present

## 2017-06-24 DIAGNOSIS — Z8673 Personal history of transient ischemic attack (TIA), and cerebral infarction without residual deficits: Secondary | ICD-10-CM | POA: Diagnosis not present

## 2017-06-24 DIAGNOSIS — D631 Anemia in chronic kidney disease: Secondary | ICD-10-CM | POA: Diagnosis not present

## 2017-06-24 DIAGNOSIS — I130 Hypertensive heart and chronic kidney disease with heart failure and stage 1 through stage 4 chronic kidney disease, or unspecified chronic kidney disease: Secondary | ICD-10-CM | POA: Insufficient documentation

## 2017-06-24 DIAGNOSIS — Z7984 Long term (current) use of oral hypoglycemic drugs: Secondary | ICD-10-CM | POA: Insufficient documentation

## 2017-06-24 DIAGNOSIS — R1110 Vomiting, unspecified: Secondary | ICD-10-CM | POA: Diagnosis present

## 2017-06-24 DIAGNOSIS — Z923 Personal history of irradiation: Secondary | ICD-10-CM | POA: Insufficient documentation

## 2017-06-24 DIAGNOSIS — N184 Chronic kidney disease, stage 4 (severe): Secondary | ICD-10-CM | POA: Diagnosis not present

## 2017-06-24 DIAGNOSIS — R0602 Shortness of breath: Secondary | ICD-10-CM

## 2017-06-24 DIAGNOSIS — R000 Tachycardia, unspecified: Secondary | ICD-10-CM | POA: Diagnosis not present

## 2017-06-24 DIAGNOSIS — N179 Acute kidney failure, unspecified: Secondary | ICD-10-CM | POA: Diagnosis present

## 2017-06-24 DIAGNOSIS — E1122 Type 2 diabetes mellitus with diabetic chronic kidney disease: Secondary | ICD-10-CM

## 2017-06-24 DIAGNOSIS — Z86711 Personal history of pulmonary embolism: Secondary | ICD-10-CM | POA: Diagnosis present

## 2017-06-24 DIAGNOSIS — R778 Other specified abnormalities of plasma proteins: Secondary | ICD-10-CM

## 2017-06-24 DIAGNOSIS — R112 Nausea with vomiting, unspecified: Secondary | ICD-10-CM | POA: Diagnosis not present

## 2017-06-24 DIAGNOSIS — C50411 Malignant neoplasm of upper-outer quadrant of right female breast: Secondary | ICD-10-CM | POA: Insufficient documentation

## 2017-06-24 DIAGNOSIS — E119 Type 2 diabetes mellitus without complications: Secondary | ICD-10-CM

## 2017-06-24 DIAGNOSIS — Z86718 Personal history of other venous thrombosis and embolism: Secondary | ICD-10-CM | POA: Diagnosis not present

## 2017-06-24 DIAGNOSIS — M109 Gout, unspecified: Secondary | ICD-10-CM | POA: Diagnosis not present

## 2017-06-24 DIAGNOSIS — Z7982 Long term (current) use of aspirin: Secondary | ICD-10-CM | POA: Diagnosis not present

## 2017-06-24 HISTORY — DX: Malignant neoplasm of unspecified site of right female breast: C50.911

## 2017-06-24 HISTORY — DX: Basal cell carcinoma of skin of unspecified parts of face: C44.310

## 2017-06-24 HISTORY — DX: Other pulmonary embolism without acute cor pulmonale: I26.99

## 2017-06-24 HISTORY — DX: Gout, unspecified: M10.9

## 2017-06-24 HISTORY — DX: Acute embolism and thrombosis of unspecified deep veins of unspecified lower extremity: I82.409

## 2017-06-24 HISTORY — DX: Cerebral infarction, unspecified: I63.9

## 2017-06-24 LAB — COMPREHENSIVE METABOLIC PANEL
ALT: 29 U/L (ref 14–54)
AST: 30 U/L (ref 15–41)
Albumin: 3.4 g/dL — ABNORMAL LOW (ref 3.5–5.0)
Alkaline Phosphatase: 98 U/L (ref 38–126)
Anion gap: 14 (ref 5–15)
BUN: 45 mg/dL — ABNORMAL HIGH (ref 6–20)
CO2: 21 mmol/L — ABNORMAL LOW (ref 22–32)
Calcium: 8.7 mg/dL — ABNORMAL LOW (ref 8.9–10.3)
Chloride: 101 mmol/L (ref 101–111)
Creatinine, Ser: 1.83 mg/dL — ABNORMAL HIGH (ref 0.44–1.00)
GFR calc Af Amer: 27 mL/min — ABNORMAL LOW (ref >60)
GFR calc non Af Amer: 24 mL/min — ABNORMAL LOW (ref >60)
Glucose, Bld: 201 mg/dL — ABNORMAL HIGH (ref 65–99)
Potassium: 3.9 mmol/L (ref 3.5–5.1)
Sodium: 136 mmol/L (ref 135–145)
Total Bilirubin: 0.8 mg/dL (ref 0.3–1.2)
Total Protein: 6.5 g/dL (ref 6.5–8.1)

## 2017-06-24 LAB — CBC WITH DIFFERENTIAL/PLATELET
Basophils Absolute: 0 10*3/uL (ref 0.0–0.1)
Basophils Relative: 0 %
Eosinophils Absolute: 0.1 10*3/uL (ref 0.0–0.7)
Eosinophils Relative: 1 %
HCT: 34.7 % — ABNORMAL LOW (ref 36.0–46.0)
Hemoglobin: 11 g/dL — ABNORMAL LOW (ref 12.0–15.0)
Lymphocytes Relative: 14 %
Lymphs Abs: 2.1 10*3/uL (ref 0.7–4.0)
MCH: 31.4 pg (ref 26.0–34.0)
MCHC: 31.7 g/dL (ref 30.0–36.0)
MCV: 99.1 fL (ref 78.0–100.0)
Monocytes Absolute: 0.8 10*3/uL (ref 0.1–1.0)
Monocytes Relative: 5 %
Neutro Abs: 11.5 10*3/uL — ABNORMAL HIGH (ref 1.7–7.7)
Neutrophils Relative %: 80 %
Platelets: 241 10*3/uL (ref 150–400)
RBC: 3.5 MIL/uL — ABNORMAL LOW (ref 3.87–5.11)
RDW: 15.2 % (ref 11.5–15.5)
WBC: 14.5 10*3/uL — ABNORMAL HIGH (ref 4.0–10.5)

## 2017-06-24 LAB — TROPONIN I: Troponin I: 0.04 ng/mL (ref <0.03)

## 2017-06-24 LAB — LIPASE, BLOOD: Lipase: 67 U/L — ABNORMAL HIGH (ref 11–51)

## 2017-06-24 MED ORDER — ONDANSETRON HCL 4 MG/2ML IJ SOLN
4.0000 mg | Freq: Once | INTRAMUSCULAR | Status: AC
  Administered 2017-06-24: 4 mg via INTRAVENOUS
  Filled 2017-06-24: qty 2

## 2017-06-24 MED ORDER — ALBUTEROL SULFATE (2.5 MG/3ML) 0.083% IN NEBU
5.0000 mg | INHALATION_SOLUTION | Freq: Once | RESPIRATORY_TRACT | Status: AC
  Administered 2017-06-24: 5 mg via RESPIRATORY_TRACT
  Filled 2017-06-24: qty 6

## 2017-06-24 NOTE — ED Provider Notes (Addendum)
[facility] Provider Note   CSN: [id number] Arrival date & time: 06/24/17  2017     History   Chief Complaint Chief Complaint  Patient presents with  . Shortness of Breath    HPI Sabrina [name] is a 84 y.o. female.  HPI Recent recently hospitalized for occipital stroke.  Just released from rehab facility.  Was in her normal state of health when she began having coughing and vomiting which started acutely at 6:15 PM this evening.  Patient endorses shortness of breath associated with this.  Denies any new visual changes, focal weakness or numbness.  Denies chest pain or abdominal pain. Past Medical History:  Diagnosis Date  . Arthritis    "mostly in my hands, lower back" (06/25/2017)  . Basal cell carcinoma (BCC) of face 1983  . Breast cancer, right breast ([facility]) 03/2013  . Chronic kidney disease (CKD), stage III (moderate) (H[facility])    nephrologist, Dr. [name]  . Dental crowns present   . DVT (deep venous thrombosis) ([place]) ~ 06/2013   "? side"  . Gout    "on daily RX" (06/25/2017)  . Heart murmur    no known problems; states did not know she had murmur until age 67  . High cholesterol   . Hypertension    fluctuates, especially when stressed; has been on med. > 20 yr.  . Immature cataract   . Non-insulin dependent type 2 diabetes mellitus ([place])   . Pulmonary embolism ([place]) ~ 06/2013  . Radiation 09/06/13-10/20/13   Right Breast Cancer  . Stroke ([place]) 05/2017   just visual problems since (06/25/2017)  . Wears partial dentures    lower    Patient Active Problem List   Diagnosis Date Noted  . Blurry vision, bilateral   . Acute blood loss anemia   . History of breast cancer 05/30/2017  . Weakness 05/30/2017  . Occipital infarction ([place]) 05/30/2017  . Pressure injury of skin 05/28/2017  . Fall at home 05/27/2017  . CKD (chronic kidney disease), stage IV ([place]) 05/27/2017  . High cholesterol 05/27/2017  . DM (diabetes mellitus) ([place])  05/27/2017  . Hypertension 05/27/2017  . Gout 05/27/2017  . Elevated troponin 05/27/2017  . Lipoma of lower extremity 09/12/2014  . Abnormal x-ray 03/15/2014  . Chronic diastolic congestive heart failure ([place]) 02/23/2014  . Osteopenia 11/30/2013  . Hypoglycemia 09/13/2013  . Acute respiratory failure with hypoxia ([place]) 06/14/2013  . History of pulmonary embolism 06/10/2013  . Nausea and vomiting 06/10/2013  . Accelerated hypertension 06/10/2013  . Aortic stenosis 04/22/2013  . Edema leg 04/22/2013  . Breast cancer of upper-outer quadrant of right female breast ([place]) 03/19/2013    Past Surgical History:  Procedure Laterality Date  . AXILLARY LYMPH NODE DISSECTION Right 03/30/2013   Procedure: AXILLARY LYMPH NODE DISSECTION;  Surgeon: Rolm Bookbinder, MD;  Location: [facility];  Service: General;  Laterality: Right;  . BASAL CELL CARCINOMA EXCISION  1983   "face"  . BREAST BIOPSY Right 03/2013  . BREAST CYST EXCISION Right 11/1958   benign  . BREAST LUMPECTOMY WITH NEEDLE LOCALIZATION Right 03/30/2013   Procedure: BREAST LUMPECTOMY WITH NEEDLE LOCALIZATION;  Surgeon: Rolm Bookbinder, MD;  Location: [facility];  Service: General;  Laterality: Right;  . DILATION AND CURETTAGE OF UTERUS    . PORT-A-CATH REMOVAL  2016  . PORTACATH PLACEMENT N/A 04/15/2013   Procedure: INSERTION PORT-A-CATH;  Surgeon: Rolm Bookbinder, MD;  Location: [facility];  Service: General;  Laterality: N/A;  . RE-EXCISION OF BREAST CANCER,SUPERIOR MARGINS Right 04/15/2013   Procedure: RE-EXCISION OF RIGHT BREAST  MARGINS;  Surgeon: Rolm Bookbinder, MD;  Location: [facility];  Service: General;  Laterality: Right;  . TONSILLECTOMY  ~ 1935/1936     OB History   None    Obstetric Comments  Menarche ae 14 No children Married 64 years         Home Medications    Prior to Admission medications   Medication Sig Start Date End Date Taking? Authorizing Provider  allopurinol  (ZYLOPRIM) 100 MG tablet Take 100 mg by mouth daily.  01/17/16  Yes [provider]  anastrozole (ARIMIDEX) 1 MG tablet Take 1 tablet (1 mg total) by mouth daily. 07/30/16  Yes Magrinat, Virgie Dad, MD  aspirin EC 325 MG EC tablet Take 1 tablet (325 mg total) by mouth daily. 06/04/17  Yes Patrecia Pour, MD  atorvastatin (LIPITOR) 40 MG tablet Take 1 tablet (40 mg total) by mouth every evening. 06/04/17  Yes Patrecia Pour, MD  BIOTIN 5000 PO Take 1 tablet by mouth daily.   Yes [provider]  cholecalciferol (VITAMIN D) 1000 UNITS tablet Take 1,000 Units by mouth daily.   Yes [provider]  cloNIDine (CATAPRES) 0.2 MG tablet Take 0.2 mg by mouth 2 (two) times daily.   Yes [provider]  colchicine 0.6 MG tablet Take 0.6 mg by mouth as needed (GOUT).    Yes [provider]  docusate sodium (COLACE) 100 MG capsule Take 100 mg by mouth 2 (two) times daily as needed for mild constipation. Reported on 03/21/2015   Yes [provider]  ferrous sulfate 325 (65 FE) MG tablet Take 325 mg by mouth daily with breakfast.   Yes [provider]  furosemide (LASIX) 40 MG tablet TAKE 40 mg  TABLET EVERY OTHER DAY 06/04/17  Yes Patrecia Pour, MD  gabapentin (NEURONTIN) 100 MG capsule Take 200 mg by mouth 3 (three) times daily.  05/19/17  Yes [provider]  glipiZIDE (GLUCOTROL) 2.5 mg TABS tablet Take 0.5 tablets (2.5 mg total) by mouth 2 (two) times daily before a meal. Patient taking differently: Take 2.5 mg by mouth See admin instructions. Take 2.5 in the morning and 1.25 09/14/13  Yes Buriev, Arie Sabina, MD  polyethylene glycol (MIRALAX / GLYCOLAX) packet Take 17 g by mouth daily as needed for mild constipation.   Yes [provider]  traMADol (ULTRAM) 50 MG tablet Take 50 mg by mouth every 6 (six) hours as needed.   Yes [provider]  verapamil (VERELAN PM) 240 MG 24 hr capsule Take 240 mg by mouth daily.    Yes [provider]  vitamin C (ASCORBIC ACID) 500 MG tablet Take 500 mg by mouth daily.   Yes [provider]    Family History Family History  Problem Relation Age of Onset  . Pneumonia Mother   . Heart attack Father   . Breast cancer Other 95       niece  . Breast cancer Other 55       niece    Social History Social History   Tobacco Use  . Smoking status: Never Smoker  . Smokeless tobacco: Never Used  . Tobacco comment: only smoked 2 packs cigarettes total; husband quit in 1971  Substance Use Topics  . Alcohol use: Yes    Alcohol/week: 1.8 oz    Types: 3 Glasses of wine  per week  . Drug use: No     Allergies   Patient has no known allergies.   Review of Systems Review of Systems  Constitutional: Negative for chills and fever.  HENT: Negative for sore throat and voice change.   Eyes: Negative for visual disturbance.  Respiratory: Positive for cough, shortness of breath and wheezing.   Cardiovascular: Negative for chest pain, palpitations and leg swelling.  Gastrointestinal: Positive for nausea and vomiting. Negative for abdominal pain, constipation and diarrhea.  Genitourinary: Negative for dysuria, flank pain and frequency.  Musculoskeletal: Negative for back pain, myalgias and neck pain.  Neurological: Negative for dizziness, weakness, light-headedness, numbness and headaches.  All other systems reviewed and are negative.    Physical Exam Updated Vital Signs BP 102/67 (BP Location: Left Arm)   Pulse 60   Temp (!) 97.4 F (36.3 C) (Oral)   Resp 18   Ht 4\' 11"  (1.499 m)   Wt 75 kg (165 lb 4 oz)   SpO2 99%   BMI 33.38 kg/m   Physical Exam  Constitutional: She is oriented to person, place, and time. She appears well-developed and well-nourished.  HENT:  Head: Normocephalic and atraumatic.  Mouth/Throat: Oropharyngeal exudate present.  Eyes: Pupils are equal, round, and reactive to light. EOM are normal.  Neck: Normal range of motion. Neck  supple.  Cardiovascular: Normal rate and regular rhythm. Exam reveals no gallop and no friction rub.  No murmur heard. Pulmonary/Chest: Effort normal. She has wheezes.  Rhonchi and wheezing throughout.  Abdominal: Soft. Bowel sounds are normal. There is no tenderness. There is no rebound and no guarding.  Musculoskeletal: Normal range of motion. She exhibits no edema or tenderness.  No lower extremity swelling or asymmetry.  Distal pulses are 2+.  Lymphadenopathy:    She has no cervical adenopathy.  Neurological: She is alert and oriented to person, place, and time.  5/5 motor in all extremities.  Sensation intact.  Skin: Skin is warm and dry. Capillary refill takes less than 2 seconds. No rash noted. No erythema.  Psychiatric: She has a normal mood and affect. Her behavior is normal.  Nursing note and vitals reviewed.    ED Treatments / Results  Labs (all labs ordered are listed, but only abnormal results are displayed) Labs Reviewed  CBC WITH DIFFERENTIAL/PLATELET - Abnormal; Notable for the following components:      Result Value   WBC 14.5 (*)    RBC 3.50 (*)    Hemoglobin 11.0 (*)    HCT 34.7 (*)    Neutro Abs 11.5 (*)    All other components within normal limits  COMPREHENSIVE METABOLIC PANEL - Abnormal; Notable for the following components:   CO2 21 (*)    Glucose, Bld 201 (*)    BUN 45 (*)    Creatinine, Ser 1.83 (*)    Calcium 8.7 (*)    Albumin 3.4 (*)    GFR calc non Af Amer 24 (*)    GFR calc Af Amer 27 (*)    All other components within normal limits  TROPONIN I - Abnormal; Notable for the following components:   Troponin I 0.04 (*)    All other components within normal limits  LIPASE, BLOOD - Abnormal; Notable for the following components:   Lipase 67 (*)    All other components within normal limits  TROPONIN I - Abnormal; Notable for the following components:   Troponin I 0.09 (*)    All other components within normal  limits  CBC WITH  DIFFERENTIAL/PLATELET - Abnormal; Notable for the following components:   WBC 10.7 (*)    RBC 3.24 (*)    Hemoglobin 10.2 (*)    HCT 31.6 (*)    Neutro Abs 8.3 (*)    All other components within normal limits  BASIC METABOLIC PANEL - Abnormal; Notable for the following components:   Glucose, Bld 140 (*)    BUN 45 (*)    Creatinine, Ser 1.86 (*)    Calcium 8.7 (*)    GFR calc non Af Amer 23 (*)    GFR calc Af Amer 27 (*)    All other components within normal limits  GLUCOSE, CAPILLARY - Abnormal; Notable for the following components:   Glucose-Capillary 165 (*)    All other components within normal limits  GLUCOSE, CAPILLARY - Abnormal; Notable for the following components:   Glucose-Capillary 170 (*)    All other components within normal limits  CBG MONITORING, ED - Abnormal; Notable for the following components:   Glucose-Capillary 113 (*)    All other components within normal limits  CBG MONITORING, ED - Abnormal; Notable for the following components:   Glucose-Capillary 160 (*)    All other components within normal limits  MRSA PCR SCREENING  CULTURE, BLOOD (ROUTINE X 2)  CULTURE, BLOOD (ROUTINE X 2)  CULTURE, EXPECTORATED SPUTUM-ASSESSMENT  GRAM STAIN  STREP PNEUMONIAE URINARY ANTIGEN  CBC    EKG EKG Interpretation  Date/Time:  Tuesday June 24 2017 23:01:08 EDT Ventricular Rate:  101 PR Interval:    QRS Duration: 88 QT Interval:  376 QTC Calculation: 488 R Axis:   23 Text Interpretation:  Sinus tachycardia Borderline prolonged QT interval No significant change since last tracing other than rate is faster Confirmed by Pryor Curia [phone]) on 06/25/2017 12:34:57 AM Also confirmed by Ward, Cyril Mourning [phone]), editor [name] [phone])  on 06/25/2017 7:18:17 AM   Radiology  Procedures Procedures (including critical care time)  Medications Ordered in ED Medications  allopurinol (ZYLOPRIM) tablet 100 mg (100 mg Oral Given 06/26/17 0835)  anastrozole (ARIMIDEX)  tablet 1 mg (1 mg Oral Given 06/26/17 0836)  aspirin EC tablet 325 mg (325 mg Oral Given 06/26/17 0835)  atorvastatin (LIPITOR) tablet 40 mg (40 mg Oral Given 06/25/17 1919)  cholecalciferol (VITAMIN D) tablet 1,000 Units (1,000 Units Oral Given 06/26/17 0835)  cloNIDine (CATAPRES) tablet 0.2 mg (0.2 mg Oral Given 06/26/17 0835)  gabapentin (NEURONTIN) capsule 200 mg (200 mg Oral Given 06/26/17 0835)  verapamil (CALAN-SR) CR tablet 240 mg (240 mg Oral Given 06/26/17 0835)  heparin injection 5,000 Units (5,000 Units Subcutaneous Given 06/26/17 0512)  sodium chloride flush (NS) 0.9 % injection 3 mL (3 mLs Intravenous Not Given 06/26/17 0840)  sodium chloride flush (NS) 0.9 % injection 3 mL (3 mLs Intravenous Not Given 06/26/17 0840)  sodium chloride flush (NS) 0.9 % injection 3 mL (has no administration in time range)  0.9 %  sodium chloride infusion (has no administration in time range)  acetaminophen (TYLENOL) tablet 650 mg (has no administration in time range)    Or  acetaminophen (TYLENOL) suppository 650 mg (has no administration in time range)  HYDROcodone-acetaminophen (NORCO/VICODIN) 5-325 MG per tablet 1-2 tablet (has no administration in time range)  polyethylene glycol (MIRALAX / GLYCOLAX) packet 17 g (has no administration in time range)  bisacodyl (DULCOLAX) EC tablet 5 mg (has no administration in time range)  ondansetron (ZOFRAN) tablet 4 mg ( Oral See Alternative 06/26/17 0512)  Or  ondansetron [facility]) injection 4 mg (4 mg Intravenous Given 06/26/17 0512)  insulin aspart (novoLOG) injection 0-9 Units (2 Units Subcutaneous Given 06/26/17 0836)  insulin aspart (novoLOG) injection 0-5 Units (0 Units Subcutaneous Not Given 06/25/17 2137)  albuterol (PROVENTIL) (2.5 MG/3ML) 0.083% nebulizer solution 2.5 mg (2.5 mg Nebulization Given 06/26/17 0514)  furosemide (LASIX) tablet 40 mg (40 mg Oral Given 06/25/17 1233)  piperacillin-tazobactam (ZOSYN) IVPB 3.375 g (3.375 g Intravenous New Bag/Given  06/26/17 0629)  vancomycin (VANCOCIN) 500 mg in sodium chloride 0.9 % 100 mL IVPB (0 mg Intravenous Stopped 06/26/17 0621)  sodium phosphate (FLEET) 7-19 GM/118ML enema 1 enema (1 enema Rectal Not Given 06/25/17 1151)  benzonatate (TESSALON) capsule 200 mg (200 mg Oral Given 06/26/17 0512)  MEDLINE mouth rinse (15 mLs Mouth Rinse Not Given 06/26/17 0839)  albuterol (PROVENTIL) (2.5 MG/3ML) 0.083% nebulizer solution 5 mg (5 mg Nebulization Given 06/24/17 2042)  ondansetron (ZOFRAN) injection 4 mg (4 mg Intravenous Given 06/24/17 2301)  vancomycin (VANCOCIN) IVPB 1000 mg/200 mL premix (0 mg Intravenous Stopped 06/25/17 0636)  technetium TC 34M diethylenetriame-pentaacetic acid (DTPA) injection 30 millicurie (30 millicuries Inhalation Given 06/25/17 1020)  technetium albumin aggregated (MAA) injection solution 6 millicurie (6 millicuries Intravenous Contrast Given 06/25/17 1120)    CRITICAL CARE Performed by: [name] Total critical care time: 30 minutes Critical care time was exclusive of separately billable procedures and treating other patients. Critical care was necessary to treat or prevent imminent or life-threatening deterioration. Critical care was time spent personally by me on the following activities: development of treatment plan with patient and/or surrogate as well as nursing, discussions with consultants, evaluation of patient's response to treatment, examination of patient, obtaining history from patient or surrogate, ordering and performing treatments and interventions, ordering and review of laboratory studies, ordering and review of radiographic studies, pulse oximetry and re-evaluation of patient's condition. Initial Impression / Assessment and Plan / ED Course  I have reviewed the triage vital signs and the nursing notes.  Pertinent labs & imaging results that were available during my care of the patient were reviewed by me and considered in my medical decision making (see chart  for details).    Patient received breathing treatment in the emergency department with improvement of her shortness of breath.  Wheezing has significantly improved.  No further nausea or vomiting.  Patient states she is feeling back to her baseline.  Initial troponin is mildly elevated.  We will get repeat troponin and reevaluate once patient is taken off supplemental oxygen.  Does not wear oxygen at baseline.  Signed out to oncoming emergency provider.  Final Clinical Impressions(s) / ED Diagnoses   Final diagnoses:  SOB (shortness of breath)  Elevated troponin  Hypoxia    ED Discharge Orders    None       [name], MD 06/26/17 2202    [name], MD 07/04/17 [phone]

## 2017-06-24 NOTE — ED Triage Notes (Signed)
At 615 today pt was with home health nurse and began to have SOB and coughing with gaging spell. Pt skin feel cool and clammy. Pt on 2l of o2 due to stats being in low 90's. Pt axox4.

## 2017-06-25 ENCOUNTER — Encounter (HOSPITAL_COMMUNITY): Payer: Self-pay | Admitting: Family Medicine

## 2017-06-25 ENCOUNTER — Other Ambulatory Visit: Payer: Self-pay

## 2017-06-25 ENCOUNTER — Observation Stay (HOSPITAL_COMMUNITY): Payer: Medicare Other

## 2017-06-25 DIAGNOSIS — R748 Abnormal levels of other serum enzymes: Secondary | ICD-10-CM | POA: Diagnosis not present

## 2017-06-25 DIAGNOSIS — C50411 Malignant neoplasm of upper-outer quadrant of right female breast: Secondary | ICD-10-CM | POA: Diagnosis not present

## 2017-06-25 DIAGNOSIS — Z170 Estrogen receptor positive status [ER+]: Secondary | ICD-10-CM | POA: Diagnosis not present

## 2017-06-25 DIAGNOSIS — E118 Type 2 diabetes mellitus with unspecified complications: Secondary | ICD-10-CM | POA: Diagnosis not present

## 2017-06-25 DIAGNOSIS — Z86711 Personal history of pulmonary embolism: Secondary | ICD-10-CM

## 2017-06-25 DIAGNOSIS — I5032 Chronic diastolic (congestive) heart failure: Secondary | ICD-10-CM | POA: Diagnosis not present

## 2017-06-25 DIAGNOSIS — R140 Abdominal distension (gaseous): Secondary | ICD-10-CM | POA: Diagnosis not present

## 2017-06-25 DIAGNOSIS — N184 Chronic kidney disease, stage 4 (severe): Secondary | ICD-10-CM | POA: Diagnosis not present

## 2017-06-25 DIAGNOSIS — R0600 Dyspnea, unspecified: Secondary | ICD-10-CM | POA: Diagnosis not present

## 2017-06-25 DIAGNOSIS — J9601 Acute respiratory failure with hypoxia: Secondary | ICD-10-CM | POA: Diagnosis not present

## 2017-06-25 LAB — CBC WITH DIFFERENTIAL/PLATELET
Basophils Absolute: 0 10*3/uL (ref 0.0–0.1)
Basophils Relative: 0 %
Eosinophils Absolute: 0 10*3/uL (ref 0.0–0.7)
Eosinophils Relative: 0 %
HCT: 31.6 % — ABNORMAL LOW (ref 36.0–46.0)
Hemoglobin: 10.2 g/dL — ABNORMAL LOW (ref 12.0–15.0)
Lymphocytes Relative: 18 %
Lymphs Abs: 2 10*3/uL (ref 0.7–4.0)
MCH: 31.5 pg (ref 26.0–34.0)
MCHC: 32.3 g/dL (ref 30.0–36.0)
MCV: 97.5 fL (ref 78.0–100.0)
Monocytes Absolute: 0.4 10*3/uL (ref 0.1–1.0)
Monocytes Relative: 4 %
Neutro Abs: 8.3 10*3/uL — ABNORMAL HIGH (ref 1.7–7.7)
Neutrophils Relative %: 78 %
Platelets: 254 10*3/uL (ref 150–400)
RBC: 3.24 MIL/uL — ABNORMAL LOW (ref 3.87–5.11)
RDW: 15.4 % (ref 11.5–15.5)
WBC: 10.7 10*3/uL — ABNORMAL HIGH (ref 4.0–10.5)

## 2017-06-25 LAB — BASIC METABOLIC PANEL
Anion gap: 10 (ref 5–15)
BUN: 45 mg/dL — ABNORMAL HIGH (ref 6–20)
CO2: 24 mmol/L (ref 22–32)
Calcium: 8.7 mg/dL — ABNORMAL LOW (ref 8.9–10.3)
Chloride: 104 mmol/L (ref 101–111)
Creatinine, Ser: 1.86 mg/dL — ABNORMAL HIGH (ref 0.44–1.00)
GFR calc Af Amer: 27 mL/min — ABNORMAL LOW (ref >60)
GFR calc non Af Amer: 23 mL/min — ABNORMAL LOW (ref >60)
Glucose, Bld: 140 mg/dL — ABNORMAL HIGH (ref 65–99)
Potassium: 4.2 mmol/L (ref 3.5–5.1)
Sodium: 138 mmol/L (ref 135–145)

## 2017-06-25 LAB — CBG MONITORING, ED
Glucose-Capillary: 113 mg/dL — ABNORMAL HIGH (ref 65–99)
Glucose-Capillary: 160 mg/dL — ABNORMAL HIGH (ref 65–99)

## 2017-06-25 LAB — MRSA PCR SCREENING: MRSA by PCR: NEGATIVE

## 2017-06-25 LAB — TROPONIN I: Troponin I: 0.09 ng/mL (ref <0.03)

## 2017-06-25 LAB — GLUCOSE, CAPILLARY: Glucose-Capillary: 165 mg/dL — ABNORMAL HIGH (ref 65–99)

## 2017-06-25 LAB — STREP PNEUMONIAE URINARY ANTIGEN: Strep Pneumo Urinary Antigen: NEGATIVE

## 2017-06-25 MED ORDER — ACETAMINOPHEN 325 MG PO TABS: 650.0000 mg | ORAL_TABLET | Freq: Four times a day (QID) | ORAL | Status: DC | PRN

## 2017-06-25 MED ORDER — GABAPENTIN 100 MG PO CAPS
200.0000 mg | ORAL_CAPSULE | Freq: Three times a day (TID) | ORAL | Status: DC
  Administered 2017-06-25 – 2017-06-27 (×6): 200 mg via ORAL
  Filled 2017-06-25 (×7): qty 2

## 2017-06-25 MED ORDER — VANCOMYCIN HCL 500 MG IV SOLR
500.0000 mg | INTRAVENOUS | Status: DC
  Administered 2017-06-26: 500 mg via INTRAVENOUS
  Filled 2017-06-25: qty 500

## 2017-06-25 MED ORDER — POLYETHYLENE GLYCOL 3350 17 G PO PACK: 17.0000 g | PACK | Freq: Every day | ORAL | Status: DC | PRN

## 2017-06-25 MED ORDER — ONDANSETRON HCL 4 MG PO TABS: 4.0000 mg | ORAL_TABLET | Freq: Four times a day (QID) | ORAL | Status: DC | PRN

## 2017-06-25 MED ORDER — BENZONATATE 100 MG PO CAPS
200.0000 mg | ORAL_CAPSULE | Freq: Three times a day (TID) | ORAL | Status: DC | PRN
  Administered 2017-06-26: 200 mg via ORAL
  Filled 2017-06-25: qty 2

## 2017-06-25 MED ORDER — BISACODYL 5 MG PO TBEC: 5.0000 mg | DELAYED_RELEASE_TABLET | Freq: Every day | ORAL | Status: DC | PRN

## 2017-06-25 MED ORDER — FUROSEMIDE 40 MG PO TABS
40.0000 mg | ORAL_TABLET | ORAL | Status: DC
  Administered 2017-06-25: 40 mg via ORAL
  Filled 2017-06-25: qty 1

## 2017-06-25 MED ORDER — ALLOPURINOL 100 MG PO TABS
100.0000 mg | ORAL_TABLET | Freq: Every day | ORAL | Status: DC
  Administered 2017-06-25 – 2017-06-27 (×3): 100 mg via ORAL
  Filled 2017-06-25 (×3): qty 1

## 2017-06-25 MED ORDER — INSULIN ASPART 100 UNIT/ML ~~LOC~~ SOLN: 0.0000 [IU] | Freq: Every day | SUBCUTANEOUS | Status: DC

## 2017-06-25 MED ORDER — CLONIDINE HCL 0.2 MG PO TABS
0.2000 mg | ORAL_TABLET | Freq: Two times a day (BID) | ORAL | Status: DC
  Administered 2017-06-25 – 2017-06-27 (×5): 0.2 mg via ORAL
  Filled 2017-06-25 (×5): qty 1

## 2017-06-25 MED ORDER — INSULIN ASPART 100 UNIT/ML ~~LOC~~ SOLN
0.0000 [IU] | Freq: Three times a day (TID) | SUBCUTANEOUS | Status: DC
  Administered 2017-06-25 – 2017-06-26 (×2): 2 [IU] via SUBCUTANEOUS
  Administered 2017-06-26 – 2017-06-27 (×3): 1 [IU] via SUBCUTANEOUS
  Filled 2017-06-25: qty 1

## 2017-06-25 MED ORDER — PIPERACILLIN-TAZOBACTAM 3.375 G IVPB
3.3750 g | Freq: Three times a day (TID) | INTRAVENOUS | Status: DC
  Administered 2017-06-25 – 2017-06-26 (×4): 3.375 g via INTRAVENOUS
  Filled 2017-06-25 (×5): qty 50

## 2017-06-25 MED ORDER — HYDROCODONE-ACETAMINOPHEN 5-325 MG PO TABS
1.0000 | ORAL_TABLET | ORAL | Status: DC | PRN
Start: 2017-06-25 — End: 2017-06-27

## 2017-06-25 MED ORDER — TECHNETIUM TO 99M ALBUMIN AGGREGATED
6.0000 | Freq: Once | INTRAVENOUS | Status: AC | PRN
  Administered 2017-06-25: 6 via INTRAVENOUS

## 2017-06-25 MED ORDER — SODIUM CHLORIDE 0.9 % IV SOLN: 250.0000 mL | INTRAVENOUS | Status: DC | PRN

## 2017-06-25 MED ORDER — SODIUM CHLORIDE 0.9% FLUSH: 3.0000 mL | INTRAVENOUS | Status: DC | PRN

## 2017-06-25 MED ORDER — ASPIRIN EC 325 MG PO TBEC
325.0000 mg | DELAYED_RELEASE_TABLET | Freq: Every day | ORAL | Status: DC
  Administered 2017-06-25 – 2017-06-27 (×3): 325 mg via ORAL
  Filled 2017-06-25 (×3): qty 1

## 2017-06-25 MED ORDER — TECHNETIUM TC 99M DIETHYLENETRIAME-PENTAACETIC ACID
30.0000 | Freq: Once | INTRAVENOUS | Status: AC | PRN
  Administered 2017-06-25: 30 via RESPIRATORY_TRACT

## 2017-06-25 MED ORDER — FLEET ENEMA 7-19 GM/118ML RE ENEM: 1.0000 | ENEMA | Freq: Once | RECTAL | Status: DC

## 2017-06-25 MED ORDER — ANASTROZOLE 1 MG PO TABS
1.0000 mg | ORAL_TABLET | Freq: Every day | ORAL | Status: DC
  Administered 2017-06-25 – 2017-06-27 (×3): 1 mg via ORAL
  Filled 2017-06-25 (×3): qty 1

## 2017-06-25 MED ORDER — SODIUM CHLORIDE 0.9% FLUSH
3.0000 mL | Freq: Two times a day (BID) | INTRAVENOUS | Status: DC
  Administered 2017-06-25 – 2017-06-27 (×4): 3 mL via INTRAVENOUS

## 2017-06-25 MED ORDER — ONDANSETRON HCL 4 MG/2ML IJ SOLN
4.0000 mg | Freq: Four times a day (QID) | INTRAMUSCULAR | Status: DC | PRN
  Administered 2017-06-25 – 2017-06-26 (×3): 4 mg via INTRAVENOUS
  Filled 2017-06-25 (×3): qty 2

## 2017-06-25 MED ORDER — HEPARIN SODIUM (PORCINE) 5000 UNIT/ML IJ SOLN
5000.0000 [IU] | Freq: Three times a day (TID) | INTRAMUSCULAR | Status: DC
Start: 2017-06-25 — End: 2017-06-27
  Administered 2017-06-25 – 2017-06-27 (×5): 5000 [IU] via SUBCUTANEOUS
  Filled 2017-06-25 (×7): qty 1

## 2017-06-25 MED ORDER — VANCOMYCIN HCL IN DEXTROSE 1-5 GM/200ML-% IV SOLN
1000.0000 mg | Freq: Once | INTRAVENOUS | Status: AC
  Administered 2017-06-25: 1000 mg via INTRAVENOUS
  Filled 2017-06-25: qty 200

## 2017-06-25 MED ORDER — ALBUTEROL SULFATE (2.5 MG/3ML) 0.083% IN NEBU
2.5000 mg | INHALATION_SOLUTION | RESPIRATORY_TRACT | Status: DC | PRN
  Administered 2017-06-26: 2.5 mg via RESPIRATORY_TRACT
  Filled 2017-06-25: qty 3

## 2017-06-25 MED ORDER — VITAMIN D 1000 UNITS PO TABS
1000.0000 [IU] | ORAL_TABLET | Freq: Every day | ORAL | Status: DC
  Administered 2017-06-25 – 2017-06-27 (×3): 1000 [IU] via ORAL
  Filled 2017-06-25 (×3): qty 1

## 2017-06-25 MED ORDER — SODIUM CHLORIDE 0.9% FLUSH
3.0000 mL | Freq: Two times a day (BID) | INTRAVENOUS | Status: DC
  Administered 2017-06-25 (×2): 3 mL via INTRAVENOUS

## 2017-06-25 MED ORDER — ACETAMINOPHEN 650 MG RE SUPP: 650.0000 mg | Freq: Four times a day (QID) | RECTAL | Status: DC | PRN

## 2017-06-25 MED ORDER — ATORVASTATIN CALCIUM 40 MG PO TABS
40.0000 mg | ORAL_TABLET | Freq: Every evening | ORAL | Status: DC
  Administered 2017-06-25 – 2017-06-26 (×2): 40 mg via ORAL
  Filled 2017-06-25 (×2): qty 1

## 2017-06-25 MED ORDER — VERAPAMIL HCL ER 240 MG PO TBCR
240.0000 mg | EXTENDED_RELEASE_TABLET | Freq: Every day | ORAL | Status: DC
  Administered 2017-06-25 – 2017-06-27 (×3): 240 mg via ORAL
  Filled 2017-06-25 (×3): qty 1

## 2017-06-25 NOTE — ED Notes (Signed)
Heart Healthy diet dinner tray ordered. 

## 2017-06-25 NOTE — H&P (Signed)
History and Physical    Sabrina [name]:[id number] DOB: 12-20-1926 DOA: 06/24/2017  PCP: [name], MD   Patient coming from: Home  Chief Complaint: Cough, SOB, low O2 sat, N/V   HPI: Sabrina [name] is a 84 y.o. female with medical history significant for breast cancer status post resection, history of DVT and PE no longer on anticoagulation, history of CVA last month, chronic diastolic CHF, and hypertension, now presenting to the emergency department with shortness of breath and low O2 sats.  Patient recently returned home after rehabilitating after her stroke, has had some nausea with nonbloody vomiting, and then developed acute onset of shortness of breath with low oxygen saturations per the report of her home health RN.  Patient denies chest pain, fevers, or chills.  ED Course: Upon arrival to the ED, patient is found to be afebrile, saturating 85% on room air, mildly tachypneic, mildly tachycardic, and with stable blood pressure.  EKG features a sinus tachycardia with rate 101 and chest x-ray is notable for chronic scarring in the right upper lung, but no acute findings.  Chemistry panel is notable for a creatinine of 1.83, consistent with her apparent baseline.  CBC features a new leukocytosis to 11,500 and stable normocytic anemia with hemoglobin of 11.0.  Troponin is elevated to 0.04, then to 0.09 few hours later.  Patient was treated with albuterol and Zofran in the ED.  She was started on supplemental oxygen.  She remains hemodynamically stable and will be observed on the telemetry unit for ongoing evaluation and management of acute hypoxic respiratory failure.  Review of Systems:  All other systems reviewed and apart from HPI, are negative.  Past Medical History:  Diagnosis Date  . Arthritis    neck  . Breast cancer ([facility]) 03/2013   right  . Chronic kidney disease (CKD), stage III (moderate) (HCC)    nephrologist, Dr. [name]  . Dental crowns present   . Heart  murmur    no known problems; states did not know she had murmur until age 18  . High cholesterol   . Hypertension    fluctuates, especially when stressed; has been on med. > 20 yr.  . Immature cataract   . Non-insulin dependent type 2 diabetes mellitus ([place])   . Radiation 09/06/13-10/20/13   Right Breast Cancer  . Wears partial dentures    lower    Past Surgical History:  Procedure Laterality Date  . AXILLARY LYMPH NODE DISSECTION Right 03/30/2013   Procedure: AXILLARY LYMPH NODE DISSECTION;  Surgeon: Rolm Bookbinder, MD;  Location: [facility];  Service: General;  Laterality: Right;  . BREAST LUMPECTOMY WITH NEEDLE LOCALIZATION Right 03/30/2013   Procedure: BREAST LUMPECTOMY WITH NEEDLE LOCALIZATION;  Surgeon: Rolm Bookbinder, MD;  Location: [facility];  Service: General;  Laterality: Right;  . BREAST SURGERY Right 11/1958   right breast biopsy benign  . DILATION AND CURETTAGE OF UTERUS    . PORTACATH PLACEMENT N/A 04/15/2013   Procedure: INSERTION PORT-A-CATH;  Surgeon: Rolm Bookbinder, MD;  Location: [facility];  Service: General;  Laterality: N/A;  . RE-EXCISION OF BREAST CANCER,SUPERIOR MARGINS Right 04/15/2013   Procedure: RE-EXCISION OF RIGHT BREAST  MARGINS;  Surgeon: Rolm Bookbinder, MD;  Location: [facility];  Service: General;  Laterality: Right;  . TONSILLECTOMY     as a child     reports that she has never smoked. She has never used smokeless tobacco. She reports that she drinks alcohol. She reports that  she does not use drugs.  No Known Allergies  Family History  Problem Relation Age of Onset  . Pneumonia Mother   . Heart attack Father   . Breast cancer Other 73       niece  . Breast cancer Other 50       niece     Prior to Admission medications   Medication Sig Start Date End Date Taking? Authorizing Provider  allopurinol (ZYLOPRIM) 100 MG tablet Take 100 mg by mouth daily.  01/17/16  Yes [provider]  anastrozole  (ARIMIDEX) 1 MG tablet Take 1 tablet (1 mg total) by mouth daily. 07/30/16  Yes Magrinat, Virgie Dad, MD  aspirin EC 325 MG EC tablet Take 1 tablet (325 mg total) by mouth daily. 06/04/17  Yes Patrecia Pour, MD  atorvastatin (LIPITOR) 40 MG tablet Take 1 tablet (40 mg total) by mouth every evening. 06/04/17  Yes Patrecia Pour, MD  BIOTIN 5000 PO Take 1 tablet by mouth daily.   Yes [provider]  cholecalciferol (VITAMIN D) 1000 UNITS tablet Take 1,000 Units by mouth daily.   Yes [provider]  cloNIDine (CATAPRES) 0.2 MG tablet Take 0.2 mg by mouth 2 (two) times daily.   Yes [provider]  colchicine 0.6 MG tablet Take 0.6 mg by mouth as needed (GOUT).    Yes [provider]  docusate sodium (COLACE) 100 MG capsule Take 100 mg by mouth 2 (two) times daily as needed for mild constipation. Reported on 03/21/2015   Yes [provider]  ferrous sulfate 325 (65 FE) MG tablet Take 325 mg by mouth daily with breakfast.   Yes [provider]  furosemide (LASIX) 40 MG tablet TAKE 40 mg  TABLET EVERY OTHER DAY 06/04/17  Yes Patrecia Pour, MD  gabapentin (NEURONTIN) 100 MG capsule Take 200 mg by mouth 3 (three) times daily.  05/19/17  Yes [provider]  glipiZIDE (GLUCOTROL) 2.5 mg TABS tablet Take 0.5 tablets (2.5 mg total) by mouth 2 (two) times daily before a meal. Patient taking differently: Take 2.5 mg by mouth See admin instructions. Take 2.5 in the morning and 1.25 09/14/13  Yes Buriev, Arie Sabina, MD  polyethylene glycol (MIRALAX / GLYCOLAX) packet Take 17 g by mouth daily as needed for mild constipation.   Yes [provider]  traMADol (ULTRAM) 50 MG tablet Take 50 mg by mouth every 6 (six) hours as needed.   Yes [provider]  verapamil (VERELAN PM) 240 MG 24 hr capsule Take 240 mg by mouth daily.    Yes [provider]  vitamin C (ASCORBIC ACID) 500 MG tablet Take 500 mg by mouth daily.   Yes [provider]    Physical Exam: Vitals:   06/25/17 0125 06/25/17 0130 06/25/17 0200 06/25/17 0215  BP:  (!) 153/54 (!) 124/59   Pulse:  94 91 88  Resp:  16 18   Temp:      TempSrc:      SpO2: (!) 86% 98% 99% 99%      Constitutional: NAD, calm, obese  Eyes: PERTLA, lids and conjunctivae normal ENMT: Mucous membranes are moist. Posterior pharynx clear of any exudate or lesions.   Neck: normal, supple, no masses, no thyromegaly Respiratory: Mild tachypnea, mild dyspnea with speech. No wheezes. No accessory muscle use.  Cardiovascular: Rate ~100 and regular. 1+ pretibial edema bilaterally. No significant JVD. Abdomen: No distension, no tenderness, soft. Bowel sounds normal.  Musculoskeletal:  no clubbing / cyanosis. No joint deformity upper and lower extremities.   Skin: no significant rashes, lesions, ulcers. Warm, dry, well-perfused. Neurologic: No facial asymmetry. Sensation to light touch intact. Moving all extremities.  Psychiatric: Alert and oriented x 3. Pleasant and cooperative.     Labs on Admission: I have personally reviewed following labs and imaging studies  CBC: Recent Labs  Lab 06/24/17 2145  WBC 14.5*  NEUTROABS 11.5*  HGB 11.0*  HCT 34.7*  MCV 99.1  PLT 824   Basic Metabolic Panel: Recent Labs  Lab 06/24/17 2145  NA 136  K 3.9  CL 101  CO2 21*  GLUCOSE 201*  BUN 45*  CREATININE 1.83*  CALCIUM 8.7*   GFR: CrCl cannot be calculated (Unknown ideal weight.). Liver Function Tests: Recent Labs  Lab 06/24/17 2145  AST 30  ALT 29  ALKPHOS 98  BILITOT 0.8  PROT 6.5  ALBUMIN 3.4*   Recent Labs  Lab 06/24/17 2145  LIPASE 67*   No results for input(s): AMMONIA in the last 168 hours. Coagulation Profile: No results for input(s): INR, PROTIME in the last 168 hours. Cardiac Enzymes: Recent Labs  Lab 06/24/17 2145 06/25/17 0042  TROPONINI 0.04* 0.09*   BNP (last 3 results) No results for input(s): PROBNP in the last 8760 hours. HbA1C: No results  for input(s): HGBA1C in the last 72 hours. CBG: No results for input(s): GLUCAP in the last 168 hours. Lipid Profile: No results for input(s): CHOL, HDL, LDLCALC, TRIG, CHOLHDL, LDLDIRECT in the last 72 hours. Thyroid Function Tests: No results for input(s): TSH, T4TOTAL, FREET4, T3FREE, THYROIDAB in the last 72 hours. Anemia Panel: No results for input(s): VITAMINB12, FOLATE, FERRITIN, TIBC, IRON, RETICCTPCT in the last 72 hours. Urine analysis:    Component Value Date/Time   COLORURINE YELLOW 05/31/2017 0905   APPEARANCEUR CLEAR 05/31/2017 0905   LABSPEC 1.012 05/31/2017 0905   PHURINE 6.0 05/31/2017 0905   GLUCOSEU NEGATIVE 05/31/2017 0905   HGBUR SMALL (A) 05/31/2017 0905   BILIRUBINUR NEGATIVE 05/31/2017 0905   KETONESUR NEGATIVE 05/31/2017 0905   PROTEINUR NEGATIVE 05/31/2017 0905   UROBILINOGEN 0.2 09/13/2013 0523   NITRITE NEGATIVE 05/31/2017 0905   LEUKOCYTESUR LARGE (A) 05/31/2017 0905   Sepsis Labs: @LABRCNTIP (procalcitonin:4,lacticidven:4) )No results found for this or any previous visit (from the past 240 hour(s)).   Radiological Exams on Admission: Dg Chest 2 View  Result Date: 06/24/2017 CLINICAL DATA:  Shortness of breath for 1 day. History of renal disease, hypertension, diabetes. Nonsmoker. EXAM: CHEST - 2 VIEW COMPARISON:  05/30/2017 FINDINGS: Somewhat shallow inspiration. Heart size and pulmonary vascularity are normal. Linear scarring in the right upper lung. No airspace disease or consolidation. No blunting of costophrenic angles. No pneumothorax. Mediastinal contours appear intact. Degenerative changes in the spine and shoulders. Surgical clips in the right axilla. IMPRESSION: Chronic scarring in the right upper lung. No evidence of active pulmonary disease. Electronically Signed   By: Lucienne Capers M.D.   On: 06/24/2017 21:12    EKG: Independently reviewed. Sinus tachycardia (rate 101).   Assessment/Plan  1. Acute hypoxic respiratory failure  -  Presents with SOB and hypoxia  - Afebrile, CXR without acute findings, leukocytosis noted  - Had been vomiting and aspiration PNA is considered  - In setting of tachycardia and mild troponin elevations with hx of DVT and PE, PE is also a concern  - Obtain cultures, start empiric abx for possible aspiration PNA, check V/Q scan for PE, continue supportive care with supplemental  O2 and prn nebs    2. Elevated troponin  - Troponin elevated to 0.04, then 0.09 without chest pain  - She has new hypoxia and PE is considered, V/Q pending  - Continue cardiac monitoring, trend troponin, follow-up V/Q   3. CKD stage IV - SCr is 1.83 on admission, consistent with her apparent baseline  - Renally-dose medications, avoid nephrotoxins    4. Chronic diastolic CHF  - Appears well-compensated on admission  - Continue Lasix, follow daily wt and I/O's    5. Type II DM  - A1c was 7.4% last month   - Managed with glipizide at home, held on admission   - Check CBG's and use SSI with Novolog while in hospital   6. Breast cancer  - Status-post definitive surgery  - Follows with oncology and managed with anastrozole, now 3 years in to planned 5 yrs of tx    7. Hx of DVT and PE  - Hx of bilateral DVT and PE in April 2015, treated with coumadin, now off of AC  - Presents with hypoxia, CXR clear, has mild tachycardia and mild troponin elevations  - Rule-out PE with V/Q scan     DVT prophylaxis: sq heparin  Code Status: Full  Family Communication: Caregiver updated at bedside Consults called: none Admission status: observation     Vianne Bulls, MD [facility] Pager [phone]  If 7PM-7AM, please contact night-coverage [email or web address] Password [facility]  06/25/2017, 3:33 AM

## 2017-06-25 NOTE — ED Provider Notes (Signed)
1:00 AM  Assumed care from Dr. Lita Mains.  Patient is an 84 year old female who presented to the emergency department shortness of breath nausea and vomiting.  Improved after breathing treatment.  Does have a leukocytosis but no infectious symptoms.  No fever here.  Chest x-ray is clear.  She has history of chronic kidney disease and always has a mildly elevated troponin.  Troponin 0.04 with no chest pain.  Plan is for repeat troponin to make sure there is no rise in her cardiac enzymes from baseline.  EKG shows no new ischemic abnormality.   2:15 AM  Pt's second troponin is rising.  She denies having any chest pain or chest discomfort.  Patient did drop her oxygen saturation to 85% on room air at rest.  Does not wear oxygen at home.  She may have aspirated today.  I feel she will need further monitoring.  Patient is comfortable with this plan.  Will discuss with hospitalist.   2:35 AM Discussed patient's case with hospitalist, Dr. Myna Hidalgo.  I have recommended admission and patient (and family if present) agree with this plan. Admitting physician will place admission orders.   We discussed the possibility of aspiration even the patient does have a normal chest x-ray.  Patient agrees that she may have aspirated while vomiting today.  Her lungs are now clear but given this new oxygen requirement, she will need admission.  Hospitalist will evaluate the patient to determine if antibiotics are indicated.  She is not febrile here but does have a new leukocytosis with left shift.  No signs of sepsis.  I reviewed all nursing notes, vitals, pertinent previous records, EKGs, lab and urine results, imaging (as available).     Sabrina [name], Delice Bison, DO 06/25/17 [id number]

## 2017-06-25 NOTE — ED Notes (Signed)
Patient CBG was 113.

## 2017-06-25 NOTE — Progress Notes (Signed)
Patient admitted after midnight, please see H&P.  Was recently in the hospital for CVA.  Discharged from rehab yesterday to home with home health.  Yesterday PM had episode of N/V.   Constipation: No BM in several days, has not gotten her stool softeners either from the [facility].  Xray shows a possible impaction with large stool burden.  Have ordered suppository and enema with manual disimpaction if needed.    SOB: -V/Q low risk -? Aspiration per ER doc-- nothing seen on x ray -monitor -wean O2 as tolerated  Elevated troponin -cycled -seems to be chronically elevated  PT eval in AM  Eulogio Bear DO

## 2017-06-25 NOTE — ED Notes (Signed)
Carb Modified Diet was ordered for Lunch. 

## 2017-06-25 NOTE — Progress Notes (Signed)
Pharmacy Antibiotic Note  Sabrina [name] is a 84 y.o. female admitted on 06/24/2017 with pneumonia.  Pharmacy has been consulted for Vancomycin/Zosyn dosing. WBC mildly elevated. Noted renal dysfunction.   Plan: Vancomycin 1000 mg IV x 1, then give 500 mg IV q24h Zosyn 3.375G IV q8h to be infused over 4 hours Trend WBC, temp, renal function  F/U infectious work-up Drug levels as indicated   Temp (24hrs), Avg:97.6 F (36.4 C), Min:97.6 F (36.4 C), Max:97.6 F (36.4 C)  Recent Labs  Lab 06/24/17 2145  WBC 14.5*  CREATININE 1.83*    CrCl cannot be calculated (Unknown ideal weight.).    No Known Allergies    Sabrina [name] 06/25/2017 3:50 AM

## 2017-06-25 NOTE — ED Notes (Signed)
Patient sitting up in bed eating lunch - tolerating well at this time.

## 2017-06-25 NOTE — Progress Notes (Signed)
RT called regarding breathing treatment. When RT got up to pt room pt was vomiting, RT told pt we were going to hold off on the treatment until she is no longer nauseas. RT will continue to monitor.

## 2017-06-26 DIAGNOSIS — N184 Chronic kidney disease, stage 4 (severe): Secondary | ICD-10-CM | POA: Diagnosis not present

## 2017-06-26 DIAGNOSIS — I5032 Chronic diastolic (congestive) heart failure: Secondary | ICD-10-CM | POA: Diagnosis not present

## 2017-06-26 DIAGNOSIS — E118 Type 2 diabetes mellitus with unspecified complications: Secondary | ICD-10-CM | POA: Diagnosis not present

## 2017-06-26 DIAGNOSIS — Z86711 Personal history of pulmonary embolism: Secondary | ICD-10-CM | POA: Diagnosis not present

## 2017-06-26 DIAGNOSIS — J9601 Acute respiratory failure with hypoxia: Secondary | ICD-10-CM | POA: Diagnosis not present

## 2017-06-26 DIAGNOSIS — R748 Abnormal levels of other serum enzymes: Secondary | ICD-10-CM | POA: Diagnosis not present

## 2017-06-26 LAB — GLUCOSE, CAPILLARY
Glucose-Capillary: 170 mg/dL — ABNORMAL HIGH (ref 65–99)
Glucose-Capillary: 140 mg/dL — ABNORMAL HIGH (ref 65–99)
Glucose-Capillary: 134 mg/dL — ABNORMAL HIGH (ref 65–99)
Glucose-Capillary: 198 mg/dL — ABNORMAL HIGH (ref 65–99)

## 2017-06-26 MED ORDER — AMOXICILLIN-POT CLAVULANATE 875-125 MG PO TABS
1.0000 | ORAL_TABLET | Freq: Two times a day (BID) | ORAL | Status: DC
  Administered 2017-06-26 – 2017-06-27 (×3): 1 via ORAL
  Filled 2017-06-26 (×3): qty 1

## 2017-06-26 MED ORDER — FUROSEMIDE 40 MG PO TABS: 40.0000 mg | ORAL_TABLET | ORAL | Status: DC

## 2017-06-26 MED ORDER — AMOXICILLIN-POT CLAVULANATE 875-125 MG PO TABS: 1.0000 | ORAL_TABLET | Freq: Two times a day (BID) | ORAL | 0 refills | Status: DC

## 2017-06-26 MED ORDER — SENNA 8.6 MG PO TABS
1.0000 | ORAL_TABLET | Freq: Every day | ORAL | Status: DC
  Administered 2017-06-26 – 2017-06-27 (×2): 8.6 mg via ORAL
  Filled 2017-06-26 (×2): qty 1

## 2017-06-26 MED ORDER — BISACODYL 10 MG RE SUPP
10.0000 mg | Freq: Once | RECTAL | Status: DC
  Filled 2017-06-26: qty 1

## 2017-06-26 MED ORDER — POLYETHYLENE GLYCOL 3350 17 G PO PACK
17.0000 g | PACK | Freq: Every day | ORAL | Status: DC
  Administered 2017-06-26 – 2017-06-27 (×2): 17 g via ORAL
  Filled 2017-06-26 (×2): qty 1

## 2017-06-26 MED ORDER — ORAL CARE MOUTH RINSE
15.0000 mL | Freq: Two times a day (BID) | OROMUCOSAL | Status: DC
  Administered 2017-06-26: 15 mL via OROMUCOSAL

## 2017-06-26 NOTE — Progress Notes (Signed)
Case manager at bedside 

## 2017-06-26 NOTE — Care Management Note (Addendum)
Case Management Note  Patient Details  Name: Sabrina [name] MRN: [id number] Date of Birth: 1926-12-08  Subjective/Objective:   Acute Resp Failure                Action/Plan: Patient recently discharged from [facility]; patient is agreeable to go back to SNF at discharge; [name] made aware, she wants a different SNF; CM will continue to follow for progression of care.  12:57 pm - Received message that patient is in her co pay days and desires to return home at discharge with [facility] services provided by [name]; [name] with [facility] called for arrangements; DME - walker at home; [name]  Expected Discharge Date:    possibly 06/28/2017              Expected Discharge Plan:  Home with [facility] In-House Referral:  Clinical Social Work  Discharge planning Services  CM Consult  Status of Service:  In process, will continue to follow  Sherrilyn Rist [phone] 06/26/2017, 11:36 AM

## 2017-06-26 NOTE — Progress Notes (Signed)
CM talked to patient with her friend at the bedside, IM given to patient with instruction about if she wanted to Appeal her hospital discharge; patient stated that she would think about it but wanted to talk to the Attending MD first to make sure that it would be ok for her to go home today; message sent to the Attending MD; Aneta Mins [phone]

## 2017-06-26 NOTE — Progress Notes (Addendum)
New Admission Note:   Arrival Method: from 5 C Patient arrived nauseous and started vomiting. Ondansetron given and effective. Denies pain. Telemetry: [address], NSR Pain:0/10 Safety Measures: Safety Fall Prevention Plan has been discussed  Admission:  3 [place] Orientation: Patient has been oriented to the room, unit and staff.  Family: none at bedside     Orders to be reviewed and implemented. Will continue to monitor the patient. Call light has been placed within reach and bed alarm has been activated.   Venetia Night, RN Phone: [phone]

## 2017-06-26 NOTE — Clinical Social Work Note (Signed)
Clinical Social Work Assessment  Patient Details  Name: Sabrina [name] MRN: [id number] Date of Birth: 1926-07-21  Date of referral:  06/26/17               Reason for consult:  Facility Placement, Discharge Planning                Permission sought to share information with:  Family Supports Permission granted to share information::  Yes, Verbal Permission Granted  Name::     Garald Balding  Agency::     Relationship::  Sister  Contact Information:  [phone]  Housing/Transportation Living arrangements for the past 2 months:  [place], [place] of Information:  Patient, Medical Team, Siblings Patient Interpreter Needed:  None Criminal Activity/Legal Involvement Pertinent to Current Situation/Hospitalization:  No - Comment as needed Significant Relationships:  Siblings, Spouse Lives with:  Spouse Do you feel safe going back to the place where you live?  Yes Need for family participation in patient care:  Yes (Comment)  Care giving concerns:  PT recommending HHPT vs. SNF once medically stable for discharged.   Social Worker assessment / plan:  CSW met with patient. No supports at bedside. CSW introduced role and explained that PT recommendations would be discussed. Patient confirmed she was just at [facility] from 4/3-4/23. She did not want to return to this facility and discussed concerns with care. She stated the staff were nice but sat in her wheelchair for extended periods of time some days or they may take anywhere from 2-15 minutes to answer her call bell. No indication of abuse. She is now in her copay days and does not have a secondary insurance to cover this. Patient would owe around $160 per day. CSW discussed with patient's sister that manages her finances. She stated the patient just cannot afford this as they are paying around $160 already for her husband to get 8 hours per day of care from [facility]. She stated it is not much more money  to get patient on the plan as well. Patient is also set up with [facility] for PT/OT/RN. Plan is for patient to return home at discharge. RNCM, MD, and RN aware. No further concerns. CSW signing off as social work intervention is no longer needed.  Employment status:  Retired Nurse, adult PT Recommendations:  Home with [place] / Referral to community resources:  [place]  Patient/Family's Response to care:  Patient will return home at discharge. Patient's family supportive and involved in patient's care. Patient and her sister appreciated social work intervention.  Patient/Family's Understanding of and Emotional Response to Diagnosis, Current Treatment, and Prognosis:  Patient and her sister have a good understanding of the reason for admission and plan to return home at discharge. Patient and her sister appear happy with hospital care.  Emotional Assessment Appearance:  Appears stated age Attitude/Demeanor/Rapport:  Engaged, Gracious Affect (typically observed):  Accepting, Appropriate, Calm, Pleasant Orientation:  Oriented to Self, Oriented to Place, Oriented to  Time, Oriented to Situation Alcohol / Substance use:  Never Used Psych involvement (Current and /or in the community):  No (Comment)  Discharge Needs  Concerns to be addressed:  Care Coordination Readmission within the last 30 days:  Yes Current discharge risk:  Dependent with Mobility Barriers to Discharge:  No Barriers Identified   Candie Chroman, LCSW 06/26/2017, 12:34 PM

## 2017-06-26 NOTE — Evaluation (Signed)
Physical Therapy Evaluation Patient Details Name: Sabrina [name] MRN: [id number] DOB: 07-23-1926 Today's Date: 06/26/2017   History of Present Illness  Pt is an 84 y.o. female admitted 06/24/17 with SOB and low O2 sats per [facility]; worked up for acute hypoxic respiratory failure. Lung perfusion scan shows low probablity of PE. PMH includes breast CA, DVT/PE (not on anticoagulation), CVA (05/2017), CKD, CHF, HTN. Of note, recently d/c to [facility] SNF from 4/3-4/23 for rehab post-CVA (bilateral occipital infarcts); had discharged home day of new hospital admission.     Clinical Impression  Pt presents with an overall decrease in functional mobility secondary to above. PTA, pt had recently d/c home 06/24/17 from [facility], immediately before this readmission to hospital. Was mod indep with ADLs and amb short distances with RW; had [place] services set up to start this week. Today, pt able to amb 69' with RW and min guard for balance. Feel pt could discharge home with continued HHPT/OT/RN/aide services if she were to have initial 24/7 support available for her and husband; pt unsure if this is a possibility. If not, pt would benefit from short-term SNF-level therapies to maximize functional mobility and independence prior to return home.   Follow Up Recommendations Home health PT;Supervision/Assistance - 24 hour(if not 24/7, recommend SNF)    Equipment Recommendations  None recommended by PT    Recommendations for Other Services       Precautions / Restrictions Precautions Precautions: Fall Precaution Comments: impaired vision Restrictions Weight Bearing Restrictions: No      Mobility  Bed Mobility Overal bed mobility: Needs Assistance Bed Mobility: Supine to Sit     Supine to sit: Supervision;HOB elevated        Transfers Overall transfer level: Needs assistance Equipment used: Rolling walker (2 wheeled) Transfers: Sit to/from Stand Sit to Stand: Min guard         General  transfer comment: Good ability to scoot towards EOB. Stood with RW and min guard for balance  Ambulation/Gait Ambulation/Gait assistance: Min guard Ambulation Distance (Feet): 80 Feet Assistive device: Rolling walker (2 wheeled) Gait Pattern/deviations: Step-through pattern;Decreased stride length;Antalgic;Trunk flexed Gait velocity: Decreased Gait velocity interpretation: <1.31 ft/sec, indicative of household ambulator General Gait Details: Slow, slightly unsteady amb with RW and min guard for balance. C/o knee pain and fatigue limiting further ambulation distance. Trunk/neck flexed requiring cues for upright posture  Stairs            Wheelchair Mobility    Modified Rankin (Stroke Patients Only)       Balance Overall balance assessment: Needs assistance Sitting-balance support: Feet supported;No upper extremity supported Sitting balance-Leahy Scale: Fair     Standing balance support: During functional activity;Bilateral upper extremity supported Standing balance-Leahy Scale: Poor Standing balance comment: Requires BUE support in standing.                              Pertinent Vitals/Pain Pain Assessment: Faces Faces Pain Scale: Hurts little more Pain Location: L knee Pain Descriptors / Indicators: Discomfort;Sore Pain Intervention(s): Monitored during session;Limited activity within patient's tolerance    Home Living Family/patient expects to be discharged to:: Private residence Living Arrangements: Spouse/significant other Available Help at Discharge: Family Type of Home: House Home Access: Stairs to enter Entrance Stairs-Rails: None Entrance Stairs-Number of Steps: 2-back Home Layout: One level Home Equipment: Cane - single point;Walker - 2 wheels Additional Comments: Pt lives with spouse who is there 24/7, but cannot  provide physical assist (he has [place] assist with medications)    Prior Function Level of Independence: Independent with assistive  device(s)   Gait / Transfers Assistance Needed: Pt d/c home from SNF on 4/23 (day of new hospital admission). Was mod indep amb short distances with RW  ADL's / Homemaking Assistance Needed: Reports mod indep with ADLs  Comments: HHRN assists husband medications; spouse has dementia     Hand Dominance        Extremity/Trunk Assessment   Upper Extremity Assessment Upper Extremity Assessment: Generalized weakness    Lower Extremity Assessment Lower Extremity Assessment: Generalized weakness    Cervical / Trunk Assessment Cervical / Trunk Assessment: Kyphotic  Communication   Communication: No difficulties  Cognition Arousal/Alertness: Awake/alert Behavior During Therapy: WFL for tasks assessed/performed Overall Cognitive Status: Within Functional Limits for tasks assessed                                 General Comments: At times tangential with speech requiring intermittent cues to stay on topic/attend to task      General Comments General comments (skin integrity, edema, etc.): SpO2 94% on RA    Exercises     Assessment/Plan    PT Assessment Patient needs continued PT services  PT Problem List Decreased strength;Decreased balance;Decreased mobility;Decreased activity tolerance       PT Treatment Interventions DME instruction;Functional mobility training;Therapeutic activities;Gait training;Balance training;Patient/family education;Therapeutic exercise;Stair training    PT Goals (Current goals can be found in the Care Plan section)  Acute Rehab PT Goals Patient Stated Goal: Return home with increased assistance PT Goal Formulation: With patient Time For Goal Achievement: 07/10/17 Potential to Achieve Goals: Good    Frequency Min 3X/week   Barriers to discharge Decreased caregiver support Spouse has dementia    Co-evaluation               AM-PAC PT "6 Clicks" Daily Activity  Outcome Measure Difficulty turning over in bed (including  adjusting bedclothes, sheets and blankets)?: None Difficulty moving from lying on back to sitting on the side of the bed? : None Difficulty sitting down on and standing up from a chair with arms (e.g., wheelchair, bedside commode, etc,.)?: A Little Help needed moving to and from a bed to chair (including a wheelchair)?: A Little Help needed walking in hospital room?: A Little Help needed climbing 3-5 steps with a railing? : A Lot 6 Click Score: 19    End of Session Equipment Utilized During Treatment: Gait belt Activity Tolerance: Patient tolerated treatment well;Patient limited by pain Patient left: in chair;with call bell/phone within reach;with chair alarm set Nurse Communication: Mobility status PT Visit Diagnosis: Muscle weakness (generalized) (M62.81);Difficulty in walking, not elsewhere classified (R26.2)    Time: 2620-3559 PT Time Calculation (min) (ACUTE ONLY): 32 min   Charges:   PT Evaluation $PT Eval Moderate Complexity: 1 Mod PT Treatments $Therapeutic Activity: 8-22 mins   PT G Codes:       Mabeline Caras, PT, DPT Acute Rehab Services  Pager: [place] 06/26/2017, 10:31 AM

## 2017-06-26 NOTE — Progress Notes (Signed)
Pt ride at bedside  Pt states she wants to appeal her discharge  Case manager aware  Paged MD to inform

## 2017-06-26 NOTE — Discharge Summary (Signed)
Physician Discharge Summary  Sabrina [name] WNU:[id number] DOB: [birth year] DOA: 06/24/2017  PCP: [name], MD  Admit date: 06/24/2017 Discharge date: 06/26/2017  Admitted From: Home Disposition: Home   Recommendations for Outpatient Follow-up:  1. Follow up with PCP in 1-2 weeks  Home Health: PT, OT, RN, aide, CSW; also going to arrange caretaker assistance 8 hrs per day. Pt unable to return to SNF level of care due to being in co-pay days and unable to afford that. Equipment/Devices: None new Discharge Condition: Stable CODE STATUS: Full Diet recommendation: Heart healthy  Brief/Interim Summary: Per HPI:  "Sabrina [name] is a 84 y.o. female with medical history significant for breast cancer status post resection, history of DVT and PE no longer on anticoagulation, history of CVA last month, chronic diastolic CHF, and hypertension, now presenting to the emergency department with shortness of breath and low O2 sats.  Patient recently returned home after rehabilitating after her stroke, has had some nausea with nonbloody vomiting, and then developed acute onset of shortness of breath with low oxygen saturations per the report of her home health RN.  Patient denies chest pain, fevers, or chills.  ED Course: Upon arrival to the ED, patient is found to be afebrile, saturating 85% on room air, mildly tachypneic, mildly tachycardic, and with stable blood pressure.  EKG features a sinus tachycardia with rate 101 and chest x-ray is notable for chronic scarring in the right upper lung, but no acute findings.  Chemistry panel is notable for a creatinine of 1.83, consistent with her apparent baseline.  CBC features a new leukocytosis to 11,500 and stable normocytic anemia with hemoglobin of 11.0.  Troponin is elevated to 0.04, then to 0.09 few hours later.  Patient was treated with albuterol and Zofran in the ED.  She was started on supplemental oxygen.  She remains hemodynamically stable and will be  observed on the telemetry unit for ongoing evaluation and management of acute hypoxic respiratory failure."  Hypoxia resolved after antibiotics started. V/Q scan was low risk. Vanc/zosyn was transitioned to augmentin for presumed aspiration pneumonia in setting of vomiting. The patient has struggled with constipation, so this was treated and had a bowel movement. She's tolerated breakfast this morning without nausea, no further vomiting since admission.   Discharge Diagnoses:  Principal Problem:   Acute respiratory failure with hypoxia (HCC) Active Problems:   Breast cancer of upper-outer quadrant of right female breast ([place])   History of pulmonary embolism   Accelerated hypertension   Chronic diastolic congestive heart failure (HCC)   CKD (chronic kidney disease), stage IV (HCC)   DM (diabetes mellitus) (HCC)   Elevated troponin  Acute hypoxic respiratory failure: No infiltrate on CXR, and low risk V/Q scan. Presume aspiration pneumonitis and pneumonia based on clinical history. Remains afebrile, leukocytosis improving. Strep pneumo and MRSA negative.  - Continue augmentin x7 days - Monitor blood cultures   History of CVA: Recently discharged from SNF - Continue home health as ordered  Nausea and vomiting: Intermittent complaints which are now resolved.  - Recommend GI follow up, consider gastric emptying scan  - Continue scheduled bowel regimen  Elevated troponin: [place], Troponin elevated to 0.04, then 0.09 without chest pain. These are actually below her historic levels (0.14 - 0.15)  CKD stage IV: SCr is 1.83 on admission, consistent with her apparent baseline  - Renally-dose medications, avoid nephrotoxins    Chronic diastolic CHF: Appears well-compensated. - Continue home medications.   Type II DM: A1c was  7.4% last month   - Return to home medications at discharge.  Breast cancer: status-post definitive surgery  - Follows with oncology and managed with anastrozole,  now 3 years in to planned 5 yrs of tx    Hx of DVT and PE  - Hx of bilateral DVT and PE in April 2015, treated with coumadin, now off of AC. V/Q scan is low risk.   Discharge Instructions Discharge Instructions    Diet - low sodium heart healthy   Complete by:  As directed    Discharge instructions   Complete by:  As directed    You were admitted for cough, shortness of breath that was due to aspiration pneumonitis and aspiration pneumonia. With antibiotics you have improved and no longer need oxygen. You will continue taking antibiotics as below. You are medically stable for discharge. Insurance will no longer pay for you to be at a nursing facility and physical therapy has recommended that you have maximized home healthcare at home at discharge.  - Continue taking augmentin twice daily (sent to your pharmacy) to finish 6 more days of antibiotics.  - Continue taking colace and miralax. It's recommended that you use both twice daily every day unless you are experiencing diarrhea. This is to prevent constipation which may be causing the intermittent vomiting. Constipation is much easier to prevent than to treat.  - Your sister has been contacted by our Education officer, museum and is arranging a caretaker for assistance at home.  - If your symptoms return, seek medical attention right away. Otherwise follow up with your PCP in the next 1 -2  weeks.   Increase activity slowly   Complete by:  As directed      Allergies as of 06/26/2017   No Known Allergies     Medication List    TAKE these medications   allopurinol 100 MG tablet Commonly known as:  ZYLOPRIM Take 100 mg by mouth daily.   amoxicillin-clavulanate 875-125 MG tablet Commonly known as:  AUGMENTIN Take 1 tablet by mouth every 12 (twelve) hours.   anastrozole 1 MG tablet Commonly known as:  ARIMIDEX Take 1 tablet (1 mg total) by mouth daily.   aspirin 325 MG EC tablet Take 1 tablet (325 mg total) by mouth daily.   atorvastatin  40 MG tablet Commonly known as:  LIPITOR Take 1 tablet (40 mg total) by mouth every evening.   BIOTIN 5000 PO Take 1 tablet by mouth daily.   cholecalciferol 1000 units tablet Commonly known as:  VITAMIN D Take 1,000 Units by mouth daily.   cloNIDine 0.2 MG tablet Commonly known as:  CATAPRES Take 0.2 mg by mouth 2 (two) times daily.   colchicine 0.6 MG tablet Take 0.6 mg by mouth as needed (GOUT).   docusate sodium 100 MG capsule Commonly known as:  COLACE Take 100 mg by mouth 2 (two) times daily as needed for mild constipation. Reported on 03/21/2015   ferrous sulfate 325 (65 FE) MG tablet Take 325 mg by mouth daily with breakfast.   furosemide 40 MG tablet Commonly known as:  LASIX TAKE 40 mg  TABLET EVERY OTHER DAY   gabapentin 100 MG capsule Commonly known as:  NEURONTIN Take 200 mg by mouth 3 (three) times daily.   glipiZIDE 2.5 mg Tabs tablet Commonly known as:  GLUCOTROL Take 0.5 tablets (2.5 mg total) by mouth 2 (two) times daily before a meal. What changed:    when to take this  additional instructions  polyethylene glycol packet Commonly known as:  MIRALAX / GLYCOLAX Take 17 g by mouth daily as needed for mild constipation.   traMADol 50 MG tablet Commonly known as:  ULTRAM Take 50 mg by mouth every 6 (six) hours as needed.   verapamil 240 MG 24 hr capsule Commonly known as:  VERELAN PM Take 240 mg by mouth daily.   vitamin C 500 MG tablet Commonly known as:  ASCORBIC ACID Take 500 mg by mouth daily.      Follow-up Information    [name], MD Follow up.   Specialty:  Internal Medicine         No Known Allergies  Consultations:  None  Procedures/Studies: Dg Chest 2 View  Result Date: 06/24/2017 CLINICAL DATA:  Shortness of breath for 1 day. History of renal disease, hypertension, diabetes. Nonsmoker. EXAM: CHEST - 2 VIEW COMPARISON:  05/30/2017 FINDINGS: Somewhat shallow inspiration. Heart size and pulmonary vascularity are  normal. Linear scarring in the right upper lung. No airspace disease or consolidation. No blunting of costophrenic angles. No pneumothorax. Mediastinal contours appear intact. Degenerative changes in the spine and shoulders. Surgical clips in the right axilla. IMPRESSION: Chronic scarring in the right upper lung. No evidence of active pulmonary disease. Electronically Signed   By: Lucienne Capers M.D.   On: 06/24/2017 21:12   Dg Ribs Unilateral W/chest Right  Result Date: 05/30/2017 CLINICAL DATA:  Fall landing on walker.  Right rib pain. EXAM: RIGHT RIBS AND CHEST - 3+ VIEW COMPARISON:  Chest x-ray from 3 days ago FINDINGS: Artifact from EKG leads. Cholelithiasis. There is no edema, consolidation, effusion, or pneumothorax. Hazy peripheral opacity at the right apex that is chronic based on 2017 chest CT and likely from radiation fibrosis. Normal heart size. Stable mediastinal contours. Mitral annular calcification. Postoperative right axilla and breast. No visible rib fracture. IMPRESSION: 1. No visible rib fracture or intrathoracic injury. 2. Cholelithiasis. Electronically Signed   By: Monte Fantasia M.D.   On: 05/30/2017 12:41   Dg Abd 1 View  Result Date: 06/25/2017 CLINICAL DATA:  Nausea, vomiting.  Abdominal distension. EXAM: ABDOMEN - 1 VIEW COMPARISON:  Radiographs of June 10, 2013. FINDINGS: No abnormal bowel dilatation is noted. Large amount of stool seen in the right colon and rectum. This may represent rectal impaction. No abnormal bowel dilatation is noted. Phleboliths are noted in the pelvis. IMPRESSION: No abnormal bowel dilatation is noted. Large amount of stool seen in the right colon and rectum, and rectal impaction cannot be excluded. Electronically Signed   By: Marijo Conception, M.D.   On: 06/25/2017 09:29   Ct Head Wo Contrast  Result Date: 05/30/2017 CLINICAL DATA:  Ground level fall last night. EXAM: CT HEAD WITHOUT CONTRAST CT CERVICAL SPINE WITHOUT CONTRAST TECHNIQUE:  Multidetector CT imaging of the head and cervical spine was performed following the standard protocol without intravenous contrast. Multiplanar CT image reconstructions of the cervical spine were also generated. COMPARISON:  CT head dated May 27, 2017. FINDINGS: CT HEAD FINDINGS Brain: No evidence of acute infarction, hemorrhage, hydrocephalus, extra-axial collection or mass lesion/mass effect. Apparent hypodensity within the right greater than left occipital lobes. Stable mild atrophy and chronic microvascular ischemic changes. Vascular: Calcified atherosclerosis at the skullbase. No hyperdense vessel. Skull: Normal. Negative for fracture or focal lesion. Sinuses/Orbits: No acute finding. Other: None. CT CERVICAL SPINE FINDINGS Alignment: Trace stepwise anterolisthesis at C3-C4, C4-C5, and C5-C6 due to facet arthropathy. No traumatic malalignment. Skull base and vertebrae: No acute fracture.  No primary bone lesion or focal pathologic process. Soft tissues and spinal canal: No prevertebral fluid or swelling. No visible canal hematoma. Disc levels: Mild to moderate disc height loss and uncovertebral hypertrophy from C5-C6 through C7-T1. Moderate to severe bilateral facet arthropathy throughout the cervical spine. Upper chest: Scarring/fibrosis in the right lung apex, possibly from prior radiation. Other: None. IMPRESSION: 1. No definite acute intracranial abnormality. Apparent hypodensity within the right greater than left occipital lobes is favored artifactual, less likely due to infarct or posterior reversible encephalopathy syndrome. Correlate for visual symptoms or hypertensive urgency. 2. No acute cervical spine fracture. Mild-to-moderate degenerative disc disease in the lower cervical spine. Moderate to severe facet arthropathy throughout the cervical spine. Electronically Signed   By: Titus Dubin M.D.   On: 05/30/2017 12:35   Ct Cervical Spine Wo Contrast  Result Date: 05/30/2017 CLINICAL DATA:   Ground level fall last night. EXAM: CT HEAD WITHOUT CONTRAST CT CERVICAL SPINE WITHOUT CONTRAST TECHNIQUE: Multidetector CT imaging of the head and cervical spine was performed following the standard protocol without intravenous contrast. Multiplanar CT image reconstructions of the cervical spine were also generated. COMPARISON:  CT head dated May 27, 2017. FINDINGS: CT HEAD FINDINGS Brain: No evidence of acute infarction, hemorrhage, hydrocephalus, extra-axial collection or mass lesion/mass effect. Apparent hypodensity within the right greater than left occipital lobes. Stable mild atrophy and chronic microvascular ischemic changes. Vascular: Calcified atherosclerosis at the skullbase. No hyperdense vessel. Skull: Normal. Negative for fracture or focal lesion. Sinuses/Orbits: No acute finding. Other: None. CT CERVICAL SPINE FINDINGS Alignment: Trace stepwise anterolisthesis at C3-C4, C4-C5, and C5-C6 due to facet arthropathy. No traumatic malalignment. Skull base and vertebrae: No acute fracture. No primary bone lesion or focal pathologic process. Soft tissues and spinal canal: No prevertebral fluid or swelling. No visible canal hematoma. Disc levels: Mild to moderate disc height loss and uncovertebral hypertrophy from C5-C6 through C7-T1. Moderate to severe bilateral facet arthropathy throughout the cervical spine. Upper chest: Scarring/fibrosis in the right lung apex, possibly from prior radiation. Other: None. IMPRESSION: 1. No definite acute intracranial abnormality. Apparent hypodensity within the right greater than left occipital lobes is favored artifactual, less likely due to infarct or posterior reversible encephalopathy syndrome. Correlate for visual symptoms or hypertensive urgency. 2. No acute cervical spine fracture. Mild-to-moderate degenerative disc disease in the lower cervical spine. Moderate to severe facet arthropathy throughout the cervical spine. Electronically Signed   By: Titus Dubin  M.D.   On: 05/30/2017 12:35   Mr Brain Wo Contrast  Result Date: 05/30/2017 CLINICAL DATA:  Blurry vision for a few days. Multiple recent falls. EXAM: MRI HEAD WITHOUT CONTRAST TECHNIQUE: Multiplanar, multiecho pulse sequences of the brain and surrounding structures were obtained without intravenous contrast. COMPARISON:  Head CT 05/30/2017 and MRI 05/27/2017 FINDINGS: Brain: There are patchy acute infarcts in both occipital lobes which are new from the recent prior MRI and correlate with the hypodensities on today's earlier CT. No intracranial hemorrhage, mass, midline shift, or extra-axial fluid collection is identified. Elsewhere, T2 hyperintensities in the cerebral white matter bilaterally are unchanged from the prior MRI and are nonspecific but compatible with mild chronic small vessel ischemic disease. Generalized cerebral atrophy is not greater than expected for age. Vascular: Major intracranial vascular flow voids are preserved. Skull and upper cervical spine: Unremarkable bone marrow signal. Sinuses/Orbits: Unremarkable orbits. Paranasal sinuses and mastoid air cells are clear. Other: None. IMPRESSION: 1. Acute bilateral occipital lobe infarcts. 2. Mild chronic small vessel ischemic disease. Electronically  Signed   By: Logan Bores M.D.   On: 05/30/2017 16:02   Nm Pulmonary Perf And Vent  Result Date: 06/25/2017 CLINICAL DATA:  Generalized weakness and difficulty breathing EXAM: NUCLEAR MEDICINE VENTILATION - PERFUSION LUNG SCAN VIEWS: Anterior, posterior, left lateral, right lateral, RPO, LPO, RAO, LAO-ventilation and perfusion RADIOPHARMACEUTICALS:  30.0 mCi of Tc-36m DTPA aerosol inhalation and 5.4 mCi Tc42m-MAA IV COMPARISON:  Chest radiograph June 24, 2017 FINDINGS: Ventilation: There are scattered subsegmental ventilation defects bilaterally. There is a ventilation defect in the posterior segment of the left upper lobe which occupies a significant portion of this segment. Perfusion: There are  scattered subsegmental perfusion defects which match ventilation defects. There is a perfusion defect in the posterior segment of the left upper lobe which matches the ventilation defect. No appreciable ventilation/perfusion mismatch is evident on this study. IMPRESSION: Matching subsegmental defects as well as a matching defect occupying much of the segment of the posterior segment left upper lobe. No appreciable ventilation/perfusion mismatch on either side. By PIOPED II criteria, this study constitutes an overall low probability of pulmonary embolus. Electronically Signed   By: Lowella Grip III M.D.   On: 06/25/2017 12:36   Subjective: No dyspnea, walked with PT down the hall with minimum assist. SpO2 checked and found to be >90% on room air. No chest pain. Feels diffusely weak. Her sister can arrange home health caretakers to assist daily in addition to home health being arranged here.   Discharge Exam: Vitals:   06/26/17 0516 06/26/17 0833  BP: 117/63 102/67  Pulse: 76 60  Resp: 18   Temp: (!) 97.4 F (36.3 C)   SpO2: 99%    General: Pt is alert, awake, not in acute distress Cardiovascular: RRR, S1/S2 +, no rubs, no gallops Respiratory: CTA bilaterally, no wheezing, no rhonchi Abdominal: Soft, NT, ND, bowel sounds + Extremities: Trace edema, no cyanosis  Labs: BNP (last 3 results) No results for input(s): BNP in the last 8760 hours. Basic Metabolic Panel: Recent Labs  Lab 06/24/17 2145 06/25/17 0913  NA 136 138  K 3.9 4.2  CL 101 104  CO2 21* 24  GLUCOSE 201* 140*  BUN 45* 45*  CREATININE 1.83* 1.86*  CALCIUM 8.7* 8.7*   Liver Function Tests: Recent Labs  Lab 06/24/17 2145  AST 30  ALT 29  ALKPHOS 98  BILITOT 0.8  PROT 6.5  ALBUMIN 3.4*   Recent Labs  Lab 06/24/17 2145  LIPASE 67*   No results for input(s): AMMONIA in the last 168 hours. CBC: Recent Labs  Lab 06/24/17 2145 06/25/17 0350  WBC 14.5* 10.7*  NEUTROABS 11.5* 8.3*  HGB 11.0* 10.2*   HCT 34.7* 31.6*  MCV 99.1 97.5  PLT 241 254   Cardiac Enzymes: Recent Labs  Lab 06/24/17 2145 06/25/17 0042  TROPONINI 0.04* 0.09*   BNP: Invalid input(s): POCBNP CBG: Recent Labs  Lab 06/25/17 1227 06/25/17 1757 06/25/17 2106 06/26/17 0735 06/26/17 1133  GLUCAP 113* 160* 165* 170* 140*   D-Dimer No results for input(s): DDIMER in the last 72 hours. Hgb A1c No results for input(s): HGBA1C in the last 72 hours. Lipid Profile No results for input(s): CHOL, HDL, LDLCALC, TRIG, CHOLHDL, LDLDIRECT in the last 72 hours. Thyroid function studies No results for input(s): TSH, T4TOTAL, T3FREE, THYROIDAB in the last 72 hours.  Invalid input(s): FREET3 Anemia work up No results for input(s): VITAMINB12, FOLATE, FERRITIN, TIBC, IRON, RETICCTPCT in the last 72 hours. Urinalysis    Component Value Date/Time  COLORURINE YELLOW 05/31/2017 [place] 05/31/2017 0905   LABSPEC 1.012 05/31/2017 0905   PHURINE 6.0 05/31/2017 0905   GLUCOSEU NEGATIVE 05/31/2017 0905   HGBUR SMALL (A) 05/31/2017 0905   BILIRUBINUR NEGATIVE 05/31/2017 0905   KETONESUR NEGATIVE 05/31/2017 0905   PROTEINUR NEGATIVE 05/31/2017 0905   UROBILINOGEN 0.2 09/13/2013 0523   NITRITE NEGATIVE 05/31/2017 0905   LEUKOCYTESUR LARGE (A) 05/31/2017 0905    Microbiology Recent Results (from the past 240 hour(s))  MRSA PCR Screening     Status: None   Collection Time: 06/25/17  8:48 PM  Result Value Ref Range Status   MRSA by PCR NEGATIVE NEGATIVE Final    Comment:        The GeneXpert MRSA Assay (FDA approved for NASAL specimens only), is one component of a comprehensive MRSA colonization surveillance program. It is not intended to diagnose MRSA infection nor to guide or monitor treatment for MRSA infections. Performed at [facility], [place] [address], [place], [place] [address]     Time coordinating discharge: Approximately 40 minutes  Vance Gather, MD  [facility] 06/26/2017, 12:53 PM Pager [phone]

## 2017-06-26 NOTE — Progress Notes (Signed)
Social worker aware pt does not want to return to [facility]

## 2017-06-26 NOTE — Progress Notes (Signed)
Patient slept during the night with no complains or concerns. PRN medications for cough and nausea given and effective. VS stable, patient denies pain. Per CCMD had 7 beats of wide QRS's, patient asymptomatic, asleep.  Will continue to monitor.  Sarissa Dern, RN

## 2017-06-26 NOTE — Progress Notes (Signed)
MD stated pt will be staying another night  Pt and pt health aid aware

## 2017-06-26 NOTE — Progress Notes (Signed)
Pt stated she called her discharge transportation  Awaiting ride at bedside to go over pt discharge education

## 2017-06-26 NOTE — Progress Notes (Signed)
PROGRESS NOTE  Sabrina [name]  AQT:[id number] DOB: 03/28/1926 DOA: 06/24/2017 PCP: [name], MD   Brief Narrative: Per HPI: "Sabrina [name] a 84 y.o.femalewith medical history significant forbreast cancer status post resection, history of DVT and PE no longer on anticoagulation, history of CVA last month, chronic diastolic CHF, and hypertension, now presenting to the emergency department with shortness of breath and low O2 sats. Patient recently returned home after rehabilitating after her stroke, has had some nausea with nonbloody vomiting, and then developed acute onset of shortness of breath with low oxygen saturations per the report of her home health RN. Patient denies chest pain, fevers, or chills.  ED Course:Upon arrival to the ED, patient is found to be afebrile, saturating 85% on room air, mildly tachypneic, mildly tachycardic, and with stable blood pressure. EKG features a sinus tachycardia with rate 101 and chest x-ray is notable for chronic scarring in the right upper lung, but no acute findings. Chemistry panel is notable for a creatinine of 1.83, consistent with her apparent baseline. CBC features a new leukocytosis to 11,500 and stable normocytic anemia with hemoglobin of 11.0. Troponin is elevated to 0.04, then to 0.09 few hours later. Patient was treated with albuterol and Zofran in the ED. She was started on supplemental oxygen. She remains hemodynamically stable and will be observed on the telemetry unit for ongoing evaluation and management of acute hypoxic respiratory failure."  Hypoxia has resolved after antibiotics started. V/Q scan was low risk. Vanc/zosyn was transitioned to augmentin for presumed aspiration pneumonia in setting of vomiting. The patient has struggled with constipation, so this was treated and had a bowel movement. though she initially had tolerated breakfast and was scheduled for discharge, she has been unable to take food or water due to  intractable nausea. I'm concerned that her symptoms have not resolved with bowel movement and would not be able to sustain herself at home in her debilitated state. Will hold for another night.   Assessment & Plan: Principal Problem:   Acute respiratory failure with hypoxia (HCC) Active Problems:   Breast cancer of upper-outer quadrant of right female breast ([place])   History of pulmonary embolism   Accelerated hypertension   Chronic diastolic congestive heart failure (HCC)   CKD (chronic kidney disease), stage IV (HCC)   DM (diabetes mellitus) (HCC)   Elevated troponin  Nausea and vomiting: Intermittent complaints which are ongoing.  - Continue antiemetics prn and try to push po.  - Continue scheduled senna and miralax. Give dulcolax x1 today given stool burden on imaging.  - Recommend GI follow up, consider gastric emptying scan or empiric reglan given her Hx DM.  Acute hypoxic respiratory failure: No infiltrate on CXR, and low risk V/Q scan. Presume aspiration pneumonitis and pneumonia based on clinical history. Remains afebrile, leukocytosis improving. Strep pneumo and MRSA negative.  - Continue augmentin x7 days - Monitor blood cultures  History of CVA: Recently discharged from SNF - Continue home health as ordered. Pt unable to afford copay for SNF.  Elevated troponin: [place], Troponin elevated to 0.04, then 0.09 without chest pain. These are actually below her historic levels (0.14 - 0.15)  CKD stage IV: SCr is 1.83 on admission, consistent with her apparent baseline -Renally-dose medications, avoid nephrotoxins - Hold lasix for another day  Chronic diastolic CHF: Appears well-compensated. -Continue home medications.  Type II DM: A1c was 7.4% last month -Return to home medications at discharge.  Breast cancer: status-post definitive surgery -Follows with oncology and  managed with anastrozole, now 3 years in to planned 5 yrs of tx  Hx of DVT and PE    - Hx of bilateral DVT and PE in April 2015, treated with coumadin, now off of AC. V/Q scan is low risk.   DVT prophylaxis: hydralazine Code Status: Full Family Communication: None at bedside Disposition Plan: Home when improved.  Consultants:   None  Procedures:   None  Antimicrobials:  Vanc/zosyn 4/24-4/25  Augmentin 4/25 >>    Subjective: Initially was doing ok, ready for DC. However, since visit this AM she's been intractably nauseated and has taken nothing po. No further BM. Denies chest pain just very weak diffusely.  Objective: Vitals:   06/25/17 2334 06/26/17 0516 06/26/17 0833 06/26/17 1320  BP: 138/66 117/63 102/67 (!) 151/60  Pulse: 78 76 60 86  Resp: 18 18  20   Temp: 98 F (36.7 C) (!) 97.4 F (36.3 C)  98.9 F (37.2 C)  TempSrc: Oral Oral  Oral  SpO2: 96% 99%  93%  Weight:  75 kg (165 lb 4 oz)    Height:        Intake/Output Summary (Last 24 hours) at 06/26/2017 1605 Last data filed at 06/26/2017 1451 Gross per 24 hour  Intake 1160 ml  Output 1200 ml  Net -40 ml   Filed Weights   06/25/17 2031 06/26/17 0516  Weight: 75.8 kg (167 lb 3.2 oz) 75 kg (165 lb 4 oz)    Gen: Tired-appearing elderly female in no distress  Pulm: Non-labored breathing room air. Clear to auscultation bilaterally.  CV: Regular rate and rhythm. No murmur, rub, or gallop. No JVD, trace pedal edema. GI: Abdomen soft, non-tender, non-distended, with normoactive bowel sounds.   Ext: Warm, no deformities Skin: No rashes, lesions or ulcers Neuro: Alert and oriented. No focal neurological deficits. Psych: Judgement and insight appear normal. Mood & affect appropriate.   Data Reviewed: I have personally reviewed following labs and imaging studies  CBC: Recent Labs  Lab 06/24/17 2145 06/25/17 0350  WBC 14.5* 10.7*  NEUTROABS 11.5* 8.3*  HGB 11.0* 10.2*  HCT 34.7* 31.6*  MCV 99.1 97.5  PLT 241 675   Basic Metabolic Panel: Recent Labs  Lab 06/24/17 2145 06/25/17 0913   NA 136 138  K 3.9 4.2  CL 101 104  CO2 21* 24  GLUCOSE 201* 140*  BUN 45* 45*  CREATININE 1.83* 1.86*  CALCIUM 8.7* 8.7*   GFR: Estimated Creatinine Clearance: 18.4 mL/min (A) (by C-G formula based on SCr of 1.86 mg/dL (H)). Liver Function Tests: Recent Labs  Lab 06/24/17 2145  AST 30  ALT 29  ALKPHOS 98  BILITOT 0.8  PROT 6.5  ALBUMIN 3.4*   Recent Labs  Lab 06/24/17 2145  LIPASE 67*   No results for input(s): AMMONIA in the last 168 hours. Coagulation Profile: No results for input(s): INR, PROTIME in the last 168 hours. Cardiac Enzymes: Recent Labs  Lab 06/24/17 2145 06/25/17 0042  TROPONINI 0.04* 0.09*   BNP (last 3 results) No results for input(s): PROBNP in the last 8760 hours. HbA1C: No results for input(s): HGBA1C in the last 72 hours. CBG: Recent Labs  Lab 06/25/17 1227 06/25/17 1757 06/25/17 2106 06/26/17 0735 06/26/17 1133  GLUCAP 113* 160* 165* 170* 140*   Lipid Profile: No results for input(s): CHOL, HDL, LDLCALC, TRIG, CHOLHDL, LDLDIRECT in the last 72 hours. Thyroid Function Tests: No results for input(s): TSH, T4TOTAL, FREET4, T3FREE, THYROIDAB in the last 72 hours. Anemia  Panel: No results for input(s): VITAMINB12, FOLATE, FERRITIN, TIBC, IRON, RETICCTPCT in the last 72 hours. Urine analysis:    Component Value Date/Time   COLORURINE YELLOW 05/31/2017 0905   APPEARANCEUR CLEAR 05/31/2017 0905   LABSPEC 1.012 05/31/2017 0905   PHURINE 6.0 05/31/2017 0905   GLUCOSEU NEGATIVE 05/31/2017 0905   HGBUR SMALL (A) 05/31/2017 0905   BILIRUBINUR NEGATIVE 05/31/2017 0905   KETONESUR NEGATIVE 05/31/2017 0905   PROTEINUR NEGATIVE 05/31/2017 0905   UROBILINOGEN 0.2 09/13/2013 0523   NITRITE NEGATIVE 05/31/2017 0905   LEUKOCYTESUR LARGE (A) 05/31/2017 0905   Recent Results (from the past 240 hour(s))  Culture, blood (routine x 2) Call MD if unable to obtain prior to antibiotics being given     Status: None (Preliminary result)   Collection  Time: 06/25/17  3:40 AM  Result Value Ref Range Status   Specimen Description BLOOD LEFT ARM  Final   Special Requests   Final    BOTTLES DRAWN AEROBIC AND ANAEROBIC Blood Culture results may not be optimal due to an excessive volume of blood received in culture bottles   Culture   Final    NO GROWTH 1 DAY Performed at [facility], [place] [address] [place], [place] [address]    Report Status PENDING  Incomplete  Culture, blood (routine x 2) Call MD if unable to obtain prior to antibiotics being given     Status: None (Preliminary result)   Collection Time: 06/25/17  4:00 AM  Result Value Ref Range Status   Specimen Description BLOOD LEFT HAND  Final   Special Requests   Final    AEROBIC BOTTLE ONLY Blood Culture results may not be optimal due to an excessive volume of blood received in culture bottles   Culture   Final    NO GROWTH 1 DAY Performed at [facility] [address] [place], [place] [address]    Report Status PENDING  Incomplete  MRSA PCR Screening     Status: None   Collection Time: 06/25/17  8:48 PM  Result Value Ref Range Status   MRSA by PCR NEGATIVE NEGATIVE Final    Comment:        The GeneXpert MRSA Assay (FDA approved for NASAL specimens only), is one component of a comprehensive MRSA colonization surveillance program. It is not intended to diagnose MRSA infection nor to guide or monitor treatment for MRSA infections. Performed at [facility], [place] [address] [place], [place] [address]       Radiology Studies: Dg Chest 2 View  Result Date: 06/24/2017 CLINICAL DATA:  Shortness of breath for 1 day. History of renal disease, hypertension, diabetes. Nonsmoker. EXAM: CHEST - 2 VIEW COMPARISON:  05/30/2017 FINDINGS: Somewhat shallow inspiration. Heart size and pulmonary vascularity are normal. Linear scarring in the right upper lung. No airspace disease or consolidation. No blunting of costophrenic angles. No pneumothorax. Mediastinal  contours appear intact. Degenerative changes in the spine and shoulders. Surgical clips in the right axilla. IMPRESSION: Chronic scarring in the right upper lung. No evidence of active pulmonary disease. Electronically Signed   By: Lucienne Capers M.D.   On: 06/24/2017 21:12   Dg Abd 1 View  Result Date: 06/25/2017 CLINICAL DATA:  Nausea, vomiting.  Abdominal distension. EXAM: ABDOMEN - 1 VIEW COMPARISON:  Radiographs of June 10, 2013. FINDINGS: No abnormal bowel dilatation is noted. Large amount of stool seen in the right colon and rectum. This may represent rectal impaction. No abnormal  bowel dilatation is noted. Phleboliths are noted in the pelvis. IMPRESSION: No abnormal bowel dilatation is noted. Large amount of stool seen in the right colon and rectum, and rectal impaction cannot be excluded. Electronically Signed   By: Marijo Conception, M.D.   On: 06/25/2017 09:29   Nm Pulmonary Perf And Vent  Result Date: 06/25/2017 CLINICAL DATA:  Generalized weakness and difficulty breathing EXAM: NUCLEAR MEDICINE VENTILATION - PERFUSION LUNG SCAN VIEWS: Anterior, posterior, left lateral, right lateral, RPO, LPO, RAO, LAO-ventilation and perfusion RADIOPHARMACEUTICALS:  30.0 mCi of Tc-14m DTPA aerosol inhalation and 5.4 mCi Tc6m-MAA IV COMPARISON:  Chest radiograph June 24, 2017 FINDINGS: Ventilation: There are scattered subsegmental ventilation defects bilaterally. There is a ventilation defect in the posterior segment of the left upper lobe which occupies a significant portion of this segment. Perfusion: There are scattered subsegmental perfusion defects which match ventilation defects. There is a perfusion defect in the posterior segment of the left upper lobe which matches the ventilation defect. No appreciable ventilation/perfusion mismatch is evident on this study. IMPRESSION: Matching subsegmental defects as well as a matching defect occupying much of the segment of the posterior segment left upper lobe.  No appreciable ventilation/perfusion mismatch on either side. By PIOPED II criteria, this study constitutes an overall low probability of pulmonary embolus. Electronically Signed   By: Lowella Grip III M.D.   On: 06/25/2017 12:36    Scheduled Meds: . allopurinol  100 mg Oral Daily  . amoxicillin-clavulanate  1 tablet Oral Q12H  . anastrozole  1 mg Oral Daily  . aspirin  325 mg Oral Daily  . atorvastatin  40 mg Oral QPM  . cholecalciferol  1,000 Units Oral Daily  . cloNIDine  0.2 mg Oral BID  . furosemide  40 mg Oral QODAY  . gabapentin  200 mg Oral TID  . heparin  5,000 Units Subcutaneous Q8H  . insulin aspart  0-5 Units Subcutaneous QHS  . insulin aspart  0-9 Units Subcutaneous TID WC  . mouth rinse  15 mL Mouth Rinse BID  . sodium chloride flush  3 mL Intravenous Q12H  . sodium chloride flush  3 mL Intravenous Q12H  . sodium phosphate  1 enema Rectal Once  . verapamil  240 mg Oral Daily   Continuous Infusions: . sodium chloride       LOS: 0 days   Time spent: 25 minutes.  Patrecia Pour, MD [facility] Pager [phone]  If 7PM-7AM, please contact night-coverage [email or web address] Password [facility] 06/26/2017, 4:05 PM

## 2017-06-27 DIAGNOSIS — J9601 Acute respiratory failure with hypoxia: Secondary | ICD-10-CM | POA: Diagnosis not present

## 2017-06-27 LAB — BASIC METABOLIC PANEL
Anion gap: 8 (ref 5–15)
BUN: 48 mg/dL — ABNORMAL HIGH (ref 6–20)
CO2: 27 mmol/L (ref 22–32)
Calcium: 8.9 mg/dL (ref 8.9–10.3)
Chloride: 103 mmol/L (ref 101–111)
Creatinine, Ser: 2.35 mg/dL — ABNORMAL HIGH (ref 0.44–1.00)
GFR calc Af Amer: 20 mL/min — ABNORMAL LOW (ref >60)
GFR calc non Af Amer: 17 mL/min — ABNORMAL LOW (ref >60)
Glucose, Bld: 152 mg/dL — ABNORMAL HIGH (ref 65–99)
Potassium: 4.1 mmol/L (ref 3.5–5.1)
Sodium: 138 mmol/L (ref 135–145)

## 2017-06-27 LAB — GLUCOSE, CAPILLARY
Glucose-Capillary: 150 mg/dL — ABNORMAL HIGH (ref 65–99)
Glucose-Capillary: 152 mg/dL — ABNORMAL HIGH (ref 65–99)

## 2017-06-27 MED ORDER — POLYETHYLENE GLYCOL 3350 17 G PO PACK: 17.0000 g | PACK | Freq: Every day | ORAL | 0 refills | Status: DC

## 2017-06-27 MED ORDER — SENNA 8.6 MG PO TABS: 1.0000 | ORAL_TABLET | Freq: Every day | ORAL | 0 refills | Status: DC

## 2017-06-27 MED ORDER — SODIUM CHLORIDE 0.9 % IV SOLN
INTRAVENOUS | Status: DC
  Administered 2017-06-27: 10:00:00 via INTRAVENOUS

## 2017-06-27 NOTE — Progress Notes (Signed)
Patient is requesting to return home via ambulance; home address verified; non emergent ambulance / [facility]AR called as requested; Aneta Mins [phone]

## 2017-06-27 NOTE — Evaluation (Signed)
Occupational Therapy Evaluation Patient Details Name: Sabrina [name] MRN: [id number] DOB: 10/22/1926 Today's Date: 06/27/2017    History of Present Illness Pt is an 84 y.o. female admitted 06/24/17 with SOB and low O2 sats per [facility]; worked up for acute hypoxic respiratory failure. Lung perfusion scan shows low probablity of PE. PMH includes breast CA, DVT/PE (not on anticoagulation), CVA (05/2017), CKD, CHF, HTN. Of note, recently d/c to [facility] SNF from 4/3-4/23 for rehab post-CVA (bilateral occipital infarcts); had discharged home day of new hospital admission.    Clinical Impression   PATIENT IS BEING D/C TODAY AND WILL NEED HOME HEALTH SERVICES. PATIENT IS NOW LEGALLY BLIND AND WILL NEED FURTHER OT TO MAXIMIZE AND AND SAFETY WITH ADLS, IADLS, AND MOBILITY IN HER HOME ENVIRONMENT.    Follow Up Recommendations  Home health OT    Equipment Recommendations  None recommended by OT    Recommendations for Other Services       Precautions / Restrictions Precautions Precautions: Fall Precaution Comments: impaired vision(PATIENT HAS DECREASED DEPTH PERCEPTON)      Mobility Bed Mobility                  Transfers       Sit to Stand: Supervision Stand pivot transfers: Min guard            Balance                                           ADL either performed or assessed with clinical judgement   ADL   Eating/Feeding: Set up   Grooming: Wash/dry hands;Wash/dry face;Oral care;Supervision/safety;Set up   Upper Body Bathing: Supervision/ safety;Set up   Lower Body Bathing: Supervison/ safety;Set up   Upper Body Dressing : Supervision/safety;Set up   Lower Body Dressing: Set up;Minimal assistance   Toilet Transfer: Min guard;Ambulation;Comfort height toilet;Grab bars   Toileting- Clothing Manipulation and Hygiene: Supervision/safety;Sit to/from stand       Functional mobility during ADLs: Min guard;Rolling walker General ADL Comments:  PATIENT REQURIES CUES AND ASSIST WITH STEERING WALKER SECONDARY TO LOW VISION.     Vision Baseline Vision/History: Legally blind(PATIENT RECENT HAD OCCIPITAL CVA ) Wears Glasses: At all times Patient Visual Report: (PATIENT IS ABLE TO READ LARGE PRINT. )       Perception     Praxis      Pertinent Vitals/Pain Pain Assessment: 0-10 Pain Score: 1  Pain Location: L HEEL  Pain Descriptors / Indicators: Aching     Hand Dominance Right   Extremity/Trunk Assessment Upper Extremity Assessment Upper Extremity Assessment: Generalized weakness(DECREASED B GRIP FROM OA)           Communication Communication Communication: No difficulties   Cognition Arousal/Alertness: Awake/alert Behavior During Therapy: WFL for tasks assessed/performed Overall Cognitive Status: Within Functional Limits for tasks assessed                                     General Comments       Exercises     Shoulder Instructions      Home Living Family/patient expects to be discharged to:: Private residence Living Arrangements: Spouse/significant other Available Help at Discharge: Family Type of Home: House Home Access: Stairs to enter Technical brewer of Steps: 2-back Entrance Stairs-Rails: None Home Layout: One level  Bathroom Shower/Tub: Occupational psychologist: Standard     Home Equipment: Cane - single point;Walker - 2 wheels   Additional Comments: Pt lives with spouse who is there 24/7, but cannot provide physical assist (he has [place] assist with medications)      Prior Functioning/Environment Level of Independence: Independent with assistive device(s)  Gait / Transfers Assistance Needed: Pt d/c home from SNF on 4/23 (day of new hospital admission). Was mod indep amb short distances with RW ADL's / Homemaking Assistance Needed: Reports mod indep with ADLs   Comments: HHRN assists husband medications; spouse has dementia        OT Problem List:         OT Treatment/Interventions:      OT Goals(Current goals can be found in the care plan section) Acute Rehab OT Goals Patient Stated Goal: GOING HOME TODAY  OT Frequency:     Barriers to D/C: Decreased caregiver support          Co-evaluation              AM-PAC PT "6 Clicks" Daily Activity     Outcome Measure Help from another person eating meals?: A Little Help from another person taking care of personal grooming?: A Little Help from another person toileting, which includes using toliet, bedpan, or urinal?: A Little Help from another person bathing (including washing, rinsing, drying)?: A Little Help from another person to put on and taking off regular upper body clothing?: A Little Help from another person to put on and taking off regular lower body clothing?: A Little 6 Click Score: 18   End of Session Equipment Utilized During Treatment: Gait belt;Rolling walker Nurse Communication: (TOLD NURSE THAT PNT WAS COMPLAINING OF L HEAL PAIN)  Activity Tolerance: Patient tolerated treatment well Patient left: in bed;with call bell/phone within reach;with bed alarm set                   Time: 5320-2334 OT Time Calculation (min): 41 min Charges:  OT General Charges $OT Visit: 1 Visit OT Treatments $Self Care/Home Management : 23-37 mins $Therapeutic Activity: 8-22 mins G-Codes:     6 CLICKS  Ozan Maclay 06/27/2017, 11:01 AM

## 2017-06-27 NOTE — Progress Notes (Signed)
Discharge instructions given, IV and tele removed. Questions answered.

## 2017-06-27 NOTE — Discharge Summary (Signed)
Physician Discharge Summary  Sabrina [name] KGY:[id number] DOB: 10-13-1926 DOA: 06/24/2017  PCP: [name], MD  Admit date: 06/24/2017 Discharge date: 06/27/2017  Admitted From: Home Disposition: Home   Recommendations for Outpatient Follow-up:  1. Follow up with PCP in 1-2 weeks  Home Health: PT, OT, RN, aide, CSW; also going to arrange caretaker assistance 8 hrs per day. Pt unable to return to SNF level of care due to being in co-pay days and unable to afford that. Equipment/Devices: None new Discharge Condition: Stable CODE STATUS: Full Diet recommendation: Heart healthy  Brief/Interim Summary:  84 y.o. female with medical history significant for breast cancer status post resection, history of DVT and PE no longer on anticoagulation, history of CVA last month, chronic diastolic CHF, and hypertension, now presenting to the emergency department with shortness of breath and low O2 sats.   Patient recently returned home after rehabilitating after her stroke, has had some nausea with nonbloody vomiting, and then developed acute onset of shortness of breath with low oxygen saturations per the report of her home health RN.  Patient denies chest pain, fevers, or chills.  ED Course: Upon arrival to the ED, patient is found to be afebrile, saturating 85% on room air, mildly tachypneic, mildly tachycardic, and with stable blood pressure.  EKG features a sinus tachycardia with rate 101 and chest x-ray is notable for chronic scarring in the right upper lung, but no acute findings.  Chemistry panel is notable for a creatinine of 1.83, consistent with her apparent baseline.  CBC features a new leukocytosis to 11,500 and stable normocytic anemia with hemoglobin of 11.0.  Troponin is elevated to 0.04, then to 0.09 few hours later.  Patient was treated with albuterol and Zofran in the ED.  She was started on supplemental oxygen.  She remains hemodynamically stable and will be observed on the telemetry unit  for ongoing evaluation and management of acute hypoxic respiratory failure.  Hypoxia resolved after antibiotics started. V/Q scan was low risk. Vanc/zosyn was transitioned to augmentin for presumed aspiration pneumonia in setting of vomiting.   The patient has struggled with constipation, so this was treated and had a bowel movement. She's tolerated breakfast this morning without nausea, no further vomiting since admission.   Discharge Diagnoses:  Principal Problem:   Acute respiratory failure with hypoxia (HCC) Active Problems:   Breast cancer of upper-outer quadrant of right female breast ([place])   History of pulmonary embolism   Accelerated hypertension   Chronic diastolic congestive heart failure (HCC)   CKD (chronic kidney disease), stage IV (HCC)   DM (diabetes mellitus) (HCC)   Elevated troponin  Acute hypoxic respiratory failure: No infiltrate on CXR, and low risk V/Q scan. Presume aspiration pneumonitis and pneumonia based on clinical history. Remains afebrile, leukocytosis improving. Strep pneumo and MRSA negative.  - Continue augmentin x7 days, ending on 4/29, BC ngtd on d/c   History of CVA: Recently discharged from SNF - Continue home health as ordered-legally blinfd 2/2 CVA -need ILR set-up?  Nausea and vomiting: Intermittent complaints which are now resolved.  - Recommend GI follow up, consider gastric emptying scan  - Continue scheduled bowel regimen  Elevated troponin: [place], Troponin elevated to 0.04, then 0.09 without chest pain. These are actually below her historic levels (0.14 - 0.15) Cont only asa at this point given risk of fall and Intra-cerebral bleed  CKD stage IV: SCr is 1.83 on admission, consistent with her apparent baseline  - Renally-dose medications, avoid nephrotoxins  Chronic diastolic CHF: Appears well-compensated. - Continue home medications.   Type II DM: A1c was 7.4% last month   - Return to home medications at discharge.  Breast  cancer: status-post definitive surgery  - Follows with oncology and managed with anastrozole, now 3 years in to planned 5 yrs of tx   -? Of pro-thrombotic effect of Anastrazole-Will need careful supervision as OP Oncologist  Hx of DVT and PE  - Hx of bilateral DVT and PE in April 2015, treated with coumadin, now off of [facility] as per Cardiology note 06/02/17 [in setting of fall and hematoma to head] -V/Q scan is low risk.   Discharge Instructions Discharge Instructions    Diet - low sodium heart healthy   Complete by:  As directed    Discharge instructions   Complete by:  As directed    You were admitted for cough, shortness of breath that was due to aspiration pneumonitis and aspiration pneumonia. With antibiotics you have improved and no longer need oxygen. You will continue taking antibiotics as below. You are medically stable for discharge. Insurance will no longer pay for you to be at a nursing facility and physical therapy has recommended that you have maximized home healthcare at home at discharge.  - Continue taking augmentin twice daily (sent to your pharmacy) to finish 6 more days of antibiotics.  - Continue taking colace and miralax. It's recommended that you use both twice daily every day unless you are experiencing diarrhea. This is to prevent constipation which may be causing the intermittent vomiting. Constipation is much easier to prevent than to treat.  - Your sister has been contacted by our Education officer, museum and is arranging a caretaker for assistance at home.  - If your symptoms return, seek medical attention right away. Otherwise follow up with your PCP in the next 1 -2  weeks.   Increase activity slowly   Complete by:  As directed      Allergies as of 06/27/2017   No Known Allergies     Medication List    TAKE these medications   allopurinol 100 MG tablet Commonly known as:  ZYLOPRIM Take 100 mg by mouth daily.   amoxicillin-clavulanate 875-125 MG tablet Commonly known  as:  AUGMENTIN Take 1 tablet by mouth every 12 (twelve) hours.   anastrozole 1 MG tablet Commonly known as:  ARIMIDEX Take 1 tablet (1 mg total) by mouth daily.   aspirin 325 MG EC tablet Take 1 tablet (325 mg total) by mouth daily.   atorvastatin 40 MG tablet Commonly known as:  LIPITOR Take 1 tablet (40 mg total) by mouth every evening.   BIOTIN 5000 PO Take 1 tablet by mouth daily.   cholecalciferol 1000 units tablet Commonly known as:  VITAMIN D Take 1,000 Units by mouth daily.   cloNIDine 0.2 MG tablet Commonly known as:  CATAPRES Take 0.2 mg by mouth 2 (two) times daily.   colchicine 0.6 MG tablet Take 0.6 mg by mouth as needed (GOUT).   docusate sodium 100 MG capsule Commonly known as:  COLACE Take 100 mg by mouth 2 (two) times daily as needed for mild constipation. Reported on 03/21/2015   ferrous sulfate 325 (65 FE) MG tablet Take 325 mg by mouth daily with breakfast.   furosemide 40 MG tablet Commonly known as:  LASIX TAKE 40 mg  TABLET EVERY OTHER DAY   gabapentin 100 MG capsule Commonly known as:  NEURONTIN Take 200 mg by mouth 3 (three) times  daily.   glipiZIDE 2.5 mg Tabs tablet Commonly known as:  GLUCOTROL Take 0.5 tablets (2.5 mg total) by mouth 2 (two) times daily before a meal. What changed:    when to take this  additional instructions   polyethylene glycol packet Commonly known as:  MIRALAX / GLYCOLAX Take 17 g by mouth daily as needed for mild constipation. What changed:  Another medication with the same name was added. Make sure you understand how and when to take each.   polyethylene glycol packet Commonly known as:  MIRALAX / GLYCOLAX Take 17 g by mouth daily. Start taking on:  06/28/2017 What changed:  You were already taking a medication with the same name, and this prescription was added. Make sure you understand how and when to take each.   senna 8.6 MG Tabs tablet Commonly known as:  SENOKOT Take 1 tablet (8.6 mg total) by  mouth daily. Start taking on:  06/28/2017   traMADol 50 MG tablet Commonly known as:  ULTRAM Take 50 mg by mouth every 6 (six) hours as needed.   verapamil 240 MG 24 hr capsule Commonly known as:  VERELAN PM Take 240 mg by mouth daily.   vitamin C 500 MG tablet Commonly known as:  ASCORBIC ACID Take 500 mg by mouth daily.      Follow-up Information    [name], MD.   Specialty:  Internal Medicine Why:  office will call         No Known Allergies  Consultations:  None  Procedures/Studies: Dg Chest 2 View  Result Date: 06/24/2017 CLINICAL DATA:  Shortness of breath for 1 day. History of renal disease, hypertension, diabetes. Nonsmoker. EXAM: CHEST - 2 VIEW COMPARISON:  05/30/2017 FINDINGS: Somewhat shallow inspiration. Heart size and pulmonary vascularity are normal. Linear scarring in the right upper lung. No airspace disease or consolidation. No blunting of costophrenic angles. No pneumothorax. Mediastinal contours appear intact. Degenerative changes in the spine and shoulders. Surgical clips in the right axilla. IMPRESSION: Chronic scarring in the right upper lung. No evidence of active pulmonary disease. Electronically Signed   By: Lucienne Capers M.D.   On: 06/24/2017 21:12   Dg Ribs Unilateral W/chest Right  Result Date: 05/30/2017 CLINICAL DATA:  Fall landing on walker.  Right rib pain. EXAM: RIGHT RIBS AND CHEST - 3+ VIEW COMPARISON:  Chest x-ray from 3 days ago FINDINGS: Artifact from EKG leads. Cholelithiasis. There is no edema, consolidation, effusion, or pneumothorax. Hazy peripheral opacity at the right apex that is chronic based on 2017 chest CT and likely from radiation fibrosis. Normal heart size. Stable mediastinal contours. Mitral annular calcification. Postoperative right axilla and breast. No visible rib fracture. IMPRESSION: 1. No visible rib fracture or intrathoracic injury. 2. Cholelithiasis. Electronically Signed   By: Monte Fantasia M.D.   On:  05/30/2017 12:41   Dg Abd 1 View  Result Date: 06/25/2017 CLINICAL DATA:  Nausea, vomiting.  Abdominal distension. EXAM: ABDOMEN - 1 VIEW COMPARISON:  Radiographs of June 10, 2013. FINDINGS: No abnormal bowel dilatation is noted. Large amount of stool seen in the right colon and rectum. This may represent rectal impaction. No abnormal bowel dilatation is noted. Phleboliths are noted in the pelvis. IMPRESSION: No abnormal bowel dilatation is noted. Large amount of stool seen in the right colon and rectum, and rectal impaction cannot be excluded. Electronically Signed   By: Marijo Conception, M.D.   On: 06/25/2017 09:29   Ct Head Wo Contrast  Result Date:  05/30/2017 CLINICAL DATA:  Ground level fall last night. EXAM: CT HEAD WITHOUT CONTRAST CT CERVICAL SPINE WITHOUT CONTRAST TECHNIQUE: Multidetector CT imaging of the head and cervical spine was performed following the standard protocol without intravenous contrast. Multiplanar CT image reconstructions of the cervical spine were also generated. COMPARISON:  CT head dated May 27, 2017. FINDINGS: CT HEAD FINDINGS Brain: No evidence of acute infarction, hemorrhage, hydrocephalus, extra-axial collection or mass lesion/mass effect. Apparent hypodensity within the right greater than left occipital lobes. Stable mild atrophy and chronic microvascular ischemic changes. Vascular: Calcified atherosclerosis at the skullbase. No hyperdense vessel. Skull: Normal. Negative for fracture or focal lesion. Sinuses/Orbits: No acute finding. Other: None. CT CERVICAL SPINE FINDINGS Alignment: Trace stepwise anterolisthesis at C3-C4, C4-C5, and C5-C6 due to facet arthropathy. No traumatic malalignment. Skull base and vertebrae: No acute fracture. No primary bone lesion or focal pathologic process. Soft tissues and spinal canal: No prevertebral fluid or swelling. No visible canal hematoma. Disc levels: Mild to moderate disc height loss and uncovertebral hypertrophy from C5-C6  through C7-T1. Moderate to severe bilateral facet arthropathy throughout the cervical spine. Upper chest: Scarring/fibrosis in the right lung apex, possibly from prior radiation. Other: None. IMPRESSION: 1. No definite acute intracranial abnormality. Apparent hypodensity within the right greater than left occipital lobes is favored artifactual, less likely due to infarct or posterior reversible encephalopathy syndrome. Correlate for visual symptoms or hypertensive urgency. 2. No acute cervical spine fracture. Mild-to-moderate degenerative disc disease in the lower cervical spine. Moderate to severe facet arthropathy throughout the cervical spine. Electronically Signed   By: Titus Dubin M.D.   On: 05/30/2017 12:35   Ct Cervical Spine Wo Contrast  Result Date: 05/30/2017 CLINICAL DATA:  Ground level fall last night. EXAM: CT HEAD WITHOUT CONTRAST CT CERVICAL SPINE WITHOUT CONTRAST TECHNIQUE: Multidetector CT imaging of the head and cervical spine was performed following the standard protocol without intravenous contrast. Multiplanar CT image reconstructions of the cervical spine were also generated. COMPARISON:  CT head dated May 27, 2017. FINDINGS: CT HEAD FINDINGS Brain: No evidence of acute infarction, hemorrhage, hydrocephalus, extra-axial collection or mass lesion/mass effect. Apparent hypodensity within the right greater than left occipital lobes. Stable mild atrophy and chronic microvascular ischemic changes. Vascular: Calcified atherosclerosis at the skullbase. No hyperdense vessel. Skull: Normal. Negative for fracture or focal lesion. Sinuses/Orbits: No acute finding. Other: None. CT CERVICAL SPINE FINDINGS Alignment: Trace stepwise anterolisthesis at C3-C4, C4-C5, and C5-C6 due to facet arthropathy. No traumatic malalignment. Skull base and vertebrae: No acute fracture. No primary bone lesion or focal pathologic process. Soft tissues and spinal canal: No prevertebral fluid or swelling. No visible  canal hematoma. Disc levels: Mild to moderate disc height loss and uncovertebral hypertrophy from C5-C6 through C7-T1. Moderate to severe bilateral facet arthropathy throughout the cervical spine. Upper chest: Scarring/fibrosis in the right lung apex, possibly from prior radiation. Other: None. IMPRESSION: 1. No definite acute intracranial abnormality. Apparent hypodensity within the right greater than left occipital lobes is favored artifactual, less likely due to infarct or posterior reversible encephalopathy syndrome. Correlate for visual symptoms or hypertensive urgency. 2. No acute cervical spine fracture. Mild-to-moderate degenerative disc disease in the lower cervical spine. Moderate to severe facet arthropathy throughout the cervical spine. Electronically Signed   By: Titus Dubin M.D.   On: 05/30/2017 12:35   Mr Brain Wo Contrast  Result Date: 05/30/2017 CLINICAL DATA:  Blurry vision for a few days. Multiple recent falls. EXAM: MRI HEAD WITHOUT CONTRAST TECHNIQUE: Multiplanar, multiecho  pulse sequences of the brain and surrounding structures were obtained without intravenous contrast. COMPARISON:  Head CT 05/30/2017 and MRI 05/27/2017 FINDINGS: Brain: There are patchy acute infarcts in both occipital lobes which are new from the recent prior MRI and correlate with the hypodensities on today's earlier CT. No intracranial hemorrhage, mass, midline shift, or extra-axial fluid collection is identified. Elsewhere, T2 hyperintensities in the cerebral white matter bilaterally are unchanged from the prior MRI and are nonspecific but compatible with mild chronic small vessel ischemic disease. Generalized cerebral atrophy is not greater than expected for age. Vascular: Major intracranial vascular flow voids are preserved. Skull and upper cervical spine: Unremarkable bone marrow signal. Sinuses/Orbits: Unremarkable orbits. Paranasal sinuses and mastoid air cells are clear. Other: None. IMPRESSION: 1. Acute  bilateral occipital lobe infarcts. 2. Mild chronic small vessel ischemic disease. Electronically Signed   By: Logan Bores M.D.   On: 05/30/2017 16:02   Nm Pulmonary Perf And Vent  Result Date: 06/25/2017 CLINICAL DATA:  Generalized weakness and difficulty breathing EXAM: NUCLEAR MEDICINE VENTILATION - PERFUSION LUNG SCAN VIEWS: Anterior, posterior, left lateral, right lateral, RPO, LPO, RAO, LAO-ventilation and perfusion RADIOPHARMACEUTICALS:  30.0 mCi of Tc-15m DTPA aerosol inhalation and 5.4 mCi Tc65m-MAA IV COMPARISON:  Chest radiograph June 24, 2017 FINDINGS: Ventilation: There are scattered subsegmental ventilation defects bilaterally. There is a ventilation defect in the posterior segment of the left upper lobe which occupies a significant portion of this segment. Perfusion: There are scattered subsegmental perfusion defects which match ventilation defects. There is a perfusion defect in the posterior segment of the left upper lobe which matches the ventilation defect. No appreciable ventilation/perfusion mismatch is evident on this study. IMPRESSION: Matching subsegmental defects as well as a matching defect occupying much of the segment of the posterior segment left upper lobe. No appreciable ventilation/perfusion mismatch on either side. By PIOPED II criteria, this study constitutes an overall low probability of pulmonary embolus. Electronically Signed   By: Lowella Grip III M.D.   On: 06/25/2017 12:36   Subjective: No dyspnea, walked with PT down the hall with minimum assist. SpO2 checked and found to be >90% on room air. No chest pain. Feels diffusely weak. Her sister can arrange home health caretakers to assist daily in addition to home health being arranged here.   Discharge Exam: Vitals:   06/27/17 0501 06/27/17 0800  BP: (!) 116/55 (!) 121/52  Pulse: (!) 53 84  Resp: 18 16  Temp: 97.6 F (36.4 C) 97.9 F (36.6 C)  SpO2: 100% 97%   General: Pt is alert, awake, not in acute  distress Cardiovascular: RRR, S1/S2 +, no rubs, no gallops Respiratory: CTA bilaterally, no wheezing, no rhonchi Abdominal: Soft, NT, ND, bowel sounds + Extremities: Trace edema, no cyanosis  Labs: BNP (last 3 results) No results for input(s): BNP in the last 8760 hours. Basic Metabolic Panel: Recent Labs  Lab 06/24/17 2145 06/25/17 0913 06/27/17 0509  NA 136 138 138  K 3.9 4.2 4.1  CL 101 104 103  CO2 21* 24 27  GLUCOSE 201* 140* 152*  BUN 45* 45* 48*  CREATININE 1.83* 1.86* 2.35*  CALCIUM 8.7* 8.7* 8.9   Liver Function Tests: Recent Labs  Lab 06/24/17 2145  AST 30  ALT 29  ALKPHOS 98  BILITOT 0.8  PROT 6.5  ALBUMIN 3.4*   Recent Labs  Lab 06/24/17 2145  LIPASE 67*   No results for input(s): AMMONIA in the last 168 hours. CBC: Recent Labs  Lab 06/24/17  2145 06/25/17 0350  WBC 14.5* 10.7*  NEUTROABS 11.5* 8.3*  HGB 11.0* 10.2*  HCT 34.7* 31.6*  MCV 99.1 97.5  PLT 241 254   Cardiac Enzymes: Recent Labs  Lab 06/24/17 2145 06/25/17 0042  TROPONINI 0.04* 0.09*   BNP: Invalid input(s): POCBNP CBG: Recent Labs  Lab 06/26/17 0735 06/26/17 1133 06/26/17 1648 06/26/17 2111 06/27/17 0742  GLUCAP 170* 140* 134* 198* 150*   D-Dimer No results for input(s): DDIMER in the last 72 hours. Hgb A1c No results for input(s): HGBA1C in the last 72 hours. Lipid Profile No results for input(s): CHOL, HDL, LDLCALC, TRIG, CHOLHDL, LDLDIRECT in the last 72 hours. Thyroid function studies No results for input(s): TSH, T4TOTAL, T3FREE, THYROIDAB in the last 72 hours.  Invalid input(s): FREET3 Anemia work up No results for input(s): VITAMINB12, FOLATE, FERRITIN, TIBC, IRON, RETICCTPCT in the last 72 hours. Urinalysis    Component Value Date/Time   COLORURINE YELLOW 05/31/2017 0905   APPEARANCEUR CLEAR 05/31/2017 0905   LABSPEC 1.012 05/31/2017 0905   PHURINE 6.0 05/31/2017 0905   GLUCOSEU NEGATIVE 05/31/2017 0905   HGBUR SMALL (A) 05/31/2017 0905    BILIRUBINUR NEGATIVE 05/31/2017 0905   KETONESUR NEGATIVE 05/31/2017 0905   PROTEINUR NEGATIVE 05/31/2017 0905   UROBILINOGEN 0.2 09/13/2013 0523   NITRITE NEGATIVE 05/31/2017 0905   LEUKOCYTESUR LARGE (A) 05/31/2017 0905    Microbiology Recent Results (from the past 240 hour(s))  Culture, blood (routine x 2) Call MD if unable to obtain prior to antibiotics being given     Status: None (Preliminary result)   Collection Time: 06/25/17  3:40 AM  Result Value Ref Range Status   Specimen Description BLOOD LEFT ARM  Final   Special Requests   Final    BOTTLES DRAWN AEROBIC AND ANAEROBIC Blood Culture results may not be optimal due to an excessive volume of blood received in culture bottles   Culture   Final    NO GROWTH 2 DAYS Performed at [facility], [place] [address] [place], [place] [address]    Report Status PENDING  Incomplete  Culture, blood (routine x 2) Call MD if unable to obtain prior to antibiotics being given     Status: None (Preliminary result)   Collection Time: 06/25/17  4:00 AM  Result Value Ref Range Status   Specimen Description BLOOD LEFT HAND  Final   Special Requests   Final    AEROBIC BOTTLE ONLY Blood Culture results may not be optimal due to an excessive volume of blood received in culture bottles   Culture   Final    NO GROWTH 2 DAYS Performed at [facility] [address] [place], [place] [address]    Report Status PENDING  Incomplete  MRSA PCR Screening     Status: None   Collection Time: 06/25/17  8:48 PM  Result Value Ref Range Status   MRSA by PCR NEGATIVE NEGATIVE Final    Comment:        The GeneXpert MRSA Assay (FDA approved for NASAL specimens only), is one component of a comprehensive MRSA colonization surveillance program. It is not intended to diagnose MRSA infection nor to guide or monitor treatment for MRSA infections. Performed at [facility], [place] [address] [place], [place] [address]     Time coordinating  discharge: Approximately 20 minutes  Nita Sells, MD  [facility] 06/27/2017, 11:34 AM Pager [phone]

## 2017-06-30 DIAGNOSIS — Z7984 Long term (current) use of oral hypoglycemic drugs: Secondary | ICD-10-CM | POA: Diagnosis not present

## 2017-06-30 DIAGNOSIS — Z7982 Long term (current) use of aspirin: Secondary | ICD-10-CM | POA: Diagnosis not present

## 2017-06-30 DIAGNOSIS — Z8673 Personal history of transient ischemic attack (TIA), and cerebral infarction without residual deficits: Secondary | ICD-10-CM | POA: Diagnosis not present

## 2017-06-30 DIAGNOSIS — N184 Chronic kidney disease, stage 4 (severe): Secondary | ICD-10-CM | POA: Diagnosis not present

## 2017-06-30 DIAGNOSIS — E1122 Type 2 diabetes mellitus with diabetic chronic kidney disease: Secondary | ICD-10-CM | POA: Diagnosis not present

## 2017-06-30 DIAGNOSIS — Z86711 Personal history of pulmonary embolism: Secondary | ICD-10-CM | POA: Diagnosis not present

## 2017-06-30 DIAGNOSIS — R1310 Dysphagia, unspecified: Secondary | ICD-10-CM | POA: Diagnosis not present

## 2017-06-30 DIAGNOSIS — E785 Hyperlipidemia, unspecified: Secondary | ICD-10-CM | POA: Diagnosis not present

## 2017-06-30 DIAGNOSIS — I130 Hypertensive heart and chronic kidney disease with heart failure and stage 1 through stage 4 chronic kidney disease, or unspecified chronic kidney disease: Secondary | ICD-10-CM | POA: Diagnosis not present

## 2017-06-30 DIAGNOSIS — I5032 Chronic diastolic (congestive) heart failure: Secondary | ICD-10-CM | POA: Diagnosis not present

## 2017-06-30 DIAGNOSIS — Z86718 Personal history of other venous thrombosis and embolism: Secondary | ICD-10-CM | POA: Diagnosis not present

## 2017-06-30 DIAGNOSIS — J9601 Acute respiratory failure with hypoxia: Secondary | ICD-10-CM | POA: Diagnosis not present

## 2017-06-30 DIAGNOSIS — C50411 Malignant neoplasm of upper-outer quadrant of right female breast: Secondary | ICD-10-CM | POA: Diagnosis not present

## 2017-06-30 LAB — CULTURE, BLOOD (ROUTINE X 2)
Culture: NO GROWTH
Culture: NO GROWTH

## 2017-07-03 ENCOUNTER — Other Ambulatory Visit: Payer: Self-pay | Admitting: Geriatric Medicine

## 2017-07-03 DIAGNOSIS — R1314 Dysphagia, pharyngoesophageal phase: Secondary | ICD-10-CM

## 2017-07-03 DIAGNOSIS — I129 Hypertensive chronic kidney disease with stage 1 through stage 4 chronic kidney disease, or unspecified chronic kidney disease: Secondary | ICD-10-CM | POA: Diagnosis not present

## 2017-07-03 DIAGNOSIS — E1121 Type 2 diabetes mellitus with diabetic nephropathy: Secondary | ICD-10-CM | POA: Diagnosis not present

## 2017-07-03 DIAGNOSIS — N184 Chronic kidney disease, stage 4 (severe): Secondary | ICD-10-CM | POA: Diagnosis not present

## 2017-07-04 ENCOUNTER — Other Ambulatory Visit: Payer: Medicare Other

## 2017-07-09 ENCOUNTER — Ambulatory Visit
Admission: RE | Admit: 2017-07-09 | Discharge: 2017-07-09 | Disposition: A | Discharge status: Home / Self Care | Payer: Medicare Other | Source: Ambulatory Visit | Attending: Geriatric Medicine | Admitting: Geriatric Medicine

## 2017-07-09 DIAGNOSIS — R1310 Dysphagia, unspecified: Secondary | ICD-10-CM | POA: Diagnosis not present

## 2017-07-09 DIAGNOSIS — R1314 Dysphagia, pharyngoesophageal phase: Secondary | ICD-10-CM

## 2017-07-15 ENCOUNTER — Ambulatory Visit: Payer: Medicare Other | Admitting: Cardiology

## 2017-07-17 DIAGNOSIS — M5416 Radiculopathy, lumbar region: Secondary | ICD-10-CM | POA: Diagnosis not present

## 2017-07-25 ENCOUNTER — Telehealth: Payer: Self-pay | Admitting: Oncology

## 2017-07-25 NOTE — Telephone Encounter (Signed)
GM PAL 5/28. Per GM moved appointments to 6/7. Spoke with patient she is aware.

## 2017-07-29 ENCOUNTER — Ambulatory Visit: Payer: Medicare Other | Admitting: Oncology

## 2017-07-29 ENCOUNTER — Ambulatory Visit: Payer: Medicare Other

## 2017-07-29 ENCOUNTER — Other Ambulatory Visit: Payer: Medicare Other

## 2017-07-30 ENCOUNTER — Encounter: Payer: Self-pay | Admitting: Neurology

## 2017-07-30 ENCOUNTER — Ambulatory Visit: Payer: Medicare Other | Admitting: Neurology

## 2017-07-30 VITALS — BP 144/63 | HR 67 | Ht 59.0 in | Wt 164.0 lb

## 2017-07-30 DIAGNOSIS — H538 Other visual disturbances: Secondary | ICD-10-CM | POA: Diagnosis not present

## 2017-07-30 DIAGNOSIS — H543 Unqualified visual loss, both eyes: Secondary | ICD-10-CM

## 2017-07-30 DIAGNOSIS — I639 Cerebral infarction, unspecified: Secondary | ICD-10-CM | POA: Diagnosis not present

## 2017-07-30 NOTE — Patient Instructions (Signed)
Stroke Prevention °Some medical conditions and behaviors are associated with a higher chance of having a stroke. You can help prevent a stroke by making nutrition, lifestyle, and other changes, including managing any medical conditions you may have. °What nutrition changes can be made? °· Eat healthy foods. You can do this by: °? Choosing foods high in fiber, such as fresh fruits and vegetables and whole grains. °? Eating at least 5 or more servings of fruits and vegetables a day. Try to fill half of your plate at each meal with fruits and vegetables. °? Choosing lean protein foods, such as lean cuts of meat, poultry without skin, fish, tofu, beans, and nuts. °? Eating low-fat dairy products. °? Avoiding foods that are high in salt (sodium). This can help lower blood pressure. °? Avoiding foods that have saturated fat, trans fat, and cholesterol. This can help prevent high cholesterol. °? Avoiding processed and premade foods. °· Follow your health care provider's specific guidelines for losing weight, controlling high blood pressure (hypertension), lowering high cholesterol, and managing diabetes. These may include: °? Reducing your daily calorie intake. °? Limiting your daily sodium intake to 1,500 milligrams (mg). °? Using only healthy fats for cooking, such as olive oil, canola oil, or sunflower oil. °? Counting your daily carbohydrate intake. °What lifestyle changes can be made? °· Maintain a healthy weight. Talk to your health care provider about your ideal weight. °· Get at least 30 minutes of moderate physical activity at least 5 days a week. Moderate activity includes brisk walking, biking, and swimming. °· Do not use any products that contain nicotine or tobacco, such as cigarettes and e-cigarettes. If you need help quitting, ask your health care provider. It may also be helpful to avoid exposure to secondhand smoke. °· Limit alcohol intake to no more than 1 drink a day for nonpregnant women and 2 drinks a  day for men. One drink equals 12 oz of beer, 5 oz of wine, or 1½ oz of hard liquor. °· Stop any illegal drug use. °· Avoid taking birth control pills. Talk to your health care provider about the risks of taking birth control pills if: °? You are over 35 years old. °? You smoke. °? You get migraines. °? You have ever had a blood clot. °What other changes can be made? °· Manage your cholesterol levels. °? Eating a healthy diet is important for preventing high cholesterol. If cholesterol cannot be managed through diet alone, you may also need to take medicines. °? Take any prescribed medicines to control your cholesterol as told by your health care provider. °· Manage your diabetes. °? Eating a healthy diet and exercising regularly are important parts of managing your blood sugar. If your blood sugar cannot be managed through diet and exercise, you may need to take medicines. °? Take any prescribed medicines to control your diabetes as told by your health care provider. °· Control your hypertension. °? To reduce your risk of stroke, try to keep your blood pressure below 130/80. °? Eating a healthy diet and exercising regularly are an important part of controlling your blood pressure. If your blood pressure cannot be managed through diet and exercise, you may need to take medicines. °? Take any prescribed medicines to control hypertension as told by your health care provider. °? Ask your health care provider if you should monitor your blood pressure at home. °? Have your blood pressure checked every year, even if your blood pressure is normal. Blood pressure increases with   age and some medical conditions. °· Get evaluated for sleep disorders (sleep apnea). Talk to your health care provider about getting a sleep evaluation if you snore a lot or have excessive sleepiness. °· Take over-the-counter and prescription medicines only as told by your health care provider. Aspirin or blood thinners (antiplatelets or  anticoagulants) may be recommended to reduce your risk of forming blood clots that can lead to stroke. °· Make sure that any other medical conditions you have, such as atrial fibrillation or atherosclerosis, are managed. °What are the warning signs of a stroke? °The warning signs of a stroke can be easily remembered as BEFAST. °· B is for balance. Signs include: °? Dizziness. °? Loss of balance or coordination. °? Sudden trouble walking. °· E is for eyes. Signs include: °? A sudden change in vision. °? Trouble seeing. °· F is for face. Signs include: °? Sudden weakness or numbness of the face. °? The face or eyelid drooping to one side. °· A is for arms. Signs include: °? Sudden weakness or numbness of the arm, usually on one side of the body. °· S is for speech. Signs include: °? Trouble speaking (aphasia). °? Trouble understanding. °· T is for time. °? These symptoms may represent a serious problem that is an emergency. Do not wait to see if the symptoms will go away. Get medical help right away. Call your local emergency services (911 in the [place] Do not drive yourself to the hospital. °· Other signs of stroke may include: °? A sudden, severe headache with no known cause. °? Nausea or vomiting. °? Seizure. ° °Where to find more information: °For more information, visit: °· American Stroke Association: www.strokeassociation.org °· National Stroke Association: www.stroke.org ° °Summary °· You can prevent a stroke by eating healthy, exercising, not smoking, limiting alcohol intake, and managing any medical conditions you may have. °· Do not use any products that contain nicotine or tobacco, such as cigarettes and e-cigarettes. If you need help quitting, ask your health care provider. It may also be helpful to avoid exposure to secondhand smoke. °· Remember BEFAST for warning signs of stroke. Get help right away if you or a loved one has any of these signs. °This information is not intended to replace advice given  to you by your health care provider. Make sure you discuss any questions you have with your health care provider. °Document Released: 03/28/2004 Document Revised: 03/26/2016 Document Reviewed: 03/26/2016 °Elsevier Interactive Patient Education © 2018 [employer] ° °

## 2017-07-30 NOTE — Progress Notes (Signed)
[facility]    Provider:  Dr [name] Referring Provider: [name], MD Primary Care Physician:  [name], MD  CC:  Bilateral occipital strokes  HPI:  Sabrina [name] is a 84 y.o. female here as a referral from Dr. Felipa Eth for bilateral occipital strokes.  She has a past medical history of frequent falls, hypertension, hyperlipidemia, history of DVT and PE previously on Coumadin, breast cancer 4 years ago, CHF, CKD who presented to the emergency room in April of this year with complaints of chest pain after a ground-level fall and blurry vision.  MRI showed bilateral occipital strokes likely embolic from unknown source.  She was compliant with her aspirin 81 mg and Lipitor 10 mg Regiment and she denied any other symptoms such as weakness.  She is here today with her husband and caretaker who provide information.  She is still having vision loss and vision changes, has not improved. Discussed this is likely due to occipital infarcts, ophthalmology may help with a low-vision program. No new weakness, no other new symptoms, she had a swallowing test.no significant coughing or aspiration. They have met with speech and swallow. She has not fallen since admission. She went to rehab after the hospital. She uses a walker and is compliant. She now has a PT coming in to her house to help her. She has a caretaker daily. Here with her caretaker and husband who also provide information. She needs a loop recorder placed, she missed her cardiology appointment bc she was in rehab at the time.. No headaches, no temporal pain, no jaw pain or fevers or other signs of temporal arteritis.   Reviewed notes, labs and personally reviewed imaging from outside physicians, which showed:  MRI brain 05/30/2017: IMPRESSION: 1. Acute bilateral occipital lobe infarcts. 2. Mild chronic small vessel ischemic disease.  Reviewed echocardiogram report which did not show thrombus or PFO with ejection  fraction in the range of 55 to 60%.  Carotid Dopplers showed 1 to 39% stenosis bilaterally  LDL 39 Hemoglobin A1c 7.4   Review of Systems: Patient complains of symptoms per HPI as well as the following symptoms: vision loss. Pertinent negatives and positives per HPI. All others negative.   Social History   Socioeconomic History  . Marital status: Married    Spouse name: Not on file  . Number of children: 0  . Years of education: Not on file  . Highest education level: Bachelor's degree (e.g., BA, AB, BS)  Occupational History  . Not on file  Social Needs  . Financial resource strain: Not on file  . Food insecurity:    Worry: Not on file    Inability: Not on file  . Transportation needs:    Medical: Not on file    Non-medical: Not on file  Tobacco Use  . Smoking status: Never Smoker  . Smokeless tobacco: Never Used  . Tobacco comment: only smoked 2 packs cigarettes total; husband quit in 1971  Substance and Sexual Activity  . Alcohol use: Yes    Alcohol/week: 1.8 oz    Types: 3 Glasses of wine per week  . Drug use: No  . Sexual activity: Not Currently  Lifestyle  . Physical activity:    Days per week: Not on file    Minutes per session: Not on file  . Stress: Not on file  Relationships  . Social connections:    Talks on phone: Not on file    Gets together: Not on file  Attends religious service: Not on file    Active member of club or organization: Not on file    Attends meetings of clubs or organizations: Not on file    Relationship status: Not on file  . Intimate partner violence:    Fear of current or ex partner: Not on file    Emotionally abused: Not on file    Physically abused: Not on file    Forced sexual activity: Not on file  Other Topics Concern  . Not on file  Social History Narrative   Lives at home with her husband   Right handed   Caffeine: 1 coffee daily at most     Family History  Problem Relation Age of Onset  . Pneumonia Mother     . Heart attack Father   . Breast cancer Other 50       niece  . Breast cancer Other 58       niece  . Ovarian cancer Other        niece    Past Medical History:  Diagnosis Date  . Arthritis    "mostly in my hands, lower back" (06/25/2017)  . Basal cell carcinoma (BCC) of face 1983  . Breast cancer, right breast ([place]) 03/2013  . Chronic kidney disease (CKD), stage III (moderate) ([facility])    nephrologist, Dr. [name]  . Dental crowns present   . DVT (deep venous thrombosis) ([place]) ~ 06/2013   "? side"  . Gout    "on daily RX" (06/25/2017)  . Heart murmur    no known problems; states did not know she had murmur until age 84  . High cholesterol   . Hypertension    fluctuates, especially when stressed; has been on med. > 20 yr.  . Immature cataract   . Non-insulin dependent type 2 diabetes mellitus ([place])   . Pulmonary embolism ([place]) ~ 06/2013  . Radiation 09/06/13-10/20/13   Right Breast Cancer  . Stroke ([place]) 05/2017   just visual problems since (06/25/2017)  . Wears partial dentures    lower    Past Surgical History:  Procedure Laterality Date  . AXILLARY LYMPH NODE DISSECTION Right 03/30/2013   Procedure: AXILLARY LYMPH NODE DISSECTION;  Surgeon: Rolm Bookbinder, MD;  Location: [facility];  Service: General;  Laterality: Right;  . BASAL CELL CARCINOMA EXCISION  1983   "face"  . BREAST BIOPSY Right 03/2013  . BREAST CYST EXCISION Right 11/1958   benign  . BREAST LUMPECTOMY WITH NEEDLE LOCALIZATION Right 03/30/2013   Procedure: BREAST LUMPECTOMY WITH NEEDLE LOCALIZATION;  Surgeon: Rolm Bookbinder, MD;  Location: [facility];  Service: General;  Laterality: Right;  . DILATION AND CURETTAGE OF UTERUS    . PORT-A-CATH REMOVAL  2016  . PORTACATH PLACEMENT N/A 04/15/2013   Procedure: INSERTION PORT-A-CATH;  Surgeon: Rolm Bookbinder, MD;  Location: [facility];  Service: General;  Laterality: N/A;  . RE-EXCISION OF BREAST CANCER,SUPERIOR MARGINS Right 04/15/2013    Procedure: RE-EXCISION OF RIGHT BREAST  MARGINS;  Surgeon: Rolm Bookbinder, MD;  Location: [facility];  Service: General;  Laterality: Right;  . TONSILLECTOMY  ~ 1935/1936    Current Outpatient Medications  Medication Sig Dispense Refill  . allopurinol (ZYLOPRIM) 100 MG tablet Take 100 mg by mouth daily.     [name] anastrozole (ARIMIDEX) 1 MG tablet Take 1 tablet (1 mg total) by mouth daily. 90 tablet 12  . aspirin EC 325 MG EC tablet Take 1 tablet (325 mg total)  by mouth daily.    [name] atorvastatin (LIPITOR) 40 MG tablet Take 1 tablet (40 mg total) by mouth every evening.    [name] BIOTIN 5000 PO Take 1 tablet by mouth daily.    . cholecalciferol (VITAMIN D) 1000 UNITS tablet Take 1,000 Units by mouth daily.    . cloNIDine (CATAPRES) 0.2 MG tablet Take 0.2 mg by mouth 2 (two) times daily.    [name] docusate sodium (COLACE) 100 MG capsule Take 100 mg by mouth 2 (two) times daily as needed for mild constipation. Reported on 03/21/2015    . ferrous sulfate 325 (65 FE) MG tablet Take 325 mg by mouth daily with breakfast.    . furosemide (LASIX) 40 MG tablet TAKE 40 mg  TABLET EVERY OTHER DAY 60 tablet 0  . gabapentin (NEURONTIN) 100 MG capsule Take 200 mg by mouth 3 (three) times daily.   0  . glipiZIDE (GLUCOTROL) 2.5 mg TABS tablet Take 0.5 tablets (2.5 mg total) by mouth 2 (two) times daily before a meal. (Patient taking differently: Take 1.25 mg by mouth 2 (two) times daily before a meal. ) 60 tablet 1  . polyethylene glycol (MIRALAX / GLYCOLAX) packet Take 17 g by mouth daily as needed for mild constipation.    . traMADol (ULTRAM) 50 MG tablet Take 50 mg by mouth every 6 (six) hours as needed.    . verapamil (VERELAN PM) 240 MG 24 hr capsule Take 240 mg by mouth daily.     . vitamin C (ASCORBIC ACID) 500 MG tablet Take 500 mg by mouth daily.    . colchicine 0.6 MG tablet Take 0.6 mg by mouth as needed (GOUT).     [name] senna (SENOKOT) 8.6 MG TABS tablet Take 1 tablet (8.6 mg total) by mouth daily.  (Patient not taking: Reported on 07/30/2017) 120 each 0   No current facility-administered medications for this visit.     Allergies as of 07/30/2017  . (No Known Allergies)    Vitals: BP (!) 144/63 (BP Location: Right Arm, Patient Position: Sitting)   Pulse 67   Ht _0  (1.499 m)   Wt 164 lb (74.4 kg)   BMI 33.12 kg/m  Last Weight:  Wt Readings from Last 1 Encounters:  07/30/17 164 lb (74.4 kg)   Last Height:   Ht Readings from Last 1 Encounters:  07/30/17 _1  (1.499 m)   Physical exam: Exam: Gen: NAD, conversant, well nourised, obese, well groomed                     CV: RRR, no MRG. No Carotid Bruits. + mild peripheral edema, warm, nontender Eyes: Conjunctivae clear without exudates or hemorrhage  Neuro: Detailed Neurologic Exam  Speech:    Speech is normal; fluent and spontaneous  Cognition:    The patient is oriented to person, place, and time;     recent and remote memory intact;     language fluent;     normal attention, concentration, fund of knowledge Cranial Nerves:    The pupils are equal, round, and reactive to light. Attempted fundoscopic exam could not visualize. Visual fields are full to finger confrontation. Extraocular movements are intact. Trigeminal sensation is intact and the muscles of mastication are normal. The face is symmetric. The palate elevates in the midline. Hearing intact. Voice is normal. Shoulder shrug is normal. The tongue has normal motion without fasciculations.   Coordination:    No dysmetria   Gait:  Shuffling with walker  Motor Observation:    No asymmetry, no atrophy, and no involuntary movements noted. Tone:    Normal muscle tone.    Posture:    Stooped slightlky    Strength:    Strength is V/V in the upper limbs, symmetric and equal in the lower extremities 4+/5 limited by arthritic pain     Sensation: intact to LT     Reflex Exam:  DTR's:    Deep tendon reflexes in the upper and lower extremities are  symmetrical bilaterally.   Toes:    The toes are downgoing bilaterally.   Clonus:    Clonus is absent.       Assessment/Plan:   84 y.o. female here as a referral from Dr. Felipa Eth for bilateral occipital strokes.  She has a past medical history of frequent falls, hypertension, hyperlipidemia, history of DVT and PE previously on Coumadin, breast cancer 4 years ago, CHF, CKD who presented to the emergency room in April of this year with complaints of chest pain after a ground-level fall and blurry vision.  MRI showed bilateral occipital strokes likely embolic from unknown source.    - She is still having vision loss and vision changes, has not improved. Discussed this is likely due to occipital infarcts, ophthalmology may help with a low-vision program. Will check esr and crp just in case.  - Patient missed her cardiology appointment bc she was in rehab, called their office today and arranged appointment for 6-6 and discussed with patient, husband and caretaker  - TEE inpatient was attempted but unsuccessful. Recommend loop recorder implant for paroxysmal A. fib. Continue aspirin for stroke prevention and aggressive risk factor modification.   Cc: Dr. Casilda Carls, [facility] [address] [place], [place] [address]  Phone [phone] Fax [phone]

## 2017-08-05 ENCOUNTER — Telehealth: Payer: Self-pay | Admitting: Cardiology

## 2017-08-05 NOTE — Telephone Encounter (Signed)
08-05-17 pt not having procedure Thursday but a consult to discuss procedure, questions will be answered at that time, pt aware

## 2017-08-05 NOTE — Telephone Encounter (Signed)
No answer on home number.  Voicemail not set up on mobile number.

## 2017-08-05 NOTE — Telephone Encounter (Signed)
New Message:      Pt is calling to ask some questions about her upcoming procedure on Thursday.

## 2017-08-07 ENCOUNTER — Ambulatory Visit: Payer: Medicare Other | Admitting: Internal Medicine

## 2017-08-07 ENCOUNTER — Other Ambulatory Visit: Payer: Self-pay | Admitting: *Deleted

## 2017-08-07 ENCOUNTER — Encounter: Payer: Self-pay | Admitting: Internal Medicine

## 2017-08-07 ENCOUNTER — Ambulatory Visit: Payer: Medicare Other | Admitting: Cardiology

## 2017-08-07 VITALS — BP 124/58 | HR 76 | Ht 59.0 in | Wt 163.0 lb

## 2017-08-07 DIAGNOSIS — Z170 Estrogen receptor positive status [ER+]: Secondary | ICD-10-CM

## 2017-08-07 DIAGNOSIS — I639 Cerebral infarction, unspecified: Secondary | ICD-10-CM

## 2017-08-07 DIAGNOSIS — C50411 Malignant neoplasm of upper-outer quadrant of right female breast: Secondary | ICD-10-CM

## 2017-08-07 NOTE — H&P (View-Only) (Signed)
HPI Sabrina [name] is referred today for evaluation of cryptogenic stroke. She is a  Pleasant elderly woman, with a h/o HTN, obesity, and DM, who sustained a stroke several weeks ago. She did not initially undergo insertion of an ILR. She presents today to consider insertion of an ILR. She denies any new neurological symptoms. No Known Allergies   Current Outpatient Medications  Medication Sig Dispense Refill  . allopurinol (ZYLOPRIM) 100 MG tablet Take 100 mg by mouth daily.     [name] anastrozole (ARIMIDEX) 1 MG tablet Take 1 tablet (1 mg total) by mouth daily. 90 tablet 12  . aspirin EC 325 MG EC tablet Take 1 tablet (325 mg total) by mouth daily.    [name] atorvastatin (LIPITOR) 40 MG tablet Take 1 tablet (40 mg total) by mouth every evening.    [name] BIOTIN 5000 PO Take 1 tablet by mouth daily.    . cholecalciferol (VITAMIN D) 1000 UNITS tablet Take 1,000 Units by mouth daily.    . cloNIDine (CATAPRES) 0.2 MG tablet Take 0.2 mg by mouth 2 (two) times daily.    . colchicine 0.6 MG tablet Take 0.6 mg by mouth as needed (GOUT).     [name] docusate sodium (COLACE) 100 MG capsule Take 100 mg by mouth 2 (two) times daily as needed for mild constipation. Reported on 03/21/2015    . ferrous sulfate 325 (65 FE) MG tablet Take 325 mg by mouth daily with breakfast.    . furosemide (LASIX) 40 MG tablet TAKE 40 mg  TABLET EVERY OTHER DAY 60 tablet 0  . gabapentin (NEURONTIN) 100 MG capsule Take 200 mg by mouth 3 (three) times daily.   0  . glipiZIDE (GLUCOTROL) 5 MG tablet Take by mouth daily before breakfast. 5mg  in the am, 2.5mg  in the pm    . polyethylene glycol (MIRALAX / GLYCOLAX) packet Take 17 g by mouth daily as needed for mild constipation.    . senna (SENOKOT) 8.6 MG TABS tablet Take 1 tablet (8.6 mg total) by mouth daily. 120 each 0  . traMADol (ULTRAM) 50 MG tablet Take 50 mg by mouth every 6 (six) hours as needed.    . verapamil (VERELAN PM) 180 MG 24 hr capsule Take 180 mg by mouth at bedtime.    .  vitamin C (ASCORBIC ACID) 500 MG tablet Take 500 mg by mouth daily.     No current facility-administered medications for this visit.      Past Medical History:  Diagnosis Date  . Arthritis    "mostly in my hands, lower back" (06/25/2017)  . Basal cell carcinoma (BCC) of face 1983  . Breast cancer, right breast ([place]) 03/2013  . Chronic kidney disease (CKD), stage III (moderate) (HCC)    nephrologist, Dr. [name]  . Dental crowns present   . DVT (deep venous thrombosis) ([place]) ~ 06/2013   "? side"  . Gout    "on daily RX" (06/25/2017)  . Heart murmur    no known problems; states did not know she had murmur until age 76  . High cholesterol   . Hypertension    fluctuates, especially when stressed; has been on med. > 20 yr.  . Immature cataract   . Non-insulin dependent type 2 diabetes mellitus ([place])   . Pulmonary embolism ([place]) ~ 06/2013  . Radiation 09/06/13-10/20/13   Right Breast Cancer  . Stroke ([place]) 05/2017   just visual problems since (06/25/2017)  . Wears partial dentures  lower    ROS:   All systems reviewed and negative except as noted in the HPI.   Past Surgical History:  Procedure Laterality Date  . AXILLARY LYMPH NODE DISSECTION Right 03/30/2013   Procedure: AXILLARY LYMPH NODE DISSECTION;  Surgeon: Rolm Bookbinder, MD;  Location: [facility];  Service: General;  Laterality: Right;  . BASAL CELL CARCINOMA EXCISION  1983   "face"  . BREAST BIOPSY Right 03/2013  . BREAST CYST EXCISION Right 11/1958   benign  . BREAST LUMPECTOMY WITH NEEDLE LOCALIZATION Right 03/30/2013   Procedure: BREAST LUMPECTOMY WITH NEEDLE LOCALIZATION;  Surgeon: Rolm Bookbinder, MD;  Location: [facility];  Service: General;  Laterality: Right;  . DILATION AND CURETTAGE OF UTERUS    . PORT-A-CATH REMOVAL  2016  . PORTACATH PLACEMENT N/A 04/15/2013   Procedure: INSERTION PORT-A-CATH;  Surgeon: Rolm Bookbinder, MD;  Location: [facility];  Service: General;  Laterality: N/A;    . RE-EXCISION OF BREAST CANCER,SUPERIOR MARGINS Right 04/15/2013   Procedure: RE-EXCISION OF RIGHT BREAST  MARGINS;  Surgeon: Rolm Bookbinder, MD;  Location: [facility];  Service: General;  Laterality: Right;  . TONSILLECTOMY  ~ 1935/1936     Family History  Problem Relation Age of Onset  . Pneumonia Mother   . Heart attack Father   . Breast cancer Other 29       niece  . Breast cancer Other 9       niece  . Ovarian cancer Other        niece     Social History   Socioeconomic History  . Marital status: Married    Spouse name: Not on file  . Number of children: 0  . Years of education: Not on file  . Highest education level: Bachelor's degree (e.g., BA, AB, BS)  Occupational History  . Not on file  Social Needs  . Financial resource strain: Not on file  . Food insecurity:    Worry: Not on file    Inability: Not on file  . Transportation needs:    Medical: Not on file    Non-medical: Not on file  Tobacco Use  . Smoking status: Never Smoker  . Smokeless tobacco: Never Used  . Tobacco comment: only smoked 2 packs cigarettes total; husband quit in 1971  Substance and Sexual Activity  . Alcohol use: Yes    Alcohol/week: 1.8 oz    Types: 3 Glasses of wine per week  . Drug use: No  . Sexual activity: Not Currently  Lifestyle  . Physical activity:    Days per week: Not on file    Minutes per session: Not on file  . Stress: Not on file  Relationships  . Social connections:    Talks on phone: Not on file    Gets together: Not on file    Attends religious service: Not on file    Active member of club or organization: Not on file    Attends meetings of clubs or organizations: Not on file    Relationship status: Not on file  . Intimate partner violence:    Fear of current or ex partner: Not on file    Emotionally abused: Not on file    Physically abused: Not on file    Forced sexual activity: Not on file  Other Topics Concern  . Not on file   Social History Narrative   Lives at home with her husband   Right handed   Caffeine: 1 coffee  daily at most      BP (!) 124/58   Pulse 76   Ht 4\' 11"  (1.499 m)   Wt 163 lb (73.9 kg)   BMI 32.92 kg/m   Physical Exam:  Elderly appearing NAD HEENT: Unremarkable Neck:  No JVD, no thyromegally Lymphatics:  No adenopathy Back:  No CVA tenderness73 yo woman, NAD Lungs:  Clear with no wheezes HEART:  Regular rate rhythm, no murmurs, no rubs, no clicks Abd:  soft, positive bowel sounds, no organomegally, no rebound, no guarding Ext:  2 plus pulses, no edema, no cyanosis, no clubbing Skin:  No rashes no nodules Neuro:  CN II through XII intact, motor grossly intact  EKG - none  Assess/Plan: 1. Cryptogenic stroke - the patient's has mostly recovered from her stroke. I was initially concerned that her multiple comorbidities, advanced age, and potential for falls would make systemic anti-coagulation contra-indicated which would make insertion of an ILR contra-indicated. I have reviewed these issues with the patient and her husband. I think that she would be a candidate for an [place] and for this reason, and because she is at increased risk for both atrial fib and recurrent stroke, recommended insertion of an ILR. This will be scheduled as able. 2. Sinus node dysfunction - her resting hr is a little slow today but she is asymptomatic. No changes in her meds for now. 3. HTN - her blood pressure is controlled on a combination of clonidine and verapamil.  Mikle Bosworth.D.

## 2017-08-07 NOTE — Patient Instructions (Addendum)
Medication Instructions:  Your physician recommends that you continue on your current medications as directed. Please refer to the Current Medication list given to you today.  Labwork: None ordered.  Testing/Procedures: None ordered.  Follow-Up:  You will follow up 7-10 days after your procedure for a wound check with our device clinic.  Loop procedure instructions:  Please arrive at the [facility] main entrance of [facility] hospital at:  8:30 am on August 15, 2017 and proceed to admitting You may eat and drink You may take your morning medications You will be discharged after your procedure  If you need a refill on your cardiac medications before your next appointment, please call your pharmacy.    Implantable Loop Recorder Placement An implantable loop recorder is a small electronic device that is placed under the skin of your chest. It is about the size of an AA ("double A") battery. The device records the electrical activity of your heart over a long period of time. Your health care provider can download these recordings to monitor your heart. You may need an implantable loop recorder if you have periods of abnormal heart activity (arrhythmias) or unexplained fainting (syncope) caused by a heart problem. Tell a health care provider about:  Any allergies you have.  All medicines you are taking, including vitamins, herbs, eye drops, creams, and over-the-counter medicines.  Any problems you or family members have had with anesthetic medicines.  Any blood disorders you have.  Any surgeries you have had.  Any medical conditions you have.  Whether you are pregnant or may be pregnant. What are the risks? Generally, this is a safe procedure. However, as with any procedure, problems may occur, including:  Infection.  Bleeding.  Allergic reactions to anesthetic medicines.  Damage to nerves or blood vessels.  Failure of the device to work. This could require another  surgery to replace it.  What happens before the procedure?   You may have a physical exam, blood tests, and imaging tests of your heart, such as a chest X-ray.  Follow instructions from your health care provider about eating or drinking restrictions.  Ask your health care provider about: ? Changing or stopping your regular medicines. This is especially important if you are taking diabetes medicines or blood thinners. ? Taking medicines such as aspirin and ibuprofen. These medicines can thin your blood. Do not take these medicines before your procedure if your surgeon instructs you not to.  Ask your health care provider how your surgical site will be marked or identified.  You may be given antibiotic medicine to help prevent infection.  Plan to have someone take you home after the procedure.  If you will be going home right after the procedure, plan to have someone with you for 24 hours.  Do not use any tobacco products, such as cigarettes, chewing tobacco, and e-cigarettes as told by your surgeon. If you need help quitting, ask your health care provider. What happens during the procedure?  To reduce your risk of infection: ? Your health care team will wash or sanitize their hands. ? Your skin will be washed with soap.  An IV tube will be inserted into one of your veins.  You may be given an antibiotic medicine through the IV tube.  You may be given one or more of the following: ? A medicine to help you relax (sedative). ? A medicine to numb the area (local anesthetic).  A small cut (incision) will be made on the left  side of your upper chest.  A pocket will be created under your skin.  The device will be placed in the pocket.  The incision will be closed with stitches (sutures) or adhesive strips.  A bandage (dressing) will be placed over the incision. The procedure may vary among health care providers and hospitals. What happens after the procedure?  Your blood  pressure, heart rate, breathing rate, and blood oxygen level will be monitored often until the medicines you were given have worn off.  You may be able to go home on the day of your surgery. Before going home: ? Your health care provider will program your recorder. ? You will learn how to trigger your device with a handheld activator. ? You will learn how to send recordings to your health care provider. ? You will get an ID card for your device, and you will be told when to use it.  Do not drive for 24 hours if you received a sedative. This information is not intended to replace advice given to you by your health care provider. Make sure you discuss any questions you have with your health care provider. Document Released: 01/30/2015 Document Revised: 07/27/2015 Document Reviewed: 11/23/2014 Elsevier Interactive Patient Education  [employer].

## 2017-08-07 NOTE — Progress Notes (Signed)
HPI Sabrina [name] is referred today for evaluation of cryptogenic stroke. She is a  Pleasant elderly woman, with a h/o HTN, obesity, and DM, who sustained a stroke several weeks ago. She did not initially undergo insertion of an ILR. She presents today to consider insertion of an ILR. She denies any new neurological symptoms. No Known Allergies   Current Outpatient Medications  Medication Sig Dispense Refill  . allopurinol (ZYLOPRIM) 100 MG tablet Take 100 mg by mouth daily.     [name] anastrozole (ARIMIDEX) 1 MG tablet Take 1 tablet (1 mg total) by mouth daily. 90 tablet 12  . aspirin EC 325 MG EC tablet Take 1 tablet (325 mg total) by mouth daily.    [name] atorvastatin (LIPITOR) 40 MG tablet Take 1 tablet (40 mg total) by mouth every evening.    [name] BIOTIN 5000 PO Take 1 tablet by mouth daily.    . cholecalciferol (VITAMIN D) 1000 UNITS tablet Take 1,000 Units by mouth daily.    . cloNIDine (CATAPRES) 0.2 MG tablet Take 0.2 mg by mouth 2 (two) times daily.    . colchicine 0.6 MG tablet Take 0.6 mg by mouth as needed (GOUT).     [name] docusate sodium (COLACE) 100 MG capsule Take 100 mg by mouth 2 (two) times daily as needed for mild constipation. Reported on 03/21/2015    . ferrous sulfate 325 (65 FE) MG tablet Take 325 mg by mouth daily with breakfast.    . furosemide (LASIX) 40 MG tablet TAKE 40 mg  TABLET EVERY OTHER DAY 60 tablet 0  . gabapentin (NEURONTIN) 100 MG capsule Take 200 mg by mouth 3 (three) times daily.   0  . glipiZIDE (GLUCOTROL) 5 MG tablet Take by mouth daily before breakfast. 5mg  in the am, 2.5mg  in the pm    . polyethylene glycol (MIRALAX / GLYCOLAX) packet Take 17 g by mouth daily as needed for mild constipation.    . senna (SENOKOT) 8.6 MG TABS tablet Take 1 tablet (8.6 mg total) by mouth daily. 120 each 0  . traMADol (ULTRAM) 50 MG tablet Take 50 mg by mouth every 6 (six) hours as needed.    . verapamil (VERELAN PM) 180 MG 24 hr capsule Take 180 mg by mouth at bedtime.    .  vitamin C (ASCORBIC ACID) 500 MG tablet Take 500 mg by mouth daily.     No current facility-administered medications for this visit.      Past Medical History:  Diagnosis Date  . Arthritis    "mostly in my hands, lower back" (06/25/2017)  . Basal cell carcinoma (BCC) of face 1983  . Breast cancer, right breast ([place]) 03/2013  . Chronic kidney disease (CKD), stage III (moderate) (HCC)    nephrologist, Dr. [name]  . Dental crowns present   . DVT (deep venous thrombosis) ([place]) ~ 06/2013   "? side"  . Gout    "on daily RX" (06/25/2017)  . Heart murmur    no known problems; states did not know she had murmur until age 13  . High cholesterol   . Hypertension    fluctuates, especially when stressed; has been on med. > 20 yr.  . Immature cataract   . Non-insulin dependent type 2 diabetes mellitus ([place])   . Pulmonary embolism ([place]) ~ 06/2013  . Radiation 09/06/13-10/20/13   Right Breast Cancer  . Stroke ([place]) 05/2017   just visual problems since (06/25/2017)  . Wears partial dentures  lower    ROS:   All systems reviewed and negative except as noted in the HPI.   Past Surgical History:  Procedure Laterality Date  . AXILLARY LYMPH NODE DISSECTION Right 03/30/2013   Procedure: AXILLARY LYMPH NODE DISSECTION;  Surgeon: Rolm Bookbinder, MD;  Location: [facility];  Service: General;  Laterality: Right;  . BASAL CELL CARCINOMA EXCISION  1983   "face"  . BREAST BIOPSY Right 03/2013  . BREAST CYST EXCISION Right 11/1958   benign  . BREAST LUMPECTOMY WITH NEEDLE LOCALIZATION Right 03/30/2013   Procedure: BREAST LUMPECTOMY WITH NEEDLE LOCALIZATION;  Surgeon: Rolm Bookbinder, MD;  Location: [facility];  Service: General;  Laterality: Right;  . DILATION AND CURETTAGE OF UTERUS    . PORT-A-CATH REMOVAL  2016  . PORTACATH PLACEMENT N/A 04/15/2013   Procedure: INSERTION PORT-A-CATH;  Surgeon: Rolm Bookbinder, MD;  Location: [facility];  Service: General;  Laterality: N/A;    . RE-EXCISION OF BREAST CANCER,SUPERIOR MARGINS Right 04/15/2013   Procedure: RE-EXCISION OF RIGHT BREAST  MARGINS;  Surgeon: Rolm Bookbinder, MD;  Location: [facility];  Service: General;  Laterality: Right;  . TONSILLECTOMY  ~ 1935/1936     Family History  Problem Relation Age of Onset  . Pneumonia Mother   . Heart attack Father   . Breast cancer Other 80       niece  . Breast cancer Other 56       niece  . Ovarian cancer Other        niece     Social History   Socioeconomic History  . Marital status: Married    Spouse name: Not on file  . Number of children: 0  . Years of education: Not on file  . Highest education level: Bachelor's degree (e.g., BA, AB, BS)  Occupational History  . Not on file  Social Needs  . Financial resource strain: Not on file  . Food insecurity:    Worry: Not on file    Inability: Not on file  . Transportation needs:    Medical: Not on file    Non-medical: Not on file  Tobacco Use  . Smoking status: Never Smoker  . Smokeless tobacco: Never Used  . Tobacco comment: only smoked 2 packs cigarettes total; husband quit in 1971  Substance and Sexual Activity  . Alcohol use: Yes    Alcohol/week: 1.8 oz    Types: 3 Glasses of wine per week  . Drug use: No  . Sexual activity: Not Currently  Lifestyle  . Physical activity:    Days per week: Not on file    Minutes per session: Not on file  . Stress: Not on file  Relationships  . Social connections:    Talks on phone: Not on file    Gets together: Not on file    Attends religious service: Not on file    Active member of club or organization: Not on file    Attends meetings of clubs or organizations: Not on file    Relationship status: Not on file  . Intimate partner violence:    Fear of current or ex partner: Not on file    Emotionally abused: Not on file    Physically abused: Not on file    Forced sexual activity: Not on file  Other Topics Concern  . Not on file   Social History Narrative   Lives at home with her husband   Right handed   Caffeine: 1 coffee  daily at most      BP (!) 124/58   Pulse 76   Ht 4\' 11"  (1.499 m)   Wt 163 lb (73.9 kg)   BMI 32.92 kg/m   Physical Exam:  Elderly appearing NAD HEENT: Unremarkable Neck:  No JVD, no thyromegally Lymphatics:  No adenopathy Back:  No CVA tenderness43 yo woman, NAD Lungs:  Clear with no wheezes HEART:  Regular rate rhythm, no murmurs, no rubs, no clicks Abd:  soft, positive bowel sounds, no organomegally, no rebound, no guarding Ext:  2 plus pulses, no edema, no cyanosis, no clubbing Skin:  No rashes no nodules Neuro:  CN II through XII intact, motor grossly intact  EKG - none  Assess/Plan: 1. Cryptogenic stroke - the patient's has mostly recovered from her stroke. I was initially concerned that her multiple comorbidities, advanced age, and potential for falls would make systemic anti-coagulation contra-indicated which would make insertion of an ILR contra-indicated. I have reviewed these issues with the patient and her husband. I think that she would be a candidate for an [place] and for this reason, and because she is at increased risk for both atrial fib and recurrent stroke, recommended insertion of an ILR. This will be scheduled as able. 2. Sinus node dysfunction - her resting hr is a little slow today but she is asymptomatic. No changes in her meds for now. 3. HTN - her blood pressure is controlled on a combination of clonidine and verapamil.  Mikle Bosworth.D.

## 2017-08-07 NOTE — Progress Notes (Deleted)
Electrophysiology Office Note   Date:  08/07/2017   ID:  Sabrina [name], DOB [birth year], MRN [id number]  PCP:  Sabrina Manes, MD  Cardiologist:  *** Primary Electrophysiologist: *** Sabrina Joo Meredith Leeds, MD    No chief complaint on file.    History of Present Illness: Sabrina [name] is a 84 y.o. female who is being seen today for the evaluation of *** at the request of Sabrina Manes, MD. Presenting today for electrophysiology evaluation.    Today, she denies*** symptoms of palpitations, chest pain, shortness of breath, orthopnea, PND, lower extremity edema, claudication, dizziness, presyncope, syncope, bleeding, or neurologic sequela. The patient is tolerating medications without difficulties.    Past Medical History:  Diagnosis Date  . Arthritis    "mostly in my hands, lower back" (06/25/2017)  . Basal cell carcinoma (BCC) of face 1983  . Breast cancer, right breast ([place]) 03/2013  . Chronic kidney disease (CKD), stage III (moderate) (HCC)    nephrologist, Dr. [name]  . Dental crowns present   . DVT (deep venous thrombosis) ([place]) ~ 06/2013   "? side"  . Gout    "on daily RX" (06/25/2017)  . Heart murmur    no known problems; states did not know she had murmur until age 60  . High cholesterol   . Hypertension    fluctuates, especially when stressed; has been on med. > 20 yr.  . Immature cataract   . Non-insulin dependent type 2 diabetes mellitus ([place])   . Pulmonary embolism ([place]) ~ 06/2013  . Radiation 09/06/13-10/20/13   Right Breast Cancer  . Stroke ([place]) 05/2017   just visual problems since (06/25/2017)  . Wears partial dentures    lower   Past Surgical History:  Procedure Laterality Date  . AXILLARY LYMPH NODE DISSECTION Right 03/30/2013   Procedure: AXILLARY LYMPH NODE DISSECTION;  Surgeon: Rolm Bookbinder, MD;  Location: [facility];  Service: General;  Laterality: Right;  . BASAL CELL CARCINOMA EXCISION  1983   "face"  . BREAST BIOPSY Right 03/2013  .  BREAST CYST EXCISION Right 11/1958   benign  . BREAST LUMPECTOMY WITH NEEDLE LOCALIZATION Right 03/30/2013   Procedure: BREAST LUMPECTOMY WITH NEEDLE LOCALIZATION;  Surgeon: Rolm Bookbinder, MD;  Location: [facility];  Service: General;  Laterality: Right;  . DILATION AND CURETTAGE OF UTERUS    . PORT-A-CATH REMOVAL  2016  . PORTACATH PLACEMENT N/A 04/15/2013   Procedure: INSERTION PORT-A-CATH;  Surgeon: Rolm Bookbinder, MD;  Location: [facility];  Service: General;  Laterality: N/A;  . RE-EXCISION OF BREAST CANCER,SUPERIOR MARGINS Right 04/15/2013   Procedure: RE-EXCISION OF RIGHT BREAST  MARGINS;  Surgeon: Rolm Bookbinder, MD;  Location: [facility];  Service: General;  Laterality: Right;  . TONSILLECTOMY  ~ 1935/1936     Current Outpatient Medications  Medication Sig Dispense Refill  . allopurinol (ZYLOPRIM) 100 MG tablet Take 100 mg by mouth daily.     [name] anastrozole (ARIMIDEX) 1 MG tablet Take 1 tablet (1 mg total) by mouth daily. 90 tablet 12  . aspirin EC 325 MG EC tablet Take 1 tablet (325 mg total) by mouth daily.    [name] atorvastatin (LIPITOR) 40 MG tablet Take 1 tablet (40 mg total) by mouth every evening.    [name] BIOTIN 5000 PO Take 1 tablet by mouth daily.    . cholecalciferol (VITAMIN D) 1000 UNITS tablet Take 1,000 Units by mouth daily.    . cloNIDine (CATAPRES) 0.2 MG tablet Take  0.2 mg by mouth 2 (two) times daily.    . colchicine 0.6 MG tablet Take 0.6 mg by mouth as needed (GOUT).     [name] docusate sodium (COLACE) 100 MG capsule Take 100 mg by mouth 2 (two) times daily as needed for mild constipation. Reported on 03/21/2015    . ferrous sulfate 325 (65 FE) MG tablet Take 325 mg by mouth daily with breakfast.    . furosemide (LASIX) 40 MG tablet TAKE 40 mg  TABLET EVERY OTHER DAY 60 tablet 0  . gabapentin (NEURONTIN) 100 MG capsule Take 200 mg by mouth 3 (three) times daily.   0  . glipiZIDE (GLUCOTROL) 2.5 mg TABS tablet Take 0.5 tablets (2.5 mg total) by  mouth 2 (two) times daily before a meal. (Patient taking differently: Take 1.25 mg by mouth 2 (two) times daily before a meal. ) 60 tablet 1  . polyethylene glycol (MIRALAX / GLYCOLAX) packet Take 17 g by mouth daily as needed for mild constipation.    . senna (SENOKOT) 8.6 MG TABS tablet Take 1 tablet (8.6 mg total) by mouth daily. (Patient not taking: Reported on 07/30/2017) 120 each 0  . traMADol (ULTRAM) 50 MG tablet Take 50 mg by mouth every 6 (six) hours as needed.    . verapamil (VERELAN PM) 240 MG 24 hr capsule Take 240 mg by mouth daily.     . vitamin C (ASCORBIC ACID) 500 MG tablet Take 500 mg by mouth daily.     No current facility-administered medications for this visit.     Allergies:   Patient has no known allergies.   Social History:  The patient  reports that she has never smoked. She has never used smokeless tobacco. She reports that she drinks about 1.8 oz of alcohol per week. She reports that she does not use drugs.   Family History:  The patient's ***family history includes Breast cancer (age of onset: 93) in her other; Breast cancer (age of onset: 22) in her other; Heart attack in her father; Ovarian cancer in her other; Pneumonia in her mother.    ROS:  Please see the history of present illness.   Otherwise, review of systems is positive for ***.   All other systems are reviewed and negative.    PHYSICAL EXAM: VS:  There were no vitals taken for this visit. , BMI There is no height or weight on file to calculate BMI. GEN: Well nourished, well developed, in no acute distress  HEENT: normal  Neck: no JVD, carotid bruits, or masses Cardiac: ***RRR; no murmurs, rubs, or gallops,no edema  Respiratory:  clear to auscultation bilaterally, normal work of breathing GI: soft, nontender, nondistended, + BS MS: no deformity or atrophy  Skin: warm and dry, *** device pocket is well healed Neuro:  Strength and sensation are intact Psych: euthymic mood, full affect  EKG:  EKG  {ACTION; IS/IS [id number]} ordered today. Personal review of the ekg ordered shows ***  *** Device interrogation is reviewed today in detail.  See PaceArt for details.   Recent Labs: 06/24/2017: ALT 29 06/25/2017: Hemoglobin 10.2; Platelets 254 06/27/2017: BUN 48; Creatinine, Ser 2.35; Potassium 4.1; Sodium 138    Lipid Panel     Component Value Date/Time   CHOL 101 05/31/2017 0524   TRIG 109 05/31/2017 0524   HDL 40 (L) 05/31/2017 0524   CHOLHDL 2.5 05/31/2017 0524   VLDL 22 05/31/2017 0524   LDLCALC 39 05/31/2017 0524     Wt Readings  from Last 3 Encounters:  07/30/17 164 lb (74.4 kg)  06/27/17 166 lb 12.8 oz (75.7 kg)  05/30/17 171 lb 1.2 oz (77.6 kg)      Other studies Reviewed: Additional studies/ records that were reviewed today include: TTE 05/27/17  Review of the above records today demonstrates:  - Left ventricle: The cavity size was normal. Wall thickness was   increased in a pattern of mild LVH. There was mild concentric   hypertrophy. Systolic function was normal. The estimated ejection   fraction was in the range of 55% to 60%. Wall motion was normal;   there were no regional wall motion abnormalities. Doppler   parameters are consistent with abnormal left ventricular   relaxation (grade 1 diastolic dysfunction). - Aortic valve: There was mild stenosis. Valve area (VTI): 1.75   cm^2. Valve area (Vmax): 1.8 cm^2. Valve area (Vmean): 1.7 cm^2. - Mitral valve: Severely calcified annulus. Valve area by   continuity equation (using LVOT flow): 1.95 cm^2. - Atrial septum: No defect or patent foramen ovale was identified.   ASSESSMENT AND PLAN:  1.  ***    Current medicines are reviewed at length with the patient today.   The patient {ACTIONS; HAS/DOES NOT HAVE:19233} concerns regarding her medicines.  The following changes were made today:  {NONE DEFAULTED:18576::"none"}  Labs/ tests ordered today include: *** No orders of the defined types were placed in  this encounter.    Disposition:   FU with Chrstopher Malenfant {gen number [id number]} {Days to years:10300}  Signed, Sabatino Williard Meredith Leeds, MD  08/07/2017 12:36 PM     [place] [address] [phone] (office) [phone] (fax)

## 2017-08-07 NOTE — Progress Notes (Signed)
[facility]  [phone] [phone]     ID: Sabrina [name] DOB: 10/09/1926  MR#: [id number]  JSH#:[id number]  PCP: [name], MD GYN: SU: Rolm Bookbinder OTHER MD: Thea Silversmith  CHIEF COMPLAINT: Estrogen receptor positive breast cancer  CURRENT TREATMENT: Anastrozole, denosumab   BREAST CANCER HISTORY: From Dr. Bernell List Khan's intake note 03/24/2013:  "Sabrina [name] is a 84 y.o. female. Who underwent a screening mammogram. She was found to have a right breast mass measuring 1 cm. There were no abnormalities on her physical exam. Patient had ultrasound performed that showed 1.3 x 1.2 x 1.0 cm hypoechoic mass in the upper outer quadrant of the right breast. In the right axilla there was an abnormal appearing lymph node measuring 1.65 1.4 x 0.8 cm. Both her biopsy. The lymph node was positive for metastatic carcinoma. The primary breast tumor biopsy showed invasive ductal carcinoma, grade 2, ER positive PR positive HER-2/neu equivocal with a proliferation marker Ki-67 15%.."  On 03/30/2013 the patient underwent right lumpectomy in right axillary lymph node sampling. The pathology ([id number]) showed a 1.4 cm invasive ductal carcinoma grade 3, present at the lateral and posterior margins. 3 axillary lymph nodes (non-sentinel lymph nodes) were sampled, one of which had a micrometastatic tumor deposits with extracapsular extension. Repeat HER-2 was positive, with a signals ratio of 2.54 and the number per cell of 3.55.  The patient was started on weekly Taxol and trastuzumab, but tolerated treatment poorly and develop bilateral DVTs and pulmonary embolism April 2015. At that time her chemotherapy was interrupted.  Her subsequent history is as detailed below.   INTERVAL HISTORY: Sabrina [name] returns today for follow-up of her estrogen receptor positive breast cancer accompanied by her husband. She continues on anastrozole, with good tolerance. She denies issues  with hot flashes or vaginal dryness.   The interval history was significant for an occipital stroke in March 2019 as well as a subsequent fall, leading to a subsequent rehab admission at [facility].   The stroke affected her vision. She has trouble seeing objects close to her. She uses a magnifying glass to help her read. She will follow up with Dr. Lovena Le on 08/12/2017.  She has home help care that helps throughout the week. They help fix the patient's meals and grocery shopping, and house chores.   Since her last visit, she underwent diagnostic bilateral mammography with CAD and tomography on 03/31/2017 at [facility] showing: breast density category B. There was no evidence of malignancy. Benign calcifications in the right.   She also receives denosumab/ Xgeva every 6 months with a dose due today. She tolerates this well.  Her last bone density on 08/21/2015 showed a T score of -2.2 osteopenia.   REVIEW OF SYSTEMS: Sabrina [name] reports that she is frustrated with her vision changes due to her occipital stroke. During the time the the patient was in the hospital, her husband was also experiencing health issues due to not taking his mediation. Home health continues to help the patient and her husband. She also had a fall after being discharged from the hospital for stroke. She stayed in the hospital for the fall for about 1 week. She went to [facility] rehabilitation center after this. She felt that they did not offer the support she needed. She notes several episodes of vomiting, which they minimally helped her with. There has been no unusual cough, phlegm production, or pleurisy. This been no change in bowel or bladder habits. She  denies unexplained fatigue or unexplained weight loss, bleeding, rash, or fever. A detailed review of systems was otherwise stable.    PAST MEDICAL HISTORY: Past Medical History:  Diagnosis Date  . Arthritis    "mostly in my hands, lower back" (06/25/2017)  . Basal  cell carcinoma (BCC) of face 1983  . Breast cancer, right breast ([place]) 03/2013  . Chronic kidney disease (CKD), stage III (moderate) (HCC)    nephrologist, Dr. [name]  . Dental crowns present   . DVT (deep venous thrombosis) ([place]) ~ 06/2013   "? side"  . Gout    "on daily RX" (06/25/2017)  . Heart murmur    no known problems; states did not know she had murmur until age 52  . High cholesterol   . Hypertension    fluctuates, especially when stressed; has been on med. > 20 yr.  . Immature cataract   . Non-insulin dependent type 2 diabetes mellitus ([name])   . Pulmonary embolism ([place]) ~ 06/2013  . Radiation 09/06/13-10/20/13   Right Breast Cancer  . Stroke ([place]) 05/2017   just visual problems since (06/25/2017)  . Wears partial dentures    lower    PAST SURGICAL HISTORY: Past Surgical History:  Procedure Laterality Date  . AXILLARY LYMPH NODE DISSECTION Right 03/30/2013   Procedure: AXILLARY LYMPH NODE DISSECTION;  Surgeon: Rolm Bookbinder, MD;  Location: [facility];  Service: General;  Laterality: Right;  . BASAL CELL CARCINOMA EXCISION  1983   "face"  . BREAST BIOPSY Right 03/2013  . BREAST CYST EXCISION Right 11/1958   benign  . BREAST LUMPECTOMY WITH NEEDLE LOCALIZATION Right 03/30/2013   Procedure: BREAST LUMPECTOMY WITH NEEDLE LOCALIZATION;  Surgeon: Rolm Bookbinder, MD;  Location: [facility];  Service: General;  Laterality: Right;  . DILATION AND CURETTAGE OF UTERUS    . PORT-A-CATH REMOVAL  2016  . PORTACATH PLACEMENT N/A 04/15/2013   Procedure: INSERTION PORT-A-CATH;  Surgeon: Rolm Bookbinder, MD;  Location: [facility];  Service: General;  Laterality: N/A;  . RE-EXCISION OF BREAST CANCER,SUPERIOR MARGINS Right 04/15/2013   Procedure: RE-EXCISION OF RIGHT BREAST  MARGINS;  Surgeon: Rolm Bookbinder, MD;  Location: [facility];  Service: General;  Laterality: Right;  . TONSILLECTOMY  ~ 1935/1936    FAMILY HISTORY Family History  Problem  Relation Age of Onset  . Pneumonia Mother   . Heart attack Father   . Breast cancer Other 80       niece  . Breast cancer Other 32       niece  . Ovarian cancer Other        niece   the patient's father died in his 38s, in the setting of multiple medical problems. The patient's mother died in her 83s from pneumonia. The patient had 4 sisters, one of whom has died from complications of diabetes. She had a brother who died at 20 months. There is no history of breast cancer in the immediate family although the patient does have 2 nieces with breast cancer. The patient has been tested for the BRCA mutations and her genetics was normal  GYNECOLOGIC HISTORY:  No LMP recorded. Patient is postmenopausal. Menarche age 77, the patient is GX P0. She went through menopause in her 44s, took hormone replacement until she was in her 59s.   SOCIAL HISTORY:  Sabrina [name] worked as the [employer] and locally. 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, more recently for the [employer]: In place   HEALTH MAINTENANCE: Social History   Tobacco Use  . Smoking status: Never Smoker  . Smokeless tobacco: Never Used  . Tobacco comment: only smoked 2 packs cigarettes total; husband quit in 1971  Substance Use Topics  . Alcohol use: Yes    Alcohol/week: 1.8 oz    Types: 3 Glasses of wine per week  . Drug use: No     Colonoscopy:   PAP:   Bone density: 08/21/2015 showed T score of -2.2 osteopenia   No Known Allergies  Current Outpatient Medications  Medication Sig Dispense Refill  . allopurinol (ZYLOPRIM) 100 MG tablet Take 100 mg by mouth daily.     [name] anastrozole (ARIMIDEX) 1 MG tablet Take 1 tablet (1 mg total) by mouth daily. 90 tablet 12  . aspirin EC 325 MG EC tablet Take 1 tablet (325 mg total) by mouth daily.    [name]  atorvastatin (LIPITOR) 40 MG tablet Take 1 tablet (40 mg total) by mouth every evening.    [name] BIOTIN 5000 PO Take 1 tablet by mouth daily.    . cholecalciferol (VITAMIN D) 1000 UNITS tablet Take 1,000 Units by mouth daily.    . cloNIDine (CATAPRES) 0.2 MG tablet Take 0.2 mg by mouth 2 (two) times daily.    . colchicine 0.6 MG tablet Take 0.6 mg by mouth as needed (GOUT).     [name] docusate sodium (COLACE) 100 MG capsule Take 100 mg by mouth 2 (two) times daily as needed for mild constipation. Reported on 03/21/2015    . ferrous sulfate 325 (65 FE) MG tablet Take 325 mg by mouth daily with breakfast.    . furosemide (LASIX) 40 MG tablet TAKE 40 mg  TABLET EVERY OTHER DAY 60 tablet 0  . gabapentin (NEURONTIN) 100 MG capsule Take 200 mg by mouth 3 (three) times daily.   0  . glipiZIDE (GLUCOTROL) 5 MG tablet Take by mouth daily before breakfast. 89m in the am, 2.557min the pm    . polyethylene glycol (MIRALAX / GLYCOLAX) packet Take 17 g by mouth daily as needed for mild constipation.    . senna (SENOKOT) 8.6 MG TABS tablet Take 1 tablet (8.6 mg total) by mouth daily. 120 each 0  . traMADol (ULTRAM) 50 MG tablet Take 50 mg by mouth every 6 (six) hours as needed.    . verapamil (VERELAN PM) 180 MG 24 hr capsule Take 180 mg by mouth at bedtime.    . vitamin C (ASCORBIC ACID) 500 MG tablet Take 500 mg by mouth daily.     No current facility-administered medications for this visit.     OBJECTIVE: Elderly white woman examined in a wheelchair Vitals:   08/08/17 1351  BP: (!) 153/47  Pulse: 62  Resp: 18  Temp: 97.6 F (36.4 C)  SpO2: 98%     Body mass index is 32.8 kg/m.    ECOG FS:2 - Symptomatic, <50% confined to bed  Sclerae unicteric, EOMs intact No cervical or supraclavicular adenopathy Lungs no rales or rhonchi Heart regular rate and rhythm Abd soft, obese, nontender, positive bowel sounds MSK no focal spinal tenderness Neuro: nonfocal, well oriented, appropriate affect Breasts: The right  breast is status post lumpectomy and radiation.  There is no evidence of local recurrence.  The left breast is unremarkable.  Both axillae are  benign.  LAB RESULTS:  CMP     Component Value Date/Time   NA 146 (H) 08/08/2017 1325   NA 140 01/30/2017 1053   K 4.2 08/08/2017 1325   K 4.4 01/30/2017 1053   CL 104 08/08/2017 1325   CO2 32 (H) 08/08/2017 1325   CO2 26 01/30/2017 1053   GLUCOSE 190 (H) 08/08/2017 1325   GLUCOSE 277 (H) 01/30/2017 1053   BUN 62 (H) 08/08/2017 1325   BUN 74.3 (H) 01/30/2017 1053   CREATININE 1.91 (H) 08/08/2017 1325   CREATININE 2.2 (H) 01/30/2017 1053   CALCIUM 10.9 (H) 08/08/2017 1325   CALCIUM 10.2 01/30/2017 1053   PROT 6.6 08/08/2017 1325   PROT 7.0 01/30/2017 1053   ALBUMIN 3.6 08/08/2017 1325   ALBUMIN 3.6 01/30/2017 1053   AST 12 08/08/2017 1325   AST 13 01/30/2017 1053   ALT 12 08/08/2017 1325   ALT 12 01/30/2017 1053   ALKPHOS 98 08/08/2017 1325   ALKPHOS 94 01/30/2017 1053   BILITOT 0.8 08/08/2017 1325   BILITOT 0.50 01/30/2017 1053   GFRNONAA 22 (L) 08/08/2017 1325   GFRAA 26 (L) 08/08/2017 1325    I No results found for: SPEP  Lab Results  Component Value Date   WBC 6.3 08/08/2017   NEUTROABS 4.0 08/08/2017   HGB 10.4 (L) 08/08/2017   HCT 31.3 (L) 08/08/2017   MCV 97.8 08/08/2017   PLT 200 08/08/2017      Chemistry      Component Value Date/Time   NA 146 (H) 08/08/2017 1325   NA 140 01/30/2017 1053   K 4.2 08/08/2017 1325   K 4.4 01/30/2017 1053   CL 104 08/08/2017 1325   CO2 32 (H) 08/08/2017 1325   CO2 26 01/30/2017 1053   BUN 62 (H) 08/08/2017 1325   BUN 74.3 (H) 01/30/2017 1053   CREATININE 1.91 (H) 08/08/2017 1325   CREATININE 2.2 (H) 01/30/2017 1053      Component Value Date/Time   CALCIUM 10.9 (H) 08/08/2017 1325   CALCIUM 10.2 01/30/2017 1053   ALKPHOS 98 08/08/2017 1325   ALKPHOS 94 01/30/2017 1053   AST 12 08/08/2017 1325   AST 13 01/30/2017 1053   ALT 12 08/08/2017 1325   ALT 12 01/30/2017 1053     BILITOT 0.8 08/08/2017 1325   BILITOT 0.50 01/30/2017 1053       No results found for: LABCA2  No components found for: [id number]  No results for input(s): INR in the last 168 hours.  Urinalysis    Component Value Date/Time   COLORURINE YELLOW 05/31/2017 0905   APPEARANCEUR CLEAR 05/31/2017 0905   LABSPEC 1.012 05/31/2017 0905   PHURINE 6.0 05/31/2017 0905   GLUCOSEU NEGATIVE 05/31/2017 0905   HGBUR SMALL (A) 05/31/2017 0905   BILIRUBINUR NEGATIVE 05/31/2017 0905   KETONESUR NEGATIVE 05/31/2017 0905   PROTEINUR NEGATIVE 05/31/2017 0905   UROBILINOGEN 0.2 09/13/2013 0523   NITRITE NEGATIVE 05/31/2017 0905   LEUKOCYTESUR LARGE (A) 05/31/2017 0905    STUDIES: Since her last visit, she underwent diagnostic bilateral mammography with CAD and tomography on 03/31/2017 at [facility] showing: breast density category B. There was no evidence of malignancy. Benign calcifications in the right.    ASSESSMENT: 84 y.o. Sabrina [name] woman status post right lumpectomy and axillary lymph node sampling 03/30/2013 for a pT1c pN1a, stage IIA invasive ductal carcinoma, grade 3, estrogen receptor 100% positive, progesterone receptor 100% positive, with an MIB-1 of 15%, and HER-2 amplified, with a signals  ratio of 2.54 and the number per cell being 3.55.  (a) margins were positive but cleared with additional surgery February 2015 ([id number])  (1) adjuvant chemotherapy /immunotherapy with paclitaxel and trastuzumab weekly started 05/10/2013, discontinued after 2 doses because of poor tolerance  (2) ventilation/perfusion scan 06/10/2013 documented right lower lobe pulmonary emboli; Doppler ultrasound 06/11/2013 documented bilateral lower extremity DVTs; started on warfarin managed through Dr. Carlyle Lipa office  (3) anastrozole started June 2015  (4) adjuvant radiation completed 10/20/2013  (5) Trastuzumab resumed 09/07/2013, continued through 06/07/2014;  final echocardiogram on  03/30/2016showed a well preserved ejection fraction  (6) genetics testing April 2015 was normal and did not reveal a mutation in any of these genes: APC, ATM, AXIN2, BARD1, BMPRIA, BRCA1, BRCA2, BRIP1, CDH1, CDK4, CDKN2A, CHEK2, EPCAM, FANCC, MLH1, MSH2, MSH6, MUTYH, NBN, PALB2, PMS2, PTEN, RAD51C, SMAD4, STK11, TP53, VHL, and XRCC2  (7) osteopenia: DEXA scan 08/17/2013 showed a T score of -2.1--  (a) started Prolia (denosumab) 01/11/2014, repeated every 6 months  (b) bone density 08/21/2015 showed a T score of -2.2 osteopenia  PLAN: Sabrina [name] has had a very difficult past few months, and is very much down because she cannot read.  I suggest that she call services for the blind who might provide her with tapes and other support especially as home health nursing likely will soon be exhausted.  From a breast cancer point of view she is doing quite well.  She will receive Prolia today and again in 6 months and then again one last time 12 months from now.  At that point she will have completed her 5 years of antiestrogen therapy and will be ready to "graduate"  She knows to call for any other issues that may develop before the next visit.    Lauri Till, Virgie Dad, MD  08/08/17 2:40 PM Medical Oncology and Hematology [facility] [address] [place], [place] [address] Tel. [phone]    Fax. [phone]  Alice Rieger, am acting as scribe for Chauncey Cruel MD.  I, Lurline Del MD, have reviewed the above documentation for accuracy and completeness, and I agree with the above.

## 2017-08-08 ENCOUNTER — Inpatient Hospital Stay: Payer: Medicare Other

## 2017-08-08 ENCOUNTER — Telehealth: Payer: Self-pay | Admitting: Oncology

## 2017-08-08 ENCOUNTER — Inpatient Hospital Stay: Payer: Medicare Other | Attending: Oncology

## 2017-08-08 ENCOUNTER — Inpatient Hospital Stay: Payer: Medicare Other | Admitting: Oncology

## 2017-08-08 VITALS — BP 153/47 | HR 62 | Temp 97.6°F | Resp 18 | Ht 59.0 in | Wt 162.4 lb

## 2017-08-08 DIAGNOSIS — Z923 Personal history of irradiation: Secondary | ICD-10-CM

## 2017-08-08 DIAGNOSIS — I639 Cerebral infarction, unspecified: Secondary | ICD-10-CM

## 2017-08-08 DIAGNOSIS — N183 Chronic kidney disease, stage 3 (moderate): Secondary | ICD-10-CM | POA: Insufficient documentation

## 2017-08-08 DIAGNOSIS — Z170 Estrogen receptor positive status [ER+]: Secondary | ICD-10-CM

## 2017-08-08 DIAGNOSIS — M8580 Other specified disorders of bone density and structure, unspecified site: Secondary | ICD-10-CM

## 2017-08-08 DIAGNOSIS — C50411 Malignant neoplasm of upper-outer quadrant of right female breast: Secondary | ICD-10-CM

## 2017-08-08 LAB — CMP (CANCER CENTER ONLY)
ALT: 12 U/L (ref 0–55)
AST: 12 U/L (ref 5–34)
Albumin: 3.6 g/dL (ref 3.5–5.0)
Alkaline Phosphatase: 98 U/L (ref 40–150)
Anion gap: 10 (ref 3–11)
BUN: 62 mg/dL — ABNORMAL HIGH (ref 7–26)
CO2: 32 mmol/L — ABNORMAL HIGH (ref 22–29)
Calcium: 10.9 mg/dL — ABNORMAL HIGH (ref 8.4–10.4)
Chloride: 104 mmol/L (ref 98–109)
Creatinine: 1.91 mg/dL — ABNORMAL HIGH (ref 0.60–1.10)
GFR, Est AFR Am: 26 mL/min — ABNORMAL LOW (ref >60)
GFR, Estimated: 22 mL/min — ABNORMAL LOW (ref >60)
Glucose, Bld: 190 mg/dL — ABNORMAL HIGH (ref 70–140)
Potassium: 4.2 mmol/L (ref 3.5–5.1)
Sodium: 146 mmol/L — ABNORMAL HIGH (ref 136–145)
Total Bilirubin: 0.8 mg/dL (ref 0.2–1.2)
Total Protein: 6.6 g/dL (ref 6.4–8.3)

## 2017-08-08 LAB — CBC WITH DIFFERENTIAL (CANCER CENTER ONLY)
Basophils Absolute: 0.1 10*3/uL (ref 0.0–0.1)
Basophils Relative: 1 %
Eosinophils Absolute: 0.3 10*3/uL (ref 0.0–0.5)
Eosinophils Relative: 4 %
HCT: 31.3 % — ABNORMAL LOW (ref 34.8–46.6)
Hemoglobin: 10.4 g/dL — ABNORMAL LOW (ref 11.6–15.9)
Lymphocytes Relative: 24 %
Lymphs Abs: 1.5 10*3/uL (ref 0.9–3.3)
MCH: 32.5 pg (ref 25.1–34.0)
MCHC: 33.3 g/dL (ref 31.5–36.0)
MCV: 97.8 fL (ref 79.5–101.0)
Monocytes Absolute: 0.4 10*3/uL (ref 0.1–0.9)
Monocytes Relative: 7 %
Neutro Abs: 4 10*3/uL (ref 1.5–6.5)
Neutrophils Relative %: 64 %
Platelet Count: 200 10*3/uL (ref 145–400)
RBC: 3.2 MIL/uL — ABNORMAL LOW (ref 3.70–5.45)
RDW: 16 % — ABNORMAL HIGH (ref 11.2–14.5)
WBC Count: 6.3 10*3/uL (ref 3.9–10.3)

## 2017-08-08 MED ORDER — DENOSUMAB 60 MG/ML ~~LOC~~ SOLN: 60.0000 mg | Freq: Once | SUBCUTANEOUS | Status: DC

## 2017-08-08 MED ORDER — DENOSUMAB 60 MG/ML ~~LOC~~ SOSY
60.0000 mg | PREFILLED_SYRINGE | Freq: Once | SUBCUTANEOUS | Status: AC
  Administered 2017-08-08: 60 mg via SUBCUTANEOUS

## 2017-08-08 NOTE — Telephone Encounter (Signed)
Gave avs and calendar ° °

## 2017-08-15 ENCOUNTER — Encounter (HOSPITAL_COMMUNITY)
Admission: RE | Disposition: A | Discharge status: Home / Self Care | Payer: Self-pay | Source: Ambulatory Visit | Attending: Internal Medicine

## 2017-08-15 ENCOUNTER — Emergency Department (HOSPITAL_COMMUNITY): Payer: Medicare Other

## 2017-08-15 ENCOUNTER — Emergency Department (HOSPITAL_COMMUNITY)
Admission: EM | Admit: 2017-08-15 | Discharge: 2017-08-16 | Disposition: A | Discharge status: Home / Self Care | Payer: Medicare Other | Source: Home / Self Care | Attending: Emergency Medicine | Admitting: Emergency Medicine

## 2017-08-15 ENCOUNTER — Ambulatory Visit (HOSPITAL_COMMUNITY)
Admission: RE | Admit: 2017-08-15 | Discharge: 2017-08-15 | Disposition: A | Discharge status: Home / Self Care | Payer: Medicare Other | Source: Ambulatory Visit | Attending: Internal Medicine | Admitting: Internal Medicine

## 2017-08-15 ENCOUNTER — Encounter (HOSPITAL_COMMUNITY): Payer: Self-pay | Admitting: Emergency Medicine

## 2017-08-15 DIAGNOSIS — Z923 Personal history of irradiation: Secondary | ICD-10-CM | POA: Insufficient documentation

## 2017-08-15 DIAGNOSIS — Z7982 Long term (current) use of aspirin: Secondary | ICD-10-CM | POA: Insufficient documentation

## 2017-08-15 DIAGNOSIS — Z8249 Family history of ischemic heart disease and other diseases of the circulatory system: Secondary | ICD-10-CM | POA: Insufficient documentation

## 2017-08-15 DIAGNOSIS — Z86718 Personal history of other venous thrombosis and embolism: Secondary | ICD-10-CM | POA: Diagnosis not present

## 2017-08-15 DIAGNOSIS — I129 Hypertensive chronic kidney disease with stage 1 through stage 4 chronic kidney disease, or unspecified chronic kidney disease: Secondary | ICD-10-CM | POA: Insufficient documentation

## 2017-08-15 DIAGNOSIS — Z85828 Personal history of other malignant neoplasm of skin: Secondary | ICD-10-CM | POA: Insufficient documentation

## 2017-08-15 DIAGNOSIS — M1990 Unspecified osteoarthritis, unspecified site: Secondary | ICD-10-CM | POA: Diagnosis not present

## 2017-08-15 DIAGNOSIS — Z803 Family history of malignant neoplasm of breast: Secondary | ICD-10-CM | POA: Diagnosis not present

## 2017-08-15 DIAGNOSIS — N184 Chronic kidney disease, stage 4 (severe): Secondary | ICD-10-CM | POA: Insufficient documentation

## 2017-08-15 DIAGNOSIS — I639 Cerebral infarction, unspecified: Secondary | ICD-10-CM | POA: Diagnosis not present

## 2017-08-15 DIAGNOSIS — Z8041 Family history of malignant neoplasm of ovary: Secondary | ICD-10-CM | POA: Insufficient documentation

## 2017-08-15 DIAGNOSIS — I10 Essential (primary) hypertension: Secondary | ICD-10-CM | POA: Diagnosis not present

## 2017-08-15 DIAGNOSIS — I130 Hypertensive heart and chronic kidney disease with heart failure and stage 1 through stage 4 chronic kidney disease, or unspecified chronic kidney disease: Secondary | ICD-10-CM

## 2017-08-15 DIAGNOSIS — N183 Chronic kidney disease, stage 3 (moderate): Secondary | ICD-10-CM | POA: Insufficient documentation

## 2017-08-15 DIAGNOSIS — E1122 Type 2 diabetes mellitus with diabetic chronic kidney disease: Secondary | ICD-10-CM | POA: Diagnosis not present

## 2017-08-15 DIAGNOSIS — I5032 Chronic diastolic (congestive) heart failure: Secondary | ICD-10-CM

## 2017-08-15 DIAGNOSIS — Z86711 Personal history of pulmonary embolism: Secondary | ICD-10-CM | POA: Insufficient documentation

## 2017-08-15 DIAGNOSIS — Z79899 Other long term (current) drug therapy: Secondary | ICD-10-CM | POA: Insufficient documentation

## 2017-08-15 DIAGNOSIS — R112 Nausea with vomiting, unspecified: Secondary | ICD-10-CM | POA: Diagnosis not present

## 2017-08-15 DIAGNOSIS — Z7984 Long term (current) use of oral hypoglycemic drugs: Secondary | ICD-10-CM | POA: Diagnosis not present

## 2017-08-15 DIAGNOSIS — F17210 Nicotine dependence, cigarettes, uncomplicated: Secondary | ICD-10-CM | POA: Diagnosis not present

## 2017-08-15 DIAGNOSIS — Z9889 Other specified postprocedural states: Secondary | ICD-10-CM | POA: Diagnosis not present

## 2017-08-15 DIAGNOSIS — R0902 Hypoxemia: Secondary | ICD-10-CM | POA: Diagnosis not present

## 2017-08-15 DIAGNOSIS — H269 Unspecified cataract: Secondary | ICD-10-CM | POA: Insufficient documentation

## 2017-08-15 DIAGNOSIS — M109 Gout, unspecified: Secondary | ICD-10-CM | POA: Diagnosis not present

## 2017-08-15 DIAGNOSIS — I6389 Other cerebral infarction: Secondary | ICD-10-CM | POA: Diagnosis not present

## 2017-08-15 DIAGNOSIS — Z853 Personal history of malignant neoplasm of breast: Secondary | ICD-10-CM | POA: Diagnosis not present

## 2017-08-15 DIAGNOSIS — E669 Obesity, unspecified: Secondary | ICD-10-CM | POA: Diagnosis not present

## 2017-08-15 DIAGNOSIS — E7800 Pure hypercholesterolemia, unspecified: Secondary | ICD-10-CM | POA: Insufficient documentation

## 2017-08-15 DIAGNOSIS — R1110 Vomiting, unspecified: Secondary | ICD-10-CM | POA: Diagnosis not present

## 2017-08-15 DIAGNOSIS — R1111 Vomiting without nausea: Secondary | ICD-10-CM | POA: Diagnosis not present

## 2017-08-15 HISTORY — PX: LOOP RECORDER INSERTION: EP1214

## 2017-08-15 LAB — COMPREHENSIVE METABOLIC PANEL
ALT: 13 U/L — ABNORMAL LOW (ref 14–54)
AST: 18 U/L (ref 15–41)
Albumin: 3.6 g/dL (ref 3.5–5.0)
Alkaline Phosphatase: 90 U/L (ref 38–126)
Anion gap: 11 (ref 5–15)
BUN: 51 mg/dL — ABNORMAL HIGH (ref 6–20)
CO2: 25 mmol/L (ref 22–32)
Calcium: 8.6 mg/dL — ABNORMAL LOW (ref 8.9–10.3)
Chloride: 107 mmol/L (ref 101–111)
Creatinine, Ser: 1.46 mg/dL — ABNORMAL HIGH (ref 0.44–1.00)
GFR calc Af Amer: 36 mL/min — ABNORMAL LOW (ref >60)
GFR calc non Af Amer: 31 mL/min — ABNORMAL LOW (ref >60)
Glucose, Bld: 203 mg/dL — ABNORMAL HIGH (ref 65–99)
Potassium: 4.3 mmol/L (ref 3.5–5.1)
Sodium: 143 mmol/L (ref 135–145)
Total Bilirubin: 0.8 mg/dL (ref 0.3–1.2)
Total Protein: 6.7 g/dL (ref 6.5–8.1)

## 2017-08-15 LAB — CBC
HCT: 32.9 % — ABNORMAL LOW (ref 36.0–46.0)
Hemoglobin: 10.2 g/dL — ABNORMAL LOW (ref 12.0–15.0)
MCH: 31.3 pg (ref 26.0–34.0)
MCHC: 31 g/dL (ref 30.0–36.0)
MCV: 100.9 fL — ABNORMAL HIGH (ref 78.0–100.0)
Platelets: 207 10*3/uL (ref 150–400)
RBC: 3.26 MIL/uL — ABNORMAL LOW (ref 3.87–5.11)
RDW: 14.6 % (ref 11.5–15.5)
WBC: 9.6 10*3/uL (ref 4.0–10.5)

## 2017-08-15 LAB — I-STAT TROPONIN, ED: Troponin i, poc: 0.01 ng/mL (ref 0.00–0.08)

## 2017-08-15 LAB — GLUCOSE, CAPILLARY: Glucose-Capillary: 145 mg/dL — ABNORMAL HIGH (ref 65–99)

## 2017-08-15 LAB — LIPASE, BLOOD: Lipase: 45 U/L (ref 11–51)

## 2017-08-15 LAB — I-STAT CG4 LACTIC ACID, ED: Lactic Acid, Venous: 1.17 mmol/L (ref 0.5–1.9)

## 2017-08-15 SURGERY — LOOP RECORDER INSERTION

## 2017-08-15 MED ORDER — LIDOCAINE-EPINEPHRINE 1 %-1:100000 IJ SOLN
INTRAMUSCULAR | Status: AC
  Filled 2017-08-15: qty 1

## 2017-08-15 MED ORDER — LIDOCAINE-EPINEPHRINE 1 %-1:100000 IJ SOLN
INTRAMUSCULAR | Status: DC | PRN
  Administered 2017-08-15: 10 mL

## 2017-08-15 MED ORDER — ONDANSETRON HCL 4 MG/2ML IJ SOLN: 4.0000 mg | Freq: Once | INTRAMUSCULAR | Status: DC

## 2017-08-15 MED ORDER — SODIUM CHLORIDE 0.9 % IV SOLN
INTRAVENOUS | Status: DC
  Administered 2017-08-15: 22:00:00 via INTRAVENOUS

## 2017-08-15 SURGICAL SUPPLY — 2 items
LOOP REVEAL LINQSYS (Prosthesis & Implant Heart) ×2 IMPLANT
PACK LOOP INSERTION (CUSTOM PROCEDURE TRAY) ×2 IMPLANT

## 2017-08-15 NOTE — Interval H&P Note (Signed)
History and Physical Interval Note:  08/15/2017 9:34 AM  Sabrina [name]  has presented today for surgery, with the diagnosis of stroke  The various methods of treatment have been discussed with the patient and family. After consideration of risks, benefits and other options for treatment, the patient has consented to  Procedure(s): LOOP RECORDER INSERTION (N/A) as a surgical intervention .  The patient's history has been reviewed, patient examined, no change in status, stable for surgery.  I have reviewed the patient's chart and labs.  Questions were answered to the patient's satisfaction.     [name]

## 2017-08-15 NOTE — ED Triage Notes (Signed)
Pt BIB [facility] from home with c/o nausea, vomiting, weakness and hypertension. Pt had loop recorder placed recently, NSR at this time. Given 4mg  zofran PTA. EMS vitals: Bp 208/92, HR-88, CBG 180

## 2017-08-15 NOTE — ED Provider Notes (Addendum)
[facility] EMERGENCY DEPARTMENT Provider Note   CSN: [id number] Arrival date & time: 08/15/17  2041     History   Chief Complaint Chief Complaint  Patient presents with  . Emesis  . Hypertension    HPI Sabrina [name] is a 84 y.o. female.  84 year old female presents with acute onset of chills with associated nausea and vomiting.  Denies any diarrhea.  Has had mild cough but denies any congestion.  No fever reported.  Denies any abdominal discomfort.  No chest discomfort as well.  No recent rashes.  No headache or photophobia.  Emesis was described as nonbilious or bloody.  Was found to be hypertensive at her residence.  Had a loop recorder placed today.  EMS called patient given Zofran and transported here.     Past Medical History:  Diagnosis Date  . Arthritis    "mostly in my hands, lower back" (06/25/2017)  . Basal cell carcinoma (BCC) of face 1983  . Breast cancer, right breast ([place]) 03/2013  . Chronic kidney disease (CKD), stage III (moderate) (HCC)    nephrologist, Dr. [name]  . Dental crowns present   . DVT (deep venous thrombosis) ([place]) ~ 06/2013   "? side"  . Gout    "on daily RX" (06/25/2017)  . Heart murmur    no known problems; states did not know she had murmur until age 78  . High cholesterol   . Hypertension    fluctuates, especially when stressed; has been on med. > 20 yr.  . Immature cataract   . Non-insulin dependent type 2 diabetes mellitus ([place])   . Pulmonary embolism () ~ 06/2013  . Radiation 09/06/13-10/20/13   Right Breast Cancer  . Stroke ([place]) 05/2017   just visual problems since (06/25/2017)  . Wears partial dentures    lower    Patient Active Problem List   Diagnosis Date Noted  . Blurry vision, bilateral   . Acute blood loss anemia   . History of breast cancer 05/30/2017  . Weakness 05/30/2017  . Occipital infarction ([place]) 05/30/2017  . Pressure injury of skin 05/28/2017  . Fall at home 05/27/2017  .  CKD (chronic kidney disease), stage IV ([place]) 05/27/2017  . High cholesterol 05/27/2017  . DM (diabetes mellitus) ([place]) 05/27/2017  . Hypertension 05/27/2017  . Gout 05/27/2017  . Elevated troponin 05/27/2017  . Lipoma of lower extremity 09/12/2014  . Abnormal x-ray 03/15/2014  . Chronic diastolic congestive heart failure ([place]) 02/23/2014  . Osteopenia 11/30/2013  . Hypoglycemia 09/13/2013  . Acute respiratory failure with hypoxia ([place]) 06/14/2013  . History of pulmonary embolism 06/10/2013  . Nausea and vomiting 06/10/2013  . Accelerated hypertension 06/10/2013  . Aortic stenosis 04/22/2013  . Edema leg 04/22/2013  . Breast cancer of upper-outer quadrant of right female breast ([place]) 03/19/2013    Past Surgical History:  Procedure Laterality Date  . AXILLARY LYMPH NODE DISSECTION Right 03/30/2013   Procedure: AXILLARY LYMPH NODE DISSECTION;  Surgeon: Rolm Bookbinder, MD;  Location: [place];  Service: General;  Laterality: Right;  . BASAL CELL CARCINOMA EXCISION  1983   "face"  . BREAST BIOPSY Right 03/2013  . BREAST CYST EXCISION Right 11/1958   benign  . BREAST LUMPECTOMY WITH NEEDLE LOCALIZATION Right 03/30/2013   Procedure: BREAST LUMPECTOMY WITH NEEDLE LOCALIZATION;  Surgeon: Rolm Bookbinder, MD;  Location: [place];  Service: General;  Laterality: Right;  . DILATION AND CURETTAGE OF UTERUS    . PORT-A-CATH REMOVAL  2016  .  PORTACATH PLACEMENT N/A 04/15/2013   Procedure: INSERTION PORT-A-CATH;  Surgeon: Rolm Bookbinder, MD;  Location: [place];  Service: General;  Laterality: N/A;  . RE-EXCISION OF BREAST CANCER,SUPERIOR MARGINS Right 04/15/2013   Procedure: RE-EXCISION OF RIGHT BREAST  MARGINS;  Surgeon: Rolm Bookbinder, MD;  Location: [place];  Service: General;  Laterality: Right;  . TONSILLECTOMY  ~ 1935/1936     OB History   None    Obstetric Comments  Menarche ae 14 No children Married 57 years         Home Medications     Prior to Admission medications   Medication Sig Start Date End Date Taking? Authorizing Provider  allopurinol (ZYLOPRIM) 100 MG tablet Take 100 mg by mouth daily.  01/17/16   [provider]  anastrozole (ARIMIDEX) 1 MG tablet Take 1 tablet (1 mg total) by mouth daily. 07/30/16   Magrinat, Virgie Dad, MD  aspirin EC 325 MG EC tablet Take 1 tablet (325 mg total) by mouth daily. 06/04/17   Patrecia Pour, MD  atorvastatin (LIPITOR) 40 MG tablet Take 1 tablet (40 mg total) by mouth every evening. 06/04/17   Patrecia Pour, MD  Biotin (BIOTIN 5000) 5 MG CAPS Take 5,000 Units by mouth daily.     [provider]  cholecalciferol (VITAMIN D) 1000 UNITS tablet Take 1,000 Units by mouth daily.    [provider]  cloNIDine (CATAPRES) 0.2 MG tablet Take 0.2 mg by mouth 2 (two) times daily.    [provider]  denosumab (PROLIA) 60 MG/ML SOSY injection Inject 60 mg into the skin every 6 (six) months.    [provider]  docusate sodium (COLACE) 100 MG capsule Take 100 mg by mouth daily as needed for moderate constipation.     [provider]  ferrous sulfate 325 (65 FE) MG tablet Take 325 mg by mouth daily with breakfast.    [provider]  furosemide (LASIX) 40 MG tablet TAKE 40 mg  TABLET EVERY OTHER DAY 06/04/17   Patrecia Pour, MD  gabapentin (NEURONTIN) 100 MG capsule Take 200 mg by mouth 3 (three) times daily.  05/19/17   [provider]  glipiZIDE (GLUCOTROL) 5 MG tablet Take 2.5 mg by mouth 2 (two) times daily before a meal.     [provider]  hydrocortisone cream 1 % Apply 1 application topically daily as needed for itching.    [provider]  polyethylene glycol (MIRALAX / GLYCOLAX) packet Take 17 g by mouth daily.     [provider]  senna (SENOKOT) 8.6 MG TABS tablet Take 1 tablet (8.6 mg total) by mouth daily. Patient not taking: Reported on 08/12/2017 06/28/17   Nita Sells, MD  traMADol  (ULTRAM) 50 MG tablet Take 50 mg by mouth 2 (two) times daily as needed for moderate pain.     [provider]  verapamil (VERELAN PM) 180 MG 24 hr capsule Take 180 mg by mouth at bedtime.    [provider]  vitamin C (ASCORBIC ACID) 500 MG tablet Take 500 mg by mouth daily.    [provider]    Family History Family History  Problem Relation Age of Onset  . Pneumonia Mother   . Heart attack Father   . Breast cancer Other 51       niece  . Breast cancer Other 52       niece  . Ovarian cancer Other  niece    Social History Social History   Tobacco Use  . Smoking status: Never Smoker  . Smokeless tobacco: Never Used  . Tobacco comment: only smoked 2 packs cigarettes total; husband quit in 1971  Substance Use Topics  . Alcohol use: Yes    Alcohol/week: 1.8 oz    Types: 3 Glasses of wine per week  . Drug use: No     Allergies   Patient has no known allergies.   Review of Systems Review of Systems  All other systems reviewed and are negative.    Physical Exam Updated Vital Signs BP (!) 169/66 (BP Location: Left Arm)   Pulse 84   Temp 98.8 F (37.1 C) (Oral)   SpO2 97%   Physical Exam  Constitutional: She is oriented to person, place, and time. She appears well-developed and well-nourished.  Non-toxic appearance. No distress.  HENT:  Head: Normocephalic and atraumatic.  Eyes: Pupils are equal, round, and reactive to light. Conjunctivae, EOM and lids are normal.  Neck: Normal range of motion. Neck supple. No tracheal deviation present. No thyroid mass present.  Cardiovascular: Normal rate, regular rhythm and normal heart sounds. Exam reveals no gallop.  No murmur heard. Pulmonary/Chest: Effort normal and breath sounds normal. No stridor. No respiratory distress. She has no decreased breath sounds. She has no wheezes. She has no rhonchi. She has no rales.  Abdominal: Soft. Normal appearance and bowel sounds are normal. She  exhibits no distension. There is no tenderness. There is no rigidity, no rebound, no guarding and no CVA tenderness.  Musculoskeletal: Normal range of motion. She exhibits no edema or tenderness.  Neurological: She is alert and oriented to person, place, and time. She has normal strength. No cranial nerve deficit or sensory deficit. GCS eye subscore is 4. GCS verbal subscore is 5. GCS motor subscore is 6.  Skin: Skin is warm and dry. No abrasion and no rash noted.  Psychiatric: She has a normal mood and affect. Her speech is normal and behavior is normal.  Nursing note and vitals reviewed.    ED Treatments / Results  Labs (all labs ordered are listed, but only abnormal results are displayed) Labs Reviewed  LIPASE, BLOOD  COMPREHENSIVE METABOLIC PANEL  CBC  URINALYSIS, ROUTINE W REFLEX MICROSCOPIC  I-STAT TROPONIN, ED    EKG None  Radiology No results found.  Procedures Procedures (including critical care time)  Medications Ordered in ED Medications  0.9 %  sodium chloride infusion (has no administration in time range)  ondansetron (ZOFRAN) injection 4 mg (has no administration in time range)     Initial Impression / Assessment and Plan / ED Course  I have reviewed the triage vital signs and the nursing notes.  Pertinent labs & imaging results that were available during my care of the patient were reviewed by me and considered in my medical decision making (see chart for details).     Patient without emesis here.  Abdominal exam is benign.  Blood pressure is controlled.  Urinalysis is pending.  Case sent to Dr. Roxanne Mins who will follow up on patient's UA  Final Clinical Impressions(s) / ED Diagnoses   Final diagnoses:  None    ED Discharge Orders    None       Lacretia Leigh, MD 08/16/17 Lynnell Catalan    Lacretia Leigh, MD 08/27/17 [phone]

## 2017-08-15 NOTE — Discharge Instructions (Signed)
Implantable loop recorder instruction sheet given

## 2017-08-16 LAB — URINALYSIS, ROUTINE W REFLEX MICROSCOPIC
Bacteria, UA: NONE SEEN
Bilirubin Urine: NEGATIVE
Glucose, UA: 50 mg/dL — AB
Hgb urine dipstick: NEGATIVE
Ketones, ur: NEGATIVE mg/dL
Leukocytes, UA: NEGATIVE
Nitrite: NEGATIVE
Protein, ur: 100 mg/dL — AB
Specific Gravity, Urine: 1.013 (ref 1.005–1.030)
pH: 7 (ref 5.0–8.0)

## 2017-08-16 NOTE — Discharge Instructions (Signed)
Return here if you develop fever, worsening vomiting, or any other problems.  Follow-up with your doctor on Monday if not better

## 2017-08-16 NOTE — ED Notes (Signed)
Sitting in recliner chair in room.  Refuses to get into the bed.  States I'm more comfortable in the chair.

## 2017-08-16 NOTE — ED Provider Notes (Signed)
84 year old female signed out to me pending urinalysis.  Urinalysis has come back showing small glucose and moderate protein, but otherwise unremarkable.  She is discharged with instructions to follow-up with PCP.  Results for orders placed or performed during the hospital encounter of 08/15/17  Lipase, blood  Result Value Ref Range   Lipase 45 11 - 51 U/L  Comprehensive metabolic panel  Result Value Ref Range   Sodium 143 135 - 145 mmol/L   Potassium 4.3 3.5 - 5.1 mmol/L   Chloride 107 101 - 111 mmol/L   CO2 25 22 - 32 mmol/L   Glucose, Bld 203 (H) 65 - 99 mg/dL   BUN 51 (H) 6 - 20 mg/dL   Creatinine, Ser 1.46 (H) 0.44 - 1.00 mg/dL   Calcium 8.6 (L) 8.9 - 10.3 mg/dL   Total Protein 6.7 6.5 - 8.1 g/dL   Albumin 3.6 3.5 - 5.0 g/dL   AST 18 15 - 41 U/L   ALT 13 (L) 14 - 54 U/L   Alkaline Phosphatase 90 38 - 126 U/L   Total Bilirubin 0.8 0.3 - 1.2 mg/dL   GFR calc non Af Amer 31 (L) >60 mL/min   GFR calc Af Amer 36 (L) >60 mL/min   Anion gap 11 5 - 15  CBC  Result Value Ref Range   WBC 9.6 4.0 - 10.5 K/uL   RBC 3.26 (L) 3.87 - 5.11 MIL/uL   Hemoglobin 10.2 (L) 12.0 - 15.0 g/dL   HCT 32.9 (L) 36.0 - 46.0 %   MCV 100.9 (H) 78.0 - 100.0 fL   MCH 31.3 26.0 - 34.0 pg   MCHC 31.0 30.0 - 36.0 g/dL   RDW 14.6 11.5 - 15.5 %   Platelets 207 150 - 400 K/uL  Urinalysis, Routine w reflex microscopic  Result Value Ref Range   Color, Urine STRAW (A) YELLOW   APPearance CLEAR CLEAR   Specific Gravity, Urine 1.013 1.005 - 1.030   pH 7.0 5.0 - 8.0   Glucose, UA 50 (A) NEGATIVE mg/dL   Hgb urine dipstick NEGATIVE NEGATIVE   Bilirubin Urine NEGATIVE NEGATIVE   Ketones, ur NEGATIVE NEGATIVE mg/dL   Protein, ur 100 (A) NEGATIVE mg/dL   Nitrite NEGATIVE NEGATIVE   Leukocytes, UA NEGATIVE NEGATIVE   RBC / HPF 0-5 0 - 5 RBC/hpf   WBC, UA 0-5 0 - 5 WBC/hpf   Bacteria, UA NONE SEEN NONE SEEN  I-stat troponin, ED  Result Value Ref Range   Troponin i, poc 0.01 0.00 - 0.08 ng/mL   Comment 3           I-Stat CG4 Lactic Acid, ED  Result Value Ref Range   Lactic Acid, Venous 1.17 0.5 - 1.9 mmol/L   Dg Chest 2 View  Result Date: 08/15/2017 CLINICAL DATA:  Emesis and hypertension EXAM: CHEST - 2 VIEW COMPARISON:  Chest x-rays dated 06/24/2017 and 06/01/2014. FINDINGS: Heart size and mediastinal contours are stable. Loop recorder now in place. Lungs are clear. No pleural effusion or pneumothorax seen. No acute or suspicious osseous finding. IMPRESSION: No active cardiopulmonary disease. No evidence of pneumonia or pulmonary edema. Electronically Signed   By: Franki Cabot M.D.   On: 29/47/6546 50:35      Delora Fuel, MD 46/56/81 [phone]

## 2017-08-16 NOTE — ED Notes (Signed)
Pt. Stated she has no way of getting into her house tonight and will call husband around 7am

## 2017-08-16 NOTE — ED Notes (Signed)
Pt stating she does not have a ride home. RN offered to call husband or neighbor, pt refused. Charge, RN notified.

## 2017-08-17 LAB — URINE CULTURE: Culture: NO GROWTH

## 2017-08-18 ENCOUNTER — Encounter (HOSPITAL_COMMUNITY): Payer: Self-pay | Admitting: Internal Medicine

## 2017-08-21 ENCOUNTER — Other Ambulatory Visit: Payer: Self-pay | Admitting: Oncology

## 2017-08-28 ENCOUNTER — Ambulatory Visit (INDEPENDENT_AMBULATORY_CARE_PROVIDER_SITE_OTHER): Payer: Medicare Other | Admitting: *Deleted

## 2017-08-28 DIAGNOSIS — I639 Cerebral infarction, unspecified: Secondary | ICD-10-CM

## 2017-08-28 LAB — CUP PACEART INCLINIC DEVICE CHECK
Date Time Interrogation Session: 20190627104630
Eval Rhythm: 63
Implantable Pulse Generator Implant Date: 20190614

## 2017-08-28 NOTE — Progress Notes (Signed)
Wound check appointment. Steri-strips removed, clot removed, no active bleeding. Incision edges unapproximated, Steri strips applied. No edema or redness. Patient states no fever or chills. Battery status: GOOD. R-waves 0.86 mV. 0 symptom episodes, 0 tachy episodes, 0 pause episodes, 0 brady episodes. 0 AF episodes (0% burden). Monthly summary reports and ROV with Device Clinic for wound recheck 09/03/17 with GT in office.

## 2017-09-01 DIAGNOSIS — M5416 Radiculopathy, lumbar region: Secondary | ICD-10-CM | POA: Diagnosis not present

## 2017-09-03 ENCOUNTER — Ambulatory Visit (INDEPENDENT_AMBULATORY_CARE_PROVIDER_SITE_OTHER): Payer: Medicare Other | Admitting: *Deleted

## 2017-09-03 DIAGNOSIS — I639 Cerebral infarction, unspecified: Secondary | ICD-10-CM

## 2017-09-03 NOTE — Progress Notes (Signed)
Sabrina [name] reports to the device clinic today for an evaluation of her LINQ implant incision. Steri-strips removed. Incision edges approximated with a small amount of scar tissue forming. No redness, swelling or drainage noted. Patient and family educated about signs and symptoms of infection and encouraged to call back if these are noted. Follow up as scheduled.

## 2017-09-05 ENCOUNTER — Other Ambulatory Visit: Payer: Self-pay | Admitting: Oncology

## 2017-09-17 ENCOUNTER — Ambulatory Visit (INDEPENDENT_AMBULATORY_CARE_PROVIDER_SITE_OTHER): Payer: Medicare Other | Admitting: *Deleted

## 2017-09-17 DIAGNOSIS — I639 Cerebral infarction, unspecified: Secondary | ICD-10-CM

## 2017-09-17 NOTE — Progress Notes (Signed)
[facility] Summary Report / Loop Recorder 

## 2017-09-18 DIAGNOSIS — M5416 Radiculopathy, lumbar region: Secondary | ICD-10-CM | POA: Diagnosis not present

## 2017-09-19 DIAGNOSIS — E1122 Type 2 diabetes mellitus with diabetic chronic kidney disease: Secondary | ICD-10-CM | POA: Diagnosis not present

## 2017-09-19 DIAGNOSIS — I129 Hypertensive chronic kidney disease with stage 1 through stage 4 chronic kidney disease, or unspecified chronic kidney disease: Secondary | ICD-10-CM | POA: Diagnosis not present

## 2017-09-19 DIAGNOSIS — N183 Chronic kidney disease, stage 3 (moderate): Secondary | ICD-10-CM | POA: Diagnosis not present

## 2017-09-19 DIAGNOSIS — M109 Gout, unspecified: Secondary | ICD-10-CM | POA: Diagnosis not present

## 2017-09-22 ENCOUNTER — Emergency Department (HOSPITAL_COMMUNITY)
Admission: EM | Admit: 2017-09-22 | Discharge: 2017-09-22 | Disposition: A | Discharge status: Home / Self Care | Payer: Medicare Other | Attending: Emergency Medicine | Admitting: Emergency Medicine

## 2017-09-22 ENCOUNTER — Encounter (HOSPITAL_COMMUNITY): Payer: Self-pay | Admitting: Emergency Medicine

## 2017-09-22 ENCOUNTER — Emergency Department (HOSPITAL_COMMUNITY): Payer: Medicare Other

## 2017-09-22 DIAGNOSIS — I129 Hypertensive chronic kidney disease with stage 1 through stage 4 chronic kidney disease, or unspecified chronic kidney disease: Secondary | ICD-10-CM | POA: Insufficient documentation

## 2017-09-22 DIAGNOSIS — Z79899 Other long term (current) drug therapy: Secondary | ICD-10-CM | POA: Insufficient documentation

## 2017-09-22 DIAGNOSIS — Y929 Unspecified place or not applicable: Secondary | ICD-10-CM | POA: Insufficient documentation

## 2017-09-22 DIAGNOSIS — S0990XA Unspecified injury of head, initial encounter: Secondary | ICD-10-CM | POA: Diagnosis not present

## 2017-09-22 DIAGNOSIS — N184 Chronic kidney disease, stage 4 (severe): Secondary | ICD-10-CM | POA: Diagnosis not present

## 2017-09-22 DIAGNOSIS — Y999 Unspecified external cause status: Secondary | ICD-10-CM | POA: Insufficient documentation

## 2017-09-22 DIAGNOSIS — Z7984 Long term (current) use of oral hypoglycemic drugs: Secondary | ICD-10-CM | POA: Diagnosis not present

## 2017-09-22 DIAGNOSIS — W06XXXA Fall from bed, initial encounter: Secondary | ICD-10-CM | POA: Insufficient documentation

## 2017-09-22 DIAGNOSIS — Y9389 Activity, other specified: Secondary | ICD-10-CM | POA: Diagnosis not present

## 2017-09-22 DIAGNOSIS — Z7982 Long term (current) use of aspirin: Secondary | ICD-10-CM | POA: Diagnosis not present

## 2017-09-22 DIAGNOSIS — S0003XA Contusion of scalp, initial encounter: Secondary | ICD-10-CM | POA: Insufficient documentation

## 2017-09-22 DIAGNOSIS — E1122 Type 2 diabetes mellitus with diabetic chronic kidney disease: Secondary | ICD-10-CM | POA: Diagnosis not present

## 2017-09-22 DIAGNOSIS — S098XXA Other specified injuries of head, initial encounter: Secondary | ICD-10-CM | POA: Diagnosis present

## 2017-09-22 NOTE — ED Provider Notes (Signed)
[facility] DEPT Provider Note   CSN: [id number] Arrival date & time: 09/22/17  0947     History   Chief Complaint Chief Complaint  Patient presents with  . Fall  . Head Injury    HPI Sabrina [name] is a 84 y.o. female.  84 year old female with multiple medical problems but not on any anticoagulation had a mechanical fall this morning.  She and her attendant were trying to help her get on her pants when she slid off the bed and landed on her butt and struck her head on a desk chair leg.  This occurred around 930 this morning, there was no loss of consciousness.  She has been able to ambulate from the house to the car.  She is complaining of some stinging pain to that area when it is palpated but otherwise does not endorse any pain.  Is been no change in her vision no numbness no tingling no chest pain no shortness of breath no weak there is no neck or back pain other than her baseline symptoms.  She is had a recent stroke and was actually supposed to go see the eye doctor today due to some ongoing visual symptoms.  Nothing is changed since the fall.  The history is provided by the patient, the spouse and a caregiver.  Fall  This is a new problem. The current episode started less than 1 hour ago. The problem occurs constantly. The problem has not changed since onset.Associated symptoms include headaches. Pertinent negatives include no chest pain, no abdominal pain and no shortness of breath. Exacerbated by: palpation. Nothing relieves the symptoms. She has tried nothing for the symptoms. The treatment provided no relief.  Head Injury   The incident occurred less than 1 hour ago. She came to the ER via walk-in. The injury mechanism was a fall. There was no loss of consciousness. There was no blood loss. Associated symptoms include blurred vision (baseline). Pertinent negatives include no numbness, no vomiting, no disorientation, no weakness and no memory loss. She  has tried nothing for the symptoms.    Past Medical History:  Diagnosis Date  . Arthritis    "mostly in my hands, lower back" (06/25/2017)  . Basal cell carcinoma (BCC) of face 1983  . Breast cancer, right breast ([place]) 03/2013  . Chronic kidney disease (CKD), stage III (moderate) (HCC)    nephrologist, Dr. [name]  . Dental crowns present   . DVT (deep venous thrombosis) ([place]) ~ 06/2013   "? side"  . Gout    "on daily RX" (06/25/2017)  . Heart murmur    no known problems; states did not know she had murmur until age 29  . High cholesterol   . Hypertension    fluctuates, especially when stressed; has been on med. > 20 yr.  . Immature cataract   . Non-insulin dependent type 2 diabetes mellitus ([place])   . Pulmonary embolism ([place]) ~ 06/2013  . Radiation 09/06/13-10/20/13   Right Breast Cancer  . Stroke ([place]) 05/2017   just visual problems since (06/25/2017)  . Wears partial dentures    lower    Patient Active Problem List   Diagnosis Date Noted  . Blurry vision, bilateral   . Acute blood loss anemia   . History of breast cancer 05/30/2017  . Weakness 05/30/2017  . Occipital infarction ([employer]) 05/30/2017  . Pressure injury of skin 05/28/2017  . Fall at home 05/27/2017  . CKD (chronic kidney disease), stage  IV ([place]) 05/27/2017  . High cholesterol 05/27/2017  . DM (diabetes mellitus) ([place]) 05/27/2017  . Hypertension 05/27/2017  . Gout 05/27/2017  . Elevated troponin 05/27/2017  . Lipoma of lower extremity 09/12/2014  . Abnormal x-ray 03/15/2014  . Chronic diastolic congestive heart failure ([place]) 02/23/2014  . Osteopenia 11/30/2013  . Hypoglycemia 09/13/2013  . Acute respiratory failure with hypoxia ([place]) 06/14/2013  . History of pulmonary embolism 06/10/2013  . Nausea and vomiting 06/10/2013  . Accelerated hypertension 06/10/2013  . Aortic stenosis 04/22/2013  . Edema leg 04/22/2013  . Breast cancer of upper-outer quadrant of right female breast ([place]) 03/19/2013     Past Surgical History:  Procedure Laterality Date  . AXILLARY LYMPH NODE DISSECTION Right 03/30/2013   Procedure: AXILLARY LYMPH NODE DISSECTION;  Surgeon: Rolm Bookbinder, MD;  Location: [facility];  Service: General;  Laterality: Right;  . BASAL CELL CARCINOMA EXCISION  1983   "face"  . BREAST BIOPSY Right 03/2013  . BREAST CYST EXCISION Right 11/1958   benign  . BREAST LUMPECTOMY WITH NEEDLE LOCALIZATION Right 03/30/2013   Procedure: BREAST LUMPECTOMY WITH NEEDLE LOCALIZATION;  Surgeon: Rolm Bookbinder, MD;  Location: [facility];  Service: General;  Laterality: Right;  . DILATION AND CURETTAGE OF UTERUS    . LOOP RECORDER INSERTION N/A 08/15/2017   Procedure: LOOP RECORDER INSERTION;  Surgeon: Evans Lance, MD;  Location: [facility];  Service: Cardiovascular;  Laterality: N/A;  . PORT-A-CATH REMOVAL  2016  . PORTACATH PLACEMENT N/A 04/15/2013   Procedure: INSERTION PORT-A-CATH;  Surgeon: Rolm Bookbinder, MD;  Location: [facility];  Service: General;  Laterality: N/A;  . RE-EXCISION OF BREAST CANCER,SUPERIOR MARGINS Right 04/15/2013   Procedure: RE-EXCISION OF RIGHT BREAST  MARGINS;  Surgeon: Rolm Bookbinder, MD;  Location: [facility];  Service: General;  Laterality: Right;  . TONSILLECTOMY  ~ 1935/1936     OB History   None    Obstetric Comments  Menarche ae 14 No children Married 29 years         Home Medications    Prior to Admission medications   Medication Sig Start Date End Date Taking? Authorizing Provider  allopurinol (ZYLOPRIM) 100 MG tablet Take 100 mg by mouth daily.  01/17/16   [provider]  anastrozole (ARIMIDEX) 1 MG tablet TAKE 1 TABLET ONCE DAILY. 09/05/17   Magrinat, Virgie Dad, MD  aspirin EC 325 MG EC tablet Take 1 tablet (325 mg total) by mouth daily. 06/04/17   Patrecia Pour, MD  atorvastatin (LIPITOR) 40 MG tablet Take 1 tablet (40 mg total) by mouth every evening. 06/04/17   Patrecia Pour, MD  Biotin (BIOTIN  5000) 5 MG CAPS Take 5,000 Units by mouth daily.     [provider]  cholecalciferol (VITAMIN D) 1000 UNITS tablet Take 1,000 Units by mouth daily.    [provider]  cloNIDine (CATAPRES) 0.2 MG tablet Take 0.2 mg by mouth 2 (two) times daily.    [provider]  denosumab (PROLIA) 60 MG/ML SOSY injection Inject 60 mg into the skin every 6 (six) months.    [provider]  docusate sodium (COLACE) 100 MG capsule Take 100 mg by mouth daily as needed for moderate constipation.     [provider]  ferrous sulfate 325 (65 FE) MG tablet Take 325 mg by mouth daily with breakfast.    [provider]  furosemide (LASIX) 40 MG tablet TAKE 40 mg  TABLET EVERY OTHER  DAY 06/04/17   Patrecia Pour, MD  gabapentin (NEURONTIN) 100 MG capsule Take 200 mg by mouth 3 (three) times daily.  05/19/17   [provider]  glipiZIDE (GLUCOTROL) 5 MG tablet Take 2.5 mg by mouth 2 (two) times daily before a meal.     [provider]  hydrocortisone cream 1 % Apply 1 application topically daily as needed for itching.    [provider]  polyethylene glycol (MIRALAX / GLYCOLAX) packet Take 17 g by mouth daily.     [provider]  senna (SENOKOT) 8.6 MG TABS tablet Take 1 tablet (8.6 mg total) by mouth daily. Patient not taking: Reported on 08/12/2017 06/28/17   Nita Sells, MD  traMADol (ULTRAM) 50 MG tablet Take 50 mg by mouth 2 (two) times daily as needed for moderate pain.     [provider]  verapamil (VERELAN PM) 180 MG 24 hr capsule Take 180 mg by mouth at bedtime.    [provider]  vitamin C (ASCORBIC ACID) 500 MG tablet Take 500 mg by mouth daily.    [provider]    Family History Family History  Problem Relation Age of Onset  . Pneumonia Mother   . Heart attack Father   . Breast cancer Other 33       niece  . Breast cancer Other 61       niece  . Ovarian cancer Other        niece      Social History Social History   Tobacco Use  . Smoking status: Never Smoker  . Smokeless tobacco: Never Used  . Tobacco comment: only smoked 2 packs cigarettes total; husband quit in 1971  Substance Use Topics  . Alcohol use: Yes    Alcohol/week: 1.8 oz    Types: 3 Glasses of wine per week  . Drug use: No     Allergies   Patient has no known allergies.   Review of Systems Review of Systems  Constitutional: Negative for fever.  HENT: Negative for sore throat.   Eyes: Positive for blurred vision (baseline) and visual disturbance. Negative for pain.  Respiratory: Negative for shortness of breath.   Cardiovascular: Negative for chest pain.  Gastrointestinal: Negative for abdominal pain and vomiting.  Genitourinary: Negative for dysuria.  Musculoskeletal: Positive for back pain (sciatica).  Skin: Negative for rash.  Neurological: Positive for headaches. Negative for weakness and numbness.  Psychiatric/Behavioral: Negative for memory loss.     Physical Exam Updated Vital Signs BP (!) 182/67 (BP Location: Left Arm)   Pulse 86   Temp 97.8 F (36.6 C) (Oral)   Resp 19   SpO2 96%   Physical Exam  Constitutional: She appears well-developed and well-nourished.  HENT:  Head: Normocephalic.  Right Ear: External ear normal.  Left Ear: External ear normal.  Nose: Nose normal.  Mouth/Throat: Oropharynx is clear and moist.  She is approximately 4 cm area of hematoma behind her left ear.  There is no bleeding.  No obvious skull depressions.  Eyes: Conjunctivae are normal.  Neck: Neck supple.  Cardiovascular: Normal rate and regular rhythm.  Pulmonary/Chest: Effort normal. No respiratory distress.  Abdominal: Soft. There is no tenderness. There is no guarding.  Musculoskeletal: Normal range of motion. She exhibits no deformity.  Neurological: She is alert. She has normal strength. No sensory deficit. GCS eye subscore is 4. GCS verbal subscore is 5. GCS motor subscore is  6.  Skin: Skin is warm and  dry.  Psychiatric: She has a normal mood and affect.  Nursing note and vitals reviewed.    ED Treatments / Results  Labs (all labs ordered are listed, but only abnormal results are displayed) Labs Reviewed - No data to display  EKG None  Radiology Ct Head Wo Contrast  Result Date: 09/22/2017 CLINICAL DATA:  Fall with head injury to left lateral head. EXAM: CT HEAD WITHOUT CONTRAST TECHNIQUE: Contiguous axial images were obtained from the base of the skull through the vertex without intravenous contrast. COMPARISON:  05/30/2017 head CT and MRI studies FINDINGS: Brain: Small vessel disease again noted in the periventricular white matter with component of old infarcts in both occipital lobes. The brain demonstrates no evidence of hemorrhage, acute infarction, edema, mass effect, extra-axial fluid collection, hydrocephalus or mass lesion. Vascular: No hyperdense vessel or unexpected calcification. Skull: Scalp hematoma overlies the left posterior parietal skull. No evidence of underlying skull fracture. Sinuses/Orbits: No acute finding. Other: None. IMPRESSION: Left posterior parietal scalp hematoma. No acute findings. Small vessel disease with old infarcts in both occipital lobes. Electronically Signed   By: Aletta Edouard M.D.   On: 09/22/2017 12:02    Procedures Procedures (including critical care time)  Medications Ordered in ED Medications - No data to display   Initial Impression / Assessment and Plan / ED Course  I have reviewed the triage vital signs and the nursing notes.  Pertinent labs & imaging results that were available during my care of the patient were reviewed by me and considered in my medical decision making (see chart for details).  Clinical Course as of Sep 23 1141  Mon Sep 23, 6614  1175 84 year old female not on anticoagulation here with what sounds like a mechanical fall.  She is got a hematoma on the left parietal area.  She is  agreeable to a head CT but I do not think she needs any other further testing.   [MB]    Clinical Course User Index [MB] Hayden Rasmussen, MD    Final Clinical Impressions(s) / ED Diagnoses   Final diagnoses:  Injury of head, initial encounter  Hematoma of scalp, initial encounter    ED Discharge Orders    None       Hayden Rasmussen, MD 09/23/17 1143

## 2017-09-22 NOTE — Discharge Instructions (Addendum)
Your evaluated in the emergency department for a fall in which she sustained a big bruise in the back of your head.  Skin that did not show any obvious fracture or bleeding in the brain.  You can use ice to the area and take Tylenol for pain.  Please return to the emergency department if any worsening symptoms.

## 2017-09-22 NOTE — ED Triage Notes (Signed)
Pt reports that she was getting dressed this morning and while trying to put on her pants she fell over and hit her left lateral head on dresser. Denies LOC or taking blood thinners.

## 2017-10-01 DIAGNOSIS — M5136 Other intervertebral disc degeneration, lumbar region: Secondary | ICD-10-CM | POA: Insufficient documentation

## 2017-10-01 DIAGNOSIS — M79604 Pain in right leg: Secondary | ICD-10-CM | POA: Diagnosis not present

## 2017-10-01 DIAGNOSIS — M5416 Radiculopathy, lumbar region: Secondary | ICD-10-CM | POA: Diagnosis not present

## 2017-10-15 DIAGNOSIS — M5416 Radiculopathy, lumbar region: Secondary | ICD-10-CM | POA: Diagnosis not present

## 2017-10-20 ENCOUNTER — Ambulatory Visit (INDEPENDENT_AMBULATORY_CARE_PROVIDER_SITE_OTHER): Payer: Medicare Other | Admitting: *Deleted

## 2017-10-20 DIAGNOSIS — I639 Cerebral infarction, unspecified: Secondary | ICD-10-CM

## 2017-10-21 DIAGNOSIS — R110 Nausea: Secondary | ICD-10-CM | POA: Diagnosis not present

## 2017-10-21 DIAGNOSIS — N184 Chronic kidney disease, stage 4 (severe): Secondary | ICD-10-CM | POA: Diagnosis not present

## 2017-10-21 DIAGNOSIS — E1121 Type 2 diabetes mellitus with diabetic nephropathy: Secondary | ICD-10-CM | POA: Diagnosis not present

## 2017-10-21 DIAGNOSIS — I129 Hypertensive chronic kidney disease with stage 1 through stage 4 chronic kidney disease, or unspecified chronic kidney disease: Secondary | ICD-10-CM | POA: Diagnosis not present

## 2017-10-21 NOTE — Progress Notes (Signed)
[facility] Summary Report / Loop Recorder 

## 2017-10-24 DIAGNOSIS — M5416 Radiculopathy, lumbar region: Secondary | ICD-10-CM | POA: Diagnosis not present

## 2017-10-28 DIAGNOSIS — H5203 Hypermetropia, bilateral: Secondary | ICD-10-CM | POA: Diagnosis not present

## 2017-10-28 DIAGNOSIS — H524 Presbyopia: Secondary | ICD-10-CM | POA: Diagnosis not present

## 2017-10-28 DIAGNOSIS — E119 Type 2 diabetes mellitus without complications: Secondary | ICD-10-CM | POA: Diagnosis not present

## 2017-10-28 DIAGNOSIS — Z7984 Long term (current) use of oral hypoglycemic drugs: Secondary | ICD-10-CM | POA: Diagnosis not present

## 2017-10-30 DIAGNOSIS — M5416 Radiculopathy, lumbar region: Secondary | ICD-10-CM | POA: Diagnosis not present

## 2017-11-04 LAB — CUP PACEART REMOTE DEVICE CHECK
Date Time Interrogation Session: 20190717133622
Implantable Pulse Generator Implant Date: 20190614

## 2017-11-06 DIAGNOSIS — M5416 Radiculopathy, lumbar region: Secondary | ICD-10-CM | POA: Diagnosis not present

## 2017-11-10 DIAGNOSIS — Z23 Encounter for immunization: Secondary | ICD-10-CM | POA: Diagnosis not present

## 2017-11-10 DIAGNOSIS — M5416 Radiculopathy, lumbar region: Secondary | ICD-10-CM | POA: Diagnosis not present

## 2017-11-10 DIAGNOSIS — H6123 Impacted cerumen, bilateral: Secondary | ICD-10-CM | POA: Diagnosis not present

## 2017-11-12 DIAGNOSIS — H2513 Age-related nuclear cataract, bilateral: Secondary | ICD-10-CM | POA: Diagnosis not present

## 2017-11-12 DIAGNOSIS — H25013 Cortical age-related cataract, bilateral: Secondary | ICD-10-CM | POA: Diagnosis not present

## 2017-11-13 DIAGNOSIS — M5136 Other intervertebral disc degeneration, lumbar region: Secondary | ICD-10-CM | POA: Diagnosis not present

## 2017-11-18 DIAGNOSIS — M5416 Radiculopathy, lumbar region: Secondary | ICD-10-CM | POA: Diagnosis not present

## 2017-11-24 ENCOUNTER — Ambulatory Visit (INDEPENDENT_AMBULATORY_CARE_PROVIDER_SITE_OTHER): Payer: Medicare Other | Admitting: *Deleted

## 2017-11-24 DIAGNOSIS — I639 Cerebral infarction, unspecified: Secondary | ICD-10-CM

## 2017-11-24 LAB — CUP PACEART REMOTE DEVICE CHECK
Date Time Interrogation Session: 20190819140635
Implantable Pulse Generator Implant Date: 20190614

## 2017-11-24 NOTE — Progress Notes (Signed)
[facility] Summary Report / Loop Recorder 

## 2017-11-25 DIAGNOSIS — M5416 Radiculopathy, lumbar region: Secondary | ICD-10-CM | POA: Diagnosis not present

## 2017-11-26 DIAGNOSIS — H5703 Miosis: Secondary | ICD-10-CM | POA: Diagnosis not present

## 2017-11-26 DIAGNOSIS — H21561 Pupillary abnormality, right eye: Secondary | ICD-10-CM | POA: Diagnosis not present

## 2017-11-26 DIAGNOSIS — H25011 Cortical age-related cataract, right eye: Secondary | ICD-10-CM | POA: Diagnosis not present

## 2017-11-26 DIAGNOSIS — H2511 Age-related nuclear cataract, right eye: Secondary | ICD-10-CM | POA: Diagnosis not present

## 2017-11-26 DIAGNOSIS — H2512 Age-related nuclear cataract, left eye: Secondary | ICD-10-CM | POA: Diagnosis not present

## 2017-11-26 DIAGNOSIS — H25012 Cortical age-related cataract, left eye: Secondary | ICD-10-CM | POA: Diagnosis not present

## 2017-11-28 ENCOUNTER — Other Ambulatory Visit: Payer: Self-pay | Admitting: Oncology

## 2017-12-01 ENCOUNTER — Telehealth: Payer: Self-pay | Admitting: *Deleted

## 2017-12-01 LAB — CUP PACEART REMOTE DEVICE CHECK
Date Time Interrogation Session: 20190921143831
Implantable Pulse Generator Implant Date: 20190614

## 2017-12-01 NOTE — Telephone Encounter (Signed)
Spoke with patient regarding [facility] alert for 1 pause episode and 5 brady episodes that transmitted over the weekend. Pause ECG appropriate, duration 5sec, occurred at 1353 on 11/28/17. No brady ECGs available. Patient denies any symptoms with episode, no recent presyncope/syncope, reports she may have been napping at the time of the episode. She requests that I call her back this afternoon to assist with manual transmission for review of brady episodes. Patient is appreciative of call.

## 2017-12-01 NOTE — Telephone Encounter (Signed)
Spoke with patient and caregiver. Assisted them with sending a manual transmission. Advised I will mail instructions to patient's home address.  Transmission received and reviewed. 5 brady episodes are from 11/28/17 between 1407-1424, median V rate 30s. Patient reports she takes naps almost daily from ~1400-1500. Advised that Dr. Lovena Le will review episodes and that I will make note that patient naps at that time. Patient and caregiver deny questions or concerns at this time.  Instructions mailed. Episode ECGs placed in Dr. Macon Large folder for review.

## 2017-12-09 DIAGNOSIS — M5416 Radiculopathy, lumbar region: Secondary | ICD-10-CM | POA: Diagnosis not present

## 2017-12-19 DIAGNOSIS — M5416 Radiculopathy, lumbar region: Secondary | ICD-10-CM | POA: Diagnosis not present

## 2017-12-22 DIAGNOSIS — M5416 Radiculopathy, lumbar region: Secondary | ICD-10-CM | POA: Diagnosis not present

## 2017-12-24 DIAGNOSIS — H2512 Age-related nuclear cataract, left eye: Secondary | ICD-10-CM | POA: Diagnosis not present

## 2017-12-24 DIAGNOSIS — H25012 Cortical age-related cataract, left eye: Secondary | ICD-10-CM | POA: Diagnosis not present

## 2017-12-24 DIAGNOSIS — H21562 Pupillary abnormality, left eye: Secondary | ICD-10-CM | POA: Diagnosis not present

## 2017-12-24 DIAGNOSIS — H5703 Miosis: Secondary | ICD-10-CM | POA: Diagnosis not present

## 2017-12-25 ENCOUNTER — Ambulatory Visit (INDEPENDENT_AMBULATORY_CARE_PROVIDER_SITE_OTHER): Payer: Medicare Other | Admitting: *Deleted

## 2017-12-25 DIAGNOSIS — I639 Cerebral infarction, unspecified: Secondary | ICD-10-CM

## 2017-12-25 NOTE — Progress Notes (Signed)
[facility] Summary Report / Loop Recorder 

## 2018-01-05 DIAGNOSIS — B029 Zoster without complications: Secondary | ICD-10-CM | POA: Diagnosis not present

## 2018-01-05 DIAGNOSIS — I129 Hypertensive chronic kidney disease with stage 1 through stage 4 chronic kidney disease, or unspecified chronic kidney disease: Secondary | ICD-10-CM | POA: Diagnosis not present

## 2018-01-05 DIAGNOSIS — N184 Chronic kidney disease, stage 4 (severe): Secondary | ICD-10-CM | POA: Diagnosis not present

## 2018-01-05 DIAGNOSIS — E1121 Type 2 diabetes mellitus with diabetic nephropathy: Secondary | ICD-10-CM | POA: Diagnosis not present

## 2018-01-06 DIAGNOSIS — B0231 Zoster conjunctivitis: Secondary | ICD-10-CM | POA: Diagnosis not present

## 2018-01-09 LAB — CUP PACEART REMOTE DEVICE CHECK
Date Time Interrogation Session: 20191024144131
Implantable Pulse Generator Implant Date: 20190614

## 2018-01-13 DIAGNOSIS — B0231 Zoster conjunctivitis: Secondary | ICD-10-CM | POA: Diagnosis not present

## 2018-01-20 DIAGNOSIS — Z961 Presence of intraocular lens: Secondary | ICD-10-CM | POA: Diagnosis not present

## 2018-01-20 DIAGNOSIS — B0231 Zoster conjunctivitis: Secondary | ICD-10-CM | POA: Diagnosis not present

## 2018-01-21 ENCOUNTER — Ambulatory Visit: Payer: Medicare Other | Admitting: Adult Health

## 2018-01-21 ENCOUNTER — Encounter: Payer: Self-pay | Admitting: Adult Health

## 2018-01-21 VITALS — BP 156/56 | HR 66 | Ht 59.0 in | Wt 165.0 lb

## 2018-01-21 DIAGNOSIS — E119 Type 2 diabetes mellitus without complications: Secondary | ICD-10-CM

## 2018-01-21 DIAGNOSIS — I10 Essential (primary) hypertension: Secondary | ICD-10-CM

## 2018-01-21 DIAGNOSIS — I639 Cerebral infarction, unspecified: Secondary | ICD-10-CM

## 2018-01-21 DIAGNOSIS — H543 Unqualified visual loss, both eyes: Secondary | ICD-10-CM | POA: Diagnosis not present

## 2018-01-21 DIAGNOSIS — B0223 Postherpetic polyneuropathy: Secondary | ICD-10-CM

## 2018-01-21 MED ORDER — GABAPENTIN 300 MG PO CAPS: 300.0000 mg | ORAL_CAPSULE | Freq: Every day | ORAL | 0 refills | Status: DC

## 2018-01-21 NOTE — Progress Notes (Signed)
I agree with the above plan 

## 2018-01-21 NOTE — Patient Instructions (Addendum)
Continue aspirin 325 mg daily  and lipitor  for secondary stroke prevention  Continue to follow up with PCP regarding cholesterol and blood pressure management   Take 300mg  gabapentin nightly in addition to your already prescribed dose. Sent in 1 month supply. If after 3 weeks you continue to experience pain, please contact your primary doctor for refill.   Continue to monitor loop recorder for atrial fibrillation  Continue to follow up with your eye doctor regarding eye vision loss  Continue to monitor blood pressure at home  Maintain strict control of hypertension with blood pressure goal below 130/90, diabetes with hemoglobin A1c goal below 6.5% and cholesterol with LDL cholesterol (bad cholesterol) goal below 70 mg/dL. I also advised the patient to eat a healthy diet with plenty of whole grains, cereals, fruits and vegetables, exercise regularly and maintain ideal body weight.  Followup in the future with me in 6 months or call earlier if needed       Thank you for coming to see [place] at [facility]. I Aniesa we have been able to provide you high quality care today.  You may receive a patient satisfaction survey over the next few weeks. We would appreciate your feedback and comments so that we may continue to improve ourselves and the health of our patients.

## 2018-01-21 NOTE — Progress Notes (Signed)
[facility]    Provider:  Dr [name] Referring Provider: [name], MD Primary Care Physician:  [name], MD  CC:  Bilateral occipital strokes Chief Complaint  Patient presents with  . Follow-up    6 month follow up. Husband and caregiver present. Treatment room. Patient mentioned that she has been having some headaches    Interval history 01/21/18: Patient is being seen today for six-month follow-up visit and is accompanied by her husband and caregiver.  She does continue to have visual loss but has recently worsened in her right eye due to herpes zoster conjunctivitis which was diagnosed on 12/31/2017.  She does continue to experience some nerve pain but has been slowly improving.  She is also underwent bilateral cataract surgery.  She does have follow-up appoint with ophthalmology in 2 weeks time.  She does occasionally take tramadol which was prescribed prior when she experiences increasing pain in her head but denies any improvement.  She continues to take gabapentin as well which was prescribed prior.  She did have loop recorder placed on 08/15/2017 and has not shown atrial fibrillation thus far.  Continues to take aspirin 325 mg without side effects of bleeding or bruising.  Continues to take atorvastatin 40 mg daily without side effects myalgias.  Blood pressure today mildly elevated for patient at 156/56 and patient states typical SBP 130-140.  No further concerns at this time.  Denies new or worsening stroke/TIA symptoms.   HPI initial visit 07/30/2017 AA:  Coryn L Rouse is a 84 y.o. female here as a referral from Dr. Felipa Eth for bilateral occipital strokes.  She has a past medical history of frequent falls, hypertension, hyperlipidemia, history of DVT and PE previously on Coumadin, breast cancer 4 years ago, CHF, CKD who presented to the emergency room in April of this year with complaints of chest pain after a ground-level fall and blurry vision.  MRI showed  bilateral occipital strokes likely embolic from unknown source.  She was compliant with her aspirin 81 mg and Lipitor 10 mg Regiment and she denied any other symptoms such as weakness.  She is here today with her husband and caretaker who provide information.  She is still having vision loss and vision changes, has not improved. Discussed this is likely due to occipital infarcts, ophthalmology may help with a low-vision program. No new weakness, no other new symptoms, she had a swallowing test.no significant coughing or aspiration. They have met with speech and swallow. She has not fallen since admission. She went to rehab after the hospital. She uses a walker and is compliant. She now has a PT coming in to her house to help her. She has a caretaker daily. Here with her caretaker and husband who also provide information. She needs a loop recorder placed, she missed her cardiology appointment bc she was in rehab at the time.. No headaches, no temporal pain, no jaw pain or fevers or other signs of temporal arteritis.   Reviewed notes, labs and personally reviewed imaging from outside physicians, which showed:  MRI brain 05/30/2017: IMPRESSION: 1. Acute bilateral occipital lobe infarcts. 2. Mild chronic small vessel ischemic disease.  Reviewed echocardiogram report which did not show thrombus or PFO with ejection fraction in the range of 55 to 60%.  Carotid Dopplers showed 1 to 39% stenosis bilaterally  LDL 39 Hemoglobin A1c 7.4   Review of Systems: Patient complains of symptoms per HPI as well as the following symptoms: Headache. Pertinent negatives and positives per HPI.  All others negative.   Social History   Socioeconomic History  . Marital status: Married    Spouse name: Not on file  . Number of children: 0  . Years of education: Not on file  . Highest education level: Bachelor's degree (e.g., BA, AB, BS)  Occupational History  . Not on file  Social Needs  . Financial resource  strain: Not on file  . Food insecurity:    Worry: Not on file    Inability: Not on file  . Transportation needs:    Medical: Not on file    Non-medical: Not on file  Tobacco Use  . Smoking status: Never Smoker  . Smokeless tobacco: Never Used  . Tobacco comment: only smoked 2 packs cigarettes total; husband quit in 1971  Substance and Sexual Activity  . Alcohol use: Yes    Alcohol/week: 3.0 standard drinks    Types: 3 Glasses of wine per week  . Drug use: No  . Sexual activity: Not Currently  Lifestyle  . Physical activity:    Days per week: Not on file    Minutes per session: Not on file  . Stress: Not on file  Relationships  . Social connections:    Talks on phone: Not on file    Gets together: Not on file    Attends religious service: Not on file    Active member of club or organization: Not on file    Attends meetings of clubs or organizations: Not on file    Relationship status: Not on file  . Intimate partner violence:    Fear of current or ex partner: Not on file    Emotionally abused: Not on file    Physically abused: Not on file    Forced sexual activity: Not on file  Other Topics Concern  . Not on file  Social History Narrative   Lives at home with her husband   Right handed   Caffeine: 1 coffee daily at most     Family History  Problem Relation Age of Onset  . Pneumonia Mother   . Heart attack Father   . Breast cancer Other 81       niece  . Breast cancer Other 29       niece  . Ovarian cancer Other        niece    Past Medical History:  Diagnosis Date  . Arthritis    "mostly in my hands, lower back" (06/25/2017)  . Basal cell carcinoma (BCC) of face 1983  . Breast cancer, right breast ([place]) 03/2013  . Chronic kidney disease (CKD), stage III (moderate) ([facility])    nephrologist, Dr. [name]  . Dental crowns present   . DVT (deep venous thrombosis) ([place]) ~ 06/2013   "? side"  . Gout    "on daily RX" (06/25/2017)  . Heart murmur    no  known problems; states did not know she had murmur until age 20  . High cholesterol   . Hypertension    fluctuates, especially when stressed; has been on med. > 20 yr.  . Immature cataract   . Non-insulin dependent type 2 diabetes mellitus ([place])   . Pulmonary embolism ([place]) ~ 06/2013  . Radiation 09/06/13-10/20/13   Right Breast Cancer  . Stroke ([place]) 05/2017   just visual problems since (06/25/2017)  . Wears partial dentures    lower    Past Surgical History:  Procedure Laterality Date  . AXILLARY LYMPH NODE DISSECTION Right  03/30/2013   Procedure: AXILLARY LYMPH NODE DISSECTION;  Surgeon: Rolm Bookbinder, MD;  Location: [place];  Service: General;  Laterality: Right;  . BASAL CELL CARCINOMA EXCISION  1983   "face"  . BREAST BIOPSY Right 03/2013  . BREAST CYST EXCISION Right 11/1958   benign  . BREAST LUMPECTOMY WITH NEEDLE LOCALIZATION Right 03/30/2013   Procedure: BREAST LUMPECTOMY WITH NEEDLE LOCALIZATION;  Surgeon: Rolm Bookbinder, MD;  Location: [place];  Service: General;  Laterality: Right;  . DILATION AND CURETTAGE OF UTERUS    . LOOP RECORDER INSERTION N/A 08/15/2017   Procedure: LOOP RECORDER INSERTION;  Surgeon: Evans Lance, MD;  Location: [facility];  Service: Cardiovascular;  Laterality: N/A;  . PORT-A-CATH REMOVAL  2016  . PORTACATH PLACEMENT N/A 04/15/2013   Procedure: INSERTION PORT-A-CATH;  Surgeon: Rolm Bookbinder, MD;  Location: [place] [facility];  Service: General;  Laterality: N/A;  . RE-EXCISION OF BREAST CANCER,SUPERIOR MARGINS Right 04/15/2013   Procedure: RE-EXCISION OF RIGHT BREAST  MARGINS;  Surgeon: Rolm Bookbinder, MD;  Location: [place];  Service: General;  Laterality: Right;  . TONSILLECTOMY  ~ 1935/1936    Current Outpatient Medications  Medication Sig Dispense Refill  . allopurinol (ZYLOPRIM) 100 MG tablet Take 100 mg by mouth daily.     [name] anastrozole (ARIMIDEX) 1 MG tablet TAKE 1 TABLET ONCE DAILY. 28 tablet 3    . aspirin EC 325 MG EC tablet Take 1 tablet (325 mg total) by mouth daily.    [name] atorvastatin (LIPITOR) 40 MG tablet Take 1 tablet (40 mg total) by mouth every evening.    . Biotin (BIOTIN 5000) 5 MG CAPS Take 5,000 Units by mouth daily.     . cholecalciferol (VITAMIN D) 1000 UNITS tablet Take 1,000 Units by mouth daily.    . cloNIDine (CATAPRES) 0.2 MG tablet Take 0.2 mg by mouth 2 (two) times daily.    [name] denosumab (PROLIA) 60 MG/ML SOSY injection Inject 60 mg into the skin every 6 (six) months.    . ferrous sulfate 325 (65 FE) MG tablet Take 325 mg by mouth daily with breakfast.    . furosemide (LASIX) 40 MG tablet TAKE 40 mg  TABLET EVERY OTHER DAY 60 tablet 0  . gabapentin (NEURONTIN) 100 MG capsule Take 200 mg by mouth 3 (three) times daily.   0  . glipiZIDE (GLUCOTROL) 5 MG tablet Take 5 mg by mouth 2 (two) times daily before a meal.     . hydrocortisone cream 1 % Apply 1 application topically daily as needed for itching.    . polyethylene glycol (MIRALAX / GLYCOLAX) packet Take 17 g by mouth daily.     . traMADol (ULTRAM) 50 MG tablet Take 50 mg by mouth 2 (two) times daily as needed for moderate pain.     . verapamil (VERELAN PM) 180 MG 24 hr capsule Take 180 mg by mouth at bedtime.    . vitamin C (ASCORBIC ACID) 500 MG tablet Take 500 mg by mouth daily.     No current facility-administered medications for this visit.     Allergies as of 01/21/2018  . (No Known Allergies)    Vitals: BP (!) 156/56   Pulse 66   Ht 4' 11"  (1.499 m)   Wt 165 lb (74.8 kg)   BMI 33.33 kg/m  Last Weight:  Wt Readings from Last 1 Encounters:  01/21/18 165 lb (74.8 kg)   Last Height:   Ht Readings from Last  1 Encounters:  01/21/18 4' 11"  (1.499 m)   Physical exam: Exam: Gen: NAD, conversant, pleasant elderly Caucasian female, well nourised, obese, well groomed                     CV: RRR, no MRG. No Carotid Bruits. + mild peripheral edema, warm, nontender Eyes: Conjunctivae clear without  exudates or hemorrhage Skin: Herpes zoster lesions present over right eye and throughout the top portion of head.  All crusted over without evidence of open lesions.  Neuro: Detailed Neurologic Exam  Speech:    Speech is normal; fluent and spontaneous  Cognition:    The patient is oriented to person, place, and time;     recent and remote memory intact;     language fluent;     normal attention, concentration, fund of knowledge Cranial Nerves:    The pupils are equal, round, and reactive to light.  Funduscopic exam deferred. Visual fields are full to finger confrontation. Extraocular movements are intact. Trigeminal sensation is intact and the muscles of mastication are normal. The face is symmetric. The palate elevates in the midline. Hearing intact. Voice is normal. Shoulder shrug is normal. The tongue has normal motion without fasciculations.   Coordination:    No dysmetria   Gait:    Shuffling with walker  Motor Observation:    No asymmetry, no atrophy, and no involuntary movements noted. Tone:    Normal muscle tone.    Posture:    Stooped slightlky    Strength:    Strength is V/V in the upper limbs, symmetric and equal in the lower extremities 4+/5 limited by arthritic pain     Sensation: intact to LT     Reflex Exam:  DTR's:    Deep tendon reflexes in the upper and lower extremities are symmetrical bilaterally.   Toes:    The toes are downgoing bilaterally.   Clonus:    Clonus is absent.       Assessment/Plan:   84 y.o. female here as a referral from Dr. Felipa Eth for bilateral occipital strokes.  She has a past medical history of frequent falls, hypertension, hyperlipidemia, history of DVT and PE previously on Coumadin, breast cancer 4 years ago, CHF, CKD who presented to the emergency room in April of this year with complaints of chest pain after a ground-level fall and blurry vision.  MRI showed bilateral occipital strokes likely embolic from unknown source.   Patient is being seen today for scheduled follow-up visit.  She was recently treated for herpes zoster conjunctivitis therefore has experienced slightly worsening vision in right eye but otherwise stable from stroke standpoint.   -Continue aspirin 325 mg and atorvastatin 40 mg daily for secondary stroke prevention -Due to herpes zoster nerve pain which is interfering with sleep, did prescribe patient 1 month supply of 300 mg capsules of gabapentin and instructed to take in addition to currently prescribed gabapentin dosage.  Advised her that after this time, she feels as though she needs to continue additional dosage, to contact PCP -Continue to monitor loop recorder for atrial fibrillation -f/u with ophthalmology as scheduled -f/u with PCP regarding HTN, HLD and DM management Maintain strict control of hypertension with blood pressure goal below 130/90, diabetes with hemoglobin A1c goal below 6.5% and cholesterol with LDL cholesterol (bad cholesterol) goal below 70 mg/dL. I also advised the patient to eat a healthy diet with plenty of whole grains, cereals, fruits and vegetables, exercise regularly and maintain ideal body  weight.  Followup in the future with me in 6 months or call earlier if needed  Greater than 50% of time during this 25 minute visit was spent on counseling,explanation of diagnosis of bilateral occipital infarcts, reviewing risk factor management of visual loss, HLD, HTN and DM, planning of further management, discussion with patient and family and coordination of care  Venancio Poisson, AGNP-BC  [facility] [address] [place], [place] [address]  Phone [phone] Fax [phone] Note: This document was prepared with digital dictation and possible smart phrase technology. Any transcriptional errors that result from this process are unintentional.

## 2018-01-27 ENCOUNTER — Ambulatory Visit (INDEPENDENT_AMBULATORY_CARE_PROVIDER_SITE_OTHER): Payer: Medicare Other

## 2018-01-27 DIAGNOSIS — I639 Cerebral infarction, unspecified: Secondary | ICD-10-CM | POA: Diagnosis not present

## 2018-01-27 NOTE — Progress Notes (Signed)
[facility] Summary Report / Loop Recorder 

## 2018-01-28 DIAGNOSIS — M5416 Radiculopathy, lumbar region: Secondary | ICD-10-CM | POA: Diagnosis not present

## 2018-02-02 DIAGNOSIS — M5416 Radiculopathy, lumbar region: Secondary | ICD-10-CM | POA: Diagnosis not present

## 2018-02-05 ENCOUNTER — Other Ambulatory Visit: Payer: Self-pay | Admitting: *Deleted

## 2018-02-05 DIAGNOSIS — C50411 Malignant neoplasm of upper-outer quadrant of right female breast: Secondary | ICD-10-CM

## 2018-02-06 ENCOUNTER — Inpatient Hospital Stay: Payer: Medicare Other

## 2018-02-06 ENCOUNTER — Inpatient Hospital Stay: Payer: Medicare Other | Attending: Oncology

## 2018-02-06 VITALS — BP 182/60 | HR 74 | Temp 97.5°F | Resp 17

## 2018-02-06 DIAGNOSIS — M8580 Other specified disorders of bone density and structure, unspecified site: Secondary | ICD-10-CM

## 2018-02-06 DIAGNOSIS — C50411 Malignant neoplasm of upper-outer quadrant of right female breast: Secondary | ICD-10-CM | POA: Insufficient documentation

## 2018-02-06 DIAGNOSIS — Z79811 Long term (current) use of aromatase inhibitors: Secondary | ICD-10-CM | POA: Insufficient documentation

## 2018-02-06 DIAGNOSIS — Z170 Estrogen receptor positive status [ER+]: Secondary | ICD-10-CM | POA: Insufficient documentation

## 2018-02-06 LAB — CBC WITH DIFFERENTIAL (CANCER CENTER ONLY)
Abs Immature Granulocytes: 0.01 10*3/uL (ref 0.00–0.07)
Basophils Absolute: 0 10*3/uL (ref 0.0–0.1)
Basophils Relative: 1 %
Eosinophils Absolute: 0.3 10*3/uL (ref 0.0–0.5)
Eosinophils Relative: 4 %
HCT: 33.2 % — ABNORMAL LOW (ref 36.0–46.0)
Hemoglobin: 10.5 g/dL — ABNORMAL LOW (ref 12.0–15.0)
Immature Granulocytes: 0 %
Lymphocytes Relative: 29 %
Lymphs Abs: 1.9 10*3/uL (ref 0.7–4.0)
MCH: 32.2 pg (ref 26.0–34.0)
MCHC: 31.6 g/dL (ref 30.0–36.0)
MCV: 101.8 fL — ABNORMAL HIGH (ref 80.0–100.0)
Monocytes Absolute: 0.4 10*3/uL (ref 0.1–1.0)
Monocytes Relative: 6 %
Neutro Abs: 3.9 10*3/uL (ref 1.7–7.7)
Neutrophils Relative %: 60 %
Platelet Count: 184 10*3/uL (ref 150–400)
RBC: 3.26 MIL/uL — ABNORMAL LOW (ref 3.87–5.11)
RDW: 14.8 % (ref 11.5–15.5)
WBC Count: 6.5 10*3/uL (ref 4.0–10.5)
nRBC: 0 % (ref 0.0–0.2)

## 2018-02-06 LAB — CMP (CANCER CENTER ONLY)
ALT: 9 U/L (ref 0–44)
AST: 11 U/L — ABNORMAL LOW (ref 15–41)
Albumin: 3.6 g/dL (ref 3.5–5.0)
Alkaline Phosphatase: 102 U/L (ref 38–126)
Anion gap: 12 (ref 5–15)
BUN: 57 mg/dL — ABNORMAL HIGH (ref 8–23)
CO2: 27 mmol/L (ref 22–32)
Calcium: 9.7 mg/dL (ref 8.9–10.3)
Chloride: 107 mmol/L (ref 98–111)
Creatinine: 1.79 mg/dL — ABNORMAL HIGH (ref 0.44–1.00)
GFR, Est AFR Am: 29 mL/min — ABNORMAL LOW (ref >60)
GFR, Estimated: 25 mL/min — ABNORMAL LOW (ref >60)
Glucose, Bld: 225 mg/dL — ABNORMAL HIGH (ref 70–99)
Potassium: 4.4 mmol/L (ref 3.5–5.1)
Sodium: 146 mmol/L — ABNORMAL HIGH (ref 135–145)
Total Bilirubin: 0.9 mg/dL (ref 0.3–1.2)
Total Protein: 6.8 g/dL (ref 6.5–8.1)

## 2018-02-06 MED ORDER — DENOSUMAB 60 MG/ML ~~LOC~~ SOSY
PREFILLED_SYRINGE | SUBCUTANEOUS | Status: AC
  Filled 2018-02-06: qty 1

## 2018-02-06 MED ORDER — DENOSUMAB 60 MG/ML ~~LOC~~ SOSY
60.0000 mg | PREFILLED_SYRINGE | Freq: Once | SUBCUTANEOUS | Status: AC
  Administered 2018-02-06: 60 mg via SUBCUTANEOUS

## 2018-02-06 NOTE — Patient Instructions (Signed)

## 2018-02-09 DIAGNOSIS — Z961 Presence of intraocular lens: Secondary | ICD-10-CM | POA: Diagnosis not present

## 2018-02-16 DIAGNOSIS — M5416 Radiculopathy, lumbar region: Secondary | ICD-10-CM | POA: Diagnosis not present

## 2018-02-23 ENCOUNTER — Other Ambulatory Visit: Payer: Self-pay | Admitting: Geriatric Medicine

## 2018-02-23 DIAGNOSIS — Z853 Personal history of malignant neoplasm of breast: Secondary | ICD-10-CM

## 2018-02-23 DIAGNOSIS — M5416 Radiculopathy, lumbar region: Secondary | ICD-10-CM | POA: Diagnosis not present

## 2018-03-02 ENCOUNTER — Ambulatory Visit (INDEPENDENT_AMBULATORY_CARE_PROVIDER_SITE_OTHER): Payer: Medicare Other

## 2018-03-02 DIAGNOSIS — I639 Cerebral infarction, unspecified: Secondary | ICD-10-CM

## 2018-03-02 NOTE — Progress Notes (Signed)
[facility] Summary Report / Loop Recorder 

## 2018-03-03 LAB — CUP PACEART REMOTE DEVICE CHECK
Date Time Interrogation Session: 20191229163535
Implantable Pulse Generator Implant Date: 20190614

## 2018-03-05 ENCOUNTER — Other Ambulatory Visit: Payer: Self-pay | Admitting: Internal Medicine

## 2018-03-06 DIAGNOSIS — M5416 Radiculopathy, lumbar region: Secondary | ICD-10-CM | POA: Diagnosis not present

## 2018-03-11 DIAGNOSIS — M5416 Radiculopathy, lumbar region: Secondary | ICD-10-CM | POA: Diagnosis not present

## 2018-03-15 LAB — CUP PACEART REMOTE DEVICE CHECK
Date Time Interrogation Session: 20191126154034
Implantable Pulse Generator Implant Date: 20190614

## 2018-03-18 ENCOUNTER — Other Ambulatory Visit: Payer: Self-pay | Admitting: Oncology

## 2018-03-18 DIAGNOSIS — M5416 Radiculopathy, lumbar region: Secondary | ICD-10-CM | POA: Diagnosis not present

## 2018-03-25 DIAGNOSIS — M5416 Radiculopathy, lumbar region: Secondary | ICD-10-CM | POA: Diagnosis not present

## 2018-04-01 DIAGNOSIS — M5416 Radiculopathy, lumbar region: Secondary | ICD-10-CM | POA: Diagnosis not present

## 2018-04-02 ENCOUNTER — Ambulatory Visit
Admission: RE | Admit: 2018-04-02 | Discharge: 2018-04-02 | Disposition: A | Discharge status: Home / Self Care | Payer: Medicare Other | Source: Ambulatory Visit | Attending: Geriatric Medicine | Admitting: Geriatric Medicine

## 2018-04-02 DIAGNOSIS — Z853 Personal history of malignant neoplasm of breast: Secondary | ICD-10-CM | POA: Diagnosis not present

## 2018-04-02 DIAGNOSIS — R928 Other abnormal and inconclusive findings on diagnostic imaging of breast: Secondary | ICD-10-CM | POA: Diagnosis not present

## 2018-04-02 HISTORY — DX: Personal history of antineoplastic chemotherapy: Z92.21

## 2018-04-02 HISTORY — DX: Personal history of irradiation: Z92.3

## 2018-04-03 ENCOUNTER — Ambulatory Visit (INDEPENDENT_AMBULATORY_CARE_PROVIDER_SITE_OTHER): Payer: Medicare Other

## 2018-04-03 DIAGNOSIS — I639 Cerebral infarction, unspecified: Secondary | ICD-10-CM

## 2018-04-03 LAB — CUP PACEART REMOTE DEVICE CHECK
Date Time Interrogation Session: 20200131174002
Implantable Pulse Generator Implant Date: 20190614

## 2018-04-08 DIAGNOSIS — M5416 Radiculopathy, lumbar region: Secondary | ICD-10-CM | POA: Diagnosis not present

## 2018-04-10 NOTE — Progress Notes (Signed)
[facility] Summary Report / Loop Recorder 

## 2018-04-20 ENCOUNTER — Other Ambulatory Visit: Payer: Self-pay | Admitting: Oncology

## 2018-04-21 ENCOUNTER — Telehealth: Payer: Self-pay

## 2018-04-21 NOTE — Telephone Encounter (Signed)
Spoke with patient and she agreed to send a manual transmission

## 2018-04-22 NOTE — Telephone Encounter (Signed)
Spoke with patient and helped her send a manual transmission.

## 2018-04-24 NOTE — Telephone Encounter (Signed)
Spoke w/ pt and instructed her how to send a manual transmission w/ the home monitor. Transmission received. I informed pt that the Device Tech RN will review and will call back if anything is abnormal. Otherwise, no news is good news. Pt verbalized understanding.

## 2018-04-24 NOTE — Telephone Encounter (Signed)
Transmission received and reviewed. 6 brady episodes and 1 pause episode, duration 6 sec, noted on LINQ. All episodes occurred between 14:58-16:32 on 2/15 and 2/20.   Spoke with patient. She denies any dizziness or syncope. She reports she sometimes falls asleep in her chair in the afternoon. Advised patient I will review episodes with Dr. Lovena Le and call her back if any recommendations. She verbalizes agreement with plan.

## 2018-04-24 NOTE — Telephone Encounter (Signed)
Reviewed with Dr. Lovena Le. No changes as patient asymptomatic and possibly napping during episode. Continue to monitor for symptomatic episodes.  Spoke with patient. Updated her regarding plan. She agrees to call the DC or seek emergency medical attention for presyncopal/syncopal episodes. Patient denies questions or concerns at this time and thanked me for my call.

## 2018-04-27 DIAGNOSIS — E1121 Type 2 diabetes mellitus with diabetic nephropathy: Secondary | ICD-10-CM | POA: Diagnosis not present

## 2018-04-27 DIAGNOSIS — Z0000 Encounter for general adult medical examination without abnormal findings: Secondary | ICD-10-CM | POA: Diagnosis not present

## 2018-04-27 DIAGNOSIS — N184 Chronic kidney disease, stage 4 (severe): Secondary | ICD-10-CM | POA: Diagnosis not present

## 2018-04-27 DIAGNOSIS — I129 Hypertensive chronic kidney disease with stage 1 through stage 4 chronic kidney disease, or unspecified chronic kidney disease: Secondary | ICD-10-CM | POA: Diagnosis not present

## 2018-04-27 DIAGNOSIS — Z1389 Encounter for screening for other disorder: Secondary | ICD-10-CM | POA: Diagnosis not present

## 2018-05-05 DIAGNOSIS — Z961 Presence of intraocular lens: Secondary | ICD-10-CM | POA: Diagnosis not present

## 2018-05-06 ENCOUNTER — Ambulatory Visit (INDEPENDENT_AMBULATORY_CARE_PROVIDER_SITE_OTHER): Payer: Medicare Other | Admitting: *Deleted

## 2018-05-06 DIAGNOSIS — I639 Cerebral infarction, unspecified: Secondary | ICD-10-CM

## 2018-05-07 LAB — CUP PACEART REMOTE DEVICE CHECK
Date Time Interrogation Session: 20200304173920
Implantable Pulse Generator Implant Date: 20190614

## 2018-05-13 DIAGNOSIS — N184 Chronic kidney disease, stage 4 (severe): Secondary | ICD-10-CM | POA: Diagnosis not present

## 2018-05-13 DIAGNOSIS — B372 Candidiasis of skin and nail: Secondary | ICD-10-CM | POA: Diagnosis not present

## 2018-05-14 NOTE — Progress Notes (Signed)
[facility] Summary Report / Loop Recorder 

## 2018-05-18 ENCOUNTER — Other Ambulatory Visit: Payer: Self-pay | Admitting: Oncology

## 2018-05-19 ENCOUNTER — Telehealth: Payer: Self-pay

## 2018-05-19 ENCOUNTER — Other Ambulatory Visit: Payer: Self-pay | Admitting: Internal Medicine

## 2018-05-19 NOTE — Telephone Encounter (Signed)
Spoke with patient daughter. She will send tomorrow.

## 2018-05-25 ENCOUNTER — Telehealth: Payer: Self-pay | Admitting: Cardiology

## 2018-05-25 NOTE — Telephone Encounter (Signed)
Spoke w/ pt and requested that she send a manual transmission. She requested that we call back Tuesday 05/26/2018 between 10 AM - 2 PM when a caregiver is there to help her.

## 2018-05-27 NOTE — Telephone Encounter (Signed)
Spoke w/ pt and her caregiver and instructed her how to send a manual transmissionw/ the home monitor. Transmission received.

## 2018-05-28 NOTE — Telephone Encounter (Signed)
Pt with known bradycardia and pauses while sleeping/napping. She is aware to call with symptoms. Will turn alerts off.  Chanetta Marshall, NP 05/28/2018 1:57 PM

## 2018-06-08 ENCOUNTER — Other Ambulatory Visit: Payer: Self-pay

## 2018-06-08 ENCOUNTER — Ambulatory Visit (INDEPENDENT_AMBULATORY_CARE_PROVIDER_SITE_OTHER): Payer: Medicare Other | Admitting: *Deleted

## 2018-06-08 DIAGNOSIS — I639 Cerebral infarction, unspecified: Secondary | ICD-10-CM

## 2018-06-08 LAB — CUP PACEART REMOTE DEVICE CHECK
Date Time Interrogation Session: 20200406162804
Implantable Pulse Generator Implant Date: 20190614

## 2018-06-13 ENCOUNTER — Other Ambulatory Visit: Payer: Self-pay | Admitting: Oncology

## 2018-06-16 NOTE — Progress Notes (Signed)
[facility] Summary Report / Loop Recorder 

## 2018-06-29 DIAGNOSIS — E1121 Type 2 diabetes mellitus with diabetic nephropathy: Secondary | ICD-10-CM | POA: Diagnosis not present

## 2018-06-29 DIAGNOSIS — N184 Chronic kidney disease, stage 4 (severe): Secondary | ICD-10-CM | POA: Diagnosis not present

## 2018-06-29 DIAGNOSIS — I129 Hypertensive chronic kidney disease with stage 1 through stage 4 chronic kidney disease, or unspecified chronic kidney disease: Secondary | ICD-10-CM | POA: Diagnosis not present

## 2018-07-11 ENCOUNTER — Other Ambulatory Visit: Payer: Self-pay | Admitting: Oncology

## 2018-07-13 ENCOUNTER — Other Ambulatory Visit: Payer: Self-pay

## 2018-07-13 ENCOUNTER — Ambulatory Visit (INDEPENDENT_AMBULATORY_CARE_PROVIDER_SITE_OTHER): Payer: Medicare Other | Admitting: *Deleted

## 2018-07-13 DIAGNOSIS — I639 Cerebral infarction, unspecified: Secondary | ICD-10-CM

## 2018-07-13 LAB — CUP PACEART REMOTE DEVICE CHECK
Date Time Interrogation Session: 20200509201007
Implantable Pulse Generator Implant Date: 20190614

## 2018-07-16 DIAGNOSIS — I872 Venous insufficiency (chronic) (peripheral): Secondary | ICD-10-CM | POA: Diagnosis not present

## 2018-07-16 DIAGNOSIS — I129 Hypertensive chronic kidney disease with stage 1 through stage 4 chronic kidney disease, or unspecified chronic kidney disease: Secondary | ICD-10-CM | POA: Diagnosis not present

## 2018-07-16 DIAGNOSIS — N184 Chronic kidney disease, stage 4 (severe): Secondary | ICD-10-CM | POA: Diagnosis not present

## 2018-07-21 NOTE — Progress Notes (Signed)
[facility] Summary Report / Loop Recorder 

## 2018-07-22 ENCOUNTER — Telehealth: Payer: Self-pay

## 2018-07-22 NOTE — Telephone Encounter (Signed)
I called pt to state visit will be video due to COVID 19. Pt stated she has a cell phone and gave verbal consent to do video and to file insurance. Her cell number is [phone] [id number] and carrier is [employer]. I spoke with [name] pts caregiver and she will help pt. I text pt the link and [name](caregiver confirmed they receive the link. I advise to click link 10 minutes prior. I updated chart with PCP, meds and pharmacy.

## 2018-07-23 ENCOUNTER — Encounter: Payer: Self-pay | Admitting: Adult Health

## 2018-07-23 ENCOUNTER — Ambulatory Visit (INDEPENDENT_AMBULATORY_CARE_PROVIDER_SITE_OTHER): Payer: Medicare Other | Admitting: Adult Health

## 2018-07-23 ENCOUNTER — Other Ambulatory Visit: Payer: Self-pay

## 2018-07-23 DIAGNOSIS — H538 Other visual disturbances: Secondary | ICD-10-CM

## 2018-07-23 DIAGNOSIS — N184 Chronic kidney disease, stage 4 (severe): Secondary | ICD-10-CM | POA: Diagnosis not present

## 2018-07-23 DIAGNOSIS — I639 Cerebral infarction, unspecified: Secondary | ICD-10-CM | POA: Diagnosis not present

## 2018-07-23 DIAGNOSIS — I129 Hypertensive chronic kidney disease with stage 1 through stage 4 chronic kidney disease, or unspecified chronic kidney disease: Secondary | ICD-10-CM | POA: Diagnosis not present

## 2018-07-23 DIAGNOSIS — E119 Type 2 diabetes mellitus without complications: Secondary | ICD-10-CM | POA: Diagnosis not present

## 2018-07-23 DIAGNOSIS — I10 Essential (primary) hypertension: Secondary | ICD-10-CM

## 2018-07-23 DIAGNOSIS — I872 Venous insufficiency (chronic) (peripheral): Secondary | ICD-10-CM | POA: Diagnosis not present

## 2018-07-23 NOTE — Progress Notes (Signed)
[facility] [address] [place]. [place] [address] [phone]   Reason for visit: Stroke follow-up  Virtual Visit via Telephone Note  I connected with Sabrina [name] on 07/23/18 at 10:45 AM EDT by telephone located at [facility] and verified that I am speaking with the correct person using two identifiers who reports being located within her own home.  She was initially scheduled for telemedicine visit via [email or web address] but unfortunately was having Internet issues and therefore transition to telephone visit.   Visit scheduled by Lemont Fillers, RN. She discussed the limitations, risks, security and privacy concerns of performing an evaluation and management service by telephone and the availability of in person appointments. I also discussed with the patient that there may be a patient responsible charge related to this service. The patient expressed understanding and agreed to proceed. See telephone note for consent and additional scheduling information.    History of Present Illness:  Sabrina [name] is a 85 y.o. female who has been followed in this office for bilateral occipital strokes with initial evaluation by Dr [name] 07/30/2017. She was initially scheduled for face-to-face office follow up visit today time but due to [place], visit rescheduled for non-face-to-face telephone visit with patients consent. Unable to participate in video visit due to lack of access to device with camera.    HPI   Sabrina [name] is a 85 y.o. female here as a referral from Dr. Felipa Eth for bilateral occipital strokes and initially evaluated by Dr. [name] on 07/30/2017.  She initially had office visit follow-up scheduled today but due to COVID-19 safety precautions, visit transitioned to telemedicine via [email or web address] with patient's consent.   She has a past medical history of frequent falls, hypertension, hyperlipidemia, history of DVT and PE previously on Coumadin,  breast cancer 4 years ago, CHF, CKD who presented to the emergency with complaints of chest pain after a ground-level fall and blurry vision.  MRI showed bilateral occipital strokes likely embolic from unknown source.  She was compliant with her aspirin 81 mg and Lipitor 10 mg Regiment and she denied any other symptoms such as weakness.  Loop recorder placed to assess for atrial fibrillation.  She has been stable from a stroke standpoint without residual deficits or reoccurring symptoms.  Vision has been stable since bilateral cataract surgery and continues to follow with ophthalmology regularly.  Loop recorder has not shown atrial fibrillation thus far.  Continues on aspirin 325 mg and atorvastatin without reported side effects.  Blood pressure stable.  Continues to use rolling walker and denies any recent falls.  No further concerns at this time.  Denies new or worsening stroke/TIA symptoms.    Observations/Objective:  General: Pleasant elderly female asking and answering questions appropriately throughout conversation Mental status: Alert and oriented x3.  Fund of knowledge appropriate.  Pleasant and cooperative.  Loop recorder report 07/11/2018 [facility] summary report received. Battery status OK. Normal device function. No new symptom episodes. Since 06/08/18 1 pause event 07/10/18 0552, EGM shows SR w/ 4 second pause. No tachy episodes. 85 brady events, most recent all nocturnal with rates  30-35bpm. Patient with known nocturnal brady/pause events. No new AF episodes. Monthly summary reports and ROV/PRN  Assessment and Plan:  Assessment/Plan:   85 y.o. female here as a referral from Dr. Felipa Eth for bilateral occipital strokes.  She has a past medical history of frequent falls, hypertension, hyperlipidemia, history of DVT and PE previously on Coumadin, breast cancer 4 years ago, CHF, CKD  who presented to the emergency room in April of this year with complaints of chest pain after a ground-level  fall and blurry vision.  MRI showed bilateral occipital strokes likely embolic from unknown source.  She has been stable from a stroke standpoint.   -Continue aspirin 325 mg and atorvastatin 40 mg daily for secondary stroke prevention -Continue to monitor loop recorder for atrial fibrillation -f/u with ophthalmology as scheduled -f/u with PCP regarding HTN, HLD and DM management Maintain strict control of hypertension with blood pressure goal below 130/90, diabetes with hemoglobin A1c goal below 6.5% and cholesterol with LDL cholesterol (bad cholesterol) goal below 70 mg/dL. I also advised the patient to eat a healthy diet with plenty of whole grains, cereals, fruits and vegetables, exercise regularly and maintain ideal body weight.    Follow Up Instructions:  Stable from stroke standpoint and recommend follow-up as needed     I discussed the assessment and treatment plan with the patient.  The patient was provided an opportunity to ask questions and all were answered to their satisfaction. The patient agreed with the plan and verbalized an understanding of the instructions.   I provided 26 minutes of non-face-to-face time during this encounter.    Venancio Poisson, AGNP-BC  [facility] [address] [place], [place] [address]  Phone [phone] Fax [phone] Note: This document was prepared with digital dictation and possible smart phrase technology. Any transcriptional errors that result from this process are unintentional.

## 2018-07-24 NOTE — Progress Notes (Signed)
I agree with the above plan 

## 2018-07-29 ENCOUNTER — Telehealth: Payer: Self-pay | Admitting: Oncology

## 2018-07-29 NOTE — Telephone Encounter (Signed)
Changed 6/5 appt to webex per sch msg. Called and spoke with patient. Confirmed changes

## 2018-08-04 DIAGNOSIS — I129 Hypertensive chronic kidney disease with stage 1 through stage 4 chronic kidney disease, or unspecified chronic kidney disease: Secondary | ICD-10-CM | POA: Diagnosis not present

## 2018-08-04 DIAGNOSIS — E1121 Type 2 diabetes mellitus with diabetic nephropathy: Secondary | ICD-10-CM | POA: Diagnosis not present

## 2018-08-04 DIAGNOSIS — N184 Chronic kidney disease, stage 4 (severe): Secondary | ICD-10-CM | POA: Diagnosis not present

## 2018-08-04 DIAGNOSIS — R110 Nausea: Secondary | ICD-10-CM | POA: Diagnosis not present

## 2018-08-05 ENCOUNTER — Telehealth: Payer: Self-pay | Admitting: Oncology

## 2018-08-05 NOTE — Telephone Encounter (Signed)
Called patient regarding upcoming Webex appointment, per patient's request this needs to be a telephone visit.

## 2018-08-06 ENCOUNTER — Telehealth: Payer: Self-pay | Admitting: Oncology

## 2018-08-06 NOTE — Progress Notes (Signed)
Sabrina [name]  [phone] [phone]   ID: Sabrina [name] DOB: 18-Jun-1926  MR#: [id number]  QVZ#:[id number]  Patient Care Team: [name], MD as PCP - General (Internal Medicine) Marcy Panning, MD as Consulting Physician (Oncology) Reagan Klemz, Virgie Dad, MD as Consulting Physician (Oncology) Rolm Bookbinder, MD as Consulting Physician (General Surgery) Thea Silversmith, MD as Referring Physician (Radiation Oncology) OTHER MD:    CHIEF COMPLAINT: Estrogen receptor positive breast cancer  CURRENT TREATMENT:  observation   BREAST CANCER HISTORY: From Dr. Bernell List Khan's intake note 03/24/2013:  "Sabrina [name] is a 85 y.o. female. Who underwent a screening mammogram. She was found to have a right breast mass measuring 1 cm. There were no abnormalities on her physical exam. Patient had ultrasound performed that showed 1.3 x 1.2 x 1.0 cm hypoechoic mass in the upper outer quadrant of the right breast. In the right axilla there was an abnormal appearing lymph node measuring 1.65 1.4 x 0.8 cm. Both her biopsy. The lymph node was positive for metastatic carcinoma. The primary breast tumor biopsy showed invasive ductal carcinoma, grade 2, ER positive PR positive HER-2/neu equivocal with a proliferation marker Ki-67 15%.."  On 03/30/2013 the patient underwent right lumpectomy in right axillary lymph node sampling. The pathology ([id number]) showed a 1.4 cm invasive ductal carcinoma grade 3, present at the lateral and posterior margins. 3 axillary lymph nodes (non-sentinel lymph nodes) were sampled, one of which had a micrometastatic tumor deposits with extracapsular extension. Repeat HER-2 was positive, with a signals ratio of 2.54 and the number per cell of 3.55.  The patient was started on weekly Taxol and trastuzumab, but tolerated treatment poorly and develop bilateral DVTs and pulmonary embolism April 2015. At that time her chemotherapy was interrupted.   Her subsequent history is as detailed below.    INTERVAL HISTORY: Sabrina [name] is contacted today for follow-up and treatment of her estrogen receptor positive breast cancer.   She completes her 5 years of anastrozole this month.   She was supposed to continue on denosumab/Prolia with her most recent dose 02/06/2018. She tolerates this well and without any noticeable side effects.  However given the current pandemic and the risk of coming here we are postponing that dose indefinitely  Sabrina [name]'s last bone density screening on 08/21/2015, showed a T-score of -2.2, which is considered osteopenic. She is overdue for a repeat screening.     Since her last visit here, she underwent a digital diagnostic bilateral mammogram with tomography on 04/02/2018 showing Breast Density Category B. There is no evidence of malignancy. There is interval changes of probable congestive heart failure affecting the right breast more than the left breast.    REVIEW OF SYSTEMS: Sabrina [name] notes that she is having some sudden flashes where her face will get red, she gets warm, and her blood pressure will go up; her blood pressure has been as high as 329 systolic. On Monday and Tuesday, she vomited with these episodes. She is also occasionally dry heaving. She had a similar episode to this around a year ago. She notes that lately she has just not felt good. She was recently taken off of her Lipitor, Vitamin C, Biotin, and iron. The patient denies unusual headaches, visual changes, or dizziness. There has been no unusual cough, phlegm production, or pleurisy. This been no change in bowel or bladder habits. The patient denies unexplained weight loss, bleeding, or rash. A detailed review of systems was otherwise noncontributory.    PAST MEDICAL  HISTORY: Past Medical History:  Diagnosis Date  . Arthritis    "mostly in my hands, lower back" (06/25/2017)  . Basal cell carcinoma (BCC) of face 1983  . Breast cancer, right breast ([place]) 03/2013   . Chronic kidney disease (CKD), stage III (moderate) ([facility])    nephrologist, Dr. [name]  . Dental crowns present   . DVT (deep venous thrombosis) ([place]) ~ 06/2013   "? side"  . Gout    "on daily RX" (06/25/2017)  . Heart murmur    no known problems; states did not know she had murmur until age 85  . High cholesterol   . Hypertension    fluctuates, especially when stressed; has been on med. > 20 yr.  . Immature cataract   . Non-insulin dependent type 2 diabetes mellitus ([place])   . Personal history of chemotherapy   . Personal history of radiation therapy   . Pulmonary embolism ([place]) ~ 06/2013  . Radiation 09/06/13-10/20/13   Right Breast Cancer  . Stroke ([place]) 05/2017   just visual problems since (06/25/2017)  . Wears partial dentures    lower    PAST SURGICAL HISTORY: Past Surgical History:  Procedure Laterality Date  . AXILLARY LYMPH NODE DISSECTION Right 03/30/2013   Procedure: AXILLARY LYMPH NODE DISSECTION;  Surgeon: Rolm Bookbinder, MD;  Location: [facility];  Service: General;  Laterality: Right;  . BASAL CELL CARCINOMA EXCISION  1983   "face"  . BREAST BIOPSY Right 03/17/2013  . BREAST CYST EXCISION Right 11/1958   benign  . BREAST LUMPECTOMY Right 03/30/2013  . BREAST LUMPECTOMY WITH NEEDLE LOCALIZATION Right 03/30/2013   Procedure: BREAST LUMPECTOMY WITH NEEDLE LOCALIZATION;  Surgeon: Rolm Bookbinder, MD;  Location: [facility];  Service: General;  Laterality: Right;  . DILATION AND CURETTAGE OF UTERUS    . LOOP RECORDER INSERTION N/A 08/15/2017   Procedure: LOOP RECORDER INSERTION;  Surgeon: Evans Lance, MD;  Location: [facility];  Service: Cardiovascular;  Laterality: N/A;  . PORT-A-CATH REMOVAL  2016  . PORTACATH PLACEMENT N/A 04/15/2013   Procedure: INSERTION PORT-A-CATH;  Surgeon: Rolm Bookbinder, MD;  Location: [facility];  Service: General;  Laterality: N/A;  . RE-EXCISION OF BREAST CANCER,SUPERIOR MARGINS Right 04/15/2013   Procedure:  RE-EXCISION OF RIGHT BREAST  MARGINS;  Surgeon: Rolm Bookbinder, MD;  Location: [facility];  Service: General;  Laterality: Right;  . TONSILLECTOMY  ~ 1935/1936    FAMILY HISTORY Family History  Problem Relation Age of Onset  . Pneumonia Mother   . Heart attack Father   . Breast cancer Other 17       niece  . Breast cancer Other 35       niece  . Ovarian cancer Other        niece   the patient's father died in his 27s, in the setting of multiple medical problems. The patient's mother died in her 7s from pneumonia. The patient had 4 sisters, one of whom has died from complications of diabetes. She had a brother who died at 35 months. There is no history of breast cancer in the immediate family although the patient does have 2 nieces with breast cancer. The patient has been tested for the BRCA mutations and her genetics was normal  GYNECOLOGIC HISTORY:  No LMP recorded. Patient is postmenopausal. Menarche age 75, the patient is GX P0. She went through menopause in her 53s, took hormone replacement until she was in her 17s.   SOCIAL  HISTORY:  Sabrina [name] worked as the [employer] and locally. 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, more recently for the [place]: In place   HEALTH MAINTENANCE: Social History   Tobacco Use  . Smoking status: Never Smoker  . Smokeless tobacco: Never Used  . Tobacco comment: only smoked 2 packs cigarettes total; husband quit in 1971  Substance Use Topics  . Alcohol use: Yes    Alcohol/week: 3.0 standard drinks    Types: 3 Glasses of wine per week  . Drug use: No     Colonoscopy:   PAP:   Bone density: 08/21/2015 showed T score of -2.2 osteopenia   No Known Allergies  Current Outpatient Medications  Medication Sig Dispense Refill  . allopurinol  (ZYLOPRIM) 100 MG tablet Take 100 mg by mouth daily.     [name] aspirin EC 325 MG EC tablet Take 1 tablet (325 mg total) by mouth daily.    . cholecalciferol (VITAMIN D) 1000 UNITS tablet Take 1,000 Units by mouth daily.    . cloNIDine (CATAPRES) 0.2 MG tablet Take 0.2 mg by mouth 2 (two) times daily.    [name] denosumab (PROLIA) 60 MG/ML SOSY injection Inject 60 mg into the skin every 6 (six) months.    . furosemide (LASIX) 40 MG tablet TAKE 40 mg  TABLET EVERY OTHER DAY 60 tablet 0  . gabapentin (NEURONTIN) 300 MG capsule Take 1 capsule (300 mg total) by mouth at bedtime. 30 capsule 0  . glipiZIDE (GLUCOTROL) 5 MG tablet Take 5 mg by mouth 2 (two) times daily before a meal.     . hydrocortisone cream 1 % Apply 1 application topically daily as needed for itching.    . polyethylene glycol (MIRALAX / GLYCOLAX) packet Take 17 g by mouth daily.     . traMADol (ULTRAM) 50 MG tablet Take 50 mg by mouth 2 (two) times daily as needed for moderate pain.     . verapamil (VERELAN PM) 180 MG 24 hr capsule Take 180 mg by mouth at bedtime.     No current facility-administered medications for this visit.     OBJECTIVE: Elderly white woman who sounds frail over the phone There were no vitals filed for this visit.   There is no height or weight on file to calculate BMI.    ECOG FS:2 - Symptomatic, <50% confined to bed  LAB RESULTS:  CMP     Component Value Date/Time   NA 146 (H) 02/06/2018 1118   NA 140 01/30/2017 1053   K 4.4 02/06/2018 1118   K 4.4 01/30/2017 1053   CL 107 02/06/2018 1118   CO2 27 02/06/2018 1118   CO2 26 01/30/2017 1053   GLUCOSE 225 (H) 02/06/2018 1118   GLUCOSE 277 (H) 01/30/2017 1053   BUN 57 (H) 02/06/2018 1118   BUN 74.3 (H) 01/30/2017 1053   CREATININE 1.79 (H) 02/06/2018 1118   CREATININE 2.2 (H) 01/30/2017 1053   CALCIUM 9.7 02/06/2018 1118   CALCIUM 10.2 01/30/2017 1053   PROT 6.8 02/06/2018 1118   PROT 7.0 01/30/2017 1053   ALBUMIN 3.6 02/06/2018 1118   ALBUMIN 3.6  01/30/2017 1053   AST 11 (L) 02/06/2018 1118   AST 13 01/30/2017 1053   ALT 9 02/06/2018 1118   ALT 12 01/30/2017 1053   ALKPHOS 102  02/06/2018 1118   ALKPHOS 94 01/30/2017 1053   BILITOT 0.9 02/06/2018 1118   BILITOT 0.50 01/30/2017 1053   GFRNONAA 25 (L) 02/06/2018 1118   GFRAA 29 (L) 02/06/2018 1118    I No results found for: SPEP  Lab Results  Component Value Date   WBC 6.5 02/06/2018   NEUTROABS 3.9 02/06/2018   HGB 10.5 (L) 02/06/2018   HCT 33.2 (L) 02/06/2018   MCV 101.8 (H) 02/06/2018   PLT 184 02/06/2018      Chemistry      Component Value Date/Time   NA 146 (H) 02/06/2018 1118   NA 140 01/30/2017 1053   K 4.4 02/06/2018 1118   K 4.4 01/30/2017 1053   CL 107 02/06/2018 1118   CO2 27 02/06/2018 1118   CO2 26 01/30/2017 1053   BUN 57 (H) 02/06/2018 1118   BUN 74.3 (H) 01/30/2017 1053   CREATININE 1.79 (H) 02/06/2018 1118   CREATININE 2.2 (H) 01/30/2017 1053      Component Value Date/Time   CALCIUM 9.7 02/06/2018 1118   CALCIUM 10.2 01/30/2017 1053   ALKPHOS 102 02/06/2018 1118   ALKPHOS 94 01/30/2017 1053   AST 11 (L) 02/06/2018 1118   AST 13 01/30/2017 1053   ALT 9 02/06/2018 1118   ALT 12 01/30/2017 1053   BILITOT 0.9 02/06/2018 1118   BILITOT 0.50 01/30/2017 1053       No results found for: LABCA2  No components found for: TTSVX793  No results for input(s): INR in the last 168 hours.  Urinalysis    Component Value Date/Time   COLORURINE STRAW (A) 08/15/2017 2054   APPEARANCEUR CLEAR 08/15/2017 2054   LABSPEC 1.013 08/15/2017 2054   PHURINE 7.0 08/15/2017 2054   GLUCOSEU 50 (A) 08/15/2017 2054   HGBUR NEGATIVE 08/15/2017 2054   BILIRUBINUR NEGATIVE 08/15/2017 2054   [place] NEGATIVE 08/15/2017 2054   PROTEINUR 100 (A) 08/15/2017 2054   UROBILINOGEN 0.2 09/13/2013 0523   NITRITE NEGATIVE 08/15/2017 2054   LEUKOCYTESUR NEGATIVE 08/15/2017 2054    STUDIES: No results found.   ASSESSMENT: 85 y.o. [place] woman status post  right lumpectomy and axillary lymph node sampling 03/30/2013 for a pT1c pN1a, stage IIA invasive ductal carcinoma, grade 3, estrogen receptor 100% positive, progesterone receptor 100% positive, with an MIB-1 of 15%, and HER-2 amplified, with a signals ratio of 2.54 and the number per cell being 3.55.  (a) margins were positive but cleared with additional surgery February 2015 ([id number])  (1) adjuvant chemotherapy /immunotherapy with paclitaxel and trastuzumab weekly started 05/10/2013, discontinued after 2 doses because of poor tolerance  (2) ventilation/perfusion scan 06/10/2013 documented right lower lobe pulmonary emboli; Doppler ultrasound 06/11/2013 documented bilateral lower extremity DVTs; started on warfarin managed through Dr. Carlyle Lipa office  (3) anastrozole started June 2015, completing 5 years June 2020  (4) adjuvant radiation completed 10/20/2013  (5) Trastuzumab resumed 09/07/2013, continued through 06/07/2014;  final echocardiogram on 03/30/2016showed a well preserved ejection fraction  (6) genetics testing April 2015 was normal and did not reveal a mutation in any of these genes: APC, ATM, AXIN2, BARD1, BMPRIA, BRCA1, BRCA2, BRIP1, CDH1, CDK4, CDKN2A, CHEK2, EPCAM, FANCC, MLH1, MSH2, MSH6, MUTYH, NBN, PALB2, PMS2, PTEN, RAD51C, SMAD4, STK11, TP53, VHL, and XRCC2  (7) osteopenia: DEXA scan 08/17/2013 showed a T score of -2.1--  (a) started Prolia (denosumab) 01/11/2014, repeated every 6 months  (b) bone density 08/21/2015 showed a T score of -2.2 osteopenia   PLAN: Aaliyan is now 5 and half years  out from definitive surgery for her breast cancer with no evidence of disease recurrence.  This is very favorable.  She tolerated anastrozole generally quite well.  She has completed 5 years.  I do not anticipate any significant benefit from continuing anastrozole beyond this point in her case and accordingly she is stopping it now.  I am hoping the flushing experiences she is having  will resolve as she goes off anastrozole.  However I am concerned that she has had some very high blood pressures.  I wonder if she is inadvertently leaving out some clonidine doses.  I asked her to make sure that she takes her clonidine exactly as prescribed and the right time each day to avoid that issue  Normally she would receive Prolia today but we are going to not see her here until after she has had a coronavirus vaccine.  That may be next year.  She can receive Prolia that same day as appropriate.  She would need a repeat bone density study at some point as well  Sabrina [name] has excellent care through her gerontologist Dr. Felipa Eth.  She will continue to contact him with any other concerns that may develop before that time     Jarrett Chicoine, Virgie Dad, MD  08/07/18 9:49 AM Medical Oncology and Hematology [facility] [place], [place] [address] Tel. [phone]    Fax. [phone]  I, Jacqualyn Posey am acting as a Education administrator for Chauncey Cruel, MD.   I, Lurline Del MD, have reviewed the above documentation for accuracy and completeness, and I agree with the above.

## 2018-08-06 NOTE — Telephone Encounter (Signed)
Contacted patient to verify telephone visit for pre reg °

## 2018-08-07 ENCOUNTER — Inpatient Hospital Stay: Payer: Medicare Other | Attending: Oncology | Admitting: Oncology

## 2018-08-07 ENCOUNTER — Ambulatory Visit: Payer: Medicare Other | Admitting: Oncology

## 2018-08-07 ENCOUNTER — Ambulatory Visit: Payer: Medicare Other

## 2018-08-07 ENCOUNTER — Other Ambulatory Visit: Payer: Medicare Other

## 2018-08-07 DIAGNOSIS — Z8673 Personal history of transient ischemic attack (TIA), and cerebral infarction without residual deficits: Secondary | ICD-10-CM | POA: Insufficient documentation

## 2018-08-07 DIAGNOSIS — Z86718 Personal history of other venous thrombosis and embolism: Secondary | ICD-10-CM | POA: Insufficient documentation

## 2018-08-07 DIAGNOSIS — Z79811 Long term (current) use of aromatase inhibitors: Secondary | ICD-10-CM | POA: Insufficient documentation

## 2018-08-07 DIAGNOSIS — Z803 Family history of malignant neoplasm of breast: Secondary | ICD-10-CM | POA: Insufficient documentation

## 2018-08-07 DIAGNOSIS — Z170 Estrogen receptor positive status [ER+]: Secondary | ICD-10-CM | POA: Diagnosis not present

## 2018-08-07 DIAGNOSIS — C50411 Malignant neoplasm of upper-outer quadrant of right female breast: Secondary | ICD-10-CM | POA: Insufficient documentation

## 2018-08-07 DIAGNOSIS — Z79899 Other long term (current) drug therapy: Secondary | ICD-10-CM | POA: Insufficient documentation

## 2018-08-07 DIAGNOSIS — Z7982 Long term (current) use of aspirin: Secondary | ICD-10-CM | POA: Insufficient documentation

## 2018-08-07 DIAGNOSIS — Z7984 Long term (current) use of oral hypoglycemic drugs: Secondary | ICD-10-CM | POA: Insufficient documentation

## 2018-08-07 DIAGNOSIS — I10 Essential (primary) hypertension: Secondary | ICD-10-CM | POA: Insufficient documentation

## 2018-08-07 DIAGNOSIS — Z923 Personal history of irradiation: Secondary | ICD-10-CM | POA: Insufficient documentation

## 2018-08-07 DIAGNOSIS — N183 Chronic kidney disease, stage 3 (moderate): Secondary | ICD-10-CM | POA: Insufficient documentation

## 2018-08-07 DIAGNOSIS — Z833 Family history of diabetes mellitus: Secondary | ICD-10-CM | POA: Insufficient documentation

## 2018-08-07 DIAGNOSIS — I639 Cerebral infarction, unspecified: Secondary | ICD-10-CM

## 2018-08-07 DIAGNOSIS — Z8041 Family history of malignant neoplasm of ovary: Secondary | ICD-10-CM | POA: Insufficient documentation

## 2018-08-07 DIAGNOSIS — M8580 Other specified disorders of bone density and structure, unspecified site: Secondary | ICD-10-CM | POA: Diagnosis not present

## 2018-08-07 DIAGNOSIS — Z7901 Long term (current) use of anticoagulants: Secondary | ICD-10-CM | POA: Insufficient documentation

## 2018-08-07 DIAGNOSIS — E119 Type 2 diabetes mellitus without complications: Secondary | ICD-10-CM | POA: Insufficient documentation

## 2018-08-07 DIAGNOSIS — Z86711 Personal history of pulmonary embolism: Secondary | ICD-10-CM | POA: Insufficient documentation

## 2018-08-07 DIAGNOSIS — E7800 Pure hypercholesterolemia, unspecified: Secondary | ICD-10-CM | POA: Insufficient documentation

## 2018-08-10 ENCOUNTER — Other Ambulatory Visit: Payer: Self-pay | Admitting: Oncology

## 2018-08-10 ENCOUNTER — Telehealth: Payer: Self-pay | Admitting: Oncology

## 2018-08-10 NOTE — Telephone Encounter (Signed)
Talk with patient regarding schedule °

## 2018-08-13 ENCOUNTER — Ambulatory Visit (INDEPENDENT_AMBULATORY_CARE_PROVIDER_SITE_OTHER): Payer: Medicare Other | Admitting: *Deleted

## 2018-08-13 DIAGNOSIS — I639 Cerebral infarction, unspecified: Secondary | ICD-10-CM

## 2018-08-14 LAB — CUP PACEART REMOTE DEVICE CHECK
Date Time Interrogation Session: 20200611201012
Implantable Pulse Generator Implant Date: 20190614

## 2018-08-19 NOTE — Progress Notes (Signed)
[facility] Summary Report / Loop Recorder 

## 2018-09-15 ENCOUNTER — Ambulatory Visit (INDEPENDENT_AMBULATORY_CARE_PROVIDER_SITE_OTHER): Payer: Medicare Other | Admitting: *Deleted

## 2018-09-15 DIAGNOSIS — I5032 Chronic diastolic (congestive) heart failure: Secondary | ICD-10-CM | POA: Diagnosis not present

## 2018-09-16 LAB — CUP PACEART REMOTE DEVICE CHECK
Date Time Interrogation Session: 20200714203704
Implantable Pulse Generator Implant Date: 20190614

## 2018-09-26 ENCOUNTER — Inpatient Hospital Stay (HOSPITAL_COMMUNITY)
Admission: EM | Admit: 2018-09-26 | Discharge: 2018-09-29 | DRG: 563 | Disposition: A | Discharge status: Home Health | Payer: Medicare Other | Attending: Internal Medicine | Admitting: Internal Medicine

## 2018-09-26 ENCOUNTER — Emergency Department (HOSPITAL_COMMUNITY): Payer: Medicare Other

## 2018-09-26 ENCOUNTER — Encounter (HOSPITAL_COMMUNITY): Payer: Self-pay | Admitting: Internal Medicine

## 2018-09-26 ENCOUNTER — Other Ambulatory Visit: Payer: Self-pay

## 2018-09-26 DIAGNOSIS — Y92009 Unspecified place in unspecified non-institutional (private) residence as the place of occurrence of the external cause: Secondary | ICD-10-CM

## 2018-09-26 DIAGNOSIS — R001 Bradycardia, unspecified: Secondary | ICD-10-CM | POA: Diagnosis not present

## 2018-09-26 DIAGNOSIS — R296 Repeated falls: Secondary | ICD-10-CM | POA: Diagnosis not present

## 2018-09-26 DIAGNOSIS — N183 Chronic kidney disease, stage 3 (moderate): Secondary | ICD-10-CM | POA: Diagnosis not present

## 2018-09-26 DIAGNOSIS — Z86718 Personal history of other venous thrombosis and embolism: Secondary | ICD-10-CM

## 2018-09-26 DIAGNOSIS — Z8249 Family history of ischemic heart disease and other diseases of the circulatory system: Secondary | ICD-10-CM | POA: Diagnosis not present

## 2018-09-26 DIAGNOSIS — Z20828 Contact with and (suspected) exposure to other viral communicable diseases: Secondary | ICD-10-CM | POA: Diagnosis present

## 2018-09-26 DIAGNOSIS — Z8673 Personal history of transient ischemic attack (TIA), and cerebral infarction without residual deficits: Secondary | ICD-10-CM | POA: Diagnosis not present

## 2018-09-26 DIAGNOSIS — Z888 Allergy status to other drugs, medicaments and biological substances status: Secondary | ICD-10-CM | POA: Diagnosis not present

## 2018-09-26 DIAGNOSIS — Z86711 Personal history of pulmonary embolism: Secondary | ICD-10-CM

## 2018-09-26 DIAGNOSIS — E119 Type 2 diabetes mellitus without complications: Secondary | ICD-10-CM

## 2018-09-26 DIAGNOSIS — I10 Essential (primary) hypertension: Secondary | ICD-10-CM | POA: Diagnosis present

## 2018-09-26 DIAGNOSIS — E860 Dehydration: Secondary | ICD-10-CM | POA: Diagnosis not present

## 2018-09-26 DIAGNOSIS — Z7984 Long term (current) use of oral hypoglycemic drugs: Secondary | ICD-10-CM

## 2018-09-26 DIAGNOSIS — N184 Chronic kidney disease, stage 4 (severe): Secondary | ICD-10-CM | POA: Diagnosis present

## 2018-09-26 DIAGNOSIS — S42009A Fracture of unspecified part of unspecified clavicle, initial encounter for closed fracture: Secondary | ICD-10-CM | POA: Diagnosis present

## 2018-09-26 DIAGNOSIS — E785 Hyperlipidemia, unspecified: Secondary | ICD-10-CM | POA: Diagnosis present

## 2018-09-26 DIAGNOSIS — Z853 Personal history of malignant neoplasm of breast: Secondary | ICD-10-CM | POA: Diagnosis not present

## 2018-09-26 DIAGNOSIS — I130 Hypertensive heart and chronic kidney disease with heart failure and stage 1 through stage 4 chronic kidney disease, or unspecified chronic kidney disease: Secondary | ICD-10-CM | POA: Diagnosis not present

## 2018-09-26 DIAGNOSIS — M109 Gout, unspecified: Secondary | ICD-10-CM | POA: Diagnosis present

## 2018-09-26 DIAGNOSIS — N179 Acute kidney failure, unspecified: Secondary | ICD-10-CM | POA: Diagnosis present

## 2018-09-26 DIAGNOSIS — E1159 Type 2 diabetes mellitus with other circulatory complications: Secondary | ICD-10-CM | POA: Diagnosis present

## 2018-09-26 DIAGNOSIS — E0822 Diabetes mellitus due to underlying condition with diabetic chronic kidney disease: Secondary | ICD-10-CM

## 2018-09-26 DIAGNOSIS — M19041 Primary osteoarthritis, right hand: Secondary | ICD-10-CM | POA: Diagnosis present

## 2018-09-26 DIAGNOSIS — E1122 Type 2 diabetes mellitus with diabetic chronic kidney disease: Secondary | ICD-10-CM

## 2018-09-26 DIAGNOSIS — S0990XA Unspecified injury of head, initial encounter: Secondary | ICD-10-CM | POA: Diagnosis not present

## 2018-09-26 DIAGNOSIS — E7800 Pure hypercholesterolemia, unspecified: Secondary | ICD-10-CM | POA: Diagnosis present

## 2018-09-26 DIAGNOSIS — S42032A Displaced fracture of lateral end of left clavicle, initial encounter for closed fracture: Principal | ICD-10-CM | POA: Diagnosis present

## 2018-09-26 DIAGNOSIS — Z9221 Personal history of antineoplastic chemotherapy: Secondary | ICD-10-CM

## 2018-09-26 DIAGNOSIS — S0003XA Contusion of scalp, initial encounter: Secondary | ICD-10-CM | POA: Diagnosis not present

## 2018-09-26 DIAGNOSIS — S42002A Fracture of unspecified part of left clavicle, initial encounter for closed fracture: Secondary | ICD-10-CM

## 2018-09-26 DIAGNOSIS — R279 Unspecified lack of coordination: Secondary | ICD-10-CM | POA: Diagnosis not present

## 2018-09-26 DIAGNOSIS — Z85828 Personal history of other malignant neoplasm of skin: Secondary | ICD-10-CM | POA: Diagnosis not present

## 2018-09-26 DIAGNOSIS — I5032 Chronic diastolic (congestive) heart failure: Secondary | ICD-10-CM | POA: Diagnosis present

## 2018-09-26 DIAGNOSIS — S42002D Fracture of unspecified part of left clavicle, subsequent encounter for fracture with routine healing: Secondary | ICD-10-CM | POA: Diagnosis not present

## 2018-09-26 DIAGNOSIS — W010XXA Fall on same level from slipping, tripping and stumbling without subsequent striking against object, initial encounter: Secondary | ICD-10-CM

## 2018-09-26 DIAGNOSIS — Z743 Need for continuous supervision: Secondary | ICD-10-CM | POA: Diagnosis not present

## 2018-09-26 DIAGNOSIS — W19XXXA Unspecified fall, initial encounter: Secondary | ICD-10-CM | POA: Diagnosis present

## 2018-09-26 DIAGNOSIS — R112 Nausea with vomiting, unspecified: Secondary | ICD-10-CM | POA: Diagnosis not present

## 2018-09-26 DIAGNOSIS — R52 Pain, unspecified: Secondary | ICD-10-CM | POA: Diagnosis not present

## 2018-09-26 DIAGNOSIS — Z79891 Long term (current) use of opiate analgesic: Secondary | ICD-10-CM

## 2018-09-26 DIAGNOSIS — Z7982 Long term (current) use of aspirin: Secondary | ICD-10-CM

## 2018-09-26 DIAGNOSIS — Z03818 Encounter for observation for suspected exposure to other biological agents ruled out: Secondary | ICD-10-CM | POA: Diagnosis not present

## 2018-09-26 DIAGNOSIS — M19042 Primary osteoarthritis, left hand: Secondary | ICD-10-CM | POA: Diagnosis present

## 2018-09-26 DIAGNOSIS — Z803 Family history of malignant neoplasm of breast: Secondary | ICD-10-CM

## 2018-09-26 DIAGNOSIS — Z923 Personal history of irradiation: Secondary | ICD-10-CM

## 2018-09-26 DIAGNOSIS — Z79899 Other long term (current) drug therapy: Secondary | ICD-10-CM

## 2018-09-26 DIAGNOSIS — Z8041 Family history of malignant neoplasm of ovary: Secondary | ICD-10-CM

## 2018-09-26 LAB — CBC
HCT: 34.6 % — ABNORMAL LOW (ref 36.0–46.0)
Hemoglobin: 10.7 g/dL — ABNORMAL LOW (ref 12.0–15.0)
MCH: 31.7 pg (ref 26.0–34.0)
MCHC: 30.9 g/dL (ref 30.0–36.0)
MCV: 102.4 fL — ABNORMAL HIGH (ref 80.0–100.0)
Platelets: 184 10*3/uL (ref 150–400)
RBC: 3.38 MIL/uL — ABNORMAL LOW (ref 3.87–5.11)
RDW: 13.9 % (ref 11.5–15.5)
WBC: 9.3 10*3/uL (ref 4.0–10.5)
nRBC: 0 % (ref 0.0–0.2)

## 2018-09-26 LAB — URINALYSIS, ROUTINE W REFLEX MICROSCOPIC
Bacteria, UA: NONE SEEN
Bilirubin Urine: NEGATIVE
Glucose, UA: NEGATIVE mg/dL
Hgb urine dipstick: NEGATIVE
Ketones, ur: NEGATIVE mg/dL
Leukocytes,Ua: NEGATIVE
Nitrite: NEGATIVE
Protein, ur: 100 mg/dL — AB
Specific Gravity, Urine: 1.011 (ref 1.005–1.030)
pH: 7 (ref 5.0–8.0)

## 2018-09-26 LAB — GLUCOSE, CAPILLARY
Glucose-Capillary: 144 mg/dL — ABNORMAL HIGH (ref 70–99)
Glucose-Capillary: 187 mg/dL — ABNORMAL HIGH (ref 70–99)

## 2018-09-26 LAB — COMPREHENSIVE METABOLIC PANEL
ALT: 14 U/L (ref 0–44)
AST: 15 U/L (ref 15–41)
Albumin: 3.8 g/dL (ref 3.5–5.0)
Alkaline Phosphatase: 85 U/L (ref 38–126)
Anion gap: 11 (ref 5–15)
BUN: 76 mg/dL — ABNORMAL HIGH (ref 8–23)
CO2: 30 mmol/L (ref 22–32)
Calcium: 11.3 mg/dL — ABNORMAL HIGH (ref 8.9–10.3)
Chloride: 104 mmol/L (ref 98–111)
Creatinine, Ser: 2.05 mg/dL — ABNORMAL HIGH (ref 0.44–1.00)
GFR calc Af Amer: 24 mL/min — ABNORMAL LOW (ref >60)
GFR calc non Af Amer: 21 mL/min — ABNORMAL LOW (ref >60)
Glucose, Bld: 123 mg/dL — ABNORMAL HIGH (ref 70–99)
Potassium: 3.7 mmol/L (ref 3.5–5.1)
Sodium: 145 mmol/L (ref 135–145)
Total Bilirubin: 0.8 mg/dL (ref 0.3–1.2)
Total Protein: 7 g/dL (ref 6.5–8.1)

## 2018-09-26 LAB — SARS CORONAVIRUS 2 BY RT PCR (HOSPITAL ORDER, PERFORMED IN ~~LOC~~ HOSPITAL LAB): SARS Coronavirus 2: NEGATIVE

## 2018-09-26 MED ORDER — ACETAMINOPHEN 650 MG RE SUPP: 650.0000 mg | Freq: Four times a day (QID) | RECTAL | Status: DC | PRN

## 2018-09-26 MED ORDER — HYDRALAZINE HCL 20 MG/ML IJ SOLN
5.0000 mg | Freq: Four times a day (QID) | INTRAMUSCULAR | Status: DC | PRN
  Administered 2018-09-26: 5 mg via INTRAVENOUS
  Filled 2018-09-26: qty 1

## 2018-09-26 MED ORDER — POLYETHYLENE GLYCOL 3350 17 G PO PACK
17.0000 g | PACK | Freq: Every day | ORAL | Status: DC
  Administered 2018-09-27 – 2018-09-29 (×3): 17 g via ORAL
  Filled 2018-09-26 (×3): qty 1

## 2018-09-26 MED ORDER — GABAPENTIN 100 MG PO CAPS
200.0000 mg | ORAL_CAPSULE | Freq: Two times a day (BID) | ORAL | Status: DC
  Administered 2018-09-26 – 2018-09-29 (×7): 200 mg via ORAL
  Filled 2018-09-26 (×7): qty 2

## 2018-09-26 MED ORDER — SODIUM CHLORIDE 0.9 % IV BOLUS
1000.0000 mL | Freq: Once | INTRAVENOUS | Status: AC
  Administered 2018-09-26: 1000 mL via INTRAVENOUS

## 2018-09-26 MED ORDER — ACETAMINOPHEN 325 MG PO TABS: 650.0000 mg | ORAL_TABLET | Freq: Four times a day (QID) | ORAL | Status: DC | PRN

## 2018-09-26 MED ORDER — GABAPENTIN 300 MG PO CAPS
300.0000 mg | ORAL_CAPSULE | Freq: Every day | ORAL | Status: DC
  Administered 2018-09-26 – 2018-09-28 (×3): 300 mg via ORAL
  Filled 2018-09-26 (×3): qty 1

## 2018-09-26 MED ORDER — ALLOPURINOL 100 MG PO TABS
100.0000 mg | ORAL_TABLET | Freq: Every day | ORAL | Status: DC
  Administered 2018-09-26 – 2018-09-29 (×4): 100 mg via ORAL
  Filled 2018-09-26 (×4): qty 1

## 2018-09-26 MED ORDER — ASPIRIN EC 325 MG PO TBEC
325.0000 mg | DELAYED_RELEASE_TABLET | Freq: Every day | ORAL | Status: DC
  Administered 2018-09-26 – 2018-09-29 (×4): 325 mg via ORAL
  Filled 2018-09-26 (×4): qty 1

## 2018-09-26 MED ORDER — ACETAMINOPHEN 325 MG PO TABS
650.0000 mg | ORAL_TABLET | Freq: Once | ORAL | Status: AC
  Administered 2018-09-26: 650 mg via ORAL
  Filled 2018-09-26: qty 2

## 2018-09-26 MED ORDER — TRAMADOL HCL 50 MG PO TABS: 50.0000 mg | ORAL_TABLET | Freq: Two times a day (BID) | ORAL | Status: DC | PRN

## 2018-09-26 MED ORDER — INSULIN ASPART 100 UNIT/ML ~~LOC~~ SOLN: 0.0000 [IU] | Freq: Every day | SUBCUTANEOUS | Status: DC

## 2018-09-26 MED ORDER — ONDANSETRON 4 MG PO TBDP
4.0000 mg | ORAL_TABLET | Freq: Three times a day (TID) | ORAL | Status: DC | PRN
  Administered 2018-09-26: 4 mg via ORAL
  Filled 2018-09-26: qty 1

## 2018-09-26 MED ORDER — ENOXAPARIN SODIUM 30 MG/0.3ML ~~LOC~~ SOLN
30.0000 mg | SUBCUTANEOUS | Status: DC
  Administered 2018-09-26 – 2018-09-28 (×3): 30 mg via SUBCUTANEOUS
  Filled 2018-09-26 (×3): qty 0.3

## 2018-09-26 MED ORDER — INSULIN ASPART 100 UNIT/ML ~~LOC~~ SOLN
0.0000 [IU] | Freq: Three times a day (TID) | SUBCUTANEOUS | Status: DC
  Administered 2018-09-27 – 2018-09-29 (×4): 1 [IU] via SUBCUTANEOUS
  Administered 2018-09-29: 2 [IU] via SUBCUTANEOUS

## 2018-09-26 MED ORDER — SODIUM CHLORIDE 0.9 % IV SOLN
INTRAVENOUS | Status: DC
  Administered 2018-09-26 – 2018-09-29 (×6): via INTRAVENOUS

## 2018-09-26 MED ORDER — VERAPAMIL HCL ER 180 MG PO TBCR
180.0000 mg | EXTENDED_RELEASE_TABLET | Freq: Every day | ORAL | Status: DC
  Administered 2018-09-26 – 2018-09-27 (×2): 180 mg via ORAL
  Filled 2018-09-26 (×2): qty 1

## 2018-09-26 MED ORDER — CLONIDINE HCL 0.2 MG PO TABS
0.2000 mg | ORAL_TABLET | Freq: Two times a day (BID) | ORAL | Status: DC
  Administered 2018-09-26 – 2018-09-29 (×5): 0.2 mg via ORAL
  Filled 2018-09-26 (×6): qty 1

## 2018-09-26 NOTE — ED Notes (Signed)
Ice pack applied to affected area.

## 2018-09-26 NOTE — ED Notes (Signed)
Ortho tech consulted for left shoulder immobilizer

## 2018-09-26 NOTE — ED Notes (Signed)
hospitalist at bedside

## 2018-09-26 NOTE — Progress Notes (Signed)
Consult request has been received. CSW attempting to follow up at present time  Amahd Morino M. Brooklyne Radke LCSWA Transitions of Care  Clinical Social Worker  Ph: [phone] 

## 2018-09-26 NOTE — ED Provider Notes (Signed)
[facility] DEPT Provider Note   CSN: [id number] Arrival date & time: 09/26/18  1018     History   Chief Complaint Chief Complaint  Patient presents with   [facility] Sabrina [name] is a 85 y.o. female.     Patient s/p fall at home this AM. Patient unsure what caused fall, whether mechanical or possibly syncope. She remembers bending over the pick up a tissue on floor, then was going to throw it away and fell. ?momentary loc. Contusion to left scalp in fall, and c/o left shoulder pain. Pain constant, dull, moderate, worse w palpation. No severe headache. No neck or back pain. No chest pain or sob. Patient notes for long while/many months, has recurrent nausea/vomiting episodes - states that occurred last night, and notes recent poor po intake. Denies diarrhea. No abd pain. Denies dysuria or gu c/o. No cough or uri c/o. No fever or chills. No known covid exposure. Pt was unable to get up under own power, had to use alert necklace, and EMS had to break door to assist pt. Indicates lives at home with elderly husband w dementia and poor mobility. At baseline, walks w walker.   The history is provided by the patient and the EMS personnel.  Fall Pertinent negatives include no chest pain, no abdominal pain, no headaches and no shortness of breath.    Past Medical History:  Diagnosis Date   Arthritis    "mostly in my hands, lower back" (06/25/2017)   Basal cell carcinoma (BCC) of face 1983   Breast cancer, right breast ([place]) 03/2013   Chronic kidney disease (CKD), stage III (moderate) ([place])    nephrologist, Dr. [name]   Dental crowns present    DVT (deep venous thrombosis) ([place]) ~ 06/2013   "? side"   Gout    "on daily RX" (06/25/2017)   Heart murmur    no known problems; states did not know she had murmur until age 43   High cholesterol    Hypertension    fluctuates, especially when stressed; has been on med. > 20 yr.    Immature cataract    Non-insulin dependent type 2 diabetes mellitus ([place])    Personal history of chemotherapy    Personal history of radiation therapy    Pulmonary embolism ([place]) ~ 06/2013   Radiation 09/06/13-10/20/13   Right Breast Cancer   Stroke ([place]) 05/2017   just visual problems since (06/25/2017)   Wears partial dentures    lower    Patient Active Problem List   Diagnosis Date Noted   Blurry vision, bilateral    Acute blood loss anemia    History of breast cancer 05/30/2017   Weakness 05/30/2017   Occipital infarction ([place]) 05/30/2017   Pressure injury of skin 05/28/2017   Fall at home 05/27/2017   CKD (chronic kidney disease), stage IV ([place]) 05/27/2017   High cholesterol 05/27/2017   DM (diabetes mellitus) ([place]) 05/27/2017   Hypertension 05/27/2017   Gout 05/27/2017   Elevated troponin 05/27/2017   Lipoma of lower extremity 09/12/2014   Abnormal x-ray 03/15/2014   Chronic diastolic congestive heart failure ([place]) 02/23/2014   Osteopenia 11/30/2013   Hypoglycemia 09/13/2013   Acute respiratory failure with hypoxia ([place]) 06/14/2013   History of pulmonary embolism 06/10/2013   Nausea and vomiting 06/10/2013   Accelerated hypertension 06/10/2013   Aortic stenosis 04/22/2013   Edema leg 04/22/2013   Breast cancer of upper-outer quadrant of right female breast (  [place]) 03/19/2013    Past Surgical History:  Procedure Laterality Date   AXILLARY LYMPH NODE DISSECTION Right 03/30/2013   Procedure: AXILLARY LYMPH NODE DISSECTION;  Surgeon: Rolm Bookbinder, MD;  Location: [facility];  Service: General;  Laterality: Right;   BASAL CELL CARCINOMA EXCISION  1983   "face"   BREAST BIOPSY Right 03/17/2013   BREAST CYST EXCISION Right 11/1958   benign   BREAST LUMPECTOMY Right 03/30/2013   BREAST LUMPECTOMY WITH NEEDLE LOCALIZATION Right 03/30/2013   Procedure: BREAST LUMPECTOMY WITH NEEDLE LOCALIZATION;  Surgeon: Rolm Bookbinder, MD;  Location: [facility];  Service: General;  Laterality: Right;   DILATION AND CURETTAGE OF UTERUS     LOOP RECORDER INSERTION N/A 08/15/2017   Procedure: LOOP RECORDER INSERTION;  Surgeon: Evans Lance, MD;  Location: [facility];  Service: Cardiovascular;  Laterality: N/A;   PORT-A-CATH REMOVAL  2016   PORTACATH PLACEMENT N/A 04/15/2013   Procedure: INSERTION PORT-A-CATH;  Surgeon: Rolm Bookbinder, MD;  Location: [facility];  Service: General;  Laterality: N/A;   RE-EXCISION OF BREAST CANCER,SUPERIOR MARGINS Right 04/15/2013   Procedure: RE-EXCISION OF RIGHT BREAST  MARGINS;  Surgeon: Rolm Bookbinder, MD;  Location: [facility];  Service: General;  Laterality: Right;   TONSILLECTOMY  ~ 1935/1936     OB History   No obstetric history on file.    Obstetric Comments  Menarche ae 14 No children Married 60 years         Home Medications    Prior to Admission medications   Medication Sig Start Date End Date Taking? Authorizing Provider  allopurinol (ZYLOPRIM) 100 MG tablet Take 100 mg by mouth daily.  01/17/16   [provider]  aspirin EC 325 MG EC tablet Take 1 tablet (325 mg total) by mouth daily. 06/04/17   Patrecia Pour, MD  cholecalciferol (VITAMIN D) 1000 UNITS tablet Take 1,000 Units by mouth daily.    [provider]  cloNIDine (CATAPRES) 0.2 MG tablet Take 0.2 mg by mouth 2 (two) times daily.    [provider]  denosumab (PROLIA) 60 MG/ML SOSY injection Inject 60 mg into the skin every 6 (six) months.    [provider]  furosemide (LASIX) 40 MG tablet TAKE 40 mg  TABLET EVERY OTHER DAY 06/04/17   Patrecia Pour, MD  gabapentin (NEURONTIN) 300 MG capsule Take 1 capsule (300 mg total) by mouth at bedtime. 01/21/18   Venancio Poisson, NP  glipiZIDE (GLUCOTROL) 5 MG tablet Take 5 mg by mouth 2 (two) times daily before a meal.     [provider]  hydrocortisone cream 1 % Apply 1 application topically daily as  needed for itching.    [provider]  polyethylene glycol (MIRALAX / GLYCOLAX) packet Take 17 g by mouth daily.     [provider]  traMADol (ULTRAM) 50 MG tablet Take 50 mg by mouth 2 (two) times daily as needed for moderate pain.     [provider]  verapamil (VERELAN PM) 180 MG 24 hr capsule Take 180 mg by mouth at bedtime.    [provider]    Family History Family History  Problem Relation Age of Onset   Pneumonia Mother    Heart attack Father    Breast cancer Other 33       niece   Breast cancer Other 74       niece   Ovarian cancer Other  niece    Social History Social History   Tobacco Use   Smoking status: Never Smoker   Smokeless tobacco: Never Used   Tobacco comment: only smoked 2 packs cigarettes total; husband quit in 1971  Substance Use Topics   Alcohol use: Yes    Alcohol/week: 3.0 standard drinks    Types: 3 Glasses of wine per week   Drug use: No     Allergies   Beta adrenergic blockers   Review of Systems Review of Systems  Constitutional: Negative for chills and fever.  HENT: Negative for sore throat.   Eyes: Negative for redness.  Respiratory: Negative for cough and shortness of breath.   Cardiovascular: Negative for chest pain, palpitations and leg swelling.  Gastrointestinal: Positive for nausea and vomiting. Negative for abdominal pain and diarrhea.  Endocrine: Negative for polyuria.  Genitourinary: Negative for dysuria and flank pain.  Musculoskeletal: Negative for back pain and neck pain.  Skin: Negative for wound.  Neurological: Negative for weakness, numbness and headaches.  Hematological: Does not bruise/bleed easily.  Psychiatric/Behavioral: Negative for confusion.     Physical Exam Updated Vital Signs BP (!) 186/67    Pulse 67    Temp 98 F (36.7 C) (Oral)    Resp 15    Ht 1.499 m (4\' 11" )    Wt 74.8 kg    SpO2 97%    BMI 33.33 kg/m   Physical Exam Vitals signs and  nursing note reviewed.  Constitutional:      Appearance: Normal appearance. She is well-developed.  HENT:     Head: Atraumatic.     Nose: Nose normal.     Mouth/Throat:     Mouth: Mucous membranes are moist.  Eyes:     General: No scleral icterus.    Conjunctiva/sclera: Conjunctivae normal.     Pupils: Pupils are equal, round, and reactive to light.  Neck:     Musculoskeletal: Normal range of motion and neck supple. No neck rigidity or muscular tenderness.     Vascular: No carotid bruit.     Trachea: No tracheal deviation.  Cardiovascular:     Rate and Rhythm: Normal rate and regular rhythm.     Pulses: Normal pulses.     Heart sounds: Normal heart sounds. No murmur. No friction rub. No gallop.   Pulmonary:     Effort: Pulmonary effort is normal. No respiratory distress.     Breath sounds: Normal breath sounds.  Abdominal:     General: Bowel sounds are normal. There is no distension.     Palpations: Abdomen is soft. There is no mass.     Tenderness: There is no abdominal tenderness. There is no guarding or rebound.     Hernia: No hernia is present.  Genitourinary:    Comments: No cva tenderness.  Musculoskeletal:        General: No swelling.     Comments: CTLS spine, non tender, aligned, no step off. Tenderness left shoulder. No gross deformity noted. Distal pulses palp bil.   Skin:    General: Skin is warm and dry.     Findings: No rash.  Neurological:     Mental Status: She is alert.     Comments: Alert, speech normal/fluent. Motor intact bil, stre 5/5. sens grossly intact.   Psychiatric:        Mood and Affect: Mood normal.      ED Treatments / Results  Labs (all labs ordered are listed, but only abnormal results are displayed)  Results for orders placed or performed during the hospital encounter of 09/26/18  CBC  Result Value Ref Range   WBC 9.3 4.0 - 10.5 K/uL   RBC 3.38 (L) 3.87 - 5.11 MIL/uL   Hemoglobin 10.7 (L) 12.0 - 15.0 g/dL   HCT 34.6 (L) 36.0 - 46.0  %   MCV 102.4 (H) 80.0 - 100.0 fL   MCH 31.7 26.0 - 34.0 pg   MCHC 30.9 30.0 - 36.0 g/dL   RDW 13.9 11.5 - 15.5 %   Platelets 184 150 - 400 K/uL   nRBC 0.0 0.0 - 0.2 %  Comprehensive metabolic panel  Result Value Ref Range   Sodium 145 135 - 145 mmol/L   Potassium 3.7 3.5 - 5.1 mmol/L   Chloride 104 98 - 111 mmol/L   CO2 30 22 - 32 mmol/L   Glucose, Bld 123 (H) 70 - 99 mg/dL   BUN 76 (H) 8 - 23 mg/dL   Creatinine, Ser 2.05 (H) 0.44 - 1.00 mg/dL   Calcium 11.3 (H) 8.9 - 10.3 mg/dL   Total Protein 7.0 6.5 - 8.1 g/dL   Albumin 3.8 3.5 - 5.0 g/dL   AST 15 15 - 41 U/L   ALT 14 0 - 44 U/L   Alkaline Phosphatase 85 38 - 126 U/L   Total Bilirubin 0.8 0.3 - 1.2 mg/dL   GFR calc non Af Amer 21 (L) >60 mL/min   GFR calc Af Amer 24 (L) >60 mL/min   Anion gap 11 5 - 15  Urinalysis, Routine w reflex microscopic  Result Value Ref Range   Color, Urine STRAW (A) YELLOW   APPearance CLEAR CLEAR   Specific Gravity, Urine 1.011 1.005 - 1.030   pH 7.0 5.0 - 8.0   Glucose, UA NEGATIVE NEGATIVE mg/dL   Hgb urine dipstick NEGATIVE NEGATIVE   Bilirubin Urine NEGATIVE NEGATIVE   Ketones, ur NEGATIVE NEGATIVE mg/dL   Protein, ur 100 (A) NEGATIVE mg/dL   Nitrite NEGATIVE NEGATIVE   Leukocytes,Ua NEGATIVE NEGATIVE   RBC / HPF 0-5 0 - 5 RBC/hpf   WBC, UA 0-5 0 - 5 WBC/hpf   Bacteria, UA NONE SEEN NONE SEEN   Squamous Epithelial / LPF 0-5 0 - 5   Ct Head Wo Contrast  Result Date: 09/26/2018 CLINICAL DATA:  Head trauma secondary to a fall at home. EXAM: CT HEAD WITHOUT CONTRAST TECHNIQUE: Contiguous axial images were obtained from the base of the skull through the vertex without intravenous contrast. COMPARISON:  CT scan dated 09/22/2017 FINDINGS: Brain: There is no acute intracranial hemorrhage or acute infarction. There are old bilateral occipital lobe infarcts with secondary encephalomalacia. Minimal diffuse atrophy with secondary ventricular dilatation, unchanged. Vascular: No hyperdense vessel or  unexpected calcification. Skull: Normal. Negative for fracture or focal lesion. Sinuses/Orbits: Normal. Other: None IMPRESSION: No acute abnormalities. Old bilateral occipital lobe infarcts. Electronically Signed   By: Lorriane Shire M.D.   On: 09/26/2018 12:41   Dg Shoulder Left  Result Date: 09/26/2018 CLINICAL DATA:  Left shoulder pain and limited range of motion secondary to a fall today. EXAM: LEFT SHOULDER - 2+ VIEW COMPARISON:  Chest x-rays dated 08/15/2017 and 06/24/2017. FINDINGS: There appears to be a slightly impacted fracture of the distal left clavicle. Glenohumeral joint appears normal. No dislocation. IMPRESSION: Fracture of the distal left clavicle. Electronically Signed   By: Lorriane Shire M.D.   On: 09/26/2018 12:37    EKG EKG Interpretation  Date/Time:  Saturday September 26 2018 11:35:01  EDT Ventricular Rate:  60 PR Interval:    QRS Duration: 81 QT Interval:  452 QTC Calculation: 452 R Axis:   57 Text Interpretation:  Sinus rhythm Premature ventricular complexes No significant change since last tracing Confirmed by Lajean Saver [phone]) on 09/26/2018 12:36:03 PM   Radiology Ct Head Wo Contrast  Result Date: 09/26/2018 CLINICAL DATA:  Head trauma secondary to a fall at home. EXAM: CT HEAD WITHOUT CONTRAST TECHNIQUE: Contiguous axial images were obtained from the base of the skull through the vertex without intravenous contrast. COMPARISON:  CT scan dated 09/22/2017 FINDINGS: Brain: There is no acute intracranial hemorrhage or acute infarction. There are old bilateral occipital lobe infarcts with secondary encephalomalacia. Minimal diffuse atrophy with secondary ventricular dilatation, unchanged. Vascular: No hyperdense vessel or unexpected calcification. Skull: Normal. Negative for fracture or focal lesion. Sinuses/Orbits: Normal. Other: None IMPRESSION: No acute abnormalities. Old bilateral occipital lobe infarcts. Electronically Signed   By: Lorriane Shire M.D.   On: 09/26/2018  12:41   Dg Shoulder Left  Result Date: 09/26/2018 CLINICAL DATA:  Left shoulder pain and limited range of motion secondary to a fall today. EXAM: LEFT SHOULDER - 2+ VIEW COMPARISON:  Chest x-rays dated 08/15/2017 and 06/24/2017. FINDINGS: There appears to be a slightly impacted fracture of the distal left clavicle. Glenohumeral joint appears normal. No dislocation. IMPRESSION: Fracture of the distal left clavicle. Electronically Signed   By: Lorriane Shire M.D.   On: 09/26/2018 12:37    Procedures Procedures (including critical care time)  Medications Ordered in ED Medications  sodium chloride 0.9 % bolus 1,000 mL (has no administration in time range)  acetaminophen (TYLENOL) tablet 650 mg (650 mg Oral Given 09/26/18 1255)     Initial Impression / Assessment and Plan / ED Course  I have reviewed the triage vital signs and the nursing notes.  Pertinent labs & imaging results that were available during my care of the patient were reviewed by me and considered in my medical decision making (see chart for details).  Continuous pulse ox and monitor. Ecg. Stat labs.   Imaging studies ordered.  Reviewed nursing notes and prior charts for additional history.   Labs reviewed by me - Ca is high, iv ns bolus. Po fluids. AKI compared to prior labs.   Xrays reviewed by me - +distal clavicle fx - shoulder immobilizer placed. Icepack.  CT reviewed by me - no hem.   At baseline, pt walks slowly w walker. Now has left clavicle fx with limited use of LUE due to pain. In addition, recent n/v, labs c/w dehydration, aki, high Ca. Cause of pts fall also unclear, ?mechanical vs syncope vs orthostasis, etc.   Hospitalists consulted for admission/obs.     Final Clinical Impressions(s) / ED Diagnoses   Final diagnoses:  None    ED Discharge Orders    None       Lajean Saver, MD 09/26/18 1529

## 2018-09-26 NOTE — ED Notes (Signed)
Patient transported to X-ray 

## 2018-09-26 NOTE — Progress Notes (Signed)
CSW spoke with EDP to discuss consult. CSW was informed that pt resides at home with spouse who has dementia. CSW was also informed by EDP that care has been arranged for Pts spouse. TOC team will continue to follow pt for any discharge needs.   [facility]  Clinical Social Worker  Ph: [phone]

## 2018-09-26 NOTE — ED Notes (Signed)
Pt able to tolerate PO fluids , hold NS bolus for now per EDP

## 2018-09-26 NOTE — ED Notes (Signed)
Pt ambulatory slowly  to bathroom with walker and person assist.

## 2018-09-26 NOTE — ED Notes (Signed)
EDP at bedside  

## 2018-09-26 NOTE — ED Triage Notes (Signed)
Pt to ED via EMS. Pt fell at home; reaching for something on her night stand. C/O left shoulder pain. Reports bumped head on night stand c/o left frontal head pain. Per EMS small aprox 1 inch hematoma to left head. No LOC. A&O x 4, not taking blood thinners. Pt ambulatory with walker assist.

## 2018-09-26 NOTE — H&P (Signed)
History and Physical    Sabrina [name] BDZ:[id number] DOB: [birth year] DOA: 09/26/2018  PCP: [name], MD  Patient coming from: home   I have personally briefly reviewed patient's old medical records available.   Chief Complaint: fell at home. Left shoulder pain   HPI: Sabrina [name] is a 85 y.o. female with medical history significant of hypertension, breast cancer, chemotherapy-induced DVT and PE, type 2 diabetes on oral hypoglycemics, hyperlipidemia, chronic debility, stage III chronic kidney disease who presented to the emergency room with fall and left shoulder pain.  According to the patient, she was recently having a hard time helping her husband.  Her husband has dementia and she is having hard time moving him frequently.  They both use a walker at home.  Patient at her phone rang in the bedroom, she went to pick it up, finished in conversation and turned around to go back to living room when she probably entangled on the code and fell on her edge of the table hitting her head and left shoulder.  Patient has left shoulder pain, constant, dull, moderate in intensity, worse with movement and palpation.  No headache, no back pain or neck pain.  No chest pain or shortness of breath.  Patient also has chronic nausea and few episodes of vomiting.  Patient also has poor appetite overall.  No abdominal pain.  Urine and bowel habits are normal. Denies any fever or chills.  Denies any cough congestion or flulike symptoms.  Denies any headache.  Denies any weight loss. ED Course: Hemodynamically stable.  Skeletal survey indicates left lateral clavicle fracture, hemoglobin WBC normal.  Creatinine 2.05, baseline is about 1.79.  BUN is 76.  CT head is normal. Shoulder immobilizer applied to the left hand.  Patient has profound debility, now unable to use left arm, also found to have dehydration with no adequate home support, monitoring and therapies in the hospital recommended.  Review of  Systems: all systems are reviewed and pertinent positive as per HPI otherwise rest are negative.    Past Medical History:  Diagnosis Date  . Arthritis    "mostly in my hands, lower back" (06/25/2017)  . Basal cell carcinoma (BCC) of face 1983  . Breast cancer, right breast ([place]) 03/2013  . Chronic kidney disease (CKD), stage III (moderate) (HCC)    nephrologist, Dr. [name]  . Dental crowns present   . DVT (deep venous thrombosis) ([place]) ~ 06/2013   "? side"  . Gout    "on daily RX" (06/25/2017)  . Heart murmur    no known problems; states did not know she had murmur until age 7  . High cholesterol   . Hypertension    fluctuates, especially when stressed; has been on med. > 20 yr.  . Immature cataract   . Non-insulin dependent type 2 diabetes mellitus ([place])   . Personal history of chemotherapy   . Personal history of radiation therapy   . Pulmonary embolism ([place]) ~ 06/2013  . Radiation 09/06/13-10/20/13   Right Breast Cancer  . Stroke ([place]) 05/2017   just visual problems since (06/25/2017)  . Wears partial dentures    lower    Past Surgical History:  Procedure Laterality Date  . AXILLARY LYMPH NODE DISSECTION Right 03/30/2013   Procedure: AXILLARY LYMPH NODE DISSECTION;  Surgeon: Rolm Bookbinder, MD;  Location: [facility];  Service: General;  Laterality: Right;  . BASAL CELL CARCINOMA EXCISION  1983   "face"  . BREAST BIOPSY Right 03/17/2013  .  BREAST CYST EXCISION Right 11/1958   benign  . BREAST LUMPECTOMY Right 03/30/2013  . BREAST LUMPECTOMY WITH NEEDLE LOCALIZATION Right 03/30/2013   Procedure: BREAST LUMPECTOMY WITH NEEDLE LOCALIZATION;  Surgeon: Rolm Bookbinder, MD;  Location: [facility];  Service: General;  Laterality: Right;  . DILATION AND CURETTAGE OF UTERUS    . LOOP RECORDER INSERTION N/A 08/15/2017   Procedure: LOOP RECORDER INSERTION;  Surgeon: Evans Lance, MD;  Location: [facility];  Service: Cardiovascular;  Laterality: N/A;  . PORT-A-CATH  REMOVAL  2016  . PORTACATH PLACEMENT N/A 04/15/2013   Procedure: INSERTION PORT-A-CATH;  Surgeon: Rolm Bookbinder, MD;  Location: [name];  Service: General;  Laterality: N/A;  . RE-EXCISION OF BREAST CANCER,SUPERIOR MARGINS Right 04/15/2013   Procedure: RE-EXCISION OF RIGHT BREAST  MARGINS;  Surgeon: Rolm Bookbinder, MD;  Location: [facility];  Service: General;  Laterality: Right;  . TONSILLECTOMY  ~ 1935/1936     reports that she has never smoked. She has never used smokeless tobacco. She reports current alcohol use of about 3.0 standard drinks of alcohol per week. She reports that she does not use drugs.  Allergies  Allergen Reactions  . Beta Adrenergic Blockers     Family History  Problem Relation Age of Onset  . Pneumonia Mother   . Heart attack Father   . Breast cancer Other 39       niece  . Breast cancer Other 35       niece  . Ovarian cancer Other        niece     Prior to Admission medications   Medication Sig Start Date End Date Taking? Authorizing Provider  allopurinol (ZYLOPRIM) 100 MG tablet Take 100 mg by mouth daily.  01/17/16   [provider]  aspirin EC 325 MG EC tablet Take 1 tablet (325 mg total) by mouth daily. 06/04/17   Patrecia Pour, MD  cholecalciferol (VITAMIN D) 1000 UNITS tablet Take 1,000 Units by mouth daily.    [provider]  cloNIDine (CATAPRES) 0.2 MG tablet Take 0.2 mg by mouth 2 (two) times daily.    [provider]  denosumab (PROLIA) 60 MG/ML SOSY injection Inject 60 mg into the skin every 6 (six) months.    [provider]  furosemide (LASIX) 40 MG tablet TAKE 40 mg  TABLET EVERY OTHER DAY 06/04/17   Patrecia Pour, MD  gabapentin (NEURONTIN) 300 MG capsule Take 1 capsule (300 mg total) by mouth at bedtime. 01/21/18   Venancio Poisson, NP  glipiZIDE (GLUCOTROL) 5 MG tablet Take 5 mg by mouth 2 (two) times daily before a meal.     [provider]  hydrocortisone  cream 1 % Apply 1 application topically daily as needed for itching.    [provider]  polyethylene glycol (MIRALAX / GLYCOLAX) packet Take 17 g by mouth daily.     [provider]  traMADol (ULTRAM) 50 MG tablet Take 50 mg by mouth 2 (two) times daily as needed for moderate pain.     [provider]  verapamil (VERELAN PM) 180 MG 24 hr capsule Take 180 mg by mouth at bedtime.    [provider]    Physical Exam: Vitals:   09/26/18 1245 09/26/18 1300 09/26/18 1330 09/26/18 1545  BP:  (!) 172/58 (!) 186/67 (!) 166/93  Pulse: 66  67 80  Resp: 16 17 15 19   Temp:      TempSrc:  SpO2: 96%  97% 98%  Weight:      Height:        Constitutional: NAD, calm, comfortable Vitals:   09/26/18 1245 09/26/18 1300 09/26/18 1330 09/26/18 1545  BP:  (!) 172/58 (!) 186/67 (!) 166/93  Pulse: 66  67 80  Resp: 16 17 15 19   Temp:      TempSrc:      SpO2: 96%  97% 98%  Weight:      Height:       Eyes: PERRL, lids and conjunctivae normal ENMT: Mucous membranes are moist. Posterior pharynx clear of any exudate or lesions.Normal dentition.  Neck: normal, supple, no masses, no thyromegaly Respiratory: clear to auscultation bilaterally, no wheezing, no crackles. Normal respiratory effort. No accessory muscle use.  Cardiovascular: Regular rate and rhythm, no murmurs / rubs / gallops. No extremity edema. 2+ pedal pulses. No carotid bruits.  Abdomen: no tenderness, no masses palpated. No hepatosplenomegaly. Bowel sounds positive.  Musculoskeletal: no clubbing / cyanosis.  Pain on palpation with deformity on the left lateral clavicle area.  Shoulder range of motion is normal and limited by pain.  Distal neurovascular status intact.  Good ROM, no contractures. Normal muscle tone.  Skin: no rashes, lesions, ulcers. No induration Neurologic: CN 2-12 grossly intact. Sensation intact, DTR normal. Strength 5/5 in all 4.  Psychiatric: Normal judgment and insight. Alert and  oriented x 3.  Anxious.    Labs on Admission: I have personally reviewed following labs and imaging studies  CBC: Recent Labs  Lab 09/26/18 1244  WBC 9.3  HGB 10.7*  HCT 34.6*  MCV 102.4*  PLT 825   Basic Metabolic Panel: Recent Labs  Lab 09/26/18 1244  NA 145  K 3.7  CL 104  CO2 30  GLUCOSE 123*  BUN 76*  CREATININE 2.05*  CALCIUM 11.3*   GFR: Estimated Creatinine Clearance: 16.4 mL/min (A) (by C-G formula based on SCr of 2.05 mg/dL (H)). Liver Function Tests: Recent Labs  Lab 09/26/18 1244  AST 15  ALT 14  ALKPHOS 85  BILITOT 0.8  PROT 7.0  ALBUMIN 3.8   No results for input(s): LIPASE, AMYLASE in the last 168 hours. No results for input(s): AMMONIA in the last 168 hours. Coagulation Profile: No results for input(s): INR, PROTIME in the last 168 hours. Cardiac Enzymes: No results for input(s): CKTOTAL, CKMB, CKMBINDEX, TROPONINI in the last 168 hours. BNP (last 3 results) No results for input(s): PROBNP in the last 8760 hours. HbA1C: No results for input(s): HGBA1C in the last 72 hours. CBG: No results for input(s): GLUCAP in the last 168 hours. Lipid Profile: No results for input(s): CHOL, HDL, LDLCALC, TRIG, CHOLHDL, LDLDIRECT in the last 72 hours. Thyroid Function Tests: No results for input(s): TSH, T4TOTAL, FREET4, T3FREE, THYROIDAB in the last 72 hours. Anemia Panel: No results for input(s): VITAMINB12, FOLATE, FERRITIN, TIBC, IRON, RETICCTPCT in the last 72 hours. Urine analysis:    Component Value Date/Time   COLORURINE STRAW (A) 09/26/2018 1244   APPEARANCEUR CLEAR 09/26/2018 1244   LABSPEC 1.011 09/26/2018 1244   PHURINE 7.0 09/26/2018 [place] 09/26/2018 [place] 09/26/2018 [place] 09/26/2018 [place] 09/26/2018 1244   PROTEINUR 100 (A) 09/26/2018 1244   UROBILINOGEN 0.2 09/13/2013 0523   NITRITE NEGATIVE 09/26/2018 1244   LEUKOCYTESUR NEGATIVE 09/26/2018 1244     Radiological Exams on Admission: Ct Head Wo Contrast  Result Date: 09/26/2018 CLINICAL DATA:  Head trauma secondary to a fall at home. EXAM: CT HEAD WITHOUT CONTRAST TECHNIQUE: Contiguous axial images were obtained from the base of the skull through the vertex without intravenous contrast. COMPARISON:  CT scan dated 09/22/2017 FINDINGS: Brain: There is no acute intracranial hemorrhage or acute infarction. There are old bilateral occipital lobe infarcts with secondary encephalomalacia. Minimal diffuse atrophy with secondary ventricular dilatation, unchanged. Vascular: No hyperdense vessel or unexpected calcification. Skull: Normal. Negative for fracture or focal lesion. Sinuses/Orbits: Normal. Other: None IMPRESSION: No acute abnormalities. Old bilateral occipital lobe infarcts. Electronically Signed   By: Lorriane Shire M.D.   On: 09/26/2018 12:41   Dg Shoulder Left  Result Date: 09/26/2018 CLINICAL DATA:  Left shoulder pain and limited range of motion secondary to a fall today. EXAM: LEFT SHOULDER - 2+ VIEW COMPARISON:  Chest x-rays dated 08/15/2017 and 06/24/2017. FINDINGS: There appears to be a slightly impacted fracture of the distal left clavicle. Glenohumeral joint appears normal. No dislocation. IMPRESSION: Fracture of the distal left clavicle. Electronically Signed   By: Lorriane Shire M.D.   On: 09/26/2018 12:37    EKG: Independently reviewed.  Sinus rhythm.  No ST-T wave changes.  Comparable to previous EKGs.  Assessment/Plan Principal Problem:   Clavicle fracture Active Problems:   Nausea and vomiting   Fall at home   CKD (chronic kidney disease), stage IV (HCC)   DM (diabetes mellitus) ([place])   Hypertension   History of breast cancer     1.  Fall at home, profound physical deconditioning and clavicular fracture with ambulatory dysfunction, unsafe to discharge. Agree with monitoring in the hospital.  Adequate pain medications.  Left shoulder on immobilizer for 2 weeks and  mobility. Work with PT OT.  May need placement or home with PT OT. Social worker consult, she takes care of her demented husband at home, she may need more support or resources.  2.  Acute kidney injury on CKD stage III: Also possibly progressive kidney disease.  Will hydrate overnight and recheck levels tomorrow morning.  Will check other electrolytes.  3.  Nausea vomiting: Chronic.  Cause unknown.  No obvious organic cause.  Will monitor.  Benign abdomen and normal bowel habits.  4.  Type 2 diabetes on oral hypoglycemics: Fairly controlled as per patient.  Keep on sliding scale insulin while in the hospital.  5.  Hypertension: Blood pressures are stable.  Resume all home medications.    DVT prophylaxis: Lovenox subcu Code Status: Full code Family Communication: None Disposition Plan: To be determined.  Home with home health versus SNF. Consults called: None Admission status: Observation for tonight.   Barb Merino MD [facility] Pager [phone]  If 7PM-7AM, please contact night-coverage [email or web address] Password [facility]  09/26/2018, 4:30 PM

## 2018-09-26 NOTE — ED Notes (Signed)
Patient transported to CT 

## 2018-09-27 DIAGNOSIS — W19XXXA Unspecified fall, initial encounter: Secondary | ICD-10-CM | POA: Diagnosis present

## 2018-09-27 DIAGNOSIS — M19042 Primary osteoarthritis, left hand: Secondary | ICD-10-CM | POA: Diagnosis present

## 2018-09-27 DIAGNOSIS — S42002A Fracture of unspecified part of left clavicle, initial encounter for closed fracture: Secondary | ICD-10-CM | POA: Diagnosis not present

## 2018-09-27 DIAGNOSIS — Z9221 Personal history of antineoplastic chemotherapy: Secondary | ICD-10-CM | POA: Diagnosis not present

## 2018-09-27 DIAGNOSIS — S42032A Displaced fracture of lateral end of left clavicle, initial encounter for closed fracture: Secondary | ICD-10-CM | POA: Diagnosis present

## 2018-09-27 DIAGNOSIS — I5032 Chronic diastolic (congestive) heart failure: Secondary | ICD-10-CM | POA: Diagnosis present

## 2018-09-27 DIAGNOSIS — R296 Repeated falls: Secondary | ICD-10-CM | POA: Diagnosis present

## 2018-09-27 DIAGNOSIS — Z888 Allergy status to other drugs, medicaments and biological substances status: Secondary | ICD-10-CM | POA: Diagnosis not present

## 2018-09-27 DIAGNOSIS — Z8673 Personal history of transient ischemic attack (TIA), and cerebral infarction without residual deficits: Secondary | ICD-10-CM | POA: Diagnosis not present

## 2018-09-27 DIAGNOSIS — M19041 Primary osteoarthritis, right hand: Secondary | ICD-10-CM | POA: Diagnosis present

## 2018-09-27 DIAGNOSIS — S42002D Fracture of unspecified part of left clavicle, subsequent encounter for fracture with routine healing: Secondary | ICD-10-CM | POA: Diagnosis not present

## 2018-09-27 DIAGNOSIS — N184 Chronic kidney disease, stage 4 (severe): Secondary | ICD-10-CM | POA: Diagnosis present

## 2018-09-27 DIAGNOSIS — N179 Acute kidney failure, unspecified: Secondary | ICD-10-CM | POA: Diagnosis present

## 2018-09-27 DIAGNOSIS — R001 Bradycardia, unspecified: Secondary | ICD-10-CM | POA: Diagnosis present

## 2018-09-27 DIAGNOSIS — Z86718 Personal history of other venous thrombosis and embolism: Secondary | ICD-10-CM | POA: Diagnosis not present

## 2018-09-27 DIAGNOSIS — E7800 Pure hypercholesterolemia, unspecified: Secondary | ICD-10-CM | POA: Diagnosis present

## 2018-09-27 DIAGNOSIS — Z20828 Contact with and (suspected) exposure to other viral communicable diseases: Secondary | ICD-10-CM | POA: Diagnosis present

## 2018-09-27 DIAGNOSIS — I10 Essential (primary) hypertension: Secondary | ICD-10-CM | POA: Diagnosis not present

## 2018-09-27 DIAGNOSIS — Z923 Personal history of irradiation: Secondary | ICD-10-CM | POA: Diagnosis not present

## 2018-09-27 DIAGNOSIS — E1122 Type 2 diabetes mellitus with diabetic chronic kidney disease: Secondary | ICD-10-CM | POA: Diagnosis present

## 2018-09-27 DIAGNOSIS — E0822 Diabetes mellitus due to underlying condition with diabetic chronic kidney disease: Secondary | ICD-10-CM | POA: Diagnosis not present

## 2018-09-27 DIAGNOSIS — Z85828 Personal history of other malignant neoplasm of skin: Secondary | ICD-10-CM | POA: Diagnosis not present

## 2018-09-27 DIAGNOSIS — E860 Dehydration: Secondary | ICD-10-CM | POA: Diagnosis present

## 2018-09-27 DIAGNOSIS — R112 Nausea with vomiting, unspecified: Secondary | ICD-10-CM

## 2018-09-27 DIAGNOSIS — S0003XA Contusion of scalp, initial encounter: Secondary | ICD-10-CM | POA: Diagnosis present

## 2018-09-27 DIAGNOSIS — Y92009 Unspecified place in unspecified non-institutional (private) residence as the place of occurrence of the external cause: Secondary | ICD-10-CM

## 2018-09-27 DIAGNOSIS — Z8249 Family history of ischemic heart disease and other diseases of the circulatory system: Secondary | ICD-10-CM | POA: Diagnosis not present

## 2018-09-27 DIAGNOSIS — Z853 Personal history of malignant neoplasm of breast: Secondary | ICD-10-CM | POA: Diagnosis not present

## 2018-09-27 DIAGNOSIS — M109 Gout, unspecified: Secondary | ICD-10-CM | POA: Diagnosis present

## 2018-09-27 DIAGNOSIS — I130 Hypertensive heart and chronic kidney disease with heart failure and stage 1 through stage 4 chronic kidney disease, or unspecified chronic kidney disease: Secondary | ICD-10-CM | POA: Diagnosis present

## 2018-09-27 DIAGNOSIS — Z86711 Personal history of pulmonary embolism: Secondary | ICD-10-CM | POA: Diagnosis not present

## 2018-09-27 LAB — PHOSPHORUS: Phosphorus: 4.2 mg/dL (ref 2.5–4.6)

## 2018-09-27 LAB — BASIC METABOLIC PANEL
Anion gap: 8 (ref 5–15)
BUN: 60 mg/dL — ABNORMAL HIGH (ref 8–23)
CO2: 30 mmol/L (ref 22–32)
Calcium: 10.6 mg/dL — ABNORMAL HIGH (ref 8.9–10.3)
Chloride: 107 mmol/L (ref 98–111)
Creatinine, Ser: 1.87 mg/dL — ABNORMAL HIGH (ref 0.44–1.00)
GFR calc Af Amer: 27 mL/min — ABNORMAL LOW (ref >60)
GFR calc non Af Amer: 23 mL/min — ABNORMAL LOW (ref >60)
Glucose, Bld: 130 mg/dL — ABNORMAL HIGH (ref 70–99)
Potassium: 3.7 mmol/L (ref 3.5–5.1)
Sodium: 145 mmol/L (ref 135–145)

## 2018-09-27 LAB — GLUCOSE, CAPILLARY
Glucose-Capillary: 112 mg/dL — ABNORMAL HIGH (ref 70–99)
Glucose-Capillary: 142 mg/dL — ABNORMAL HIGH (ref 70–99)
Glucose-Capillary: 80 mg/dL (ref 70–99)
Glucose-Capillary: 107 mg/dL — ABNORMAL HIGH (ref 70–99)

## 2018-09-27 LAB — MAGNESIUM: Magnesium: 2.3 mg/dL (ref 1.7–2.4)

## 2018-09-27 NOTE — Progress Notes (Signed)
PROGRESS NOTE    Sabrina [name]  CHY:[id number] DOB: [birth year] DOA: 09/26/2018 PCP: [name], MD   Brief Narrative: 85 y.o. female with medical history significant of hypertension, breast cancer, chemotherapy-induced DVT and PE, type 2 diabetes on oral hypoglycemics, hyperlipidemia, chronic debility, stage III chronic kidney disease who presented to the emergency room with fall and left shoulder pain in ER, Skeletal survey indicates left lateral clavicle fracture, hemoglobin WBC normal.  Creatinine 2.05, baseline is about 1.79.  BUN is 76.  CT head is normal.Shoulder immobilizer applied to the left hand She was found to have profound debility, now unable to use left arm, also found to have dehydration with no adequate home support so was admitted.  She takes care of her elderly demented husband at home.  Subjective Resting on the bedside chair.  Pain is controlled.  Left arm in the sling.  No nausea vomiting dizziness chest pain shortness of breath fever or chills.  Assessment & Plan:   Fall at home with clavicular fracture: Continue splint.cont pt/ot, cont pain control. Profound deconditioning and ambulatory dysfunction:PT OT eval. She is primary care giver for her demented husband.but reports has caregiver at home and in her absence , caregiver will stay more hours. May need further rehab, open to SNF if needed AKI on CKD stage III/IV, baseline creat 1.7-2s.improved to 1.8 from 2.0. Deconditioning: PT OT eval for deconditioning Nausea vomiting chronic, currently stable. Type 2 diabetes mellitus on OHA, sugar controlled continue SSI while here and resume OHA on discharge. Hypertension : BP labile- BP at times upto 190s.Noted intermittent bradycardia in 40s to 50s  And on clonidine and verapamil. Continue meds- monitor and may need to decrease verapamil.  DVT prophylaxis: SCDx Code Status: full Family Communication: plan of care discussed with patient in detail.  Disposition  Plan: Home versus SNF. likely SNF.await PT OT eval.   Expect her stay at least 2 midnight due to need for supervision, presentation with fall, fracture, severe deconditoninig. Monitor and med adjustment.change to inpatient  Consultants:  none Procedures: None Sling left arm Microbiology:  Antimicrobials: Anti-infectives (From admission, onward)   None       Objective: Vitals:   09/27/18 0745 09/27/18 0748 09/27/18 1041 09/27/18 1046  BP: (!) 124/54 (!) 158/59  (!) 114/43  Pulse: 82 94 (!) 48 (!) 49  Resp:    16  Temp:    98 F (36.7 C)  TempSrc:      SpO2:    92%  Weight:      Height:        Intake/Output Summary (Last 24 hours) at 09/27/2018 1205 Last data filed at 09/27/2018 0806 Gross per 24 hour  Intake 1554.2 ml  Output -  Net 1554.2 ml   Filed Weights   09/26/18 1031  Weight: 74.8 kg   Weight change:   Body mass index is 33.33 kg/m.  Intake/Output from previous day: 07/25 0701 - 07/26 0700 In: 1354.2 [IV Piggyback:1354.2] Out: -  Intake/Output this shift: Total I/O In: 200 [P.O.:200] Out: -   Examination:  General exam: Appears calm and comfortable,Not in distress, older fore the age HEENT:PERRL,Oral mucosa moist, Ear/Nose normal on gross exam Respiratory system: Bilateral equal air entry, normal vesicular breath sounds, no wheezes or crackles  Cardiovascular system: S1 & S2 heard,No JVD, murmurs. Gastrointestinal system: Abdomen is  soft, non tender, non distended, BS +  Nervous System:Alert and oriented. No focal neurological deficits/moving extremities, sensation intact. Extremities: No edema, no clubbing,  distal peripheral pulses palpable. Skin: No rashes, lesions, no icterus MSK: Normal muscle bulk,tone ,power  Medications:  Scheduled Meds: . allopurinol  100 mg Oral Daily  . aspirin  325 mg Oral Daily  . cloNIDine  0.2 mg Oral BID  . enoxaparin (LOVENOX) injection  30 mg Subcutaneous Q24H  . gabapentin  200 mg Oral BID WC  .  gabapentin  300 mg Oral QHS  . insulin aspart  0-5 Units Subcutaneous QHS  . insulin aspart  0-9 Units Subcutaneous TID WC  . polyethylene glycol  17 g Oral Daily  . verapamil  180 mg Oral QHS   Continuous Infusions: . sodium chloride 75 mL/hr at 09/27/18 3614    Data Reviewed: I have personally reviewed following labs and imaging studies  CBC: Recent Labs  Lab 09/26/18 1244  WBC 9.3  HGB 10.7*  HCT 34.6*  MCV 102.4*  PLT 431   Basic Metabolic Panel: Recent Labs  Lab 09/26/18 1244 09/27/18 0533  NA 145 145  K 3.7 3.7  CL 104 107  CO2 30 30  GLUCOSE 123* 130*  BUN 76* 60*  CREATININE 2.05* 1.87*  CALCIUM 11.3* 10.6*  MG  --  2.3  PHOS  --  4.2   GFR: Estimated Creatinine Clearance: 18 mL/min (A) (by C-G formula based on SCr of 1.87 mg/dL (H)). Liver Function Tests: Recent Labs  Lab 09/26/18 1244  AST 15  ALT 14  ALKPHOS 85  BILITOT 0.8  PROT 7.0  ALBUMIN 3.8   No results for input(s): LIPASE, AMYLASE in the last 168 hours. No results for input(s): AMMONIA in the last 168 hours. Coagulation Profile: No results for input(s): INR, PROTIME in the last 168 hours. Cardiac Enzymes: No results for input(s): CKTOTAL, CKMB, CKMBINDEX, TROPONINI in the last 168 hours. BNP (last 3 results) No results for input(s): PROBNP in the last 8760 hours. HbA1C: No results for input(s): HGBA1C in the last 72 hours. CBG: Recent Labs  Lab 09/26/18 1924 09/26/18 2204 09/27/18 0730 09/27/18 1202  GLUCAP 144* 187* 112* 142*   Lipid Profile: No results for input(s): CHOL, HDL, LDLCALC, TRIG, CHOLHDL, LDLDIRECT in the last 72 hours. Thyroid Function Tests: No results for input(s): TSH, T4TOTAL, FREET4, T3FREE, THYROIDAB in the last 72 hours. Anemia Panel: No results for input(s): VITAMINB12, FOLATE, FERRITIN, TIBC, IRON, RETICCTPCT in the last 72 hours. Sepsis Labs: No results for input(s): PROCALCITON, LATICACIDVEN in the last 168 hours.  Recent Results (from the past  240 hour(s))  SARS Coronavirus 2 (CEPHEID - Performed in [facility] hospital lab), Hosp Order     Status: None   Collection Time: 09/26/18  3:32 PM   Specimen: Nasopharyngeal Swab  Result Value Ref Range Status   SARS Coronavirus 2 NEGATIVE NEGATIVE Final    Comment: (NOTE) If result is NEGATIVE SARS-CoV-2 target nucleic acids are NOT DETECTED. The SARS-CoV-2 RNA is generally detectable in upper and lower  respiratory specimens during the acute phase of infection. The lowest  concentration of SARS-CoV-2 viral copies this assay can detect is 250  copies / mL. A negative result does not preclude SARS-CoV-2 infection  and should not be used as the sole basis for treatment or other  patient management decisions.  A negative result may occur with  improper specimen collection / handling, submission of specimen other  than nasopharyngeal swab, presence of viral mutation(s) within the  areas targeted by this assay, and inadequate number of viral copies  (<250 copies / mL). A  negative result must be combined with clinical  observations, patient history, and epidemiological information. If result is POSITIVE SARS-CoV-2 target nucleic acids are DETECTED. The SARS-CoV-2 RNA is generally detectable in upper and lower  respiratory specimens dur ing the acute phase of infection.  Positive  results are indicative of active infection with SARS-CoV-2.  Clinical  correlation with patient history and other diagnostic information is  necessary to determine patient infection status.  Positive results do  not rule out bacterial infection or co-infection with other viruses. If result is PRESUMPTIVE POSTIVE SARS-CoV-2 nucleic acids MAY BE PRESENT.   A presumptive positive result was obtained on the submitted specimen  and confirmed on repeat testing.  While 2019 novel coronavirus  (SARS-CoV-2) nucleic acids may be present in the submitted sample  additional confirmatory testing may be necessary for  epidemiological  and / or clinical management purposes  to differentiate between  SARS-CoV-2 and other Sarbecovirus currently known to infect humans.  If clinically indicated additional testing with an alternate test  methodology [id number]) is advised. The SARS-CoV-2 RNA is generally  detectable in upper and lower respiratory sp ecimens during the acute  phase of infection. The expected result is Negative. Fact Sheet for Patients:  StrictlyIdeas.no Fact Sheet for Healthcare Providers: BankingDealers.co.za This test is not yet approved or cleared by the [place] FDA and has been authorized for detection and/or diagnosis of SARS-CoV-2 by FDA under an Emergency Use Authorization (EUA).  This EUA will remain in effect (meaning this test can be used) for the duration of the COVID-19 declaration under Section 564(b)(1) of the Act, 21 U.S.C. section 360bbb-3(b)(1), unless the authorization is terminated or revoked sooner. Performed at [facility], [place] [address], [place], [place] [address]       Radiology Studies: Ct Head Wo Contrast  Result Date: 09/26/2018 CLINICAL DATA:  Head trauma secondary to a fall at home. EXAM: CT HEAD WITHOUT CONTRAST TECHNIQUE: Contiguous axial images were obtained from the base of the skull through the vertex without intravenous contrast. COMPARISON:  CT scan dated 09/22/2017 FINDINGS: Brain: There is no acute intracranial hemorrhage or acute infarction. There are old bilateral occipital lobe infarcts with secondary encephalomalacia. Minimal diffuse atrophy with secondary ventricular dilatation, unchanged. Vascular: No hyperdense vessel or unexpected calcification. Skull: Normal. Negative for fracture or focal lesion. Sinuses/Orbits: Normal. Other: None IMPRESSION: No acute abnormalities. Old bilateral occipital lobe infarcts. Electronically Signed   By: Lorriane Shire M.D.   On: 09/26/2018 12:41    Dg Shoulder Left  Result Date: 09/26/2018 CLINICAL DATA:  Left shoulder pain and limited range of motion secondary to a fall today. EXAM: LEFT SHOULDER - 2+ VIEW COMPARISON:  Chest x-rays dated 08/15/2017 and 06/24/2017. FINDINGS: There appears to be a slightly impacted fracture of the distal left clavicle. Glenohumeral joint appears normal. No dislocation. IMPRESSION: Fracture of the distal left clavicle. Electronically Signed   By: Lorriane Shire M.D.   On: 09/26/2018 12:37      LOS: 0 days   Time spent: More than 50% of that time was spent in counseling and/or coordination of care.  Antonieta Pert, MD [facility]  09/27/2018, 12:05 PM

## 2018-09-27 NOTE — Progress Notes (Signed)
Pt complaining of pain at their IV site. On assessment a red rash is present on the top of the hand extending from the wrist distally to fingertips. IV was flushed with blood return noted. The hand is not edematous and there is no sign leaking or infiltration. Pt has only been receiving maintainence fluids since admission and is in no acute distress. Will continue to monitor pt and IV site.

## 2018-09-27 NOTE — Evaluation (Addendum)
Occupational Therapy Evaluation Patient Details Name: Sabrina [name] MRN: [id number] DOB: 04-04-1926 Today's Date: 09/27/2018    History of Present Illness 85 y.o. female with medical history significant of hypertension, breast cancer, chemotherapy induced DVT and PE, type 2 diabetes on oral hypoglycemics, hyperlipidemia, chronic debility, stage III chronic kidney disease who presented to the emergency room with fall and left shoulder pain in ER. Skeletal survey indicates left lateral clavicle fracture. Shoulder immobilizer applied to the LUE   Clinical Impression   Pt admitted with above diagnoses, pain and general debility limiting ability to complete BADL at desired level of ind. PTA, pt lives with husband with dementia and they both have caregivers ~8 hours a day for BADL/IADL support. At time of eval, pt is min-mod A +2 for sit > stand transfers from chair and BSC and min-mod A+2 for bed mobility. Pt sling repositioned and applied, needing total A for sling management.  When using BSC, pt needing total A for hygiene after BM. At this time, recommend pt need SNF level of care at d/c for safe BADL progression. If this is refused by pt, she will need 24/7 physical assist when returning home. Will continue to follow while acute.    Follow Up Recommendations  SNF;Supervision/Assistance - 24 hour    Equipment Recommendations  Other (comment);3 in 1 bedside commode(if pt denies SNF)    Recommendations for Other Services       Precautions / Restrictions Precautions Precautions: Fall Required Braces or Orthoses: Sling Restrictions Weight Bearing Restrictions: Yes LUE Weight Bearing: Non weight bearing      Mobility Bed Mobility Overal bed mobility: Needs Assistance Bed Mobility: Sit to Supine       Sit to supine: Min assist;Mod assist;+2 for safety/equipment;+2 for physical assistance   General bed mobility comments: assist at trunk and at BLEs to slide on bed pad to  supine  Transfers Overall transfer level: Needs assistance Equipment used: Rolling walker (2 wheeled) Transfers: Sit to/from Stand Sit to Stand: Min assist;Mod assist;+2 safety/equipment;+2 physical assistance         General transfer comment: to power to standing and use quad cane; assist also needed to maintain standing    Balance Overall balance assessment: Needs assistance Sitting-balance support: Bilateral upper extremity supported;Single extremity supported Sitting balance-Leahy Scale: Good     Standing balance support: Single extremity supported;Bilateral upper extremity supported;During functional activity Standing balance-Leahy Scale: Poor                             ADL either performed or assessed with clinical judgement   ADL Overall ADL's : Needs assistance/impaired Eating/Feeding: Minimal assistance;Sitting Eating/Feeding Details (indicate cue type and reason): package management Grooming: Minimal assistance;Sitting Grooming Details (indicate cue type and reason): package management Upper Body Bathing: Maximal assistance;Adhering to UE precautions;Sitting   Lower Body Bathing: Total assistance;Cueing for compensatory techniques;Sitting/lateral leans;Sit to/from stand   Upper Body Dressing : Maximal assistance;Adhering to UE precautions;Cueing for UE precautions;Cueing for compensatory techniques;Sitting   Lower Body Dressing: Total assistance;Sit to/from stand;Sitting/lateral leans;Cueing for compensatory techniques   Toilet Transfer: Minimal assistance;Moderate assistance;+2 for physical assistance;+2 for safety/equipment;BSC(QC) Toilet Transfer Details (indicate cue type and reason): assist to stand and then assist for peri area Toileting- Clothing Manipulation and Hygiene: Total assistance;Sit to/from stand Toileting - Clothing Manipulation Details (indicate cue type and reason): mod A to stand and steady, total A for act of clean up after  BM Tub/ Shower  Transfer: Maximal assistance;Shower seat;Ambulation;Rolling walker   Functional mobility during ADLs: Minimal assistance;Moderate assistance;+2 for physical assistance;+2 for safety/equipment(quad cane) General ADL Comments: pt needing more significant assist to manage with LUE     Vision Baseline Vision/History: Wears glasses Wears Glasses: At all times Patient Visual Report: No change from baseline       Perception     Praxis      Pertinent Vitals/Pain Pain Assessment: Faces Faces Pain Scale: Hurts a little bit Pain Location: L clavicle Pain Descriptors / Indicators: Sore Pain Intervention(s): Limited activity within patient's tolerance;Monitored during session;Repositioned     Hand Dominance     Extremity/Trunk Assessment Upper Extremity Assessment Upper Extremity Assessment: Generalized weakness;LUE deficits/detail LUE Deficits / Details: in sling with waist strap LUE: Unable to fully assess due to immobilization;Unable to fully assess due to pain   Lower Extremity Assessment Lower Extremity Assessment: Defer to PT evaluation       Communication Communication Communication: No difficulties   Cognition Arousal/Alertness: Awake/alert Behavior During Therapy: WFL for tasks assessed/performed Overall Cognitive Status: No family/caregiver present to determine baseline cognitive functioning                                 General Comments: pt is tangential, slow to respond and needing cues to stay on task   General Comments       Exercises     Shoulder Instructions      Home Living Family/patient expects to be discharged to:: Private residence Living Arrangements: Spouse/significant other(husband with dementia) Available Help at Discharge: Personal care attendant Type of Home: House Home Access: Stairs to enter [employer] of Steps: 7 in front; 2 in back   Home Layout: One level     Bathroom Shower/Tub: Emergency planning/management officer: Standard     Home Equipment: Environmental consultant - 2 wheels;Shower seat;Grab bars - tub/shower   Additional Comments: caregivers from 10-2, 5-7 every day      Prior Functioning/Environment Level of Independence: Needs assistance  Gait / Transfers Assistance Needed: uses RW for functional mobility ADL's / Homemaking Assistance Needed: caregiver assist with bathing, dressing, home maintaining and cooking            OT Problem List: Decreased strength;Decreased knowledge of use of DME or AE;Decreased range of motion;Decreased coordination;Decreased knowledge of precautions;Obesity;Decreased activity tolerance;Impaired UE functional use;Impaired balance (sitting and/or standing);Pain      OT Treatment/Interventions: Self-care/ADL training;Therapeutic exercise;Patient/family education;Balance training;Energy conservation;Therapeutic activities;DME and/or AE instruction;Cognitive remediation/compensation    OT Goals(Current goals can be found in the care plan section) Acute Rehab OT Goals Patient Stated Goal: to go home OT Goal Formulation: With patient Time For Goal Achievement: 10/11/18 Potential to Achieve Goals: Good  OT Frequency: Min 2X/week   Barriers to D/C:            Co-evaluation PT/OT/SLP Co-Evaluation/Treatment: Yes Reason for Co-Treatment: Complexity of the patient's impairments (multi-system involvement);For patient/therapist safety;To address functional/ADL transfers PT goals addressed during session: Mobility/safety with mobility;Proper use of DME OT goals addressed during session: ADL's and self-care      AM-PAC OT "6 Clicks" Daily Activity     Outcome Measure Help from another person eating meals?: A Little Help from another person taking care of personal grooming?: A Little Help from another person toileting, which includes using toliet, bedpan, or urinal?: A Lot Help from another person bathing (including washing, rinsing, drying)?: A  Lot Help  from another person to put on and taking off regular upper body clothing?: A Lot Help from another person to put on and taking off regular lower body clothing?: A Lot 6 Click Score: 14   End of Session Equipment Utilized During Treatment: Gait belt;Other (comment)(quad cane; LUE sling) Nurse Communication: Mobility status  Activity Tolerance: Patient tolerated treatment well Patient left: in bed;with call bell/phone within reach;with bed alarm set  OT Visit Diagnosis: Muscle weakness (generalized) (M62.81);Other abnormalities of gait and mobility (R26.89);Unsteadiness on feet (R26.81);History of falling (Z91.81);Pain Pain - Right/Left: Left Pain - part of body: Arm                Time: 3244-0102 OT Time Calculation (min): 51 min Charges:  OT General Charges $OT Visit: 1 Visit OT Evaluation $OT Eval Moderate Complexity: 1 Mod OT Treatments $Self Care/Home Management : 8-22 mins  Zenovia Jarred, MSOT, OTR/L Behavioral Health OT/ Acute Relief OT [facility] Office: [phone]  Zenovia Jarred 09/27/2018, 4:06 PM

## 2018-09-27 NOTE — Evaluation (Signed)
Physical Therapy Evaluation Patient Details Name: Sabrina [name] MRN: [id number] DOB: 1926/12/15 Today's Date: 09/27/2018   History of Present Illness  85 y.o. female with medical history significant of hypertension, breast cancer, chemotherapy induced DVT and PE, type 2 diabetes on oral hypoglycemics, hyperlipidemia, chronic debility, stage III chronic kidney disease who presented to the emergency room with fall and left shoulder pain in ER. Skeletal survey indicates left lateral clavicle fracture. Shoulder immobilizer applied to the LUE  Clinical Impression  Pt admitted as above and presenting with functional mobility limitations 2* generalized weakness/premorbid deconditioning, obesity, balance deficits, and NWB on L UE.  Pt would benefit from follow up rehab at SNF level to maximize IND and safety prior to return home with limited assist.    Follow Up Recommendations SNF    Equipment Recommendations  None recommended by PT    Recommendations for Other Services       Precautions / Restrictions Precautions Precautions: Fall Required Braces or Orthoses: Sling Restrictions Weight Bearing Restrictions: Yes LUE Weight Bearing: Non weight bearing      Mobility  Bed Mobility Overal bed mobility: Needs Assistance Bed Mobility: Sit to Supine       Sit to supine: Min assist;Mod assist;+2 for safety/equipment;+2 for physical assistance   General bed mobility comments: assist at trunk and at BLEs to slide on bed pad to supine  Transfers Overall transfer level: Needs assistance Equipment used: Rolling walker (2 wheeled) Transfers: Sit to/from Stand Sit to Stand: Min assist;Mod assist;+2 safety/equipment;+2 physical assistance         General transfer comment: to power to standing and use quad cane; assist also needed to maintain standing  Ambulation/Gait Ambulation/Gait assistance: Min assist;Mod assist;+2 safety/equipment Gait Distance (Feet): 14 Feet Assistive device:  Quad cane Gait Pattern/deviations: Step-to pattern;Decreased step length - right;Decreased step length - left;Shuffle;Trunk flexed Gait velocity: decr   General Gait Details: cues for sequence and foot/cane placement.  Physical assist to balance and to manage [facility]  Stairs            Wheelchair Mobility    Modified Rankin (Stroke Patients Only)       Balance Overall balance assessment: Needs assistance Sitting-balance support: Bilateral upper extremity supported;Single extremity supported Sitting balance-Leahy Scale: Good     Standing balance support: Single extremity supported;Bilateral upper extremity supported;During functional activity Standing balance-Leahy Scale: Poor                               Pertinent Vitals/Pain Pain Assessment: Faces Faces Pain Scale: Hurts a little bit Pain Location: L clavicle Pain Descriptors / Indicators: Sore Pain Intervention(s): Limited activity within patient's tolerance;Monitored during session;Repositioned    Home Living Family/patient expects to be discharged to:: Private residence Living Arrangements: Spouse/significant other Available Help at Discharge: Personal care attendant Type of Home: House Home Access: Stairs to enter Entrance Stairs-Rails: None Entrance Stairs-Number of Steps: 7 in front; 2 in back Home Layout: One level Home Equipment: Walker - 2 wheels;Shower seat;Grab bars - tub/shower Additional Comments: caregivers for pt and spouse (spouse has dementia) from 10-2, 5-7 every day    Prior Function Level of Independence: Needs assistance   Gait / Transfers Assistance Needed: uses RW for functional mobility  ADL's / Homemaking Assistance Needed: caregiver assist with bathing, dressing, home maintaining and cooking        Hand Dominance   Dominant Hand: Right    Extremity/Trunk Assessment  Upper Extremity Assessment Upper Extremity Assessment: LUE deficits/detail;Generalized  weakness LUE Deficits / Details: in sling with waist strap LUE: Unable to fully assess due to immobilization;Unable to fully assess due to pain    Lower Extremity Assessment Lower Extremity Assessment: Generalized weakness    Cervical / Trunk Assessment Cervical / Trunk Assessment: Kyphotic  Communication   Communication: No difficulties  Cognition Arousal/Alertness: Awake/alert Behavior During Therapy: WFL for tasks assessed/performed Overall Cognitive Status: No family/caregiver present to determine baseline cognitive functioning                                 General Comments: pt is tangential, slow to respond and needing cues to stay on task      General Comments      Exercises     Assessment/Plan    PT Assessment Patient needs continued PT services  PT Problem List Decreased strength;Decreased activity tolerance;Decreased balance;Decreased mobility;Decreased knowledge of use of DME;Obesity;Pain       PT Treatment Interventions DME instruction;Gait training;Functional mobility training;Therapeutic activities;Therapeutic exercise;Patient/family education;Balance training    PT Goals (Current goals can be found in the Care Plan section)  Acute Rehab PT Goals Patient Stated Goal: to go home PT Goal Formulation: With patient Time For Goal Achievement: 10/11/18 Potential to Achieve Goals: Fair    Frequency Min 3X/week   Barriers to discharge Decreased caregiver support      Co-evaluation PT/OT/SLP Co-Evaluation/Treatment: Yes Reason for Co-Treatment: Complexity of the patient's impairments (multi-system involvement);For patient/therapist safety PT goals addressed during session: Mobility/safety with mobility OT goals addressed during session: ADL's and self-care       AM-PAC PT "6 Clicks" Mobility  Outcome Measure Help needed turning from your back to your side while in a flat bed without using bedrails?: A Lot Help needed moving from lying on  your back to sitting on the side of a flat bed without using bedrails?: A Lot Help needed moving to and from a bed to a chair (including a wheelchair)?: A Lot Help needed standing up from a chair using your arms (e.g., wheelchair or bedside chair)?: A Lot Help needed to walk in hospital room?: A Lot Help needed climbing 3-5 steps with a railing? : A Lot 6 Click Score: 12    End of Session Equipment Utilized During Treatment: Gait belt;Other (comment)(arm sling) Activity Tolerance: Patient tolerated treatment well;Patient limited by fatigue Patient left: in bed;with call bell/phone within reach;with bed alarm set Nurse Communication: Mobility status PT Visit Diagnosis: Muscle weakness (generalized) (M62.81);History of falling (Z91.81);Difficulty in walking, not elsewhere classified (R26.2);Unsteadiness on feet (R26.81)    Time: 5053-9767 PT Time Calculation (min) (ACUTE ONLY): 51 min   Charges:   PT Evaluation $PT Eval Moderate Complexity: [place] [facility] Acute Rehabilitation Services Pager [phone] Office [phone]   Andrik Sandt 09/27/2018, 5:16 PM

## 2018-09-28 LAB — GLUCOSE, CAPILLARY
Glucose-Capillary: 122 mg/dL — ABNORMAL HIGH (ref 70–99)
Glucose-Capillary: 146 mg/dL — ABNORMAL HIGH (ref 70–99)
Glucose-Capillary: 106 mg/dL — ABNORMAL HIGH (ref 70–99)
Glucose-Capillary: 113 mg/dL — ABNORMAL HIGH (ref 70–99)

## 2018-09-28 NOTE — Progress Notes (Signed)
PROGRESS NOTE    Sabrina [name]  MWU:[id number] DOB: 01-30-1927 DOA: 09/26/2018 PCP: [name], MD   Brief Narrative: 85 y.o. female with medical history significant of hypertension, breast cancer, chemotherapy-induced DVT and PE, type 2 diabetes on oral hypoglycemics, hyperlipidemia, chronic debility, stage III chronic kidney disease who presented to the emergency room with fall and left shoulder pain in ER, Skeletal survey indicates left lateral clavicle fracture, hemoglobin WBC normal.  Creatinine 2.05, baseline is about 1.79.  BUN is 76.  CT head is normal.Shoulder immobilizer applied to the left hand She was found to have profound debility, now unable to use left arm, also found to have dehydration with no adequate home support so was admitted.  She takes care of her elderly demented husband at home. Seen by PT OT and SNF has been recommended.  Subjective Resting on bed to have her meal this morning.  Pain is fairly stable on her shoulder.  Understand that she needs to go to skilled nursing facility but somewhat tearful thinking about her demented husband at home.  Blood pressure soft and heart rate was in 40s   Assessment & Plan:   Fall at home with clavicular fracture: Continue splint.cont pt/ot, cont pain control.  Profound deconditioning and ambulatory dysfunction:PT OT eval. She is primary care giver for her demented husband.but reports has caregiver at home and in her absence , caregiver will stay more hours.  Seen by PT OT and will need a skilled nursing facility placement.   AKI on CKD stage III/IV, baseline creat 1.7-2s.improved to 1.8 from 2.0.  Deconditioning: PT OT eval for deconditioning  Nausea vomiting chronic, currently stable. History of cryptogenic stroke which she had recovered from, aspirin and Lipitor  Type 2 diabetes mellitus on OHA, sugar controlled continue SSI while here and resume OHA on discharge.  Hypertension : BP labile.Blood pressure soft  and heart rate was in 40s- hold clonidine, stop verapamil.  Has had intermittent bradycardia in 40s to 50s-normally takes clonidine-added holding parameters and stopped verapamil for now. Is known to cardiology group.  She has had home cardaic monitor in jan feb 2020. She had had bradycardia. Can consider alternate medication- amlodipine/hyrdalazine arbs or d.w cardio- if blood pressure remains high.   History of cryptogenic stroke, on aspirin 325. Her Lipitor was recently discontinued back in June due to not feeling well  DVT prophylaxis: SCDx Code Status: full Family Communication: plan of care discussed with patient in detail.  Disposition Plan: SNF is being planned.  Remains inpatient  Consultants:  none Procedures: None Sling left arm Microbiology:  Antimicrobials: Anti-infectives (From admission, onward)   None       Objective: Vitals:   09/27/18 1608 09/27/18 2110 09/28/18 0643 09/28/18 0820  BP: (!) 142/47 (!) 157/57 (!) 121/45 (!) 109/42  Pulse: 63 72 (!) 42 (!) 40  Resp: 20 19 18    Temp: 98.4 F (36.9 C) 98.5 F (36.9 C) 98.4 F (36.9 C)   TempSrc: Oral Oral Oral   SpO2: 96% 92%    Weight:      Height:        Intake/Output Summary (Last 24 hours) at 09/28/2018 1002 Last data filed at 09/28/2018 0600 Gross per 24 hour  Intake 900 ml  Output 0 ml  Net 900 ml   Filed Weights   09/26/18 1031  Weight: 74.8 kg   Weight change:   Body mass index is 33.33 kg/m.  Intake/Output from previous day: 07/26 0701 - 07/27  0700 In: 1100 [P.O.:200; I.V.:900] Out: 0  Intake/Output this shift: No intake/output data recorded.  Examination:  General exam: Appears calm and comfortable, tearful, not in acute distress, elderly and frail.   HEENT:PERRL,Oral mucosa moist, Ear/Nose normal on gross exam. Respiratory system: Bilateral equal air entry, normal vesicular breath sounds, no wheezes or crackles. Cardiovascular system: S1 & S2 heard,No JVD, murmurs.  Gastrointestinal system: Abdomen is  soft, non tender, non distended, BS+.  Nervous System:Alert and oriented. No focal neurological deficits/moving extremities, sensation intact. Extremities: Left shoulder in sling, no edema distal pulses intact.   Skin: No rashes, lesions, no icterus. MSK: Normal muscle bulk,tone ,power  Medications:  Scheduled Meds: . allopurinol  100 mg Oral Daily  . aspirin  325 mg Oral Daily  . cloNIDine  0.2 mg Oral BID  . enoxaparin (LOVENOX) injection  30 mg Subcutaneous Q24H  . gabapentin  200 mg Oral BID WC  . gabapentin  300 mg Oral QHS  . insulin aspart  0-5 Units Subcutaneous QHS  . insulin aspart  0-9 Units Subcutaneous TID WC  . polyethylene glycol  17 g Oral Daily   Continuous Infusions: . sodium chloride 75 mL/hr at 09/28/18 0831    Data Reviewed: I have personally reviewed following labs and imaging studies  CBC: Recent Labs  Lab 09/26/18 1244  WBC 9.3  HGB 10.7*  HCT 34.6*  MCV 102.4*  PLT 937   Basic Metabolic Panel: Recent Labs  Lab 09/26/18 1244 09/27/18 0533  NA 145 145  K 3.7 3.7  CL 104 107  CO2 30 30  GLUCOSE 123* 130*  BUN 76* 60*  CREATININE 2.05* 1.87*  CALCIUM 11.3* 10.6*  MG  --  2.3  PHOS  --  4.2   GFR: Estimated Creatinine Clearance: 18 mL/min (A) (by C-G formula based on SCr of 1.87 mg/dL (H)). Liver Function Tests: Recent Labs  Lab 09/26/18 1244  AST 15  ALT 14  ALKPHOS 85  BILITOT 0.8  PROT 7.0  ALBUMIN 3.8   No results for input(s): LIPASE, AMYLASE in the last 168 hours. No results for input(s): AMMONIA in the last 168 hours. Coagulation Profile: No results for input(s): INR, PROTIME in the last 168 hours. Cardiac Enzymes: No results for input(s): CKTOTAL, CKMB, CKMBINDEX, TROPONINI in the last 168 hours. BNP (last 3 results) No results for input(s): PROBNP in the last 8760 hours. HbA1C: No results for input(s): HGBA1C in the last 72 hours. CBG: Recent Labs  Lab 09/27/18 0730 09/27/18  1202 09/27/18 1632 09/27/18 2110 09/28/18 0722  GLUCAP 112* 142* 80 107* 122*   Lipid Profile: No results for input(s): CHOL, HDL, LDLCALC, TRIG, CHOLHDL, LDLDIRECT in the last 72 hours. Thyroid Function Tests: No results for input(s): TSH, T4TOTAL, FREET4, T3FREE, THYROIDAB in the last 72 hours. Anemia Panel: No results for input(s): VITAMINB12, FOLATE, FERRITIN, TIBC, IRON, RETICCTPCT in the last 72 hours. Sepsis Labs: No results for input(s): PROCALCITON, LATICACIDVEN in the last 168 hours.  Recent Results (from the past 240 hour(s))  SARS Coronavirus 2 (CEPHEID - Performed in [place] hospital lab), Hosp Order     Status: None   Collection Time: 09/26/18  3:32 PM   Specimen: Nasopharyngeal Swab  Result Value Ref Range Status   SARS Coronavirus 2 NEGATIVE NEGATIVE Final    Comment: (NOTE) If result is NEGATIVE SARS-CoV-2 target nucleic acids are NOT DETECTED. The SARS-CoV-2 RNA is generally detectable in upper and lower  respiratory specimens during the acute phase of  infection. The lowest  concentration of SARS-CoV-2 viral copies this assay can detect is 250  copies / mL. A negative result does not preclude SARS-CoV-2 infection  and should not be used as the sole basis for treatment or other  patient management decisions.  A negative result may occur with  improper specimen collection / handling, submission of specimen other  than nasopharyngeal swab, presence of viral mutation(s) within the  areas targeted by this assay, and inadequate number of viral copies  (<250 copies / mL). A negative result must be combined with clinical  observations, patient history, and epidemiological information. If result is POSITIVE SARS-CoV-2 target nucleic acids are DETECTED. The SARS-CoV-2 RNA is generally detectable in upper and lower  respiratory specimens dur ing the acute phase of infection.  Positive  results are indicative of active infection with SARS-CoV-2.  Clinical   correlation with patient history and other diagnostic information is  necessary to determine patient infection status.  Positive results do  not rule out bacterial infection or co-infection with other viruses. If result is PRESUMPTIVE POSTIVE SARS-CoV-2 nucleic acids MAY BE PRESENT.   A presumptive positive result was obtained on the submitted specimen  and confirmed on repeat testing.  While 2019 novel coronavirus  (SARS-CoV-2) nucleic acids may be present in the submitted sample  additional confirmatory testing may be necessary for epidemiological  and / or clinical management purposes  to differentiate between  SARS-CoV-2 and other Sarbecovirus currently known to infect humans.  If clinically indicated additional testing with an alternate test  methodology [phone]) is advised. The SARS-CoV-2 RNA is generally  detectable in upper and lower respiratory sp ecimens during the acute  phase of infection. The expected result is Negative. Fact Sheet for Patients:  StrictlyIdeas.no Fact Sheet for Healthcare Providers: BankingDealers.co.za This test is not yet approved or cleared by the [place] FDA and has been authorized for detection and/or diagnosis of SARS-CoV-2 by FDA under an Emergency Use Authorization (EUA).  This EUA will remain in effect (meaning this test can be used) for the duration of the COVID-19 declaration under Section 564(b)(1) of the Act, 21 U.S.C. section 360bbb-3(b)(1), unless the authorization is terminated or revoked sooner. Performed at [facility], [place] [address] [place],  [address]       Radiology Studies: Ct Head Wo Contrast  Result Date: 09/26/2018 CLINICAL DATA:  Head trauma secondary to a fall at home. EXAM: CT HEAD WITHOUT CONTRAST TECHNIQUE: Contiguous axial images were obtained from the base of the skull through the vertex without intravenous contrast. COMPARISON:  CT scan  dated 09/22/2017 FINDINGS: Brain: There is no acute intracranial hemorrhage or acute infarction. There are old bilateral occipital lobe infarcts with secondary encephalomalacia. Minimal diffuse atrophy with secondary ventricular dilatation, unchanged. Vascular: No hyperdense vessel or unexpected calcification. Skull: Normal. Negative for fracture or focal lesion. Sinuses/Orbits: Normal. Other: None IMPRESSION: No acute abnormalities. Old bilateral occipital lobe infarcts. Electronically Signed   By: Lorriane Shire M.D.   On: 09/26/2018 12:41   Dg Shoulder Left  Result Date: 09/26/2018 CLINICAL DATA:  Left shoulder pain and limited range of motion secondary to a fall today. EXAM: LEFT SHOULDER - 2+ VIEW COMPARISON:  Chest x-rays dated 08/15/2017 and 06/24/2017. FINDINGS: There appears to be a slightly impacted fracture of the distal left clavicle. Glenohumeral joint appears normal. No dislocation. IMPRESSION: Fracture of the distal left clavicle. Electronically Signed   By: Lorriane Shire M.D.   On: 09/26/2018 12:37  LOS: 1 day   Time spent: More than 50% of that time was spent in counseling and/or coordination of care.  Antonieta Pert, MD [facility]  09/28/2018, 10:02 AM

## 2018-09-28 NOTE — Progress Notes (Signed)
CMT alerted pt's HR sustaining in lower 40s, dipped as low as 39. Pt asymptomatic, VSS. Will continue to monitor.

## 2018-09-28 NOTE — TOC Initial Note (Signed)
Transition of Care [facility]) - Initial/Assessment Note    Patient Details  Name: Sabrina [name] MRN: [id number] Date of Birth: 17-Jun-1926  Transition of Care [facility]) CM/SW Contact:    Dessa Phi, RN Phone Number: 09/28/2018, 12:42 PM  Clinical Narrative: Patient recc SNF. Patient/sister Garald Balding on speaker phone per patient permission-they both decline SNF. Patient is her spouse's primary caregiver-spouse has dementia. They have 24hr/7days private caregivers, has rw,can,shower seat. May need PTAR non emergency @ d/c.[facility] accepted for HHPT/OT-rep Santiago Glad following.                  Expected Discharge Plan: [place] Barriers to Discharge: Continued Medical Work up   Patient Goals and CMS Choice Patient states their goals for this hospitalization and ongoing recovery are:: go home CMS Medicare.gov Compare Post Acute Care list provided to:: Patient Choice offered to / list presented to : Patient  Expected Discharge Plan and Services Expected Discharge Plan: [place]   Discharge Planning Services: CM Consult                               HH Arranged: PT, OT [facility] Agency: [name] [facility])   Time HH Agency Contacted: [id number] Representative spoke with at [place]: Santiago Glad  Prior Living Arrangements/Services   Lives with:: Spouse(Patient is spouse's caregiver-spouse w/dementia) Patient language and need for interpreter reviewed:: Yes Do you feel safe going back to the place where you live?: Yes      Need for Family Participation in Patient Care: No (Comment) Care giver support system in place?: Yes (comment) Current home services: DME, Other (comment)(rw,cane,built in shower seat;private 24/7 caregivers) Criminal Activity/Legal Involvement Pertinent to Current Situation/Hospitalization: No - Comment as needed  Activities of Daily Living Home Assistive Devices/Equipment: Walker (specify type)(2 wheel walker) ADL Screening  (condition at time of admission) Patient's cognitive ability adequate to safely complete daily activities?: Yes Is the patient deaf or have difficulty hearing?: Yes Does the patient have difficulty seeing, even when wearing glasses/contacts?: Yes Does the patient have difficulty concentrating, remembering, or making decisions?: No Patient able to express need for assistance with ADLs?: Yes Does the patient have difficulty dressing or bathing?: Yes Independently performs ADLs?: Yes (appropriate for developmental age) Does the patient have difficulty walking or climbing stairs?: Yes Weakness of Legs: Both Weakness of Arms/Hands: Left  Permission Sought/Granted Permission sought to share information with : Case Manager Permission granted to share information with : Yes, Verbal Permission Granted  Share Information with NAME: Garald Balding  Permission granted to share info w AGENCY: [facility]  Permission granted to share info w Relationship: sister  Permission granted to share info w Contact Information: [phone]/c#[phone]  Emotional Assessment Appearance:: Appears stated age Attitude/Demeanor/Rapport: Gracious Affect (typically observed): Accepting Orientation: : Oriented to Self, Oriented to Place, Oriented to  Time, Oriented to Situation Alcohol / Substance Use: Never Used Psych Involvement: No (comment)  Admission diagnosis:  Hypercalcemia [E83.52] Dehydration [E86.0] AKI (acute kidney injury) ([place]) [N17.9] Nausea and vomiting in adult [R11.2] Fall on same level from slipping, tripping or stumbling, initial encounter [W01.0XXA] Contusion of left temporofrontal scalp, initial encounter [S00.03XA] Closed nondisplaced fracture of left clavicle, unspecified part of clavicle, initial encounter [S42.002A] Patient Active Problem List   Diagnosis Date Noted  . Clavicle fracture 09/26/2018  . Blurry vision, bilateral   . Acute blood loss anemia   .  History of breast cancer 05/30/2017   . Weakness 05/30/2017  . Occipital infarction ([place]) 05/30/2017  . Pressure injury of skin 05/28/2017  . Fall at home 05/27/2017  . CKD (chronic kidney disease), stage IV ([place]) 05/27/2017  . High cholesterol 05/27/2017  . DM (diabetes mellitus) ([place]) 05/27/2017  . Hypertension 05/27/2017  . Gout 05/27/2017  . Elevated troponin 05/27/2017  . Lipoma of lower extremity 09/12/2014  . Abnormal x-ray 03/15/2014  . Chronic diastolic congestive heart failure ([place]) 02/23/2014  . Osteopenia 11/30/2013  . Hypoglycemia 09/13/2013  . Acute respiratory failure with hypoxia ([place]) 06/14/2013  . History of pulmonary embolism 06/10/2013  . Nausea and vomiting 06/10/2013  . Accelerated hypertension 06/10/2013  . Aortic stenosis 04/22/2013  . Edema leg 04/22/2013  . Breast cancer of upper-outer quadrant of right female breast ([place]) 03/19/2013   PCP:  [name], MD Pharmacy:   [place], [place] [address] Phone: [phone] Fax: [phone]     Social Determinants of Health (SDOH) Interventions    Readmission Risk Interventions No flowsheet data found.

## 2018-09-29 DIAGNOSIS — S42002D Fracture of unspecified part of left clavicle, subsequent encounter for fracture with routine healing: Secondary | ICD-10-CM

## 2018-09-29 DIAGNOSIS — N179 Acute kidney failure, unspecified: Secondary | ICD-10-CM

## 2018-09-29 LAB — GLUCOSE, CAPILLARY
Glucose-Capillary: 116 mg/dL — ABNORMAL HIGH (ref 70–99)
Glucose-Capillary: 158 mg/dL — ABNORMAL HIGH (ref 70–99)
Glucose-Capillary: 141 mg/dL — ABNORMAL HIGH (ref 70–99)

## 2018-09-29 MED ORDER — HYDRALAZINE HCL 10 MG PO TABS: 10.0000 mg | ORAL_TABLET | Freq: Three times a day (TID) | ORAL | 2 refills | Status: DC

## 2018-09-29 NOTE — Discharge Summary (Signed)
Physician Discharge Summary  Wheatcroft Cutbirth DXI:[id number] DOB: 1926/06/25 DOA: 09/26/2018  PCP: [name], MD  Admit date: 09/26/2018 Discharge date: 09/29/2018  Admitted From:[facility].  Disposition:  Home.   Recommendations for Outpatient Follow-up:  1. Follow up with PCP in 1-2 weeks 2. Please obtain BMP/CBC in one week   Home Health:yes.   Discharge Condition:stable.  CODE STATUS: full code.  Diet recommendation: Heart Healthy    Brief/Interim Summary: 85 y.o.femalewith medical history significant ofhypertension, breast cancer, chemotherapy-induced DVT and PE, type 2 diabetes on oral hypoglycemics, hyperlipidemia, chronic debility, stage III chronic kidney disease who presented to the emergency room with fall and left shoulder pain in ER, Skeletal survey indicates left lateral clavicle fracture, hemoglobin WBC normal. Creatinine 2.05, baseline is about 1.79. BUN is 76. CT head is normal.Shoulder immobilizer applied to the left hand She was found to have profound debility, now unable to use left arm, also found to have dehydration with no adequate home support so was admitted.  She takes care of her elderly demented husband at home. Seen by PT OT and SNF has been recommended.  Discharge Diagnoses:  Principal Problem:   Clavicle fracture Active Problems:   Nausea and vomiting   Fall at home   CKD (chronic kidney disease), stage IV (HCC)   DM (diabetes mellitus) ([place])   Hypertension   History of breast cancer  Fall at home with clavicular fracture: Continue splint.cont pt/ot, cont pain control.  Profound deconditioning and ambulatory dysfunction:PT OT eval. She is primary care giver for her demented husband.but reports has caregiver at home and in her absence , caregiver will stay more hours.  Seen by PT OT and will need a skilled nursing facility placement.   AKI on CKD stage III/IV, baseline creat 1.7-2s.improved to 1.8 from 2.0.  Deconditioning: PT OT eval  for deconditioning  Nausea vomiting chronic, currently stable. History of cryptogenic stroke which she had recovered from, aspirin and Lipitor  Type 2 diabetes mellitus on OHA, sugar controlled continue SSI while here and resume OHA on discharge.  Hypertension : better controlled. Hold verapamil as she had intermittent bradycardia in 40's , and with recent falls. Will hold the verapamil for now.   History of cryptogenic stroke, on aspirin 325. Her Lipitor was recently discontinued back in June due to not feeling well   Discharge Instructions  Discharge Instructions    Diet - low sodium heart healthy   Complete by: As directed    Increase activity slowly   Complete by: As directed      Allergies as of 09/29/2018      Reactions   Beta Adrenergic Blockers       Medication List    STOP taking these medications   verapamil 180 MG 24 hr capsule Commonly known as: VERELAN PM   verapamil 240 MG CR tablet Commonly known as: CALAN-SR     TAKE these medications   allopurinol 100 MG tablet Commonly known as: ZYLOPRIM Take 100 mg by mouth daily.   aspirin 325 MG EC tablet Take 1 tablet (325 mg total) by mouth daily.   cholecalciferol 1000 units tablet Commonly known as: VITAMIN D Take 1,000 Units by mouth daily.   cloNIDine 0.2 MG tablet Commonly known as: CATAPRES Take 0.2 mg by mouth 2 (two) times daily.   denosumab 60 MG/ML Sosy injection Commonly known as: PROLIA Inject 60 mg into the skin every 6 (six) months.   furosemide 40 MG tablet Commonly known as: LASIX TAKE  40 mg  TABLET EVERY OTHER DAY What changed:   how much to take  how to take this  when to take this  additional instructions   gabapentin 100 MG capsule Commonly known as: NEURONTIN Take 200-300 mg by mouth See admin instructions. Take 200 mg by mouth twice a day and take 300 mg by mouth at bedtime   glipiZIDE 5 MG tablet Commonly known as: GLUCOTROL Take 5 mg by mouth 2 (two) times  daily before a meal.   hydrALAZINE 10 MG tablet Commonly known as: APRESOLINE Take 1 tablet (10 mg total) by mouth 3 (three) times daily.   hydrocortisone cream 1 % Apply 1 application topically daily as needed for itching.   polyethylene glycol 17 g packet Commonly known as: MIRALAX / GLYCOLAX Take 17 g by mouth daily.   traMADol 50 MG tablet Commonly known as: ULTRAM Take 50 mg by mouth 2 (two) times daily as needed for moderate pain.      Follow-up Information    Stoneking, Hal, MD Follow up in 1 week(s).   Specialty: Internal Medicine Contact information: [address] [place] [address] [phone]        [facility] Follow up.   Specialty: [place] Why: [place] physical therapy/occupational therapy       Mcarthur Rossetti, MD Follow up in 1 week(s).   Specialty: Orthopedic Surgery Why: for clavicular fracture  Contact information: [address] [place]  [address] [phone]          Allergies  Allergen Reactions  . Beta Adrenergic Blockers     Consultations:  None.    Procedures/Studies: Ct Head Wo Contrast  Result Date: 09/26/2018 CLINICAL DATA:  Head trauma secondary to a fall at home. EXAM: CT HEAD WITHOUT CONTRAST TECHNIQUE: Contiguous axial images were obtained from the base of the skull through the vertex without intravenous contrast. COMPARISON:  CT scan dated 09/22/2017 FINDINGS: Brain: There is no acute intracranial hemorrhage or acute infarction. There are old bilateral occipital lobe infarcts with secondary encephalomalacia. Minimal diffuse atrophy with secondary ventricular dilatation, unchanged. Vascular: No hyperdense vessel or unexpected calcification. Skull: Normal. Negative for fracture or focal lesion. Sinuses/Orbits: Normal. Other: None IMPRESSION: No acute abnormalities. Old bilateral occipital lobe infarcts. Electronically Signed   By: Lorriane Shire M.D.   On:  09/26/2018 12:41   Dg Shoulder Left  Result Date: 09/26/2018 CLINICAL DATA:  Left shoulder pain and limited range of motion secondary to a fall today. EXAM: LEFT SHOULDER - 2+ VIEW COMPARISON:  Chest x-rays dated 08/15/2017 and 06/24/2017. FINDINGS: There appears to be a slightly impacted fracture of the distal left clavicle. Glenohumeral joint appears normal. No dislocation. IMPRESSION: Fracture of the distal left clavicle. Electronically Signed   By: Lorriane Shire M.D.   On: 09/26/2018 12:37     Subjective: Pain controlled.   Discharge Exam: Vitals:   09/29/18 0927 09/29/18 1342  BP: (!) 235/83 (!) 141/61  Pulse: 77 (!) 57  Resp: 20 20  Temp: 97.6 F (36.4 C) 98.8 F (37.1 C)  SpO2: 96% 97%   Vitals:   09/28/18 2047 09/29/18 0535 09/29/18 0927 09/29/18 1342  BP: (!) 167/70 (!) 172/61 (!) 235/83 (!) 141/61  Pulse: 78 69 77 (!) 57  Resp: 17 (!) 21 20 20   Temp: 98 F (36.7 C) 98.4 F (36.9 C) 97.6 F (36.4 C) 98.8 F (37.1 C)  TempSrc: Oral Oral Oral Oral  SpO2: 95% 94%  96% 97%  Weight:      Height:        General: Pt is alert, awake, not in acute distress Cardiovascular: RRR, S1/S2 +, no rubs, no gallops Respiratory: CTA bilaterally, no wheezing, no rhonchi Abdominal: Soft, NT, ND, bowel sounds + Extremities: no edema, no cyanosis    The results of significant diagnostics from this hospitalization (including imaging, microbiology, ancillary and laboratory) are listed below for reference.     Microbiology: Recent Results (from the past 240 hour(s))  SARS Coronavirus 2 (CEPHEID - Performed in [place] hospital lab), Hosp Order     Status: None   Collection Time: 09/26/18  3:32 PM   Specimen: Nasopharyngeal Swab  Result Value Ref Range Status   SARS Coronavirus 2 NEGATIVE NEGATIVE Final    Comment: (NOTE) If result is NEGATIVE SARS-CoV-2 target nucleic acids are NOT DETECTED. The SARS-CoV-2 RNA is generally detectable in upper and lower  respiratory  specimens during the acute phase of infection. The lowest  concentration of SARS-CoV-2 viral copies this assay can detect is 250  copies / mL. A negative result does not preclude SARS-CoV-2 infection  and should not be used as the sole basis for treatment or other  patient management decisions.  A negative result may occur with  improper specimen collection / handling, submission of specimen other  than nasopharyngeal swab, presence of viral mutation(s) within the  areas targeted by this assay, and inadequate number of viral copies  (<250 copies / mL). A negative result must be combined with clinical  observations, patient history, and epidemiological information. If result is POSITIVE SARS-CoV-2 target nucleic acids are DETECTED. The SARS-CoV-2 RNA is generally detectable in upper and lower  respiratory specimens dur ing the acute phase of infection.  Positive  results are indicative of active infection with SARS-CoV-2.  Clinical  correlation with patient history and other diagnostic information is  necessary to determine patient infection status.  Positive results do  not rule out bacterial infection or co-infection with other viruses. If result is PRESUMPTIVE POSTIVE SARS-CoV-2 nucleic acids MAY BE PRESENT.   A presumptive positive result was obtained on the submitted specimen  and confirmed on repeat testing.  While 2019 novel coronavirus  (SARS-CoV-2) nucleic acids may be present in the submitted sample  additional confirmatory testing may be necessary for epidemiological  and / or clinical management purposes  to differentiate between  SARS-CoV-2 and other Sarbecovirus currently known to infect humans.  If clinically indicated additional testing with an alternate test  methodology [phone]) is advised. The SARS-CoV-2 RNA is generally  detectable in upper and lower respiratory sp ecimens during the acute  phase of infection. The expected result is Negative. Fact Sheet for  Patients:  StrictlyIdeas.no Fact Sheet for Healthcare Providers: BankingDealers.co.za This test is not yet approved or cleared by the [place] FDA and has been authorized for detection and/or diagnosis of SARS-CoV-2 by FDA under an Emergency Use Authorization (EUA).  This EUA will remain in effect (meaning this test can be used) for the duration of the COVID-19 declaration under Section 564(b)(1) of the Act, 21 U.S.C. section 360bbb-3(b)(1), unless the authorization is terminated or revoked sooner. Performed at [facility], [place] [address], [place], [place] [address]      Labs: BNP (last 3 results) No results for input(s): BNP in the last 8760 hours. Basic Metabolic Panel: Recent Labs  Lab 09/26/18 1244 09/27/18 0533  NA 145 145  K 3.7 3.7  CL  104 107  CO2 30 30  GLUCOSE 123* 130*  BUN 76* 60*  CREATININE 2.05* 1.87*  CALCIUM 11.3* 10.6*  MG  --  2.3  PHOS  --  4.2   Liver Function Tests: Recent Labs  Lab 09/26/18 1244  AST 15  ALT 14  ALKPHOS 85  BILITOT 0.8  PROT 7.0  ALBUMIN 3.8   No results for input(s): LIPASE, AMYLASE in the last 168 hours. No results for input(s): AMMONIA in the last 168 hours. CBC: Recent Labs  Lab 09/26/18 1244  WBC 9.3  HGB 10.7*  HCT 34.6*  MCV 102.4*  PLT 184   Cardiac Enzymes: No results for input(s): CKTOTAL, CKMB, CKMBINDEX, TROPONINI in the last 168 hours. BNP: Invalid input(s): POCBNP CBG: Recent Labs  Lab 09/28/18 1154 09/28/18 1624 09/28/18 2048 09/29/18 0730 09/29/18 1106  GLUCAP 146* 106* 113* 116* 158*   D-Dimer No results for input(s): DDIMER in the last 72 hours. Hgb A1c No results for input(s): HGBA1C in the last 72 hours. Lipid Profile No results for input(s): CHOL, HDL, LDLCALC, TRIG, CHOLHDL, LDLDIRECT in the last 72 hours. Thyroid function studies No results for input(s): TSH, T4TOTAL, T3FREE, THYROIDAB in the last 72  hours.  Invalid input(s): FREET3 Anemia work up No results for input(s): VITAMINB12, FOLATE, FERRITIN, TIBC, IRON, RETICCTPCT in the last 72 hours. Urinalysis    Component Value Date/Time   COLORURINE STRAW (A) 09/26/2018 1244   APPEARANCEUR CLEAR 09/26/2018 1244   LABSPEC 1.011 09/26/2018 1244   [place] 7.0 09/26/2018 [place] 09/26/2018 [place] NEGATIVE 09/26/2018 [place] NEGATIVE 09/26/2018 1244   KETONESUR NEGATIVE 09/26/2018 1244   PROTEINUR 100 (A) 09/26/2018 1244   UROBILINOGEN 0.2 09/13/2013 0523   NITRITE NEGATIVE 09/26/2018 1244   LEUKOCYTESUR NEGATIVE 09/26/2018 1244   Sepsis Labs Invalid input(s): PROCALCITONIN,  WBC,  LACTICIDVEN Microbiology Recent Results (from the past 240 hour(s))  SARS Coronavirus 2 (CEPHEID - Performed in [place] hospital lab), Hosp Order     Status: None   Collection Time: 09/26/18  3:32 PM   Specimen: Nasopharyngeal Swab  Result Value Ref Range Status   SARS Coronavirus 2 NEGATIVE NEGATIVE Final    Comment: (NOTE) If result is NEGATIVE SARS-CoV-2 target nucleic acids are NOT DETECTED. The SARS-CoV-2 RNA is generally detectable in upper and lower  respiratory specimens during the acute phase of infection. The lowest  concentration of SARS-CoV-2 viral copies this assay can detect is 250  copies / mL. A negative result does not preclude SARS-CoV-2 infection  and should not be used as the sole basis for treatment or other  patient management decisions.  A negative result may occur with  improper specimen collection / handling, submission of specimen other  than nasopharyngeal swab, presence of viral mutation(s) within the  areas targeted by this assay, and inadequate number of viral copies  (<250 copies / mL). A negative result must be combined with clinical  observations, patient history, and epidemiological information. If result is POSITIVE SARS-CoV-2 target nucleic acids are DETECTED. The  SARS-CoV-2 RNA is generally detectable in upper and lower  respiratory specimens dur ing the acute phase of infection.  Positive  results are indicative of active infection with SARS-CoV-2.  Clinical  correlation with patient history and other diagnostic information is  necessary to determine patient infection status.  Positive results do  not rule out bacterial infection or co-infection with other viruses. If result is PRESUMPTIVE POSTIVE SARS-CoV-2 nucleic  acids MAY BE PRESENT.   A presumptive positive result was obtained on the submitted specimen  and confirmed on repeat testing.  While 2019 novel coronavirus  (SARS-CoV-2) nucleic acids may be present in the submitted sample  additional confirmatory testing may be necessary for epidemiological  and / or clinical management purposes  to differentiate between  SARS-CoV-2 and other Sarbecovirus currently known to infect humans.  If clinically indicated additional testing with an alternate test  methodology [phone]) is advised. The SARS-CoV-2 RNA is generally  detectable in upper and lower respiratory sp ecimens during the acute  phase of infection. The expected result is Negative. Fact Sheet for Patients:  StrictlyIdeas.no Fact Sheet for Healthcare Providers: BankingDealers.co.za This test is not yet approved or cleared by the [place] FDA and has been authorized for detection and/or diagnosis of SARS-CoV-2 by FDA under an Emergency Use Authorization (EUA).  This EUA will remain in effect (meaning this test can be used) for the duration of the COVID-19 declaration under Section 564(b)(1) of the Act, 21 U.S.C. section 360bbb-3(b)(1), unless the authorization is terminated or revoked sooner. Performed at [facility], [place] [address], [place], [place] [address]      Time coordinating discharge:36  minutes  SIGNED:   Hosie Poisson, MD  [facility] 09/29/2018, 3:41 PM Pager   If 7PM-7AM, please contact night-coverage [email or web address] Password [email or web address]

## 2018-09-29 NOTE — Progress Notes (Signed)
Occupational Therapy Treatment Patient Details Name: Sabrina [name] MRN: [id number] DOB: 14-Jan-1927 Today's Date: 09/29/2018    History of present illness 85 y.o. female with medical history significant of hypertension, breast cancer, chemotherapy induced DVT and PE, type 2 diabetes on oral hypoglycemics, hyperlipidemia, chronic debility, stage III chronic kidney disease who presented to the emergency room with fall and left shoulder pain in ER. Skeletal survey indicates left lateral clavicle fracture. Shoulder immobilizer applied to the LUE   OT comments  Pt will need significant A at home with ADL activity   Follow Up Recommendations  SNF;Supervision/Assistance - 24 hour;Home health OT(pt refusing SNF)    Equipment Recommendations  Other (comment);3 in 1 bedside commode(if pt denies SNF)       Precautions / Restrictions Precautions Precautions: Shoulder Shoulder Interventions: Shoulder sling/immobilizer;At all times Restrictions Weight Bearing Restrictions: Yes LUE Weight Bearing: Non weight bearing       Mobility Bed Mobility               General bed mobility comments: pt in chair  Transfers Overall transfer level: Needs assistance   Transfers: Sit to/from Stand;Stand Pivot Transfers Sit to Stand: Mod assist;+2 safety/equipment;+2 physical assistance Stand pivot transfers: Mod assist;+2 physical assistance;+2 safety/equipment            Balance Overall balance assessment: Needs assistance Sitting-balance support: Single extremity supported Sitting balance-Leahy Scale: Good     Standing balance support: Single extremity supported;During functional activity Standing balance-Leahy Scale: Poor                             ADL either performed or assessed with clinical judgement   ADL Overall ADL's : Needs assistance/impaired                 Upper Body Dressing : Maximal assistance;Adhering to UE precautions;Cueing for UE  precautions;Cueing for compensatory techniques;Sitting       Toilet Transfer: Moderate assistance;BSC;Cueing for sequencing;Cueing for safety;Stand-pivot   Toileting- Clothing Manipulation and Hygiene: Total assistance;Sit to/from stand;+2 for physical assistance;+2 for safety/equipment;Cueing for sequencing;Cueing for safety         General ADL Comments: pt will need significant A at home with ADL activity .  Pt has caregivers for her husband in which she states will be able to A her.     Vision Baseline Vision/History: No visual deficits            Cognition Arousal/Alertness: Awake/alert Behavior During Therapy: WFL for tasks assessed/performed Overall Cognitive Status: No family/caregiver present to determine baseline cognitive functioning                                 General Comments: decreased awareness to amout of A she will need ath ome                   Pertinent Vitals/ Pain       Pain Assessment: No/denies pain     Prior Functioning/Environment              Frequency  Min 2X/week        Progress Toward Goals  OT Goals(current goals can now be found in the care plan section)  Progress towards OT goals: Progressing toward goals  Acute Rehab OT Goals OT Goal Formulation: With patient  Plan Discharge plan needs to be updated  AM-PAC OT "6 Clicks" Daily Activity     Outcome Measure   Help from another person eating meals?: A Little Help from another person taking care of personal grooming?: A Little Help from another person toileting, which includes using toliet, bedpan, or urinal?: A Lot Help from another person bathing (including washing, rinsing, drying)?: A Lot Help from another person to put on and taking off regular upper body clothing?: A Lot Help from another person to put on and taking off regular lower body clothing?: A Lot 6 Click Score: 14    End of Session Equipment Utilized During Treatment: Gait  belt;Other (comment)(quad cane; LUE sling)  OT Visit Diagnosis: Muscle weakness (generalized) (M62.81);Other abnormalities of gait and mobility (R26.89);Unsteadiness on feet (R26.81);History of falling (Z91.81);Pain Pain - Right/Left: Left Pain - part of body: Arm   Activity Tolerance Patient tolerated treatment well   Patient Left with call bell/phone within reach;in chair;with chair alarm set   Nurse Communication Mobility status        Time: 1035-1101 OT Time Calculation (min): 26 min  Charges: OT General Charges $OT Visit: 1 Visit OT Treatments $Self Care/Home Management : 23-37 mins  Kari Baars, [place] [phone] Office- [address], Edwena Felty D 09/29/2018, 11:45 AM

## 2018-09-29 NOTE — TOC Progression Note (Addendum)
Transition of Care [facility]) - Progression Note    Patient Details  Name: Sabrina [name] MRN: [id number] Date of Birth: October 06, 1926  Transition of Care [facility]) CM/SW Contact  Joaquin Courts, RN Phone Number: 09/29/2018, 3:51 PM  Clinical Narrative:    [facility] services expanded to include RN, aide, and social work. [employer] to deliver 3-in-1 to bedside.  [facility] transportation arranged.   Expected Discharge Plan: [place] Barriers to Discharge: Continued Medical Work up  Expected Discharge Plan and Services Expected Discharge Plan: [place]   Discharge Planning Services: CM Consult     Expected Discharge Date: 09/29/18                         HH Arranged: PT, OT [place] Agency: [place] ([place])   Time [place]: 1241 Representative spoke with at [place]: [place] ([place]) Interventions    Readmission Risk Interventions No flowsheet data found.

## 2018-09-29 NOTE — Progress Notes (Signed)
Patients being discharged home, discharge paperwork including all medications discussed with patient. Pt has caregiver, Basilia Jumbo, at her house and this RN spoke with her and she stated she would be there or another caregiver would be there 24/7. Home health is ordered and set up by Case Manager. All personal belongings including rings, cell phone, medications, clothes, returned to patient. Bedside commode delivered to pts room and awaiting pick up from patients neighbor. PTAR called for transport. Patient has no further questions.

## 2018-09-30 ENCOUNTER — Telehealth: Payer: Self-pay

## 2018-09-30 NOTE — Progress Notes (Signed)
[facility] Summary Report / Loop Recorder 

## 2018-09-30 NOTE — Telephone Encounter (Signed)
Spoke to pt regarding LINQ alerts, pt states she fell 09/26/18 around 0800 and went to the hospital. Requested manual transmission. Receiver battery is too low to send transmission, will call back around 0900 to give receiver time to charge.

## 2018-09-30 NOTE — Telephone Encounter (Signed)
Unable to LMOVM for pt to call DC back to provide assistance w/ sending manual transmission

## 2018-10-02 NOTE — Telephone Encounter (Signed)
I spoke with the pt and she did send the transmission while I was on the phone. Transmission received. I told her if the nurse see anything she will give her a call. I told her if the nurse do not call back everything looks good. The pt verbalized understanding.

## 2018-10-02 NOTE — Telephone Encounter (Signed)
Transmission received. 477 brady episodes (<30bpm, >= 4 beats) noted since 04/24/18. Brady episodes often occur between 06:00-09:30. Pt reports that she typically wakes up between 07:00-08:30. Denies any dizziness or syncope. No brady/pause episodes to correlate with fall on 09/26/18. Most recent brady episode occurred on 09/23/18. As patient denies symptoms with brady episodes, watchful waiting recommended by Dr. Lovena Le. Pt agrees to call for presyncope/syncope.  Discussed AF episode detected on 09/26/18 at 18:37. Pt was hospitalized at the time of the episode. Per Dr. Lovena Le, should discuss [place], recommended AF Clinic OV. Pt is agreeable to this plan, scheduled for appointment with R. Fenton, [place], on 10/07/18 at 11:00. Parking code and instructions given. Pt will need her caregiver present as she cannot use her walker and must use WC due to clavicle fracture.          COVID-19 Pre-Screening Questions:  . In the past 7 to 10 days have you had a cough, shortness of breath, headache, congestion, fever (100 or greater) body aches, chills, sore throat, or sudden loss of taste or sense of smell? . Have you been around anyone with known Covid 19? [name] Have you been around anyone who is awaiting Covid 19 test results in the past 7 to 10 days? . Have you been around anyone who has been exposed to Covid 19, or has mentioned symptoms of Covid 19 within the past 7 to 10 days?  If you have any concerns/questions about symptoms patients report during screening (either on the phone or at threshold). Contact the provider seeing the patient or DOD for further guidance.  If neither are available contact a member of the leadership team.   Pt answered "no" to all screening questions. Aware to wear her own mask to OV.

## 2018-10-06 ENCOUNTER — Inpatient Hospital Stay: Payer: Medicare Other | Admitting: Physician Assistant

## 2018-10-07 ENCOUNTER — Other Ambulatory Visit: Payer: Self-pay

## 2018-10-07 ENCOUNTER — Ambulatory Visit (HOSPITAL_COMMUNITY)
Admission: RE | Admit: 2018-10-07 | Discharge: 2018-10-07 | Disposition: A | Discharge status: Home / Self Care | Payer: Medicare Other | Source: Ambulatory Visit | Attending: Physician Assistant | Admitting: Physician Assistant

## 2018-10-07 ENCOUNTER — Encounter (HOSPITAL_COMMUNITY): Payer: Self-pay | Admitting: Physician Assistant

## 2018-10-07 VITALS — BP 118/64 | HR 72 | Ht 59.0 in | Wt 165.0 lb

## 2018-10-07 DIAGNOSIS — Z85828 Personal history of other malignant neoplasm of skin: Secondary | ICD-10-CM | POA: Insufficient documentation

## 2018-10-07 DIAGNOSIS — Z8673 Personal history of transient ischemic attack (TIA), and cerebral infarction without residual deficits: Secondary | ICD-10-CM | POA: Diagnosis not present

## 2018-10-07 DIAGNOSIS — Z923 Personal history of irradiation: Secondary | ICD-10-CM | POA: Insufficient documentation

## 2018-10-07 DIAGNOSIS — E7800 Pure hypercholesterolemia, unspecified: Secondary | ICD-10-CM | POA: Insufficient documentation

## 2018-10-07 DIAGNOSIS — M1990 Unspecified osteoarthritis, unspecified site: Secondary | ICD-10-CM | POA: Insufficient documentation

## 2018-10-07 DIAGNOSIS — I4891 Unspecified atrial fibrillation: Secondary | ICD-10-CM | POA: Diagnosis present

## 2018-10-07 DIAGNOSIS — I480 Paroxysmal atrial fibrillation: Secondary | ICD-10-CM | POA: Insufficient documentation

## 2018-10-07 DIAGNOSIS — Z853 Personal history of malignant neoplasm of breast: Secondary | ICD-10-CM | POA: Insufficient documentation

## 2018-10-07 DIAGNOSIS — Z888 Allergy status to other drugs, medicaments and biological substances status: Secondary | ICD-10-CM | POA: Diagnosis not present

## 2018-10-07 DIAGNOSIS — Z8249 Family history of ischemic heart disease and other diseases of the circulatory system: Secondary | ICD-10-CM | POA: Insufficient documentation

## 2018-10-07 DIAGNOSIS — I129 Hypertensive chronic kidney disease with stage 1 through stage 4 chronic kidney disease, or unspecified chronic kidney disease: Secondary | ICD-10-CM | POA: Diagnosis not present

## 2018-10-07 DIAGNOSIS — Z7984 Long term (current) use of oral hypoglycemic drugs: Secondary | ICD-10-CM | POA: Diagnosis not present

## 2018-10-07 DIAGNOSIS — R5381 Other malaise: Secondary | ICD-10-CM | POA: Diagnosis not present

## 2018-10-07 DIAGNOSIS — Z86718 Personal history of other venous thrombosis and embolism: Secondary | ICD-10-CM | POA: Insufficient documentation

## 2018-10-07 DIAGNOSIS — N183 Chronic kidney disease, stage 3 (moderate): Secondary | ICD-10-CM | POA: Insufficient documentation

## 2018-10-07 DIAGNOSIS — Z9221 Personal history of antineoplastic chemotherapy: Secondary | ICD-10-CM | POA: Insufficient documentation

## 2018-10-07 DIAGNOSIS — Z7982 Long term (current) use of aspirin: Secondary | ICD-10-CM | POA: Insufficient documentation

## 2018-10-07 DIAGNOSIS — Z86711 Personal history of pulmonary embolism: Secondary | ICD-10-CM | POA: Diagnosis not present

## 2018-10-07 DIAGNOSIS — Z79899 Other long term (current) drug therapy: Secondary | ICD-10-CM | POA: Insufficient documentation

## 2018-10-07 DIAGNOSIS — M109 Gout, unspecified: Secondary | ICD-10-CM | POA: Insufficient documentation

## 2018-10-07 MED ORDER — APIXABAN 2.5 MG PO TABS: 2.5000 mg | ORAL_TABLET | Freq: Two times a day (BID) | ORAL | 6 refills | Status: DC

## 2018-10-07 NOTE — Patient Instructions (Signed)
Stop aspirin ? ?Start Eliquis 2.5mg twice a day ?

## 2018-10-07 NOTE — Progress Notes (Signed)
Primary Care Physician: [name], MD Primary Electrophysiologist: Dr Lovena Le Referring Physician: Dr [facility]   [facility] Sabrina [name] is a 85 y.o. female with a history of hypertension, breast cancer, chemotherapy-induced DVT and PE, type 2 diabetes on oral hypoglycemics, hyperlipidemia, chronic debility, stage III chronic kidney disease and CVA who presents for follow up in the [facility].  The patient was initially diagnosed with atrial fibrillation on her ILR on 09/26/18. She was asymptomatic during this episode. She was actually hospitalized at the time for a mechanical fall and clavicle fracture. She reports that she feels reasonably well. Her shoulder is still sore.   Today, she denies symptoms of palpitations, chest pain, shortness of breath, orthopnea, PND, dizziness, presyncope, syncope, snoring, daytime somnolence, bleeding, or neurologic sequela. The patient is tolerating medications without difficulties and is otherwise without complaint today.    Atrial Fibrillation Risk Factors:  she does not have symptoms or diagnosis of sleep apnea.  she has a BMI of Body mass index is 33.33 kg/m.[name] Filed Weights   10/07/18 1041  Weight: 74.8 kg    Family History  Problem Relation Age of Onset  . Pneumonia Mother   . Heart attack Father   . Breast cancer Other 42       niece  . Breast cancer Other 58       niece  . Ovarian cancer Other        niece     Atrial Fibrillation Management history:  Previous antiarrhythmic drugs: none Previous cardioversions: none Previous ablations: none CHADS2VASC score: 7 Anticoagulation history: none (ASA after CVA)   Past Medical History:  Diagnosis Date  . Arthritis    "mostly in my hands, lower back" (06/25/2017)  . Basal cell carcinoma (BCC) of face 1983  . Breast cancer, right breast ([place]) 03/2013  . Chronic kidney disease (CKD), stage III (moderate) (HCC)    nephrologist, Dr. [name]  . Dental crowns present   . DVT (deep venous thrombosis) ([place]) ~ 06/2013   "? side"  . Gout    "on daily RX" (06/25/2017)  . Heart murmur    no known problems; states did not know she had murmur until age 65  . High cholesterol   . Hypertension    fluctuates, especially when stressed; has been on med. > 20 yr.  . Immature cataract   . Non-insulin dependent type 2 diabetes mellitus ([place])   . Personal history of chemotherapy   . Personal history of radiation therapy   . Pulmonary embolism ([place]) ~ 06/2013  . Radiation 09/06/13-10/20/13   Right Breast Cancer  . Stroke ([place]) 05/2017   just visual problems since (06/25/2017)  . Wears partial dentures    lower   Past Surgical History:  Procedure Laterality Date  . AXILLARY LYMPH NODE DISSECTION Right 03/30/2013   Procedure: AXILLARY LYMPH NODE DISSECTION;  Surgeon: Rolm Bookbinder, MD;  Location: [place];  Service: General;  Laterality: Right;  . BASAL CELL CARCINOMA EXCISION  1983   "face"  . BREAST BIOPSY Right 03/17/2013  . BREAST CYST EXCISION Right 11/1958   benign  . BREAST LUMPECTOMY Right 03/30/2013  . BREAST LUMPECTOMY WITH NEEDLE LOCALIZATION Right 03/30/2013   Procedure: BREAST LUMPECTOMY WITH NEEDLE LOCALIZATION;  Surgeon: Rolm Bookbinder, MD;  Location: [place];  Service: General;  Laterality: Right;  . DILATION AND CURETTAGE OF UTERUS    . LOOP RECORDER INSERTION N/A 08/15/2017   Procedure: LOOP RECORDER INSERTION;  Surgeon: Evans Lance, MD;  Location: [place] [facility];  Service: Cardiovascular;  Laterality: N/A;  . PORT-A-CATH REMOVAL  2016  . PORTACATH PLACEMENT N/A 04/15/2013   Procedure: INSERTION PORT-A-CATH;  Surgeon: Rolm Bookbinder, MD;  Location: [place];  Service: General;  Laterality: N/A;  . RE-EXCISION OF BREAST CANCER,SUPERIOR MARGINS Right 04/15/2013   Procedure: RE-EXCISION OF RIGHT BREAST  MARGINS;  Surgeon: Rolm Bookbinder, MD;  Location: [place];   Service: General;  Laterality: Right;  . TONSILLECTOMY  ~ 1935/1936    Current Outpatient Medications  Medication Sig Dispense Refill  . allopurinol (ZYLOPRIM) 100 MG tablet Take 100 mg by mouth daily.     . cholecalciferol (VITAMIN D) 1000 UNITS tablet Take 1,000 Units by mouth daily.    . cloNIDine (CATAPRES) 0.2 MG tablet Take 0.2 mg by mouth 2 (two) times daily.    [name] denosumab (PROLIA) 60 MG/ML SOSY injection Inject 60 mg into the skin every 6 (six) months.    . furosemide (LASIX) 40 MG tablet Take 40 mg by mouth daily.    [name] gabapentin (NEURONTIN) 100 MG capsule Take 200-300 mg by mouth See admin instructions. Take 200 mg by mouth twice a day and take 300 mg by mouth at bedtime    . glipiZIDE (GLUCOTROL) 5 MG tablet Take 5 mg by mouth 2 (two) times daily before a meal.     . hydrALAZINE (APRESOLINE) 10 MG tablet Take 1 tablet (10 mg total) by mouth 3 (three) times daily. 90 tablet 2  . hydrocortisone cream 1 % Apply 1 application topically daily as needed for itching.    . polyethylene glycol (MIRALAX / GLYCOLAX) packet Take 17 g by mouth daily.     . traMADol (ULTRAM) 50 MG tablet Take 50 mg by mouth 2 (two) times daily as needed for moderate pain.     [name] apixaban (ELIQUIS) 2.5 MG TABS tablet Take 1 tablet (2.5 mg total) by mouth 2 (two) times daily. 60 tablet 6   No current facility-administered medications for this encounter.     Allergies  Allergen Reactions  . Beta Adrenergic Blockers     Social History   Socioeconomic History  . Marital status: Married    Spouse name: Not on file  . Number of children: 0  . Years of education: Not on file  . Highest education level: Bachelor's degree (e.g., BA, AB, BS)  Occupational History  . Not on file  Social Needs  . Financial resource strain: Not on file  . Food insecurity    Worry: Not on file    Inability: Not on file  . Transportation needs    Medical: Not on file    Non-medical: Not on file  Tobacco Use  . Smoking  status: Never Smoker  . Smokeless tobacco: Never Used  . Tobacco comment: only smoked 2 packs cigarettes total; husband quit in 1971  Substance and Sexual Activity  . Alcohol use: Yes    Alcohol/week: 3.0 standard drinks    Types: 3 Glasses of wine per week  . Drug use: No  . Sexual activity: Not Currently  Lifestyle  . Physical activity    Days per week: Not on file    Minutes per session: Not on file  . Stress: Not on file  Relationships  . Social Herbalist on phone: Not on file    Gets together: Not on file    Attends religious service: Not  on file    Active member of club or organization: Not on file    Attends meetings of clubs or organizations: Not on file    Relationship status: Not on file  . Intimate partner violence    Fear of current or ex partner: Not on file    Emotionally abused: Not on file    Physically abused: Not on file    Forced sexual activity: Not on file  Other Topics Concern  . Not on file  Social History Narrative   Lives at home with her husband   Right handed   Caffeine: 1 coffee daily at most      ROS- All systems are reviewed and negative except as per the HPI above.  Physical Exam: Vitals:   10/07/18 1041  BP: 118/64  Pulse: 72  Weight: 74.8 kg  Height: 4\' 11"  (1.499 m)    GEN- The patient is well appearing obese, elderly female, alert and oriented x 3 today.   Head- normocephalic, atraumatic Eyes-  Sclera clear, conjunctiva pink Ears- hearing intact Oropharynx- clear Neck- supple  Lungs- Clear to ausculation bilaterally, normal work of breathing Heart- Regular rate and rhythm, no murmurs, rubs or gallops  GI- soft, NT, ND, + BS Extremities- no clubbing, cyanosis. Trace edema MS- no significant deformity or atrophy Skin- no rash or lesion Psych- euthymic mood, full affect Neuro- strength and sensation are intact  Wt Readings from Last 3 Encounters:  10/07/18 74.8 kg  09/26/18 74.8 kg  01/21/18 74.8 kg    EKG  today demonstrates SR HR 72, PR 176, QTc 442  Echo 05/27/17 demonstrated  - Procedure narrative: Transthoracic echocardiography. Image   quality was poor. The study was technically difficult, as a   result of poor sound wave transmission. - Left ventricle: The cavity size was normal. Wall thickness was   increased in a pattern of mild LVH. There was mild concentric   hypertrophy. Systolic function was normal. The estimated ejection   fraction was in the range of 55% to 60%. Wall motion was normal;   there were no regional wall motion abnormalities. Doppler   parameters are consistent with abnormal left ventricular   relaxation (grade 1 diastolic dysfunction). - Aortic valve: There was mild stenosis. Valve area (VTI): 1.75   cm^2. Valve area (Vmax): 1.8 cm^2. Valve area (Vmean): 1.7 cm^2. - Mitral valve: Severely calcified annulus. Valve area by   continuity equation (using LVOT flow): 1.95 cm^2. - Atrial septum: No defect or patent foramen ovale was identified.  Epic records are reviewed at length today  Assessment and Plan:  1. Paroxysmal atrial fibrillation Episode of afib noted on ILR. General education about atrial fibrillation discussed and questions answered.  We also discussed her stroke risk and the risks and benefits of anticoagulation. Patient agreeable to starting anticoagulation. Precautions given if she should have a fall or bleeding issues. Will plan to stop ASA and start Eliquis 2.5 mg BID (age, Cr 1.87) Will recheck Bmet and CBC on follow up.  This patients CHA2DS2-VASc Score and unadjusted Ischemic Stroke Rate (% per year) is equal to 11.2 % stroke rate/year from a score of 7  Above score calculated as 1 point each if present [CHF, HTN, DM, Vascular=MI/PAD/Aortic Plaque, Age if 65-74, or Female] Above score calculated as 2 points each if present [Age > 75, or Stroke/TIA/TE]   2. HTN Stable, no changes today.   Follow up in the AF clinic in one month.   Bear Stearns  [facility] PA-C [facility] [address] [place], [place] [address] [phone] 10/07/2018 11:26 AM

## 2018-10-12 ENCOUNTER — Encounter: Payer: Self-pay | Admitting: Physician Assistant

## 2018-10-12 ENCOUNTER — Ambulatory Visit: Payer: Self-pay

## 2018-10-12 ENCOUNTER — Ambulatory Visit (INDEPENDENT_AMBULATORY_CARE_PROVIDER_SITE_OTHER): Payer: Medicare Other | Admitting: Physician Assistant

## 2018-10-12 DIAGNOSIS — M898X1 Other specified disorders of bone, shoulder: Secondary | ICD-10-CM | POA: Diagnosis not present

## 2018-10-12 NOTE — Progress Notes (Signed)
Office Visit Note   Patient: Sabrina [name]           Date of Birth: [birth year]           MRN: [id number] Visit Date: 10/12/2018              Requested by: [name], MD [address] [place],  [place] [address] PCP: [name], MD   Assessment & Plan: Visit Diagnoses:  1. Pain of left clavicle     Plan: Have recommended a sling whenever she is just sitting.  She wisely has never sleeping.  She is to come out of the sling and do gentle range of motion exercises that she was shown today.  This includes range of motion the elbow wrist hand forearm.  Also gentle internal and external rotation but to avoid extremes of internal or external rotation of the shoulder.  No overhead activity.  We will see her back in 4 weeks for reexamination.  No x-rays at that time unless clinically indicated.  Follow-Up Instructions: Return in about 4 weeks (around 11/09/2018).   Orders:  Orders Placed This Encounter  Procedures  . XR Clavicle Left   No orders of the defined types were placed in this encounter.     Procedures: No procedures performed   Clinical Data: No additional findings.   Subjective: Chief Complaint  Patient presents with  . Left Shoulder - Pain    HPI Sabrina [name] comes in today due to left clavicle pain.  She apparently had a mechanical fall on 09/03/2018.  She was taken to the ER by EMS and found to have a lateral clavicle fracture.  She is placed in a sling and swath told to follow-up here.  Patient is on Eliquis due to history of stroke and she is diabetic her caregiver who accompanies her today states that she rarely is above 160 glucose levels.  Review of Systems See HPI.  Objective: Vital Signs: There were no vitals taken for this visit.  Physical Exam Constitutional:      Appearance: She is not ill-appearing or diaphoretic.  Pulmonary:     Effort: Pulmonary effort is normal.  Neurological:     Mental Status: She is alert and  oriented to person, place, and time.     Ortho Exam No tenting of the clavicle.  Gentle internal and external rotation shoulder reveals fluid motion.  She has good range of motion of the elbow and full supination pronation of the forearm.  Full sensation throughout hand and full motor left hand.  Specialty Comments:  No specialty comments available.  Imaging: Xr Clavicle Left  Result Date: 10/12/2018 2 views left shoulder shows mildly displaced lateral clavicle fracture which is nonarticular.  No other fractures identified.  The humeral head is well located.    PMFS History: Patient Active Problem List   Diagnosis Date Noted  . Clavicle fracture 09/26/2018  . Blurry vision, bilateral   . Acute blood loss anemia   . History of breast cancer 05/30/2017  . Weakness 05/30/2017  . Occipital infarction ([place]) 05/30/2017  . Pressure injury of skin 05/28/2017  . Fall at home 05/27/2017  . CKD (chronic kidney disease), stage IV ([place]) 05/27/2017  . High cholesterol 05/27/2017  . DM (diabetes mellitus) ([place]) 05/27/2017  . Hypertension 05/27/2017  . Gout 05/27/2017  . Elevated troponin 05/27/2017  . Lipoma of lower extremity 09/12/2014  . Abnormal x-ray 03/15/2014  . Chronic diastolic congestive heart failure ([place])  02/23/2014  . Osteopenia 11/30/2013  . Hypoglycemia 09/13/2013  . Acute respiratory failure with hypoxia ([place]) 06/14/2013  . History of pulmonary embolism 06/10/2013  . Nausea and vomiting 06/10/2013  . Accelerated hypertension 06/10/2013  . Aortic stenosis 04/22/2013  . Edema leg 04/22/2013  . Breast cancer of upper-outer quadrant of right female breast ([place]) 03/19/2013   Past Medical History:  Diagnosis Date  . Arthritis    "mostly in my hands, lower back" (06/25/2017)  . Basal cell carcinoma (BCC) of face 1983  . Breast cancer, right breast ([place]) 03/2013  . Chronic kidney disease (CKD), stage III (moderate) (HCC)    nephrologist, Dr. [name]  . Dental  crowns present   . DVT (deep venous thrombosis) ([place]) ~ 06/2013   "? side"  . Gout    "on daily RX" (06/25/2017)  . Heart murmur    no known problems; states did not know she had murmur until age 58  . High cholesterol   . Hypertension    fluctuates, especially when stressed; has been on med. > 20 yr.  . Immature cataract   . Non-insulin dependent type 2 diabetes mellitus ([place])   . Personal history of chemotherapy   . Personal history of radiation therapy   . Pulmonary embolism ([place]) ~ 06/2013  . Radiation 09/06/13-10/20/13   Right Breast Cancer  . Stroke ([place]) 05/2017   just visual problems since (06/25/2017)  . Wears partial dentures    lower    Family History  Problem Relation Age of Onset  . Pneumonia Mother   . Heart attack Father   . Breast cancer Other 94       niece  . Breast cancer Other 24       niece  . Ovarian cancer Other        niece    Past Surgical History:  Procedure Laterality Date  . AXILLARY LYMPH NODE DISSECTION Right 03/30/2013   Procedure: AXILLARY LYMPH NODE DISSECTION;  Surgeon: Rolm Bookbinder, MD;  Location: [place];  Service: General;  Laterality: Right;  . BASAL CELL CARCINOMA EXCISION  1983   "face"  . BREAST BIOPSY Right 03/17/2013  . BREAST CYST EXCISION Right 11/1958   benign  . BREAST LUMPECTOMY Right 03/30/2013  . BREAST LUMPECTOMY WITH NEEDLE LOCALIZATION Right 03/30/2013   Procedure: BREAST LUMPECTOMY WITH NEEDLE LOCALIZATION;  Surgeon: Rolm Bookbinder, MD;  Location: [place];  Service: General;  Laterality: Right;  . DILATION AND CURETTAGE OF UTERUS    . LOOP RECORDER INSERTION N/A 08/15/2017   Procedure: LOOP RECORDER INSERTION;  Surgeon: Evans Lance, MD;  Location: [place] [facility];  Service: Cardiovascular;  Laterality: N/A;  . PORT-A-CATH REMOVAL  2016  . PORTACATH PLACEMENT N/A 04/15/2013   Procedure: INSERTION PORT-A-CATH;  Surgeon: Rolm Bookbinder, MD;  Location: [place];  Service: General;  Laterality: N/A;   . RE-EXCISION OF BREAST CANCER,SUPERIOR MARGINS Right 04/15/2013   Procedure: RE-EXCISION OF RIGHT BREAST  MARGINS;  Surgeon: Rolm Bookbinder, MD;  Location: [place];  Service: General;  Laterality: Right;  . TONSILLECTOMY  ~ 1935/1936   Social History   Occupational History  . Not on file  Tobacco Use  . Smoking status: Never Smoker  . Smokeless tobacco: Never Used  . Tobacco comment: only smoked 2 packs cigarettes total; husband quit in 1971  Substance and Sexual Activity  . Alcohol use: Yes    Alcohol/week: 3.0 standard drinks    Types: 3 Glasses  of wine per week  . Drug use: No  . Sexual activity: Not Currently

## 2018-10-13 ENCOUNTER — Telehealth: Payer: Self-pay | Admitting: Orthopaedic Surgery

## 2018-10-13 DIAGNOSIS — N184 Chronic kidney disease, stage 4 (severe): Secondary | ICD-10-CM | POA: Diagnosis not present

## 2018-10-13 DIAGNOSIS — E1121 Type 2 diabetes mellitus with diabetic nephropathy: Secondary | ICD-10-CM | POA: Diagnosis not present

## 2018-10-13 DIAGNOSIS — I129 Hypertensive chronic kidney disease with stage 1 through stage 4 chronic kidney disease, or unspecified chronic kidney disease: Secondary | ICD-10-CM | POA: Diagnosis not present

## 2018-10-13 DIAGNOSIS — S42022A Displaced fracture of shaft of left clavicle, initial encounter for closed fracture: Secondary | ICD-10-CM | POA: Diagnosis not present

## 2018-10-13 NOTE — Telephone Encounter (Signed)
Rosa (caregiver) called asked if patient need to sleep in the sling on her left shoulder? The number to contact [facility] is [phone]

## 2018-10-14 NOTE — Telephone Encounter (Signed)
Please advise 

## 2018-10-14 NOTE — Telephone Encounter (Signed)
She can come in and out of the sling as comfort allows.  If it is more comfortable sleeping, then that will be fine.  It is okay for her to remove it to do if that is more comfortable for her.

## 2018-10-14 NOTE — Telephone Encounter (Signed)
Left the below message for patient on her VM

## 2018-10-15 DIAGNOSIS — R269 Unspecified abnormalities of gait and mobility: Secondary | ICD-10-CM | POA: Diagnosis not present

## 2018-10-19 ENCOUNTER — Ambulatory Visit (INDEPENDENT_AMBULATORY_CARE_PROVIDER_SITE_OTHER): Payer: Medicare Other | Admitting: *Deleted

## 2018-10-19 DIAGNOSIS — I639 Cerebral infarction, unspecified: Secondary | ICD-10-CM

## 2018-10-19 LAB — CUP PACEART REMOTE DEVICE CHECK
Date Time Interrogation Session: 20200816204037
Implantable Pulse Generator Implant Date: 20190614

## 2018-10-24 ENCOUNTER — Inpatient Hospital Stay (HOSPITAL_COMMUNITY)
Admission: EM | Admit: 2018-10-24 | Discharge: 2018-10-28 | DRG: 689 | Disposition: A | Discharge status: Home / Self Care | Payer: Medicare Other | Attending: Internal Medicine | Admitting: Internal Medicine

## 2018-10-24 ENCOUNTER — Other Ambulatory Visit: Payer: Self-pay

## 2018-10-24 ENCOUNTER — Emergency Department (HOSPITAL_COMMUNITY): Payer: Medicare Other

## 2018-10-24 ENCOUNTER — Inpatient Hospital Stay (HOSPITAL_COMMUNITY): Payer: Medicare Other

## 2018-10-24 ENCOUNTER — Encounter (HOSPITAL_COMMUNITY): Payer: Self-pay | Admitting: Emergency Medicine

## 2018-10-24 DIAGNOSIS — Z7984 Long term (current) use of oral hypoglycemic drugs: Secondary | ICD-10-CM

## 2018-10-24 DIAGNOSIS — Z79891 Long term (current) use of opiate analgesic: Secondary | ICD-10-CM

## 2018-10-24 DIAGNOSIS — Z9181 History of falling: Secondary | ICD-10-CM | POA: Diagnosis not present

## 2018-10-24 DIAGNOSIS — N184 Chronic kidney disease, stage 4 (severe): Secondary | ICD-10-CM | POA: Diagnosis not present

## 2018-10-24 DIAGNOSIS — Z86718 Personal history of other venous thrombosis and embolism: Secondary | ICD-10-CM | POA: Diagnosis not present

## 2018-10-24 DIAGNOSIS — M8580 Other specified disorders of bone density and structure, unspecified site: Secondary | ICD-10-CM | POA: Diagnosis not present

## 2018-10-24 DIAGNOSIS — E1142 Type 2 diabetes mellitus with diabetic polyneuropathy: Secondary | ICD-10-CM | POA: Diagnosis not present

## 2018-10-24 DIAGNOSIS — G9341 Metabolic encephalopathy: Secondary | ICD-10-CM | POA: Diagnosis present

## 2018-10-24 DIAGNOSIS — G92 Toxic encephalopathy: Secondary | ICD-10-CM | POA: Diagnosis not present

## 2018-10-24 DIAGNOSIS — Z20828 Contact with and (suspected) exposure to other viral communicable diseases: Secondary | ICD-10-CM | POA: Diagnosis present

## 2018-10-24 DIAGNOSIS — Z85828 Personal history of other malignant neoplasm of skin: Secondary | ICD-10-CM | POA: Diagnosis not present

## 2018-10-24 DIAGNOSIS — N390 Urinary tract infection, site not specified: Secondary | ICD-10-CM | POA: Diagnosis not present

## 2018-10-24 DIAGNOSIS — R5381 Other malaise: Secondary | ICD-10-CM | POA: Diagnosis present

## 2018-10-24 DIAGNOSIS — Z8249 Family history of ischemic heart disease and other diseases of the circulatory system: Secondary | ICD-10-CM

## 2018-10-24 DIAGNOSIS — K429 Umbilical hernia without obstruction or gangrene: Secondary | ICD-10-CM | POA: Diagnosis present

## 2018-10-24 DIAGNOSIS — I10 Essential (primary) hypertension: Secondary | ICD-10-CM | POA: Diagnosis not present

## 2018-10-24 DIAGNOSIS — Z923 Personal history of irradiation: Secondary | ICD-10-CM | POA: Diagnosis not present

## 2018-10-24 DIAGNOSIS — B952 Enterococcus as the cause of diseases classified elsewhere: Secondary | ICD-10-CM | POA: Diagnosis present

## 2018-10-24 DIAGNOSIS — Z8673 Personal history of transient ischemic attack (TIA), and cerebral infarction without residual deficits: Secondary | ICD-10-CM

## 2018-10-24 DIAGNOSIS — M109 Gout, unspecified: Secondary | ICD-10-CM | POA: Diagnosis not present

## 2018-10-24 DIAGNOSIS — N3000 Acute cystitis without hematuria: Secondary | ICD-10-CM

## 2018-10-24 DIAGNOSIS — E785 Hyperlipidemia, unspecified: Secondary | ICD-10-CM | POA: Diagnosis present

## 2018-10-24 DIAGNOSIS — Z743 Need for continuous supervision: Secondary | ICD-10-CM | POA: Diagnosis not present

## 2018-10-24 DIAGNOSIS — R0602 Shortness of breath: Secondary | ICD-10-CM | POA: Diagnosis not present

## 2018-10-24 DIAGNOSIS — J189 Pneumonia, unspecified organism: Secondary | ICD-10-CM | POA: Diagnosis not present

## 2018-10-24 DIAGNOSIS — E1165 Type 2 diabetes mellitus with hyperglycemia: Secondary | ICD-10-CM | POA: Diagnosis not present

## 2018-10-24 DIAGNOSIS — I161 Hypertensive emergency: Secondary | ICD-10-CM | POA: Diagnosis present

## 2018-10-24 DIAGNOSIS — E119 Type 2 diabetes mellitus without complications: Secondary | ICD-10-CM

## 2018-10-24 DIAGNOSIS — K449 Diaphragmatic hernia without obstruction or gangrene: Secondary | ICD-10-CM | POA: Diagnosis not present

## 2018-10-24 DIAGNOSIS — E1122 Type 2 diabetes mellitus with diabetic chronic kidney disease: Secondary | ICD-10-CM

## 2018-10-24 DIAGNOSIS — N3001 Acute cystitis with hematuria: Secondary | ICD-10-CM | POA: Diagnosis not present

## 2018-10-24 DIAGNOSIS — I350 Nonrheumatic aortic (valve) stenosis: Secondary | ICD-10-CM | POA: Diagnosis present

## 2018-10-24 DIAGNOSIS — Z79899 Other long term (current) drug therapy: Secondary | ICD-10-CM

## 2018-10-24 DIAGNOSIS — J984 Other disorders of lung: Secondary | ICD-10-CM | POA: Diagnosis not present

## 2018-10-24 DIAGNOSIS — C50411 Malignant neoplasm of upper-outer quadrant of right female breast: Secondary | ICD-10-CM | POA: Diagnosis present

## 2018-10-24 DIAGNOSIS — K439 Ventral hernia without obstruction or gangrene: Secondary | ICD-10-CM | POA: Diagnosis not present

## 2018-10-24 DIAGNOSIS — Z888 Allergy status to other drugs, medicaments and biological substances status: Secondary | ICD-10-CM

## 2018-10-24 DIAGNOSIS — I5032 Chronic diastolic (congestive) heart failure: Secondary | ICD-10-CM | POA: Diagnosis present

## 2018-10-24 DIAGNOSIS — I130 Hypertensive heart and chronic kidney disease with heart failure and stage 1 through stage 4 chronic kidney disease, or unspecified chronic kidney disease: Secondary | ICD-10-CM | POA: Diagnosis present

## 2018-10-24 DIAGNOSIS — Z86711 Personal history of pulmonary embolism: Secondary | ICD-10-CM | POA: Diagnosis not present

## 2018-10-24 DIAGNOSIS — R279 Unspecified lack of coordination: Secondary | ICD-10-CM | POA: Diagnosis not present

## 2018-10-24 DIAGNOSIS — R0902 Hypoxemia: Secondary | ICD-10-CM | POA: Diagnosis not present

## 2018-10-24 DIAGNOSIS — Z9221 Personal history of antineoplastic chemotherapy: Secondary | ICD-10-CM

## 2018-10-24 DIAGNOSIS — R251 Tremor, unspecified: Secondary | ICD-10-CM | POA: Diagnosis not present

## 2018-10-24 DIAGNOSIS — Z853 Personal history of malignant neoplasm of breast: Secondary | ICD-10-CM | POA: Diagnosis not present

## 2018-10-24 DIAGNOSIS — N179 Acute kidney failure, unspecified: Secondary | ICD-10-CM | POA: Diagnosis not present

## 2018-10-24 DIAGNOSIS — C50919 Malignant neoplasm of unspecified site of unspecified female breast: Secondary | ICD-10-CM | POA: Diagnosis not present

## 2018-10-24 DIAGNOSIS — Z7901 Long term (current) use of anticoagulants: Secondary | ICD-10-CM

## 2018-10-24 DIAGNOSIS — Z170 Estrogen receptor positive status [ER+]: Secondary | ICD-10-CM

## 2018-10-24 DIAGNOSIS — R509 Fever, unspecified: Secondary | ICD-10-CM | POA: Diagnosis not present

## 2018-10-24 DIAGNOSIS — R531 Weakness: Secondary | ICD-10-CM | POA: Diagnosis not present

## 2018-10-24 LAB — CBC WITH DIFFERENTIAL/PLATELET
Abs Immature Granulocytes: 0.02 10*3/uL (ref 0.00–0.07)
Basophils Absolute: 0 10*3/uL (ref 0.0–0.1)
Basophils Relative: 0 %
Eosinophils Absolute: 0.3 10*3/uL (ref 0.0–0.5)
Eosinophils Relative: 4 %
HCT: 33.2 % — ABNORMAL LOW (ref 36.0–46.0)
Hemoglobin: 10.7 g/dL — ABNORMAL LOW (ref 12.0–15.0)
Immature Granulocytes: 0 %
Lymphocytes Relative: 34 %
Lymphs Abs: 3 10*3/uL (ref 0.7–4.0)
MCH: 32.7 pg (ref 26.0–34.0)
MCHC: 32.2 g/dL (ref 30.0–36.0)
MCV: 101.5 fL — ABNORMAL HIGH (ref 80.0–100.0)
Monocytes Absolute: 0.6 10*3/uL (ref 0.1–1.0)
Monocytes Relative: 7 %
Neutro Abs: 4.8 10*3/uL (ref 1.7–7.7)
Neutrophils Relative %: 55 %
Platelets: 189 10*3/uL (ref 150–400)
RBC: 3.27 MIL/uL — ABNORMAL LOW (ref 3.87–5.11)
RDW: 14 % (ref 11.5–15.5)
WBC: 8.8 10*3/uL (ref 4.0–10.5)
nRBC: 0 % (ref 0.0–0.2)

## 2018-10-24 LAB — URINALYSIS, ROUTINE W REFLEX MICROSCOPIC
Bilirubin Urine: NEGATIVE
Glucose, UA: NEGATIVE mg/dL
Hgb urine dipstick: NEGATIVE
Ketones, ur: NEGATIVE mg/dL
Nitrite: NEGATIVE
Protein, ur: 100 mg/dL — AB
Specific Gravity, Urine: 1.012 (ref 1.005–1.030)
WBC, UA: >50 WBC/hpf — ABNORMAL HIGH (ref 0–5)
pH: 5 (ref 5.0–8.0)

## 2018-10-24 LAB — COMPREHENSIVE METABOLIC PANEL
ALT: 13 U/L (ref 0–44)
AST: 14 U/L — ABNORMAL LOW (ref 15–41)
Albumin: 3.8 g/dL (ref 3.5–5.0)
Alkaline Phosphatase: 96 U/L (ref 38–126)
Anion gap: 12 (ref 5–15)
BUN: 97 mg/dL — ABNORMAL HIGH (ref 8–23)
CO2: 30 mmol/L (ref 22–32)
Calcium: 12.6 mg/dL — ABNORMAL HIGH (ref 8.9–10.3)
Chloride: 99 mmol/L (ref 98–111)
Creatinine, Ser: 2.66 mg/dL — ABNORMAL HIGH (ref 0.44–1.00)
GFR calc Af Amer: 18 mL/min — ABNORMAL LOW (ref >60)
GFR calc non Af Amer: 15 mL/min — ABNORMAL LOW (ref >60)
Glucose, Bld: 186 mg/dL — ABNORMAL HIGH (ref 70–99)
Potassium: 3.9 mmol/L (ref 3.5–5.1)
Sodium: 141 mmol/L (ref 135–145)
Total Bilirubin: 0.8 mg/dL (ref 0.3–1.2)
Total Protein: 7.1 g/dL (ref 6.5–8.1)

## 2018-10-24 LAB — GLUCOSE, CAPILLARY
Glucose-Capillary: 130 mg/dL — ABNORMAL HIGH (ref 70–99)
Glucose-Capillary: 180 mg/dL — ABNORMAL HIGH (ref 70–99)

## 2018-10-24 LAB — SARS CORONAVIRUS 2 BY RT PCR (HOSPITAL ORDER, PERFORMED IN ~~LOC~~ HOSPITAL LAB): SARS Coronavirus 2: NEGATIVE

## 2018-10-24 MED ORDER — ONDANSETRON HCL 4 MG/2ML IJ SOLN
4.0000 mg | Freq: Four times a day (QID) | INTRAMUSCULAR | Status: DC | PRN
  Administered 2018-10-24: 4 mg via INTRAVENOUS
  Filled 2018-10-24: qty 2

## 2018-10-24 MED ORDER — HYDRALAZINE HCL 20 MG/ML IJ SOLN
10.0000 mg | Freq: Four times a day (QID) | INTRAMUSCULAR | Status: DC | PRN
  Administered 2018-10-25 – 2018-10-28 (×3): 10 mg via INTRAVENOUS
  Filled 2018-10-24 (×4): qty 1

## 2018-10-24 MED ORDER — ACETAMINOPHEN 650 MG RE SUPP: 650.0000 mg | Freq: Four times a day (QID) | RECTAL | Status: DC | PRN

## 2018-10-24 MED ORDER — SODIUM CHLORIDE 0.9 % IV SOLN
1.0000 g | INTRAVENOUS | Status: DC
  Administered 2018-10-25 – 2018-10-26 (×2): 1 g via INTRAVENOUS
  Filled 2018-10-24 (×2): qty 1

## 2018-10-24 MED ORDER — SORBITOL 70 % SOLN
30.0000 mL | Freq: Every day | Status: DC | PRN
  Filled 2018-10-24: qty 30

## 2018-10-24 MED ORDER — APIXABAN 2.5 MG PO TABS
2.5000 mg | ORAL_TABLET | Freq: Two times a day (BID) | ORAL | Status: DC
  Administered 2018-10-24 – 2018-10-28 (×8): 2.5 mg via ORAL
  Filled 2018-10-24 (×8): qty 1

## 2018-10-24 MED ORDER — SODIUM CHLORIDE 0.9 % IV SOLN
500.0000 mg | Freq: Once | INTRAVENOUS | Status: AC
  Administered 2018-10-24: 500 mg via INTRAVENOUS
  Filled 2018-10-24: qty 500

## 2018-10-24 MED ORDER — ALLOPURINOL 100 MG PO TABS
100.0000 mg | ORAL_TABLET | Freq: Every day | ORAL | Status: DC
  Administered 2018-10-25 – 2018-10-28 (×4): 100 mg via ORAL
  Filled 2018-10-24 (×4): qty 1

## 2018-10-24 MED ORDER — MAGNESIUM HYDROXIDE 400 MG/5ML PO SUSP: 30.0000 mL | Freq: Every day | ORAL | Status: DC | PRN

## 2018-10-24 MED ORDER — ONDANSETRON HCL 4 MG PO TABS: 4.0000 mg | ORAL_TABLET | Freq: Four times a day (QID) | ORAL | Status: DC | PRN

## 2018-10-24 MED ORDER — SODIUM CHLORIDE 0.9 % IV SOLN
1.0000 g | Freq: Once | INTRAVENOUS | Status: AC
  Administered 2018-10-24: 1 g via INTRAVENOUS
  Filled 2018-10-24: qty 10

## 2018-10-24 MED ORDER — POLYETHYLENE GLYCOL 3350 17 G PO PACK
17.0000 g | PACK | Freq: Every day | ORAL | Status: DC
  Administered 2018-10-24 – 2018-10-28 (×5): 17 g via ORAL
  Filled 2018-10-24 (×5): qty 1

## 2018-10-24 MED ORDER — SODIUM CHLORIDE 0.9% FLUSH
3.0000 mL | Freq: Two times a day (BID) | INTRAVENOUS | Status: DC
  Administered 2018-10-24 – 2018-10-27 (×3): 3 mL via INTRAVENOUS

## 2018-10-24 MED ORDER — CLONIDINE HCL 0.1 MG PO TABS
0.1000 mg | ORAL_TABLET | Freq: Two times a day (BID) | ORAL | Status: DC
  Administered 2018-10-24: 0.1 mg via ORAL
  Filled 2018-10-24: qty 1

## 2018-10-24 MED ORDER — ACETAMINOPHEN 325 MG PO TABS
650.0000 mg | ORAL_TABLET | Freq: Four times a day (QID) | ORAL | Status: DC | PRN
  Administered 2018-10-27: 650 mg via ORAL
  Filled 2018-10-24: qty 2

## 2018-10-24 MED ORDER — FLEET ENEMA 7-19 GM/118ML RE ENEM: 1.0000 | ENEMA | Freq: Once | RECTAL | Status: DC | PRN

## 2018-10-24 MED ORDER — SODIUM CHLORIDE 0.9 % IV SOLN
INTRAVENOUS | Status: DC
  Administered 2018-10-24 – 2018-10-28 (×6): via INTRAVENOUS

## 2018-10-24 MED ORDER — DENOSUMAB 60 MG/ML ~~LOC~~ SOSY: 60.0000 mg | PREFILLED_SYRINGE | SUBCUTANEOUS | Status: DC

## 2018-10-24 MED ORDER — TRAMADOL HCL 50 MG PO TABS
50.0000 mg | ORAL_TABLET | Freq: Two times a day (BID) | ORAL | Status: DC | PRN
  Administered 2018-10-24: 50 mg via ORAL
  Filled 2018-10-24: qty 1

## 2018-10-24 MED ORDER — VITAMIN D3 25 MCG (1000 UNIT) PO TABS
1000.0000 [IU] | ORAL_TABLET | Freq: Every day | ORAL | Status: DC
  Administered 2018-10-25 – 2018-10-28 (×4): 1000 [IU] via ORAL
  Filled 2018-10-24 (×4): qty 1

## 2018-10-24 MED ORDER — INSULIN ASPART 100 UNIT/ML ~~LOC~~ SOLN
0.0000 [IU] | Freq: Three times a day (TID) | SUBCUTANEOUS | Status: DC
  Administered 2018-10-25 (×2): 2 [IU] via SUBCUTANEOUS
  Administered 2018-10-25: 1 [IU] via SUBCUTANEOUS
  Administered 2018-10-26 (×3): 2 [IU] via SUBCUTANEOUS

## 2018-10-24 MED ORDER — INSULIN ASPART 100 UNIT/ML ~~LOC~~ SOLN: 0.0000 [IU] | Freq: Every day | SUBCUTANEOUS | Status: DC

## 2018-10-24 MED ORDER — SENNA 8.6 MG PO TABS
1.0000 | ORAL_TABLET | Freq: Two times a day (BID) | ORAL | Status: DC
  Administered 2018-10-25 – 2018-10-28 (×5): 8.6 mg via ORAL
  Filled 2018-10-24 (×6): qty 1

## 2018-10-24 NOTE — H&P (Signed)
[facility] History and Physical  Sabrina [name] QZR:[id number] DOB: 1926/04/11 DOA: 10/24/2018 Referring physician: ED PCP: [name], MD  Chief Complaint: Burning urination, chills ------------------------------------------------------------------------------------------------------ Assessment/Plan: Active Problems:   UTI (urinary tract infection)  UTI /left flank and left upper quadrant tenderness -Rule out pyelonephritis -Urine culture and blood culture sent from ED. -Obtain CT scan of abdomen because of left flank and left upper quadrant tenderness. -Does not meet criteria for sepsis yet but could be evolving.  Clinical monitor. -Started on IV Rocephin in the ED.  Continue Zosyn.  Acute toxic metabolic encephalopathy -Related to UTI and AKI. -Seems to be resolving.  Continue to monitor.  AKI on CKD stage III -Creatinine 1.7 at baseline, elevated to 2.66 at presentation. -Continue to monitor change with IV hydration.  Cardiovascular issues : hypertension, hyperlipidemia - Prior to initial, patient was on clonidine, Lasix, hydralazine.  - Resume clonidine at a lower dose.  Keep hydralazine and Lasix on hold.  PRN hydralazine ordered.  Diabetes mellitus -Not on insulin.  Keep glipizide on hold because of AKI.  Keep Neurontin on hold until mental status clears.  History of DVT PE -On Eliquis.  Right breast cancer -On denosumab  Mobility: Increase ambulation.  PT evaluation ordered. Diet: Diabetic diet DVT prophylaxis:  Eliquis Code Status:  Full code however, patient is not sure if she had medicine differently in her living well.  Per her power of attorney her sister, patient may be a DNR but is not clear.  In her last admission, patient was managed at a full code.  We may need to have this discussion again once her mental status improves. Family Communication:  Disposition Plan:  Pending PT  evaluation.  ----------------------------------------------------------------------------------------------------- History of Present Illness: Sabrina [name] is a 85 y.o. female with past medical history of hypertension, hyperlipidemia, T2DM, stroke, DVT/PE, CKD3, right breast cancer who lives at home with her husband.  She is able to ambulate with a walker. Patient was brought in the ED today from home with generalized weakness and chills for last 2 days with burning urination.  Home health care nurse also reported altered mental status to EMS.  In the ED, patient had a temperature 100.6, blood pressure 168/76, heart rate 82, breathing comfortably on room air. Labs showed WBC count 8.8, hemoglobin 10.7, platelet 189, sodium 141, potassium 3.9, serum bicarb 30, BUN/creatinine 97/2.66, glucose level 186. COVID-19 test negative Urinalysis showed cloudy yellow urine with large amount of leukocytes, many bacteria and more than 50 WBCs Hospital service was consulted for inpatient admission and management.  At the time of my evaluation, patient was alert, awake, oriented x3 but slow to respond.  She does not believe that she was confused prior to presentation.  She lives at her house with her husband.  She is able to ambulate with a walker. Her sister is the power of attorney.  Patient is a full code.  Review of Systems:  All systems were reviewed and were negative unless otherwise mentioned in the HPI   Past medical history: Past Medical History:  Diagnosis Date   Arthritis    "mostly in my hands, lower back" (06/25/2017)   Basal cell carcinoma (BCC) of face 1983   Breast cancer, right breast ([place]) 03/2013   Chronic kidney disease (CKD), stage III (moderate) ([place])    nephrologist, Dr. [name]   Dental crowns present    DVT (deep venous thrombosis) ([place]) ~ 06/2013   "? side"   Gout    "  on daily RX" (06/25/2017)   Heart murmur    no known problems; states did not know  she had murmur until age 39   High cholesterol    Hypertension    fluctuates, especially when stressed; has been on med. > 20 yr.   Immature cataract    Non-insulin dependent type 2 diabetes mellitus ([place])    Personal history of chemotherapy    Personal history of radiation therapy    Pulmonary embolism ([place]) ~ 06/2013   Radiation 09/06/13-10/20/13   Right Breast Cancer   Stroke ([place]) 05/2017   just visual problems since (06/25/2017)   Wears partial dentures    lower    Past surgical history: Past Surgical History:  Procedure Laterality Date   AXILLARY LYMPH NODE DISSECTION Right 03/30/2013   Procedure: AXILLARY LYMPH NODE DISSECTION;  Surgeon: Rolm Bookbinder, MD;  Location: [facility];  Service: General;  Laterality: Right;   BASAL CELL CARCINOMA EXCISION  1983   "face"   BREAST BIOPSY Right 03/17/2013   BREAST CYST EXCISION Right 11/1958   benign   BREAST LUMPECTOMY Right 03/30/2013   BREAST LUMPECTOMY WITH NEEDLE LOCALIZATION Right 03/30/2013   Procedure: BREAST LUMPECTOMY WITH NEEDLE LOCALIZATION;  Surgeon: Rolm Bookbinder, MD;  Location: [facility];  Service: General;  Laterality: Right;   DILATION AND CURETTAGE OF UTERUS     LOOP RECORDER INSERTION N/A 08/15/2017   Procedure: LOOP RECORDER INSERTION;  Surgeon: Evans Lance, MD;  Location: [facility];  Service: Cardiovascular;  Laterality: N/A;   PORT-A-CATH REMOVAL  2016   PORTACATH PLACEMENT N/A 04/15/2013   Procedure: INSERTION PORT-A-CATH;  Surgeon: Rolm Bookbinder, MD;  Location: [facility];  Service: General;  Laterality: N/A;   RE-EXCISION OF BREAST CANCER,SUPERIOR MARGINS Right 04/15/2013   Procedure: RE-EXCISION OF RIGHT BREAST  MARGINS;  Surgeon: Rolm Bookbinder, MD;  Location: [facility];  Service: General;  Laterality: Right;   TONSILLECTOMY  ~ 1935/1936    Social History:  reports that she has never smoked. She has never used smokeless tobacco. She reports  current alcohol use of about 3.0 standard drinks of alcohol per week. She reports that she does not use drugs.  Allergies:  Allergies  Allergen Reactions   Beta Adrenergic Blockers     Family history:  Family History  Problem Relation Age of Onset   Pneumonia Mother    Heart attack Father    Breast cancer Other 76       niece   Breast cancer Other 81       niece   Ovarian cancer Other        niece     Home Meds: Prior to Admission medications   Medication Sig Start Date End Date Taking? Authorizing Provider  allopurinol (ZYLOPRIM) 100 MG tablet Take 100 mg by mouth daily.  01/17/16  Yes [provider]  apixaban (ELIQUIS) 2.5 MG TABS tablet Take 1 tablet (2.5 mg total) by mouth 2 (two) times daily. 10/07/18  Yes Fenton, Clint R, PA  cholecalciferol (VITAMIN D) 1000 UNITS tablet Take 1,000 Units by mouth daily.   Yes [provider]  cloNIDine (CATAPRES) 0.2 MG tablet Take 0.2 mg by mouth 2 (two) times daily.   Yes [provider]  denosumab (PROLIA) 60 MG/ML SOSY injection Inject 60 mg into the skin every 6 (six) months.   Yes [provider]  furosemide (LASIX) 40 MG tablet Take 40 mg by mouth daily.   Yes [provider]  gabapentin (NEURONTIN) 100 MG capsule Take 200-300 mg by mouth See admin instructions. Take 200 mg by mouth twice a day and take 300 mg by mouth at bedtime   Yes [provider]  glipiZIDE (GLUCOTROL) 5 MG tablet Take 5 mg by mouth 2 (two) times daily before a meal.    Yes [provider]  hydrALAZINE (APRESOLINE) 10 MG tablet Take 1 tablet (10 mg total) by mouth 3 (three) times daily. 09/29/18 12/28/18 Yes Hosie Poisson, MD  hydrocortisone cream 1 % Apply 1 application topically daily as needed for itching.   Yes [provider]  polyethylene glycol (MIRALAX / GLYCOLAX) packet Take 17 g by mouth daily.    Yes [provider]  traMADol (ULTRAM) 50 MG tablet Take 50 mg by mouth 2  (two) times daily as needed for moderate pain.    Yes [provider]    Physical Exam: Vitals:   10/24/18 1400 10/24/18 1430 10/24/18 1500 10/24/18 1515  BP: (!) 146/57 (!) 146/57 (!) 107/49   Pulse: 71 68 61 (!) 59  Resp:   18   Temp:      TempSrc:      SpO2: 94% 94% 98% 94%   Wt Readings from Last 3 Encounters:  10/07/18 74.8 kg  09/26/18 74.8 kg  01/21/18 74.8 kg   There is no height or weight on file to calculate BMI.  General exam: Appears calm and comfortable.  Looks lethargic to me Skin: No rashes, lesions or ulcers. HEENT: Atraumatic, normocephalic, supple neck, no obvious bleeding Lungs: Clear to auscultation bilaterally CVS: Regular rate and rhythm, no murmur GI/Abd soft, minimal tenderness left upper quadrant, nondistended, bowel sounds present CNS: Alert, awake, oriented x3 Psychiatry: Mood appropriate Extremities: No pedal edema, no calf tenderness  Labs on Admission:   CBC: Recent Labs  Lab 10/24/18 1225  WBC 8.8  NEUTROABS 4.8  HGB 10.7*  HCT 33.2*  MCV 101.5*  PLT 644    Basic Metabolic Panel: Recent Labs  Lab 10/24/18 1225  NA 141  K 3.9  CL 99  CO2 30  GLUCOSE 186*  BUN 97*  CREATININE 2.66*  CALCIUM 12.6*    Liver Function Tests: Recent Labs  Lab 10/24/18 1225  AST 14*  ALT 13  ALKPHOS 96  BILITOT 0.8  PROT 7.1  ALBUMIN 3.8   No results for input(s): LIPASE, AMYLASE in the last 168 hours. No results for input(s): AMMONIA in the last 168 hours.  Cardiac Enzymes: No results for input(s): CKTOTAL, CKMB, CKMBINDEX, TROPONINI in the last 168 hours.  BNP (last 3 results) No results for input(s): BNP in the last 8760 hours.  ProBNP (last 3 results) No results for input(s): PROBNP in the last 8760 hours.  CBG: No results for input(s): GLUCAP in the last 168 hours.  Lipase     Component Value Date/Time   LIPASE 45 08/15/2017 2054     Urinalysis    Component Value Date/Time   COLORURINE YELLOW 10/24/2018  1206   APPEARANCEUR CLOUDY (A) 10/24/2018 1206   LABSPEC 1.012 10/24/2018 1206   PHURINE 5.0 10/24/2018 1206   GLUCOSEU NEGATIVE 10/24/2018 1206   HGBUR NEGATIVE 10/24/2018 [place] 10/24/2018 1206   KETONESUR NEGATIVE 10/24/2018 1206   PROTEINUR 100 (A) 10/24/2018 1206   UROBILINOGEN 0.2 09/13/2013 0523   NITRITE NEGATIVE 10/24/2018 1206   LEUKOCYTESUR LARGE (A) 10/24/2018 1206     Drugs of Abuse  No results found for:  LABOPIA, COCAINSCRNUR, LABBENZ, AMPHETMU, THCU, LABBARB    Radiological Exams on Admission: Ct Abdomen Pelvis Wo Contrast  Result Date: 10/24/2018 CLINICAL DATA:  Abdominal pain. Left upper quadrant and flank tenderness. UTI. EXAM: CT ABDOMEN AND PELVIS WITHOUT CONTRAST TECHNIQUE: Multidetector CT imaging of the abdomen and pelvis was performed following the standard protocol without IV contrast. COMPARISON:  None. FINDINGS: Lower chest: 4 mm right lower lobe pulmonary nodule stable since chest CT of 08/20/2015, consistent with benign etiology such as scarring. Mitral annular calcification noted. Hepatobiliary: No focal abnormality in the liver on this study without intravenous contrast. Calcified gallstones noted. No intrahepatic or extrahepatic biliary dilation. Pancreas: No focal mass lesion. No dilatation of the main duct. No intraparenchymal cyst. No peripancreatic edema. Spleen: No splenomegaly. No focal mass lesion. Adrenals/Urinary Tract: No adrenal nodule or mass. No renal stones or hydronephrosis in either kidney. No gross renal mass on this noncontrast study. No evidence for hydroureter. The urinary bladder appears normal for the degree of distention. Stomach/Bowel: Tiny hiatal hernia. Stomach otherwise unremarkable. Duodenum is normally positioned as is the ligament of Treitz. No small bowel wall thickening. No small bowel dilatation. The terminal ileum is normal. The appendix is not visualized, but there is no edema or inflammation in the region of  the cecum. No gross colonic mass. No colonic wall thickening. Diverticular changes are noted in the left colon without evidence of diverticulitis. Vascular/Lymphatic: There is abdominal aortic atherosclerosis without aneurysm. There is no gastrohepatic or hepatoduodenal ligament lymphadenopathy. No intraperitoneal or retroperitoneal lymphadenopathy. No pelvic sidewall lymphadenopathy. Reproductive: Unremarkable. Other: No intraperitoneal free fluid. Musculoskeletal: No worrisome lytic or sclerotic osseous abnormality. Small paraumbilical midline hernia contains small bowel without complicating features. Hernia sac measures approximately 6.2 x 3.3 x 7.2 cm. IMPRESSION: 1. No acute findings in the abdomen or pelvis. No findings to explain the patient's history of left upper quadrant flank tenderness. 2. Paraumbilical midline ventral hernia contains small bowel without complicating features. 3. Tiny hiatal hernia. 4.  Aortic Atherosclerois (ICD10-170.0) Electronically Signed   By: Misty Stanley M.D.   On: 10/24/2018 16:28   Dg Chest 2 View  Result Date: 10/24/2018 CLINICAL DATA:  Weakness and shaking for 2 days. EXAM: CHEST - 2 VIEW COMPARISON:  08/15/2017 FINDINGS: Nodular opacity in the right apex is new in the interval. Patchy airspace opacity is seen in the peripheral right upper lobe. Left lung clear. No pleural effusion. Cardiopericardial silhouette is at upper limits of normal for size. The visualized bony structures of the thorax are intact. IMPRESSION: Patchy airspace disease right upper lobe suspicious for pneumonia. Nodular density in the right apex is new in the interval since prior study. CT chest without contrast recommended to further evaluate. Electronically Signed   By: Misty Stanley M.D.   On: 10/24/2018 12:34   ----------------------------------------------------------------------------------------------------------------------------------------------------------- Severity of Illness: The  appropriate patient status for this patient is INPATIENT. Inpatient status is judged to be reasonable and necessary in order to provide the required intensity of service to ensure the patient's safety. The patient's presenting symptoms, physical exam findings, and initial radiographic and laboratory data in the context of their chronic comorbidities is felt to place them at high risk for further clinical deterioration. Furthermore, it is not anticipated that the patient will be medically stable for discharge from the hospital within 2 midnights of admission. The following factors support the patient status of inpatient.   " The patient's presenting symptoms include chills, burning urination, generalized weakness. " The worrisome physical exam  findings include abdominal tenderness. " The initial radiographic and laboratory data are worrisome because of abnormal urinalysis. " The chronic co-morbidities include diabetes mellitus, history of cancer.   * I certify that at the point of admission it is my clinical judgment that the patient will require inpatient hospital care spanning beyond 2 midnights from the point of admission due to high intensity of service, high risk for further deterioration and high frequency of surveillance required.*   Signed, Terrilee Croak, MD [facility] 10/24/2018

## 2018-10-24 NOTE — ED Triage Notes (Signed)
Patient presents from home with general weakness ans shaking for 2 days. She also complains of burning with urination. Her home health care RN reported to EMS that they patient has AMS.    EMS vitals: 100.6 temp 168/76 BP 82 HR 16 Resp Rate  95% O2 sat on room air

## 2018-10-24 NOTE — Progress Notes (Addendum)
error 

## 2018-10-24 NOTE — Progress Notes (Signed)
ED TO INPATIENT HANDOFF REPORT  Name/Age/Gender [facility] 85 y.o. female  Code Status    Code Status Orders  (From admission, onward)         Start     Ordered   10/24/18 1444  Full code  Continuous     10/24/18 1445        Code Status History    Date Active Date Inactive Code Status Order ID Comments User Context   09/26/2018 1733 09/29/2018 2318 Full Code [id number]  Barb Merino, MD Inpatient   06/25/2017 0333 06/27/2017 1736 Full Code [id number]  Vianne Bulls, MD ED   05/30/2017 1638 06/04/2017 1826 Full Code [id number]  Rondel Jumbo, PA-C ED   05/27/2017 1210 05/28/2017 2114 Full Code [id number]  Patrecia Pour, MD ED   09/13/2013 1055 09/14/2013 1745 Full Code [id number]  Reyne Dumas, MD ED   06/10/2013 2100 06/19/2013 1940 Full Code [id number]  Mendel Corning, MD Inpatient   04/15/2013 2004 04/16/2013 1156 Full Code [id number]  Rolm Bookbinder, MD Inpatient   03/30/2013 2104 03/31/2013 2039 Full Code [id number]  Rolm Bookbinder, MD Inpatient   Advance Care Planning Activity      Home/SNF/Other Home  Chief Complaint Weakness; Possible UTI  Level of Care/Admitting Diagnosis ED Disposition    ED Disposition Condition [place] [facility]: [facility] [[id number]]  Level of Care: Med-Surg [16]  Covid Evaluation: Confirmed COVID Negative  Date Laboratory Confirmed COVID Negative: 10/24/2018  Diagnosis: UTI (urinary tract infection) [097353]  Admitting Physician: Terrilee Croak [[id number]]  Attending Physician: Terrilee Croak [[id number]]  Estimated length of stay: past midnight tomorrow  Certification:: I certify this patient will need inpatient services for at least 2 midnights  PT Class (Do Not Modify): Inpatient [101]  PT Acc Code (Do Not Modify): Private [1]       Medical History Past Medical History:  Diagnosis Date  . Arthritis    "mostly in my hands, lower back" (06/25/2017)  . Basal cell carcinoma (BCC) of face 1983  . Breast  cancer, right breast ([place]) 03/2013  . Chronic kidney disease (CKD), stage III (moderate) ([facility])    nephrologist, Dr. [name]  . Dental crowns present   . DVT (deep venous thrombosis) ([place]) ~ 06/2013   "? side"  . Gout    "on daily RX" (06/25/2017)  . Heart murmur    no known problems; states did not know she had murmur until age 30  . High cholesterol   . Hypertension    fluctuates, especially when stressed; has been on med. > 20 yr.  . Immature cataract   . Non-insulin dependent type 2 diabetes mellitus ([place])   . Personal history of chemotherapy   . Personal history of radiation therapy   . Pulmonary embolism ([place]) ~ 06/2013  . Radiation 09/06/13-10/20/13   Right Breast Cancer  . Stroke ([place]) 05/2017   just visual problems since (06/25/2017)  . Wears partial dentures    lower    Allergies Allergies  Allergen Reactions  . Beta Adrenergic Blockers     IV Location/Drains/Wounds Patient Lines/Drains/Airways Status   Active Line/Drains/Airways    Name:   Placement date:   Placement time:   Site:   Days:   Peripheral IV 10/24/18 Left;Distal Forearm   10/24/18    1224    Forearm   less than 1          Labs/Imaging Results for orders placed or performed  during the hospital encounter of 10/24/18 (from the past 48 hour(s))  Urinalysis, Routine w reflex microscopic     Status: Abnormal   Collection Time: 10/24/18 12:06 PM  Result Value Ref Range   Color, Urine YELLOW YELLOW   APPearance CLOUDY (A) CLEAR   Specific Gravity, Urine 1.012 1.005 - 1.030   pH 5.0 5.0 - 8.0   Glucose, UA NEGATIVE NEGATIVE mg/dL   Hgb urine dipstick NEGATIVE NEGATIVE   Bilirubin Urine NEGATIVE NEGATIVE   Ketones, ur NEGATIVE NEGATIVE mg/dL   Protein, ur 100 (A) NEGATIVE mg/dL   Nitrite NEGATIVE NEGATIVE   Leukocytes,Ua LARGE (A) NEGATIVE   WBC, UA >50 (H) 0 - 5 WBC/hpf   Bacteria, UA MANY (A) NONE SEEN   Squamous Epithelial / LPF 11-20 0 - 5   Mucus PRESENT    Budding Yeast PRESENT     Hyaline Casts, UA PRESENT    Non Squamous Epithelial 0-5 (A) NONE SEEN    Comment: Performed at [facility], [place] [address], [place], [place] [address]  Comprehensive metabolic panel     Status: Abnormal   Collection Time: 10/24/18 12:25 PM  Result Value Ref Range   Sodium 141 135 - 145 mmol/L   Potassium 3.9 3.5 - 5.1 mmol/L   Chloride 99 98 - 111 mmol/L   CO2 30 22 - 32 mmol/L   Glucose, Bld 186 (H) 70 - 99 mg/dL   BUN 97 (H) 8 - 23 mg/dL   Creatinine, Ser 2.66 (H) 0.44 - 1.00 mg/dL   Calcium 12.6 (H) 8.9 - 10.3 mg/dL   Total Protein 7.1 6.5 - 8.1 g/dL   Albumin 3.8 3.5 - 5.0 g/dL   AST 14 (L) 15 - 41 U/L   ALT 13 0 - 44 U/L   Alkaline Phosphatase 96 38 - 126 U/L   Total Bilirubin 0.8 0.3 - 1.2 mg/dL   GFR calc non Af Amer 15 (L) >60 mL/min   GFR calc Af Amer 18 (L) >60 mL/min   Anion gap 12 5 - 15    Comment: Performed at [facility], [place] [address] [place], [place] [address]  CBC with Differential     Status: Abnormal   Collection Time: 10/24/18 12:25 PM  Result Value Ref Range   WBC 8.8 4.0 - 10.5 K/uL   RBC 3.27 (L) 3.87 - 5.11 MIL/uL   Hemoglobin 10.7 (L) 12.0 - 15.0 g/dL   HCT 33.2 (L) 36.0 - 46.0 %   MCV 101.5 (H) 80.0 - 100.0 fL   MCH 32.7 26.0 - 34.0 pg   MCHC 32.2 30.0 - 36.0 g/dL   RDW 14.0 11.5 - 15.5 %   Platelets 189 150 - 400 K/uL   nRBC 0.0 0.0 - 0.2 %   Neutrophils Relative % 55 %   Neutro Abs 4.8 1.7 - 7.7 K/uL   Lymphocytes Relative 34 %   Lymphs Abs 3.0 0.7 - 4.0 K/uL   Monocytes Relative 7 %   Monocytes Absolute 0.6 0.1 - 1.0 K/uL   Eosinophils Relative 4 %   Eosinophils Absolute 0.3 0.0 - 0.5 K/uL   Basophils Relative 0 %   Basophils Absolute 0.0 0.0 - 0.1 K/uL   Immature Granulocytes 0 %   Abs Immature Granulocytes 0.02 0.00 - 0.07 K/uL    Comment: Performed at [facility], [place] [address], [place], [place] [address]  SARS Coronavirus 2 [facility] order, Performed in [facility]  hospital lab)  Nasopharyngeal Nasopharyngeal Swab     Status: None   Collection Time: 10/24/18  1:43 PM   Specimen: Nasopharyngeal Swab  Result Value Ref Range   SARS Coronavirus 2 NEGATIVE NEGATIVE    Comment: (NOTE) If result is NEGATIVE SARS-CoV-2 target nucleic acids are NOT DETECTED. The SARS-CoV-2 RNA is generally detectable in upper and lower  respiratory specimens during the acute phase of infection. The lowest  concentration of SARS-CoV-2 viral copies this assay can detect is 250  copies / mL. A negative result does not preclude SARS-CoV-2 infection  and should not be used as the sole basis for treatment or other  patient management decisions.  A negative result may occur with  improper specimen collection / handling, submission of specimen other  than nasopharyngeal swab, presence of viral mutation(s) within the  areas targeted by this assay, and inadequate number of viral copies  (<250 copies / mL). A negative result must be combined with clinical  observations, patient history, and epidemiological information. If result is POSITIVE SARS-CoV-2 target nucleic acids are DETECTED. The SARS-CoV-2 RNA is generally detectable in upper and lower  respiratory specimens dur ing the acute phase of infection.  Positive  results are indicative of active infection with SARS-CoV-2.  Clinical  correlation with patient history and other diagnostic information is  necessary to determine patient infection status.  Positive results do  not rule out bacterial infection or co-infection with other viruses. If result is PRESUMPTIVE POSTIVE SARS-CoV-2 nucleic acids MAY BE PRESENT.   A presumptive positive result was obtained on the submitted specimen  and confirmed on repeat testing.  While 2019 novel coronavirus  (SARS-CoV-2) nucleic acids may be present in the submitted sample  additional confirmatory testing may be necessary for epidemiological  and / or clinical management purposes  to  differentiate between  SARS-CoV-2 and other Sarbecovirus currently known to infect humans.  If clinically indicated additional testing with an alternate test  methodology [phone]) is advised. The SARS-CoV-2 RNA is generally  detectable in upper and lower respiratory sp ecimens during the acute  phase of infection. The expected result is Negative. Fact Sheet for Patients:  StrictlyIdeas.no Fact Sheet for Healthcare Providers: BankingDealers.co.za This test is not yet approved or cleared by the [place] FDA and has been authorized for detection and/or diagnosis of SARS-CoV-2 by FDA under an Emergency Use Authorization (EUA).  This EUA will remain in effect (meaning this test can be used) for the duration of the COVID-19 declaration under Section 564(b)(1) of the Act, 21 U.S.C. section 360bbb-3(b)(1), unless the authorization is terminated or revoked sooner. Performed at [facility], [place] [address], [place], [place] [address]    Dg Chest 2 View  Result Date: 10/24/2018 CLINICAL DATA:  Weakness and shaking for 2 days. EXAM: CHEST - 2 VIEW COMPARISON:  08/15/2017 FINDINGS: Nodular opacity in the right apex is new in the interval. Patchy airspace opacity is seen in the peripheral right upper lobe. Left lung clear. No pleural effusion. Cardiopericardial silhouette is at upper limits of normal for size. The visualized bony structures of the thorax are intact. IMPRESSION: Patchy airspace disease right upper lobe suspicious for pneumonia. Nodular density in the right apex is new in the interval since prior study. CT chest without contrast recommended to further evaluate. Electronically Signed   By: Misty Stanley M.D.   On: 10/24/2018 12:34    Pending Labs [employer] (From admission, onward)    Start     Ordered  10/25/18 7290  Basic metabolic panel  Daily,   R     10/24/18 1445   10/25/18 0500  CBC  Daily,   R      10/24/18 1445   10/24/18 1442  Blood culture (routine x 2)  BLOOD CULTURE X 2,   STAT     10/24/18 1441   10/24/18 1232  Urine culture  ONCE - STAT,   STAT    Question:  Patient immune status  Answer:  Normal   10/24/18 1232          Vitals/Pain Today's Vitals   10/24/18 1400 10/24/18 1430 10/24/18 1500 10/24/18 1515  BP: (!) 146/57 (!) 146/57 (!) 107/49   Pulse: 71 68 61 (!) 59  Resp:   18   Temp:      TempSrc:      SpO2: 94% 94% 98% 94%  PainSc:        Isolation Precautions Airborne and Contact precautions  Medications Medications  azithromycin (ZITHROMAX) 500 mg in sodium chloride 0.9 % 250 mL IVPB (500 mg Intravenous New Bag/Given 10/24/18 1433)  sodium chloride flush (NS) 0.9 % injection 3 mL (has no administration in time range)  acetaminophen (TYLENOL) tablet 650 mg (has no administration in time range)    Or  acetaminophen (TYLENOL) suppository 650 mg (has no administration in time range)  senna (SENOKOT) tablet 8.6 mg (has no administration in time range)  sorbitol 70 % solution 30 mL (has no administration in time range)  sodium phosphate (FLEET) 7-19 GM/118ML enema 1 enema (has no administration in time range)  ondansetron (ZOFRAN) tablet 4 mg (has no administration in time range)    Or  ondansetron (ZOFRAN) injection 4 mg (has no administration in time range)  0.9 %  sodium chloride infusion (has no administration in time range)  cefTRIAXone (ROCEPHIN) 1 g in sodium chloride 0.9 % 100 mL IVPB (0 g Intravenous Stopped 10/24/18 1412)    Mobility walks with device

## 2018-10-24 NOTE — ED Notes (Signed)
Attempted to call report. Receiving RN will call back when she is available.

## 2018-10-24 NOTE — ED Notes (Addendum)
Urine culture sent with UA sample if needed

## 2018-10-24 NOTE — ED Provider Notes (Signed)
[facility] DEPT Provider Note   CSN: [id number] Arrival date & time: 10/24/18  1114     History   Chief Complaint Chief Complaint  Patient presents with  . Weakness  . Dysuria    HPI Sabrina [name] is a 85 y.o. female BIB EMS for evaluation of chills, generalized weakness, and dysuria that has been ongoing since yesterday.  She states yesterday, she started having some chills.  She states she did not noted any fever.  She states she is just had some generalized weakness and has just felt more fatigued.  She states she has not had any abdominal pain, nausea/vomiting.  She has known dysuria but has not noted any hematuria.  She states that she tried to take Tylenol for chills which temporarily helped but then they returned today.  She felt like this morning, they were worse and she was having some worse generalized weakness, prompting EMS call.  Patient states that she is not any travel, denies any recent COVID exposure.  She denies any vision changes, chest pain, difficulty breathing, cough, abdominal pain, nausea/vomiting, numbness/weakness of arms or legs.     The history is provided by the patient and the EMS personnel.    Past Medical History:  Diagnosis Date  . Arthritis    "mostly in my hands, lower back" (06/25/2017)  . Basal cell carcinoma (BCC) of face 1983  . Breast cancer, right breast ([facility]) 03/2013  . Chronic kidney disease (CKD), stage III (moderate) (HCC)    nephrologist, Dr. [name]  . Dental crowns present   . DVT (deep venous thrombosis) ([place]) ~ 06/2013   "? side"  . Gout    "on daily RX" (06/25/2017)  . Heart murmur    no known problems; states did not know she had murmur until age 46  . High cholesterol   . Hypertension    fluctuates, especially when stressed; has been on med. > 20 yr.  . Immature cataract   . Non-insulin dependent type 2 diabetes mellitus ([place])   . Personal history of chemotherapy   . Personal  history of radiation therapy   . Pulmonary embolism ([place]) ~ 06/2013  . Radiation 09/06/13-10/20/13   Right Breast Cancer  . Stroke ([place]) 05/2017   just visual problems since (06/25/2017)  . Wears partial dentures    lower    Patient Active Problem List   Diagnosis Date Noted  . UTI (urinary tract infection) 10/24/2018  . Clavicle fracture 09/26/2018  . Blurry vision, bilateral   . Acute blood loss anemia   . History of breast cancer 05/30/2017  . Weakness 05/30/2017  . Occipital infarction ([place]) 05/30/2017  . Pressure injury of skin 05/28/2017  . Fall at home 05/27/2017  . CKD (chronic kidney disease), stage IV ([place]) 05/27/2017  . High cholesterol 05/27/2017  . DM (diabetes mellitus) ([place]) 05/27/2017  . Hypertension 05/27/2017  . Gout 05/27/2017  . Elevated troponin 05/27/2017  . Lipoma of lower extremity 09/12/2014  . Abnormal x-ray 03/15/2014  . Chronic diastolic congestive heart failure ([place]) 02/23/2014  . Osteopenia 11/30/2013  . Hypoglycemia 09/13/2013  . Acute respiratory failure with hypoxia ([place]) 06/14/2013  . History of pulmonary embolism 06/10/2013  . Nausea and vomiting 06/10/2013  . Accelerated hypertension 06/10/2013  . Aortic stenosis 04/22/2013  . Edema leg 04/22/2013  . Breast cancer of upper-outer quadrant of right female breast ([facility]) 03/19/2013    Past Surgical History:  Procedure Laterality Date  . AXILLARY LYMPH  NODE DISSECTION Right 03/30/2013   Procedure: AXILLARY LYMPH NODE DISSECTION;  Surgeon: Rolm Bookbinder, MD;  Location: [facility];  Service: General;  Laterality: Right;  . BASAL CELL CARCINOMA EXCISION  1983   "face"  . BREAST BIOPSY Right 03/17/2013  . BREAST CYST EXCISION Right 11/1958   benign  . BREAST LUMPECTOMY Right 03/30/2013  . BREAST LUMPECTOMY WITH NEEDLE LOCALIZATION Right 03/30/2013   Procedure: BREAST LUMPECTOMY WITH NEEDLE LOCALIZATION;  Surgeon: Rolm Bookbinder, MD;  Location: [facility];  Service: General;  Laterality: Right;  .  DILATION AND CURETTAGE OF UTERUS    . LOOP RECORDER INSERTION N/A 08/15/2017   Procedure: LOOP RECORDER INSERTION;  Surgeon: Evans Lance, MD;  Location: [facility];  Service: Cardiovascular;  Laterality: N/A;  . PORT-A-CATH REMOVAL  2016  . PORTACATH PLACEMENT N/A 04/15/2013   Procedure: INSERTION PORT-A-CATH;  Surgeon: Rolm Bookbinder, MD;  Location: [facility];  Service: General;  Laterality: N/A;  . RE-EXCISION OF BREAST CANCER,SUPERIOR MARGINS Right 04/15/2013   Procedure: RE-EXCISION OF RIGHT BREAST  MARGINS;  Surgeon: Rolm Bookbinder, MD;  Location: [facility];  Service: General;  Laterality: Right;  . TONSILLECTOMY  ~ 1935/1936     OB History   No obstetric history on file.    Obstetric Comments  Menarche ae 14 No children Married 48 years         Home Medications    Prior to Admission medications   Medication Sig Start Date End Date Taking? Authorizing Provider  allopurinol (ZYLOPRIM) 100 MG tablet Take 100 mg by mouth daily.  01/17/16  Yes [provider]  apixaban (ELIQUIS) 2.5 MG TABS tablet Take 1 tablet (2.5 mg total) by mouth 2 (two) times daily. 10/07/18  Yes Fenton, Clint R, PA  cholecalciferol (VITAMIN D) 1000 UNITS tablet Take 1,000 Units by mouth daily.   Yes [provider]  cloNIDine (CATAPRES) 0.2 MG tablet Take 0.2 mg by mouth 2 (two) times daily.   Yes [provider]  denosumab (PROLIA) 60 MG/ML SOSY injection Inject 60 mg into the skin every 6 (six) months.   Yes [provider]  furosemide (LASIX) 40 MG tablet Take 40 mg by mouth daily.   Yes [provider]  gabapentin (NEURONTIN) 100 MG capsule Take 200-300 mg by mouth See admin instructions. Take 200 mg by mouth twice a day and take 300 mg by mouth at bedtime   Yes [provider]  glipiZIDE (GLUCOTROL) 5 MG tablet Take 5 mg by mouth 2 (two) times daily before a meal.    Yes [provider]   hydrALAZINE (APRESOLINE) 10 MG tablet Take 1 tablet (10 mg total) by mouth 3 (three) times daily. 09/29/18 12/28/18 Yes Hosie Poisson, MD  hydrocortisone cream 1 % Apply 1 application topically daily as needed for itching.   Yes [provider]  polyethylene glycol (MIRALAX / GLYCOLAX) packet Take 17 g by mouth daily.    Yes [provider]  traMADol (ULTRAM) 50 MG tablet Take 50 mg by mouth 2 (two) times daily as needed for moderate pain.    Yes [provider]    Family History Family History  Problem Relation Age of Onset  . Pneumonia Mother   . Heart attack Father   . Breast cancer Other 73       niece  . Breast cancer Other 90       niece  . Ovarian cancer Other  niece    Social History Social History   Tobacco Use  . Smoking status: Never Smoker  . Smokeless tobacco: Never Used  . Tobacco comment: only smoked 2 packs cigarettes total; husband quit in 1971  Substance Use Topics  . Alcohol use: Yes    Alcohol/week: 3.0 standard drinks    Types: 3 Glasses of wine per week  . Drug use: No     Allergies   Beta adrenergic blockers   Review of Systems Review of Systems  Constitutional: Positive for chills. Negative for fever.  Respiratory: Negative for cough and shortness of breath.   Cardiovascular: Negative for chest pain.  Gastrointestinal: Negative for abdominal pain, nausea and vomiting.  Genitourinary: Positive for dysuria. Negative for hematuria.  Neurological: Positive for weakness (generalized). Negative for headaches.  All other systems reviewed and are negative.    Physical Exam Updated Vital Signs BP (!) 146/57 (BP Location: Left Arm)   Pulse 61   Temp 98.4 F (36.9 C) (Rectal)   Resp 18   SpO2 98%   Physical Exam Vitals signs and nursing note reviewed.  Constitutional:      Appearance: Normal appearance. She is well-developed.  HENT:     Head: Normocephalic and atraumatic.  Eyes:     General: Lids are  normal.     Conjunctiva/sclera: Conjunctivae normal.     Pupils: Pupils are equal, round, and reactive to light.  Neck:     Musculoskeletal: Full passive range of motion without pain.  Cardiovascular:     Rate and Rhythm: Normal rate and regular rhythm.     Pulses: Normal pulses.     Heart sounds: Normal heart sounds. No murmur. No friction rub. No gallop.   Pulmonary:     Effort: Pulmonary effort is normal.     Breath sounds: Normal breath sounds.     Comments: Lungs clear to auscultation bilaterally.  Symmetric chest rise.  No wheezing, rales, rhonchi. Abdominal:     Palpations: Abdomen is soft. Abdomen is not rigid.     Tenderness: There is no abdominal tenderness. There is no guarding.     Comments: Abdomen is soft, non-distended, non-tender. No rigidity, No guarding. No peritoneal signs.  Musculoskeletal: Normal range of motion.  Skin:    General: Skin is warm and dry.     Capillary Refill: Capillary refill takes less than 2 seconds.  Neurological:     Mental Status: She is alert and oriented to person, place, and time.     Comments: Cranial nerves III-XII intact Follows commands, Moves all extremities  5/5 strength to BUE and BLE  Sensation intact throughout all major nerve distributions Normal coordination  No slurred speech. No facial droop.  Alert and Oriented x 3  Answers questions without any difficulty  Psychiatric:        Speech: Speech normal.      ED Treatments / Results  Labs (all labs ordered are listed, but only abnormal results are displayed) Labs Reviewed  COMPREHENSIVE METABOLIC PANEL - Abnormal; Notable for the following components:      Result Value   Glucose, Bld 186 (*)    BUN 97 (*)    Creatinine, Ser 2.66 (*)    Calcium 12.6 (*)    AST 14 (*)    GFR calc non Af Amer 15 (*)    GFR calc Af Amer 18 (*)    All other components within normal limits  CBC WITH DIFFERENTIAL/PLATELET - Abnormal; Notable for the following  components:   RBC 3.27 (*)     Hemoglobin 10.7 (*)    HCT 33.2 (*)    MCV 101.5 (*)    All other components within normal limits  URINALYSIS, ROUTINE W REFLEX MICROSCOPIC - Abnormal; Notable for the following components:   APPearance CLOUDY (*)    Protein, ur 100 (*)    Leukocytes,Ua LARGE (*)    WBC, UA >50 (*)    Bacteria, UA MANY (*)    Non Squamous Epithelial 0-5 (*)    All other components within normal limits  SARS CORONAVIRUS 2 (HOSPITAL ORDER, [facility])  URINE CULTURE  CULTURE, BLOOD (ROUTINE X 2)  CULTURE, BLOOD (ROUTINE X 2)    EKG None  Radiology Dg Chest 2 View  Result Date: 10/24/2018 CLINICAL DATA:  Weakness and shaking for 2 days. EXAM: CHEST - 2 VIEW COMPARISON:  08/15/2017 FINDINGS: Nodular opacity in the right apex is new in the interval. Patchy airspace opacity is seen in the peripheral right upper lobe. Left lung clear. No pleural effusion. Cardiopericardial silhouette is at upper limits of normal for size. The visualized bony structures of the thorax are intact. IMPRESSION: Patchy airspace disease right upper lobe suspicious for pneumonia. Nodular density in the right apex is new in the interval since prior study. CT chest without contrast recommended to further evaluate. Electronically Signed   By: Misty Stanley M.D.   On: 10/24/2018 12:34    Procedures Procedures (including critical care time)  Medications Ordered in ED Medications  azithromycin (ZITHROMAX) 500 mg in sodium chloride 0.9 % 250 mL IVPB (500 mg Intravenous New Bag/Given 10/24/18 1433)  sodium chloride flush (NS) 0.9 % injection 3 mL (has no administration in time range)  acetaminophen (TYLENOL) tablet 650 mg (has no administration in time range)    Or  acetaminophen (TYLENOL) suppository 650 mg (has no administration in time range)  senna (SENOKOT) tablet 8.6 mg (has no administration in time range)  sorbitol 70 % solution 30 mL (has no administration in time range)  sodium phosphate  (FLEET) 7-19 GM/118ML enema 1 enema (has no administration in time range)  ondansetron (ZOFRAN) tablet 4 mg (has no administration in time range)    Or  ondansetron (ZOFRAN) injection 4 mg (has no administration in time range)  0.9 %  sodium chloride infusion (has no administration in time range)  cefTRIAXone (ROCEPHIN) 1 g in sodium chloride 0.9 % 100 mL IVPB (1 g Intravenous New Bag/Given 10/24/18 1342)     Initial Impression / Assessment and Plan / ED Course  I have reviewed the triage vital signs and the nursing notes.  Pertinent labs & imaging results that were available during my care of the patient were reviewed by me and considered in my medical decision making (see chart for details).        85 year old female who presents for evaluation of chills, generalized weakness, dysuria.  No fevers.  No nausea/vomiting, abdominal pain.  On initial ED arrival, she is afebrile.  She is sitting on exam table no signs of acute distress.  She is slightly hypertensive but vitals otherwise stable.  No neuro deficits noted on exam.  Benign abdominal exam.  We will plan to check labs, urine.  Chest x-ray shows patchy airspace disease in right upper lobe suspicious for pneumonia.  There is also a nodular density in the right apex.  Urine shows large leukocytes, pyuria, bacteria.  Plan to treat.  Urine culture sent.  CMP shows creatinine of 2.66, BUN of 97.  Review of records show that she consistently and is around 1.8.  She has had a creatinine of 2 previously.  CBC with no significant leuko-cytosis.  Hemoglobin stable at 10.7 which is consistent with prior.  Given concerns for a UTI versus possible pneumonia, will plan to give her dose of antibiotics here in the ED.  Given generalized weakness, as well as findings work-up, feel that patient needs admission for further treatment.   Updated patient on plan.  She is agreeable.  Discussed with hospitalist. Accepts patient for admission.   Portions of  this note were generated with [employer]. Dictation errors may occur despite best attempts at proofreading.    Final Clinical Impressions(s) / ED Diagnoses   Final diagnoses:  Acute cystitis with hematuria  Acute kidney injury [facility])    ED Discharge Orders    None       Desma Mcgregor 10/24/18 1520    [name], MD 10/26/18 1012

## 2018-10-25 DIAGNOSIS — J189 Pneumonia, unspecified organism: Secondary | ICD-10-CM

## 2018-10-25 DIAGNOSIS — Z86718 Personal history of other venous thrombosis and embolism: Secondary | ICD-10-CM

## 2018-10-25 DIAGNOSIS — N179 Acute kidney failure, unspecified: Secondary | ICD-10-CM

## 2018-10-25 DIAGNOSIS — Z853 Personal history of malignant neoplasm of breast: Secondary | ICD-10-CM

## 2018-10-25 DIAGNOSIS — N390 Urinary tract infection, site not specified: Secondary | ICD-10-CM

## 2018-10-25 DIAGNOSIS — R509 Fever, unspecified: Secondary | ICD-10-CM

## 2018-10-25 LAB — CBC
HCT: 32.9 % — ABNORMAL LOW (ref 36.0–46.0)
Hemoglobin: 10.6 g/dL — ABNORMAL LOW (ref 12.0–15.0)
MCH: 32.7 pg (ref 26.0–34.0)
MCHC: 32.2 g/dL (ref 30.0–36.0)
MCV: 101.5 fL — ABNORMAL HIGH (ref 80.0–100.0)
Platelets: 200 10*3/uL (ref 150–400)
RBC: 3.24 MIL/uL — ABNORMAL LOW (ref 3.87–5.11)
RDW: 14.1 % (ref 11.5–15.5)
WBC: 12.5 10*3/uL — ABNORMAL HIGH (ref 4.0–10.5)
nRBC: 0 % (ref 0.0–0.2)

## 2018-10-25 LAB — GLUCOSE, CAPILLARY
Glucose-Capillary: 184 mg/dL — ABNORMAL HIGH (ref 70–99)
Glucose-Capillary: 165 mg/dL — ABNORMAL HIGH (ref 70–99)
Glucose-Capillary: 148 mg/dL — ABNORMAL HIGH (ref 70–99)
Glucose-Capillary: 139 mg/dL — ABNORMAL HIGH (ref 70–99)

## 2018-10-25 LAB — HEMOGLOBIN A1C
Hgb A1c MFr Bld: 7.3 % — ABNORMAL HIGH (ref 4.8–5.6)
Mean Plasma Glucose: 162.81 mg/dL

## 2018-10-25 LAB — BASIC METABOLIC PANEL
Anion gap: 14 (ref 5–15)
BUN: 83 mg/dL — ABNORMAL HIGH (ref 8–23)
CO2: 27 mmol/L (ref 22–32)
Calcium: 12.2 mg/dL — ABNORMAL HIGH (ref 8.9–10.3)
Chloride: 104 mmol/L (ref 98–111)
Creatinine, Ser: 2.46 mg/dL — ABNORMAL HIGH (ref 0.44–1.00)
GFR calc Af Amer: 19 mL/min — ABNORMAL LOW (ref >60)
GFR calc non Af Amer: 17 mL/min — ABNORMAL LOW (ref >60)
Glucose, Bld: 232 mg/dL — ABNORMAL HIGH (ref 70–99)
Potassium: 4.2 mmol/L (ref 3.5–5.1)
Sodium: 145 mmol/L (ref 135–145)

## 2018-10-25 MED ORDER — DILTIAZEM HCL 25 MG/5ML IV SOLN
10.0000 mg | Freq: Once | INTRAVENOUS | Status: AC
  Administered 2018-10-25: 10 mg via INTRAVENOUS
  Filled 2018-10-25: qty 5

## 2018-10-25 MED ORDER — CLONIDINE HCL 0.1 MG PO TABS
0.2000 mg | ORAL_TABLET | Freq: Two times a day (BID) | ORAL | Status: DC
  Administered 2018-10-25 – 2018-10-28 (×7): 0.2 mg via ORAL
  Filled 2018-10-25 (×9): qty 2

## 2018-10-25 NOTE — Progress Notes (Signed)
[name] over the night had a yellow MEWS score due to elevated BP and pulse.  Patient was given numerous BP medications, catapres 0.2mg  was the most effective.  Patient's BP and pulse were much better this am.  Day shift will monitor vitals closely today.Roderick Pee

## 2018-10-25 NOTE — Progress Notes (Signed)
[place]   DOB:28-Feb-1927   OF#:[id number]   DPO#:[id number]  Subjective:  Sabrina [name] was admitted yesterday with fever and rigors. She had an elevated WBC, cloudy urine, and RUL ASD c/w pneumonia. She is on azithromax and ceftriaxone pending culture results  She is able to give me a good account of her symptoms prior to admission and her home situation. Currently denies SOB, cough or pleurisy. No family in room  Objective: elderly White woman examined in bed Vitals:   10/25/18 0548 10/25/18 0640  BP: (!) 184/54 (!) 160/55  Pulse: (!) 106 96  Resp: 19   Temp: 98.7 F (37.1 C)   SpO2: 96%     There is no height or weight on file to calculate BMI.  Intake/Output Summary (Last 24 hours) at 10/25/2018 0835 Last data filed at 10/25/2018 0550 Gross per 24 hour  Intake 97.87 ml  Output 360 ml  Net -262.13 ml    CBG (last 3)  Recent Labs    10/24/18 1654 10/24/18 2057 10/25/18 0748  GLUCAP 130* 180* 184*     Labs:  Lab Results  Component Value Date   WBC 12.5 (H) 10/25/2018   HGB 10.6 (L) 10/25/2018   HCT 32.9 (L) 10/25/2018   MCV 101.5 (H) 10/25/2018   PLT 200 10/25/2018   NEUTROABS 4.8 10/24/2018    @LASTCHEMISTRY @  Urine Studies No results for input(s): UHGB, CRYS in the last 72 hours.  Invalid input(s): UACOL, UAPR, USPG, UPH, UTP, UGL, UKET, UBIL, UNIT, UROB, ULEU, UEPI, UWBC, URBC, UBAC, CAST, UCOM, BILUA  Basic Metabolic Panel: Recent Labs  Lab 10/24/18 1225 10/25/18 0551  NA 141 145  K 3.9 4.2  CL 99 104  CO2 30 27  GLUCOSE 186* 232*  BUN 97* 83*  CREATININE 2.66* 2.46*  CALCIUM 12.6* 12.2*   GFR CrCl cannot be calculated (Unknown ideal weight.). Liver Function Tests: Recent Labs  Lab 10/24/18 1225  AST 14*  ALT 13  ALKPHOS 96  BILITOT 0.8  PROT 7.1  ALBUMIN 3.8   No results for input(s): LIPASE, AMYLASE in the last 168 hours. No results for input(s): AMMONIA in the last 168 hours. Coagulation profile No results for input(s): INR,  PROTIME in the last 168 hours.  CBC: Recent Labs  Lab 10/24/18 1225 10/25/18 0551  WBC 8.8 12.5*  NEUTROABS 4.8  --   HGB 10.7* 10.6*  HCT 33.2* 32.9*  MCV 101.5* 101.5*  PLT 189 200   Cardiac Enzymes: No results for input(s): CKTOTAL, CKMB, CKMBINDEX, TROPONINI in the last 168 hours. BNP: Invalid input(s): POCBNP CBG: Recent Labs  Lab 10/24/18 1654 10/24/18 2057 10/25/18 0748  GLUCAP 130* 180* 184*   D-Dimer No results for input(s): DDIMER in the last 72 hours. Hgb A1c No results for input(s): HGBA1C in the last 72 hours. Lipid Profile No results for input(s): CHOL, HDL, LDLCALC, TRIG, CHOLHDL, LDLDIRECT in the last 72 hours. Thyroid function studies No results for input(s): TSH, T4TOTAL, T3FREE, THYROIDAB in the last 72 hours.  Invalid input(s): FREET3 Anemia work up No results for input(s): VITAMINB12, FOLATE, FERRITIN, TIBC, IRON, RETICCTPCT in the last 72 hours. Microbiology Recent Results (from the past 240 hour(s))  SARS Coronavirus 2 [facility] order, Performed in [facility] hospital lab) Nasopharyngeal Nasopharyngeal Swab     Status: None   Collection Time: 10/24/18  1:43 PM   Specimen: Nasopharyngeal Swab  Result Value Ref Range Status   SARS Coronavirus 2 NEGATIVE NEGATIVE Final    Comment: (NOTE)  If result is NEGATIVE SARS-CoV-2 target nucleic acids are NOT DETECTED. The SARS-CoV-2 RNA is generally detectable in upper and lower  respiratory specimens during the acute phase of infection. The lowest  concentration of SARS-CoV-2 viral copies this assay can detect is 250  copies / mL. A negative result does not preclude SARS-CoV-2 infection  and should not be used as the sole basis for treatment or other  patient management decisions.  A negative result may occur with  improper specimen collection / handling, submission of specimen other  than nasopharyngeal swab, presence of viral mutation(s) within the  areas targeted by this assay, and inadequate  number of viral copies  (<250 copies / mL). A negative result must be combined with clinical  observations, patient history, and epidemiological information. If result is POSITIVE SARS-CoV-2 target nucleic acids are DETECTED. The SARS-CoV-2 RNA is generally detectable in upper and lower  respiratory specimens dur ing the acute phase of infection.  Positive  results are indicative of active infection with SARS-CoV-2.  Clinical  correlation with patient history and other diagnostic information is  necessary to determine patient infection status.  Positive results do  not rule out bacterial infection or co-infection with other viruses. If result is PRESUMPTIVE POSTIVE SARS-CoV-2 nucleic acids MAY BE PRESENT.   A presumptive positive result was obtained on the submitted specimen  and confirmed on repeat testing.  While 2019 novel coronavirus  (SARS-CoV-2) nucleic acids may be present in the submitted sample  additional confirmatory testing may be necessary for epidemiological  and / or clinical management purposes  to differentiate between  SARS-CoV-2 and other Sarbecovirus currently known to infect humans.  If clinically indicated additional testing with an alternate test  methodology [phone]) is advised. The SARS-CoV-2 RNA is generally  detectable in upper and lower respiratory sp ecimens during the acute  phase of infection. The expected result is Negative. Fact Sheet for Patients:  StrictlyIdeas.no Fact Sheet for Healthcare Providers: BankingDealers.co.za This test is not yet approved or cleared by the [place] FDA and has been authorized for detection and/or diagnosis of SARS-CoV-2 by FDA under an Emergency Use Authorization (EUA).  This EUA will remain in effect (meaning this test can be used) for the duration of the COVID-19 declaration under Section 564(b)(1) of the Act, 21 U.S.C. section 360bbb-3(b)(1), unless the  authorization is terminated or revoked sooner. Performed at [facility], [place] [address], [place], [place] [address]   Blood culture (routine x 2)     Status: None (Preliminary result)   Collection Time: 10/24/18  6:52 PM   Specimen: BLOOD  Result Value Ref Range Status   Specimen Description   Final    BLOOD BLOOD LEFT HAND Performed at [facility] [address] [place], [place] [address]    Special Requests   Final    BOTTLES DRAWN AEROBIC ONLY Blood Culture adequate volume Performed at [facility] [address] [place], [place] [address]    Culture   Final    NO GROWTH < 12 HOURS Performed at [facility] [address] [place], [place] [address]    Report Status PENDING  Incomplete  Blood culture (routine x 2)     Status: None (Preliminary result)   Collection Time: 10/24/18  6:52 PM   Specimen: BLOOD  Result Value Ref Range Status   Specimen Description   Final    BLOOD LEFT ANTECUBITAL Performed at [facility], [address] [place], [place] [address]    Special Requests   Final    BOTTLES DRAWN AEROBIC ONLY Blood Culture adequate volume Performed at [facility] [address], [place], [place] [address]    Culture   Final    NO GROWTH < 12 HOURS Performed at [facility] [address] [place], [place] [address]    Report Status PENDING  Incomplete      Studies:  Ct Abdomen Pelvis Wo Contrast  Result Date: 10/24/2018 CLINICAL DATA:  Abdominal pain. Left upper quadrant and flank tenderness. UTI. EXAM: CT ABDOMEN AND PELVIS WITHOUT CONTRAST TECHNIQUE: Multidetector CT imaging of the abdomen and pelvis was performed following the standard protocol without IV contrast. COMPARISON:  None. FINDINGS: Lower chest: 4 mm right lower lobe pulmonary nodule stable since chest CT of 08/20/2015, consistent with benign etiology such as scarring. Mitral  annular calcification noted. Hepatobiliary: No focal abnormality in the liver on this study without intravenous contrast. Calcified gallstones noted. No intrahepatic or extrahepatic biliary dilation. Pancreas: No focal mass lesion. No dilatation of the main duct. No intraparenchymal cyst. No peripancreatic edema. Spleen: No splenomegaly. No focal mass lesion. Adrenals/Urinary Tract: No adrenal nodule or mass. No renal stones or hydronephrosis in either kidney. No gross renal mass on this noncontrast study. No evidence for hydroureter. The urinary bladder appears normal for the degree of distention. Stomach/Bowel: Tiny hiatal hernia. Stomach otherwise unremarkable. Duodenum is normally positioned as is the ligament of Treitz. No small bowel wall thickening. No small bowel dilatation. The terminal ileum is normal. The appendix is not visualized, but there is no edema or inflammation in the region of the cecum. No gross colonic mass. No colonic wall thickening. Diverticular changes are noted in the left colon without evidence of diverticulitis. Vascular/Lymphatic: There is abdominal aortic atherosclerosis without aneurysm. There is no gastrohepatic or hepatoduodenal ligament lymphadenopathy. No intraperitoneal or retroperitoneal lymphadenopathy. No pelvic sidewall lymphadenopathy. Reproductive: Unremarkable. Other: No intraperitoneal free fluid. Musculoskeletal: No worrisome lytic or sclerotic osseous abnormality. Small paraumbilical midline hernia contains small bowel without complicating features. Hernia sac measures approximately 6.2 x 3.3 x 7.2 cm. IMPRESSION: 1. No acute findings in the abdomen or pelvis. No findings to explain the patient's history of left upper quadrant flank tenderness. 2. Paraumbilical midline ventral hernia contains small bowel without complicating features. 3. Tiny hiatal hernia. 4.  Aortic Atherosclerois (ICD10-170.0) Electronically Signed   By: Misty Stanley M.D.   On: 10/24/2018 16:28    Dg Chest 2 View  Result Date: 10/24/2018 CLINICAL DATA:  Weakness and shaking for 2 days. EXAM: CHEST - 2 VIEW COMPARISON:  08/15/2017 FINDINGS: Nodular opacity in the right apex is new in the interval. Patchy airspace opacity is seen in the peripheral right upper lobe. Left lung clear. No pleural effusion. Cardiopericardial silhouette is at upper limits of normal for size. The visualized bony structures of the thorax are intact. IMPRESSION: Patchy airspace disease right upper lobe suspicious for pneumonia. Nodular density in the right apex is new in the interval since prior study. CT chest without contrast recommended to further evaluate. Electronically Signed   By: Misty Stanley M.D.   On: 10/24/2018 12:34    Assessment: 85 y.o.  [place] woman status post right lumpectomy and axillary lymph node sampling 03/30/2013 for a pT1c pN1a, stage IIA invasive ductal carcinoma, grade 3, estrogen receptor 100% positive, progesterone receptor 100% positive, with an MIB-1 of 15%, and HER-2 amplified, with a signals ratio of  2.54 and the number per cell being 3.55.             (a) margins were positive but cleared with additional surgery February 2015 ([id number])  (1) adjuvant chemotherapy /immunotherapy with paclitaxel and trastuzumab weekly started 05/10/2013, discontinued after 2 doses because of poor tolerance  (2) ventilation/perfusion scan 06/10/2013 documented right lower lobe pulmonary emboli; Doppler ultrasound 06/11/2013 documented bilateral lower extremity DVTs; started on warfarin managed through Dr. Carlyle Lipa office  (3) anastrozole started June 2015, completing 5 years June 2020  (4) adjuvant radiation completed 10/20/2013  (5) Trastuzumab resumed 09/07/2013, continued through 06/07/2014;  final echocardiogram on 03/30/2016showed a well preserved ejection fraction  (6) genetics testing April 2015 was normal and did not reveal a mutation in any of these genes: APC, ATM, AXIN2,  BARD1, BMPRIA, BRCA1, BRCA2, BRIP1, CDH1, CDK4, CDKN2A, CHEK2, EPCAM, FANCC, MLH1, MSH2, MSH6, MUTYH, NBN, PALB2, PMS2, PTEN, RAD51C, SMAD4, STK11, TP53, VHL, and XRCC2  (7) osteopenia: DEXA scan 08/17/2013 showed a T score of -2.1--             (a) started Prolia (denosumab) 01/11/2014, repeated every 6 months             (b) bone density 08/21/2015 showed a T score of -2.2 osteopenia  (8)  Plan:  Kelsea was admitted 10/24/2018 with fever and rigors, possible pyelo, possible RUL pneumonia. She is now more than 5 years out from definitive surgery for her breast cancer, with no evidence of disease recurrence. She is very likely cured.  She lives independently with her husband Sabrina [name], who has significant dementia. They have 24-hr caregivers. That will facilitate discharge planning.  She tells me she thinks she has advanced directives. I do not have those documents.  She is high fall risk and likely will benefit from home PT at time of d/c.  Will follow peripherally. Please let me know if I can be of help.   Sabrina Cruel, MD 10/25/2018  8:35 AM Medical Oncology and Hematology [facility] [address] [place], [place] [address] Tel. [phone]    Fax. [phone]

## 2018-10-25 NOTE — Evaluation (Signed)
Physical Therapy Evaluation Patient Details Name: Sabrina [name] MRN: [id number] DOB: 12-01-1926 Today's Date: 10/25/2018   History of Present Illness  85 y.o. female with past medical history of hypertension, hyperlipidemia, T2DM, stroke, DVT/PE, CKD3, right breast cancer, recent L clavicle fx admitted through ED 10/24/18  from home with generalized weakness and chills-->UTI  Clinical Impression  Pt admitted with above diagnosis.  Pt in bed on arrival with purewick in place. Pt agreeable to get OOB. Pt with depends/diaper on in addition to  purewick and bed soaked in urine  through 2 pads, fitted sheet and mattress for greater than 50% of bed area. Pt assisted to [facility] and with hygiene including barrier cream as pt stated her skin felt "raw". Pt left in chair on dry pads, no chair alarm available. Will benefit from HHPT, has 24 hour caregivers. Will follow in acute setting   Pt currently with functional limitations due to the deficits listed below (see PT Problem List). Pt will benefit from skilled PT to increase their independence and safety with mobility to allow discharge to the venue listed below.       Follow Up Recommendations Home health PT;Supervision/Assistance - 24 hour    Equipment Recommendations  None recommended by PT    Recommendations for Other Services       Precautions / Restrictions Precautions Precautions: Fall Restrictions Weight Bearing Restrictions: No      Mobility  Bed Mobility Overal bed mobility: Needs Assistance Bed Mobility: Supine to Sit     Supine to sit: Mod assist     General bed mobility comments: assist with LEs and trunk, incr time and effort  Transfers Overall transfer level: Needs assistance Equipment used: Rolling walker (2 wheeled) Transfers: Sit to/from [facility] Sit to Stand: Mod assist Stand pivot transfers: Mod assist       General transfer comment: assist for anterior-suuperior wt translation. cues for  safety and hand placement. stand pivot x2--bed <> BSC<>chair  Ambulation/Gait             General Gait Details: unable d/t fatigue and LE weakness  Stairs            Wheelchair Mobility    Modified Rankin (Stroke Patients Only)       Balance Overall balance assessment: Needs assistance Sitting-balance support: Feet supported;Single extremity supported Sitting balance-Leahy Scale: Fair       Standing balance-Leahy Scale: Poor Standing balance comment: reliant on UEs and external assist                             Pertinent Vitals/Pain Pain Assessment: Faces Faces Pain Scale: Hurts a little bit Pain Location: generalized with movement Pain Descriptors / Indicators: Grimacing;Discomfort Pain Intervention(s): Repositioned;Monitored during session    [place] expects to be discharged to:: Private residence Living Arrangements: Spouse/significant other Available Help at Discharge: Personal care attendant;Available 24 hours/day Type of Home: House Home Access: Stairs to enter Entrance Stairs-Rails: None Entrance Stairs-Number of Steps: 7 in front; 2 in back Home Layout: One level Home Equipment: Walker - 2 wheels;Shower seat;Grab bars - tub/shower Additional Comments: caregivers for pt and spouse (spouse has dementia) 24/7    Prior Function Level of Independence: Needs assistance   Gait / Transfers Assistance Needed: uses RW for functional mobility  ADL's / Homemaking Assistance Needed: caregiver assist with bathing, dressing, home maintaining and cooking        Hand Dominance  Dominant Hand: Right    Extremity/Trunk Assessment   Upper Extremity Assessment Upper Extremity Assessment: Generalized weakness;LUE deficits/detail LUE Deficits / Details: shoulder flexion limited to 95 degrees (recent clavicle fx ~ 1 mo ago)    Lower Extremity Assessment Lower Extremity Assessment: Generalized weakness       Communication    Communication: No difficulties  Cognition Arousal/Alertness: Awake/alert Behavior During Therapy: WFL for tasks assessed/performed Overall Cognitive Status: Within Functional Limits for tasks assessed                                        General Comments      Exercises     Assessment/Plan    PT Assessment Patient needs continued PT services  PT Problem List Decreased strength;Decreased mobility;Decreased activity tolerance;Decreased knowledge of use of DME;Decreased balance       PT Treatment Interventions DME instruction;Gait training;Functional mobility training;Therapeutic activities;Patient/family education;Therapeutic exercise    PT Goals (Current goals can be found in the Care Plan section)  Acute Rehab PT Goals Patient Stated Goal: get stonger PT Goal Formulation: With patient Time For Goal Achievement: 11/08/18 Potential to Achieve Goals: Good    Frequency Min 3X/week   Barriers to discharge        Co-evaluation               AM-PAC PT "6 Clicks" Mobility  Outcome Measure Help needed turning from your back to your side while in a flat bed without using bedrails?: A Lot Help needed moving from lying on your back to sitting on the side of a flat bed without using bedrails?: A Lot Help needed moving to and from a bed to a chair (including a wheelchair)?: A Lot Help needed standing up from a chair using your arms (e.g., wheelchair or bedside chair)?: A Lot Help needed to walk in hospital room?: A Lot Help needed climbing 3-5 steps with a railing? : A Lot 6 Click Score: 12    End of Session Equipment Utilized During Treatment: Gait belt Activity Tolerance: Patient tolerated treatment well Patient left: in chair;with call bell/phone within reach(no alarms on unit) Nurse Communication: Mobility status PT Visit Diagnosis: Difficulty in walking, not elsewhere classified (R26.2);Muscle weakness (generalized) (M62.81)    Time:  4327-6147 PT Time Calculation (min) (ACUTE ONLY): 38 min   Charges:   PT Evaluation $PT Eval Low Complexity: 1 Low PT Treatments $Therapeutic Activity: 23-37 mins        Kenyon Ana, PT  Pager: [phone] Acute Rehab Dept [facility]): [phone]   10/25/2018   [facility] 10/25/2018, 4:41 PM

## 2018-10-25 NOTE — Progress Notes (Signed)
PROGRESS NOTE  Sabrina [name] YYT:[id number] DOB: 29-Aug-1926 DOA: 10/24/2018 PCP: [name], MD  Brief summary: Sabrina [name] is a 85 y.o. female with past medical history of hypertension, hyperlipidemia, T2DM, stroke, DVT/PE, CKD3, right breast cancer who lives at home with her husband.  She is able to ambulate with a walker. Patient was brought in the ED today from home with generalized weakness and chills for last 2 days with burning urination.  Home health care nurse also reported altered mental status to EMS.  In the ED, patient had a temperature 100.6, blood pressure 168/76, heart rate 82, breathing comfortably on room air. Labs showed WBC count 8.8, hemoglobin 10.7, platelet 189, sodium 141, potassium 3.9, serum bicarb 30, BUN/creatinine 97/2.66, glucose level 186. COVID-19 test negative Urinalysis showed cloudy yellow urine with large amount of leukocytes, many bacteria and more than 50 WBCs Hospital service was consulted for inpatient admission and management.  At the time of my evaluation, patient was alert, awake, oriented x3 but slow to respond.  She does not believe that she was confused prior to presentation.  She lives at her house with her husband.  She is able to ambulate with a walker. Her sister is the power of attorney.  Patient is a full code.   HPI/Recap of past 24 hours:  She c/o feeling confused, she reports seeing bunch of kids in her room early this am She remembered her oncologist Dr Jana Hakim came it this am,   Currently she denies pain  + puriwick with cloudy urine  Assessment/Plan: Active Problems:   UTI (urinary tract infection)   UTI /left flank and left upper quadrant tenderness --Urine culture in process, blood culture no growth,  -CT scan of abdomen/pel no acute findings -Started on IV Rocephin in the ED.  Continue.  Acute toxic metabolic encephalopathy -Related to UTI and AKI. -Seems to be resolving.  Continue to  monitor.  AKI on CKD stage III -Creatinine 1.7 at baseline, elevated to 2.66 at presentation. -improving, continue ivf, renal dosing meds.   Cardiovascular issues : hypertension, hyperlipidemia - Prior to initial, patient was on clonidine, Lasix, hydralazine.  - Resume clonidine at a lower dose.  Keep hydralazine and Lasix on hold.  PRN hydralazine ordered.  Diabetes mellitus with peripheral neuropathy -Not on insulin.  Keep glipizide on hold because of AKI.  Keep Neurontin on hold until mental status clears.  History of DVT/ PE -On Eliquis.  Right breast cancer ( ER+, PR+, Her2+)s/p lumpectomy/adjuvenat chemo/immunotherapy, finished 90yranastrozole in 2020 -in remission, no edivence of disease recurrent, she is "very likely cured" per oncology Dr MJana Hakimon 8/23. -On denosumab for osteopenia  DVT Prophylaxis: on eliquis  Code Status: full  Family Communication: patient  Disposition Plan: likely home with home health in 1-2 days, She has  No children , Lives at home with husband who has dementia, she reports has 24/7 care at home (one person at a time), she walks with a walker at baseline   Consultants:  none  Procedures:  none  Antibiotics:  rocephin   Objective: BP (!) 160/55    Pulse 96    Temp 98.7 F (37.1 C) (Oral)    Resp 19    SpO2 96%   Intake/Output Summary (Last 24 hours) at 10/25/2018 0922 Last data filed at 10/25/2018 0851 Gross per 24 hour  Intake 945.47 ml  Output 360 ml  Net 585.47 ml   There were no vitals filed for this visit.  Exam: Patient is examined daily including today on 10/25/2018, exams remain the same as of yesterday except that has changed    General:   aaox3, but confused, keep repeating herself,   Cardiovascular: RRR  Respiratory: CTABL  Abdomen: small reducible umbilical hernia, Soft/ND/NT, positive BS  Musculoskeletal: No Edema  Neuro:  oriented x3 but confused  Data Reviewed: Basic Metabolic Panel: Recent  Labs  Lab 10/24/18 1225 10/25/18 0551  NA 141 145  K 3.9 4.2  CL 99 104  CO2 30 27  GLUCOSE 186* 232*  BUN 97* 83*  CREATININE 2.66* 2.46*  CALCIUM 12.6* 12.2*   Liver Function Tests: Recent Labs  Lab 10/24/18 1225  AST 14*  ALT 13  ALKPHOS 96  BILITOT 0.8  PROT 7.1  ALBUMIN 3.8   No results for input(s): LIPASE, AMYLASE in the last 168 hours. No results for input(s): AMMONIA in the last 168 hours. CBC: Recent Labs  Lab 10/24/18 1225 10/25/18 0551  WBC 8.8 12.5*  NEUTROABS 4.8  --   HGB 10.7* 10.6*  HCT 33.2* 32.9*  MCV 101.5* 101.5*  PLT 189 200   Cardiac Enzymes:   No results for input(s): CKTOTAL, CKMB, CKMBINDEX, TROPONINI in the last 168 hours. BNP (last 3 results) No results for input(s): BNP in the last 8760 hours.  ProBNP (last 3 results) No results for input(s): PROBNP in the last 8760 hours.  CBG: Recent Labs  Lab 10/24/18 1654 10/24/18 2057 10/25/18 0748  GLUCAP 130* 180* 184*    Recent Results (from the past 240 hour(s))  SARS Coronavirus 2 [facility] order, Performed in [facility] hospital lab) Nasopharyngeal Nasopharyngeal Swab     Status: None   Collection Time: 10/24/18  1:43 PM   Specimen: Nasopharyngeal Swab  Result Value Ref Range Status   SARS Coronavirus 2 NEGATIVE NEGATIVE Final    Comment: (NOTE) If result is NEGATIVE SARS-CoV-2 target nucleic acids are NOT DETECTED. The SARS-CoV-2 RNA is generally detectable in upper and lower  respiratory specimens during the acute phase of infection. The lowest  concentration of SARS-CoV-2 viral copies this assay can detect is 250  copies / mL. A negative result does not preclude SARS-CoV-2 infection  and should not be used as the sole basis for treatment or other  patient management decisions.  A negative result may occur with  improper specimen collection / handling, submission of specimen other  than nasopharyngeal swab, presence of viral mutation(s) within the  areas targeted by  this assay, and inadequate number of viral copies  (<250 copies / mL). A negative result must be combined with clinical  observations, patient history, and epidemiological information. If result is POSITIVE SARS-CoV-2 target nucleic acids are DETECTED. The SARS-CoV-2 RNA is generally detectable in upper and lower  respiratory specimens dur ing the acute phase of infection.  Positive  results are indicative of active infection with SARS-CoV-2.  Clinical  correlation with patient history and other diagnostic information is  necessary to determine patient infection status.  Positive results do  not rule out bacterial infection or co-infection with other viruses. If result is PRESUMPTIVE POSTIVE SARS-CoV-2 nucleic acids MAY BE PRESENT.   A presumptive positive result was obtained on the submitted specimen  and confirmed on repeat testing.  While 2019 novel coronavirus  (SARS-CoV-2) nucleic acids may be present in the submitted sample  additional confirmatory testing may be necessary for epidemiological  and / or clinical management purposes  to differentiate between  SARS-CoV-2 and other Sarbecovirus  currently known to infect humans.  If clinically indicated additional testing with an alternate test  methodology [phone]) is advised. The SARS-CoV-2 RNA is generally  detectable in upper and lower respiratory sp ecimens during the acute  phase of infection. The expected result is Negative. Fact Sheet for Patients:  StrictlyIdeas.no Fact Sheet for Healthcare Providers: BankingDealers.co.za This test is not yet approved or cleared by the [place] FDA and has been authorized for detection and/or diagnosis of SARS-CoV-2 by FDA under an Emergency Use Authorization (EUA).  This EUA will remain in effect (meaning this test can be used) for the duration of the COVID-19 declaration under Section 564(b)(1) of the Act, 21 U.S.C. section  360bbb-3(b)(1), unless the authorization is terminated or revoked sooner. Performed at [facility], [place] [name], [place], [place] [address]   Blood culture (routine x 2)     Status: None (Preliminary result)   Collection Time: 10/24/18  6:52 PM   Specimen: BLOOD  Result Value Ref Range Status   Specimen Description   Final    BLOOD BLOOD LEFT HAND Performed at [place] [address] [place], [place] [address]    Special Requests   Final    BOTTLES DRAWN AEROBIC ONLY Blood Culture adequate volume Performed at [place] [address] [place], [place] [address]    Culture   Final    NO GROWTH < 12 HOURS Performed at [place] [address], [place], [place] [address]    Report Status PENDING  Incomplete  Blood culture (routine x 2)     Status: None (Preliminary result)   Collection Time: 10/24/18  6:52 PM   Specimen: BLOOD  Result Value Ref Range Status   Specimen Description   Final    BLOOD LEFT ANTECUBITAL Performed at [place] [address] [place], [place] [address]    Special Requests   Final    BOTTLES DRAWN AEROBIC ONLY Blood Culture adequate volume Performed at [place] [address] [place], [place] [address]    Culture   Final    NO GROWTH < 12 HOURS Performed at [place] [address] [place], [place] [address]    Report Status PENDING  Incomplete     Studies: Ct Abdomen Pelvis Wo Contrast  Result Date: 10/24/2018 CLINICAL DATA:  Abdominal pain. Left upper quadrant and flank tenderness. UTI. EXAM: CT ABDOMEN AND PELVIS WITHOUT CONTRAST TECHNIQUE: Multidetector CT imaging of the abdomen and pelvis was performed following the standard protocol without IV contrast. COMPARISON:  None. FINDINGS: Lower chest: 4 mm right lower lobe pulmonary nodule stable since chest CT of 08/20/2015, consistent with benign etiology such as  scarring. Mitral annular calcification noted. Hepatobiliary: No focal abnormality in the liver on this study without intravenous contrast. Calcified gallstones noted. No intrahepatic or extrahepatic biliary dilation. Pancreas: No focal mass lesion. No dilatation of the main duct. No intraparenchymal cyst. No peripancreatic edema. Spleen: No splenomegaly. No focal mass lesion. Adrenals/Urinary Tract: No adrenal nodule or mass. No renal stones or hydronephrosis in either kidney. No gross renal mass on this noncontrast study. No evidence for hydroureter. The urinary bladder appears normal for the degree of distention. Stomach/Bowel: Tiny hiatal hernia. Stomach otherwise unremarkable. Duodenum is normally positioned as is the ligament of Treitz. No small bowel wall thickening. No small bowel dilatation. The terminal ileum is normal. The appendix is not visualized, but there is no  edema or inflammation in the region of the cecum. No gross colonic mass. No colonic wall thickening. Diverticular changes are noted in the left colon without evidence of diverticulitis. Vascular/Lymphatic: There is abdominal aortic atherosclerosis without aneurysm. There is no gastrohepatic or hepatoduodenal ligament lymphadenopathy. No intraperitoneal or retroperitoneal lymphadenopathy. No pelvic sidewall lymphadenopathy. Reproductive: Unremarkable. Other: No intraperitoneal free fluid. Musculoskeletal: No worrisome lytic or sclerotic osseous abnormality. Small paraumbilical midline hernia contains small bowel without complicating features. Hernia sac measures approximately 6.2 x 3.3 x 7.2 cm. IMPRESSION: 1. No acute findings in the abdomen or pelvis. No findings to explain the patient's history of left upper quadrant flank tenderness. 2. Paraumbilical midline ventral hernia contains small bowel without complicating features. 3. Tiny hiatal hernia. 4.  Aortic Atherosclerois (ICD10-170.0) Electronically Signed   By: Misty Stanley M.D.   On:  10/24/2018 16:28   Dg Chest 2 View  Result Date: 10/24/2018 CLINICAL DATA:  Weakness and shaking for 2 days. EXAM: CHEST - 2 VIEW COMPARISON:  08/15/2017 FINDINGS: Nodular opacity in the right apex is new in the interval. Patchy airspace opacity is seen in the peripheral right upper lobe. Left lung clear. No pleural effusion. Cardiopericardial silhouette is at upper limits of normal for size. The visualized bony structures of the thorax are intact. IMPRESSION: Patchy airspace disease right upper lobe suspicious for pneumonia. Nodular density in the right apex is new in the interval since prior study. CT chest without contrast recommended to further evaluate. Electronically Signed   By: Misty Stanley M.D.   On: 10/24/2018 12:34    Scheduled Meds:  allopurinol  100 mg Oral Daily   apixaban  2.5 mg Oral BID   cholecalciferol  1,000 Units Oral Daily   cloNIDine  0.2 mg Oral BID   insulin aspart  0-5 Units Subcutaneous QHS   insulin aspart  0-9 Units Subcutaneous TID WC   polyethylene glycol  17 g Oral Daily   senna  1 tablet Oral BID   sodium chloride flush  3 mL Intravenous Q12H    Continuous Infusions:  sodium chloride 75 mL/hr at 10/25/18 8242   cefTRIAXone (ROCEPHIN)  IV       Time spent: 48mns I have personally reviewed and interpreted on  10/25/2018 daily labs, imagings as discussed above under date review session and assessment and plans.  I reviewed all nursing notes, pharmacy notes, consultant notes,  vitals, pertinent old records  I have discussed plan of care as described above with RN , patient  on 10/25/2018   FFlorencia ReasonsMD, PhD, FACP  [facility] Pager [phone] If 7PM-7AM, please contact night-coverage at [email or web address], password [facility]8/23/2020, 9:22 AM  LOS: 1 day

## 2018-10-26 DIAGNOSIS — E1165 Type 2 diabetes mellitus with hyperglycemia: Secondary | ICD-10-CM

## 2018-10-26 DIAGNOSIS — I5032 Chronic diastolic (congestive) heart failure: Secondary | ICD-10-CM

## 2018-10-26 DIAGNOSIS — Z86718 Personal history of other venous thrombosis and embolism: Secondary | ICD-10-CM | POA: Insufficient documentation

## 2018-10-26 DIAGNOSIS — N184 Chronic kidney disease, stage 4 (severe): Secondary | ICD-10-CM

## 2018-10-26 DIAGNOSIS — G9341 Metabolic encephalopathy: Secondary | ICD-10-CM | POA: Diagnosis present

## 2018-10-26 LAB — BASIC METABOLIC PANEL
Anion gap: 8 (ref 5–15)
BUN: 70 mg/dL — ABNORMAL HIGH (ref 8–23)
CO2: 27 mmol/L (ref 22–32)
Calcium: 11.7 mg/dL — ABNORMAL HIGH (ref 8.9–10.3)
Chloride: 107 mmol/L (ref 98–111)
Creatinine, Ser: 2.22 mg/dL — ABNORMAL HIGH (ref 0.44–1.00)
GFR calc Af Amer: 22 mL/min — ABNORMAL LOW (ref >60)
GFR calc non Af Amer: 19 mL/min — ABNORMAL LOW (ref >60)
Glucose, Bld: 179 mg/dL — ABNORMAL HIGH (ref 70–99)
Potassium: 3.9 mmol/L (ref 3.5–5.1)
Sodium: 142 mmol/L (ref 135–145)

## 2018-10-26 LAB — GLUCOSE, CAPILLARY
Glucose-Capillary: 165 mg/dL — ABNORMAL HIGH (ref 70–99)
Glucose-Capillary: 180 mg/dL — ABNORMAL HIGH (ref 70–99)
Glucose-Capillary: 160 mg/dL — ABNORMAL HIGH (ref 70–99)
Glucose-Capillary: 198 mg/dL — ABNORMAL HIGH (ref 70–99)

## 2018-10-26 LAB — CBC
HCT: 28 % — ABNORMAL LOW (ref 36.0–46.0)
Hemoglobin: 8.7 g/dL — ABNORMAL LOW (ref 12.0–15.0)
MCH: 32.5 pg (ref 26.0–34.0)
MCHC: 31.1 g/dL (ref 30.0–36.0)
MCV: 104.5 fL — ABNORMAL HIGH (ref 80.0–100.0)
Platelets: 146 10*3/uL — ABNORMAL LOW (ref 150–400)
RBC: 2.68 MIL/uL — ABNORMAL LOW (ref 3.87–5.11)
RDW: 14.4 % (ref 11.5–15.5)
WBC: 7.5 10*3/uL (ref 4.0–10.5)
nRBC: 0 % (ref 0.0–0.2)

## 2018-10-26 MED ORDER — VANCOMYCIN HCL 500 MG IV SOLR: 500.0000 mg | INTRAVENOUS | Status: DC

## 2018-10-26 MED ORDER — ADULT MULTIVITAMIN W/MINERALS CH
1.0000 | ORAL_TABLET | Freq: Every day | ORAL | Status: DC
  Administered 2018-10-26 – 2018-10-27 (×2): 1 via ORAL
  Filled 2018-10-26 (×2): qty 1

## 2018-10-26 MED ORDER — INSULIN ASPART 100 UNIT/ML ~~LOC~~ SOLN: 0.0000 [IU] | Freq: Every day | SUBCUTANEOUS | Status: DC

## 2018-10-26 MED ORDER — INSULIN ASPART 100 UNIT/ML ~~LOC~~ SOLN
0.0000 [IU] | Freq: Three times a day (TID) | SUBCUTANEOUS | Status: DC
  Administered 2018-10-27 – 2018-10-28 (×5): 4 [IU] via SUBCUTANEOUS

## 2018-10-26 MED ORDER — VANCOMYCIN HCL 10 G IV SOLR
1500.0000 mg | Freq: Once | INTRAVENOUS | Status: AC
  Administered 2018-10-26: 1500 mg via INTRAVENOUS
  Filled 2018-10-26: qty 1500

## 2018-10-26 MED ORDER — ENSURE ENLIVE PO LIQD
237.0000 mL | ORAL | Status: DC
  Administered 2018-10-28: 237 mL via ORAL

## 2018-10-26 NOTE — Progress Notes (Signed)
PROGRESS NOTE  Sabrina [name] MEQ:[id number] DOB: 14-Feb-1927 DOA: 10/24/2018 PCP: [name], MD  Brief summary: Sabrina [name] is a 85 y.o. female with past medical history of hypertension, hyperlipidemia, T2DM, stroke, DVT/PE, CKD3, right breast cancer who lives at home with her husband.  She is able to ambulate with a walker. Patient was brought in the ED today from home with generalized weakness and chills for last 2 days with burning urination.  Home health care nurse also reported altered mental status to EMS.  In the ED, patient had a temperature 100.6, blood pressure 168/76, heart rate 82, breathing comfortably on room air. Labs showed WBC count 8.8, hemoglobin 10.7, platelet 189, sodium 141, potassium 3.9, serum bicarb 30, BUN/creatinine 97/2.66, glucose level 186. COVID-19 test negative Urinalysis showed cloudy yellow urine with large amount of leukocytes, many bacteria and more than 50 WBCs Hospital service was consulted for inpatient admission and management.  At the time of my evaluation, patient was alert, awake, oriented x3 but slow to respond.  She does not believe that she was confused prior to presentation.  She lives at her house with her husband.  She is able to ambulate with a walker. Her sister is the power of attorney.  Patient is a full code.   HPI/Recap of past 24 hours:  Patient is feeding herself dinner, in good spirits, no c/o today No family here. Pt disappointed about her reduced mobility  Assessment/Plan: Principal Problem:   UTI (urinary tract infection) Active Problems:   Acute metabolic encephalopathy   UTI /left flank and left upper quadrant tenderness --Urine culture + 40K Enterococcus/ Faecium, blood cx's negative -CT scan of abdomen/pel no acute findings - will dc IV Rocephin and start IV Vanc, f/u sensitivities when available  Acute toxic metabolic encephalopathy -Related to UTI and AKI. -Resolving, mental status sig  improved today per staff  AKI on CKD stage III -Creatinine 1.7 at baseline, elevated to 2.66 > 2.4 > 2.2 today, imrpoving -improving, continue ivf, renal dosing meds.   Cardiovascular issues : hypertension, hyperlipidemia - Prior to initial, patient was on clonidine, Lasix, hydralazine.  - Resume clonidine at a lower dose.  Keep hydralazine and Lasix on hold.  PRN hydralazine ordered - BP's are low-normal, cont low dose clonidine, holding other meds  Diabetes mellitus with peripheral neuropathy -Not on insulin.  Keep glipizide on hold because of AKI.  Keep Neurontin on hold until mental status clears - will ^ stre\ngth of SSI  BS's are 150- 180 range  History of DVT/ PE -On Eliquis.  Right breast cancer ( ER+, PR+, Her2+)s/p lumpectomy/adjuvenat chemo/immunotherapy, finished 19yranastrozole in 2020 -in remission, no edivence of disease recurrent, she is "very likely cured" per oncology Dr MJana Hakimon 8/23. -On denosumab for osteopenia  Debility - PT has seen and [facility], 24hr supervisoin/ assistance  DVT Prophylaxis: on eliquis Code Status: full Family Communication: patient Disposition Plan: likely home with home health in 1-2 days, She has  No children , Lives at home with husband who has dementia, she reports has 24/7 care at home (one person at a time), she walks with a walker at baseline  RKelly SplinterMD  [facility] [phone]/24/2020, 6:05 PM       Consultants:  none  Procedures:  none  Antibiotics:  rocephin   Objective: BP 124/63 (BP Location: Right Arm)   Pulse (!) 50   Temp 98.3 F (36.8 C) (Oral)  Resp 17   Ht _0  (1.499 m)   Wt 78.3 kg   SpO2 100%   BMI 34.87 kg/m   Intake/Output Summary (Last 24 hours) at 10/26/2018 1754 Last data filed at 10/26/2018 1300 Gross per 24 hour  Intake 2024.57 ml  Output 400 ml  Net 1624.57 ml   Filed Weights   10/26/18 1656  Weight: 78.3 kg    Exam: Patient is examined  daily including today on 10/26/2018, exams remain the same as of yesterday except that has changed    General:   aaox3, but confused, keep repeating herself,   Cardiovascular: RRR  Respiratory: CTABL  Abdomen: small reducible umbilical hernia, Soft/ND/NT, positive BS  Musculoskeletal: No Edema  Neuro:  oriented x3 but confused  Data Reviewed: Basic Metabolic Panel: Recent Labs  Lab 10/24/18 1225 10/25/18 0551 10/26/18 0800  NA 141 145 142  K 3.9 4.2 3.9  CL 99 104 107  CO2 _1 GLUCOSE 186* 232* 179*  BUN 97* 83* 70*  CREATININE 2.66* 2.46* 2.22*  CALCIUM 12.6* 12.2* 11.7*   Liver Function Tests: Recent Labs  Lab 10/24/18 1225  AST 14*  ALT 13  ALKPHOS 96  BILITOT 0.8  PROT 7.1  ALBUMIN 3.8   No results for input(s): LIPASE, AMYLASE in the last 168 hours. No results for input(s): AMMONIA in the last 168 hours. CBC: Recent Labs  Lab 10/24/18 1225 10/25/18 0551 10/26/18 0800  WBC 8.8 12.5* 7.5  NEUTROABS 4.8  --   --   HGB 10.7* 10.6* 8.7*  HCT 33.2* 32.9* 28.0*  MCV 101.5* 101.5* 104.5*  PLT 189 200 146*   Cardiac Enzymes:   No results for input(s): CKTOTAL, CKMB, CKMBINDEX, TROPONINI in the last 168 hours. BNP (last 3 results) No results for input(s): BNP in the last 8760 hours.  ProBNP (last 3 results) No results for input(s): PROBNP in the last 8760 hours.  CBG: Recent Labs  Lab 10/25/18 1728 10/25/18 2047 10/26/18 0747 10/26/18 1145 10/26/18 1649  GLUCAP 148* 139* 165* 180* 160*    Recent Results (from the past 240 hour(s))  Urine culture     Status: Abnormal (Preliminary result)   Collection Time: 10/24/18 12:32 PM   Specimen: Urine, Clean Catch  Result Value Ref Range Status   Specimen Description   Final    URINE, CLEAN CATCH Performed at [facility], [place] [address] [place], [place] [address]    Special Requests   Final    Normal Performed at [facility], [place] [address]  [place], [place] [address]    Culture (A)  Final    40,000 COLONIES/mL ENTEROCOCCUS FAECIUM SUSCEPTIBILITIES TO FOLLOW Performed at [facility], [place] [address] [place], [place] [address]    Report Status PENDING  Incomplete  SARS Coronavirus 2 [facility] order, Performed in [facility] hospital lab) Nasopharyngeal Nasopharyngeal Swab     Status: None   Collection Time: 10/24/18  1:43 PM   Specimen: Nasopharyngeal Swab  Result Value Ref Range Status   SARS Coronavirus 2 NEGATIVE NEGATIVE Final    Comment: (NOTE) If result is NEGATIVE SARS-CoV-2 target nucleic acids are NOT DETECTED. The SARS-CoV-2 RNA is generally detectable in upper and lower  respiratory specimens during the acute phase of infection. The lowest  concentration of SARS-CoV-2 viral copies this assay can detect is 250  copies / mL. A negative result does not preclude SARS-CoV-2 infection  and should not be used as  the sole basis for treatment or other  patient management decisions.  A negative result may occur with  improper specimen collection / handling, submission of specimen other  than nasopharyngeal swab, presence of viral mutation(s) within the  areas targeted by this assay, and inadequate number of viral copies  (<250 copies / mL). A negative result must be combined with clinical  observations, patient history, and epidemiological information. If result is POSITIVE SARS-CoV-2 target nucleic acids are DETECTED. The SARS-CoV-2 RNA is generally detectable in upper and lower  respiratory specimens dur ing the acute phase of infection.  Positive  results are indicative of active infection with SARS-CoV-2.  Clinical  correlation with patient history and other diagnostic information is  necessary to determine patient infection status.  Positive results do  not rule out bacterial infection or co-infection with other viruses. If result is PRESUMPTIVE POSTIVE SARS-CoV-2 nucleic acids MAY BE PRESENT.   A presumptive  positive result was obtained on the submitted specimen  and confirmed on repeat testing.  While 2019 novel coronavirus  (SARS-CoV-2) nucleic acids may be present in the submitted sample  additional confirmatory testing may be necessary for epidemiological  and / or clinical management purposes  to differentiate between  SARS-CoV-2 and other Sarbecovirus currently known to infect humans.  If clinically indicated additional testing with an alternate test  methodology [phone]) is advised. The SARS-CoV-2 RNA is generally  detectable in upper and lower respiratory sp ecimens during the acute  phase of infection. The expected result is Negative. Fact Sheet for Patients:  StrictlyIdeas.no Fact Sheet for Healthcare Providers: BankingDealers.co.za This test is not yet approved or cleared by the [place] FDA and has been authorized for detection and/or diagnosis of SARS-CoV-2 by FDA under an Emergency Use Authorization (EUA).  This EUA will remain in effect (meaning this test can be used) for the duration of the COVID-19 declaration under Section 564(b)(1) of the Act, 21 U.S.C. section 360bbb-3(b)(1), unless the authorization is terminated or revoked sooner. Performed at [facility], [place] [address] [place], [place] [address]   Blood culture (routine x 2)     Status: None (Preliminary result)   Collection Time: 10/24/18  6:52 PM   Specimen: BLOOD  Result Value Ref Range Status   Specimen Description   Final    BLOOD BLOOD LEFT HAND Performed at [place] [address] [place], [place] [address]    Special Requests   Final    BOTTLES DRAWN AEROBIC ONLY Blood Culture adequate volume Performed at [place] [address], [place], [place] [address]    Culture   Final    NO GROWTH 1 DAY Performed at [facility], [place] [address], [place], [place] [address]    Report  Status PENDING  Incomplete  Blood culture (routine x 2)     Status: None (Preliminary result)   Collection Time: 10/24/18  6:52 PM   Specimen: BLOOD  Result Value Ref Range Status   Specimen Description   Final    BLOOD LEFT ANTECUBITAL Performed at [place] [address], [place], [place] [address]    Special Requests   Final    BOTTLES DRAWN AEROBIC ONLY Blood Culture adequate volume Performed at [place] [address], [place], [place] [address]    Culture   Final    NO GROWTH 1 DAY Performed at [facility], [place] [address], [place], [place]  [address]    Report Status PENDING  Incomplete     Studies: No results found.  Scheduled Meds: . allopurinol  100 mg Oral Daily  . apixaban  2.5 mg Oral BID  . cholecalciferol  1,000 Units Oral Daily  . cloNIDine  0.2 mg Oral BID  . [START ON 10/27/2018] feeding supplement (ENSURE ENLIVE)  237 mL Oral Q24H  . insulin aspart  0-5 Units Subcutaneous QHS  . insulin aspart  0-9 Units Subcutaneous TID WC  . multivitamin with minerals  1 tablet Oral Daily  . polyethylene glycol  17 g Oral Daily  . senna  1 tablet Oral BID  . sodium chloride flush  3 mL Intravenous Q12H    Continuous Infusions: . sodium chloride 75 mL/hr at 10/26/18 1232  . cefTRIAXone (ROCEPHIN)  IV 1 g (10/26/18 1416)

## 2018-10-26 NOTE — Progress Notes (Signed)
Initial Nutrition Assessment  DOCUMENTATION CODES:   Not applicable  INTERVENTION:  - will order Ensure Enlive once/day, each supplement provides 350 kcal and 20 grams of protein. - will order Magic Cup with dinner meals, each supplement provides 290 kcal and 9 grams of protein. - will order daily multivitamin with minerals. - continue to encourage PO intakes.  - weigh patient today.    NUTRITION DIAGNOSIS:   Increased nutrient needs related to acute illness as evidenced by estimated needs.  GOAL:   Patient will meet greater than or equal to 90% of their needs  MONITOR:   PO intake, Supplement acceptance, Labs, Weight trends  REASON FOR ASSESSMENT:   Malnutrition Screening Tool  ASSESSMENT:   85 y.o. female with past medical history of HTN, hyperlipidemia, type 2 DM, stroke, DVT/PE, CKD stage 3, and R breast cancer. She lives at home with her husband and ambulates with a walker. She presented to the ED on 8/22 with generalized weakness and chills x2 days and burning with urination. Home Health nurse also reported that patient had AMS. Urinalysis showed cloudy yellow urine with large amount of leukocytes, many bacteria and more than 50 WBCs.  Per flow sheet, patient consumed 50% of breakfast and lunch (total of 649 kcal, 32 grams protein) yesterday and 25% of breakfast this AM (127 kcal, 6 grams protein). Patient reports good appetite PTA until the 2-3 days PTA when she began to have weakness and chills. Patient denies any chewing or swallowing difficulties at baseline.   Patient does not think that she has had any weight changes recently, but she does not recall for sure. Per chart review, last recorded weight was on 8/5 when she weighed 164 lb/74.8 kg which identical to recordings on 09/26/18 and 01/21/18. Weight appears to have been stable for the past 1.5 years but unable to confirm without determining current weight.  She ambulates with a walker at baseline and reports that  her and her husband have 24/7 care at home.   Per notes: - UTI with L flank and LUQ tenderness - acute toxic metabolic encephalopathy--related to UTI and AKI; resolving (a/o x4 this AM) -  DM with peripheral neuropathy   Labs reviewed; CBG: 165 mg/dl today, BUN: 70 mg/dl, creatinine: 2.22 mg/dl, Ca: 11.7 mg/dl, GFR: 19 ml/min.  Medications reviewed; 1000 units vitamin D/day, sliding scale novolog, 1 packet miralax/day, 1 tablet senokot BID. IVF; NS @ 75 ml/hr.     NUTRITION - FOCUSED PHYSICAL EXAM:  completed; no muscle and no fat wasting, mild edema to BLE.   Diet Order:   Diet Order            Diet Carb Modified Fluid consistency: Thin; Room service appropriate? Yes  Diet effective now              EDUCATION NEEDS:   No education needs have been identified at this time  Skin:  Skin Assessment: Reviewed RN Assessment  Last BM:  8/23  Height:   Ht Readings from Last 1 Encounters:  10/26/18 4\' 11"  (1.499 m)    Weight:   Wt Readings from Last 1 Encounters:  10/07/18 74.8 kg    Ideal Body Weight:  42.7 kg  BMI:  Body mass index is 33.33 kg/m.  Estimated Nutritional Needs:   Kcal:  1650-1850 kcal  Protein:  70-80 grams  Fluid:  >/= 2 L/day     Jarome Matin, MS, RD, LDN, CNSC Inpatient Clinical Dietitian Pager # [phone] After hours/weekend pager #  [phone]  

## 2018-10-26 NOTE — Progress Notes (Signed)
Pharmacy Antibiotic Note  Sabrina [name] is a 85 y.o. female admitted on 10/24/2018 with UTI.  Pharmacy has been consulted for vancomycin dosing.  Urine culture growing enterococcus, sensitivities pending. Antibiotics being changed from ceftriaxone to vancomycin.  Today, 10/26/18  WBC WNL  SCr 2.22, CrCl ~15 mL/min  Plan:  Vancomycin 1500 mg LD followed by vancomycin 500 mg IV q48h  Goal AUC 400-550  SCr daily  Follow renal function and sensitivities  Check vancomycin levels once at steady state if indicated  Height: 4\' 11"  (149.9 cm) Weight: 172 lb 9.9 oz (78.3 kg) IBW/kg (Calculated) : 43.2  Temp (24hrs), Avg:98.5 F (36.9 C), Min:98.3 F (36.8 C), Max:98.6 F (37 C)  Recent Labs  Lab 10/24/18 1225 10/25/18 0551 10/26/18 0800  WBC 8.8 12.5* 7.5  CREATININE 2.66* 2.46* 2.22*    Estimated Creatinine Clearance: 15.5 mL/min (A) (by C-G formula based on SCr of 2.22 mg/dL (H)).    Allergies  Allergen Reactions  . Beta Adrenergic Blockers    Antimicrobials this admission: vancomycin 8/24 >>  ceftriaxone 8/22 >> 8/24  Dose adjustments this admission:  Microbiology results: 8/22 BCx: ngtd 8/22 UCx: 40K enterococcus faecium   Thank you for allowing pharmacy to be a part of this patient's care.  Lenis Noon, PharmD 10/26/2018 7:07 PM

## 2018-10-26 NOTE — TOC Initial Note (Signed)
Transition of Care [facility]) - Initial/Assessment Note    Patient Details  Name: Sabrina [name] MRN: [id number] Date of Birth: 16-Nov-1926  Transition of [facility]) CM/SW Contact:    Lia Hopping, [place] Phone Number: 10/26/2018, 1:06 PM  Clinical Narrative:                 Patient active with [facility] for PT, RN. Patient agreeable to resume services. Patient has 24/7 private aide to assist with her care and spouse care. She reports she uses a walker and wheelchair at home.  CSW confirm with [facility] rep. Santiago Glad  Expected Discharge Plan: [place] Barriers to Discharge: No Barriers Identified   Patient Goals and CMS Choice Patient states their goals for this hospitalization and ongoing recovery are:: Return Home CMS Medicare.gov Compare Post Acute Care list provided to:: Patient Choice offered to / list presented to : Patient  Expected Discharge Plan and Services Expected Discharge Plan: [place] In-house Referral: Clinical Social Work   Post Acute Care Choice: [place] arrangements for the past 2 months: [place] Expected Discharge Date: (unsure)                         HH Arranged: PT, OT, RN [place] Agency: [place] ([facility]) Date HH Agency Contacted: 10/26/18 Time [place]: 1303 Representative spoke with at [place]: Santiago Glad  Prior Living Arrangements/Services Living arrangements for the past 2 months: [place] with:: Spouse Patient language and need for interpreter reviewed:: No Do you feel safe going back to the place where you live?: Yes      Need for Family Participation in Patient Care: Yes (Comment) Care giver support system in place?: Yes (comment) Current home services: Home PT, Home RN, Other (comment)(Private 24/7 Caregiver) Criminal Activity/Legal Involvement Pertinent to Current Situation/Hospitalization: No - Comment as needed  Activities of Daily  Living Home Assistive Devices/Equipment: Bedside commode/3-in-1, Blood pressure cuff, Cane (specify quad or straight), CBG Meter, Dentures (specify type), Eyeglasses, Grab bars in shower, Raised toilet seat with rails, Walker (specify type), Wheelchair ADL Screening (condition at time of admission) Patient's cognitive ability adequate to safely complete daily activities?: Yes Is the patient deaf or have difficulty hearing?: Yes(left ear. ) Does the patient have difficulty seeing, even when wearing glasses/contacts?: Yes Does the patient have difficulty concentrating, remembering, or making decisions?: Yes Patient able to express need for assistance with ADLs?: Yes Does the patient have difficulty dressing or bathing?: Yes Independently performs ADLs?: No Communication: Independent Dressing (OT): Needs assistance Is this a change from baseline?: Pre-admission baseline Grooming: Needs assistance Is this a change from baseline?: Pre-admission baseline Feeding: Needs assistance Is this a change from baseline?: Pre-admission baseline Bathing: Needs assistance Is this a change from baseline?: Pre-admission baseline Toileting: Needs assistance Is this a change from baseline?: Pre-admission baseline In/Out Bed: Needs assistance Is this a change from baseline?: Pre-admission baseline Walks in Home: Needs assistance(uses walker) Is this a change from baseline?: Pre-admission baseline Does the patient have difficulty walking or climbing stairs?: Yes Weakness of Legs: Both Weakness of Arms/Hands: Left(broken left clavicle)  Permission Sought/Granted Permission sought to share information with : Family Supports Permission granted to share information with : Yes, Verbal Permission Granted        Permission granted to share info w Relationship: Sister     Emotional Assessment Appearance:: Appears stated age  Affect (typically observed): Accepting, Pleasant Orientation: : Oriented to Self,  Oriented to Place, Oriented to  Time, Oriented to Situation Alcohol / Substance Use: Not Applicable Psych Involvement: No (comment)  Admission diagnosis:  Acute cystitis with hematuria [N30.01] Acute kidney injury [facility]) [N17.9] Patient Active Problem List   Diagnosis Date Noted  . Acute metabolic encephalopathy 96/28/3662  . UTI (urinary tract infection) 10/24/2018  . Clavicle fracture 09/26/2018  . Blurry vision, bilateral   . Acute blood loss anemia   . History of breast cancer 05/30/2017  . Weakness 05/30/2017  . Occipital infarction ([place]) 05/30/2017  . Pressure injury of skin 05/28/2017  . Fall at home 05/27/2017  . CKD (chronic kidney disease), stage IV ([place]) 05/27/2017  . High cholesterol 05/27/2017  . DM (diabetes mellitus) ([place]) 05/27/2017  . Hypertension 05/27/2017  . Gout 05/27/2017  . Elevated troponin 05/27/2017  . Lipoma of lower extremity 09/12/2014  . Abnormal x-ray 03/15/2014  . Chronic diastolic congestive heart failure ([place]) 02/23/2014  . Osteopenia 11/30/2013  . Hypoglycemia 09/13/2013  . Acute respiratory failure with hypoxia ([place]) 06/14/2013  . History of pulmonary embolism 06/10/2013  . Nausea and vomiting 06/10/2013  . Accelerated hypertension 06/10/2013  . Aortic stenosis 04/22/2013  . Edema leg 04/22/2013  . Breast cancer of upper-outer quadrant of right female breast ([place]) 03/19/2013   PCP:  [name], MD Pharmacy:   [facility] [place] [address] Phone: [phone] Fax: [phone]     Social Determinants of Health (SDOH) Interventions    Readmission Risk Interventions No flowsheet data found.

## 2018-10-27 ENCOUNTER — Inpatient Hospital Stay (HOSPITAL_COMMUNITY): Payer: Medicare Other

## 2018-10-27 DIAGNOSIS — C50919 Malignant neoplasm of unspecified site of unspecified female breast: Secondary | ICD-10-CM

## 2018-10-27 DIAGNOSIS — R5381 Other malaise: Secondary | ICD-10-CM

## 2018-10-27 DIAGNOSIS — I10 Essential (primary) hypertension: Secondary | ICD-10-CM

## 2018-10-27 DIAGNOSIS — N179 Acute kidney failure, unspecified: Secondary | ICD-10-CM

## 2018-10-27 LAB — CBC
HCT: 29.8 % — ABNORMAL LOW (ref 36.0–46.0)
Hemoglobin: 9.2 g/dL — ABNORMAL LOW (ref 12.0–15.0)
MCH: 31.9 pg (ref 26.0–34.0)
MCHC: 30.9 g/dL (ref 30.0–36.0)
MCV: 103.5 fL — ABNORMAL HIGH (ref 80.0–100.0)
Platelets: 159 10*3/uL (ref 150–400)
RBC: 2.88 MIL/uL — ABNORMAL LOW (ref 3.87–5.11)
RDW: 14.1 % (ref 11.5–15.5)
WBC: 11.2 10*3/uL — ABNORMAL HIGH (ref 4.0–10.5)
nRBC: 0 % (ref 0.0–0.2)

## 2018-10-27 LAB — BASIC METABOLIC PANEL
Anion gap: 5 (ref 5–15)
BUN: 58 mg/dL — ABNORMAL HIGH (ref 8–23)
CO2: 28 mmol/L (ref 22–32)
Calcium: 11.5 mg/dL — ABNORMAL HIGH (ref 8.9–10.3)
Chloride: 107 mmol/L (ref 98–111)
Creatinine, Ser: 1.93 mg/dL — ABNORMAL HIGH (ref 0.44–1.00)
GFR calc Af Amer: 26 mL/min — ABNORMAL LOW (ref >60)
GFR calc non Af Amer: 23 mL/min — ABNORMAL LOW (ref >60)
Glucose, Bld: 216 mg/dL — ABNORMAL HIGH (ref 70–99)
Potassium: 3.5 mmol/L (ref 3.5–5.1)
Sodium: 140 mmol/L (ref 135–145)

## 2018-10-27 LAB — GLUCOSE, CAPILLARY
Glucose-Capillary: 191 mg/dL — ABNORMAL HIGH (ref 70–99)
Glucose-Capillary: 199 mg/dL — ABNORMAL HIGH (ref 70–99)
Glucose-Capillary: 170 mg/dL — ABNORMAL HIGH (ref 70–99)
Glucose-Capillary: 141 mg/dL — ABNORMAL HIGH (ref 70–99)

## 2018-10-27 LAB — URINE CULTURE
Culture: 40000 — AB
Special Requests: NORMAL

## 2018-10-27 MED ORDER — LEVALBUTEROL HCL 1.25 MG/0.5ML IN NEBU
1.2500 mg | INHALATION_SOLUTION | Freq: Once | RESPIRATORY_TRACT | Status: AC
  Administered 2018-10-27: 1.25 mg via RESPIRATORY_TRACT
  Filled 2018-10-27: qty 0.5

## 2018-10-27 MED ORDER — LEVALBUTEROL HCL 0.63 MG/3ML IN NEBU: 0.6300 mg | INHALATION_SOLUTION | Freq: Four times a day (QID) | RESPIRATORY_TRACT | Status: DC | PRN

## 2018-10-27 MED ORDER — AMOXICILLIN-POT CLAVULANATE 250-125 MG PO TABS
1.0000 | ORAL_TABLET | Freq: Two times a day (BID) | ORAL | Status: DC
  Administered 2018-10-27 – 2018-10-28 (×2): 1 via ORAL
  Filled 2018-10-27 (×3): qty 1

## 2018-10-27 MED ORDER — CLONIDINE HCL 0.1 MG PO TABS
0.1000 mg | ORAL_TABLET | Freq: Once | ORAL | Status: AC
  Administered 2018-10-27: 0.1 mg via ORAL
  Filled 2018-10-27: qty 1

## 2018-10-27 MED ORDER — FUROSEMIDE 10 MG/ML IJ SOLN
40.0000 mg | Freq: Once | INTRAMUSCULAR | Status: AC
  Administered 2018-10-27: 40 mg via INTRAVENOUS
  Filled 2018-10-27: qty 4

## 2018-10-27 NOTE — Progress Notes (Signed)
MEWS Guidelines - (patients age 83 and over)  Red - At High Risk for Deterioration Yellow - At risk for Deterioration  1. Go to room and assess patient 2. Validate data. Is this patient's baseline? If data confirmed: 3. Is this an acute change? 4. Administer prn meds/treatments as ordered. 5. Note Sepsis score 6. Review goals of care 7. Sports coach, RRT nurse and Provider. 8. Ask Provider to come to bedside.  9. Document patient condition/interventions/response. 10. Increase frequency of vital signs and focused assessments to at least q15 minutes x 4, then q30 minutes x2. - If stable, then q1h x3, then q4h x3 and then q8h or dept. routine. - If unstable, contact Provider & RRT nurse. Prepare for possible transfer. 11. Add entry in progress notes using the smart phrase ".MEWS". 1. Go to room and assess patient 2. Validate data. Is this patient's baseline? If data confirmed: 3. Is this an acute change? 4. Administer prn meds/treatments as ordered? 5. Note Sepsis score 6. Review goals of care 7. Sports coach and Provider 8. Call RRT nurse as needed. 9. Document patient condition/interventions/response. 10. Increase frequency of vital signs and focused assessments to at least q2h x2. - If stable, then q4h x2 and then q8h or dept. routine. - If unstable, contact Provider & RRT nurse. Prepare for possible transfer. 11. Add entry in progress notes using the smart phrase ".MEWS".  Green - Likely stable [place] - Comfort Care Only  1. Continue routine/ordered monitoring.  2. Review goals of care. 1. Continue routine/ordered monitoring. 2. Review goals of care.    Patient's MEWS score went into the yellow just recently.  Patient's pulse is elevated 116.  She is c/o shortness of breath and audible wheezing was heard upon assessment. This RN notified physician (d/t ? Volume overload; lasix stopped recently).  Xray was obtained, and 1 x Dose of 40 mg lasix was given.  Patient already sounding much better. She was also placed back on 2l 02 via nasal cannula (RA she was at 90 percent). Will continue to monitor patient and check vitals frequently.Roderick Pee

## 2018-10-27 NOTE — Progress Notes (Signed)
Update to previous yellow MEWs score progress note: Patient is doing much better.  After receiving lasix and a breathing treatment, she is no longer wheezing at rest.  Patient's Bp is much lower 117/43 (MD notified about diastolic number per order).  Pulse is back to normal. Will continue to monitor. Roderick Pee

## 2018-10-27 NOTE — Progress Notes (Addendum)
PROGRESS NOTE  Sabrina [name] VQM:[id number] DOB: [birth year] DOA: 10/24/2018 PCP: [name], MD  Brief summary: Sabrina [name] is a 85 y.o. female with past medical history of hypertension, hyperlipidemia, T2DM, stroke, DVT/PE, CKD3, right breast cancer who lives at home with her husband.  She is able to ambulate with a walker. Patient was brought in the ED today from home with generalized weakness and chills for last 2 days with burning urination.  Home health care nurse also reported altered mental status to EMS.  In the ED, patient had a temperature 100.6, blood pressure 168/76, heart rate 82, breathing comfortably on room air. Labs showed WBC count 8.8, hemoglobin 10.7, platelet 189, sodium 141, potassium 3.9, serum bicarb 30, BUN/creatinine 97/2.66, glucose level 186. COVID-19 test negative Urinalysis showed cloudy yellow urine with large amount of leukocytes, many bacteria and more than 50 WBCs Hospital service was consulted for inpatient admission and management.  At the time of evaluation, patient was alert, awake, oriented x3 but slow to respond.  She does not believe that she was confused prior to presentation.  She lives at her house with her husband.  She is able to ambulate with a walker. Her sister is the power of attorney.  Patient is a full code.   HPI/Recap of past 24 hours:  Patient is alert and w/o complaints.   Assessment/Plan: Principal Problem:   UTI (urinary tract infection) Active Problems:   Accelerated hypertension   CKD (chronic kidney disease), stage IV (HCC)   Physical debility   Acute metabolic encephalopathy   Breast cancer of upper-outer quadrant of right female breast ([place]); in remission   Chronic diastolic congestive heart failure (HCC)   DM (diabetes mellitus) ([place])   Personal history of DVT and pulm embolus   Acute kidney injury ([place])   UTI /left flank and left upper quadrant tenderness --Urine culture + 40K Enterococcus/  Faecium, blood cx's negative -CT scan of abdomen/pel no acute findings - got IV Rocephin x 3 d, Vanc x 1d > now changing to po Augmentin to complete 7 day course - should be OK for dc home Wed; pt has 24/7 paid aides for assistance, her husband lives at home but has dementia - PT rec's were for dc home w/ home PT  Acute toxic metabolic encephalopathy -Related to UTI and AKI. -Resolving, mental status sig improved today per staff  AKI on CKD stage III -Creatinine 1.7 at baseline, elevated to 2.66 > 2.4 > 2.2 today, imrpoving -improving, continue ivf, renal dosing meds.   Cardiovascular issues : hypertension, hyperlipidemia - Prior to initial, patient was on clonidine, Lasix, hydralazine.  - Resume clonidine at a lower dose.  Keep hydralazine and Lasix on hold.  PRN hydralazine ordered - BP's are low-normal, cont low dose clonidine, holding other meds  Diabetes mellitus with peripheral neuropathy -Not on insulin.  Keep glipizide on hold because of AKI.  Keep Neurontin on hold until mental status clears - will ^ stre\ngth of SSI  BS's are 150- 180 range  History of DVT/ PE -On Eliquis.  Right breast cancer ( ER+, PR+, Her2+)s/p lumpectomy/adjuvenat chemo/immunotherapy, finished 73yranastrozole in 2020 -in remission, no edivence of disease recurrent, she is "very likely cured" per oncology Dr MJana Hakimon 8/23. -On denosumab for osteopenia  Debility - PT has seen and rec's are for Home Health PT, 24hr supervisoin/ assistance  DVT Prophylaxis: on eliquis Code Status: full Family Communication: patient Disposition Plan: She has  No children , Lives  at home with husband who has dementia, she reports has 24/7 care at home (one person at a time), she walks with a walker at baseline.   Kelly Splinter MD  [facility]  pgr [phone] 10/27/2018, 6:32 PM       Consultants:  none  Procedures:  none  Antibiotics:  rocephin   Objective: BP (!) 121/103 (BP Location: Left Arm)    Pulse (!) 55   Temp 97.7 F (36.5 C) (Oral)   Resp (!) 22   Ht 4' 11"  (1.499 m)   Wt 78.3 kg   SpO2 98%   BMI 34.87 kg/m   Intake/Output Summary (Last 24 hours) at 10/27/2018 1832 Last data filed at 10/27/2018 1000 Gross per 24 hour  Intake 1775.25 ml  Output 850 ml  Net 925.25 ml   Filed Weights   10/26/18 1656  Weight: 78.3 kg    Exam: Patient is examined daily including today on 10/27/2018, exams remain the same as of yesterday except that has changed    General:   aaox3, but confused, keep repeating herself,   Cardiovascular: RRR  Respiratory: CTABL  Abdomen: small reducible umbilical hernia, Soft/ND/NT, positive BS  Musculoskeletal: No Edema  Neuro:  oriented x3 but confused  Data Reviewed: Basic Metabolic Panel: Recent Labs  Lab 10/24/18 1225 10/25/18 0551 10/26/18 0800 10/27/18 0546  NA 141 145 142 140  K 3.9 4.2 3.9 3.5  CL 99 104 107 107  CO2 30 27 27 28   GLUCOSE 186* 232* 179* 216*  BUN 97* 83* 70* 58*  CREATININE 2.66* 2.46* 2.22* 1.93*  CALCIUM 12.6* 12.2* 11.7* 11.5*   Liver Function Tests: Recent Labs  Lab 10/24/18 1225  AST 14*  ALT 13  ALKPHOS 96  BILITOT 0.8  PROT 7.1  ALBUMIN 3.8   No results for input(s): LIPASE, AMYLASE in the last 168 hours. No results for input(s): AMMONIA in the last 168 hours. CBC: Recent Labs  Lab 10/24/18 1225 10/25/18 0551 10/26/18 0800 10/27/18 0546  WBC 8.8 12.5* 7.5 11.2*  NEUTROABS 4.8  --   --   --   HGB 10.7* 10.6* 8.7* 9.2*  HCT 33.2* 32.9* 28.0* 29.8*  MCV 101.5* 101.5* 104.5* 103.5*  PLT 189 200 146* 159   Cardiac Enzymes:   No results for input(s): CKTOTAL, CKMB, CKMBINDEX, TROPONINI in the last 168 hours. BNP (last 3 results) No results for input(s): BNP in the last 8760 hours.  ProBNP (last 3 results) No results for input(s): PROBNP in the last 8760 hours.  CBG: Recent Labs  Lab 10/26/18 1649 10/26/18 2111 10/27/18 0752 10/27/18 1152 10/27/18 1609  GLUCAP 160* 198*  191* 199* 170*    Recent Results (from the past 240 hour(s))  Urine culture     Status: Abnormal   Collection Time: 10/24/18 12:32 PM   Specimen: Urine, Clean Catch  Result Value Ref Range Status   Specimen Description   Final    URINE, CLEAN CATCH Performed at [facility], [place] [address], [place], [place] [address]    Special Requests   Final    Normal Performed at [facility], [place] [address], [place], [place] [address]    Culture 40,000 COLONIES/mL ENTEROCOCCUS FAECIUM (A)  Final   Report Status 10/27/2018 FINAL  Final   Organism ID, Bacteria ENTEROCOCCUS FAECIUM (A)  Final      Susceptibility   Enterococcus faecium - MIC*    AMPICILLIN <=2 SENSITIVE Sensitive     LEVOFLOXACIN 2  SENSITIVE Sensitive     NITROFURANTOIN 64 INTERMEDIATE Intermediate     VANCOMYCIN <=0.5 SENSITIVE Sensitive     * 40,000 COLONIES/mL ENTEROCOCCUS FAECIUM  SARS Coronavirus 2 [facility] order, Performed in [facility] hospital lab) Nasopharyngeal Nasopharyngeal Swab     Status: None   Collection Time: 10/24/18  1:43 PM   Specimen: Nasopharyngeal Swab  Result Value Ref Range Status   SARS Coronavirus 2 NEGATIVE NEGATIVE Final    Comment: (NOTE) If result is NEGATIVE SARS-CoV-2 target nucleic acids are NOT DETECTED. The SARS-CoV-2 RNA is generally detectable in upper and lower  respiratory specimens during the acute phase of infection. The lowest  concentration of SARS-CoV-2 viral copies this assay can detect is 250  copies / mL. A negative result does not preclude SARS-CoV-2 infection  and should not be used as the sole basis for treatment or other  patient management decisions.  A negative result may occur with  improper specimen collection / handling, submission of specimen other  than nasopharyngeal swab, presence of viral mutation(s) within the  areas targeted by this assay, and inadequate number of viral copies  (<250 copies / mL). A negative result must  be combined with clinical  observations, patient history, and epidemiological information. If result is POSITIVE SARS-CoV-2 target nucleic acids are DETECTED. The SARS-CoV-2 RNA is generally detectable in upper and lower  respiratory specimens dur ing the acute phase of infection.  Positive  results are indicative of active infection with SARS-CoV-2.  Clinical  correlation with patient history and other diagnostic information is  necessary to determine patient infection status.  Positive results do  not rule out bacterial infection or co-infection with other viruses. If result is PRESUMPTIVE POSTIVE SARS-CoV-2 nucleic acids MAY BE PRESENT.   A presumptive positive result was obtained on the submitted specimen  and confirmed on repeat testing.  While 2019 novel coronavirus  (SARS-CoV-2) nucleic acids may be present in the submitted sample  additional confirmatory testing may be necessary for epidemiological  and / or clinical management purposes  to differentiate between  SARS-CoV-2 and other Sarbecovirus currently known to infect humans.  If clinically indicated additional testing with an alternate test  methodology [phone]) is advised. The SARS-CoV-2 RNA is generally  detectable in upper and lower respiratory sp ecimens during the acute  phase of infection. The expected result is Negative. Fact Sheet for Patients:  StrictlyIdeas.no Fact Sheet for Healthcare Providers: BankingDealers.co.za This test is not yet approved or cleared by the [place] FDA and has been authorized for detection and/or diagnosis of SARS-CoV-2 by FDA under an Emergency Use Authorization (EUA).  This EUA will remain in effect (meaning this test can be used) for the duration of the COVID-19 declaration under Section 564(b)(1) of the Act, 21 U.S.C. section 360bbb-3(b)(1), unless the authorization is terminated or revoked sooner. Performed at [facility], [place] [address] [place], [place] [address]   Blood culture (routine x 2)     Status: None (Preliminary result)   Collection Time: 10/24/18  6:52 PM   Specimen: BLOOD  Result Value Ref Range Status   Specimen Description   Final    BLOOD BLOOD LEFT HAND Performed at [facility] [address] [place], [place] [address]    Special Requests   Final    BOTTLES DRAWN AEROBIC ONLY Blood Culture adequate volume Performed at [facility] [address], [place], [place] [address]    Culture   Final  NO GROWTH 2 DAYS Performed at [facility], [place] [address] [place], [place] [address]    Report Status PENDING  Incomplete  Blood culture (routine x 2)     Status: None (Preliminary result)   Collection Time: 10/24/18  6:52 PM   Specimen: BLOOD  Result Value Ref [place] Status   Specimen Description   Final    BLOOD LEFT ANTECUBITAL Performed at [facility] [address], [place], [place] [address]    Special Requests   Final    BOTTLES DRAWN AEROBIC ONLY Blood Culture adequate volume Performed at [facility] [address] [place], [place] [address]    Culture   Final    NO GROWTH 2 DAYS Performed at [facility] [address], [place], [place] [address]    Report Status PENDING  Incomplete     Studies: Dg Chest Port 1 View  Result Date: 10/27/2018 CLINICAL DATA:  85 year old female with shortness of breath EXAM: PORTABLE CHEST 1 VIEW COMPARISON:  Chest radiograph dated 10/24/2018 FINDINGS: Evaluation is limited due to patient positioning and superimposition of the mandible over the lung apices. Probable background of mild emphysema. Bibasilar atelectatic changes versus less likely infiltrate. Faint streaky density in the right upper lobe may correspond to the scarring seen the CT of 03/22/2015. No lobar consolidation, pleural effusion, or pneumothorax. The right  costophrenic angle has been excluded from the image. The cardiac silhouette is within normal limits. Calcified mitral annulus. Loop recorder device. Degenerative changes of the spine and shoulders. Right axillary surgical clips. No acute osseous pathology. IMPRESSION: Bibasilar atelectatic changes versus less likely infiltrate. Electronically Signed   By: Anner Crete M.D.   On: 10/27/2018 01:32    Scheduled Meds: . allopurinol  100 mg Oral Daily  . amoxicillin-clavulanate  1 tablet Oral BID  . apixaban  2.5 mg Oral BID  . cholecalciferol  1,000 Units Oral Daily  . cloNIDine  0.2 mg Oral BID  . feeding supplement (ENSURE ENLIVE)  237 mL Oral Q24H  . insulin aspart  0-20 Units Subcutaneous TID WC  . insulin aspart  0-5 Units Subcutaneous QHS  . multivitamin with minerals  1 tablet Oral Daily  . polyethylene glycol  17 g Oral Daily  . senna  1 tablet Oral BID  . sodium chloride flush  3 mL Intravenous Q12H    Continuous Infusions: . sodium chloride 45 mL/hr at 10/27/18 1156

## 2018-10-27 NOTE — Progress Notes (Signed)
Physical Therapy Treatment Patient Details Name: Sabrina [name] MRN: [id number] DOB: 1926/12/30 Today's Date: 10/27/2018    History of Present Illness 85 y.o. female with past medical history of hypertension, hyperlipidemia, T2DM, stroke, DVT/PE, CKD3, right breast cancer, recent L clavicle fx admitted through ED 10/24/18  from home with generalized weakness and chills-->UTI    PT Comments    Pt very cognitively intact stating she has 24/7 caregivers to assist at home with her and her husabnd. She usally walks with RW in the home and was [facility]. She also uses "[employer]" for transportation and may use them for DC home. She uses a WC for out of the home mobility. Woked hard todayw with transfer to 3N1 ( encouraged nursing to use this unstead of purewick and bedpan), then stood for endurance for about 4 minutes during hygiene and walked to recliner for dinner all with RW. Pt has limited use of LLE due to clavicle fracture per pt in June.     Follow Up Recommendations  Home health PT;Supervision/Assistance - 24 hour(pt states she was getting HHPT from [facility] prior to hospitalization)     Equipment Recommendations  None recommended by PT    Recommendations for Other Services       Precautions / Restrictions Precautions Precautions: Fall Restrictions Weight Bearing Restrictions: No    Mobility  Bed Mobility Overal bed mobility: Needs Assistance Bed Mobility: Supine to Sit     Supine to sit: Mod assist     General bed mobility comments: increased time and difficulty scooting, however she states she sleeps in a chari with a foot stool becaseu her husband takes up too much room in the bed.  Transfers Overall transfer level: Needs assistance Equipment used: Rolling walker (2 wheeled) Transfers: Sit to/from [facility] Sit to Stand: Mod assist(min to mod, just has to take time on descent so she  doesn't "plop")            Ambulation/Gait Ambulation/Gait assistance: Min assist Gait Distance (Feet): 5 Feet Assistive device: Rolling walker (2 wheeled) Gait Pattern/deviations: Step-through pattern     General Gait Details: small steps, flexed posture at RW and not a lot of function /use with LUE due to clavicle fracture in June per pt   Stairs             Wheelchair Mobility    Modified Rankin (Stroke Patients Only)       Balance                                            Cognition Arousal/Alertness: Awake/alert Behavior During Therapy: WFL for tasks assessed/performed Overall Cognitive Status: Within Functional Limits for tasks assessed                                        Exercises      General Comments        Pertinent Vitals/Pain Pain Assessment: 0-10 Pain Score: 3  Pain Location: My bottom is sore Pain Descriptors / Indicators: Burning Pain Intervention(s): Monitored during session    Home Living                      Prior Function  PT Goals (current goals can now be found in the care plan section) Acute Rehab PT Goals Patient Stated Goal: get stonger PT Goal Formulation: With patient Time For Goal Achievement: 11/08/18 Potential to Achieve Goals: Good Progress towards PT goals: Progressing toward goals    Frequency    Min 3X/week      PT Plan      Co-evaluation              AM-PAC PT "6 Clicks" Mobility   Outcome Measure  Help needed turning from your back to your side while in a flat bed without using bedrails?: A Lot Help needed moving from lying on your back to sitting on the side of a flat bed without using bedrails?: A Lot Help needed moving to and from a bed to a chair (including a wheelchair)?: A Little Help needed standing up from a chair using your arms (e.g., wheelchair or bedside chair)?: A Little Help needed to walk in hospital room?: A  Little Help needed climbing 3-5 steps with a railing? : A Lot 6 Click Score: 15    End of Session Equipment Utilized During Treatment: Gait belt Activity Tolerance: Patient tolerated treatment well Patient left: in chair;with call bell/phone within reach(no alarms on unit) Nurse Communication: Mobility status PT Visit Diagnosis: Difficulty in walking, not elsewhere classified (R26.2);Muscle weakness (generalized) (M62.81)     Time: 3744-5146 PT Time Calculation (min) (ACUTE ONLY): 49 min  Charges:  $Gait Training: 8-22 mins $Therapeutic Activity: 23-37 mins                     Clide Dales, PT Acute Rehabilitation Services Pager: [phone] Office: [phone] 10/27/2018    Clide Dales 10/27/2018, 7:04 PM

## 2018-10-27 NOTE — Progress Notes (Signed)
[facility] Summary Report / Loop Recorder 

## 2018-10-27 NOTE — Care Management Important Message (Signed)
Important Message  Patient Details IM Letter given to Sharren Bridge SW to present to the Patient Name: Sabrina [name] MRN: [id number] Date of Birth: [birth year]   Medicare Important Message Given:  Yes     Kerin Salen 10/27/2018, 10:30 AM

## 2018-10-28 DIAGNOSIS — N3001 Acute cystitis with hematuria: Principal | ICD-10-CM

## 2018-10-28 LAB — GLUCOSE, CAPILLARY
Glucose-Capillary: 186 mg/dL — ABNORMAL HIGH (ref 70–99)
Glucose-Capillary: 178 mg/dL — ABNORMAL HIGH (ref 70–99)

## 2018-10-28 MED ORDER — LIP MEDEX EX OINT
TOPICAL_OINTMENT | CUTANEOUS | Status: AC
  Administered 2018-10-28: 13:00:00
  Filled 2018-10-28: qty 7

## 2018-10-28 MED ORDER — AMOXICILLIN-POT CLAVULANATE 250-125 MG PO TABS: 1.0000 | ORAL_TABLET | Freq: Two times a day (BID) | ORAL | 0 refills | Status: AC

## 2018-10-28 MED ORDER — HYDRALAZINE HCL 10 MG PO TABS
10.0000 mg | ORAL_TABLET | Freq: Three times a day (TID) | ORAL | Status: DC
  Filled 2018-10-28 (×3): qty 1

## 2018-10-28 NOTE — Discharge Summary (Signed)
Physician Discharge Summary  Carmichael Holton ZHG:[id number] DOB: 1926/05/05 DOA: 10/24/2018  PCP: [name], MD  Admit date: 10/24/2018 Discharge date: 10/28/2018  Admitted From: Home Disposition:  Home  Recommendations for Outpatient Follow-up:  1. Follow up with PCP in 1-2 weeks 2. Recommend repeat BMET in 1 week with PCP  Home Health:PT, RN   Discharge Condition:Stable CODE STATUS:Full Diet recommendation: Diabetic   Brief/Interim Summary: 85 y.o.femalewith past medical history of hypertension, hyperlipidemia, T2DM, stroke, DVT/PE, CKD3, right breast cancer who lives at home with her husband. She is able to ambulate with a walker. Patient was brought in the ED today from home with generalized weakness and chills for last 2 days with burning urination. Home health care nurse also reported altered mental status to EMS.  In the ED, patient had a temperature 100.6, blood pressure 168/76, heart rate 82, breathing comfortably on room air. Labs showed WBC count 8.8, hemoglobin 10.7, platelet 189, sodium 141, potassium 3.9, serum bicarb 30, BUN/creatinine 97/2.66, glucose level 186. COVID-19 test negative Urinalysis showed cloudy yellow urine with large amount of leukocytes, many bacteria and more than 50 WBCs Hospital service was consulted for inpatient admission and management.  At the time of evaluation, patient was alert, awake, oriented x3 but slow to respond. She does not believe that she was confused prior to presentation. She lives at her house with her husband.   Discharge Diagnoses:  Principal Problem:   UTI (urinary tract infection) Active Problems:   Breast cancer of upper-outer quadrant of right female breast ([place]); in remission   Accelerated hypertension   Chronic diastolic congestive heart failure (HCC)   CKD (chronic kidney disease), stage IV (HCC)   DM (diabetes mellitus) ([place])   Physical debility   Acute metabolic encephalopathy   Personal history of  DVT and pulm embolus   Acute kidney injury ([place])   Stage IV breast cancer in female [place])  UTI /left flank and left upper quadrant tenderness --Urine culture + 40K Enterococcus/ Faecium, blood cx's negative -CT scan of abdomen/pel no acute findings - got IV Rocephin x 3 d, Vanc x 1d > now changing to po Augmentin to complete 7 day course on 8/30 - PT rec's were for dc home w/ home PT  Acute toxic metabolic encephalopathy -Related to UTI and AKI. -Much improved  AKI on CKD stage III -Creatinine 1.7 at baseline, elevated to 2.66 > 2.4 > 2.2 > 1.93 -Recommend repeat bmet in one week with PCP  Cardiovascular issues : hypertension, hyperlipidemia - Prior to initial, patient was on clonidine, Lasix, hydralazine.  - Resume clonidine at a lower dose. Resumed home hydralazine  Diabetes mellitus with peripheral neuropathy -Not on insulin. Keep glipizide on hold because of AKI. Neurontin was temporarily on hold until mental status clears  History of DVT/ PE -On Eliquis.  Right breast cancer ( ER+, PR+, Her2+)s/p lumpectomy/adjuvenat chemo/immunotherapy, finished 70yranastrozole in 2020 -in remission, no edivence of disease recurrent, she is "very likely cured" per oncology Dr MJana Hakimon 8/23. -On denosumab for osteopenia  Debility - PT has seen [facility], 24hr supervisoin/ assistance   Discharge Instructions   Allergies as of 10/28/2018      Reactions   Beta Adrenergic Blockers       Medication List    TAKE these medications   allopurinol 100 MG tablet Commonly known as: ZYLOPRIM Take 100 mg by mouth daily.   amoxicillin-clavulanate 250-125 MG tablet Commonly known as: AUGMENTIN Take 1  tablet by mouth 2 (two) times daily for 5 days.   apixaban 2.5 MG Tabs tablet Commonly known as: Eliquis Take 1 tablet (2.5 mg total) by mouth 2 (two) times daily.   cholecalciferol 1000 units tablet Commonly known as: VITAMIN D Take 1,000 Units by  mouth daily.   cloNIDine 0.2 MG tablet Commonly known as: CATAPRES Take 0.2 mg by mouth 2 (two) times daily.   denosumab 60 MG/ML Sosy injection Commonly known as: PROLIA Inject 60 mg into the skin every 6 (six) months.   furosemide 40 MG tablet Commonly known as: LASIX Take 40 mg by mouth daily.   gabapentin 100 MG capsule Commonly known as: NEURONTIN Take 200-300 mg by mouth See admin instructions. Take 200 mg by mouth twice a day and take 300 mg by mouth at bedtime   glipiZIDE 5 MG tablet Commonly known as: GLUCOTROL Take 5 mg by mouth 2 (two) times daily before a meal.   hydrALAZINE 10 MG tablet Commonly known as: APRESOLINE Take 1 tablet (10 mg total) by mouth 3 (three) times daily.   hydrocortisone cream 1 % Apply 1 application topically daily as needed for itching.   polyethylene glycol 17 g packet Commonly known as: MIRALAX / GLYCOLAX Take 17 g by mouth daily.   traMADol 50 MG tablet Commonly known as: ULTRAM Take 50 mg by mouth 2 (two) times daily as needed for moderate pain.      Follow-up Information    Stoneking, Hal, MD. Schedule an appointment as soon as possible for a visit in 1 week(s).   Specialty: Internal Medicine Contact information: [address] [place] [address] [phone]          Allergies  Allergen Reactions  . Beta Adrenergic Blockers     Consultations:  Oncology  Procedures/Studies: Ct Abdomen Pelvis Wo Contrast  Result Date: 10/24/2018 CLINICAL DATA:  Abdominal pain. Left upper quadrant and flank tenderness. UTI. EXAM: CT ABDOMEN AND PELVIS WITHOUT CONTRAST TECHNIQUE: Multidetector CT imaging of the abdomen and pelvis was performed following the standard protocol without IV contrast. COMPARISON:  None. FINDINGS: Lower chest: 4 mm right lower lobe pulmonary nodule stable since chest CT of 08/20/2015, consistent with benign etiology such as scarring. Mitral annular calcification noted. Hepatobiliary: No  focal abnormality in the liver on this study without intravenous contrast. Calcified gallstones noted. No intrahepatic or extrahepatic biliary dilation. Pancreas: No focal mass lesion. No dilatation of the main duct. No intraparenchymal cyst. No peripancreatic edema. Spleen: No splenomegaly. No focal mass lesion. Adrenals/Urinary Tract: No adrenal nodule or mass. No renal stones or hydronephrosis in either kidney. No gross renal mass on this noncontrast study. No evidence for hydroureter. The urinary bladder appears normal for the degree of distention. Stomach/Bowel: Tiny hiatal hernia. Stomach otherwise unremarkable. Duodenum is normally positioned as is the ligament of Treitz. No small bowel wall thickening. No small bowel dilatation. The terminal ileum is normal. The appendix is not visualized, but there is no edema or inflammation in the region of the cecum. No gross colonic mass. No colonic wall thickening. Diverticular changes are noted in the left colon without evidence of diverticulitis. Vascular/Lymphatic: There is abdominal aortic atherosclerosis without aneurysm. There is no gastrohepatic or hepatoduodenal ligament lymphadenopathy. No intraperitoneal or retroperitoneal lymphadenopathy. No pelvic sidewall lymphadenopathy. Reproductive: Unremarkable. Other: No intraperitoneal free fluid. Musculoskeletal: No worrisome lytic or sclerotic osseous abnormality. Small paraumbilical midline hernia contains small bowel without complicating features. Hernia sac measures approximately 6.2 x 3.3 x  7.2 cm. IMPRESSION: 1. No acute findings in the abdomen or pelvis. No findings to explain the patient's history of left upper quadrant flank tenderness. 2. Paraumbilical midline ventral hernia contains small bowel without complicating features. 3. Tiny hiatal hernia. 4.  Aortic Atherosclerois (ICD10-170.0) Electronically Signed   By: Misty Stanley M.D.   On: 10/24/2018 16:28   Dg Chest 2 View  Result Date:  10/24/2018 CLINICAL DATA:  Weakness and shaking for 2 days. EXAM: CHEST - 2 VIEW COMPARISON:  08/15/2017 FINDINGS: Nodular opacity in the right apex is new in the interval. Patchy airspace opacity is seen in the peripheral right upper lobe. Left lung clear. No pleural effusion. Cardiopericardial silhouette is at upper limits of normal for size. The visualized bony structures of the thorax are intact. IMPRESSION: Patchy airspace disease right upper lobe suspicious for pneumonia. Nodular density in the right apex is new in the interval since prior study. CT chest without contrast recommended to further evaluate. Electronically Signed   By: Misty Stanley M.D.   On: 10/24/2018 12:34   Dg Chest Port 1 View  Result Date: 10/27/2018 CLINICAL DATA:  85 year old female with shortness of breath EXAM: PORTABLE CHEST 1 VIEW COMPARISON:  Chest radiograph dated 10/24/2018 FINDINGS: Evaluation is limited due to patient positioning and superimposition of the mandible over the lung apices. Probable background of mild emphysema. Bibasilar atelectatic changes versus less likely infiltrate. Faint streaky density in the right upper lobe may correspond to the scarring seen the CT of 03/22/2015. No lobar consolidation, pleural effusion, or pneumothorax. The right costophrenic angle has been excluded from the image. The cardiac silhouette is within normal limits. Calcified mitral annulus. Loop recorder device. Degenerative changes of the spine and shoulders. Right axillary surgical clips. No acute osseous pathology. IMPRESSION: Bibasilar atelectatic changes versus less likely infiltrate. Electronically Signed   By: Anner Crete M.D.   On: 10/27/2018 01:32   Xr Clavicle Left  Result Date: 10/12/2018 2 views left shoulder shows mildly displaced lateral clavicle fracture which is nonarticular.  No other fractures identified.  The humeral head is well located.    Subjective: Eager to go home  Discharge Exam: Vitals:    10/28/18 0609 10/28/18 1116  BP: (!) 176/64 (!) 153/40  Pulse: 90 86  Resp: 17   Temp: 98.8 F (37.1 C)   SpO2: 95%    Vitals:   10/27/18 2049 10/27/18 2229 10/28/18 0609 10/28/18 1116  BP: (!) 205/84 (!) 160/55 (!) 176/64 (!) 153/40  Pulse: 85 84 90 86  Resp: 17 18 17    Temp: 98 F (36.7 C)  98.8 F (37.1 C)   TempSrc: Oral  Oral   SpO2: 97%  95%   Weight:      Height:        General: Pt is alert, awake, not in acute distress Cardiovascular: RRR, S1/S2 +, no rubs, no gallops Respiratory: CTA bilaterally, no wheezing, no rhonchi Abdominal: Soft, NT, ND, bowel sounds + Extremities: no edema, no cyanosis   The results of significant diagnostics from this hospitalization (including imaging, microbiology, ancillary and laboratory) are listed below for reference.     Microbiology: Recent Results (from the past 240 hour(s))  Urine culture     Status: Abnormal   Collection Time: 10/24/18 12:32 PM   Specimen: Urine, Clean Catch  Result Value Ref Range Status   Specimen Description   Final    URINE, CLEAN CATCH Performed at [facility], [place] [address] [place], [place]  [address]    Special Requests   Final    Normal Performed at [facility] [address] [place], [place] [address]    Culture 40,000 COLONIES/mL ENTEROCOCCUS FAECIUM (A)  Final   Report Status 10/27/2018 FINAL  Final   Organism ID, Bacteria ENTEROCOCCUS FAECIUM (A)  Final      Susceptibility   Enterococcus faecium - MIC*    AMPICILLIN <=2 SENSITIVE Sensitive     LEVOFLOXACIN 2 SENSITIVE Sensitive     NITROFURANTOIN 64 INTERMEDIATE Intermediate     VANCOMYCIN <=0.5 SENSITIVE Sensitive     * 40,000 COLONIES/mL ENTEROCOCCUS FAECIUM  SARS Coronavirus 2 [facility] order, Performed in [facility] hospital lab) Nasopharyngeal Nasopharyngeal Swab     Status: None   Collection Time: 10/24/18  1:43 PM   Specimen: Nasopharyngeal Swab  Result Value Ref Range Status   SARS  Coronavirus 2 NEGATIVE NEGATIVE Final    Comment: (NOTE) If result is NEGATIVE SARS-CoV-2 target nucleic acids are NOT DETECTED. The SARS-CoV-2 RNA is generally detectable in upper and lower  respiratory specimens during the acute phase of infection. The lowest  concentration of SARS-CoV-2 viral copies this assay can detect is 250  copies / mL. A negative result does not preclude SARS-CoV-2 infection  and should not be used as the sole basis for treatment or other  patient management decisions.  A negative result may occur with  improper specimen collection / handling, submission of specimen other  than nasopharyngeal swab, presence of viral mutation(s) within the  areas targeted by this assay, and inadequate number of viral copies  (<250 copies / mL). A negative result must be combined with clinical  observations, patient history, and epidemiological information. If result is POSITIVE SARS-CoV-2 target nucleic acids are DETECTED. The SARS-CoV-2 RNA is generally detectable in upper and lower  respiratory specimens dur ing the acute phase of infection.  Positive  results are indicative of active infection with SARS-CoV-2.  Clinical  correlation with patient history and other diagnostic information is  necessary to determine patient infection status.  Positive results do  not rule out bacterial infection or co-infection with other viruses. If result is PRESUMPTIVE POSTIVE SARS-CoV-2 nucleic acids MAY BE PRESENT.   A presumptive positive result was obtained on the submitted specimen  and confirmed on repeat testing.  While 2019 novel coronavirus  (SARS-CoV-2) nucleic acids may be present in the submitted sample  additional confirmatory testing may be necessary for epidemiological  and / or clinical management purposes  to differentiate between  SARS-CoV-2 and other Sarbecovirus currently known to infect humans.  If clinically indicated additional testing with an alternate test   methodology [phone]) is advised. The SARS-CoV-2 RNA is generally  detectable in upper and lower respiratory sp ecimens during the acute  phase of infection. The expected result is Negative. Fact Sheet for Patients:  StrictlyIdeas.no Fact Sheet for Healthcare Providers: BankingDealers.co.za This test is not yet approved or cleared by the [place] FDA and has been authorized for detection and/or diagnosis of SARS-CoV-2 by FDA under an Emergency Use Authorization (EUA).  This EUA will remain in effect (meaning this test can be used) for the duration of the COVID-19 declaration under Section 564(b)(1) of the Act, 21 U.S.C. section 360bbb-3(b)(1), unless the authorization is terminated or revoked sooner. Performed at [facility], [place] [address] [place], [place] [address]   Blood culture (routine x 2)     Status: None (Preliminary result)   Collection Time: 10/24/18  6:52 PM   Specimen: BLOOD  Result Value Ref Range Status   Specimen Description   Final    BLOOD BLOOD LEFT HAND Performed at [place] [address], [place], [place] [address]    Special Requests   Final    BOTTLES DRAWN AEROBIC ONLY Blood Culture adequate volume Performed at [facility] [address], [place], [place] [address]    Culture   Final    NO GROWTH 3 DAYS Performed at [facility], [place] [address], [place], [place] [address]    Report Status PENDING  Incomplete  Blood culture (routine x 2)     Status: None (Preliminary result)   Collection Time: 10/24/18  6:52 PM   Specimen: BLOOD  Result Value Ref Range Status   Specimen Description   Final    BLOOD LEFT ANTECUBITAL Performed at [place] [address] [place], [place] [address]    Special Requests   Final    BOTTLES DRAWN AEROBIC ONLY Blood Culture adequate volume Performed at [facility] [address] [place], [place] [address]    Culture   Final    NO GROWTH 3 DAYS Performed at [facility], [place] [address], [place], [place] [address]    Report Status PENDING  Incomplete     Labs: BNP (last 3 results) No results for input(s): BNP in the last 8760 hours. Basic Metabolic Panel: Recent Labs  Lab 10/24/18 1225 10/25/18 0551 10/26/18 0800 10/27/18 0546  NA 141 145 142 140  K 3.9 4.2 3.9 3.5  CL 99 104 107 107  CO2 30 27 27 28   GLUCOSE 186* 232* 179* 216*  BUN 97* 83* 70* 58*  CREATININE 2.66* 2.46* 2.22* 1.93*  CALCIUM 12.6* 12.2* 11.7* 11.5*   Liver Function Tests: Recent Labs  Lab 10/24/18 1225  AST 14*  ALT 13  ALKPHOS 96  BILITOT 0.8  PROT 7.1  ALBUMIN 3.8   No results for input(s): LIPASE, AMYLASE in the last 168 hours. No results for input(s): AMMONIA in the last 168 hours. CBC: Recent Labs  Lab 10/24/18 1225 10/25/18 0551 10/26/18 0800 10/27/18 0546  WBC 8.8 12.5* 7.5 11.2*  NEUTROABS 4.8  --   --   --   HGB 10.7* 10.6* 8.7* 9.2*  HCT 33.2* 32.9* 28.0* 29.8*  MCV 101.5* 101.5* 104.5* 103.5*  PLT 189 200 146* 159   Cardiac Enzymes: No results for input(s): CKTOTAL, CKMB, CKMBINDEX, TROPONINI in the last 168 hours. BNP: Invalid input(s): POCBNP CBG: Recent Labs  Lab 10/27/18 1152 10/27/18 1609 10/27/18 2055 10/28/18 0741 10/28/18 1202  GLUCAP 199* 170* 141* 186* 178*   D-Dimer No results for input(s): DDIMER in the last 72 hours. Hgb A1c No results for input(s): HGBA1C in the last 72 hours. Lipid Profile No results for input(s): CHOL, HDL, LDLCALC, TRIG, CHOLHDL, LDLDIRECT in the last 72 hours. Thyroid function studies No results for input(s): TSH, T4TOTAL, T3FREE, THYROIDAB in the last 72 hours.  Invalid input(s): FREET3 Anemia work up No results for input(s): VITAMINB12, FOLATE, FERRITIN, TIBC, IRON, RETICCTPCT in the last 72 hours. Urinalysis    Component Value Date/Time   COLORURINE YELLOW  10/24/2018 1206   APPEARANCEUR CLOUDY (A) 10/24/2018 1206   LABSPEC 1.012 10/24/2018 1206   PHURINE 5.0 10/24/2018 1206   GLUCOSEU NEGATIVE 10/24/2018 1206   HGBUR NEGATIVE 10/24/2018 [place] 10/24/2018 [place] 10/24/2018  1206   PROTEINUR 100 (A) 10/24/2018 1206   UROBILINOGEN 0.2 09/13/2013 0523   NITRITE NEGATIVE 10/24/2018 1206   LEUKOCYTESUR LARGE (A) 10/24/2018 1206   Sepsis Labs Invalid input(s): PROCALCITONIN,  WBC,  LACTICIDVEN Microbiology Recent Results (from the past 240 hour(s))  Urine culture     Status: Abnormal   Collection Time: 10/24/18 12:32 PM   Specimen: Urine, Clean Catch  Result Value Ref Range Status   Specimen Description   Final    URINE, CLEAN CATCH Performed at [facility], [place] [address] [place], [place] [address]    Special Requests   Final    Normal Performed at [facility], [place] [address], [place], [place] [address]    Culture 40,000 COLONIES/mL ENTEROCOCCUS FAECIUM (A)  Final   Report Status 10/27/2018 FINAL  Final   Organism ID, Bacteria ENTEROCOCCUS FAECIUM (A)  Final      Susceptibility   Enterococcus faecium - MIC*    AMPICILLIN <=2 SENSITIVE Sensitive     LEVOFLOXACIN 2 SENSITIVE Sensitive     NITROFURANTOIN 64 INTERMEDIATE Intermediate     VANCOMYCIN <=0.5 SENSITIVE Sensitive     * 40,000 COLONIES/mL ENTEROCOCCUS FAECIUM  SARS Coronavirus 2 [facility] order, Performed in [facility] hospital lab) Nasopharyngeal Nasopharyngeal Swab     Status: None   Collection Time: 10/24/18  1:43 PM   Specimen: Nasopharyngeal Swab  Result Value Ref Range Status   SARS Coronavirus 2 NEGATIVE NEGATIVE Final    Comment: (NOTE) If result is NEGATIVE SARS-CoV-2 target nucleic acids are NOT DETECTED. The SARS-CoV-2 RNA is generally detectable in upper and lower  respiratory specimens during the acute phase of infection. The lowest  concentration of SARS-CoV-2 viral copies  this assay can detect is 250  copies / mL. A negative result does not preclude SARS-CoV-2 infection  and should not be used as the sole basis for treatment or other  patient management decisions.  A negative result may occur with  improper specimen collection / handling, submission of specimen other  than nasopharyngeal swab, presence of viral mutation(s) within the  areas targeted by this assay, and inadequate number of viral copies  (<250 copies / mL). A negative result must be combined with clinical  observations, patient history, and epidemiological information. If result is POSITIVE SARS-CoV-2 target nucleic acids are DETECTED. The SARS-CoV-2 RNA is generally detectable in upper and lower  respiratory specimens dur ing the acute phase of infection.  Positive  results are indicative of active infection with SARS-CoV-2.  Clinical  correlation with patient history and other diagnostic information is  necessary to determine patient infection status.  Positive results do  not rule out bacterial infection or co-infection with other viruses. If result is PRESUMPTIVE POSTIVE SARS-CoV-2 nucleic acids MAY BE PRESENT.   A presumptive positive result was obtained on the submitted specimen  and confirmed on repeat testing.  While 2019 novel coronavirus  (SARS-CoV-2) nucleic acids may be present in the submitted sample  additional confirmatory testing may be necessary for epidemiological  and / or clinical management purposes  to differentiate between  SARS-CoV-2 and other Sarbecovirus currently known to infect humans.  If clinically indicated additional testing with an alternate test  methodology [phone]) is advised. The SARS-CoV-2 RNA is generally  detectable in upper and lower respiratory sp ecimens during the acute  phase of infection. The expected result is Negative. Fact Sheet for Patients:  StrictlyIdeas.no Fact Sheet for Healthcare  Providers: BankingDealers.co.za This test is  not yet approved or cleared by the [place] and has been authorized for detection and/or diagnosis of SARS-CoV-2 by FDA under an Emergency Use Authorization (EUA).  This EUA will remain in effect (meaning this test can be used) for the duration of the COVID-19 declaration under Section 564(b)(1) of the Act, 21 U.S.C. section 360bbb-3(b)(1), unless the authorization is terminated or revoked sooner. Performed at [facility], [place] [address] [place], [place] [address]   Blood culture (routine x 2)     Status: None (Preliminary result)   Collection Time: 10/24/18  6:52 PM   Specimen: BLOOD  Result Value Ref Range Status   Specimen Description   Final    BLOOD BLOOD LEFT HAND Performed at [facility] [address], [place], [place] [address]    Special Requests   Final    BOTTLES DRAWN AEROBIC ONLY Blood Culture adequate volume Performed at [place] [address], [place], [place] [address]    Culture   Final    NO GROWTH 3 DAYS Performed at [facility], [place] [address], [place], [place] [address]    Report Status PENDING  Incomplete  Blood culture (routine x 2)     Status: None (Preliminary result)   Collection Time: 10/24/18  6:52 PM   Specimen: BLOOD  Result Value Ref Range Status   Specimen Description   Final    BLOOD LEFT ANTECUBITAL Performed at [place] [address], [place], [place] [address]    Special Requests   Final    BOTTLES DRAWN AEROBIC ONLY Blood Culture adequate volume Performed at [place] [address], [place], [place] [address]    Culture   Final    NO GROWTH 3 DAYS Performed at [facility], [place] [address], [place], [place] [address]    Report Status PENDING  Incomplete   Time spent: 30 min  SIGNED:   Marylu Lund, MD  [facility] 10/28/2018, 12:22 PM  If 7PM-7AM, please contact night-coverage

## 2018-10-28 NOTE — Discharge Instructions (Signed)
Resume lasix on 8/23 (Friday)

## 2018-10-28 NOTE — Progress Notes (Signed)
Physical Therapy Treatment Patient Details Name: Sabrina [name] MRN: [id number] DOB: 18-Feb-1927 Today's Date: 10/28/2018    History of Present Illness 85 y.o. female with past medical history of hypertension, hyperlipidemia, T2DM, stroke, DVT/PE, CKD3, right breast cancer, recent L clavicle fx admitted through ED 10/24/18  from home with generalized weakness and chills-->UTI    PT Comments    Pt apprehensive about pending d/c.  She does have 24 hour aides for her and her husband, but pt continues to have weakness and had episode of LE buckeling with transfer to the [facility] today. Instructed to pt that she will need to make sure her aides understand she needs more assistance than she did just a few weeks ago.  She is a high fall risk.  Recommend continuing with HHPT.   Follow Up Recommendations  Home health PT;Supervision/Assistance - 24 hour     Equipment Recommendations  None recommended by PT    Recommendations for Other Services       Precautions / Restrictions Precautions Precautions: Fall Restrictions Weight Bearing Restrictions: No    Mobility  Bed Mobility Overal bed mobility: Needs Assistance Bed Mobility: Supine to Sit;Sit to Supine     Supine to sit: Mod assist Sit to supine: Mod assist   General bed mobility comments: Pt required MOD A to get legs and trunk to EOB and then again to return supine. She normally sleeps in a chair.  She needed MOD A to re-position.  Transfers Overall transfer level: Needs assistance Equipment used: Rolling walker (2 wheeled) Transfers: Sit to/from [facility] Sit to Stand: Min assist Stand pivot transfers: Mod assist       General transfer comment: Cues to weight shift forward.  one episode of LE buckeling during SPT.  Ambulation/Gait                 Stairs             Wheelchair Mobility    Modified Rankin (Stroke Patients Only)       Balance                                             Cognition Arousal/Alertness: Awake/alert Behavior During Therapy: WFL for tasks assessed/performed Overall Cognitive Status: Within Functional Limits for tasks assessed                                        Exercises      General Comments General comments (skin integrity, edema, etc.): Pt transferred supine > sit > SPT to BSC> Bed >supine.  She needed A for hygiene with flexed posture.      Pertinent Vitals/Pain      Home Living                      Prior Function            PT Goals (current goals can now be found in the care plan section) Progress towards PT goals: Not progressing toward goals - comment(buckeling of LE)    Frequency    Min 3X/week      PT Plan Current plan remains appropriate    Co-evaluation              AM-PAC PT "6  Clicks" Mobility   Outcome Measure  Help needed turning from your back to your side while in a flat bed without using bedrails?: A Lot Help needed moving from lying on your back to sitting on the side of a flat bed without using bedrails?: A Lot Help needed moving to and from a bed to a chair (including a wheelchair)?: A Lot Help needed standing up from a chair using your arms (e.g., wheelchair or bedside chair)?: A Little Help needed to walk in hospital room?: A Lot Help needed climbing 3-5 steps with a railing? : A Lot 6 Click Score: 13    End of Session Equipment Utilized During Treatment: Gait belt Activity Tolerance: Patient limited by fatigue Patient left: in bed;with call bell/phone within reach;with bed alarm set Nurse Communication: Mobility status PT Visit Diagnosis: Difficulty in walking, not elsewhere classified (R26.2);Muscle weakness (generalized) (M62.81)     Time: 6546-5035 PT Time Calculation (min) (ACUTE ONLY): 31 min  Charges:  $Therapeutic Activity: 23-37 mins                     Idalia Allbritton L. Tamala Julian, [place] Pager [phone] 10/28/2018    Galen Manila 10/28/2018, 2:44 PM

## 2018-10-28 NOTE — Plan of Care (Signed)
  Problem: Activity: Goal: Risk for activity intolerance will decrease Outcome: Progressing   Problem: Nutrition: Goal: Adequate nutrition will be maintained Outcome: Progressing   Problem: Coping: Goal: Level of anxiety will decrease Outcome: Progressing   Problem: Pain Managment: Goal: General experience of comfort will improve Outcome: Progressing   Problem: Safety: Goal: Ability to remain free from injury will improve Outcome: Progressing   

## 2018-10-28 NOTE — TOC Transition Note (Addendum)
Transition of Care [facility]) - CM/SW Discharge Note   Patient Details  Name: Karyss Frese MRN: [id number] Date of Birth: 08/11/1926  [facility]) CM/SW Contact:  Lia Hopping, LCSW Phone Number: 10/28/2018, 12:37 PM   Clinical Narrative:    CSW notified [facility] Rep. Santiago Glad patient will discharge today.   CSW arranged PTAR transportation home per patient request. Patient reports "[facility]" will not be able to transport her today.  Patient understands it may or not be covered by her insurance.    Final next level of care: [place] Barriers to Discharge: No Barriers Identified   Patient Goals and CMS Choice Patient states their goals for this hospitalization and ongoing recovery are:: Return Home CMS Medicare.gov Compare Post Acute Care list provided to:: Patient Choice offered to / list presented to : Patient  Discharge Placement  Home                     Discharge Plan and Services In-house Referral: Clinical Social Work   Post Acute Care Choice: Home Health                    HH Arranged: PT, OT, RN [facility] Agency: [place] ([facility]) Date HH Agency Contacted: 10/26/18 Time [place]: 1303 Representative spoke with at [place]: [place] ([place]) Interventions     Readmission Risk Interventions No flowsheet data found.

## 2018-10-29 ENCOUNTER — Telehealth: Payer: Self-pay

## 2018-10-29 NOTE — Telephone Encounter (Signed)
Spoke to pt regarding alerts and increased heart rates. Pt states she just got out of the hospital. Requested manual transmission to view all episodes. Will route histograms to Dr. Lovena Le for review. AT/AF burden is 0.1%. pt has appt w/ AF clinic on 9/3.

## 2018-10-30 LAB — CULTURE, BLOOD (ROUTINE X 2)
Culture: NO GROWTH
Culture: NO GROWTH
Special Requests: ADEQUATE
Special Requests: ADEQUATE

## 2018-11-04 ENCOUNTER — Observation Stay (HOSPITAL_COMMUNITY)
Admission: EM | Admit: 2018-11-04 | Discharge: 2018-11-06 | Disposition: A | Discharge status: Home / Self Care | Payer: Medicare Other | Attending: Family Medicine | Admitting: Family Medicine

## 2018-11-04 ENCOUNTER — Other Ambulatory Visit: Payer: Self-pay

## 2018-11-04 DIAGNOSIS — R2243 Localized swelling, mass and lump, lower limb, bilateral: Secondary | ICD-10-CM | POA: Diagnosis not present

## 2018-11-04 DIAGNOSIS — M4696 Unspecified inflammatory spondylopathy, lumbar region: Secondary | ICD-10-CM | POA: Diagnosis not present

## 2018-11-04 DIAGNOSIS — M19042 Primary osteoarthritis, left hand: Secondary | ICD-10-CM | POA: Insufficient documentation

## 2018-11-04 DIAGNOSIS — M109 Gout, unspecified: Secondary | ICD-10-CM | POA: Insufficient documentation

## 2018-11-04 DIAGNOSIS — Z853 Personal history of malignant neoplasm of breast: Secondary | ICD-10-CM | POA: Insufficient documentation

## 2018-11-04 DIAGNOSIS — R251 Tremor, unspecified: Secondary | ICD-10-CM | POA: Diagnosis not present

## 2018-11-04 DIAGNOSIS — E119 Type 2 diabetes mellitus without complications: Secondary | ICD-10-CM

## 2018-11-04 DIAGNOSIS — M19041 Primary osteoarthritis, right hand: Secondary | ICD-10-CM | POA: Diagnosis not present

## 2018-11-04 DIAGNOSIS — Z7901 Long term (current) use of anticoagulants: Secondary | ICD-10-CM | POA: Insufficient documentation

## 2018-11-04 DIAGNOSIS — R5383 Other fatigue: Secondary | ICD-10-CM | POA: Diagnosis not present

## 2018-11-04 DIAGNOSIS — Z66 Do not resuscitate: Secondary | ICD-10-CM | POA: Insufficient documentation

## 2018-11-04 DIAGNOSIS — I10 Essential (primary) hypertension: Secondary | ICD-10-CM | POA: Diagnosis present

## 2018-11-04 DIAGNOSIS — Z79899 Other long term (current) drug therapy: Secondary | ICD-10-CM | POA: Diagnosis not present

## 2018-11-04 DIAGNOSIS — Z20828 Contact with and (suspected) exposure to other viral communicable diseases: Secondary | ICD-10-CM | POA: Insufficient documentation

## 2018-11-04 DIAGNOSIS — Z9221 Personal history of antineoplastic chemotherapy: Secondary | ICD-10-CM | POA: Insufficient documentation

## 2018-11-04 DIAGNOSIS — E7800 Pure hypercholesterolemia, unspecified: Secondary | ICD-10-CM | POA: Diagnosis not present

## 2018-11-04 DIAGNOSIS — H268 Other specified cataract: Secondary | ICD-10-CM | POA: Diagnosis not present

## 2018-11-04 DIAGNOSIS — R531 Weakness: Secondary | ICD-10-CM | POA: Diagnosis not present

## 2018-11-04 DIAGNOSIS — N179 Acute kidney failure, unspecified: Secondary | ICD-10-CM | POA: Diagnosis present

## 2018-11-04 DIAGNOSIS — M8580 Other specified disorders of bone density and structure, unspecified site: Secondary | ICD-10-CM | POA: Insufficient documentation

## 2018-11-04 DIAGNOSIS — Z803 Family history of malignant neoplasm of breast: Secondary | ICD-10-CM | POA: Insufficient documentation

## 2018-11-04 DIAGNOSIS — Z7984 Long term (current) use of oral hypoglycemic drugs: Secondary | ICD-10-CM | POA: Insufficient documentation

## 2018-11-04 DIAGNOSIS — Z85828 Personal history of other malignant neoplasm of skin: Secondary | ICD-10-CM | POA: Insufficient documentation

## 2018-11-04 DIAGNOSIS — E1136 Type 2 diabetes mellitus with diabetic cataract: Secondary | ICD-10-CM | POA: Insufficient documentation

## 2018-11-04 DIAGNOSIS — Z86711 Personal history of pulmonary embolism: Secondary | ICD-10-CM | POA: Diagnosis present

## 2018-11-04 DIAGNOSIS — E1165 Type 2 diabetes mellitus with hyperglycemia: Secondary | ICD-10-CM | POA: Diagnosis not present

## 2018-11-04 DIAGNOSIS — Z888 Allergy status to other drugs, medicaments and biological substances status: Secondary | ICD-10-CM | POA: Insufficient documentation

## 2018-11-04 DIAGNOSIS — I5032 Chronic diastolic (congestive) heart failure: Secondary | ICD-10-CM | POA: Diagnosis present

## 2018-11-04 DIAGNOSIS — R269 Unspecified abnormalities of gait and mobility: Secondary | ICD-10-CM | POA: Diagnosis not present

## 2018-11-04 DIAGNOSIS — Z86718 Personal history of other venous thrombosis and embolism: Secondary | ICD-10-CM | POA: Insufficient documentation

## 2018-11-04 DIAGNOSIS — K5900 Constipation, unspecified: Secondary | ICD-10-CM | POA: Diagnosis not present

## 2018-11-04 DIAGNOSIS — R69 Illness, unspecified: Secondary | ICD-10-CM

## 2018-11-04 DIAGNOSIS — I130 Hypertensive heart and chronic kidney disease with heart failure and stage 1 through stage 4 chronic kidney disease, or unspecified chronic kidney disease: Secondary | ICD-10-CM | POA: Diagnosis not present

## 2018-11-04 DIAGNOSIS — I480 Paroxysmal atrial fibrillation: Secondary | ICD-10-CM | POA: Diagnosis not present

## 2018-11-04 DIAGNOSIS — E1122 Type 2 diabetes mellitus with diabetic chronic kidney disease: Secondary | ICD-10-CM | POA: Diagnosis not present

## 2018-11-04 DIAGNOSIS — N184 Chronic kidney disease, stage 4 (severe): Secondary | ICD-10-CM | POA: Diagnosis present

## 2018-11-04 DIAGNOSIS — E1159 Type 2 diabetes mellitus with other circulatory complications: Secondary | ICD-10-CM | POA: Diagnosis present

## 2018-11-04 DIAGNOSIS — F0390 Unspecified dementia without behavioral disturbance: Secondary | ICD-10-CM | POA: Diagnosis not present

## 2018-11-04 DIAGNOSIS — Z923 Personal history of irradiation: Secondary | ICD-10-CM | POA: Insufficient documentation

## 2018-11-04 DIAGNOSIS — Z8673 Personal history of transient ischemic attack (TIA), and cerebral infarction without residual deficits: Secondary | ICD-10-CM

## 2018-11-04 DIAGNOSIS — R5381 Other malaise: Secondary | ICD-10-CM | POA: Diagnosis not present

## 2018-11-04 DIAGNOSIS — I129 Hypertensive chronic kidney disease with stage 1 through stage 4 chronic kidney disease, or unspecified chronic kidney disease: Secondary | ICD-10-CM | POA: Diagnosis not present

## 2018-11-04 DIAGNOSIS — Z8249 Family history of ischemic heart disease and other diseases of the circulatory system: Secondary | ICD-10-CM | POA: Insufficient documentation

## 2018-11-04 DIAGNOSIS — E1121 Type 2 diabetes mellitus with diabetic nephropathy: Secondary | ICD-10-CM | POA: Diagnosis not present

## 2018-11-04 LAB — COMPREHENSIVE METABOLIC PANEL
ALT: 15 U/L (ref 0–44)
AST: 14 U/L — ABNORMAL LOW (ref 15–41)
Albumin: 3.5 g/dL (ref 3.5–5.0)
Alkaline Phosphatase: 88 U/L (ref 38–126)
Anion gap: 10 (ref 5–15)
BUN: 57 mg/dL — ABNORMAL HIGH (ref 8–23)
CO2: 32 mmol/L (ref 22–32)
Calcium: 13 mg/dL — ABNORMAL HIGH (ref 8.9–10.3)
Chloride: 101 mmol/L (ref 98–111)
Creatinine, Ser: 2.02 mg/dL — ABNORMAL HIGH (ref 0.44–1.00)
GFR calc Af Amer: 25 mL/min — ABNORMAL LOW (ref >60)
GFR calc non Af Amer: 21 mL/min — ABNORMAL LOW (ref >60)
Glucose, Bld: 200 mg/dL — ABNORMAL HIGH (ref 70–99)
Potassium: 4.1 mmol/L (ref 3.5–5.1)
Sodium: 143 mmol/L (ref 135–145)
Total Bilirubin: 0.3 mg/dL (ref 0.3–1.2)
Total Protein: 6.6 g/dL (ref 6.5–8.1)

## 2018-11-04 LAB — CBC WITH DIFFERENTIAL/PLATELET
Abs Immature Granulocytes: 0.03 10*3/uL (ref 0.00–0.07)
Basophils Absolute: 0 10*3/uL (ref 0.0–0.1)
Basophils Relative: 0 %
Eosinophils Absolute: 0.3 10*3/uL (ref 0.0–0.5)
Eosinophils Relative: 4 %
HCT: 32.7 % — ABNORMAL LOW (ref 36.0–46.0)
Hemoglobin: 10.3 g/dL — ABNORMAL LOW (ref 12.0–15.0)
Immature Granulocytes: 0 %
Lymphocytes Relative: 29 %
Lymphs Abs: 2.3 10*3/uL (ref 0.7–4.0)
MCH: 32.5 pg (ref 26.0–34.0)
MCHC: 31.5 g/dL (ref 30.0–36.0)
MCV: 103.2 fL — ABNORMAL HIGH (ref 80.0–100.0)
Monocytes Absolute: 0.6 10*3/uL (ref 0.1–1.0)
Monocytes Relative: 7 %
Neutro Abs: 4.7 10*3/uL (ref 1.7–7.7)
Neutrophils Relative %: 60 %
Platelets: 218 10*3/uL (ref 150–400)
RBC: 3.17 MIL/uL — ABNORMAL LOW (ref 3.87–5.11)
RDW: 14.2 % (ref 11.5–15.5)
WBC: 7.9 10*3/uL (ref 4.0–10.5)
nRBC: 0 % (ref 0.0–0.2)

## 2018-11-04 LAB — URINALYSIS, ROUTINE W REFLEX MICROSCOPIC
Bilirubin Urine: NEGATIVE
Glucose, UA: 50 mg/dL — AB
Hgb urine dipstick: NEGATIVE
Ketones, ur: NEGATIVE mg/dL
Leukocytes,Ua: NEGATIVE
Nitrite: NEGATIVE
Protein, ur: 30 mg/dL — AB
Specific Gravity, Urine: 1.006 (ref 1.005–1.030)
pH: 7 (ref 5.0–8.0)

## 2018-11-04 LAB — MAGNESIUM: Magnesium: 2.3 mg/dL (ref 1.7–2.4)

## 2018-11-04 LAB — PHOSPHORUS: Phosphorus: 4.8 mg/dL — ABNORMAL HIGH (ref 2.5–4.6)

## 2018-11-04 LAB — TSH: TSH: 1.27 u[IU]/mL (ref 0.350–4.500)

## 2018-11-04 MED ORDER — SODIUM CHLORIDE 0.9 % IV BOLUS: 500.0000 mL | Freq: Once | INTRAVENOUS | Status: DC

## 2018-11-04 MED ORDER — SODIUM CHLORIDE 0.9 % IV SOLN: Freq: Once | INTRAVENOUS | Status: DC

## 2018-11-04 NOTE — H&P (Addendum)
History and Physical    [address] Sabrina [name] MOQ:[id number] DOB: April 09, 1926 DOA: 11/04/2018  PCP: [name], MD  Patient coming from: PCP office  I have personally briefly reviewed patient's old medical records in [place]  Chief Complaint: Hypercalcemia  HPI: Sabrina [name] is a 85 y.o. female with medical history significant for type 2 diabetes, hypertension, hyperlipidemia, paroxysmal atrial fibrillation on Eliquis, history of CVA, CKD stage III, chronic diastolic CHF, and right-sided breast cancer who presents to the ED for evaluation of hypercalcemia.  Patient recently admitted from 10/24/2018-10/28/2018 for acute encephalopathy secondary to enterococcus faecium UTI.  She was treated with ceftriaxone and discharged on Augmentin with home health PT.  Patient states she normally uses a wheelchair for transport and occasionally a walker to ambulate short distances.  She has been having generalized weakness and constipation.  She reports tremors in her hands which are chronic.  She has chronic swelling in both of her legs.  She denies any abdominal pain, dysuria, or diarrhea.  She denies any subjective fevers or diaphoresis.  She reports good oral intake without nausea or vomiting.  She was seen at her PCP office and labs showed elevated calcium and she was subsequently sent to the ED for further evaluation.  ED Course:  Initial vitals showed BP 175/53, pulse 67, RR 15, temp 98.1 Fahrenheit, SPO2 97% on room air.  Labs notable for calcium 13.0, albumin 3.5, BUN 57, creatinine 2.02, potassium 4.1, sodium 143, bicarb 32, WBC 7.9, hemoglobin 10.3, platelets 218,000, magnesium 2.3, TSH 1.27, phosphorus 4.8.  Hospitalist service was consulted for further evaluation and management of hypercalcemia.  Review of Systems: All systems reviewed and are negative except as documented in history of present illness above.   Past Medical History:  Diagnosis Date   Arthritis    "mostly in my  hands, lower back" (06/25/2017)   Basal cell carcinoma (BCC) of face 1983   Breast cancer, right breast ([place]) 03/2013   Chronic kidney disease (CKD), stage III (moderate) ([place])    nephrologist, Dr. [name]   Dental crowns present    DVT (deep venous thrombosis) ([place]) ~ 06/2013   "? side"   Gout    "on daily RX" (06/25/2017)   Heart murmur    no known problems; states did not know she had murmur until age 60   High cholesterol    Hypertension    fluctuates, especially when stressed; has been on med. > 20 yr.   Immature cataract    Non-insulin dependent type 2 diabetes mellitus ([place])    Personal history of chemotherapy    Personal history of radiation therapy    Pulmonary embolism ([place]) ~ 06/2013   Radiation 09/06/13-10/20/13   Right Breast Cancer   Stroke ([place]) 05/2017   just visual problems since (06/25/2017)   Wears partial dentures    lower    Past Surgical History:  Procedure Laterality Date   AXILLARY LYMPH NODE DISSECTION Right 03/30/2013   Procedure: AXILLARY LYMPH NODE DISSECTION;  Surgeon: Rolm Bookbinder, MD;  Location: [place];  Service: General;  Laterality: Right;   BASAL CELL CARCINOMA EXCISION  1983   "face"   BREAST BIOPSY Right 03/17/2013   BREAST CYST EXCISION Right 11/1958   benign   BREAST LUMPECTOMY Right 03/30/2013   BREAST LUMPECTOMY WITH NEEDLE LOCALIZATION Right 03/30/2013   Procedure: BREAST LUMPECTOMY WITH NEEDLE LOCALIZATION;  Surgeon: Rolm Bookbinder, MD;  Location: [place];  Service: General;  Laterality: Right;   DILATION  AND CURETTAGE OF UTERUS     LOOP RECORDER INSERTION N/A 08/15/2017   Procedure: LOOP RECORDER INSERTION;  Surgeon: Evans Lance, MD;  Location: [facility];  Service: Cardiovascular;  Laterality: N/A;   PORT-A-CATH REMOVAL  2016   PORTACATH PLACEMENT N/A 04/15/2013   Procedure: INSERTION PORT-A-CATH;  Surgeon: Rolm Bookbinder, MD;  Location: [place];  Service: General;   Laterality: N/A;   RE-EXCISION OF BREAST CANCER,SUPERIOR MARGINS Right 04/15/2013   Procedure: RE-EXCISION OF RIGHT BREAST  MARGINS;  Surgeon: Rolm Bookbinder, MD;  Location: [place];  Service: General;  Laterality: Right;   TONSILLECTOMY  ~ 1935/1936    Social History:  reports that she has never smoked. She has never used smokeless tobacco. She reports current alcohol use of about 3.0 standard drinks of alcohol per week. She reports that she does not use drugs.  Allergies  Allergen Reactions   Beta Adrenergic Blockers     Family History  Problem Relation Age of Onset   Pneumonia Mother    Heart attack Father    Breast cancer Other 75       niece   Breast cancer Other 52       niece   Ovarian cancer Other        niece     Prior to Admission medications   Medication Sig Start Date End Date Taking? Authorizing Provider  allopurinol (ZYLOPRIM) 100 MG tablet Take 100 mg by mouth daily.  01/17/16  Yes [provider]  apixaban (ELIQUIS) 2.5 MG TABS tablet Take 1 tablet (2.5 mg total) by mouth 2 (two) times daily. 10/07/18  Yes Fenton, Clint R, PA  cholecalciferol (VITAMIN D) 1000 UNITS tablet Take 1,000 Units by mouth daily.   Yes [provider]  cloNIDine (CATAPRES) 0.2 MG tablet Take 0.2 mg by mouth 2 (two) times daily.   Yes [provider]  denosumab (PROLIA) 60 MG/ML SOSY injection Inject 60 mg into the skin every 6 (six) months.   Yes [provider]  furosemide (LASIX) 40 MG tablet Take 40 mg by mouth daily.   Yes [provider]  gabapentin (NEURONTIN) 100 MG capsule Take 200-300 mg by mouth See admin instructions. Take 200 mg by mouth twice a day and take 300 mg by mouth at bedtime   Yes [provider]  glipiZIDE (GLUCOTROL) 5 MG tablet Take 5 mg by mouth 2 (two) times daily before a meal.    Yes [provider]  hydrALAZINE (APRESOLINE) 10 MG tablet Take 1 tablet (10 mg total) by mouth  3 (three) times daily. 09/29/18 12/28/18 Yes Hosie Poisson, MD  hydrocortisone cream 1 % Apply 1 application topically daily as needed for itching.   Yes [provider]  polyethylene glycol (MIRALAX / GLYCOLAX) packet Take 17 g by mouth daily.    Yes [provider]  traMADol (ULTRAM) 50 MG tablet Take 50 mg by mouth 2 (two) times daily as needed for moderate pain.    Yes [provider]    Physical Exam: Vitals:   11/04/18 2059 11/04/18 2100 11/04/18 2130 11/04/18 2200  BP:  (!) 184/46 (!) 183/61 (!) 188/73  Pulse: 64  (!) 58 67  Resp: (!) 21 19 19 18   Temp:      TempSrc:      SpO2: 100%  100% (!) 85%    Constitutional: Elderly woman resting in bed with head elevated, NAD, calm, comfortable Eyes: PERRL, lids and  conjunctivae normal ENMT: Mucous membranes are moist. Posterior pharynx clear of any exudate or lesions.Normal dentition.  Neck: normal, supple, no masses. Respiratory: clear to auscultation bilaterally, no wheezing, no crackles. Normal respiratory effort. No accessory muscle use.  Cardiovascular: Regular rate and rhythm, systolic murmur present.  +1 bilateral lower extremity edema. 2+ pedal pulses. Abdomen: Obese abdomen, no tenderness, no masses palpated. No hepatosplenomegaly. Bowel sounds positive.  Musculoskeletal: no clubbing / cyanosis. No joint deformity upper and lower extremities. Good ROM, no contractures. Normal muscle tone.  Skin: no rashes, lesions, ulcers. No induration Neurologic: CN 2-12 grossly intact. Sensation intact, Strength 5/5 in all 4.  Resting tremor of hands. Psychiatric: Normal judgment and insight. Alert and oriented x 3. Normal mood.    Labs on Admission: I have personally reviewed following labs and imaging studies  CBC: Recent Labs  Lab 11/04/18 2021  WBC 7.9  NEUTROABS 4.7  HGB 10.3*  HCT 32.7*  MCV 103.2*  PLT 226   Basic Metabolic Panel: Recent Labs  Lab 11/04/18 2021  NA 143  K 4.1  CL 101  CO2  32  GLUCOSE 200*  BUN 57*  CREATININE 2.02*  CALCIUM 13.0*  MG 2.3  PHOS 4.8*   GFR: Estimated Creatinine Clearance: 17 mL/min (A) (by C-G formula based on SCr of 2.02 mg/dL (H)). Liver Function Tests: Recent Labs  Lab 11/04/18 2021  AST 14*  ALT 15  ALKPHOS 88  BILITOT 0.3  PROT 6.6  ALBUMIN 3.5   No results for input(s): LIPASE, AMYLASE in the last 168 hours. No results for input(s): AMMONIA in the last 168 hours. Coagulation Profile: No results for input(s): INR, PROTIME in the last 168 hours. Cardiac Enzymes: No results for input(s): CKTOTAL, CKMB, CKMBINDEX, TROPONINI in the last 168 hours. BNP (last 3 results) No results for input(s): PROBNP in the last 8760 hours. HbA1C: No results for input(s): HGBA1C in the last 72 hours. CBG: No results for input(s): GLUCAP in the last 168 hours. Lipid Profile: No results for input(s): CHOL, HDL, LDLCALC, TRIG, CHOLHDL, LDLDIRECT in the last 72 hours. Thyroid Function Tests: Recent Labs    11/04/18 2021  TSH 1.270   Anemia Panel: No results for input(s): VITAMINB12, FOLATE, FERRITIN, TIBC, IRON, RETICCTPCT in the last 72 hours. Urine analysis:    Component Value Date/Time   COLORURINE STRAW (A) 11/04/2018 2234   APPEARANCEUR CLEAR 11/04/2018 2234   LABSPEC 1.006 11/04/2018 2234   PHURINE 7.0 11/04/2018 2234   GLUCOSEU 50 (A) 11/04/2018 2234   HGBUR NEGATIVE 11/04/2018 2234   BILIRUBINUR NEGATIVE 11/04/2018 2234   KETONESUR NEGATIVE 11/04/2018 2234   PROTEINUR 30 (A) 11/04/2018 2234   UROBILINOGEN 0.2 09/13/2013 0523   NITRITE NEGATIVE 11/04/2018 2234   LEUKOCYTESUR NEGATIVE 11/04/2018 2234    Radiological Exams on Admission: No results found.  EKG: Not performed.  Assessment/Plan Principal Problem:   Hypercalcemia Active Problems:   History of pulmonary embolism   Chronic diastolic congestive heart failure (HCC)   CKD (chronic kidney disease), stage IV (HCC)   DM (diabetes mellitus) ([place])    Hypertension   History of stroke   AF (paroxysmal atrial fibrillation) ([place])  Sabrina [name] is a 85 y.o. female with medical history significant for type 2 diabetes, hypertension, hyperlipidemia, paroxysmal atrial fibrillation on Eliquis, history of CVA, CKD stage III, chronic diastolic CHF, and right-sided breast cancer who is admitted with hypercalcemia.  Hypercalcemia: Recently elevated calcium with increased corrected calcium of 13.4 this admission.  Previously on  denosumab for management of osteopenia however has not received her last administration due to 3 months ago due to ongoing COVID-19 pandemic. -Continue gentle IV fluid resuscitation in setting of CHF, resume home Lasix in a.m. -Give calcitonin -Will not give zoledronic acid given renal function, can consider denosumab -Repeat labs in a.m.  Chronic diastolic CHF: EF 38-32%, N1BT by TTE 05/27/2017. -Resume Lasix in a.m., monitor volume status closely with IV fluids -Strict I/O's, daily weights  Paroxysmal atrial fibrillation: In sinus rhythm on admission with rate controlled.  Continue Eliquis.  Type 2 diabetes: Hold home glipizide, start sensitive SSI.  CKD stage III/IV: Appears at recent baseline.  Continue to monitor.  History of occipital stroke: Continue Eliquis.  Hypertension: Hypertensive on admission.  Resume home clonidine and hydralazine.  Right breast cancer s/p lumpectomy/adjuvant chemo/immunotherapy: In remission per oncology documentation.   DVT prophylaxis: Eliquis Code Status: DNR, confirmed with patient. Family Communication: Discussed with patient, patient discussed with sister Disposition Plan: Pending clinical progress Consults called: None Admission status: Observation   Zada Finders MD [facility]  If 7PM-7AM, please contact night-coverage [email or web address]  11/05/2018, 12:27 AM

## 2018-11-04 NOTE — ED Notes (Signed)
Pt said she would like something to drink. Messaged provider.

## 2018-11-04 NOTE — ED Triage Notes (Signed)
Pt BIBA from home.   Per EMS-  Pt was at PCP today- who told pt to come to ED d/t hypercalcemia.  Pt AOx4, arrived with wheelchair.

## 2018-11-04 NOTE — ED Provider Notes (Signed)
[facility] DEPT Provider Note   CSN: [id number] Arrival date & time: 11/04/18  1722     History   Chief Complaint No chief complaint on file.   HPI Sabrina [name] is a 85 y.o. female.     HPI Patient was hospitalized at the end of July.  She reports that since that time she has been somewhat weak but generally attending to her basic care at home.  She reports her husband has dementia so she helps care for him but they also have 24-hour in-home help.  If she reports that she gets around with her wheelchair and very short distances with her walker.  She reports that she stays fairly tremulous.  She reports she has been chronically short of breath but not significantly worse than her baseline.  She reports she is getting some swelling in her legs and her feet.  She had a follow-up appointment with Dr. Felipa Eth today.  That was at 11 AM.  She reports lab work was done and she was contacted about 4 PM and told that she needed to come emergently to the emergency department for her lab abnormalities. Past Medical History:  Diagnosis Date  . Arthritis    "mostly in my hands, lower back" (06/25/2017)  . Basal cell carcinoma (BCC) of face 1983  . Breast cancer, right breast ([place]) 03/2013  . Chronic kidney disease (CKD), stage III (moderate) (HCC)    nephrologist, Dr. [name]  . Dental crowns present   . DVT (deep venous thrombosis) ([place]) ~ 06/2013   "? side"  . Gout    "on daily RX" (06/25/2017)  . Heart murmur    no known problems; states did not know she had murmur until age 89  . High cholesterol   . Hypertension    fluctuates, especially when stressed; has been on med. > 20 yr.  . Immature cataract   . Non-insulin dependent type 2 diabetes mellitus ([place])   . Personal history of chemotherapy   . Personal history of radiation therapy   . Pulmonary embolism ([place]) ~ 06/2013  . Radiation 09/06/13-10/20/13   Right Breast Cancer  . Stroke ([place])  05/2017   just visual problems since (06/25/2017)  . Wears partial dentures    lower    Patient Active Problem List   Diagnosis Date Noted  . Acute kidney injury ([place])   . Stage IV breast cancer in female [facility])   . Acute metabolic encephalopathy 17/51/0258  . Personal history of DVT and pulm embolus 10/26/2018  . UTI (urinary tract infection) 10/24/2018  . Clavicle fracture 09/26/2018  . Blurry vision, bilateral   . Acute blood loss anemia   . History of breast cancer 05/30/2017  . Physical debility 05/30/2017  . Occipital infarction ([place]) 05/30/2017  . Pressure injury of skin 05/28/2017  . Fall at home 05/27/2017  . CKD (chronic kidney disease), stage IV ([place]) 05/27/2017  . High cholesterol 05/27/2017  . DM (diabetes mellitus) ([place]) 05/27/2017  . Hypertension 05/27/2017  . Gout 05/27/2017  . Elevated troponin 05/27/2017  . Lipoma of lower extremity 09/12/2014  . Abnormal x-ray 03/15/2014  . Chronic diastolic congestive heart failure ([place]) 02/23/2014  . Osteopenia 11/30/2013  . Hypoglycemia 09/13/2013  . Acute respiratory failure with hypoxia ([place]) 06/14/2013  . History of pulmonary embolism 06/10/2013  . Nausea and vomiting 06/10/2013  . Accelerated hypertension 06/10/2013  . Aortic stenosis 04/22/2013  . Edema leg 04/22/2013  . Breast cancer of upper-outer  quadrant of right female breast ([place]); in remission 03/19/2013    Past Surgical History:  Procedure Laterality Date  . AXILLARY LYMPH NODE DISSECTION Right 03/30/2013   Procedure: AXILLARY LYMPH NODE DISSECTION;  Surgeon: Rolm Bookbinder, MD;  Location: [facility];  Service: General;  Laterality: Right;  . BASAL CELL CARCINOMA EXCISION  1983   "face"  . BREAST BIOPSY Right 03/17/2013  . BREAST CYST EXCISION Right 11/1958   benign  . BREAST LUMPECTOMY Right 03/30/2013  . BREAST LUMPECTOMY WITH NEEDLE LOCALIZATION Right 03/30/2013   Procedure: BREAST LUMPECTOMY WITH NEEDLE LOCALIZATION;  Surgeon: Rolm Bookbinder, MD;   Location: [facility];  Service: General;  Laterality: Right;  . DILATION AND CURETTAGE OF UTERUS    . LOOP RECORDER INSERTION N/A 08/15/2017   Procedure: LOOP RECORDER INSERTION;  Surgeon: Evans Lance, MD;  Location: [facility];  Service: Cardiovascular;  Laterality: N/A;  . PORT-A-CATH REMOVAL  2016  . PORTACATH PLACEMENT N/A 04/15/2013   Procedure: INSERTION PORT-A-CATH;  Surgeon: Rolm Bookbinder, MD;  Location: [facility];  Service: General;  Laterality: N/A;  . RE-EXCISION OF BREAST CANCER,SUPERIOR MARGINS Right 04/15/2013   Procedure: RE-EXCISION OF RIGHT BREAST  MARGINS;  Surgeon: Rolm Bookbinder, MD;  Location: [facility];  Service: General;  Laterality: Right;  . TONSILLECTOMY  ~ 1935/1936     OB History   No obstetric history on file.    Obstetric Comments  Menarche ae 14 No children Married 27 years         Home Medications    Prior to Admission medications   Medication Sig Start Date End Date Taking? Authorizing Provider  allopurinol (ZYLOPRIM) 100 MG tablet Take 100 mg by mouth daily.  01/17/16  Yes [provider]  apixaban (ELIQUIS) 2.5 MG TABS tablet Take 1 tablet (2.5 mg total) by mouth 2 (two) times daily. 10/07/18  Yes Fenton, Clint R, PA  cholecalciferol (VITAMIN D) 1000 UNITS tablet Take 1,000 Units by mouth daily.   Yes [provider]  cloNIDine (CATAPRES) 0.2 MG tablet Take 0.2 mg by mouth 2 (two) times daily.   Yes [provider]  denosumab (PROLIA) 60 MG/ML SOSY injection Inject 60 mg into the skin every 6 (six) months.   Yes [provider]  furosemide (LASIX) 40 MG tablet Take 40 mg by mouth daily.   Yes [provider]  gabapentin (NEURONTIN) 100 MG capsule Take 200-300 mg by mouth See admin instructions. Take 200 mg by mouth twice a day and take 300 mg by mouth at bedtime   Yes [provider]  glipiZIDE (GLUCOTROL) 5 MG tablet Take 5 mg by mouth 2 (two) times  daily before a meal.    Yes [provider]  hydrALAZINE (APRESOLINE) 10 MG tablet Take 1 tablet (10 mg total) by mouth 3 (three) times daily. 09/29/18 12/28/18 Yes Hosie Poisson, MD  hydrocortisone cream 1 % Apply 1 application topically daily as needed for itching.   Yes [provider]  polyethylene glycol (MIRALAX / GLYCOLAX) packet Take 17 g by mouth daily.    Yes [provider]  traMADol (ULTRAM) 50 MG tablet Take 50 mg by mouth 2 (two) times daily as needed for moderate pain.    Yes [provider]    Family History Family History  Problem Relation Age of Onset  . Pneumonia Mother   . Heart attack Father   . Breast cancer Other 64  niece  . Breast cancer Other 2       niece  . Ovarian cancer Other        niece    Social History Social History   Tobacco Use  . Smoking status: Never Smoker  . Smokeless tobacco: Never Used  . Tobacco comment: only smoked 2 packs cigarettes total; husband quit in 1971  Substance Use Topics  . Alcohol use: Yes    Alcohol/week: 3.0 standard drinks    Types: 3 Glasses of wine per week  . Drug use: No     Allergies   Beta adrenergic blockers   Review of Systems Review of Systems 10 Systems reviewed and are negative for acute change except as noted in the HPI.   Physical Exam Updated Vital Signs BP (!) 188/73   Pulse 67   Temp 98.1 F (36.7 C) (Oral)   Resp 18   SpO2 (!) 85%   Physical Exam Constitutional:      Comments: Patient is alert.  Mental status is clear.  No respiratory distress at rest.  Physical deconditioning and central obesity.  HENT:     Head: Normocephalic and atraumatic.     Mouth/Throat:     Mouth: Mucous membranes are moist.     Pharynx: Oropharynx is clear.  Eyes:     Extraocular Movements: Extraocular movements intact.  Cardiovascular:     Rate and Rhythm: Normal rate and regular rhythm.     Pulses: Normal pulses.     Heart sounds: Normal heart sounds.   Pulmonary:     Effort: Pulmonary effort is normal.     Breath sounds: Normal breath sounds.  Abdominal:     Comments: Abdomen with central obesity and protuberance.  Soft and nontender.  Musculoskeletal:     Comments: 2+ pitting edema bilateral feet and ankles.  Symmetric in appearance.  Calves nontender.  Skin:    General: Skin is warm and dry.     Coloration: Skin is pale.  Neurological:     General: No focal deficit present.     Mental Status: She is oriented to person, place, and time.     Coordination: Coordination normal.  Psychiatric:        Mood and Affect: Mood normal.      ED Treatments / Results  Labs (all labs ordered are listed, but only abnormal results are displayed) Labs Reviewed  COMPREHENSIVE METABOLIC PANEL - Abnormal; Notable for the following components:      Result Value   Glucose, Bld 200 (*)    BUN 57 (*)    Creatinine, Ser 2.02 (*)    Calcium 13.0 (*)    AST 14 (*)    GFR calc non Af Amer 21 (*)    GFR calc Af Amer 25 (*)    All other components within normal limits  CBC WITH DIFFERENTIAL/PLATELET - Abnormal; Notable for the following components:   RBC 3.17 (*)    Hemoglobin 10.3 (*)    HCT 32.7 (*)    MCV 103.2 (*)    All other components within normal limits  PHOSPHORUS - Abnormal; Notable for the following components:   Phosphorus 4.8 (*)    All other components within normal limits  URINE CULTURE  MAGNESIUM  TSH  CALCIUM, IONIZED  T3, FREE  URINALYSIS, ROUTINE W REFLEX MICROSCOPIC    EKG None  Radiology No results found.  Procedures Procedures (including critical care time)  Medications Ordered in ED Medications  0.9 %  sodium chloride infusion (has no administration in time range)     Initial Impression / Assessment and Plan / ED Course  I have reviewed the triage vital signs and the nursing notes.  Pertinent labs & imaging results that were available during my care of the patient were reviewed by me and considered in  my medical decision making (see chart for details).       Consult: Reviewed with Dr. Posey Pronto for admission.  Patient presents from outpatient office for hypercalcemia.  Mental status is clear.  Patient is deconditioned and frail.  She has underlying diastolic congestive heart failure with some peripheral edema.  Patient will be admitted at this time for gentle hydration for hypercalcemia while observing for any worsening congestive heart failure.  Final Clinical Impressions(s) / ED Diagnoses   Final diagnoses:  Hypercalcemia  Severe comorbid illness    ED Discharge Orders    None       Charlesetta Shanks, MD 11/04/18 2245

## 2018-11-04 NOTE — ED Notes (Signed)
Pt requesting water

## 2018-11-05 ENCOUNTER — Observation Stay (HOSPITAL_COMMUNITY): Payer: Medicare Other

## 2018-11-05 ENCOUNTER — Encounter (HOSPITAL_COMMUNITY): Payer: Self-pay

## 2018-11-05 ENCOUNTER — Ambulatory Visit (HOSPITAL_COMMUNITY): Payer: Medicare Other | Admitting: Physician Assistant

## 2018-11-05 ENCOUNTER — Other Ambulatory Visit: Payer: Self-pay

## 2018-11-05 DIAGNOSIS — Z86711 Personal history of pulmonary embolism: Secondary | ICD-10-CM

## 2018-11-05 DIAGNOSIS — N184 Chronic kidney disease, stage 4 (severe): Secondary | ICD-10-CM

## 2018-11-05 DIAGNOSIS — I480 Paroxysmal atrial fibrillation: Secondary | ICD-10-CM

## 2018-11-05 LAB — CBC
HCT: 33.1 % — ABNORMAL LOW (ref 36.0–46.0)
Hemoglobin: 10.6 g/dL — ABNORMAL LOW (ref 12.0–15.0)
MCH: 32.6 pg (ref 26.0–34.0)
MCHC: 32 g/dL (ref 30.0–36.0)
MCV: 101.8 fL — ABNORMAL HIGH (ref 80.0–100.0)
Platelets: 243 10*3/uL (ref 150–400)
RBC: 3.25 MIL/uL — ABNORMAL LOW (ref 3.87–5.11)
RDW: 14.3 % (ref 11.5–15.5)
WBC: 11.8 10*3/uL — ABNORMAL HIGH (ref 4.0–10.5)
nRBC: 0 % (ref 0.0–0.2)

## 2018-11-05 LAB — BASIC METABOLIC PANEL
Anion gap: 11 (ref 5–15)
Anion gap: 9 (ref 5–15)
BUN: 54 mg/dL — ABNORMAL HIGH (ref 8–23)
BUN: 48 mg/dL — ABNORMAL HIGH (ref 8–23)
CO2: 29 mmol/L (ref 22–32)
CO2: 30 mmol/L (ref 22–32)
Calcium: 12.4 mg/dL — ABNORMAL HIGH (ref 8.9–10.3)
Calcium: 11.4 mg/dL — ABNORMAL HIGH (ref 8.9–10.3)
Chloride: 102 mmol/L (ref 98–111)
Chloride: 104 mmol/L (ref 98–111)
Creatinine, Ser: 1.94 mg/dL — ABNORMAL HIGH (ref 0.44–1.00)
Creatinine, Ser: 2.03 mg/dL — ABNORMAL HIGH (ref 0.44–1.00)
GFR calc Af Amer: 26 mL/min — ABNORMAL LOW (ref >60)
GFR calc Af Amer: 25 mL/min — ABNORMAL LOW (ref >60)
GFR calc non Af Amer: 22 mL/min — ABNORMAL LOW (ref >60)
GFR calc non Af Amer: 21 mL/min — ABNORMAL LOW (ref >60)
Glucose, Bld: 214 mg/dL — ABNORMAL HIGH (ref 70–99)
Glucose, Bld: 208 mg/dL — ABNORMAL HIGH (ref 70–99)
Potassium: 3.5 mmol/L (ref 3.5–5.1)
Potassium: 4.2 mmol/L (ref 3.5–5.1)
Sodium: 142 mmol/L (ref 135–145)
Sodium: 143 mmol/L (ref 135–145)

## 2018-11-05 LAB — SARS CORONAVIRUS 2 (TAT 6-24 HRS): SARS Coronavirus 2: NEGATIVE

## 2018-11-05 LAB — CBG MONITORING, ED
Glucose-Capillary: 210 mg/dL — ABNORMAL HIGH (ref 70–99)
Glucose-Capillary: 194 mg/dL — ABNORMAL HIGH (ref 70–99)

## 2018-11-05 LAB — GLUCOSE, CAPILLARY
Glucose-Capillary: 125 mg/dL — ABNORMAL HIGH (ref 70–99)
Glucose-Capillary: 162 mg/dL — ABNORMAL HIGH (ref 70–99)

## 2018-11-05 LAB — VITAMIN B12: Vitamin B-12: 673 pg/mL (ref 180–914)

## 2018-11-05 MED ORDER — GABAPENTIN 300 MG PO CAPS
300.0000 mg | ORAL_CAPSULE | Freq: Every day | ORAL | Status: DC
  Administered 2018-11-05 (×2): 300 mg via ORAL
  Filled 2018-11-05: qty 3
  Filled 2018-11-05: qty 1

## 2018-11-05 MED ORDER — INSULIN ASPART 100 UNIT/ML ~~LOC~~ SOLN
0.0000 [IU] | Freq: Three times a day (TID) | SUBCUTANEOUS | Status: DC
  Administered 2018-11-05: 2 [IU] via SUBCUTANEOUS
  Administered 2018-11-05: 1 [IU] via SUBCUTANEOUS
  Administered 2018-11-05 – 2018-11-06 (×2): 3 [IU] via SUBCUTANEOUS
  Administered 2018-11-06: 1 [IU] via SUBCUTANEOUS
  Administered 2018-11-06: 3 [IU] via SUBCUTANEOUS
  Filled 2018-11-05: qty 0.09

## 2018-11-05 MED ORDER — SENNOSIDES-DOCUSATE SODIUM 8.6-50 MG PO TABS
1.0000 | ORAL_TABLET | Freq: Every evening | ORAL | Status: DC | PRN
  Administered 2018-11-05: 1 via ORAL
  Filled 2018-11-05: qty 1

## 2018-11-05 MED ORDER — GABAPENTIN 100 MG PO CAPS
200.0000 mg | ORAL_CAPSULE | Freq: Two times a day (BID) | ORAL | Status: DC
  Administered 2018-11-05: 200 mg via ORAL
  Filled 2018-11-05: qty 2

## 2018-11-05 MED ORDER — HYDRALAZINE HCL 10 MG PO TABS
10.0000 mg | ORAL_TABLET | Freq: Three times a day (TID) | ORAL | Status: DC
  Administered 2018-11-05: 10 mg via ORAL
  Filled 2018-11-05: qty 1

## 2018-11-05 MED ORDER — FUROSEMIDE 40 MG PO TABS
40.0000 mg | ORAL_TABLET | Freq: Every day | ORAL | Status: DC
  Administered 2018-11-05 – 2018-11-06 (×2): 40 mg via ORAL
  Filled 2018-11-05 (×2): qty 1

## 2018-11-05 MED ORDER — APIXABAN 2.5 MG PO TABS
2.5000 mg | ORAL_TABLET | Freq: Two times a day (BID) | ORAL | Status: DC
  Administered 2018-11-05 – 2018-11-06 (×3): 2.5 mg via ORAL
  Filled 2018-11-05 (×4): qty 1

## 2018-11-05 MED ORDER — SODIUM CHLORIDE 0.9 % IV SOLN
INTRAVENOUS | Status: AC
  Administered 2018-11-05: 05:00:00 via INTRAVENOUS

## 2018-11-05 MED ORDER — CALCITONIN (SALMON) 200 UNIT/ML IJ SOLN
4.0000 [IU]/kg | Freq: Two times a day (BID) | INTRAMUSCULAR | Status: AC
  Administered 2018-11-05 – 2018-11-06 (×4): 314 [IU] via INTRAMUSCULAR
  Filled 2018-11-05 (×5): qty 1.57

## 2018-11-05 MED ORDER — POLYETHYLENE GLYCOL 3350 17 G PO PACK
17.0000 g | PACK | Freq: Every day | ORAL | Status: DC
  Administered 2018-11-05 – 2018-11-06 (×2): 17 g via ORAL
  Filled 2018-11-05 (×2): qty 1

## 2018-11-05 MED ORDER — SODIUM CHLORIDE 0.9 % IV BOLUS
1000.0000 mL | Freq: Once | INTRAVENOUS | Status: AC
  Administered 2018-11-05: 1000 mL via INTRAVENOUS

## 2018-11-05 MED ORDER — ACETAMINOPHEN 325 MG PO TABS: 650.0000 mg | ORAL_TABLET | Freq: Four times a day (QID) | ORAL | Status: DC | PRN

## 2018-11-05 MED ORDER — ACETAMINOPHEN 650 MG RE SUPP: 650.0000 mg | Freq: Four times a day (QID) | RECTAL | Status: DC | PRN

## 2018-11-05 MED ORDER — CLONIDINE HCL 0.2 MG PO TABS
0.2000 mg | ORAL_TABLET | Freq: Two times a day (BID) | ORAL | Status: DC
  Administered 2018-11-05 – 2018-11-06 (×4): 0.2 mg via ORAL
  Filled 2018-11-05 (×2): qty 2
  Filled 2018-11-05 (×2): qty 1

## 2018-11-05 NOTE — ED Notes (Signed)
Patient more alert and awakens to voice. Patient reports "feeling sleepy." This RN had to re-orient patient to place, time, and situation. VSS.

## 2018-11-05 NOTE — ED Notes (Signed)
Bedside report completed. Introduced self to patient. Updated patient on Plan of Care. Patient resting quietly. Denies pain. Denies further needs at this time.

## 2018-11-05 NOTE — Progress Notes (Signed)
PROGRESS NOTE    Sabrina [name]  WNI:[id number] DOB: 03-13-1926 DOA: 11/04/2018 PCP: [name], MD   Brief Narrative: Sabrina [name] is a 85 y.o. female with medical history significant for type 2 diabetes, hypertension, hyperlipidemia, paroxysmal atrial fibrillation on Eliquis, history of CVA, CKD stage III, chronic diastolic CHF, and right-sided breast cancer. Patient presented secondary to hypercalcemia on outpatient labs and started on IV fluids.   Assessment & Plan:   Principal Problem:   Hypercalcemia Active Problems:   History of pulmonary embolism   Chronic diastolic congestive heart failure (HCC)   CKD (chronic kidney disease), stage IV (HCC)   DM (diabetes mellitus) (HCC)   Hypertension   History of stroke   AF (paroxysmal atrial fibrillation) (HCC)   Hypercalcemia Unknown etiology. PTH obtained and pending -Continue IV fluids -Repeat BMP in AM  Lethargy Unknown if medical reason vs extreme fatigue. Patient responds but is slow to respond. Patient was doing much better earlier today, however. CBG not low. Does not show evidence of focal neurologic deficits. -Repeat BMP, CT head  Chronic diastolic heart failure Stable. -Strict in/out, daily weights, Lasix  Essential hypertension Initially uncontrolled but after antihypertensives, has become slightly hypotensive.  -IV fluids for low pressure; hold hydralazine  Paroxysmal atrial fibrillation -Continue Eliquis  Diabetes mellitus, type 2 On glipizide. Would honestly recommend discontinuing this medication as an outpatient secondary to patient's age. -SSI while inpatient  CKD stage III Stable.  History of stroke  Right breast cancer In remission. Oncology made aware of patient's admission.   DVT prophylaxis: Eliquis Code Status:   Code Status: DNR Family Communication: None Disposition Plan: Discharge pending improvement of mentation and improvement of hypercalcemia, likely in one day; PT  eval pending   Consultants:   Oncology  Procedures:   None  Antimicrobials:  None    Subjective: Unable to give history this morning.  Objective: Vitals:   11/05/18 1430 11/05/18 1500 11/05/18 1504 11/05/18 1549  BP: (!) 149/58 (!) 148/67 (!) 148/67   Pulse: 87 90 88 81  Resp: 19 (!) 21 18 (!) 22  Temp:      TempSrc:      SpO2: (!) 89% 90% 96% 98%    Intake/Output Summary (Last 24 hours) at 11/05/2018 1557 Last data filed at 11/05/2018 1024 Gross per 24 hour  Intake 732.26 ml  Output 550 ml  Net 182.26 ml   There were no vitals filed for this visit.  Examination:  General exam: Appears calm and comfortable Respiratory system: Clear to auscultation. Respiratory effort normal. Cardiovascular system: S1 & S2 heard, RRR. No murmurs, rubs, gallops or clicks. Gastrointestinal system: Abdomen is nondistended, soft and nontender. No organomegaly or masses felt. Normal bowel sounds heard. Central nervous system: Lethargic but arouses to painful stimuli. Responded when asked a question, but only once. No focal neurological deficits. Extremities: No edema. No calf tenderness Skin: No cyanosis. No rashes Psychiatry: Judgement and insight appear normal. Mood & affect appropriate.     Data Reviewed: I have personally reviewed following labs and imaging studies  CBC: Recent Labs  Lab 11/04/18 2021 11/05/18 0500  WBC 7.9 11.8*  NEUTROABS 4.7  --   HGB 10.3* 10.6*  HCT 32.7* 33.1*  MCV 103.2* 101.8*  PLT 218 381   Basic Metabolic Panel: Recent Labs  Lab 11/04/18 2021 11/05/18 0500 11/05/18 1159  NA 143 142 143  K 4.1 3.5 4.2  CL 101 102 104  CO2 32 29 30  GLUCOSE 200*  214* 208*  BUN 57* 54* 48*  CREATININE 2.02* 1.94* 2.03*  CALCIUM 13.0* 12.4* 11.4*  MG 2.3  --   --   PHOS 4.8*  --   --    GFR: Estimated Creatinine Clearance: 17 mL/min (A) (by C-G formula based on SCr of 2.03 mg/dL (H)). Liver Function Tests: Recent Labs  Lab 11/04/18 2021  AST 14*   ALT 15  ALKPHOS 88  BILITOT 0.3  PROT 6.6  ALBUMIN 3.5   No results for input(s): LIPASE, AMYLASE in the last 168 hours. No results for input(s): AMMONIA in the last 168 hours. Coagulation Profile: No results for input(s): INR, PROTIME in the last 168 hours. Cardiac Enzymes: No results for input(s): CKTOTAL, CKMB, CKMBINDEX, TROPONINI in the last 168 hours. BNP (last 3 results) No results for input(s): PROBNP in the last 8760 hours. HbA1C: No results for input(s): HGBA1C in the last 72 hours. CBG: Recent Labs  Lab 11/05/18 0756 11/05/18 1139  GLUCAP 210* 194*   Lipid Profile: No results for input(s): CHOL, HDL, LDLCALC, TRIG, CHOLHDL, LDLDIRECT in the last 72 hours. Thyroid Function Tests: Recent Labs    11/04/18 2021  TSH 1.270   Anemia Panel: Recent Labs    11/04/18 2026  VITAMINB12 673   Sepsis Labs: No results for input(s): PROCALCITON, LATICACIDVEN in the last 168 hours.  Recent Results (from the past 240 hour(s))  SARS CORONAVIRUS 2 (TAT 6-24 HRS) Nasopharyngeal Nasopharyngeal Swab     Status: None   Collection Time: 11/05/18 12:47 AM   Specimen: Nasopharyngeal Swab  Result Value Ref Range Status   SARS Coronavirus 2 NEGATIVE NEGATIVE Final    Comment: (NOTE) SARS-CoV-2 target nucleic acids are NOT DETECTED. The SARS-CoV-2 RNA is generally detectable in upper and lower respiratory specimens during the acute phase of infection. Negative results do not preclude SARS-CoV-2 infection, do not rule out co-infections with other pathogens, and should not be used as the sole basis for treatment or other patient management decisions. Negative results must be combined with clinical observations, patient history, and epidemiological information. The expected result is Negative. Fact Sheet for Patients: SugarRoll.be Fact Sheet for Healthcare Providers: https://www.woods-mathews.com/ This test is not yet approved or cleared  by the [place] FDA and  has been authorized for detection and/or diagnosis of SARS-CoV-2 by FDA under an Emergency Use Authorization (EUA). This EUA will remain  in effect (meaning this test can be used) for the duration of the COVID-19 declaration under Section 56 4(b)(1) of the Act, 21 U.S.C. section 360bbb-3(b)(1), unless the authorization is terminated or revoked sooner. Performed at [facility], [place] [address] [place], [place] [address]          Radiology Studies: Ct Head Wo Contrast  Result Date: 11/05/2018 CLINICAL DATA:  85 year old female with altered mental status. EXAM: CT HEAD WITHOUT CONTRAST TECHNIQUE: Contiguous axial images were obtained from the base of the skull through the vertex without intravenous contrast. COMPARISON:  Head CT dated 09/26/2018 FINDINGS: Brain: There is moderate age-related atrophy and chronic microvascular ischemic changes. Bilateral occipital old infarcts noted. There is no acute intracranial hemorrhage. No mass effect or midline shift no extra-axial fluid collection. Vascular: No hyperdense vessel or unexpected calcification. Skull: Normal. Negative for fracture or focal lesion. Sinuses/Orbits: No acute finding. Other: None IMPRESSION: 1. No acute intracranial hemorrhage. 2. Age-related atrophy and chronic microvascular ischemic changes. Old bilateral occipital infarcts. Electronically Signed   By: Anner Crete M.D.   On: 11/05/2018 13:03  Scheduled Meds:  apixaban  2.5 mg Oral BID   calcitonin  4 Units/kg Intramuscular BID   cloNIDine  0.2 mg Oral BID   furosemide  40 mg Oral Daily   gabapentin  200 mg Oral BID WC   gabapentin  300 mg Oral QHS   insulin aspart  0-9 Units Subcutaneous TID WC   polyethylene glycol  17 g Oral Daily   Continuous Infusions:   LOS: 0 days     Cordelia Poche, MD [facility] 11/05/2018, 3:57 PM  If 7PM-7AM, please contact night-coverage [email or web address]

## 2018-11-05 NOTE — ED Notes (Signed)
MD Nettey at bedside. Patient difficult to arouse, very sleepy, and blood pressure is low. Patient does wake to pain, and answers questions, but then falls back asleep. CBG is WNL. Fluids and lab work ordered by MD. Will reassess.

## 2018-11-05 NOTE — ED Notes (Signed)
Patient assisted by Herbie Baltimore EMT and this RN to bedside commode. Patient very weak on feet. Patient voided and peri care completed. Patient brief replaced and linens on bed changed. Patient assisted to eat breakfast due to weakness. Patient tolerated well. VSS. Denies pain. Denies further needs. Patient gave this RN permission to call and update sister.

## 2018-11-05 NOTE — ED Notes (Signed)
This RN gave and set up patient breakfast tray. Patient VSS. Denies other needs at this time.

## 2018-11-05 NOTE — ED Notes (Signed)
Gave pt [place] sandwich, applesauce

## 2018-11-05 NOTE — ED Notes (Signed)
Patient alert to self and place, but requires frequent reorientation to situation and time. Patient more awake now. Denies needing to use the restroom. Patient denies pain. VSS.

## 2018-11-05 NOTE — ED Notes (Signed)
ED TO INPATIENT HANDOFF REPORT  Name/Age/Gender Sabrina [name] 85 y.o. female  Code Status    Code Status Orders  (From admission, onward)         Start     Ordered   11/05/18 0007  Do not attempt resuscitation (DNR)  Continuous    Question Answer Comment  In the event of cardiac or respiratory ARREST Do not call a "code blue"   In the event of cardiac or respiratory ARREST Do not perform Intubation, CPR, defibrillation or ACLS   In the event of cardiac or respiratory ARREST Use medication by any route, position, wound care, and other measures to relive pain and suffering. May use oxygen, suction and manual treatment of airway obstruction as needed for comfort.      11/05/18 0007        Code Status History    Date Active Date Inactive Code Status Order ID Comments User Context   10/24/2018 1445 10/28/2018 2017 Full Code [id number]  Terrilee Croak, MD ED   09/26/2018 1733 09/29/2018 2318 Full Code [id number]  Barb Merino, MD Inpatient   06/25/2017 0333 06/27/2017 1736 Full Code [id number]  Vianne Bulls, MD ED   05/30/2017 1638 06/04/2017 1826 Full Code [id number]  Rondel Jumbo, PA-C ED   05/27/2017 1210 05/28/2017 2114 Full Code [id number]  Patrecia Pour, MD ED   09/13/2013 1055 09/14/2013 1745 Full Code [id number]  Reyne Dumas, MD ED   06/10/2013 2100 06/19/2013 1940 Full Code [id number]  Mendel Corning, MD Inpatient   04/15/2013 2004 04/16/2013 1156 Full Code [id number]  Rolm Bookbinder, MD Inpatient   03/30/2013 2104 03/31/2013 2039 Full Code [id number]  Rolm Bookbinder, MD Inpatient   Advance Care Planning Activity      Home/SNF/Other Home  Chief Complaint Abnormal Lab  Level of Care/Admitting Diagnosis ED Disposition    ED Disposition Condition [place]: [facility] [100102]  Level of Care: Med-Surg [16]  Covid Evaluation: Asymptomatic Screening Protocol (No Symptoms)  Diagnosis: Hypercalcemia [275.42.ICD-9-CM]  Admitting  Physician: Lenore Cordia [[id number]]  Attending Physician: Lenore Cordia [[id number]]  PT Class (Do Not Modify): Observation [104]  PT Acc Code (Do Not Modify): Observation [10022]       Medical History Past Medical History:  Diagnosis Date  . Arthritis    "mostly in my hands, lower back" (06/25/2017)  . Basal cell carcinoma (BCC) of face 1983  . Breast cancer, right breast ([place]) 03/2013  . Chronic kidney disease (CKD), stage III (moderate) (HCC)    nephrologist, Dr. [name]  . Dental crowns present   . DVT (deep venous thrombosis) ([place]) ~ 06/2013   "? side"  . Gout    "on daily RX" (06/25/2017)  . Heart murmur    no known problems; states did not know she had murmur until age 18  . High cholesterol   . Hypertension    fluctuates, especially when stressed; has been on med. > 20 yr.  . Immature cataract   . Non-insulin dependent type 2 diabetes mellitus ([place])   . Personal history of chemotherapy   . Personal history of radiation therapy   . Pulmonary embolism ([place]) ~ 06/2013  . Radiation 09/06/13-10/20/13   Right Breast Cancer  . Stroke ([place]) 05/2017   just visual problems since (06/25/2017)  . Wears partial dentures    lower    Allergies Allergies  Allergen Reactions  . Beta Adrenergic Blockers  IV Location/Drains/Wounds Patient Lines/Drains/Airways Status   Active Line/Drains/Airways    Name:   Placement date:   Placement time:   Site:   Days:   Peripheral IV 10/27/18 Left;Anterior Forearm   10/27/18    0116    Forearm   9   Peripheral IV 11/05/18 Left Antecubital   11/05/18    0455    Antecubital   less than 1          Labs/Imaging Results for orders placed or performed during the hospital encounter of 11/04/18 (from the past 48 hour(s))  Comprehensive metabolic panel     Status: Abnormal   Collection Time: 11/04/18  8:21 PM  Result Value Ref Range   Sodium 143 135 - 145 mmol/L   Potassium 4.1 3.5 - 5.1 mmol/L   Chloride 101 98 - 111 mmol/L    CO2 32 22 - 32 mmol/L   Glucose, Bld 200 (H) 70 - 99 mg/dL   BUN 57 (H) 8 - 23 mg/dL   Creatinine, Ser 2.02 (H) 0.44 - 1.00 mg/dL   Calcium 13.0 (H) 8.9 - 10.3 mg/dL   Total Protein 6.6 6.5 - 8.1 g/dL   Albumin 3.5 3.5 - 5.0 g/dL   AST 14 (L) 15 - 41 U/L   ALT 15 0 - 44 U/L   Alkaline Phosphatase 88 38 - 126 U/L   Total Bilirubin 0.3 0.3 - 1.2 mg/dL   GFR calc non Af Amer 21 (L) >60 mL/min   GFR calc Af Amer 25 (L) >60 mL/min   Anion gap 10 5 - 15    Comment: Performed at [facility], [place] [address] [place], [place] [address]  CBC with Differential     Status: Abnormal   Collection Time: 11/04/18  8:21 PM  Result Value Ref Range   WBC 7.9 4.0 - 10.5 K/uL   RBC 3.17 (L) 3.87 - 5.11 MIL/uL   Hemoglobin 10.3 (L) 12.0 - 15.0 g/dL   HCT 32.7 (L) 36.0 - 46.0 %   MCV 103.2 (H) 80.0 - 100.0 fL   MCH 32.5 26.0 - 34.0 pg   MCHC 31.5 30.0 - 36.0 g/dL   RDW 14.2 11.5 - 15.5 %   Platelets 218 150 - 400 K/uL   nRBC 0.0 0.0 - 0.2 %   Neutrophils Relative % 60 %   Neutro Abs 4.7 1.7 - 7.7 K/uL   Lymphocytes Relative 29 %   Lymphs Abs 2.3 0.7 - 4.0 K/uL   Monocytes Relative 7 %   Monocytes Absolute 0.6 0.1 - 1.0 K/uL   Eosinophils Relative 4 %   Eosinophils Absolute 0.3 0.0 - 0.5 K/uL   Basophils Relative 0 %   Basophils Absolute 0.0 0.0 - 0.1 K/uL   Immature Granulocytes 0 %   Abs Immature Granulocytes 0.03 0.00 - 0.07 K/uL    Comment: Performed at [facility], [place] [address], [place], [place] [address]  Magnesium     Status: None   Collection Time: 11/04/18  8:21 PM  Result Value Ref Range   Magnesium 2.3 1.7 - 2.4 mg/dL    Comment: Performed at [facility], [place] [address], [place], [place] [address]  TSH     Status: None   Collection Time: 11/04/18  8:21 PM  Result Value Ref Range   TSH 1.270 0.350 - 4.500 uIU/mL    Comment: Performed by a 3rd Generation assay with a functional sensitivity of <=0.01 uIU/mL. Performed at  [facility], [place] [address] [place], [place] [address]   Phosphorus     Status: Abnormal   Collection Time: 11/04/18  8:21 PM  Result Value Ref Range   Phosphorus 4.8 (H) 2.5 - 4.6 mg/dL    Comment: Performed at [facility], [place] [address] [place], [place] [address]  Vitamin B12     Status: None   Collection Time: 11/04/18  8:26 PM  Result Value Ref Range   Vitamin B-12 673 180 - 914 pg/mL    Comment: (NOTE) This assay is not validated for testing neonatal or myeloproliferative syndrome specimens for Vitamin B12 levels. Performed at [facility], [place] [address], [place], [place] [address]   Urinalysis, Routine w reflex microscopic     Status: Abnormal   Collection Time: 11/04/18 10:34 PM  Result Value Ref Range   Color, Urine STRAW (A) YELLOW   APPearance CLEAR CLEAR   Specific Gravity, Urine 1.006 1.005 - 1.030   pH 7.0 5.0 - 8.0   Glucose, UA 50 (A) NEGATIVE mg/dL   Hgb urine dipstick NEGATIVE NEGATIVE   Bilirubin Urine NEGATIVE NEGATIVE   Ketones, ur NEGATIVE NEGATIVE mg/dL   Protein, ur 30 (A) NEGATIVE mg/dL   Nitrite NEGATIVE NEGATIVE   Leukocytes,Ua NEGATIVE NEGATIVE   RBC / HPF 0-5 0 - 5 RBC/hpf   WBC, UA 0-5 0 - 5 WBC/hpf   Bacteria, UA RARE (A) NONE SEEN   Squamous Epithelial / LPF 0-5 0 - 5   Mucus PRESENT     Comment: Performed at [facility], [place] [address], [place], [place] [address]  CBC     Status: Abnormal   Collection Time: 11/05/18  5:00 AM  Result Value Ref Range   WBC 11.8 (H) 4.0 - 10.5 K/uL   RBC 3.25 (L) 3.87 - 5.11 MIL/uL   Hemoglobin 10.6 (L) 12.0 - 15.0 g/dL   HCT 33.1 (L) 36.0 - 46.0 %   MCV 101.8 (H) 80.0 - 100.0 fL   MCH 32.6 26.0 - 34.0 pg   MCHC 32.0 30.0 - 36.0 g/dL   RDW 14.3 11.5 - 15.5 %   Platelets 243 150 - 400 K/uL   nRBC 0.0 0.0 - 0.2 %    Comment: Performed at [facility], [place] [address] [place], [place] [address]  Basic  metabolic panel     Status: Abnormal   Collection Time: 11/05/18  5:00 AM  Result Value Ref Range   Sodium 142 135 - 145 mmol/L   Potassium 3.5 3.5 - 5.1 mmol/L   Chloride 102 98 - 111 mmol/L   CO2 29 22 - 32 mmol/L   Glucose, Bld 214 (H) 70 - 99 mg/dL   BUN 54 (H) 8 - 23 mg/dL   Creatinine, Ser 1.94 (H) 0.44 - 1.00 mg/dL   Calcium 12.4 (H) 8.9 - 10.3 mg/dL   GFR calc non Af Amer 22 (L) >60 mL/min   GFR calc Af Amer 26 (L) >60 mL/min   Anion gap 11 5 - 15    Comment: Performed at [facility], [place] [address] [place], [place] [address]  CBG monitoring, ED     Status: Abnormal   Collection Time: 11/05/18  7:56 AM  Result Value Ref Range   Glucose-Capillary 210 (H) 70 - 99 mg/dL   No results found.  Pending Labs [employer] (From admission, onward)    Start     Ordered  11/05/18 0007  SARS CORONAVIRUS 2 (TAT 6-24 HRS) Nasopharyngeal Nasopharyngeal Swab  (Asymptomatic/Tier 2)  Once,   STAT    Question Answer Comment  Is this test for diagnosis or screening Screening   Symptomatic for COVID-19 as defined by CDC No   Hospitalized for COVID-19 No   Admitted to ICU for COVID-19 No   Previously tested for COVID-19 Yes   Resident in a congregate (group) care setting No   Employed in healthcare setting No   Pregnant No      11/05/18 0006   11/04/18 2339  Parathyroid hormone, intact (no Ca)  Add-on,   AD     11/04/18 2338   11/04/18 2219  Urine culture  ONCE - STAT,   STAT     11/04/18 2218   11/04/18 2006  T3, free  ONCE - STAT,   STAT     11/04/18 2005   11/04/18 2005  Calcium, ionized  Once,   STAT     11/04/18 2005          Vitals/Pain Today's Vitals   11/05/18 0800 11/05/18 0900 11/05/18 1000 11/05/18 1002  BP: (!) 128/44 (!) 98/44 (!) 148/76 (!) 148/76  Pulse: 67 (!) 59 77   Resp: 16 13 20    Temp:  (!) 97 F (36.1 C)    TempSrc:  Oral    SpO2: 97% 96% 93%   PainSc:  0-No pain      Isolation Precautions No active  isolations  Medications Medications  acetaminophen (TYLENOL) tablet 650 mg (has no administration in time range)    Or  acetaminophen (TYLENOL) suppository 650 mg (has no administration in time range)  senna-docusate (Senokot-S) tablet 1 tablet (1 tablet Oral Given 11/05/18 0057)  apixaban (ELIQUIS) tablet 2.5 mg (2.5 mg Oral Given 11/05/18 1000)  cloNIDine (CATAPRES) tablet 0.2 mg (0.2 mg Oral Given 11/05/18 1002)  gabapentin (NEURONTIN) capsule 300 mg (300 mg Oral Given 11/05/18 0051)  furosemide (LASIX) tablet 40 mg (40 mg Oral Given 11/05/18 1003)  hydrALAZINE (APRESOLINE) tablet 10 mg (10 mg Oral Given 11/05/18 1003)  polyethylene glycol (MIRALAX / GLYCOLAX) packet 17 g (17 g Oral Given 11/05/18 1003)  0.9 %  sodium chloride infusion ( Intravenous Rate/Dose Verify 11/05/18 1011)  calcitonin (MIACALCIN) injection 314 Units (314 Units Intramuscular Given 11/05/18 1003)  gabapentin (NEURONTIN) capsule 200 mg (200 mg Oral Given 11/05/18 0957)  insulin aspart (novoLOG) injection 0-9 Units (3 Units Subcutaneous Given 11/05/18 0954)    Mobility manual wheelchair

## 2018-11-05 NOTE — Progress Notes (Signed)
Call made to ED to attempt to get report on patient; no answer. Donne Hazel, RN

## 2018-11-05 NOTE — ED Notes (Signed)
Pt assisted to the bedside commode and then back to bed with writer, Judson Roch, RN and [place], RN. Pt has unsteady gait without assist. Pt repositioned in bed for comfort as well as given a new brief. Pt given ice water. Pt has call bell within reach and knows to call staff for assistance.

## 2018-11-06 DIAGNOSIS — I480 Paroxysmal atrial fibrillation: Secondary | ICD-10-CM | POA: Diagnosis not present

## 2018-11-06 DIAGNOSIS — N184 Chronic kidney disease, stage 4 (severe): Secondary | ICD-10-CM | POA: Diagnosis not present

## 2018-11-06 DIAGNOSIS — Z86711 Personal history of pulmonary embolism: Secondary | ICD-10-CM | POA: Diagnosis not present

## 2018-11-06 LAB — GLUCOSE, CAPILLARY
Glucose-Capillary: 150 mg/dL — ABNORMAL HIGH (ref 70–99)
Glucose-Capillary: 228 mg/dL — ABNORMAL HIGH (ref 70–99)
Glucose-Capillary: 213 mg/dL — ABNORMAL HIGH (ref 70–99)

## 2018-11-06 LAB — BASIC METABOLIC PANEL
Anion gap: 4 — ABNORMAL LOW (ref 5–15)
BUN: 43 mg/dL — ABNORMAL HIGH (ref 8–23)
CO2: 30 mmol/L (ref 22–32)
Calcium: 10.4 mg/dL — ABNORMAL HIGH (ref 8.9–10.3)
Chloride: 110 mmol/L (ref 98–111)
Creatinine, Ser: 1.91 mg/dL — ABNORMAL HIGH (ref 0.44–1.00)
GFR calc Af Amer: 26 mL/min — ABNORMAL LOW (ref >60)
GFR calc non Af Amer: 23 mL/min — ABNORMAL LOW (ref >60)
Glucose, Bld: 174 mg/dL — ABNORMAL HIGH (ref 70–99)
Potassium: 3.9 mmol/L (ref 3.5–5.1)
Sodium: 144 mmol/L (ref 135–145)

## 2018-11-06 LAB — PARATHYROID HORMONE, INTACT (NO CA): PTH: 25 pg/mL (ref 15–65)

## 2018-11-06 LAB — T3, FREE: T3, Free: 2 pg/mL (ref 2.0–4.4)

## 2018-11-06 LAB — CALCIUM, IONIZED: Calcium, Ionized, Serum: 7.2 mg/dL — ABNORMAL HIGH (ref 4.5–5.6)

## 2018-11-06 MED ORDER — GABAPENTIN 100 MG PO CAPS: 300.0000 mg | ORAL_CAPSULE | Freq: Every day | ORAL | Status: DC

## 2018-11-06 NOTE — Progress Notes (Signed)
Pt was d/cd home, caregiver Basilia Jumbo came to pick her up, d/c instructions were given.

## 2018-11-06 NOTE — Plan of Care (Signed)
All goals met for d/c 

## 2018-11-06 NOTE — TOC Transition Note (Signed)
Transition of Care [facility]) - CM/SW Discharge Note   Patient Details  Name: Sabrina [name] MRN: [id number] Date of Birth: [birth year]  Transition of Care [facility]) CM/SW Contact:  Lynnell Catalan, RN Phone Number: 11/06/2018, 3:22 PM   Clinical Narrative:     Pt was active with [facility]) prior to admission. [facility] liaison alerted that pt back in hospital and need for home health at discharge.        Patient Goals and CMS Choice        Discharge Placement                       Discharge Plan and Services                                     Social Determinants of Health (SDOH) Interventions     Readmission Risk Interventions No flowsheet data found.

## 2018-11-06 NOTE — Discharge Instructions (Signed)
Sabrina [name],  You were in the hospital because of high calcium. It is unclear of why this is the case. It could be secondary to your kidneys versus another reason. There is still labwork that is pending and you can follow-up with your primary care physician to continue working this issue up. It appears you had some high calcium months ago as well. Please keep hydrated to help your calcium levels.

## 2018-11-06 NOTE — Evaluation (Signed)
Physical Therapy Evaluation Patient Details Name: Sabrina [name] MRN: [id number] DOB: 07/31/1926 Today's Date: 11/06/2018   History of Present Illness  85yo female with history of recent admit August 2020 for UTI after which she was [facility] home with HHPT. Now returning and admitted for hypercalcemia. PMH breast CA, CKD, gout, HTN, DM, CVA, A-fib on chronic Eliquis, CHF, history of PE and DVT  Clinical Impression   Patient received upright in bedside recliner, lethargic and requiring Max verbal and tactile stimulation to wake today; even with improved awareness following verbal/tactile stimulation, patient often closing eyes and appearing to doze off during session. Able to scoot forward/backward in chair with Min guard and extended time, however did require [place] for sit to stand with RW. Able to march in place in front of chair but appeared to become fatigued and was returned to sitting. Able to answer PT's prior history questions fairly accurately as compared to information listed in prior charting, and once awake able to correctly answer all A&O questions. She reports and chart review confirms that she and her husband (who has dementia) have 24/7 assistance at home- feel that due to having this level of assistance, with ongoing skilled PT in the hospital setting she should be able to return home with HHPT however may need to update recommendation if she does not progress as expected with therapy. She was left up in chair with alarm active and all needs otherwise met this morning.     Follow Up Recommendations Home health PT;Supervision/Assistance - 24 hour    Equipment Recommendations  None recommended by PT    Recommendations for Other Services       Precautions / Restrictions Precautions Precautions: Fall Restrictions Weight Bearing Restrictions: No      Mobility  Bed Mobility               General bed mobility comments: OOB in chair, per prior charting also usually sleeps in  chair and not bed  Transfers Overall transfer level: Needs assistance Equipment used: Rolling walker (2 wheeled) Transfers: Sit to/from Stand Sit to Stand: Mod assist         General transfer comment: ModA and extended time due to lethargy  Ambulation/Gait             General Gait Details: DNT due to lethargy and having only assist of +1 this morning (safety concerns with level of awareness)  Stairs            Wheelchair Mobility    Modified Rankin (Stroke Patients Only)       Balance Overall balance assessment: Needs assistance Sitting-balance support: Feet supported;No upper extremity supported Sitting balance-Leahy Scale: Fair     Standing balance support: During functional activity;Bilateral upper extremity supported Standing balance-Leahy Scale: Poor Standing balance comment: reliant on UEs and external assist                             Pertinent Vitals/Pain Pain Assessment: Faces Pain Score: 0-No pain Faces Pain Scale: No hurt    Home Living Family/patient expects to be discharged to:: Private residence Living Arrangements: Spouse/significant other Available Help at Discharge: Personal care attendant;Available 24 hours/day Type of Home: House Home Access: Stairs to enter Entrance Stairs-Rails: None Entrance Stairs-Number of Steps: 7 in front; 2 in back; reports railings in front but none in back Home Layout: One level Home Equipment: Walker - 2 wheels;Shower seat;Grab bars - tub/shower;Wheelchair - manual  Additional Comments: caregivers for pt and spouse (spouse has dementia) 24/7    Prior Function Level of Independence: Needs assistance   Gait / Transfers Assistance Needed: uses RW for functional mobility, able to walk 3-4 yards  ADL's / Homemaking Assistance Needed: caregiver assist with bathing, dressing, home maintaining and cooking        Hand Dominance   Dominant Hand: Right    Extremity/Trunk Assessment   Upper  Extremity Assessment Upper Extremity Assessment: Generalized weakness    Lower Extremity Assessment Lower Extremity Assessment: Generalized weakness    Cervical / Trunk Assessment Cervical / Trunk Assessment: Kyphotic  Communication   Communication: No difficulties  Cognition Arousal/Alertness: Lethargic Behavior During Therapy: Flat affect Overall Cognitive Status: Within Functional Limits for tasks assessed                                 General Comments: lethargic but able to answer questsions accurately as compared to information priorly charted      General Comments      Exercises     Assessment/Plan    PT Assessment Patient needs continued PT services  PT Problem List Decreased strength;Decreased mobility;Decreased safety awareness;Decreased coordination;Obesity;Decreased activity tolerance;Decreased balance;Decreased knowledge of use of DME       PT Treatment Interventions DME instruction;Gait training;Functional mobility training;Therapeutic activities;Patient/family education;Therapeutic exercise;Balance training;Neuromuscular re-education;Stair training    PT Goals (Current goals can be found in the Care Plan section)  Acute Rehab PT Goals Patient Stated Goal: get stonger PT Goal Formulation: With patient Time For Goal Achievement: 11/20/18 Potential to Achieve Goals: Fair    Frequency Min 3X/week   Barriers to discharge        Co-evaluation               AM-PAC PT "6 Clicks" Mobility  Outcome Measure Help needed turning from your back to your side while in a flat bed without using bedrails?: A Lot Help needed moving from lying on your back to sitting on the side of a flat bed without using bedrails?: A Lot Help needed moving to and from a bed to a chair (including a wheelchair)?: A Lot Help needed standing up from a chair using your arms (e.g., wheelchair or bedside chair)?: A Lot Help needed to walk in hospital room?: A  Lot Help needed climbing 3-5 steps with a railing? : A Lot 6 Click Score: 12    End of Session Equipment Utilized During Treatment: Oxygen Activity Tolerance: Patient limited by lethargy Patient left: in chair;with call bell/phone within reach;with chair alarm set   PT Visit Diagnosis: Difficulty in walking, not elsewhere classified (R26.2);Muscle weakness (generalized) (M62.81)    Time: 6213-0865 PT Time Calculation (min) (ACUTE ONLY): 25 min   Charges:   PT Evaluation $PT Eval Moderate Complexity: 1 Mod PT Treatments $Therapeutic Activity: 8-22 mins        Deniece Ree PT, DPT, CBIS  Supplemental Physical Therapist [name]    Pager [phone] Acute Rehab Office [phone]

## 2018-11-06 NOTE — Discharge Summary (Signed)
Physician Discharge Summary  Cle Elum Fearn NWG:[id number] DOB: Jun 11, 1926 DOA: 11/04/2018  PCP: [name], MD  Admit date: 11/04/2018 Discharge date: 11/06/2018  Admitted From: Home Disposition: Home  Recommendations for Outpatient Follow-up:  1. Follow up with PCP in 1 week 2. Please obtain BMP/CBC in one week 3. Please follow up on the following pending results: PTH, urine culture results  Home Health: PT Equipment/Devices: None  Discharge Condition: Stable CODE STATUS: DNR Diet recommendation: Heart healthy   Brief/Interim Summary:  Admission HPI written by Lenore Cordia, MD   Chief Complaint: Hypercalcemia  HPI: Sabrina [name] is a 85 y.o. female with medical history significant for type 2 diabetes, hypertension, hyperlipidemia, paroxysmal atrial fibrillation on Eliquis, history of CVA, CKD stage III, chronic diastolic CHF, and right-sided breast cancer who presents to the ED for evaluation of hypercalcemia.  Patient recently admitted from 10/24/2018-10/28/2018 for acute encephalopathy secondary to enterococcus faecium UTI.  She was treated with ceftriaxone and discharged on Augmentin with home health PT.  Patient states she normally uses a wheelchair for transport and occasionally a walker to ambulate short distances.  She has been having generalized weakness and constipation.  She reports tremors in her hands which are chronic.  She has chronic swelling in both of her legs.  She denies any abdominal pain, dysuria, or diarrhea.  She denies any subjective fevers or diaphoresis.  She reports good oral intake without nausea or vomiting.  She was seen at her PCP office and labs showed elevated calcium and she was subsequently sent to the ED for further evaluation.  ED Course:  Initial vitals showed BP 175/53, pulse 67, RR 15, temp 98.1 Fahrenheit, SPO2 97% on room air.  Labs notable for calcium 13.0, albumin 3.5, BUN 57, creatinine 2.02, potassium 4.1, sodium  143, bicarb 32, WBC 7.9, hemoglobin 10.3, platelets 218,000, magnesium 2.3, TSH 1.27, phosphorus 4.8.  Hospitalist service was consulted for further evaluation and management of hypercalcemia.   Hospital course:  Hypercalcemia Unknown etiology. PTH obtained and pending. Treated with IV fluids. Calcium of 13 on admission, down to 10.4 prior to discharge. PCP follow-up to continue workup. Home with [facility] PT  Lethargy Unknown if medical reason vs extreme fatigue. Patient responds but is slow to respond. Patient was doing much better earlier today, however. CBG not low. Does not show evidence of focal neurologic deficits. CT head unremarkable and symptoms resolve. No etiology found as mentioned earlier, possibly secondary to extreme fatigue.  Chronic diastolic heart failure Stable. Strict in/out, daily weights, Lasix.  Essential hypertension Continue home medications  Paroxysmal atrial fibrillation Continue Eliquis  Diabetes mellitus, type 2 On glipizide as an outpatient. Consider discontinuing and switching to other regimen secondary to age.  CKD stage III Stable.  History of stroke  Right breast cancer In remission. Oncology made aware of patient's admission.  Positive urine culture No urinary symptoms. Urine culture significant for 50,000 colonies of GNR. Final culture results pending. If develops symptoms, would readdress.  Discharge Diagnoses:  Principal Problem:   Hypercalcemia Active Problems:   History of pulmonary embolism   Chronic diastolic congestive heart failure (HCC)   CKD (chronic kidney disease), stage IV (HCC)   DM (diabetes mellitus) (HCC)   Hypertension   History of stroke   AF (paroxysmal atrial fibrillation) [facility])    Discharge Instructions  Discharge Instructions    Call MD for:  extreme fatigue   Complete by: As directed    Diet - low sodium heart  healthy   Complete by: As directed    Increase activity slowly   Complete by: As  directed      Allergies as of 11/06/2018      Reactions   Beta Adrenergic Blockers       Medication List    TAKE these medications   allopurinol 100 MG tablet Commonly known as: ZYLOPRIM Take 100 mg by mouth daily.   apixaban 2.5 MG Tabs tablet Commonly known as: Eliquis Take 1 tablet (2.5 mg total) by mouth 2 (two) times daily.   cholecalciferol 1000 units tablet Commonly known as: VITAMIN D Take 1,000 Units by mouth daily.   cloNIDine 0.2 MG tablet Commonly known as: CATAPRES Take 0.2 mg by mouth 2 (two) times daily.   denosumab 60 MG/ML Sosy injection Commonly known as: PROLIA Inject 60 mg into the skin every 6 (six) months.   furosemide 40 MG tablet Commonly known as: LASIX Take 40 mg by mouth daily.   gabapentin 100 MG capsule Commonly known as: NEURONTIN Take 3 capsules (300 mg total) by mouth at bedtime. Take 200 mg by mouth twice a day and take 300 mg by mouth at bedtime What changed:   how much to take  when to take this   glipiZIDE 5 MG tablet Commonly known as: GLUCOTROL Take 5 mg by mouth 2 (two) times daily before a meal.   hydrALAZINE 10 MG tablet Commonly known as: APRESOLINE Take 1 tablet (10 mg total) by mouth 3 (three) times daily.   hydrocortisone cream 1 % Apply 1 application topically daily as needed for itching.   polyethylene glycol 17 g packet Commonly known as: MIRALAX / GLYCOLAX Take 17 g by mouth daily.   traMADol 50 MG tablet Commonly known as: ULTRAM Take 50 mg by mouth 2 (two) times daily as needed for moderate pain.      Follow-up Information    Stoneking, Hal, MD. Schedule an appointment as soon as possible for a visit in 1 week(s).   Specialty: Internal Medicine Contact information: [address] [place] [address] [phone]          Allergies  Allergen Reactions   Beta Adrenergic Blockers     Consultations:  None   Procedures/Studies: Ct Abdomen Pelvis Wo Contrast  Result  Date: 10/24/2018 CLINICAL DATA:  Abdominal pain. Left upper quadrant and flank tenderness. UTI. EXAM: CT ABDOMEN AND PELVIS WITHOUT CONTRAST TECHNIQUE: Multidetector CT imaging of the abdomen and pelvis was performed following the standard protocol without IV contrast. COMPARISON:  None. FINDINGS: Lower chest: 4 mm right lower lobe pulmonary nodule stable since chest CT of 08/20/2015, consistent with benign etiology such as scarring. Mitral annular calcification noted. Hepatobiliary: No focal abnormality in the liver on this study without intravenous contrast. Calcified gallstones noted. No intrahepatic or extrahepatic biliary dilation. Pancreas: No focal mass lesion. No dilatation of the main duct. No intraparenchymal cyst. No peripancreatic edema. Spleen: No splenomegaly. No focal mass lesion. Adrenals/Urinary Tract: No adrenal nodule or mass. No renal stones or hydronephrosis in either kidney. No gross renal mass on this noncontrast study. No evidence for hydroureter. The urinary bladder appears normal for the degree of distention. Stomach/Bowel: Tiny hiatal hernia. Stomach otherwise unremarkable. Duodenum is normally positioned as is the ligament of Treitz. No small bowel wall thickening. No small bowel dilatation. The terminal ileum is normal. The appendix is not visualized, but there is no edema or inflammation in the region of the cecum. No gross colonic  mass. No colonic wall thickening. Diverticular changes are noted in the left colon without evidence of diverticulitis. Vascular/Lymphatic: There is abdominal aortic atherosclerosis without aneurysm. There is no gastrohepatic or hepatoduodenal ligament lymphadenopathy. No intraperitoneal or retroperitoneal lymphadenopathy. No pelvic sidewall lymphadenopathy. Reproductive: Unremarkable. Other: No intraperitoneal free fluid. Musculoskeletal: No worrisome lytic or sclerotic osseous abnormality. Small paraumbilical midline hernia contains small bowel without  complicating features. Hernia sac measures approximately 6.2 x 3.3 x 7.2 cm. IMPRESSION: 1. No acute findings in the abdomen or pelvis. No findings to explain the patient's history of left upper quadrant flank tenderness. 2. Paraumbilical midline ventral hernia contains small bowel without complicating features. 3. Tiny hiatal hernia. 4.  Aortic Atherosclerois (ICD10-170.0) Electronically Signed   By: Misty Stanley M.D.   On: 10/24/2018 16:28   Dg Chest 2 View  Result Date: 10/24/2018 CLINICAL DATA:  Weakness and shaking for 2 days. EXAM: CHEST - 2 VIEW COMPARISON:  08/15/2017 FINDINGS: Nodular opacity in the right apex is new in the interval. Patchy airspace opacity is seen in the peripheral right upper lobe. Left lung clear. No pleural effusion. Cardiopericardial silhouette is at upper limits of normal for size. The visualized bony structures of the thorax are intact. IMPRESSION: Patchy airspace disease right upper lobe suspicious for pneumonia. Nodular density in the right apex is new in the interval since prior study. CT chest without contrast recommended to further evaluate. Electronically Signed   By: Misty Stanley M.D.   On: 10/24/2018 12:34   Ct Head Wo Contrast  Result Date: 11/05/2018 CLINICAL DATA:  85 year old female with altered mental status. EXAM: CT HEAD WITHOUT CONTRAST TECHNIQUE: Contiguous axial images were obtained from the base of the skull through the vertex without intravenous contrast. COMPARISON:  Head CT dated 09/26/2018 FINDINGS: Brain: There is moderate age-related atrophy and chronic microvascular ischemic changes. Bilateral occipital old infarcts noted. There is no acute intracranial hemorrhage. No mass effect or midline shift no extra-axial fluid collection. Vascular: No hyperdense vessel or unexpected calcification. Skull: Normal. Negative for fracture or focal lesion. Sinuses/Orbits: No acute finding. Other: None IMPRESSION: 1. No acute intracranial hemorrhage. 2. Age-related  atrophy and chronic microvascular ischemic changes. Old bilateral occipital infarcts. Electronically Signed   By: Anner Crete M.D.   On: 11/05/2018 13:03   Dg Chest Port 1 View  Result Date: 10/27/2018 CLINICAL DATA:  85 year old female with shortness of breath EXAM: PORTABLE CHEST 1 VIEW COMPARISON:  Chest radiograph dated 10/24/2018 FINDINGS: Evaluation is limited due to patient positioning and superimposition of the mandible over the lung apices. Probable background of mild emphysema. Bibasilar atelectatic changes versus less likely infiltrate. Faint streaky density in the right upper lobe may correspond to the scarring seen the CT of 03/22/2015. No lobar consolidation, pleural effusion, or pneumothorax. The right costophrenic angle has been excluded from the image. The cardiac silhouette is within normal limits. Calcified mitral annulus. Loop recorder device. Degenerative changes of the spine and shoulders. Right axillary surgical clips. No acute osseous pathology. IMPRESSION: Bibasilar atelectatic changes versus less likely infiltrate. Electronically Signed   By: Anner Crete M.D.   On: 10/27/2018 01:32   Xr Clavicle Left  Result Date: 10/12/2018 2 views left shoulder shows mildly displaced lateral clavicle fracture which is nonarticular.  No other fractures identified.  The humeral head is well located.    Subjective: No issues this morning.  Discharge Exam: Vitals:   11/06/18 0552 11/06/18 1328  BP: (!) 162/58 (!) 168/64  Pulse: 62 67  Resp:  18 18  Temp: 98.2 F (36.8 C) 98 F (36.7 C)  SpO2: 100% 100%   Vitals:   11/05/18 1700 11/05/18 2327 11/06/18 0552 11/06/18 1328  BP:  (!) 139/107 (!) 162/58 (!) 168/64  Pulse:  65 62 67  Resp:  18 18 18   Temp:  98.4 F (36.9 C) 98.2 F (36.8 C) 98 F (36.7 C)  TempSrc:  Oral Oral Oral  SpO2:  100% 100% 100%  Weight: 73.9 kg  76.3 kg   Height: 4\' 11"  (1.499 m)       General: Pt is alert, awake, not in acute  distress Cardiovascular: RRR, S1/S2 +, no rubs, no gallops Respiratory: CTA bilaterally, no wheezing, no rhonchi Abdominal: Soft, NT, ND, bowel sounds + Extremities: no edema, no cyanosis    The results of significant diagnostics from this hospitalization (including imaging, microbiology, ancillary and laboratory) are listed below for reference.     Microbiology: Recent Results (from the past 240 hour(s))  Urine culture     Status: Abnormal (Preliminary result)   Collection Time: 11/04/18 10:34 PM   Specimen: Urine, Clean Catch  Result Value Ref Range Status   Specimen Description   Final    Urine Performed at [facility] [address], [place], [place] [address]    Special Requests   Final    NONE Performed at [facility], [place] [address], [place], [place] [address]    Culture (A)  Final    50,000 COLONIES/mL GRAM NEGATIVE RODS IDENTIFICATION AND SUSCEPTIBILITIES TO FOLLOW Performed at [facility], [place] [address], [place], [place] [address]    Report Status PENDING  Incomplete  SARS CORONAVIRUS 2 (TAT 6-24 HRS) Nasopharyngeal Nasopharyngeal Swab     Status: None   Collection Time: 11/05/18 12:47 AM   Specimen: Nasopharyngeal Swab  Result Value Ref Range Status   SARS Coronavirus 2 NEGATIVE NEGATIVE Final    Comment: (NOTE) SARS-CoV-2 target nucleic acids are NOT DETECTED. The SARS-CoV-2 RNA is generally detectable in upper and lower respiratory specimens during the acute phase of infection. Negative results do not preclude SARS-CoV-2 infection, do not rule out co-infections with other pathogens, and should not be used as the sole basis for treatment or other patient management decisions. Negative results must be combined with clinical observations, patient history, and epidemiological information. The expected result is Negative. Fact Sheet for Patients: SugarRoll.be Fact Sheet for Healthcare  Providers: https://www.woods-mathews.com/ This test is not yet approved or cleared by the [place] FDA and  has been authorized for detection and/or diagnosis of SARS-CoV-2 by FDA under an Emergency Use Authorization (EUA). This EUA will remain  in effect (meaning this test can be used) for the duration of the COVID-19 declaration under Section 56 4(b)(1) of the Act, 21 U.S.C. section 360bbb-3(b)(1), unless the authorization is terminated or revoked sooner. Performed at [facility], [place] [address] [place], [place] [address]      Labs: BNP (last 3 results) No results for input(s): BNP in the last 8760 hours. Basic Metabolic Panel: Recent Labs  Lab 11/04/18 2021 11/05/18 0500 11/05/18 1159 11/06/18 0337  NA 143 142 143 144  K 4.1 3.5 4.2 3.9  CL 101 102 104 110  CO2 32 29 30 30   GLUCOSE 200* 214* 208* 174*  BUN 57* 54* 48* 43*  CREATININE 2.02* 1.94* 2.03* 1.91*  CALCIUM 13.0* 12.4* 11.4* 10.4*  MG 2.3  --   --   --  PHOS 4.8*  --   --   --    Liver Function Tests: Recent Labs  Lab 11/04/18 2021  AST 14*  ALT 15  ALKPHOS 88  BILITOT 0.3  PROT 6.6  ALBUMIN 3.5   No results for input(s): LIPASE, AMYLASE in the last 168 hours. No results for input(s): AMMONIA in the last 168 hours. CBC: Recent Labs  Lab 11/04/18 2021 11/05/18 0500  WBC 7.9 11.8*  NEUTROABS 4.7  --   HGB 10.3* 10.6*  HCT 32.7* 33.1*  MCV 103.2* 101.8*  PLT 218 243   Cardiac Enzymes: No results for input(s): CKTOTAL, CKMB, CKMBINDEX, TROPONINI in the last 168 hours. BNP: Invalid input(s): POCBNP CBG: Recent Labs  Lab 11/05/18 1139 11/05/18 1645 11/05/18 2118 11/06/18 0737 11/06/18 1133  GLUCAP 194* 125* 162* 150* 228*   D-Dimer No results for input(s): DDIMER in the last 72 hours. Hgb A1c No results for input(s): HGBA1C in the last 72 hours. Lipid Profile No results for input(s): CHOL, HDL, LDLCALC, TRIG, CHOLHDL, LDLDIRECT in the last 72  hours. Thyroid function studies Recent Labs    11/04/18 2021  TSH 1.270  T3FREE 2.0   Anemia work up Recent Labs    11/04/18 2026  VITAMINB12 673   Urinalysis    Component Value Date/Time   COLORURINE STRAW (A) 11/04/2018 2234   APPEARANCEUR CLEAR 11/04/2018 2234   LABSPEC 1.006 11/04/2018 2234   PHURINE 7.0 11/04/2018 2234   GLUCOSEU 50 (A) 11/04/2018 2234   HGBUR NEGATIVE 11/04/2018 2234   [place] 11/04/2018 2234   KETONESUR NEGATIVE 11/04/2018 2234   PROTEINUR 30 (A) 11/04/2018 2234   UROBILINOGEN 0.2 09/13/2013 0523   NITRITE NEGATIVE 11/04/2018 2234   LEUKOCYTESUR NEGATIVE 11/04/2018 2234   Sepsis Labs Invalid input(s): PROCALCITONIN,  WBC,  LACTICIDVEN Microbiology Recent Results (from the past 240 hour(s))  Urine culture     Status: Abnormal (Preliminary result)   Collection Time: 11/04/18 10:34 PM   Specimen: Urine, Clean Catch  Result Value Ref Range Status   Specimen Description   Final    Urine Performed at [facility], [place] [address] [place], [place] [address]    Special Requests   Final    NONE Performed at [facility], [place] [address], [place], [place] [address]    Culture (A)  Final    50,000 COLONIES/mL GRAM NEGATIVE RODS IDENTIFICATION AND SUSCEPTIBILITIES TO FOLLOW Performed at [facility], [place] [address] [place], [place] [address]    Report Status PENDING  Incomplete  SARS CORONAVIRUS 2 (TAT 6-24 HRS) Nasopharyngeal Nasopharyngeal Swab     Status: None   Collection Time: 11/05/18 12:47 AM   Specimen: Nasopharyngeal Swab  Result Value Ref Range Status   SARS Coronavirus 2 NEGATIVE NEGATIVE Final    Comment: (NOTE) SARS-CoV-2 target nucleic acids are NOT DETECTED. The SARS-CoV-2 RNA is generally detectable in upper and lower respiratory specimens during the acute phase of infection. Negative results do not preclude SARS-CoV-2 infection, do not rule out co-infections with other  pathogens, and should not be used as the sole basis for treatment or other patient management decisions. Negative results must be combined with clinical observations, patient history, and epidemiological information. The expected result is Negative. Fact Sheet for Patients: SugarRoll.be Fact Sheet for Healthcare Providers: https://www.woods-mathews.com/ This test is not yet approved or cleared by the [place] FDA and  has been authorized for detection and/or diagnosis of SARS-CoV-2 by FDA under an Emergency  Use Authorization (EUA). This EUA will remain  in effect (meaning this test can be used) for the duration of the COVID-19 declaration under Section 56 4(b)(1) of the Act, 21 U.S.C. section 360bbb-3(b)(1), unless the authorization is terminated or revoked sooner. Performed at [facility], [place] [address] [place], [place] [address]     SIGNED:   Cordelia Poche, MD [facility] 11/06/2018, 1:47 PM

## 2018-11-08 LAB — URINE CULTURE: Culture: 50000 — AB

## 2018-11-09 NOTE — Telephone Encounter (Signed)
follow up as scheduled.

## 2018-11-11 ENCOUNTER — Encounter: Payer: Self-pay | Admitting: Physician Assistant

## 2018-11-11 ENCOUNTER — Ambulatory Visit (INDEPENDENT_AMBULATORY_CARE_PROVIDER_SITE_OTHER): Payer: Medicare Other | Admitting: Physician Assistant

## 2018-11-11 VITALS — Ht 59.0 in | Wt 168.0 lb

## 2018-11-11 DIAGNOSIS — M898X1 Other specified disorders of bone, shoulder: Secondary | ICD-10-CM | POA: Diagnosis not present

## 2018-11-11 NOTE — Progress Notes (Signed)
HPI [name] returns today almost 2 months status post left lateral clavicle fracture.  She is been out of the sling almost 3 weeks now.  Has occasional pain.  She is doing some range of motion with the arm.  States the biggest problem she has is trying to get up from a seated position and pushing off with her arms.  Physical exam: Left clavicle no tenting the skin tenderness over the lateral aspect of the clavicle.  Left shoulder fluid range of motion with external and internal rotation with minimal discomfort.  No extremes of motion are attempted.  Left hand neurovascular intact.  Impression: 2 months status post left lateral clavicle fracture treated conservatively.  Plan: At this point time she can work with physical therapy for range of motion strengthening of the left shoulder activities as tolerated.  She will follow-up with [place] if there is any questions or concerns.

## 2018-11-13 DIAGNOSIS — I129 Hypertensive chronic kidney disease with stage 1 through stage 4 chronic kidney disease, or unspecified chronic kidney disease: Secondary | ICD-10-CM | POA: Diagnosis not present

## 2018-11-13 DIAGNOSIS — Z79899 Other long term (current) drug therapy: Secondary | ICD-10-CM | POA: Diagnosis not present

## 2018-11-13 DIAGNOSIS — N184 Chronic kidney disease, stage 4 (severe): Secondary | ICD-10-CM | POA: Diagnosis not present

## 2018-11-15 DIAGNOSIS — R269 Unspecified abnormalities of gait and mobility: Secondary | ICD-10-CM | POA: Diagnosis not present

## 2018-11-16 DIAGNOSIS — H532 Diplopia: Secondary | ICD-10-CM | POA: Diagnosis not present

## 2018-11-16 DIAGNOSIS — Z961 Presence of intraocular lens: Secondary | ICD-10-CM | POA: Diagnosis not present

## 2018-11-16 DIAGNOSIS — E119 Type 2 diabetes mellitus without complications: Secondary | ICD-10-CM | POA: Diagnosis not present

## 2018-11-17 ENCOUNTER — Ambulatory Visit (INDEPENDENT_AMBULATORY_CARE_PROVIDER_SITE_OTHER): Payer: Medicare Other | Admitting: Sports Medicine

## 2018-11-17 ENCOUNTER — Other Ambulatory Visit: Payer: Self-pay

## 2018-11-17 ENCOUNTER — Encounter: Payer: Self-pay | Admitting: Sports Medicine

## 2018-11-17 VITALS — BP 128/57 | HR 55 | Resp 16

## 2018-11-17 DIAGNOSIS — B351 Tinea unguium: Secondary | ICD-10-CM | POA: Diagnosis not present

## 2018-11-17 DIAGNOSIS — I739 Peripheral vascular disease, unspecified: Secondary | ICD-10-CM | POA: Diagnosis not present

## 2018-11-17 DIAGNOSIS — M79674 Pain in right toe(s): Secondary | ICD-10-CM

## 2018-11-17 DIAGNOSIS — M79675 Pain in left toe(s): Secondary | ICD-10-CM | POA: Diagnosis not present

## 2018-11-17 DIAGNOSIS — E119 Type 2 diabetes mellitus without complications: Secondary | ICD-10-CM

## 2018-11-17 NOTE — Progress Notes (Signed)
Subjective: Sabrina [name] is a 85 y.o. female patient with history of diabetes who presents to office today complaining of long,mildly painful nails  while in shoes; unable to trim. Patient states that the glucose reading this morning was 171 mg/dl. Patient denies any new changes in medication or new problems.  Last A1c 7.3 recorded on 10/25/2018.   Patient is assisted by home aide.  Review of Systems  All other systems reviewed and are negative.    Patient Active Problem List   Diagnosis Date Noted  . Hypercalcemia 11/04/2018  . History of stroke 11/04/2018  . AF (paroxysmal atrial fibrillation) ([place]) 11/04/2018  . Acute kidney injury ([place])   . Stage IV breast cancer in female [facility])   . Acute metabolic encephalopathy 36/64/4034  . Personal history of DVT and pulm embolus 10/26/2018  . UTI (urinary tract infection) 10/24/2018  . Clavicle fracture 09/26/2018  . Blurry vision, bilateral   . Acute blood loss anemia   . History of breast cancer 05/30/2017  . Physical debility 05/30/2017  . Occipital infarction ([place]) 05/30/2017  . Pressure injury of skin 05/28/2017  . Fall at home 05/27/2017  . CKD (chronic kidney disease), stage IV ([place]) 05/27/2017  . High cholesterol 05/27/2017  . DM (diabetes mellitus) ([place]) 05/27/2017  . Hypertension 05/27/2017  . Gout 05/27/2017  . Elevated troponin 05/27/2017  . Lipoma of lower extremity 09/12/2014  . Abnormal x-ray 03/15/2014  . Chronic diastolic congestive heart failure ([place]) 02/23/2014  . Osteopenia 11/30/2013  . Hypoglycemia 09/13/2013  . Acute respiratory failure with hypoxia ([place]) 06/14/2013  . History of pulmonary embolism 06/10/2013  . Nausea and vomiting 06/10/2013  . Accelerated hypertension 06/10/2013  . Aortic stenosis 04/22/2013  . Edema leg 04/22/2013  . Breast cancer of upper-outer quadrant of right female breast ([place]); in remission 03/19/2013   Current Outpatient Medications on File Prior to Visit  Medication Sig  Dispense Refill  . allopurinol (ZYLOPRIM) 100 MG tablet Take 100 mg by mouth daily.     [name] apixaban (ELIQUIS) 2.5 MG TABS tablet Take 1 tablet (2.5 mg total) by mouth 2 (two) times daily. 60 tablet 6  . cholecalciferol (VITAMIN D) 1000 UNITS tablet Take 1,000 Units by mouth daily.    . cloNIDine (CATAPRES) 0.2 MG tablet Take 0.2 mg by mouth 2 (two) times daily.    [name] denosumab (PROLIA) 60 MG/ML SOSY injection Inject 60 mg into the skin every 6 (six) months.    . furosemide (LASIX) 40 MG tablet Take 40 mg by mouth daily.    [name] gabapentin (NEURONTIN) 100 MG capsule Take 3 capsules (300 mg total) by mouth at bedtime. Take 200 mg by mouth twice a day and take 300 mg by mouth at bedtime    . glipiZIDE (GLUCOTROL) 5 MG tablet Take 5 mg by mouth 2 (two) times daily before a meal.     . hydrALAZINE (APRESOLINE) 10 MG tablet Take 1 tablet (10 mg total) by mouth 3 (three) times daily. 90 tablet 2  . hydrocortisone cream 1 % Apply 1 application topically daily as needed for itching.    . polyethylene glycol (MIRALAX / GLYCOLAX) packet Take 17 g by mouth daily.     . traMADol (ULTRAM) 50 MG tablet Take 50 mg by mouth 2 (two) times daily as needed for moderate pain.      No current facility-administered medications on file prior to visit.    Allergies  Allergen Reactions  . Beta Adrenergic Blockers  Objective: General: Patient is awake, alert, and oriented x 3 and in no acute distress.  Integument: Skin is warm, dry and supple bilateral. Nails are tender, long, thickened and  dystrophic with subungual debris, consistent with onychomycosis, 1-5 bilateral. No signs of infection. No open lesions or preulcerative lesions present bilateral. Remaining integument unremarkable.  Vasculature:  Dorsalis Pedis pulse 1/4 bilateral. Posterior Tibial pulse  0/4 bilateral.  Capillary fill time <5 sec 1-5 bilateral. Scant hair growth to the level of the digits. Temperature gradient within normal limits. No  varicosities present bilateral. No edema present bilateral.   Neurology: The patient has intact sensation measured with a 5.07/10g Semmes Weinstein Monofilament at all pedal sites bilateral. Vibratory sensation diminished bilateral with tuning fork. No Babinski sign present bilateral.   Musculoskeletal:Asymptomatic bunion and hammertoe pedal deformities noted bilateral. Muscular strength 5/5 in all lower extremity muscular groups bilateral without pain on range of motion. No tenderness with calf compression bilateral.  Assessment and Plan: Problem List Items Addressed This Visit    None    Visit Diagnoses    Pain due to onychomycosis of toenails of both feet    -  Primary   PVD (peripheral vascular disease) ([place])       Diabetes mellitus without complication ([place])         -Examined patient. -Discussed and educated patient on diabetic foot care, especially with  regards to the vascular, neurological and musculoskeletal systems.  -Stressed the importance of good glycemic control and the detriment of not  controlling glucose levels in relation to the foot. -Mechanically debrided all nails 1-5 bilateral using sterile nail nipper and filed with dremel without incident -There is elevation of legs to assist with edema control -Answered all patient questions -Patient to return  in 3 months for at risk foot care -Patient advised to call the office if any problems or questions arise in the meantime.  Landis Martins, DPM

## 2018-11-20 ENCOUNTER — Ambulatory Visit (INDEPENDENT_AMBULATORY_CARE_PROVIDER_SITE_OTHER): Payer: Medicare Other | Admitting: *Deleted

## 2018-11-20 DIAGNOSIS — I639 Cerebral infarction, unspecified: Secondary | ICD-10-CM

## 2018-11-21 LAB — CUP PACEART REMOTE DEVICE CHECK
Date Time Interrogation Session: 20200918204042
Implantable Pulse Generator Implant Date: 20190614

## 2018-11-23 NOTE — Progress Notes (Signed)
[facility] Summary Report / Loop Recorder 

## 2018-11-25 DIAGNOSIS — N183 Chronic kidney disease, stage 3 (moderate): Secondary | ICD-10-CM | POA: Diagnosis not present

## 2018-11-25 DIAGNOSIS — I129 Hypertensive chronic kidney disease with stage 1 through stage 4 chronic kidney disease, or unspecified chronic kidney disease: Secondary | ICD-10-CM | POA: Diagnosis not present

## 2018-11-25 DIAGNOSIS — M109 Gout, unspecified: Secondary | ICD-10-CM | POA: Diagnosis not present

## 2018-11-25 DIAGNOSIS — E1122 Type 2 diabetes mellitus with diabetic chronic kidney disease: Secondary | ICD-10-CM | POA: Diagnosis not present

## 2018-11-25 DIAGNOSIS — D892 Hypergammaglobulinemia, unspecified: Secondary | ICD-10-CM | POA: Diagnosis not present

## 2018-12-04 ENCOUNTER — Telehealth: Payer: Self-pay

## 2018-12-04 NOTE — Telephone Encounter (Signed)
Pt caregiver states the pt monitor light been on for 2 days. I told her to unplug the monitor for 20 seconds then plug it back in. I told her if that do not help to call [facility] tech support for additional help.

## 2018-12-15 DIAGNOSIS — D6869 Other thrombophilia: Secondary | ICD-10-CM | POA: Diagnosis not present

## 2018-12-15 DIAGNOSIS — D472 Monoclonal gammopathy: Secondary | ICD-10-CM | POA: Diagnosis not present

## 2018-12-15 DIAGNOSIS — N184 Chronic kidney disease, stage 4 (severe): Secondary | ICD-10-CM | POA: Diagnosis not present

## 2018-12-15 DIAGNOSIS — E1121 Type 2 diabetes mellitus with diabetic nephropathy: Secondary | ICD-10-CM | POA: Diagnosis not present

## 2018-12-15 DIAGNOSIS — I480 Paroxysmal atrial fibrillation: Secondary | ICD-10-CM | POA: Diagnosis not present

## 2018-12-15 DIAGNOSIS — R269 Unspecified abnormalities of gait and mobility: Secondary | ICD-10-CM | POA: Diagnosis not present

## 2018-12-23 ENCOUNTER — Ambulatory Visit (INDEPENDENT_AMBULATORY_CARE_PROVIDER_SITE_OTHER): Payer: Medicare Other | Admitting: *Deleted

## 2018-12-23 DIAGNOSIS — I480 Paroxysmal atrial fibrillation: Secondary | ICD-10-CM | POA: Diagnosis not present

## 2018-12-23 DIAGNOSIS — I5032 Chronic diastolic (congestive) heart failure: Secondary | ICD-10-CM

## 2018-12-24 LAB — CUP PACEART REMOTE DEVICE CHECK
Date Time Interrogation Session: 20201021203841
Implantable Pulse Generator Implant Date: 20190614

## 2019-01-05 ENCOUNTER — Other Ambulatory Visit: Payer: Self-pay

## 2019-01-05 DIAGNOSIS — Z20822 Contact with and (suspected) exposure to covid-19: Secondary | ICD-10-CM

## 2019-01-05 NOTE — Progress Notes (Signed)
[facility] Summary Report / Loop Recorder 

## 2019-01-06 LAB — NOVEL CORONAVIRUS, NAA: SARS-CoV-2, NAA: NOT DETECTED

## 2019-01-15 DIAGNOSIS — R269 Unspecified abnormalities of gait and mobility: Secondary | ICD-10-CM | POA: Diagnosis not present

## 2019-01-25 ENCOUNTER — Ambulatory Visit (INDEPENDENT_AMBULATORY_CARE_PROVIDER_SITE_OTHER): Payer: Medicare Other | Admitting: *Deleted

## 2019-01-25 DIAGNOSIS — Z8673 Personal history of transient ischemic attack (TIA), and cerebral infarction without residual deficits: Secondary | ICD-10-CM

## 2019-01-26 LAB — CUP PACEART REMOTE DEVICE CHECK
Date Time Interrogation Session: 20201124070914
Implantable Pulse Generator Implant Date: 20190614

## 2019-02-14 DIAGNOSIS — R269 Unspecified abnormalities of gait and mobility: Secondary | ICD-10-CM | POA: Diagnosis not present

## 2019-02-16 ENCOUNTER — Other Ambulatory Visit: Payer: Self-pay

## 2019-02-16 ENCOUNTER — Ambulatory Visit: Payer: Medicare Other | Admitting: Sports Medicine

## 2019-02-16 ENCOUNTER — Encounter: Payer: Self-pay | Admitting: Sports Medicine

## 2019-02-16 DIAGNOSIS — M79675 Pain in left toe(s): Secondary | ICD-10-CM

## 2019-02-16 DIAGNOSIS — E119 Type 2 diabetes mellitus without complications: Secondary | ICD-10-CM | POA: Diagnosis not present

## 2019-02-16 DIAGNOSIS — B351 Tinea unguium: Secondary | ICD-10-CM

## 2019-02-16 DIAGNOSIS — M79674 Pain in right toe(s): Secondary | ICD-10-CM

## 2019-02-16 DIAGNOSIS — I739 Peripheral vascular disease, unspecified: Secondary | ICD-10-CM

## 2019-02-16 NOTE — Progress Notes (Signed)
Subjective: Sabrina [name] is a 85 y.o. female patient with history of diabetes who presents to office today complaining of long,mildly painful nails while in shoes; unable to trim. Patient states that the glucose reading this morning was 156 mg/dl. Patient denies any new changes in medication or new problems, still on Eliquis.  Last A1c 7.3 recorded on 10/25/2018.   Patient reports that her husband died the day before her birthday on [birth year] and is having a hard time, crying everyday.   Patient is assisted by home aide.   Patient Active Problem List   Diagnosis Date Noted  . Hypercalcemia 11/04/2018  . History of stroke 11/04/2018  . AF (paroxysmal atrial fibrillation) ([place]) 11/04/2018  . Acute kidney injury ([place])   . Stage IV breast cancer in female [facility])   . Acute metabolic encephalopathy 25/42/7062  . Personal history of DVT and pulm embolus 10/26/2018  . Personal history of other venous thrombosis and embolism 10/26/2018  . UTI (urinary tract infection) 10/24/2018  . Clavicle fracture 09/26/2018  . Degeneration of lumbar intervertebral disc 10/01/2017  . Blurry vision, bilateral   . Acute blood loss anemia   . History of breast cancer 05/30/2017  . Physical debility 05/30/2017  . Occipital infarction ([place]) 05/30/2017  . Pressure injury of skin 05/28/2017  . Fall at home 05/27/2017  . CKD (chronic kidney disease), stage IV ([place]) 05/27/2017  . High cholesterol 05/27/2017  . DM (diabetes mellitus) ([place]) 05/27/2017  . Hypertension 05/27/2017  . Gout 05/27/2017  . Elevated troponin 05/27/2017  . Lumbar radiculopathy 05/15/2017  . Pain of right calf 04/30/2017  . Asymmetrical left sensorineural hearing loss 02/14/2016  . Impacted cerumen of left ear 02/14/2016  . Lipoma of lower extremity 09/12/2014  . Abnormal x-ray 03/15/2014  . Chronic diastolic congestive heart failure ([place]) 02/23/2014  . Candidiasis of skin 01/13/2014  . Osteopenia 11/30/2013  . Hypoglycemia  09/13/2013  . Acute respiratory failure with hypoxia ([place]) 06/14/2013  . History of pulmonary embolism 06/10/2013  . Nausea and vomiting 06/10/2013  . Accelerated hypertension 06/10/2013  . Candidiasis of vagina 05/19/2013  . Aortic stenosis 04/22/2013  . Edema leg 04/22/2013  . Breast cancer of upper-outer quadrant of right female breast ([place]); in remission 03/19/2013   Current Outpatient Medications on File Prior to Visit  Medication Sig Dispense Refill  . allopurinol (ZYLOPRIM) 100 MG tablet Take 100 mg by mouth daily.     [name] apixaban (ELIQUIS) 2.5 MG TABS tablet Take 1 tablet (2.5 mg total) by mouth 2 (two) times daily. 60 tablet 6  . cholecalciferol (VITAMIN D) 1000 UNITS tablet Take 1,000 Units by mouth daily.    . cloNIDine (CATAPRES) 0.2 MG tablet Take 0.2 mg by mouth 2 (two) times daily.    [name] denosumab (PROLIA) 60 MG/ML SOSY injection Inject 60 mg into the skin every 6 (six) months.    . furosemide (LASIX) 40 MG tablet Take 40 mg by mouth daily.    [name] gabapentin (NEURONTIN) 100 MG capsule Take 3 capsules (300 mg total) by mouth at bedtime. Take 200 mg by mouth twice a day and take 300 mg by mouth at bedtime    . glipiZIDE (GLUCOTROL) 5 MG tablet Take 5 mg by mouth 2 (two) times daily before a meal.     . hydrocortisone cream 1 % Apply 1 application topically daily as needed for itching.    . polyethylene glycol (MIRALAX / GLYCOLAX) packet Take 17 g by mouth daily.     [name]  traMADol (ULTRAM) 50 MG tablet Take 50 mg by mouth 2 (two) times daily as needed for moderate pain.     . hydrALAZINE (APRESOLINE) 10 MG tablet Take 1 tablet (10 mg total) by mouth 3 (three) times daily. 90 tablet 2   No current facility-administered medications on file prior to visit.   Allergies  Allergen Reactions  . Beta Adrenergic Blockers     Objective: General: Patient is awake, alert, and oriented x 3 and in no acute distress.  Integument: Skin is warm, dry and supple bilateral. Nails are tender,  long, thickened and dystrophic with subungual debris, consistent with onychomycosis, 1-5 bilateral. No signs of infection. No open lesions or preulcerative lesions present bilateral. Remaining integument unremarkable.  Vasculature:  Dorsalis Pedis pulse 1/4 bilateral. Posterior Tibial pulse  0/4 bilateral. Capillary fill time <5 sec 1-5 bilateral. Scant hair growth to the level of the digits.Temperature gradient within normal limits. No varicosities present bilateral. No edema present bilateral.   Neurology: The patient has intact sensation measured with a 5.07/10g Semmes Weinstein Monofilament at all pedal sites bilateral. Vibratory sensation diminished bilateral with tuning fork. No Babinski sign present bilateral.   Musculoskeletal:Asymptomatic bunion and hammertoe pedal deformities noted bilateral. Muscular strength 5/5 in all lower extremity muscular groups bilateral without pain on range of motion. No tenderness with calf compression bilateral.  Assessment and Plan: Problem List Items Addressed This Visit    None    Visit Diagnoses    Pain due to onychomycosis of toenails of both feet    -  Primary   PVD (peripheral vascular disease) ([place])       Diabetes mellitus without complication ([place])         -Examined patient. -Re-Discussed and educated patient on diabetic foot care, especially with  regards to the vascular, neurological and musculoskeletal systems. t. -Mechanically debrided all nails 1-5 bilateral using sterile nail nipper and filed with dremel without incident -Patient to return  in 3 months for at risk foot care -Patient advised to call the office if any problems or questions arise in the meantime.  Landis Martins, DPM

## 2019-02-21 NOTE — Addendum Note (Signed)
Addended by: Patsey Berthold on: 02/21/2019 07:35 PM   Modules accepted: Level of Service

## 2019-02-25 ENCOUNTER — Ambulatory Visit (INDEPENDENT_AMBULATORY_CARE_PROVIDER_SITE_OTHER): Payer: Medicare Other | Admitting: *Deleted

## 2019-02-25 DIAGNOSIS — I639 Cerebral infarction, unspecified: Secondary | ICD-10-CM

## 2019-02-25 LAB — CUP PACEART REMOTE DEVICE CHECK
Date Time Interrogation Session: 20201223230819
Implantable Pulse Generator Implant Date: 20190614

## 2019-02-28 NOTE — Progress Notes (Signed)
ILR remote 

## 2019-03-01 ENCOUNTER — Other Ambulatory Visit: Payer: Self-pay | Admitting: Geriatric Medicine

## 2019-03-01 DIAGNOSIS — Z1231 Encounter for screening mammogram for malignant neoplasm of breast: Secondary | ICD-10-CM

## 2019-03-16 DIAGNOSIS — S3091XA Unspecified superficial injury of lower back and pelvis, initial encounter: Secondary | ICD-10-CM | POA: Diagnosis not present

## 2019-03-16 DIAGNOSIS — W19XXXA Unspecified fall, initial encounter: Secondary | ICD-10-CM | POA: Diagnosis not present

## 2019-03-16 DIAGNOSIS — R269 Unspecified abnormalities of gait and mobility: Secondary | ICD-10-CM | POA: Diagnosis not present

## 2019-03-17 DIAGNOSIS — R269 Unspecified abnormalities of gait and mobility: Secondary | ICD-10-CM | POA: Diagnosis not present

## 2019-03-18 DIAGNOSIS — M8580 Other specified disorders of bone density and structure, unspecified site: Secondary | ICD-10-CM | POA: Diagnosis not present

## 2019-03-18 DIAGNOSIS — I129 Hypertensive chronic kidney disease with stage 1 through stage 4 chronic kidney disease, or unspecified chronic kidney disease: Secondary | ICD-10-CM | POA: Diagnosis not present

## 2019-03-18 DIAGNOSIS — I480 Paroxysmal atrial fibrillation: Secondary | ICD-10-CM | POA: Diagnosis not present

## 2019-03-18 DIAGNOSIS — S301XXD Contusion of abdominal wall, subsequent encounter: Secondary | ICD-10-CM | POA: Diagnosis not present

## 2019-03-18 DIAGNOSIS — K439 Ventral hernia without obstruction or gangrene: Secondary | ICD-10-CM | POA: Diagnosis not present

## 2019-03-18 DIAGNOSIS — C7989 Secondary malignant neoplasm of other specified sites: Secondary | ICD-10-CM | POA: Diagnosis not present

## 2019-03-18 DIAGNOSIS — N184 Chronic kidney disease, stage 4 (severe): Secondary | ICD-10-CM | POA: Diagnosis not present

## 2019-03-18 DIAGNOSIS — I350 Nonrheumatic aortic (valve) stenosis: Secondary | ICD-10-CM | POA: Diagnosis not present

## 2019-03-18 DIAGNOSIS — C50911 Malignant neoplasm of unspecified site of right female breast: Secondary | ICD-10-CM | POA: Diagnosis not present

## 2019-03-18 DIAGNOSIS — W19XXXD Unspecified fall, subsequent encounter: Secondary | ICD-10-CM | POA: Diagnosis not present

## 2019-03-18 DIAGNOSIS — E1122 Type 2 diabetes mellitus with diabetic chronic kidney disease: Secondary | ICD-10-CM | POA: Diagnosis not present

## 2019-03-18 DIAGNOSIS — D472 Monoclonal gammopathy: Secondary | ICD-10-CM | POA: Diagnosis not present

## 2019-03-18 DIAGNOSIS — Z6832 Body mass index (BMI) 32.0-32.9, adult: Secondary | ICD-10-CM | POA: Diagnosis not present

## 2019-03-18 DIAGNOSIS — E7800 Pure hypercholesterolemia, unspecified: Secondary | ICD-10-CM | POA: Diagnosis not present

## 2019-03-25 DIAGNOSIS — T300 Burn of unspecified body region, unspecified degree: Secondary | ICD-10-CM | POA: Diagnosis not present

## 2019-03-26 DIAGNOSIS — M5416 Radiculopathy, lumbar region: Secondary | ICD-10-CM | POA: Diagnosis not present

## 2019-03-29 ENCOUNTER — Ambulatory Visit (INDEPENDENT_AMBULATORY_CARE_PROVIDER_SITE_OTHER): Payer: Medicare Other | Admitting: *Deleted

## 2019-03-29 DIAGNOSIS — I639 Cerebral infarction, unspecified: Secondary | ICD-10-CM

## 2019-03-29 LAB — CUP PACEART REMOTE DEVICE CHECK
Date Time Interrogation Session: 20210124233947
Implantable Pulse Generator Implant Date: 20190614

## 2019-04-12 ENCOUNTER — Other Ambulatory Visit: Payer: Self-pay

## 2019-04-12 ENCOUNTER — Ambulatory Visit: Payer: Medicare Other

## 2019-04-12 ENCOUNTER — Ambulatory Visit
Admission: RE | Admit: 2019-04-12 | Discharge: 2019-04-12 | Disposition: A | Discharge status: Home / Self Care | Payer: Medicare Other | Source: Ambulatory Visit | Attending: Geriatric Medicine | Admitting: Geriatric Medicine

## 2019-04-12 DIAGNOSIS — Z1231 Encounter for screening mammogram for malignant neoplasm of breast: Secondary | ICD-10-CM | POA: Diagnosis not present

## 2019-04-15 ENCOUNTER — Ambulatory Visit: Payer: Medicare Other | Attending: Internal Medicine

## 2019-04-15 DIAGNOSIS — Z23 Encounter for immunization: Secondary | ICD-10-CM | POA: Insufficient documentation

## 2019-04-15 NOTE — Progress Notes (Signed)
   Covid-19 Vaccination Clinic  Name:  Sabrina [name]    MRN: [id number] DOB: 02-Mar-1927  04/15/2019  [name] was observed post Covid-19 immunization for 15 minutes without incidence. She was provided with Vaccine Information Sheet and instruction to access the V-Safe system.   [name] was instructed to call 911 with any severe reactions post vaccine: [name] Difficulty breathing  . Swelling of your face and throat  . A fast heartbeat  . A bad rash all over your body  . Dizziness and weakness    Immunizations Administered    Name Date Dose VIS Date Route   Pfizer COVID-19 Vaccine 04/15/2019  2:15 PM 0.3 mL 02/12/2019 Intramuscular   Manufacturer: [place]   Lot: [id number]   [place]: [address]-9

## 2019-04-17 DIAGNOSIS — M8580 Other specified disorders of bone density and structure, unspecified site: Secondary | ICD-10-CM | POA: Diagnosis not present

## 2019-04-17 DIAGNOSIS — S301XXD Contusion of abdominal wall, subsequent encounter: Secondary | ICD-10-CM | POA: Diagnosis not present

## 2019-04-17 DIAGNOSIS — E1122 Type 2 diabetes mellitus with diabetic chronic kidney disease: Secondary | ICD-10-CM | POA: Diagnosis not present

## 2019-04-17 DIAGNOSIS — K439 Ventral hernia without obstruction or gangrene: Secondary | ICD-10-CM | POA: Diagnosis not present

## 2019-04-17 DIAGNOSIS — I480 Paroxysmal atrial fibrillation: Secondary | ICD-10-CM | POA: Diagnosis not present

## 2019-04-17 DIAGNOSIS — C7989 Secondary malignant neoplasm of other specified sites: Secondary | ICD-10-CM | POA: Diagnosis not present

## 2019-04-17 DIAGNOSIS — R269 Unspecified abnormalities of gait and mobility: Secondary | ICD-10-CM | POA: Diagnosis not present

## 2019-04-17 DIAGNOSIS — W19XXXD Unspecified fall, subsequent encounter: Secondary | ICD-10-CM | POA: Diagnosis not present

## 2019-04-17 DIAGNOSIS — C50911 Malignant neoplasm of unspecified site of right female breast: Secondary | ICD-10-CM | POA: Diagnosis not present

## 2019-04-17 DIAGNOSIS — I350 Nonrheumatic aortic (valve) stenosis: Secondary | ICD-10-CM | POA: Diagnosis not present

## 2019-04-17 DIAGNOSIS — E7800 Pure hypercholesterolemia, unspecified: Secondary | ICD-10-CM | POA: Diagnosis not present

## 2019-04-17 DIAGNOSIS — D472 Monoclonal gammopathy: Secondary | ICD-10-CM | POA: Diagnosis not present

## 2019-04-17 DIAGNOSIS — Z6832 Body mass index (BMI) 32.0-32.9, adult: Secondary | ICD-10-CM | POA: Diagnosis not present

## 2019-04-17 DIAGNOSIS — I129 Hypertensive chronic kidney disease with stage 1 through stage 4 chronic kidney disease, or unspecified chronic kidney disease: Secondary | ICD-10-CM | POA: Diagnosis not present

## 2019-04-17 DIAGNOSIS — N184 Chronic kidney disease, stage 4 (severe): Secondary | ICD-10-CM | POA: Diagnosis not present

## 2019-04-19 ENCOUNTER — Other Ambulatory Visit (HOSPITAL_COMMUNITY): Payer: Self-pay | Admitting: Physician Assistant

## 2019-04-29 ENCOUNTER — Ambulatory Visit: Payer: Medicare Other

## 2019-04-29 ENCOUNTER — Ambulatory Visit (INDEPENDENT_AMBULATORY_CARE_PROVIDER_SITE_OTHER): Payer: Medicare Other | Admitting: *Deleted

## 2019-04-29 DIAGNOSIS — I639 Cerebral infarction, unspecified: Secondary | ICD-10-CM

## 2019-04-29 LAB — CUP PACEART REMOTE DEVICE CHECK
Date Time Interrogation Session: 20210225000829
Implantable Pulse Generator Implant Date: 20190614

## 2019-04-29 NOTE — Progress Notes (Signed)
ILR Remote 

## 2019-05-04 ENCOUNTER — Other Ambulatory Visit: Payer: Self-pay | Admitting: Oncology

## 2019-05-04 DIAGNOSIS — M818 Other osteoporosis without current pathological fracture: Secondary | ICD-10-CM

## 2019-05-04 DIAGNOSIS — M810 Age-related osteoporosis without current pathological fracture: Secondary | ICD-10-CM | POA: Insufficient documentation

## 2019-05-04 DIAGNOSIS — M8580 Other specified disorders of bone density and structure, unspecified site: Secondary | ICD-10-CM

## 2019-05-07 ENCOUNTER — Other Ambulatory Visit: Payer: Self-pay | Admitting: *Deleted

## 2019-05-07 DIAGNOSIS — Z170 Estrogen receptor positive status [ER+]: Secondary | ICD-10-CM

## 2019-05-07 DIAGNOSIS — C50411 Malignant neoplasm of upper-outer quadrant of right female breast: Secondary | ICD-10-CM

## 2019-05-08 ENCOUNTER — Ambulatory Visit: Payer: Medicare Other | Attending: Internal Medicine

## 2019-05-08 DIAGNOSIS — Z23 Encounter for immunization: Secondary | ICD-10-CM

## 2019-05-08 NOTE — Progress Notes (Signed)
   Covid-19 Vaccination Clinic  Name:  Sabrina [name]    MRN: [id number] DOB: 1927-01-26  05/08/2019  [name] was observed post Covid-19 immunization for 15 minutes without incident. She was provided with Vaccine Information Sheet and instruction to access the V-Safe system.   [name] was instructed to call 911 with any severe reactions post vaccine: [name] Difficulty breathing  . Swelling of face and throat  . A fast heartbeat  . A bad rash all over body  . Dizziness and weakness   Immunizations Administered    Name Date Dose VIS Date Route   Pfizer COVID-19 Vaccine 05/08/2019 12:52 PM 0.3 mL 02/12/2019 Intramuscular   Manufacturer: [place]   Lot: [id number]   [place]: [address]-7

## 2019-05-09 NOTE — Progress Notes (Signed)
[facility]  Telephone:([phone]) [phone] Fax:(336) [phone]   ID: Sabrina [name] DOB: 02-11-1927  MR#: [id number]  TMH#:[id number]  Patient Care Team: [name], MD as PCP - General (Internal Medicine) Marcy Panning, MD as Consulting Physician (Oncology) Verle Wheeling, Virgie Dad, MD as Consulting Physician (Oncology) Rolm Bookbinder, MD as Consulting Physician (General Surgery) Thea Silversmith, MD as Referring Physician (Radiation Oncology) OTHER MD:    CHIEF COMPLAINT: Estrogen receptor positive breast cancer  CURRENT TREATMENT:  observation   INTERVAL HISTORY: Sabrina [name] returns today for follow-up of her estrogen receptor positive breast cancer and osteoporosis.  She is accompanied by Vito Backers, one of her caregivers.  She was supposed to continue on denosumab/Prolia with her most recent dose 02/06/2018.  However because of the pandemic with it was better to hold treatment until she had her shots.  She had her second vaccine shot on 05/08/2019.  [name]'s last bone density screening on 08/21/2015, showed a T-score of -2.2, which is considered osteopenic. She is overdue for a repeat screening.     Since her last visit, she underwent bilateral screening mammography with tomography at [facility] on 04/12/2019 showing: breast density category B; no evidence of malignancy in either breast.   She also experienced a fall on 09/26/2018. She was found to have a fracture of her distal left clavicle. Fortunately, head CT showed no acute abnormalities.  She also presented to the ED on 10/24/2018 with generalized chills and weakness with dysuria. She was admitted and treated for acute encephalopathy secondary to enterococcus faeciumUTI. Abdomen/pelvis CT performed at that time showed no acute findings.  She was referred to the ED for elevated calcium on 11/05/2018 by her PCP. A head CT was also performed due to concern for altered mental status, and this showed no acute  abnormalities.  In addition to the Covid-19 tests from her ED visits, she was also tested on 01/05/2019. All four of these were negative.   REVIEW OF SYSTEMS: Gillian's husband died January 17, 2019 at [facility], with significant dementia.  She was very tearful today recalling the whole episode and it was very difficult that she was not able to visit him except once.  She now has caregivers 8 AM through 8 PM 7 days a week.  She is pretty much housebound and does not go out except to doctor visits.   BREAST CANCER HISTORY: From Dr. Bernell List Khan's intake note 03/24/2013:  "Sabrina [name] is a 86 y.o. female. Who underwent a screening mammogram. She was found to have a right breast mass measuring 1 cm. There were no abnormalities on her physical exam. Patient had ultrasound performed that showed 1.3 x 1.2 x 1.0 cm hypoechoic mass in the upper outer quadrant of the right breast. In the right axilla there was an abnormal appearing lymph node measuring 1.65 1.4 x 0.8 cm. Both her biopsy. The lymph node was positive for metastatic carcinoma. The primary breast tumor biopsy showed invasive ductal carcinoma, grade 2, ER positive PR positive HER-2/neu equivocal with a proliferation marker Ki-67 15%.."  On 03/30/2013 the patient underwent right lumpectomy in right axillary lymph node sampling. The pathology ([id number]) showed a 1.4 cm invasive ductal carcinoma grade 3, present at the lateral and posterior margins. 3 axillary lymph nodes (non-sentinel lymph nodes) were sampled, one of which had a micrometastatic tumor deposits with extracapsular extension. Repeat HER-2 was positive, with a signals ratio of 2.54 and the number per cell of 3.55.  The patient was started on weekly  Taxol and trastuzumab, but tolerated treatment poorly and develop bilateral DVTs and pulmonary embolism April 2015. At that time her chemotherapy was interrupted.  Her subsequent history is as detailed below.    PAST MEDICAL  HISTORY: Past Medical History:  Diagnosis Date  . Arthritis    "mostly in my hands, lower back" (06/25/2017)  . Basal cell carcinoma (BCC) of face 1983  . Breast cancer, right breast ([place]) 03/2013  . Chronic kidney disease (CKD), stage III (moderate)    nephrologist, Dr. [name]  . Dental crowns present   . DVT (deep venous thrombosis) ([place]) ~ 06/2013   "? side"  . Gout    "on daily RX" (06/25/2017)  . Heart murmur    no known problems; states did not know she had murmur until age 61  . High cholesterol   . Hypertension    fluctuates, especially when stressed; has been on med. > 20 yr.  . Immature cataract   . Non-insulin dependent type 2 diabetes mellitus ([place])   . Personal history of chemotherapy   . Personal history of radiation therapy   . Pulmonary embolism ([place] [place]) ~ 06/2013  . Radiation 09/06/13-10/20/13   Right Breast Cancer  . Stroke ([place]) 05/2017   just visual problems since (06/25/2017)  . Wears partial dentures    lower    PAST SURGICAL HISTORY: Past Surgical History:  Procedure Laterality Date  . AXILLARY LYMPH NODE DISSECTION Right 03/30/2013   Procedure: AXILLARY LYMPH NODE DISSECTION;  Surgeon: Rolm Bookbinder, MD;  Location: [facility];  Service: General;  Laterality: Right;  . BASAL CELL CARCINOMA EXCISION  1983   "face"  . BREAST BIOPSY Right 03/17/2013  . BREAST CYST EXCISION Right 11/1958   benign  . BREAST LUMPECTOMY Right 03/30/2013  . BREAST LUMPECTOMY WITH NEEDLE LOCALIZATION Right 03/30/2013   Procedure: BREAST LUMPECTOMY WITH NEEDLE LOCALIZATION;  Surgeon: Rolm Bookbinder, MD;  Location: [facility];  Service: General;  Laterality: Right;  . DILATION AND CURETTAGE OF UTERUS    . LOOP RECORDER INSERTION N/A 08/15/2017   Procedure: LOOP RECORDER INSERTION;  Surgeon: Evans Lance, MD;  Location: [facility];  Service: Cardiovascular;  Laterality: N/A;  . PORT-A-CATH REMOVAL  2016  . PORTACATH PLACEMENT N/A 04/15/2013   Procedure: INSERTION  PORT-A-CATH;  Surgeon: Rolm Bookbinder, MD;  Location: [facility];  Service: General;  Laterality: N/A;  . RE-EXCISION OF BREAST CANCER,SUPERIOR MARGINS Right 04/15/2013   Procedure: RE-EXCISION OF RIGHT BREAST  MARGINS;  Surgeon: Rolm Bookbinder, MD;  Location: [facility];  Service: General;  Laterality: Right;  . TONSILLECTOMY  ~ 1935/1936    FAMILY HISTORY Family History  Problem Relation Age of Onset  . Pneumonia Mother   . Heart attack Father   . Breast cancer Other 64       niece  . Breast cancer Other 69       niece  . Ovarian cancer Other        niece   the patient's father died in his 27s, in the setting of multiple medical problems. The patient's mother died in her 13s from pneumonia. The patient had 4 sisters, one of whom has died from complications of diabetes. She had a brother who died at 29 months. There is no history of breast cancer in the immediate family although the patient does have 2 nieces with breast cancer. The patient has been tested for the BRCA mutations and her genetics was normal  GYNECOLOGIC HISTORY:  No LMP recorded. Patient is postmenopausal. Menarche age 64, the patient is GX P0. She went through menopause in her 49s, took hormone replacement until she was in her 60s.    SOCIAL HISTORY:  Amanat worked as the [employer] and locally. She and her husband [name] greatly enjoyed the [place] died with dementia 2019/01/20.  He worked for the daily [employer] in [place] and played violin.  , formerly playing for the [employer], later for the [facility]: In place   HEALTH MAINTENANCE: Social History   Tobacco Use  . Smoking status: Never Smoker  . Smokeless tobacco: Never Used  . Tobacco comment: only smoked 2 packs cigarettes total; husband quit in 1971  Substance Use Topics  . Alcohol use: Yes    Alcohol/week: 3.0 standard drinks    Types: 3 Glasses of  wine per week  . Drug use: No     Colonoscopy:   PAP:   Bone density: 08/21/2015 showed T score of -2.2 osteopenia   Allergies  Allergen Reactions  . Beta Adrenergic Blockers     Current Outpatient Medications  Medication Sig Dispense Refill  . allopurinol (ZYLOPRIM) 100 MG tablet Take 100 mg by mouth daily.     [name] apixaban (ELIQUIS) 2.5 MG TABS tablet Take 1 tablet (2.5 mg total) by mouth 2 (two) times daily. appt required for refills [phone] 60 tablet 0  . cholecalciferol (VITAMIN D) 1000 UNITS tablet Take 1,000 Units by mouth daily.    . cloNIDine (CATAPRES) 0.2 MG tablet Take 0.2 mg by mouth 2 (two) times daily.    [name] denosumab (PROLIA) 60 MG/ML SOSY injection Inject 60 mg into the skin every 6 (six) months.    . furosemide (LASIX) 40 MG tablet Take 40 mg by mouth daily.    [name] gabapentin (NEURONTIN) 100 MG capsule Take 3 capsules (300 mg total) by mouth at bedtime. Take 200 mg by mouth twice a day and take 300 mg by mouth at bedtime    . glipiZIDE (GLUCOTROL) 5 MG tablet Take 5 mg by mouth 2 (two) times daily before a meal.     . hydrALAZINE (APRESOLINE) 10 MG tablet Take 1 tablet (10 mg total) by mouth 3 (three) times daily. 90 tablet 2  . hydrocortisone cream 1 % Apply 1 application topically daily as needed for itching.    . polyethylene glycol (MIRALAX / GLYCOLAX) packet Take 17 g by mouth daily.     . traMADol (ULTRAM) 50 MG tablet Take 50 mg by mouth 2 (two) times daily as needed for moderate pain.      No current facility-administered medications for this visit.    OBJECTIVE: Elderly white woman examined in a wheelchair Vitals:   05/10/19 0946  BP: (!) 154/52  Pulse: 77  Resp: 18  Temp: 98.3 F (36.8 C)  SpO2: 97%     Body mass index is 32.48 kg/m.    ECOG FS:2 - Symptomatic, <50% confined to bed   Sclerae unicteric, EOMs intact Wearing a mask No cervical or supraclavicular adenopathy Lungs no rales or rhonchi Heart regular rate and rhythm Abd soft,  protuberant, nontender, positive bowel sounds MSK significant kyphosis but no focal spinal tenderness, no upper extremity lymphedema Neuro: nonfocal, well oriented, appropriate affect Breasts: The right breast is status post lumpectomy.  There is no evidence of local recurrence.  The left breast shows no masses or discharge.  Both axillae are  benign.   LAB RESULTS:  CMP     Component Value Date/Time   NA 144 11/06/2018 0337   NA 140 01/30/2017 1053   K 3.9 11/06/2018 0337   K 4.4 01/30/2017 1053   CL 110 11/06/2018 0337   CO2 30 11/06/2018 0337   CO2 26 01/30/2017 1053   GLUCOSE 174 (H) 11/06/2018 0337   GLUCOSE 277 (H) 01/30/2017 1053   BUN 43 (H) 11/06/2018 0337   BUN 74.3 (H) 01/30/2017 1053   CREATININE 1.91 (H) 11/06/2018 0337   CREATININE 1.79 (H) 02/06/2018 1118   CREATININE 2.2 (H) 01/30/2017 1053   CALCIUM 10.4 (H) 11/06/2018 0337   CALCIUM 10.2 01/30/2017 1053   PROT 6.6 11/04/2018 2021   PROT 7.0 01/30/2017 1053   ALBUMIN 3.5 11/04/2018 2021   ALBUMIN 3.6 01/30/2017 1053   AST 14 (L) 11/04/2018 2021   AST 11 (L) 02/06/2018 1118   AST 13 01/30/2017 1053   ALT 15 11/04/2018 2021   ALT 9 02/06/2018 1118   ALT 12 01/30/2017 1053   ALKPHOS 88 11/04/2018 2021   ALKPHOS 94 01/30/2017 1053   BILITOT 0.3 11/04/2018 2021   BILITOT 0.9 02/06/2018 1118   BILITOT 0.50 01/30/2017 1053   GFRNONAA 23 (L) 11/06/2018 0337   GFRNONAA 25 (L) 02/06/2018 1118   GFRAA 26 (L) 11/06/2018 0337   GFRAA 29 (L) 02/06/2018 1118    I No results found for: SPEP  Lab Results  Component Value Date   WBC 6.3 05/10/2019   NEUTROABS 3.4 05/10/2019   HGB 9.6 (L) 05/10/2019   HCT 30.1 (L) 05/10/2019   MCV 97.4 05/10/2019   PLT 181 05/10/2019      Chemistry      Component Value Date/Time   NA 144 11/06/2018 0337   NA 140 01/30/2017 1053   K 3.9 11/06/2018 0337   K 4.4 01/30/2017 1053   CL 110 11/06/2018 0337   CO2 30 11/06/2018 0337   CO2 26 01/30/2017 1053   BUN 43 (H)  11/06/2018 0337   BUN 74.3 (H) 01/30/2017 1053   CREATININE 1.91 (H) 11/06/2018 0337   CREATININE 1.79 (H) 02/06/2018 1118   CREATININE 2.2 (H) 01/30/2017 1053      Component Value Date/Time   CALCIUM 10.4 (H) 11/06/2018 0337   CALCIUM 10.2 01/30/2017 1053   ALKPHOS 88 11/04/2018 2021   ALKPHOS 94 01/30/2017 1053   AST 14 (L) 11/04/2018 2021   AST 11 (L) 02/06/2018 1118   AST 13 01/30/2017 1053   ALT 15 11/04/2018 2021   ALT 9 02/06/2018 1118   ALT 12 01/30/2017 1053   BILITOT 0.3 11/04/2018 2021   BILITOT 0.9 02/06/2018 1118   BILITOT 0.50 01/30/2017 1053      No results found for: LABCA2  No components found for: LABCA125  No results for input(s): INR in the last 168 hours.  Urinalysis    Component Value Date/Time   COLORURINE STRAW (A) 11/04/2018 2234   APPEARANCEUR CLEAR 11/04/2018 2234   LABSPEC 1.006 11/04/2018 2234   PHURINE 7.0 11/04/2018 2234   GLUCOSEU 50 (A) 11/04/2018 2234   HGBUR NEGATIVE 11/04/2018 2234   BILIRUBINUR NEGATIVE 11/04/2018 2234   KETONESUR NEGATIVE 11/04/2018 2234   PROTEINUR 30 (A) 11/04/2018 2234   UROBILINOGEN 0.2 09/13/2013 0523   NITRITE NEGATIVE 11/04/2018 2234   LEUKOCYTESUR NEGATIVE 11/04/2018 2234    STUDIES: MM 3D SCREEN BREAST BILATERAL  Result Date: 04/13/2019 CLINICAL DATA:  Screening. Prior malignant lumpectomy of the RIGHT  breast in 2015. EXAM: DIGITAL SCREENING BILATERAL MAMMOGRAM WITH TOMO AND CAD COMPARISON:  Previous exam(s). ACR Breast Density Category b: There are scattered areas of fibroglandular density. FINDINGS: Images are less than optimal due to the patient's debilitation and inability to keep her head out of the field of view on the MLO images. Indwelling cardiac recorder device is present in the UPPER INNER LEFT breast. There are no findings suspicious for malignancy. Images were processed with CAD. IMPRESSION: No mammographic evidence of malignancy. A result letter of this screening mammogram will be mailed  directly to the patient. RECOMMENDATION: Screening mammogram in one year is the standard recommendation. However, if the patient's life expectancy is less than 10 years, screening mammography may be discontinued. (Code:SM-B-01Y) BI-RADS CATEGORY  1: Negative. Electronically Signed   By: Evangeline Dakin M.D.   On: 04/13/2019 16:28   CUP PACEART REMOTE DEVICE CHECK  Result Date: 04/29/2019 [facility] summary report received. Battery status OK. Normal device function. No new symptom episodes, tachy episodes, brady, or pause episodes. No new AF episodes. Monthly summary reports and ROV/PRN Kathy Breach, RN, CCDS, [employer]    ASSESSMENT: 86 y.o. [place] woman status post right lumpectomy and axillary lymph node sampling 03/30/2013 for a pT1c pN1a, stage IIA invasive ductal carcinoma, grade 3, estrogen receptor 100% positive, progesterone receptor 100% positive, with an MIB-1 of 15%, and HER-2 amplified, with a signals ratio of 2.54 and the number per cell being 3.55.  (a) margins were positive but cleared with additional surgery February 2015 ([id number])  (1) adjuvant chemotherapy /immunotherapy with paclitaxel and trastuzumab weekly started 05/10/2013, discontinued after 2 doses because of poor tolerance  (2) ventilation/perfusion scan 06/10/2013 documented right lower lobe pulmonary emboli; Doppler ultrasound 06/11/2013 documented bilateral lower extremity DVTs; started on warfarin managed through Dr. Carlyle Lipa office  (3) anastrozole started June 2015, completing 5 years June 2020  (4) adjuvant radiation completed 10/20/2013  (5) Trastuzumab resumed 09/07/2013, continued through 06/07/2014;  final echocardiogram on 03/30/2016showed a well preserved ejection fraction  (6) genetics testing April 2015 was normal and did not reveal a mutation in any of these genes: APC, ATM, AXIN2, BARD1, BMPRIA, BRCA1, BRCA2, BRIP1, CDH1, CDK4, CDKN2A, CHEK2, EPCAM, FANCC, MLH1, MSH2, MSH6, MUTYH,  NBN, PALB2, PMS2, PTEN, RAD51C, SMAD4, STK11, TP53, VHL, and XRCC2  (7) osteopenia: DEXA scan 08/17/2013 showed a T score of -2.1--  (a) started Prolia (denosumab) 01/11/2014, repeated every 6 months, held after December 2019 doseheld after DEC 2019  (b) bone density 08/21/2015 showed a T score of -2.2 osteopenia   PLAN: Yehudit is now 6 years out from definitive surgery for her breast cancer with no evidence of disease recurrence.  This is very favorable.  We follow her because of her significant osteoporosis.  She will receive Prolia today and again in 6 months and 12 months from now.  I will see her with the 63-monthdose.  I expressed our sympathy for the loss of her husband.  She knows to call for any other issue that may develop before the next visit.  Total encounter time 20 minutes.*  Makylah Bossard, GVirgie Dad MD  05/10/19 10:09 AM Medical Oncology and Hematology [facility] [place] [id number]. [phone]   Fax. [phone]  I, KWilburn Mylar am acting as scribe for Dr. GVirgie Dad Javeah Loeza.  I, GLurline DelMD, have reviewed the above documentation for accuracy and completeness, and I agree with the above.    *  Total Encounter Time as defined by the Centers for Medicare and Medicaid Services includes, in addition to the face-to-face time of a patient visit (documented in the note above) non-face-to-face time: obtaining and reviewing outside history, ordering and reviewing medications, tests or procedures, care coordination (communications with other health care professionals or caregivers) and documentation in the medical record.

## 2019-05-10 ENCOUNTER — Other Ambulatory Visit: Payer: Self-pay

## 2019-05-10 ENCOUNTER — Inpatient Hospital Stay: Payer: Medicare Other | Attending: Oncology

## 2019-05-10 ENCOUNTER — Telehealth: Payer: Self-pay | Admitting: Oncology

## 2019-05-10 ENCOUNTER — Inpatient Hospital Stay: Payer: Medicare Other | Admitting: Oncology

## 2019-05-10 ENCOUNTER — Inpatient Hospital Stay: Payer: Medicare Other

## 2019-05-10 VITALS — BP 154/52 | HR 77 | Temp 98.3°F | Resp 18 | Wt 160.8 lb

## 2019-05-10 DIAGNOSIS — C50411 Malignant neoplasm of upper-outer quadrant of right female breast: Secondary | ICD-10-CM | POA: Diagnosis not present

## 2019-05-10 DIAGNOSIS — E119 Type 2 diabetes mellitus without complications: Secondary | ICD-10-CM | POA: Insufficient documentation

## 2019-05-10 DIAGNOSIS — Z923 Personal history of irradiation: Secondary | ICD-10-CM | POA: Insufficient documentation

## 2019-05-10 DIAGNOSIS — M8580 Other specified disorders of bone density and structure, unspecified site: Secondary | ICD-10-CM | POA: Insufficient documentation

## 2019-05-10 DIAGNOSIS — Z85828 Personal history of other malignant neoplasm of skin: Secondary | ICD-10-CM | POA: Insufficient documentation

## 2019-05-10 DIAGNOSIS — Z853 Personal history of malignant neoplasm of breast: Secondary | ICD-10-CM | POA: Insufficient documentation

## 2019-05-10 DIAGNOSIS — Z833 Family history of diabetes mellitus: Secondary | ICD-10-CM | POA: Insufficient documentation

## 2019-05-10 DIAGNOSIS — Z9221 Personal history of antineoplastic chemotherapy: Secondary | ICD-10-CM | POA: Insufficient documentation

## 2019-05-10 DIAGNOSIS — Z170 Estrogen receptor positive status [ER+]: Secondary | ICD-10-CM | POA: Diagnosis not present

## 2019-05-10 DIAGNOSIS — N183 Chronic kidney disease, stage 3 unspecified: Secondary | ICD-10-CM | POA: Insufficient documentation

## 2019-05-10 DIAGNOSIS — I10 Essential (primary) hypertension: Secondary | ICD-10-CM | POA: Insufficient documentation

## 2019-05-10 DIAGNOSIS — C50919 Malignant neoplasm of unspecified site of unspecified female breast: Secondary | ICD-10-CM | POA: Diagnosis not present

## 2019-05-10 DIAGNOSIS — M8000XA Age-related osteoporosis with current pathological fracture, unspecified site, initial encounter for fracture: Secondary | ICD-10-CM

## 2019-05-10 DIAGNOSIS — Z79899 Other long term (current) drug therapy: Secondary | ICD-10-CM | POA: Insufficient documentation

## 2019-05-10 DIAGNOSIS — Z8041 Family history of malignant neoplasm of ovary: Secondary | ICD-10-CM | POA: Insufficient documentation

## 2019-05-10 DIAGNOSIS — Z7984 Long term (current) use of oral hypoglycemic drugs: Secondary | ICD-10-CM | POA: Insufficient documentation

## 2019-05-10 DIAGNOSIS — Z8673 Personal history of transient ischemic attack (TIA), and cerebral infarction without residual deficits: Secondary | ICD-10-CM | POA: Insufficient documentation

## 2019-05-10 DIAGNOSIS — Z803 Family history of malignant neoplasm of breast: Secondary | ICD-10-CM | POA: Insufficient documentation

## 2019-05-10 DIAGNOSIS — Z86718 Personal history of other venous thrombosis and embolism: Secondary | ICD-10-CM | POA: Diagnosis not present

## 2019-05-10 DIAGNOSIS — Z8249 Family history of ischemic heart disease and other diseases of the circulatory system: Secondary | ICD-10-CM | POA: Insufficient documentation

## 2019-05-10 DIAGNOSIS — Z7901 Long term (current) use of anticoagulants: Secondary | ICD-10-CM | POA: Diagnosis not present

## 2019-05-10 DIAGNOSIS — Z86711 Personal history of pulmonary embolism: Secondary | ICD-10-CM | POA: Insufficient documentation

## 2019-05-10 LAB — CMP (CANCER CENTER ONLY)
ALT: 9 U/L (ref 0–44)
AST: 11 U/L — ABNORMAL LOW (ref 15–41)
Albumin: 3.5 g/dL (ref 3.5–5.0)
Alkaline Phosphatase: 100 U/L (ref 38–126)
Anion gap: 11 (ref 5–15)
BUN: 84 mg/dL — ABNORMAL HIGH (ref 8–23)
CO2: 28 mmol/L (ref 22–32)
Calcium: 10.5 mg/dL — ABNORMAL HIGH (ref 8.9–10.3)
Chloride: 102 mmol/L (ref 98–111)
Creatinine: 2.58 mg/dL — ABNORMAL HIGH (ref 0.44–1.00)
GFR, Est AFR Am: 18 mL/min — ABNORMAL LOW (ref >60)
GFR, Estimated: 16 mL/min — ABNORMAL LOW (ref >60)
Glucose, Bld: 195 mg/dL — ABNORMAL HIGH (ref 70–99)
Potassium: 4.1 mmol/L (ref 3.5–5.1)
Sodium: 141 mmol/L (ref 135–145)
Total Bilirubin: 0.6 mg/dL (ref 0.3–1.2)
Total Protein: 7.1 g/dL (ref 6.5–8.1)

## 2019-05-10 LAB — CBC WITH DIFFERENTIAL (CANCER CENTER ONLY)
Abs Immature Granulocytes: 0.01 10*3/uL (ref 0.00–0.07)
Basophils Absolute: 0 10*3/uL (ref 0.0–0.1)
Basophils Relative: 1 %
Eosinophils Absolute: 0.2 10*3/uL (ref 0.0–0.5)
Eosinophils Relative: 4 %
HCT: 30.1 % — ABNORMAL LOW (ref 36.0–46.0)
Hemoglobin: 9.6 g/dL — ABNORMAL LOW (ref 12.0–15.0)
Immature Granulocytes: 0 %
Lymphocytes Relative: 33 %
Lymphs Abs: 2.1 10*3/uL (ref 0.7–4.0)
MCH: 31.1 pg (ref 26.0–34.0)
MCHC: 31.9 g/dL (ref 30.0–36.0)
MCV: 97.4 fL (ref 80.0–100.0)
Monocytes Absolute: 0.5 10*3/uL (ref 0.1–1.0)
Monocytes Relative: 9 %
Neutro Abs: 3.4 10*3/uL (ref 1.7–7.7)
Neutrophils Relative %: 53 %
Platelet Count: 181 10*3/uL (ref 150–400)
RBC: 3.09 MIL/uL — ABNORMAL LOW (ref 3.87–5.11)
RDW: 14.6 % (ref 11.5–15.5)
WBC Count: 6.3 10*3/uL (ref 4.0–10.5)
nRBC: 0 % (ref 0.0–0.2)

## 2019-05-10 MED ORDER — DENOSUMAB 60 MG/ML ~~LOC~~ SOSY
60.0000 mg | PREFILLED_SYRINGE | Freq: Once | SUBCUTANEOUS | Status: AC
  Administered 2019-05-10: 11:00:00 60 mg via SUBCUTANEOUS

## 2019-05-10 MED ORDER — DENOSUMAB 60 MG/ML ~~LOC~~ SOSY
PREFILLED_SYRINGE | SUBCUTANEOUS | Status: AC
  Filled 2019-05-10: qty 1

## 2019-05-10 NOTE — Patient Instructions (Signed)
Denosumab injection What is this medicine? DENOSUMAB (den oh sue mab) slows bone breakdown. Prolia is used to treat osteoporosis in women after menopause and in men, and in people who are taking corticosteroids for 6 months or more. Xgeva is used to treat a high calcium level due to cancer and to prevent bone fractures and other bone problems caused by multiple myeloma or cancer bone metastases. Xgeva is also used to treat giant cell tumor of the bone. This medicine may be used for other purposes; ask your health care provider or pharmacist if you have questions. COMMON BRAND NAME(S): Prolia, XGEVA What should I tell my health care provider before I take this medicine? They need to know if you have any of these conditions:  dental disease  having surgery or tooth extraction  infection  kidney disease  low levels of calcium or Vitamin D in the blood  malnutrition  on hemodialysis  skin conditions or sensitivity  thyroid or parathyroid disease  an unusual reaction to denosumab, other medicines, foods, dyes, or preservatives  pregnant or trying to get pregnant  breast-feeding How should I use this medicine? This medicine is for injection under the skin. It is given by a health care professional in a hospital or clinic setting. A special MedGuide will be given to you before each treatment. Be sure to read this information carefully each time. For Prolia, talk to your pediatrician regarding the use of this medicine in children. Special care may be needed. For Xgeva, talk to your pediatrician regarding the use of this medicine in children. While this drug may be prescribed for children as young as 13 years for selected conditions, precautions do apply. Overdosage: If you think you have taken too much of this medicine contact a poison control center or emergency room at once. NOTE: This medicine is only for you. Do not share this medicine with others. What if I miss a dose? It is  important not to miss your dose. Call your doctor or health care professional if you are unable to keep an appointment. What may interact with this medicine? Do not take this medicine with any of the following medications:  other medicines containing denosumab This medicine may also interact with the following medications:  medicines that lower your chance of fighting infection  steroid medicines like prednisone or cortisone This list may not describe all possible interactions. Give your health care provider a list of all the medicines, herbs, non-prescription drugs, or dietary supplements you use. Also tell them if you smoke, drink alcohol, or use illegal drugs. Some items may interact with your medicine. What should I watch for while using this medicine? Visit your doctor or health care professional for regular checks on your progress. Your doctor or health care professional may order blood tests and other tests to see how you are doing. Call your doctor or health care professional for advice if you get a fever, chills or sore throat, or other symptoms of a cold or flu. Do not treat yourself. This drug may decrease your body's ability to fight infection. Try to avoid being around people who are sick. You should make sure you get enough calcium and vitamin D while you are taking this medicine, unless your doctor tells you not to. Discuss the foods you eat and the vitamins you take with your health care professional. See your dentist regularly. Brush and floss your teeth as directed. Before you have any dental work done, tell your dentist you are   receiving this medicine. Do not become pregnant while taking this medicine or for 5 months after stopping it. Talk with your doctor or health care professional about your birth control options while taking this medicine. Women should inform their doctor if they wish to become pregnant or think they might be pregnant. There is a potential for serious side  effects to an unborn child. Talk to your health care professional or pharmacist for more information. What side effects may I notice from receiving this medicine? Side effects that you should report to your doctor or health care professional as soon as possible:  allergic reactions like skin rash, itching or hives, swelling of the face, lips, or tongue  bone pain  breathing problems  dizziness  jaw pain, especially after dental work  redness, blistering, peeling of the skin  signs and symptoms of infection like fever or chills; cough; sore throat; pain or trouble passing urine  signs of low calcium like fast heartbeat, muscle cramps or muscle pain; pain, tingling, numbness in the hands or feet; seizures  unusual bleeding or bruising  unusually weak or tired Side effects that usually do not require medical attention (report to your doctor or health care professional if they continue or are bothersome):  constipation  diarrhea  headache  joint pain  loss of appetite  muscle pain  runny nose  tiredness  upset stomach This list may not describe all possible side effects. Call your doctor for medical advice about side effects. You may report side effects to FDA at 1-800-FDA-1088. Where should I keep my medicine? This medicine is only given in a clinic, doctor's office, or other health care setting and will not be stored at home. NOTE: This sheet is a summary. It may not cover all possible information. If you have questions about this medicine, talk to your doctor, pharmacist, or health care provider.  2020 Elsevier/Gold Standard (2017-06-27 16:10:44)

## 2019-05-10 NOTE — Telephone Encounter (Signed)
I talk with patient regarding schedule  

## 2019-05-11 DIAGNOSIS — I129 Hypertensive chronic kidney disease with stage 1 through stage 4 chronic kidney disease, or unspecified chronic kidney disease: Secondary | ICD-10-CM | POA: Diagnosis not present

## 2019-05-11 DIAGNOSIS — N184 Chronic kidney disease, stage 4 (severe): Secondary | ICD-10-CM | POA: Diagnosis not present

## 2019-05-11 DIAGNOSIS — E1121 Type 2 diabetes mellitus with diabetic nephropathy: Secondary | ICD-10-CM | POA: Diagnosis not present

## 2019-05-11 DIAGNOSIS — Z0000 Encounter for general adult medical examination without abnormal findings: Secondary | ICD-10-CM | POA: Diagnosis not present

## 2019-05-11 DIAGNOSIS — D472 Monoclonal gammopathy: Secondary | ICD-10-CM | POA: Diagnosis not present

## 2019-05-11 DIAGNOSIS — Z1389 Encounter for screening for other disorder: Secondary | ICD-10-CM | POA: Diagnosis not present

## 2019-05-15 DIAGNOSIS — R269 Unspecified abnormalities of gait and mobility: Secondary | ICD-10-CM | POA: Diagnosis not present

## 2019-05-18 ENCOUNTER — Other Ambulatory Visit: Payer: Self-pay

## 2019-05-18 ENCOUNTER — Encounter: Payer: Self-pay | Admitting: Sports Medicine

## 2019-05-18 ENCOUNTER — Ambulatory Visit (INDEPENDENT_AMBULATORY_CARE_PROVIDER_SITE_OTHER): Payer: Medicare Other | Admitting: Sports Medicine

## 2019-05-18 VITALS — Temp 97.7°F

## 2019-05-18 DIAGNOSIS — B351 Tinea unguium: Secondary | ICD-10-CM

## 2019-05-18 DIAGNOSIS — M79674 Pain in right toe(s): Secondary | ICD-10-CM

## 2019-05-18 DIAGNOSIS — I739 Peripheral vascular disease, unspecified: Secondary | ICD-10-CM | POA: Diagnosis not present

## 2019-05-18 DIAGNOSIS — E119 Type 2 diabetes mellitus without complications: Secondary | ICD-10-CM | POA: Diagnosis not present

## 2019-05-18 DIAGNOSIS — M79675 Pain in left toe(s): Secondary | ICD-10-CM | POA: Diagnosis not present

## 2019-05-18 NOTE — Progress Notes (Signed)
Subjective: Sabrina [name] is a 86 y.o. female patient with history of diabetes who presents to office today complaining of long,mildly painful nails while in shoes; unable to trim. Patient states that the glucose reading this morning was not recorded. No changes with meds or health since last visit.   Patient is assisted by facility aide.    Patient Active Problem List   Diagnosis Date Noted  . Osteoporosis 05/04/2019  . Hypercalcemia 11/04/2018  . History of stroke 11/04/2018  . AF (paroxysmal atrial fibrillation) ([employer]) 11/04/2018  . Acute kidney injury ([place])   . Stage IV breast cancer in female [facility])   . Acute metabolic encephalopathy 63/14/9702  . Personal history of DVT and pulm embolus 10/26/2018  . Personal history of other venous thrombosis and embolism 10/26/2018  . UTI (urinary tract infection) 10/24/2018  . Clavicle fracture 09/26/2018  . Degeneration of lumbar intervertebral disc 10/01/2017  . Blurry vision, bilateral   . Acute blood loss anemia   . History of breast cancer 05/30/2017  . Physical debility 05/30/2017  . Occipital infarction ([place]) 05/30/2017  . Pressure injury of skin 05/28/2017  . Fall at home 05/27/2017  . CKD (chronic kidney disease), stage IV ([place]) 05/27/2017  . High cholesterol 05/27/2017  . DM (diabetes mellitus) ([place]) 05/27/2017  . Hypertension 05/27/2017  . Gout 05/27/2017  . Elevated troponin 05/27/2017  . Lumbar radiculopathy 05/15/2017  . Pain of right calf 04/30/2017  . Asymmetrical left sensorineural hearing loss 02/14/2016  . Impacted cerumen of left ear 02/14/2016  . Lipoma of lower extremity 09/12/2014  . Abnormal x-ray 03/15/2014  . Chronic diastolic congestive heart failure ([place]) 02/23/2014  . Candidiasis of skin 01/13/2014  . Osteopenia 11/30/2013  . Hypoglycemia 09/13/2013  . Acute respiratory failure with hypoxia ([place]) 06/14/2013  . History of pulmonary embolism 06/10/2013  . Nausea and vomiting 06/10/2013  .  Accelerated hypertension 06/10/2013  . Candidiasis of vagina 05/19/2013  . Aortic stenosis 04/22/2013  . Edema leg 04/22/2013  . Breast cancer of upper-outer quadrant of right female breast ([place]); in remission 03/19/2013   Current Outpatient Medications on File Prior to Visit  Medication Sig Dispense Refill  . albuterol (VENTOLIN HFA) 108 (90 Base) MCG/ACT inhaler SMARTSIG:2 Puff(s) By Mouth Every 6 Hours    . allopurinol (ZYLOPRIM) 100 MG tablet Take 100 mg by mouth daily.     [name] apixaban (ELIQUIS) 2.5 MG TABS tablet Take 1 tablet (2.5 mg total) by mouth 2 (two) times daily. appt required for refills [phone] 60 tablet 0  . cephALEXin (KEFLEX) 500 MG capsule cephalexin 500 mg capsule    . cholecalciferol (VITAMIN D) 1000 UNITS tablet Take 1,000 Units by mouth daily.    . cloNIDine (CATAPRES) 0.2 MG tablet Take 0.2 mg by mouth 2 (two) times daily.    . colchicine (COLCRYS) 0.6 MG tablet Colcrys 0.6 mg tablet    . denosumab (PROLIA) 60 MG/ML SOSY injection Inject 60 mg into the skin every 6 (six) months.    . dexamethasone (DECADRON) 4 MG tablet dexamethasone 4 mg tablet    . doxycycline (VIBRAMYCIN) 100 MG capsule Take 100 mg by mouth every 12 (twelve) hours.    . fluconazole (DIFLUCAN) 150 MG tablet fluconazole 150 mg tablet    . furosemide (LASIX) 40 MG tablet Take 40 mg by mouth daily.    [name] gabapentin (NEURONTIN) 100 MG capsule Take 3 capsules (300 mg total) by mouth at bedtime. Take 200 mg by mouth twice a day and  take 300 mg by mouth at bedtime    . glipiZIDE (GLUCOTROL) 5 MG tablet Take 5 mg by mouth 2 (two) times daily before a meal.     . hydrALAZINE (APRESOLINE) 25 MG tablet Take 25 mg by mouth 3 (three) times daily.    [name] HYDROcodone-acetaminophen (NORCO/VICODIN) 5-325 MG tablet hydrocodone 5 mg-acetaminophen 325 mg tablet    . hydrocortisone cream 1 % Apply 1 application topically daily as needed for itching.    . lidocaine-prilocaine (EMLA) cream lidocaine-prilocaine 2.5 %-2.5  % topical cream    . metFORMIN (GLUCOPHAGE) 850 MG tablet metformin 850 mg tablet    . nystatin ointment (MYCOSTATIN) nystatin 100,000 unit/gram topical ointment    . nystatin-triamcinolone (MYCOLOG II) cream nystatin-triamcinolone 100,000 unit/g-0.1 % topical cream    . ofloxacin (OCUFLOX) 0.3 % ophthalmic solution ofloxacin 0.3 % eye drops    . ondansetron (ZOFRAN) 8 MG tablet ondansetron HCl 8 mg tablet    . ondansetron (ZOFRAN-ODT) 8 MG disintegrating tablet ondansetron 8 mg disintegrating tablet  DISSOLVE 1 TABLET ON TONGUE EVERY 8 HOURS AS NEEDED FOR NAUSEA/VOMITING.    . polyethylene glycol (MIRALAX / GLYCOLAX) packet Take 17 g by mouth daily.     . potassium chloride SA (KLOR-CON) 20 MEQ tablet potassium chloride ER 20 mEq tablet,extended release(part/cryst)    . predniSONE (DELTASONE) 20 MG tablet prednisone 20 mg tablet    . prochlorperazine (COMPAZINE) 10 MG tablet prochlorperazine maleate 10 mg tablet    . SSD 1 % cream     . traMADol (ULTRAM) 50 MG tablet Take 50 mg by mouth 2 (two) times daily as needed for moderate pain.     . valACYclovir (VALTREX) 500 MG tablet valacyclovir 500 mg tablet    . warfarin (COUMADIN) 1 MG tablet warfarin 1 mg tablet    . hydrALAZINE (APRESOLINE) 10 MG tablet Take 1 tablet (10 mg total) by mouth 3 (three) times daily. 90 tablet 2   No current facility-administered medications on file prior to visit.   Allergies  Allergen Reactions  . Beta Adrenergic Blockers     Objective: General: Patient is awake, alert, and oriented x 3 and in no acute distress.  Integument: Skin is warm, dry and supple bilateral. Nails are tender, long, thickened and dystrophic with subungual debris, consistent with onychomycosis, 1-5 bilateral. No signs of infection. No open lesions or preulcerative lesions present bilateral. Remaining integument unremarkable.  Vasculature:  Dorsalis Pedis pulse 1/4 bilateral. Posterior Tibial pulse  0/4 bilateral. Capillary fill time <5  sec 1-5 bilateral. Scant hair growth to the level of the digits.Temperature gradient within normal limits. No varicosities present bilateral. No edema present bilateral.   Neurology: The patient has intact sensation measured with a 5.07/10g Semmes Weinstein Monofilament at all pedal sites bilateral. Vibratory sensation diminished bilateral with tuning fork. No Babinski sign present bilateral.   Musculoskeletal:Asymptomatic bunion and hammertoe pedal deformities noted bilateral. Muscular strength 5/5 in all lower extremity muscular groups bilateral without pain on range of motion. No tenderness with calf compression bilateral.  Assessment and Plan: Problem List Items Addressed This Visit    None    Visit Diagnoses    Pain due to onychomycosis of toenails of both feet    -  Primary   Relevant Medications   cephALEXin (KEFLEX) 500 MG capsule   fluconazole (DIFLUCAN) 150 MG tablet   nystatin ointment (MYCOSTATIN)   nystatin-triamcinolone (MYCOLOG II) cream   valACYclovir (VALTREX) 500 MG tablet   PVD (peripheral vascular disease) (  HCC)       Relevant Medications   hydrALAZINE (APRESOLINE) 25 MG tablet   warfarin (COUMADIN) 1 MG tablet   Diabetes mellitus without complication (HCC)       Relevant Medications   metFORMIN (GLUCOPHAGE) 850 MG tablet     -Examined patient. -Re-Discussed and educated patient on diabetic foot care -Mechanically debrided all nails 1-5 bilateral using sterile nail nipper and filed with dremel without incident -Patient to return  in 3 months for at risk foot care -Patient advised to call the office if any problems or questions arise in the meantime.  Landis Martins, DPM

## 2019-05-21 ENCOUNTER — Other Ambulatory Visit (HOSPITAL_COMMUNITY): Payer: Self-pay | Admitting: Physician Assistant

## 2019-05-24 DIAGNOSIS — Z7984 Long term (current) use of oral hypoglycemic drugs: Secondary | ICD-10-CM | POA: Diagnosis not present

## 2019-05-24 DIAGNOSIS — E119 Type 2 diabetes mellitus without complications: Secondary | ICD-10-CM | POA: Diagnosis not present

## 2019-05-24 DIAGNOSIS — H524 Presbyopia: Secondary | ICD-10-CM | POA: Diagnosis not present

## 2019-05-24 DIAGNOSIS — H5213 Myopia, bilateral: Secondary | ICD-10-CM | POA: Diagnosis not present

## 2019-05-30 LAB — CUP PACEART REMOTE DEVICE CHECK
Date Time Interrogation Session: 20210328023746
Implantable Pulse Generator Implant Date: 20190614

## 2019-05-31 ENCOUNTER — Ambulatory Visit (INDEPENDENT_AMBULATORY_CARE_PROVIDER_SITE_OTHER): Payer: Medicare Other | Admitting: *Deleted

## 2019-05-31 DIAGNOSIS — I639 Cerebral infarction, unspecified: Secondary | ICD-10-CM | POA: Diagnosis not present

## 2019-05-31 NOTE — Progress Notes (Signed)
ILR Remote 

## 2019-06-14 ENCOUNTER — Other Ambulatory Visit (HOSPITAL_COMMUNITY): Payer: Self-pay | Admitting: Physician Assistant

## 2019-06-15 DIAGNOSIS — R269 Unspecified abnormalities of gait and mobility: Secondary | ICD-10-CM | POA: Diagnosis not present

## 2019-06-17 DIAGNOSIS — J343 Hypertrophy of nasal turbinates: Secondary | ICD-10-CM | POA: Diagnosis not present

## 2019-06-17 DIAGNOSIS — H6123 Impacted cerumen, bilateral: Secondary | ICD-10-CM | POA: Diagnosis not present

## 2019-06-17 DIAGNOSIS — Z9089 Acquired absence of other organs: Secondary | ICD-10-CM | POA: Diagnosis not present

## 2019-06-17 DIAGNOSIS — H9042 Sensorineural hearing loss, unilateral, left ear, with unrestricted hearing on the contralateral side: Secondary | ICD-10-CM | POA: Diagnosis not present

## 2019-07-01 LAB — CUP PACEART REMOTE DEVICE CHECK
Date Time Interrogation Session: 20210428232657
Implantable Pulse Generator Implant Date: 20190614

## 2019-07-05 ENCOUNTER — Ambulatory Visit (INDEPENDENT_AMBULATORY_CARE_PROVIDER_SITE_OTHER): Payer: Medicare Other | Admitting: *Deleted

## 2019-07-05 DIAGNOSIS — I480 Paroxysmal atrial fibrillation: Secondary | ICD-10-CM

## 2019-07-05 NOTE — Progress Notes (Signed)
[facility] Summary Report / Loop Recorder 

## 2019-07-12 ENCOUNTER — Other Ambulatory Visit (HOSPITAL_COMMUNITY): Payer: Self-pay | Admitting: Physician Assistant

## 2019-07-27 DIAGNOSIS — M519 Unspecified thoracic, thoracolumbar and lumbosacral intervertebral disc disorder: Secondary | ICD-10-CM | POA: Diagnosis not present

## 2019-08-03 ENCOUNTER — Ambulatory Visit (INDEPENDENT_AMBULATORY_CARE_PROVIDER_SITE_OTHER): Payer: Medicare Other | Admitting: *Deleted

## 2019-08-03 DIAGNOSIS — I639 Cerebral infarction, unspecified: Secondary | ICD-10-CM | POA: Diagnosis not present

## 2019-08-03 LAB — CUP PACEART REMOTE DEVICE CHECK
Date Time Interrogation Session: 20210529233429
Implantable Pulse Generator Implant Date: 20190614

## 2019-08-04 NOTE — Progress Notes (Signed)
[facility] Summary Report / Loop Recorder 

## 2019-08-09 ENCOUNTER — Other Ambulatory Visit (HOSPITAL_COMMUNITY): Payer: Self-pay | Admitting: Physician Assistant

## 2019-08-10 ENCOUNTER — Other Ambulatory Visit (HOSPITAL_COMMUNITY): Payer: Self-pay | Admitting: Physician Assistant

## 2019-08-16 ENCOUNTER — Encounter (HOSPITAL_COMMUNITY): Payer: Self-pay | Admitting: Physician Assistant

## 2019-08-16 ENCOUNTER — Ambulatory Visit (HOSPITAL_COMMUNITY)
Admission: RE | Admit: 2019-08-16 | Discharge: 2019-08-16 | Disposition: A | Discharge status: Home / Self Care | Payer: Medicare Other | Source: Ambulatory Visit | Attending: Physician Assistant | Admitting: Physician Assistant

## 2019-08-16 ENCOUNTER — Other Ambulatory Visit: Payer: Self-pay

## 2019-08-16 VITALS — BP 144/60 | HR 85 | Ht 59.0 in | Wt 173.0 lb

## 2019-08-16 DIAGNOSIS — Z79899 Other long term (current) drug therapy: Secondary | ICD-10-CM | POA: Insufficient documentation

## 2019-08-16 DIAGNOSIS — Z7984 Long term (current) use of oral hypoglycemic drugs: Secondary | ICD-10-CM | POA: Diagnosis not present

## 2019-08-16 DIAGNOSIS — D6869 Other thrombophilia: Secondary | ICD-10-CM | POA: Diagnosis not present

## 2019-08-16 DIAGNOSIS — E1122 Type 2 diabetes mellitus with diabetic chronic kidney disease: Secondary | ICD-10-CM | POA: Diagnosis not present

## 2019-08-16 DIAGNOSIS — I129 Hypertensive chronic kidney disease with stage 1 through stage 4 chronic kidney disease, or unspecified chronic kidney disease: Secondary | ICD-10-CM | POA: Diagnosis not present

## 2019-08-16 DIAGNOSIS — E785 Hyperlipidemia, unspecified: Secondary | ICD-10-CM | POA: Diagnosis not present

## 2019-08-16 DIAGNOSIS — Z853 Personal history of malignant neoplasm of breast: Secondary | ICD-10-CM | POA: Diagnosis not present

## 2019-08-16 DIAGNOSIS — Z86718 Personal history of other venous thrombosis and embolism: Secondary | ICD-10-CM | POA: Diagnosis not present

## 2019-08-16 DIAGNOSIS — Z8673 Personal history of transient ischemic attack (TIA), and cerebral infarction without residual deficits: Secondary | ICD-10-CM | POA: Insufficient documentation

## 2019-08-16 DIAGNOSIS — Z86711 Personal history of pulmonary embolism: Secondary | ICD-10-CM | POA: Insufficient documentation

## 2019-08-16 DIAGNOSIS — I10 Essential (primary) hypertension: Secondary | ICD-10-CM | POA: Diagnosis not present

## 2019-08-16 DIAGNOSIS — Z9221 Personal history of antineoplastic chemotherapy: Secondary | ICD-10-CM | POA: Diagnosis not present

## 2019-08-16 DIAGNOSIS — Z803 Family history of malignant neoplasm of breast: Secondary | ICD-10-CM | POA: Diagnosis not present

## 2019-08-16 DIAGNOSIS — Z888 Allergy status to other drugs, medicaments and biological substances status: Secondary | ICD-10-CM | POA: Insufficient documentation

## 2019-08-16 DIAGNOSIS — Z85828 Personal history of other malignant neoplasm of skin: Secondary | ICD-10-CM | POA: Diagnosis not present

## 2019-08-16 DIAGNOSIS — I480 Paroxysmal atrial fibrillation: Secondary | ICD-10-CM | POA: Diagnosis not present

## 2019-08-16 DIAGNOSIS — Z923 Personal history of irradiation: Secondary | ICD-10-CM | POA: Insufficient documentation

## 2019-08-16 DIAGNOSIS — Z8249 Family history of ischemic heart disease and other diseases of the circulatory system: Secondary | ICD-10-CM | POA: Diagnosis not present

## 2019-08-16 DIAGNOSIS — Z7901 Long term (current) use of anticoagulants: Secondary | ICD-10-CM | POA: Diagnosis not present

## 2019-08-16 DIAGNOSIS — N183 Chronic kidney disease, stage 3 unspecified: Secondary | ICD-10-CM | POA: Insufficient documentation

## 2019-08-16 MED ORDER — APIXABAN 2.5 MG PO TABS: 2.5000 mg | ORAL_TABLET | Freq: Two times a day (BID) | ORAL | 11 refills | Status: DC

## 2019-08-16 NOTE — Addendum Note (Signed)
Encounter addended by: Hinda Kehr, CMA on: 08/16/2019 11:25 AM  Actions taken: Order list changed

## 2019-08-16 NOTE — Progress Notes (Signed)
Primary Care Physician: [name], MD Primary Electrophysiologist: Dr Lovena Le Referring Physician: Dr [facility]   [name] is a 86 y.o. female with a history of hypertension, breast cancer, chemotherapy-induced DVT and PE, type 2 diabetes on oral hypoglycemics, hyperlipidemia, chronic debility, stage III chronic kidney disease and CVA who presents for follow up in the [facility].  The patient was initially diagnosed with atrial fibrillation on her ILR on 09/26/18. She was asymptomatic during this episode. She was actually hospitalized at the time for a mechanical fall and clavicle fracture.   On follow up today, patient reports that she has done well since her last appointment. Her ILR shows 0% afib burden. She denies any bleeding issues on anticoagulation.   Today, she denies symptoms of palpitations, chest pain, shortness of breath, orthopnea, PND, dizziness, presyncope, syncope, snoring, daytime somnolence, bleeding, or neurologic sequela. The patient is tolerating medications without difficulties and is otherwise without complaint today.    Atrial Fibrillation Risk Factors:  she does not have symptoms or diagnosis of sleep apnea.  she has a BMI of Body mass index is 34.94 kg/m.[name] Filed Weights   08/16/19 1041  Weight: 78.5 kg    Family History  Problem Relation Age of Onset  . Pneumonia Mother   . Heart attack Father   . Breast cancer Other 58       niece  . Breast cancer Other 1       niece  . Ovarian cancer Other        niece     Atrial Fibrillation Management history:  Previous antiarrhythmic drugs: none Previous cardioversions: none Previous ablations: none CHADS2VASC score: 7 Anticoagulation history: Eliquis   Past Medical History:  Diagnosis Date  . Arthritis    "mostly in my hands, lower back" (06/25/2017)  . Basal cell carcinoma (BCC) of face 1983  . Breast cancer, right breast ([place]) 03/2013  .  Chronic kidney disease (CKD), stage III (moderate)    nephrologist, Dr. [name]  . Dental crowns present   . DVT (deep venous thrombosis) ([place]) ~ 06/2013   "? side"  . Gout    "on daily RX" (06/25/2017)  . Heart murmur    no known problems; states did not know she had murmur until age 66  . High cholesterol   . Hypertension    fluctuates, especially when stressed; has been on med. > 20 yr.  . Immature cataract   . Non-insulin dependent type 2 diabetes mellitus ([place])   . Personal history of chemotherapy   . Personal history of radiation therapy   . Pulmonary embolism ([place]) ~ 06/2013  . Radiation 09/06/13-10/20/13   Right Breast Cancer  . Stroke ([place]) 05/2017   just visual problems since (06/25/2017)  . Wears partial dentures    lower   Past Surgical History:  Procedure Laterality Date  . AXILLARY LYMPH NODE DISSECTION Right 03/30/2013   Procedure: AXILLARY LYMPH NODE DISSECTION;  Surgeon: Rolm Bookbinder, MD;  Location: [place];  Service: General;  Laterality: Right;  . BASAL CELL CARCINOMA EXCISION  1983   "face"  . BREAST BIOPSY Right 03/17/2013  . BREAST CYST EXCISION Right 11/1958   benign  . BREAST LUMPECTOMY Right 03/30/2013  . BREAST LUMPECTOMY WITH NEEDLE LOCALIZATION Right 03/30/2013   Procedure: BREAST LUMPECTOMY WITH NEEDLE LOCALIZATION;  Surgeon: Rolm Bookbinder, MD;  Location: [place];  Service: General;  Laterality: Right;  . DILATION AND CURETTAGE OF UTERUS    .  LOOP RECORDER INSERTION N/A 08/15/2017   Procedure: LOOP RECORDER INSERTION;  Surgeon: [place] Lance, MD;  Location: [place] [facility];  Service: Cardiovascular;  Laterality: N/A;  . PORT-A-CATH REMOVAL  2016  . PORTACATH PLACEMENT N/A 04/15/2013   Procedure: INSERTION PORT-A-CATH;  Surgeon: Rolm Bookbinder, MD;  Location: [place];  Service: General;  Laterality: N/A;  . RE-EXCISION OF BREAST CANCER,SUPERIOR MARGINS Right 04/15/2013   Procedure: RE-EXCISION OF RIGHT BREAST   MARGINS;  Surgeon: Rolm Bookbinder, MD;  Location: [place];  Service: General;  Laterality: Right;  . TONSILLECTOMY  ~ 1935/1936    Current Outpatient Medications  Medication Sig Dispense Refill  . albuterol (VENTOLIN HFA) 108 (90 Base) MCG/ACT inhaler SMARTSIG:2 Puff(s) By Mouth Every 6 Hours    . allopurinol (ZYLOPRIM) 100 MG tablet Take 100 mg by mouth daily.     . cholecalciferol (VITAMIN D) 1000 UNITS tablet Take 1,000 Units by mouth daily.    . cloNIDine (CATAPRES) 0.2 MG tablet Take 0.2 mg by mouth 2 (two) times daily.    . colchicine (COLCRYS) 0.6 MG tablet Colcrys 0.6 mg tablet    . denosumab (PROLIA) 60 MG/ML SOSY injection Inject 60 mg into the skin every 6 (six) months.    [name] ELIQUIS 2.5 MG TABS tablet TAKE 1 TABLET BY MOUTH TWICE DAILY. 60 tablet 0  . furosemide (LASIX) 40 MG tablet Take 40 mg by mouth daily.    [name] gabapentin (NEURONTIN) 100 MG capsule Take 3 capsules (300 mg total) by mouth at bedtime. Take 200 mg by mouth twice a day and take 300 mg by mouth at bedtime    . glipiZIDE (GLUCOTROL) 5 MG tablet Take 5 mg by mouth 2 (two) times daily before a meal.     . hydrALAZINE (APRESOLINE) 25 MG tablet Take 25 mg by mouth 3 (three) times daily.    . hydrocortisone cream 1 % Apply 1 application topically daily as needed for itching.    . metFORMIN (GLUCOPHAGE) 850 MG tablet metformin 850 mg tablet    . nystatin ointment (MYCOSTATIN) nystatin 100,000 unit/gram topical ointment    . nystatin-triamcinolone (MYCOLOG II) cream nystatin-triamcinolone 100,000 unit/g-0.1 % topical cream    . ofloxacin (OCUFLOX) 0.3 % ophthalmic solution ofloxacin 0.3 % eye drops    . polyethylene glycol (MIRALAX / GLYCOLAX) packet Take 17 g by mouth daily.     [name] SSD 1 % cream     . traMADol (ULTRAM) 50 MG tablet Take 50 mg by mouth 2 (two) times daily as needed for moderate pain.     . TRUE METRIX BLOOD GLUCOSE TEST test strip     . valACYclovir (VALTREX) 500 MG tablet valacyclovir  500 mg tablet    . hydrALAZINE (APRESOLINE) 10 MG tablet Take 1 tablet (10 mg total) by mouth 3 (three) times daily. 90 tablet 2   No current facility-administered medications for this encounter.    Allergies  Allergen Reactions  . Beta Adrenergic Blockers     Social History   Socioeconomic History  . Marital status: Married    Spouse name: Not on file  . Number of children: 0  . Years of education: Not on file  . Highest education level: Bachelor's degree (e.g., BA, AB, BS)  Occupational History  . Not on file  Tobacco Use  . Smoking status: Never Smoker  . Smokeless tobacco: Never Used  . Tobacco comment: only smoked 2 packs cigarettes total; husband quit in 1971  Vaping Use  . Vaping Use: Never used  Substance and Sexual Activity  . Alcohol use: Not Currently    Alcohol/week: 3.0 standard drinks    Types: 3 Glasses of wine per week  . Drug use: No  . Sexual activity: Not Currently  Other Topics Concern  . Not on file  Social History Narrative   Lives at home with her husband   Right handed   Caffeine: 1 coffee daily at most    Social Determinants of Health   Financial Resource Strain:   . Difficulty of Paying Living Expenses:   Food Insecurity:   . Worried About Charity fundraiser in the Last Year:   . Arboriculturist in the Last Year:   Transportation Needs:   . Film/video editor (Medical):   [name] Lack of Transportation (Non-Medical):   Physical Activity:   . Days of Exercise per Week:   . Minutes of Exercise per Session:   Stress:   . Feeling of Stress :   Social Connections:   . Frequency of Communication with Friends and Family:   . Frequency of Social Gatherings with Friends and Family:   . Attends Religious Services:   . Active Member of Clubs or Organizations:   . Attends Archivist Meetings:   [name] Marital Status:   Intimate Partner Violence:   . Fear of Current or Ex-Partner:   . Emotionally Abused:   [name] Physically Abused:   .  Sexually Abused:      ROS- All systems are reviewed and negative except as per the HPI above.  Physical Exam: Vitals:   08/16/19 1041  BP: (!) 144/60  Pulse: 85  Weight: 78.5 kg  Height: 4\' 11"  (1.499 m)   GEN- The patient is well appearing elderly obese female, alert and oriented x 3 today.   HEENT-head normocephalic, atraumatic, sclera clear, conjunctiva pink, hearing intact, trachea midline. Lungs- Clear to ausculation bilaterally, normal work of breathing Heart- Regular rate and rhythm, no murmurs, rubs or gallops  GI- soft, NT, ND, + BS Extremities- no clubbing, cyanosis. Trace bilateral edema MS- no significant deformity or atrophy Skin- no rash or lesion Psych- euthymic mood, full affect Neuro- strength and sensation are intact   Wt Readings from Last 3 Encounters:  08/16/19 78.5 kg  05/10/19 72.9 kg  11/11/18 76.2 kg    EKG today demonstrates SR HR 85, PR 192, QRS 84, QTc 433  Echo 05/27/17 demonstrated  - Procedure narrative: Transthoracic echocardiography. Image   quality was poor. The study was technically difficult, as a   result of poor sound wave transmission. - Left ventricle: The cavity size was normal. Wall thickness was   increased in a pattern of mild LVH. There was mild concentric   hypertrophy. Systolic function was normal. The estimated ejection   fraction was in the range of 55% to 60%. Wall motion was normal;   there were no regional wall motion abnormalities. Doppler   parameters are consistent with abnormal left ventricular   relaxation (grade 1 diastolic dysfunction). - Aortic valve: There was mild stenosis. Valve area (VTI): 1.75   cm^2. Valve area (Vmax): 1.8 cm^2. Valve area (Vmean): 1.7 cm^2. - Mitral valve: Severely calcified annulus. Valve area by   continuity equation (using LVOT flow): 1.95 cm^2. - Atrial septum: No defect or patent foramen ovale was identified.  Epic records are reviewed at length today  Assessment and  Plan:  1. Paroxysmal atrial fibrillation Patient maintaining  SR with 0% afib burden on ILR Continue Eliquis 2.5 mg BID (age, Cr 2.58)  This patients CHA2DS2-VASc Score and unadjusted Ischemic Stroke Rate (% per year) is equal to 11.2 % stroke rate/year from a score of 7  Above score calculated as 1 point each if present [CHF, HTN, DM, Vascular=MI/PAD/Aortic Plaque, Age if 65-74, or Female] Above score calculated as 2 points each if present [Age > 75, or Stroke/TIA/TE]  2. HTN Stable, no changes today.   Follow up in the AF clinic in 6 months.    [facility] [address] [place], [place] [address] [phone] 08/16/2019 11:10 AM

## 2019-08-24 ENCOUNTER — Ambulatory Visit (INDEPENDENT_AMBULATORY_CARE_PROVIDER_SITE_OTHER): Payer: Medicare Other | Admitting: Sports Medicine

## 2019-08-24 ENCOUNTER — Encounter: Payer: Self-pay | Admitting: Sports Medicine

## 2019-08-24 ENCOUNTER — Other Ambulatory Visit: Payer: Self-pay

## 2019-08-24 DIAGNOSIS — E119 Type 2 diabetes mellitus without complications: Secondary | ICD-10-CM | POA: Diagnosis not present

## 2019-08-24 DIAGNOSIS — I739 Peripheral vascular disease, unspecified: Secondary | ICD-10-CM | POA: Diagnosis not present

## 2019-08-24 DIAGNOSIS — B351 Tinea unguium: Secondary | ICD-10-CM

## 2019-08-24 DIAGNOSIS — M79675 Pain in left toe(s): Secondary | ICD-10-CM

## 2019-08-24 DIAGNOSIS — M79674 Pain in right toe(s): Secondary | ICD-10-CM

## 2019-08-24 NOTE — Progress Notes (Signed)
Subjective: Sabrina [name] is a 86 y.o. female patient with history of diabetes who presents to office today complaining of long,mildly painful nails while in shoes; unable to trim. Patient states that the glucose reading this morning was 113 and last A1c was around 7.  Patient also admits that she has increased swelling to both legs and is not sure why this is happening.  Patient denies shortness of breath chest pain or any increase in calf or leg pain that is associated with the swelling.  No other pedal complaints noted.   Patient is assisted by facility aide again this visit.   Patient Active Problem List   Diagnosis Date Noted  . Secondary hypercoagulable state ([place]) 08/16/2019  . Osteoporosis 05/04/2019  . Hypercalcemia 11/04/2018  . History of stroke 11/04/2018  . Paroxysmal atrial fibrillation ([place]) 11/04/2018  . Acute kidney injury ([place])   . Stage IV breast cancer in female [facility])   . Acute metabolic encephalopathy 20/35/5974  . Personal history of DVT and pulm embolus 10/26/2018  . Personal history of other venous thrombosis and embolism 10/26/2018  . UTI (urinary tract infection) 10/24/2018  . Clavicle fracture 09/26/2018  . Degeneration of lumbar intervertebral disc 10/01/2017  . Blurry vision, bilateral   . Acute blood loss anemia   . History of breast cancer 05/30/2017  . Physical debility 05/30/2017  . Occipital infarction ([place]) 05/30/2017  . Pressure injury of skin 05/28/2017  . Fall at home 05/27/2017  . CKD (chronic kidney disease), stage IV ([place]) 05/27/2017  . High cholesterol 05/27/2017  . DM (diabetes mellitus) ([place]) 05/27/2017  . Hypertension 05/27/2017  . Gout 05/27/2017  . Elevated troponin 05/27/2017  . Lumbar radiculopathy 05/15/2017  . Pain of right calf 04/30/2017  . Asymmetrical left sensorineural hearing loss 02/14/2016  . Impacted cerumen of left ear 02/14/2016  . Lipoma of lower extremity 09/12/2014  . Abnormal x-ray 03/15/2014  . Chronic  diastolic congestive heart failure ([place]) 02/23/2014  . Candidiasis of skin 01/13/2014  . Osteopenia 11/30/2013  . Hypoglycemia 09/13/2013  . Acute respiratory failure with hypoxia ([place]) 06/14/2013  . History of pulmonary embolism 06/10/2013  . Nausea and vomiting 06/10/2013  . Accelerated hypertension 06/10/2013  . Candidiasis of vagina 05/19/2013  . Aortic stenosis 04/22/2013  . Edema leg 04/22/2013  . Breast cancer of upper-outer quadrant of right female breast ([place]); in remission 03/19/2013   Current Outpatient Medications on File Prior to Visit  Medication Sig Dispense Refill  . albuterol (VENTOLIN HFA) 108 (90 Base) MCG/ACT inhaler SMARTSIG:2 Puff(s) By Mouth Every 6 Hours    . allopurinol (ZYLOPRIM) 100 MG tablet Take 100 mg by mouth daily.     [name] apixaban (ELIQUIS) 2.5 MG TABS tablet Take 1 tablet (2.5 mg total) by mouth 2 (two) times daily. 60 tablet 11  . cholecalciferol (VITAMIN D) 1000 UNITS tablet Take 1,000 Units by mouth daily.    . cloNIDine (CATAPRES) 0.2 MG tablet Take 0.2 mg by mouth 2 (two) times daily.    . colchicine (COLCRYS) 0.6 MG tablet Colcrys 0.6 mg tablet    . denosumab (PROLIA) 60 MG/ML SOSY injection Inject 60 mg into the skin every 6 (six) months.    . furosemide (LASIX) 40 MG tablet Take 40 mg by mouth daily.    [name] gabapentin (NEURONTIN) 100 MG capsule Take 3 capsules (300 mg total) by mouth at bedtime. Take 200 mg by mouth twice a day and take 300 mg by mouth at bedtime    .  glipiZIDE (GLUCOTROL) 5 MG tablet Take 5 mg by mouth 2 (two) times daily before a meal.     . hydrALAZINE (APRESOLINE) 25 MG tablet Take 25 mg by mouth 3 (three) times daily.    . hydrocortisone cream 1 % Apply 1 application topically daily as needed for itching.    . metFORMIN (GLUCOPHAGE) 850 MG tablet metformin 850 mg tablet    . nystatin ointment (MYCOSTATIN) nystatin 100,000 unit/gram topical ointment    . nystatin-triamcinolone (MYCOLOG II) cream nystatin-triamcinolone 100,000  unit/g-0.1 % topical cream    . ofloxacin (OCUFLOX) 0.3 % ophthalmic solution ofloxacin 0.3 % eye drops    . polyethylene glycol (MIRALAX / GLYCOLAX) packet Take 17 g by mouth daily.     [name] SSD 1 % cream     . traMADol (ULTRAM) 50 MG tablet Take 50 mg by mouth 2 (two) times daily as needed for moderate pain.     . TRUE METRIX BLOOD GLUCOSE TEST test strip     . TRUEplus Lancets 28G MISC Apply topically.    . valACYclovir (VALTREX) 500 MG tablet valacyclovir 500 mg tablet    . hydrALAZINE (APRESOLINE) 10 MG tablet Take 1 tablet (10 mg total) by mouth 3 (three) times daily. 90 tablet 2   No current facility-administered medications on file prior to visit.   Allergies  Allergen Reactions  . Beta Adrenergic Blockers     Objective: General: Patient is awake, alert, and oriented x 3 and in no acute distress.  Integument: Skin is warm, dry and supple bilateral. Nails are tender, long, thickened and dystrophic with subungual debris, consistent with onychomycosis, 1-5 bilateral. No signs of infection. No open lesions or preulcerative lesions present bilateral. Remaining integument unremarkable.  Vasculature:  Dorsalis Pedis pulse 1/4 bilateral. Posterior Tibial pulse  0/4 bilateral. Capillary fill time <5 sec 1-5 bilateral. Scant hair growth to the level of the digits.Temperature gradient within normal limits. No varicosities present bilateral.  1+ pitting edema present bilateral.   Neurology: The patient has intact sensation measured with a 5.07/10g Semmes Weinstein Monofilament at all pedal sites bilateral like previous.  Musculoskeletal:Asymptomatic bunion and hammertoe pedal deformities noted bilateral. Muscular strength 5/5 in all lower extremity muscular groups bilateral without pain on range of motion. No tenderness with calf compression bilateral.  Negative signs of DVT.  Assessment and Plan: Problem List Items Addressed This Visit    None    Visit Diagnoses    Pain due to  onychomycosis of toenails of both feet    -  Primary   PVD (peripheral vascular disease) ([place])       Diabetes mellitus without complication ([place])         -Examined patient. -Discussed and educated patient on diabetic foot care -Mechanically debrided all nails 1-5 bilateral using sterile nail nipper and filed with dremel without incident -Applied Ace wraps bilateral legs to assist with edema control and advised patient to use these to help with edema control since it is difficult for her to wear compression garments, patient instructed to wear them daily until swelling has resolved -Patient to return  in 3 months for at risk foot care -Patient advised to call the office if any problems or questions arise in the meantime.  Landis Martins, DPM

## 2019-09-03 ENCOUNTER — Ambulatory Visit (INDEPENDENT_AMBULATORY_CARE_PROVIDER_SITE_OTHER): Payer: Medicare Other | Admitting: *Deleted

## 2019-09-03 DIAGNOSIS — I639 Cerebral infarction, unspecified: Secondary | ICD-10-CM | POA: Diagnosis not present

## 2019-09-03 LAB — CUP PACEART REMOTE DEVICE CHECK
Date Time Interrogation Session: 20210701230416
Implantable Pulse Generator Implant Date: 20190614

## 2019-09-07 NOTE — Progress Notes (Signed)
[facility] Summary Report / Loop Recorder 

## 2019-09-17 DIAGNOSIS — I480 Paroxysmal atrial fibrillation: Secondary | ICD-10-CM | POA: Diagnosis not present

## 2019-09-17 DIAGNOSIS — R269 Unspecified abnormalities of gait and mobility: Secondary | ICD-10-CM | POA: Diagnosis not present

## 2019-09-17 DIAGNOSIS — D6869 Other thrombophilia: Secondary | ICD-10-CM | POA: Diagnosis not present

## 2019-09-17 DIAGNOSIS — I129 Hypertensive chronic kidney disease with stage 1 through stage 4 chronic kidney disease, or unspecified chronic kidney disease: Secondary | ICD-10-CM | POA: Diagnosis not present

## 2019-10-09 LAB — CUP PACEART REMOTE DEVICE CHECK
Date Time Interrogation Session: 20210803231543
Implantable Pulse Generator Implant Date: 20190614

## 2019-10-11 ENCOUNTER — Ambulatory Visit (INDEPENDENT_AMBULATORY_CARE_PROVIDER_SITE_OTHER): Payer: Medicare Other | Admitting: *Deleted

## 2019-10-11 DIAGNOSIS — I480 Paroxysmal atrial fibrillation: Secondary | ICD-10-CM | POA: Diagnosis not present

## 2019-10-11 NOTE — Progress Notes (Signed)
[facility] Summary Report / Loop Recorder 

## 2019-11-09 ENCOUNTER — Other Ambulatory Visit: Payer: Self-pay | Admitting: *Deleted

## 2019-11-09 ENCOUNTER — Other Ambulatory Visit: Payer: Self-pay

## 2019-11-09 DIAGNOSIS — C50411 Malignant neoplasm of upper-outer quadrant of right female breast: Secondary | ICD-10-CM

## 2019-11-10 ENCOUNTER — Other Ambulatory Visit: Payer: Self-pay

## 2019-11-10 ENCOUNTER — Inpatient Hospital Stay: Payer: Medicare Other | Attending: Oncology

## 2019-11-10 ENCOUNTER — Inpatient Hospital Stay: Payer: Medicare Other

## 2019-11-10 VITALS — BP 168/76 | HR 62 | Resp 18

## 2019-11-10 DIAGNOSIS — E119 Type 2 diabetes mellitus without complications: Secondary | ICD-10-CM | POA: Diagnosis not present

## 2019-11-10 DIAGNOSIS — Z8249 Family history of ischemic heart disease and other diseases of the circulatory system: Secondary | ICD-10-CM | POA: Diagnosis not present

## 2019-11-10 DIAGNOSIS — Z8673 Personal history of transient ischemic attack (TIA), and cerebral infarction without residual deficits: Secondary | ICD-10-CM | POA: Diagnosis not present

## 2019-11-10 DIAGNOSIS — Z923 Personal history of irradiation: Secondary | ICD-10-CM | POA: Diagnosis not present

## 2019-11-10 DIAGNOSIS — N183 Chronic kidney disease, stage 3 unspecified: Secondary | ICD-10-CM | POA: Diagnosis not present

## 2019-11-10 DIAGNOSIS — Z803 Family history of malignant neoplasm of breast: Secondary | ICD-10-CM | POA: Insufficient documentation

## 2019-11-10 DIAGNOSIS — I10 Essential (primary) hypertension: Secondary | ICD-10-CM | POA: Diagnosis not present

## 2019-11-10 DIAGNOSIS — Z86718 Personal history of other venous thrombosis and embolism: Secondary | ICD-10-CM | POA: Insufficient documentation

## 2019-11-10 DIAGNOSIS — Z7984 Long term (current) use of oral hypoglycemic drugs: Secondary | ICD-10-CM | POA: Insufficient documentation

## 2019-11-10 DIAGNOSIS — Z853 Personal history of malignant neoplasm of breast: Secondary | ICD-10-CM | POA: Insufficient documentation

## 2019-11-10 DIAGNOSIS — Z79899 Other long term (current) drug therapy: Secondary | ICD-10-CM | POA: Insufficient documentation

## 2019-11-10 DIAGNOSIS — M8580 Other specified disorders of bone density and structure, unspecified site: Secondary | ICD-10-CM

## 2019-11-10 DIAGNOSIS — Z833 Family history of diabetes mellitus: Secondary | ICD-10-CM | POA: Diagnosis not present

## 2019-11-10 DIAGNOSIS — Z9221 Personal history of antineoplastic chemotherapy: Secondary | ICD-10-CM | POA: Diagnosis not present

## 2019-11-10 DIAGNOSIS — Z86711 Personal history of pulmonary embolism: Secondary | ICD-10-CM | POA: Diagnosis not present

## 2019-11-10 DIAGNOSIS — Z7901 Long term (current) use of anticoagulants: Secondary | ICD-10-CM | POA: Diagnosis not present

## 2019-11-10 DIAGNOSIS — Z8041 Family history of malignant neoplasm of ovary: Secondary | ICD-10-CM | POA: Diagnosis not present

## 2019-11-10 DIAGNOSIS — Z85828 Personal history of other malignant neoplasm of skin: Secondary | ICD-10-CM | POA: Diagnosis not present

## 2019-11-10 DIAGNOSIS — C50411 Malignant neoplasm of upper-outer quadrant of right female breast: Secondary | ICD-10-CM

## 2019-11-10 LAB — CMP (CANCER CENTER ONLY)
ALT: 8 U/L (ref 0–44)
AST: 11 U/L — ABNORMAL LOW (ref 15–41)
Albumin: 3.7 g/dL (ref 3.5–5.0)
Alkaline Phosphatase: 75 U/L (ref 38–126)
Anion gap: 8 (ref 5–15)
BUN: 93 mg/dL — ABNORMAL HIGH (ref 8–23)
CO2: 30 mmol/L (ref 22–32)
Calcium: 10.1 mg/dL (ref 8.9–10.3)
Chloride: 104 mmol/L (ref 98–111)
Creatinine: 2.64 mg/dL — ABNORMAL HIGH (ref 0.44–1.00)
GFR, Est AFR Am: 18 mL/min — ABNORMAL LOW (ref >60)
GFR, Estimated: 15 mL/min — ABNORMAL LOW (ref >60)
Glucose, Bld: 177 mg/dL — ABNORMAL HIGH (ref 70–99)
Potassium: 4.6 mmol/L (ref 3.5–5.1)
Sodium: 142 mmol/L (ref 135–145)
Total Bilirubin: 0.5 mg/dL (ref 0.3–1.2)
Total Protein: 7.3 g/dL (ref 6.5–8.1)

## 2019-11-10 LAB — CBC WITH DIFFERENTIAL (CANCER CENTER ONLY)
Abs Immature Granulocytes: 0 10*3/uL (ref 0.00–0.07)
Basophils Absolute: 0 10*3/uL (ref 0.0–0.1)
Basophils Relative: 1 %
Eosinophils Absolute: 0.4 10*3/uL (ref 0.0–0.5)
Eosinophils Relative: 7 %
HCT: 31.2 % — ABNORMAL LOW (ref 36.0–46.0)
Hemoglobin: 9.9 g/dL — ABNORMAL LOW (ref 12.0–15.0)
Immature Granulocytes: 0 %
Lymphocytes Relative: 32 %
Lymphs Abs: 2 10*3/uL (ref 0.7–4.0)
MCH: 30.7 pg (ref 26.0–34.0)
MCHC: 31.7 g/dL (ref 30.0–36.0)
MCV: 96.9 fL (ref 80.0–100.0)
Monocytes Absolute: 0.4 10*3/uL (ref 0.1–1.0)
Monocytes Relative: 6 %
Neutro Abs: 3.3 10*3/uL (ref 1.7–7.7)
Neutrophils Relative %: 54 %
Platelet Count: 168 10*3/uL (ref 150–400)
RBC: 3.22 MIL/uL — ABNORMAL LOW (ref 3.87–5.11)
RDW: 14.3 % (ref 11.5–15.5)
WBC Count: 6 10*3/uL (ref 4.0–10.5)
nRBC: 0 % (ref 0.0–0.2)

## 2019-11-10 MED ORDER — DENOSUMAB 60 MG/ML ~~LOC~~ SOSY
60.0000 mg | PREFILLED_SYRINGE | Freq: Once | SUBCUTANEOUS | Status: AC
  Administered 2019-11-10: 60 mg via SUBCUTANEOUS

## 2019-11-10 NOTE — Patient Instructions (Signed)
Denosumab injection What is this medicine? DENOSUMAB (den oh sue mab) slows bone breakdown. Prolia is used to treat osteoporosis in women after menopause and in men, and in people who are taking corticosteroids for 6 months or more. Xgeva is used to treat a high calcium level due to cancer and to prevent bone fractures and other bone problems caused by multiple myeloma or cancer bone metastases. Xgeva is also used to treat giant cell tumor of the bone. This medicine may be used for other purposes; ask your health care provider or pharmacist if you have questions. COMMON BRAND NAME(S): Prolia, XGEVA What should I tell my health care provider before I take this medicine? They need to know if you have any of these conditions:  dental disease  having surgery or tooth extraction  infection  kidney disease  low levels of calcium or Vitamin D in the blood  malnutrition  on hemodialysis  skin conditions or sensitivity  thyroid or parathyroid disease  an unusual reaction to denosumab, other medicines, foods, dyes, or preservatives  pregnant or trying to get pregnant  breast-feeding How should I use this medicine? This medicine is for injection under the skin. It is given by a health care professional in a hospital or clinic setting. A special MedGuide will be given to you before each treatment. Be sure to read this information carefully each time. For Prolia, talk to your pediatrician regarding the use of this medicine in children. Special care may be needed. For Xgeva, talk to your pediatrician regarding the use of this medicine in children. While this drug may be prescribed for children as young as 13 years for selected conditions, precautions do apply. Overdosage: If you think you have taken too much of this medicine contact a poison control center or emergency room at once. NOTE: This medicine is only for you. Do not share this medicine with others. What if I miss a dose? It is  important not to miss your dose. Call your doctor or health care professional if you are unable to keep an appointment. What may interact with this medicine? Do not take this medicine with any of the following medications:  other medicines containing denosumab This medicine may also interact with the following medications:  medicines that lower your chance of fighting infection  steroid medicines like prednisone or cortisone This list may not describe all possible interactions. Give your health care provider a list of all the medicines, herbs, non-prescription drugs, or dietary supplements you use. Also tell them if you smoke, drink alcohol, or use illegal drugs. Some items may interact with your medicine. What should I watch for while using this medicine? Visit your doctor or health care professional for regular checks on your progress. Your doctor or health care professional may order blood tests and other tests to see how you are doing. Call your doctor or health care professional for advice if you get a fever, chills or sore throat, or other symptoms of a cold or flu. Do not treat yourself. This drug may decrease your body's ability to fight infection. Try to avoid being around people who are sick. You should make sure you get enough calcium and vitamin D while you are taking this medicine, unless your doctor tells you not to. Discuss the foods you eat and the vitamins you take with your health care professional. See your dentist regularly. Brush and floss your teeth as directed. Before you have any dental work done, tell your dentist you are   receiving this medicine. Do not become pregnant while taking this medicine or for 5 months after stopping it. Talk with your doctor or health care professional about your birth control options while taking this medicine. Women should inform their doctor if they wish to become pregnant or think they might be pregnant. There is a potential for serious side  effects to an unborn child. Talk to your health care professional or pharmacist for more information. What side effects may I notice from receiving this medicine? Side effects that you should report to your doctor or health care professional as soon as possible:  allergic reactions like skin rash, itching or hives, swelling of the face, lips, or tongue  bone pain  breathing problems  dizziness  jaw pain, especially after dental work  redness, blistering, peeling of the skin  signs and symptoms of infection like fever or chills; cough; sore throat; pain or trouble passing urine  signs of low calcium like fast heartbeat, muscle cramps or muscle pain; pain, tingling, numbness in the hands or feet; seizures  unusual bleeding or bruising  unusually weak or tired Side effects that usually do not require medical attention (report to your doctor or health care professional if they continue or are bothersome):  constipation  diarrhea  headache  joint pain  loss of appetite  muscle pain  runny nose  tiredness  upset stomach This list may not describe all possible side effects. Call your doctor for medical advice about side effects. You may report side effects to FDA at 1-800-FDA-1088. Where should I keep my medicine? This medicine is only given in a clinic, doctor's office, or other health care setting and will not be stored at home. NOTE: This sheet is a summary. It may not cover all possible information. If you have questions about this medicine, talk to your doctor, pharmacist, or health care provider.  2020 Elsevier/Gold Standard (2017-06-27 16:10:44)

## 2019-11-12 DIAGNOSIS — D6869 Other thrombophilia: Secondary | ICD-10-CM | POA: Diagnosis not present

## 2019-11-12 DIAGNOSIS — E1121 Type 2 diabetes mellitus with diabetic nephropathy: Secondary | ICD-10-CM | POA: Diagnosis not present

## 2019-11-12 DIAGNOSIS — N184 Chronic kidney disease, stage 4 (severe): Secondary | ICD-10-CM | POA: Diagnosis not present

## 2019-11-12 DIAGNOSIS — I480 Paroxysmal atrial fibrillation: Secondary | ICD-10-CM | POA: Diagnosis not present

## 2019-11-15 ENCOUNTER — Ambulatory Visit (INDEPENDENT_AMBULATORY_CARE_PROVIDER_SITE_OTHER): Payer: Medicare Other | Admitting: *Deleted

## 2019-11-15 DIAGNOSIS — I480 Paroxysmal atrial fibrillation: Secondary | ICD-10-CM

## 2019-11-15 LAB — CUP PACEART REMOTE DEVICE CHECK
Date Time Interrogation Session: 20210905233445
Implantable Pulse Generator Implant Date: 20190614

## 2019-11-17 NOTE — Progress Notes (Signed)
[facility] Summary Report / Loop Recorder 

## 2019-11-18 ENCOUNTER — Other Ambulatory Visit: Payer: Self-pay

## 2019-11-18 ENCOUNTER — Encounter: Payer: Self-pay | Admitting: Sports Medicine

## 2019-11-18 ENCOUNTER — Ambulatory Visit (INDEPENDENT_AMBULATORY_CARE_PROVIDER_SITE_OTHER): Payer: Medicare Other | Admitting: Sports Medicine

## 2019-11-18 DIAGNOSIS — E119 Type 2 diabetes mellitus without complications: Secondary | ICD-10-CM | POA: Diagnosis not present

## 2019-11-18 DIAGNOSIS — M79675 Pain in left toe(s): Secondary | ICD-10-CM | POA: Diagnosis not present

## 2019-11-18 DIAGNOSIS — I739 Peripheral vascular disease, unspecified: Secondary | ICD-10-CM

## 2019-11-18 DIAGNOSIS — M79674 Pain in right toe(s): Secondary | ICD-10-CM

## 2019-11-18 DIAGNOSIS — B351 Tinea unguium: Secondary | ICD-10-CM

## 2019-11-18 NOTE — Progress Notes (Signed)
Subjective: Sabrina [name] is a 86 y.o. female patient with history of diabetes who presents to office today complaining of long,mildly painful nails while in shoes; unable to trim. Patient denies any changes with medical history. Admits that her 24 year old sister died last week.  No other pedal complaints noted.   Patient is assisted by facility aide again this visit.   Patient Active Problem List   Diagnosis Date Noted  . Secondary hypercoagulable state ([place]) 08/16/2019  . Osteoporosis 05/04/2019  . Hypercalcemia 11/04/2018  . History of stroke 11/04/2018  . Paroxysmal atrial fibrillation ([place]) 11/04/2018  . Acute kidney injury ([place])   . Stage IV breast cancer in female [facility])   . Acute metabolic encephalopathy 16/12/9602  . Personal history of DVT and pulm embolus 10/26/2018  . Personal history of other venous thrombosis and embolism 10/26/2018  . UTI (urinary tract infection) 10/24/2018  . Clavicle fracture 09/26/2018  . Degeneration of lumbar intervertebral disc 10/01/2017  . Blurry vision, bilateral   . Acute blood loss anemia   . History of breast cancer 05/30/2017  . Physical debility 05/30/2017  . Occipital infarction ([place]) 05/30/2017  . Pressure injury of skin 05/28/2017  . Fall at home 05/27/2017  . CKD (chronic kidney disease), stage IV ([place]) 05/27/2017  . High cholesterol 05/27/2017  . DM (diabetes mellitus) ([place]) 05/27/2017  . Hypertension 05/27/2017  . Gout 05/27/2017  . Elevated troponin 05/27/2017  . Lumbar radiculopathy 05/15/2017  . Pain of right calf 04/30/2017  . Asymmetrical left sensorineural hearing loss 02/14/2016  . Impacted cerumen of left ear 02/14/2016  . Lipoma of lower extremity 09/12/2014  . Abnormal x-ray 03/15/2014  . Chronic diastolic congestive heart failure ([place]) 02/23/2014  . Candidiasis of skin 01/13/2014  . Osteopenia 11/30/2013  . Hypoglycemia 09/13/2013  . Acute respiratory failure with hypoxia ([place]) 06/14/2013  . History of  pulmonary embolism 06/10/2013  . Nausea and vomiting 06/10/2013  . Accelerated hypertension 06/10/2013  . Candidiasis of vagina 05/19/2013  . Aortic stenosis 04/22/2013  . Edema leg 04/22/2013  . Breast cancer of upper-outer quadrant of right female breast ([place]); in remission 03/19/2013   Current Outpatient Medications on File Prior to Visit  Medication Sig Dispense Refill  . albuterol (VENTOLIN HFA) 108 (90 Base) MCG/ACT inhaler SMARTSIG:2 Puff(s) By Mouth Every 6 Hours    . allopurinol (ZYLOPRIM) 100 MG tablet Take 100 mg by mouth daily.     [name] apixaban (ELIQUIS) 2.5 MG TABS tablet Take 1 tablet (2.5 mg total) by mouth 2 (two) times daily. 60 tablet 11  . cholecalciferol (VITAMIN D) 1000 UNITS tablet Take 1,000 Units by mouth daily.    . cloNIDine (CATAPRES) 0.2 MG tablet Take 0.2 mg by mouth 2 (two) times daily.    . colchicine (COLCRYS) 0.6 MG tablet Colcrys 0.6 mg tablet    . denosumab (PROLIA) 60 MG/ML SOSY injection Inject 60 mg into the skin every 6 (six) months.    . famotidine (PEPCID) 20 MG tablet Take 20 mg by mouth daily.    . furosemide (LASIX) 40 MG tablet Take 40 mg by mouth daily.    [name] gabapentin (NEURONTIN) 100 MG capsule Take 3 capsules (300 mg total) by mouth at bedtime. Take 200 mg by mouth twice a day and take 300 mg by mouth at bedtime    . glipiZIDE (GLUCOTROL) 5 MG tablet Take 5 mg by mouth 2 (two) times daily before a meal.     . hydrALAZINE (APRESOLINE) 10  MG tablet Take 1 tablet (10 mg total) by mouth 3 (three) times daily. 90 tablet 2  . hydrALAZINE (APRESOLINE) 25 MG tablet Take 25 mg by mouth 3 (three) times daily.    . hydrocortisone cream 1 % Apply 1 application topically daily as needed for itching.    . metFORMIN (GLUCOPHAGE) 850 MG tablet metformin 850 mg tablet    . nystatin ointment (MYCOSTATIN) nystatin 100,000 unit/gram topical ointment    . nystatin-triamcinolone (MYCOLOG II) cream nystatin-triamcinolone 100,000 unit/g-0.1 % topical cream    .  ofloxacin (OCUFLOX) 0.3 % ophthalmic solution ofloxacin 0.3 % eye drops    . ondansetron (ZOFRAN-ODT) 8 MG disintegrating tablet Take by mouth at bedtime.    . pantoprazole (PROTONIX) 20 MG tablet Take 20 mg by mouth daily.    . polyethylene glycol (MIRALAX / GLYCOLAX) packet Take 17 g by mouth daily.     [name] SSD 1 % cream     . traMADol (ULTRAM) 50 MG tablet Take 50 mg by mouth 2 (two) times daily as needed for moderate pain.     . TRUE METRIX BLOOD GLUCOSE TEST test strip     . TRUEplus Lancets 28G MISC Apply topically.    . valACYclovir (VALTREX) 500 MG tablet valacyclovir 500 mg tablet     No current facility-administered medications on file prior to visit.   Allergies  Allergen Reactions  . Beta Adrenergic Blockers     Objective: General: Patient is awake, alert, and oriented x 3 and in no acute distress.  Integument: Skin is warm, dry and supple bilateral. Nails are tender, long, thickened and dystrophic with subungual debris, consistent with onychomycosis, 1-5 bilateral. No signs of infection. No open lesions or preulcerative lesions present bilateral. Remaining integument unremarkable.  Vasculature:  Dorsalis Pedis pulse 1/4 bilateral. Posterior Tibial pulse  0/4 bilateral. Capillary fill time <5 sec 1-5 bilateral. Scant hair growth to the level of the digits.Temperature gradient within normal limits. No varicosities present bilateral.  1+ pitting edema present bilateral.   Neurology: The patient has intact sensation measured with a 5.07/10g Semmes Weinstein Monofilament at all pedal sites bilateral like previous.  Musculoskeletal:Asymptomatic bunion and hammertoe pedal deformities noted bilateral. Muscular strength 5/5 in all lower extremity muscular groups bilateral without pain on range of motion. No tenderness with calf compression bilateral.   Assessment and Plan: Problem List Items Addressed This Visit    None    Visit Diagnoses    Pain due to onychomycosis of toenails of  both feet    -  Primary   PVD (peripheral vascular disease) ([place])       Diabetes mellitus without complication ([place])         -Examined patient. -Re-Discussed and educated patient on diabetic foot care -Mechanically debrided all nails 1-5 bilateral using sterile nail nipper and filed with dremel without incident -Continue with elevation for edema control  -Patient to return  in 3 months for at risk foot care -Patient advised to call the office if any problems or questions arise in the meantime.  Landis Martins, DPM

## 2019-11-29 DIAGNOSIS — I129 Hypertensive chronic kidney disease with stage 1 through stage 4 chronic kidney disease, or unspecified chronic kidney disease: Secondary | ICD-10-CM | POA: Diagnosis not present

## 2019-11-29 DIAGNOSIS — E1122 Type 2 diabetes mellitus with diabetic chronic kidney disease: Secondary | ICD-10-CM | POA: Diagnosis not present

## 2019-11-29 DIAGNOSIS — D631 Anemia in chronic kidney disease: Secondary | ICD-10-CM | POA: Diagnosis not present

## 2019-11-29 DIAGNOSIS — N183 Chronic kidney disease, stage 3 unspecified: Secondary | ICD-10-CM | POA: Diagnosis not present

## 2019-12-07 ENCOUNTER — Ambulatory Visit: Payer: Medicare Other | Attending: Internal Medicine

## 2019-12-07 DIAGNOSIS — Z23 Encounter for immunization: Secondary | ICD-10-CM

## 2019-12-07 NOTE — Progress Notes (Signed)
   Covid-19 Vaccination Clinic  Name:  Sabrina [name]    MRN: [id number] DOB: 05-07-1926  12/07/2019  Sabrina [name] was observed post Covid-19 immunization for 15 minutes without incident. She was provided with Vaccine Information Sheet and instruction to access the V-Safe system.   Sabrina [name] was instructed to call 911 with any severe reactions post vaccine: [name] Difficulty breathing  . Swelling of face and throat  . A fast heartbeat  . A bad rash all over body  . Dizziness and weakness

## 2019-12-11 LAB — CUP PACEART REMOTE DEVICE CHECK
Date Time Interrogation Session: 20211008233759
Implantable Pulse Generator Implant Date: 20190614

## 2019-12-20 ENCOUNTER — Ambulatory Visit (INDEPENDENT_AMBULATORY_CARE_PROVIDER_SITE_OTHER): Payer: Medicare Other

## 2019-12-20 DIAGNOSIS — I639 Cerebral infarction, unspecified: Secondary | ICD-10-CM

## 2019-12-24 DIAGNOSIS — R1900 Intra-abdominal and pelvic swelling, mass and lump, unspecified site: Secondary | ICD-10-CM | POA: Diagnosis not present

## 2019-12-24 DIAGNOSIS — N184 Chronic kidney disease, stage 4 (severe): Secondary | ICD-10-CM | POA: Diagnosis not present

## 2019-12-24 DIAGNOSIS — I129 Hypertensive chronic kidney disease with stage 1 through stage 4 chronic kidney disease, or unspecified chronic kidney disease: Secondary | ICD-10-CM | POA: Diagnosis not present

## 2019-12-24 NOTE — Progress Notes (Signed)
[facility] Summary Report / Loop Recorder 

## 2019-12-29 DIAGNOSIS — R1900 Intra-abdominal and pelvic swelling, mass and lump, unspecified site: Secondary | ICD-10-CM | POA: Diagnosis not present

## 2020-01-24 ENCOUNTER — Ambulatory Visit (INDEPENDENT_AMBULATORY_CARE_PROVIDER_SITE_OTHER): Payer: Medicare Other

## 2020-01-24 DIAGNOSIS — I639 Cerebral infarction, unspecified: Secondary | ICD-10-CM | POA: Diagnosis not present

## 2020-01-24 LAB — CUP PACEART REMOTE DEVICE CHECK
Date Time Interrogation Session: 20211121232751
Implantable Pulse Generator Implant Date: 20190614

## 2020-01-25 NOTE — Progress Notes (Signed)
[facility] Summary Report / Loop Recorder 

## 2020-02-15 NOTE — Progress Notes (Signed)
Primary Care Physician: [name], MD Primary Electrophysiologist: Dr Lovena Le Referring Physician: Dr [facility]   [facility] Sabrina [name] is a 86 y.o. female with a history of hypertension, breast cancer, chemotherapy-induced DVT and PE, type 2 diabetes on oral hypoglycemics, hyperlipidemia, chronic debility, stage III chronic kidney disease and CVA who presents for follow up in the [facility].  The patient was initially diagnosed with atrial fibrillation on her ILR on 09/26/18. She was asymptomatic during this episode. She was actually hospitalized at the time for a mechanical fall and clavicle fracture.   On follow up today, patient reports she has been doing well from a cardiac standpoint. Her ILR shows no new episodes of afib. She does have episodes of facial flushing and nausea with vomiting. She takes PRN Zofran.   Today, she denies symptoms of palpitations, chest pain, shortness of breath, orthopnea, PND, dizziness, presyncope, syncope, snoring, daytime somnolence, bleeding, or neurologic sequela. The patient is tolerating medications without difficulties and is otherwise without complaint today.    Atrial Fibrillation Risk Factors:  she does not have symptoms or diagnosis of sleep apnea.  she has a BMI of Body mass index is 34.05 kg/m.[name] Filed Weights   02/16/20 1037  Weight: 76.5 kg    Family History  Problem Relation Age of Onset  . Pneumonia Mother   . Heart attack Father   . Breast cancer Other 2       niece  . Breast cancer Other 63       niece  . Ovarian cancer Other        niece     Atrial Fibrillation Management history:  Previous antiarrhythmic drugs: none Previous cardioversions: none Previous ablations: none CHADS2VASC score: 7 Anticoagulation history: Eliquis   Past Medical History:  Diagnosis Date  . Arthritis    "mostly in my hands, lower back" (06/25/2017)  . Basal cell carcinoma (BCC) of face 1983  .  Breast cancer, right breast ([place]) 03/2013  . Chronic kidney disease (CKD), stage III (moderate) ([facility])    nephrologist, Dr. [name]  . Dental crowns present   . DVT (deep venous thrombosis) ([place]) ~ 06/2013   "? side"  . Gout    "on daily RX" (06/25/2017)  . Heart murmur    no known problems; states did not know she had murmur until age 86  . High cholesterol   . Hypertension    fluctuates, especially when stressed; has been on med. > 20 yr.  . Immature cataract   . Non-insulin dependent type 2 diabetes mellitus ([place])   . Personal history of chemotherapy   . Personal history of radiation therapy   . Pulmonary embolism ([place]) ~ 06/2013  . Radiation 09/06/13-10/20/13   Right Breast Cancer  . Stroke ([place]) 05/2017   just visual problems since (06/25/2017)  . Wears partial dentures    lower   Past Surgical History:  Procedure Laterality Date  . AXILLARY LYMPH NODE DISSECTION Right 03/30/2013   Procedure: AXILLARY LYMPH NODE DISSECTION;  Surgeon: Rolm Bookbinder, MD;  Location: [place] [facility];  Service: General;  Laterality: Right;  . BASAL CELL CARCINOMA EXCISION  1983   "face"  . BREAST BIOPSY Right 03/17/2013  . BREAST CYST EXCISION Right 11/1958   benign  . BREAST LUMPECTOMY Right 03/30/2013  . BREAST LUMPECTOMY WITH NEEDLE LOCALIZATION Right 03/30/2013   Procedure: BREAST LUMPECTOMY WITH NEEDLE LOCALIZATION;  Surgeon: Rolm Bookbinder, MD;  Location: [place];  Service: General;  Laterality: Right;  . DILATION AND CURETTAGE OF UTERUS    . LOOP RECORDER INSERTION N/A 08/15/2017   Procedure: LOOP RECORDER INSERTION;  Surgeon: Evans Lance, MD;  Location: [place] [facility];  Service: Cardiovascular;  Laterality: N/A;  . PORT-A-CATH REMOVAL  2016  . PORTACATH PLACEMENT N/A 04/15/2013   Procedure: INSERTION PORT-A-CATH;  Surgeon: Rolm Bookbinder, MD;  Location: [place];  Service: General;  Laterality: N/A;  . RE-EXCISION OF BREAST CANCER,SUPERIOR MARGINS Right  04/15/2013   Procedure: RE-EXCISION OF RIGHT BREAST  MARGINS;  Surgeon: Rolm Bookbinder, MD;  Location: [place];  Service: General;  Laterality: Right;  . TONSILLECTOMY  ~ 1935/1936    Current Outpatient Medications  Medication Sig Dispense Refill  . albuterol (VENTOLIN HFA) 108 (90 Base) MCG/ACT inhaler SMARTSIG:2 Puff(s) By Mouth Every 6 Hours    . allopurinol (ZYLOPRIM) 100 MG tablet Take 100 mg by mouth daily.     [name] apixaban (ELIQUIS) 2.5 MG TABS tablet Take 1 tablet (2.5 mg total) by mouth 2 (two) times daily. 60 tablet 11  . cholecalciferol (VITAMIN D) 1000 UNITS tablet Take 1,000 Units by mouth daily.    . cloNIDine (CATAPRES) 0.2 MG tablet Take 0.2 mg by mouth 2 (two) times daily.    . colchicine 0.6 MG tablet Colcrys 0.6 mg tablet    . denosumab (PROLIA) 60 MG/ML SOSY injection Inject 60 mg into the skin every 6 (six) months.    . famotidine (PEPCID) 20 MG tablet Take 20 mg by mouth daily.    . furosemide (LASIX) 40 MG tablet Take 40 mg by mouth daily.    [name] gabapentin (NEURONTIN) 100 MG capsule Take 3 capsules (300 mg total) by mouth at bedtime. Take 200 mg by mouth twice a day and take 300 mg by mouth at bedtime    . glipiZIDE (GLUCOTROL) 5 MG tablet Take 5 mg by mouth 2 (two) times daily before a meal.     . hydrALAZINE (APRESOLINE) 25 MG tablet Take 25 mg by mouth 3 (three) times daily.    . hydrocortisone cream 1 % Apply 1 application topically daily as needed for itching.    . nystatin ointment (MYCOSTATIN) nystatin 100,000 unit/gram topical ointment    . nystatin-triamcinolone (MYCOLOG II) cream nystatin-triamcinolone 100,000 unit/g-0.1 % topical cream    . ofloxacin (OCUFLOX) 0.3 % ophthalmic solution ofloxacin 0.3 % eye drops    . ondansetron (ZOFRAN-ODT) 8 MG disintegrating tablet Take by mouth at bedtime.    . polyethylene glycol (MIRALAX / GLYCOLAX) packet Take 17 g by mouth daily.    [name] SSD 1 % cream     . traMADol (ULTRAM) 50 MG tablet Take 50 mg by  mouth 2 (two) times daily as needed for moderate pain.     . TRUE METRIX BLOOD GLUCOSE TEST test strip     . TRUEplus Lancets 28G MISC Apply topically.    . valACYclovir (VALTREX) 500 MG tablet valacyclovir 500 mg tablet    . hydrALAZINE (APRESOLINE) 10 MG tablet Take 1 tablet (10 mg total) by mouth 3 (three) times daily. 90 tablet 2   No current facility-administered medications for this encounter.    Allergies  Allergen Reactions  . Beta Adrenergic Blockers     Social History   Socioeconomic History  . Marital status: Married    Spouse name: Not on file  . Number of children: 0  . Years of education: Not on file  . Highest education  level: Bachelor's degree (e.g., BA, AB, BS)  Occupational History  . Not on file  Tobacco Use  . Smoking status: Never Smoker  . Smokeless tobacco: Never Used  . Tobacco comment: only smoked 2 packs cigarettes total; husband quit in [place] Use  . Vaping Use: Never used  Substance and Sexual Activity  . Alcohol use: Not Currently    Alcohol/week: 3.0 standard drinks    Types: 3 Glasses of wine per week  . Drug use: No  . Sexual activity: Not Currently  Other Topics Concern  . Not on file  Social History Narrative   Lives at home with her husband   Right handed   Caffeine: 1 coffee daily at most    Social Determinants of Health   Financial Resource Strain: Not on file  Food Insecurity: Not on file  Transportation Needs: Not on file  Physical Activity: Not on file  Stress: Not on file  Social Connections: Not on file  Intimate Partner Violence: Not on file     ROS- All systems are reviewed and negative except as per the HPI above.  Physical Exam: Vitals:   02/16/20 1037  BP: (!) 142/50  Pulse: 64  Weight: 76.5 kg  Height: 4\' 11"  (1.499 m)    GEN- The patient is well appearing elderly obese female, alert and oriented x 3 today.   HEENT-head normocephalic, atraumatic, sclera clear, conjunctiva pink, hearing intact,  trachea midline. Lungs- Clear to ausculation bilaterally, normal work of breathing Heart- Regular rate and rhythm, no murmurs, rubs or gallops  GI- soft, NT, ND, + BS Extremities- no clubbing, cyanosis, or edema MS- no significant deformity or atrophy Skin- no rash or lesion Psych- euthymic mood, full affect Neuro- strength and sensation are intact   Wt Readings from Last 3 Encounters:  02/16/20 76.5 kg  08/16/19 78.5 kg  05/10/19 72.9 kg    EKG today demonstrates SR HR 64, PR 182, QRS 90, QTc 433  Echo 05/27/17 demonstrated  - Procedure narrative: Transthoracic echocardiography. Image   quality was poor. The study was technically difficult, as a   result of poor sound wave transmission. - Left ventricle: The cavity size was normal. Wall thickness was   increased in a pattern of mild LVH. There was mild concentric   hypertrophy. Systolic function was normal. The estimated ejection   fraction was in the range of 55% to 60%. Wall motion was normal;   there were no regional wall motion abnormalities. Doppler   parameters are consistent with abnormal left ventricular   relaxation (grade 1 diastolic dysfunction). - Aortic valve: There was mild stenosis. Valve area (VTI): 1.75   cm^2. Valve area (Vmax): 1.8 cm^2. Valve area (Vmean): 1.7 cm^2. - Mitral valve: Severely calcified annulus. Valve area by   continuity equation (using LVOT flow): 1.95 cm^2. - Atrial septum: No defect or patent foramen ovale was identified.  Epic records are reviewed at length today  Assessment and Plan:  1. Paroxysmal atrial fibrillation 0% afib burden on ILR Continue Eliquis 2.5 mg BID (age, Cr 2.58)  This patients CHA2DS2-VASc Score and unadjusted Ischemic Stroke Rate (% per year) is equal to 11.2 % stroke rate/year from a score of 7  Above score calculated as 1 point each if present [CHF, HTN, DM, Vascular=MI/PAD/Aortic Plaque, Age if 65-74, or Female] Above score calculated as 2 points each if  present [Age > 75, or Stroke/TIA/TE]  2. HTN Stable, no changes today.  3. Nausea/vomiting Having sudden  onset symptoms of N/V. Encouraged her to f/u with PCP.    Follow up in the AF clinic in 6 months.    [facility] [address] [place], [place] [address] [phone] 02/16/2020 10:47 AM

## 2020-02-16 ENCOUNTER — Encounter (HOSPITAL_COMMUNITY): Payer: Self-pay | Admitting: Physician Assistant

## 2020-02-16 ENCOUNTER — Ambulatory Visit (HOSPITAL_COMMUNITY)
Admission: RE | Admit: 2020-02-16 | Discharge: 2020-02-16 | Disposition: A | Discharge status: Home / Self Care | Payer: Medicare Other | Source: Ambulatory Visit | Attending: Physician Assistant | Admitting: Physician Assistant

## 2020-02-16 ENCOUNTER — Other Ambulatory Visit: Payer: Self-pay

## 2020-02-16 VITALS — BP 142/50 | HR 64 | Ht 59.0 in | Wt 168.6 lb

## 2020-02-16 DIAGNOSIS — Z853 Personal history of malignant neoplasm of breast: Secondary | ICD-10-CM | POA: Insufficient documentation

## 2020-02-16 DIAGNOSIS — R232 Flushing: Secondary | ICD-10-CM | POA: Insufficient documentation

## 2020-02-16 DIAGNOSIS — I82409 Acute embolism and thrombosis of unspecified deep veins of unspecified lower extremity: Secondary | ICD-10-CM | POA: Diagnosis not present

## 2020-02-16 DIAGNOSIS — D6869 Other thrombophilia: Secondary | ICD-10-CM

## 2020-02-16 DIAGNOSIS — Z86718 Personal history of other venous thrombosis and embolism: Secondary | ICD-10-CM | POA: Diagnosis not present

## 2020-02-16 DIAGNOSIS — I130 Hypertensive heart and chronic kidney disease with heart failure and stage 1 through stage 4 chronic kidney disease, or unspecified chronic kidney disease: Secondary | ICD-10-CM | POA: Insufficient documentation

## 2020-02-16 DIAGNOSIS — Z7984 Long term (current) use of oral hypoglycemic drugs: Secondary | ICD-10-CM | POA: Diagnosis not present

## 2020-02-16 DIAGNOSIS — R112 Nausea with vomiting, unspecified: Secondary | ICD-10-CM | POA: Insufficient documentation

## 2020-02-16 DIAGNOSIS — R5381 Other malaise: Secondary | ICD-10-CM | POA: Insufficient documentation

## 2020-02-16 DIAGNOSIS — Z79899 Other long term (current) drug therapy: Secondary | ICD-10-CM | POA: Insufficient documentation

## 2020-02-16 DIAGNOSIS — I480 Paroxysmal atrial fibrillation: Secondary | ICD-10-CM | POA: Insufficient documentation

## 2020-02-16 DIAGNOSIS — Z8249 Family history of ischemic heart disease and other diseases of the circulatory system: Secondary | ICD-10-CM | POA: Diagnosis not present

## 2020-02-16 DIAGNOSIS — Z86711 Personal history of pulmonary embolism: Secondary | ICD-10-CM | POA: Insufficient documentation

## 2020-02-16 DIAGNOSIS — N183 Chronic kidney disease, stage 3 unspecified: Secondary | ICD-10-CM | POA: Insufficient documentation

## 2020-02-16 DIAGNOSIS — E785 Hyperlipidemia, unspecified: Secondary | ICD-10-CM | POA: Insufficient documentation

## 2020-02-16 DIAGNOSIS — E119 Type 2 diabetes mellitus without complications: Secondary | ICD-10-CM | POA: Insufficient documentation

## 2020-02-16 DIAGNOSIS — I639 Cerebral infarction, unspecified: Secondary | ICD-10-CM | POA: Insufficient documentation

## 2020-02-16 DIAGNOSIS — Z7901 Long term (current) use of anticoagulants: Secondary | ICD-10-CM | POA: Diagnosis not present

## 2020-02-17 ENCOUNTER — Ambulatory Visit (INDEPENDENT_AMBULATORY_CARE_PROVIDER_SITE_OTHER): Payer: Medicare Other | Admitting: Sports Medicine

## 2020-02-17 ENCOUNTER — Encounter: Payer: Self-pay | Admitting: Sports Medicine

## 2020-02-17 DIAGNOSIS — I739 Peripheral vascular disease, unspecified: Secondary | ICD-10-CM

## 2020-02-17 DIAGNOSIS — M79674 Pain in right toe(s): Secondary | ICD-10-CM

## 2020-02-17 DIAGNOSIS — B351 Tinea unguium: Secondary | ICD-10-CM | POA: Diagnosis not present

## 2020-02-17 DIAGNOSIS — E119 Type 2 diabetes mellitus without complications: Secondary | ICD-10-CM

## 2020-02-17 DIAGNOSIS — M79675 Pain in left toe(s): Secondary | ICD-10-CM

## 2020-02-17 NOTE — Progress Notes (Signed)
Subjective: Sabrina [name] is a 86 y.o. female patient with history of diabetes who presents to office today complaining of long,mildly painful nails while in shoes; unable to trim. Patient denies any changes with medical history. Still on lasix for swelling they have been giving double to help.  No other pedal complaints noted.   Patient is assisted by facility aide again this visit.   Patient Active Problem List   Diagnosis Date Noted  . Secondary hypercoagulable state ([place]) 08/16/2019  . Osteoporosis 05/04/2019  . Hypercalcemia 11/04/2018  . History of stroke 11/04/2018  . Paroxysmal atrial fibrillation ([place]) 11/04/2018  . Acute kidney injury ([place])   . Stage IV breast cancer in female [facility])   . Acute metabolic encephalopathy 93/71/6967  . Personal history of DVT and pulm embolus 10/26/2018  . Personal history of other venous thrombosis and embolism 10/26/2018  . UTI (urinary tract infection) 10/24/2018  . Clavicle fracture 09/26/2018  . Degeneration of lumbar intervertebral disc 10/01/2017  . Blurry vision, bilateral   . Acute blood loss anemia   . History of breast cancer 05/30/2017  . Physical debility 05/30/2017  . Occipital infarction ([place]) 05/30/2017  . Pressure injury of skin 05/28/2017  . Fall at home 05/27/2017  . CKD (chronic kidney disease), stage IV ([place]) 05/27/2017  . High cholesterol 05/27/2017  . DM (diabetes mellitus) ([place]) 05/27/2017  . Hypertension 05/27/2017  . Gout 05/27/2017  . Elevated troponin 05/27/2017  . Lumbar radiculopathy 05/15/2017  . Pain of right calf 04/30/2017  . Asymmetrical left sensorineural hearing loss 02/14/2016  . Impacted cerumen of left ear 02/14/2016  . Lipoma of lower extremity 09/12/2014  . Abnormal x-ray 03/15/2014  . Chronic diastolic congestive heart failure ([place]) 02/23/2014  . Candidiasis of skin 01/13/2014  . Osteopenia 11/30/2013  . Hypoglycemia 09/13/2013  . Acute respiratory failure with hypoxia ([place]) 06/14/2013   . History of pulmonary embolism 06/10/2013  . Nausea and vomiting 06/10/2013  . Accelerated hypertension 06/10/2013  . Candidiasis of vagina 05/19/2013  . Aortic stenosis 04/22/2013  . Edema leg 04/22/2013  . Breast cancer of upper-outer quadrant of right female breast ([place]); in remission 03/19/2013   Current Outpatient Medications on File Prior to Visit  Medication Sig Dispense Refill  . albuterol (VENTOLIN HFA) 108 (90 Base) MCG/ACT inhaler SMARTSIG:2 Puff(s) By Mouth Every 6 Hours    . allopurinol (ZYLOPRIM) 100 MG tablet Take 100 mg by mouth daily.     [name] apixaban (ELIQUIS) 2.5 MG TABS tablet Take 1 tablet (2.5 mg total) by mouth 2 (two) times daily. 60 tablet 11  . cholecalciferol (VITAMIN D) 1000 UNITS tablet Take 1,000 Units by mouth daily.    . cloNIDine (CATAPRES) 0.2 MG tablet Take 0.2 mg by mouth 2 (two) times daily.    . colchicine 0.6 MG tablet Colcrys 0.6 mg tablet    . denosumab (PROLIA) 60 MG/ML SOSY injection Inject 60 mg into the skin every 6 (six) months.    . famotidine (PEPCID) 20 MG tablet Take 20 mg by mouth daily.    . furosemide (LASIX) 40 MG tablet Take 40 mg by mouth daily.    [name] gabapentin (NEURONTIN) 100 MG capsule Take 3 capsules (300 mg total) by mouth at bedtime. Take 200 mg by mouth twice a day and take 300 mg by mouth at bedtime    . glipiZIDE (GLUCOTROL) 5 MG tablet Take 5 mg by mouth 2 (two) times daily before a meal.     . hydrALAZINE (APRESOLINE)  10 MG tablet Take 1 tablet (10 mg total) by mouth 3 (three) times daily. 90 tablet 2  . hydrALAZINE (APRESOLINE) 25 MG tablet Take 25 mg by mouth 3 (three) times daily.    . hydrocortisone cream 1 % Apply 1 application topically daily as needed for itching.    . nystatin ointment (MYCOSTATIN) nystatin 100,000 unit/gram topical ointment    . nystatin-triamcinolone (MYCOLOG II) cream nystatin-triamcinolone 100,000 unit/g-0.1 % topical cream    . ofloxacin (OCUFLOX) 0.3 % ophthalmic solution ofloxacin 0.3 % eye  drops    . ondansetron (ZOFRAN-ODT) 8 MG disintegrating tablet Take by mouth at bedtime.    . polyethylene glycol (MIRALAX / GLYCOLAX) packet Take 17 g by mouth daily.    [name] SSD 1 % cream     . traMADol (ULTRAM) 50 MG tablet Take 50 mg by mouth 2 (two) times daily as needed for moderate pain.     . TRUE METRIX BLOOD GLUCOSE TEST test strip     . TRUEplus Lancets 28G MISC Apply topically.    . valACYclovir (VALTREX) 500 MG tablet valacyclovir 500 mg tablet     No current facility-administered medications on file prior to visit.   Allergies  Allergen Reactions  . Beta Adrenergic Blockers     Objective: General: Patient is awake, alert, and oriented x 3 and in no acute distress.  Integument: Skin is warm, dry and supple bilateral. Nails are tender, long, thickened and dystrophic with subungual debris, consistent with onychomycosis, 1-5 bilateral. No signs of infection. No open lesions or preulcerative lesions present bilateral. Remaining integument unremarkable.  Vasculature:  Dorsalis Pedis pulse 1/4 bilateral. Posterior Tibial pulse  0/4 bilateral. Capillary fill time <5 sec 1-5 bilateral. Scant hair growth to the level of the digits.Temperature gradient within normal limits. No varicosities present bilateral.  1+ pitting edema present bilateral.   Neurology: Sensation present via light touch bilateral.   Musculoskeletal:Asymptomatic bunion and hammertoe pedal deformities noted bilateral. Muscular strength 5/5 in all lower extremity muscular groups bilateral without pain on range of motion. No tenderness with calf compression bilateral.   Assessment and Plan: Problem List Items Addressed This Visit   None   Visit Diagnoses    Pain due to onychomycosis of toenails of both feet    -  Primary   PVD (peripheral vascular disease) ([place])       Diabetes mellitus without complication ([place])         -Examined patient. -Re-Discussed and educated patient on diabetic foot care -Mechanically  debrided all nails 1-5 bilateral using sterile nail nipper and filed with dremel without incident -Continue with elevation for edema control and medication management -Patient to return  in 3 months for at risk foot care -Patient advised to call the office if any problems or questions arise in the meantime.  Landis Martins, DPM

## 2020-02-27 LAB — CUP PACEART REMOTE DEVICE CHECK
Date Time Interrogation Session: 20211224233314
Implantable Pulse Generator Implant Date: 20190614

## 2020-02-28 ENCOUNTER — Ambulatory Visit (INDEPENDENT_AMBULATORY_CARE_PROVIDER_SITE_OTHER): Payer: Medicare Other

## 2020-02-28 DIAGNOSIS — I639 Cerebral infarction, unspecified: Secondary | ICD-10-CM | POA: Diagnosis not present

## 2020-03-10 ENCOUNTER — Other Ambulatory Visit: Payer: Self-pay | Admitting: Geriatric Medicine

## 2020-03-10 DIAGNOSIS — Z1231 Encounter for screening mammogram for malignant neoplasm of breast: Secondary | ICD-10-CM

## 2020-03-13 NOTE — Progress Notes (Signed)
[facility] Summary Report / Loop Recorder 

## 2020-04-01 LAB — CUP PACEART REMOTE DEVICE CHECK
Date Time Interrogation Session: 20220126233928
Implantable Pulse Generator Implant Date: 20190614

## 2020-04-03 ENCOUNTER — Ambulatory Visit (INDEPENDENT_AMBULATORY_CARE_PROVIDER_SITE_OTHER): Payer: Medicare Other

## 2020-04-03 DIAGNOSIS — I639 Cerebral infarction, unspecified: Secondary | ICD-10-CM

## 2020-04-11 DIAGNOSIS — D6869 Other thrombophilia: Secondary | ICD-10-CM | POA: Diagnosis not present

## 2020-04-11 DIAGNOSIS — I129 Hypertensive chronic kidney disease with stage 1 through stage 4 chronic kidney disease, or unspecified chronic kidney disease: Secondary | ICD-10-CM | POA: Diagnosis not present

## 2020-04-11 DIAGNOSIS — I480 Paroxysmal atrial fibrillation: Secondary | ICD-10-CM | POA: Diagnosis not present

## 2020-04-11 DIAGNOSIS — E1121 Type 2 diabetes mellitus with diabetic nephropathy: Secondary | ICD-10-CM | POA: Diagnosis not present

## 2020-04-11 NOTE — Progress Notes (Signed)
[facility] Summary Report / Loop Recorder 

## 2020-04-19 ENCOUNTER — Other Ambulatory Visit: Payer: Self-pay

## 2020-04-19 ENCOUNTER — Ambulatory Visit
Admission: RE | Admit: 2020-04-19 | Discharge: 2020-04-19 | Disposition: A | Discharge status: Home / Self Care | Payer: Medicare Other | Source: Ambulatory Visit | Attending: Geriatric Medicine | Admitting: Geriatric Medicine

## 2020-04-19 DIAGNOSIS — Z1231 Encounter for screening mammogram for malignant neoplasm of breast: Secondary | ICD-10-CM

## 2020-05-08 ENCOUNTER — Ambulatory Visit (INDEPENDENT_AMBULATORY_CARE_PROVIDER_SITE_OTHER): Payer: Medicare Other

## 2020-05-08 DIAGNOSIS — I639 Cerebral infarction, unspecified: Secondary | ICD-10-CM

## 2020-05-08 NOTE — Progress Notes (Signed)
[facility]  [phone]) [phone] [phone]   ID: Summar [name] DOB: 04-14-1926  MR#: [id number]  JYN#:[id number]  Patient Care Team: Merlene Laughter, MD as PCP - General (Internal Medicine) Drue Second, MD as Consulting Physician (Oncology) Leanna Hamid, Valentino Hue, MD as Consulting Physician (Oncology) Emelia Loron, MD as Consulting Physician (General Surgery) Lurline Hare, MD as Referring Physician (Radiation Oncology) OTHER MD:    CHIEF COMPLAINT: Estrogen receptor positive breast cancer  CURRENT TREATMENT:  Observation  INTERVAL HISTORY: Sabrina [name] returns today for follow-up of her estrogen receptor positive breast cancer and osteoporosis. She continues under observation for her breast cancer. She is accompanied by one of her caregivers.  She receives denosumab for her osteoporosis, most recently on 11/10/2019.  She will receive her final dose today  Since her last visit, she underwent bilateral screening mammography with tomography at [facility] on 04/19/2020 showing: breast density category C; no evidence of malignancy in either breast.    REVIEW OF SYSTEMS: Sabrina [name] lives by herself, with caregivers present 7 days a week, from 9 in the morning to 3 in the afternoon and then from 5 in the evening to 8PM.  She has considered retirement community specifically [facility] but has not made a final decision.  It is very hard for her to get out and in particular what she would really like to do is to get to the [employer] but it is difficult for her.  A detailed review of systems was otherwise stable   COVID 19 VACCINATION STATUS: fully vaccinated [employer]), with booster 12/2019   BREAST CANCER HISTORY: From Dr. Konrad Dolores Khan's intake note 03/24/2013:  "Sabrina [name] is a 87 y.o. female. Who underwent a screening mammogram. She was found to have a right breast mass measuring 1 cm. There were no abnormalities on her physical exam.  Patient had ultrasound performed that showed 1.3 x 1.2 x 1.0 cm hypoechoic mass in the upper outer quadrant of the right breast. In the right axilla there was an abnormal appearing lymph node measuring 1.65 1.4 x 0.8 cm. Both her biopsy. The lymph node was positive for metastatic carcinoma. The primary breast tumor biopsy showed invasive ductal carcinoma, grade 2, ER positive PR positive HER-2/neu equivocal with a proliferation marker Ki-67 15%.."  On 03/30/2013 the patient underwent right lumpectomy in right axillary lymph node sampling. The pathology ([id number]) showed a 1.4 cm invasive ductal carcinoma grade 3, present at the lateral and posterior margins. 3 axillary lymph nodes (non-sentinel lymph nodes) were sampled, one of which had a micrometastatic tumor deposits with extracapsular extension. Repeat HER-2 was positive, with a signals ratio of 2.54 and the number per cell of 3.55.  The patient was started on weekly Taxol and trastuzumab, but tolerated treatment poorly and develop bilateral DVTs and pulmonary embolism April 2015. At that time her chemotherapy was interrupted.  Her subsequent history is as detailed below.    PAST MEDICAL HISTORY: Past Medical History:  Diagnosis Date  . Arthritis    "mostly in my hands, lower back" (06/25/2017)  . Basal cell carcinoma (BCC) of face 1983  . Breast cancer, right breast (HCC) 03/2013  . Chronic kidney disease (CKD), stage III (moderate) (HCC)    nephrologist, Dr. Annie Sable  . Dental crowns present   . DVT (deep venous thrombosis) (HCC) ~ 06/2013   "? side"  . Gout    "on daily RX" (06/25/2017)  . Heart murmur    no known  problems; states did not know she had murmur until age 28  . High cholesterol   . Hypertension    fluctuates, especially when stressed; has been on med. > 20 yr.  . Immature cataract   . Non-insulin dependent type 2 diabetes mellitus ([place])   . Personal history of chemotherapy   . Personal history of radiation  therapy   . Pulmonary embolism ([place]) ~ 06/2013  . Radiation 09/06/13-10/20/13   Right Breast Cancer  . Stroke ([place]) 05/2017   just visual problems since (06/25/2017)  . Wears partial dentures    lower    PAST SURGICAL HISTORY: Past Surgical History:  Procedure Laterality Date  . AXILLARY LYMPH NODE DISSECTION Right 03/30/2013   Procedure: AXILLARY LYMPH NODE DISSECTION;  Surgeon: Rolm Bookbinder, MD;  Location: [facility];  Service: General;  Laterality: Right;  . BASAL CELL CARCINOMA EXCISION  1983   "face"  . BREAST BIOPSY Right 03/17/2013  . BREAST CYST EXCISION Right 11/1958   benign  . BREAST LUMPECTOMY Right 03/30/2013  . BREAST LUMPECTOMY WITH NEEDLE LOCALIZATION Right 03/30/2013   Procedure: BREAST LUMPECTOMY WITH NEEDLE LOCALIZATION;  Surgeon: Rolm Bookbinder, MD;  Location: [facility];  Service: General;  Laterality: Right;  . DILATION AND CURETTAGE OF UTERUS    . LOOP RECORDER INSERTION N/A 08/15/2017   Procedure: LOOP RECORDER INSERTION;  Surgeon: Evans Lance, MD;  Location: [facility];  Service: Cardiovascular;  Laterality: N/A;  . PORT-A-CATH REMOVAL  2016  . PORTACATH PLACEMENT N/A 04/15/2013   Procedure: INSERTION PORT-A-CATH;  Surgeon: Rolm Bookbinder, MD;  [place] [facility]  Service: General;  Laterality: N/A;  . RE-EXCISION OF BREAST CANCER,SUPERIOR MARGINS Right 04/15/2013   Procedure: RE-EXCISION OF RIGHT BREAST  MARGINS;  Surgeon: Rolm Bookbinder, MD;  Location: [facility];  Service: General;  Laterality: Right;  . TONSILLECTOMY  ~ 1935/1936    FAMILY HISTORY Family History  Problem Relation Age of Onset  . Pneumonia Mother   . Heart attack Father   . Breast cancer Other 26       niece  . Breast cancer Other 44       niece  . Ovarian cancer Other        niece   the patient's father died in his 97s, in the setting of multiple medical problems. The patient's mother died in her 38s from pneumonia. The patient had 4  sisters, one of whom has died from complications of diabetes. She had a brother who died at 62 months. There is no history of breast cancer in the immediate family although the patient does have 2 nieces with breast cancer. The patient has been tested for the BRCA mutations and her genetics was normal   GYNECOLOGIC HISTORY:  No LMP recorded. Patient is postmenopausal. Menarche age 89, the patient is GX P0. She went through menopause in her 51s, took hormone replacement until she was in her 16s.    SOCIAL HISTORY: (Updated March 2022)  Sabrina [name] worked as the [employer] and locally. She and her husband [name] greatly enjoyed the [place] died with dementia 01/19/2019.  He worked for the daily news in [place] and played violin, formerly playing for the [employer], later for the [employer].  Breniyah lives by herself with caregivers 7 days a week coming in between 9 AM and 3 PM and 5 PM and 8 PM    ADVANCED DIRECTIVES: In place   HEALTH MAINTENANCE: Social  History   Tobacco Use  . Smoking status: Never Smoker  . Smokeless tobacco: Never Used  . Tobacco comment: only smoked 2 packs cigarettes total; husband quit in [place] Use  . Vaping Use: Never used  Substance Use Topics  . Alcohol use: Not Currently    Alcohol/week: 3.0 standard drinks    Types: 3 Glasses of wine per week  . Drug use: No     Colonoscopy:   PAP:   Bone density: 08/21/2015 showed T score of -2.2 osteopenia   Allergies  Allergen Reactions  . Beta Adrenergic Blockers     Current Outpatient Medications  Medication Sig Dispense Refill  . albuterol (VENTOLIN HFA) 108 (90 Base) MCG/ACT inhaler SMARTSIG:2 Puff(s) By Mouth Every 6 Hours    . allopurinol (ZYLOPRIM) 100 MG tablet Take 100 mg by mouth daily.     [name] apixaban (ELIQUIS) 2.5 MG TABS tablet Take 1 tablet (2.5 mg total) by mouth 2 (two) times daily. 60 tablet 11  . cholecalciferol (VITAMIN D) 1000 UNITS tablet Take  1,000 Units by mouth daily.    . cloNIDine (CATAPRES) 0.2 MG tablet Take 0.2 mg by mouth 2 (two) times daily.    . colchicine 0.6 MG tablet Colcrys 0.6 mg tablet    . denosumab (PROLIA) 60 MG/ML SOSY injection Inject 60 mg into the skin every 6 (six) months.    . famotidine (PEPCID) 20 MG tablet Take 20 mg by mouth daily.    . furosemide (LASIX) 40 MG tablet Take 40 mg by mouth daily.    [name] gabapentin (NEURONTIN) 100 MG capsule Take 3 capsules (300 mg total) by mouth at bedtime. Take 200 mg by mouth twice a day and take 300 mg by mouth at bedtime    . glipiZIDE (GLUCOTROL) 5 MG tablet Take 5 mg by mouth 2 (two) times daily before a meal.     . hydrALAZINE (APRESOLINE) 10 MG tablet Take 1 tablet (10 mg total) by mouth 3 (three) times daily. 90 tablet 2  . hydrALAZINE (APRESOLINE) 25 MG tablet Take 25 mg by mouth 3 (three) times daily.    . hydrocortisone cream 1 % Apply 1 application topically daily as needed for itching.    . nystatin ointment (MYCOSTATIN) nystatin 100,000 unit/gram topical ointment    . nystatin-triamcinolone (MYCOLOG II) cream nystatin-triamcinolone 100,000 unit/g-0.1 % topical cream    . ofloxacin (OCUFLOX) 0.3 % ophthalmic solution ofloxacin 0.3 % eye drops    . ondansetron (ZOFRAN-ODT) 8 MG disintegrating tablet Take by mouth at bedtime.    . polyethylene glycol (MIRALAX / GLYCOLAX) packet Take 17 g by mouth daily.    [name] SSD 1 % cream     . traMADol (ULTRAM) 50 MG tablet Take 50 mg by mouth 2 (two) times daily as needed for moderate pain.     . TRUE METRIX BLOOD GLUCOSE TEST test strip     . TRUEplus Lancets 28G MISC Apply topically.    . valACYclovir (VALTREX) 500 MG tablet valacyclovir 500 mg tablet     No current facility-administered medications for this visit.   Facility-Administered Medications Ordered in Other Visits  Medication Dose Route Frequency Provider Last Rate Last Admin  . denosumab (PROLIA) injection 60 mg  60 mg Subcutaneous Once Kijana Estock, Virgie Dad, MD         OBJECTIVE: white woman examined in a wheelchair Vitals:   05/09/20 1056  BP: (!) 155/51  Pulse: 86  Resp: 17  Temp: 97.7 F (  36.5 C)  SpO2: 97%     Body mass index is 34.26 kg/m.    ECOG FS:2 - Symptomatic, <50% confined to bed   Sclerae unicteric, EOMs intact Wearing a mask No cervical or supraclavicular adenopathy Lungs no rales or rhonchi Heart regular rate and rhythm Abd soft, obese, nontender, positive bowel sounds MSK kyphosis but no focal spinal tenderness Neuro: nonfocal, well oriented, appropriate affect Breasts: The right breast is status post lumpectomy with no evidence of local recurrence.  The left breast and both axillae are benign.   LAB RESULTS:  CMP     Component Value Date/Time   NA 142 11/10/2019 1014   NA 140 01/30/2017 1053   K 4.6 11/10/2019 1014   K 4.4 01/30/2017 1053   CL 104 11/10/2019 1014   CO2 30 11/10/2019 1014   CO2 26 01/30/2017 1053   GLUCOSE 177 (H) 11/10/2019 1014   GLUCOSE 277 (H) 01/30/2017 1053   BUN 93 (H) 11/10/2019 1014   BUN 74.3 (H) 01/30/2017 1053   CREATININE 2.64 (H) 11/10/2019 1014   CREATININE 2.2 (H) 01/30/2017 1053   CALCIUM 10.1 11/10/2019 1014   CALCIUM 10.2 01/30/2017 1053   PROT 7.3 11/10/2019 1014   PROT 7.0 01/30/2017 1053   ALBUMIN 3.7 11/10/2019 1014   ALBUMIN 3.6 01/30/2017 1053   AST 11 (L) 11/10/2019 1014   AST 13 01/30/2017 1053   ALT 8 11/10/2019 1014   ALT 12 01/30/2017 1053   ALKPHOS 75 11/10/2019 1014   ALKPHOS 94 01/30/2017 1053   BILITOT 0.5 11/10/2019 1014   BILITOT 0.50 01/30/2017 1053   GFRNONAA 15 (L) 11/10/2019 1014   GFRAA 18 (L) 11/10/2019 1014    I No results found for: SPEP  Lab Results  Component Value Date   WBC 7.0 05/09/2020   NEUTROABS 3.6 05/09/2020   HGB 10.3 (L) 05/09/2020   HCT 31.7 (L) 05/09/2020   MCV 97.8 05/09/2020   PLT 186 05/09/2020      Chemistry      Component Value Date/Time   NA 142 11/10/2019 1014   NA 140 01/30/2017 1053   K 4.6  11/10/2019 1014   K 4.4 01/30/2017 1053   CL 104 11/10/2019 1014   CO2 30 11/10/2019 1014   CO2 26 01/30/2017 1053   BUN 93 (H) 11/10/2019 1014   BUN 74.3 (H) 01/30/2017 1053   CREATININE 2.64 (H) 11/10/2019 1014   CREATININE 2.2 (H) 01/30/2017 1053      Component Value Date/Time   CALCIUM 10.1 11/10/2019 1014   CALCIUM 10.2 01/30/2017 1053   ALKPHOS 75 11/10/2019 1014   ALKPHOS 94 01/30/2017 1053   AST 11 (L) 11/10/2019 1014   AST 13 01/30/2017 1053   ALT 8 11/10/2019 1014   ALT 12 01/30/2017 1053   BILITOT 0.5 11/10/2019 1014   BILITOT 0.50 01/30/2017 1053      No results found for: LABCA2  No components found for: [id number]  No results for input(s): INR in the last 168 hours.  Urinalysis    Component Value Date/Time   COLORURINE STRAW (A) 11/04/2018 2234   APPEARANCEUR CLEAR 11/04/2018 2234   LABSPEC 1.006 11/04/2018 2234   PHURINE 7.0 11/04/2018 2234   GLUCOSEU 50 (A) 11/04/2018 2234   HGBUR NEGATIVE 11/04/2018 2234   BILIRUBINUR NEGATIVE 11/04/2018 2234   KETONESUR NEGATIVE 11/04/2018 2234   PROTEINUR 30 (A) 11/04/2018 2234   UROBILINOGEN 0.2 09/13/2013 0523   NITRITE NEGATIVE 11/04/2018 2234   LEUKOCYTESUR NEGATIVE 11/04/2018  2234    STUDIES: MM 3D SCREEN BREAST BILATERAL  Result Date: 04/24/2020 CLINICAL DATA:  Screening. EXAM: DIGITAL SCREENING BILATERAL MAMMOGRAM WITH TOMOSYNTHESIS AND CAD TECHNIQUE: Bilateral screening digital craniocaudal and mediolateral oblique mammograms were obtained. Bilateral screening digital breast tomosynthesis was performed. The images were evaluated with computer-aided detection. COMPARISON:  Previous exam(s). ACR Breast Density Category c: The breast tissue is heterogeneously dense, which may obscure small masses. FINDINGS: There are no findings suspicious for malignancy. IMPRESSION: No mammographic evidence of malignancy. A result letter of this screening mammogram will be mailed directly to the patient. RECOMMENDATION:  Screening mammogram in one year. (Code:SM-B-01Y) BI-RADS CATEGORY  1: Negative. Electronically Signed   By: Lovey Newcomer M.D.   On: 04/24/2020 14:52     ASSESSMENT: 87 y.o. [place] woman status post right lumpectomy and axillary lymph node sampling 03/30/2013 for a pT1c pN1a, stage IIA invasive ductal carcinoma, grade 3, estrogen receptor 100% positive, progesterone receptor 100% positive, with an MIB-1 of 15%, and HER-2 amplified, with a signals ratio of 2.54 and the number per cell being 3.55.  (a) margins were positive but cleared with additional surgery February 2015 ([id number])  (1) adjuvant chemotherapy /immunotherapy with paclitaxel and trastuzumab weekly started 05/10/2013, discontinued after 2 doses because of poor tolerance  (2) ventilation/perfusion scan 06/10/2013 documented right lower lobe pulmonary emboli; Doppler ultrasound 06/11/2013 documented bilateral lower extremity DVTs; started on warfarin managed through Dr. Carlyle Lipa office  (3) anastrozole started June 2015, completing 5 years June 2020  (4) adjuvant radiation completed 10/20/2013  (5) Trastuzumab resumed 09/07/2013, continued through 06/07/2014;  final echocardiogram on 03/30/2016showed a well preserved ejection fraction  (6) genetics testing April 2015 was normal and did not reveal a mutation in any of these genes: APC, ATM, AXIN2, BARD1, BMPRIA, BRCA1, BRCA2, BRIP1, CDH1, CDK4, CDKN2A, CHEK2, EPCAM, FANCC, MLH1, MSH2, MSH6, MUTYH, NBN, PALB2, PMS2, PTEN, RAD51C, SMAD4, STK11, TP53, VHL, and XRCC2  (7) osteopenia: DEXA scan 08/17/2013 showed a T score of -2.1--  (a) started Prolia (denosumab) 01/11/2014, repeated every 6 months, held after December 2019, resumed March 2021, last dose March 2022  (b) bone density 08/21/2015 showed a T score of -2.2 osteopenia   PLAN: Chene is now a little over 7 years out from definitive surgery for her breast cancer with no evidence of disease recurrence.  This is very  favorable.  She has received denosumab/Xgeva since 2015.  She will receive a final dose today but I think what ever benefit she was going to obtain from this medication she has obtained.  She has not had any dental issues and specifically no osteoosteonecrosis issues thankfully.  At this point I feel comfortable releasing her to her primary care physician.  All she will need in terms of breast cancer follow-up is her yearly mammogram and a yearly physician breast exam  I will be glad to see her again at any point in the future if and when the need arises but as of now are making no further routine appointments for her here.  Total encounter time 20 minutes.*  Calvin Chura, Virgie Dad, MD  05/09/20 11:14 AM Medical Oncology and Hematology [facility] [place], [place] [address] Tel. [phone]    Fax. [phone]   I, Wilburn Mylar, am acting as scribe for Dr. Virgie Dad. Jesselle Laflamme.  I, Lurline Del MD, have reviewed the above documentation for accuracy and completeness, and I agree with the above.   *Total Encounter Time as defined by the  Centers for Medicare and Medicaid Services includes, in addition to the face-to-face time of a patient visit (documented in the note above) non-face-to-face time: obtaining and reviewing outside history, ordering and reviewing medications, tests or procedures, care coordination (communications with other health care professionals or caregivers) and documentation in the medical record.

## 2020-05-09 ENCOUNTER — Inpatient Hospital Stay: Payer: Medicare Other | Admitting: Oncology

## 2020-05-09 ENCOUNTER — Other Ambulatory Visit: Payer: Self-pay

## 2020-05-09 ENCOUNTER — Inpatient Hospital Stay: Payer: Medicare Other | Attending: Oncology

## 2020-05-09 ENCOUNTER — Inpatient Hospital Stay: Payer: Medicare Other

## 2020-05-09 VITALS — BP 155/51 | HR 86 | Temp 97.7°F | Resp 17 | Ht 59.0 in | Wt 169.6 lb

## 2020-05-09 DIAGNOSIS — Z7901 Long term (current) use of anticoagulants: Secondary | ICD-10-CM | POA: Insufficient documentation

## 2020-05-09 DIAGNOSIS — M810 Age-related osteoporosis without current pathological fracture: Secondary | ICD-10-CM | POA: Diagnosis not present

## 2020-05-09 DIAGNOSIS — Z833 Family history of diabetes mellitus: Secondary | ICD-10-CM | POA: Diagnosis not present

## 2020-05-09 DIAGNOSIS — Z923 Personal history of irradiation: Secondary | ICD-10-CM | POA: Insufficient documentation

## 2020-05-09 DIAGNOSIS — E1122 Type 2 diabetes mellitus with diabetic chronic kidney disease: Secondary | ICD-10-CM | POA: Insufficient documentation

## 2020-05-09 DIAGNOSIS — Z853 Personal history of malignant neoplasm of breast: Secondary | ICD-10-CM | POA: Insufficient documentation

## 2020-05-09 DIAGNOSIS — Z8249 Family history of ischemic heart disease and other diseases of the circulatory system: Secondary | ICD-10-CM | POA: Insufficient documentation

## 2020-05-09 DIAGNOSIS — Z803 Family history of malignant neoplasm of breast: Secondary | ICD-10-CM | POA: Insufficient documentation

## 2020-05-09 DIAGNOSIS — Z8673 Personal history of transient ischemic attack (TIA), and cerebral infarction without residual deficits: Secondary | ICD-10-CM | POA: Insufficient documentation

## 2020-05-09 DIAGNOSIS — M8580 Other specified disorders of bone density and structure, unspecified site: Secondary | ICD-10-CM | POA: Insufficient documentation

## 2020-05-09 DIAGNOSIS — Z8041 Family history of malignant neoplasm of ovary: Secondary | ICD-10-CM | POA: Insufficient documentation

## 2020-05-09 DIAGNOSIS — I129 Hypertensive chronic kidney disease with stage 1 through stage 4 chronic kidney disease, or unspecified chronic kidney disease: Secondary | ICD-10-CM | POA: Diagnosis not present

## 2020-05-09 DIAGNOSIS — N183 Chronic kidney disease, stage 3 unspecified: Secondary | ICD-10-CM | POA: Diagnosis not present

## 2020-05-09 DIAGNOSIS — C50411 Malignant neoplasm of upper-outer quadrant of right female breast: Secondary | ICD-10-CM | POA: Diagnosis not present

## 2020-05-09 DIAGNOSIS — Z86711 Personal history of pulmonary embolism: Secondary | ICD-10-CM | POA: Insufficient documentation

## 2020-05-09 DIAGNOSIS — Z7984 Long term (current) use of oral hypoglycemic drugs: Secondary | ICD-10-CM | POA: Diagnosis not present

## 2020-05-09 DIAGNOSIS — Z9221 Personal history of antineoplastic chemotherapy: Secondary | ICD-10-CM | POA: Diagnosis not present

## 2020-05-09 DIAGNOSIS — Z79899 Other long term (current) drug therapy: Secondary | ICD-10-CM | POA: Insufficient documentation

## 2020-05-09 DIAGNOSIS — Z85828 Personal history of other malignant neoplasm of skin: Secondary | ICD-10-CM | POA: Diagnosis not present

## 2020-05-09 DIAGNOSIS — Z86718 Personal history of other venous thrombosis and embolism: Secondary | ICD-10-CM | POA: Insufficient documentation

## 2020-05-09 LAB — CBC WITH DIFFERENTIAL (CANCER CENTER ONLY)
Abs Immature Granulocytes: 0.01 10*3/uL (ref 0.00–0.07)
Basophils Absolute: 0 10*3/uL (ref 0.0–0.1)
Basophils Relative: 0 %
Eosinophils Absolute: 0.4 10*3/uL (ref 0.0–0.5)
Eosinophils Relative: 5 %
HCT: 31.7 % — ABNORMAL LOW (ref 36.0–46.0)
Hemoglobin: 10.3 g/dL — ABNORMAL LOW (ref 12.0–15.0)
Immature Granulocytes: 0 %
Lymphocytes Relative: 38 %
Lymphs Abs: 2.7 10*3/uL (ref 0.7–4.0)
MCH: 31.8 pg (ref 26.0–34.0)
MCHC: 32.5 g/dL (ref 30.0–36.0)
MCV: 97.8 fL (ref 80.0–100.0)
Monocytes Absolute: 0.4 10*3/uL (ref 0.1–1.0)
Monocytes Relative: 6 %
Neutro Abs: 3.6 10*3/uL (ref 1.7–7.7)
Neutrophils Relative %: 51 %
Platelet Count: 186 10*3/uL (ref 150–400)
RBC: 3.24 MIL/uL — ABNORMAL LOW (ref 3.87–5.11)
RDW: 14.3 % (ref 11.5–15.5)
WBC Count: 7 10*3/uL (ref 4.0–10.5)
nRBC: 0 % (ref 0.0–0.2)

## 2020-05-09 LAB — CMP (CANCER CENTER ONLY)
ALT: 12 U/L (ref 0–44)
AST: 13 U/L — ABNORMAL LOW (ref 15–41)
Albumin: 3.8 g/dL (ref 3.5–5.0)
Alkaline Phosphatase: 75 U/L (ref 38–126)
Anion gap: 13 (ref 5–15)
BUN: 107 mg/dL — ABNORMAL HIGH (ref 8–23)
CO2: 26 mmol/L (ref 22–32)
Calcium: 9.7 mg/dL (ref 8.9–10.3)
Chloride: 104 mmol/L (ref 98–111)
Creatinine: 2.82 mg/dL — ABNORMAL HIGH (ref 0.44–1.00)
GFR, Estimated: 15 mL/min — ABNORMAL LOW (ref >60)
Glucose, Bld: 236 mg/dL — ABNORMAL HIGH (ref 70–99)
Potassium: 4.1 mmol/L (ref 3.5–5.1)
Sodium: 143 mmol/L (ref 135–145)
Total Bilirubin: 0.5 mg/dL (ref 0.3–1.2)
Total Protein: 7.4 g/dL (ref 6.5–8.1)

## 2020-05-09 MED ORDER — DENOSUMAB 60 MG/ML ~~LOC~~ SOSY
60.0000 mg | PREFILLED_SYRINGE | Freq: Once | SUBCUTANEOUS | Status: AC
  Administered 2020-05-09: 60 mg via SUBCUTANEOUS

## 2020-05-09 MED ORDER — DENOSUMAB 60 MG/ML ~~LOC~~ SOSY
PREFILLED_SYRINGE | SUBCUTANEOUS | Status: AC
  Filled 2020-05-09: qty 1

## 2020-05-09 NOTE — Patient Instructions (Signed)
Denosumab injection What is this medicine? DENOSUMAB (den oh sue mab) slows bone breakdown. Prolia is used to treat osteoporosis in women after menopause and in men, and in people who are taking corticosteroids for 6 months or more. Xgeva is used to treat a high calcium level due to cancer and to prevent bone fractures and other bone problems caused by multiple myeloma or cancer bone metastases. Xgeva is also used to treat giant cell tumor of the bone. This medicine may be used for other purposes; ask your health care provider or pharmacist if you have questions. COMMON BRAND NAME(S): Prolia, XGEVA What should I tell my health care provider before I take this medicine? They need to know if you have any of these conditions:  dental disease  having surgery or tooth extraction  infection  kidney disease  low levels of calcium or Vitamin D in the blood  malnutrition  on hemodialysis  skin conditions or sensitivity  thyroid or parathyroid disease  an unusual reaction to denosumab, other medicines, foods, dyes, or preservatives  pregnant or trying to get pregnant  breast-feeding How should I use this medicine? This medicine is for injection under the skin. It is given by a health care professional in a hospital or clinic setting. A special MedGuide will be given to you before each treatment. Be sure to read this information carefully each time. For Prolia, talk to your pediatrician regarding the use of this medicine in children. Special care may be needed. For Xgeva, talk to your pediatrician regarding the use of this medicine in children. While this drug may be prescribed for children as young as 13 years for selected conditions, precautions do apply. Overdosage: If you think you have taken too much of this medicine contact a poison control center or emergency room at once. NOTE: This medicine is only for you. Do not share this medicine with others. What if I miss a dose? It is  important not to miss your dose. Call your doctor or health care professional if you are unable to keep an appointment. What may interact with this medicine? Do not take this medicine with any of the following medications:  other medicines containing denosumab This medicine may also interact with the following medications:  medicines that lower your chance of fighting infection  steroid medicines like prednisone or cortisone This list may not describe all possible interactions. Give your health care provider a list of all the medicines, herbs, non-prescription drugs, or dietary supplements you use. Also tell them if you smoke, drink alcohol, or use illegal drugs. Some items may interact with your medicine. What should I watch for while using this medicine? Visit your doctor or health care professional for regular checks on your progress. Your doctor or health care professional may order blood tests and other tests to see how you are doing. Call your doctor or health care professional for advice if you get a fever, chills or sore throat, or other symptoms of a cold or flu. Do not treat yourself. This drug may decrease your body's ability to fight infection. Try to avoid being around people who are sick. You should make sure you get enough calcium and vitamin D while you are taking this medicine, unless your doctor tells you not to. Discuss the foods you eat and the vitamins you take with your health care professional. See your dentist regularly. Brush and floss your teeth as directed. Before you have any dental work done, tell your dentist you are   receiving this medicine. Do not become pregnant while taking this medicine or for 5 months after stopping it. Talk with your doctor or health care professional about your birth control options while taking this medicine. Women should inform their doctor if they wish to become pregnant or think they might be pregnant. There is a potential for serious side  effects to an unborn child. Talk to your health care professional or pharmacist for more information. What side effects may I notice from receiving this medicine? Side effects that you should report to your doctor or health care professional as soon as possible:  allergic reactions like skin rash, itching or hives, swelling of the face, lips, or tongue  bone pain  breathing problems  dizziness  jaw pain, especially after dental work  redness, blistering, peeling of the skin  signs and symptoms of infection like fever or chills; cough; sore throat; pain or trouble passing urine  signs of low calcium like fast heartbeat, muscle cramps or muscle pain; pain, tingling, numbness in the hands or feet; seizures  unusual bleeding or bruising  unusually weak or tired Side effects that usually do not require medical attention (report to your doctor or health care professional if they continue or are bothersome):  constipation  diarrhea  headache  joint pain  loss of appetite  muscle pain  runny nose  tiredness  upset stomach This list may not describe all possible side effects. Call your doctor for medical advice about side effects. You may report side effects to FDA at 1-800-FDA-1088. Where should I keep my medicine? This medicine is only given in a clinic, doctor's office, or other health care setting and will not be stored at home. NOTE: This sheet is a summary. It may not cover all possible information. If you have questions about this medicine, talk to your doctor, pharmacist, or health care provider.  2021 Elsevier/Gold Standard (2017-06-27 16:10:44)

## 2020-05-10 ENCOUNTER — Telehealth: Payer: Self-pay | Admitting: Oncology

## 2020-05-10 LAB — CUP PACEART REMOTE DEVICE CHECK
Date Time Interrogation Session: 20220228234019
Implantable Pulse Generator Implant Date: 20190614

## 2020-05-10 NOTE — Telephone Encounter (Signed)
No 3/8 los. No changes made to pt's schedule.  

## 2020-05-15 DIAGNOSIS — I129 Hypertensive chronic kidney disease with stage 1 through stage 4 chronic kidney disease, or unspecified chronic kidney disease: Secondary | ICD-10-CM | POA: Diagnosis not present

## 2020-05-15 DIAGNOSIS — E1121 Type 2 diabetes mellitus with diabetic nephropathy: Secondary | ICD-10-CM | POA: Diagnosis not present

## 2020-05-15 DIAGNOSIS — Z0000 Encounter for general adult medical examination without abnormal findings: Secondary | ICD-10-CM | POA: Diagnosis not present

## 2020-05-15 DIAGNOSIS — E7800 Pure hypercholesterolemia, unspecified: Secondary | ICD-10-CM | POA: Diagnosis not present

## 2020-05-16 NOTE — Progress Notes (Signed)
[facility] Summary Report / Loop Recorder 

## 2020-05-18 ENCOUNTER — Encounter: Payer: Self-pay | Admitting: Sports Medicine

## 2020-05-18 ENCOUNTER — Ambulatory Visit (INDEPENDENT_AMBULATORY_CARE_PROVIDER_SITE_OTHER): Payer: Medicare Other | Admitting: Sports Medicine

## 2020-05-18 ENCOUNTER — Other Ambulatory Visit: Payer: Self-pay

## 2020-05-18 DIAGNOSIS — I739 Peripheral vascular disease, unspecified: Secondary | ICD-10-CM | POA: Diagnosis not present

## 2020-05-18 DIAGNOSIS — Z950 Presence of cardiac pacemaker: Secondary | ICD-10-CM | POA: Insufficient documentation

## 2020-05-18 DIAGNOSIS — D6859 Other primary thrombophilia: Secondary | ICD-10-CM | POA: Insufficient documentation

## 2020-05-18 DIAGNOSIS — I129 Hypertensive chronic kidney disease with stage 1 through stage 4 chronic kidney disease, or unspecified chronic kidney disease: Secondary | ICD-10-CM | POA: Insufficient documentation

## 2020-05-18 DIAGNOSIS — B351 Tinea unguium: Secondary | ICD-10-CM

## 2020-05-18 DIAGNOSIS — E119 Type 2 diabetes mellitus without complications: Secondary | ICD-10-CM | POA: Diagnosis not present

## 2020-05-18 DIAGNOSIS — Z6835 Body mass index (BMI) 35.0-35.9, adult: Secondary | ICD-10-CM | POA: Insufficient documentation

## 2020-05-18 DIAGNOSIS — R269 Unspecified abnormalities of gait and mobility: Secondary | ICD-10-CM | POA: Insufficient documentation

## 2020-05-18 DIAGNOSIS — M79675 Pain in left toe(s): Secondary | ICD-10-CM

## 2020-05-18 DIAGNOSIS — E1121 Type 2 diabetes mellitus with diabetic nephropathy: Secondary | ICD-10-CM | POA: Insufficient documentation

## 2020-05-18 DIAGNOSIS — D472 Monoclonal gammopathy: Secondary | ICD-10-CM | POA: Insufficient documentation

## 2020-05-18 DIAGNOSIS — M79674 Pain in right toe(s): Secondary | ICD-10-CM

## 2020-05-18 NOTE — Progress Notes (Signed)
Subjective: Sabrina [name] is a 87 y.o. female patient with history of diabetes who presents to office today complaining of long,mildly painful nails while in shoes; unable to trim. Patient denies any changes with medical history.  No other pedal complaints noted.   Patient is assisted by facility aide again this visit.   Patient Active Problem List   Diagnosis Date Noted  . Abnormal gait 05/18/2020  . Body mass index (BMI) 35.0-35.9, adult 05/18/2020  . Cardiac pacemaker in situ 05/18/2020  . Diabetic nephropathy ([place]) 05/18/2020  . IgM monoclonal gammopathy of uncertain significance 05/18/2020  . Malignant hypertensive chronic kidney disease 05/18/2020  . Morbid obesity ([place]) 05/18/2020  . Thrombophilia ([place]) 05/18/2020  . Secondary hypercoagulable state ([place]) 08/16/2019  . Osteoporosis 05/04/2019  . Hypercalcemia 11/04/2018  . History of stroke 11/04/2018  . Paroxysmal atrial fibrillation ([place]) 11/04/2018  . Acute kidney injury ([place])   . Acute metabolic encephalopathy 11/94/1740  . Personal history of DVT and pulm embolus 10/26/2018  . Personal history of other venous thrombosis and embolism 10/26/2018  . UTI (urinary tract infection) 10/24/2018  . Clavicle fracture 09/26/2018  . Degeneration of lumbar intervertebral disc 10/01/2017  . Blurry vision, bilateral   . Acute blood loss anemia   . History of breast cancer 05/30/2017  . Physical debility 05/30/2017  . Occipital infarction ([place]) 05/30/2017  . Pressure injury of skin 05/28/2017  . Fall at home 05/27/2017  . CKD (chronic kidney disease), stage IV ([place]) 05/27/2017  . High cholesterol 05/27/2017  . DM (diabetes mellitus) ([place]) 05/27/2017  . Hypertension 05/27/2017  . Gout 05/27/2017  . Elevated troponin 05/27/2017  . Lumbar radiculopathy 05/15/2017  . Pain of right calf 04/30/2017  . Asymmetrical left sensorineural hearing loss 02/14/2016  . Impacted cerumen of left ear 02/14/2016  . Lipoma of lower extremity  09/12/2014  . Abnormal x-ray 03/15/2014  . Chronic diastolic congestive heart failure ([place]) 02/23/2014  . Candidiasis of skin 01/13/2014  . Osteopenia 11/30/2013  . Hypoglycemia 09/13/2013  . Acute respiratory failure with hypoxia ([place]) 06/14/2013  . History of pulmonary embolism 06/10/2013  . Nausea and vomiting 06/10/2013  . Accelerated hypertension 06/10/2013  . Candidiasis of vagina 05/19/2013  . Aortic stenosis 04/22/2013  . Edema leg 04/22/2013  . Breast cancer of upper-outer quadrant of right female breast ([place]); in remission 03/19/2013   Current Outpatient Medications on File Prior to Visit  Medication Sig Dispense Refill  . albuterol (VENTOLIN HFA) 108 (90 Base) MCG/ACT inhaler SMARTSIG:2 Puff(s) By Mouth Every 6 Hours    . allopurinol (ZYLOPRIM) 100 MG tablet Take 100 mg by mouth daily.     [name] amLODipine (NORVASC) 2.5 MG tablet 1 tablet    . apixaban (ELIQUIS) 2.5 MG TABS tablet Take 1 tablet (2.5 mg total) by mouth 2 (two) times daily. 60 tablet 11  . cholecalciferol (VITAMIN D) 1000 UNITS tablet Take 1,000 Units by mouth daily.    . cloNIDine (CATAPRES) 0.2 MG tablet Take 0.2 mg by mouth 2 (two) times daily.    . colchicine 0.6 MG tablet Colcrys 0.6 mg tablet    . denosumab (PROLIA) 60 MG/ML SOSY injection Inject 60 mg into the skin every 6 (six) months.    . famotidine (PEPCID) 20 MG tablet Take 20 mg by mouth daily.    . furosemide (LASIX) 40 MG tablet Take 40 mg by mouth daily.    [name] gabapentin (NEURONTIN) 100 MG capsule Take 3 capsules (300 mg total) by mouth at bedtime.  Take 200 mg by mouth twice a day and take 300 mg by mouth at bedtime    . glipiZIDE (GLUCOTROL) 5 MG tablet Take 5 mg by mouth 2 (two) times daily before a meal.     . hydrALAZINE (APRESOLINE) 25 MG tablet Take 25 mg by mouth 3 (three) times daily.    . hydrocortisone cream 1 % Apply 1 application topically daily as needed for itching.    . nystatin ointment (MYCOSTATIN) nystatin 100,000 unit/gram topical  ointment    . nystatin-triamcinolone (MYCOLOG II) cream nystatin-triamcinolone 100,000 unit/g-0.1 % topical cream    . ofloxacin (OCUFLOX) 0.3 % ophthalmic solution ofloxacin 0.3 % eye drops    . ondansetron (ZOFRAN-ODT) 8 MG disintegrating tablet Take by mouth at bedtime.    . polyethylene glycol (MIRALAX / GLYCOLAX) packet Take 17 g by mouth daily.    [name] SSD 1 % cream     . traMADol (ULTRAM) 50 MG tablet Take 50 mg by mouth 2 (two) times daily as needed for moderate pain.     . TRUE METRIX BLOOD GLUCOSE TEST test strip     . TRUEplus Lancets 28G MISC Apply topically.    . valACYclovir (VALTREX) 500 MG tablet valacyclovir 500 mg tablet    . hydrALAZINE (APRESOLINE) 10 MG tablet Take 1 tablet (10 mg total) by mouth 3 (three) times daily. 90 tablet 2   No current facility-administered medications on file prior to visit.   Allergies  Allergen Reactions  . Beta Adrenergic Blockers     Objective: General: Patient is awake, alert, and oriented x 3 and in no acute distress.  Integument: Skin is warm, dry and supple bilateral. Nails are tender, long, thickened and dystrophic with subungual debris, consistent with onychomycosis, 1-5 bilateral. No signs of infection. No open lesions or preulcerative lesions present bilateral. Remaining integument unremarkable.  Vasculature:  Dorsalis Pedis pulse 1/4 bilateral. Posterior Tibial pulse  0/4 bilateral. Capillary fill time <5 sec 1-5 bilateral. Scant hair growth to the level of the digits.Temperature gradient within normal limits. No varicosities present bilateral.  1+ pitting edema present bilateral.   Neurology: Sensation present via light touch bilateral.   Musculoskeletal:Asymptomatic bunion and hammertoe pedal deformities noted bilateral. Muscular strength 5/5 in all lower extremity muscular groups bilateral without pain on range of motion. No tenderness with calf compression bilateral.   Assessment and Plan: Problem List Items Addressed This  Visit   None   Visit Diagnoses    Pain due to onychomycosis of toenails of both feet    -  Primary   PVD (peripheral vascular disease) ([place])       Relevant Medications   amLODipine (NORVASC) 2.5 MG tablet   Diabetes mellitus without complication ([place])         -Examined patient. -Re-Discussed and educated patient on diabetic foot care -Mechanically debrided all nails 1-5 bilateral using sterile nail nipper and filed with dremel without incident -Continue with elevation for edema control and medication management like before -Patient to return  in 3 months for at risk foot care -Patient advised to call the office if any problems or questions arise in the meantime.  Landis Martins, DPM

## 2020-05-23 DIAGNOSIS — H52203 Unspecified astigmatism, bilateral: Secondary | ICD-10-CM | POA: Diagnosis not present

## 2020-05-23 DIAGNOSIS — H5213 Myopia, bilateral: Secondary | ICD-10-CM | POA: Diagnosis not present

## 2020-05-23 DIAGNOSIS — H524 Presbyopia: Secondary | ICD-10-CM | POA: Diagnosis not present

## 2020-05-23 DIAGNOSIS — Z950 Presence of cardiac pacemaker: Secondary | ICD-10-CM | POA: Diagnosis not present

## 2020-05-23 DIAGNOSIS — E1121 Type 2 diabetes mellitus with diabetic nephropathy: Secondary | ICD-10-CM | POA: Diagnosis not present

## 2020-05-23 DIAGNOSIS — Z7984 Long term (current) use of oral hypoglycemic drugs: Secondary | ICD-10-CM | POA: Diagnosis not present

## 2020-05-23 DIAGNOSIS — I480 Paroxysmal atrial fibrillation: Secondary | ICD-10-CM | POA: Diagnosis not present

## 2020-05-23 DIAGNOSIS — R269 Unspecified abnormalities of gait and mobility: Secondary | ICD-10-CM | POA: Diagnosis not present

## 2020-05-23 DIAGNOSIS — I10 Essential (primary) hypertension: Secondary | ICD-10-CM | POA: Diagnosis not present

## 2020-05-23 DIAGNOSIS — E119 Type 2 diabetes mellitus without complications: Secondary | ICD-10-CM | POA: Diagnosis not present

## 2020-05-23 DIAGNOSIS — I129 Hypertensive chronic kidney disease with stage 1 through stage 4 chronic kidney disease, or unspecified chronic kidney disease: Secondary | ICD-10-CM | POA: Diagnosis not present

## 2020-05-23 DIAGNOSIS — M6281 Muscle weakness (generalized): Secondary | ICD-10-CM | POA: Diagnosis not present

## 2020-05-26 DIAGNOSIS — I129 Hypertensive chronic kidney disease with stage 1 through stage 4 chronic kidney disease, or unspecified chronic kidney disease: Secondary | ICD-10-CM | POA: Diagnosis not present

## 2020-05-26 DIAGNOSIS — I10 Essential (primary) hypertension: Secondary | ICD-10-CM | POA: Diagnosis not present

## 2020-05-26 DIAGNOSIS — I480 Paroxysmal atrial fibrillation: Secondary | ICD-10-CM | POA: Diagnosis not present

## 2020-05-26 DIAGNOSIS — M6281 Muscle weakness (generalized): Secondary | ICD-10-CM | POA: Diagnosis not present

## 2020-05-26 DIAGNOSIS — R269 Unspecified abnormalities of gait and mobility: Secondary | ICD-10-CM | POA: Diagnosis not present

## 2020-05-26 DIAGNOSIS — E1121 Type 2 diabetes mellitus with diabetic nephropathy: Secondary | ICD-10-CM | POA: Diagnosis not present

## 2020-05-26 DIAGNOSIS — Z950 Presence of cardiac pacemaker: Secondary | ICD-10-CM | POA: Diagnosis not present

## 2020-05-30 DIAGNOSIS — M6281 Muscle weakness (generalized): Secondary | ICD-10-CM | POA: Diagnosis not present

## 2020-05-30 DIAGNOSIS — R269 Unspecified abnormalities of gait and mobility: Secondary | ICD-10-CM | POA: Diagnosis not present

## 2020-05-30 DIAGNOSIS — I10 Essential (primary) hypertension: Secondary | ICD-10-CM | POA: Diagnosis not present

## 2020-05-30 DIAGNOSIS — Z950 Presence of cardiac pacemaker: Secondary | ICD-10-CM | POA: Diagnosis not present

## 2020-05-30 DIAGNOSIS — I480 Paroxysmal atrial fibrillation: Secondary | ICD-10-CM | POA: Diagnosis not present

## 2020-05-30 DIAGNOSIS — E1121 Type 2 diabetes mellitus with diabetic nephropathy: Secondary | ICD-10-CM | POA: Diagnosis not present

## 2020-05-30 DIAGNOSIS — I129 Hypertensive chronic kidney disease with stage 1 through stage 4 chronic kidney disease, or unspecified chronic kidney disease: Secondary | ICD-10-CM | POA: Diagnosis not present

## 2020-06-01 DIAGNOSIS — I480 Paroxysmal atrial fibrillation: Secondary | ICD-10-CM | POA: Diagnosis not present

## 2020-06-01 DIAGNOSIS — R269 Unspecified abnormalities of gait and mobility: Secondary | ICD-10-CM | POA: Diagnosis not present

## 2020-06-01 DIAGNOSIS — I10 Essential (primary) hypertension: Secondary | ICD-10-CM | POA: Diagnosis not present

## 2020-06-01 DIAGNOSIS — Z950 Presence of cardiac pacemaker: Secondary | ICD-10-CM | POA: Diagnosis not present

## 2020-06-01 DIAGNOSIS — M6281 Muscle weakness (generalized): Secondary | ICD-10-CM | POA: Diagnosis not present

## 2020-06-01 DIAGNOSIS — I129 Hypertensive chronic kidney disease with stage 1 through stage 4 chronic kidney disease, or unspecified chronic kidney disease: Secondary | ICD-10-CM | POA: Diagnosis not present

## 2020-06-01 DIAGNOSIS — E1121 Type 2 diabetes mellitus with diabetic nephropathy: Secondary | ICD-10-CM | POA: Diagnosis not present

## 2020-06-04 LAB — CUP PACEART REMOTE DEVICE CHECK
Date Time Interrogation Session: 20220403013017
Implantable Pulse Generator Implant Date: 20190614

## 2020-06-06 DIAGNOSIS — I10 Essential (primary) hypertension: Secondary | ICD-10-CM | POA: Diagnosis not present

## 2020-06-06 DIAGNOSIS — Z950 Presence of cardiac pacemaker: Secondary | ICD-10-CM | POA: Diagnosis not present

## 2020-06-06 DIAGNOSIS — R269 Unspecified abnormalities of gait and mobility: Secondary | ICD-10-CM | POA: Diagnosis not present

## 2020-06-06 DIAGNOSIS — I129 Hypertensive chronic kidney disease with stage 1 through stage 4 chronic kidney disease, or unspecified chronic kidney disease: Secondary | ICD-10-CM | POA: Diagnosis not present

## 2020-06-06 DIAGNOSIS — E1121 Type 2 diabetes mellitus with diabetic nephropathy: Secondary | ICD-10-CM | POA: Diagnosis not present

## 2020-06-06 DIAGNOSIS — M6281 Muscle weakness (generalized): Secondary | ICD-10-CM | POA: Diagnosis not present

## 2020-06-06 DIAGNOSIS — I480 Paroxysmal atrial fibrillation: Secondary | ICD-10-CM | POA: Diagnosis not present

## 2020-06-08 DIAGNOSIS — E1121 Type 2 diabetes mellitus with diabetic nephropathy: Secondary | ICD-10-CM | POA: Diagnosis not present

## 2020-06-08 DIAGNOSIS — M6281 Muscle weakness (generalized): Secondary | ICD-10-CM | POA: Diagnosis not present

## 2020-06-08 DIAGNOSIS — I480 Paroxysmal atrial fibrillation: Secondary | ICD-10-CM | POA: Diagnosis not present

## 2020-06-08 DIAGNOSIS — Z950 Presence of cardiac pacemaker: Secondary | ICD-10-CM | POA: Diagnosis not present

## 2020-06-08 DIAGNOSIS — I10 Essential (primary) hypertension: Secondary | ICD-10-CM | POA: Diagnosis not present

## 2020-06-08 DIAGNOSIS — I129 Hypertensive chronic kidney disease with stage 1 through stage 4 chronic kidney disease, or unspecified chronic kidney disease: Secondary | ICD-10-CM | POA: Diagnosis not present

## 2020-06-08 DIAGNOSIS — R269 Unspecified abnormalities of gait and mobility: Secondary | ICD-10-CM | POA: Diagnosis not present

## 2020-06-11 DIAGNOSIS — M6281 Muscle weakness (generalized): Secondary | ICD-10-CM | POA: Diagnosis not present

## 2020-06-11 DIAGNOSIS — Z950 Presence of cardiac pacemaker: Secondary | ICD-10-CM | POA: Diagnosis not present

## 2020-06-11 DIAGNOSIS — I129 Hypertensive chronic kidney disease with stage 1 through stage 4 chronic kidney disease, or unspecified chronic kidney disease: Secondary | ICD-10-CM | POA: Diagnosis not present

## 2020-06-11 DIAGNOSIS — E1121 Type 2 diabetes mellitus with diabetic nephropathy: Secondary | ICD-10-CM | POA: Diagnosis not present

## 2020-06-11 DIAGNOSIS — I10 Essential (primary) hypertension: Secondary | ICD-10-CM | POA: Diagnosis not present

## 2020-06-11 DIAGNOSIS — I480 Paroxysmal atrial fibrillation: Secondary | ICD-10-CM | POA: Diagnosis not present

## 2020-06-11 DIAGNOSIS — R269 Unspecified abnormalities of gait and mobility: Secondary | ICD-10-CM | POA: Diagnosis not present

## 2020-06-12 ENCOUNTER — Ambulatory Visit (INDEPENDENT_AMBULATORY_CARE_PROVIDER_SITE_OTHER): Payer: Medicare Other

## 2020-06-12 DIAGNOSIS — I639 Cerebral infarction, unspecified: Secondary | ICD-10-CM

## 2020-06-13 DIAGNOSIS — E1121 Type 2 diabetes mellitus with diabetic nephropathy: Secondary | ICD-10-CM | POA: Diagnosis not present

## 2020-06-13 DIAGNOSIS — I10 Essential (primary) hypertension: Secondary | ICD-10-CM | POA: Diagnosis not present

## 2020-06-13 DIAGNOSIS — Z950 Presence of cardiac pacemaker: Secondary | ICD-10-CM | POA: Diagnosis not present

## 2020-06-13 DIAGNOSIS — R269 Unspecified abnormalities of gait and mobility: Secondary | ICD-10-CM | POA: Diagnosis not present

## 2020-06-13 DIAGNOSIS — I129 Hypertensive chronic kidney disease with stage 1 through stage 4 chronic kidney disease, or unspecified chronic kidney disease: Secondary | ICD-10-CM | POA: Diagnosis not present

## 2020-06-13 DIAGNOSIS — M6281 Muscle weakness (generalized): Secondary | ICD-10-CM | POA: Diagnosis not present

## 2020-06-13 DIAGNOSIS — I480 Paroxysmal atrial fibrillation: Secondary | ICD-10-CM | POA: Diagnosis not present

## 2020-06-16 ENCOUNTER — Other Ambulatory Visit: Payer: Self-pay

## 2020-06-16 ENCOUNTER — Ambulatory Visit: Payer: Medicare Other | Attending: Internal Medicine

## 2020-06-16 DIAGNOSIS — Z23 Encounter for immunization: Secondary | ICD-10-CM

## 2020-06-16 NOTE — Progress Notes (Signed)
   Covid-19 Vaccination Clinic  Name:  Sabrina [name]    MRN: [id number] DOB: 1926-03-30  06/16/2020  [name] was observed post Covid-19 immunization for 15 minutes without incident. She was provided with Vaccine Information Sheet and instruction to access the V-Safe system.   [name] was instructed to call 911 with any severe reactions post vaccine: [name] Difficulty breathing  . Swelling of face and throat  . A fast heartbeat  . A bad rash all over body  . Dizziness and weakness   Immunizations Administered    Name Date Dose VIS Date Route   PFIZER Comrnaty(Gray TOP) Covid-19 Vaccine 06/16/2020 12:49 PM 0.3 mL 02/10/2020 Intramuscular   Manufacturer: [place]   Lot: [id number]   [place]: [phone]

## 2020-06-19 ENCOUNTER — Other Ambulatory Visit (HOSPITAL_BASED_OUTPATIENT_CLINIC_OR_DEPARTMENT_OTHER): Payer: Self-pay

## 2020-06-19 MED ORDER — COVID-19 MRNA VACCINE (PFIZER) 30 MCG/0.3ML IM SUSP
INTRAMUSCULAR | 0 refills | Status: DC
  Filled 2020-06-19: qty 0.3, 1d supply, fill #0

## 2020-06-20 DIAGNOSIS — R269 Unspecified abnormalities of gait and mobility: Secondary | ICD-10-CM | POA: Diagnosis not present

## 2020-06-20 DIAGNOSIS — I129 Hypertensive chronic kidney disease with stage 1 through stage 4 chronic kidney disease, or unspecified chronic kidney disease: Secondary | ICD-10-CM | POA: Diagnosis not present

## 2020-06-20 DIAGNOSIS — I10 Essential (primary) hypertension: Secondary | ICD-10-CM | POA: Diagnosis not present

## 2020-06-20 DIAGNOSIS — E1121 Type 2 diabetes mellitus with diabetic nephropathy: Secondary | ICD-10-CM | POA: Diagnosis not present

## 2020-06-20 DIAGNOSIS — Z950 Presence of cardiac pacemaker: Secondary | ICD-10-CM | POA: Diagnosis not present

## 2020-06-20 DIAGNOSIS — M6281 Muscle weakness (generalized): Secondary | ICD-10-CM | POA: Diagnosis not present

## 2020-06-20 DIAGNOSIS — I480 Paroxysmal atrial fibrillation: Secondary | ICD-10-CM | POA: Diagnosis not present

## 2020-06-23 DIAGNOSIS — I10 Essential (primary) hypertension: Secondary | ICD-10-CM | POA: Diagnosis not present

## 2020-06-23 DIAGNOSIS — I480 Paroxysmal atrial fibrillation: Secondary | ICD-10-CM | POA: Diagnosis not present

## 2020-06-23 DIAGNOSIS — Z950 Presence of cardiac pacemaker: Secondary | ICD-10-CM | POA: Diagnosis not present

## 2020-06-23 DIAGNOSIS — E1121 Type 2 diabetes mellitus with diabetic nephropathy: Secondary | ICD-10-CM | POA: Diagnosis not present

## 2020-06-23 DIAGNOSIS — R269 Unspecified abnormalities of gait and mobility: Secondary | ICD-10-CM | POA: Diagnosis not present

## 2020-06-23 DIAGNOSIS — I129 Hypertensive chronic kidney disease with stage 1 through stage 4 chronic kidney disease, or unspecified chronic kidney disease: Secondary | ICD-10-CM | POA: Diagnosis not present

## 2020-06-23 DIAGNOSIS — M6281 Muscle weakness (generalized): Secondary | ICD-10-CM | POA: Diagnosis not present

## 2020-06-26 NOTE — Progress Notes (Signed)
[facility] Summary Report / Loop Recorder 

## 2020-06-28 ENCOUNTER — Other Ambulatory Visit: Payer: Self-pay | Admitting: Geriatric Medicine

## 2020-06-28 ENCOUNTER — Ambulatory Visit
Admission: RE | Admit: 2020-06-28 | Discharge: 2020-06-28 | Disposition: A | Discharge status: Home / Self Care | Payer: Medicare Other | Source: Ambulatory Visit | Attending: Geriatric Medicine | Admitting: Geriatric Medicine

## 2020-06-28 DIAGNOSIS — R059 Cough, unspecified: Secondary | ICD-10-CM

## 2020-06-28 DIAGNOSIS — I700 Atherosclerosis of aorta: Secondary | ICD-10-CM | POA: Diagnosis not present

## 2020-06-28 DIAGNOSIS — R21 Rash and other nonspecific skin eruption: Secondary | ICD-10-CM | POA: Diagnosis not present

## 2020-06-28 DIAGNOSIS — E1121 Type 2 diabetes mellitus with diabetic nephropathy: Secondary | ICD-10-CM | POA: Diagnosis not present

## 2020-06-28 DIAGNOSIS — I480 Paroxysmal atrial fibrillation: Secondary | ICD-10-CM | POA: Diagnosis not present

## 2020-06-30 DIAGNOSIS — I10 Essential (primary) hypertension: Secondary | ICD-10-CM | POA: Diagnosis not present

## 2020-06-30 DIAGNOSIS — R269 Unspecified abnormalities of gait and mobility: Secondary | ICD-10-CM | POA: Diagnosis not present

## 2020-06-30 DIAGNOSIS — M6281 Muscle weakness (generalized): Secondary | ICD-10-CM | POA: Diagnosis not present

## 2020-06-30 DIAGNOSIS — Z950 Presence of cardiac pacemaker: Secondary | ICD-10-CM | POA: Diagnosis not present

## 2020-06-30 DIAGNOSIS — E1121 Type 2 diabetes mellitus with diabetic nephropathy: Secondary | ICD-10-CM | POA: Diagnosis not present

## 2020-06-30 DIAGNOSIS — I129 Hypertensive chronic kidney disease with stage 1 through stage 4 chronic kidney disease, or unspecified chronic kidney disease: Secondary | ICD-10-CM | POA: Diagnosis not present

## 2020-06-30 DIAGNOSIS — I480 Paroxysmal atrial fibrillation: Secondary | ICD-10-CM | POA: Diagnosis not present

## 2020-07-04 ENCOUNTER — Other Ambulatory Visit (HOSPITAL_COMMUNITY): Payer: Self-pay | Admitting: Physician Assistant

## 2020-07-17 ENCOUNTER — Ambulatory Visit (INDEPENDENT_AMBULATORY_CARE_PROVIDER_SITE_OTHER): Payer: Medicare Other

## 2020-07-17 DIAGNOSIS — I639 Cerebral infarction, unspecified: Secondary | ICD-10-CM | POA: Diagnosis not present

## 2020-07-18 LAB — CUP PACEART REMOTE DEVICE CHECK
Date Time Interrogation Session: 20220514231249
Implantable Pulse Generator Implant Date: 20190614

## 2020-08-01 ENCOUNTER — Encounter (HOSPITAL_COMMUNITY): Payer: Self-pay

## 2020-08-01 ENCOUNTER — Emergency Department (HOSPITAL_COMMUNITY): Payer: Medicare Other

## 2020-08-01 ENCOUNTER — Emergency Department (HOSPITAL_COMMUNITY)
Admission: EM | Admit: 2020-08-01 | Discharge: 2020-08-01 | Disposition: A | Discharge status: Home / Self Care | Payer: Medicare Other | Attending: Emergency Medicine | Admitting: Emergency Medicine

## 2020-08-01 ENCOUNTER — Other Ambulatory Visit: Payer: Self-pay

## 2020-08-01 DIAGNOSIS — S39012A Strain of muscle, fascia and tendon of lower back, initial encounter: Secondary | ICD-10-CM

## 2020-08-01 DIAGNOSIS — M7989 Other specified soft tissue disorders: Secondary | ICD-10-CM | POA: Diagnosis not present

## 2020-08-01 DIAGNOSIS — S199XXA Unspecified injury of neck, initial encounter: Secondary | ICD-10-CM | POA: Diagnosis not present

## 2020-08-01 DIAGNOSIS — W01198A Fall on same level from slipping, tripping and stumbling with subsequent striking against other object, initial encounter: Secondary | ICD-10-CM | POA: Insufficient documentation

## 2020-08-01 DIAGNOSIS — S0990XA Unspecified injury of head, initial encounter: Secondary | ICD-10-CM

## 2020-08-01 DIAGNOSIS — I130 Hypertensive heart and chronic kidney disease with heart failure and stage 1 through stage 4 chronic kidney disease, or unspecified chronic kidney disease: Secondary | ICD-10-CM | POA: Insufficient documentation

## 2020-08-01 DIAGNOSIS — Z79899 Other long term (current) drug therapy: Secondary | ICD-10-CM | POA: Diagnosis not present

## 2020-08-01 DIAGNOSIS — Z85828 Personal history of other malignant neoplasm of skin: Secondary | ICD-10-CM | POA: Insufficient documentation

## 2020-08-01 DIAGNOSIS — I5032 Chronic diastolic (congestive) heart failure: Secondary | ICD-10-CM | POA: Insufficient documentation

## 2020-08-01 DIAGNOSIS — Z7984 Long term (current) use of oral hypoglycemic drugs: Secondary | ICD-10-CM | POA: Insufficient documentation

## 2020-08-01 DIAGNOSIS — M25521 Pain in right elbow: Secondary | ICD-10-CM | POA: Diagnosis not present

## 2020-08-01 DIAGNOSIS — S5001XA Contusion of right elbow, initial encounter: Secondary | ICD-10-CM | POA: Diagnosis not present

## 2020-08-01 DIAGNOSIS — I10 Essential (primary) hypertension: Secondary | ICD-10-CM | POA: Diagnosis not present

## 2020-08-01 DIAGNOSIS — E1121 Type 2 diabetes mellitus with diabetic nephropathy: Secondary | ICD-10-CM | POA: Insufficient documentation

## 2020-08-01 DIAGNOSIS — M546 Pain in thoracic spine: Secondary | ICD-10-CM | POA: Diagnosis not present

## 2020-08-01 DIAGNOSIS — M4692 Unspecified inflammatory spondylopathy, cervical region: Secondary | ICD-10-CM | POA: Diagnosis not present

## 2020-08-01 DIAGNOSIS — M4312 Spondylolisthesis, cervical region: Secondary | ICD-10-CM | POA: Diagnosis not present

## 2020-08-01 DIAGNOSIS — M47812 Spondylosis without myelopathy or radiculopathy, cervical region: Secondary | ICD-10-CM | POA: Diagnosis not present

## 2020-08-01 DIAGNOSIS — N184 Chronic kidney disease, stage 4 (severe): Secondary | ICD-10-CM | POA: Insufficient documentation

## 2020-08-01 DIAGNOSIS — Z7901 Long term (current) use of anticoagulants: Secondary | ICD-10-CM | POA: Diagnosis not present

## 2020-08-01 DIAGNOSIS — W19XXXA Unspecified fall, initial encounter: Secondary | ICD-10-CM

## 2020-08-01 DIAGNOSIS — S3992XA Unspecified injury of lower back, initial encounter: Secondary | ICD-10-CM | POA: Diagnosis present

## 2020-08-01 DIAGNOSIS — M545 Low back pain, unspecified: Secondary | ICD-10-CM | POA: Diagnosis not present

## 2020-08-01 DIAGNOSIS — Z853 Personal history of malignant neoplasm of breast: Secondary | ICD-10-CM | POA: Insufficient documentation

## 2020-08-01 DIAGNOSIS — I4891 Unspecified atrial fibrillation: Secondary | ICD-10-CM | POA: Diagnosis not present

## 2020-08-01 DIAGNOSIS — I959 Hypotension, unspecified: Secondary | ICD-10-CM | POA: Diagnosis not present

## 2020-08-01 DIAGNOSIS — M549 Dorsalgia, unspecified: Secondary | ICD-10-CM | POA: Diagnosis not present

## 2020-08-01 LAB — CBC WITH DIFFERENTIAL/PLATELET
Abs Immature Granulocytes: 0.02 10*3/uL (ref 0.00–0.07)
Basophils Absolute: 0 10*3/uL (ref 0.0–0.1)
Basophils Relative: 0 %
Eosinophils Absolute: 0.3 10*3/uL (ref 0.0–0.5)
Eosinophils Relative: 4 %
HCT: 31.3 % — ABNORMAL LOW (ref 36.0–46.0)
Hemoglobin: 9.9 g/dL — ABNORMAL LOW (ref 12.0–15.0)
Immature Granulocytes: 0 %
Lymphocytes Relative: 41 %
Lymphs Abs: 3.2 10*3/uL (ref 0.7–4.0)
MCH: 31.8 pg (ref 26.0–34.0)
MCHC: 31.6 g/dL (ref 30.0–36.0)
MCV: 100.6 fL — ABNORMAL HIGH (ref 80.0–100.0)
Monocytes Absolute: 0.5 10*3/uL (ref 0.1–1.0)
Monocytes Relative: 7 %
Neutro Abs: 3.7 10*3/uL (ref 1.7–7.7)
Neutrophils Relative %: 48 %
Platelets: 189 10*3/uL (ref 150–400)
RBC: 3.11 MIL/uL — ABNORMAL LOW (ref 3.87–5.11)
RDW: 14.3 % (ref 11.5–15.5)
WBC: 7.7 10*3/uL (ref 4.0–10.5)
nRBC: 0 % (ref 0.0–0.2)

## 2020-08-01 LAB — BASIC METABOLIC PANEL
Anion gap: 10 (ref 5–15)
BUN: 101 mg/dL — ABNORMAL HIGH (ref 8–23)
CO2: 26 mmol/L (ref 22–32)
Calcium: 9 mg/dL (ref 8.9–10.3)
Chloride: 104 mmol/L (ref 98–111)
Creatinine, Ser: 2.52 mg/dL — ABNORMAL HIGH (ref 0.44–1.00)
GFR, Estimated: 18 mL/min — ABNORMAL LOW (ref >60)
Glucose, Bld: 154 mg/dL — ABNORMAL HIGH (ref 70–99)
Potassium: 3.9 mmol/L (ref 3.5–5.1)
Sodium: 140 mmol/L (ref 135–145)

## 2020-08-01 MED ORDER — ACETAMINOPHEN 500 MG PO TABS: 1000.0000 mg | ORAL_TABLET | Freq: Once | ORAL | Status: DC

## 2020-08-01 NOTE — Discharge Instructions (Addendum)
Use ice and Tylenol as needed for pain. Return for weakness, numbness, fevers or new concerns.

## 2020-08-01 NOTE — ED Notes (Signed)
Swabbed pt's mouth as she was complained of having a dry mouth.

## 2020-08-01 NOTE — ED Notes (Signed)
Patient verbalizes understanding of discharge instructions. Opportunity for questioning and answers were provided. Pt discharged from ED. 

## 2020-08-01 NOTE — ED Notes (Signed)
Pt given PO fluids per MD order

## 2020-08-01 NOTE — Progress Notes (Signed)
Responded to page Pt. Fall. Per pt's  Nurse Patient step on scale and stepped off and loss balance. Per Nurse patient is OK.  Will follow as needed.  Jaclynn Major, [place], [facility], Pager [phone]

## 2020-08-01 NOTE — ED Provider Notes (Signed)
[facility] EMERGENCY DEPARTMENT Provider Note   CSN: [id number] Arrival date & time: 08/01/20  1038     History Chief Complaint  Patient presents with  . [facility] Sabrina [name] is a 87 y.o. female.  Patient with history of DVT, A. fib on Eliquis, chronic kidney disease, diabetes type 2, pulmonary embolism presents after mechanical fall off of the scale this morning.  Patient lost balance and fell back hitting her buttocks and mildly hitting her head and right elbow.  Patient denies other significant injuries.  Patient has mild back discomfort difficult for her to localize.  Patient did bite her tongue bleeding controlled.  Patient denies fever or infectious symptoms recently, no syncope, no general weakness.        Past Medical History:  Diagnosis Date  . Arthritis    "mostly in my hands, lower back" (06/25/2017)  . Basal cell carcinoma (BCC) of face 1983  . Breast cancer, right breast ([place]) 03/2013  . Chronic kidney disease (CKD), stage III (moderate) (HCC)    nephrologist, Dr. [name]  . Dental crowns present   . DVT (deep venous thrombosis) ([place]) ~ 06/2013   "? side"  . Gout    "on daily RX" (06/25/2017)  . Heart murmur    no known problems; states did not know she had murmur until age 59  . High cholesterol   . Hypertension    fluctuates, especially when stressed; has been on med. > 20 yr.  . Immature cataract   . Non-insulin dependent type 2 diabetes mellitus ([place])   . Personal history of chemotherapy   . Personal history of radiation therapy   . Pulmonary embolism ([place]) ~ 06/2013  . Radiation 09/06/13-10/20/13   Right Breast Cancer  . Stroke ([place]) 05/2017   just visual problems since (06/25/2017)  . Wears partial dentures    lower    Patient Active Problem List   Diagnosis Date Noted  . Abnormal gait 05/18/2020  . Body mass index (BMI) 35.0-35.9, adult 05/18/2020  . Cardiac pacemaker in situ 05/18/2020  . Diabetic nephropathy  ([place]) 05/18/2020  . IgM monoclonal gammopathy of uncertain significance 05/18/2020  . Malignant hypertensive chronic kidney disease 05/18/2020  . Morbid obesity ([place]) 05/18/2020  . Thrombophilia ([place]) 05/18/2020  . Secondary hypercoagulable state ([place]) 08/16/2019  . Osteoporosis 05/04/2019  . Hypercalcemia 11/04/2018  . History of stroke 11/04/2018  . Paroxysmal atrial fibrillation ([place]) 11/04/2018  . Acute kidney injury ([place])   . Acute metabolic encephalopathy 46/96/2952  . Personal history of DVT and pulm embolus 10/26/2018  . Personal history of other venous thrombosis and embolism 10/26/2018  . UTI (urinary tract infection) 10/24/2018  . Clavicle fracture 09/26/2018  . Degeneration of lumbar intervertebral disc 10/01/2017  . Blurry vision, bilateral   . Acute blood loss anemia   . History of breast cancer 05/30/2017  . Physical debility 05/30/2017  . Occipital infarction ([place]) 05/30/2017  . Pressure injury of skin 05/28/2017  . Fall at home 05/27/2017  . CKD (chronic kidney disease), stage IV ([place]) 05/27/2017  . High cholesterol 05/27/2017  . DM (diabetes mellitus) ([place]) 05/27/2017  . Hypertension 05/27/2017  . Gout 05/27/2017  . Elevated troponin 05/27/2017  . Lumbar radiculopathy 05/15/2017  . Pain of right calf 04/30/2017  . Asymmetrical left sensorineural hearing loss 02/14/2016  . Impacted cerumen of left ear 02/14/2016  . Lipoma of lower extremity 09/12/2014  . Abnormal x-ray 03/15/2014  . Chronic diastolic congestive  heart failure ([place]) 02/23/2014  . Candidiasis of skin 01/13/2014  . Osteopenia 11/30/2013  . Hypoglycemia 09/13/2013  . Acute respiratory failure with hypoxia ([place]) 06/14/2013  . History of pulmonary embolism 06/10/2013  . Nausea and vomiting 06/10/2013  . Accelerated hypertension 06/10/2013  . Candidiasis of vagina 05/19/2013  . Aortic stenosis 04/22/2013  . Edema leg 04/22/2013  . Breast cancer of upper-outer quadrant of right female breast  ([place]); in remission 03/19/2013    Past Surgical History:  Procedure Laterality Date  . AXILLARY LYMPH NODE DISSECTION Right 03/30/2013   Procedure: AXILLARY LYMPH NODE DISSECTION;  Surgeon: Rolm Bookbinder, MD;  Location: [facility];  Service: General;  Laterality: Right;  . BASAL CELL CARCINOMA EXCISION  1983   "face"  . BREAST BIOPSY Right 03/17/2013  . BREAST CYST EXCISION Right 11/1958   benign  . BREAST LUMPECTOMY Right 03/30/2013  . BREAST LUMPECTOMY WITH NEEDLE LOCALIZATION Right 03/30/2013   Procedure: BREAST LUMPECTOMY WITH NEEDLE LOCALIZATION;  Surgeon: Rolm Bookbinder, MD;  Location: [facility];  Service: General;  Laterality: Right;  . DILATION AND CURETTAGE OF UTERUS    . LOOP RECORDER INSERTION N/A 08/15/2017   Procedure: LOOP RECORDER INSERTION;  Surgeon: Evans Lance, MD;  Location: [facility];  Service: Cardiovascular;  Laterality: N/A;  . PORT-A-CATH REMOVAL  2016  . PORTACATH PLACEMENT N/A 04/15/2013   Procedure: INSERTION PORT-A-CATH;  Surgeon: Rolm Bookbinder, MD;  Location: [facility];  Service: General;  Laterality: N/A;  . RE-EXCISION OF BREAST CANCER,SUPERIOR MARGINS Right 04/15/2013   Procedure: RE-EXCISION OF RIGHT BREAST  MARGINS;  Surgeon: Rolm Bookbinder, MD;  Location: [facility];  Service: General;  Laterality: Right;  . TONSILLECTOMY  ~ 1935/1936     OB History   No obstetric history on file.    Obstetric Comments  Menarche ae 14 No children Married 67 years        Family History  Problem Relation Age of Onset  . Pneumonia Mother   . Heart attack Father   . Breast cancer Other 48       niece  . Breast cancer Other 37       niece  . Ovarian cancer Other        niece    Social History   Tobacco Use  . Smoking status: Never Smoker  . Smokeless tobacco: Never Used  . Tobacco comment: only smoked 2 packs cigarettes total; husband quit in [place] Use  . Vaping Use: Never used  Substance Use  Topics  . Alcohol use: Not Currently    Alcohol/week: 3.0 standard drinks    Types: 3 Glasses of wine per week  . Drug use: No    Home Medications Prior to Admission medications   Medication Sig Start Date End Date Taking? Authorizing Provider  albuterol (VENTOLIN HFA) 108 (90 Base) MCG/ACT inhaler Inhale 1-2 puffs into the lungs every 6 (six) hours as needed for wheezing or shortness of breath. 03/22/19  Yes [provider]  allopurinol (ZYLOPRIM) 100 MG tablet Take 100 mg by mouth daily.  01/17/16  Yes [provider]  amLODipine (NORVASC) 2.5 MG tablet Take 2.5 mg by mouth daily. 04/11/20  Yes [provider]  cholecalciferol (VITAMIN D) 1000 UNITS tablet Take 1,000 Units by mouth daily.   Yes [provider]  cloNIDine (CATAPRES) 0.2 MG tablet Take 0.2 mg by mouth 2 (two) times daily.   Yes [provider]  denosumab (PROLIA) 60  MG/ML SOSY injection Inject 60 mg into the skin every 6 (six) months.   Yes [provider]  ELIQUIS 2.5 MG TABS tablet TAKE 1 TABLET BY MOUTH TWICE DAILY. Patient taking differently: Take 2.5 mg by mouth 2 (two) times daily. 07/04/20  Yes Fenton, Clint R, PA  famotidine (PEPCID) 20 MG tablet Take 20 mg by mouth daily. 11/09/19  Yes [provider]  furosemide (LASIX) 40 MG tablet Take 40 mg by mouth daily.   Yes [provider]  gabapentin (NEURONTIN) 100 MG capsule Take 3 capsules (300 mg total) by mouth at bedtime. Take 200 mg by mouth twice a day and take 300 mg by mouth at bedtime 11/06/18  Yes Mariel Aloe, MD  glipiZIDE (GLUCOTROL) 5 MG tablet Take 5 mg by mouth 2 (two) times daily before a meal.    Yes [provider]  hydrALAZINE (APRESOLINE) 50 MG tablet Take 50 mg by mouth 3 (three) times daily.   Yes [provider]  hydrocortisone cream 1 % Apply 1 application topically daily as needed for itching.   Yes [provider]  ofloxacin (OCUFLOX) 0.3 % ophthalmic  solution Place 1 drop into both eyes daily.   Yes [provider]  polyethylene glycol (MIRALAX / GLYCOLAX) packet Take 17 g by mouth daily.   Yes [provider]  traMADol (ULTRAM) 50 MG tablet Take 50 mg by mouth 2 (two) times daily as needed for moderate pain.    Yes [provider]  COVID-19 mRNA vaccine, Pfizer, 30 MCG/0.3ML injection Inject into the muscle. 06/16/20   Carlyle Basques, MD  hydrALAZINE (APRESOLINE) 10 MG tablet Take 1 tablet (10 mg total) by mouth 3 (three) times daily. 09/29/18 12/28/18  Hosie Poisson, MD  TRUE METRIX BLOOD GLUCOSE TEST test strip  07/19/19   [provider]  TRUEplus Lancets 28G MISC Apply topically. 08/20/19   [provider]    Allergies    Beta adrenergic blockers  Review of Systems   Review of Systems  Constitutional: Negative for chills and fever.  HENT: Negative for congestion.   Eyes: Negative for visual disturbance.  Respiratory: Negative for shortness of breath.   Cardiovascular: Negative for chest pain.  Gastrointestinal: Negative for abdominal pain and vomiting.  Genitourinary: Negative for dysuria and flank pain.  Musculoskeletal: Positive for back pain and joint swelling. Negative for neck pain and neck stiffness.  Skin: Positive for wound. Negative for rash.  Neurological: Negative for light-headedness and headaches.    Physical Exam Updated Vital Signs BP (!) 159/59   Pulse 81   Temp 98.4 F (36.9 C) (Oral)   Resp 16   Ht 4\' 10"  (1.473 m)   Wt 77.1 kg   SpO2 95%   BMI 35.53 kg/m   Physical Exam Vitals and nursing note reviewed.  Constitutional:      Appearance: She is well-developed.  HENT:     Head: Normocephalic.  Eyes:     General:        Right eye: No discharge.        Left eye: No discharge.     Conjunctiva/sclera: Conjunctivae normal.  Neck:     Trachea: No tracheal deviation.  Cardiovascular:     Rate and Rhythm: Normal rate and regular rhythm.  Pulmonary:      Effort: Pulmonary effort is normal.     Breath sounds: Normal breath sounds.  Abdominal:     General: There is no distension.  Palpations: Abdomen is soft.     Tenderness: There is no abdominal tenderness. There is no guarding.  Musculoskeletal:        General: Tenderness present. No swelling.     Cervical back: Normal range of motion and neck supple.     Comments: Patient has no focal midline bony tenderness however says there is discomfort somewhere in her back, no step-off.  No focal tenderness to lower extremities bilateral.  Patient has mild ecchymosis and tenderness to right elbow, no other tenderness.  Full range of motion head neck without significant discomfort.  Skin:    General: Skin is warm.     Findings: No rash.  Neurological:     General: No focal deficit present.     Mental Status: She is alert and oriented to person, place, and time.     Cranial Nerves: No cranial nerve deficit.  Psychiatric:        Mood and Affect: Mood normal.     ED Results / Procedures / Treatments   Labs (all labs ordered are listed, but only abnormal results are displayed) Labs Reviewed  BASIC METABOLIC PANEL - Abnormal; Notable for the following components:      Result Value   Glucose, Bld 154 (*)    BUN 101 (*)    Creatinine, Ser 2.52 (*)    GFR, Estimated 18 (*)    All other components within normal limits  CBC WITH DIFFERENTIAL/PLATELET - Abnormal; Notable for the following components:   RBC 3.11 (*)    Hemoglobin 9.9 (*)    HCT 31.3 (*)    MCV 100.6 (*)    All other components within normal limits    EKG EKG Interpretation  Date/Time:  Tuesday Aug 01 2020 10:39:21 EDT Ventricular Rate:  73 PR Interval:  184 QRS Duration: 93 QT Interval:  402 QTC Calculation: 443 R Axis:   79 Text Interpretation: Sinus rhythm Atrial premature complex Confirmed by Elnora Morrison [phone]) on 08/01/2020 11:57:41 AM   Radiology DG Thoracic Spine 2 View  Result Date: 08/01/2020 CLINICAL  DATA:  Back pain after fall EXAM: THORACIC SPINE 2 VIEWS COMPARISON:  10/24/2018 FINDINGS: There is no evidence of thoracic spine fracture. Vertebral body heights are maintained. Alignment is normal. Intervertebral disc heights are relatively well preserved throughout the thoracic spine. No other significant bone abnormalities are identified. IMPRESSION: Negative. Electronically Signed   By: Davina Poke D.O.   On: 08/01/2020 13:04   DG Lumbar Spine 2-3 Views  Result Date: 08/01/2020 CLINICAL DATA:  Fall.  Pain. EXAM: LUMBAR SPINE - 2-3 VIEW COMPARISON:  CT 10/24/2018. FINDINGS: Gallstones again noted. Large amount of stool noted throughout the colon. Constipation could present this fashion. No bowel distention or free air. Aortoiliac and visceral atherosclerotic vascular calcification. Diffuse multilevel severe degenerative changes lumbar spine. No acute bony abnormality identified. Degenerative changes both hips. IMPRESSION: 1.  Gallstones again noted. 2. Large amount of stool noted throughout the colon. Constipation could present this fashion. No bowel distention or free air. 3.  Aortoiliac and visceral atherosclerotic vascular disease. 4. Diffuse severe multilevel degenerative changes lumbar spine. Degenerative changes both hips. No acute bony abnormality is identified. Electronically Signed   By: Marcello Moores  Register   On: 08/01/2020 13:02   DG Elbow Complete Right  Result Date: 08/01/2020 CLINICAL DATA:  Fall.  Pain. EXAM: RIGHT ELBOW - COMPLETE 3+ VIEW COMPARISON:  No recent. FINDINGS: Soft tissue swelling may be present. No prominent effusion. Miniscule bony densities noted posterior  to the olecranon. These could represent tiny chip fractures. Lucency noted about the ulnar epicondyle of the humerus. Although a fracture cannot be excluded, this may represent incompletely unified secondary ossification center. IMPRESSION: Soft tissue swelling present. Miniscule bony densities noted posterior to the  olecranon. These could be tiny chip fractures. Lucency noted about the ulnar epicondyle of the humerus. Although a fracture cannot be excluded, this may represent incompletely unified secondary ossification center. Electronically Signed   By: Marcello Moores  Register   On: 08/01/2020 13:00   CT Head Wo Contrast  Result Date: 08/01/2020 CLINICAL DATA:  Fall, loss of balance with trauma to head. Patient on anticoagulation by report. EXAM: CT HEAD WITHOUT CONTRAST CT CERVICAL SPINE WITHOUT CONTRAST TECHNIQUE: Multidetector CT imaging of the head and cervical spine was performed following the standard protocol without intravenous contrast. Multiplanar CT image reconstructions of the cervical spine were also generated. COMPARISON:  November 05, 2018 FINDINGS: CT HEAD FINDINGS Brain: No evidence of acute infarction, hemorrhage, hydrocephalus, extra-axial collection or mass lesion/mass effect. Bilateral occipital low attenuation compatible with areas of prior infarct, atrophy and chronic microvascular ischemic changes without change since previous imaging. Vascular: No hyperdense vessel or unexpected calcification. Skull: Normal. Negative for fracture or focal lesion. Sinuses/Orbits: Visualized paranasal sinuses and orbits are unremarkable. Other: None CT CERVICAL SPINE FINDINGS Alignment: Mild anterolisthesis of C4 on C5 is similar to the prior study, approximately 2-3 mm anterolisthesis as compared to the study of March of 2019. Associated facet arthropathy and multilevel degenerative changes may account for this finding. Skull base and vertebrae: No acute fracture. No primary bone lesion or focal pathologic process. Soft tissues and spinal canal: No prevertebral fluid or swelling. No visible canal hematoma. Disc levels: Multilevel degenerative changes with moderate to marked facet arthropathy throughout the cervical spine grossly similar to the prior study. Facet arthropathy greatest in the upper cervical spine. Disc space  narrowing greatest in the mid and lower cervical spine. Upper chest: Stable ground-glass nodularity in the RIGHT upper chest compared to studies dating back to 2017 and to the prior CT of the cervical spine may relate to prior radiotherapy as suggested on previous imaging. Other: None IMPRESSION: 1. No acute intracranial abnormality. 2. Bilateral occipital low attenuation compatible with areas of prior infarct, atrophy and chronic microvascular ischemic changes without change since previous imaging. 3. No evidence for acute fracture or static subluxation of the cervical spine. 4. Multilevel degenerative changes and facet arthropathy of the cervical spine grossly similar to the prior study. 5. Stable ground-glass nodularity in the RIGHT upper chest compared to studies dating back to 2017 and to the prior CT of the cervical spine may relate to prior radiotherapy as suggested on previous imaging. Electronically Signed   By: Zetta Bills M.D.   On: 08/01/2020 11:45   CT Cervical Spine Wo Contrast  Result Date: 08/01/2020 CLINICAL DATA:  Fall, loss of balance with trauma to head. Patient on anticoagulation by report. EXAM: CT HEAD WITHOUT CONTRAST CT CERVICAL SPINE WITHOUT CONTRAST TECHNIQUE: Multidetector CT imaging of the head and cervical spine was performed following the standard protocol without intravenous contrast. Multiplanar CT image reconstructions of the cervical spine were also generated. COMPARISON:  November 05, 2018 FINDINGS: CT HEAD FINDINGS Brain: No evidence of acute infarction, hemorrhage, hydrocephalus, extra-axial collection or mass lesion/mass effect. Bilateral occipital low attenuation compatible with areas of prior infarct, atrophy and chronic microvascular ischemic changes without change since previous imaging. Vascular: No hyperdense vessel or unexpected calcification. Skull: Normal.  Negative for fracture or focal lesion. Sinuses/Orbits: Visualized paranasal sinuses and orbits are  unremarkable. Other: None CT CERVICAL SPINE FINDINGS Alignment: Mild anterolisthesis of C4 on C5 is similar to the prior study, approximately 2-3 mm anterolisthesis as compared to the study of March of 2019. Associated facet arthropathy and multilevel degenerative changes may account for this finding. Skull base and vertebrae: No acute fracture. No primary bone lesion or focal pathologic process. Soft tissues and spinal canal: No prevertebral fluid or swelling. No visible canal hematoma. Disc levels: Multilevel degenerative changes with moderate to marked facet arthropathy throughout the cervical spine grossly similar to the prior study. Facet arthropathy greatest in the upper cervical spine. Disc space narrowing greatest in the mid and lower cervical spine. Upper chest: Stable ground-glass nodularity in the RIGHT upper chest compared to studies dating back to 2017 and to the prior CT of the cervical spine may relate to prior radiotherapy as suggested on previous imaging. Other: None IMPRESSION: 1. No acute intracranial abnormality. 2. Bilateral occipital low attenuation compatible with areas of prior infarct, atrophy and chronic microvascular ischemic changes without change since previous imaging. 3. No evidence for acute fracture or static subluxation of the cervical spine. 4. Multilevel degenerative changes and facet arthropathy of the cervical spine grossly similar to the prior study. 5. Stable ground-glass nodularity in the RIGHT upper chest compared to studies dating back to 2017 and to the prior CT of the cervical spine may relate to prior radiotherapy as suggested on previous imaging. Electronically Signed   By: Zetta Bills M.D.   On: 08/01/2020 11:45    Procedures Procedures   Medications Ordered in ED Medications  acetaminophen (TYLENOL) tablet 1,000 mg (1,000 mg Oral Not Given 08/01/20 1343)    ED Course  I have reviewed the triage vital signs and the nursing notes.  Pertinent labs &  imaging results that were available during my care of the patient were reviewed by me and considered in my medical decision making (see chart for details).    MDM Rules/Calculators/A&P                          Patient presents after mechanical fall and isolated back head and elbow injuries.  Plan for x-rays, CT scans as patient is on Eliquis, Tylenol as needed for pain.  General blood work due to age and to make sure there is no signs of significant anemia or electrolyte imbalance.  If imaging unremarkable likely plan for close outpatient follow-up.  Blood work reviewed showing normal white blood cell count, hemoglobin 9.9, creatinine 2.5 overall similar to chronic kidney disease levels.  Patient not requiring oxygen, normal work of breathing on reassessment.  Chest x-ray canceled.  Discussed follow-up and results.  Patient has support in the room and walker at home and feels comfortable going home. X-ray right elbow reviewed showing possible chip fracture, x-ray thoracic shows chronic findings no acute fractures.  CT scan of the head and neck no acute bleeding or fractures.  Tylenol ordered as needed.  EKG reviewed no acute abnormalities.  Patient stable for outpatient follow-up.    Final Clinical Impression(s) / ED Diagnoses Final diagnoses:  Fall, initial encounter  Acute head injury, initial encounter  Acute myofascial strain of lumbar region, initial encounter  Contusion of right elbow, initial encounter    Rx / DC Orders ED Discharge Orders    None       Elnora Morrison, MD 08/01/20 1436

## 2020-08-01 NOTE — ED Notes (Signed)
Patient transported to CT 

## 2020-08-01 NOTE — ED Triage Notes (Signed)
Pt from home via ems; was stepping off of scale this am, lost balance and fell; hit head, no loc; pt on eliquis for afib; no obvious injury to head, denies pain to head, neck, or back; abrasion to R elbow, bit tongue; bleeding controlled; pt a and o x 4 on arrival  174/66 P 80 96$ RA cbg 169 T 98.57f

## 2020-08-02 ENCOUNTER — Emergency Department (HOSPITAL_COMMUNITY): Payer: Medicare Other

## 2020-08-02 ENCOUNTER — Emergency Department (HOSPITAL_COMMUNITY)
Admission: EM | Admit: 2020-08-02 | Discharge: 2020-08-02 | Disposition: A | Discharge status: Home / Self Care | Payer: Medicare Other | Attending: Emergency Medicine | Admitting: Emergency Medicine

## 2020-08-02 ENCOUNTER — Other Ambulatory Visit: Payer: Self-pay

## 2020-08-02 DIAGNOSIS — S0003XA Contusion of scalp, initial encounter: Secondary | ICD-10-CM

## 2020-08-02 DIAGNOSIS — I10 Essential (primary) hypertension: Secondary | ICD-10-CM | POA: Diagnosis not present

## 2020-08-02 DIAGNOSIS — N189 Chronic kidney disease, unspecified: Secondary | ICD-10-CM | POA: Insufficient documentation

## 2020-08-02 DIAGNOSIS — G9389 Other specified disorders of brain: Secondary | ICD-10-CM | POA: Diagnosis not present

## 2020-08-02 DIAGNOSIS — R0902 Hypoxemia: Secondary | ICD-10-CM | POA: Diagnosis not present

## 2020-08-02 DIAGNOSIS — I739 Peripheral vascular disease, unspecified: Secondary | ICD-10-CM | POA: Diagnosis not present

## 2020-08-02 DIAGNOSIS — Y92009 Unspecified place in unspecified non-institutional (private) residence as the place of occurrence of the external cause: Secondary | ICD-10-CM | POA: Insufficient documentation

## 2020-08-02 DIAGNOSIS — G319 Degenerative disease of nervous system, unspecified: Secondary | ICD-10-CM | POA: Diagnosis not present

## 2020-08-02 DIAGNOSIS — W01198A Fall on same level from slipping, tripping and stumbling with subsequent striking against other object, initial encounter: Secondary | ICD-10-CM | POA: Insufficient documentation

## 2020-08-02 DIAGNOSIS — E1122 Type 2 diabetes mellitus with diabetic chronic kidney disease: Secondary | ICD-10-CM | POA: Diagnosis not present

## 2020-08-02 DIAGNOSIS — G4489 Other headache syndrome: Secondary | ICD-10-CM | POA: Diagnosis not present

## 2020-08-02 DIAGNOSIS — S0990XA Unspecified injury of head, initial encounter: Secondary | ICD-10-CM | POA: Diagnosis not present

## 2020-08-02 LAB — URINALYSIS, ROUTINE W REFLEX MICROSCOPIC
Bilirubin Urine: NEGATIVE
Glucose, UA: NEGATIVE mg/dL
Hgb urine dipstick: NEGATIVE
Ketones, ur: NEGATIVE mg/dL
Nitrite: NEGATIVE
Protein, ur: 30 mg/dL — AB
Specific Gravity, Urine: 1.009 (ref 1.005–1.030)
pH: 7 (ref 5.0–8.0)

## 2020-08-02 NOTE — Progress Notes (Signed)
Orthopedic Tech Progress Note Patient Details:  Sabrina [name] 01-27-1927 [id number] Level 2 trauma Patient ID: [name], female   DOB: 03-Nov-1926, 87 y.o.   MRN: [id number]   Janit Pagan 08/02/2020, 5:27 PM

## 2020-08-02 NOTE — ED Provider Notes (Signed)
[facility] EMERGENCY DEPARTMENT Provider Note   CSN: [id number] Arrival date & time: 08/02/20  1704     History No chief complaint on file.   Sabrina [name] is a 87 y.o. female.  Patient is a 87 year old female with a history of DVT on Eliquis, CKD, diabetes who is presenting today with a fall.  Patient reports she was going out to her porch to get a package and she cannot recall if her walker got stuck on the threshold or her foot tripped over the threshold but she fell forward onto the porch and hit the left side of her head.  She denies loss of consciousness.  She did not feel dizzy, weak, lightheaded, chest pain or shortness of breath prior to or after the event.  Unfortunately there was no caregiver at her home that time this happened and she laid out on the porch for 30 to 45 minutes when her neighbor saw her and called 911.  When EMS arrived patient was awake and alert.  She denied any pain.  Patient had a fall yesterday when she was standing on her scale and stepped backwards and fell back and hit her head and bit her tongue.  Her caregiver was there and she came to the emergency room and everything was okay.  She reports today she was moving slower and she was a bit sore but otherwise felt okay.  The history is provided by the patient and the EMS personnel.       No past medical history on file.  There are no problems to display for this patient.      OB History   No obstetric history on file.     No family history on file.     Home Medications Prior to Admission medications   Not on File    Allergies    Patient has no allergy information on record.  Review of Systems   Review of Systems  All other systems reviewed and are negative.   Physical Exam Updated Vital Signs BP (!) 177/60   Pulse 95   Temp 97.8 F (36.6 C) (Oral)   Resp 18   Ht 4\' 10"  (1.473 m)   Wt 77.1 kg   SpO2 98%   BMI 35.53 kg/m   Physical Exam Vitals and  nursing note reviewed.  Constitutional:      General: She is in acute distress.     Appearance: Normal appearance. She is well-developed.  HENT:     Head: Normocephalic. Contusion present.      Mouth/Throat:     Comments: Bruising on the right side of tongue Eyes:     Pupils: Pupils are equal, round, and reactive to light.  Cardiovascular:     Rate and Rhythm: Normal rate and regular rhythm.     Heart sounds: Normal heart sounds. No murmur heard. No friction rub.  Pulmonary:     Effort: Pulmonary effort is normal.     Breath sounds: Normal breath sounds. No wheezing or rales.  Abdominal:     General: Bowel sounds are normal. There is no distension.     Palpations: Abdomen is soft.     Tenderness: There is no abdominal tenderness. There is no guarding or rebound.  Musculoskeletal:        General: No tenderness. Normal range of motion.     Cervical back: Normal range of motion and neck supple. No tenderness.     Right lower leg: No edema.  Left lower leg: No edema.     Comments: No edema  Skin:    General: Skin is warm and dry.     Findings: No rash.  Neurological:     Mental Status: She is alert and oriented to person, place, and time. Mental status is at baseline.     Cranial Nerves: No cranial nerve deficit.  Psychiatric:        Mood and Affect: Mood normal.        Behavior: Behavior normal.     ED Results / Procedures / Treatments   Labs (all labs ordered are listed, but only abnormal results are displayed) Labs Reviewed  URINALYSIS, ROUTINE W REFLEX MICROSCOPIC - Abnormal; Notable for the following components:      Result Value   APPearance HAZY (*)    Protein, ur 30 (*)    Leukocytes,Ua LARGE (*)    Bacteria, UA RARE (*)    All other components within normal limits    EKG None  Radiology CT Head Wo Contrast  Result Date: 08/02/2020 CLINICAL DATA:  Fall, on blood thinners.  Hit left side of head. EXAM: CT HEAD WITHOUT CONTRAST TECHNIQUE: Contiguous  axial images were obtained from the base of the skull through the vertex without intravenous contrast. COMPARISON:  08/01/2020 FINDINGS: Brain: Old bilateral occipital infarcts with encephalomalacia. There is atrophy and chronic small vessel disease changes. No acute intracranial abnormality. Specifically, no hemorrhage, hydrocephalus, mass lesion, acute infarction, or significant intracranial injury. Vascular: No hyperdense vessel or unexpected calcification. Skull: No acute calvarial abnormality. Sinuses/Orbits: No acute findings Other: None IMPRESSION: Old bilateral occipital infarcts. Atrophy, chronic microvascular disease. No acute intracranial abnormality. Electronically Signed   By: Rolm Baptise M.D.   On: 08/02/2020 18:03    Procedures Procedures   Medications Ordered in ED Medications - No data to display  ED Course  I have reviewed the triage vital signs and the nursing notes.  Pertinent labs & imaging results that were available during my care of the patient were reviewed by me and considered in my medical decision making (see chart for details).    MDM Rules/Calculators/A&P                          Elderly female presenting today after a fall at home.  Her caregiver was not there and she was going out on the porch to get a package and tripped over the threshold.  Patient did hit the left side of her head and was a level 2 trauma because of her age and being on Eliquis.  She had no loss of consciousness and is neurologically intact here.  She has no neck tenderness.  Patient was seen yesterday because of another fall related to losing her balance.  She had negative imaging at that time and yesterday had a CBC and BMP that had no acute findings.  Today she is awake alert and well-appearing.  We will do CT but reports that her caregiver is now at her home and feel that she will be stable for discharge if CT is negative.  8:02 PM Head CT without acute process.  Caregiver is asking that  we check her urine.  Pt has no urinary complaints. UA without classic findings for UTI.  It is contaminated with 6-10 epithelial cells but has rare bacterial and only 21-50 wBC's.  Pt is assymptomatic and low suspicion for UTI today.  MDM Number of Diagnoses or Management Options  Amount and/or Complexity of Data Reviewed Clinical lab tests: ordered and reviewed Tests in the radiology section of CPT: ordered and reviewed Independent visualization of images, tracings, or specimens: yes     Final Clinical Impression(s) / ED Diagnoses Final diagnoses:  Contusion of scalp, initial encounter    Rx / DC Orders ED Discharge Orders    None       Blanchie Dessert, MD 08/02/20 2044

## 2020-08-02 NOTE — Discharge Instructions (Signed)
Cat scan was normal today.  Urine without signs of infection.

## 2020-08-02 NOTE — ED Notes (Signed)
Trauma Response Nurse Note-  Reason for Call / Reason for Trauma activation:   -L2 fall on thinners  Initial Focused Assessment (If applicable, or please see trauma documentation):  -A&Ox4 -hematoma to L head near temple.  Interventions:  - Took pt to CT - Hooked purewick up  Plan of Care as of this note:  - Awaiting CT results  Event Summary:   - Pt went to front door and bent down and attempted to pick up package.  She tripped over walker and fell and hit head.

## 2020-08-02 NOTE — ED Notes (Signed)
Pt arrives via EMS from home. Fall on thinners, Eliquisis. Mechanical fall. Hit head on left side. EMS cleared C-Spine on scene. Moving all extremities freely. Alert and oriented X4.   188/88 HR 90 RR 18 T 98.4 99% Ra

## 2020-08-03 ENCOUNTER — Encounter: Payer: Self-pay | Admitting: Oncology

## 2020-08-04 DIAGNOSIS — I129 Hypertensive chronic kidney disease with stage 1 through stage 4 chronic kidney disease, or unspecified chronic kidney disease: Secondary | ICD-10-CM | POA: Diagnosis not present

## 2020-08-04 DIAGNOSIS — D6869 Other thrombophilia: Secondary | ICD-10-CM | POA: Diagnosis not present

## 2020-08-04 DIAGNOSIS — I480 Paroxysmal atrial fibrillation: Secondary | ICD-10-CM | POA: Diagnosis not present

## 2020-08-04 DIAGNOSIS — N184 Chronic kidney disease, stage 4 (severe): Secondary | ICD-10-CM | POA: Diagnosis not present

## 2020-08-09 DIAGNOSIS — M6281 Muscle weakness (generalized): Secondary | ICD-10-CM | POA: Diagnosis not present

## 2020-08-09 DIAGNOSIS — I10 Essential (primary) hypertension: Secondary | ICD-10-CM | POA: Diagnosis not present

## 2020-08-09 DIAGNOSIS — N184 Chronic kidney disease, stage 4 (severe): Secondary | ICD-10-CM | POA: Diagnosis not present

## 2020-08-09 DIAGNOSIS — Z9181 History of falling: Secondary | ICD-10-CM | POA: Diagnosis not present

## 2020-08-09 DIAGNOSIS — I480 Paroxysmal atrial fibrillation: Secondary | ICD-10-CM | POA: Diagnosis not present

## 2020-08-09 DIAGNOSIS — E119 Type 2 diabetes mellitus without complications: Secondary | ICD-10-CM | POA: Diagnosis not present

## 2020-08-09 DIAGNOSIS — D6869 Other thrombophilia: Secondary | ICD-10-CM | POA: Diagnosis not present

## 2020-08-09 DIAGNOSIS — R269 Unspecified abnormalities of gait and mobility: Secondary | ICD-10-CM | POA: Diagnosis not present

## 2020-08-09 DIAGNOSIS — I129 Hypertensive chronic kidney disease with stage 1 through stage 4 chronic kidney disease, or unspecified chronic kidney disease: Secondary | ICD-10-CM | POA: Diagnosis not present

## 2020-08-09 NOTE — Progress Notes (Signed)
[facility] Summary Report / Loop Recorder 

## 2020-08-11 DIAGNOSIS — I129 Hypertensive chronic kidney disease with stage 1 through stage 4 chronic kidney disease, or unspecified chronic kidney disease: Secondary | ICD-10-CM | POA: Diagnosis not present

## 2020-08-11 DIAGNOSIS — E119 Type 2 diabetes mellitus without complications: Secondary | ICD-10-CM | POA: Diagnosis not present

## 2020-08-11 DIAGNOSIS — I10 Essential (primary) hypertension: Secondary | ICD-10-CM | POA: Diagnosis not present

## 2020-08-11 DIAGNOSIS — D6869 Other thrombophilia: Secondary | ICD-10-CM | POA: Diagnosis not present

## 2020-08-11 DIAGNOSIS — M6281 Muscle weakness (generalized): Secondary | ICD-10-CM | POA: Diagnosis not present

## 2020-08-11 DIAGNOSIS — I480 Paroxysmal atrial fibrillation: Secondary | ICD-10-CM | POA: Diagnosis not present

## 2020-08-11 DIAGNOSIS — Z9181 History of falling: Secondary | ICD-10-CM | POA: Diagnosis not present

## 2020-08-11 DIAGNOSIS — N184 Chronic kidney disease, stage 4 (severe): Secondary | ICD-10-CM | POA: Diagnosis not present

## 2020-08-11 DIAGNOSIS — R269 Unspecified abnormalities of gait and mobility: Secondary | ICD-10-CM | POA: Diagnosis not present

## 2020-08-16 ENCOUNTER — Ambulatory Visit (HOSPITAL_COMMUNITY)
Admission: RE | Admit: 2020-08-16 | Discharge: 2020-08-16 | Disposition: A | Discharge status: Home / Self Care | Payer: Medicare Other | Source: Ambulatory Visit | Attending: Physician Assistant | Admitting: Physician Assistant

## 2020-08-16 ENCOUNTER — Other Ambulatory Visit: Payer: Self-pay

## 2020-08-16 ENCOUNTER — Encounter (HOSPITAL_COMMUNITY): Payer: Self-pay | Admitting: Physician Assistant

## 2020-08-16 VITALS — BP 142/50 | HR 79 | Ht <= 58 in | Wt 171.4 lb

## 2020-08-16 DIAGNOSIS — R5381 Other malaise: Secondary | ICD-10-CM | POA: Insufficient documentation

## 2020-08-16 DIAGNOSIS — Z7984 Long term (current) use of oral hypoglycemic drugs: Secondary | ICD-10-CM | POA: Diagnosis not present

## 2020-08-16 DIAGNOSIS — I10 Essential (primary) hypertension: Secondary | ICD-10-CM | POA: Diagnosis not present

## 2020-08-16 DIAGNOSIS — M6281 Muscle weakness (generalized): Secondary | ICD-10-CM | POA: Diagnosis not present

## 2020-08-16 DIAGNOSIS — I480 Paroxysmal atrial fibrillation: Secondary | ICD-10-CM | POA: Diagnosis not present

## 2020-08-16 DIAGNOSIS — Z86711 Personal history of pulmonary embolism: Secondary | ICD-10-CM | POA: Diagnosis not present

## 2020-08-16 DIAGNOSIS — Z7901 Long term (current) use of anticoagulants: Secondary | ICD-10-CM | POA: Diagnosis not present

## 2020-08-16 DIAGNOSIS — Z86718 Personal history of other venous thrombosis and embolism: Secondary | ICD-10-CM | POA: Insufficient documentation

## 2020-08-16 DIAGNOSIS — Z8249 Family history of ischemic heart disease and other diseases of the circulatory system: Secondary | ICD-10-CM | POA: Insufficient documentation

## 2020-08-16 DIAGNOSIS — D6869 Other thrombophilia: Secondary | ICD-10-CM

## 2020-08-16 DIAGNOSIS — Z8673 Personal history of transient ischemic attack (TIA), and cerebral infarction without residual deficits: Secondary | ICD-10-CM | POA: Insufficient documentation

## 2020-08-16 DIAGNOSIS — Z9181 History of falling: Secondary | ICD-10-CM | POA: Diagnosis not present

## 2020-08-16 DIAGNOSIS — Z853 Personal history of malignant neoplasm of breast: Secondary | ICD-10-CM | POA: Diagnosis not present

## 2020-08-16 DIAGNOSIS — I129 Hypertensive chronic kidney disease with stage 1 through stage 4 chronic kidney disease, or unspecified chronic kidney disease: Secondary | ICD-10-CM | POA: Insufficient documentation

## 2020-08-16 DIAGNOSIS — E1122 Type 2 diabetes mellitus with diabetic chronic kidney disease: Secondary | ICD-10-CM | POA: Diagnosis not present

## 2020-08-16 DIAGNOSIS — R269 Unspecified abnormalities of gait and mobility: Secondary | ICD-10-CM | POA: Diagnosis not present

## 2020-08-16 DIAGNOSIS — N183 Chronic kidney disease, stage 3 unspecified: Secondary | ICD-10-CM | POA: Insufficient documentation

## 2020-08-16 DIAGNOSIS — N184 Chronic kidney disease, stage 4 (severe): Secondary | ICD-10-CM | POA: Diagnosis not present

## 2020-08-16 DIAGNOSIS — Z87891 Personal history of nicotine dependence: Secondary | ICD-10-CM | POA: Diagnosis not present

## 2020-08-16 DIAGNOSIS — E119 Type 2 diabetes mellitus without complications: Secondary | ICD-10-CM | POA: Diagnosis not present

## 2020-08-16 NOTE — Progress Notes (Signed)
Primary Care Physician: [name], MD Primary Electrophysiologist: Dr Lovena Le Referring Physician: Dr [facility]   [facility] Sabrina [name] is a 87 y.o. female with a history of hypertension, breast cancer, chemotherapy-induced DVT and PE, type 2 diabetes on oral hypoglycemics, hyperlipidemia, chronic debility, stage III chronic kidney disease and CVA who presents for follow up in the [facility].  The patient was initially diagnosed with atrial fibrillation on her ILR on 09/26/18. She was asymptomatic during this episode. She was actually hospitalized at the time for a mechanical fall and clavicle fracture.   On follow up today, patient reports she has done well from a cardiac standpoint. She did have two mechanical falls one day apart. Workup at that ED for both did not show any intracranial bleeding. Her ILR shows 0%afib burden.   Today, she denies symptoms of palpitations, chest pain, shortness of breath, orthopnea, PND, dizziness, presyncope, syncope, snoring, daytime somnolence, bleeding, or neurologic sequela. The patient is tolerating medications without difficulties and is otherwise without complaint today.    Atrial Fibrillation Risk Factors:  she does not have symptoms or diagnosis of sleep apnea.  she has a BMI of Body mass index is 35.82 kg/m.[name] Filed Weights   08/16/20 1022  Weight: 77.7 kg     Family History  Problem Relation Age of Onset   Pneumonia Mother    Heart attack Father    Breast cancer Other 1       niece   Breast cancer Other 65       niece   Ovarian cancer Other        niece     Atrial Fibrillation Management history:  Previous antiarrhythmic drugs: none Previous cardioversions: none Previous ablations: none CHADS2VASC score: 7 Anticoagulation history: Eliquis   Past Medical History:  Diagnosis Date   Arthritis    "mostly in my hands, lower back" (06/25/2017)   Basal cell carcinoma (BCC) of face 1983    Breast cancer, right breast ([place]) 03/2013   Chronic kidney disease (CKD), stage III (moderate) ([place])    nephrologist, Dr. [name]   Dental crowns present    DVT (deep venous thrombosis) ([place]) ~ 06/2013   "? side"   Gout    "on daily RX" (06/25/2017)   Heart murmur    no known problems; states did not know she had murmur until age 74   High cholesterol    Hypertension    fluctuates, especially when stressed; has been on med. > 20 yr.   Immature cataract    Non-insulin dependent type 2 diabetes mellitus ([place])    Personal history of chemotherapy    Personal history of radiation therapy    Pulmonary embolism ([place]) ~ 06/2013   Radiation 09/06/13-10/20/13   Right Breast Cancer   Stroke ([place]) 05/2017   just visual problems since (06/25/2017)   Wears partial dentures    lower   Past Surgical History:  Procedure Laterality Date   AXILLARY LYMPH NODE DISSECTION Right 03/30/2013   Procedure: AXILLARY LYMPH NODE DISSECTION;  Surgeon: Rolm Bookbinder, MD;  Location: [facility];  Service: General;  Laterality: Right;   BASAL CELL CARCINOMA EXCISION  1983   "face"   BREAST BIOPSY Right 03/17/2013   BREAST CYST EXCISION Right 11/1958   benign   BREAST LUMPECTOMY Right 03/30/2013   BREAST LUMPECTOMY WITH NEEDLE LOCALIZATION Right 03/30/2013   Procedure: BREAST LUMPECTOMY WITH NEEDLE LOCALIZATION;  Surgeon: Rolm Bookbinder, MD;  Location: [facility];  Service: General;  Laterality: Right;   DILATION AND CURETTAGE OF UTERUS     LOOP RECORDER INSERTION N/A 08/15/2017   Procedure: LOOP RECORDER INSERTION;  Surgeon: Evans Lance, MD;  Location: [facility];  Service: Cardiovascular;  Laterality: N/A;   PORT-A-CATH REMOVAL  2016   PORTACATH PLACEMENT N/A 04/15/2013   Procedure: INSERTION PORT-A-CATH;  Surgeon: Rolm Bookbinder, MD;  Location: [facility];  Service: General;  Laterality: N/A;   RE-EXCISION OF BREAST CANCER,SUPERIOR MARGINS Right 04/15/2013   Procedure:  RE-EXCISION OF RIGHT BREAST  MARGINS;  Surgeon: Rolm Bookbinder, MD;  Location: [facility];  Service: General;  Laterality: Right;   TONSILLECTOMY  ~ 1935/1936    Current Outpatient Medications  Medication Sig Dispense Refill   albuterol (VENTOLIN HFA) 108 (90 Base) MCG/ACT inhaler Inhale 1-2 puffs into the lungs every 6 (six) hours as needed for wheezing or shortness of breath.     allopurinol (ZYLOPRIM) 100 MG tablet Take 100 mg by mouth daily.      amLODipine (NORVASC) 2.5 MG tablet Take 2.5 mg by mouth daily.     cholecalciferol (VITAMIN D) 1000 UNITS tablet Take 1,000 Units by mouth daily.     cloNIDine (CATAPRES) 0.2 MG tablet Take 0.2 mg by mouth 2 (two) times daily.     COVID-19 mRNA vaccine, Pfizer, 30 MCG/0.3ML injection Inject into the muscle. 0.3 mL 0   denosumab (PROLIA) 60 MG/ML SOSY injection Inject 60 mg into the skin every 6 (six) months.     ELIQUIS 2.5 MG TABS tablet TAKE 1 TABLET BY MOUTH TWICE DAILY. 60 tablet 11   famotidine (PEPCID) 20 MG tablet Take 20 mg by mouth daily.     furosemide (LASIX) 40 MG tablet Take 40 mg by mouth daily.     gabapentin (NEURONTIN) 100 MG capsule Take 3 capsules (300 mg total) by mouth at bedtime. Take 200 mg by mouth twice a day and take 300 mg by mouth at bedtime     glipiZIDE (GLUCOTROL) 5 MG tablet Take 5 mg by mouth 2 (two) times daily before a meal.      hydrALAZINE (APRESOLINE) 50 MG tablet Take 50 mg by mouth 3 (three) times daily.     hydrocortisone cream 1 % Apply 1 application topically daily as needed for itching.     polyethylene glycol (MIRALAX / GLYCOLAX) packet Take 17 g by mouth daily.     traMADol (ULTRAM) 50 MG tablet Take 50 mg by mouth 2 (two) times daily as needed for moderate pain.      TRUE METRIX BLOOD GLUCOSE TEST test strip      TRUEplus Lancets 28G MISC Apply topically.     hydrALAZINE (APRESOLINE) 10 MG tablet Take 1 tablet (10 mg total) by mouth 3 (three) times daily. 90 tablet 2   No current  facility-administered medications for this encounter.    Allergies  Allergen Reactions   Beta Adrenergic Blockers Other (See Comments)    Unknown    Social History   Socioeconomic History   Marital status: Married    Spouse name: Not on file   Number of children: 0   Years of education: Not on file   Highest education level: Bachelor's degree (e.g., BA, AB, BS)  Occupational History   Not on file  Tobacco Use   Smoking status: Never   Smokeless tobacco: Never   Tobacco comments:    only smoked 2 packs cigarettes total; husband quit in 1971  Vaping Use   Vaping Use: Never used  Substance and Sexual Activity   Alcohol use: Not Currently    Alcohol/week: 3.0 standard drinks    Types: 3 Glasses of wine per week   Drug use: No   Sexual activity: Not Currently  Other Topics Concern   Not on file  Social History Narrative   Lives at home with her husband   Right handed   Caffeine: 1 coffee daily at most    Social Determinants of Health   Financial Resource Strain: Not on file  Food Insecurity: Not on file  Transportation Needs: Not on file  Physical Activity: Not on file  Stress: Not on file  Social Connections: Not on file  Intimate Partner Violence: Not on file     ROS- All systems are reviewed and negative except as per the HPI above.  Physical Exam: Vitals:   08/16/20 1022  BP: (!) 142/50  Pulse: 79  Weight: 77.7 kg  Height: 4\' 10"  (1.473 m)     GEN- The patient is a well appearing obese elderly female, alert and oriented x 3 today.   HEENT-head normocephalic, atraumatic, sclera clear, conjunctiva pink, hearing intact, trachea midline. Lungs- Clear to ausculation bilaterally, normal work of breathing Heart- Regular rate and rhythm, no murmurs, rubs or gallops  GI- soft, NT, ND, + BS Extremities- no clubbing, cyanosis, or edema MS- no significant deformity or atrophy Skin- no rash or lesion Psych- euthymic mood, full affect Neuro- strength and  sensation are intact   Wt Readings from Last 3 Encounters:  08/16/20 77.7 kg  08/02/20 77.1 kg  08/01/20 77.1 kg    EKG today demonstrates  SR Vent. rate 79 BPM PR interval 180 ms QRS duration 92 ms QT/QTcB 388/444 ms  Echo 05/27/17 demonstrated  - Procedure narrative: Transthoracic echocardiography. Image   quality was poor. The study was technically difficult, as a   result of poor sound wave transmission. - Left ventricle: The cavity size was normal. Wall thickness was   increased in a pattern of mild LVH. There was mild concentric   hypertrophy. Systolic function was normal. The estimated ejection   fraction was in the range of 55% to 60%. Wall motion was normal;   there were no regional wall motion abnormalities. Doppler   parameters are consistent with abnormal left ventricular   relaxation (grade 1 diastolic dysfunction). - Aortic valve: There was mild stenosis. Valve area (VTI): 1.75   cm^2. Valve area (Vmax): 1.8 cm^2. Valve area (Vmean): 1.7 cm^2. - Mitral valve: Severely calcified annulus. Valve area by   continuity equation (using LVOT flow): 1.95 cm^2. - Atrial septum: No defect or patent foramen ovale was identified.  Epic records are reviewed at length today  Assessment and Plan:  1. Paroxysmal atrial fibrillation ILR still shows 0% afib burden Continue Eliquis 2.5 mg BID (age, Cr 2.58)  This patients CHA2DS2-VASc Score and unadjusted Ischemic Stroke Rate (% per year) is equal to 11.2 % stroke rate/year from a score of 7  Above score calculated as 1 point each if present [CHF, HTN, DM, Vascular=MI/PAD/Aortic Plaque, Age if 65-74, or Female] Above score calculated as 2 points each if present [Age > 75, or Stroke/TIA/TE]  2. HTN Stable, no changes today.   Follow up with Dr Lovena Le in 6 months. AF clinic in one year.    [facility] [address] [place], [place] [address] [phone] 08/16/2020 10:33 AM

## 2020-08-18 DIAGNOSIS — E119 Type 2 diabetes mellitus without complications: Secondary | ICD-10-CM | POA: Diagnosis not present

## 2020-08-18 DIAGNOSIS — Z9181 History of falling: Secondary | ICD-10-CM | POA: Diagnosis not present

## 2020-08-18 DIAGNOSIS — M6281 Muscle weakness (generalized): Secondary | ICD-10-CM | POA: Diagnosis not present

## 2020-08-18 DIAGNOSIS — N184 Chronic kidney disease, stage 4 (severe): Secondary | ICD-10-CM | POA: Diagnosis not present

## 2020-08-18 DIAGNOSIS — R269 Unspecified abnormalities of gait and mobility: Secondary | ICD-10-CM | POA: Diagnosis not present

## 2020-08-18 DIAGNOSIS — D6869 Other thrombophilia: Secondary | ICD-10-CM | POA: Diagnosis not present

## 2020-08-18 DIAGNOSIS — I480 Paroxysmal atrial fibrillation: Secondary | ICD-10-CM | POA: Diagnosis not present

## 2020-08-18 DIAGNOSIS — I10 Essential (primary) hypertension: Secondary | ICD-10-CM | POA: Diagnosis not present

## 2020-08-18 DIAGNOSIS — I129 Hypertensive chronic kidney disease with stage 1 through stage 4 chronic kidney disease, or unspecified chronic kidney disease: Secondary | ICD-10-CM | POA: Diagnosis not present

## 2020-08-21 ENCOUNTER — Ambulatory Visit (INDEPENDENT_AMBULATORY_CARE_PROVIDER_SITE_OTHER): Payer: Medicare Other

## 2020-08-21 DIAGNOSIS — I480 Paroxysmal atrial fibrillation: Secondary | ICD-10-CM | POA: Diagnosis not present

## 2020-08-22 LAB — CUP PACEART REMOTE DEVICE CHECK
Date Time Interrogation Session: 20220616232221
Implantable Pulse Generator Implant Date: 20190614

## 2020-08-23 DIAGNOSIS — E119 Type 2 diabetes mellitus without complications: Secondary | ICD-10-CM | POA: Diagnosis not present

## 2020-08-23 DIAGNOSIS — R269 Unspecified abnormalities of gait and mobility: Secondary | ICD-10-CM | POA: Diagnosis not present

## 2020-08-23 DIAGNOSIS — I129 Hypertensive chronic kidney disease with stage 1 through stage 4 chronic kidney disease, or unspecified chronic kidney disease: Secondary | ICD-10-CM | POA: Diagnosis not present

## 2020-08-23 DIAGNOSIS — I480 Paroxysmal atrial fibrillation: Secondary | ICD-10-CM | POA: Diagnosis not present

## 2020-08-23 DIAGNOSIS — D6869 Other thrombophilia: Secondary | ICD-10-CM | POA: Diagnosis not present

## 2020-08-23 DIAGNOSIS — N184 Chronic kidney disease, stage 4 (severe): Secondary | ICD-10-CM | POA: Diagnosis not present

## 2020-08-23 DIAGNOSIS — Z9181 History of falling: Secondary | ICD-10-CM | POA: Diagnosis not present

## 2020-08-23 DIAGNOSIS — I10 Essential (primary) hypertension: Secondary | ICD-10-CM | POA: Diagnosis not present

## 2020-08-23 DIAGNOSIS — M6281 Muscle weakness (generalized): Secondary | ICD-10-CM | POA: Diagnosis not present

## 2020-08-24 ENCOUNTER — Ambulatory Visit: Payer: Medicare Other | Admitting: Sports Medicine

## 2020-08-24 ENCOUNTER — Other Ambulatory Visit: Payer: Self-pay

## 2020-08-24 ENCOUNTER — Encounter: Payer: Self-pay | Admitting: Sports Medicine

## 2020-08-24 DIAGNOSIS — I739 Peripheral vascular disease, unspecified: Secondary | ICD-10-CM | POA: Diagnosis not present

## 2020-08-24 DIAGNOSIS — M79675 Pain in left toe(s): Secondary | ICD-10-CM | POA: Diagnosis not present

## 2020-08-24 DIAGNOSIS — E119 Type 2 diabetes mellitus without complications: Secondary | ICD-10-CM | POA: Diagnosis not present

## 2020-08-24 DIAGNOSIS — B351 Tinea unguium: Secondary | ICD-10-CM | POA: Diagnosis not present

## 2020-08-24 DIAGNOSIS — M79674 Pain in right toe(s): Secondary | ICD-10-CM

## 2020-08-24 NOTE — Progress Notes (Signed)
Subjective: Sabrina [name] is a 87 y.o. female patient with history of diabetes who presents to office today complaining of long,mildly painful nails while in shoes; unable to trim. Patient denies any changes with medical history except having swelling unable to do ace wraps.  No other pedal complaints noted.   Patient is assisted by facility aide again this visit.   Patient Active Problem List   Diagnosis Date Noted   Abnormal gait 05/18/2020   Body mass index (BMI) 35.0-35.9, adult 05/18/2020   Cardiac pacemaker in situ 05/18/2020   Diabetic nephropathy ([place]) 05/18/2020   IgM monoclonal gammopathy of uncertain significance 05/18/2020   Malignant hypertensive chronic kidney disease 05/18/2020   Morbid obesity ([place]) 05/18/2020   Thrombophilia ([place]) 05/18/2020   Secondary hypercoagulable state ([place]) 08/16/2019   Osteoporosis 05/04/2019   Hypercalcemia 11/04/2018   History of stroke 11/04/2018   Paroxysmal atrial fibrillation ([place]) 11/04/2018   Acute kidney injury ([place])    Acute metabolic encephalopathy 56/43/3295   Personal history of DVT and pulm embolus 10/26/2018   Personal history of other venous thrombosis and embolism 10/26/2018   UTI (urinary tract infection) 10/24/2018   Clavicle fracture 09/26/2018   Degeneration of lumbar intervertebral disc 10/01/2017   Blurry vision, bilateral    Acute blood loss anemia    History of breast cancer 05/30/2017   Physical debility 05/30/2017   Occipital infarction ([place]) 05/30/2017   Pressure injury of skin 05/28/2017   Fall at home 05/27/2017   CKD (chronic kidney disease), stage IV ([place]) 05/27/2017   High cholesterol 05/27/2017   DM (diabetes mellitus) ([place]) 05/27/2017   Hypertension 05/27/2017   Gout 05/27/2017   Elevated troponin 05/27/2017   Lumbar radiculopathy 05/15/2017   Pain of right calf 04/30/2017   Asymmetrical left sensorineural hearing loss 02/14/2016   Impacted cerumen of left ear 02/14/2016   Lipoma of lower  extremity 09/12/2014   Abnormal x-ray 03/15/2014   Chronic diastolic congestive heart failure ([place]) 02/23/2014   Candidiasis of skin 01/13/2014   Osteopenia 11/30/2013   Hypoglycemia 09/13/2013   Acute respiratory failure with hypoxia ([place]) 06/14/2013   History of pulmonary embolism 06/10/2013   Nausea and vomiting 06/10/2013   Accelerated hypertension 06/10/2013   Candidiasis of vagina 05/19/2013   Aortic stenosis 04/22/2013   Edema leg 04/22/2013   Breast cancer of upper-outer quadrant of right female breast ([place]); in remission 03/19/2013   Current Outpatient Medications on File Prior to Visit  Medication Sig Dispense Refill   albuterol (VENTOLIN HFA) 108 (90 Base) MCG/ACT inhaler Inhale 1-2 puffs into the lungs every 6 (six) hours as needed for wheezing or shortness of breath.     allopurinol (ZYLOPRIM) 100 MG tablet Take 100 mg by mouth daily.      amLODipine (NORVASC) 2.5 MG tablet Take 2.5 mg by mouth daily.     cholecalciferol (VITAMIN D) 1000 UNITS tablet Take 1,000 Units by mouth daily.     cloNIDine (CATAPRES) 0.2 MG tablet Take 0.2 mg by mouth 2 (two) times daily.     COVID-19 mRNA vaccine, Pfizer, 30 MCG/0.3ML injection Inject into the muscle. 0.3 mL 0   denosumab (PROLIA) 60 MG/ML SOSY injection Inject 60 mg into the skin every 6 (six) months.     ELIQUIS 2.5 MG TABS tablet TAKE 1 TABLET BY MOUTH TWICE DAILY. 60 tablet 11   famotidine (PEPCID) 20 MG tablet Take 20 mg by mouth daily.     furosemide (LASIX) 40 MG tablet Take 40 mg by  mouth daily.     gabapentin (NEURONTIN) 100 MG capsule Take 3 capsules (300 mg total) by mouth at bedtime. Take 200 mg by mouth twice a day and take 300 mg by mouth at bedtime     glipiZIDE (GLUCOTROL) 5 MG tablet Take 5 mg by mouth 2 (two) times daily before a meal.      hydrALAZINE (APRESOLINE) 10 MG tablet Take 1 tablet (10 mg total) by mouth 3 (three) times daily. 90 tablet 2   hydrALAZINE (APRESOLINE) 50 MG tablet Take 50 mg by mouth 3  (three) times daily.     hydrocortisone cream 1 % Apply 1 application topically daily as needed for itching.     polyethylene glycol (MIRALAX / GLYCOLAX) packet Take 17 g by mouth daily.     traMADol (ULTRAM) 50 MG tablet Take 50 mg by mouth 2 (two) times daily as needed for moderate pain.      TRUE METRIX BLOOD GLUCOSE TEST test strip      TRUEplus Lancets 28G MISC Apply topically.     No current facility-administered medications on file prior to visit.   Allergies  Allergen Reactions   Beta Adrenergic Blockers Other (See Comments)    Unknown    Objective: General: Patient is awake, alert, and oriented x 3 and in no acute distress.  Integument: Skin is warm, dry and supple bilateral. Nails are tender, long, thickened and dystrophic with subungual debris, consistent with onychomycosis, 1-5 bilateral. No signs of infection. No open lesions or preulcerative lesions present bilateral. Remaining integument unremarkable.  Vasculature:  Dorsalis Pedis pulse 1/4 bilateral. Posterior Tibial pulse  0/4 bilateral. Capillary fill time <5 sec 1-5 bilateral. Scant hair growth to the level of the digits.Temperature gradient within normal limits. No varicosities present bilateral.  1+ pitting edema present bilateral.   Neurology: Sensation present via light touch bilateral.   Musculoskeletal:Asymptomatic bunion and hammertoe pedal deformities noted bilateral. Muscular strength 5/5 in all lower extremity muscular groups bilateral without pain on range of motion. No tenderness with calf compression bilateral.   Assessment and Plan: Problem List Items Addressed This Visit   None Visit Diagnoses     Pain due to onychomycosis of toenails of both feet    -  Primary   PVD (peripheral vascular disease) ([place])       Diabetes mellitus without complication ([place])          -Examined patient. -Re-Discussed and educated patient on diabetic foot care -Mechanically debrided all nails 1-5 bilateral using  sterile nail nipper and filed with dremel without incident -Continue with elevation for edema control and medication management like before and dispensed surgigrip sleeves and recommend neosporin as needed to blisters that may pop up on legs because of edema as needed  -Patient to return  in 3 months for at risk foot care -Patient advised to call the office if any problems or questions arise in the meantime.  Landis Martins, DPM

## 2020-08-25 DIAGNOSIS — D6869 Other thrombophilia: Secondary | ICD-10-CM | POA: Diagnosis not present

## 2020-08-25 DIAGNOSIS — R269 Unspecified abnormalities of gait and mobility: Secondary | ICD-10-CM | POA: Diagnosis not present

## 2020-08-25 DIAGNOSIS — Z9181 History of falling: Secondary | ICD-10-CM | POA: Diagnosis not present

## 2020-08-25 DIAGNOSIS — I10 Essential (primary) hypertension: Secondary | ICD-10-CM | POA: Diagnosis not present

## 2020-08-25 DIAGNOSIS — I129 Hypertensive chronic kidney disease with stage 1 through stage 4 chronic kidney disease, or unspecified chronic kidney disease: Secondary | ICD-10-CM | POA: Diagnosis not present

## 2020-08-25 DIAGNOSIS — N184 Chronic kidney disease, stage 4 (severe): Secondary | ICD-10-CM | POA: Diagnosis not present

## 2020-08-25 DIAGNOSIS — M6281 Muscle weakness (generalized): Secondary | ICD-10-CM | POA: Diagnosis not present

## 2020-08-25 DIAGNOSIS — I480 Paroxysmal atrial fibrillation: Secondary | ICD-10-CM | POA: Diagnosis not present

## 2020-08-25 DIAGNOSIS — E119 Type 2 diabetes mellitus without complications: Secondary | ICD-10-CM | POA: Diagnosis not present

## 2020-09-01 DIAGNOSIS — D6869 Other thrombophilia: Secondary | ICD-10-CM | POA: Diagnosis not present

## 2020-09-01 DIAGNOSIS — E119 Type 2 diabetes mellitus without complications: Secondary | ICD-10-CM | POA: Diagnosis not present

## 2020-09-01 DIAGNOSIS — I129 Hypertensive chronic kidney disease with stage 1 through stage 4 chronic kidney disease, or unspecified chronic kidney disease: Secondary | ICD-10-CM | POA: Diagnosis not present

## 2020-09-01 DIAGNOSIS — N184 Chronic kidney disease, stage 4 (severe): Secondary | ICD-10-CM | POA: Diagnosis not present

## 2020-09-01 DIAGNOSIS — R269 Unspecified abnormalities of gait and mobility: Secondary | ICD-10-CM | POA: Diagnosis not present

## 2020-09-01 DIAGNOSIS — M6281 Muscle weakness (generalized): Secondary | ICD-10-CM | POA: Diagnosis not present

## 2020-09-01 DIAGNOSIS — I480 Paroxysmal atrial fibrillation: Secondary | ICD-10-CM | POA: Diagnosis not present

## 2020-09-01 DIAGNOSIS — I10 Essential (primary) hypertension: Secondary | ICD-10-CM | POA: Diagnosis not present

## 2020-09-01 DIAGNOSIS — Z9181 History of falling: Secondary | ICD-10-CM | POA: Diagnosis not present

## 2020-09-06 DIAGNOSIS — I10 Essential (primary) hypertension: Secondary | ICD-10-CM | POA: Diagnosis not present

## 2020-09-06 DIAGNOSIS — D6869 Other thrombophilia: Secondary | ICD-10-CM | POA: Diagnosis not present

## 2020-09-06 DIAGNOSIS — N184 Chronic kidney disease, stage 4 (severe): Secondary | ICD-10-CM | POA: Diagnosis not present

## 2020-09-06 DIAGNOSIS — Z9181 History of falling: Secondary | ICD-10-CM | POA: Diagnosis not present

## 2020-09-06 DIAGNOSIS — M6281 Muscle weakness (generalized): Secondary | ICD-10-CM | POA: Diagnosis not present

## 2020-09-06 DIAGNOSIS — E119 Type 2 diabetes mellitus without complications: Secondary | ICD-10-CM | POA: Diagnosis not present

## 2020-09-06 DIAGNOSIS — I480 Paroxysmal atrial fibrillation: Secondary | ICD-10-CM | POA: Diagnosis not present

## 2020-09-06 DIAGNOSIS — R269 Unspecified abnormalities of gait and mobility: Secondary | ICD-10-CM | POA: Diagnosis not present

## 2020-09-06 DIAGNOSIS — I129 Hypertensive chronic kidney disease with stage 1 through stage 4 chronic kidney disease, or unspecified chronic kidney disease: Secondary | ICD-10-CM | POA: Diagnosis not present

## 2020-09-08 DIAGNOSIS — I480 Paroxysmal atrial fibrillation: Secondary | ICD-10-CM | POA: Diagnosis not present

## 2020-09-08 DIAGNOSIS — M6281 Muscle weakness (generalized): Secondary | ICD-10-CM | POA: Diagnosis not present

## 2020-09-08 DIAGNOSIS — I129 Hypertensive chronic kidney disease with stage 1 through stage 4 chronic kidney disease, or unspecified chronic kidney disease: Secondary | ICD-10-CM | POA: Diagnosis not present

## 2020-09-08 DIAGNOSIS — D6869 Other thrombophilia: Secondary | ICD-10-CM | POA: Diagnosis not present

## 2020-09-08 DIAGNOSIS — N184 Chronic kidney disease, stage 4 (severe): Secondary | ICD-10-CM | POA: Diagnosis not present

## 2020-09-08 DIAGNOSIS — I10 Essential (primary) hypertension: Secondary | ICD-10-CM | POA: Diagnosis not present

## 2020-09-08 DIAGNOSIS — E119 Type 2 diabetes mellitus without complications: Secondary | ICD-10-CM | POA: Diagnosis not present

## 2020-09-08 DIAGNOSIS — Z9181 History of falling: Secondary | ICD-10-CM | POA: Diagnosis not present

## 2020-09-08 DIAGNOSIS — R269 Unspecified abnormalities of gait and mobility: Secondary | ICD-10-CM | POA: Diagnosis not present

## 2020-09-08 NOTE — Progress Notes (Signed)
[facility] Summary Report / Loop Recorder 

## 2020-09-13 DIAGNOSIS — D6869 Other thrombophilia: Secondary | ICD-10-CM | POA: Diagnosis not present

## 2020-09-13 DIAGNOSIS — M6281 Muscle weakness (generalized): Secondary | ICD-10-CM | POA: Diagnosis not present

## 2020-09-13 DIAGNOSIS — I129 Hypertensive chronic kidney disease with stage 1 through stage 4 chronic kidney disease, or unspecified chronic kidney disease: Secondary | ICD-10-CM | POA: Diagnosis not present

## 2020-09-13 DIAGNOSIS — E119 Type 2 diabetes mellitus without complications: Secondary | ICD-10-CM | POA: Diagnosis not present

## 2020-09-13 DIAGNOSIS — I480 Paroxysmal atrial fibrillation: Secondary | ICD-10-CM | POA: Diagnosis not present

## 2020-09-13 DIAGNOSIS — I10 Essential (primary) hypertension: Secondary | ICD-10-CM | POA: Diagnosis not present

## 2020-09-13 DIAGNOSIS — R269 Unspecified abnormalities of gait and mobility: Secondary | ICD-10-CM | POA: Diagnosis not present

## 2020-09-13 DIAGNOSIS — Z9181 History of falling: Secondary | ICD-10-CM | POA: Diagnosis not present

## 2020-09-13 DIAGNOSIS — N184 Chronic kidney disease, stage 4 (severe): Secondary | ICD-10-CM | POA: Diagnosis not present

## 2020-09-15 DIAGNOSIS — Z9181 History of falling: Secondary | ICD-10-CM | POA: Diagnosis not present

## 2020-09-15 DIAGNOSIS — I10 Essential (primary) hypertension: Secondary | ICD-10-CM | POA: Diagnosis not present

## 2020-09-15 DIAGNOSIS — N184 Chronic kidney disease, stage 4 (severe): Secondary | ICD-10-CM | POA: Diagnosis not present

## 2020-09-15 DIAGNOSIS — M6281 Muscle weakness (generalized): Secondary | ICD-10-CM | POA: Diagnosis not present

## 2020-09-15 DIAGNOSIS — I129 Hypertensive chronic kidney disease with stage 1 through stage 4 chronic kidney disease, or unspecified chronic kidney disease: Secondary | ICD-10-CM | POA: Diagnosis not present

## 2020-09-15 DIAGNOSIS — E119 Type 2 diabetes mellitus without complications: Secondary | ICD-10-CM | POA: Diagnosis not present

## 2020-09-15 DIAGNOSIS — I480 Paroxysmal atrial fibrillation: Secondary | ICD-10-CM | POA: Diagnosis not present

## 2020-09-15 DIAGNOSIS — D6869 Other thrombophilia: Secondary | ICD-10-CM | POA: Diagnosis not present

## 2020-09-15 DIAGNOSIS — R269 Unspecified abnormalities of gait and mobility: Secondary | ICD-10-CM | POA: Diagnosis not present

## 2020-09-21 LAB — CUP PACEART REMOTE DEVICE CHECK
Date Time Interrogation Session: 20220719233322
Implantable Pulse Generator Implant Date: 20190614

## 2020-09-25 ENCOUNTER — Ambulatory Visit (INDEPENDENT_AMBULATORY_CARE_PROVIDER_SITE_OTHER): Payer: Medicare Other

## 2020-09-25 DIAGNOSIS — I480 Paroxysmal atrial fibrillation: Secondary | ICD-10-CM

## 2020-10-19 NOTE — Progress Notes (Signed)
[facility] Summary Report / Loop Recorder 

## 2020-10-30 ENCOUNTER — Ambulatory Visit (INDEPENDENT_AMBULATORY_CARE_PROVIDER_SITE_OTHER): Payer: Medicare Other

## 2020-10-30 DIAGNOSIS — I480 Paroxysmal atrial fibrillation: Secondary | ICD-10-CM | POA: Diagnosis not present

## 2020-10-31 LAB — CUP PACEART REMOTE DEVICE CHECK
Date Time Interrogation Session: 20220822000804
Implantable Pulse Generator Implant Date: 20190614

## 2020-11-10 NOTE — Progress Notes (Signed)
[facility] Summary Report / Loop Recorder 

## 2020-11-13 DIAGNOSIS — D6869 Other thrombophilia: Secondary | ICD-10-CM | POA: Diagnosis not present

## 2020-11-13 DIAGNOSIS — E1121 Type 2 diabetes mellitus with diabetic nephropathy: Secondary | ICD-10-CM | POA: Diagnosis not present

## 2020-11-13 DIAGNOSIS — I480 Paroxysmal atrial fibrillation: Secondary | ICD-10-CM | POA: Diagnosis not present

## 2020-11-13 DIAGNOSIS — I129 Hypertensive chronic kidney disease with stage 1 through stage 4 chronic kidney disease, or unspecified chronic kidney disease: Secondary | ICD-10-CM | POA: Diagnosis not present

## 2020-11-16 DIAGNOSIS — D631 Anemia in chronic kidney disease: Secondary | ICD-10-CM | POA: Diagnosis not present

## 2020-11-16 DIAGNOSIS — E1122 Type 2 diabetes mellitus with diabetic chronic kidney disease: Secondary | ICD-10-CM | POA: Diagnosis not present

## 2020-11-16 DIAGNOSIS — N183 Chronic kidney disease, stage 3 unspecified: Secondary | ICD-10-CM | POA: Diagnosis not present

## 2020-11-16 DIAGNOSIS — I129 Hypertensive chronic kidney disease with stage 1 through stage 4 chronic kidney disease, or unspecified chronic kidney disease: Secondary | ICD-10-CM | POA: Diagnosis not present

## 2020-11-30 ENCOUNTER — Other Ambulatory Visit: Payer: Self-pay

## 2020-11-30 ENCOUNTER — Ambulatory Visit (INDEPENDENT_AMBULATORY_CARE_PROVIDER_SITE_OTHER): Payer: Medicare Other | Admitting: Sports Medicine

## 2020-11-30 ENCOUNTER — Encounter: Payer: Self-pay | Admitting: Sports Medicine

## 2020-11-30 DIAGNOSIS — I739 Peripheral vascular disease, unspecified: Secondary | ICD-10-CM

## 2020-11-30 DIAGNOSIS — M79675 Pain in left toe(s): Secondary | ICD-10-CM

## 2020-11-30 DIAGNOSIS — B351 Tinea unguium: Secondary | ICD-10-CM

## 2020-11-30 DIAGNOSIS — E119 Type 2 diabetes mellitus without complications: Secondary | ICD-10-CM

## 2020-11-30 DIAGNOSIS — M79674 Pain in right toe(s): Secondary | ICD-10-CM | POA: Diagnosis not present

## 2020-11-30 NOTE — Progress Notes (Signed)
Subjective: Sabrina [name] is a 87 y.o. female patient with history of diabetes who presents to office today complaining of long,mildly painful nails while in shoes; unable to trim. No other pedal complaints noted.   Patient is assisted by facility aide again this visit.   Patient Active Problem List   Diagnosis Date Noted   Abnormal gait 05/18/2020   Body mass index (BMI) 35.0-35.9, adult 05/18/2020   Cardiac pacemaker in situ 05/18/2020   Diabetic nephropathy ([place]) 05/18/2020   IgM monoclonal gammopathy of uncertain significance 05/18/2020   Malignant hypertensive chronic kidney disease 05/18/2020   Morbid obesity ([place]) 05/18/2020   Thrombophilia ([place]) 05/18/2020   Secondary hypercoagulable state ([place]) 08/16/2019   Osteoporosis 05/04/2019   Hypercalcemia 11/04/2018   History of stroke 11/04/2018   Paroxysmal atrial fibrillation ([place]) 11/04/2018   Acute kidney injury ([place])    Acute metabolic encephalopathy 14/78/2956   Personal history of DVT and pulm embolus 10/26/2018   Personal history of other venous thrombosis and embolism 10/26/2018   UTI (urinary tract infection) 10/24/2018   Clavicle fracture 09/26/2018   Degeneration of lumbar intervertebral disc 10/01/2017   Blurry vision, bilateral    Acute blood loss anemia    History of breast cancer 05/30/2017   Physical debility 05/30/2017   Occipital infarction ([place]) 05/30/2017   Pressure injury of skin 05/28/2017   Fall at home 05/27/2017   CKD (chronic kidney disease), stage IV ([place]) 05/27/2017   High cholesterol 05/27/2017   DM (diabetes mellitus) ([place]) 05/27/2017   Hypertension 05/27/2017   Gout 05/27/2017   Elevated troponin 05/27/2017   Lumbar radiculopathy 05/15/2017   Pain of right calf 04/30/2017   Asymmetrical left sensorineural hearing loss 02/14/2016   Impacted cerumen of left ear 02/14/2016   Lipoma of lower extremity 09/12/2014   Abnormal x-ray 03/15/2014   Chronic diastolic congestive heart failure  ([place]) 02/23/2014   Candidiasis of skin 01/13/2014   Osteopenia 11/30/2013   Hypoglycemia 09/13/2013   Acute respiratory failure with hypoxia ([place]) 06/14/2013   History of pulmonary embolism 06/10/2013   Nausea and vomiting 06/10/2013   Accelerated hypertension 06/10/2013   Candidiasis of vagina 05/19/2013   Aortic stenosis 04/22/2013   Edema leg 04/22/2013   Breast cancer of upper-outer quadrant of right female breast ([place]); in remission 03/19/2013   Current Outpatient Medications on File Prior to Visit  Medication Sig Dispense Refill   albuterol (VENTOLIN HFA) 108 (90 Base) MCG/ACT inhaler Inhale 1-2 puffs into the lungs every 6 (six) hours as needed for wheezing or shortness of breath.     allopurinol (ZYLOPRIM) 100 MG tablet Take 100 mg by mouth daily.      amLODipine (NORVASC) 2.5 MG tablet Take 2.5 mg by mouth daily.     cholecalciferol (VITAMIN D) 1000 UNITS tablet Take 1,000 Units by mouth daily.     cloNIDine (CATAPRES) 0.2 MG tablet Take 0.2 mg by mouth 2 (two) times daily.     COVID-19 mRNA vaccine, Pfizer, 30 MCG/0.3ML injection Inject into the muscle. 0.3 mL 0   denosumab (PROLIA) 60 MG/ML SOSY injection Inject 60 mg into the skin every 6 (six) months.     ELIQUIS 2.5 MG TABS tablet TAKE 1 TABLET BY MOUTH TWICE DAILY. 60 tablet 11   famotidine (PEPCID) 20 MG tablet Take 20 mg by mouth daily.     furosemide (LASIX) 40 MG tablet Take 40 mg by mouth daily.     gabapentin (NEURONTIN) 100 MG capsule Take 3 capsules (300 mg  total) by mouth at bedtime. Take 200 mg by mouth twice a day and take 300 mg by mouth at bedtime     glipiZIDE (GLUCOTROL) 5 MG tablet Take 5 mg by mouth 2 (two) times daily before a meal.      hydrALAZINE (APRESOLINE) 10 MG tablet Take 1 tablet (10 mg total) by mouth 3 (three) times daily. 90 tablet 2   hydrALAZINE (APRESOLINE) 50 MG tablet Take 50 mg by mouth 3 (three) times daily.     hydrocortisone cream 1 % Apply 1 application topically daily as needed for  itching.     polyethylene glycol (MIRALAX / GLYCOLAX) packet Take 17 g by mouth daily.     traMADol (ULTRAM) 50 MG tablet Take 50 mg by mouth 2 (two) times daily as needed for moderate pain.      TRUE METRIX BLOOD GLUCOSE TEST test strip      TRUEplus Lancets 28G MISC Apply topically.     No current facility-administered medications on file prior to visit.   Allergies  Allergen Reactions   Beta Adrenergic Blockers Other (See Comments)    Unknown    Objective: General: Patient is awake, alert, and oriented x 3 and in no acute distress.  Integument: Skin is warm, dry and supple bilateral. Nails are tender, long, thickened and dystrophic with subungual debris, consistent with onychomycosis, 1-5 bilateral. No signs of infection. No open lesions or preulcerative lesions present bilateral. Remaining integument unremarkable.  Vasculature:  Dorsalis Pedis pulse 1/4 bilateral. Posterior Tibial pulse  0/4 bilateral. Capillary fill time <5 sec 1-5 bilateral. Scant hair growth to the level of the digits.Temperature gradient within normal limits. No varicosities present bilateral.  1+ pitting edema present bilateral.   Neurology: Sensation present via light touch bilateral.   Musculoskeletal:Asymptomatic bunion and hammertoe pedal deformities noted bilateral. Muscular strength 5/5 in all lower extremity muscular groups bilateral without pain on range of motion. No tenderness with calf compression bilateral.   Assessment and Plan: Problem List Items Addressed This Visit   None Visit Diagnoses     Pain due to onychomycosis of toenails of both feet    -  Primary   PVD (peripheral vascular disease) ([place])       Diabetes mellitus without complication ([place])          -Examined patient. -Re-Discussed and educated patient on diabetic foot care -Mechanically debrided all nails 1-5 bilateral using sterile nail nipper and filed with dremel without incident -Continue with elevation for edema control   -Patient to return  in 3 months for at risk foot care -Patient advised to call the office if any problems or questions arise in the meantime.  Landis Martins, DPM

## 2020-12-04 ENCOUNTER — Ambulatory Visit (INDEPENDENT_AMBULATORY_CARE_PROVIDER_SITE_OTHER): Payer: Medicare Other

## 2020-12-04 DIAGNOSIS — I480 Paroxysmal atrial fibrillation: Secondary | ICD-10-CM | POA: Diagnosis not present

## 2020-12-04 LAB — CUP PACEART REMOTE DEVICE CHECK
Date Time Interrogation Session: 20220924000738
Implantable Pulse Generator Implant Date: 20190614

## 2020-12-07 ENCOUNTER — Ambulatory Visit: Payer: Medicare Other | Attending: Internal Medicine

## 2020-12-07 ENCOUNTER — Other Ambulatory Visit (HOSPITAL_BASED_OUTPATIENT_CLINIC_OR_DEPARTMENT_OTHER): Payer: Self-pay

## 2020-12-07 DIAGNOSIS — Z23 Encounter for immunization: Secondary | ICD-10-CM

## 2020-12-07 MED ORDER — PFIZER COVID-19 VAC BIVALENT 30 MCG/0.3ML IM SUSP
INTRAMUSCULAR | 0 refills | Status: DC
  Filled 2020-12-07: qty 0.3, 1d supply, fill #0

## 2020-12-07 NOTE — Progress Notes (Signed)
   Covid-19 Vaccination Clinic  Name:  Sabrina [name]    MRN: [id number] DOB: 04-Sep-1926  12/07/2020  Sabrina [name] was observed post Covid-19 immunization for 15 minutes without incident. She was provided with Vaccine Information Sheet and instruction to access the V-Safe system.   Sabrina [name] was instructed to call 911 with any severe reactions post vaccine: Difficulty breathing  Swelling of face and throat  A fast heartbeat  A bad rash all over body  Dizziness and weakness

## 2020-12-11 NOTE — Progress Notes (Signed)
[facility] Summary Report / Loop Recorder 

## 2020-12-18 ENCOUNTER — Inpatient Hospital Stay (HOSPITAL_COMMUNITY)
Admission: EM | Admit: 2020-12-18 | Discharge: 2020-12-22 | DRG: 189 | Disposition: A | Discharge status: Home Health | Payer: Medicare Other | Attending: Internal Medicine | Admitting: Internal Medicine

## 2020-12-18 ENCOUNTER — Emergency Department (HOSPITAL_COMMUNITY): Payer: Medicare Other

## 2020-12-18 ENCOUNTER — Encounter (HOSPITAL_COMMUNITY): Payer: Self-pay

## 2020-12-18 ENCOUNTER — Observation Stay (HOSPITAL_COMMUNITY): Payer: Medicare Other

## 2020-12-18 ENCOUNTER — Other Ambulatory Visit: Payer: Self-pay

## 2020-12-18 ENCOUNTER — Encounter: Payer: Self-pay | Admitting: Oncology

## 2020-12-18 DIAGNOSIS — R0602 Shortness of breath: Secondary | ICD-10-CM | POA: Diagnosis not present

## 2020-12-18 DIAGNOSIS — R0600 Dyspnea, unspecified: Secondary | ICD-10-CM | POA: Diagnosis present

## 2020-12-18 DIAGNOSIS — Z8249 Family history of ischemic heart disease and other diseases of the circulatory system: Secondary | ICD-10-CM

## 2020-12-18 DIAGNOSIS — Z79899 Other long term (current) drug therapy: Secondary | ICD-10-CM | POA: Diagnosis not present

## 2020-12-18 DIAGNOSIS — Z86711 Personal history of pulmonary embolism: Secondary | ICD-10-CM | POA: Diagnosis not present

## 2020-12-18 DIAGNOSIS — Z8673 Personal history of transient ischemic attack (TIA), and cerebral infarction without residual deficits: Secondary | ICD-10-CM

## 2020-12-18 DIAGNOSIS — N179 Acute kidney failure, unspecified: Secondary | ICD-10-CM | POA: Diagnosis present

## 2020-12-18 DIAGNOSIS — E119 Type 2 diabetes mellitus without complications: Secondary | ICD-10-CM

## 2020-12-18 DIAGNOSIS — R9431 Abnormal electrocardiogram [ECG] [EKG]: Secondary | ICD-10-CM

## 2020-12-18 DIAGNOSIS — I272 Pulmonary hypertension, unspecified: Secondary | ICD-10-CM | POA: Diagnosis present

## 2020-12-18 DIAGNOSIS — Z923 Personal history of irradiation: Secondary | ICD-10-CM

## 2020-12-18 DIAGNOSIS — I959 Hypotension, unspecified: Secondary | ICD-10-CM | POA: Diagnosis not present

## 2020-12-18 DIAGNOSIS — Z7901 Long term (current) use of anticoagulants: Secondary | ICD-10-CM | POA: Diagnosis not present

## 2020-12-18 DIAGNOSIS — Z9221 Personal history of antineoplastic chemotherapy: Secondary | ICD-10-CM | POA: Diagnosis not present

## 2020-12-18 DIAGNOSIS — M810 Age-related osteoporosis without current pathological fracture: Secondary | ICD-10-CM | POA: Diagnosis present

## 2020-12-18 DIAGNOSIS — E1122 Type 2 diabetes mellitus with diabetic chronic kidney disease: Secondary | ICD-10-CM

## 2020-12-18 DIAGNOSIS — R296 Repeated falls: Secondary | ICD-10-CM | POA: Diagnosis present

## 2020-12-18 DIAGNOSIS — R062 Wheezing: Secondary | ICD-10-CM | POA: Diagnosis not present

## 2020-12-18 DIAGNOSIS — J441 Chronic obstructive pulmonary disease with (acute) exacerbation: Secondary | ICD-10-CM | POA: Diagnosis not present

## 2020-12-18 DIAGNOSIS — Z8041 Family history of malignant neoplasm of ovary: Secondary | ICD-10-CM

## 2020-12-18 DIAGNOSIS — N184 Chronic kidney disease, stage 4 (severe): Secondary | ICD-10-CM | POA: Diagnosis not present

## 2020-12-18 DIAGNOSIS — I480 Paroxysmal atrial fibrillation: Secondary | ICD-10-CM | POA: Diagnosis not present

## 2020-12-18 DIAGNOSIS — I10 Essential (primary) hypertension: Secondary | ICD-10-CM | POA: Diagnosis present

## 2020-12-18 DIAGNOSIS — Z7984 Long term (current) use of oral hypoglycemic drugs: Secondary | ICD-10-CM

## 2020-12-18 DIAGNOSIS — Z85828 Personal history of other malignant neoplasm of skin: Secondary | ICD-10-CM | POA: Diagnosis not present

## 2020-12-18 DIAGNOSIS — R55 Syncope and collapse: Secondary | ICD-10-CM | POA: Diagnosis not present

## 2020-12-18 DIAGNOSIS — J9601 Acute respiratory failure with hypoxia: Principal | ICD-10-CM | POA: Diagnosis present

## 2020-12-18 DIAGNOSIS — I130 Hypertensive heart and chronic kidney disease with heart failure and stage 1 through stage 4 chronic kidney disease, or unspecified chronic kidney disease: Secondary | ICD-10-CM | POA: Diagnosis present

## 2020-12-18 DIAGNOSIS — D539 Nutritional anemia, unspecified: Secondary | ICD-10-CM | POA: Diagnosis not present

## 2020-12-18 DIAGNOSIS — Z853 Personal history of malignant neoplasm of breast: Secondary | ICD-10-CM | POA: Diagnosis not present

## 2020-12-18 DIAGNOSIS — J209 Acute bronchitis, unspecified: Secondary | ICD-10-CM | POA: Diagnosis not present

## 2020-12-18 DIAGNOSIS — E1121 Type 2 diabetes mellitus with diabetic nephropathy: Secondary | ICD-10-CM

## 2020-12-18 DIAGNOSIS — Z803 Family history of malignant neoplasm of breast: Secondary | ICD-10-CM

## 2020-12-18 DIAGNOSIS — Z20822 Contact with and (suspected) exposure to covid-19: Secondary | ICD-10-CM | POA: Diagnosis not present

## 2020-12-18 DIAGNOSIS — E1159 Type 2 diabetes mellitus with other circulatory complications: Secondary | ICD-10-CM | POA: Diagnosis present

## 2020-12-18 DIAGNOSIS — I700 Atherosclerosis of aorta: Secondary | ICD-10-CM | POA: Diagnosis not present

## 2020-12-18 DIAGNOSIS — R0902 Hypoxemia: Secondary | ICD-10-CM | POA: Diagnosis not present

## 2020-12-18 DIAGNOSIS — Z66 Do not resuscitate: Secondary | ICD-10-CM | POA: Diagnosis not present

## 2020-12-18 DIAGNOSIS — E0822 Diabetes mellitus due to underlying condition with diabetic chronic kidney disease: Secondary | ICD-10-CM | POA: Diagnosis not present

## 2020-12-18 DIAGNOSIS — Z86718 Personal history of other venous thrombosis and embolism: Secondary | ICD-10-CM

## 2020-12-18 DIAGNOSIS — I5032 Chronic diastolic (congestive) heart failure: Secondary | ICD-10-CM | POA: Diagnosis not present

## 2020-12-18 DIAGNOSIS — J9690 Respiratory failure, unspecified, unspecified whether with hypoxia or hypercapnia: Secondary | ICD-10-CM | POA: Diagnosis not present

## 2020-12-18 DIAGNOSIS — M109 Gout, unspecified: Secondary | ICD-10-CM | POA: Diagnosis present

## 2020-12-18 LAB — CBC WITH DIFFERENTIAL/PLATELET
Abs Immature Granulocytes: 0.01 10*3/uL (ref 0.00–0.07)
Basophils Absolute: 0.1 10*3/uL (ref 0.0–0.1)
Basophils Relative: 1 %
Eosinophils Absolute: 0.6 10*3/uL — ABNORMAL HIGH (ref 0.0–0.5)
Eosinophils Relative: 6 %
HCT: 32.2 % — ABNORMAL LOW (ref 36.0–46.0)
Hemoglobin: 10.2 g/dL — ABNORMAL LOW (ref 12.0–15.0)
Immature Granulocytes: 0 %
Lymphocytes Relative: 36 %
Lymphs Abs: 3.2 10*3/uL (ref 0.7–4.0)
MCH: 32 pg (ref 26.0–34.0)
MCHC: 31.7 g/dL (ref 30.0–36.0)
MCV: 100.9 fL — ABNORMAL HIGH (ref 80.0–100.0)
Monocytes Absolute: 0.7 10*3/uL (ref 0.1–1.0)
Monocytes Relative: 8 %
Neutro Abs: 4.4 10*3/uL (ref 1.7–7.7)
Neutrophils Relative %: 49 %
Platelets: 198 10*3/uL (ref 150–400)
RBC: 3.19 MIL/uL — ABNORMAL LOW (ref 3.87–5.11)
RDW: 14.6 % (ref 11.5–15.5)
WBC: 8.9 10*3/uL (ref 4.0–10.5)
nRBC: 0 % (ref 0.0–0.2)

## 2020-12-18 LAB — RESP PANEL BY RT-PCR (FLU A&B, COVID) ARPGX2
Influenza A by PCR: NEGATIVE
Influenza B by PCR: NEGATIVE
SARS Coronavirus 2 by RT PCR: NEGATIVE

## 2020-12-18 LAB — COMPREHENSIVE METABOLIC PANEL
ALT: 13 U/L (ref 0–44)
AST: 19 U/L (ref 15–41)
Albumin: 4 g/dL (ref 3.5–5.0)
Alkaline Phosphatase: 73 U/L (ref 38–126)
Anion gap: 14 (ref 5–15)
BUN: 99 mg/dL — ABNORMAL HIGH (ref 8–23)
CO2: 25 mmol/L (ref 22–32)
Calcium: 10.2 mg/dL (ref 8.9–10.3)
Chloride: 101 mmol/L (ref 98–111)
Creatinine, Ser: 2.3 mg/dL — ABNORMAL HIGH (ref 0.44–1.00)
GFR, Estimated: 20 mL/min — ABNORMAL LOW (ref >60)
Glucose, Bld: 191 mg/dL — ABNORMAL HIGH (ref 70–99)
Potassium: 3.9 mmol/L (ref 3.5–5.1)
Sodium: 140 mmol/L (ref 135–145)
Total Bilirubin: 1.1 mg/dL (ref 0.3–1.2)
Total Protein: 7.4 g/dL (ref 6.5–8.1)

## 2020-12-18 LAB — GLUCOSE, CAPILLARY
Glucose-Capillary: 181 mg/dL — ABNORMAL HIGH (ref 70–99)
Glucose-Capillary: 247 mg/dL — ABNORMAL HIGH (ref 70–99)

## 2020-12-18 LAB — TROPONIN I (HIGH SENSITIVITY)
Troponin I (High Sensitivity): 16 ng/L (ref <18)
Troponin I (High Sensitivity): 19 ng/L — ABNORMAL HIGH (ref <18)

## 2020-12-18 LAB — BRAIN NATRIURETIC PEPTIDE: B Natriuretic Peptide: 167 pg/mL — ABNORMAL HIGH (ref 0.0–100.0)

## 2020-12-18 LAB — PROCALCITONIN: Procalcitonin: <0.1 ng/mL

## 2020-12-18 LAB — HEMOGLOBIN A1C
Hgb A1c MFr Bld: 7.1 % — ABNORMAL HIGH (ref 4.8–5.6)
Mean Plasma Glucose: 157.07 mg/dL

## 2020-12-18 MED ORDER — LORAZEPAM 2 MG/ML IJ SOLN
0.5000 mg | Freq: Three times a day (TID) | INTRAMUSCULAR | Status: DC | PRN
  Administered 2020-12-18: 0.5 mg via INTRAVENOUS
  Filled 2020-12-18: qty 1

## 2020-12-18 MED ORDER — METHYLPREDNISOLONE SODIUM SUCC 125 MG IJ SOLR
60.0000 mg | Freq: Two times a day (BID) | INTRAMUSCULAR | Status: AC
  Administered 2020-12-18 – 2020-12-19 (×2): 60 mg via INTRAVENOUS
  Filled 2020-12-18 (×2): qty 2

## 2020-12-18 MED ORDER — ONDANSETRON HCL 4 MG PO TABS: 4.0000 mg | ORAL_TABLET | Freq: Four times a day (QID) | ORAL | Status: DC | PRN

## 2020-12-18 MED ORDER — LEVALBUTEROL HCL 0.63 MG/3ML IN NEBU
0.6300 mg | INHALATION_SOLUTION | Freq: Four times a day (QID) | RESPIRATORY_TRACT | Status: DC | PRN
  Administered 2020-12-18 – 2020-12-19 (×2): 0.63 mg via RESPIRATORY_TRACT
  Filled 2020-12-18 (×2): qty 3

## 2020-12-18 MED ORDER — PREDNISONE 20 MG PO TABS: 40.0000 mg | ORAL_TABLET | Freq: Every day | ORAL | Status: DC

## 2020-12-18 MED ORDER — ACETAMINOPHEN 650 MG RE SUPP: 650.0000 mg | Freq: Four times a day (QID) | RECTAL | Status: DC | PRN

## 2020-12-18 MED ORDER — SODIUM CHLORIDE 0.9 % IV SOLN: Freq: Once | INTRAVENOUS | Status: DC

## 2020-12-18 MED ORDER — INSULIN ASPART 100 UNIT/ML IJ SOLN: 0.0000 [IU] | Freq: Three times a day (TID) | INTRAMUSCULAR | Status: DC

## 2020-12-18 MED ORDER — ONDANSETRON HCL 4 MG/2ML IJ SOLN
4.0000 mg | Freq: Four times a day (QID) | INTRAMUSCULAR | Status: DC | PRN
  Administered 2020-12-20 (×2): 4 mg via INTRAVENOUS
  Filled 2020-12-18 (×2): qty 2

## 2020-12-18 MED ORDER — ALBUTEROL SULFATE (2.5 MG/3ML) 0.083% IN NEBU
2.5000 mg | INHALATION_SOLUTION | Freq: Once | RESPIRATORY_TRACT | Status: AC
  Administered 2020-12-18: 2.5 mg via RESPIRATORY_TRACT
  Filled 2020-12-18: qty 3

## 2020-12-18 MED ORDER — CLONIDINE HCL 0.2 MG PO TABS
0.2000 mg | ORAL_TABLET | Freq: Two times a day (BID) | ORAL | Status: DC
  Administered 2020-12-18 – 2020-12-19 (×2): 0.2 mg via ORAL
  Filled 2020-12-18 (×2): qty 1

## 2020-12-18 MED ORDER — GUAIFENESIN ER 600 MG PO TB12
600.0000 mg | ORAL_TABLET | Freq: Two times a day (BID) | ORAL | Status: DC
  Administered 2020-12-19 – 2020-12-22 (×7): 600 mg via ORAL
  Filled 2020-12-18 (×7): qty 1

## 2020-12-18 MED ORDER — ACETAMINOPHEN 325 MG PO TABS: 650.0000 mg | ORAL_TABLET | Freq: Four times a day (QID) | ORAL | Status: DC | PRN

## 2020-12-18 MED ORDER — INSULIN ASPART 100 UNIT/ML IJ SOLN
0.0000 [IU] | Freq: Three times a day (TID) | INTRAMUSCULAR | Status: DC
  Administered 2020-12-18: 1 [IU] via SUBCUTANEOUS
  Administered 2020-12-19 (×2): 2 [IU] via SUBCUTANEOUS
  Administered 2020-12-19: 3 [IU] via SUBCUTANEOUS

## 2020-12-18 MED ORDER — ALBUTEROL SULFATE HFA 108 (90 BASE) MCG/ACT IN AERS
6.0000 | INHALATION_SPRAY | Freq: Once | RESPIRATORY_TRACT | Status: AC
  Administered 2020-12-18: 6 via RESPIRATORY_TRACT
  Filled 2020-12-18: qty 6.7

## 2020-12-18 NOTE — ED Triage Notes (Signed)
Arrived via EMS from home. SOB since Friday. Per EMS, Home health nurse reported that they heard wheezing with ronchi. Hx pneumonia. Current PE per home health nurse. No complaint of chest pain. Hasn't taken BP meds today.

## 2020-12-18 NOTE — ED Provider Notes (Signed)
[facility] DEPT Provider Note   CSN: [id number] Arrival date & time: 12/18/20  1223     History Chief Complaint  Patient presents with   Shortness of Breath    Sabrina [name] is a 87 y.o. female.  The history is provided by the patient.  Shortness of Breath Severity:  Mild Onset quality:  Gradual Duration:  4 days Timing:  Constant Progression:  Unchanged Chronicity:  New Context comment:  Cough, denies chest pain Relieved by:  Nothing Worsened by:  Nothing Associated symptoms: cough   Associated symptoms: no abdominal pain, no chest pain, no ear pain, no fever, no rash, no sore throat and no vomiting   Risk factors: hx of PE/DVT       Past Medical History:  Diagnosis Date   Arthritis    "mostly in my hands, lower back" (06/25/2017)   Basal cell carcinoma (BCC) of face 1983   Breast cancer, right breast ([place]) 03/2013   Chronic kidney disease (CKD), stage III (moderate) ([place])    nephrologist, Dr. [name]   Dental crowns present    DVT (deep venous thrombosis) ([place]) ~ 06/2013   "? side"   Gout    "on daily RX" (06/25/2017)   Heart murmur    no known problems; states did not know she had murmur until age 27   High cholesterol    Hypertension    fluctuates, especially when stressed; has been on med. > 20 yr.   Immature cataract    Non-insulin dependent type 2 diabetes mellitus ([place])    Personal history of chemotherapy    Personal history of radiation therapy    Pulmonary embolism ([place]) ~ 06/2013   Radiation 09/06/13-10/20/13   Right Breast Cancer   Stroke ([place]) 05/2017   just visual problems since (06/25/2017)   Wears partial dentures    lower    Patient Active Problem List   Diagnosis Date Noted   Dyspnea 12/18/2020   Abnormal gait 05/18/2020   Body mass index (BMI) 35.0-35.9, adult 05/18/2020   Cardiac pacemaker in situ 05/18/2020   Diabetic nephropathy ([place]) 05/18/2020   IgM monoclonal gammopathy of uncertain  significance 05/18/2020   Malignant hypertensive chronic kidney disease 05/18/2020   Morbid obesity ([place]) 05/18/2020   Thrombophilia ([place]) 05/18/2020   Secondary hypercoagulable state ([place]) 08/16/2019   Osteoporosis 05/04/2019   Hypercalcemia 11/04/2018   History of stroke 11/04/2018   Paroxysmal atrial fibrillation ([place]) 11/04/2018   Acute kidney injury ([place])    Acute metabolic encephalopathy 56/21/3086   Personal history of DVT and pulm embolus 10/26/2018   Personal history of other venous thrombosis and embolism 10/26/2018   UTI (urinary tract infection) 10/24/2018   Clavicle fracture 09/26/2018   Degeneration of lumbar intervertebral disc 10/01/2017   Blurry vision, bilateral    Acute blood loss anemia    History of breast cancer 05/30/2017   Physical debility 05/30/2017   Occipital infarction ([place]) 05/30/2017   Pressure injury of skin 05/28/2017   Fall at home 05/27/2017   CKD (chronic kidney disease), stage IV ([place]) 05/27/2017   High cholesterol 05/27/2017   DM (diabetes mellitus) ([place]) 05/27/2017   Hypertension 05/27/2017   Gout 05/27/2017   Elevated troponin 05/27/2017   Lumbar radiculopathy 05/15/2017   Pain of right calf 04/30/2017   Asymmetrical left sensorineural hearing loss 02/14/2016   Impacted cerumen of left ear 02/14/2016   Lipoma of lower extremity 09/12/2014   Abnormal x-ray 03/15/2014   Chronic diastolic congestive  heart failure ([place]) 02/23/2014   Candidiasis of skin 01/13/2014   Osteopenia 11/30/2013   Hypoglycemia 09/13/2013   Acute respiratory failure with hypoxia ([place]) 06/14/2013   History of pulmonary embolism 06/10/2013   Nausea and vomiting 06/10/2013   Accelerated hypertension 06/10/2013   Candidiasis of vagina 05/19/2013   Aortic stenosis 04/22/2013   Edema leg 04/22/2013   Breast cancer of upper-outer quadrant of right female breast ([place]); in remission 03/19/2013    Past Surgical History:  Procedure Laterality Date   AXILLARY LYMPH  NODE DISSECTION Right 03/30/2013   Procedure: AXILLARY LYMPH NODE DISSECTION;  Surgeon: Rolm Bookbinder, MD;  Location: [facility];  Service: General;  Laterality: Right;   BASAL CELL CARCINOMA EXCISION  1983   "face"   BREAST BIOPSY Right 03/17/2013   BREAST CYST EXCISION Right 11/1958   benign   BREAST LUMPECTOMY Right 03/30/2013   BREAST LUMPECTOMY WITH NEEDLE LOCALIZATION Right 03/30/2013   Procedure: BREAST LUMPECTOMY WITH NEEDLE LOCALIZATION;  Surgeon: Rolm Bookbinder, MD;  Location: [facility];  Service: General;  Laterality: Right;   DILATION AND CURETTAGE OF UTERUS     LOOP RECORDER INSERTION N/A 08/15/2017   Procedure: LOOP RECORDER INSERTION;  Surgeon: Evans Lance, MD;  Location: [facility];  Service: Cardiovascular;  Laterality: N/A;   PORT-A-CATH REMOVAL  2016   PORTACATH PLACEMENT N/A 04/15/2013   Procedure: INSERTION PORT-A-CATH;  Surgeon: Rolm Bookbinder, MD;  Location: [facility];  Service: General;  Laterality: N/A;   RE-EXCISION OF BREAST CANCER,SUPERIOR MARGINS Right 04/15/2013   Procedure: RE-EXCISION OF RIGHT BREAST  MARGINS;  Surgeon: Rolm Bookbinder, MD;  Location: [facility];  Service: General;  Laterality: Right;   TONSILLECTOMY  ~ 1935/1936     OB History   No obstetric history on file.    Obstetric Comments  Menarche ae 14 No children Married 26 years         Family History  Problem Relation Age of Onset   Pneumonia Mother    Heart attack Father    Breast cancer Other 40       niece   Breast cancer Other 1       niece   Ovarian cancer Other        niece    Social History   Tobacco Use   Smoking status: Never   Smokeless tobacco: Never   Tobacco comments:    only smoked 2 packs cigarettes total; husband quit in 1971  Vaping Use   Vaping Use: Never used  Substance Use Topics   Alcohol use: Not Currently    Alcohol/week: 3.0 standard drinks    Types: 3 Glasses of wine per week   Drug use: No     Home Medications Prior to Admission medications   Medication Sig Start Date End Date Taking? Authorizing Provider  albuterol (VENTOLIN HFA) 108 (90 Base) MCG/ACT inhaler Inhale 1-2 puffs into the lungs every 6 (six) hours as needed for wheezing or shortness of breath. 03/22/19   [provider]  allopurinol (ZYLOPRIM) 100 MG tablet Take 100 mg by mouth daily.  01/17/16   [provider]  amLODipine (NORVASC) 2.5 MG tablet Take 2.5 mg by mouth daily. 04/11/20   [provider]  cholecalciferol (VITAMIN D) 1000 UNITS tablet Take 1,000 Units by mouth daily.    [provider]  cloNIDine (CATAPRES) 0.2 MG tablet Take 0.2 mg by mouth 2 (two) times daily.    [provider]  COVID-19  mRNA bivalent vaccine, Pfizer, (PFIZER COVID-19 VAC BIVALENT) injection Inject into the muscle. 12/07/20   Carlyle Basques, MD  COVID-19 mRNA vaccine, Pfizer, 30 MCG/0.3ML injection Inject into the muscle. 06/16/20   Carlyle Basques, MD  denosumab (PROLIA) 60 MG/ML SOSY injection Inject 60 mg into the skin every 6 (six) months.    [provider]  ELIQUIS 2.5 MG TABS tablet TAKE 1 TABLET BY MOUTH TWICE DAILY. 07/04/20   Fenton, Clint R, PA  famotidine (PEPCID) 20 MG tablet Take 20 mg by mouth daily. 11/09/19   [provider]  furosemide (LASIX) 40 MG tablet Take 40 mg by mouth daily.    [provider]  gabapentin (NEURONTIN) 100 MG capsule Take 3 capsules (300 mg total) by mouth at bedtime. Take 200 mg by mouth twice a day and take 300 mg by mouth at bedtime 11/06/18   Mariel Aloe, MD  glipiZIDE (GLUCOTROL) 5 MG tablet Take 5 mg by mouth 2 (two) times daily before a meal.     [provider]  hydrALAZINE (APRESOLINE) 10 MG tablet Take 1 tablet (10 mg total) by mouth 3 (three) times daily. 09/29/18 12/28/18  Hosie Poisson, MD  hydrALAZINE (APRESOLINE) 50 MG tablet Take 50 mg by mouth 3 (three) times daily.    [provider]   hydrocortisone cream 1 % Apply 1 application topically daily as needed for itching.    [provider]  polyethylene glycol (MIRALAX / GLYCOLAX) packet Take 17 g by mouth daily.    [provider]  traMADol (ULTRAM) 50 MG tablet Take 50 mg by mouth 2 (two) times daily as needed for moderate pain.     [provider]  TRUE METRIX BLOOD GLUCOSE TEST test strip  07/19/19   [provider]  TRUEplus Lancets 28G MISC Apply topically. 08/20/19   [provider]    Allergies    Beta adrenergic blockers  Review of Systems   Review of Systems  Constitutional:  Negative for chills and fever.  HENT:  Negative for ear pain and sore throat.   Eyes:  Negative for pain and visual disturbance.  Respiratory:  Positive for cough and shortness of breath.   Cardiovascular:  Negative for chest pain and palpitations.  Gastrointestinal:  Negative for abdominal pain and vomiting.  Genitourinary:  Negative for dysuria and hematuria.  Musculoskeletal:  Negative for arthralgias and back pain.  Skin:  Negative for color change and rash.  Neurological:  Negative for seizures and syncope.  All other systems reviewed and are negative.  Physical Exam Updated Vital Signs  ED Triage Vitals  Enc Vitals Group     BP 12/18/20 1242 (!) 172/68     Pulse Rate 12/18/20 1242 (!) 111     Resp 12/18/20 1242 (!) 25     Temp 12/18/20 1242 97.8 F (36.6 C)     Temp Source 12/18/20 1242 Oral     SpO2 12/18/20 1233 100 %     Weight 12/18/20 1248 172 lb (78 kg)     Height 12/18/20 1248 4\' 10"  (1.473 m)     Head Circumference --      Peak Flow --      Pain Score 12/18/20 1244 0     Pain Loc --      Pain Edu? --      Excl. in [place]? --      Physical Exam Vitals and nursing note reviewed.  Constitutional:      General:  She is not in acute distress.    Appearance: She is well-developed. She is not ill-appearing.  HENT:     Head: Normocephalic and atraumatic.  Eyes:      Conjunctiva/sclera: Conjunctivae normal.     Pupils: Pupils are equal, round, and reactive to light.  Cardiovascular:     Rate and Rhythm: Normal rate and regular rhythm.     Pulses: Normal pulses.     Heart sounds: Normal heart sounds. No murmur heard. Pulmonary:     Effort: No respiratory distress.     Breath sounds: Decreased breath sounds present.     Comments: Coarse breath sounds  Abdominal:     Palpations: Abdomen is soft.     Tenderness: There is no abdominal tenderness.  Musculoskeletal:        General: Normal range of motion.     Cervical back: Normal range of motion and neck supple.     Right lower leg: No edema.     Left lower leg: No edema.  Skin:    General: Skin is warm and dry.     Capillary Refill: Capillary refill takes less than 2 seconds.  Neurological:     General: No focal deficit present.     Mental Status: She is alert and oriented to person, place, and time.     Cranial Nerves: No cranial nerve deficit.     Motor: No weakness.  Psychiatric:        Mood and Affect: Mood normal.    ED Results / Procedures / Treatments   Labs (all labs ordered are listed, but only abnormal results are displayed) Labs Reviewed  CBC WITH DIFFERENTIAL/PLATELET - Abnormal; Notable for the following components:      Result Value   RBC 3.19 (*)    Hemoglobin 10.2 (*)    HCT 32.2 (*)    MCV 100.9 (*)    Eosinophils Absolute 0.6 (*)    All other components within normal limits  COMPREHENSIVE METABOLIC PANEL - Abnormal; Notable for the following components:   Glucose, Bld 191 (*)    BUN 99 (*)    Creatinine, Ser 2.30 (*)    GFR, Estimated 20 (*)    All other components within normal limits  BRAIN NATRIURETIC PEPTIDE - Abnormal; Notable for the following components:   B Natriuretic Peptide 167.0 (*)    All other components within normal limits  RESP PANEL BY RT-PCR (FLU A&B, COVID) ARPGX2  TROPONIN I (HIGH SENSITIVITY)  TROPONIN I (HIGH SENSITIVITY)    EKG EKG  Interpretation  Date/Time:  Monday December 18 2020 12:48:03 EDT Ventricular Rate:  113 PR Interval:  181 QRS Duration: 93 QT Interval:  332 QTC Calculation: 456 R Axis:   61 Text Interpretation: Sinus tachycardia Repol abnrm, severe global ischemia (LM/MVD) Confirmed by Lennice Sites ([id number]) on 12/18/2020 1:14:33 PM  Radiology DG Chest Portable 1 View  Result Date: 12/18/2020 CLINICAL DATA:  Shortness of breath. EXAM: PORTABLE CHEST 1 VIEW COMPARISON:  06/28/2020 and CT chest 03/22/2015. FINDINGS: Trachea is midline. Heart size stable. Loop recorder projects over the left chest. Lungs are clear. No pleural fluid. IMPRESSION: No acute findings. Electronically Signed   By: Lorin Picket M.D.   On: 12/18/2020 13:08    Procedures Procedures   Medications Ordered in ED Medications  albuterol (VENTOLIN HFA) 108 (90 Base) MCG/ACT inhaler 6 puff (6 puffs Inhalation Given 12/18/20 1438)    ED Course  I have reviewed the triage vital signs and the  nursing notes.  Pertinent labs & imaging results that were available during my care of the patient were reviewed by me and considered in my medical decision making (see chart for details).    MDM Rules/Calculators/A&P                           Lyna Marthann Abshier is a 87 year old female with history of hypertension, high cholesterol, PE on Eliquis who presents the ED with shortness of breath.  Patient mildly tachycardic but otherwise unremarkable vitals.  EKG shows sinus tachycardia with some ST depressions diffusely.  Not having any chest pain.  May be having worsening cough but no sputum production.  Denies any fevers or chills.  She is felt short of breath may be the last several days.  Her home health nurse sent her for evaluation.  She does take Eliquis for blood clot history.  Not sure if this is a silent ACS versus fluid overload versus infectious process.  Seems less likely to be PE given that she is on anticoagulation.  Will obtain lab  work including troponin and BNP and COVID testing.  She denies any COPD/asthma history.  She has coarse breath sounds on exams with may be some scattered wheezing.  Patient was having some drops of her oxygen into the 80s and was placed on 2 L of oxygen.  Will reevaluate pending lab work and imaging.  Patient with no obvious pneumonia.  Troponin within normal limits.  BNP unremarkable.  No significant anemia or electrolyte abnormality.  Overall seems stable on 2 L of oxygen.  Will give breathing treatment.  Not sure if this is a bronchitis or possibly silent ACS.  Cardiology has been consulted and will evaluate the patient likely tomorrow.  Suspect plan should be to get echocardiogram and serial troponins.  Low suspicion for PE as patient is on anticoagulation.  This chart was dictated using voice recognition software.  Despite best efforts to proofread,  errors can occur which can change the documentation meaning.   Final Clinical Impression(s) / ED Diagnoses Final diagnoses:  SOB (shortness of breath)  Acute respiratory failure with hypoxia [facility])    Rx / DC Orders ED Discharge Orders     None        Lennice Sites, DO 12/18/20 1506

## 2020-12-18 NOTE — Significant Event (Signed)
Rapid Response Event Note   Reason for Call :  Respiratory distress and increased wheezing  Initial Focused Assessment:   Patinet resting in bed breathing 18-20 times/minute on 2L [place]. Bilateral wheezing heard on aulscultation. Olena Heckle NP paged and notified. PRN albuterol administered. Patient did receive 0.5 mg Ativan for CT scan. Patient alert and oriented but drowsy.     Interventions:  PRN albuterol administered per rapid response protocol. Continuous pulse ox ordered  Plan of Care:  Keep on 6th floor and monitor. Continuous pulse ox ordered.    Event Summary:   MD Notified: 1905 Call Time: 1855 Arrival Time: 1900 End Time: 1935  Josph Macho, RN

## 2020-12-18 NOTE — Progress Notes (Signed)
Pt w/ increased wheezing and SOB. Rapid Response RN notified to assess pt. Pt placed on continuous pulse ox, Albuterol treatment given. Will continue to monitor.

## 2020-12-18 NOTE — ED Notes (Signed)
Caryl Asp ( niece) [phone]  Vito Backers ( care giver)  [phone]

## 2020-12-18 NOTE — H&P (Signed)
History and Physical    Sabrina [name] ACZ:[id number] DOB: March 19, 1926 DOA: 12/18/2020  PCP: [name], MD  Patient coming from: Home  Chief Complaint: Cough, wheeze  HPI: [facility] Sabrina [name] is a 87 y.o. female with medical history significant of  afib, DVT, CKD4, DM2. Presenting with increased cough, wheeze, dyspnea. Her symptoms started 2 days ago. She had a non-productive cough and some wheeze. Yesterday she noticed that she was becoming short of breath when walking to the restroom. It required her to take a break on her way back to her room. She didn't not try any extra medications to help. She just tried rest. This morning she found her symptoms worsening, so she came to the ED for assistance. She denies any other aggravating or alleviating factors.    ED Course: CXR was negative. BNP was minimally elevated. She had periodic desats to the 80s on RA. She was given albuterol and seemed to improve. It was noted on her EKG that she had diffuse ST depressions.  Review of Systems:  Denies CP, palpitations, lightheadedness, dizziness, N/V/D, fever. Review of systems is otherwise negative for all not mentioned in HPI.   PMHx Past Medical History:  Diagnosis Date   Arthritis    "mostly in my hands, lower back" (06/25/2017)   Basal cell carcinoma (BCC) of face 1983   Breast cancer, right breast ([place]) 03/2013   Chronic kidney disease (CKD), stage III (moderate) ([place])    nephrologist, Dr. [name]   Dental crowns present    DVT (deep venous thrombosis) ([place]) ~ 06/2013   "? side"   Gout    "on daily RX" (06/25/2017)   Heart murmur    no known problems; states did not know she had murmur until age 24   High cholesterol    Hypertension    fluctuates, especially when stressed; has been on med. > 20 yr.   Immature cataract    Non-insulin dependent type 2 diabetes mellitus ([place])    Personal history of chemotherapy    Personal history of radiation therapy    Pulmonary embolism  ([place]) ~ 06/2013   Radiation 09/06/13-10/20/13   Right Breast Cancer   Stroke ([place]) 05/2017   just visual problems since (06/25/2017)   Wears partial dentures    lower    PSHx Past Surgical History:  Procedure Laterality Date   AXILLARY LYMPH NODE DISSECTION Right 03/30/2013   Procedure: AXILLARY LYMPH NODE DISSECTION;  Surgeon: Rolm Bookbinder, MD;  Location: [facility];  Service: General;  Laterality: Right;   BASAL CELL CARCINOMA EXCISION  1983   "face"   BREAST BIOPSY Right 03/17/2013   BREAST CYST EXCISION Right 11/1958   benign   BREAST LUMPECTOMY Right 03/30/2013   BREAST LUMPECTOMY WITH NEEDLE LOCALIZATION Right 03/30/2013   Procedure: BREAST LUMPECTOMY WITH NEEDLE LOCALIZATION;  Surgeon: Rolm Bookbinder, MD;  Location: [facility];  Service: General;  Laterality: Right;   DILATION AND CURETTAGE OF UTERUS     LOOP RECORDER INSERTION N/A 08/15/2017   Procedure: LOOP RECORDER INSERTION;  Surgeon: Evans Lance, MD;  Location: [facility];  Service: Cardiovascular;  Laterality: N/A;   PORT-A-CATH REMOVAL  2016   PORTACATH PLACEMENT N/A 04/15/2013   Procedure: INSERTION PORT-A-CATH;  Surgeon: Rolm Bookbinder, MD;  Location: [facility];  Service: General;  Laterality: N/A;   RE-EXCISION OF BREAST CANCER,SUPERIOR MARGINS Right 04/15/2013   Procedure: RE-EXCISION OF RIGHT BREAST  MARGINS;  Surgeon: Rolm Bookbinder, MD;  Location: [facility];  Service: General;  Laterality: Right;   TONSILLECTOMY  ~ 1935/1936    SocHx  reports that she has never smoked. She has never used smokeless tobacco. She reports that she does not currently use alcohol after a past usage of about 3.0 standard drinks per week. She reports that she does not use drugs.  Allergies  Allergen Reactions   Beta Adrenergic Blockers Other (See Comments)    Unknown    FamHx Family History  Problem Relation Age of Onset   Pneumonia Mother    Heart attack Father    Breast cancer Other  38       niece   Breast cancer Other 62       niece   Ovarian cancer Other        niece    Prior to Admission medications   Medication Sig Start Date End Date Taking? Authorizing Provider  albuterol (VENTOLIN HFA) 108 (90 Base) MCG/ACT inhaler Inhale 1-2 puffs into the lungs every 6 (six) hours as needed for wheezing or shortness of breath. 03/22/19   [provider]  allopurinol (ZYLOPRIM) 100 MG tablet Take 100 mg by mouth daily.  01/17/16   [provider]  amLODipine (NORVASC) 2.5 MG tablet Take 2.5 mg by mouth daily. 04/11/20   [provider]  cholecalciferol (VITAMIN D) 1000 UNITS tablet Take 1,000 Units by mouth daily.    [provider]  cloNIDine (CATAPRES) 0.2 MG tablet Take 0.2 mg by mouth 2 (two) times daily.    [provider]  COVID-19 mRNA bivalent vaccine, Pfizer, (PFIZER COVID-19 VAC BIVALENT) injection Inject into the muscle. 12/07/20   Carlyle Basques, MD  COVID-19 mRNA vaccine, Pfizer, 30 MCG/0.3ML injection Inject into the muscle. 06/16/20   Carlyle Basques, MD  denosumab (PROLIA) 60 MG/ML SOSY injection Inject 60 mg into the skin every 6 (six) months.    [provider]  ELIQUIS 2.5 MG TABS tablet TAKE 1 TABLET BY MOUTH TWICE DAILY. 07/04/20   Fenton, Clint R, PA  famotidine (PEPCID) 20 MG tablet Take 20 mg by mouth daily. 11/09/19   [provider]  furosemide (LASIX) 40 MG tablet Take 40 mg by mouth daily.    [provider]  gabapentin (NEURONTIN) 100 MG capsule Take 3 capsules (300 mg total) by mouth at bedtime. Take 200 mg by mouth twice a day and take 300 mg by mouth at bedtime 11/06/18   Mariel Aloe, MD  glipiZIDE (GLUCOTROL) 5 MG tablet Take 5 mg by mouth 2 (two) times daily before a meal.     [provider]  hydrALAZINE (APRESOLINE) 10 MG tablet Take 1 tablet (10 mg total) by mouth 3 (three) times daily. 09/29/18 12/28/18  Hosie Poisson, MD  hydrALAZINE (APRESOLINE) 50 MG tablet Take 50  mg by mouth 3 (three) times daily.    [provider]  hydrocortisone cream 1 % Apply 1 application topically daily as needed for itching.    [provider]  polyethylene glycol (MIRALAX / GLYCOLAX) packet Take 17 g by mouth daily.    [provider]  traMADol (ULTRAM) 50 MG tablet Take 50 mg by mouth 2 (two) times daily as needed for moderate pain.     [provider]  TRUE METRIX BLOOD GLUCOSE TEST test strip  07/19/19   [provider]  TRUEplus Lancets 28G MISC Apply topically. 08/20/19   [provider]    Physical Exam: Vitals:   12/18/20 1233  12/18/20 1242 12/18/20 1248 12/18/20 1400  BP:  (!) 172/68  (!) 164/55  Pulse:  (!) 111  (!) 110  Resp:  (!) 25  (!) 21  Temp:  97.8 F (36.6 C)    TempSrc:  Oral    SpO2: 100% 95%  94%  Weight:   78 kg   Height:   4\' 10"  (1.473 m)     General: 87 y.o. female resting in bed in NAD Eyes: PERRL, normal sclera ENMT: Nares patent w/o discharge, orophaynx clear, dentition normal, ears w/o discharge/lesions/ulcers Neck: Supple, trachea midline Cardiovascular: tachy, +S1, S2, no m/g/r, equal pulses throughout Respiratory: UAT, diffuse wheeze, no r/r, sightly increased WOB on 2L GI: BS+, NDNT, no masses noted, no organomegaly noted MSK: No c/c; trace b/l edema Neuro: A&O x 3, follows commands Psyc: Appropriate interaction and affect, calm/cooperative  Labs on Admission: I have personally reviewed following labs and imaging studies  CBC: Recent Labs  Lab 12/18/20 1230  WBC 8.9  NEUTROABS 4.4  HGB 10.2*  HCT 32.2*  MCV 100.9*  PLT 712   Basic Metabolic Panel: Recent Labs  Lab 12/18/20 1230  NA 140  K 3.9  CL 101  CO2 25  GLUCOSE 191*  BUN 99*  CREATININE 2.30*  CALCIUM 10.2   GFR: Estimated Creatinine Clearance: 14 mL/min (A) (by C-G formula based on SCr of 2.3 mg/dL (H)). Liver Function Tests: Recent Labs  Lab 12/18/20 1230  AST 19  ALT 13  ALKPHOS 73  BILITOT  1.1  PROT 7.4  ALBUMIN 4.0   No results for input(s): LIPASE, AMYLASE in the last 168 hours. No results for input(s): AMMONIA in the last 168 hours. Coagulation Profile: No results for input(s): INR, PROTIME in the last 168 hours. Cardiac Enzymes: No results for input(s): CKTOTAL, CKMB, CKMBINDEX, TROPONINI in the last 168 hours. BNP (last 3 results) No results for input(s): PROBNP in the last 8760 hours. HbA1C: No results for input(s): HGBA1C in the last 72 hours. CBG: No results for input(s): GLUCAP in the last 168 hours. Lipid Profile: No results for input(s): CHOL, HDL, LDLCALC, TRIG, CHOLHDL, LDLDIRECT in the last 72 hours. Thyroid Function Tests: No results for input(s): TSH, T4TOTAL, FREET4, T3FREE, THYROIDAB in the last 72 hours. Anemia Panel: No results for input(s): VITAMINB12, FOLATE, FERRITIN, TIBC, IRON, RETICCTPCT in the last 72 hours. Urine analysis:    Component Value Date/Time   COLORURINE YELLOW 08/02/2020 1814   APPEARANCEUR HAZY (A) 08/02/2020 1814   LABSPEC 1.009 08/02/2020 1814   PHURINE 7.0 08/02/2020 1814   GLUCOSEU NEGATIVE 08/02/2020 1814   HGBUR NEGATIVE 08/02/2020 1814   BILIRUBINUR NEGATIVE 08/02/2020 1814   KETONESUR NEGATIVE 08/02/2020 1814   PROTEINUR 30 (A) 08/02/2020 1814   UROBILINOGEN 0.2 09/13/2013 0523   NITRITE NEGATIVE 08/02/2020 1814   LEUKOCYTESUR LARGE (A) 08/02/2020 1814    Radiological Exams on Admission: DG Chest Portable 1 View  Result Date: 12/18/2020 CLINICAL DATA:  Shortness of breath. EXAM: PORTABLE CHEST 1 VIEW COMPARISON:  06/28/2020 and CT chest 03/22/2015. FINDINGS: Trachea is midline. Heart size stable. Loop recorder projects over the left chest. Lungs are clear. No pleural fluid. IMPRESSION: No acute findings. Electronically Signed   By: Lorin Picket M.D.   On: 12/18/2020 13:08    EKG: Independently reviewed. Sinus, diffuse st depressions  Assessment/Plan COPD exacerbation?     - place in obs, tele     -  continue inhalers, steroids     - check CT chest     -  guaifenesin     - COVID, flu negative     - check procal  CKD 4 Azotemia     - she is at her baseline Scr     - she is at her baseline BUN     - watch nephrotoxins  EKG abnormality DOE     - diffuse st depressions noted on EKG     - denies check pain     - BNP mildly elevated; no particularly volume overloaded     - check echo     - EDP spoke with cards, will see, appreciate assistance   Paroxysmal a fib Hx of DVT/PE     - continue eliquis     - she's in sinus tach  Hx of right breast CA     - continue outpt follow up  DM2     - SSI, A1c, DM diet, glucose checks  HTN     - continue home regimen when confirmed  Macrocytic anemia     - no evidence of bleed     - check iron studies; follow  DVT prophylaxis: eliquis  Code Status: FULL  Family Communication: None at bedside  Consults called: EDP spoke with caridology   Status is: Observation  The patient remains OBS appropriate and will d/c before 2 midnights.  Jonnie Finner DO [facility]  If 7PM-7AM, please contact night-coverage [email or web address]  12/18/2020, 3:08 PM

## 2020-12-18 NOTE — Progress Notes (Signed)
    OVERNIGHT PROGRESS REPORT  Notified by RR RN for work of breathing, respiratory concerns. Patient has recently received neb treatment and Ativan for procedure. Patient awakes easily and can answer questions.  She is given a breathing treatment due to rhonchi and shows/states improved breathing. She is on telemetry monitoring and we will add continuous pulse oximetry.  SPO2 remains in the >96% range. Patient states that she does use breathing treatments at home and that her breathing is now improving after this treatment , and repositioning in bed.  Nursing will report any further.  Gershon Cull MSNA MSN ACNPC-AG Acute Care Nurse Practitioner [place]

## 2020-12-19 ENCOUNTER — Observation Stay (HOSPITAL_COMMUNITY): Payer: Medicare Other

## 2020-12-19 DIAGNOSIS — I5032 Chronic diastolic (congestive) heart failure: Secondary | ICD-10-CM | POA: Diagnosis present

## 2020-12-19 DIAGNOSIS — Z9221 Personal history of antineoplastic chemotherapy: Secondary | ICD-10-CM | POA: Diagnosis not present

## 2020-12-19 DIAGNOSIS — E0822 Diabetes mellitus due to underlying condition with diabetic chronic kidney disease: Secondary | ICD-10-CM | POA: Diagnosis not present

## 2020-12-19 DIAGNOSIS — J209 Acute bronchitis, unspecified: Secondary | ICD-10-CM | POA: Diagnosis present

## 2020-12-19 DIAGNOSIS — I959 Hypotension, unspecified: Secondary | ICD-10-CM | POA: Diagnosis present

## 2020-12-19 DIAGNOSIS — Z85828 Personal history of other malignant neoplasm of skin: Secondary | ICD-10-CM | POA: Diagnosis not present

## 2020-12-19 DIAGNOSIS — J9601 Acute respiratory failure with hypoxia: Secondary | ICD-10-CM | POA: Diagnosis present

## 2020-12-19 DIAGNOSIS — E1122 Type 2 diabetes mellitus with diabetic chronic kidney disease: Secondary | ICD-10-CM | POA: Diagnosis present

## 2020-12-19 DIAGNOSIS — Z86711 Personal history of pulmonary embolism: Secondary | ICD-10-CM | POA: Diagnosis not present

## 2020-12-19 DIAGNOSIS — Z20822 Contact with and (suspected) exposure to covid-19: Secondary | ICD-10-CM | POA: Diagnosis present

## 2020-12-19 DIAGNOSIS — R55 Syncope and collapse: Secondary | ICD-10-CM

## 2020-12-19 DIAGNOSIS — Z853 Personal history of malignant neoplasm of breast: Secondary | ICD-10-CM | POA: Diagnosis not present

## 2020-12-19 DIAGNOSIS — R9431 Abnormal electrocardiogram [ECG] [EKG]: Secondary | ICD-10-CM | POA: Diagnosis not present

## 2020-12-19 DIAGNOSIS — Z7901 Long term (current) use of anticoagulants: Secondary | ICD-10-CM | POA: Diagnosis not present

## 2020-12-19 DIAGNOSIS — I130 Hypertensive heart and chronic kidney disease with heart failure and stage 1 through stage 4 chronic kidney disease, or unspecified chronic kidney disease: Secondary | ICD-10-CM | POA: Diagnosis present

## 2020-12-19 DIAGNOSIS — Z923 Personal history of irradiation: Secondary | ICD-10-CM | POA: Diagnosis not present

## 2020-12-19 DIAGNOSIS — Z79899 Other long term (current) drug therapy: Secondary | ICD-10-CM | POA: Diagnosis not present

## 2020-12-19 DIAGNOSIS — Z803 Family history of malignant neoplasm of breast: Secondary | ICD-10-CM | POA: Diagnosis not present

## 2020-12-19 DIAGNOSIS — I272 Pulmonary hypertension, unspecified: Secondary | ICD-10-CM | POA: Diagnosis present

## 2020-12-19 DIAGNOSIS — J441 Chronic obstructive pulmonary disease with (acute) exacerbation: Secondary | ICD-10-CM | POA: Diagnosis present

## 2020-12-19 DIAGNOSIS — Z8673 Personal history of transient ischemic attack (TIA), and cerebral infarction without residual deficits: Secondary | ICD-10-CM | POA: Diagnosis not present

## 2020-12-19 DIAGNOSIS — R0600 Dyspnea, unspecified: Secondary | ICD-10-CM | POA: Diagnosis present

## 2020-12-19 DIAGNOSIS — Z66 Do not resuscitate: Secondary | ICD-10-CM | POA: Diagnosis present

## 2020-12-19 DIAGNOSIS — Z86718 Personal history of other venous thrombosis and embolism: Secondary | ICD-10-CM | POA: Diagnosis not present

## 2020-12-19 DIAGNOSIS — D539 Nutritional anemia, unspecified: Secondary | ICD-10-CM | POA: Diagnosis present

## 2020-12-19 DIAGNOSIS — M810 Age-related osteoporosis without current pathological fracture: Secondary | ICD-10-CM | POA: Diagnosis present

## 2020-12-19 DIAGNOSIS — I480 Paroxysmal atrial fibrillation: Secondary | ICD-10-CM | POA: Diagnosis present

## 2020-12-19 DIAGNOSIS — N184 Chronic kidney disease, stage 4 (severe): Secondary | ICD-10-CM | POA: Diagnosis present

## 2020-12-19 LAB — ECHOCARDIOGRAM COMPLETE
AR max vel: 1.19 cm2
AV Area VTI: 1.2 cm2
AV Area mean vel: 1.17 cm2
AV Mean grad: 25 mmHg
AV Peak grad: 45 mmHg
Ao pk vel: 3.36 m/s
Area-P 1/2: 3.68 cm2
Height: 58 in
MV VTI: 1.96 cm2
S' Lateral: 2.7 cm
Weight: 2752 oz

## 2020-12-19 LAB — GLUCOSE, CAPILLARY
Glucose-Capillary: 210 mg/dL — ABNORMAL HIGH (ref 70–99)
Glucose-Capillary: 258 mg/dL — ABNORMAL HIGH (ref 70–99)
Glucose-Capillary: 217 mg/dL — ABNORMAL HIGH (ref 70–99)
Glucose-Capillary: 193 mg/dL — ABNORMAL HIGH (ref 70–99)

## 2020-12-19 LAB — COMPREHENSIVE METABOLIC PANEL
ALT: 14 U/L (ref 0–44)
AST: 16 U/L (ref 15–41)
Albumin: 3.8 g/dL (ref 3.5–5.0)
Alkaline Phosphatase: 74 U/L (ref 38–126)
Anion gap: 9 (ref 5–15)
BUN: 101 mg/dL — ABNORMAL HIGH (ref 8–23)
CO2: 32 mmol/L (ref 22–32)
Calcium: 10.5 mg/dL — ABNORMAL HIGH (ref 8.9–10.3)
Chloride: 100 mmol/L (ref 98–111)
Creatinine, Ser: 2.72 mg/dL — ABNORMAL HIGH (ref 0.44–1.00)
GFR, Estimated: 16 mL/min — ABNORMAL LOW (ref >60)
Glucose, Bld: 261 mg/dL — ABNORMAL HIGH (ref 70–99)
Potassium: 4.6 mmol/L (ref 3.5–5.1)
Sodium: 141 mmol/L (ref 135–145)
Total Bilirubin: 0.5 mg/dL (ref 0.3–1.2)
Total Protein: 7.4 g/dL (ref 6.5–8.1)

## 2020-12-19 LAB — IRON AND TIBC
Iron: 27 ug/dL — ABNORMAL LOW (ref 28–170)
Saturation Ratios: 8 % — ABNORMAL LOW (ref 10.4–31.8)
TIBC: 343 ug/dL (ref 250–450)
UIBC: 316 ug/dL

## 2020-12-19 LAB — CBC
HCT: 30.8 % — ABNORMAL LOW (ref 36.0–46.0)
Hemoglobin: 9.8 g/dL — ABNORMAL LOW (ref 12.0–15.0)
MCH: 32.3 pg (ref 26.0–34.0)
MCHC: 31.8 g/dL (ref 30.0–36.0)
MCV: 101.7 fL — ABNORMAL HIGH (ref 80.0–100.0)
Platelets: 186 10*3/uL (ref 150–400)
RBC: 3.03 MIL/uL — ABNORMAL LOW (ref 3.87–5.11)
RDW: 14.8 % (ref 11.5–15.5)
WBC: 7.1 10*3/uL (ref 4.0–10.5)
nRBC: 0 % (ref 0.0–0.2)

## 2020-12-19 MED ORDER — METHYLPREDNISOLONE SODIUM SUCC 40 MG IJ SOLR
40.0000 mg | Freq: Two times a day (BID) | INTRAMUSCULAR | Status: DC
  Administered 2020-12-19 – 2020-12-21 (×4): 40 mg via INTRAVENOUS
  Filled 2020-12-19 (×4): qty 1

## 2020-12-19 MED ORDER — FAMOTIDINE 20 MG PO TABS
10.0000 mg | ORAL_TABLET | ORAL | Status: DC
  Administered 2020-12-19 – 2020-12-21 (×2): 10 mg via ORAL
  Filled 2020-12-19 (×2): qty 1

## 2020-12-19 MED ORDER — AZITHROMYCIN 250 MG PO TABS
250.0000 mg | ORAL_TABLET | Freq: Every day | ORAL | Status: DC
  Administered 2020-12-20 – 2020-12-22 (×3): 250 mg via ORAL
  Filled 2020-12-19 (×3): qty 1

## 2020-12-19 MED ORDER — ISOSORBIDE MONONITRATE ER 30 MG PO TB24
30.0000 mg | ORAL_TABLET | Freq: Every day | ORAL | Status: DC
  Administered 2020-12-19 – 2020-12-22 (×4): 30 mg via ORAL
  Filled 2020-12-19 (×4): qty 1

## 2020-12-19 MED ORDER — LIP MEDEX EX OINT
TOPICAL_OINTMENT | CUTANEOUS | Status: AC
  Administered 2020-12-19: 75
  Filled 2020-12-19: qty 7

## 2020-12-19 MED ORDER — HYDRALAZINE HCL 50 MG PO TABS
50.0000 mg | ORAL_TABLET | Freq: Three times a day (TID) | ORAL | Status: DC
  Administered 2020-12-19 – 2020-12-22 (×10): 50 mg via ORAL
  Filled 2020-12-19 (×9): qty 1

## 2020-12-19 MED ORDER — IPRATROPIUM BROMIDE 0.02 % IN SOLN
0.5000 mg | Freq: Four times a day (QID) | RESPIRATORY_TRACT | Status: DC
  Administered 2020-12-19 – 2020-12-21 (×6): 0.5 mg via RESPIRATORY_TRACT
  Filled 2020-12-19 (×6): qty 2.5

## 2020-12-19 MED ORDER — BUDESONIDE 0.25 MG/2ML IN SUSP
0.2500 mg | Freq: Two times a day (BID) | RESPIRATORY_TRACT | Status: DC
  Administered 2020-12-19 – 2020-12-22 (×5): 0.25 mg via RESPIRATORY_TRACT
  Filled 2020-12-19 (×5): qty 2

## 2020-12-19 MED ORDER — INSULIN ASPART 100 UNIT/ML IJ SOLN
0.0000 [IU] | Freq: Three times a day (TID) | INTRAMUSCULAR | Status: DC
  Administered 2020-12-20: 5 [IU] via SUBCUTANEOUS
  Administered 2020-12-20 – 2020-12-21 (×3): 3 [IU] via SUBCUTANEOUS
  Administered 2020-12-21: 11 [IU] via SUBCUTANEOUS
  Administered 2020-12-21: 8 [IU] via SUBCUTANEOUS
  Administered 2020-12-22: 3 [IU] via SUBCUTANEOUS

## 2020-12-19 MED ORDER — APIXABAN 2.5 MG PO TABS
2.5000 mg | ORAL_TABLET | Freq: Two times a day (BID) | ORAL | Status: DC
  Administered 2020-12-19 – 2020-12-22 (×7): 2.5 mg via ORAL
  Filled 2020-12-19 (×7): qty 1

## 2020-12-19 MED ORDER — AMLODIPINE BESYLATE 5 MG PO TABS
2.5000 mg | ORAL_TABLET | Freq: Every day | ORAL | Status: DC
  Administered 2020-12-19 – 2020-12-22 (×4): 2.5 mg via ORAL
  Filled 2020-12-19 (×4): qty 1

## 2020-12-19 MED ORDER — AZITHROMYCIN 250 MG PO TABS
500.0000 mg | ORAL_TABLET | Freq: Every day | ORAL | Status: AC
  Administered 2020-12-19: 500 mg via ORAL
  Filled 2020-12-19: qty 2

## 2020-12-19 MED ORDER — LEVALBUTEROL HCL 0.63 MG/3ML IN NEBU
0.6300 mg | INHALATION_SOLUTION | Freq: Four times a day (QID) | RESPIRATORY_TRACT | Status: DC
  Administered 2020-12-19 – 2020-12-21 (×7): 0.63 mg via RESPIRATORY_TRACT
  Filled 2020-12-19 (×7): qty 3

## 2020-12-19 MED ORDER — INSULIN ASPART 100 UNIT/ML IJ SOLN
0.0000 [IU] | Freq: Every day | INTRAMUSCULAR | Status: DC
Start: 2020-12-19 — End: 2020-12-22

## 2020-12-19 MED ORDER — POLYETHYLENE GLYCOL 3350 17 G PO PACK
17.0000 g | PACK | Freq: Every day | ORAL | Status: DC
  Administered 2020-12-19 – 2020-12-22 (×4): 17 g via ORAL
  Filled 2020-12-19 (×4): qty 1

## 2020-12-19 NOTE — Plan of Care (Signed)
  Problem: Education: Goal: Knowledge of disease or condition will improve Outcome: Progressing Goal: Knowledge of the prescribed therapeutic regimen will improve Outcome: Progressing   

## 2020-12-19 NOTE — Progress Notes (Signed)
  Echocardiogram 2D Echocardiogram has been performed.  Sabrina [name] F 12/19/2020, 11:56 AM

## 2020-12-19 NOTE — Progress Notes (Addendum)
PROGRESS NOTE    Sabrina [name]  UUE:[id number] DOB: September 22, 1926 DOA: 12/18/2020 PCP: [name], MD    Brief Narrative:  87 year old female with a history of PAF, diastolic heart failure, CKD stage IV, admitted to the hospital with shortness of breath, wheezing, cough.  Chest x-ray did not indicate any signs of pneumonia.  She does not carry diagnosis of COPD and is not on any chronic respiratory medications.  Suspect that she may have an acute bronchitis.  Being treated with IV steroids, bronchodilators and antibiotics.  She is also requiring supplemental oxygen due to hypoxia.   Assessment & Plan:   Active Problems:   Acute respiratory failure with hypoxia (HCC)   Chronic diastolic congestive heart failure (HCC)   CKD (chronic kidney disease), stage IV (HCC)   DM (diabetes mellitus) ([place])   Essential hypertension   Paroxysmal atrial fibrillation (HCC)   Dyspnea   Acute bronchitis -Patient does not carry a diagnosis of COPD -She is not on any chronic respiratory medications -She continues to have wheezing and shortness of breath -We will continue on bronchodilators, steroids and antibiotics  Acute respiratory failure with hypoxia -She is not chronically on oxygen -Noted to have oxygen saturations of 86% on room air -She has associated increased work of breathing and wheezing -Currently on 3 L of oxygen with improvement of oxygen saturations -Weaned off oxygen as tolerated  Chronic kidney disease stage IV -Creatinine is currently near baseline  Chronic diastolic congestive heart failure -Currently appears to be euvolemic  EKG changes -Noted to have deep ST depressions -Since her heart rate was faster, this may be rate related -Echo shows normal LV function and mild pulmonary hypertension -Due to her advanced age, elevated creatinine, it was felt best to treat the patient medically.  Nitrates have been added to her regimen  Hypertension -Currently on Norvasc  and hydralazine -Continue to monitor  PAF -CHA2DS2-VASc of 7 -Currently anticoagulated with Eliquis -Heart rate stable   DM, type II -Chronically on glipizide, held on admission -Continue sliding scale insulin   DVT prophylaxis: apixaban (ELIQUIS) tablet 2.5 mg Start: 12/19/20 1030 apixaban (ELIQUIS) tablet 2.5 mg  Code Status: DNR, confirmed with patient Family Communication: No family present Disposition Plan: Status is: Inpatient  Remains inpatient appropriate because: Continued shortness of breath and wheezing needing IV steroids         Consultants:  Cardiology  Procedures:    Antimicrobials:  Azithromycin 10/18 >   Subjective: Still short of breath and wheezing.  She is also coughing.  Objective: Vitals:   12/19/20 1631 12/19/20 1632 12/19/20 1657 12/19/20 2002  BP:   (!) 139/57   Pulse:   84   Resp:   19   Temp:   98.5 F (36.9 C)   TempSrc:   Oral   SpO2: (!) 86% 95% 96% 97%  Weight:      Height:        Intake/Output Summary (Last 24 hours) at 12/19/2020 2059 Last data filed at 12/19/2020 0600 Gross per 24 hour  Intake --  Output 550 ml  Net -550 ml   Filed Weights   12/18/20 1248  Weight: 78 kg    Examination:  General exam: Appears calm and comfortable  Respiratory system: Bilateral rhonchi and wheezes. Respiratory effort normal. Cardiovascular system: S1 & S2 heard, RRR. No JVD, murmurs, rubs, gallops or clicks. No pedal edema. Gastrointestinal system: Abdomen is nondistended, soft and nontender. No organomegaly or masses felt. Normal bowel sounds heard. Central  nervous system: Alert and oriented. No focal neurological deficits. Extremities: Symmetric 5 x 5 power. Skin: No rashes, lesions or ulcers Psychiatry: Judgement and insight appear normal. Mood & affect appropriate.     Data Reviewed: I have personally reviewed following labs and imaging studies  CBC: Recent Labs  Lab 12/18/20 1230 12/19/20 0500  WBC 8.9 7.1   NEUTROABS 4.4  --   HGB 10.2* 9.8*  HCT 32.2* 30.8*  MCV 100.9* 101.7*  PLT 198 220   Basic Metabolic Panel: Recent Labs  Lab 12/18/20 1230 12/19/20 0500  NA 140 141  K 3.9 4.6  CL 101 100  CO2 25 32  GLUCOSE 191* 261*  BUN 99* 101*  CREATININE 2.30* 2.72*  CALCIUM 10.2 10.5*   GFR: Estimated Creatinine Clearance: 11.8 mL/min (A) (by C-G formula based on SCr of 2.72 mg/dL (H)). Liver Function Tests: Recent Labs  Lab 12/18/20 1230 12/19/20 0500  AST 19 16  ALT 13 14  ALKPHOS 73 74  BILITOT 1.1 0.5  PROT 7.4 7.4  ALBUMIN 4.0 3.8   No results for input(s): LIPASE, AMYLASE in the last 168 hours. No results for input(s): AMMONIA in the last 168 hours. Coagulation Profile: No results for input(s): INR, PROTIME in the last 168 hours. Cardiac Enzymes: No results for input(s): CKTOTAL, CKMB, CKMBINDEX, TROPONINI in the last 168 hours. BNP (last 3 results) No results for input(s): PROBNP in the last 8760 hours. HbA1C: Recent Labs    12/18/20 1658  HGBA1C 7.1*   CBG: Recent Labs  Lab 12/18/20 1644 12/18/20 2242 12/19/20 0835 12/19/20 1200 12/19/20 1636  GLUCAP 181* 247* 210* 258* 217*   Lipid Profile: No results for input(s): CHOL, HDL, LDLCALC, TRIG, CHOLHDL, LDLDIRECT in the last 72 hours. Thyroid Function Tests: No results for input(s): TSH, T4TOTAL, FREET4, T3FREE, THYROIDAB in the last 72 hours. Anemia Panel: Recent Labs    12/19/20 0500  TIBC 343  IRON 27*   Sepsis Labs: Recent Labs  Lab 12/18/20 1713  PROCALCITON <0.10    Recent Results (from the past 240 hour(s))  Resp Panel by RT-PCR (Flu A&B, Covid) Nasopharyngeal Swab     Status: None   Collection Time: 12/18/20  1:21 PM   Specimen: Nasopharyngeal Swab; Nasopharyngeal(NP) swabs in vial transport medium  Result Value Ref Range Status   SARS Coronavirus 2 by RT PCR NEGATIVE NEGATIVE Final    Comment: (NOTE) SARS-CoV-2 target nucleic acids are NOT DETECTED.  The SARS-CoV-2 RNA is  generally detectable in upper respiratory specimens during the acute phase of infection. The lowest concentration of SARS-CoV-2 viral copies this assay can detect is 138 copies/mL. A negative result does not preclude SARS-Cov-2 infection and should not be used as the sole basis for treatment or other patient management decisions. A negative result may occur with  improper specimen collection/handling, submission of specimen other than nasopharyngeal swab, presence of viral mutation(s) within the areas targeted by this assay, and inadequate number of viral copies(<138 copies/mL). A negative result must be combined with clinical observations, patient history, and epidemiological information. The expected result is Negative.  Fact Sheet for Patients:  EntrepreneurPulse.com.au  Fact Sheet for Healthcare Providers:  IncredibleEmployment.be  This test is no t yet approved or cleared by the [place] FDA and  has been authorized for detection and/or diagnosis of SARS-CoV-2 by FDA under an Emergency Use Authorization (EUA). This EUA will remain  in effect (meaning this test can be used) for the duration of the COVID-19 declaration under  Section 564(b)(1) of the Act, 21 U.S.C.section 360bbb-3(b)(1), unless the authorization is terminated  or revoked sooner.       Influenza A by PCR NEGATIVE NEGATIVE Final   Influenza B by PCR NEGATIVE NEGATIVE Final    Comment: (NOTE) The Xpert Xpress SARS-CoV-2/FLU/RSV plus assay is intended as an aid in the diagnosis of influenza from Nasopharyngeal swab specimens and should not be used as a sole basis for treatment. Nasal washings and aspirates are unacceptable for Xpert Xpress SARS-CoV-2/FLU/RSV testing.  Fact Sheet for Patients: EntrepreneurPulse.com.au  Fact Sheet for Healthcare Providers: IncredibleEmployment.be  This test is not yet approved or cleared by the [place] FDA and has been authorized for detection and/or diagnosis of SARS-CoV-2 by FDA under an Emergency Use Authorization (EUA). This EUA will remain in effect (meaning this test can be used) for the duration of the COVID-19 declaration under Section 564(b)(1) of the Act, 21 U.S.C. section 360bbb-3(b)(1), unless the authorization is terminated or revoked.  Performed at [facility], [place] [address], [place], [place] [address]          Radiology Studies: CT CHEST WO CONTRAST  Result Date: 12/18/2020 CLINICAL DATA:  Respiratory failure EXAM: CT CHEST WITHOUT CONTRAST TECHNIQUE: Multidetector CT imaging of the chest was performed following the standard protocol without IV contrast. COMPARISON:  03/22/2015 FINDINGS: Cardiovascular: Dense mitral valve annular calcifications. Heart is mildly enlarged. Scattered coronary artery and aortic calcifications. No aneurysm. Mediastinum/Nodes: No mediastinal, hilar, or axillary adenopathy. Trachea and esophagus are unremarkable. Thyroid unremarkable. Lungs/Pleura: Airspace opacity peripherally in the right upper lobe and apex, stable since prior study compatible with scarring. Left lung clear. No effusions. Upper Abdomen: Gallstones noted within the gallbladder. No acute findings Musculoskeletal: Loop recorder device in the left chest wall. No acute bony abnormality. IMPRESSION: Scarring in the right upper lobe/apex, unchanged since prior study. No acute cardiopulmonary disease. Coronary artery disease. Cholelithiasis. Aortic Atherosclerosis (ICD10-I70.0). Electronically Signed   By: Rolm Baptise M.D.   On: 12/18/2020 19:17   DG Chest Portable 1 View  Result Date: 12/18/2020 CLINICAL DATA:  Shortness of breath. EXAM: PORTABLE CHEST 1 VIEW COMPARISON:  06/28/2020 and CT chest 03/22/2015. FINDINGS: Trachea is midline. Heart size stable. Loop recorder projects over the left chest. Lungs are clear. No pleural fluid. IMPRESSION: No acute  findings. Electronically Signed   By: Lorin Picket M.D.   On: 12/18/2020 13:08   ECHOCARDIOGRAM COMPLETE  Result Date: 12/19/2020    ECHOCARDIOGRAM REPORT   Patient Name:   CELSEY ASSELIN [facility] Date of Exam: 12/19/2020 Medical Rec #:  [id number]    Height:       58.0 in Accession #:    [id number]   Weight:       172.0 lb Date of Birth:  08-17-1926    BSA:          1.708 m Patient Age:    35 years     BP:           145/100 mmHg Patient Gender: F            HR:           81 bpm. Exam Location:  Inpatient Procedure: 2D Echo, Cardiac Doppler and Color Doppler Indications:    Syncope R55  History:        Patient has prior history of Echocardiogram examinations, most                 recent 05/27/2017. Aortic Valve Disease.  Sonographer:    Merrie Roof RDCS Referring Phys: [id number] [place]  1. Left ventricular ejection fraction, by estimation, is 60 to 65%. The left ventricle has normal function. The left ventricle has no regional wall motion abnormalities. There is mild left ventricular hypertrophy. Left ventricular diastolic parameters are consistent with Grade I diastolic dysfunction (impaired relaxation).  2. Right ventricular systolic function is normal. The right ventricular size is normal. There is mildly elevated pulmonary artery systolic pressure. The estimated right ventricular systolic pressure is 59.4 mmHg.  3. Left atrial size was severely dilated.  4. The mitral valve is degenerative. No evidence of mitral valve regurgitation. Mild to moderate mitral stenosis. The mean mitral valve gradient is 4.0 mmHg with MVA 1.96 cm^2 by VTI. Severe mitral annular calcification.  5. The aortic valve was not well visualized. Aortic valve regurgitation is mild. Moderate aortic valve stenosis. Aortic valve area, by VTI measures 1.20 cm. Aortic valve mean gradient measures 25.0 mmHg.  6. The inferior vena cava is dilated in size with >50% respiratory variability, suggesting right atrial pressure of 8 mmHg.  FINDINGS  Left Ventricle: Left ventricular ejection fraction, by estimation, is 60 to 65%. The left ventricle has normal function. The left ventricle has no regional wall motion abnormalities. The left ventricular internal cavity size was normal in size. There is  mild left ventricular hypertrophy. Left ventricular diastolic parameters are consistent with Grade I diastolic dysfunction (impaired relaxation). Right Ventricle: The right ventricular size is normal. No increase in right ventricular wall thickness. Right ventricular systolic function is normal. There is mildly elevated pulmonary artery systolic pressure. The tricuspid regurgitant velocity is 2.81  m/s, and with an assumed right atrial pressure of 8 mmHg, the estimated right ventricular systolic pressure is 58.5 mmHg. Left Atrium: Left atrial size was severely dilated. Right Atrium: Right atrial size was normal in size. Pericardium: There is no evidence of pericardial effusion. Mitral Valve: The mitral valve is degenerative in appearance. There is moderate thickening of the mitral valve leaflet(s). Severe mitral annular calcification. No evidence of mitral valve regurgitation. Mild to moderate mitral valve stenosis. The mean mitral valve gradient is 4.0 mmHg. Tricuspid Valve: The tricuspid valve is normal in structure. Tricuspid valve regurgitation is trivial. Aortic Valve: The aortic valve was not well visualized. Aortic valve regurgitation is mild. Moderate aortic stenosis is present. Aortic valve mean gradient measures 25.0 mmHg. Aortic valve peak gradient measures 45.0 mmHg. Aortic valve area, by VTI measures 1.20 cm. Pulmonic Valve: The pulmonic valve was normal in structure. Pulmonic valve regurgitation is not visualized. Aorta: The aortic root is normal in size and structure. Venous: The inferior vena cava is dilated in size with greater than 50% respiratory variability, suggesting right atrial pressure of 8 mmHg. IAS/Shunts: No atrial level shunt  detected by color flow Doppler.  LEFT VENTRICLE PLAX 2D LVIDd:         3.90 cm   Diastology LVIDs:         2.70 cm   LV e' medial:    7.18 cm/s LV PW:         1.30 cm   LV E/e' medial:  18.5 LV IVS:        1.40 cm   LV e' lateral:   7.83 cm/s LVOT diam:     1.90 cm   LV E/e' lateral: 17.0 LV SV:         82 LV SV Index:   48 LVOT Area:  2.84 cm  RIGHT VENTRICLE          IVC RV Basal diam:  4.00 cm  IVC diam: 2.50 cm LEFT ATRIUM              Index        RIGHT ATRIUM           Index LA diam:        4.00 cm  2.34 cm/m   RA Area:     17.30 cm LA Vol (A2C):   127.0 ml 74.35 ml/m  RA Volume:   42.20 ml  24.71 ml/m LA Vol (A4C):   77.7 ml  45.49 ml/m LA Biplane Vol: 102.0 ml 59.72 ml/m  AORTIC VALVE AV Area (Vmax):    1.19 cm AV Area (Vmean):   1.17 cm AV Area (VTI):     1.20 cm AV Vmax:           335.50 cm/s AV Vmean:          229.500 cm/s AV VTI:            0.688 m AV Peak Grad:      45.0 mmHg AV Mean Grad:      25.0 mmHg LVOT Vmax:         141.00 cm/s LVOT Vmean:        94.700 cm/s LVOT VTI:          0.290 m LVOT/AV VTI ratio: 0.42  AORTA Ao Root diam: 3.20 cm MITRAL VALVE                TRICUSPID VALVE MV Area (PHT): 3.68 cm     TR Peak grad:   31.6 mmHg MV Area VTI:   1.96 cm     TR Vmax:        281.00 cm/s MV Mean grad:  4.0 mmHg MV VTI:        0.42 m       SHUNTS MV Decel Time: 206 msec     Systemic VTI:  0.29 m MV E velocity: 133.00 cm/s  Systemic Diam: 1.90 cm MV A velocity: 166.00 cm/s MV E/A ratio:  0.80 Dalton McleanMD Electronically signed by Franki Monte Signature Date/Time: 12/19/2020/12:33:17 PM    Final         Scheduled Meds:  amLODipine  2.5 mg Oral Daily   apixaban  2.5 mg Oral BID   [START ON 12/20/2020] azithromycin  250 mg Oral Daily   budesonide (PULMICORT) nebulizer solution  0.25 mg Nebulization BID   famotidine  10 mg Oral QODAY   guaiFENesin  600 mg Oral BID   hydrALAZINE  50 mg Oral TID   insulin aspart  0-6 Units Subcutaneous TID WC   ipratropium  0.5 mg  Nebulization Q6H   isosorbide mononitrate  30 mg Oral Daily   levalbuterol  0.63 mg Nebulization Q6H   methylPREDNISolone (SOLU-MEDROL) injection  40 mg Intravenous BID   polyethylene glycol  17 g Oral Daily   Continuous Infusions:   LOS: 0 days    Time spent: 52mins    Kathie Dike, MD [facility]   If 7PM-7AM, please contact night-coverage [email or web address]  12/19/2020, 8:59 PM

## 2020-12-19 NOTE — Plan of Care (Signed)
  Problem: Education: Goal: Knowledge of disease or condition will improve Outcome: Not Applicable Goal: Knowledge of the prescribed therapeutic regimen will improve Outcome: Not Applicable Goal: Individualized Educational Video(s) Outcome: Not Applicable

## 2020-12-19 NOTE — Consult Note (Addendum)
Cardiology Consultation:   Patient ID: Ernestine Rohman MRN: [id number]; DOB: 1927-02-15  Admit date: 12/18/2020 Date of Consult: 12/19/2020  PCP:  [name], MD   [facility] Providers Cardiologist:  Mertie Moores, MD new, has only seen Afib clinic previously  Patient Profile:   Chanah Leatrice Parilla is a 87 y.o. female with a hx of HTN, breast cancer, chemotherapy -induced DVT/PE, DM2, HLD, CKD stage III-IV, CVA, and PAF who is being seen 12/19/2020 for the evaluation of SOB at the request of Dr. Roderic Palau.  History of Present Illness:   Ms. Critzer has only followed in our Afib clinic. She was diagnosed with Afib on ILR 09/26/18 following CVA. Afib has been generally asymptomatic.  She has had several mechanical falls. She is on low dose eliquis for age and renal function. Last ILR interrogation did not show any Afib. She was last seen in Afib clinic on 08/16/20 and was doing well at that time. Echo in 2019 with LVEF 55-60% with mild LVH, grade 1 DD, no WMA, mild AS, and severe MAC.   She presented to [facility] with worsening dyspnea with minimal exertion.   On arrival: Hb 10.2 --> 9.8 sCr 2.30 --2.70 K 3.9 --> 4.6 BNP 167 HS troponin 16 --> 19 A1c 7.1% CXR negative for acute findings  CT chest  with aortic atherosclerosis, coronary artery disease, no acute cardiopulmonary disease.   EKG with diffuse ST depressions inferior and lateral leads - more pronounced that prior tracings. Cardiology was consulted.  During my interview, she describes dyspnea and wheezing that has been worsening since last week. One of her at-home caregivers was at bedside and reported trying to improve her wheezing with inhalers at home.   Sh sleeps in a recliner at baseline. She has been "leaning to left" over the last 4 months, but reported no residual weakness from her CVA prior to that. She uses a walker to ambulate at home and does not use home O2. She denies bleeding and chest pain. No lower extremity  edema or weight gain prior to arrival.    Past Medical History:  Diagnosis Date   Arthritis    "mostly in my hands, lower back" (06/25/2017)   Basal cell carcinoma (BCC) of face 1983   Breast cancer, right breast ([place]) 03/2013   Chronic kidney disease (CKD), stage III (moderate) ([place])    nephrologist, Dr. [name]   Dental crowns present    DVT (deep venous thrombosis) ([place]) ~ 06/2013   "? side"   Gout    "on daily RX" (06/25/2017)   Heart murmur    no known problems; states did not know she had murmur until age 46   High cholesterol    Hypertension    fluctuates, especially when stressed; has been on med. > 20 yr.   Immature cataract    Non-insulin dependent type 2 diabetes mellitus ([place])    Personal history of chemotherapy    Personal history of radiation therapy    Pulmonary embolism ([place]) ~ 06/2013   Radiation 09/06/13-10/20/13   Right Breast Cancer   Stroke ([place]) 05/2017   just visual problems since (06/25/2017)   Wears partial dentures    lower    Past Surgical History:  Procedure Laterality Date   AXILLARY LYMPH NODE DISSECTION Right 03/30/2013   Procedure: AXILLARY LYMPH NODE DISSECTION;  Surgeon: Rolm Bookbinder, MD;  Location: [place];  Service: General;  Laterality: Right;   [place]   "face"  BREAST BIOPSY Right 03/17/2013   BREAST CYST EXCISION Right 11/1958   benign   BREAST LUMPECTOMY Right 03/30/2013   BREAST LUMPECTOMY WITH NEEDLE LOCALIZATION Right 03/30/2013   Procedure: BREAST LUMPECTOMY WITH NEEDLE LOCALIZATION;  Surgeon: Rolm Bookbinder, MD;  Location: [place];  Service: General;  Laterality: Right;   DILATION AND CURETTAGE OF UTERUS     LOOP RECORDER INSERTION N/A 08/15/2017   Procedure: LOOP RECORDER INSERTION;  Surgeon: Evans Lance, MD;  Location: [place] [facility];  Service: Cardiovascular;  Laterality: N/A;   PORT-A-CATH REMOVAL  2016   PORTACATH PLACEMENT N/A 04/15/2013   Procedure: INSERTION PORT-A-CATH;   Surgeon: Rolm Bookbinder, MD;  Location: [place];  Service: General;  Laterality: N/A;   RE-EXCISION OF BREAST CANCER,SUPERIOR MARGINS Right 04/15/2013   Procedure: RE-EXCISION OF RIGHT BREAST  MARGINS;  Surgeon: Rolm Bookbinder, MD;  Location: [place];  Service: General;  Laterality: Right;   TONSILLECTOMY  ~ 1935/1936     Home Medications:  Prior to Admission medications   Medication Sig Start Date End Date Taking? Authorizing Provider  albuterol (VENTOLIN HFA) 108 (90 Base) MCG/ACT inhaler Inhale 1-2 puffs into the lungs every 6 (six) hours as needed for wheezing or shortness of breath. 03/22/19  Yes [provider]  allopurinol (ZYLOPRIM) 100 MG tablet Take 100 mg by mouth daily. 01/17/16  Yes [provider]  amLODipine (NORVASC) 2.5 MG tablet Take 2.5 mg by mouth daily. 04/11/20  Yes [provider]  cetirizine (ZYRTEC) 10 MG tablet Take 10 mg by mouth daily.   Yes [provider]  cholecalciferol (VITAMIN D) 1000 UNITS tablet Take 2,000 Units by mouth daily.   Yes [provider]  cloNIDine (CATAPRES) 0.2 MG tablet Take 0.2 mg by mouth 2 (two) times daily.   Yes [provider]  Dextromethorphan-guaiFENesin (MUCINEX DM PO) Take 30 mLs by mouth daily as needed (cough).   Yes [provider]  ELIQUIS 2.5 MG TABS tablet TAKE 1 TABLET BY MOUTH TWICE DAILY. Patient taking differently: Take 2.5 mg by mouth 2 (two) times daily. 07/04/20  Yes Fenton, Clint R, PA  famotidine (PEPCID) 20 MG tablet Take 20 mg by mouth daily. 11/09/19  Yes [provider]  furosemide (LASIX) 40 MG tablet Take 40 mg by mouth daily. Per Dr. If over 166lb take 60m a day   Yes [provider]  gabapentin (NEURONTIN) 100 MG capsule Take 3 capsules (300 mg total) by mouth at bedtime. Take 200 mg by mouth twice a day and take 300 mg by mouth at bedtime Patient taking differently: Take 200-300 mg by mouth See  admin instructions. Take 200 mg by mouth twice a day and take 300 mg by mouth at bedtime 11/06/18  Yes NMariel Aloe MD  glipiZIDE (GLUCOTROL) 5 MG tablet Take 5 mg by mouth 2 (two) times daily before a meal.    Yes [provider]  hydrALAZINE (APRESOLINE) 50 MG tablet Take 50 mg by mouth 3 (three) times daily.   Yes [provider]  hydrocortisone cream 1 % Apply 1 application topically daily as needed for itching.   Yes [provider]  polyethylene glycol (MIRALAX / GLYCOLAX) packet Take 17 g by mouth daily.   Yes [provider]  traMADol (ULTRAM) 50 MG tablet Take 50 mg by mouth 2 (two) times daily as needed for moderate pain.    Yes [provider]  COVID-19 mRNA bivalent vaccine, [place] ([place]  COVID-19 VAC BIVALENT) injection Inject into the muscle. 12/07/20   Carlyle Basques, MD  COVID-19 mRNA vaccine, Pfizer, 30 MCG/0.3ML injection Inject into the muscle. 06/16/20   Carlyle Basques, MD  denosumab (PROLIA) 60 MG/ML SOSY injection Inject 60 mg into the skin every 6 (six) months. Patient not taking: No sig reported    [provider]  hydrALAZINE (APRESOLINE) 10 MG tablet Take 1 tablet (10 mg total) by mouth 3 (three) times daily. Patient not taking: Reported on 12/18/2020 09/29/18 12/18/20  Hosie Poisson, MD  TRUE METRIX BLOOD GLUCOSE TEST test strip  07/19/19   [provider]  TRUEplus Lancets 28G MISC Apply topically. 08/20/19   [provider]    Inpatient Medications: Scheduled Meds:  amLODipine  2.5 mg Oral Daily   apixaban  2.5 mg Oral BID   famotidine  10 mg Oral QODAY   guaiFENesin  600 mg Oral BID   hydrALAZINE  50 mg Oral TID   insulin aspart  0-6 Units Subcutaneous TID WC   levalbuterol  0.63 mg Nebulization Q6H   polyethylene glycol  17 g Oral Daily   predniSONE  40 mg Oral Q breakfast   Continuous Infusions:  PRN Meds: acetaminophen **OR** acetaminophen, levalbuterol, LORazepam, ondansetron  **OR** ondansetron (ZOFRAN) IV  Allergies:    Allergies  Allergen Reactions   Beta Adrenergic Blockers Other (See Comments)    Unknown    Social History:   Social History   Socioeconomic History   Marital status: Married    Spouse name: Not on file   Number of children: 0   Years of education: Not on file   Highest education level: Bachelor's degree (e.g., BA, AB, BS)  Occupational History   Not on file  Tobacco Use   Smoking status: Never   Smokeless tobacco: Never   Tobacco comments:    only smoked 2 packs cigarettes total; husband quit in 1971  Vaping Use   Vaping Use: Never used  Substance and Sexual Activity   Alcohol use: Not Currently    Alcohol/week: 3.0 standard drinks    Types: 3 Glasses of wine per week   Drug use: No   Sexual activity: Not Currently  Other Topics Concern   Not on file  Social History Narrative   Lives at home with her husband   Right handed   Caffeine: 1 coffee daily at most    Social Determinants of Health   Financial Resource Strain: Not on file  Food Insecurity: Not on file  Transportation Needs: Not on file  Physical Activity: Not on file  Stress: Not on file  Social Connections: Not on file  Intimate Partner Violence: Not on file    Family History:    Family History  Problem Relation Age of Onset   Pneumonia Mother    Heart attack Father    Breast cancer Other 35       niece   Breast cancer Other 15       niece   Ovarian cancer Other        niece     ROS:  Please see the history of present illness.   All other ROS reviewed and negative.     Physical Exam/Data:   Vitals:   12/19/20 0157 12/19/20 0429 12/19/20 0755 12/19/20 0857  BP: (!) 123/48 137/74  (!) 155/60  Pulse: 79 82  91  Resp: 14 16    Temp: 98.2 F (36.8 C) (!) 97.5 F (36.4 C)  TempSrc: Oral Oral    SpO2: 92% 96% 92%   Weight:      Height:        Intake/Output Summary (Last 24 hours) at 12/19/2020 1242 Last data filed at 12/19/2020  0600 Gross per 24 hour  Intake 118 ml  Output 550 ml  Net -432 ml   Last 3 Weights 12/18/2020 08/16/2020 08/02/2020  Weight (lbs) 172 lb 171 lb 6.4 oz 170 lb  Weight (kg) 78.019 kg 77.747 kg 77.111 kg     Body mass index is 35.95 kg/m.  General:  elderly obese female in NAD HEENT: normal Neck: no JVD Vascular: No carotid bruits; Distal pulses 2+ bilaterally Cardiac:  normal S1, S2; RRR; 2/3 systolic murmur Lungs:  wheezing throughout  Abd: soft, nontender, no hepatomegaly  Ext: no edema Musculoskeletal:  No deformities, BUE and BLE strength normal and equal Skin: warm and dry  Neuro:  CNs 2-12 intact, no focal abnormalities noted Psych:  Normal affect   EKG:  The EKG was personally reviewed and demonstrates:  sinus tachycardia with HR 113 and ST depression inferior and lateral leads Telemetry:  Telemetry was personally reviewed and demonstrates:  sinus tachycardia yesterday in the 120s, now sinus rhythm in the 80s  Relevant CV Studies:  Echo today pending  Echo 2019: Study Conclusions  - Procedure narrative: Transthoracic echocardiography. Image    quality was poor. The study was technically difficult, as a    result of poor sound wave transmission.  - Left ventricle: The cavity size was normal. Wall thickness was    increased in a pattern of mild LVH. There was mild concentric    hypertrophy. Systolic function was normal. The estimated ejection    fraction was in the range of 55% to 60%. Wall motion was normal;    there were no regional wall motion abnormalities. Doppler    parameters are consistent with abnormal left ventricular    relaxation (grade 1 diastolic dysfunction).  - Aortic valve: There was mild stenosis. Valve area (VTI): 1.75    cm^2. Valve area (Vmax): 1.8 cm^2. Valve area (Vmean): 1.7 cm^2.  - Mitral valve: Severely calcified annulus. Valve area by    continuity equation (using LVOT flow): 1.95 cm^2.  - Atrial septum: No defect or patent foramen ovale was  identified.   Laboratory Data:  High Sensitivity Troponin:   Recent Labs  Lab 12/18/20 1230 12/18/20 1447  TROPONINIHS 16 19*     Chemistry Recent Labs  Lab 12/18/20 1230 12/19/20 0500  NA 140 141  K 3.9 4.6  CL 101 100  CO2 25 32  GLUCOSE 191* 261*  BUN 99* 101*  CREATININE 2.30* 2.72*  CALCIUM 10.2 10.5*  GFRNONAA 20* 16*  ANIONGAP 14 9    Recent Labs  Lab 12/18/20 1230 12/19/20 0500  PROT 7.4 7.4  ALBUMIN 4.0 3.8  AST 19 16  ALT 13 14  ALKPHOS 73 74  BILITOT 1.1 0.5   Lipids No results for input(s): CHOL, TRIG, HDL, LABVLDL, LDLCALC, CHOLHDL in the last 168 hours.  Hematology Recent Labs  Lab 12/18/20 1230 12/19/20 0500  WBC 8.9 7.1  RBC 3.19* 3.03*  HGB 10.2* 9.8*  HCT 32.2* 30.8*  MCV 100.9* 101.7*  MCH 32.0 32.3  MCHC 31.7 31.8  RDW 14.6 14.8  PLT 198 186   Thyroid No results for input(s): TSH, FREET4 in the last 168 hours.  BNP Recent Labs  Lab 12/18/20 1230  BNP 167.0*    DDimer No  results for input(s): DDIMER in the last 168 hours.   Radiology/Studies:  CT CHEST WO CONTRAST  Result Date: 12/18/2020 CLINICAL DATA:  Respiratory failure EXAM: CT CHEST WITHOUT CONTRAST TECHNIQUE: Multidetector CT imaging of the chest was performed following the standard protocol without IV contrast. COMPARISON:  03/22/2015 FINDINGS: Cardiovascular: Dense mitral valve annular calcifications. Heart is mildly enlarged. Scattered coronary artery and aortic calcifications. No aneurysm. Mediastinum/Nodes: No mediastinal, hilar, or axillary adenopathy. Trachea and esophagus are unremarkable. Thyroid unremarkable. Lungs/Pleura: Airspace opacity peripherally in the right upper lobe and apex, stable since prior study compatible with scarring. Left lung clear. No effusions. Upper Abdomen: Gallstones noted within the gallbladder. No acute findings Musculoskeletal: Loop recorder device in the left chest wall. No acute bony abnormality. IMPRESSION: Scarring in the right  upper lobe/apex, unchanged since prior study. No acute cardiopulmonary disease. Coronary artery disease. Cholelithiasis. Aortic Atherosclerosis (ICD10-I70.0). Electronically Signed   By: Rolm Baptise M.D.   On: 12/18/2020 19:17   DG Chest Portable 1 View  Result Date: 12/18/2020 CLINICAL DATA:  Shortness of breath. EXAM: PORTABLE CHEST 1 VIEW COMPARISON:  06/28/2020 and CT chest 03/22/2015. FINDINGS: Trachea is midline. Heart size stable. Loop recorder projects over the left chest. Lungs are clear. No pleural fluid. IMPRESSION: No acute findings. Electronically Signed   By: Lorin Picket M.D.   On: 12/18/2020 13:08   ECHOCARDIOGRAM COMPLETE  Result Date: 12/19/2020    ECHOCARDIOGRAM REPORT   Patient Name:   IEASHA BOEREMA [facility] Date of Exam: 12/19/2020 Medical Rec #:  [id number]    Height:       58.0 in Accession #:    [id number]   Weight:       172.0 lb Date of Birth:  1926/05/16    BSA:          1.708 m Patient Age:    50 years     BP:           145/100 mmHg Patient Gender: F            HR:           81 bpm. Exam Location:  Inpatient Procedure: 2D Echo, Cardiac Doppler and Color Doppler Indications:    Syncope R55  History:        Patient has prior history of Echocardiogram examinations, most                 recent 05/27/2017. Aortic Valve Disease.  Sonographer:    Merrie Roof RDCS Referring Phys: [id number] [place]  1. Left ventricular ejection fraction, by estimation, is 60 to 65%. The left ventricle has normal function. The left ventricle has no regional wall motion abnormalities. There is mild left ventricular hypertrophy. Left ventricular diastolic parameters are consistent with Grade I diastolic dysfunction (impaired relaxation).  2. Right ventricular systolic function is normal. The right ventricular size is normal. There is mildly elevated pulmonary artery systolic pressure. The estimated right ventricular systolic pressure is 41.6 mmHg.  3. Left atrial size was severely dilated.  4. The  mitral valve is degenerative. No evidence of mitral valve regurgitation. Mild to moderate mitral stenosis. The mean mitral valve gradient is 4.0 mmHg with MVA 1.96 cm^2 by VTI. Severe mitral annular calcification.  5. The aortic valve was not well visualized. Aortic valve regurgitation is mild. Moderate aortic valve stenosis. Aortic valve area, by VTI measures 1.20 cm. Aortic valve mean gradient measures 25.0 mmHg.  6. The inferior vena cava is dilated in size  with >50% respiratory variability, suggesting right atrial pressure of 8 mmHg. FINDINGS  Left Ventricle: Left ventricular ejection fraction, by estimation, is 60 to 65%. The left ventricle has normal function. The left ventricle has no regional wall motion abnormalities. The left ventricular internal cavity size was normal in size. There is  mild left ventricular hypertrophy. Left ventricular diastolic parameters are consistent with Grade I diastolic dysfunction (impaired relaxation). Right Ventricle: The right ventricular size is normal. No increase in right ventricular wall thickness. Right ventricular systolic function is normal. There is mildly elevated pulmonary artery systolic pressure. The tricuspid regurgitant velocity is 2.81  m/s, and with an assumed right atrial pressure of 8 mmHg, the estimated right ventricular systolic pressure is 75.1 mmHg. Left Atrium: Left atrial size was severely dilated. Right Atrium: Right atrial size was normal in size. Pericardium: There is no evidence of pericardial effusion. Mitral Valve: The mitral valve is degenerative in appearance. There is moderate thickening of the mitral valve leaflet(s). Severe mitral annular calcification. No evidence of mitral valve regurgitation. Mild to moderate mitral valve stenosis. The mean mitral valve gradient is 4.0 mmHg. Tricuspid Valve: The tricuspid valve is normal in structure. Tricuspid valve regurgitation is trivial. Aortic Valve: The aortic valve was not well visualized. Aortic  valve regurgitation is mild. Moderate aortic stenosis is present. Aortic valve mean gradient measures 25.0 mmHg. Aortic valve peak gradient measures 45.0 mmHg. Aortic valve area, by VTI measures 1.20 cm. Pulmonic Valve: The pulmonic valve was normal in structure. Pulmonic valve regurgitation is not visualized. Aorta: The aortic root is normal in size and structure. Venous: The inferior vena cava is dilated in size with greater than 50% respiratory variability, suggesting right atrial pressure of 8 mmHg. IAS/Shunts: No atrial level shunt detected by color flow Doppler.  LEFT VENTRICLE PLAX 2D LVIDd:         3.90 cm   Diastology LVIDs:         2.70 cm   LV e' medial:    7.18 cm/s LV PW:         1.30 cm   LV E/e' medial:  18.5 LV IVS:        1.40 cm   LV e' lateral:   7.83 cm/s LVOT diam:     1.90 cm   LV E/e' lateral: 17.0 LV SV:         82 LV SV Index:   48 LVOT Area:     2.84 cm  RIGHT VENTRICLE          IVC RV Basal diam:  4.00 cm  IVC diam: 2.50 cm LEFT ATRIUM              Index        RIGHT ATRIUM           Index LA diam:        4.00 cm  2.34 cm/m   RA Area:     17.30 cm LA Vol (A2C):   127.0 ml 74.35 ml/m  RA Volume:   42.20 ml  24.71 ml/m LA Vol (A4C):   77.7 ml  45.49 ml/m LA Biplane Vol: 102.0 ml 59.72 ml/m  AORTIC VALVE AV Area (Vmax):    1.19 cm AV Area (Vmean):   1.17 cm AV Area (VTI):     1.20 cm AV Vmax:           335.50 cm/s AV Vmean:          229.500 cm/s AV VTI:  0.688 m AV Peak Grad:      45.0 mmHg AV Mean Grad:      25.0 mmHg LVOT Vmax:         141.00 cm/s LVOT Vmean:        94.700 cm/s LVOT VTI:          0.290 m LVOT/AV VTI ratio: 0.42  AORTA Ao Root diam: 3.20 cm MITRAL VALVE                TRICUSPID VALVE MV Area (PHT): 3.68 cm     TR Peak grad:   31.6 mmHg MV Area VTI:   1.96 cm     TR Vmax:        281.00 cm/s MV Mean grad:  4.0 mmHg MV VTI:        0.42 m       SHUNTS MV Decel Time: 206 msec     Systemic VTI:  0.29 m MV E velocity: 133.00 cm/s  Systemic Diam: 1.90 cm MV A  velocity: 166.00 cm/s MV E/A ratio:  0.80 Dalton McleanMD Electronically signed by Franki Monte Signature Date/Time: 12/19/2020/12:33:17 PM    Final      Assessment and Plan:   EKG changes - hs troponin mild and flat - EKG with inferior and lateral ST depressions - more pronounced compared to prior tracing - she denies chest pain - suspect may be related to repolarization  - echo pending   Systolic murmur on exam - echo pending read - may be contributing to dyspnea   Chronic diastolic heart failure - echo in 2019 with grade 1 DD - repeat echo pending - hold off on further diuresis   PAF Hx of DVT/PE Chronic anticoagulation - appropriately dosed eliquis for age and renal function - not on BB   Hypertension - maintained on 0.2 mg clonidine BID, hydralazine 50 mg TID, and 2.5 mg amlodipine - BP somewhat labile - no medication changes for now   Dyspnea Wheezing - per primary - does not appear to be cardiac in nature - avoid BB if possible for now    Risk Assessment/Risk Scores:     New York Heart Association (NYHA) Functional Class NYHA Class I  CHA2DS2-VASc Score = 7   This indicates a 11.2% annual risk of stroke. The patient's score is based upon: CHF History: 0 HTN History: 1 Diabetes History: 1 Stroke History: 2 Vascular Disease History: 0 Age Score: 2 Gender Score: 1      For questions or updates, please contact [place] Please consult [email or web address] for contact info under    Signed, Ledora Bottcher, [place]  12/19/2020 12:42 PM  Attending Note:   The patient was seen and examined.  Agree with assessment and plan as noted above.  Changes made to the above note as needed.  Patient seen and independently examined with Doreene Adas, PA .   We discussed all aspects of the encounter. I agree with the assessment and plan as stated above.    ECG changes:   new since last May.  Deep ST depression .  Hr is a bit faster and These may be rate  related.  This could also represent ischemia   Her echo shows normal LV systolic function with grade I diastolic dysfunction .  She has mild pulmonary hypertension with an estimated PA pressure of 40.  Given her normal left ventricular systolic function, age of 14 years. CKD with creatinine of 2.7, and relatively overall generally weak  condition, I think that invasive or interventional cardiology procedures may be more than she is able to get through.  I think that we should consider empiric therapy with isosorbide.  I discussed this with her and her caregiver.  She seems to understand and agrees with and on aggressive approach to her EKG changes.    2.  Wheezing: She has some wheezing which I think is separate from any cardiac dysfunction.  She has normal left ventricular systolic function I suspect she needs to be treated a bit more aggressively from a pulmonary standpoint.     I have spent a total of 40 minutes with patient reviewing hospital  notes , telemetry, EKGs, labs and examining patient as well as establishing an assessment and plan that was discussed with the patient.  > 50% of time was spent in direct patient care.    Thayer Headings, Brooke Bonito., MD, [facility] 12/19/2020, 1:23 PM [address]  [name] Pager [phone]

## 2020-12-20 DIAGNOSIS — I10 Essential (primary) hypertension: Secondary | ICD-10-CM

## 2020-12-20 DIAGNOSIS — E0822 Diabetes mellitus due to underlying condition with diabetic chronic kidney disease: Secondary | ICD-10-CM

## 2020-12-20 DIAGNOSIS — R9431 Abnormal electrocardiogram [ECG] [EKG]: Secondary | ICD-10-CM

## 2020-12-20 DIAGNOSIS — I5032 Chronic diastolic (congestive) heart failure: Secondary | ICD-10-CM

## 2020-12-20 DIAGNOSIS — N184 Chronic kidney disease, stage 4 (severe): Secondary | ICD-10-CM

## 2020-12-20 DIAGNOSIS — I480 Paroxysmal atrial fibrillation: Secondary | ICD-10-CM

## 2020-12-20 DIAGNOSIS — J9601 Acute respiratory failure with hypoxia: Principal | ICD-10-CM

## 2020-12-20 LAB — GLUCOSE, CAPILLARY
Glucose-Capillary: 189 mg/dL — ABNORMAL HIGH (ref 70–99)
Glucose-Capillary: 233 mg/dL — ABNORMAL HIGH (ref 70–99)
Glucose-Capillary: 173 mg/dL — ABNORMAL HIGH (ref 70–99)
Glucose-Capillary: 188 mg/dL — ABNORMAL HIGH (ref 70–99)

## 2020-12-20 LAB — CBC
HCT: 29.2 % — ABNORMAL LOW (ref 36.0–46.0)
Hemoglobin: 9.2 g/dL — ABNORMAL LOW (ref 12.0–15.0)
MCH: 31.8 pg (ref 26.0–34.0)
MCHC: 31.5 g/dL (ref 30.0–36.0)
MCV: 101 fL — ABNORMAL HIGH (ref 80.0–100.0)
Platelets: 199 10*3/uL (ref 150–400)
RBC: 2.89 MIL/uL — ABNORMAL LOW (ref 3.87–5.11)
RDW: 14.6 % (ref 11.5–15.5)
WBC: 17.5 10*3/uL — ABNORMAL HIGH (ref 4.0–10.5)
nRBC: 0 % (ref 0.0–0.2)

## 2020-12-20 LAB — BASIC METABOLIC PANEL
Anion gap: 7 (ref 5–15)
BUN: 107 mg/dL — ABNORMAL HIGH (ref 8–23)
CO2: 32 mmol/L (ref 22–32)
Calcium: 10.2 mg/dL (ref 8.9–10.3)
Chloride: 100 mmol/L (ref 98–111)
Creatinine, Ser: 2.29 mg/dL — ABNORMAL HIGH (ref 0.44–1.00)
GFR, Estimated: 20 mL/min — ABNORMAL LOW (ref >60)
Glucose, Bld: 225 mg/dL — ABNORMAL HIGH (ref 70–99)
Potassium: 4.9 mmol/L (ref 3.5–5.1)
Sodium: 139 mmol/L (ref 135–145)

## 2020-12-20 MED ORDER — TRAMADOL HCL 50 MG PO TABS: 50.0000 mg | ORAL_TABLET | Freq: Two times a day (BID) | ORAL | Status: DC | PRN

## 2020-12-20 MED ORDER — GABAPENTIN 100 MG PO CAPS
200.0000 mg | ORAL_CAPSULE | Freq: Two times a day (BID) | ORAL | Status: DC
  Administered 2020-12-20 – 2020-12-22 (×5): 200 mg via ORAL
  Filled 2020-12-20 (×5): qty 2

## 2020-12-20 MED ORDER — VITAMIN D 25 MCG (1000 UNIT) PO TABS
2000.0000 [IU] | ORAL_TABLET | Freq: Every day | ORAL | Status: DC
  Administered 2020-12-20 – 2020-12-22 (×3): 2000 [IU] via ORAL
  Filled 2020-12-20 (×3): qty 2

## 2020-12-20 MED ORDER — HYDROCORTISONE 1 % EX CREA: 1.0000 "application " | TOPICAL_CREAM | Freq: Every day | CUTANEOUS | Status: DC | PRN

## 2020-12-20 MED ORDER — LORATADINE 10 MG PO TABS
10.0000 mg | ORAL_TABLET | Freq: Every day | ORAL | Status: DC
  Administered 2020-12-20 – 2020-12-22 (×3): 10 mg via ORAL
  Filled 2020-12-20 (×3): qty 1

## 2020-12-20 MED ORDER — GUAIFENESIN-DM 100-10 MG/5ML PO SYRP
5.0000 mL | ORAL_SOLUTION | ORAL | Status: DC | PRN
  Administered 2020-12-20: 5 mL via ORAL
  Filled 2020-12-20: qty 10

## 2020-12-20 MED ORDER — ALLOPURINOL 100 MG PO TABS
100.0000 mg | ORAL_TABLET | Freq: Every day | ORAL | Status: DC
  Administered 2020-12-20 – 2020-12-22 (×3): 100 mg via ORAL
  Filled 2020-12-20 (×3): qty 1

## 2020-12-20 MED ORDER — FUROSEMIDE 10 MG/ML IJ SOLN
40.0000 mg | Freq: Every day | INTRAMUSCULAR | Status: DC
  Administered 2020-12-20 – 2020-12-21 (×2): 40 mg via INTRAVENOUS
  Filled 2020-12-20 (×2): qty 4

## 2020-12-20 MED ORDER — GABAPENTIN 300 MG PO CAPS
300.0000 mg | ORAL_CAPSULE | Freq: Every day | ORAL | Status: DC
  Administered 2020-12-20 – 2020-12-21 (×2): 300 mg via ORAL
  Filled 2020-12-20 (×2): qty 1

## 2020-12-20 NOTE — TOC Initial Note (Signed)
Transition of Care [facility]) - Initial/Assessment Note    Patient Details  Name: Sabrina [name] MRN: [id number] Date of Birth: 10-18-1926  Transition of Care [facility]) CM/SW Contact:    Lynnell Catalan, RN Phone Number: 12/20/2020, 3:02 PM  Clinical Narrative:                 Spoke with pt and caregiver [name] (from [facility]) at bedside. Physical therapy recommendations for SNF discussed. Pt states that she does not want to go to SNF and wants to go back home. [name] states that pt has caregivers most of the day but is on her own at night. Pt states that she would like caregivers 24hrs a day for awhile at dc. [name] encouraged me to speak with pt niece [name] about recommendations at home. Spoke with [name] via phone about recommendations as well and that pt would like to go home. [name] to look into pt having 24hr care at dc. Choice offered to pt for home health services and [facility] chosen. [facility] liaison alerted of referral. Will need HHPT order at dc. Pt is currently on 02 in the hospital. Unsure if pt will need 02 at home. TOC will continue to follow.  Expected Discharge Plan: [place] Barriers to Discharge: Continued Medical Work up   Patient Goals and CMS Choice Patient states their goals for this hospitalization and ongoing recovery are:: To go home CMS Medicare.gov Compare Post Acute Care list provided to:: Patient Choice offered to / list presented to : Patient  Expected Discharge Plan and Services Expected Discharge Plan: [place]   Discharge Planning Services: CM Consult Post Acute Care Choice: [place] arrangements for the past 2 months: Single Family Home             HH Arranged: PT [place]: [place] Date [place]: 12/20/20 Time HH Agency Contacted: 1501 Representative spoke with at [place]: Tommi Rumps  Prior Living Arrangements/Services Living arrangements for the past 2 months: [place] with:: Self Patient language and need for interpreter reviewed:: Yes Do you feel safe going back to the place where you live?: Yes      Need for Family Participation in Patient Care: Yes (Comment) Care giver support system in place?: Yes (comment) Current home services: Homehealth aide Criminal Activity/Legal Involvement Pertinent to Current Situation/Hospitalization: No - Comment as needed  Activities of Daily Living Home Assistive Devices/Equipment: Eyeglasses, Environmental consultant (specify type), Shower chair without back, CBG Meter, Grab bars in shower, Wheelchair, Other (Comment), Blood pressure cuff (walk-in shower, front wheeled walker, manual wheelchair, standard toilet) ADL Screening (condition at time of admission) Patient's cognitive ability adequate to safely complete daily activities?: Yes Is the patient deaf or have difficulty hearing?: Yes (left ear hearing issues) Does the patient have difficulty seeing, even when wearing glasses/contacts?: No Does the patient have difficulty concentrating, remembering, or making decisions?: Yes Patient able to express need for assistance with ADLs?: Yes Does the patient have difficulty dressing or bathing?: Yes Independently performs ADLs?: No Communication: Independent Dressing (OT): Needs assistance Is this a change from baseline?: Pre-admission baseline Grooming: Needs assistance Is this a change from baseline?: Pre-admission baseline Feeding: Needs assistance Is this a change from baseline?: Pre-admission baseline Bathing: Needs assistance Is this a change from baseline?: Pre-admission baseline Toileting: Needs assistance Is this a change from baseline?: Pre-admission baseline In/Out Bed: Needs assistance Is this a change from baseline?: Pre-admission baseline  Walks in Home: Needs assistance Is this a change from baseline?: Pre-admission baseline Does the patient have difficulty walking or climbing stairs?: Yes (secondary to  weakness and shortness of breath) Weakness of Legs: Both Weakness of Arms/Hands: None  Permission Sought/Granted Permission sought to share information with : Facility Art therapist granted to share information with : Yes, Verbal Permission Granted     Permission granted to share info w AGENCY: [place]        Emotional Assessment Appearance:: Appears stated age Attitude/Demeanor/Rapport: Guarded Affect (typically observed): Calm Orientation: : Oriented to Self, Oriented to Place, Oriented to  Time, Oriented to Situation Alcohol / Substance Use: Not Applicable Psych Involvement: No (comment)  Admission diagnosis:  Dyspnea [R06.00] SOB (shortness of breath) [R06.02] Acute respiratory failure with hypoxia (HCC) [J96.01] Patient Active Problem List   Diagnosis Date Noted   Nonspecific abnormal electrocardiogram (ECG) (EKG)    Dyspnea 12/18/2020   Abnormal gait 05/18/2020   Body mass index (BMI) 35.0-35.9, adult 05/18/2020   Cardiac pacemaker in situ 05/18/2020   Diabetic nephropathy ([place]) 05/18/2020   IgM monoclonal gammopathy of uncertain significance 05/18/2020   Malignant hypertensive chronic kidney disease 05/18/2020   Morbid obesity ([place]) 05/18/2020   Thrombophilia ([place]) 05/18/2020   Secondary hypercoagulable state ([place]) 08/16/2019   Osteoporosis 05/04/2019   Hypercalcemia 11/04/2018   History of stroke 11/04/2018   Paroxysmal atrial fibrillation ([place]) 11/04/2018   Acute kidney injury ([place])    Acute metabolic encephalopathy 31/49/7026   Personal history of DVT and pulm embolus 10/26/2018   Personal history of other venous thrombosis and embolism 10/26/2018   UTI (urinary tract infection) 10/24/2018   Clavicle fracture 09/26/2018   Degeneration of lumbar intervertebral disc 10/01/2017   Blurry vision, bilateral    Acute blood loss anemia    History of breast cancer 05/30/2017   Physical debility 05/30/2017   Occipital infarction ([place]) 05/30/2017    Pressure injury of skin 05/28/2017   Fall at home 05/27/2017   CKD (chronic kidney disease), stage IV ([place]) 05/27/2017   High cholesterol 05/27/2017   DM (diabetes mellitus) ([place]) 05/27/2017   Essential hypertension 05/27/2017   Gout 05/27/2017   Elevated troponin 05/27/2017   Lumbar radiculopathy 05/15/2017   Pain of right calf 04/30/2017   Asymmetrical left sensorineural hearing loss 02/14/2016   Impacted cerumen of left ear 02/14/2016   Lipoma of lower extremity 09/12/2014   Abnormal x-ray 03/15/2014   Chronic diastolic congestive heart failure ([place]) 02/23/2014   Candidiasis of skin 01/13/2014   Osteopenia 11/30/2013   Hypoglycemia 09/13/2013   Acute respiratory failure with hypoxia ([place]) 06/14/2013   History of pulmonary embolism 06/10/2013   Nausea and vomiting 06/10/2013   Accelerated hypertension 06/10/2013   Candidiasis of vagina 05/19/2013   Aortic stenosis 04/22/2013   Edema leg 04/22/2013   Breast cancer of upper-outer quadrant of right female breast ([place]); in remission 03/19/2013   PCP:  [name], MD Pharmacy:   [facility], [place] [address] Phone: [phone] Fax: [phone]     Social Determinants of Health (SDOH) Interventions    Readmission Risk Interventions Readmission Risk Prevention Plan 12/20/2020  Transportation Screening Complete  PCP or Specialist Appt within 5-7 Days Complete  Home Care Screening Complete  Medication Review (RN CM) Complete  Some recent data might be hidden

## 2020-12-20 NOTE — Progress Notes (Signed)
Progress Note  Patient Name: Sabrina [name] Date of Encounter: 12/20/2020  [facility] Cardiologist: Mertie Moores, MD    Subjective   87 year old female who was admitted with shortness of breath, wheezing, cough.  She had EKG changes and we are asked to see her.  She has ST segment depression in the anterior leads.  She has chronic kidney disease with a baseline creatinine of around 2.2.  She has normal left ventricular systolic function with an EF of 60 to 65%.  She has grade 1 diastolic dysfunction.  She has mildly elevated pulmonary artery pressures. She has moderate aortic stenosis.  She has multiple underlying medical issues.  I discussed the case with her primary medical doctor, Dr. Felipa Eth.  We both agree that she is a poor candidate for any invasive or interventional procedures.  As such, we are treating her EKG changes empirically.   We empirically started isosorbide 30 mg yesterday.  She seems to be tolerating it very well.  Has developed significant wheezing , cough   Inpatient Medications    Scheduled Meds:  allopurinol  100 mg Oral Daily   amLODipine  2.5 mg Oral Daily   apixaban  2.5 mg Oral BID   azithromycin  250 mg Oral Daily   budesonide (PULMICORT) nebulizer solution  0.25 mg Nebulization BID   cholecalciferol  2,000 Units Oral Daily   famotidine  10 mg Oral QODAY   furosemide  40 mg Intravenous Daily   gabapentin  200 mg Oral BID AC   gabapentin  300 mg Oral QHS   guaiFENesin  600 mg Oral BID   hydrALAZINE  50 mg Oral TID   insulin aspart  0-15 Units Subcutaneous TID WC   insulin aspart  0-5 Units Subcutaneous QHS   ipratropium  0.5 mg Nebulization Q6H   isosorbide mononitrate  30 mg Oral Daily   levalbuterol  0.63 mg Nebulization Q6H   loratadine  10 mg Oral Daily   methylPREDNISolone (SOLU-MEDROL) injection  40 mg Intravenous BID   polyethylene glycol  17 g Oral Daily   Continuous Infusions:  PRN Meds: acetaminophen **OR**  acetaminophen, guaiFENesin-dextromethorphan, hydrocortisone cream, levalbuterol, LORazepam, ondansetron **OR** ondansetron (ZOFRAN) IV, traMADol   Vital Signs    Vitals:   12/19/20 2105 12/20/20 0544 12/20/20 0806 12/20/20 0808  BP: (!) 144/54 (!) 119/47    Pulse: 88 88    Resp: 20 17    Temp: 98.7 F (37.1 C) 98.5 F (36.9 C)    TempSrc: Oral Oral    SpO2: 97% 99% 97% 97%  Weight:      Height:        Intake/Output Summary (Last 24 hours) at 12/20/2020 1317 Last data filed at 12/20/2020 0910 Gross per 24 hour  Intake 120 ml  Output --  Net 120 ml   Last 3 Weights 12/18/2020 08/16/2020 08/02/2020  Weight (lbs) 172 lb 171 lb 6.4 oz 170 lb  Weight (kg) 78.019 kg 77.747 kg 77.111 kg      Telemetry    NSR - Personally Reviewed  ECG   - Personally Reviewed  Physical Exam    GEN: Elderly, morbidly obese female, no acute distress Neck: No JVD Cardiac: RRR,   soft murmur Respiratory: Clear to auscultation bilaterally. GI: Soft, nontender, non-distended  MS: No edema; No deformity. Neuro:  Nonfocal  Psych: Normal affect   Labs    High Sensitivity Troponin:   Recent Labs  Lab 12/18/20 1230 12/18/20 1447  TROPONINIHS 16 19*  Chemistry Recent Labs  Lab 12/18/20 1230 12/19/20 0500 12/20/20 0531  NA 140 141 139  K 3.9 4.6 4.9  CL 101 100 100  CO2 25 32 32  GLUCOSE 191* 261* 225*  BUN 99* 101* 107*  CREATININE 2.30* 2.72* 2.29*  CALCIUM 10.2 10.5* 10.2  PROT 7.4 7.4  --   ALBUMIN 4.0 3.8  --   AST 19 16  --   ALT 13 14  --   ALKPHOS 73 74  --   BILITOT 1.1 0.5  --   GFRNONAA 20* 16* 20*  ANIONGAP 14 9 7     Lipids No results for input(s): CHOL, TRIG, HDL, LABVLDL, LDLCALC, CHOLHDL in the last 168 hours.  Hematology Recent Labs  Lab 12/18/20 1230 12/19/20 0500 12/20/20 0531  WBC 8.9 7.1 17.5*  RBC 3.19* 3.03* 2.89*  HGB 10.2* 9.8* 9.2*  HCT 32.2* 30.8* 29.2*  MCV 100.9* 101.7* 101.0*  MCH 32.0 32.3 31.8  MCHC 31.7 31.8 31.5  RDW 14.6 14.8  14.6  PLT 198 186 199   Thyroid No results for input(s): TSH, FREET4 in the last 168 hours.  BNP Recent Labs  Lab 12/18/20 1230  BNP 167.0*    DDimer No results for input(s): DDIMER in the last 168 hours.   Radiology    CT CHEST WO CONTRAST  Result Date: 12/18/2020 CLINICAL DATA:  Respiratory failure EXAM: CT CHEST WITHOUT CONTRAST TECHNIQUE: Multidetector CT imaging of the chest was performed following the standard protocol without IV contrast. COMPARISON:  03/22/2015 FINDINGS: Cardiovascular: Dense mitral valve annular calcifications. Heart is mildly enlarged. Scattered coronary artery and aortic calcifications. No aneurysm. Mediastinum/Nodes: No mediastinal, hilar, or axillary adenopathy. Trachea and esophagus are unremarkable. Thyroid unremarkable. Lungs/Pleura: Airspace opacity peripherally in the right upper lobe and apex, stable since prior study compatible with scarring. Left lung clear. No effusions. Upper Abdomen: Gallstones noted within the gallbladder. No acute findings Musculoskeletal: Loop recorder device in the left chest wall. No acute bony abnormality. IMPRESSION: Scarring in the right upper lobe/apex, unchanged since prior study. No acute cardiopulmonary disease. Coronary artery disease. Cholelithiasis. Aortic Atherosclerosis (ICD10-I70.0). Electronically Signed   By: Rolm Baptise M.D.   On: 12/18/2020 19:17   ECHOCARDIOGRAM COMPLETE  Result Date: 12/19/2020    ECHOCARDIOGRAM REPORT   Patient Name:   Sabrina [name] [facility] Date of Exam: 12/19/2020 Medical Rec #:  [id number]    Height:       58.0 in Accession #:    [id number]   Weight:       172.0 lb Date of Birth:  1926/12/17    BSA:          1.708 m Patient Age:    43 years     BP:           145/100 mmHg Patient Gender: F            HR:           81 bpm. Exam Location:  Inpatient Procedure: 2D Echo, Cardiac Doppler and Color Doppler Indications:    Syncope R55  History:        Patient has prior history of Echocardiogram examinations,  most                 recent 05/27/2017. Aortic Valve Disease.  Sonographer:    Merrie Roof RDCS Referring Phys: [id number] [place]  1. Left ventricular ejection fraction, by estimation, is 60 to 65%. The left ventricle has normal function. The left  ventricle has no regional wall motion abnormalities. There is mild left ventricular hypertrophy. Left ventricular diastolic parameters are consistent with Grade I diastolic dysfunction (impaired relaxation).  2. Right ventricular systolic function is normal. The right ventricular size is normal. There is mildly elevated pulmonary artery systolic pressure. The estimated right ventricular systolic pressure is 60.6 mmHg.  3. Left atrial size was severely dilated.  4. The mitral valve is degenerative. No evidence of mitral valve regurgitation. Mild to moderate mitral stenosis. The mean mitral valve gradient is 4.0 mmHg with MVA 1.96 cm^2 by VTI. Severe mitral annular calcification.  5. The aortic valve was not well visualized. Aortic valve regurgitation is mild. Moderate aortic valve stenosis. Aortic valve area, by VTI measures 1.20 cm. Aortic valve mean gradient measures 25.0 mmHg.  6. The inferior vena cava is dilated in size with >50% respiratory variability, suggesting right atrial pressure of 8 mmHg. FINDINGS  Left Ventricle: Left ventricular ejection fraction, by estimation, is 60 to 65%. The left ventricle has normal function. The left ventricle has no regional wall motion abnormalities. The left ventricular internal cavity size was normal in size. There is  mild left ventricular hypertrophy. Left ventricular diastolic parameters are consistent with Grade I diastolic dysfunction (impaired relaxation). Right Ventricle: The right ventricular size is normal. No increase in right ventricular wall thickness. Right ventricular systolic function is normal. There is mildly elevated pulmonary artery systolic pressure. The tricuspid regurgitant velocity is 2.81   m/s, and with an assumed right atrial pressure of 8 mmHg, the estimated right ventricular systolic pressure is 30.1 mmHg. Left Atrium: Left atrial size was severely dilated. Right Atrium: Right atrial size was normal in size. Pericardium: There is no evidence of pericardial effusion. Mitral Valve: The mitral valve is degenerative in appearance. There is moderate thickening of the mitral valve leaflet(s). Severe mitral annular calcification. No evidence of mitral valve regurgitation. Mild to moderate mitral valve stenosis. The mean mitral valve gradient is 4.0 mmHg. Tricuspid Valve: The tricuspid valve is normal in structure. Tricuspid valve regurgitation is trivial. Aortic Valve: The aortic valve was not well visualized. Aortic valve regurgitation is mild. Moderate aortic stenosis is present. Aortic valve mean gradient measures 25.0 mmHg. Aortic valve peak gradient measures 45.0 mmHg. Aortic valve area, by VTI measures 1.20 cm. Pulmonic Valve: The pulmonic valve was normal in structure. Pulmonic valve regurgitation is not visualized. Aorta: The aortic root is normal in size and structure. Venous: The inferior vena cava is dilated in size with greater than 50% respiratory variability, suggesting right atrial pressure of 8 mmHg. IAS/Shunts: No atrial level shunt detected by color flow Doppler.  LEFT VENTRICLE PLAX 2D LVIDd:         3.90 cm   Diastology LVIDs:         2.70 cm   LV e' medial:    7.18 cm/s LV PW:         1.30 cm   LV E/e' medial:  18.5 LV IVS:        1.40 cm   LV e' lateral:   7.83 cm/s LVOT diam:     1.90 cm   LV E/e' lateral: 17.0 LV SV:         82 LV SV Index:   48 LVOT Area:     2.84 cm  RIGHT VENTRICLE          IVC RV Basal diam:  4.00 cm  IVC diam: 2.50 cm LEFT ATRIUM  Index        RIGHT ATRIUM           Index LA diam:        4.00 cm  2.34 cm/m   RA Area:     17.30 cm LA Vol (A2C):   127.0 ml 74.35 ml/m  RA Volume:   42.20 ml  24.71 ml/m LA Vol (A4C):   77.7 ml  45.49 ml/m LA  Biplane Vol: 102.0 ml 59.72 ml/m  AORTIC VALVE AV Area (Vmax):    1.19 cm AV Area (Vmean):   1.17 cm AV Area (VTI):     1.20 cm AV Vmax:           335.50 cm/s AV Vmean:          229.500 cm/s AV VTI:            0.688 m AV Peak Grad:      45.0 mmHg AV Mean Grad:      25.0 mmHg LVOT Vmax:         141.00 cm/s LVOT Vmean:        94.700 cm/s LVOT VTI:          0.290 m LVOT/AV VTI ratio: 0.42  AORTA Ao Root diam: 3.20 cm MITRAL VALVE                TRICUSPID VALVE MV Area (PHT): 3.68 cm     TR Peak grad:   31.6 mmHg MV Area VTI:   1.96 cm     TR Vmax:        281.00 cm/s MV Mean grad:  4.0 mmHg MV VTI:        0.42 m       SHUNTS MV Decel Time: 206 msec     Systemic VTI:  0.29 m MV E velocity: 133.00 cm/s  Systemic Diam: 1.90 cm MV A velocity: 166.00 cm/s MV E/A ratio:  0.80 Dalton McleanMD Electronically signed by Franki Monte Signature Date/Time: 12/19/2020/12:33:17 PM    Final     Cardiac Studies      Patient Profile     87 y.o. female    Assessment & Plan    1.  Abnormal EKG: Mrs. Kuehnel has ST segment depression on her baseline EKG.  She does not have any angina.  She is a very poor candidate for any invasive or interventional procedures so we have chosen to treat her empirically with isosorbide instead of pursuing any further ischemic work-up.  Her echo shows normal left ventricular systolic function. Her aortic stenosis is only moderate so I doubt it is contributing to her symptoms   2.  Bronchitis: She seems to having a difficult time with bronchitis.  Further work-up per internal medicine team.   [facility] will sign off.   Medication Recommendations:   Other recommendations (labs, testing, etc):   Follow up as an outpatient:  with Dr. Felipa Eth. She does not need to follow up with cardiology  Dr. Felipa Eth can continue the IMdur as an OP .   For questions or updates, please contact [place] Please consult [email or web address] for contact info under        Signed, Mertie Moores, MD  12/20/2020, 1:17 PM

## 2020-12-20 NOTE — Progress Notes (Signed)
Pt experienced coughing spell, perpetuating her N/V. PRN robitussin and zofran given, seemed to help briefly, but did not fully relieve symptoms. Pt states this happens at home frequently. Caregiver at bedside says they can't figure out what causes these spells but that they always begin with a hot flash, followed by coughing spell, which induces N/V.

## 2020-12-20 NOTE — Evaluation (Signed)
Physical Therapy Evaluation Patient Details Name: Sabrina [name] MRN: [id number] DOB: Apr 23, 1926 Today's Date: 12/20/2020  History of Present Illness  Sabrina [name] is a 87 yo female who presents with SOB, wheezing and cough. Pt admitted with possible acute bronchitis/COPD exacerbation with acute resp failure and hypoxia. PMH: afib, HTN, CKD stage IV, CHF   Clinical Impression  Pt admitted with above diagnosis. Pt from home with caregivers 9am-3pm and 5pm-8pm 7 days/week assisting with household chores, bathing and dressing. Pt reports ind with toileting and household ambulation with RW; pt not on home O2 at baseline. Pt currently requiring min A-min guard with transfers and limited ambulation at bedside, requiring 2-3L O2 to maintain adequate oxygenation. Rec SNF due to below baseline and new O2 requiring, possible HHPT pending progress and O2 dependency. Pt currently with functional limitations due to the deficits listed below (see PT Problem List). Pt will benefit from skilled PT to increase their independence and safety with mobility to allow discharge to the venue listed below.          Recommendations for follow up therapy are one component of a multi-disciplinary discharge planning process, led by the attending physician.  Recommendations may be updated based on patient status, additional functional criteria and insurance authorization.  Follow Up Recommendations SNF (possibly HHPT pending O2 dependency and progress)    Equipment Recommendations  None recommended by PT    Recommendations for Other Services OT consult     Precautions / Restrictions Precautions Precautions: Fall Restrictions Weight Bearing Restrictions: No      Mobility  Bed Mobility Overal bed mobility: Needs Assistance Bed Mobility: Supine to Sit  Supine to sit: Mod assist;HOB elevated  General bed mobility comments: HOB elevated, VC for hand placement on bedrail to assist, mod A to upright trunk and scoot  out to EOB with bedpad    Transfers Overall transfer level: Needs assistance Equipment used: Rolling walker (2 wheeled) Transfers: Sit to/from Stand Sit to Stand: Min assist  General transfer comment: min A to steady with power to stand, rocking momentum with heavy use of UEs to assist  Ambulation/Gait Ambulation/Gait assistance: Min guard Gait Distance (Feet): 5 Feet Assistive device: Rolling walker (2 wheeled) Gait Pattern/deviations: Step-to pattern;Decreased stride length;Trunk flexed Gait velocity: decreased   General Gait Details: short steps in room over to recliner, good steadiness without LOB, therapist managing O2 tubing for safety  Stairs            Wheelchair Mobility    Modified Rankin (Stroke Patients Only)       Balance Overall balance assessment: Needs assistance Sitting-balance support: Feet supported Sitting balance-Leahy Scale: Fair Sitting balance - Comments: seated EOB   Standing balance support: During functional activity;Bilateral upper extremity supported Standing balance-Leahy Scale: Poor Standing balance comment: reliant on UE support       Pertinent Vitals/Pain Pain Assessment: No/denies pain    Home Living Family/patient expects to be discharged to:: Private residence Living Arrangements: Alone Available Help at Discharge: Personal care attendant (7 days/week 9am-3pm and 5pm-8pm) Type of Home: House Home Access: Stairs to enter;Ramped entrance (pt uses ramp in back)     Home Layout: One level Home Equipment: Cane - single point;Walker - 2 wheels;Shower seat;Wheelchair - manual      Prior Function Level of Independence: Needs assistance   Gait / Transfers Assistance Needed: RW for household ambulation, sleeps in chair with footstool  ADL's / Homemaking Assistance Needed: caregivers assist with bathing, dressing and household chores; pt  reports toileting independently  Comments: not on home O2 at baseline     Hand  Dominance   Dominant Hand: Right    Extremity/Trunk Assessment   Upper Extremity Assessment Upper Extremity Assessment: Defer to OT evaluation    Lower Extremity Assessment Lower Extremity Assessment: Generalized weakness (symmetrical, AROM WNL, strengh 4-/5, denies numbness/tingling throughout but does report "tender")    Cervical / Trunk Assessment Cervical / Trunk Assessment: Kyphotic  Communication   Communication: No difficulties  Cognition Arousal/Alertness: Awake/alert Behavior During Therapy: WFL for tasks assessed/performed Overall Cognitive Status: Within Functional Limits for tasks assessed      General Comments General comments (skin integrity, edema, etc.): Trialed RA with SpO2 desat to 87%, returned 2L and SpO2 89-93%, increased to 3L and SpO2 97%- left on 3L and notified RN    Exercises     Assessment/Plan    PT Assessment Patient needs continued PT services  PT Problem List Decreased strength;Decreased activity tolerance;Decreased balance;Decreased mobility;Decreased knowledge of use of DME;Cardiopulmonary status limiting activity;Obesity       PT Treatment Interventions DME instruction;Gait training;Functional mobility training;Therapeutic activities;Therapeutic exercise;Balance training;Patient/family education    PT Goals (Current goals can be found in the Care Plan section)  Acute Rehab PT Goals Patient Stated Goal: regain strength PT Goal Formulation: With patient Time For Goal Achievement: 01/03/21 Potential to Achieve Goals: Good    Frequency Min 3X/week   Barriers to discharge        Co-evaluation               AM-PAC PT "6 Clicks" Mobility  Outcome Measure Help needed turning from your back to your side while in a flat bed without using bedrails?: A Little Help needed moving from lying on your back to sitting on the side of a flat bed without using bedrails?: A Lot Help needed moving to and from a bed to a chair (including a  wheelchair)?: A Little Help needed standing up from a chair using your arms (e.g., wheelchair or bedside chair)?: A Little Help needed to walk in hospital room?: A Little Help needed climbing 3-5 steps with a railing? : A Lot 6 Click Score: 16    End of Session Equipment Utilized During Treatment: Gait belt;Oxygen Activity Tolerance: Patient tolerated treatment well Patient left: in chair;with call bell/phone within reach;with family/visitor present (caregiver) Nurse Communication: Mobility status;Other (comment) (O2) PT Visit Diagnosis: Other abnormalities of gait and mobility (R26.89);Muscle weakness (generalized) (M62.81);Difficulty in walking, not elsewhere classified (R26.2)    Time: 2725-3664 PT Time Calculation (min) (ACUTE ONLY): 21 min   Charges:   PT Evaluation $PT Eval Low Complexity: 1 Low           Tori Alyze Lauf PT, DPT 12/20/20, 12:06 PM

## 2020-12-20 NOTE — Progress Notes (Addendum)
PROGRESS NOTE    Sabrina [name]  ZTI:[id number] DOB: 1926-08-03 DOA: 12/18/2020 PCP: [name], MD    Brief Narrative:   87 year old female with past medical history of paroxysmal atrial fibrillation, chronic diastolic heart failure, CKD stage IV, hypertension presented to the hospital with shortness of breath wheezing and cough.  She states that she has inhaler at home but was not able to use it with arthritis in her hands.  No formal diagnosis of COPD.  Patient was admitted hospital for possible acute bronchitis/COPD exacerbation with acute respiratory failure and hypoxia.  Was given IV steroids bronchodilators and antibiotic and was put on supplemental oxygen.      Assessment & Plan:   Active Problems:   Acute respiratory failure with hypoxia (HCC)   Chronic diastolic congestive heart failure (HCC)   CKD (chronic kidney disease), stage IV (HCC)   DM (diabetes mellitus) ([place])   Essential hypertension   Paroxysmal atrial fibrillation (HCC)   Dyspnea   Acute bronchitis/possible COPD. No formal diagnosis of COPD but was supposed to be on inhalers at home.  Still currently has wheezing shortness of breath and dyspnea.  Continue bronchodilators steroids and antibiotic.  Continue IV Solu-Medrol, azithromycin.  Acute respiratory failure with hypoxia Not on oxygen at home.  Currently on 3 L of oxygen by nasal cannula.  We will continue to wean as able.  Continue bronchodilators antibiotic   Chronic kidney disease stage IV Creatinine of 2.2 today.  Likely at baseline.  Chronic diastolic congestive heart failure Takes Lasix orally at home.  Will initiate IV Lasix while in the hospital.  Add strict intake and output charting.  Daily weights.  EKG with deep ST depressions. No chest pain.  2D echocardiogram showed normal LV function and mild pulmonary hypertension.  Due to advanced age and elevated creatinine it was felt that the patient would benefit from medical treatment.   Nitrates were added to her regimen.  Patient was also seen by cardiology during hospitalization.    Essential hypertension. Continue Norvasc, hydralazine.  We will continue to monitor blood pressure.  Hold off with clonidine for now.  Paroxysmal atrial fibrillation  -CHA2DS2-VASc of 7.  Continue Eliquis.  Heart rate is controlled at this time.  DM, type II Continue sliding scale insulin while in the hospital.  Patient is on glipizide at home.  Currently on hold.  Debility weakness.  Patient states that she walks with the help of a walker at home and lives alone and has caregivers.  We will get PT evaluation.  DVT prophylaxis: apixaban (ELIQUIS) tablet 2.5 mg Start: 12/19/20 1030  Code Status: DNR,   Family Communication: None  Disposition Plan: Status is: Inpatient  Remains inpatient appropriate because: Of need for bronchodilators steroids further monitoring and on supplemental oxygen.   Consultants:  Cardiology  Procedures:  None  Antimicrobials:  Azithromycin 10/18 >   Subjective: Today, Patient was seen and examined at bedside.  Patient complains of coughing wheezing but her shortness of breath has felt little better.  Denies any pain, nausea, fever or chills  Objective: Vitals:   12/19/20 2105 12/20/20 0544 12/20/20 0806 12/20/20 0808  BP: (!) 144/54 (!) 119/47    Pulse: 88 88    Resp: 20 17    Temp: 98.7 F (37.1 C) 98.5 F (36.9 C)    TempSrc: Oral Oral    SpO2: 97% 99% 97% 97%  Weight:      Height:       No intake or  output data in the 24 hours ending 12/20/20 0839  Filed Weights   12/18/20 1248  Weight: 78 kg   Body mass index is 35.95 kg/m.   Physical examination: General:  Average built, not in obvious distress, on nasal cannula oxygen, obese HENT:   No scleral pallor or icterus noted. Oral mucosa is moist.  Chest:    Diminished breath sounds bilaterally.  Rhonchi noted bilaterally. CVS: S1 &S2 heard. No murmur.  Regular rate and  rhythm. Abdomen: Soft, nontender, nondistended.  Bowel sounds are heard.   Extremities: No cyanosis, clubbing or edema.  Peripheral pulses are palpable. Psych: Alert, awake and communicative.  Normal mood CNS:  No cranial nerve deficits.  Power equal in all extremities.   Skin: Warm and dry.  No rashes noted.   Data Reviewed: I have personally reviewed following labs and imaging studies  CBC: Recent Labs  Lab 12/18/20 1230 12/19/20 0500 12/20/20 0531  WBC 8.9 7.1 17.5*  NEUTROABS 4.4  --   --   HGB 10.2* 9.8* 9.2*  HCT 32.2* 30.8* 29.2*  MCV 100.9* 101.7* 101.0*  PLT 198 186 242    Basic Metabolic Panel: Recent Labs  Lab 12/18/20 1230 12/19/20 0500 12/20/20 0531  NA 140 141 139  K 3.9 4.6 4.9  CL 101 100 100  CO2 25 32 32  GLUCOSE 191* 261* 225*  BUN 99* 101* 107*  CREATININE 2.30* 2.72* 2.29*  CALCIUM 10.2 10.5* 10.2    GFR: Estimated Creatinine Clearance: 14.1 mL/min (A) (by C-G formula based on SCr of 2.29 mg/dL (H)). Liver Function Tests: Recent Labs  Lab 12/18/20 1230 12/19/20 0500  AST 19 16  ALT 13 14  ALKPHOS 73 74  BILITOT 1.1 0.5  PROT 7.4 7.4  ALBUMIN 4.0 3.8    No results for input(s): LIPASE, AMYLASE in the last 168 hours. No results for input(s): AMMONIA in the last 168 hours. Coagulation Profile: No results for input(s): INR, PROTIME in the last 168 hours. Cardiac Enzymes: No results for input(s): CKTOTAL, CKMB, CKMBINDEX, TROPONINI in the last 168 hours. BNP (last 3 results) No results for input(s): PROBNP in the last 8760 hours. HbA1C: Recent Labs    12/18/20 1658  HGBA1C 7.1*    CBG: Recent Labs  Lab 12/19/20 0835 12/19/20 1200 12/19/20 1636 12/19/20 2202 12/20/20 0802  GLUCAP 210* 258* 217* 193* 189*    Lipid Profile: No results for input(s): CHOL, HDL, LDLCALC, TRIG, CHOLHDL, LDLDIRECT in the last 72 hours. Thyroid Function Tests: No results for input(s): TSH, T4TOTAL, FREET4, T3FREE, THYROIDAB in the last 72  hours. Anemia Panel: Recent Labs    12/19/20 0500  TIBC 343  IRON 27*    Sepsis Labs: Recent Labs  Lab 12/18/20 1713  PROCALCITON <0.10     Recent Results (from the past 240 hour(s))  Resp Panel by RT-PCR (Flu A&B, Covid) Nasopharyngeal Swab     Status: None   Collection Time: 12/18/20  1:21 PM   Specimen: Nasopharyngeal Swab; Nasopharyngeal(NP) swabs in vial transport medium  Result Value Ref Range Status   SARS Coronavirus 2 by RT PCR NEGATIVE NEGATIVE Final    Comment: (NOTE) SARS-CoV-2 target nucleic acids are NOT DETECTED.  The SARS-CoV-2 RNA is generally detectable in upper respiratory specimens during the acute phase of infection. The lowest concentration of SARS-CoV-2 viral copies this assay can detect is 138 copies/mL. A negative result does not preclude SARS-Cov-2 infection and should not be used as the sole basis for treatment or  other patient management decisions. A negative result may occur with  improper specimen collection/handling, submission of specimen other than nasopharyngeal swab, presence of viral mutation(s) within the areas targeted by this assay, and inadequate number of viral copies(<138 copies/mL). A negative result must be combined with clinical observations, patient history, and epidemiological information. The expected result is Negative.  Fact Sheet for Patients:  EntrepreneurPulse.com.au  Fact Sheet for Healthcare Providers:  IncredibleEmployment.be  This test is no t yet approved or cleared by the [place] FDA and  has been authorized for detection and/or diagnosis of SARS-CoV-2 by FDA under an Emergency Use Authorization (EUA). This EUA will remain  in effect (meaning this test can be used) for the duration of the COVID-19 declaration under Section 564(b)(1) of the Act, 21 U.S.C.section 360bbb-3(b)(1), unless the authorization is terminated  or revoked sooner.       Influenza A by PCR  NEGATIVE NEGATIVE Final   Influenza B by PCR NEGATIVE NEGATIVE Final    Comment: (NOTE) The Xpert Xpress SARS-CoV-2/FLU/RSV plus assay is intended as an aid in the diagnosis of influenza from Nasopharyngeal swab specimens and should not be used as a sole basis for treatment. Nasal washings and aspirates are unacceptable for Xpert Xpress SARS-CoV-2/FLU/RSV testing.  Fact Sheet for Patients: EntrepreneurPulse.com.au  Fact Sheet for Healthcare Providers: IncredibleEmployment.be  This test is not yet approved or cleared by the [place] FDA and has been authorized for detection and/or diagnosis of SARS-CoV-2 by FDA under an Emergency Use Authorization (EUA). This EUA will remain in effect (meaning this test can be used) for the duration of the COVID-19 declaration under Section 564(b)(1) of the Act, 21 U.S.C. section 360bbb-3(b)(1), unless the authorization is terminated or revoked.  Performed at [facility], [place] [address] [name],  [address]           Radiology Studies: CT CHEST WO CONTRAST  Result Date: 12/18/2020 CLINICAL DATA:  Respiratory failure EXAM: CT CHEST WITHOUT CONTRAST TECHNIQUE: Multidetector CT imaging of the chest was performed following the standard protocol without IV contrast. COMPARISON:  03/22/2015 FINDINGS: Cardiovascular: Dense mitral valve annular calcifications. Heart is mildly enlarged. Scattered coronary artery and aortic calcifications. No aneurysm. Mediastinum/Nodes: No mediastinal, hilar, or axillary adenopathy. Trachea and esophagus are unremarkable. Thyroid unremarkable. Lungs/Pleura: Airspace opacity peripherally in the right upper lobe and apex, stable since prior study compatible with scarring. Left lung clear. No effusions. Upper Abdomen: Gallstones noted within the gallbladder. No acute findings Musculoskeletal: Loop recorder device in the left chest wall. No acute bony  abnormality. IMPRESSION: Scarring in the right upper lobe/apex, unchanged since prior study. No acute cardiopulmonary disease. Coronary artery disease. Cholelithiasis. Aortic Atherosclerosis (ICD10-I70.0). Electronically Signed   By: Rolm Baptise M.D.   On: 12/18/2020 19:17   DG Chest Portable 1 View  Result Date: 12/18/2020 CLINICAL DATA:  Shortness of breath. EXAM: PORTABLE CHEST 1 VIEW COMPARISON:  06/28/2020 and CT chest 03/22/2015. FINDINGS: Trachea is midline. Heart size stable. Loop recorder projects over the left chest. Lungs are clear. No pleural fluid. IMPRESSION: No acute findings. Electronically Signed   By: Lorin Picket M.D.   On: 12/18/2020 13:08   ECHOCARDIOGRAM COMPLETE  Result Date: 12/19/2020    ECHOCARDIOGRAM REPORT   Patient Name:   TATANISHA CUTHBERT [facility] Date of Exam: 12/19/2020 Medical Rec #:  [id number]    Height:       58.0 in Accession #:    [id number]   Weight:  172.0 lb Date of Birth:  1926/12/16    BSA:          1.708 m Patient Age:    32 years     BP:           145/100 mmHg Patient Gender: F            HR:           81 bpm. Exam Location:  Inpatient Procedure: 2D Echo, Cardiac Doppler and Color Doppler Indications:    Syncope R55  History:        Patient has prior history of Echocardiogram examinations, most                 recent 05/27/2017. Aortic Valve Disease.  Sonographer:    Merrie Roof RDCS Referring Phys: [id number] [place]  1. Left ventricular ejection fraction, by estimation, is 60 to 65%. The left ventricle has normal function. The left ventricle has no regional wall motion abnormalities. There is mild left ventricular hypertrophy. Left ventricular diastolic parameters are consistent with Grade I diastolic dysfunction (impaired relaxation).  2. Right ventricular systolic function is normal. The right ventricular size is normal. There is mildly elevated pulmonary artery systolic pressure. The estimated right ventricular systolic pressure is 28.7 mmHg.  3.  Left atrial size was severely dilated.  4. The mitral valve is degenerative. No evidence of mitral valve regurgitation. Mild to moderate mitral stenosis. The mean mitral valve gradient is 4.0 mmHg with MVA 1.96 cm^2 by VTI. Severe mitral annular calcification.  5. The aortic valve was not well visualized. Aortic valve regurgitation is mild. Moderate aortic valve stenosis. Aortic valve area, by VTI measures 1.20 cm. Aortic valve mean gradient measures 25.0 mmHg.  6. The inferior vena cava is dilated in size with >50% respiratory variability, suggesting right atrial pressure of 8 mmHg. FINDINGS  Left Ventricle: Left ventricular ejection fraction, by estimation, is 60 to 65%. The left ventricle has normal function. The left ventricle has no regional wall motion abnormalities. The left ventricular internal cavity size was normal in size. There is  mild left ventricular hypertrophy. Left ventricular diastolic parameters are consistent with Grade I diastolic dysfunction (impaired relaxation). Right Ventricle: The right ventricular size is normal. No increase in right ventricular wall thickness. Right ventricular systolic function is normal. There is mildly elevated pulmonary artery systolic pressure. The tricuspid regurgitant velocity is 2.81  m/s, and with an assumed right atrial pressure of 8 mmHg, the estimated right ventricular systolic pressure is 86.7 mmHg. Left Atrium: Left atrial size was severely dilated. Right Atrium: Right atrial size was normal in size. Pericardium: There is no evidence of pericardial effusion. Mitral Valve: The mitral valve is degenerative in appearance. There is moderate thickening of the mitral valve leaflet(s). Severe mitral annular calcification. No evidence of mitral valve regurgitation. Mild to moderate mitral valve stenosis. The mean mitral valve gradient is 4.0 mmHg. Tricuspid Valve: The tricuspid valve is normal in structure. Tricuspid valve regurgitation is trivial. Aortic Valve:  The aortic valve was not well visualized. Aortic valve regurgitation is mild. Moderate aortic stenosis is present. Aortic valve mean gradient measures 25.0 mmHg. Aortic valve peak gradient measures 45.0 mmHg. Aortic valve area, by VTI measures 1.20 cm. Pulmonic Valve: The pulmonic valve was normal in structure. Pulmonic valve regurgitation is not visualized. Aorta: The aortic root is normal in size and structure. Venous: The inferior vena cava is dilated in size with greater than 50% respiratory variability, suggesting right  atrial pressure of 8 mmHg. IAS/Shunts: No atrial level shunt detected by color flow Doppler.  LEFT VENTRICLE PLAX 2D LVIDd:         3.90 cm   Diastology LVIDs:         2.70 cm   LV e' medial:    7.18 cm/s LV PW:         1.30 cm   LV E/e' medial:  18.5 LV IVS:        1.40 cm   LV e' lateral:   7.83 cm/s LVOT diam:     1.90 cm   LV E/e' lateral: 17.0 LV SV:         82 LV SV Index:   48 LVOT Area:     2.84 cm  RIGHT VENTRICLE          IVC RV Basal diam:  4.00 cm  IVC diam: 2.50 cm LEFT ATRIUM              Index        RIGHT ATRIUM           Index LA diam:        4.00 cm  2.34 cm/m   RA Area:     17.30 cm LA Vol (A2C):   127.0 ml 74.35 ml/m  RA Volume:   42.20 ml  24.71 ml/m LA Vol (A4C):   77.7 ml  45.49 ml/m LA Biplane Vol: 102.0 ml 59.72 ml/m  AORTIC VALVE AV Area (Vmax):    1.19 cm AV Area (Vmean):   1.17 cm AV Area (VTI):     1.20 cm AV Vmax:           335.50 cm/s AV Vmean:          229.500 cm/s AV VTI:            0.688 m AV Peak Grad:      45.0 mmHg AV Mean Grad:      25.0 mmHg LVOT Vmax:         141.00 cm/s LVOT Vmean:        94.700 cm/s LVOT VTI:          0.290 m LVOT/AV VTI ratio: 0.42  AORTA Ao Root diam: 3.20 cm MITRAL VALVE                TRICUSPID VALVE MV Area (PHT): 3.68 cm     TR Peak grad:   31.6 mmHg MV Area VTI:   1.96 cm     TR Vmax:        281.00 cm/s MV Mean grad:  4.0 mmHg MV VTI:        0.42 m       SHUNTS MV Decel Time: 206 msec     Systemic VTI:  0.29 m MV E  velocity: 133.00 cm/s  Systemic Diam: 1.90 cm MV A velocity: 166.00 cm/s MV E/A ratio:  0.80 Dalton McleanMD Electronically signed by Franki Monte Signature Date/Time: 12/19/2020/12:33:17 PM    Final         Scheduled Meds:  amLODipine  2.5 mg Oral Daily   apixaban  2.5 mg Oral BID   azithromycin  250 mg Oral Daily   budesonide (PULMICORT) nebulizer solution  0.25 mg Nebulization BID   famotidine  10 mg Oral QODAY   guaiFENesin  600 mg Oral BID   hydrALAZINE  50 mg Oral TID   insulin aspart  0-15 Units Subcutaneous TID WC  insulin aspart  0-5 Units Subcutaneous QHS   ipratropium  0.5 mg Nebulization Q6H   isosorbide mononitrate  30 mg Oral Daily   levalbuterol  0.63 mg Nebulization Q6H   methylPREDNISolone (SOLU-MEDROL) injection  40 mg Intravenous BID   polyethylene glycol  17 g Oral Daily   Continuous Infusions:   LOS: 1 day    Flora Lipps, MD [facility] If 7PM-7AM, please contact night-coverage [email or web address]  12/20/2020, 8:39 AM

## 2020-12-21 ENCOUNTER — Encounter: Payer: Self-pay | Admitting: Oncology

## 2020-12-21 LAB — GLUCOSE, CAPILLARY
Glucose-Capillary: 198 mg/dL — ABNORMAL HIGH (ref 70–99)
Glucose-Capillary: 331 mg/dL — ABNORMAL HIGH (ref 70–99)
Glucose-Capillary: 260 mg/dL — ABNORMAL HIGH (ref 70–99)
Glucose-Capillary: 195 mg/dL — ABNORMAL HIGH (ref 70–99)

## 2020-12-21 LAB — CBC
HCT: 34.2 % — ABNORMAL LOW (ref 36.0–46.0)
Hemoglobin: 10.8 g/dL — ABNORMAL LOW (ref 12.0–15.0)
MCH: 32.1 pg (ref 26.0–34.0)
MCHC: 31.6 g/dL (ref 30.0–36.0)
MCV: 101.8 fL — ABNORMAL HIGH (ref 80.0–100.0)
Platelets: 227 10*3/uL (ref 150–400)
RBC: 3.36 MIL/uL — ABNORMAL LOW (ref 3.87–5.11)
RDW: 14.8 % (ref 11.5–15.5)
WBC: 14.9 10*3/uL — ABNORMAL HIGH (ref 4.0–10.5)
nRBC: 0 % (ref 0.0–0.2)

## 2020-12-21 LAB — BASIC METABOLIC PANEL
Anion gap: 10 (ref 5–15)
BUN: 90 mg/dL — ABNORMAL HIGH (ref 8–23)
CO2: 33 mmol/L — ABNORMAL HIGH (ref 22–32)
Calcium: 10.4 mg/dL — ABNORMAL HIGH (ref 8.9–10.3)
Chloride: 100 mmol/L (ref 98–111)
Creatinine, Ser: 2.4 mg/dL — ABNORMAL HIGH (ref 0.44–1.00)
GFR, Estimated: 19 mL/min — ABNORMAL LOW (ref >60)
Glucose, Bld: 215 mg/dL — ABNORMAL HIGH (ref 70–99)
Potassium: 4.4 mmol/L (ref 3.5–5.1)
Sodium: 143 mmol/L (ref 135–145)

## 2020-12-21 LAB — MAGNESIUM: Magnesium: 2.7 mg/dL — ABNORMAL HIGH (ref 1.7–2.4)

## 2020-12-21 MED ORDER — CLONIDINE HCL 0.2 MG PO TABS
0.2000 mg | ORAL_TABLET | Freq: Two times a day (BID) | ORAL | Status: DC
  Administered 2020-12-21 – 2020-12-22 (×3): 0.2 mg via ORAL
  Filled 2020-12-21 (×3): qty 1

## 2020-12-21 MED ORDER — LEVALBUTEROL HCL 0.63 MG/3ML IN NEBU
0.6300 mg | INHALATION_SOLUTION | Freq: Two times a day (BID) | RESPIRATORY_TRACT | Status: DC
  Administered 2020-12-21 – 2020-12-22 (×2): 0.63 mg via RESPIRATORY_TRACT
  Filled 2020-12-21 (×2): qty 3

## 2020-12-21 MED ORDER — FUROSEMIDE 40 MG PO TABS
40.0000 mg | ORAL_TABLET | Freq: Every day | ORAL | Status: DC
  Administered 2020-12-22: 40 mg via ORAL
  Filled 2020-12-21: qty 1

## 2020-12-21 MED ORDER — PREDNISOLONE 5 MG PO TABS
40.0000 mg | ORAL_TABLET | Freq: Every day | ORAL | Status: DC
  Administered 2020-12-22: 40 mg via ORAL
  Filled 2020-12-21: qty 8

## 2020-12-21 MED ORDER — ALBUTEROL SULFATE HFA 108 (90 BASE) MCG/ACT IN AERS
1.0000 | INHALATION_SPRAY | Freq: Four times a day (QID) | RESPIRATORY_TRACT | Status: DC | PRN
Start: 2020-12-21 — End: 2020-12-21

## 2020-12-21 MED ORDER — IPRATROPIUM BROMIDE 0.02 % IN SOLN
0.5000 mg | Freq: Two times a day (BID) | RESPIRATORY_TRACT | Status: DC
  Administered 2020-12-21 – 2020-12-22 (×2): 0.5 mg via RESPIRATORY_TRACT
  Filled 2020-12-21 (×2): qty 2.5

## 2020-12-21 MED ORDER — ALBUTEROL SULFATE (2.5 MG/3ML) 0.083% IN NEBU: 2.5000 mg | INHALATION_SOLUTION | Freq: Four times a day (QID) | RESPIRATORY_TRACT | Status: DC | PRN

## 2020-12-21 NOTE — Evaluation (Signed)
Occupational Therapy Evaluation Patient Details Name: Sabrina [name] MRN: [id number] DOB: March 12, 1926 Today's Date: 12/21/2020   History of Present Illness Sabrina [name] is a 87 yo female who presents with SOB, wheezing and cough. Pt admitted with possible acute bronchitis/COPD exacerbation with acute resp failure and hypoxia. PMH: afib, HTN, CKD stage IV, CHF   Clinical Impression   Patient is currently requiring assistance with ADLs including maximum assist with toileting with use of BSC, maximum assist with LE dressing, moderate to maximum assist with bathing, as well as Min assist to setup assist with seated UE dressing, grooming and eating, some of which is below patient's typical baseline at home.  Pt does endorse CG assist with dressing and bathing as well as setup for seated grooming, but often performs toileting hygiene and functional mobility at a Modified independent level. Pt also endorsed 2 recent falls at home.  During this evaluation, patient was limited by generalized weakness, impaired activity tolerance with need of supplimental O2 at 2L, desaturating to 88% with bed mobility.   Patient lives alone but has caregivers during daylight hours with exception of shift change between 3-5pm.  Patient demonstrates good rehab potential, and should benefit from continued skilled occupational therapy services while in acute care to maximize safety, independence and quality of life at home.  Continued occupational therapy services in the home is recommended as long as pt has 24/7 supervision and assistance.  ?    Recommendations for follow up therapy are one component of a multi-disciplinary discharge planning process, led by the attending physician.  Recommendations may be updated based on patient status, additional functional criteria and insurance authorization.   Follow Up Recommendations  Home health OT;Supervision/Assistance - 24 hour    Equipment Recommendations  3 in 1 bedside  commode    Recommendations for Other Services       Precautions / Restrictions Precautions Precautions: Fall Restrictions Weight Bearing Restrictions: No      Mobility Bed Mobility Overal bed mobility: Needs Assistance Bed Mobility: Supine to Sit     Supine to sit: Min assist     General bed mobility comments: HOB elevated, VC for hand placement on bedrail to assist, Min A to upright trunk after pt struggled and scoot out to EOB with bedpad.    Transfers Overall transfer level: Needs assistance Equipment used: Rolling walker (2 wheeled) Transfers: Sit to/from Stand Sit to Stand: Min guard              Balance Overall balance assessment: Needs assistance Sitting-balance support: Feet supported Sitting balance-Leahy Scale: Fair Sitting balance - Comments: seated EOB   Standing balance support: During functional activity;Bilateral upper extremity supported Standing balance-Leahy Scale: Poor Standing balance comment: reliant on UE support                           ADL either performed or assessed with clinical judgement   ADL Overall ADL's : Needs assistance/impaired Eating/Feeding: Set up;Sitting   Grooming: Set up;Sitting Grooming Details (indicate cue type and reason): Pt performs grooming in chair at baseline with caregiver setup. Upper Body Bathing: Minimal assistance;Sitting   Lower Body Bathing: Maximal assistance;Sitting/lateral leans;Sit to/from stand   Upper Body Dressing : Minimal assistance;Sitting   Lower Body Dressing: Maximal assistance;Sitting/lateral leans;Sit to/from stand   Toilet Transfer: BSC;Min Film/video editor Details (indicate cue type and reason): Min As to descend to [facility] due to decreased eccentric control. Otherwise, Min Guard. Toileting- Clothing  Manipulation and Hygiene: Maximal assistance;Sit to/from stand Toileting - Clothing Manipulation Details (indicate cue type and reason): For peri care.      Functional mobility during ADLs: Min guard;Minimal assistance;Rolling walker       Vision Baseline Vision/History: 1 Wears glasses Ability to See in Adequate Light: 0 Adequate Vision Assessment?: No apparent visual deficits     Perception     Praxis      Pertinent Vitals/Pain Pain Assessment: No/denies pain ("Not right now")     Hand Dominance Right   Extremity/Trunk Assessment Upper Extremity Assessment Upper Extremity Assessment: Generalized weakness (OA to hands with subsequent impaired grip)   Lower Extremity Assessment Lower Extremity Assessment: Generalized weakness   Cervical / Trunk Assessment Cervical / Trunk Assessment: Kyphotic   Communication Communication Communication: Expressive difficulties;Other (comment) (Very slow speech with either delayed word finding vs processing.)   Cognition Arousal/Alertness: Awake/alert Behavior During Therapy: WFL for tasks assessed/performed Overall Cognitive Status: Within Functional Limits for tasks assessed                                 General Comments: h/o dementia per chart. Pt likes to remem   General Comments  Pt decreased SpO2 to 88% on 2L during transition from supine to sit. Otherwise, pt at or above 90% on 2L. HR: 122 with activity-108 at rest    Exercises     Shoulder Instructions      Home Living Family/patient expects to be discharged to:: Private residence Living Arrangements: Alone Available Help at Discharge: Personal care attendant (7 days/week 9am-3pm and 5pm-8pm) Type of Home: House Home Access: Ramped entrance     [place]: One level     Bathroom Shower/Tub: Occupational psychologist: [place] - single point;Walker - 2 wheels;Shower seat;Wheelchair - manual          Prior Functioning/Environment    Gait / Transfers Assistance Needed: RW for household ambulation, sleeps in chair with footstool ADL's / Homemaking Assistance  Needed: caregivers assist with bathing, dressing, and household chores; pt corrected that she does receive assistance for toileting.   Comments: not on home O2 at baseline        OT Problem List: Impaired UE functional use;Decreased knowledge of use of DME or AE;Impaired balance (sitting and/or standing);Decreased activity tolerance;Decreased coordination;Decreased strength;Cardiopulmonary status limiting activity      OT Treatment/Interventions: Self-care/ADL training;Therapeutic exercise;Therapeutic activities;Cognitive remediation/compensation;Energy conservation;Patient/family education;DME and/or AE instruction;Balance training    OT Goals(Current goals can be found in the care plan section) Acute Rehab OT Goals Patient Stated Goal: Go home OT Goal Formulation: With patient Time For Goal Achievement: 01/04/21 Potential to Achieve Goals: Good ADL Goals Pt Will Transfer to Toilet: with supervision;ambulating;regular height toilet Pt Will Perform Toileting - Clothing Manipulation and hygiene: with supervision;sitting/lateral leans;sit to/from stand;with adaptive equipment (CG/Pt will verbalize understanding to AE options for peri care.) Additional ADL Goal #1: Pt will improve sitting balance to good static and dynamic in order to increase safety and participation during seated ADLs. Additional ADL Goal #2: Patient will identify at least 3 energy conservation strategies and fall prevention strategies to employ at home in order to maximize function and quality of life and decrease caregiver burden while preventing exacerbation of symptoms and rehospitalization.  OT Frequency: Min 2X/week   Barriers to D/C:    Family is currently working to provide 24/7 caregiver assistance.  Co-evaluation              AM-PAC OT "6 Clicks" Daily Activity     Outcome Measure Help from another person eating meals?: A Little Help from another person taking care of personal grooming?: A  Little Help from another person toileting, which includes using toliet, bedpan, or urinal?: A Lot Help from another person bathing (including washing, rinsing, drying)?: A Lot Help from another person to put on and taking off regular upper body clothing?: A Little Help from another person to put on and taking off regular lower body clothing?: A Lot 6 Click Score: 15   End of Session Equipment Utilized During Treatment: Gait belt;Rolling walker Nurse Communication: Mobility status (Pt voided in BSC (Strict I&O order). Pt's vitals during session.)  Activity Tolerance: Patient tolerated treatment well (Limited by increased HR and need of supplimental O2) Patient left: in chair;with call bell/phone within reach;with chair alarm set;with nursing/sitter in room  OT Visit Diagnosis: History of falling (Z91.81);Unsteadiness on feet (R26.81);Muscle weakness (generalized) (M62.81)                Time: 8250-0370 OT Time Calculation (min): 42 min Charges:  OT General Charges $OT Visit: 1 Visit OT Evaluation $OT Eval Moderate Complexity: 1 Mod OT Treatments $Self Care/Home Management : 8-22 mins $Therapeutic Activity: 8-22 mins  Anderson Malta, OT Acute Rehab Services Office: [phone] 12/21/2020  Julien Girt 12/21/2020, 9:36 AM

## 2020-12-21 NOTE — Progress Notes (Signed)
PROGRESS NOTE    Sabrina [name]  DQQ:[id number] DOB: 1926/05/04 DOA: 12/18/2020 PCP: [name], MD    Brief Narrative:   87 year old female with past medical history of paroxysmal atrial fibrillation, chronic diastolic heart failure, CKD stage IV, hypertension presented to the hospital with shortness of breath wheezing and cough.  She states that she has inhaler at home but was not able to use it with arthritis in her hands.  No formal diagnosis of COPD.  Patient was admitted hospital for possible acute bronchitis/COPD exacerbation with acute respiratory failure and hypoxia.  Was given IV steroids bronchodilators and antibiotic and was put on supplemental oxygen.    Assessment & Plan:   Active Problems:   Acute respiratory failure with hypoxia (HCC)   Chronic diastolic congestive heart failure (HCC)   CKD (chronic kidney disease), stage IV (HCC)   DM (diabetes mellitus) (HCC)   Essential hypertension   Paroxysmal atrial fibrillation (HCC)   Dyspnea   Nonspecific abnormal electrocardiogram (ECG) (EKG)   Acute bronchitis/possible COPD. No formal diagnosis of COPD but was supposed to be on inhalers at home.  Wheezing has improved with the steroids.  We will change IV steroids to oral.  Continue azithromycin.  We will get oxygen desaturation study in AM.  Might need oxygen on discharge  Acute respiratory failure with hypoxia Not on oxygen at home.  Currently, on 2 L of oxygen by nasal cannula.  We will continue to wean as able.  Continue bronchodilators, azithromycin.  Patient might need oxygen on discharge.  Chronic kidney disease stage IV Creatinine of 2.2 again today.  Will change to oral Lasix.  Likely at baseline.  Chronic diastolic congestive heart failure Takes Lasix orally at home.  Received IV Lasix during the hospital.  Will change to oral starting today.  Continue strict intake and output charting.  Daily weights.  EKG with deep ST depressions. No chest pain.  2D  echocardiogram showed normal LV function and mild pulmonary hypertension.  Due to advanced age and elevated creatinine it was felt that the patient would benefit from medical treatment.  Imdur was added to the regimen.  Patient was also seen by cardiology during hospitalization.    Essential hypertension. Continue Norvasc, hydralazine.  We will continue to monitor blood pressure.  Will resume clonidine due to hypotension and mild tachycardia.  Paroxysmal atrial fibrillation  -CHA2DS2-VASc of 7.  Continue Eliquis.  Mild tachycardia but overall controlled.  DM, type II  Patient is on glipizide at home.  Currently on hold.  Continue sliding scale insulin Accu-Cheks diabetic diet.  Debility, weakness.  Patient states that she walks with the help of a walker at home and lives alone and has caregivers.  Physical therapy has seen the patient at this time and recommended skilled nursing facility placement.  I discussed this with her but she wishes to go home with caregivers.  DVT prophylaxis: apixaban (ELIQUIS) tablet 2.5 mg Start: 12/19/20 1030  Code Status: DNR  Family Communication: Spoke with the patient's caregiver at bedside  Disposition Plan: Status is: Inpatient  Remains inpatient appropriate because:  need for bronchodilators, steroids further monitoring and on supplemental oxygen.  Consultants:  Cardiology  Procedures:  None  Antimicrobials:  Azithromycin 10/18 >  Subjective: Today, patient was seen and examined at bedside.  Patient denies any nausea or vomiting and has cough.  Decreased shortness of breath  Objective: Vitals:   12/21/20 0654 12/21/20 0901 12/21/20 0926 12/21/20 1027  BP: (!) 176/68   [name])  146/59  Pulse: (!) 115 (!) 115  100  Resp: 17   16  Temp: 99.4 F (37.4 C)   98.7 F (37.1 C)  TempSrc: Oral   Oral  SpO2: 95% 96% 95% 98%  Weight:      Height:        Intake/Output Summary (Last 24 hours) at 12/21/2020 1331 Last data filed at 12/20/2020  1700 Gross per 24 hour  Intake 100 ml  Output --  Net 100 ml    Filed Weights   12/18/20 1248  Weight: 78 kg   Body mass index is 35.95 kg/m.   Physical examination: General: Obese not in obvious distress, on nasal cannula oxygen, HENT:   No scleral pallor or icterus noted. Oral mucosa is moist.  Chest:    Diminished breath sounds bilaterally.  CVS: S1 &S2 heard. No murmur.  Regular rate and rhythm. Abdomen: Soft, nontender, nondistended.  Bowel sounds are heard.   Extremities: No cyanosis, clubbing or edema.  Peripheral pulses are palpable. Psych: Alert, awake and communicative.  Normal mood CNS:  No cranial nerve deficits.  Power equal in all extremities.   Skin: Warm and dry.  No rashes noted.  Data Reviewed: I have personally reviewed following labs and imaging studies  CBC: Recent Labs  Lab 12/18/20 1230 12/19/20 0500 12/20/20 0531 12/21/20 0543  WBC 8.9 7.1 17.5* 14.9*  NEUTROABS 4.4  --   --   --   HGB 10.2* 9.8* 9.2* 10.8*  HCT 32.2* 30.8* 29.2* 34.2*  MCV 100.9* 101.7* 101.0* 101.8*  PLT 198 186 199 001    Basic Metabolic Panel: Recent Labs  Lab 12/18/20 1230 12/19/20 0500 12/20/20 0531 12/21/20 0543  NA 140 141 139 143  K 3.9 4.6 4.9 4.4  CL 101 100 100 100  CO2 25 32 32 33*  GLUCOSE 191* 261* 225* 215*  BUN 99* 101* 107* 90*  CREATININE 2.30* 2.72* 2.29* 2.40*  CALCIUM 10.2 10.5* 10.2 10.4*  MG  --   --   --  2.7*    GFR: Estimated Creatinine Clearance: 13.4 mL/min (A) (by C-G formula based on SCr of 2.4 mg/dL (H)). Liver Function Tests: Recent Labs  Lab 12/18/20 1230 12/19/20 0500  AST 19 16  ALT 13 14  ALKPHOS 73 74  BILITOT 1.1 0.5  PROT 7.4 7.4  ALBUMIN 4.0 3.8    No results for input(s): LIPASE, AMYLASE in the last 168 hours. No results for input(s): AMMONIA in the last 168 hours. Coagulation Profile: No results for input(s): INR, PROTIME in the last 168 hours. Cardiac Enzymes: No results for input(s): CKTOTAL, CKMB,  CKMBINDEX, TROPONINI in the last 168 hours. BNP (last 3 results) No results for input(s): PROBNP in the last 8760 hours. HbA1C: Recent Labs    12/18/20 1658  HGBA1C 7.1*    CBG: Recent Labs  Lab 12/20/20 1207 12/20/20 1654 12/20/20 2118 12/21/20 0744 12/21/20 1214  GLUCAP 233* 173* 188* 198* 331*    Lipid Profile: No results for input(s): CHOL, HDL, LDLCALC, TRIG, CHOLHDL, LDLDIRECT in the last 72 hours. Thyroid Function Tests: No results for input(s): TSH, T4TOTAL, FREET4, T3FREE, THYROIDAB in the last 72 hours. Anemia Panel: Recent Labs    12/19/20 0500  TIBC 343  IRON 27*    Sepsis Labs: Recent Labs  Lab 12/18/20 1713  PROCALCITON <0.10     Recent Results (from the past 240 hour(s))  Resp Panel by RT-PCR (Flu A&B, Covid) Nasopharyngeal Swab  Status: None   Collection Time: 12/18/20  1:21 PM   Specimen: Nasopharyngeal Swab; Nasopharyngeal(NP) swabs in vial transport medium  Result Value Ref Range Status   SARS Coronavirus 2 by RT PCR NEGATIVE NEGATIVE Final    Comment: (NOTE) SARS-CoV-2 target nucleic acids are NOT DETECTED.  The SARS-CoV-2 RNA is generally detectable in upper respiratory specimens during the acute phase of infection. The lowest concentration of SARS-CoV-2 viral copies this assay can detect is 138 copies/mL. A negative result does not preclude SARS-Cov-2 infection and should not be used as the sole basis for treatment or other patient management decisions. A negative result may occur with  improper specimen collection/handling, submission of specimen other than nasopharyngeal swab, presence of viral mutation(s) within the areas targeted by this assay, and inadequate number of viral copies(<138 copies/mL). A negative result must be combined with clinical observations, patient history, and epidemiological information. The expected result is Negative.  Fact Sheet for Patients:  EntrepreneurPulse.com.au  Fact Sheet  for Healthcare Providers:  IncredibleEmployment.be  This test is no t yet approved or cleared by the [place] FDA and  has been authorized for detection and/or diagnosis of SARS-CoV-2 by FDA under an Emergency Use Authorization (EUA). This EUA will remain  in effect (meaning this test can be used) for the duration of the COVID-19 declaration under Section 564(b)(1) of the Act, 21 U.S.C.section 360bbb-3(b)(1), unless the authorization is terminated  or revoked sooner.       Influenza A by PCR NEGATIVE NEGATIVE Final   Influenza B by PCR NEGATIVE NEGATIVE Final    Comment: (NOTE) The Xpert Xpress SARS-CoV-2/FLU/RSV plus assay is intended as an aid in the diagnosis of influenza from Nasopharyngeal swab specimens and should not be used as a sole basis for treatment. Nasal washings and aspirates are unacceptable for Xpert Xpress SARS-CoV-2/FLU/RSV testing.  Fact Sheet for Patients: EntrepreneurPulse.com.au  Fact Sheet for Healthcare Providers: IncredibleEmployment.be  This test is not yet approved or cleared by the [place] FDA and has been authorized for detection and/or diagnosis of SARS-CoV-2 by FDA under an Emergency Use Authorization (EUA). This EUA will remain in effect (meaning this test can be used) for the duration of the COVID-19 declaration under Section 564(b)(1) of the Act, 21 U.S.C. section 360bbb-3(b)(1), unless the authorization is terminated or revoked.  Performed at [facility], [place] [address] [place], [place] [address]      Radiology Studies: No results found.  Scheduled Meds:  allopurinol  100 mg Oral Daily   amLODipine  2.5 mg Oral Daily   apixaban  2.5 mg Oral BID   azithromycin  250 mg Oral Daily   budesonide (PULMICORT) nebulizer solution  0.25 mg Nebulization BID   cholecalciferol  2,000 Units Oral Daily   cloNIDine  0.2 mg Oral BID   famotidine  10 mg Oral QODAY    furosemide  40 mg Intravenous Daily   gabapentin  200 mg Oral BID AC   gabapentin  300 mg Oral QHS   guaiFENesin  600 mg Oral BID   hydrALAZINE  50 mg Oral TID   insulin aspart  0-15 Units Subcutaneous TID WC   insulin aspart  0-5 Units Subcutaneous QHS   ipratropium  0.5 mg Nebulization BID   isosorbide mononitrate  30 mg Oral Daily   levalbuterol  0.63 mg Nebulization BID   loratadine  10 mg Oral Daily   methylPREDNISolone (SOLU-MEDROL) injection  40 mg Intravenous BID   polyethylene glycol  17  g Oral Daily   Continuous Infusions:   LOS: 2 days    Flora Lipps, MD [facility] If 7PM-7AM, please contact night-coverage [email or web address]  12/21/2020, 1:31 PM

## 2020-12-21 NOTE — Progress Notes (Signed)
   12/21/20 0654  Assess: MEWS Score  Temp 99.4 F (37.4 C)  BP (!) 176/68  Pulse Rate (!) 115  Resp 17  SpO2 95 %  O2 Device Nasal Cannula  O2 Flow Rate (L/min) 3 L/min  Assess: MEWS Score  MEWS Temp 0  MEWS Systolic 0  MEWS Pulse 2  MEWS RR 0  MEWS LOC 0  MEWS Score 2  MEWS Score Color Yellow  Assess: if the MEWS score is Yellow or Red  Were vital signs taken at a resting state? Yes  Focused Assessment No change from prior assessment  Does the patient meet 2 or more of the SIRS criteria? No  MEWS guidelines implemented *See Row Information* Yes  Treat  Pain Scale 0-10  Pain Score 0  Take Vital Signs  Increase Vital Sign Frequency  Yellow: Q 2hr X 2 then Q 4hr X 2, if remains yellow, continue Q 4hrs  Escalate  MEWS: Escalate Yellow: discuss with charge nurse/RN and consider discussing with provider and RRT  Notify: Charge Nurse/RN  Name of Charge Nurse/RN Notified [place], RN  Date Charge Nurse/RN Notified 12/21/20  Time Charge Nurse/RN Notified 0654  Notify: Provider  Provider Name/Title Pokhrel  Date Provider Notified 12/21/20  Time Provider Notified [phone]  Notification Type Page  Notification Reason Change in status  Assess: SIRS CRITERIA  SIRS Temperature  0  SIRS Pulse 1  SIRS Respirations  0  SIRS WBC 0  SIRS Score Sum  1

## 2020-12-21 NOTE — Progress Notes (Signed)
SATURATION QUALIFICATIONS: (This note is used to comply with regulatory documentation for home oxygen)  Patient Saturations on Room Air at Rest = 90%  Patient Saturations on Room Air while Ambulating = 84%  Patient Saturations on 3 Liters of oxygen while Ambulating = 90%  Please briefly explain why patient needs home oxygen: Pt gets SOB on exertion and desats as noted above

## 2020-12-22 LAB — BASIC METABOLIC PANEL
Anion gap: 8 (ref 5–15)
BUN: 106 mg/dL — ABNORMAL HIGH (ref 8–23)
CO2: 35 mmol/L — ABNORMAL HIGH (ref 22–32)
Calcium: 9.8 mg/dL (ref 8.9–10.3)
Chloride: 101 mmol/L (ref 98–111)
Creatinine, Ser: 2.67 mg/dL — ABNORMAL HIGH (ref 0.44–1.00)
GFR, Estimated: 16 mL/min — ABNORMAL LOW (ref >60)
Glucose, Bld: 193 mg/dL — ABNORMAL HIGH (ref 70–99)
Potassium: 4.3 mmol/L (ref 3.5–5.1)
Sodium: 144 mmol/L (ref 135–145)

## 2020-12-22 LAB — CBC
HCT: 28.9 % — ABNORMAL LOW (ref 36.0–46.0)
Hemoglobin: 9.2 g/dL — ABNORMAL LOW (ref 12.0–15.0)
MCH: 32.5 pg (ref 26.0–34.0)
MCHC: 31.8 g/dL (ref 30.0–36.0)
MCV: 102.1 fL — ABNORMAL HIGH (ref 80.0–100.0)
Platelets: 188 10*3/uL (ref 150–400)
RBC: 2.83 MIL/uL — ABNORMAL LOW (ref 3.87–5.11)
RDW: 15 % (ref 11.5–15.5)
WBC: 11.5 10*3/uL — ABNORMAL HIGH (ref 4.0–10.5)
nRBC: 0 % (ref 0.0–0.2)

## 2020-12-22 LAB — MAGNESIUM: Magnesium: 2.6 mg/dL — ABNORMAL HIGH (ref 1.7–2.4)

## 2020-12-22 LAB — GLUCOSE, CAPILLARY: Glucose-Capillary: 177 mg/dL — ABNORMAL HIGH (ref 70–99)

## 2020-12-22 MED ORDER — PREDNISONE 10 MG (21) PO TBPK
ORAL_TABLET | ORAL | 0 refills | Status: DC
Start: 2020-12-22 — End: 2021-02-07

## 2020-12-22 MED ORDER — ISOSORBIDE MONONITRATE ER 30 MG PO TB24: 30.0000 mg | ORAL_TABLET | Freq: Every day | ORAL | 2 refills | Status: DC

## 2020-12-22 MED ORDER — GUAIFENESIN ER 600 MG PO TB12: 600.0000 mg | ORAL_TABLET | Freq: Two times a day (BID) | ORAL | 0 refills | Status: AC

## 2020-12-22 NOTE — Discharge Summary (Signed)
Physician Discharge Summary  [name] FVC:[id number] DOB: 10-19-1926 DOA: 12/18/2020  PCP: [name], MD  Admit date: 12/18/2020 Discharge date: 12/22/2020  Admitted From: Home  Discharge disposition: Home health  Recommendations for Outpatient Follow-Up:   Follow up with your primary care provider in one week.  Check CBC, BMP, magnesium in the next visit  Discharge Diagnosis:   Active Problems:   Acute respiratory failure with hypoxia (HCC)   Chronic diastolic congestive heart failure (HCC)   CKD (chronic kidney disease), stage IV (HCC)   DM (diabetes mellitus) ([place])   Essential hypertension   Paroxysmal atrial fibrillation (HCC)   Dyspnea   Nonspecific abnormal electrocardiogram (ECG) (EKG)   Discharge Condition: Improved.  Diet recommendation: Low sodium, heart healthy.  Carbohydrate-modified.    Wound care: None.  Code status: Full.   History of Present Illness:   87 year old female with past medical history of paroxysmal atrial fibrillation, chronic diastolic heart failure, CKD stage IV, hypertension presented to the hospital with shortness of breath, wheezing and cough.  She states that she has inhaler at home but was not able to use it with arthritis in her hands.  No formal diagnosis of COPD.  Patient was admitted hospital for possible acute bronchitis/COPD exacerbation with acute respiratory failure and hypoxia.   Hospital Course:   Following conditions were addressed during hospitalization as listed below,  Acute bronchitis/possible COPD. No formal diagnosis of COPD but was supposed to be on inhalers at home.  Wheezing has improved with the steroids.  We will continue oral prednisone on discharge.  Received empiric antibiotic during hospitalization.  Desaturation study was performed and patient has qualified for home oxygen on discharge.   Acute respiratory failure with hypoxia Not on oxygen at home.  Currently, on 3 L of oxygen by nasal  cannula.  Patient remained hypoxic on discharge yesterday so will be prescribed oxygen on discharge.  Continue albuterol inhaler on discharge   Chronic kidney disease stage IV Creatinine around 2.6.  Will be resumed on home oral Lasix on discharge.   Chronic diastolic congestive heart failure Deceived IV Lasix during hospitalization.  Will change to oral on discharge    EKG with deep ST depressions. No chest pain.  2D echocardiogram showed normal LV function and mild pulmonary hypertension.  Due to advanced age and elevated creatinine, it was felt that the patient would benefit from medical treatment.  Imdur was added to the regimen.  Patient was also seen by cardiology during hospitalization.  Imdur will be continued on discharge.   Essential hypertension. Continue Norvasc, hydralazine, Imdur, clonidine on discharge  Paroxysmal atrial fibrillation  -CHA2DS2-VASc of 7.  Continue Eliquis.  Rate control prior to discharge   DM, type II Will be resumed on glipizide on discharge   Debility, generalized weakness.  Patient states that she walks with the help of a walker at home and lives alone and has caregivers.  Physical therapy has seen the patient at this time and recommended skilled nursing facility placement.  I discussed this with her and the family but patient wishes to go home with her caregivers.    Disposition.  At this time, patient is stable for disposition home with home health.  Patient will follow-up with her primary care physician and cardiology as outpatient.  Spoke with the patient's son prior to disposition.  Medical Consultants:   Cardiology  Procedures:    None Subjective:   Today, patient was seen and examined at bedside.  Denies increasing  dyspnea chest pain fever or chills.  Requiring supplemental oxygen.  Discharge Exam:   Vitals:   12/22/20 0449 12/22/20 0818  BP: (!) 122/49   Pulse: 77   Resp: 16   Temp: 98.4 F (36.9 C)   SpO2: 96% 97%   Vitals:    12/21/20 2103 12/22/20 0449 12/22/20 0530 12/22/20 0818  BP: (!) 162/62 (!) 122/49    Pulse: (!) 109 77    Resp: 14 16    Temp: 99.5 F (37.5 C) 98.4 F (36.9 C)    TempSrc: Oral Oral    SpO2: 98% 96%  97%  Weight:   74.1 kg   Height:       General: Alert awake, not in obvious distress, obese, on nasal cannula oxygen HENT: pupils equally reacting to light,  No scleral pallor or icterus noted. Oral mucosa is moist.  Chest:   Diminished breath sounds bilaterally.  CVS: S1 &S2 heard. No murmur.  Regular rate and rhythm. Abdomen: Soft, nontender, nondistended.  Bowel sounds are heard.   Extremities: No cyanosis, clubbing or edema.  Peripheral pulses are palpable. Psych: Alert, awake and oriented, normal mood CNS:  No cranial nerve deficits.  Power equal in all extremities.   Skin: Warm and dry.  No rashes noted.  The results of significant diagnostics from this hospitalization (including imaging, microbiology, ancillary and laboratory) are listed below for reference.     Diagnostic Studies:   CT CHEST WO CONTRAST  Result Date: 12/18/2020 CLINICAL DATA:  Respiratory failure EXAM: CT CHEST WITHOUT CONTRAST TECHNIQUE: Multidetector CT imaging of the chest was performed following the standard protocol without IV contrast. COMPARISON:  03/22/2015 FINDINGS: Cardiovascular: Dense mitral valve annular calcifications. Heart is mildly enlarged. Scattered coronary artery and aortic calcifications. No aneurysm. Mediastinum/Nodes: No mediastinal, hilar, or axillary adenopathy. Trachea and esophagus are unremarkable. Thyroid unremarkable. Lungs/Pleura: Airspace opacity peripherally in the right upper lobe and apex, stable since prior study compatible with scarring. Left lung clear. No effusions. Upper Abdomen: Gallstones noted within the gallbladder. No acute findings Musculoskeletal: Loop recorder device in the left chest wall. No acute bony abnormality. IMPRESSION: Scarring in the right upper  lobe/apex, unchanged since prior study. No acute cardiopulmonary disease. Coronary artery disease. Cholelithiasis. Aortic Atherosclerosis (ICD10-I70.0). Electronically Signed   By: Rolm Baptise M.D.   On: 12/18/2020 19:17   DG Chest Portable 1 View  Result Date: 12/18/2020 CLINICAL DATA:  Shortness of breath. EXAM: PORTABLE CHEST 1 VIEW COMPARISON:  06/28/2020 and CT chest 03/22/2015. FINDINGS: Trachea is midline. Heart size stable. Loop recorder projects over the left chest. Lungs are clear. No pleural fluid. IMPRESSION: No acute findings. Electronically Signed   By: Lorin Picket M.D.   On: 12/18/2020 13:08   ECHOCARDIOGRAM COMPLETE  Result Date: 12/19/2020    ECHOCARDIOGRAM REPORT   Patient Name:   Sabrina [name] [facility] Date of Exam: 12/19/2020 Medical Rec #:  [id number]    Height:       58.0 in Accession #:    [id number]   Weight:       172.0 lb Date of Birth:  10/04/1926    BSA:          1.708 m Patient Age:    26 years     BP:           145/100 mmHg Patient Gender: F            HR:           81 bpm. Exam  Location:  Inpatient Procedure: 2D Echo, Cardiac Doppler and Color Doppler Indications:    Syncope R55  History:        Patient has prior history of Echocardiogram examinations, most                 recent 05/27/2017. Aortic Valve Disease.  Sonographer:    Merrie Roof RDCS Referring Phys: [id number] [place]  1. Left ventricular ejection fraction, by estimation, is 60 to 65%. The left ventricle has normal function. The left ventricle has no regional wall motion abnormalities. There is mild left ventricular hypertrophy. Left ventricular diastolic parameters are consistent with Grade I diastolic dysfunction (impaired relaxation).  2. Right ventricular systolic function is normal. The right ventricular size is normal. There is mildly elevated pulmonary artery systolic pressure. The estimated right ventricular systolic pressure is 62.2 mmHg.  3. Left atrial size was severely dilated.  4. The mitral  valve is degenerative. No evidence of mitral valve regurgitation. Mild to moderate mitral stenosis. The mean mitral valve gradient is 4.0 mmHg with MVA 1.96 cm^2 by VTI. Severe mitral annular calcification.  5. The aortic valve was not well visualized. Aortic valve regurgitation is mild. Moderate aortic valve stenosis. Aortic valve area, by VTI measures 1.20 cm. Aortic valve mean gradient measures 25.0 mmHg.  6. The inferior vena cava is dilated in size with >50% respiratory variability, suggesting right atrial pressure of 8 mmHg. FINDINGS  Left Ventricle: Left ventricular ejection fraction, by estimation, is 60 to 65%. The left ventricle has normal function. The left ventricle has no regional wall motion abnormalities. The left ventricular internal cavity size was normal in size. There is  mild left ventricular hypertrophy. Left ventricular diastolic parameters are consistent with Grade I diastolic dysfunction (impaired relaxation). Right Ventricle: The right ventricular size is normal. No increase in right ventricular wall thickness. Right ventricular systolic function is normal. There is mildly elevated pulmonary artery systolic pressure. The tricuspid regurgitant velocity is 2.81  m/s, and with an assumed right atrial pressure of 8 mmHg, the estimated right ventricular systolic pressure is 29.7 mmHg. Left Atrium: Left atrial size was severely dilated. Right Atrium: Right atrial size was normal in size. Pericardium: There is no evidence of pericardial effusion. Mitral Valve: The mitral valve is degenerative in appearance. There is moderate thickening of the mitral valve leaflet(s). Severe mitral annular calcification. No evidence of mitral valve regurgitation. Mild to moderate mitral valve stenosis. The mean mitral valve gradient is 4.0 mmHg. Tricuspid Valve: The tricuspid valve is normal in structure. Tricuspid valve regurgitation is trivial. Aortic Valve: The aortic valve was not well visualized. Aortic valve  regurgitation is mild. Moderate aortic stenosis is present. Aortic valve mean gradient measures 25.0 mmHg. Aortic valve peak gradient measures 45.0 mmHg. Aortic valve area, by VTI measures 1.20 cm. Pulmonic Valve: The pulmonic valve was normal in structure. Pulmonic valve regurgitation is not visualized. Aorta: The aortic root is normal in size and structure. Venous: The inferior vena cava is dilated in size with greater than 50% respiratory variability, suggesting right atrial pressure of 8 mmHg. IAS/Shunts: No atrial level shunt detected by color flow Doppler.  LEFT VENTRICLE PLAX 2D LVIDd:         3.90 cm   Diastology LVIDs:         2.70 cm   LV e' medial:    7.18 cm/s LV PW:         1.30 cm   LV E/e' medial:  18.5  LV IVS:        1.40 cm   LV e' lateral:   7.83 cm/s LVOT diam:     1.90 cm   LV E/e' lateral: 17.0 LV SV:         82 LV SV Index:   48 LVOT Area:     2.84 cm  RIGHT VENTRICLE          IVC RV Basal diam:  4.00 cm  IVC diam: 2.50 cm LEFT ATRIUM              Index        RIGHT ATRIUM           Index LA diam:        4.00 cm  2.34 cm/m   RA Area:     17.30 cm LA Vol (A2C):   127.0 ml 74.35 ml/m  RA Volume:   42.20 ml  24.71 ml/m LA Vol (A4C):   77.7 ml  45.49 ml/m LA Biplane Vol: 102.0 ml 59.72 ml/m  AORTIC VALVE AV Area (Vmax):    1.19 cm AV Area (Vmean):   1.17 cm AV Area (VTI):     1.20 cm AV Vmax:           335.50 cm/s AV Vmean:          229.500 cm/s AV VTI:            0.688 m AV Peak Grad:      45.0 mmHg AV Mean Grad:      25.0 mmHg LVOT Vmax:         141.00 cm/s LVOT Vmean:        94.700 cm/s LVOT VTI:          0.290 m LVOT/AV VTI ratio: 0.42  AORTA Ao Root diam: 3.20 cm MITRAL VALVE                TRICUSPID VALVE MV Area (PHT): 3.68 cm     TR Peak grad:   31.6 mmHg MV Area VTI:   1.96 cm     TR Vmax:        281.00 cm/s MV Mean grad:  4.0 mmHg MV VTI:        0.42 m       SHUNTS MV Decel Time: 206 msec     Systemic VTI:  0.29 m MV E velocity: 133.00 cm/s  Systemic Diam: 1.90 cm MV A  velocity: 166.00 cm/s MV E/A ratio:  0.80 Dalton McleanMD Electronically signed by Franki Monte Signature Date/Time: 12/19/2020/12:33:17 PM    Final     Labs:   Basic Metabolic Panel: Recent Labs  Lab 12/18/20 1230 12/19/20 0500 12/20/20 0531 12/21/20 0543 12/22/20 0554  NA 140 141 139 143 144  K 3.9 4.6 4.9 4.4 4.3  CL 101 100 100 100 101  CO2 25 32 32 33* 35*  GLUCOSE 191* 261* 225* 215* 193*  BUN 99* 101* 107* 90* 106*  CREATININE 2.30* 2.72* 2.29* 2.40* 2.67*  CALCIUM 10.2 10.5* 10.2 10.4* 9.8  MG  --   --   --  2.7* 2.6*   GFR Estimated Creatinine Clearance: 11.7 mL/min (A) (by C-G formula based on SCr of 2.67 mg/dL (H)). Liver Function Tests: Recent Labs  Lab 12/18/20 1230 12/19/20 0500  AST 19 16  ALT 13 14  ALKPHOS 73 74  BILITOT 1.1 0.5  PROT 7.4 7.4  ALBUMIN 4.0 3.8   No results for input(s): LIPASE,  AMYLASE in the last 168 hours. No results for input(s): AMMONIA in the last 168 hours. Coagulation profile No results for input(s): INR, PROTIME in the last 168 hours.  CBC: Recent Labs  Lab 12/18/20 1230 12/19/20 0500 12/20/20 0531 12/21/20 0543 12/22/20 0554  WBC 8.9 7.1 17.5* 14.9* 11.5*  NEUTROABS 4.4  --   --   --   --   HGB 10.2* 9.8* 9.2* 10.8* 9.2*  HCT 32.2* 30.8* 29.2* 34.2* 28.9*  MCV 100.9* 101.7* 101.0* 101.8* 102.1*  PLT 198 186 199 227 188   Cardiac Enzymes: No results for input(s): CKTOTAL, CKMB, CKMBINDEX, TROPONINI in the last 168 hours. BNP: Invalid input(s): POCBNP CBG: Recent Labs  Lab 12/21/20 0744 12/21/20 1214 12/21/20 1640 12/21/20 2225 12/22/20 0756  GLUCAP 198* 331* 260* 195* 177*   D-Dimer No results for input(s): DDIMER in the last 72 hours. Hgb A1c No results for input(s): HGBA1C in the last 72 hours. Lipid Profile No results for input(s): CHOL, HDL, LDLCALC, TRIG, CHOLHDL, LDLDIRECT in the last 72 hours. Thyroid function studies No results for input(s): TSH, T4TOTAL, T3FREE, THYROIDAB in the last 72  hours.  Invalid input(s): FREET3 Anemia work up No results for input(s): VITAMINB12, FOLATE, FERRITIN, TIBC, IRON, RETICCTPCT in the last 72 hours. Microbiology Recent Results (from the past 240 hour(s))  Resp Panel by RT-PCR (Flu A&B, Covid) Nasopharyngeal Swab     Status: None   Collection Time: 12/18/20  1:21 PM   Specimen: Nasopharyngeal Swab; Nasopharyngeal(NP) swabs in vial transport medium  Result Value Ref Range Status   SARS Coronavirus 2 by RT PCR NEGATIVE NEGATIVE Final    Comment: (NOTE) SARS-CoV-2 target nucleic acids are NOT DETECTED.  The SARS-CoV-2 RNA is generally detectable in upper respiratory specimens during the acute phase of infection. The lowest concentration of SARS-CoV-2 viral copies this assay can detect is 138 copies/mL. A negative result does not preclude SARS-Cov-2 infection and should not be used as the sole basis for treatment or other patient management decisions. A negative result may occur with  improper specimen collection/handling, submission of specimen other than nasopharyngeal swab, presence of viral mutation(s) within the areas targeted by this assay, and inadequate number of viral copies(<138 copies/mL). A negative result must be combined with clinical observations, patient history, and epidemiological information. The expected result is Negative.  Fact Sheet for Patients:  EntrepreneurPulse.com.au  Fact Sheet for Healthcare Providers:  IncredibleEmployment.be  This test is no t yet approved or cleared by the [place] FDA and  has been authorized for detection and/or diagnosis of SARS-CoV-2 by FDA under an Emergency Use Authorization (EUA). This EUA will remain  in effect (meaning this test can be used) for the duration of the COVID-19 declaration under Section 564(b)(1) of the Act, 21 U.S.C.section 360bbb-3(b)(1), unless the authorization is terminated  or revoked sooner.       Influenza A  by PCR NEGATIVE NEGATIVE Final   Influenza B by PCR NEGATIVE NEGATIVE Final    Comment: (NOTE) The Xpert Xpress SARS-CoV-2/FLU/RSV plus assay is intended as an aid in the diagnosis of influenza from Nasopharyngeal swab specimens and should not be used as a sole basis for treatment. Nasal washings and aspirates are unacceptable for Xpert Xpress SARS-CoV-2/FLU/RSV testing.  Fact Sheet for Patients: EntrepreneurPulse.com.au  Fact Sheet for Healthcare Providers: IncredibleEmployment.be  This test is not yet approved or cleared by the [place] FDA and has been authorized for detection and/or diagnosis of SARS-CoV-2 by FDA under an Emergency Use  Authorization (EUA). This EUA will remain in effect (meaning this test can be used) for the duration of the COVID-19 declaration under Section 564(b)(1) of the Act, 21 U.S.C. section 360bbb-3(b)(1), unless the authorization is terminated or revoked.  Performed at [facility], [place] [address] [place], [place] [address]      Discharge Instructions:   Discharge Instructions     Call MD for:  persistant nausea and vomiting   Complete by: As directed    Call MD for:  severe uncontrolled pain   Complete by: As directed    Call MD for:  temperature >100.4   Complete by: As directed    Diet Carb Modified   Complete by: As directed    Discharge instructions   Complete by: As directed    Complete the course of prednisone.  Continue to use oxygen.  Check with your primary care physician in 1 week.  No overexertion.  If you experience worsening symptoms please seek medical attention.   Increase activity slowly   Complete by: As directed       Allergies as of 12/22/2020       Reactions   Beta Adrenergic Blockers Other (See Comments)   Unknown        Medication List     STOP taking these medications    Pfizer COVID-19 Vac Bivalent injection Generic drug: COVID-19 mRNA  bivalent vaccine Therapist, music)   Pfizer-BioNTech COVID-19 Vacc 30 MCG/0.3ML injection Generic drug: COVID-19 mRNA vaccine (Pfizer)       TAKE these medications    albuterol 108 (90 Base) MCG/ACT inhaler Commonly known as: VENTOLIN HFA Inhale 1-2 puffs into the lungs every 6 (six) hours as needed for wheezing or shortness of breath.   allopurinol 100 MG tablet Commonly known as: ZYLOPRIM Take 100 mg by mouth daily.   amLODipine 2.5 MG tablet Commonly known as: NORVASC Take 2.5 mg by mouth daily.   cetirizine 10 MG tablet Commonly known as: ZYRTEC Take 10 mg by mouth daily.   cholecalciferol 1000 units tablet Commonly known as: VITAMIN D Take 2,000 Units by mouth daily.   cloNIDine 0.2 MG tablet Commonly known as: CATAPRES Take 0.2 mg by mouth 2 (two) times daily.   denosumab 60 MG/ML Sosy injection Commonly known as: PROLIA Inject 60 mg into the skin every 6 (six) months.   Eliquis 2.5 MG Tabs tablet Generic drug: apixaban TAKE 1 TABLET BY MOUTH TWICE DAILY. What changed: how much to take   famotidine 20 MG tablet Commonly known as: PEPCID Take 20 mg by mouth daily.   furosemide 40 MG tablet Commonly known as: LASIX Take 40 mg by mouth daily. Per Dr. If over 166lb take 80mg  a day   gabapentin 100 MG capsule Commonly known as: NEURONTIN Take 3 capsules (300 mg total) by mouth at bedtime. Take 200 mg by mouth twice a day and take 300 mg by mouth at bedtime What changed:  how much to take when to take this   glipiZIDE 5 MG tablet Commonly known as: GLUCOTROL Take 5 mg by mouth 2 (two) times daily before a meal.   guaiFENesin 600 MG 12 hr tablet Commonly known as: MUCINEX Take 1 tablet (600 mg total) by mouth 2 (two) times daily for 7 days.   hydrALAZINE 50 MG tablet Commonly known as: APRESOLINE Take 50 mg by mouth 3 (three) times daily. What changed: Another medication with the same name was removed. Continue taking this medication, and follow the  directions you see here.   hydrocortisone cream 1 % Apply 1 application topically daily as needed for itching.   isosorbide mononitrate 30 MG 24 hr tablet Commonly known as: IMDUR Take 1 tablet (30 mg total) by mouth daily.   MUCINEX DM PO Take 30 mLs by mouth daily as needed (cough).   polyethylene glycol 17 g packet Commonly known as: MIRALAX / GLYCOLAX Take 17 g by mouth daily.   predniSONE 10 MG (21) Tbpk tablet Commonly known as: STERAPRED UNI-PAK 21 TAB 3 tab orally daily x 2 days then 2 tab po daily x 2 days then 1 tab po daily x 2 days   traMADol 50 MG tablet Commonly known as: ULTRAM Take 50 mg by mouth 2 (two) times daily as needed for moderate pain.   True Metrix Blood Glucose Test test strip Generic drug: glucose blood   TRUEplus Lancets 28G Misc Apply topically.               Durable Medical Equipment  (From admission, onward)           Start     Ordered   12/22/20 0725  For home use only DME oxygen  Once       Question Answer Comment  Length of Need 6 Months   Mode or (Route) Nasal cannula   Liters per Minute 3   Frequency Continuous (stationary and portable oxygen unit needed)   Oxygen conserving device Yes   Oxygen delivery system Gas      12/22/20 0725            Follow-up Information     Stoneking, Hal, MD. Schedule an appointment as soon as possible for a visit in 1 week(s).   Specialty: Internal Medicine Contact information: [address] [place] [address] [phone]         Nahser, Wonda Cheng, MD .   Specialty: Cardiology Contact information: [place] [address] [phone]         [facility], [facility] Follow up.   Specialty: Home Health Services Why: agency will provide home health physical therapy. Contact information: [address] [place] [address] [phone]         [facility], [facility] Follow up.   Why:  agency will provide home oxygen Contact information: [address] [place] [address] [phone]                 Time coordinating discharge: 39 minutes  Signed:  Irisha Grandmaison  [facility] 12/22/2020, 10:12 AM

## 2020-12-22 NOTE — TOC Transition Note (Signed)
Transition of Care [facility]) - CM/SW Discharge Note   Patient Details  Name: Sabrina [name] MRN: [id number] Date of Birth: June 07, 1926  [facility] CM/SW Contact:  Joaquin Courts, RN Phone Number: 12/22/2020, 10:09 AM   Clinical Narrative:    CM spoke with patient regarding planned discharge for today.  Patient reports she plans to transport home by car.  [employer] rep sheila given referral for home oxygen, portable O2 tank to be delivered to bedside.  MD notfied of need for HHPT orders, services previously arranged with [facility].   Final next level of care: [place] Barriers to Discharge: No Barriers Identified   Patient Goals and CMS Choice Patient states their goals for this hospitalization and ongoing recovery are:: To go home CMS Medicare.gov Compare Post Acute Care list provided to:: Patient Choice offered to / list presented to : Patient  Discharge Placement                       Discharge Plan and Services   Discharge Planning Services: CM Consult Post Acute Care Choice: Home Health          DME Arranged: Oxygen DME Agency: [facility] Date DME Agency Contacted: 12/22/20 Time DME Agency Contacted: 2353 Representative spoke with at DME Agency: Freda Munro HH Arranged: PT [place]: [place] Date [place]: 12/20/20 Time [place]: 1501 Representative spoke with at [place]: [place] ([place]) Interventions     Readmission Risk Interventions Readmission Risk Prevention Plan 12/22/2020 12/20/2020  Transportation Screening Complete Complete  PCP or Specialist Appt within 5-7 Days Complete Complete  Home Care Screening Complete Complete  Medication Review (RN CM) Complete Complete  Some recent data might be hidden

## 2020-12-25 DIAGNOSIS — M19042 Primary osteoarthritis, left hand: Secondary | ICD-10-CM | POA: Diagnosis not present

## 2020-12-25 DIAGNOSIS — Z9981 Dependence on supplemental oxygen: Secondary | ICD-10-CM | POA: Diagnosis not present

## 2020-12-25 DIAGNOSIS — M19041 Primary osteoarthritis, right hand: Secondary | ICD-10-CM | POA: Diagnosis not present

## 2020-12-25 DIAGNOSIS — I083 Combined rheumatic disorders of mitral, aortic and tricuspid valves: Secondary | ICD-10-CM | POA: Diagnosis not present

## 2020-12-25 DIAGNOSIS — I110 Hypertensive heart disease with heart failure: Secondary | ICD-10-CM | POA: Diagnosis not present

## 2020-12-25 DIAGNOSIS — K8020 Calculus of gallbladder without cholecystitis without obstruction: Secondary | ICD-10-CM | POA: Diagnosis not present

## 2020-12-25 DIAGNOSIS — I5032 Chronic diastolic (congestive) heart failure: Secondary | ICD-10-CM | POA: Diagnosis not present

## 2020-12-25 DIAGNOSIS — J9601 Acute respiratory failure with hypoxia: Secondary | ICD-10-CM | POA: Diagnosis not present

## 2020-12-25 DIAGNOSIS — Z7901 Long term (current) use of anticoagulants: Secondary | ICD-10-CM | POA: Diagnosis not present

## 2020-12-25 DIAGNOSIS — I480 Paroxysmal atrial fibrillation: Secondary | ICD-10-CM | POA: Diagnosis not present

## 2020-12-25 DIAGNOSIS — C50919 Malignant neoplasm of unspecified site of unspecified female breast: Secondary | ICD-10-CM | POA: Diagnosis not present

## 2020-12-25 DIAGNOSIS — I2510 Atherosclerotic heart disease of native coronary artery without angina pectoris: Secondary | ICD-10-CM | POA: Diagnosis not present

## 2020-12-25 DIAGNOSIS — M479 Spondylosis, unspecified: Secondary | ICD-10-CM | POA: Diagnosis not present

## 2020-12-25 DIAGNOSIS — E1122 Type 2 diabetes mellitus with diabetic chronic kidney disease: Secondary | ICD-10-CM | POA: Diagnosis not present

## 2020-12-25 DIAGNOSIS — I700 Atherosclerosis of aorta: Secondary | ICD-10-CM | POA: Diagnosis not present

## 2020-12-25 DIAGNOSIS — N184 Chronic kidney disease, stage 4 (severe): Secondary | ICD-10-CM | POA: Diagnosis not present

## 2020-12-26 DIAGNOSIS — R0602 Shortness of breath: Secondary | ICD-10-CM | POA: Diagnosis not present

## 2020-12-26 DIAGNOSIS — N184 Chronic kidney disease, stage 4 (severe): Secondary | ICD-10-CM | POA: Diagnosis not present

## 2020-12-26 DIAGNOSIS — Z79899 Other long term (current) drug therapy: Secondary | ICD-10-CM | POA: Diagnosis not present

## 2020-12-26 DIAGNOSIS — I129 Hypertensive chronic kidney disease with stage 1 through stage 4 chronic kidney disease, or unspecified chronic kidney disease: Secondary | ICD-10-CM | POA: Diagnosis not present

## 2020-12-26 DIAGNOSIS — E1121 Type 2 diabetes mellitus with diabetic nephropathy: Secondary | ICD-10-CM | POA: Diagnosis not present

## 2021-01-03 LAB — CUP PACEART REMOTE DEVICE CHECK
Date Time Interrogation Session: 20221027001404
Implantable Pulse Generator Implant Date: 20190614

## 2021-01-08 ENCOUNTER — Ambulatory Visit (INDEPENDENT_AMBULATORY_CARE_PROVIDER_SITE_OTHER): Payer: Medicare Other

## 2021-01-08 DIAGNOSIS — I480 Paroxysmal atrial fibrillation: Secondary | ICD-10-CM

## 2021-01-15 NOTE — Progress Notes (Signed)
[facility] Summary Report / Loop Recorder 

## 2021-01-17 DIAGNOSIS — E1121 Type 2 diabetes mellitus with diabetic nephropathy: Secondary | ICD-10-CM | POA: Diagnosis not present

## 2021-01-17 DIAGNOSIS — E7800 Pure hypercholesterolemia, unspecified: Secondary | ICD-10-CM | POA: Diagnosis not present

## 2021-01-17 DIAGNOSIS — N184 Chronic kidney disease, stage 4 (severe): Secondary | ICD-10-CM | POA: Diagnosis not present

## 2021-01-17 DIAGNOSIS — I129 Hypertensive chronic kidney disease with stage 1 through stage 4 chronic kidney disease, or unspecified chronic kidney disease: Secondary | ICD-10-CM | POA: Diagnosis not present

## 2021-01-19 DIAGNOSIS — J449 Chronic obstructive pulmonary disease, unspecified: Secondary | ICD-10-CM | POA: Diagnosis not present

## 2021-01-24 DIAGNOSIS — I110 Hypertensive heart disease with heart failure: Secondary | ICD-10-CM | POA: Diagnosis not present

## 2021-01-24 DIAGNOSIS — M479 Spondylosis, unspecified: Secondary | ICD-10-CM | POA: Diagnosis not present

## 2021-01-24 DIAGNOSIS — I083 Combined rheumatic disorders of mitral, aortic and tricuspid valves: Secondary | ICD-10-CM | POA: Diagnosis not present

## 2021-01-24 DIAGNOSIS — J9601 Acute respiratory failure with hypoxia: Secondary | ICD-10-CM | POA: Diagnosis not present

## 2021-01-24 DIAGNOSIS — E1122 Type 2 diabetes mellitus with diabetic chronic kidney disease: Secondary | ICD-10-CM | POA: Diagnosis not present

## 2021-01-24 DIAGNOSIS — I2510 Atherosclerotic heart disease of native coronary artery without angina pectoris: Secondary | ICD-10-CM | POA: Diagnosis not present

## 2021-01-24 DIAGNOSIS — K8020 Calculus of gallbladder without cholecystitis without obstruction: Secondary | ICD-10-CM | POA: Diagnosis not present

## 2021-01-24 DIAGNOSIS — R9431 Abnormal electrocardiogram [ECG] [EKG]: Secondary | ICD-10-CM | POA: Diagnosis not present

## 2021-01-24 DIAGNOSIS — M19042 Primary osteoarthritis, left hand: Secondary | ICD-10-CM | POA: Diagnosis not present

## 2021-01-24 DIAGNOSIS — I700 Atherosclerosis of aorta: Secondary | ICD-10-CM | POA: Diagnosis not present

## 2021-01-24 DIAGNOSIS — R0600 Dyspnea, unspecified: Secondary | ICD-10-CM | POA: Diagnosis not present

## 2021-01-24 DIAGNOSIS — Z9981 Dependence on supplemental oxygen: Secondary | ICD-10-CM | POA: Diagnosis not present

## 2021-01-24 DIAGNOSIS — M19041 Primary osteoarthritis, right hand: Secondary | ICD-10-CM | POA: Diagnosis not present

## 2021-01-24 DIAGNOSIS — N184 Chronic kidney disease, stage 4 (severe): Secondary | ICD-10-CM | POA: Diagnosis not present

## 2021-01-24 DIAGNOSIS — Z7901 Long term (current) use of anticoagulants: Secondary | ICD-10-CM | POA: Diagnosis not present

## 2021-01-24 DIAGNOSIS — I5032 Chronic diastolic (congestive) heart failure: Secondary | ICD-10-CM | POA: Diagnosis not present

## 2021-01-24 DIAGNOSIS — R269 Unspecified abnormalities of gait and mobility: Secondary | ICD-10-CM | POA: Diagnosis not present

## 2021-01-24 DIAGNOSIS — I480 Paroxysmal atrial fibrillation: Secondary | ICD-10-CM | POA: Diagnosis not present

## 2021-01-24 DIAGNOSIS — C50919 Malignant neoplasm of unspecified site of unspecified female breast: Secondary | ICD-10-CM | POA: Diagnosis not present

## 2021-01-30 ENCOUNTER — Emergency Department (HOSPITAL_COMMUNITY)
Admission: EM | Admit: 2021-01-30 | Discharge: 2021-01-30 | Disposition: A | Discharge status: Home / Self Care | Payer: Medicare Other | Attending: Emergency Medicine | Admitting: Emergency Medicine

## 2021-01-30 ENCOUNTER — Emergency Department (HOSPITAL_COMMUNITY): Payer: Medicare Other

## 2021-01-30 ENCOUNTER — Other Ambulatory Visit: Payer: Self-pay

## 2021-01-30 ENCOUNTER — Encounter (HOSPITAL_COMMUNITY): Payer: Self-pay

## 2021-01-30 DIAGNOSIS — M1611 Unilateral primary osteoarthritis, right hip: Secondary | ICD-10-CM | POA: Diagnosis not present

## 2021-01-30 DIAGNOSIS — Z79899 Other long term (current) drug therapy: Secondary | ICD-10-CM | POA: Insufficient documentation

## 2021-01-30 DIAGNOSIS — S82831A Other fracture of upper and lower end of right fibula, initial encounter for closed fracture: Secondary | ICD-10-CM | POA: Insufficient documentation

## 2021-01-30 DIAGNOSIS — N183 Chronic kidney disease, stage 3 unspecified: Secondary | ICD-10-CM | POA: Diagnosis not present

## 2021-01-30 DIAGNOSIS — M25571 Pain in right ankle and joints of right foot: Secondary | ICD-10-CM | POA: Diagnosis not present

## 2021-01-30 DIAGNOSIS — M1711 Unilateral primary osteoarthritis, right knee: Secondary | ICD-10-CM | POA: Diagnosis not present

## 2021-01-30 DIAGNOSIS — R0902 Hypoxemia: Secondary | ICD-10-CM | POA: Diagnosis not present

## 2021-01-30 DIAGNOSIS — S89301A Unspecified physeal fracture of lower end of right fibula, initial encounter for closed fracture: Secondary | ICD-10-CM | POA: Diagnosis not present

## 2021-01-30 DIAGNOSIS — W1811XA Fall from or off toilet without subsequent striking against object, initial encounter: Secondary | ICD-10-CM | POA: Diagnosis not present

## 2021-01-30 DIAGNOSIS — I129 Hypertensive chronic kidney disease with stage 1 through stage 4 chronic kidney disease, or unspecified chronic kidney disease: Secondary | ICD-10-CM | POA: Insufficient documentation

## 2021-01-30 DIAGNOSIS — Z853 Personal history of malignant neoplasm of breast: Secondary | ICD-10-CM | POA: Insufficient documentation

## 2021-01-30 DIAGNOSIS — S99911A Unspecified injury of right ankle, initial encounter: Secondary | ICD-10-CM | POA: Diagnosis present

## 2021-01-30 DIAGNOSIS — Z85828 Personal history of other malignant neoplasm of skin: Secondary | ICD-10-CM | POA: Insufficient documentation

## 2021-01-30 DIAGNOSIS — E119 Type 2 diabetes mellitus without complications: Secondary | ICD-10-CM | POA: Diagnosis not present

## 2021-01-30 DIAGNOSIS — I10 Essential (primary) hypertension: Secondary | ICD-10-CM | POA: Diagnosis not present

## 2021-01-30 DIAGNOSIS — M7989 Other specified soft tissue disorders: Secondary | ICD-10-CM | POA: Diagnosis not present

## 2021-01-30 DIAGNOSIS — R739 Hyperglycemia, unspecified: Secondary | ICD-10-CM | POA: Diagnosis not present

## 2021-01-30 DIAGNOSIS — M25461 Effusion, right knee: Secondary | ICD-10-CM | POA: Diagnosis not present

## 2021-01-30 DIAGNOSIS — W19XXXA Unspecified fall, initial encounter: Secondary | ICD-10-CM | POA: Diagnosis not present

## 2021-01-30 DIAGNOSIS — M25551 Pain in right hip: Secondary | ICD-10-CM | POA: Diagnosis not present

## 2021-01-30 LAB — CBC WITH DIFFERENTIAL/PLATELET
Abs Immature Granulocytes: 0.02 10*3/uL (ref 0.00–0.07)
Basophils Absolute: 0 10*3/uL (ref 0.0–0.1)
Basophils Relative: 0 %
Eosinophils Absolute: 0.2 10*3/uL (ref 0.0–0.5)
Eosinophils Relative: 2 %
HCT: 31.5 % — ABNORMAL LOW (ref 36.0–46.0)
Hemoglobin: 9.8 g/dL — ABNORMAL LOW (ref 12.0–15.0)
Immature Granulocytes: 0 %
Lymphocytes Relative: 33 %
Lymphs Abs: 3.5 10*3/uL (ref 0.7–4.0)
MCH: 31.6 pg (ref 26.0–34.0)
MCHC: 31.1 g/dL (ref 30.0–36.0)
MCV: 101.6 fL — ABNORMAL HIGH (ref 80.0–100.0)
Monocytes Absolute: 0.8 10*3/uL (ref 0.1–1.0)
Monocytes Relative: 8 %
Neutro Abs: 5.9 10*3/uL (ref 1.7–7.7)
Neutrophils Relative %: 57 %
Platelets: 201 10*3/uL (ref 150–400)
RBC: 3.1 MIL/uL — ABNORMAL LOW (ref 3.87–5.11)
RDW: 15 % (ref 11.5–15.5)
WBC: 10.4 10*3/uL (ref 4.0–10.5)
nRBC: 0 % (ref 0.0–0.2)

## 2021-01-30 LAB — COMPREHENSIVE METABOLIC PANEL
ALT: 17 U/L (ref 0–44)
AST: 15 U/L (ref 15–41)
Albumin: 3.5 g/dL (ref 3.5–5.0)
Alkaline Phosphatase: 81 U/L (ref 38–126)
Anion gap: 10 (ref 5–15)
BUN: 78 mg/dL — ABNORMAL HIGH (ref 8–23)
CO2: 29 mmol/L (ref 22–32)
Calcium: 10.5 mg/dL — ABNORMAL HIGH (ref 8.9–10.3)
Chloride: 100 mmol/L (ref 98–111)
Creatinine, Ser: 2.62 mg/dL — ABNORMAL HIGH (ref 0.44–1.00)
GFR, Estimated: 17 mL/min — ABNORMAL LOW (ref >60)
Glucose, Bld: 260 mg/dL — ABNORMAL HIGH (ref 70–99)
Potassium: 4 mmol/L (ref 3.5–5.1)
Sodium: 139 mmol/L (ref 135–145)
Total Bilirubin: 0.7 mg/dL (ref 0.3–1.2)
Total Protein: 6.8 g/dL (ref 6.5–8.1)

## 2021-01-30 MED ORDER — ACETAMINOPHEN 325 MG PO TABS
650.0000 mg | ORAL_TABLET | Freq: Once | ORAL | Status: AC
  Administered 2021-01-30: 650 mg via ORAL

## 2021-01-30 NOTE — Discharge Instructions (Addendum)
You were evaluated in the Emergency Department and after careful evaluation, we did not find any emergent condition requiring admission or further testing in the hospital.  Please wear the cam walker boot until seen by orthopedics.  Please see Dr. Griffin Basil whose contact information is provided in your discharge paperwork.  You may use Tylenol, ice for pain.  Keep your leg elevated.  Please return to the Emergency Department if you experience any worsening of your condition.  Thank you for allowing [place] to be a part of your care.

## 2021-01-30 NOTE — ED Provider Notes (Signed)
[facility] DEPT Provider Note   CSN: [id number] Arrival date & time: 01/30/21  1217     History Chief Complaint  Patient presents with   Ankle Injury    Sabrina [name] is a 87 y.o. female.  HPI 87 year old female with a history of CKD stage III, basal cell carcinoma of the face, DVT/PE on Eliquis, DM type II, breast cancer presents to the ER with complaints of right ankle pain.  Patient states that she was in the restroom, was on the toilet and as she was getting up her right knee gave out.  She states that she fell between the toilet and the door.  She fell onto her right side, and did not hit her head.  She denies any headache, chest pain, dizziness, loss of consciousness at the time of the fall.  She was able to get up but has had persistent right ankle pain, worse with ambulation.  She also complains of some right hip pain and knee pain that is mild.  She denies any present headache, chest pain, shortness of breath, dizziness.  Patient lives alone but does have in-home care about 13 hours a day.    Past Medical History:  Diagnosis Date   Arthritis    "mostly in my hands, lower back" (06/25/2017)   Basal cell carcinoma (BCC) of face 1983   Breast cancer, right breast ([place]) 03/2013   Chronic kidney disease (CKD), stage III (moderate) ([place])    nephrologist, Dr. [name]   Dental crowns present    DVT (deep venous thrombosis) ([place]) ~ 06/2013   "? side"   Gout    "on daily RX" (06/25/2017)   Heart murmur    no known problems; states did not know she had murmur until age 69   High cholesterol    Hypertension    fluctuates, especially when stressed; has been on med. > 20 yr.   Immature cataract    Non-insulin dependent type 2 diabetes mellitus ([place])    Personal history of chemotherapy    Personal history of radiation therapy    Pulmonary embolism ([place]) ~ 06/2013   Radiation 09/06/13-10/20/13   Right Breast Cancer   Stroke ([place]) 05/2017    just visual problems since (06/25/2017)   Wears partial dentures    lower    Patient Active Problem List   Diagnosis Date Noted   Nonspecific abnormal electrocardiogram (ECG) (EKG)    Dyspnea 12/18/2020   Abnormal gait 05/18/2020   Body mass index (BMI) 35.0-35.9, adult 05/18/2020   Cardiac pacemaker in situ 05/18/2020   Diabetic nephropathy ([place]) 05/18/2020   IgM monoclonal gammopathy of uncertain significance 05/18/2020   Malignant hypertensive chronic kidney disease 05/18/2020   Morbid obesity ([place]) 05/18/2020   Thrombophilia ([place]) 05/18/2020   Secondary hypercoagulable state ([place]) 08/16/2019   Osteoporosis 05/04/2019   Hypercalcemia 11/04/2018   History of stroke 11/04/2018   Paroxysmal atrial fibrillation ([place]) 11/04/2018   Acute kidney injury ([place])    Acute metabolic encephalopathy 32/44/0102   Personal history of DVT and pulm embolus 10/26/2018   Personal history of other venous thrombosis and embolism 10/26/2018   UTI (urinary tract infection) 10/24/2018   Clavicle fracture 09/26/2018   Degeneration of lumbar intervertebral disc 10/01/2017   Blurry vision, bilateral    Acute blood loss anemia    History of breast cancer 05/30/2017   Physical debility 05/30/2017   Occipital infarction ([place]) 05/30/2017   Pressure injury of skin 05/28/2017   Fall  at home 05/27/2017   CKD (chronic kidney disease), stage IV (HCC) 05/27/2017   High cholesterol 05/27/2017   DM (diabetes mellitus) ([place]) 05/27/2017   Essential hypertension 05/27/2017   Gout 05/27/2017   Elevated troponin 05/27/2017   Lumbar radiculopathy 05/15/2017   Pain of right calf 04/30/2017   Asymmetrical left sensorineural hearing loss 02/14/2016   Impacted cerumen of left ear 02/14/2016   Lipoma of lower extremity 09/12/2014   Abnormal x-ray 03/15/2014   Chronic diastolic congestive heart failure ([place]) 02/23/2014   Candidiasis of skin 01/13/2014   Osteopenia 11/30/2013   Hypoglycemia 09/13/2013   Acute  respiratory failure with hypoxia ([place]) 06/14/2013   History of pulmonary embolism 06/10/2013   Nausea and vomiting 06/10/2013   Accelerated hypertension 06/10/2013   Candidiasis of vagina 05/19/2013   Aortic stenosis 04/22/2013   Edema leg 04/22/2013   Breast cancer of upper-outer quadrant of right female breast ([place]); in remission 03/19/2013    Past Surgical History:  Procedure Laterality Date   AXILLARY LYMPH NODE DISSECTION Right 03/30/2013   Procedure: AXILLARY LYMPH NODE DISSECTION;  Surgeon: Rolm Bookbinder, MD;  Location: [facility];  Service: General;  Laterality: Right;   BASAL CELL CARCINOMA EXCISION  1983   "face"   BREAST BIOPSY Right 03/17/2013   BREAST CYST EXCISION Right 11/1958   benign   BREAST LUMPECTOMY Right 03/30/2013   BREAST LUMPECTOMY WITH NEEDLE LOCALIZATION Right 03/30/2013   Procedure: BREAST LUMPECTOMY WITH NEEDLE LOCALIZATION;  Surgeon: Rolm Bookbinder, MD;  Location: [facility];  Service: General;  Laterality: Right;   DILATION AND CURETTAGE OF UTERUS     LOOP RECORDER INSERTION N/A 08/15/2017   Procedure: LOOP RECORDER INSERTION;  Surgeon: Evans Lance, MD;  Location: [facility];  Service: Cardiovascular;  Laterality: N/A;   PORT-A-CATH REMOVAL  2016   PORTACATH PLACEMENT N/A 04/15/2013   Procedure: INSERTION PORT-A-CATH;  Surgeon: Rolm Bookbinder, MD;  Location: [facility];  Service: General;  Laterality: N/A;   RE-EXCISION OF BREAST CANCER,SUPERIOR MARGINS Right 04/15/2013   Procedure: RE-EXCISION OF RIGHT BREAST  MARGINS;  Surgeon: Rolm Bookbinder, MD;  Location: [facility];  Service: General;  Laterality: Right;   TONSILLECTOMY  ~ 1935/1936     OB History   No obstetric history on file.    Obstetric Comments  Menarche ae 14 No children Married 40 years         Family History  Problem Relation Age of Onset   Pneumonia Mother    Heart attack Father    Breast cancer Other 52       niece   Breast cancer  Other 38       niece   Ovarian cancer Other        niece    Social History   Tobacco Use   Smoking status: Never   Smokeless tobacco: Never   Tobacco comments:    only smoked 2 packs cigarettes total; husband quit in 1971  Vaping Use   Vaping Use: Never used  Substance Use Topics   Alcohol use: Not Currently    Alcohol/week: 3.0 standard drinks    Types: 3 Glasses of wine per week   Drug use: No    Home Medications Prior to Admission medications   Medication Sig Start Date End Date Taking? Authorizing Provider  albuterol (VENTOLIN HFA) 108 (90 Base) MCG/ACT inhaler Inhale 1-2 puffs into the lungs every 6 (six) hours as needed for wheezing or shortness of breath. 03/22/19  [provider]  allopurinol (ZYLOPRIM) 100 MG tablet Take 100 mg by mouth daily. 01/17/16   [provider]  amLODipine (NORVASC) 2.5 MG tablet Take 2.5 mg by mouth daily. 04/11/20   [provider]  cetirizine (ZYRTEC) 10 MG tablet Take 10 mg by mouth daily.    [provider]  cholecalciferol (VITAMIN D) 1000 UNITS tablet Take 2,000 Units by mouth daily.    [provider]  cloNIDine (CATAPRES) 0.2 MG tablet Take 0.2 mg by mouth 2 (two) times daily.    [provider]  denosumab (PROLIA) 60 MG/ML SOSY injection Inject 60 mg into the skin every 6 (six) months. Patient not taking: No sig reported    [provider]  Dextromethorphan-guaiFENesin (MUCINEX DM PO) Take 30 mLs by mouth daily as needed (cough).    [provider]  ELIQUIS 2.5 MG TABS tablet TAKE 1 TABLET BY MOUTH TWICE DAILY. Patient taking differently: Take 2.5 mg by mouth 2 (two) times daily. 07/04/20   Fenton, Clint R, PA  famotidine (PEPCID) 20 MG tablet Take 20 mg by mouth daily. 11/09/19   [provider]  furosemide (LASIX) 40 MG tablet Take 40 mg by mouth daily. Per Dr. If over 166lb take 80mg  a day    [provider]  gabapentin (NEURONTIN) 100 MG capsule  Take 3 capsules (300 mg total) by mouth at bedtime. Take 200 mg by mouth twice a day and take 300 mg by mouth at bedtime Patient taking differently: Take 200-300 mg by mouth See admin instructions. Take 200 mg by mouth twice a day and take 300 mg by mouth at bedtime 11/06/18   Mariel Aloe, MD  glipiZIDE (GLUCOTROL) 5 MG tablet Take 5 mg by mouth 2 (two) times daily before a meal.     [provider]  hydrALAZINE (APRESOLINE) 50 MG tablet Take 50 mg by mouth 3 (three) times daily.    [provider]  hydrocortisone cream 1 % Apply 1 application topically daily as needed for itching.    [provider]  isosorbide mononitrate (IMDUR) 30 MG 24 hr tablet Take 1 tablet (30 mg total) by mouth daily. 12/22/20 01/21/21  Pokhrel, Corrie Mckusick, MD  polyethylene glycol (MIRALAX / GLYCOLAX) packet Take 17 g by mouth daily.    [provider]  predniSONE (STERAPRED UNI-PAK 21 TAB) 10 MG (21) TBPK tablet 3 tab orally daily x 2 days then 2 tab po daily x 2 days then 1 tab po daily x 2 days 12/22/20   Pokhrel, Corrie Mckusick, MD  traMADol (ULTRAM) 50 MG tablet Take 50 mg by mouth 2 (two) times daily as needed for moderate pain.     [provider]  TRUE METRIX BLOOD GLUCOSE TEST test strip  07/19/19   [provider]  TRUEplus Lancets 28G MISC Apply topically. 08/20/19   [provider]    Allergies    Beta adrenergic blockers  Review of Systems   Review of Systems Ten systems reviewed and are negative for acute change, except as noted in the HPI.   Physical Exam Updated Vital Signs BP (!) 147/50 (BP Location: Right Arm)   Pulse 88   Temp 97.9 F (36.6 C) (Oral)   Resp 14   Ht 4\' 10"  (1.473 m)   Wt 74.8 kg   SpO2 96%   BMI 34.49 kg/m   Physical Exam Vitals and nursing note reviewed.  Constitutional:      General: She is not in  acute distress.    Appearance: She is well-developed.  HENT:     Head: Normocephalic and atraumatic.  Eyes:      Conjunctiva/sclera: Conjunctivae normal.  Cardiovascular:     Rate and Rhythm: Normal rate and regular rhythm.     Heart sounds: No murmur heard. Pulmonary:     Effort: Pulmonary effort is normal. No respiratory distress.     Breath sounds: Normal breath sounds.  Abdominal:     Palpations: Abdomen is soft.     Tenderness: There is no abdominal tenderness.  Musculoskeletal:        General: Swelling, tenderness and signs of injury present. No deformity.     Cervical back: Neck supple.     Right lower leg: Edema present.     Comments: Right ankle with visible erythema and 1+ edema.  No visible deformities/skin breakage.  2+ DP pulses.  Slightly reduced flexion and extension of the right ankle secondary to pain.  Sensations intact.  No visible leg shortening.  Full flexion extension of the right knee, no tibial plateau tenderness.  Able to flex hip in chair without difficulty.  Skin:    General: Skin is warm and dry.     Capillary Refill: Capillary refill takes less than 2 seconds.  Neurological:     General: No focal deficit present.     Mental Status: She is alert and oriented to person, place, and time.     Sensory: No sensory deficit.     Motor: No weakness.  Psychiatric:        Mood and Affect: Mood normal.    ED Results / Procedures / Treatments   Labs (all labs ordered are listed, but only abnormal results are displayed) Labs Reviewed  CBC WITH DIFFERENTIAL/PLATELET - Abnormal; Notable for the following components:      Result Value   RBC 3.10 (*)    Hemoglobin 9.8 (*)    HCT 31.5 (*)    MCV 101.6 (*)    All other components within normal limits  COMPREHENSIVE METABOLIC PANEL - Abnormal; Notable for the following components:   Glucose, Bld 260 (*)    BUN 78 (*)    Creatinine, Ser 2.62 (*)    Calcium 10.5 (*)    GFR, Estimated 17 (*)    All other components within normal limits    EKG None  Radiology DG Ankle Complete Right  Result Date: 01/30/2021 CLINICAL  DATA:  fall r knee, ankle, hip pain EXAM: RIGHT ANKLE - COMPLETE 3+ VIEW COMPARISON:  None. FINDINGS: There is evidence of nondisplaced fracture of the distal fibular diaphysis. There is ankle soft tissue swelling. Vascular calcifications. There is diffuse midfoot arthritis. Plantar calcaneal spurring. IMPRESSION: Findings suggest a nondisplaced fracture of the distal fibular diaphysis. Ankle soft tissue swelling. Electronically Signed   By: Maurine Simmering M.D.   On: 01/30/2021 14:24   DG Knee Complete 4 Views Right  Result Date: 01/30/2021 CLINICAL DATA:  fall r knee, ankle, hip pain EXAM: RIGHT KNEE - COMPLETE 4+ VIEW COMPARISON:  None. FINDINGS: There is no evidence of acute fracture. There is tricompartment degenerative change with mild medial joint space narrowing. Trace joint effusion. Suprapatellar enthesophyte. Vascular calcifications. IMPRESSION: No acute osseous abnormality.  Trace joint effusion. Tricompartment osteoarthritis, worst in the medial compartment. Electronically Signed   By: Maurine Simmering M.D.   On: 01/30/2021 14:26   DG Hip Unilat W or Wo Pelvis 2-3 Views Right  Result Date: 01/30/2021 CLINICAL DATA:  fall  r knee, ankle, hip pain EXAM: DG HIP (WITH OR WITHOUT PELVIS) 2-3V RIGHT COMPARISON:  None. FINDINGS: There is no evidence of acute fracture. There is mild right hip osteoarthritis. Vascular calcifications. IMPRESSION: No evidence of hip fracture.  Mild right hip osteoarthritis. Electronically Signed   By: Maurine Simmering M.D.   On: 01/30/2021 14:28    Procedures Procedures   Medications Ordered in ED Medications  acetaminophen (TYLENOL) tablet 650 mg (650 mg Oral Given 01/30/21 1651)    ED Course  I have reviewed the triage vital signs and the nursing notes.  Pertinent labs & imaging results that were available during my care of the patient were reviewed by me and considered in my medical decision making (see chart for details).    MDM Rules/Calculators/A&P                            87 year old female presented to the ER with right ankle pain after a fall.  On arrival, she is alert and oriented x3, able to give a clear and concise history.  She states that she did not hit her head, does not have a headache, vision changes, nausea, vomiting.  Low suspicion for intracranial bleed at this time.  She is adamant that she does not need a CT scan of the head.  Vitals overall reassuring.  CBC and BMP are at baseline.  Physical exam with visible right ankle edema, limited range of motion secondary to pain.  No visible deformities, no evidence of open fracture.  Plain films of the knee and hip overall reassuring, right ankle x-ray suggestive of distal fibula.  Distal pulses are intact.  Will place in cam walker, patient does live alone and I would like to not have to force her to use crutches which could increase her risk of falling.  I attempted to call her niece and nephew who her power of attorney's, left a voicemail.  Patient is comfortable going home as she states that she has care at home for 13 hours a day.  Encouraged Tylenol for pain.  Will refer to orthopedics.  We discussed return precautions.  She voiced understanding and is agreeable.  Stable for discharge.  This was a shared visit with my supervising physician Dr. Zenia Resides who independently saw and evaluated the patient & provided guidance in evaluation/management/disposition ,in agreement with care  Final Clinical Impression(s) / ED Diagnoses Final diagnoses:  Closed fracture of distal end of right fibula, unspecified fracture morphology, initial encounter    Rx / DC Orders ED Discharge Orders     None        Lyndel Safe 01/30/21 1652    Lacretia Leigh, MD 02/01/21 1542

## 2021-01-30 NOTE — ED Provider Notes (Signed)
Emergency Medicine Provider Triage Evaluation Note  [facility] Sabrina [name] , a 87 y.o. female  was evaluated in triage.  Pt complains of R ankle pain since yesterday when her knees buckled. States she fell on her side onto the ground (outside the bathroom). She has associated knee and hip pain that are mild. She states she was standing out and her knees buckled.   No head injury or LOC, no NV. No confusion per pt and she is oriented in triage.  No falls today or toher injuries. No back or neck pain.   Review of Systems  Positive: R leg pain/hip pain Negative: Fever   Physical Exam  BP (!) 147/50 (BP Location: Right Arm)   Pulse 88   Temp 97.9 F (36.6 C) (Oral)   Resp 14   SpO2 96%  Gen:   Awake, no distress   Resp:  Normal effort  MSK:   Moves extremities without difficulty. Some TTP of r ankle and knee. No TTP of R hip.  Other:  Slightly swollen right ankle.   Medical Decision Making  Medically screening exam initiated at 1:07 PM.  Appropriate orders placed.  Sabrina [name] was informed that the remainder of the evaluation will be completed by another provider, this initial triage assessment does not replace that evaluation, and the importance of remaining in the ED until their evaluation is complete.  Labs and xrays ordered.   Tedd Sias, [place] 01/30/21 1309    Dorie Rank, MD 01/31/21 [phone]

## 2021-01-30 NOTE — ED Notes (Signed)
Unable to collect labs at this time patient in xray

## 2021-01-30 NOTE — ED Triage Notes (Signed)
Per EMS- patient is from home. Patient reports that she was getting out of the shower yesterday and her knees buckle. Patient states she lowered herself to the floor and is now having right ankle pain. Patient is alert and oriented.

## 2021-02-01 IMAGING — CT CT HEAD W/O CM
3 series · 15 of 46 positions shown, 18 images · non-contrast
Comparison: Head CT dated 09/26/2018

CLINICAL DATA: 89-year-old female with altered mental status.

EXAM:
CT HEAD WITHOUT CONTRAST
TECHNIQUE: Contiguous axial images were obtained from the base of the skull
through the vertex without intravenous contrast.

[Series 2: head wo · axial · 0.43mm/px · z∈[+1424,+1544]mm · 9 of 29 slices shown, 12 images]
[im 3/29  brain]
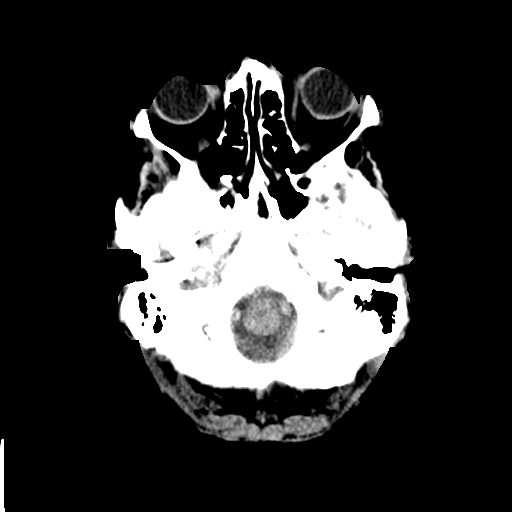
[im 3/29  bone]
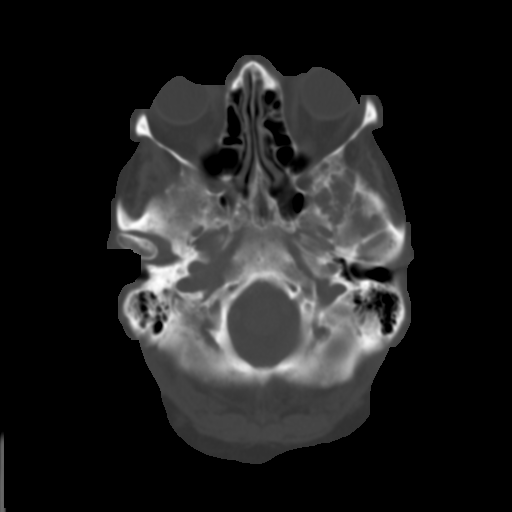
[im 6/29  brain]
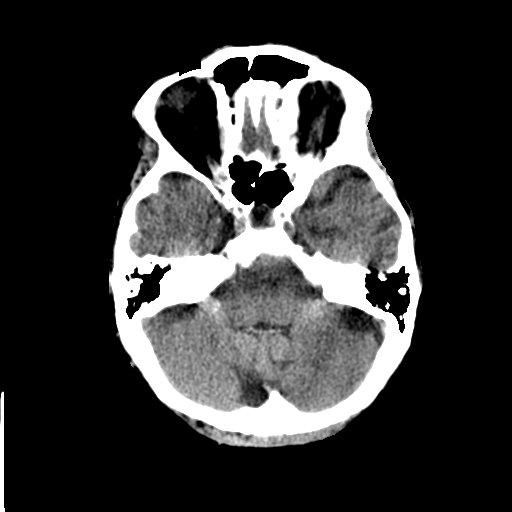
[im 9/29  brain]
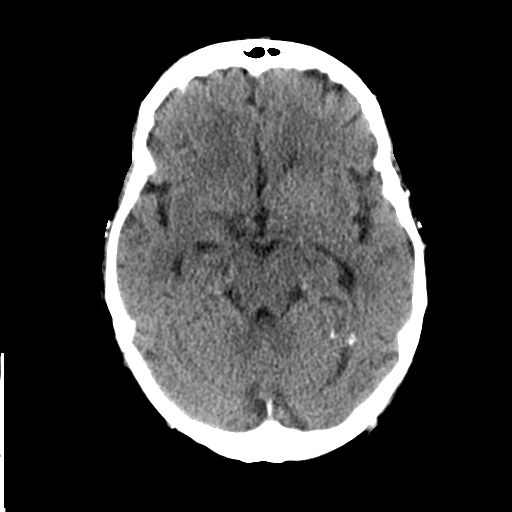
[im 12/29  brain]
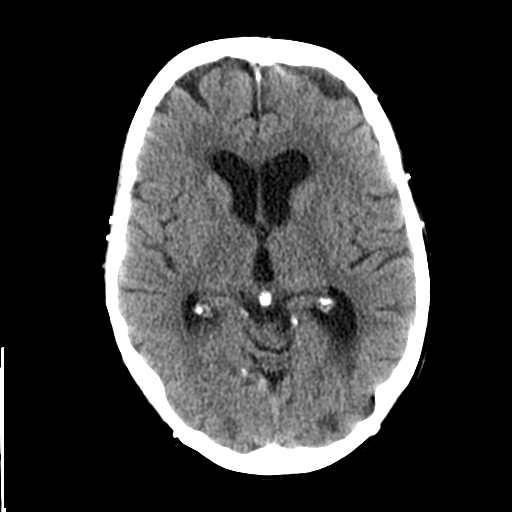
[im 15/29  brain]
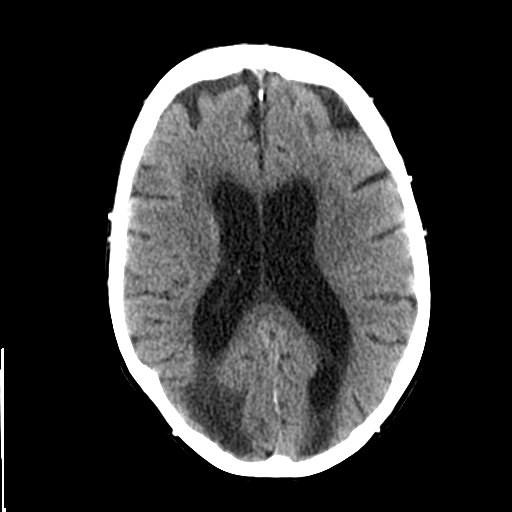
[im 15/29  bone]
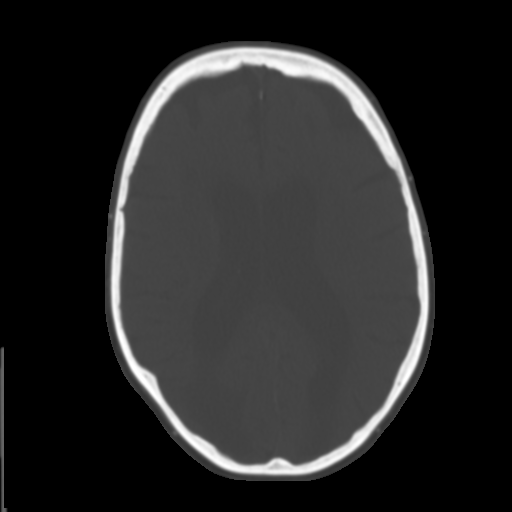
[im 18/29  brain]
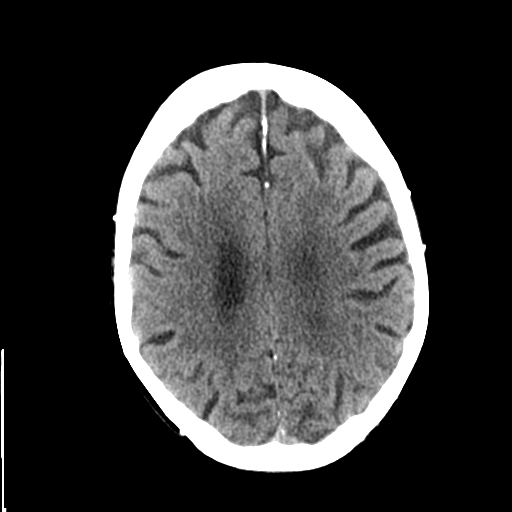
[im 21/29  brain]
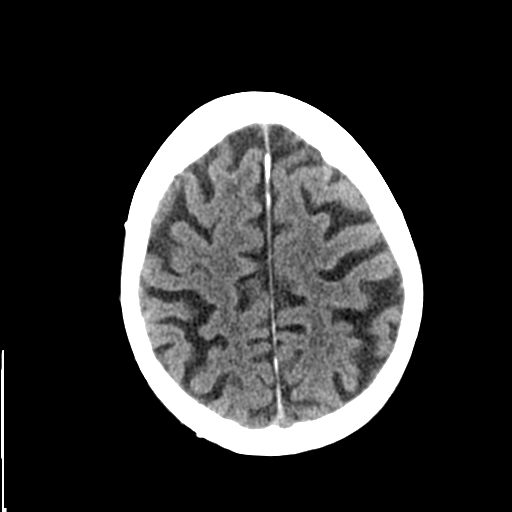
[im 24/29  brain]
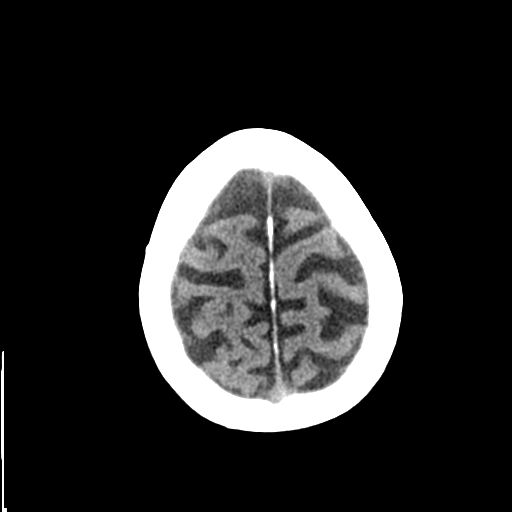
[im 27/29  brain]
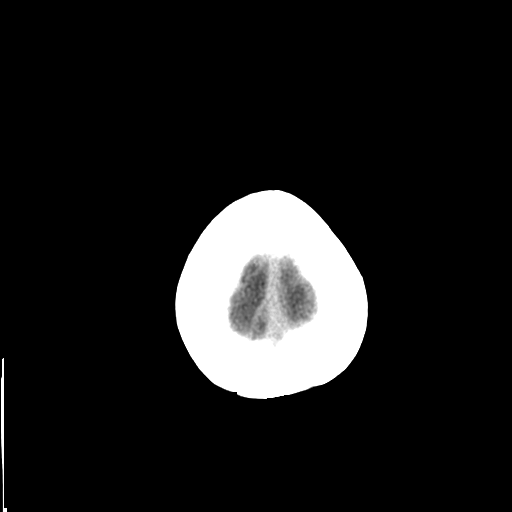
[im 27/29  bone]
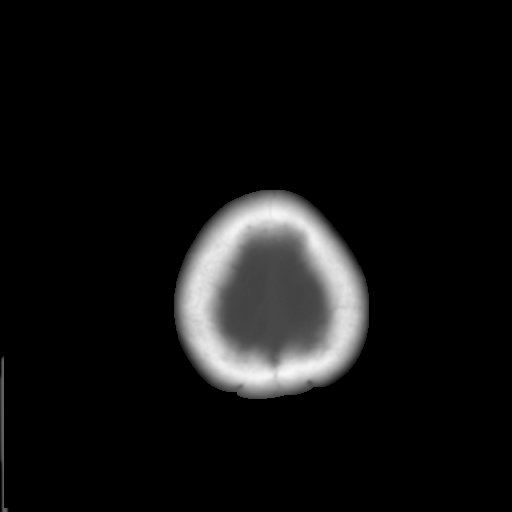

[Series 4: coronal soft tissue · coronal · 0.28mm/px · 3 of 66 slices shown]
[im 22/66  brain]
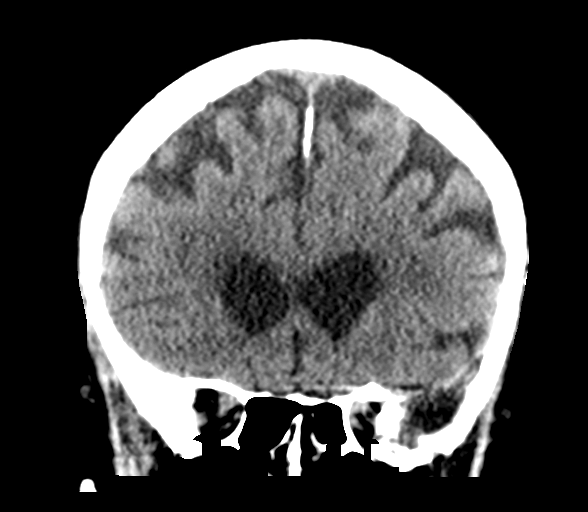
[im 29/66  brain]
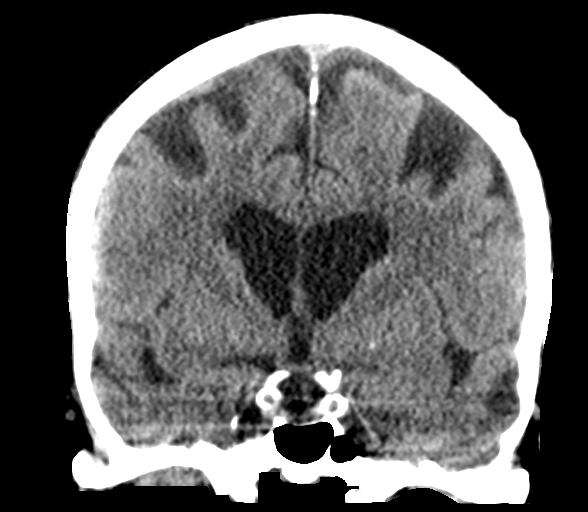
[im 37/66  brain]
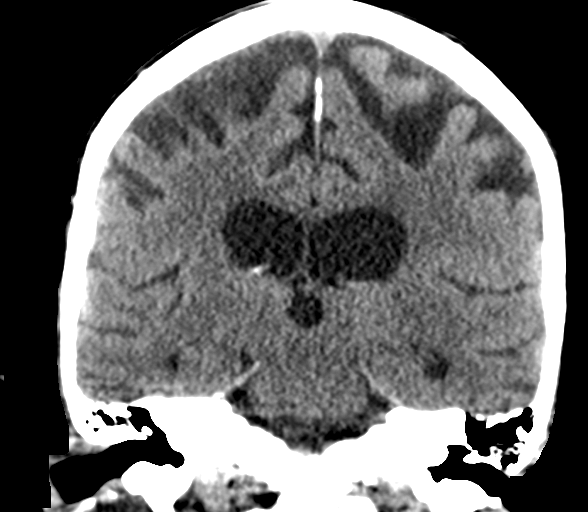

[Series 5: sagittal soft tissue · sagittal · 0.28mm/px · 3 of 52 slices shown]
[im 18/52  brain]
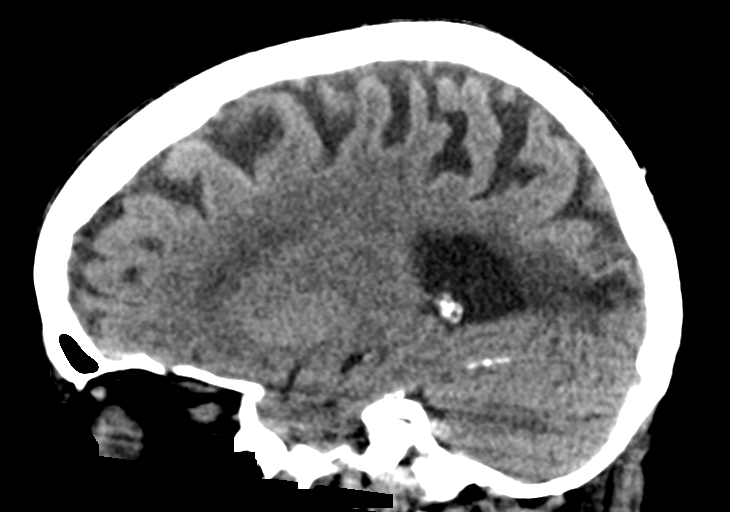
[im 26/52  brain]
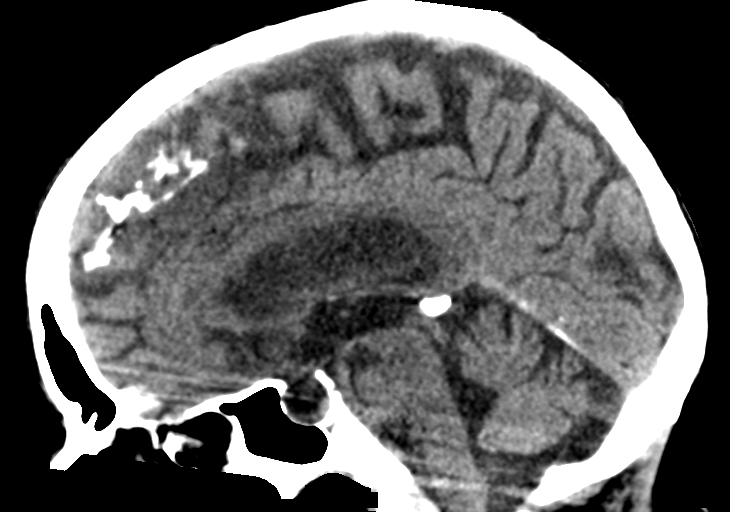
[im 35/52  brain]
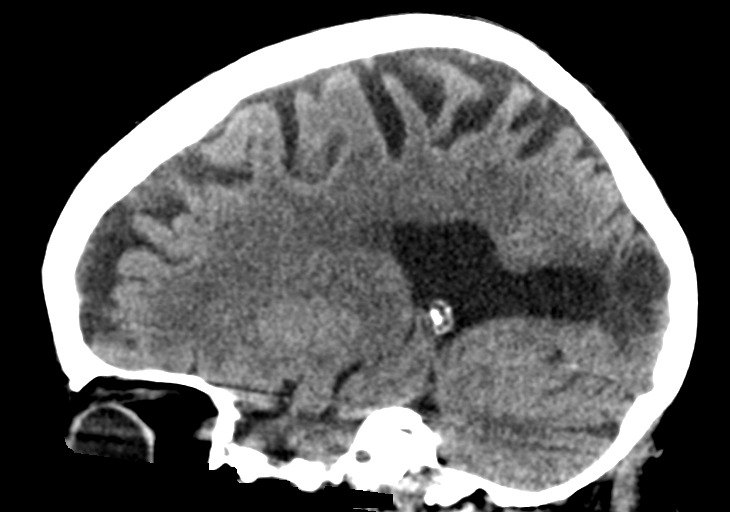

[15 of 46 positions shown; findings below may reference images not displayed]

FINDINGS: Brain: There is moderate age-related atrophy and chronic
microvascular ischemic changes. Bilateral occipital old infarcts
noted. There is no acute intracranial hemorrhage. No mass effect or
midline shift no extra-axial fluid collection.

Vascular: No hyperdense vessel or unexpected calcification.

Skull: Normal. Negative for fracture or focal lesion.

Sinuses/Orbits: No acute finding.

Other: None
IMPRESSION: 1. No acute intracranial hemorrhage.
2. Age-related atrophy and chronic microvascular ischemic changes.
Old bilateral occipital infarcts.

## 2021-02-02 DIAGNOSIS — S82454A Nondisplaced comminuted fracture of shaft of right fibula, initial encounter for closed fracture: Secondary | ICD-10-CM | POA: Diagnosis not present

## 2021-02-07 ENCOUNTER — Inpatient Hospital Stay (HOSPITAL_COMMUNITY)
Admission: EM | Admit: 2021-02-07 | Discharge: 2021-02-13 | DRG: 812 | Disposition: A | Discharge status: Home / Self Care | Payer: Medicare Other | Attending: Internal Medicine | Admitting: Internal Medicine

## 2021-02-07 ENCOUNTER — Emergency Department (HOSPITAL_COMMUNITY): Payer: Medicare Other

## 2021-02-07 ENCOUNTER — Encounter (HOSPITAL_COMMUNITY): Payer: Self-pay

## 2021-02-07 DIAGNOSIS — Z8249 Family history of ischemic heart disease and other diseases of the circulatory system: Secondary | ICD-10-CM

## 2021-02-07 DIAGNOSIS — I480 Paroxysmal atrial fibrillation: Secondary | ICD-10-CM | POA: Diagnosis not present

## 2021-02-07 DIAGNOSIS — R001 Bradycardia, unspecified: Secondary | ICD-10-CM | POA: Diagnosis not present

## 2021-02-07 DIAGNOSIS — R0602 Shortness of breath: Secondary | ICD-10-CM | POA: Diagnosis not present

## 2021-02-07 DIAGNOSIS — I639 Cerebral infarction, unspecified: Secondary | ICD-10-CM | POA: Diagnosis not present

## 2021-02-07 DIAGNOSIS — Z8041 Family history of malignant neoplasm of ovary: Secondary | ICD-10-CM | POA: Diagnosis not present

## 2021-02-07 DIAGNOSIS — Z86718 Personal history of other venous thrombosis and embolism: Secondary | ICD-10-CM

## 2021-02-07 DIAGNOSIS — D649 Anemia, unspecified: Principal | ICD-10-CM | POA: Diagnosis present

## 2021-02-07 DIAGNOSIS — R059 Cough, unspecified: Secondary | ICD-10-CM | POA: Diagnosis not present

## 2021-02-07 DIAGNOSIS — Z8673 Personal history of transient ischemic attack (TIA), and cerebral infarction without residual deficits: Secondary | ICD-10-CM

## 2021-02-07 DIAGNOSIS — R1110 Vomiting, unspecified: Secondary | ICD-10-CM

## 2021-02-07 DIAGNOSIS — I10 Essential (primary) hypertension: Secondary | ICD-10-CM | POA: Diagnosis not present

## 2021-02-07 DIAGNOSIS — E1122 Type 2 diabetes mellitus with diabetic chronic kidney disease: Secondary | ICD-10-CM | POA: Diagnosis present

## 2021-02-07 DIAGNOSIS — Z853 Personal history of malignant neoplasm of breast: Secondary | ICD-10-CM

## 2021-02-07 DIAGNOSIS — I5032 Chronic diastolic (congestive) heart failure: Secondary | ICD-10-CM | POA: Diagnosis present

## 2021-02-07 DIAGNOSIS — E1159 Type 2 diabetes mellitus with other circulatory complications: Secondary | ICD-10-CM | POA: Diagnosis present

## 2021-02-07 DIAGNOSIS — Z9221 Personal history of antineoplastic chemotherapy: Secondary | ICD-10-CM

## 2021-02-07 DIAGNOSIS — R140 Abdominal distension (gaseous): Secondary | ICD-10-CM | POA: Diagnosis not present

## 2021-02-07 DIAGNOSIS — E7800 Pure hypercholesterolemia, unspecified: Secondary | ICD-10-CM | POA: Diagnosis present

## 2021-02-07 DIAGNOSIS — R112 Nausea with vomiting, unspecified: Secondary | ICD-10-CM

## 2021-02-07 DIAGNOSIS — Z923 Personal history of irradiation: Secondary | ICD-10-CM

## 2021-02-07 DIAGNOSIS — I130 Hypertensive heart and chronic kidney disease with heart failure and stage 1 through stage 4 chronic kidney disease, or unspecified chronic kidney disease: Secondary | ICD-10-CM | POA: Diagnosis present

## 2021-02-07 DIAGNOSIS — I160 Hypertensive urgency: Secondary | ICD-10-CM | POA: Diagnosis not present

## 2021-02-07 DIAGNOSIS — J470 Bronchiectasis with acute lower respiratory infection: Secondary | ICD-10-CM | POA: Diagnosis not present

## 2021-02-07 DIAGNOSIS — Z4682 Encounter for fitting and adjustment of non-vascular catheter: Secondary | ICD-10-CM | POA: Diagnosis not present

## 2021-02-07 DIAGNOSIS — T380X5A Adverse effect of glucocorticoids and synthetic analogues, initial encounter: Secondary | ICD-10-CM | POA: Diagnosis not present

## 2021-02-07 DIAGNOSIS — Z86711 Personal history of pulmonary embolism: Secondary | ICD-10-CM

## 2021-02-07 DIAGNOSIS — R069 Unspecified abnormalities of breathing: Secondary | ICD-10-CM | POA: Diagnosis not present

## 2021-02-07 DIAGNOSIS — N184 Chronic kidney disease, stage 4 (severe): Secondary | ICD-10-CM | POA: Diagnosis present

## 2021-02-07 DIAGNOSIS — R0902 Hypoxemia: Secondary | ICD-10-CM | POA: Diagnosis not present

## 2021-02-07 DIAGNOSIS — Z803 Family history of malignant neoplasm of breast: Secondary | ICD-10-CM

## 2021-02-07 DIAGNOSIS — J90 Pleural effusion, not elsewhere classified: Secondary | ICD-10-CM | POA: Diagnosis not present

## 2021-02-07 DIAGNOSIS — R399 Unspecified symptoms and signs involving the genitourinary system: Secondary | ICD-10-CM | POA: Diagnosis not present

## 2021-02-07 DIAGNOSIS — N390 Urinary tract infection, site not specified: Secondary | ICD-10-CM | POA: Diagnosis not present

## 2021-02-07 DIAGNOSIS — R000 Tachycardia, unspecified: Secondary | ICD-10-CM | POA: Diagnosis present

## 2021-02-07 DIAGNOSIS — Z79899 Other long term (current) drug therapy: Secondary | ICD-10-CM

## 2021-02-07 DIAGNOSIS — E1169 Type 2 diabetes mellitus with other specified complication: Secondary | ICD-10-CM | POA: Diagnosis not present

## 2021-02-07 DIAGNOSIS — E1165 Type 2 diabetes mellitus with hyperglycemia: Secondary | ICD-10-CM | POA: Diagnosis not present

## 2021-02-07 DIAGNOSIS — Z7901 Long term (current) use of anticoagulants: Secondary | ICD-10-CM

## 2021-02-07 DIAGNOSIS — Z4659 Encounter for fitting and adjustment of other gastrointestinal appliance and device: Secondary | ICD-10-CM

## 2021-02-07 DIAGNOSIS — Z66 Do not resuscitate: Secondary | ICD-10-CM | POA: Diagnosis not present

## 2021-02-07 DIAGNOSIS — E871 Hypo-osmolality and hyponatremia: Secondary | ICD-10-CM | POA: Diagnosis not present

## 2021-02-07 DIAGNOSIS — J42 Unspecified chronic bronchitis: Secondary | ICD-10-CM | POA: Diagnosis present

## 2021-02-07 DIAGNOSIS — K439 Ventral hernia without obstruction or gangrene: Secondary | ICD-10-CM | POA: Diagnosis present

## 2021-02-07 DIAGNOSIS — Z7984 Long term (current) use of oral hypoglycemic drugs: Secondary | ICD-10-CM

## 2021-02-07 DIAGNOSIS — N179 Acute kidney failure, unspecified: Secondary | ICD-10-CM | POA: Diagnosis present

## 2021-02-07 DIAGNOSIS — K8020 Calculus of gallbladder without cholecystitis without obstruction: Secondary | ICD-10-CM | POA: Diagnosis not present

## 2021-02-07 DIAGNOSIS — Z85828 Personal history of other malignant neoplasm of skin: Secondary | ICD-10-CM

## 2021-02-07 DIAGNOSIS — I700 Atherosclerosis of aorta: Secondary | ICD-10-CM | POA: Diagnosis not present

## 2021-02-07 DIAGNOSIS — Z20822 Contact with and (suspected) exposure to covid-19: Secondary | ICD-10-CM | POA: Diagnosis not present

## 2021-02-07 DIAGNOSIS — I152 Hypertension secondary to endocrine disorders: Secondary | ICD-10-CM | POA: Diagnosis present

## 2021-02-07 DIAGNOSIS — J209 Acute bronchitis, unspecified: Secondary | ICD-10-CM | POA: Diagnosis present

## 2021-02-07 DIAGNOSIS — S82831D Other fracture of upper and lower end of right fibula, subsequent encounter for closed fracture with routine healing: Secondary | ICD-10-CM

## 2021-02-07 DIAGNOSIS — D72829 Elevated white blood cell count, unspecified: Secondary | ICD-10-CM | POA: Diagnosis not present

## 2021-02-07 LAB — CBC WITH DIFFERENTIAL/PLATELET
Abs Immature Granulocytes: 0.03 10*3/uL (ref 0.00–0.07)
Basophils Absolute: 0.1 10*3/uL (ref 0.0–0.1)
Basophils Relative: 1 %
Eosinophils Absolute: 0.7 10*3/uL — ABNORMAL HIGH (ref 0.0–0.5)
Eosinophils Relative: 6 %
HCT: 20.1 % — ABNORMAL LOW (ref 36.0–46.0)
Hemoglobin: 6.3 g/dL — CL (ref 12.0–15.0)
Immature Granulocytes: 0 %
Lymphocytes Relative: 32 %
Lymphs Abs: 3.5 10*3/uL (ref 0.7–4.0)
MCH: 31.8 pg (ref 26.0–34.0)
MCHC: 31.3 g/dL (ref 30.0–36.0)
MCV: 101.5 fL — ABNORMAL HIGH (ref 80.0–100.0)
Monocytes Absolute: 0.9 10*3/uL (ref 0.1–1.0)
Monocytes Relative: 8 %
Neutro Abs: 5.8 10*3/uL (ref 1.7–7.7)
Neutrophils Relative %: 53 %
Platelets: 294 10*3/uL (ref 150–400)
RBC: 1.98 MIL/uL — ABNORMAL LOW (ref 3.87–5.11)
RDW: 14.9 % (ref 11.5–15.5)
WBC: 10.9 10*3/uL — ABNORMAL HIGH (ref 4.0–10.5)
nRBC: 0 % (ref 0.0–0.2)

## 2021-02-07 LAB — URINALYSIS, ROUTINE W REFLEX MICROSCOPIC
Bilirubin Urine: NEGATIVE
Glucose, UA: 100 mg/dL — AB
Hgb urine dipstick: NEGATIVE
Ketones, ur: NEGATIVE mg/dL
Nitrite: NEGATIVE
Protein, ur: NEGATIVE mg/dL
Specific Gravity, Urine: <=1.005 (ref 1.005–1.030)
pH: 6 (ref 5.0–8.0)

## 2021-02-07 LAB — COMPREHENSIVE METABOLIC PANEL
ALT: 14 U/L (ref 0–44)
AST: 16 U/L (ref 15–41)
Albumin: 3.4 g/dL — ABNORMAL LOW (ref 3.5–5.0)
Alkaline Phosphatase: 78 U/L (ref 38–126)
Anion gap: 8 (ref 5–15)
BUN: 89 mg/dL — ABNORMAL HIGH (ref 8–23)
CO2: 30 mmol/L (ref 22–32)
Calcium: 10.5 mg/dL — ABNORMAL HIGH (ref 8.9–10.3)
Chloride: 99 mmol/L (ref 98–111)
Creatinine, Ser: 2.59 mg/dL — ABNORMAL HIGH (ref 0.44–1.00)
GFR, Estimated: 17 mL/min — ABNORMAL LOW (ref >60)
Glucose, Bld: 241 mg/dL — ABNORMAL HIGH (ref 70–99)
Potassium: 4.4 mmol/L (ref 3.5–5.1)
Sodium: 137 mmol/L (ref 135–145)
Total Bilirubin: 0.6 mg/dL (ref 0.3–1.2)
Total Protein: 6.7 g/dL (ref 6.5–8.1)

## 2021-02-07 LAB — CBG MONITORING, ED: Glucose-Capillary: 218 mg/dL — ABNORMAL HIGH (ref 70–99)

## 2021-02-07 LAB — PREPARE RBC (CROSSMATCH)

## 2021-02-07 LAB — POC OCCULT BLOOD, ED: Fecal Occult Bld: NEGATIVE

## 2021-02-07 LAB — BRAIN NATRIURETIC PEPTIDE: B Natriuretic Peptide: 265.8 pg/mL — ABNORMAL HIGH (ref 0.0–100.0)

## 2021-02-07 LAB — RESP PANEL BY RT-PCR (FLU A&B, COVID) ARPGX2
Influenza A by PCR: NEGATIVE
Influenza B by PCR: NEGATIVE
SARS Coronavirus 2 by RT PCR: NEGATIVE

## 2021-02-07 LAB — TROPONIN I (HIGH SENSITIVITY): Troponin I (High Sensitivity): 66 ng/L — ABNORMAL HIGH (ref <18)

## 2021-02-07 MED ORDER — HYDRALAZINE HCL 50 MG PO TABS
50.0000 mg | ORAL_TABLET | Freq: Three times a day (TID) | ORAL | Status: DC
  Administered 2021-02-07 – 2021-02-13 (×15): 50 mg via ORAL
  Filled 2021-02-07 (×2): qty 1
  Filled 2021-02-07: qty 2
  Filled 2021-02-07 (×10): qty 1
  Filled 2021-02-07: qty 2
  Filled 2021-02-07 (×3): qty 1

## 2021-02-07 MED ORDER — SODIUM CHLORIDE 0.9 % IV SOLN
10.0000 mL/h | Freq: Once | INTRAVENOUS | Status: AC
  Administered 2021-02-10: 10 mL/h via INTRAVENOUS

## 2021-02-07 MED ORDER — SODIUM CHLORIDE 0.9 % IV SOLN: INTRAVENOUS | Status: DC

## 2021-02-07 MED ORDER — AMLODIPINE BESYLATE 5 MG PO TABS
2.5000 mg | ORAL_TABLET | Freq: Every day | ORAL | Status: DC
  Administered 2021-02-08 – 2021-02-10 (×3): 2.5 mg via ORAL
  Filled 2021-02-07 (×3): qty 1

## 2021-02-07 MED ORDER — ACETAMINOPHEN 650 MG RE SUPP: 650.0000 mg | Freq: Four times a day (QID) | RECTAL | Status: DC | PRN

## 2021-02-07 MED ORDER — ISOSORBIDE MONONITRATE ER 30 MG PO TB24
30.0000 mg | ORAL_TABLET | Freq: Every day | ORAL | Status: DC
  Administered 2021-02-08 – 2021-02-13 (×6): 30 mg via ORAL
  Filled 2021-02-07 (×6): qty 1

## 2021-02-07 MED ORDER — ONDANSETRON HCL 4 MG PO TABS
4.0000 mg | ORAL_TABLET | Freq: Four times a day (QID) | ORAL | Status: DC | PRN
  Filled 2021-02-07: qty 1

## 2021-02-07 MED ORDER — ACETAMINOPHEN 325 MG PO TABS: 650.0000 mg | ORAL_TABLET | Freq: Four times a day (QID) | ORAL | Status: DC | PRN

## 2021-02-07 MED ORDER — FUROSEMIDE 40 MG PO TABS
40.0000 mg | ORAL_TABLET | Freq: Every day | ORAL | Status: DC
  Administered 2021-02-08 – 2021-02-13 (×6): 40 mg via ORAL
  Filled 2021-02-07 (×6): qty 1

## 2021-02-07 MED ORDER — ALBUTEROL SULFATE (2.5 MG/3ML) 0.083% IN NEBU
2.5000 mg | INHALATION_SOLUTION | RESPIRATORY_TRACT | Status: DC | PRN
  Administered 2021-02-08: 2.5 mg via RESPIRATORY_TRACT
  Filled 2021-02-07: qty 3

## 2021-02-07 MED ORDER — CLONIDINE HCL 0.2 MG PO TABS
0.2000 mg | ORAL_TABLET | Freq: Two times a day (BID) | ORAL | Status: DC
  Administered 2021-02-08 – 2021-02-09 (×3): 0.2 mg via ORAL
  Filled 2021-02-07: qty 2
  Filled 2021-02-07 (×2): qty 1

## 2021-02-07 MED ORDER — INSULIN ASPART 100 UNIT/ML IJ SOLN
0.0000 [IU] | Freq: Three times a day (TID) | INTRAMUSCULAR | Status: DC
  Administered 2021-02-08: 2 [IU] via SUBCUTANEOUS
  Administered 2021-02-08: 7 [IU] via SUBCUTANEOUS
  Administered 2021-02-08: 2 [IU] via SUBCUTANEOUS
  Filled 2021-02-07: qty 0.09

## 2021-02-07 MED ORDER — ONDANSETRON HCL 4 MG/2ML IJ SOLN
4.0000 mg | Freq: Four times a day (QID) | INTRAMUSCULAR | Status: DC | PRN
  Administered 2021-02-10: 4 mg via INTRAVENOUS
  Filled 2021-02-07: qty 2

## 2021-02-07 MED ORDER — PANTOPRAZOLE SODIUM 40 MG IV SOLR
40.0000 mg | Freq: Two times a day (BID) | INTRAVENOUS | Status: DC
  Administered 2021-02-08 – 2021-02-13 (×11): 40 mg via INTRAVENOUS
  Filled 2021-02-07 (×10): qty 40

## 2021-02-07 MED ORDER — SODIUM CHLORIDE 0.9% FLUSH
3.0000 mL | Freq: Two times a day (BID) | INTRAVENOUS | Status: DC
  Administered 2021-02-08 – 2021-02-13 (×10): 3 mL via INTRAVENOUS

## 2021-02-07 MED ORDER — PANTOPRAZOLE 80MG IVPB - SIMPLE MED
80.0000 mg | Freq: Once | INTRAVENOUS | Status: AC
  Administered 2021-02-07: 80 mg via INTRAVENOUS
  Filled 2021-02-07: qty 80

## 2021-02-07 NOTE — ED Provider Notes (Signed)
Seen after prior EDP.  Patient's labs have resulted.  Patient with worsening anemia with hemoglobin today of 6.3.  Patient denies any recent bleeding events.  Patient is on Eliquis.  Stool guaiac obtained today in the ED is negative.  Patient with presentation concerning for likely symptomatic anemia.  Patient would benefit from transfusion.  Patient agrees with plan to transfuse.  Hospitalist services awre of case and will evaluate.   Valarie Merino, MD 02/07/21 [phone]

## 2021-02-07 NOTE — ED Triage Notes (Signed)
Pt BIB [facility] from home for [facility] and productive cough x2 days. Reports clear mucus. Endorses weakness. Expiratory wheezes. Caregiver gave breathing tx but little effect. Duoneb admin en route. A&Ox4.  BP 151/80 HR 110 SpO2 93% RA increased to 100% after duoneb CBG 219

## 2021-02-07 NOTE — ED Notes (Signed)
Notified provider of critical HgB 6.3

## 2021-02-07 NOTE — ED Provider Notes (Signed)
[facility] DEPT Provider Note   CSN: [id number] Arrival date & time: 02/07/21  1048     History Chief Complaint  Patient presents with   Shortness of Breath    Sabrina [name] is a 87 y.o. female.  Patient is a DNR.  Patient brought in by EMS from home for shortness of breath and productive cough for 2 days.  Reports clear mucus.  Apparently was confused yesterday.  But not confused now.  Had wheezing.  Caregiver says breathing treatments had little effect.  DuoNeb admitted to in route by EMS patient alert and oriented x3.  Oxygen sats on room air were 93% according to EMS been increased to 100% after DuoNeb.  No wheezing noted upon listening to her here.  Vital signs significant for temp 98.1 pulse 113 respiration 24 blood pressure 114/82 and oxygen sats 99%.  Patient's past medical history is significant for that she is on Eliquis she has had DVT pulmonary embolus in 2015.  Also has chronic kidney disease stage III type 2 diabetes.  On November 29 had closed fracture of distal right fibula and has follow-up with [facility].  Patient is in a walking boot.  Patient was also admitted October 17 through October 21 for shortness of breath.  Admitting diagnosis was acute respiratory failure with hypoxia and chronic diastolic congestive heart failure.      Past Medical History:  Diagnosis Date   Arthritis    "mostly in my hands, lower back" (06/25/2017)   Basal cell carcinoma (BCC) of face 1983   Breast cancer, right breast ([place]) 03/2013   Chronic kidney disease (CKD), stage III (moderate) ([place])    nephrologist, Dr. [name]   Dental crowns present    DVT (deep venous thrombosis) ([place]) ~ 06/2013   "? side"   Gout    "on daily RX" (06/25/2017)   Heart murmur    no known problems; states did not know she had murmur until age 71   High cholesterol    Hypertension    fluctuates, especially when stressed; has been on med. > 20 yr.    Immature cataract    Non-insulin dependent type 2 diabetes mellitus ([place])    Personal history of chemotherapy    Personal history of radiation therapy    Pulmonary embolism ([place]) ~ 06/2013   Radiation 09/06/13-10/20/13   Right Breast Cancer   Stroke ([place]) 05/2017   just visual problems since (06/25/2017)   Wears partial dentures    lower    Patient Active Problem List   Diagnosis Date Noted   Nonspecific abnormal electrocardiogram (ECG) (EKG)    Dyspnea 12/18/2020   Abnormal gait 05/18/2020   Body mass index (BMI) 35.0-35.9, adult 05/18/2020   Cardiac pacemaker in situ 05/18/2020   Diabetic nephropathy ([place]) 05/18/2020   IgM monoclonal gammopathy of uncertain significance 05/18/2020   Malignant hypertensive chronic kidney disease 05/18/2020   Morbid obesity ([place]) 05/18/2020   Thrombophilia ([place]) 05/18/2020   Secondary hypercoagulable state ([place]) 08/16/2019   Osteoporosis 05/04/2019   Hypercalcemia 11/04/2018   History of stroke 11/04/2018   Paroxysmal atrial fibrillation ([place]) 11/04/2018   Acute kidney injury ([place])    Acute metabolic encephalopathy 09/73/5329   Personal history of DVT and pulm embolus 10/26/2018   Personal history of other venous thrombosis and embolism 10/26/2018   UTI (urinary tract infection) 10/24/2018   Clavicle fracture 09/26/2018   Degeneration of lumbar intervertebral disc 10/01/2017   Blurry vision, bilateral  Acute blood loss anemia    History of breast cancer 05/30/2017   Physical debility 05/30/2017   Occipital infarction ([place]) 05/30/2017   Pressure injury of skin 05/28/2017   Fall at home 05/27/2017   CKD (chronic kidney disease), stage IV ([place]) 05/27/2017   High cholesterol 05/27/2017   DM (diabetes mellitus) ([place]) 05/27/2017   Essential hypertension 05/27/2017   Gout 05/27/2017   Elevated troponin 05/27/2017   Lumbar radiculopathy 05/15/2017   Pain of right calf 04/30/2017   Asymmetrical left sensorineural hearing loss 02/14/2016    Impacted cerumen of left ear 02/14/2016   Lipoma of lower extremity 09/12/2014   Abnormal x-ray 03/15/2014   Chronic diastolic congestive heart failure ([place]) 02/23/2014   Candidiasis of skin 01/13/2014   Osteopenia 11/30/2013   Hypoglycemia 09/13/2013   Acute respiratory failure with hypoxia ([place]) 06/14/2013   History of pulmonary embolism 06/10/2013   Nausea and vomiting 06/10/2013   Accelerated hypertension 06/10/2013   Candidiasis of vagina 05/19/2013   Aortic stenosis 04/22/2013   Edema leg 04/22/2013   Breast cancer of upper-outer quadrant of right female breast ([place]); in remission 03/19/2013    Past Surgical History:  Procedure Laterality Date   AXILLARY LYMPH NODE DISSECTION Right 03/30/2013   Procedure: AXILLARY LYMPH NODE DISSECTION;  Surgeon: Rolm Bookbinder, MD;  Location: [facility];  Service: General;  Laterality: Right;   BASAL CELL CARCINOMA EXCISION  1983   "face"   BREAST BIOPSY Right 03/17/2013   BREAST CYST EXCISION Right 11/1958   benign   BREAST LUMPECTOMY Right 03/30/2013   BREAST LUMPECTOMY WITH NEEDLE LOCALIZATION Right 03/30/2013   Procedure: BREAST LUMPECTOMY WITH NEEDLE LOCALIZATION;  Surgeon: Rolm Bookbinder, MD;  Location: [facility];  Service: General;  Laterality: Right;   DILATION AND CURETTAGE OF UTERUS     LOOP RECORDER INSERTION N/A 08/15/2017   Procedure: LOOP RECORDER INSERTION;  Surgeon: Evans Lance, MD;  Location: [facility];  Service: Cardiovascular;  Laterality: N/A;   PORT-A-CATH REMOVAL  2016   PORTACATH PLACEMENT N/A 04/15/2013   Procedure: INSERTION PORT-A-CATH;  Surgeon: Rolm Bookbinder, MD;  Location: [facility];  Service: General;  Laterality: N/A;   RE-EXCISION OF BREAST CANCER,SUPERIOR MARGINS Right 04/15/2013   Procedure: RE-EXCISION OF RIGHT BREAST  MARGINS;  Surgeon: Rolm Bookbinder, MD;  Location: [facility];  Service: General;  Laterality: Right;   TONSILLECTOMY  ~ 1935/1936     OB History    No obstetric history on file.    Obstetric Comments  Menarche ae 14 No children Married 2 years         Family History  Problem Relation Age of Onset   Pneumonia Mother    Heart attack Father    Breast cancer Other 36       niece   Breast cancer Other 90       niece   Ovarian cancer Other        niece    Social History   Tobacco Use   Smoking status: Never   Smokeless tobacco: Never   Tobacco comments:    only smoked 2 packs cigarettes total; husband quit in 1971  Vaping Use   Vaping Use: Never used  Substance Use Topics   Alcohol use: Not Currently    Alcohol/week: 3.0 standard drinks    Types: 3 Glasses of wine per week   Drug use: No    Home Medications Prior to Admission medications   Medication Sig Start Date  End Date Taking? Authorizing Provider  albuterol (VENTOLIN HFA) 108 (90 Base) MCG/ACT inhaler Inhale 1-2 puffs into the lungs every 6 (six) hours as needed for wheezing or shortness of breath. 03/22/19   [provider]  allopurinol (ZYLOPRIM) 100 MG tablet Take 100 mg by mouth daily. 01/17/16   [provider]  amLODipine (NORVASC) 2.5 MG tablet Take 2.5 mg by mouth daily. 04/11/20   [provider]  cetirizine (ZYRTEC) 10 MG tablet Take 10 mg by mouth daily.    [provider]  cholecalciferol (VITAMIN D) 1000 UNITS tablet Take 2,000 Units by mouth daily.    [provider]  cloNIDine (CATAPRES) 0.2 MG tablet Take 0.2 mg by mouth 2 (two) times daily.    [provider]  denosumab (PROLIA) 60 MG/ML SOSY injection Inject 60 mg into the skin every 6 (six) months. Patient not taking: No sig reported    [provider]  Dextromethorphan-guaiFENesin (MUCINEX DM PO) Take 30 mLs by mouth daily as needed (cough).    [provider]  ELIQUIS 2.5 MG TABS tablet TAKE 1 TABLET BY MOUTH TWICE DAILY. Patient taking differently: Take 2.5 mg by mouth 2 (two) times daily. 07/04/20   Fenton, Clint R,  PA  famotidine (PEPCID) 20 MG tablet Take 20 mg by mouth daily. 11/09/19   [provider]  furosemide (LASIX) 40 MG tablet Take 40 mg by mouth daily. Per Dr. If over 166lb take 80mg  a day    [provider]  gabapentin (NEURONTIN) 100 MG capsule Take 3 capsules (300 mg total) by mouth at bedtime. Take 200 mg by mouth twice a day and take 300 mg by mouth at bedtime Patient taking differently: Take 200-300 mg by mouth See admin instructions. Take 200 mg by mouth twice a day and take 300 mg by mouth at bedtime 11/06/18   Mariel Aloe, MD  glipiZIDE (GLUCOTROL) 5 MG tablet Take 5 mg by mouth 2 (two) times daily before a meal.     [provider]  hydrALAZINE (APRESOLINE) 50 MG tablet Take 50 mg by mouth 3 (three) times daily.    [provider]  hydrocortisone cream 1 % Apply 1 application topically daily as needed for itching.    [provider]  isosorbide mononitrate (IMDUR) 30 MG 24 hr tablet Take 1 tablet (30 mg total) by mouth daily. 12/22/20 01/21/21  Pokhrel, Corrie Mckusick, MD  polyethylene glycol (MIRALAX / GLYCOLAX) packet Take 17 g by mouth daily.    [provider]  predniSONE (STERAPRED UNI-PAK 21 TAB) 10 MG (21) TBPK tablet 3 tab orally daily x 2 days then 2 tab po daily x 2 days then 1 tab po daily x 2 days 12/22/20   Pokhrel, Corrie Mckusick, MD  traMADol (ULTRAM) 50 MG tablet Take 50 mg by mouth 2 (two) times daily as needed for moderate pain.     [provider]  TRUE METRIX BLOOD GLUCOSE TEST test strip  07/19/19   [provider]  TRUEplus Lancets 28G MISC Apply topically. 08/20/19   [provider]    Allergies    Beta adrenergic blockers  Review of Systems   Review of Systems  Constitutional:  Negative for chills and fever.  HENT:  Positive for congestion. Negative for ear pain and sore throat.   Eyes:  Negative for pain and visual disturbance.  Respiratory:  Positive for shortness of breath and wheezing.  Negative for cough.   Cardiovascular:  Negative for chest  pain and palpitations.  Gastrointestinal:  Negative for abdominal pain and vomiting.  Genitourinary:  Negative for dysuria and hematuria.  Musculoskeletal:  Negative for arthralgias and back pain.  Skin:  Negative for color change and rash.  Neurological:  Negative for seizures and syncope.  All other systems reviewed and are negative.  Physical Exam Updated Vital Signs BP (!) 125/53   Pulse 87   Temp 98.1 F (36.7 C) (Oral)   Resp (!) 22   Ht 1.473 m (4\' 10" )   Wt 74.8 kg   SpO2 97%   BMI 34.47 kg/m   Physical Exam Vitals and nursing note reviewed.  Constitutional:      General: She is not in acute distress.    Appearance: Normal appearance. She is well-developed.  HENT:     Head: Normocephalic and atraumatic.  Eyes:     Extraocular Movements: Extraocular movements intact.     Conjunctiva/sclera: Conjunctivae normal.     Pupils: Pupils are equal, round, and reactive to light.  Cardiovascular:     Rate and Rhythm: Regular rhythm. Tachycardia present.     Heart sounds: No murmur heard. Pulmonary:     Effort: Pulmonary effort is normal. No respiratory distress.     Breath sounds: Rhonchi present. No wheezing or rales.  Abdominal:     Palpations: Abdomen is soft.     Tenderness: There is no abdominal tenderness.  Musculoskeletal:        General: No swelling.     Cervical back: Normal range of motion and neck supple.  Skin:    General: Skin is warm and dry.     Capillary Refill: Capillary refill takes less than 2 seconds.  Neurological:     General: No focal deficit present.     Mental Status: She is alert and oriented to person, place, and time.     Cranial Nerves: No cranial nerve deficit.     Sensory: No sensory deficit.     Motor: No weakness.  Psychiatric:        Mood and Affect: Mood normal.    ED Results / Procedures / Treatments   Labs (all labs ordered are listed, but only abnormal results are  displayed) Labs Reviewed  RESP PANEL BY RT-PCR (FLU A&B, COVID) ARPGX2  CBC WITH DIFFERENTIAL/PLATELET  COMPREHENSIVE METABOLIC PANEL  URINALYSIS, ROUTINE W REFLEX MICROSCOPIC  TROPONIN I (HIGH SENSITIVITY)  TROPONIN I (HIGH SENSITIVITY)    EKG EKG Interpretation  Date/Time:  Wednesday February 07 2021 11:04:50 EST Ventricular Rate:  113 PR Interval:  181 QRS Duration: 87 QT Interval:  331 QTC Calculation: 454 R Axis:   70 Text Interpretation: Sinus tachycardia Repol abnrm, severe global ischemia (LM/MVD) New since previous tracing Confirmed by Fredia Sorrow [phone]) on 02/07/2021 11:20:23 AM  Radiology DG Chest 2 View  Result Date: 02/07/2021 CLINICAL DATA:  Shortness of breath, productive cough EXAM: CHEST - 2 VIEW COMPARISON:  Chest radiograph 12/18/2020 FINDINGS: A cardiac loop recorder is again seen. The cardiomediastinal silhouette is stable. There are dense mitral annular calcifications. There is slight asymmetric elevation of the right hemidiaphragm, unchanged. There is no focal consolidation or pulmonary edema. There is no pleural effusion or pneumothorax There is no acute osseous abnormality. Right axillary surgical clips are again noted. IMPRESSION: 1. No radiographic evidence of acute cardiopulmonary process. 2. Dense mitral annular calcifications. Electronically Signed   By: Valetta Mole M.D.   On: 02/07/2021 13:46    Procedures Procedures   Medications Ordered in ED  Medications  0.9 %  sodium chloride infusion (has no administration in time range)    ED Course  I have reviewed the triage vital signs and the nursing notes.  Pertinent labs & imaging results that were available during my care of the patient were reviewed by me and considered in my medical decision making (see chart for details).    MDM Rules/Calculators/A&P                          Respiratory panel is COVID and influenza testing is negative.  Chest x-ray no radiographic evidence of acute  cardiopulmonary process.  Patient EKG showed some diffuse ST segment depression that could be ischemia.  Troponins have been ordered.  Considered BNP.  We will go ahead and added on but chest x-ray seems to be very nonsuggestive of pulmonary embolism.  Patient is a little tachycardic.  But oxygen sats are good.  Did not hear any wheezing.  But she had just received a breathing treatment prior.  Other concerns for this patient can the congestive heart failure.  Also although on blood thinners could be at risk for pulmonary embolism.  Not sure if patient's renal function would allow CT angio.  Reassessment and judgment can be used on that.   Final Clinical Impression(s) / ED Diagnoses Final diagnoses:  SOB (shortness of breath)    Rx / DC Orders ED Discharge Orders     None        Fredia Sorrow, MD 02/07/21 1512

## 2021-02-07 NOTE — H&P (Signed)
History and Physical    Sabrina [name] XVQ:[id number] DOB: 11/21/1926 DOA: 02/07/2021  PCP: [name], MD  Patient coming from: Home  I have personally briefly reviewed patient's old medical records in [place]  Chief Complaint: Shortness of breath  HPI: Sabrina [name] is a 87 y.o. female with medical history significant for paroxysmal atrial fibrillation on Eliquis, chronic diastolic CHF (EF 09%), CKD stage IV, history of CVA, PE/DVT, T2DM, HTN, HLD, right breast cancer, recent distal fibular fracture who presented to the ED for evaluation of shortness of breath.  Caretakers at bedside provides additional history.  Patient initially presented to the ED for complaint of nonproductive cough, wheezing, intermittent shortness of breath.  EMS were called and she was given a DuoNeb treatment with relief.  She has also been feeling fatigued and generally weak.  She has a walking boot in place on her right leg due to recent distal fibular fracture.  She is largely using a wheelchair for transport but will transfer on her own.  Hemoglobin in the ED is 6.3 compared to 9.8 on 01/30/2021.  Has been on anticoagulation with Eliquis for history of PAF.  Caretaker notes that she had dark almost black appearing stool 2 days prior to arrival.  Patient otherwise denies any obvious bleeding including epistaxis, hemoptysis, hematemesis, hematuria, or hematochezia.  She otherwise denies chest pain, nausea, vomiting, abdominal pain, dysuria, diarrhea.  ED Course:  Initial vitals showed BP 114/82, pulse 113, RR 24, temp 98.1 F, SPO2 99% on room air.  Labs show hemoglobin 6.3 (previous 9.8 on 01/30/2021), WBC 10.9, platelets 294,000, sodium 137, potassium 4.4, bicarb 30, BUN 89, creatinine 2.59, serum glucose 241, LFTs within normal limits, troponin 66, BNP 265.8.  SARS-CoV-2 and influenza PCR negative.  FOBT negative per EDP.  Daily chest x-ray negative for focal consolidation, edema,  effusion.  Cardiac loop recorder seen in place.  Dense mitral annular calcifications noted.  Patient was given IV Protonix 80 mg and ordered to receive 1 unit PRBC transfusion.  The hospitalist service was consulted to admit for further evaluation and management.  Review of Systems: All systems reviewed and are negative except as documented in history of present illness above.   Past Medical History:  Diagnosis Date   Arthritis    "mostly in my hands, lower back" (06/25/2017)   Basal cell carcinoma (BCC) of face 1983   Breast cancer, right breast ([place]) 03/2013   Chronic kidney disease (CKD), stage III (moderate) ([place])    nephrologist, Dr. [name]   Dental crowns present    DVT (deep venous thrombosis) ([place]) ~ 06/2013   "? side"   Gout    "on daily RX" (06/25/2017)   Heart murmur    no known problems; states did not know she had murmur until age 18   High cholesterol    Hypertension    fluctuates, especially when stressed; has been on med. > 20 yr.   Immature cataract    Non-insulin dependent type 2 diabetes mellitus ([place])    Personal history of chemotherapy    Personal history of radiation therapy    Pulmonary embolism ([place]) ~ 06/2013   Radiation 09/06/13-10/20/13   Right Breast Cancer   Stroke ([place]) 05/2017   just visual problems since (06/25/2017)   Wears partial dentures    lower    Past Surgical History:  Procedure Laterality Date   AXILLARY LYMPH NODE DISSECTION Right 03/30/2013   Procedure: AXILLARY LYMPH NODE DISSECTION;  Surgeon:  Rolm Bookbinder, MD;  Location: [facility];  Service: General;  Laterality: Right;   BASAL CELL CARCINOMA EXCISION  1983   "face"   BREAST BIOPSY Right 03/17/2013   BREAST CYST EXCISION Right 11/1958   benign   BREAST LUMPECTOMY Right 03/30/2013   BREAST LUMPECTOMY WITH NEEDLE LOCALIZATION Right 03/30/2013   Procedure: BREAST LUMPECTOMY WITH NEEDLE LOCALIZATION;  Surgeon: Rolm Bookbinder, MD;  Location: [facility];  Service: General;   Laterality: Right;   DILATION AND CURETTAGE OF UTERUS     LOOP RECORDER INSERTION N/A 08/15/2017   Procedure: LOOP RECORDER INSERTION;  Surgeon: Evans Lance, MD;  Location: [facility];  Service: Cardiovascular;  Laterality: N/A;   PORT-A-CATH REMOVAL  2016   PORTACATH PLACEMENT N/A 04/15/2013   Procedure: INSERTION PORT-A-CATH;  Surgeon: Rolm Bookbinder, MD;  Location: [facility];  Service: General;  Laterality: N/A;   RE-EXCISION OF BREAST CANCER,SUPERIOR MARGINS Right 04/15/2013   Procedure: RE-EXCISION OF RIGHT BREAST  MARGINS;  Surgeon: Rolm Bookbinder, MD;  Location: [facility];  Service: General;  Laterality: Right;   TONSILLECTOMY  ~ 1935/1936    Social History:  reports that she has never smoked. She has never used smokeless tobacco. She reports that she does not currently use alcohol after a past usage of about 3.0 standard drinks per week. She reports that she does not use drugs.  Allergies  Allergen Reactions   Beta Adrenergic Blockers Other (See Comments)    Unknown    Family History  Problem Relation Age of Onset   Pneumonia Mother    Heart attack Father    Breast cancer Other 34       niece   Breast cancer Other 52       niece   Ovarian cancer Other        niece     Prior to Admission medications   Medication Sig Start Date End Date Taking? Authorizing Provider  albuterol (VENTOLIN HFA) 108 (90 Base) MCG/ACT inhaler Inhale 1-2 puffs into the lungs every 6 (six) hours as needed for wheezing or shortness of breath. 03/22/19   [provider]  allopurinol (ZYLOPRIM) 100 MG tablet Take 100 mg by mouth daily. 01/17/16   [provider]  amLODipine (NORVASC) 2.5 MG tablet Take 2.5 mg by mouth daily. 04/11/20   [provider]  cetirizine (ZYRTEC) 10 MG tablet Take 10 mg by mouth daily.    [provider]  cholecalciferol (VITAMIN D) 1000 UNITS tablet Take 2,000 Units by mouth daily.     [provider]  cloNIDine (CATAPRES) 0.2 MG tablet Take 0.2 mg by mouth 2 (two) times daily.    [provider]  denosumab (PROLIA) 60 MG/ML SOSY injection Inject 60 mg into the skin every 6 (six) months. Patient not taking: No sig reported    [provider]  Dextromethorphan-guaiFENesin (MUCINEX DM PO) Take 30 mLs by mouth daily as needed (cough).    [provider]  ELIQUIS 2.5 MG TABS tablet TAKE 1 TABLET BY MOUTH TWICE DAILY. Patient taking differently: Take 2.5 mg by mouth 2 (two) times daily. 07/04/20   Fenton, Clint R, PA  famotidine (PEPCID) 20 MG tablet Take 20 mg by mouth daily. 11/09/19   [provider]  furosemide (LASIX) 40 MG tablet Take 40 mg by mouth daily. Per Dr. If over 166lb take 80mg  a day    [provider]  gabapentin (NEURONTIN) 100 MG capsule Take 3 capsules (  300 mg total) by mouth at bedtime. Take 200 mg by mouth twice a day and take 300 mg by mouth at bedtime Patient taking differently: Take 200-300 mg by mouth See admin instructions. Take 200 mg by mouth twice a day and take 300 mg by mouth at bedtime 11/06/18   Mariel Aloe, MD  glipiZIDE (GLUCOTROL) 5 MG tablet Take 5 mg by mouth 2 (two) times daily before a meal.     [provider]  hydrALAZINE (APRESOLINE) 50 MG tablet Take 50 mg by mouth 3 (three) times daily.    [provider]  hydrocortisone cream 1 % Apply 1 application topically daily as needed for itching.    [provider]  isosorbide mononitrate (IMDUR) 30 MG 24 hr tablet Take 1 tablet (30 mg total) by mouth daily. 12/22/20 01/21/21  Pokhrel, Corrie Mckusick, MD  polyethylene glycol (MIRALAX / GLYCOLAX) packet Take 17 g by mouth daily.    [provider]  predniSONE (STERAPRED UNI-PAK 21 TAB) 10 MG (21) TBPK tablet 3 tab orally daily x 2 days then 2 tab po daily x 2 days then 1 tab po daily x 2 days 12/22/20   Pokhrel, Corrie Mckusick, MD  traMADol (ULTRAM) 50 MG tablet Take 50 mg by  mouth 2 (two) times daily as needed for moderate pain.     [provider]  TRUE METRIX BLOOD GLUCOSE TEST test strip  07/19/19   [provider]  TRUEplus Lancets 28G MISC Apply topically. 08/20/19   [provider]    Physical Exam: Vitals:   02/07/21 1445 02/07/21 1610 02/07/21 1630 02/07/21 1800  BP: (!) 144/53 (!) 141/59 (!) 132/58 (!) 149/62  Pulse: 81 85 84 92  Resp: 17 19 20 17   Temp:      TempSrc:      SpO2: 98% 98% 99% 96%  Weight:      Height:       Constitutional: Elderly woman resting in bed, NAD, calm, comfortable Eyes: PERRL, lids and conjunctivae normal ENMT: Mucous membranes are moist. Posterior pharynx clear of any exudate or lesions.Normal dentition.  Neck: normal, supple, no masses. Respiratory: clear to auscultation bilaterally, no wheezing, no crackles. Normal respiratory effort. No accessory muscle use.  Cardiovascular: Regular rate and rhythm, no murmurs / rubs / gallops. No extremity edema. 2+ pedal pulses. Abdomen: no tenderness, no masses palpated. No hepatosplenomegaly. Bowel sounds positive.  Musculoskeletal: CAM Walker boot in place right lower extremity.  No clubbing / cyanosis. No joint deformity upper and lower extremities. Good ROM, no contractures. Normal muscle tone.  Skin: no rashes, lesions, ulcers. No induration Neurologic: CN 2-12 grossly intact. Sensation intact. Strength 5/5 in all 4.  Psychiatric: Normal judgment and insight. Alert and oriented x 3. Normal mood.   Labs on Admission: I have personally reviewed following labs and imaging studies  CBC: Recent Labs  Lab 02/07/21 1611  WBC 10.9*  NEUTROABS 5.8  HGB 6.3*  HCT 20.1*  MCV 101.5*  PLT 053   Basic Metabolic Panel: Recent Labs  Lab 02/07/21 1611  NA 137  K 4.4  CL 99  CO2 30  GLUCOSE 241*  BUN 89*  CREATININE 2.59*  CALCIUM 10.5*   GFR: Estimated Creatinine Clearance: 11.9 mL/min (A) (by C-G formula based on SCr of 2.59 mg/dL (H)). Liver  Function Tests: Recent Labs  Lab 02/07/21 1611  AST 16  ALT 14  ALKPHOS 78  BILITOT 0.6  PROT 6.7  ALBUMIN 3.4*   No results  for input(s): LIPASE, AMYLASE in the last 168 hours. No results for input(s): AMMONIA in the last 168 hours. Coagulation Profile: No results for input(s): INR, PROTIME in the last 168 hours. Cardiac Enzymes: No results for input(s): CKTOTAL, CKMB, CKMBINDEX, TROPONINI in the last 168 hours. BNP (last 3 results) No results for input(s): PROBNP in the last 8760 hours. HbA1C: No results for input(s): HGBA1C in the last 72 hours. CBG: No results for input(s): GLUCAP in the last 168 hours. Lipid Profile: No results for input(s): CHOL, HDL, LDLCALC, TRIG, CHOLHDL, LDLDIRECT in the last 72 hours. Thyroid Function Tests: No results for input(s): TSH, T4TOTAL, FREET4, T3FREE, THYROIDAB in the last 72 hours. Anemia Panel: No results for input(s): VITAMINB12, FOLATE, FERRITIN, TIBC, IRON, RETICCTPCT in the last 72 hours. Urine analysis:    Component Value Date/Time   COLORURINE YELLOW 08/02/2020 1814   APPEARANCEUR HAZY (A) 08/02/2020 1814   LABSPEC 1.009 08/02/2020 1814   PHURINE 7.0 08/02/2020 1814   GLUCOSEU NEGATIVE 08/02/2020 1814   HGBUR NEGATIVE 08/02/2020 1814   BILIRUBINUR NEGATIVE 08/02/2020 1814   KETONESUR NEGATIVE 08/02/2020 1814   PROTEINUR 30 (A) 08/02/2020 1814   UROBILINOGEN 0.2 09/13/2013 0523   NITRITE NEGATIVE 08/02/2020 1814   LEUKOCYTESUR LARGE (A) 08/02/2020 1814    Radiological Exams on Admission: DG Chest 2 View  Result Date: 02/07/2021 CLINICAL DATA:  Shortness of breath, productive cough EXAM: CHEST - 2 VIEW COMPARISON:  Chest radiograph 12/18/2020 FINDINGS: A cardiac loop recorder is again seen. The cardiomediastinal silhouette is stable. There are dense mitral annular calcifications. There is slight asymmetric elevation of the right hemidiaphragm, unchanged. There is no focal consolidation or pulmonary edema. There is no  pleural effusion or pneumothorax There is no acute osseous abnormality. Right axillary surgical clips are again noted. IMPRESSION: 1. No radiographic evidence of acute cardiopulmonary process. 2. Dense mitral annular calcifications. Electronically Signed   By: Valetta Mole M.D.   On: 02/07/2021 13:46    EKG: Personally reviewed. Initial EKG showed sinus tachycardia, rate 113, ST depressions in inferior lateral leads.  Repeat EKG in sinus rhythm, rate 91, ST depressions no longer present.  Previous EKG 10/17 also showed sinus tachycardia with similar ST depression changes.  Assessment/Plan Principal Problem:   Symptomatic anemia Active Problems:   Chronic diastolic congestive heart failure (HCC)   CKD (chronic kidney disease), stage IV (HCC)   Type 2 diabetes mellitus with stage 4 chronic kidney disease ([place])   Hypertension associated with diabetes ([place])   Paroxysmal atrial fibrillation ([place])   Sabrina [name] is a 87 y.o. female with medical history significant for paroxysmal atrial fibrillation on Eliquis, chronic diastolic CHF (EF 96%), CKD stage IV, history of CVA, PE/DVT, T2DM, HTN, HLD, right breast cancer, recent distal fibular fracture who is admitted with symptomatic anemia.  Symptomatic anemia: Hemoglobin 6.3 on admission compared to 9.8 01/30/2021 (baseline 9-10).  Question of melena 2 days prior to admission otherwise no obvious bleeding.  FOBT negative.  No recent records of EGD/colonoscopy on file.  Last colonoscopy in system was November 2004 which showed universal colonic diverticulosis. -Transfuse 1 unit PRBC -Repeat CBC in a.m. -Hold Eliquis -Continue IV Protonix 40 mg twice daily  Paroxysmal atrial fibrillation: In sinus rhythm on admission. Rhythm control medications as an outpatient.  Holding Eliquis as above.  Chronic diastolic CHF: Stable and compensated.  Continue oral Lasix 40 mg daily, daily weights, strict I/O's.  CKD stage IV: Relatively stable.  Continue  to monitor.  Elevated  troponin/abnormal EKG: Troponin mildly elevated, likely supply demand mismatch in setting of anemia and CKD 4.  Denies any chest pain.  Initial EKG showed ST depressions which resolved on repeat.  Similar findings seen on prior EKG 10/17.  Patient denies any chest pain.  Seen by cardiology on prior admission who recommended medical management as she is a poor candidate for any invasive or interventional procedures.  She was started on Imdur. -Continue Imdur 30 mg daily  Type II diabetes: Hold home glipizide and place on sensitive SSI.  Hypertension: Resume home amlodipine, clonidine, hydralazine, Imdur.  Distal right fibular fracture: Occurring after prior fall at home.  Seen in ED 11/29 and placed in cam walker which remains in place.  DVT prophylaxis: SCDs Code Status: DNR, confirmed on admission Family Communication: Discussed with patient's caretaker at bedside Disposition Plan: Home and likely discharge to home pending clinical progress Consults called: None Level of care: Telemetry Admission status:  Status is: Observation  The patient remains OBS appropriate and will d/c before 2 midnights.   Zada Finders MD [facility]  If 7PM-7AM, please contact night-coverage [email or web address]  02/07/2021, 6:50 PM

## 2021-02-08 DIAGNOSIS — I160 Hypertensive urgency: Secondary | ICD-10-CM | POA: Diagnosis not present

## 2021-02-08 DIAGNOSIS — I152 Hypertension secondary to endocrine disorders: Secondary | ICD-10-CM | POA: Diagnosis present

## 2021-02-08 DIAGNOSIS — Z853 Personal history of malignant neoplasm of breast: Secondary | ICD-10-CM | POA: Diagnosis not present

## 2021-02-08 DIAGNOSIS — Z66 Do not resuscitate: Secondary | ICD-10-CM | POA: Diagnosis present

## 2021-02-08 DIAGNOSIS — Z8249 Family history of ischemic heart disease and other diseases of the circulatory system: Secondary | ICD-10-CM | POA: Diagnosis not present

## 2021-02-08 DIAGNOSIS — Z8041 Family history of malignant neoplasm of ovary: Secondary | ICD-10-CM | POA: Diagnosis not present

## 2021-02-08 DIAGNOSIS — I480 Paroxysmal atrial fibrillation: Secondary | ICD-10-CM | POA: Diagnosis present

## 2021-02-08 DIAGNOSIS — E1165 Type 2 diabetes mellitus with hyperglycemia: Secondary | ICD-10-CM | POA: Diagnosis not present

## 2021-02-08 DIAGNOSIS — I5032 Chronic diastolic (congestive) heart failure: Secondary | ICD-10-CM | POA: Diagnosis present

## 2021-02-08 DIAGNOSIS — J209 Acute bronchitis, unspecified: Secondary | ICD-10-CM | POA: Diagnosis present

## 2021-02-08 DIAGNOSIS — D649 Anemia, unspecified: Secondary | ICD-10-CM | POA: Diagnosis present

## 2021-02-08 DIAGNOSIS — D72829 Elevated white blood cell count, unspecified: Secondary | ICD-10-CM | POA: Diagnosis present

## 2021-02-08 DIAGNOSIS — E7800 Pure hypercholesterolemia, unspecified: Secondary | ICD-10-CM | POA: Diagnosis present

## 2021-02-08 DIAGNOSIS — E1169 Type 2 diabetes mellitus with other specified complication: Secondary | ICD-10-CM | POA: Diagnosis present

## 2021-02-08 DIAGNOSIS — I130 Hypertensive heart and chronic kidney disease with heart failure and stage 1 through stage 4 chronic kidney disease, or unspecified chronic kidney disease: Secondary | ICD-10-CM | POA: Diagnosis present

## 2021-02-08 DIAGNOSIS — I639 Cerebral infarction, unspecified: Secondary | ICD-10-CM | POA: Diagnosis not present

## 2021-02-08 DIAGNOSIS — Z85828 Personal history of other malignant neoplasm of skin: Secondary | ICD-10-CM | POA: Diagnosis not present

## 2021-02-08 DIAGNOSIS — J42 Unspecified chronic bronchitis: Secondary | ICD-10-CM | POA: Diagnosis present

## 2021-02-08 DIAGNOSIS — E1159 Type 2 diabetes mellitus with other circulatory complications: Secondary | ICD-10-CM | POA: Diagnosis not present

## 2021-02-08 DIAGNOSIS — E871 Hypo-osmolality and hyponatremia: Secondary | ICD-10-CM | POA: Diagnosis present

## 2021-02-08 DIAGNOSIS — Z20822 Contact with and (suspected) exposure to covid-19: Secondary | ICD-10-CM | POA: Diagnosis present

## 2021-02-08 DIAGNOSIS — T380X5A Adverse effect of glucocorticoids and synthetic analogues, initial encounter: Secondary | ICD-10-CM | POA: Diagnosis not present

## 2021-02-08 DIAGNOSIS — E1122 Type 2 diabetes mellitus with diabetic chronic kidney disease: Secondary | ICD-10-CM | POA: Diagnosis present

## 2021-02-08 DIAGNOSIS — Z803 Family history of malignant neoplasm of breast: Secondary | ICD-10-CM | POA: Diagnosis not present

## 2021-02-08 DIAGNOSIS — N184 Chronic kidney disease, stage 4 (severe): Secondary | ICD-10-CM | POA: Diagnosis present

## 2021-02-08 DIAGNOSIS — J470 Bronchiectasis with acute lower respiratory infection: Secondary | ICD-10-CM | POA: Diagnosis present

## 2021-02-08 LAB — CBG MONITORING, ED
Glucose-Capillary: 219 mg/dL — ABNORMAL HIGH (ref 70–99)
Glucose-Capillary: 324 mg/dL — ABNORMAL HIGH (ref 70–99)

## 2021-02-08 LAB — BASIC METABOLIC PANEL
Anion gap: 11 (ref 5–15)
BUN: 77 mg/dL — ABNORMAL HIGH (ref 8–23)
CO2: 27 mmol/L (ref 22–32)
Calcium: 10.7 mg/dL — ABNORMAL HIGH (ref 8.9–10.3)
Chloride: 100 mmol/L (ref 98–111)
Creatinine, Ser: 2.32 mg/dL — ABNORMAL HIGH (ref 0.44–1.00)
GFR, Estimated: 19 mL/min — ABNORMAL LOW (ref >60)
Glucose, Bld: 226 mg/dL — ABNORMAL HIGH (ref 70–99)
Potassium: 4.1 mmol/L (ref 3.5–5.1)
Sodium: 138 mmol/L (ref 135–145)

## 2021-02-08 LAB — CBC
HCT: 30.7 % — ABNORMAL LOW (ref 36.0–46.0)
HCT: 29.8 % — ABNORMAL LOW (ref 36.0–46.0)
Hemoglobin: 9.6 g/dL — ABNORMAL LOW (ref 12.0–15.0)
Hemoglobin: 9.5 g/dL — ABNORMAL LOW (ref 12.0–15.0)
MCH: 31.8 pg (ref 26.0–34.0)
MCH: 32.1 pg (ref 26.0–34.0)
MCHC: 31.3 g/dL (ref 30.0–36.0)
MCHC: 31.9 g/dL (ref 30.0–36.0)
MCV: 101.7 fL — ABNORMAL HIGH (ref 80.0–100.0)
MCV: 100.7 fL — ABNORMAL HIGH (ref 80.0–100.0)
Platelets: 257 10*3/uL (ref 150–400)
Platelets: 269 10*3/uL (ref 150–400)
RBC: 3.02 MIL/uL — ABNORMAL LOW (ref 3.87–5.11)
RBC: 2.96 MIL/uL — ABNORMAL LOW (ref 3.87–5.11)
RDW: 14.9 % (ref 11.5–15.5)
RDW: 14.8 % (ref 11.5–15.5)
WBC: 8.8 10*3/uL (ref 4.0–10.5)
WBC: 8.3 10*3/uL (ref 4.0–10.5)
nRBC: 0 % (ref 0.0–0.2)
nRBC: 0 % (ref 0.0–0.2)

## 2021-02-08 LAB — GLUCOSE, CAPILLARY
Glucose-Capillary: 153 mg/dL — ABNORMAL HIGH (ref 70–99)
Glucose-Capillary: 87 mg/dL (ref 70–99)

## 2021-02-08 LAB — ABO/RH: ABO/RH(D): O NEG

## 2021-02-08 MED ORDER — INSULIN ASPART 100 UNIT/ML IJ SOLN
0.0000 [IU] | Freq: Three times a day (TID) | INTRAMUSCULAR | Status: DC
  Administered 2021-02-09: 11 [IU] via SUBCUTANEOUS
  Administered 2021-02-09: 4 [IU] via SUBCUTANEOUS
  Administered 2021-02-09: 7 [IU] via SUBCUTANEOUS
  Administered 2021-02-10: 15 [IU] via SUBCUTANEOUS
  Administered 2021-02-10: 11 [IU] via SUBCUTANEOUS
  Administered 2021-02-10: 15 [IU] via SUBCUTANEOUS
  Administered 2021-02-11 – 2021-02-12 (×3): 4 [IU] via SUBCUTANEOUS
  Administered 2021-02-12: 7 [IU] via SUBCUTANEOUS

## 2021-02-08 MED ORDER — IPRATROPIUM-ALBUTEROL 0.5-2.5 (3) MG/3ML IN SOLN
3.0000 mL | Freq: Four times a day (QID) | RESPIRATORY_TRACT | Status: DC
  Administered 2021-02-09: 3 mL via RESPIRATORY_TRACT
  Filled 2021-02-08: qty 3

## 2021-02-08 MED ORDER — INSULIN ASPART 100 UNIT/ML IJ SOLN
0.0000 [IU] | Freq: Every day | INTRAMUSCULAR | Status: DC
Start: 2021-02-08 — End: 2021-02-13
  Administered 2021-02-12: 4 [IU] via SUBCUTANEOUS

## 2021-02-08 MED ORDER — BUDESONIDE 0.25 MG/2ML IN SUSP
0.2500 mg | Freq: Two times a day (BID) | RESPIRATORY_TRACT | Status: DC
  Administered 2021-02-09 – 2021-02-13 (×9): 0.25 mg via RESPIRATORY_TRACT
  Filled 2021-02-08 (×9): qty 2

## 2021-02-08 NOTE — ED Notes (Signed)
Caregiver at bedside

## 2021-02-08 NOTE — ED Notes (Signed)
Pt placed on bedpan. Due to pt working hard to get on bedpan, pt O2 dropped to 86% RA. Pt placed on 2L [place].

## 2021-02-08 NOTE — ED Notes (Signed)
Pt sitting up eating breakfast.

## 2021-02-08 NOTE — Progress Notes (Signed)
PROGRESS NOTE    Sabrina [name]  DGU:[id number] DOB: [birth year] DOA: 02/07/2021 PCP: [name], MD    Brief Narrative:  87 year old female with a history of atrial fibrillation on anticoagulation, chronic kidney disease stage IV, hypertension, diabetes, diastolic heart failure, presents to the emergency room with shortness of breath, wheezing.  Chest x-ray did not show any signs of pneumonia or CHF.  She was also noted to have a hemoglobin of 6.3 which is significantly lower than her baseline of 9-10.  Her caregivers report of possible dark stools, although stool did test negative for FOBT in ED.  Admitted for further work-up of anemia and possible bronchitis.   Assessment & Plan:   Principal Problem:   Symptomatic anemia Active Problems:   Chronic diastolic congestive heart failure (HCC)   CKD (chronic kidney disease), stage IV (HCC)   Type 2 diabetes mellitus with stage 4 chronic kidney disease (HCC)   Hypertension associated with diabetes (HCC)   Paroxysmal atrial fibrillation (HCC)   Anemia   Anemia -Noted to have a hemoglobin of 6.3 on admission -Baseline hemoglobin appears to run around 9-10 -1 unit of PRBC was ordered, although I cannot see where this was actually administered the patient She did have a follow-up hemoglobin check this morning and hemoglobin noted to be 9.6 -I question if original hemoglobin of 6.3 may be an error -We will repeat hemoglobin to follow trends  Dark stools -Caregiver reported some dark stool 1 to 2 days prior to admission -Stool for FOBT checked in the emergency room was negative -Currently on Protonix -Monitor for any gross signs of bleeding -If she does develop any gross bleeding, could consider discussing with GI  Paroxysmal atrial fibrillation -Heart rate is currently stable -She is chronically anticoagulated with Eliquis which is currently on hold due to concerns of anemia/bleeding  Chronic kidney disease stage  IV -Creatinine is currently at baseline  Acute bronchitis -Chest x-ray without pneumonia or pulmonary edema -She does have significant wheezing which is similar to her prior presentation -Start on bronchodilators and inhaled steroids -If she does not improve, may need systemic steroids  Chronic diastolic congestive heart failure -Clinically appears to be compensated -Continue on home dose of Lasix  Hypertension -On multiple agents including hydralazine, clonidine, amlodipine -Blood pressure appears to be stable  Diabetes mellitus, type II, uncontrolled with hyperglycemia -Continue on sliding scale insulin -Follow blood sugars   DVT prophylaxis: SCDs Start: 02/07/21 1923  Code Status: DNR Family Communication: Updated patient's niece, [name] over the phone Disposition Plan: Status is: Inpatient  Remains inpatient appropriate because: Continue to monitor hemoglobin and signs of bleeding.  Needs further treatment of acute bronchitis.    Consultants:    Procedures:    Antimicrobials:      Subjective: Patient continues to wheeze, has shortness of breath.  Currently on supplemental oxygen.  It was noted that she was ordered a unit of PRBC, although I cannot see where this has been administered.  She did have a bowel movement earlier today, unclear if this was dark/bloody.  Objective: Vitals:   02/08/21 1430 02/08/21 1530 02/08/21 1545 02/08/21 1722  BP: (!) 86/66 127/68  (!) 164/63  Pulse: 70 74 63 92  Resp: 18 (!) 25 14 18   Temp:   98.2 F (36.8 C) 98.2 F (36.8 C)  TempSrc:   Oral Oral  SpO2: 100% 100% 100% 95%  Weight:      Height:       No intake or output  data in the 24 hours ending 02/08/21 2033 Filed Weights   02/07/21 1103  Weight: 74.8 kg    Examination:  General exam: Appears calm and comfortable  Respiratory system: Bilateral wheezes. Respiratory effort normal. Cardiovascular system: S1 & S2 heard, RRR. No JVD, murmurs, rubs, gallops or clicks.  No pedal edema. Gastrointestinal system: Abdomen is nondistended, soft and nontender. No organomegaly or masses felt. Normal bowel sounds heard. Central nervous system: Alert and oriented. No focal neurological deficits. Extremities: Symmetric 5 x 5 power. Skin: No rashes, lesions or ulcers Psychiatry: Somnolent, but wakes up to voice and answers questions    Data Reviewed: I have personally reviewed following labs and imaging studies  CBC: Recent Labs  Lab 02/07/21 1611 02/08/21 0426  WBC 10.9* 8.8  NEUTROABS 5.8  --   HGB 6.3* 9.6*  HCT 20.1* 30.7*  MCV 101.5* 101.7*  PLT 294 170   Basic Metabolic Panel: Recent Labs  Lab 02/07/21 1611 02/08/21 0426  NA 137 138  K 4.4 4.1  CL 99 100  CO2 30 27  GLUCOSE 241* 226*  BUN 89* 77*  CREATININE 2.59* 2.32*  CALCIUM 10.5* 10.7*   GFR: Estimated Creatinine Clearance: 13.3 mL/min (A) (by C-G formula based on SCr of 2.32 mg/dL (H)). Liver Function Tests: Recent Labs  Lab 02/07/21 1611  AST 16  ALT 14  ALKPHOS 78  BILITOT 0.6  PROT 6.7  ALBUMIN 3.4*   No results for input(s): LIPASE, AMYLASE in the last 168 hours. No results for input(s): AMMONIA in the last 168 hours. Coagulation Profile: No results for input(s): INR, PROTIME in the last 168 hours. Cardiac Enzymes: No results for input(s): CKTOTAL, CKMB, CKMBINDEX, TROPONINI in the last 168 hours. BNP (last 3 results) No results for input(s): PROBNP in the last 8760 hours. HbA1C: No results for input(s): HGBA1C in the last 72 hours. CBG: Recent Labs  Lab 02/07/21 2147 02/08/21 0808 02/08/21 1226 02/08/21 1849  GLUCAP 218* 219* 324* 153*   Lipid Profile: No results for input(s): CHOL, HDL, LDLCALC, TRIG, CHOLHDL, LDLDIRECT in the last 72 hours. Thyroid Function Tests: No results for input(s): TSH, T4TOTAL, FREET4, T3FREE, THYROIDAB in the last 72 hours. Anemia Panel: No results for input(s): VITAMINB12, FOLATE, FERRITIN, TIBC, IRON, RETICCTPCT in the last  72 hours. Sepsis Labs: No results for input(s): PROCALCITON, LATICACIDVEN in the last 168 hours.  Recent Results (from the past 240 hour(s))  Resp Panel by RT-PCR (Flu A&B, Covid) Nasopharyngeal Swab     Status: None   Collection Time: 02/07/21 12:35 PM   Specimen: Nasopharyngeal Swab; Nasopharyngeal(NP) swabs in vial transport medium  Result Value Ref Range Status   SARS Coronavirus 2 by RT PCR NEGATIVE NEGATIVE Final    Comment: (NOTE) SARS-CoV-2 target nucleic acids are NOT DETECTED.  The SARS-CoV-2 RNA is generally detectable in upper respiratory specimens during the acute phase of infection. The lowest concentration of SARS-CoV-2 viral copies this assay can detect is 138 copies/mL. A negative result does not preclude SARS-Cov-2 infection and should not be used as the sole basis for treatment or other patient management decisions. A negative result may occur with  improper specimen collection/handling, submission of specimen other than nasopharyngeal swab, presence of viral mutation(s) within the areas targeted by this assay, and inadequate number of viral copies(<138 copies/mL). A negative result must be combined with clinical observations, patient history, and epidemiological information. The expected result is Negative.  Fact Sheet for Patients:  EntrepreneurPulse.com.au  Fact Sheet for Healthcare Providers:  IncredibleEmployment.be  This test is no t yet approved or cleared by the [place] and  has been authorized for detection and/or diagnosis of SARS-CoV-2 by FDA under an Emergency Use Authorization (EUA). This EUA will remain  in effect (meaning this test can be used) for the duration of the COVID-19 declaration under Section 564(b)(1) of the Act, 21 U.S.C.section 360bbb-3(b)(1), unless the authorization is terminated  or revoked sooner.       Influenza A by PCR NEGATIVE NEGATIVE Final   Influenza B by PCR NEGATIVE  NEGATIVE Final    Comment: (NOTE) The Xpert Xpress SARS-CoV-2/FLU/RSV plus assay is intended as an aid in the diagnosis of influenza from Nasopharyngeal swab specimens and should not be used as a sole basis for treatment. Nasal washings and aspirates are unacceptable for Xpert Xpress SARS-CoV-2/FLU/RSV testing.  Fact Sheet for Patients: EntrepreneurPulse.com.au  Fact Sheet for Healthcare Providers: IncredibleEmployment.be  This test is not yet approved or cleared by the [place] FDA and has been authorized for detection and/or diagnosis of SARS-CoV-2 by FDA under an Emergency Use Authorization (EUA). This EUA will remain in effect (meaning this test can be used) for the duration of the COVID-19 declaration under Section 564(b)(1) of the Act, 21 U.S.C. section 360bbb-3(b)(1), unless the authorization is terminated or revoked.  Performed at [facility], [place] [address], [place], [place] [address]          Radiology Studies: DG Chest 2 View  Result Date: 02/07/2021 CLINICAL DATA:  Shortness of breath, productive cough EXAM: CHEST - 2 VIEW COMPARISON:  Chest radiograph 12/18/2020 FINDINGS: A cardiac loop recorder is again seen. The cardiomediastinal silhouette is stable. There are dense mitral annular calcifications. There is slight asymmetric elevation of the right hemidiaphragm, unchanged. There is no focal consolidation or pulmonary edema. There is no pleural effusion or pneumothorax There is no acute osseous abnormality. Right axillary surgical clips are again noted. IMPRESSION: 1. No radiographic evidence of acute cardiopulmonary process. 2. Dense mitral annular calcifications. Electronically Signed   By: Valetta Mole M.D.   On: 02/07/2021 13:46        Scheduled Meds:  amLODipine  2.5 mg Oral Daily   budesonide (PULMICORT) nebulizer solution  0.25 mg Nebulization BID   cloNIDine  0.2 mg Oral BID   furosemide  40  mg Oral Daily   hydrALAZINE  50 mg Oral TID   [START ON 02/09/2021] insulin aspart  0-20 Units Subcutaneous TID WC   insulin aspart  0-5 Units Subcutaneous QHS   ipratropium-albuterol  3 mL Nebulization Q6H   isosorbide mononitrate  30 mg Oral Daily   pantoprazole (PROTONIX) IV  40 mg Intravenous BID   sodium chloride flush  3 mL Intravenous Q12H   Continuous Infusions:  sodium chloride       LOS: 0 days    Time spent: 63mins    Kathie Dike, MD [facility]   If 7PM-7AM, please contact night-coverage [email or web address]  02/08/2021, 8:33 PM

## 2021-02-09 ENCOUNTER — Other Ambulatory Visit: Payer: Self-pay

## 2021-02-09 LAB — BASIC METABOLIC PANEL
Anion gap: 7 (ref 5–15)
BUN: 69 mg/dL — ABNORMAL HIGH (ref 8–23)
CO2: 30 mmol/L (ref 22–32)
Calcium: 10.4 mg/dL — ABNORMAL HIGH (ref 8.9–10.3)
Chloride: 102 mmol/L (ref 98–111)
Creatinine, Ser: 2.44 mg/dL — ABNORMAL HIGH (ref 0.44–1.00)
GFR, Estimated: 18 mL/min — ABNORMAL LOW (ref >60)
Glucose, Bld: 206 mg/dL — ABNORMAL HIGH (ref 70–99)
Potassium: 4.1 mmol/L (ref 3.5–5.1)
Sodium: 139 mmol/L (ref 135–145)

## 2021-02-09 LAB — GLUCOSE, CAPILLARY
Glucose-Capillary: 196 mg/dL — ABNORMAL HIGH (ref 70–99)
Glucose-Capillary: 252 mg/dL — ABNORMAL HIGH (ref 70–99)
Glucose-Capillary: 239 mg/dL — ABNORMAL HIGH (ref 70–99)
Glucose-Capillary: 185 mg/dL — ABNORMAL HIGH (ref 70–99)

## 2021-02-09 LAB — CBC
HCT: 26.5 % — ABNORMAL LOW (ref 36.0–46.0)
Hemoglobin: 8.4 g/dL — ABNORMAL LOW (ref 12.0–15.0)
MCH: 32.1 pg (ref 26.0–34.0)
MCHC: 31.7 g/dL (ref 30.0–36.0)
MCV: 101.1 fL — ABNORMAL HIGH (ref 80.0–100.0)
Platelets: 237 10*3/uL (ref 150–400)
RBC: 2.62 MIL/uL — ABNORMAL LOW (ref 3.87–5.11)
RDW: 15 % (ref 11.5–15.5)
WBC: 7.1 10*3/uL (ref 4.0–10.5)
nRBC: 0 % (ref 0.0–0.2)

## 2021-02-09 MED ORDER — ALLOPURINOL 100 MG PO TABS
100.0000 mg | ORAL_TABLET | Freq: Every day | ORAL | Status: DC
  Administered 2021-02-09 – 2021-02-13 (×5): 100 mg via ORAL
  Filled 2021-02-09 (×5): qty 1

## 2021-02-09 MED ORDER — IPRATROPIUM-ALBUTEROL 0.5-2.5 (3) MG/3ML IN SOLN
3.0000 mL | Freq: Three times a day (TID) | RESPIRATORY_TRACT | Status: DC
Start: 2021-02-09 — End: 2021-02-10
  Administered 2021-02-09 – 2021-02-10 (×5): 3 mL via RESPIRATORY_TRACT
  Filled 2021-02-09 (×5): qty 3

## 2021-02-09 MED ORDER — POLYETHYLENE GLYCOL 3350 17 G PO PACK
17.0000 g | PACK | Freq: Every day | ORAL | Status: DC
Start: 2021-02-09 — End: 2021-02-13
  Administered 2021-02-09 – 2021-02-13 (×4): 17 g via ORAL
  Filled 2021-02-09 (×4): qty 1

## 2021-02-09 MED ORDER — GABAPENTIN 100 MG PO CAPS
200.0000 mg | ORAL_CAPSULE | ORAL | Status: DC
Start: 2021-02-09 — End: 2021-02-09

## 2021-02-09 MED ORDER — GABAPENTIN 100 MG PO CAPS
100.0000 mg | ORAL_CAPSULE | Freq: Three times a day (TID) | ORAL | Status: DC
  Administered 2021-02-09 – 2021-02-12 (×8): 100 mg via ORAL
  Filled 2021-02-09 (×8): qty 1

## 2021-02-09 MED ORDER — METHYLPREDNISOLONE SODIUM SUCC 40 MG IJ SOLR
40.0000 mg | Freq: Every day | INTRAMUSCULAR | Status: DC
  Administered 2021-02-09 – 2021-02-12 (×4): 40 mg via INTRAVENOUS
  Filled 2021-02-09 (×4): qty 1

## 2021-02-09 MED ORDER — INSULIN GLARGINE-YFGN 100 UNIT/ML ~~LOC~~ SOLN
15.0000 [IU] | Freq: Every day | SUBCUTANEOUS | Status: DC
  Administered 2021-02-09: 15 [IU] via SUBCUTANEOUS
  Filled 2021-02-09 (×2): qty 0.15

## 2021-02-09 NOTE — Progress Notes (Signed)
PROGRESS NOTE    Sabrina [name]  FBP:[id number] DOB: Sep 18, 1926 DOA: 02/07/2021 PCP: [name], MD    Brief Narrative:  87 year old female with a history of atrial fibrillation on anticoagulation, chronic kidney disease stage IV, hypertension, diabetes, diastolic heart failure, presents to the emergency room with shortness of breath, wheezing.  Chest x-ray did not show any signs of pneumonia or CHF.  She was also noted to have a hemoglobin of 6.3 which is significantly lower than her baseline of 9-10.  Her caregivers report of possible dark stools, although stool did test negative for FOBT in ED.  Admitted for further work-up of anemia and possible bronchitis.   Assessment & Plan:   Principal Problem:   Symptomatic anemia Active Problems:   Chronic diastolic congestive heart failure (HCC)   CKD (chronic kidney disease), stage IV (HCC)   Type 2 diabetes mellitus with stage 4 chronic kidney disease (HCC)   Hypertension associated with diabetes (HCC)   Paroxysmal atrial fibrillation (HCC)   Anemia   Anemia -Noted to have a hemoglobin of 6.3 on admission -Baseline hemoglobin appears to run around 9-10 -1 unit of PRBC was ordered, although I cannot see where this was actually administered to the patient She did have a follow-up hemoglobin which was noted to be 9.6 -I question if original hemoglobin of 6.3 may be an error -Hemoglobin from this morning noted to be 8.4, will continue to monitor for now.  Dark stools -Caregiver reported some dark stool 1 to 2 days prior to admission -Stool for FOBT checked in the emergency room was negative -Currently on Protonix -Monitor for any gross signs of bleeding -If she does develop any gross bleeding, could consider discussing with GI  Paroxysmal atrial fibrillation -Heart rate is currently stable -She is chronically anticoagulated with Eliquis which is currently on hold due to concerns of anemia/bleeding  Chronic kidney disease  stage IV -Creatinine is currently at baseline  Acute bronchitis -Chest x-ray without pneumonia or pulmonary edema -She does have significant wheezing which is similar to her prior presentation -Start on bronchodilators and inhaled steroids -Since she continues to wheeze, will start on Solu-Medrol  Chronic diastolic congestive heart failure -Clinically appears to be compensated -Continue on home dose of Lasix  Hypertension -On multiple agents including hydralazine, clonidine, amlodipine -Most recent blood pressures have been on the lower end -We will discontinue clonidine since she does appear to be somnolent at times.  Diabetes mellitus, type II, uncontrolled with hyperglycemia -Continue on sliding scale insulin -We will start on basal insulin -Follow blood sugars   DVT prophylaxis: SCDs Start: 02/07/21 1923  Code Status: DNR Family Communication: Updated patient's niece, [name] over the phone Disposition Plan: Status is: Inpatient  Remains inpatient appropriate because: Continue to monitor hemoglobin and signs of bleeding.  Needs further treatment of acute bronchitis.    Consultants:    Procedures:    Antimicrobials:      Subjective: Feels that breathing is better today.  Still has some wheezing, but not as pronounced.  Has productive cough.  Has not had any bowel movements today.  Objective: Vitals:   02/09/21 0733 02/09/21 1130 02/09/21 1338 02/09/21 1726  BP:  (!) 135/53 (!) 103/40 (!) 118/48  Pulse:   78   Resp:   18   Temp:   (!) 97.4 F (36.3 C)   TempSrc:   Oral   SpO2: 95%  99%   Weight:      Height:  Intake/Output Summary (Last 24 hours) at 02/09/2021 1928 Last data filed at 02/09/2021 1700 Gross per 24 hour  Intake 840 ml  Output 300 ml  Net 540 ml   Filed Weights   02/07/21 1103  Weight: 74.8 kg    Examination:  General exam: Appears calm and comfortable  Respiratory system: Bilateral wheezes. Respiratory effort  normal. Cardiovascular system: S1 & S2 heard, RRR. No JVD, murmurs, rubs, gallops or clicks. No pedal edema. Gastrointestinal system: Abdomen is nondistended, soft and nontender. No organomegaly or masses felt. Normal bowel sounds heard. Central nervous system: Alert and oriented. No focal neurological deficits. Extremities: Symmetric 5 x 5 power. Skin: No rashes, lesions or ulcers Psychiatry: Somnolent, but wakes up to voice and answers questions    Data Reviewed: I have personally reviewed following labs and imaging studies  CBC: Recent Labs  Lab 02/07/21 1611 02/08/21 0426 02/08/21 2045 02/09/21 0653  WBC 10.9* 8.8 8.3 7.1  NEUTROABS 5.8  --   --   --   HGB 6.3* 9.6* 9.5* 8.4*  HCT 20.1* 30.7* 29.8* 26.5*  MCV 101.5* 101.7* 100.7* 101.1*  PLT 294 257 269 195   Basic Metabolic Panel: Recent Labs  Lab 02/07/21 1611 02/08/21 0426 02/09/21 0653  NA 137 138 139  K 4.4 4.1 4.1  CL 99 100 102  CO2 30 27 30   GLUCOSE 241* 226* 206*  BUN 89* 77* 69*  CREATININE 2.59* 2.32* 2.44*  CALCIUM 10.5* 10.7* 10.4*   GFR: Estimated Creatinine Clearance: 12.7 mL/min (A) (by C-G formula based on SCr of 2.44 mg/dL (H)). Liver Function Tests: Recent Labs  Lab 02/07/21 1611  AST 16  ALT 14  ALKPHOS 78  BILITOT 0.6  PROT 6.7  ALBUMIN 3.4*   No results for input(s): LIPASE, AMYLASE in the last 168 hours. No results for input(s): AMMONIA in the last 168 hours. Coagulation Profile: No results for input(s): INR, PROTIME in the last 168 hours. Cardiac Enzymes: No results for input(s): CKTOTAL, CKMB, CKMBINDEX, TROPONINI in the last 168 hours. BNP (last 3 results) No results for input(s): PROBNP in the last 8760 hours. HbA1C: No results for input(s): HGBA1C in the last 72 hours. CBG: Recent Labs  Lab 02/08/21 1849 02/08/21 2046 02/09/21 0757 02/09/21 1158 02/09/21 1553  GLUCAP 153* 87 196* 252* 239*   Lipid Profile: No results for input(s): CHOL, HDL, LDLCALC, TRIG,  CHOLHDL, LDLDIRECT in the last 72 hours. Thyroid Function Tests: No results for input(s): TSH, T4TOTAL, FREET4, T3FREE, THYROIDAB in the last 72 hours. Anemia Panel: No results for input(s): VITAMINB12, FOLATE, FERRITIN, TIBC, IRON, RETICCTPCT in the last 72 hours. Sepsis Labs: No results for input(s): PROCALCITON, LATICACIDVEN in the last 168 hours.  Recent Results (from the past 240 hour(s))  Resp Panel by RT-PCR (Flu A&B, Covid) Nasopharyngeal Swab     Status: None   Collection Time: 02/07/21 12:35 PM   Specimen: Nasopharyngeal Swab; Nasopharyngeal(NP) swabs in vial transport medium  Result Value Ref Range Status   SARS Coronavirus 2 by RT PCR NEGATIVE NEGATIVE Final    Comment: (NOTE) SARS-CoV-2 target nucleic acids are NOT DETECTED.  The SARS-CoV-2 RNA is generally detectable in upper respiratory specimens during the acute phase of infection. The lowest concentration of SARS-CoV-2 viral copies this assay can detect is 138 copies/mL. A negative result does not preclude SARS-Cov-2 infection and should not be used as the sole basis for treatment or other patient management decisions. A negative result may occur with  improper specimen collection/handling,  submission of specimen other than nasopharyngeal swab, presence of viral mutation(s) within the areas targeted by this assay, and inadequate number of viral copies(<138 copies/mL). A negative result must be combined with clinical observations, patient history, and epidemiological information. The expected result is Negative.  Fact Sheet for Patients:  EntrepreneurPulse.com.au  Fact Sheet for Healthcare Providers:  IncredibleEmployment.be  This test is no t yet approved or cleared by the [place] FDA and  has been authorized for detection and/or diagnosis of SARS-CoV-2 by FDA under an Emergency Use Authorization (EUA). This EUA will remain  in effect (meaning this test can be used) for  the duration of the COVID-19 declaration under Section 564(b)(1) of the Act, 21 U.S.C.section 360bbb-3(b)(1), unless the authorization is terminated  or revoked sooner.       Influenza A by PCR NEGATIVE NEGATIVE Final   Influenza B by PCR NEGATIVE NEGATIVE Final    Comment: (NOTE) The Xpert Xpress SARS-CoV-2/FLU/RSV plus assay is intended as an aid in the diagnosis of influenza from Nasopharyngeal swab specimens and should not be used as a sole basis for treatment. Nasal washings and aspirates are unacceptable for Xpert Xpress SARS-CoV-2/FLU/RSV testing.  Fact Sheet for Patients: EntrepreneurPulse.com.au  Fact Sheet for Healthcare Providers: IncredibleEmployment.be  This test is not yet approved or cleared by the [place] FDA and has been authorized for detection and/or diagnosis of SARS-CoV-2 by FDA under an Emergency Use Authorization (EUA). This EUA will remain in effect (meaning this test can be used) for the duration of the COVID-19 declaration under Section 564(b)(1) of the Act, 21 U.S.C. section 360bbb-3(b)(1), unless the authorization is terminated or revoked.  Performed at [facility], [place] [address], [place], [place] [address]          Radiology Studies: No results found.      Scheduled Meds:  amLODipine  2.5 mg Oral Daily   budesonide (PULMICORT) nebulizer solution  0.25 mg Nebulization BID   furosemide  40 mg Oral Daily   hydrALAZINE  50 mg Oral TID   insulin aspart  0-20 Units Subcutaneous TID WC   insulin aspart  0-5 Units Subcutaneous QHS   ipratropium-albuterol  3 mL Nebulization TID   isosorbide mononitrate  30 mg Oral Daily   pantoprazole (PROTONIX) IV  40 mg Intravenous BID   sodium chloride flush  3 mL Intravenous Q12H   Continuous Infusions:  sodium chloride       LOS: 1 day    Time spent: 10mins    Kathie Dike, MD [facility]   If 7PM-7AM, please contact  night-coverage [email or web address]  02/09/2021, 7:28 PM

## 2021-02-09 NOTE — Plan of Care (Signed)
Initiated [facility] General Care Plan  

## 2021-02-10 ENCOUNTER — Inpatient Hospital Stay (HOSPITAL_COMMUNITY): Payer: Medicare Other

## 2021-02-10 LAB — BASIC METABOLIC PANEL
Anion gap: 10 (ref 5–15)
BUN: 64 mg/dL — ABNORMAL HIGH (ref 8–23)
CO2: 29 mmol/L (ref 22–32)
Calcium: 10.6 mg/dL — ABNORMAL HIGH (ref 8.9–10.3)
Chloride: 98 mmol/L (ref 98–111)
Creatinine, Ser: 2.38 mg/dL — ABNORMAL HIGH (ref 0.44–1.00)
GFR, Estimated: 19 mL/min — ABNORMAL LOW (ref >60)
Glucose, Bld: 300 mg/dL — ABNORMAL HIGH (ref 70–99)
Potassium: 4.5 mmol/L (ref 3.5–5.1)
Sodium: 137 mmol/L (ref 135–145)

## 2021-02-10 LAB — GLUCOSE, CAPILLARY
Glucose-Capillary: 283 mg/dL — ABNORMAL HIGH (ref 70–99)
Glucose-Capillary: 341 mg/dL — ABNORMAL HIGH (ref 70–99)
Glucose-Capillary: 313 mg/dL — ABNORMAL HIGH (ref 70–99)
Glucose-Capillary: 193 mg/dL — ABNORMAL HIGH (ref 70–99)

## 2021-02-10 LAB — CBC
HCT: 28.3 % — ABNORMAL LOW (ref 36.0–46.0)
Hemoglobin: 8.8 g/dL — ABNORMAL LOW (ref 12.0–15.0)
MCH: 31.7 pg (ref 26.0–34.0)
MCHC: 31.1 g/dL (ref 30.0–36.0)
MCV: 101.8 fL — ABNORMAL HIGH (ref 80.0–100.0)
Platelets: 251 10*3/uL (ref 150–400)
RBC: 2.78 MIL/uL — ABNORMAL LOW (ref 3.87–5.11)
RDW: 14.6 % (ref 11.5–15.5)
WBC: 6.4 10*3/uL (ref 4.0–10.5)
nRBC: 0 % (ref 0.0–0.2)

## 2021-02-10 MED ORDER — DILTIAZEM HCL-DEXTROSE 125-5 MG/125ML-% IV SOLN (PREMIX)
5.0000 mg/h | INTRAVENOUS | Status: DC
  Administered 2021-02-10 – 2021-02-11 (×2): 5 mg/h via INTRAVENOUS
  Filled 2021-02-10 (×3): qty 125

## 2021-02-10 MED ORDER — APIXABAN 2.5 MG PO TABS
2.5000 mg | ORAL_TABLET | Freq: Two times a day (BID) | ORAL | Status: DC
  Administered 2021-02-10 – 2021-02-11 (×3): 2.5 mg via ORAL
  Filled 2021-02-10 (×3): qty 1

## 2021-02-10 MED ORDER — SALINE SPRAY 0.65 % NA SOLN
1.0000 | NASAL | Status: DC | PRN
  Filled 2021-02-10: qty 44

## 2021-02-10 MED ORDER — DILTIAZEM HCL 25 MG/5ML IV SOLN
15.0000 mg | Freq: Once | INTRAVENOUS | Status: AC
  Administered 2021-02-10: 15 mg via INTRAVENOUS
  Filled 2021-02-10: qty 5

## 2021-02-10 MED ORDER — HYDRALAZINE HCL 20 MG/ML IJ SOLN
10.0000 mg | INTRAMUSCULAR | Status: DC | PRN
  Administered 2021-02-10: 10 mg via INTRAVENOUS
  Filled 2021-02-10: qty 1

## 2021-02-10 MED ORDER — LEVALBUTEROL HCL 0.63 MG/3ML IN NEBU
0.6300 mg | INHALATION_SOLUTION | Freq: Three times a day (TID) | RESPIRATORY_TRACT | Status: DC
  Administered 2021-02-10 – 2021-02-13 (×8): 0.63 mg via RESPIRATORY_TRACT
  Filled 2021-02-10 (×9): qty 3

## 2021-02-10 MED ORDER — DILTIAZEM HCL 30 MG PO TABS
60.0000 mg | ORAL_TABLET | Freq: Two times a day (BID) | ORAL | Status: DC
  Administered 2021-02-10: 60 mg via ORAL
  Filled 2021-02-10: qty 2

## 2021-02-10 MED ORDER — CLONIDINE HCL 0.1 MG PO TABS: 0.1000 mg | ORAL_TABLET | Freq: Two times a day (BID) | ORAL | Status: DC

## 2021-02-10 MED ORDER — IPRATROPIUM BROMIDE 0.02 % IN SOLN
0.5000 mg | Freq: Three times a day (TID) | RESPIRATORY_TRACT | Status: DC
  Administered 2021-02-10 – 2021-02-13 (×8): 0.5 mg via RESPIRATORY_TRACT
  Filled 2021-02-10 (×9): qty 2.5

## 2021-02-10 MED ORDER — INSULIN ASPART 100 UNIT/ML IJ SOLN
5.0000 [IU] | Freq: Three times a day (TID) | INTRAMUSCULAR | Status: DC
  Administered 2021-02-10 – 2021-02-13 (×7): 5 [IU] via SUBCUTANEOUS

## 2021-02-10 MED ORDER — GUAIFENESIN ER 600 MG PO TB12
600.0000 mg | ORAL_TABLET | Freq: Two times a day (BID) | ORAL | Status: DC
  Administered 2021-02-10 – 2021-02-13 (×7): 600 mg via ORAL
  Filled 2021-02-10 (×7): qty 1

## 2021-02-10 MED ORDER — INSULIN GLARGINE-YFGN 100 UNIT/ML ~~LOC~~ SOLN
30.0000 [IU] | Freq: Every day | SUBCUTANEOUS | Status: DC
Start: 2021-02-10 — End: 2021-02-13
  Administered 2021-02-10 – 2021-02-12 (×3): 30 [IU] via SUBCUTANEOUS
  Filled 2021-02-10 (×5): qty 0.3

## 2021-02-10 NOTE — Progress Notes (Signed)
PROGRESS NOTE    Sabrina [name]  WUJ:[id number] DOB: 12-22-1926 DOA: 02/07/2021 PCP: [name], MD    Brief Narrative:  87 year old female with a history of atrial fibrillation on anticoagulation, chronic kidney disease stage IV, hypertension, diabetes, diastolic heart failure, presents to the emergency room with shortness of breath, wheezing.  Chest x-ray did not show any signs of pneumonia or CHF.  She was also noted to have a hemoglobin of 6.3 which is significantly lower than her baseline of 9-10.  Her caregivers report of possible dark stools, although stool did test negative for FOBT in ED.  Admitted for further work-up of anemia and possible bronchitis.   Assessment & Plan:   Principal Problem:   Symptomatic anemia Active Problems:   Chronic diastolic congestive heart failure (HCC)   CKD (chronic kidney disease), stage IV (HCC)   Type 2 diabetes mellitus with stage 4 chronic kidney disease (HCC)   Hypertension associated with diabetes (HCC)   Paroxysmal atrial fibrillation (HCC)   Anemia   Hypertensive urgency -On multiple agents including hydralazine, clonidine, amlodipine -Clonidine was discontinued on 12/9 due to lethargy and borderline low blood pressures -Increasing blood pressure may be a component of clonidine rebound -With her current vomiting, would not be able to tolerate restarting p.o. clonidine or any other p.o. meds -We will start on diltiazem infusion  Nausea and vomiting -Abdomen appears to be distended -She does have a significant ventral hernia, prior imaging indicates that this hernia does contain bowel -Currently, abdomen is distended, but nontender, bowel sounds are sluggish -NG tube placed for decompression -Checking abdominal x-ray -If inconclusive, may need further imaging with CT -Keep n.p.o. for now  Acute bronchitis -Chest x-ray without pneumonia or pulmonary edema -She continues to have significant wheezing which is similar to her  prior presentation -She is continued on bronchodilators, inhaled steroids -Since she continues to significantly wheeze, we will continue with Solu-Medrol for now  Anemia -Noted to have a hemoglobin of 6.3 on admission -Baseline hemoglobin appears to run around 9-10 -1 unit of PRBC was ordered, although I cannot see where this was actually administered to the patient She did have a follow-up hemoglobin which was noted to be 9.6 -I question if original hemoglobin of 6.3 may be an error -Hemoglobin from this morning noted to be 8.8, will continue to monitor for now.  Dark stools -Caregiver reported some dark stool 1 to 2 days prior to admission -Stool for FOBT checked in the emergency room was negative -Currently on Protonix -Monitor for any gross signs of bleeding -If she does develop any gross bleeding, could consider discussing with GI  Paroxysmal atrial fibrillation -Heart rate persistently tachycardic -Checking EKG -Started on diltiazem infusion -Since her hemoglobin has been stable and she has not had any dark stools, Eliquis restarted  Chronic kidney disease stage IV -Creatinine is currently at baseline  Chronic diastolic congestive heart failure -Clinically appears to be compensated -Continue on home dose of Lasix   Diabetes mellitus, type II, uncontrolled with hyperglycemia -Continue on sliding scale insulin -Continue on basal insulin -Blood sugars elevated in the setting of steroid use   DVT prophylaxis: apixaban (ELIQUIS) tablet 2.5 mg Start: 02/10/21 1530 SCDs Start: 02/07/21 1923 apixaban (ELIQUIS) tablet 2.5 mg  Code Status: DNR Family Communication: Updated patient's niece, [name] over the phone Disposition Plan: Status is: Inpatient  Remains inpatient appropriate because: Continue to monitor hemoglobin and signs of bleeding.  Needs further treatment of acute bronchitis.    Consultants:  Procedures:    Antimicrobials:      Subjective: Patient  was still complaining of shortness of breath.  She was more awake today.  She is bringing up some phlegm with cough.  Has not had a bowel movement today  Later on in the evening, informed by staff that she was persistently tachycardic and hypertensive with a systolic blood pressure greater than 200.  She had repeated episodes of vomiting and abdomen appeared to be distended.  Objective: Vitals:   02/10/21 1454 02/10/21 1718 02/10/21 1840 02/10/21 1852  BP: (!) 183/66 (!) 230/89 (!) 217/77 (!) 207/70  Pulse: (!) 126 (!) 146 (!) 127 (!) 125  Resp: 19 19  (!) 21  Temp: 98.9 F (37.2 C) 98.8 F (37.1 C)  99.3 F (37.4 C)  TempSrc: Oral Oral  Oral  SpO2: 91% 91%  (!) 87%  Weight:      Height:        Intake/Output Summary (Last 24 hours) at 02/10/2021 1915 Last data filed at 02/10/2021 0645 Gross per 24 hour  Intake 240 ml  Output 900 ml  Net -660 ml   Filed Weights   02/07/21 1103  Weight: 74.8 kg    Examination:  General exam: Appears calm and comfortable  Respiratory system: Bilateral wheezes. Respiratory effort normal. Cardiovascular system: S1 & S2 heard, RRR. No JVD, murmurs, rubs, gallops or clicks. No pedal edema. Gastrointestinal system: Abdomen is nondistended, soft and nontender. No organomegaly or masses felt. Normal bowel sounds heard. Central nervous system: Alert and oriented. No focal neurological deficits. Extremities: Symmetric 5 x 5 power. Skin: No rashes, lesions or ulcers Psychiatry: Somnolent, but wakes up to voice and answers questions    Data Reviewed: I have personally reviewed following labs and imaging studies  CBC: Recent Labs  Lab 02/07/21 1611 02/08/21 0426 02/08/21 2045 02/09/21 0653 02/10/21 0619  WBC 10.9* 8.8 8.3 7.1 6.4  NEUTROABS 5.8  --   --   --   --   HGB 6.3* 9.6* 9.5* 8.4* 8.8*  HCT 20.1* 30.7* 29.8* 26.5* 28.3*  MCV 101.5* 101.7* 100.7* 101.1* 101.8*  PLT 294 257 269 237 673   Basic Metabolic Panel: Recent Labs  Lab  02/07/21 1611 02/08/21 0426 02/09/21 0653 02/10/21 0619  NA 137 138 139 137  K 4.4 4.1 4.1 4.5  CL 99 100 102 98  CO2 30 27 30 29   GLUCOSE 241* 226* 206* 300*  BUN 89* 77* 69* 64*  CREATININE 2.59* 2.32* 2.44* 2.38*  CALCIUM 10.5* 10.7* 10.4* 10.6*   GFR: Estimated Creatinine Clearance: 13 mL/min (A) (by C-G formula based on SCr of 2.38 mg/dL (H)). Liver Function Tests: Recent Labs  Lab 02/07/21 1611  AST 16  ALT 14  ALKPHOS 78  BILITOT 0.6  PROT 6.7  ALBUMIN 3.4*   No results for input(s): LIPASE, AMYLASE in the last 168 hours. No results for input(s): AMMONIA in the last 168 hours. Coagulation Profile: No results for input(s): INR, PROTIME in the last 168 hours. Cardiac Enzymes: No results for input(s): CKTOTAL, CKMB, CKMBINDEX, TROPONINI in the last 168 hours. BNP (last 3 results) No results for input(s): PROBNP in the last 8760 hours. HbA1C: No results for input(s): HGBA1C in the last 72 hours. CBG: Recent Labs  Lab 02/09/21 1553 02/09/21 2112 02/10/21 0743 02/10/21 1132 02/10/21 1708  GLUCAP 239* 185* 283* 341* 313*   Lipid Profile: No results for input(s): CHOL, HDL, LDLCALC, TRIG, CHOLHDL, LDLDIRECT in the last 72 hours. Thyroid  Function Tests: No results for input(s): TSH, T4TOTAL, FREET4, T3FREE, THYROIDAB in the last 72 hours. Anemia Panel: No results for input(s): VITAMINB12, FOLATE, FERRITIN, TIBC, IRON, RETICCTPCT in the last 72 hours. Sepsis Labs: No results for input(s): PROCALCITON, LATICACIDVEN in the last 168 hours.  Recent Results (from the past 240 hour(s))  Resp Panel by RT-PCR (Flu A&B, Covid) Nasopharyngeal Swab     Status: None   Collection Time: 02/07/21 12:35 PM   Specimen: Nasopharyngeal Swab; Nasopharyngeal(NP) swabs in vial transport medium  Result Value Ref Range Status   SARS Coronavirus 2 by RT PCR NEGATIVE NEGATIVE Final    Comment: (NOTE) SARS-CoV-2 target nucleic acids are NOT DETECTED.  The SARS-CoV-2 RNA is  generally detectable in upper respiratory specimens during the acute phase of infection. The lowest concentration of SARS-CoV-2 viral copies this assay can detect is 138 copies/mL. A negative result does not preclude SARS-Cov-2 infection and should not be used as the sole basis for treatment or other patient management decisions. A negative result may occur with  improper specimen collection/handling, submission of specimen other than nasopharyngeal swab, presence of viral mutation(s) within the areas targeted by this assay, and inadequate number of viral copies(<138 copies/mL). A negative result must be combined with clinical observations, patient history, and epidemiological information. The expected result is Negative.  Fact Sheet for Patients:  EntrepreneurPulse.com.au  Fact Sheet for Healthcare Providers:  IncredibleEmployment.be  This test is no t yet approved or cleared by the [place] FDA and  has been authorized for detection and/or diagnosis of SARS-CoV-2 by FDA under an Emergency Use Authorization (EUA). This EUA will remain  in effect (meaning this test can be used) for the duration of the COVID-19 declaration under Section 564(b)(1) of the Act, 21 U.S.C.section 360bbb-3(b)(1), unless the authorization is terminated  or revoked sooner.       Influenza A by PCR NEGATIVE NEGATIVE Final   Influenza B by PCR NEGATIVE NEGATIVE Final    Comment: (NOTE) The Xpert Xpress SARS-CoV-2/FLU/RSV plus assay is intended as an aid in the diagnosis of influenza from Nasopharyngeal swab specimens and should not be used as a sole basis for treatment. Nasal washings and aspirates are unacceptable for Xpert Xpress SARS-CoV-2/FLU/RSV testing.  Fact Sheet for Patients: EntrepreneurPulse.com.au  Fact Sheet for Healthcare Providers: IncredibleEmployment.be  This test is not yet approved or cleared by the [place] FDA and has been authorized for detection and/or diagnosis of SARS-CoV-2 by FDA under an Emergency Use Authorization (EUA). This EUA will remain in effect (meaning this test can be used) for the duration of the COVID-19 declaration under Section 564(b)(1) of the Act, 21 U.S.C. section 360bbb-3(b)(1), unless the authorization is terminated or revoked.  Performed at [facility], [place] [address] [place], [place] [address]          Radiology Studies: DG Abd 1 View  Result Date: 02/10/2021 CLINICAL DATA:  Vomiting.  NG tube placement EXAM: ABDOMEN - 1 VIEW COMPARISON:  06/25/2017 FINDINGS: NG tube tip is in the distal stomach. Gas throughout large and small bowel. No evidence of bowel obstruction, organomegaly or free air. IMPRESSION: NG tube tip in the distal stomach. No evidence of bowel obstruction. Electronically Signed   By: Rolm Baptise M.D.   On: 02/10/2021 18:12   DG CHEST PORT 1 VIEW  Result Date: 02/10/2021 CLINICAL DATA:  Vomiting.  Bradycardia, hypertension EXAM: PORTABLE CHEST 1 VIEW COMPARISON:  02/07/2021 FINDINGS: NG tube is in the stomach. Heart is  normal size. Mitral valve annular calcifications noted. No confluent airspace opacities or effusions. IMPRESSION: No active disease. Electronically Signed   By: Rolm Baptise M.D.   On: 02/10/2021 18:13        Scheduled Meds:  allopurinol  100 mg Oral Daily   apixaban  2.5 mg Oral BID   budesonide (PULMICORT) nebulizer solution  0.25 mg Nebulization BID   cloNIDine  0.1 mg Oral BID   furosemide  40 mg Oral Daily   gabapentin  100 mg Oral TID   guaiFENesin  600 mg Oral BID   hydrALAZINE  50 mg Oral TID   insulin aspart  0-20 Units Subcutaneous TID WC   insulin aspart  0-5 Units Subcutaneous QHS   insulin aspart  5 Units Subcutaneous TID WC   insulin glargine-yfgn  30 Units Subcutaneous QHS   ipratropium  0.5 mg Nebulization TID   isosorbide mononitrate  30 mg Oral Daily   levalbuterol   0.63 mg Nebulization TID   methylPREDNISolone (SOLU-MEDROL) injection  40 mg Intravenous Daily   pantoprazole (PROTONIX) IV  40 mg Intravenous BID   polyethylene glycol  17 g Oral Daily   sodium chloride flush  3 mL Intravenous Q12H   Continuous Infusions:  diltiazem (CARDIZEM) infusion 5 mg/hr (02/10/21 1853)     LOS: 2 days    Time spent: 24mins    Kathie Dike, MD [facility]   If 7PM-7AM, please contact night-coverage [email or web address]  02/10/2021, 7:15 PM

## 2021-02-10 NOTE — Progress Notes (Signed)
   02/10/21 1454  Assess: MEWS Score  Temp 98.9 F (37.2 C)  BP (!) 183/66  Pulse Rate (!) 126  Resp 19  SpO2 91 %  O2 Device Nasal Cannula  O2 Flow Rate (L/min) 3 L/min  Assess: MEWS Score  MEWS Temp 0  MEWS Systolic 0  MEWS Pulse 2  MEWS RR 0  MEWS LOC 0  MEWS Score 2  MEWS Score Color Yellow  Assess: if the MEWS score is Yellow or Red  Were vital signs taken at a resting state? Yes  Focused Assessment No change from prior assessment  Does the patient meet 2 or more of the SIRS criteria? No  MEWS guidelines implemented *See Row Information* Yes  Treat  MEWS Interventions Administered scheduled meds/treatments  Pain Scale 0-10  Pain Score 0  Take Vital Signs  Increase Vital Sign Frequency  Yellow: Q 2hr X 2 then Q 4hr X 2, if remains yellow, continue Q 4hrs  Escalate  MEWS: Escalate Yellow: discuss with charge nurse/RN and consider discussing with provider and RRT  Notify: Charge Nurse/RN  Name of Charge Nurse/RN Notified Chelsea, RN  Date Charge Nurse/RN Notified 02/10/21  Time Charge Nurse/RN Notified 1507  Notify: Provider  Provider Name/Title Memon  Date Provider Notified 02/10/21  Time Provider Notified 1507  Notification Type Page  Notification Reason Other (Comment) (mews change, hr & bp)  Provider response Other (Comment) (waiting on MD to reply)  Document  Patient Outcome Other (Comment)  Progress note created (see row info) Yes  Assess: SIRS CRITERIA  SIRS Temperature  0  SIRS Pulse 1  SIRS Respirations  0  SIRS WBC 0  SIRS Score Sum  1

## 2021-02-10 NOTE — Plan of Care (Signed)

## 2021-02-10 NOTE — Progress Notes (Signed)
   02/10/21 1718  Assess: MEWS Score  Temp 98.8 F (37.1 C)  BP (!) 230/89  Pulse Rate (!) 146  Resp 19  SpO2 91 %  O2 Device Nasal Cannula  O2 Flow Rate (L/min) 3 L/min  Assess: MEWS Score  MEWS Temp 0  MEWS Systolic 2  MEWS Pulse 3  MEWS RR 0  MEWS LOC 0  MEWS Score 5  MEWS Score Color Red  Assess: if the MEWS score is Yellow or Red  Were vital signs taken at a resting state? Yes  Focused Assessment Change from prior assessment (see assessment flowsheet)  Does the patient meet 2 or more of the SIRS criteria? No  MEWS guidelines implemented *See Row Information* Yes  Take Vital Signs  Increase Vital Sign Frequency  Red: Q 1hr X 4 then Q 4hr X 4, if remains red, continue Q 4hrs  Escalate  MEWS: Escalate Red: discuss with charge nurse/RN and provider, consider discussing with RRT  Notify: Charge Nurse/RN  Name of Charge Nurse/RN Notified Chelsea RN  Date Charge Nurse/RN Notified 02/10/21  Time Charge Nurse/RN Notified 1720  Notify: Provider  Provider Name/Title Memon  Date Provider Notified 02/10/21  Time Provider Notified 1722  Notification Type Page  Notification Reason Change in status  Provider response At bedside  Date of Provider Response 02/10/21  Time of Provider Response 1724  Notify: Rapid Response  Name of Rapid Response RN Notified Sarah RN  Date Rapid Response Notified 02/10/21  Time Rapid Response Notified 7340  Document  Patient Outcome Transferred/level of care increased  Progress note created (see row info) Yes  Assess: SIRS CRITERIA  SIRS Temperature  0  SIRS Pulse 1  SIRS Respirations  0  SIRS WBC 0  SIRS Score Sum  1

## 2021-02-10 NOTE — Progress Notes (Signed)
Nurse Tech informed Nurse of patients vital signs. BP 230/89 HR 146 Spo2 91% on 3 Liters [place], RR 19.   MD paged to come to bedside and Rapid Response Nurse notified.   Patient began vomiting with no relief to Zofran. NGT placed to LIS. MD placed orders for scans, bed transfer, and Cardizem drip.   Report being called to 4th floor RN.

## 2021-02-10 NOTE — Significant Event (Incomplete)
Rapid Response Event Note   Reason for Call :    Initial Focused Assessment:       Interventions:    Plan of Care:     Event Summary:   MD Notified:  Call Time: Arrival Time: End Time:  Wray Kearns, RN

## 2021-02-11 ENCOUNTER — Inpatient Hospital Stay (HOSPITAL_COMMUNITY): Payer: Medicare Other

## 2021-02-11 LAB — CBC
HCT: 33.6 % — ABNORMAL LOW (ref 36.0–46.0)
Hemoglobin: 10.9 g/dL — ABNORMAL LOW (ref 12.0–15.0)
MCH: 33.2 pg (ref 26.0–34.0)
MCHC: 32.4 g/dL (ref 30.0–36.0)
MCV: 102.4 fL — ABNORMAL HIGH (ref 80.0–100.0)
Platelets: 278 10*3/uL (ref 150–400)
RBC: 3.28 MIL/uL — ABNORMAL LOW (ref 3.87–5.11)
RDW: 15.2 % (ref 11.5–15.5)
WBC: 14.6 10*3/uL — ABNORMAL HIGH (ref 4.0–10.5)
nRBC: 0 % (ref 0.0–0.2)

## 2021-02-11 LAB — GLUCOSE, CAPILLARY
Glucose-Capillary: 124 mg/dL — ABNORMAL HIGH (ref 70–99)
Glucose-Capillary: 135 mg/dL — ABNORMAL HIGH (ref 70–99)
Glucose-Capillary: 164 mg/dL — ABNORMAL HIGH (ref 70–99)
Glucose-Capillary: 176 mg/dL — ABNORMAL HIGH (ref 70–99)
Glucose-Capillary: 92 mg/dL (ref 70–99)

## 2021-02-11 LAB — BASIC METABOLIC PANEL WITH GFR
Anion gap: 11 (ref 5–15)
BUN: 53 mg/dL — ABNORMAL HIGH (ref 8–23)
CO2: 28 mmol/L (ref 22–32)
Calcium: 10.9 mg/dL — ABNORMAL HIGH (ref 8.9–10.3)
Chloride: 99 mmol/L (ref 98–111)
Creatinine, Ser: 2.09 mg/dL — ABNORMAL HIGH (ref 0.44–1.00)
GFR, Estimated: 22 mL/min — ABNORMAL LOW
Glucose, Bld: 159 mg/dL — ABNORMAL HIGH (ref 70–99)
Potassium: 3.9 mmol/L (ref 3.5–5.1)
Sodium: 138 mmol/L (ref 135–145)

## 2021-02-11 LAB — BPAM RBC
Blood Product Expiration Date: 202301032359
Unit Type and Rh: 9500

## 2021-02-11 LAB — TYPE AND SCREEN
ABO/RH(D): O NEG
Antibody Screen: NEGATIVE
Unit division: 0

## 2021-02-11 MED ORDER — PHENOL 1.4 % MT LIQD
1.0000 | OROMUCOSAL | Status: DC | PRN
  Filled 2021-02-11: qty 177

## 2021-02-11 MED ORDER — CLONIDINE HCL 0.1 MG PO TABS
0.1000 mg | ORAL_TABLET | Freq: Two times a day (BID) | ORAL | Status: DC
  Administered 2021-02-11 – 2021-02-13 (×4): 0.1 mg via ORAL
  Filled 2021-02-11 (×4): qty 1

## 2021-02-11 MED ORDER — DILTIAZEM HCL 60 MG PO TABS
60.0000 mg | ORAL_TABLET | Freq: Three times a day (TID) | ORAL | Status: DC
  Administered 2021-02-11 – 2021-02-13 (×5): 60 mg via ORAL
  Filled 2021-02-11 (×5): qty 1

## 2021-02-11 NOTE — TOC CM/SW Note (Signed)
  Transition of Care [facility]) Screening Note   Patient Details  Name: [facility] Andromeda Poppen Date of Birth: [birth year]   Transition of Care [facility]) CM/SW Contact:    Ross Ludwig, LCSW Phone Number: 02/11/2021, 5:17 PM    Transition of Care Department [facility]) has reviewed patient and no TOC needs have been identified at this time. We will continue to monitor patient advancement through interdisciplinary progression rounds. If new patient transition needs arise, please place a TOC consult.

## 2021-02-11 NOTE — Progress Notes (Signed)
PROGRESS NOTE    Sabrina [name]  NOI:[id number] DOB: 1926/09/03 DOA: 02/07/2021 PCP: [name], MD    Brief Narrative:  87 year old female with a history of atrial fibrillation on anticoagulation, chronic kidney disease stage IV, hypertension, diabetes, diastolic heart failure, presents to the emergency room with shortness of breath, wheezing.  Chest x-ray did not show any signs of pneumonia or CHF.  She was also noted to have a hemoglobin of 6.3 which is significantly lower than her baseline of 9-10.  Her caregivers report of possible dark stools, although stool did test negative for FOBT in ED.  Admitted for further work-up of anemia and possible bronchitis.   Assessment & Plan:   Principal Problem:   Symptomatic anemia Active Problems:   Chronic diastolic congestive heart failure (HCC)   CKD (chronic kidney disease), stage IV (HCC)   Type 2 diabetes mellitus with stage 4 chronic kidney disease (HCC)   Hypertension associated with diabetes (HCC)   Paroxysmal atrial fibrillation (HCC)   Anemia   Hypertensive urgency -On multiple agents including hydralazine, clonidine, amlodipine -Clonidine was discontinued on 12/9 due to lethargy and borderline low blood pressures -Increasing blood pressure may be a component of clonidine rebound -Since her GI symptoms are improving, will restart on oral Cardizem with the intention of weaning off IV Cardizem -We will also start on lower dose clonidine in order to help treat rebound hypertension  Nausea and vomiting -Patient's abdomen appeared to be distended, NG tube was placed for decompression -She did not have significant output from NG -Plain films did not show any obvious bowel obstruction -We will check CT abdomen to rule out any issues with her ventral hernia -If no obstruction on CT and overall symptoms are better, can discontinue NG tube  Acute bronchitis -Chest x-ray without pneumonia or pulmonary edema -She continues to  have significant wheezing which is similar to her prior presentation -She is continued on bronchodilators, inhaled steroids -Since she continues to significantly wheeze, we will continue with Solu-Medrol for now  Anemia -Noted to have a hemoglobin of 6.3 on admission -Baseline hemoglobin appears to run around 9-10 -1 unit of PRBC was ordered, although I cannot see where this was actually administered to the patient She did have a follow-up hemoglobin which was noted to be 9.6 -I question if original hemoglobin of 6.3 may be an error -Hemoglobin from this morning noted to be 10.9, will continue to monitor for now.  Dark stools -Caregiver reported some dark stool 1 to 2 days prior to admission -Stool for FOBT checked in the emergency room was negative -Currently on Protonix -Monitor for any gross signs of bleeding -If she does develop any gross bleeding, could consider discussing with GI  Paroxysmal atrial fibrillation -Heart rate persistently tachycardic -Checking EKG -Started on diltiazem infusion -Since her hemoglobin has been stable and she has not had any dark stools, Eliquis restarted  Chronic kidney disease stage IV -Creatinine is currently at baseline  Chronic diastolic congestive heart failure -Clinically appears to be compensated -Continue on home dose of Lasix   Diabetes mellitus, type II, uncontrolled with hyperglycemia -Continue on sliding scale insulin -Continue on basal insulin -Blood sugars elevated in the setting of steroid use   DVT prophylaxis: SCDs Start: 02/07/21 1923  Code Status: DNR Family Communication: Updated patient's niece, [name] over the phone Disposition Plan: Status is: Inpatient  Remains inpatient appropriate because: Continue to monitor hemoglobin and signs of bleeding.  Needs further treatment of acute bronchitis.  Consultants:    Procedures:    Antimicrobials:      Subjective: Patient remains on cardizem infusion.  She does  not have any further nausea or vomiting.  She has not had much output from her NG tube.  She is not had a bowel movement.  Objective: Vitals:   02/11/21 1848 02/11/21 1955 02/11/21 1956 02/11/21 1958  BP: (!) 151/67  (!) 172/60   Pulse:   (!) 114   Resp:   16   Temp:   99.5 F (37.5 C)   TempSrc:   Oral   SpO2:  97% 97% 97%  Weight:      Height:        Intake/Output Summary (Last 24 hours) at 02/11/2021 2042 Last data filed at 02/11/2021 1900 Gross per 24 hour  Intake 984.88 ml  Output 750 ml  Net 234.88 ml   Filed Weights   02/07/21 1103  Weight: 74.8 kg    Examination:  General exam: Appears calm and comfortable  Respiratory system: Bilateral wheezes. Respiratory effort normal. Cardiovascular system: S1 & S2 heard, RRR. No JVD, murmurs, rubs, gallops or clicks. No pedal edema. Gastrointestinal system: Abdomen is nondistended, soft and nontender. No organomegaly or masses felt. Normal bowel sounds heard. Central nervous system: Alert and oriented. No focal neurological deficits. Extremities: Symmetric 5 x 5 power. Skin: No rashes, lesions or ulcers Psychiatry: Somnolent, but wakes up to voice and answers questions    Data Reviewed: I have personally reviewed following labs and imaging studies  CBC: Recent Labs  Lab 02/07/21 1611 02/08/21 0426 02/08/21 2045 02/09/21 0653 02/10/21 0619 02/11/21 1216  WBC 10.9* 8.8 8.3 7.1 6.4 14.6*  NEUTROABS 5.8  --   --   --   --   --   HGB 6.3* 9.6* 9.5* 8.4* 8.8* 10.9*  HCT 20.1* 30.7* 29.8* 26.5* 28.3* 33.6*  MCV 101.5* 101.7* 100.7* 101.1* 101.8* 102.4*  PLT 294 257 269 237 251 409   Basic Metabolic Panel: Recent Labs  Lab 02/07/21 1611 02/08/21 0426 02/09/21 0653 02/10/21 0619 02/11/21 1216  NA 137 138 139 137 138  K 4.4 4.1 4.1 4.5 3.9  CL 99 100 102 98 99  CO2 30 27 30 29 28   GLUCOSE 241* 226* 206* 300* 159*  BUN 89* 77* 69* 64* 53*  CREATININE 2.59* 2.32* 2.44* 2.38* 2.09*  CALCIUM 10.5* 10.7* 10.4*  10.6* 10.9*   GFR: Estimated Creatinine Clearance: 14.8 mL/min (A) (by C-G formula based on SCr of 2.09 mg/dL (H)). Liver Function Tests: Recent Labs  Lab 02/07/21 1611  AST 16  ALT 14  ALKPHOS 78  BILITOT 0.6  PROT 6.7  ALBUMIN 3.4*   No results for input(s): LIPASE, AMYLASE in the last 168 hours. No results for input(s): AMMONIA in the last 168 hours. Coagulation Profile: No results for input(s): INR, PROTIME in the last 168 hours. Cardiac Enzymes: No results for input(s): CKTOTAL, CKMB, CKMBINDEX, TROPONINI in the last 168 hours. BNP (last 3 results) No results for input(s): PROBNP in the last 8760 hours. HbA1C: No results for input(s): HGBA1C in the last 72 hours. CBG: Recent Labs  Lab 02/10/21 1957 02/11/21 0808 02/11/21 1144 02/11/21 1639 02/11/21 1953  GLUCAP 193* 164* 176* 92 124*   Lipid Profile: No results for input(s): CHOL, HDL, LDLCALC, TRIG, CHOLHDL, LDLDIRECT in the last 72 hours. Thyroid Function Tests: No results for input(s): TSH, T4TOTAL, FREET4, T3FREE, THYROIDAB in the last 72 hours. Anemia Panel: No results for input(s): VITAMINB12,  FOLATE, FERRITIN, TIBC, IRON, RETICCTPCT in the last 72 hours. Sepsis Labs: No results for input(s): PROCALCITON, LATICACIDVEN in the last 168 hours.  Recent Results (from the past 240 hour(s))  Resp Panel by RT-PCR (Flu A&B, Covid) Nasopharyngeal Swab     Status: None   Collection Time: 02/07/21 12:35 PM   Specimen: Nasopharyngeal Swab; Nasopharyngeal(NP) swabs in vial transport medium  Result Value Ref Range Status   SARS Coronavirus 2 by RT PCR NEGATIVE NEGATIVE Final    Comment: (NOTE) SARS-CoV-2 target nucleic acids are NOT DETECTED.  The SARS-CoV-2 RNA is generally detectable in upper respiratory specimens during the acute phase of infection. The lowest concentration of SARS-CoV-2 viral copies this assay can detect is 138 copies/mL. A negative result does not preclude SARS-Cov-2 infection and should not  be used as the sole basis for treatment or other patient management decisions. A negative result may occur with  improper specimen collection/handling, submission of specimen other than nasopharyngeal swab, presence of viral mutation(s) within the areas targeted by this assay, and inadequate number of viral copies(<138 copies/mL). A negative result must be combined with clinical observations, patient history, and epidemiological information. The expected result is Negative.  Fact Sheet for Patients:  EntrepreneurPulse.com.au  Fact Sheet for Healthcare Providers:  IncredibleEmployment.be  This test is no t yet approved or cleared by the [place] FDA and  has been authorized for detection and/or diagnosis of SARS-CoV-2 by FDA under an Emergency Use Authorization (EUA). This EUA will remain  in effect (meaning this test can be used) for the duration of the COVID-19 declaration under Section 564(b)(1) of the Act, 21 U.S.C.section 360bbb-3(b)(1), unless the authorization is terminated  or revoked sooner.       Influenza A by PCR NEGATIVE NEGATIVE Final   Influenza B by PCR NEGATIVE NEGATIVE Final    Comment: (NOTE) The Xpert Xpress SARS-CoV-2/FLU/RSV plus assay is intended as an aid in the diagnosis of influenza from Nasopharyngeal swab specimens and should not be used as a sole basis for treatment. Nasal washings and aspirates are unacceptable for Xpert Xpress SARS-CoV-2/FLU/RSV testing.  Fact Sheet for Patients: EntrepreneurPulse.com.au  Fact Sheet for Healthcare Providers: IncredibleEmployment.be  This test is not yet approved or cleared by the [place] FDA and has been authorized for detection and/or diagnosis of SARS-CoV-2 by FDA under an Emergency Use Authorization (EUA). This EUA will remain in effect (meaning this test can be used) for the duration of the COVID-19 declaration under Section  564(b)(1) of the Act, 21 U.S.C. section 360bbb-3(b)(1), unless the authorization is terminated or revoked.  Performed at [facility], [place] [address], [place], [place] [address]          Radiology Studies: CT ABDOMEN PELVIS WO CONTRAST  Result Date: 02/11/2021 CLINICAL DATA:  Nausea and vomiting, respiratory illness, previous history of right breast cancer EXAM: CT CHEST, ABDOMEN AND PELVIS WITHOUT CONTRAST TECHNIQUE: Multidetector CT imaging of the chest, abdomen and pelvis was performed following the standard protocol without IV contrast. Unenhanced CT was performed per clinician order. Lack of IV contrast limits sensitivity and specificity, especially for evaluation of abdominal/pelvic solid viscera. COMPARISON:  12/18/2020, 02/10/2021 FINDINGS: CT CHEST FINDINGS Cardiovascular: Dense calcification of the mitral annulus is again noted. No pericardial effusion. Normal caliber of the thoracic aorta. Evaluation of the vascular lumen is limited without IV contrast. Stable atherosclerosis of the aortic arch. Mediastinum/Nodes: No enlarged mediastinal, hilar, or axillary lymph nodes. Thyroid gland, trachea, and esophagus demonstrate no significant findings.  Enteric catheter extends into the gastric lumen. Lungs/Pleura: Chronic right upper lobe scarring. No acute airspace disease. Trace bilateral pleural effusions, right greater than left. No pneumothorax. Central airways are patent. Musculoskeletal: No acute or destructive bony lesions. Reconstructed images demonstrate no additional findings. CT ABDOMEN PELVIS FINDINGS Hepatobiliary: Calcified gallstones are identified without acute cholecystitis. Unremarkable unenhanced appearance of the liver. Pancreas: Unremarkable unenhanced appearance. Spleen: Unremarkable unenhanced appearance. Adrenals/Urinary Tract: Stable low-attenuation left adrenal thickening consistent with hyperplasia or adenoma. The right adrenals unremarkable. There is  mild bilateral renal cortical atrophy. Small left renal cortical cysts are noted. No urinary tract calculi or obstructive uropathy within either kidney. Bladder is moderately distended without filling defect. Stomach/Bowel: No bowel obstruction or ileus. Normal appendix right lower quadrant. Distal colonic diverticulosis without diverticulitis. No bowel wall thickening or inflammatory change. Enteric catheter tip within the gastric antrum. Vascular/Lymphatic: Aortic atherosclerosis. No enlarged abdominal or pelvic lymph nodes. Reproductive: Uterus and bilateral adnexa are unremarkable. Other: No free fluid or free gas. Fat containing umbilical hernia. No bowel herniation. Musculoskeletal: No acute or destructive bony lesions. Reconstructed images demonstrate no additional findings. IMPRESSION: 1. Trace bilateral pleural effusions. 2. Cholelithiasis without cholecystitis. 3. Distal colonic diverticulosis without diverticulitis. 4. Fat containing umbilical hernia. 5.  Aortic Atherosclerosis (ICD10-I70.0). Electronically Signed   By: Randa Ngo M.D.   On: 02/11/2021 18:28   DG Abd 1 View  Result Date: 02/11/2021 CLINICAL DATA:  Nasogastric tube placement. EXAM: ABDOMEN - 1 VIEW COMPARISON:  February 10, 2021. FINDINGS: Distal tip of nasogastric tube is seen in expected position of distal stomach. No abnormal bowel dilatation is noted. IMPRESSION: Distal tip of nasogastric tube seen in expected position of distal stomach. Electronically Signed   By: Marijo Conception M.D.   On: 02/11/2021 07:35   DG Abd 1 View  Result Date: 02/10/2021 CLINICAL DATA:  Vomiting.  NG tube placement EXAM: ABDOMEN - 1 VIEW COMPARISON:  06/25/2017 FINDINGS: NG tube tip is in the distal stomach. Gas throughout large and small bowel. No evidence of bowel obstruction, organomegaly or free air. IMPRESSION: NG tube tip in the distal stomach. No evidence of bowel obstruction. Electronically Signed   By: Rolm Baptise M.D.   On:  02/10/2021 18:12   CT CHEST WO CONTRAST  Result Date: 02/11/2021 CLINICAL DATA:  Nausea and vomiting, respiratory illness, previous history of right breast cancer EXAM: CT CHEST, ABDOMEN AND PELVIS WITHOUT CONTRAST TECHNIQUE: Multidetector CT imaging of the chest, abdomen and pelvis was performed following the standard protocol without IV contrast. Unenhanced CT was performed per clinician order. Lack of IV contrast limits sensitivity and specificity, especially for evaluation of abdominal/pelvic solid viscera. COMPARISON:  12/18/2020, 02/10/2021 FINDINGS: CT CHEST FINDINGS Cardiovascular: Dense calcification of the mitral annulus is again noted. No pericardial effusion. Normal caliber of the thoracic aorta. Evaluation of the vascular lumen is limited without IV contrast. Stable atherosclerosis of the aortic arch. Mediastinum/Nodes: No enlarged mediastinal, hilar, or axillary lymph nodes. Thyroid gland, trachea, and esophagus demonstrate no significant findings. Enteric catheter extends into the gastric lumen. Lungs/Pleura: Chronic right upper lobe scarring. No acute airspace disease. Trace bilateral pleural effusions, right greater than left. No pneumothorax. Central airways are patent. Musculoskeletal: No acute or destructive bony lesions. Reconstructed images demonstrate no additional findings. CT ABDOMEN PELVIS FINDINGS Hepatobiliary: Calcified gallstones are identified without acute cholecystitis. Unremarkable unenhanced appearance of the liver. Pancreas: Unremarkable unenhanced appearance. Spleen: Unremarkable unenhanced appearance. Adrenals/Urinary Tract: Stable low-attenuation left adrenal thickening consistent with hyperplasia or adenoma.  The right adrenals unremarkable. There is mild bilateral renal cortical atrophy. Small left renal cortical cysts are noted. No urinary tract calculi or obstructive uropathy within either kidney. Bladder is moderately distended without filling defect. Stomach/Bowel: No  bowel obstruction or ileus. Normal appendix right lower quadrant. Distal colonic diverticulosis without diverticulitis. No bowel wall thickening or inflammatory change. Enteric catheter tip within the gastric antrum. Vascular/Lymphatic: Aortic atherosclerosis. No enlarged abdominal or pelvic lymph nodes. Reproductive: Uterus and bilateral adnexa are unremarkable. Other: No free fluid or free gas. Fat containing umbilical hernia. No bowel herniation. Musculoskeletal: No acute or destructive bony lesions. Reconstructed images demonstrate no additional findings. IMPRESSION: 1. Trace bilateral pleural effusions. 2. Cholelithiasis without cholecystitis. 3. Distal colonic diverticulosis without diverticulitis. 4. Fat containing umbilical hernia. 5.  Aortic Atherosclerosis (ICD10-I70.0). Electronically Signed   By: Randa Ngo M.D.   On: 02/11/2021 18:28   DG CHEST PORT 1 VIEW  Result Date: 02/10/2021 CLINICAL DATA:  Vomiting.  Bradycardia, hypertension EXAM: PORTABLE CHEST 1 VIEW COMPARISON:  02/07/2021 FINDINGS: NG tube is in the stomach. Heart is normal size. Mitral valve annular calcifications noted. No confluent airspace opacities or effusions. IMPRESSION: No active disease. Electronically Signed   By: Rolm Baptise M.D.   On: 02/10/2021 18:13        Scheduled Meds:  allopurinol  100 mg Oral Daily   budesonide (PULMICORT) nebulizer solution  0.25 mg Nebulization BID   cloNIDine  0.1 mg Oral BID   diltiazem  60 mg Oral Q8H   furosemide  40 mg Oral Daily   gabapentin  100 mg Oral TID   guaiFENesin  600 mg Oral BID   hydrALAZINE  50 mg Oral TID   insulin aspart  0-20 Units Subcutaneous TID WC   insulin aspart  0-5 Units Subcutaneous QHS   insulin aspart  5 Units Subcutaneous TID WC   insulin glargine-yfgn  30 Units Subcutaneous QHS   ipratropium  0.5 mg Nebulization TID   isosorbide mononitrate  30 mg Oral Daily   levalbuterol  0.63 mg Nebulization TID   methylPREDNISolone (SOLU-MEDROL)  injection  40 mg Intravenous Daily   pantoprazole (PROTONIX) IV  40 mg Intravenous BID   polyethylene glycol  17 g Oral Daily   sodium chloride flush  3 mL Intravenous Q12H   Continuous Infusions:  diltiazem (CARDIZEM) infusion 10 mg/hr (02/11/21 1901)     LOS: 3 days    Time spent: 20mins    Kathie Dike, MD [facility]   If 7PM-7AM, please contact night-coverage [email or web address]  02/11/2021, 8:42 PM

## 2021-02-12 ENCOUNTER — Ambulatory Visit (INDEPENDENT_AMBULATORY_CARE_PROVIDER_SITE_OTHER): Payer: Medicare Other

## 2021-02-12 DIAGNOSIS — I639 Cerebral infarction, unspecified: Secondary | ICD-10-CM

## 2021-02-12 LAB — BASIC METABOLIC PANEL
Anion gap: 9 (ref 5–15)
BUN: 59 mg/dL — ABNORMAL HIGH (ref 8–23)
CO2: 32 mmol/L (ref 22–32)
Calcium: 10.2 mg/dL (ref 8.9–10.3)
Chloride: 95 mmol/L — ABNORMAL LOW (ref 98–111)
Creatinine, Ser: 2.36 mg/dL — ABNORMAL HIGH (ref 0.44–1.00)
GFR, Estimated: 19 mL/min — ABNORMAL LOW (ref >60)
Glucose, Bld: 126 mg/dL — ABNORMAL HIGH (ref 70–99)
Potassium: 4 mmol/L (ref 3.5–5.1)
Sodium: 136 mmol/L (ref 135–145)

## 2021-02-12 LAB — CBC
HCT: 27.6 % — ABNORMAL LOW (ref 36.0–46.0)
Hemoglobin: 8.9 g/dL — ABNORMAL LOW (ref 12.0–15.0)
MCH: 32.1 pg (ref 26.0–34.0)
MCHC: 32.2 g/dL (ref 30.0–36.0)
MCV: 99.6 fL (ref 80.0–100.0)
Platelets: 280 10*3/uL (ref 150–400)
RBC: 2.77 MIL/uL — ABNORMAL LOW (ref 3.87–5.11)
RDW: 14.5 % (ref 11.5–15.5)
WBC: 11.4 10*3/uL — ABNORMAL HIGH (ref 4.0–10.5)
nRBC: 0 % (ref 0.0–0.2)

## 2021-02-12 LAB — GLUCOSE, CAPILLARY
Glucose-Capillary: 120 mg/dL — ABNORMAL HIGH (ref 70–99)
Glucose-Capillary: 199 mg/dL — ABNORMAL HIGH (ref 70–99)
Glucose-Capillary: 248 mg/dL — ABNORMAL HIGH (ref 70–99)
Glucose-Capillary: 301 mg/dL — ABNORMAL HIGH (ref 70–99)

## 2021-02-12 MED ORDER — PREDNISONE 20 MG PO TABS
40.0000 mg | ORAL_TABLET | Freq: Every day | ORAL | Status: DC
  Filled 2021-02-12: qty 2

## 2021-02-12 MED ORDER — APIXABAN 2.5 MG PO TABS
2.5000 mg | ORAL_TABLET | Freq: Two times a day (BID) | ORAL | Status: DC
  Administered 2021-02-12 – 2021-02-13 (×3): 2.5 mg via ORAL
  Filled 2021-02-12 (×3): qty 1

## 2021-02-12 MED ORDER — BISACODYL 10 MG RE SUPP
10.0000 mg | Freq: Once | RECTAL | Status: AC
  Administered 2021-02-12: 10 mg via RECTAL
  Filled 2021-02-12: qty 1

## 2021-02-12 NOTE — Evaluation (Signed)
Physical Therapy Evaluation Patient Details Name: Sabrina [name] MRN: [id number] DOB: 1926-04-11 Today's Date: 02/12/2021  History of Present Illness  87 yo female admitted with anemia, weakness. Hx of R fib fx 01/30/21-currently in CAM boot-transfers to WC/BSC, DVT, gout, CVA, PE, Afib, CHF  Clinical Impression  On eval, pt required Mod A for mobility. She stood x 3 for about 20 seconds each time. Pt was unable to safely perform any transfers with +1 assist on today. Pt presents with general weakness, decreased activity tolerance, and impaired gait and balance. Caregiver was present during session. Discussed d/c plan-pt will return home with caregiver assistance. Discussed possible need for 24/7 care. Will plan to follow and progress activity as tolerated.        Recommendations for follow up therapy are one component of a multi-disciplinary discharge planning process, led by the attending physician.  Recommendations may be updated based on patient status, additional functional criteria and insurance authorization.  Follow Up Recommendations Home health PT    Assistance Recommended at Discharge Frequent or constant Supervision/Assistance  Functional Status Assessment Patient has had a recent decline in their functional status and demonstrates the ability to make significant improvements in function in a reasonable and predictable amount of time.  Equipment Recommendations   (shower chair)    Recommendations for Other Services       Precautions / Restrictions Precautions Precautions: Fall Restrictions Weight Bearing Restrictions: No      Mobility  Bed Mobility Overal bed mobility: Needs Assistance Bed Mobility: Supine to Sit     Supine to sit: Mod assist;HOB elevated     General bed mobility comments: Assist for trunk and LEs. Utilized bedpad for scooting, positioning. Increased time. Pt relied on bedrail.  (At baseline, pt sleep in arm chair with foot stool)     Transfers Overall transfer level: Needs assistance Equipment used: Rolling walker (2 wheels) Transfers: Sit to/from Stand Sit to Stand: Mod assist;From elevated surface           General transfer comment: x 3 for strengthening, activity tolerance, technique. Cues for safety, technique, posture, hand/feet placement. Assist to power up, stabilize, control descent. Pt able to stand for ~20 seconds before fatigue. Unable to safely attempt transfer on today    Ambulation/Gait               General Gait Details: NT-nonambulatory currently  Stairs            Wheelchair Mobility    Modified Rankin (Stroke Patients Only)       Balance Overall balance assessment: Needs assistance;History of Falls         Standing balance support: Bilateral upper extremity supported Standing balance-Leahy Scale: Poor                               Pertinent Vitals/Pain Pain Assessment: No/denies pain    Home Living Family/patient expects to be discharged to:: Private residence Living Arrangements: Alone Available Help at Discharge: Personal care attendant (12 hours/day) Type of Home: House         Home Layout: One level Home Equipment: Cane - single Barista (2 wheels);Shower seat;Wheelchair - manual      Prior Function Prior Level of Function : Needs assist             Mobility Comments: assist for all mobility tasks since R fibula fx 11/29-transfers only currently ADLs Comments: assist for all ADL  tasks     Hand Dominance        Extremity/Trunk Assessment   Upper Extremity Assessment Upper Extremity Assessment: Generalized weakness    Lower Extremity Assessment Lower Extremity Assessment: Generalized weakness    Cervical / Trunk Assessment Cervical / Trunk Assessment: Kyphotic  Communication      Cognition Arousal/Alertness: Awake/alert Behavior During Therapy: WFL for tasks assessed/performed Overall Cognitive Status:  Within Functional Limits for tasks assessed                                          General Comments      Exercises     Assessment/Plan    PT Assessment Patient needs continued PT services  PT Problem List Decreased strength;Decreased mobility;Decreased activity tolerance;Decreased balance;Decreased knowledge of use of DME       PT Treatment Interventions DME instruction;Gait training;Therapeutic exercise;Balance training;Functional mobility training    PT Goals (Current goals can be found in the Care Plan section)  Acute Rehab PT Goals Patient Stated Goal: home. get better. PT Goal Formulation: With patient Time For Goal Achievement: 02/26/21 Potential to Achieve Goals: Fair    Frequency Min 3X/week   Barriers to discharge        Co-evaluation               AM-PAC PT "6 Clicks" Mobility  Outcome Measure Help needed turning from your back to your side while in a flat bed without using bedrails?: A Lot Help needed moving from lying on your back to sitting on the side of a flat bed without using bedrails?: A Lot Help needed moving to and from a bed to a chair (including a wheelchair)?: A Lot Help needed standing up from a chair using your arms (e.g., wheelchair or bedside chair)?: A Lot Help needed to walk in hospital room?: Total Help needed climbing 3-5 steps with a railing? : Total 6 Click Score: 10    End of Session Equipment Utilized During Treatment: Gait belt Activity Tolerance: Patient limited by fatigue Patient left: in bed;with call bell/phone within reach;with family/visitor present (PCA in room)   PT Visit Diagnosis: Muscle weakness (generalized) (M62.81);Difficulty in walking, not elsewhere classified (R26.2)    Time: 1410-3013 PT Time Calculation (min) (ACUTE ONLY): 38 min   Charges:   PT Evaluation $PT Eval Moderate Complexity: 1 Mod PT Treatments $Therapeutic Activity: 23-37 mins           Doreatha Massed, PT Acute  Rehabilitation  Office: [phone] Pager: [phone]

## 2021-02-12 NOTE — Progress Notes (Signed)
Patient O2 on room air= 97%  No supplemental oxygen needed.  Layla Maw, RN

## 2021-02-12 NOTE — Progress Notes (Signed)
PROGRESS NOTE    Sabrina [name]  YBO:[id number] DOB: 08-10-1926 DOA: 02/07/2021 PCP: [name], MD    Brief Narrative:  87 year old female with a history of atrial fibrillation on anticoagulation, chronic kidney disease stage IV, hypertension, diabetes, diastolic heart failure, presents to the emergency room with shortness of breath, wheezing.  Chest x-ray did not show any signs of pneumonia or CHF.  She was also noted to have a hemoglobin of 6.3 which is significantly lower than her baseline of 9-10.  Her caregivers report of possible dark stools, although stool did test negative for FOBT in ED.  Admitted for further work-up of anemia and possible bronchitis.   Assessment & Plan:   Principal Problem:   Symptomatic anemia Active Problems:   Chronic diastolic congestive heart failure (HCC)   CKD (chronic kidney disease), stage IV (HCC)   Type 2 diabetes mellitus with stage 4 chronic kidney disease (HCC)   Hypertension associated with diabetes (HCC)   Paroxysmal atrial fibrillation (HCC)   Anemia   Hypertensive urgency -On multiple agents including hydralazine, clonidine, amlodipine -Clonidine was discontinued on 12/9 due to lethargy and borderline low blood pressures -She subsequently developed hypertensive urgency with a systolic blood pressure greater than 220. -Increasing blood pressure may be related to a component of rebound hypertension from discontinuation of clonidine -Since she was also significantly tachycardic, she transiently required diltiazem infusion -Overall blood pressure and heart rate have improved -She has been re-started on reduced dose clonidine and does appear to be mildly more somnolent -Would consider tapering this slowly as an outpatient, will but will defer to primary care physician.  Nausea and vomiting -Patient's abdomen appeared to be distended on 12/10, NG tube was placed for decompression -She did not have significant output from NG -Plain  films did not show any obvious bowel obstruction -CT abdomen did not show any signs of bowel obstruction.  Ventral hernia did not contain any bowel, only fat. -Overall nausea and vomiting have resolved.  NG tube removed and she was started on clear liquids which she appears to be tolerating -Transition to full liquids today -Caregivers report that she does occasionally have episodes of nausea and vomiting at home which are usually transient.  Acute bronchitis -Chest x-ray without pneumonia or pulmonary edema -CT chest did not show any acute findings -She was treated with steroids and bronchodilators -Overall wheezing has improved -Transition Solu-Medrol to prednisone -We will try and wean off oxygen today  Anemia -Noted to have a hemoglobin of 6.3 on admission -Baseline hemoglobin appears to run around 9-10 -1 unit of PRBC was ordered, although I cannot see where this was actually administered to the patient She did have a follow-up hemoglobin which was noted to be 9.6 -I question if original hemoglobin of 6.3 may be an error -Hemoglobin from this morning noted to be 8.9 will continue to monitor for now.  Dark stools -Caregiver reported some dark stool 1 to 2 days prior to admission -Stool for FOBT checked in the emergency room was negative and she has not had any further episodes since admission -Currently on Protonix -Monitor for any gross signs of bleeding -If she does develop any gross bleeding, could consider discussing with GI  Paroxysmal atrial fibrillation -Transiently required diltiazem infusion, has since transitioned to oral diltiazem -Since her hemoglobin has been stable and she has not had any dark stools, Eliquis restarted  Chronic kidney disease stage IV -Creatinine is currently at baseline  Chronic diastolic congestive heart failure -Clinically appears  to be compensated -Continue on home dose of Lasix   Diabetes mellitus, type II, uncontrolled with  hyperglycemia -Continue on sliding scale insulin -Continue on basal insulin -Blood sugars elevated in the setting of steroid use  Generalized weakness -Seen by physical therapy with recommendations for home health   DVT prophylaxis: apixaban (ELIQUIS) tablet 2.5 mg Start: 02/12/21 1345 SCDs Start: 02/07/21 1923 apixaban (ELIQUIS) tablet 2.5 mg  Code Status: DNR Family Communication: Updated patient's niece, [name] over the phone Disposition Plan: Status is: Inpatient  Remains inpatient appropriate because: Continue to monitor hemoglobin and signs of bleeding.  Needs further treatment of acute bronchitis.    Consultants:    Procedures:    Antimicrobials:      Subjective: Feels that breathing is improving.  No nausea or vomiting, tolerating clear liquids.  Has not had a bowel movement in several days.  Objective: Vitals:   02/12/21 1021 02/12/21 1332 02/12/21 1550 02/12/21 1630  BP: (!) 144/52 (!) 133/46  (!) 140/49  Pulse: (!) 103 84    Resp: 18 16    Temp: 98.2 F (36.8 C) (!) 97.4 F (36.3 C)    TempSrc: Oral Oral    SpO2: 98% 97% (!) 85%   Weight:      Height:        Intake/Output Summary (Last 24 hours) at 02/12/2021 1758 Last data filed at 02/12/2021 1606 Gross per 24 hour  Intake 860 ml  Output 1400 ml  Net -540 ml   Filed Weights   02/07/21 1103 02/12/21 0615  Weight: 74.8 kg 75.9 kg    Examination:  General exam: Appears calm and comfortable  Respiratory system: clear bilaterally, Respiratory effort normal. Cardiovascular system: S1 & S2 heard, RRR. No JVD, murmurs, rubs, gallops or clicks. No pedal edema. Gastrointestinal system: Abdomen is nondistended, soft and nontender. No organomegaly or masses felt. Normal bowel sounds heard. Central nervous system: Alert and oriented. No focal neurological deficits. Extremities: Symmetric 5 x 5 power. Skin: No rashes, lesions or ulcers Psychiatry: Somnolent, but wakes up to voice and answers  questions    Data Reviewed: I have personally reviewed following labs and imaging studies  CBC: Recent Labs  Lab 02/07/21 1611 02/08/21 0426 02/08/21 2045 02/09/21 0653 02/10/21 0619 02/11/21 1216 02/12/21 0436  WBC 10.9*   < > 8.3 7.1 6.4 14.6* 11.4*  NEUTROABS 5.8  --   --   --   --   --   --   HGB 6.3*   < > 9.5* 8.4* 8.8* 10.9* 8.9*  HCT 20.1*   < > 29.8* 26.5* 28.3* 33.6* 27.6*  MCV 101.5*   < > 100.7* 101.1* 101.8* 102.4* 99.6  PLT 294   < > 269 237 251 278 280   < > = values in this interval not displayed.   Basic Metabolic Panel: Recent Labs  Lab 02/08/21 0426 02/09/21 0653 02/10/21 0619 02/11/21 1216 02/12/21 0436  NA 138 139 137 138 136  K 4.1 4.1 4.5 3.9 4.0  CL 100 102 98 99 95*  CO2 27 30 29 28  32  GLUCOSE 226* 206* 300* 159* 126*  BUN 77* 69* 64* 53* 59*  CREATININE 2.32* 2.44* 2.38* 2.09* 2.36*  CALCIUM 10.7* 10.4* 10.6* 10.9* 10.2   GFR: Estimated Creatinine Clearance: 13.2 mL/min (A) (by C-G formula based on SCr of 2.36 mg/dL (H)). Liver Function Tests: Recent Labs  Lab 02/07/21 1611  AST 16  ALT 14  ALKPHOS 78  BILITOT 0.6  PROT  6.7  ALBUMIN 3.4*   No results for input(s): LIPASE, AMYLASE in the last 168 hours. No results for input(s): AMMONIA in the last 168 hours. Coagulation Profile: No results for input(s): INR, PROTIME in the last 168 hours. Cardiac Enzymes: No results for input(s): CKTOTAL, CKMB, CKMBINDEX, TROPONINI in the last 168 hours. BNP (last 3 results) No results for input(s): PROBNP in the last 8760 hours. HbA1C: No results for input(s): HGBA1C in the last 72 hours. CBG: Recent Labs  Lab 02/11/21 1953 02/11/21 2353 02/12/21 0817 02/12/21 1150 02/12/21 1652  GLUCAP 124* 135* 120* 199* 248*   Lipid Profile: No results for input(s): CHOL, HDL, LDLCALC, TRIG, CHOLHDL, LDLDIRECT in the last 72 hours. Thyroid Function Tests: No results for input(s): TSH, T4TOTAL, FREET4, T3FREE, THYROIDAB in the last 72  hours. Anemia Panel: No results for input(s): VITAMINB12, FOLATE, FERRITIN, TIBC, IRON, RETICCTPCT in the last 72 hours. Sepsis Labs: No results for input(s): PROCALCITON, LATICACIDVEN in the last 168 hours.  Recent Results (from the past 240 hour(s))  Resp Panel by RT-PCR (Flu A&B, Covid) Nasopharyngeal Swab     Status: None   Collection Time: 02/07/21 12:35 PM   Specimen: Nasopharyngeal Swab; Nasopharyngeal(NP) swabs in vial transport medium  Result Value Ref Range Status   SARS Coronavirus 2 by RT PCR NEGATIVE NEGATIVE Final    Comment: (NOTE) SARS-CoV-2 target nucleic acids are NOT DETECTED.  The SARS-CoV-2 RNA is generally detectable in upper respiratory specimens during the acute phase of infection. The lowest concentration of SARS-CoV-2 viral copies this assay can detect is 138 copies/mL. A negative result does not preclude SARS-Cov-2 infection and should not be used as the sole basis for treatment or other patient management decisions. A negative result may occur with  improper specimen collection/handling, submission of specimen other than nasopharyngeal swab, presence of viral mutation(s) within the areas targeted by this assay, and inadequate number of viral copies(<138 copies/mL). A negative result must be combined with clinical observations, patient history, and epidemiological information. The expected result is Negative.  Fact Sheet for Patients:  EntrepreneurPulse.com.au  Fact Sheet for Healthcare Providers:  IncredibleEmployment.be  This test is no t yet approved or cleared by the [place] FDA and  has been authorized for detection and/or diagnosis of SARS-CoV-2 by FDA under an Emergency Use Authorization (EUA). This EUA will remain  in effect (meaning this test can be used) for the duration of the COVID-19 declaration under Section 564(b)(1) of the Act, 21 U.S.C.section 360bbb-3(b)(1), unless the authorization is  terminated  or revoked sooner.       Influenza A by PCR NEGATIVE NEGATIVE Final   Influenza B by PCR NEGATIVE NEGATIVE Final    Comment: (NOTE) The Xpert Xpress SARS-CoV-2/FLU/RSV plus assay is intended as an aid in the diagnosis of influenza from Nasopharyngeal swab specimens and should not be used as a sole basis for treatment. Nasal washings and aspirates are unacceptable for Xpert Xpress SARS-CoV-2/FLU/RSV testing.  Fact Sheet for Patients: EntrepreneurPulse.com.au  Fact Sheet for Healthcare Providers: IncredibleEmployment.be  This test is not yet approved or cleared by the [place] FDA and has been authorized for detection and/or diagnosis of SARS-CoV-2 by FDA under an Emergency Use Authorization (EUA). This EUA will remain in effect (meaning this test can be used) for the duration of the COVID-19 declaration under Section 564(b)(1) of the Act, 21 U.S.C. section 360bbb-3(b)(1), unless the authorization is terminated or revoked.  Performed at [facility], [address] [place], [place]  [address]          Radiology Studies: CT ABDOMEN PELVIS WO CONTRAST  Result Date: 02/11/2021 CLINICAL DATA:  Nausea and vomiting, respiratory illness, previous history of right breast cancer EXAM: CT CHEST, ABDOMEN AND PELVIS WITHOUT CONTRAST TECHNIQUE: Multidetector CT imaging of the chest, abdomen and pelvis was performed following the standard protocol without IV contrast. Unenhanced CT was performed per clinician order. Lack of IV contrast limits sensitivity and specificity, especially for evaluation of abdominal/pelvic solid viscera. COMPARISON:  12/18/2020, 02/10/2021 FINDINGS: CT CHEST FINDINGS Cardiovascular: Dense calcification of the mitral annulus is again noted. No pericardial effusion. Normal caliber of the thoracic aorta. Evaluation of the vascular lumen is limited without IV contrast. Stable atherosclerosis of the  aortic arch. Mediastinum/Nodes: No enlarged mediastinal, hilar, or axillary lymph nodes. Thyroid gland, trachea, and esophagus demonstrate no significant findings. Enteric catheter extends into the gastric lumen. Lungs/Pleura: Chronic right upper lobe scarring. No acute airspace disease. Trace bilateral pleural effusions, right greater than left. No pneumothorax. Central airways are patent. Musculoskeletal: No acute or destructive bony lesions. Reconstructed images demonstrate no additional findings. CT ABDOMEN PELVIS FINDINGS Hepatobiliary: Calcified gallstones are identified without acute cholecystitis. Unremarkable unenhanced appearance of the liver. Pancreas: Unremarkable unenhanced appearance. Spleen: Unremarkable unenhanced appearance. Adrenals/Urinary Tract: Stable low-attenuation left adrenal thickening consistent with hyperplasia or adenoma. The right adrenals unremarkable. There is mild bilateral renal cortical atrophy. Small left renal cortical cysts are noted. No urinary tract calculi or obstructive uropathy within either kidney. Bladder is moderately distended without filling defect. Stomach/Bowel: No bowel obstruction or ileus. Normal appendix right lower quadrant. Distal colonic diverticulosis without diverticulitis. No bowel wall thickening or inflammatory change. Enteric catheter tip within the gastric antrum. Vascular/Lymphatic: Aortic atherosclerosis. No enlarged abdominal or pelvic lymph nodes. Reproductive: Uterus and bilateral adnexa are unremarkable. Other: No free fluid or free gas. Fat containing umbilical hernia. No bowel herniation. Musculoskeletal: No acute or destructive bony lesions. Reconstructed images demonstrate no additional findings. IMPRESSION: 1. Trace bilateral pleural effusions. 2. Cholelithiasis without cholecystitis. 3. Distal colonic diverticulosis without diverticulitis. 4. Fat containing umbilical hernia. 5.  Aortic Atherosclerosis (ICD10-I70.0). Electronically Signed    By: Randa Ngo M.D.   On: 02/11/2021 18:28   DG Abd 1 View  Result Date: 02/11/2021 CLINICAL DATA:  Nasogastric tube placement. EXAM: ABDOMEN - 1 VIEW COMPARISON:  February 10, 2021. FINDINGS: Distal tip of nasogastric tube is seen in expected position of distal stomach. No abnormal bowel dilatation is noted. IMPRESSION: Distal tip of nasogastric tube seen in expected position of distal stomach. Electronically Signed   By: Marijo Conception M.D.   On: 02/11/2021 07:35   DG Abd 1 View  Result Date: 02/10/2021 CLINICAL DATA:  Vomiting.  NG tube placement EXAM: ABDOMEN - 1 VIEW COMPARISON:  06/25/2017 FINDINGS: NG tube tip is in the distal stomach. Gas throughout large and small bowel. No evidence of bowel obstruction, organomegaly or free air. IMPRESSION: NG tube tip in the distal stomach. No evidence of bowel obstruction. Electronically Signed   By: Rolm Baptise M.D.   On: 02/10/2021 18:12   CT CHEST WO CONTRAST  Result Date: 02/11/2021 CLINICAL DATA:  Nausea and vomiting, respiratory illness, previous history of right breast cancer EXAM: CT CHEST, ABDOMEN AND PELVIS WITHOUT CONTRAST TECHNIQUE: Multidetector CT imaging of the chest, abdomen and pelvis was performed following the standard protocol without IV contrast. Unenhanced CT was performed per clinician order. Lack of IV contrast limits sensitivity and specificity, especially for evaluation of abdominal/pelvic  solid viscera. COMPARISON:  12/18/2020, 02/10/2021 FINDINGS: CT CHEST FINDINGS Cardiovascular: Dense calcification of the mitral annulus is again noted. No pericardial effusion. Normal caliber of the thoracic aorta. Evaluation of the vascular lumen is limited without IV contrast. Stable atherosclerosis of the aortic arch. Mediastinum/Nodes: No enlarged mediastinal, hilar, or axillary lymph nodes. Thyroid gland, trachea, and esophagus demonstrate no significant findings. Enteric catheter extends into the gastric lumen. Lungs/Pleura: Chronic  right upper lobe scarring. No acute airspace disease. Trace bilateral pleural effusions, right greater than left. No pneumothorax. Central airways are patent. Musculoskeletal: No acute or destructive bony lesions. Reconstructed images demonstrate no additional findings. CT ABDOMEN PELVIS FINDINGS Hepatobiliary: Calcified gallstones are identified without acute cholecystitis. Unremarkable unenhanced appearance of the liver. Pancreas: Unremarkable unenhanced appearance. Spleen: Unremarkable unenhanced appearance. Adrenals/Urinary Tract: Stable low-attenuation left adrenal thickening consistent with hyperplasia or adenoma. The right adrenals unremarkable. There is mild bilateral renal cortical atrophy. Small left renal cortical cysts are noted. No urinary tract calculi or obstructive uropathy within either kidney. Bladder is moderately distended without filling defect. Stomach/Bowel: No bowel obstruction or ileus. Normal appendix right lower quadrant. Distal colonic diverticulosis without diverticulitis. No bowel wall thickening or inflammatory change. Enteric catheter tip within the gastric antrum. Vascular/Lymphatic: Aortic atherosclerosis. No enlarged abdominal or pelvic lymph nodes. Reproductive: Uterus and bilateral adnexa are unremarkable. Other: No free fluid or free gas. Fat containing umbilical hernia. No bowel herniation. Musculoskeletal: No acute or destructive bony lesions. Reconstructed images demonstrate no additional findings. IMPRESSION: 1. Trace bilateral pleural effusions. 2. Cholelithiasis without cholecystitis. 3. Distal colonic diverticulosis without diverticulitis. 4. Fat containing umbilical hernia. 5.  Aortic Atherosclerosis (ICD10-I70.0). Electronically Signed   By: Randa Ngo M.D.   On: 02/11/2021 18:28   DG CHEST PORT 1 VIEW  Result Date: 02/10/2021 CLINICAL DATA:  Vomiting.  Bradycardia, hypertension EXAM: PORTABLE CHEST 1 VIEW COMPARISON:  02/07/2021 FINDINGS: NG tube is in the  stomach. Heart is normal size. Mitral valve annular calcifications noted. No confluent airspace opacities or effusions. IMPRESSION: No active disease. Electronically Signed   By: Rolm Baptise M.D.   On: 02/10/2021 18:13        Scheduled Meds:  allopurinol  100 mg Oral Daily   apixaban  2.5 mg Oral BID   budesonide (PULMICORT) nebulizer solution  0.25 mg Nebulization BID   cloNIDine  0.1 mg Oral BID   diltiazem  60 mg Oral Q8H   furosemide  40 mg Oral Daily   guaiFENesin  600 mg Oral BID   hydrALAZINE  50 mg Oral TID   insulin aspart  0-20 Units Subcutaneous TID WC   insulin aspart  0-5 Units Subcutaneous QHS   insulin aspart  5 Units Subcutaneous TID WC   insulin glargine-yfgn  30 Units Subcutaneous QHS   ipratropium  0.5 mg Nebulization TID   isosorbide mononitrate  30 mg Oral Daily   levalbuterol  0.63 mg Nebulization TID   pantoprazole (PROTONIX) IV  40 mg Intravenous BID   polyethylene glycol  17 g Oral Daily   [START ON 02/13/2021] predniSONE  40 mg Oral Q breakfast   sodium chloride flush  3 mL Intravenous Q12H   Continuous Infusions:     LOS: 4 days    Time spent: 23mins    Kathie Dike, MD [facility]   If 7PM-7AM, please contact night-coverage [email or web address]  02/12/2021, 5:58 PM

## 2021-02-12 NOTE — Progress Notes (Signed)
   02/12/21 1550  Oxygen Therapy  SpO2 (!) 85 %  O2 Device Room Air   When Respiratory therapy was working with patient, O2 saturation was 85% on room air. This is a desaturation from earlier today.   Nasal Cannula applied by RT and patient O2 sat is currently 98% on 2 L.   Layla Maw, RN

## 2021-02-12 NOTE — Progress Notes (Signed)
   02/12/21 1936  Urine Characteristics  Urinary Interventions Bladder scan  Bladder Scan Volume (mL) 696 mL  Intermittent/Straight Cath (mL) 700 mL   MD made aware. Night RN performed intermittent catheterization and will monitor patient output over night.  Layla Maw, RN

## 2021-02-12 NOTE — TOC Progression Note (Signed)
Transition of Care [facility]) - Progression Note    Patient Details  Name: Sabrina [name] MRN: [id number] Date of Birth: Jul 26, 1926  Transition of Care [facility]) CM/SW Contact  Leeroy Cha, RN Phone Number: 02/12/2021, 8:13 AM  Clinical Narrative:     Transition of Care [facility]) Screening Note   Patient Details  Name: Sabrina [name] Date of Birth: 1926/10/02   Transition of Care [facility] CM/SW Contact:    Leeroy Cha, RN Phone Number: 02/12/2021, 8:13 AM    Transition of Care Department [facility]) has reviewed patient and no TOC needs have been identified at this time. We will continue to monitor patient advancement through interdisciplinary progression rounds. If new patient transition needs arise, please place a TOC consult.  Plan is to return to home with spouse.  May need home o2 will follow     Expected Discharge Plan and Services                                                 Social Determinants of Health (SDOH) Interventions    Readmission Risk Interventions Readmission Risk Prevention Plan 12/22/2020 12/20/2020  Transportation Screening Complete Complete  PCP or Specialist Appt within 5-7 Days Complete Complete  Home Care Screening Complete Complete  Medication Review (RN CM) Complete Complete  Some recent data might be hidden

## 2021-02-12 NOTE — Care Management Important Message (Signed)
Important Message  Patient Details IM Letter placed in Patients room. Name: Sabrina [name] MRN: [id number] Date of Birth: Sep 12, 1926   Medicare Important Message Given:  Yes     Kerin Salen 02/12/2021, 1:00 PM

## 2021-02-13 DIAGNOSIS — D649 Anemia, unspecified: Principal | ICD-10-CM

## 2021-02-13 DIAGNOSIS — I480 Paroxysmal atrial fibrillation: Secondary | ICD-10-CM

## 2021-02-13 DIAGNOSIS — E1159 Type 2 diabetes mellitus with other circulatory complications: Secondary | ICD-10-CM

## 2021-02-13 DIAGNOSIS — N184 Chronic kidney disease, stage 4 (severe): Secondary | ICD-10-CM

## 2021-02-13 DIAGNOSIS — E1122 Type 2 diabetes mellitus with diabetic chronic kidney disease: Secondary | ICD-10-CM

## 2021-02-13 DIAGNOSIS — I152 Hypertension secondary to endocrine disorders: Secondary | ICD-10-CM

## 2021-02-13 LAB — CBC
HCT: 27.7 % — ABNORMAL LOW (ref 36.0–46.0)
Hemoglobin: 8.9 g/dL — ABNORMAL LOW (ref 12.0–15.0)
MCH: 32.1 pg (ref 26.0–34.0)
MCHC: 32.1 g/dL (ref 30.0–36.0)
MCV: 100 fL (ref 80.0–100.0)
Platelets: 301 10*3/uL (ref 150–400)
RBC: 2.77 MIL/uL — ABNORMAL LOW (ref 3.87–5.11)
RDW: 14.6 % (ref 11.5–15.5)
WBC: 13.9 10*3/uL — ABNORMAL HIGH (ref 4.0–10.5)
nRBC: 0 % (ref 0.0–0.2)

## 2021-02-13 LAB — BASIC METABOLIC PANEL
Anion gap: 11 (ref 5–15)
BUN: 77 mg/dL — ABNORMAL HIGH (ref 8–23)
CO2: 29 mmol/L (ref 22–32)
Calcium: 10 mg/dL (ref 8.9–10.3)
Chloride: 94 mmol/L — ABNORMAL LOW (ref 98–111)
Creatinine, Ser: 2.55 mg/dL — ABNORMAL HIGH (ref 0.44–1.00)
GFR, Estimated: 17 mL/min — ABNORMAL LOW (ref >60)
Glucose, Bld: 169 mg/dL — ABNORMAL HIGH (ref 70–99)
Potassium: 4.5 mmol/L (ref 3.5–5.1)
Sodium: 134 mmol/L — ABNORMAL LOW (ref 135–145)

## 2021-02-13 LAB — CUP PACEART REMOTE DEVICE CHECK
Date Time Interrogation Session: 20221128235249
Implantable Pulse Generator Implant Date: 20190614

## 2021-02-13 LAB — GLUCOSE, CAPILLARY: Glucose-Capillary: 156 mg/dL — ABNORMAL HIGH (ref 70–99)

## 2021-02-13 MED ORDER — MOMETASONE FURO-FORMOTEROL FUM 200-5 MCG/ACT IN AERO: 2.0000 | INHALATION_SPRAY | Freq: Two times a day (BID) | RESPIRATORY_TRACT | 0 refills | Status: DC

## 2021-02-13 MED ORDER — IPRATROPIUM BROMIDE 0.02 % IN SOLN: 0.5000 mg | Freq: Two times a day (BID) | RESPIRATORY_TRACT | Status: DC

## 2021-02-13 MED ORDER — LEVALBUTEROL HCL 0.63 MG/3ML IN NEBU
0.6300 mg | INHALATION_SOLUTION | Freq: Two times a day (BID) | RESPIRATORY_TRACT | Status: DC
Start: 2021-02-13 — End: 2021-02-13

## 2021-02-13 MED ORDER — IPRATROPIUM-ALBUTEROL 0.5-2.5 (3) MG/3ML IN SOLN: 3.0000 mL | Freq: Four times a day (QID) | RESPIRATORY_TRACT | Status: DC | PRN

## 2021-02-13 MED ORDER — DILTIAZEM HCL 60 MG PO TABS: 60.0000 mg | ORAL_TABLET | Freq: Three times a day (TID) | ORAL | 0 refills | Status: DC

## 2021-02-13 MED ORDER — IPRATROPIUM-ALBUTEROL 0.5-2.5 (3) MG/3ML IN SOLN: 3.0000 mL | Freq: Four times a day (QID) | RESPIRATORY_TRACT | 0 refills | Status: DC | PRN

## 2021-02-13 MED ORDER — MOMETASONE FURO-FORMOTEROL FUM 200-5 MCG/ACT IN AERO
2.0000 | INHALATION_SPRAY | Freq: Two times a day (BID) | RESPIRATORY_TRACT | Status: DC
  Filled 2021-02-13: qty 8.8

## 2021-02-13 NOTE — TOC Transition Note (Signed)
Transition of Care [facility]) - CM/SW Discharge Note   Patient Details  Name: Sabrina [name] MRN: [id number] Date of Birth: 1926/09/30  Transition of Care [facility]) CM/SW Contact:  Leeroy Cha, RN Phone Number: 02/13/2021, 12:27 PM   Clinical Narrative:    Patient dcd to return home with no toc needs.   Final next level of care: Home/Self Care Barriers to Discharge: No Barriers Identified   Patient Goals and CMS Choice Patient states their goals for this hospitalization and ongoing recovery are:: to go home      Discharge Placement                       Discharge Plan and Services   Discharge Planning Services: CM Consult                                 Social Determinants of Health (SDOH) Interventions     Readmission Risk Interventions Readmission Risk Prevention Plan 12/22/2020 12/20/2020  Transportation Screening Complete Complete  PCP or Specialist Appt within 5-7 Days Complete Complete  Home Care Screening Complete Complete  Medication Review (RN CM) Complete Complete  Some recent data might be hidden

## 2021-02-13 NOTE — Discharge Summary (Addendum)
Physician Discharge Summary  Sabrina [name] NWG:[id number] DOB: May 06, 1926 DOA: 02/07/2021  PCP: [name], MD  Admit date: 02/07/2021 Discharge date: 02/13/2021  Admitted From: home  Disposition:  Home   Recommendations for Outpatient Follow-up and new medication changes:  Follow up with Dr. [name] in 7 to 10 days.  Continue bronchodilator therapy and inhaled steroids.  Resume home 02 keep oxygen saturation 92% or greater.  Follow up renal function as outpatient. Patient has multiple medical problems, advance age and high risk for decompensation. I recommendation to add palliative care to her care team.  Discontinue gabapentin due to risk of encephalopathy.    I spoke over the phone with the patient's nice about patient's  condition, plan of care, prognosis and all questions were addressed.   Home Health: yes  Equipment/Devices: home 02, shower chair    Discharge Condition: stable  CODE STATUS: full  Diet recommendation:  heart healthy   Brief/Interim Summary: Sabrina [name] was admitted to the hospital with the working diagnosis of symptomatic anemia.   87 yo female with the past medical history of paroxysmal atrial fibrillation, diastolic heart failure, CKD stage IV, CVA, PE/DVT, T2DM, HTN, dyslipidemia, right breast cancer and distal fibular fracture who presented with dyspnea.  At home patient had worsening dyspnea, cough, wheezing and generalized weakness. Has a boot on right leg for ambulation but mainly using wheelchair for transportation. On her initial physical examination her blood pressure was 114/82, HR 1113. RR 24 and temperature 98 with oxygen saturation 99% on room air.  Her lungs had wheezing and rhonchi, heart with S1 and S2 present and rhythmic, abdomen soft and no lower extremity edema.   Sodium 137, potassium 4.4, chloride 99, bicarb 30, glucose 241, BUN 89, creatinine 2.59, BNP 265, high sensitive troponin 66, white count 10.9, hemoglobin 6.3,  hematocrit 20.1, platelets 294. SARS COVID-19 negative.  Urine analysis specific gravity less than 1.005.  Chest radiograph with no infiltrates.  EKG 113 bpm, normal axis, normal intervals, sinus rhythm, Q-wave V1, ST segment depression in lead II, lead III, aVF, V4-V6, no significant T wave changes.  For symptomatic anemia patient received 1 unit packed red blood cells with good toleration.  Acute bleeding was ruled out.  Patient has been resumed on oral anticoagulation with apixaban.  Patient had a prolonged hospitalization, she developed uncontrolled hypertension likely related to rebound elevated blood pressure due to discontinuation of clonidine. This medication was resumed and she achieved better blood pressure control.  She had severe nausea and vomiting, requiring nasogastric tube for decompression.  Bowel obstruction was ruled out by CT imaging of the abdomen and pelvis. Her diet was modified to liquids and advance progressively.  For acute bronchitis she received systemic steroids and bronchodilator therapy. Patient developed rapid ventricular response related to uncontrolled defibrillation.  She was placed on diltiazem with adequate rate control.   Symptomatic anemia.  No signs of bleeding, after 1 unit of packed red blood cells her hemoglobin has remained stable, at discharge 8.9 with hematocrit of 27.7. Reactive leukocytosis due to steroids, at discharge white cell count 13.9, steroids will be discontinued.  2.  Uncontrolled hypertension/hypertensive urgency/diastolic heart failure..  Her medical regimen has been modified.  Blood pressure systolic at discharge [phone]. Patient will continue taking clonidine, diltiazem, hydralazine and isosorbide. Close follow-up as an outpatient.  No signs of acute heart failure decompensation.  Continue diuretic therapy with furosemide.  3.  Acute on chronic bronchitis. Patient received systemic steroids along with bronchodilator therapy.  CT chest with chronic scars on the right lung, no frank new infiltrates. Was able to see some bronchiectasis. Patient will resume using home oxygen, bronchodilator therapy and inhaled corticosteroids.  4.  Paroxysmal atrial fibrillation.  Patient developed atrial fibrillation with rapid ventricular response, required intravenous diltiazem, now she has been transitioned to oral diltiazem.  Continue anticoagulation with apixaban.  5.  Chronic kidney disease stage IV.  Hyponatremia.  She had a close follow-up on her kidney function electrolytes, at discharge sodium 134, potassium 4.5, chloride 94, bicarb 29, glucose 169, BUN 77, creatinine 2.55. Continue furosemide, follow-up as an outpatient.  6.  Type 2 diabetes mellitus with steroid-induced hyperglycemia. Patient required insulin therapy to cover hyperglycemia. Discharge steroids will be discontinued. Continue home diabetic regimen with glipizide and close glucose monitoring at home.   Discharge Diagnoses:  Principal Problem:   Symptomatic anemia Active Problems:   Chronic diastolic congestive heart failure (HCC)   CKD (chronic kidney disease), stage IV (HCC)   Type 2 diabetes mellitus with stage 4 chronic kidney disease (HCC)   Hypertension associated with diabetes ([place])   Paroxysmal atrial fibrillation (HCC)   Anemia    Discharge Instructions   Allergies as of 02/13/2021       Reactions   Beta Adrenergic Blockers Other (See Comments)   fatigue        Medication List     STOP taking these medications    amLODipine 2.5 MG tablet Commonly known as: NORVASC   gabapentin 100 MG capsule Commonly known as: NEURONTIN   levalbuterol 0.63 MG/3ML nebulizer solution Commonly known as: XOPENEX       TAKE these medications    acetaminophen 500 MG tablet Commonly known as: TYLENOL Take 500 mg by mouth daily as needed (for foot pain).   albuterol 108 (90 Base) MCG/ACT inhaler Commonly known as: VENTOLIN HFA Inhale  1-2 puffs into the lungs every 6 (six) hours as needed for wheezing or shortness of breath.   allopurinol 100 MG tablet Commonly known as: ZYLOPRIM Take 100 mg by mouth daily.   cholecalciferol 1000 units tablet Commonly known as: VITAMIN D Take 2,000 Units by mouth daily.   cloNIDine 0.2 MG tablet Commonly known as: CATAPRES Take 0.2 mg by mouth 2 (two) times daily.   diltiazem 60 MG tablet Commonly known as: CARDIZEM Take 1 tablet (60 mg total) by mouth every 8 (eight) hours.   Eliquis 2.5 MG Tabs tablet Generic drug: apixaban TAKE 1 TABLET BY MOUTH TWICE DAILY. What changed: how much to take   famotidine 20 MG tablet Commonly known as: PEPCID Take 20 mg by mouth daily.   furosemide 40 MG tablet Commonly known as: LASIX Take 40 mg by mouth daily. Per Dr. If over 166lb take 80mg  a day   glipiZIDE 5 MG tablet Commonly known as: GLUCOTROL Take 5 mg by mouth daily.   hydrALAZINE 50 MG tablet Commonly known as: APRESOLINE Take 50 mg by mouth 3 (three) times daily.   hydrocortisone cream 1 % Apply 1 application topically daily as needed for itching.   ipratropium-albuterol 0.5-2.5 (3) MG/3ML Soln Commonly known as: DUONEB Take 3 mLs by nebulization every 6 (six) hours as needed (wheezing and shortness of breath).   isosorbide mononitrate 30 MG 24 hr tablet Commonly known as: IMDUR Take 1 tablet (30 mg total) by mouth daily.   mometasone-formoterol 200-5 MCG/ACT Aero Commonly known as: DULERA Inhale 2 puffs into the lungs 2 (two) times daily.   MUCINEX DM PO  Take 30 mLs by mouth daily as needed (cough).   polyethylene glycol 17 g packet Commonly known as: MIRALAX / GLYCOLAX Take 17 g by mouth daily.   True Metrix Blood Glucose Test test strip Generic drug: glucose blood   TRUEplus Lancets 28G Misc Apply topically.               Durable Medical Equipment  (From admission, onward)           Start     Ordered   02/13/21 0941  For home use  only DME Shower stool  Once        02/13/21 0940   02/13/21 0940  For home use only DME oxygen  Once       Question Answer Comment  Length of Need 6 Months   Mode or (Route) Nasal cannula   Liters per Minute 2   Frequency Continuous (stationary and portable oxygen unit needed)   Oxygen conserving device Yes   Oxygen delivery system Gas      02/13/21 0939            Allergies  Allergen Reactions   Beta Adrenergic Blockers Other (See Comments)    fatigue      Procedures/Studies: CT ABDOMEN PELVIS WO CONTRAST  Result Date: 02/11/2021 CLINICAL DATA:  Nausea and vomiting, respiratory illness, previous history of right breast cancer EXAM: CT CHEST, ABDOMEN AND PELVIS WITHOUT CONTRAST TECHNIQUE: Multidetector CT imaging of the chest, abdomen and pelvis was performed following the standard protocol without IV contrast. Unenhanced CT was performed per clinician order. Lack of IV contrast limits sensitivity and specificity, especially for evaluation of abdominal/pelvic solid viscera. COMPARISON:  12/18/2020, 02/10/2021 FINDINGS: CT CHEST FINDINGS Cardiovascular: Dense calcification of the mitral annulus is again noted. No pericardial effusion. Normal caliber of the thoracic aorta. Evaluation of the vascular lumen is limited without IV contrast. Stable atherosclerosis of the aortic arch. Mediastinum/Nodes: No enlarged mediastinal, hilar, or axillary lymph nodes. Thyroid gland, trachea, and esophagus demonstrate no significant findings. Enteric catheter extends into the gastric lumen. Lungs/Pleura: Chronic right upper lobe scarring. No acute airspace disease. Trace bilateral pleural effusions, right greater than left. No pneumothorax. Central airways are patent. Musculoskeletal: No acute or destructive bony lesions. Reconstructed images demonstrate no additional findings. CT ABDOMEN PELVIS FINDINGS Hepatobiliary: Calcified gallstones are identified without acute cholecystitis. Unremarkable  unenhanced appearance of the liver. Pancreas: Unremarkable unenhanced appearance. Spleen: Unremarkable unenhanced appearance. Adrenals/Urinary Tract: Stable low-attenuation left adrenal thickening consistent with hyperplasia or adenoma. The right adrenals unremarkable. There is mild bilateral renal cortical atrophy. Small left renal cortical cysts are noted. No urinary tract calculi or obstructive uropathy within either kidney. Bladder is moderately distended without filling defect. Stomach/Bowel: No bowel obstruction or ileus. Normal appendix right lower quadrant. Distal colonic diverticulosis without diverticulitis. No bowel wall thickening or inflammatory change. Enteric catheter tip within the gastric antrum. Vascular/Lymphatic: Aortic atherosclerosis. No enlarged abdominal or pelvic lymph nodes. Reproductive: Uterus and bilateral adnexa are unremarkable. Other: No free fluid or free gas. Fat containing umbilical hernia. No bowel herniation. Musculoskeletal: No acute or destructive bony lesions. Reconstructed images demonstrate no additional findings. IMPRESSION: 1. Trace bilateral pleural effusions. 2. Cholelithiasis without cholecystitis. 3. Distal colonic diverticulosis without diverticulitis. 4. Fat containing umbilical hernia. 5.  Aortic Atherosclerosis (ICD10-I70.0). Electronically Signed   By: Randa Ngo M.D.   On: 02/11/2021 18:28   DG Chest 2 View  Result Date: 02/07/2021 CLINICAL DATA:  Shortness of breath, productive cough EXAM: CHEST - 2  VIEW COMPARISON:  Chest radiograph 12/18/2020 FINDINGS: A cardiac loop recorder is again seen. The cardiomediastinal silhouette is stable. There are dense mitral annular calcifications. There is slight asymmetric elevation of the right hemidiaphragm, unchanged. There is no focal consolidation or pulmonary edema. There is no pleural effusion or pneumothorax There is no acute osseous abnormality. Right axillary surgical clips are again noted. IMPRESSION: 1. No  radiographic evidence of acute cardiopulmonary process. 2. Dense mitral annular calcifications. Electronically Signed   By: Valetta Mole M.D.   On: 02/07/2021 13:46   DG Ankle Complete Right  Result Date: 01/30/2021 CLINICAL DATA:  fall r knee, ankle, hip pain EXAM: RIGHT ANKLE - COMPLETE 3+ VIEW COMPARISON:  None. FINDINGS: There is evidence of nondisplaced fracture of the distal fibular diaphysis. There is ankle soft tissue swelling. Vascular calcifications. There is diffuse midfoot arthritis. Plantar calcaneal spurring. IMPRESSION: Findings suggest a nondisplaced fracture of the distal fibular diaphysis. Ankle soft tissue swelling. Electronically Signed   By: Maurine Simmering M.D.   On: 01/30/2021 14:24   DG Abd 1 View  Result Date: 02/11/2021 CLINICAL DATA:  Nasogastric tube placement. EXAM: ABDOMEN - 1 VIEW COMPARISON:  February 10, 2021. FINDINGS: Distal tip of nasogastric tube is seen in expected position of distal stomach. No abnormal bowel dilatation is noted. IMPRESSION: Distal tip of nasogastric tube seen in expected position of distal stomach. Electronically Signed   By: Marijo Conception M.D.   On: 02/11/2021 07:35   DG Abd 1 View  Result Date: 02/10/2021 CLINICAL DATA:  Vomiting.  NG tube placement EXAM: ABDOMEN - 1 VIEW COMPARISON:  06/25/2017 FINDINGS: NG tube tip is in the distal stomach. Gas throughout large and small bowel. No evidence of bowel obstruction, organomegaly or free air. IMPRESSION: NG tube tip in the distal stomach. No evidence of bowel obstruction. Electronically Signed   By: Rolm Baptise M.D.   On: 02/10/2021 18:12   CT CHEST WO CONTRAST  Result Date: 02/11/2021 CLINICAL DATA:  Nausea and vomiting, respiratory illness, previous history of right breast cancer EXAM: CT CHEST, ABDOMEN AND PELVIS WITHOUT CONTRAST TECHNIQUE: Multidetector CT imaging of the chest, abdomen and pelvis was performed following the standard protocol without IV contrast. Unenhanced CT was performed  per clinician order. Lack of IV contrast limits sensitivity and specificity, especially for evaluation of abdominal/pelvic solid viscera. COMPARISON:  12/18/2020, 02/10/2021 FINDINGS: CT CHEST FINDINGS Cardiovascular: Dense calcification of the mitral annulus is again noted. No pericardial effusion. Normal caliber of the thoracic aorta. Evaluation of the vascular lumen is limited without IV contrast. Stable atherosclerosis of the aortic arch. Mediastinum/Nodes: No enlarged mediastinal, hilar, or axillary lymph nodes. Thyroid gland, trachea, and esophagus demonstrate no significant findings. Enteric catheter extends into the gastric lumen. Lungs/Pleura: Chronic right upper lobe scarring. No acute airspace disease. Trace bilateral pleural effusions, right greater than left. No pneumothorax. Central airways are patent. Musculoskeletal: No acute or destructive bony lesions. Reconstructed images demonstrate no additional findings. CT ABDOMEN PELVIS FINDINGS Hepatobiliary: Calcified gallstones are identified without acute cholecystitis. Unremarkable unenhanced appearance of the liver. Pancreas: Unremarkable unenhanced appearance. Spleen: Unremarkable unenhanced appearance. Adrenals/Urinary Tract: Stable low-attenuation left adrenal thickening consistent with hyperplasia or adenoma. The right adrenals unremarkable. There is mild bilateral renal cortical atrophy. Small left renal cortical cysts are noted. No urinary tract calculi or obstructive uropathy within either kidney. Bladder is moderately distended without filling defect. Stomach/Bowel: No bowel obstruction or ileus. Normal appendix right lower quadrant. Distal colonic diverticulosis without diverticulitis. No bowel wall  thickening or inflammatory change. Enteric catheter tip within the gastric antrum. Vascular/Lymphatic: Aortic atherosclerosis. No enlarged abdominal or pelvic lymph nodes. Reproductive: Uterus and bilateral adnexa are unremarkable. Other: No free  fluid or free gas. Fat containing umbilical hernia. No bowel herniation. Musculoskeletal: No acute or destructive bony lesions. Reconstructed images demonstrate no additional findings. IMPRESSION: 1. Trace bilateral pleural effusions. 2. Cholelithiasis without cholecystitis. 3. Distal colonic diverticulosis without diverticulitis. 4. Fat containing umbilical hernia. 5.  Aortic Atherosclerosis (ICD10-I70.0). Electronically Signed   By: Randa Ngo M.D.   On: 02/11/2021 18:28   DG CHEST PORT 1 VIEW  Result Date: 02/10/2021 CLINICAL DATA:  Vomiting.  Bradycardia, hypertension EXAM: PORTABLE CHEST 1 VIEW COMPARISON:  02/07/2021 FINDINGS: NG tube is in the stomach. Heart is normal size. Mitral valve annular calcifications noted. No confluent airspace opacities or effusions. IMPRESSION: No active disease. Electronically Signed   By: Rolm Baptise M.D.   On: 02/10/2021 18:13   DG Knee Complete 4 Views Right  Result Date: 01/30/2021 CLINICAL DATA:  fall r knee, ankle, hip pain EXAM: RIGHT KNEE - COMPLETE 4+ VIEW COMPARISON:  None. FINDINGS: There is no evidence of acute fracture. There is tricompartment degenerative change with mild medial joint space narrowing. Trace joint effusion. Suprapatellar enthesophyte. Vascular calcifications. IMPRESSION: No acute osseous abnormality.  Trace joint effusion. Tricompartment osteoarthritis, worst in the medial compartment. Electronically Signed   By: Maurine Simmering M.D.   On: 01/30/2021 14:26   CUP PACEART REMOTE DEVICE CHECK  Result Date: 02/13/2021 ILR summary report received. Battery status OK. Normal device function. No new symptom, tachy, brady, or pause episodes. No new AF episodes. Monthly summary reports and ROV/PRN LH  DG Hip Unilat W or Wo Pelvis 2-3 Views Right  Result Date: 01/30/2021 CLINICAL DATA:  fall r knee, ankle, hip pain EXAM: DG HIP (WITH OR WITHOUT PELVIS) 2-3V RIGHT COMPARISON:  None. FINDINGS: There is no evidence of acute fracture. There is  mild right hip osteoarthritis. Vascular calcifications. IMPRESSION: No evidence of hip fracture.  Mild right hip osteoarthritis. Electronically Signed   By: Maurine Simmering M.D.   On: 01/30/2021 14:28       Subjective: Patient with no nausea or vomiting, her dyspnea is back to baseline, she has chronic wheezing and is very debilitated. She feels comfortable going home today.    Discharge Exam: Vitals:   02/13/21 0320 02/13/21 0900  BP: (!) 138/48   Pulse: 92   Resp: 18   Temp: 99.1 F (37.3 C)   SpO2: 97% 99%   Vitals:   02/12/21 2022 02/12/21 2244 02/13/21 0320 02/13/21 0900  BP: (!) 143/49 (!) 136/47 (!) 138/48   Pulse: 100 97 92   Resp: 20  18   Temp: 98.2 F (36.8 C)  99.1 F (37.3 C)   TempSrc: Oral  Oral   SpO2: 97% 97% 97% 99%  Weight:      Height:        General: Not in pain or dyspnea  Neurology: Awake and alert, non focal  E ENT: mild pallor, no icterus, oral mucosa moist Cardiovascular: No JVD. S1-S2 present, rhythmic, no gallops, rubs, or murmurs. No lower extremity edema. Pulmonary: positive  breath sounds bilaterally, positive expiratory wheezing, rhonchi (chronic wheezing per care giver at the bedside) no rales. Gastrointestinal. Abdomen soft and non tender Skin. No rashes Musculoskeletal: no joint deformities   The results of significant diagnostics from this hospitalization (including imaging, microbiology, ancillary and laboratory) are listed below for reference.  Microbiology: Recent Results (from the past 240 hour(s))  Resp Panel by RT-PCR (Flu A&B, Covid) Nasopharyngeal Swab     Status: None   Collection Time: 02/07/21 12:35 PM   Specimen: Nasopharyngeal Swab; Nasopharyngeal(NP) swabs in vial transport medium  Result Value Ref Range Status   SARS Coronavirus 2 by RT PCR NEGATIVE NEGATIVE Final    Comment: (NOTE) SARS-CoV-2 target nucleic acids are NOT DETECTED.  The SARS-CoV-2 RNA is generally detectable in upper respiratory specimens during  the acute phase of infection. The lowest concentration of SARS-CoV-2 viral copies this assay can detect is 138 copies/mL. A negative result does not preclude SARS-Cov-2 infection and should not be used as the sole basis for treatment or other patient management decisions. A negative result may occur with  improper specimen collection/handling, submission of specimen other than nasopharyngeal swab, presence of viral mutation(s) within the areas targeted by this assay, and inadequate number of viral copies(<138 copies/mL). A negative result must be combined with clinical observations, patient history, and epidemiological information. The expected result is Negative.  Fact Sheet for Patients:  EntrepreneurPulse.com.au  Fact Sheet for Healthcare Providers:  IncredibleEmployment.be  This test is no t yet approved or cleared by the [place] FDA and  has been authorized for detection and/or diagnosis of SARS-CoV-2 by FDA under an Emergency Use Authorization (EUA). This EUA will remain  in effect (meaning this test can be used) for the duration of the COVID-19 declaration under Section 564(b)(1) of the Act, 21 U.S.C.section 360bbb-3(b)(1), unless the authorization is terminated  or revoked sooner.       Influenza A by PCR NEGATIVE NEGATIVE Final   Influenza B by PCR NEGATIVE NEGATIVE Final    Comment: (NOTE) The Xpert Xpress SARS-CoV-2/FLU/RSV plus assay is intended as an aid in the diagnosis of influenza from Nasopharyngeal swab specimens and should not be used as a sole basis for treatment. Nasal washings and aspirates are unacceptable for Xpert Xpress SARS-CoV-2/FLU/RSV testing.  Fact Sheet for Patients: EntrepreneurPulse.com.au  Fact Sheet for Healthcare Providers: IncredibleEmployment.be  This test is not yet approved or cleared by the [place] FDA and has been authorized for detection and/or  diagnosis of SARS-CoV-2 by FDA under an Emergency Use Authorization (EUA). This EUA will remain in effect (meaning this test can be used) for the duration of the COVID-19 declaration under Section 564(b)(1) of the Act, 21 U.S.C. section 360bbb-3(b)(1), unless the authorization is terminated or revoked.  Performed at [facility], [place] [address], [place], [place] [address]      Labs: BNP (last 3 results) Recent Labs    12/18/20 1230 02/07/21 1611  BNP 167.0* 562.5*   Basic Metabolic Panel: Recent Labs  Lab 02/09/21 0653 02/10/21 0619 02/11/21 1216 02/12/21 0436 02/13/21 0450  NA 139 137 138 136 134*  K 4.1 4.5 3.9 4.0 4.5  CL 102 98 99 95* 94*  CO2 30 29 28  32 29  GLUCOSE 206* 300* 159* 126* 169*  BUN 69* 64* 53* 59* 77*  CREATININE 2.44* 2.38* 2.09* 2.36* 2.55*  CALCIUM 10.4* 10.6* 10.9* 10.2 10.0   Liver Function Tests: Recent Labs  Lab 02/07/21 1611  AST 16  ALT 14  ALKPHOS 78  BILITOT 0.6  PROT 6.7  ALBUMIN 3.4*   No results for input(s): LIPASE, AMYLASE in the last 168 hours. No results for input(s): AMMONIA in the last 168 hours. CBC: Recent Labs  Lab 02/07/21 1611 02/08/21 0426 02/09/21 6389 02/10/21 3734 02/11/21 1216 02/12/21 0436  02/13/21 0450  WBC 10.9*   < > 7.1 6.4 14.6* 11.4* 13.9*  NEUTROABS 5.8  --   --   --   --   --   --   HGB 6.3*   < > 8.4* 8.8* 10.9* 8.9* 8.9*  HCT 20.1*   < > 26.5* 28.3* 33.6* 27.6* 27.7*  MCV 101.5*   < > 101.1* 101.8* 102.4* 99.6 100.0  PLT 294   < > 237 251 278 280 301   < > = values in this interval not displayed.   Cardiac Enzymes: No results for input(s): CKTOTAL, CKMB, CKMBINDEX, TROPONINI in the last 168 hours. BNP: Invalid input(s): POCBNP CBG: Recent Labs  Lab 02/12/21 0817 02/12/21 1150 02/12/21 1652 02/12/21 2031 02/13/21 0752  GLUCAP 120* 199* 248* 301* 156*   D-Dimer No results for input(s): DDIMER in the last 72 hours. Hgb A1c No results for input(s): HGBA1C in  the last 72 hours. Lipid Profile No results for input(s): CHOL, HDL, LDLCALC, TRIG, CHOLHDL, LDLDIRECT in the last 72 hours. Thyroid function studies No results for input(s): TSH, T4TOTAL, T3FREE, THYROIDAB in the last 72 hours.  Invalid input(s): FREET3 Anemia work up No results for input(s): VITAMINB12, FOLATE, FERRITIN, TIBC, IRON, RETICCTPCT in the last 72 hours. Urinalysis    Component Value Date/Time   COLORURINE YELLOW 02/07/2021 2200   APPEARANCEUR CLEAR 02/07/2021 2200   LABSPEC <=1.005 02/07/2021 2200   PHURINE 6.0 02/07/2021 2200   GLUCOSEU 100 (A) 02/07/2021 2200   HGBUR NEGATIVE 02/07/2021 2200   BILIRUBINUR NEGATIVE 02/07/2021 2200   KETONESUR NEGATIVE 02/07/2021 2200   PROTEINUR NEGATIVE 02/07/2021 2200   UROBILINOGEN 0.2 09/13/2013 0523   NITRITE NEGATIVE 02/07/2021 2200   LEUKOCYTESUR SMALL (A) 02/07/2021 2200   Sepsis Labs Invalid input(s): PROCALCITONIN,  WBC,  LACTICIDVEN Microbiology Recent Results (from the past 240 hour(s))  Resp Panel by RT-PCR (Flu A&B, Covid) Nasopharyngeal Swab     Status: None   Collection Time: 02/07/21 12:35 PM   Specimen: Nasopharyngeal Swab; Nasopharyngeal(NP) swabs in vial transport medium  Result Value Ref Range Status   SARS Coronavirus 2 by RT PCR NEGATIVE NEGATIVE Final    Comment: (NOTE) SARS-CoV-2 target nucleic acids are NOT DETECTED.  The SARS-CoV-2 RNA is generally detectable in upper respiratory specimens during the acute phase of infection. The lowest concentration of SARS-CoV-2 viral copies this assay can detect is 138 copies/mL. A negative result does not preclude SARS-Cov-2 infection and should not be used as the sole basis for treatment or other patient management decisions. A negative result may occur with  improper specimen collection/handling, submission of specimen other than nasopharyngeal swab, presence of viral mutation(s) within the areas targeted by this assay, and inadequate number of  viral copies(<138 copies/mL). A negative result must be combined with clinical observations, patient history, and epidemiological information. The expected result is Negative.  Fact Sheet for Patients:  EntrepreneurPulse.com.au  Fact Sheet for Healthcare Providers:  IncredibleEmployment.be  This test is no t yet approved or cleared by the [place] FDA and  has been authorized for detection and/or diagnosis of SARS-CoV-2 by FDA under an Emergency Use Authorization (EUA). This EUA will remain  in effect (meaning this test can be used) for the duration of the COVID-19 declaration under Section 564(b)(1) of the Act, 21 U.S.C.section 360bbb-3(b)(1), unless the authorization is terminated  or revoked sooner.       Influenza A by PCR NEGATIVE NEGATIVE Final   Influenza B by PCR NEGATIVE NEGATIVE Final  Comment: (NOTE) The Xpert Xpress SARS-CoV-2/FLU/RSV plus assay is intended as an aid in the diagnosis of influenza from Nasopharyngeal swab specimens and should not be used as a sole basis for treatment. Nasal washings and aspirates are unacceptable for Xpert Xpress SARS-CoV-2/FLU/RSV testing.  Fact Sheet for Patients: EntrepreneurPulse.com.au  Fact Sheet for Healthcare Providers: IncredibleEmployment.be  This test is not yet approved or cleared by the [place] FDA and has been authorized for detection and/or diagnosis of SARS-CoV-2 by FDA under an Emergency Use Authorization (EUA). This EUA will remain in effect (meaning this test can be used) for the duration of the COVID-19 declaration under Section 564(b)(1) of the Act, 21 U.S.C. section 360bbb-3(b)(1), unless the authorization is terminated or revoked.  Performed at [facility], [place] [address] [place], [place] [address]      Time coordinating discharge: 45 minutes  SIGNED:   Tawni Millers, MD  [facility] 02/13/2021, 9:39 AM

## 2021-02-13 NOTE — Progress Notes (Signed)
Received report. Assuming care of patient at this time.  °

## 2021-02-14 DIAGNOSIS — N184 Chronic kidney disease, stage 4 (severe): Secondary | ICD-10-CM | POA: Diagnosis not present

## 2021-02-14 DIAGNOSIS — I129 Hypertensive chronic kidney disease with stage 1 through stage 4 chronic kidney disease, or unspecified chronic kidney disease: Secondary | ICD-10-CM | POA: Diagnosis not present

## 2021-02-14 DIAGNOSIS — E1121 Type 2 diabetes mellitus with diabetic nephropathy: Secondary | ICD-10-CM | POA: Diagnosis not present

## 2021-02-14 DIAGNOSIS — E7800 Pure hypercholesterolemia, unspecified: Secondary | ICD-10-CM | POA: Diagnosis not present

## 2021-02-19 ENCOUNTER — Other Ambulatory Visit: Payer: Self-pay

## 2021-02-19 ENCOUNTER — Ambulatory Visit: Payer: Medicare Other | Admitting: Internal Medicine

## 2021-02-19 ENCOUNTER — Telehealth: Payer: Self-pay | Admitting: Internal Medicine

## 2021-02-19 ENCOUNTER — Encounter: Payer: Self-pay | Admitting: Internal Medicine

## 2021-02-19 VITALS — BP 112/60 | HR 60 | Ht 59.0 in | Wt 169.6 lb

## 2021-02-19 DIAGNOSIS — I480 Paroxysmal atrial fibrillation: Secondary | ICD-10-CM

## 2021-02-19 NOTE — Telephone Encounter (Signed)
Received call transferred from operator and spoke with Elenore Rota.  He is currently seeing patient.  He reports patient's heart rate is 46. BP is 128/44.  He checked apical and radial heart rate and with pulse ox. Patient was sleeping when he first arrived but is now eating.  Patient denies dizziness or any other problems.  Just took diltiazem and hydralazine.  Call back number for Elenore Rota is [phone]

## 2021-02-19 NOTE — Patient Instructions (Addendum)
Medication Instructions:  Your physician recommends that you continue on your current medications as directed. Please refer to the Current Medication list given to you today.  Labwork: None ordered.  Testing/Procedures: None ordered.  Follow-Up: Your physician wants you to follow-up in: one year with [name], MD or one of the following Advanced Practice Providers on your designated Care Team:   Tommye Standard, [place] Legrand Como "Jonni Sanger" Chalmers Cater, [place]  Remote monitoring is used to monitor your loop recorder from home.    Any Other Special Instructions Will Be Listed Below (If Applicable).  If you need a refill on your cardiac medications before your next appointment, please call your pharmacy.

## 2021-02-19 NOTE — Progress Notes (Signed)
HPI Sabrina [name] returns today for ongoing evaluation of cryptogenic stroke. She is a  Pleasant elderly woman, with a h/o HTN, obesity, and DM, who sustained a stroke several months ago. She did not initially undergo insertion of an ILR when she was in the hospital and her ILR was placed over 3 years ago . She has been found to have atrial fib with a CVR and RVR. She denies any new neurological symptoms and she has not had any bleeding. She has  become more sedentary.  Allergies  Allergen Reactions   Beta Adrenergic Blockers Other (See Comments)    fatigue     Current Outpatient Medications  Medication Sig Dispense Refill   acetaminophen (TYLENOL) 500 MG tablet Take 500 mg by mouth daily as needed (for foot pain).     albuterol (VENTOLIN HFA) 108 (90 Base) MCG/ACT inhaler Inhale 1-2 puffs into the lungs every 6 (six) hours as needed for wheezing or shortness of breath.     allopurinol (ZYLOPRIM) 100 MG tablet Take 100 mg by mouth daily.     cholecalciferol (VITAMIN D) 1000 UNITS tablet Take 2,000 Units by mouth daily.     cloNIDine (CATAPRES) 0.2 MG tablet Take 0.2 mg by mouth 2 (two) times daily.     Dextromethorphan-guaiFENesin (MUCINEX DM PO) Take 30 mLs by mouth daily as needed (cough).     diltiazem (CARDIZEM) 60 MG tablet Take 1 tablet (60 mg total) by mouth every 8 (eight) hours. 90 tablet 0   ELIQUIS 2.5 MG TABS tablet TAKE 1 TABLET BY MOUTH TWICE DAILY. (Patient taking differently: Take 2.5 mg by mouth 2 (two) times daily.) 60 tablet 11   famotidine (PEPCID) 20 MG tablet Take 20 mg by mouth daily.     furosemide (LASIX) 40 MG tablet Take 40 mg by mouth daily. Per Dr. If over 166lb take 80mg  a day     glipiZIDE (GLUCOTROL) 5 MG tablet Take 5 mg by mouth daily.     hydrALAZINE (APRESOLINE) 50 MG tablet Take 50 mg by mouth 3 (three) times daily.     hydrocortisone cream 1 % Apply 1 application topically daily as needed for itching.     ipratropium-albuterol (DUONEB) 0.5-2.5 (3)  MG/3ML SOLN Take 3 mLs by nebulization every 6 (six) hours as needed (wheezing and shortness of breath). 360 mL 0   isosorbide mononitrate (IMDUR) 30 MG 24 hr tablet Take 1 tablet (30 mg total) by mouth daily. 30 tablet 2   mometasone-formoterol (DULERA) 200-5 MCG/ACT AERO Inhale 2 puffs into the lungs 2 (two) times daily. 1 each 0   polyethylene glycol (MIRALAX / GLYCOLAX) packet Take 17 g by mouth daily.     TRUE METRIX BLOOD GLUCOSE TEST test strip      TRUEplus Lancets 28G MISC Apply topically.     No current facility-administered medications for this visit.     Past Medical History:  Diagnosis Date   Arthritis    "mostly in my hands, lower back" (06/25/2017)   Basal cell carcinoma (BCC) of face 1983   Breast cancer, right breast ([place]) 03/2013   Chronic kidney disease (CKD), stage III (moderate) ([place])    nephrologist, Dr. [name]   Dental crowns present    DVT (deep venous thrombosis) ([place]) ~ 06/2013   "? side"   Gout    "on daily RX" (06/25/2017)   Heart murmur    no known problems; states did not know she had murmur until age  79   High cholesterol    Hypertension    fluctuates, especially when stressed; has been on med. > 20 yr.   Immature cataract    Non-insulin dependent type 2 diabetes mellitus ([place])    Personal history of chemotherapy    Personal history of radiation therapy    Pulmonary embolism ([place]) ~ 06/2013   Radiation 09/06/13-10/20/13   Right Breast Cancer   Stroke ([place]) 05/2017   just visual problems since (06/25/2017)   Wears partial dentures    lower    ROS:   All systems reviewed and negative except as noted in the HPI.   Past Surgical History:  Procedure Laterality Date   AXILLARY LYMPH NODE DISSECTION Right 03/30/2013   Procedure: AXILLARY LYMPH NODE DISSECTION;  Surgeon: Rolm Bookbinder, MD;  Location: [facility];  Service: General;  Laterality: Right;   BASAL CELL CARCINOMA EXCISION  1983   "face"   BREAST BIOPSY Right 03/17/2013   BREAST  CYST EXCISION Right 11/1958   benign   BREAST LUMPECTOMY Right 03/30/2013   BREAST LUMPECTOMY WITH NEEDLE LOCALIZATION Right 03/30/2013   Procedure: BREAST LUMPECTOMY WITH NEEDLE LOCALIZATION;  Surgeon: Rolm Bookbinder, MD;  Location: [facility];  Service: General;  Laterality: Right;   DILATION AND CURETTAGE OF UTERUS     LOOP RECORDER INSERTION N/A 08/15/2017   Procedure: LOOP RECORDER INSERTION;  Surgeon: Evans Lance, MD;  Location: [facility];  Service: Cardiovascular;  Laterality: N/A;   PORT-A-CATH REMOVAL  2016   PORTACATH PLACEMENT N/A 04/15/2013   Procedure: INSERTION PORT-A-CATH;  Surgeon: Rolm Bookbinder, MD;  Location: [facility];  Service: General;  Laterality: N/A;   RE-EXCISION OF BREAST CANCER,SUPERIOR MARGINS Right 04/15/2013   Procedure: RE-EXCISION OF RIGHT BREAST  MARGINS;  Surgeon: Rolm Bookbinder, MD;  Location: [facility];  Service: General;  Laterality: Right;   TONSILLECTOMY  ~ 1935/1936     Family History  Problem Relation Age of Onset   Pneumonia Mother    Heart attack Father    Breast cancer Other 52       niece   Breast cancer Other 49       niece   Ovarian cancer Other        niece     Social History   Socioeconomic History   Marital status: Married    Spouse name: Not on file   Number of children: 0   Years of education: Not on file   Highest education level: Bachelor's degree (e.g., BA, AB, BS)  Occupational History   Not on file  Tobacco Use   Smoking status: Never   Smokeless tobacco: Never   Tobacco comments:    only smoked 2 packs cigarettes total; husband quit in 1971  Vaping Use   Vaping Use: Never used  Substance and Sexual Activity   Alcohol use: Not Currently    Alcohol/week: 3.0 standard drinks    Types: 3 Glasses of wine per week   Drug use: No   Sexual activity: Not Currently  Other Topics Concern   Not on file  Social History Narrative   Lives at home with her husband   Right  handed   Caffeine: 1 coffee daily at most    Social Determinants of Health   Financial Resource Strain: Not on file  Food Insecurity: Not on file  Transportation Needs: Not on file  Physical Activity: Not on file  Stress: Not on file  Social Connections: Not on  file  Intimate Partner Violence: Not on file     BP 112/60    Pulse 60    Ht 4\' 11"  (1.499 m)    Wt 169 lb 9.6 oz (76.9 kg)    SpO2 99%    BMI 34.26 kg/m   Physical Exam:  Well appearing NAD HEENT: Unremarkable Neck:  No JVD, no thyromegally Lymphatics:  No adenopathy Back:  No CVA tenderness Lungs:  Clear with no wheezes HEART:  Regular rate rhythm, no murmurs, no rubs, no clicks Abd:  soft, positive bowel sounds, no organomegally, no rebound, no guarding Ext:  2 plus pulses, no edema, no cyanosis, no clubbing Skin:  No rashes no nodules Neuro:  CN II through XII intact, motor grossly intact   DEVICE  Normal device function.  See PaceArt for details. Atrial fib is present  Assess/Plan:  1. Cryptogenic stroke - the patient's has mostly recovered from her stroke. She has been found to have atrial fib and has been placed on systemic anti-coagulation. 2. Sinus node dysfunction - her resting hr is a little slow today but she is asymptomatic. No changes in her meds for now. 3. HTN - her blood pressure is controlled on a combination of clonidine and verapamil. 4. Coags - she has not had any bleeding on Eliquis.   Mikle Bosworth.D.

## 2021-02-19 NOTE — Telephone Encounter (Signed)
Returned call to [employer].  Per Elenore Rota, Pt is not symptomatic.  She is saying she is tired.  This nurse advised that Pt had come for an office visit today and was probably fatigued from that.  [employer] if Pt is asymptomatic with a low heart rate we would continue to monitor.  Advised that Pt has a loop implant that would send [place] a report if Pt had pauses.  Advised to have Pt call if she has presyncope/syncope.

## 2021-02-19 NOTE — Telephone Encounter (Signed)
° °  STAT if HR is under 50 or over 120 (normal HR is 60-100 beats per minute)  What is your heart rate? 46 BP 128/44  Do you have a log of your heart rate readings (document readings)?   Do you have any other symptoms? [facility], said he is there right now with pt and HR is at 46 witn BP 128/44. He wanted to know if pt needs to be sent to ED

## 2021-02-20 NOTE — Progress Notes (Signed)
[facility] Summary Report / Loop Recorder 

## 2021-02-23 DIAGNOSIS — R0600 Dyspnea, unspecified: Secondary | ICD-10-CM | POA: Diagnosis not present

## 2021-02-23 DIAGNOSIS — R269 Unspecified abnormalities of gait and mobility: Secondary | ICD-10-CM | POA: Diagnosis not present

## 2021-02-23 DIAGNOSIS — R9431 Abnormal electrocardiogram [ECG] [EKG]: Secondary | ICD-10-CM | POA: Diagnosis not present

## 2021-02-27 DIAGNOSIS — I480 Paroxysmal atrial fibrillation: Secondary | ICD-10-CM | POA: Diagnosis not present

## 2021-02-27 DIAGNOSIS — D649 Anemia, unspecified: Secondary | ICD-10-CM | POA: Diagnosis not present

## 2021-02-27 DIAGNOSIS — N184 Chronic kidney disease, stage 4 (severe): Secondary | ICD-10-CM | POA: Diagnosis not present

## 2021-02-27 DIAGNOSIS — S82454D Nondisplaced comminuted fracture of shaft of right fibula, subsequent encounter for closed fracture with routine healing: Secondary | ICD-10-CM | POA: Diagnosis not present

## 2021-02-27 DIAGNOSIS — D631 Anemia in chronic kidney disease: Secondary | ICD-10-CM | POA: Diagnosis not present

## 2021-02-27 DIAGNOSIS — E1121 Type 2 diabetes mellitus with diabetic nephropathy: Secondary | ICD-10-CM | POA: Diagnosis not present

## 2021-02-27 DIAGNOSIS — I129 Hypertensive chronic kidney disease with stage 1 through stage 4 chronic kidney disease, or unspecified chronic kidney disease: Secondary | ICD-10-CM | POA: Diagnosis not present

## 2021-03-04 ENCOUNTER — Other Ambulatory Visit: Payer: Self-pay

## 2021-03-04 ENCOUNTER — Emergency Department (HOSPITAL_COMMUNITY): Payer: Medicare Other

## 2021-03-04 ENCOUNTER — Encounter (HOSPITAL_COMMUNITY): Payer: Self-pay

## 2021-03-04 ENCOUNTER — Emergency Department (HOSPITAL_COMMUNITY)
Admission: EM | Admit: 2021-03-04 | Discharge: 2021-03-04 | Disposition: A | Discharge status: Home / Self Care | Payer: Medicare Other | Attending: Emergency Medicine | Admitting: Emergency Medicine

## 2021-03-04 DIAGNOSIS — J40 Bronchitis, not specified as acute or chronic: Secondary | ICD-10-CM | POA: Insufficient documentation

## 2021-03-04 DIAGNOSIS — Z20822 Contact with and (suspected) exposure to covid-19: Secondary | ICD-10-CM | POA: Diagnosis not present

## 2021-03-04 DIAGNOSIS — Z7901 Long term (current) use of anticoagulants: Secondary | ICD-10-CM | POA: Insufficient documentation

## 2021-03-04 DIAGNOSIS — Z853 Personal history of malignant neoplasm of breast: Secondary | ICD-10-CM | POA: Insufficient documentation

## 2021-03-04 DIAGNOSIS — E1165 Type 2 diabetes mellitus with hyperglycemia: Secondary | ICD-10-CM | POA: Diagnosis not present

## 2021-03-04 DIAGNOSIS — I129 Hypertensive chronic kidney disease with stage 1 through stage 4 chronic kidney disease, or unspecified chronic kidney disease: Secondary | ICD-10-CM | POA: Diagnosis not present

## 2021-03-04 DIAGNOSIS — Z7984 Long term (current) use of oral hypoglycemic drugs: Secondary | ICD-10-CM | POA: Insufficient documentation

## 2021-03-04 DIAGNOSIS — N189 Chronic kidney disease, unspecified: Secondary | ICD-10-CM | POA: Diagnosis not present

## 2021-03-04 DIAGNOSIS — R0602 Shortness of breath: Secondary | ICD-10-CM | POA: Diagnosis not present

## 2021-03-04 DIAGNOSIS — R062 Wheezing: Secondary | ICD-10-CM | POA: Diagnosis not present

## 2021-03-04 DIAGNOSIS — R0689 Other abnormalities of breathing: Secondary | ICD-10-CM | POA: Diagnosis not present

## 2021-03-04 DIAGNOSIS — R0902 Hypoxemia: Secondary | ICD-10-CM | POA: Diagnosis not present

## 2021-03-04 DIAGNOSIS — J90 Pleural effusion, not elsewhere classified: Secondary | ICD-10-CM | POA: Diagnosis not present

## 2021-03-04 DIAGNOSIS — E1122 Type 2 diabetes mellitus with diabetic chronic kidney disease: Secondary | ICD-10-CM | POA: Insufficient documentation

## 2021-03-04 DIAGNOSIS — R739 Hyperglycemia, unspecified: Secondary | ICD-10-CM | POA: Diagnosis not present

## 2021-03-04 LAB — CBC WITH DIFFERENTIAL/PLATELET
Abs Immature Granulocytes: 0.03 10*3/uL (ref 0.00–0.07)
Basophils Absolute: 0 10*3/uL (ref 0.0–0.1)
Basophils Relative: 0 %
Eosinophils Absolute: 0.6 10*3/uL — ABNORMAL HIGH (ref 0.0–0.5)
Eosinophils Relative: 7 %
HCT: 29.1 % — ABNORMAL LOW (ref 36.0–46.0)
Hemoglobin: 9.2 g/dL — ABNORMAL LOW (ref 12.0–15.0)
Immature Granulocytes: 0 %
Lymphocytes Relative: 29 %
Lymphs Abs: 2.5 10*3/uL (ref 0.7–4.0)
MCH: 32.4 pg (ref 26.0–34.0)
MCHC: 31.6 g/dL (ref 30.0–36.0)
MCV: 102.5 fL — ABNORMAL HIGH (ref 80.0–100.0)
Monocytes Absolute: 0.5 10*3/uL (ref 0.1–1.0)
Monocytes Relative: 6 %
Neutro Abs: 5 10*3/uL (ref 1.7–7.7)
Neutrophils Relative %: 58 %
Platelets: 190 10*3/uL (ref 150–400)
RBC: 2.84 MIL/uL — ABNORMAL LOW (ref 3.87–5.11)
RDW: 15.6 % — ABNORMAL HIGH (ref 11.5–15.5)
WBC: 8.7 10*3/uL (ref 4.0–10.5)
nRBC: 0 % (ref 0.0–0.2)

## 2021-03-04 LAB — BASIC METABOLIC PANEL
Anion gap: 11 (ref 5–15)
BUN: 78 mg/dL — ABNORMAL HIGH (ref 8–23)
CO2: 28 mmol/L (ref 22–32)
Calcium: 10.9 mg/dL — ABNORMAL HIGH (ref 8.9–10.3)
Chloride: 97 mmol/L — ABNORMAL LOW (ref 98–111)
Creatinine, Ser: 2.83 mg/dL — ABNORMAL HIGH (ref 0.44–1.00)
GFR, Estimated: 15 mL/min — ABNORMAL LOW (ref >60)
Glucose, Bld: 298 mg/dL — ABNORMAL HIGH (ref 70–99)
Potassium: 3.9 mmol/L (ref 3.5–5.1)
Sodium: 136 mmol/L (ref 135–145)

## 2021-03-04 LAB — RESP PANEL BY RT-PCR (FLU A&B, COVID) ARPGX2
Influenza A by PCR: NEGATIVE
Influenza B by PCR: NEGATIVE
SARS Coronavirus 2 by RT PCR: NEGATIVE

## 2021-03-04 LAB — BRAIN NATRIURETIC PEPTIDE: B Natriuretic Peptide: 323 pg/mL — ABNORMAL HIGH (ref 0.0–100.0)

## 2021-03-04 MED ORDER — IPRATROPIUM-ALBUTEROL 0.5-2.5 (3) MG/3ML IN SOLN
3.0000 mL | Freq: Once | RESPIRATORY_TRACT | Status: AC
Start: 2021-03-04 — End: 2021-03-04
  Administered 2021-03-04: 3 mL via RESPIRATORY_TRACT
  Filled 2021-03-04: qty 3

## 2021-03-04 MED ORDER — BENZONATATE 100 MG PO CAPS: 100.0000 mg | ORAL_CAPSULE | Freq: Three times a day (TID) | ORAL | 0 refills | Status: DC

## 2021-03-04 MED ORDER — INSULIN ASPART 100 UNIT/ML IJ SOLN
5.0000 [IU] | Freq: Once | INTRAMUSCULAR | Status: AC
  Administered 2021-03-04: 5 [IU] via SUBCUTANEOUS
  Filled 2021-03-04: qty 0.05

## 2021-03-04 MED ORDER — ALBUTEROL SULFATE HFA 108 (90 BASE) MCG/ACT IN AERS: 2.0000 | INHALATION_SPRAY | RESPIRATORY_TRACT | Status: DC | PRN

## 2021-03-04 NOTE — ED Provider Notes (Signed)
[facility] DEPT Provider Note   CSN: [id number] Arrival date & time: 03/04/21  8469     History  Chief Complaint  Patient presents with   Shortness of Breath   Weakness    Sabrina [name] is a 88 y.o. female.  The history is provided by the patient, a caregiver and medical records. No language interpreter was used.  Shortness of Breath Weakness Associated symptoms: shortness of breath    88 year old female significant history of CHF, CKD, diabetes, hypertension, breast cancer, prior stroke, paroxysmal atrial fibrillation, anemia, brought here via EMS from home for evaluation of generalized weakness and shortness of breath.  History obtained through caregiver who is at bedside and through patient's.  Patient endorsed having shortness of breath and having trouble wheezing for about 2 days.  She denies any fever or productive cough.  She denies having sore throat, nausea vomiting or diarrhea.  No complaint of urinary symptoms.  Normally use a walker at home to transfer from chair to bed.  She does endorse decrease in appetite.  She is unsure if she gains any additional fluid retention.  She did broke her right ankle a month ago and currently wearing a boot.  She does not have any pain.  No recent sick contact  Home Medications Prior to Admission medications   Medication Sig Start Date End Date Taking? Authorizing Provider  acetaminophen (TYLENOL) 500 MG tablet Take 500 mg by mouth daily as needed (for foot pain).    [provider]  albuterol (VENTOLIN HFA) 108 (90 Base) MCG/ACT inhaler Inhale 1-2 puffs into the lungs every 6 (six) hours as needed for wheezing or shortness of breath. 03/22/19   [provider]  allopurinol (ZYLOPRIM) 100 MG tablet Take 100 mg by mouth daily. 01/17/16   [provider]  cholecalciferol (VITAMIN D) 1000 UNITS tablet Take 2,000 Units by mouth daily.    [provider]  cloNIDine (CATAPRES)  0.2 MG tablet Take 0.2 mg by mouth 2 (two) times daily.    [provider]  Dextromethorphan-guaiFENesin (MUCINEX DM PO) Take 30 mLs by mouth daily as needed (cough).    [provider]  diltiazem (CARDIZEM) 60 MG tablet Take 1 tablet (60 mg total) by mouth every 8 (eight) hours. 02/13/21 03/15/21  Arrien, Jimmy Picket, MD  ELIQUIS 2.5 MG TABS tablet TAKE 1 TABLET BY MOUTH TWICE DAILY. Patient taking differently: Take 2.5 mg by mouth 2 (two) times daily. 07/04/20   Fenton, Clint R, PA  famotidine (PEPCID) 20 MG tablet Take 20 mg by mouth daily. 11/09/19   [provider]  furosemide (LASIX) 40 MG tablet Take 40 mg by mouth daily. Per Dr. If over 166lb take 80mg  a day    [provider]  glipiZIDE (GLUCOTROL) 5 MG tablet Take 5 mg by mouth daily.    [provider]  hydrALAZINE (APRESOLINE) 50 MG tablet Take 50 mg by mouth 3 (three) times daily.    [provider]  hydrocortisone cream 1 % Apply 1 application topically daily as needed for itching.    [provider]  ipratropium-albuterol (DUONEB) 0.5-2.5 (3) MG/3ML SOLN Take 3 mLs by nebulization every 6 (six) hours as needed (wheezing and shortness of breath). 02/13/21   Arrien, Jimmy Picket, MD  isosorbide mononitrate (IMDUR) 30 MG 24 hr tablet Take 1 tablet (30 mg total) by mouth daily. 12/22/20 02/07/22  Pokhrel, Corrie Mckusick, MD  mometasone-formoterol (DULERA) 200-5 MCG/ACT AERO Inhale 2 puffs into the  lungs 2 (two) times daily. 02/13/21   Arrien, Jimmy Picket, MD  polyethylene glycol [facility] / Floria Raveling) packet Take 17 g by mouth daily.    [provider]  TRUE METRIX BLOOD GLUCOSE TEST test strip  07/19/19   [provider]  TRUEplus Lancets 28G MISC Apply topically. 08/20/19   [provider]      Allergies    Beta adrenergic blockers    Review of Systems   Review of Systems  Respiratory:  Positive for shortness of breath.   Neurological:  Positive  for weakness.  All other systems reviewed and are negative.  Physical Exam Updated Vital Signs BP (!) 165/52 (BP Location: Right Arm)    Pulse (!) 109    Temp 98.5 F (36.9 C) (Oral)    Resp (!) 24    Ht 4\' 11"  (1.499 m)    Wt 77.1 kg    SpO2 99%    BMI 34.34 kg/m  Physical Exam Vitals and nursing note reviewed.  Constitutional:      General: She is not in acute distress.    Appearance: She is well-developed. She is obese.  HENT:     Head: Atraumatic.  Eyes:     Conjunctiva/sclera: Conjunctivae normal.  Neck:     Vascular: No JVD.  Cardiovascular:     Rate and Rhythm: Tachycardia present.  Pulmonary:     Effort: Pulmonary effort is normal. Tachypnea present.     Breath sounds: Decreased breath sounds and wheezing present. No rhonchi or rales.  Musculoskeletal:     Cervical back: Neck supple.     Comments: CAM Walker boot to right leg.  Left leg with trace edema.  Skin:    Findings: No rash.  Neurological:     Mental Status: She is alert and oriented to [name], place, and time.  Psychiatric:        Mood and Affect: Mood normal.    ED Results / Procedures / Treatments   Labs (all labs ordered are listed, but only abnormal results are displayed) Labs Reviewed  BASIC METABOLIC PANEL - Abnormal; Notable for the following components:      Result Value   Chloride 97 (*)    Glucose, Bld 298 (*)    BUN 78 (*)    Creatinine, Ser 2.83 (*)    Calcium 10.9 (*)    GFR, Estimated 15 (*)    All other components within normal limits  BRAIN NATRIURETIC PEPTIDE - Abnormal; Notable for the following components:   B Natriuretic Peptide 323.0 (*)    All other components within normal limits  CBC WITH DIFFERENTIAL/PLATELET - Abnormal; Notable for the following components:   RBC 2.84 (*)    Hemoglobin 9.2 (*)    HCT 29.1 (*)    MCV 102.5 (*)    RDW 15.6 (*)    Eosinophils Absolute 0.6 (*)    All other components within normal limits  RESP PANEL BY RT-PCR (FLU A&B, COVID) [id number]     EKG EKG Interpretation  Date/Time:  Sunday March 04 2021 09:57:09 EST Ventricular Rate:  99 PR Interval:  183 QRS Duration: 93 QT Interval:  362 QTC Calculation: 465 R Axis:   55 Text Interpretation: Sinus rhythm Consider left ventricular hypertrophy Confirmed by Lacretia Leigh ([id number]) on 03/04/2021 12:27:13 PM  Radiology DG Chest 2 View  Result Date: 03/04/2021 CLINICAL DATA:  Shortness of breath. EXAM: CHEST - 2 VIEW COMPARISON:  February 10, 2021, August 15, 2017 FINDINGS: The heart  size and mediastinal contours are stable. There are question 2-3 mm nodules in the left mid lung. There is no focal infiltrate, pulmonary edema. There is minimal right posterior pleural effusion. The visualized skeletal structures are stable. Stable right breast and axillary surgical clips are noted. IMPRESSION: No focal pneumonia. Question 2-3 mm nodules in the left mid lung. Recommend further evaluation with CT chest on outpatient basis. Electronically Signed   By: Abelardo Diesel M.D.   On: 03/04/2021 11:07    Procedures Procedures    Medications Ordered in ED Medications  albuterol (VENTOLIN HFA) 108 (90 Base) MCG/ACT inhaler 2 puff (has no administration in time range)  ipratropium-albuterol (DUONEB) 0.5-2.5 (3) MG/3ML nebulizer solution 3 mL (3 mLs Nebulization Given 03/04/21 1006)  insulin aspart (novoLOG) injection 5 Units (5 Units Subcutaneous Given 03/04/21 1016)    ED Course/ Medical Decision Making/ A&P                           Medical Decision Making  BP (!) 165/52 (BP Location: Right Arm)    Pulse (!) 109    Temp 98.5 F (36.9 C) (Oral)    Resp (!) 24    Ht 4\' 11"  (1.499 m)    Wt 77.1 kg    SpO2 99%    BMI 34.34 kg/m   10:01 AM This is an elderly female significant history of CHF who is here with complaints of shortness of breath and wheezing.  She does not have a history of COPD or asthma.  She is currently wearing supplemental oxygen at 2 L while maintaining O2 sat above 95%.   Patient without any significant pain and she is mentating at baseline.  She does have supplemental oxygen at home to use at night as needed.  She currently denies having any active chest pain therefore have low suspicion for ACS.  Although she did have history of PE in the past and has injured her right ankle which predispose her to risk for PE, her symptom is more consistent with likely an infectious pulmonary source.  She is also on Eliquis which makes me less concerned for PE as a cause.  Work-up proceeded.  1:08 PM I have independently reviewed and interpreted labs, EKG, and imaging that was obtained today.  Patient have negative viral respiratory panel, she has elevated CBG of greater than 300 earlier per EMS and was given 5 unit of insulin by me.  Current CBG is 298.  Evidence of chronic kidney disease with creatinine of 2.83, similar to prior value.  BNP is elevated at 323 but not significantly off her baseline compared to prior value.  Chest x-ray today showed no evidence of pneumonia.  There is a question to-3 mm nodule in the left midlung.  I mention this to patient and recommend a CT scan of the chest on an outpatient basis.  I felt the patient's symptoms is consistent with bronchitis.  I doubt this is CHF exacerbation.  I did consider admission for management of her bronchitis however when considering increased risk of decompensation or potential exposure to hospital-acquired infection I felt patient would best served to be discharged back to home.  She does have 24 hours nursing care.  Patient given albuterol inhaler to use as needed as well as cough medication.  I did consider prednisone as treatment for bronchitis but due to her elevated CBG it would create more harm than good.  Discussed care with Dr. Zenia Resides  who agrees with plan.  Return precaution given.        Final Clinical Impression(s) / ED Diagnoses Final diagnoses:  Bronchitis  Hyperglycemia    Rx / DC Orders ED Discharge  Orders          Ordered    benzonatate (TESSALON) 100 MG capsule  Every 8 hours        03/04/21 1306              Domenic Moras, PA-C 03/04/21 1312    Lacretia Leigh, MD 03/07/21 1308

## 2021-03-04 NOTE — ED Notes (Signed)
Dc instructions, prescription and plan of care discussed w/ pt's care giver at bedside w/ pt present.  ED Tech and ED RN assisted pt in getting dressed and into wheelchair.  Pt wheeled out to private vehicle and assisted into vehicle. Caregiver denies any questions or concerns upon dc. Pt in NAD upon dc.

## 2021-03-04 NOTE — ED Provider Notes (Signed)
I provided a substantive portion of the care of this patient.  I personally performed the entirety of the medical decision making for this encounter.  EKG Interpretation  Date/Time:  Sunday March 04 2021 09:57:09 EST Ventricular Rate:  99 PR Interval:  183 QRS Duration: 93 QT Interval:  362 QTC Calculation: 465 R Axis:   55 Text Interpretation: Sinus rhythm Consider left ventricular hypertrophy Confirmed by Lacretia Leigh ([id number]) on 03/04/2021 12:34:45 PM   88 year old female presented with shortness of breath.  Chest x-ray without acute findings here.  COVID and flu test negative.  Does have some wheezing expect patient has a bronchitis.  Will treat for that and return precautions given.  Patient has excellent home care and can use oxygen at home   Lacretia Leigh, MD 03/04/21 1233

## 2021-03-04 NOTE — ED Triage Notes (Signed)
Pt bib ems for increased SOB and weakness. Pt from home w/ home health aid. EMS gave duoneb x1 for wheezing w/ no improvement. Pt cbg 346 per ems.

## 2021-03-04 NOTE — Discharge Instructions (Addendum)
You have been evaluated for your symptoms.  I suspect you have symptoms related to bronchitis which is a viral infection of your.  Please use albuterol inhaler 2 puffs every 4 hours as needed for shortness of breath.  You may use cough medication to help you with your cough.  Continue to use supplemental oxygen at home as needed for comfort.  Your blood sugar is elevated today, you will need to monitor closely.  Return to the ER promptly if your symptoms worsen.  Your COVID and flu test is negative.  Your chest xray shows a 83mm nodule on the left mid lung.  Please request your doctor for a follow up CT scan of the chest for further assessment.

## 2021-03-08 ENCOUNTER — Ambulatory Visit (INDEPENDENT_AMBULATORY_CARE_PROVIDER_SITE_OTHER): Payer: Medicare Other | Admitting: Sports Medicine

## 2021-03-08 ENCOUNTER — Other Ambulatory Visit: Payer: Self-pay

## 2021-03-08 ENCOUNTER — Encounter: Payer: Self-pay | Admitting: Sports Medicine

## 2021-03-08 DIAGNOSIS — M79674 Pain in right toe(s): Secondary | ICD-10-CM

## 2021-03-08 DIAGNOSIS — M79675 Pain in left toe(s): Secondary | ICD-10-CM

## 2021-03-08 DIAGNOSIS — I739 Peripheral vascular disease, unspecified: Secondary | ICD-10-CM

## 2021-03-08 DIAGNOSIS — B351 Tinea unguium: Secondary | ICD-10-CM

## 2021-03-08 DIAGNOSIS — E119 Type 2 diabetes mellitus without complications: Secondary | ICD-10-CM

## 2021-03-08 NOTE — Progress Notes (Signed)
Subjective: Sabrina [name] is a 88 y.o. female patient with history of diabetes who presents to office today complaining of long,mildly painful nails while in shoes; unable to trim.  Reports she had a fall and has been being treated by orthopedics for the last 2 months for a stress fracture of the right ankle.  No other pedal complaints noted.   Patient is assisted by facility aide again this visit.   Patient Active Problem List   Diagnosis Date Noted   Anemia 02/08/2021   Symptomatic anemia 02/07/2021   Nonspecific abnormal electrocardiogram (ECG) (EKG)    Dyspnea 12/18/2020   Abnormal gait 05/18/2020   Body mass index (BMI) 35.0-35.9, adult 05/18/2020   Cardiac pacemaker in situ 05/18/2020   Diabetic nephropathy ([place]) 05/18/2020   IgM monoclonal gammopathy of uncertain significance 05/18/2020   Malignant hypertensive chronic kidney disease 05/18/2020   Morbid obesity ([place]) 05/18/2020   Thrombophilia ([place]) 05/18/2020   Secondary hypercoagulable state ([place]) 08/16/2019   Osteoporosis 05/04/2019   Hypercalcemia 11/04/2018   History of stroke 11/04/2018   Paroxysmal atrial fibrillation ([place]) 11/04/2018   Acute kidney injury ([place])    Acute metabolic encephalopathy 77/41/2878   Personal history of DVT and pulm embolus 10/26/2018   Personal history of other venous thrombosis and embolism 10/26/2018   UTI (urinary tract infection) 10/24/2018   Clavicle fracture 09/26/2018   Degeneration of lumbar intervertebral disc 10/01/2017   Blurry vision, bilateral    Acute blood loss anemia    History of breast cancer 05/30/2017   Physical debility 05/30/2017   Occipital infarction ([place]) 05/30/2017   Pressure injury of skin 05/28/2017   Fall at home 05/27/2017   CKD (chronic kidney disease), stage IV ([place]) 05/27/2017   High cholesterol 05/27/2017   Type 2 diabetes mellitus with stage 4 chronic kidney disease ([place]) 05/27/2017   Hypertension associated with diabetes ([place]) 05/27/2017    Gout 05/27/2017   Elevated troponin 05/27/2017   Lumbar radiculopathy 05/15/2017   Pain of right calf 04/30/2017   Asymmetrical left sensorineural hearing loss 02/14/2016   Impacted cerumen of left ear 02/14/2016   Lipoma of lower extremity 09/12/2014   Abnormal x-ray 03/15/2014   Chronic diastolic congestive heart failure ([place]) 02/23/2014   Candidiasis of skin 01/13/2014   Osteopenia 11/30/2013   Hypoglycemia 09/13/2013   Acute respiratory failure with hypoxia ([place]) 06/14/2013   History of pulmonary embolism 06/10/2013   Nausea and vomiting 06/10/2013   Accelerated hypertension 06/10/2013   Candidiasis of vagina 05/19/2013   Aortic stenosis 04/22/2013   Edema leg 04/22/2013   Breast cancer of upper-outer quadrant of right female breast ([place]); in remission 03/19/2013   Current Outpatient Medications on File Prior to Visit  Medication Sig Dispense Refill   acetaminophen (TYLENOL) 500 MG tablet Take 500 mg by mouth daily as needed (for foot pain).     albuterol (VENTOLIN HFA) 108 (90 Base) MCG/ACT inhaler Inhale 1-2 puffs into the lungs every 6 (six) hours as needed for wheezing or shortness of breath.     allopurinol (ZYLOPRIM) 100 MG tablet Take 100 mg by mouth daily.     benzonatate (TESSALON) 100 MG capsule Take 1 capsule (100 mg total) by mouth every 8 (eight) hours. 21 capsule 0   cholecalciferol (VITAMIN D) 1000 UNITS tablet Take 2,000 Units by mouth daily.     cloNIDine (CATAPRES) 0.2 MG tablet Take 0.2 mg by mouth 2 (two) times daily.     Dextromethorphan-guaiFENesin (MUCINEX DM PO) Take 30 mLs by  mouth daily as needed (cough).     diltiazem (CARDIZEM) 60 MG tablet Take 1 tablet (60 mg total) by mouth every 8 (eight) hours. 90 tablet 0   ELIQUIS 2.5 MG TABS tablet TAKE 1 TABLET BY MOUTH TWICE DAILY. (Patient taking differently: Take 2.5 mg by mouth 2 (two) times daily.) 60 tablet 11   famotidine (PEPCID) 20 MG tablet Take 20 mg by mouth daily.     furosemide (LASIX) 40 MG  tablet Take 40 mg by mouth daily. Per Dr. If over 166lb take 80mg  a day     glipiZIDE (GLUCOTROL) 5 MG tablet Take 5 mg by mouth daily.     hydrALAZINE (APRESOLINE) 50 MG tablet Take 50 mg by mouth 3 (three) times daily.     hydrocortisone cream 1 % Apply 1 application topically daily as needed for itching.     ipratropium-albuterol (DUONEB) 0.5-2.5 (3) MG/3ML SOLN Take 3 mLs by nebulization every 6 (six) hours as needed (wheezing and shortness of breath). 360 mL 0   isosorbide mononitrate (IMDUR) 30 MG 24 hr tablet Take 1 tablet (30 mg total) by mouth daily. 30 tablet 2   mometasone-formoterol (DULERA) 200-5 MCG/ACT AERO Inhale 2 puffs into the lungs 2 (two) times daily. 1 each 0   polyethylene glycol (MIRALAX / GLYCOLAX) packet Take 17 g by mouth daily.     TRUE METRIX BLOOD GLUCOSE TEST test strip      TRUEplus Lancets 28G MISC Apply topically.     No current facility-administered medications on file prior to visit.   Allergies  Allergen Reactions   Beta Adrenergic Blockers Other (See Comments)    fatigue    Objective: General: Patient is awake, alert, and oriented x 3 and in no acute distress.  Integument: Skin is warm, dry and supple bilateral. Nails are tender, long, thickened and dystrophic with subungual debris, consistent with onychomycosis, 1-5 bilateral. No signs of infection. No open lesions or preulcerative lesions present bilateral. Remaining integument unremarkable.  Vasculature:  Dorsalis Pedis pulse 1/4 bilateral. Posterior Tibial pulse  0/4 bilateral. Capillary fill time <5 sec 1-5 bilateral. Scant hair growth to the level of the digits.Temperature gradient within normal limits. No varicosities present bilateral.  1+ pitting edema present bilateral slightly greater on right compared to left.  Neurology: Sensation present via light touch bilateral.   Musculoskeletal:Asymptomatic bunion and hammertoe pedal deformities noted bilateral.  History of right ankle fracture  currently treated by orthopedics.  Assessment and Plan: Problem List Items Addressed This Visit   None Visit Diagnoses     Pain due to onychomycosis of toenails of both feet    -  Primary   PVD (peripheral vascular disease) ([place])       Diabetes mellitus without complication ([place])          -Examined patient. -Re-Discussed and educated patient on diabetic foot care -Mechanically debrided all nails 1-5 bilateral using sterile nail nipper and filed with dremel without incident -Continue with elevation for edema control  -Continue with orthopedic follow-up for right ankle fracture and use of cam boot -Patient to return  in 3 months for at risk foot care -Patient advised to call the office if any problems or questions arise in the meantime.  Landis Martins, DPM

## 2021-03-09 ENCOUNTER — Emergency Department (HOSPITAL_COMMUNITY): Payer: Medicare Other

## 2021-03-09 ENCOUNTER — Encounter (HOSPITAL_COMMUNITY): Payer: Self-pay | Admitting: Emergency Medicine

## 2021-03-09 ENCOUNTER — Other Ambulatory Visit: Payer: Self-pay

## 2021-03-09 ENCOUNTER — Inpatient Hospital Stay (HOSPITAL_COMMUNITY)
Admission: EM | Admit: 2021-03-09 | Discharge: 2021-03-15 | DRG: 291 | Disposition: A | Discharge status: Home Health | Payer: Medicare Other | Attending: Internal Medicine | Admitting: Internal Medicine

## 2021-03-09 DIAGNOSIS — J9621 Acute and chronic respiratory failure with hypoxia: Secondary | ICD-10-CM | POA: Diagnosis present

## 2021-03-09 DIAGNOSIS — F0390 Unspecified dementia without behavioral disturbance: Secondary | ICD-10-CM | POA: Diagnosis not present

## 2021-03-09 DIAGNOSIS — E1122 Type 2 diabetes mellitus with diabetic chronic kidney disease: Secondary | ICD-10-CM | POA: Diagnosis present

## 2021-03-09 DIAGNOSIS — R000 Tachycardia, unspecified: Secondary | ICD-10-CM | POA: Diagnosis not present

## 2021-03-09 DIAGNOSIS — N184 Chronic kidney disease, stage 4 (severe): Secondary | ICD-10-CM | POA: Diagnosis not present

## 2021-03-09 DIAGNOSIS — Z86718 Personal history of other venous thrombosis and embolism: Secondary | ICD-10-CM

## 2021-03-09 DIAGNOSIS — I160 Hypertensive urgency: Secondary | ICD-10-CM | POA: Diagnosis not present

## 2021-03-09 DIAGNOSIS — I10 Essential (primary) hypertension: Secondary | ICD-10-CM | POA: Diagnosis not present

## 2021-03-09 DIAGNOSIS — Z7901 Long term (current) use of anticoagulants: Secondary | ICD-10-CM

## 2021-03-09 DIAGNOSIS — J811 Chronic pulmonary edema: Secondary | ICD-10-CM | POA: Diagnosis not present

## 2021-03-09 DIAGNOSIS — J449 Chronic obstructive pulmonary disease, unspecified: Secondary | ICD-10-CM | POA: Diagnosis present

## 2021-03-09 DIAGNOSIS — E7800 Pure hypercholesterolemia, unspecified: Secondary | ICD-10-CM | POA: Diagnosis present

## 2021-03-09 DIAGNOSIS — R0602 Shortness of breath: Secondary | ICD-10-CM | POA: Diagnosis not present

## 2021-03-09 DIAGNOSIS — D539 Nutritional anemia, unspecified: Secondary | ICD-10-CM | POA: Diagnosis present

## 2021-03-09 DIAGNOSIS — Z853 Personal history of malignant neoplasm of breast: Secondary | ICD-10-CM | POA: Diagnosis not present

## 2021-03-09 DIAGNOSIS — I248 Other forms of acute ischemic heart disease: Secondary | ICD-10-CM | POA: Diagnosis not present

## 2021-03-09 DIAGNOSIS — I480 Paroxysmal atrial fibrillation: Secondary | ICD-10-CM | POA: Diagnosis present

## 2021-03-09 DIAGNOSIS — Z8673 Personal history of transient ischemic attack (TIA), and cerebral infarction without residual deficits: Secondary | ICD-10-CM

## 2021-03-09 DIAGNOSIS — Z923 Personal history of irradiation: Secondary | ICD-10-CM

## 2021-03-09 DIAGNOSIS — Z9981 Dependence on supplemental oxygen: Secondary | ICD-10-CM

## 2021-03-09 DIAGNOSIS — I130 Hypertensive heart and chronic kidney disease with heart failure and stage 1 through stage 4 chronic kidney disease, or unspecified chronic kidney disease: Secondary | ICD-10-CM | POA: Diagnosis not present

## 2021-03-09 DIAGNOSIS — Z803 Family history of malignant neoplasm of breast: Secondary | ICD-10-CM

## 2021-03-09 DIAGNOSIS — Z7951 Long term (current) use of inhaled steroids: Secondary | ICD-10-CM

## 2021-03-09 DIAGNOSIS — D631 Anemia in chronic kidney disease: Secondary | ICD-10-CM | POA: Diagnosis present

## 2021-03-09 DIAGNOSIS — E871 Hypo-osmolality and hyponatremia: Secondary | ICD-10-CM | POA: Diagnosis not present

## 2021-03-09 DIAGNOSIS — I4891 Unspecified atrial fibrillation: Secondary | ICD-10-CM | POA: Diagnosis not present

## 2021-03-09 DIAGNOSIS — Z79899 Other long term (current) drug therapy: Secondary | ICD-10-CM

## 2021-03-09 DIAGNOSIS — I5033 Acute on chronic diastolic (congestive) heart failure: Secondary | ICD-10-CM | POA: Diagnosis not present

## 2021-03-09 DIAGNOSIS — J9811 Atelectasis: Secondary | ICD-10-CM | POA: Diagnosis not present

## 2021-03-09 DIAGNOSIS — Z85828 Personal history of other malignant neoplasm of skin: Secondary | ICD-10-CM

## 2021-03-09 DIAGNOSIS — Z888 Allergy status to other drugs, medicaments and biological substances status: Secondary | ICD-10-CM

## 2021-03-09 DIAGNOSIS — M1990 Unspecified osteoarthritis, unspecified site: Secondary | ICD-10-CM | POA: Diagnosis present

## 2021-03-09 DIAGNOSIS — M109 Gout, unspecified: Secondary | ICD-10-CM | POA: Diagnosis present

## 2021-03-09 DIAGNOSIS — Z20822 Contact with and (suspected) exposure to covid-19: Secondary | ICD-10-CM | POA: Diagnosis present

## 2021-03-09 DIAGNOSIS — Z66 Do not resuscitate: Secondary | ICD-10-CM | POA: Diagnosis present

## 2021-03-09 DIAGNOSIS — Z9221 Personal history of antineoplastic chemotherapy: Secondary | ICD-10-CM

## 2021-03-09 DIAGNOSIS — R778 Other specified abnormalities of plasma proteins: Secondary | ICD-10-CM | POA: Diagnosis not present

## 2021-03-09 DIAGNOSIS — Z86711 Personal history of pulmonary embolism: Secondary | ICD-10-CM | POA: Diagnosis not present

## 2021-03-09 DIAGNOSIS — J441 Chronic obstructive pulmonary disease with (acute) exacerbation: Secondary | ICD-10-CM | POA: Diagnosis present

## 2021-03-09 DIAGNOSIS — J90 Pleural effusion, not elsewhere classified: Secondary | ICD-10-CM | POA: Diagnosis not present

## 2021-03-09 DIAGNOSIS — R911 Solitary pulmonary nodule: Secondary | ICD-10-CM | POA: Diagnosis not present

## 2021-03-09 DIAGNOSIS — Z7984 Long term (current) use of oral hypoglycemic drugs: Secondary | ICD-10-CM

## 2021-03-09 LAB — CBC WITH DIFFERENTIAL/PLATELET
Abs Immature Granulocytes: 0.03 10*3/uL (ref 0.00–0.07)
Basophils Absolute: 0 10*3/uL (ref 0.0–0.1)
Basophils Relative: 0 %
Eosinophils Absolute: 0.7 10*3/uL — ABNORMAL HIGH (ref 0.0–0.5)
Eosinophils Relative: 7 %
HCT: 29.5 % — ABNORMAL LOW (ref 36.0–46.0)
Hemoglobin: 9.2 g/dL — ABNORMAL LOW (ref 12.0–15.0)
Immature Granulocytes: 0 %
Lymphocytes Relative: 30 %
Lymphs Abs: 2.9 10*3/uL (ref 0.7–4.0)
MCH: 31.4 pg (ref 26.0–34.0)
MCHC: 31.2 g/dL (ref 30.0–36.0)
MCV: 100.7 fL — ABNORMAL HIGH (ref 80.0–100.0)
Monocytes Absolute: 0.7 10*3/uL (ref 0.1–1.0)
Monocytes Relative: 7 %
Neutro Abs: 5.5 10*3/uL (ref 1.7–7.7)
Neutrophils Relative %: 56 %
Platelets: 279 10*3/uL (ref 150–400)
RBC: 2.93 MIL/uL — ABNORMAL LOW (ref 3.87–5.11)
RDW: 15.3 % (ref 11.5–15.5)
WBC: 9.8 10*3/uL (ref 4.0–10.5)
nRBC: 0 % (ref 0.0–0.2)

## 2021-03-09 LAB — BASIC METABOLIC PANEL
Anion gap: 12 (ref 5–15)
BUN: 68 mg/dL — ABNORMAL HIGH (ref 8–23)
CO2: 29 mmol/L (ref 22–32)
Calcium: 11 mg/dL — ABNORMAL HIGH (ref 8.9–10.3)
Chloride: 91 mmol/L — ABNORMAL LOW (ref 98–111)
Creatinine, Ser: 2.53 mg/dL — ABNORMAL HIGH (ref 0.44–1.00)
GFR, Estimated: 17 mL/min — ABNORMAL LOW (ref >60)
Glucose, Bld: 291 mg/dL — ABNORMAL HIGH (ref 70–99)
Potassium: 3.7 mmol/L (ref 3.5–5.1)
Sodium: 132 mmol/L — ABNORMAL LOW (ref 135–145)

## 2021-03-09 LAB — RESP PANEL BY RT-PCR (FLU A&B, COVID) ARPGX2
Influenza A by PCR: NEGATIVE
Influenza B by PCR: NEGATIVE
SARS Coronavirus 2 by RT PCR: NEGATIVE

## 2021-03-09 LAB — TROPONIN I (HIGH SENSITIVITY): Troponin I (High Sensitivity): 27 ng/L — ABNORMAL HIGH (ref <18)

## 2021-03-09 LAB — BRAIN NATRIURETIC PEPTIDE: B Natriuretic Peptide: 426.7 pg/mL — ABNORMAL HIGH (ref 0.0–100.0)

## 2021-03-09 MED ORDER — DILTIAZEM HCL 25 MG/5ML IV SOLN
10.0000 mg | Freq: Once | INTRAVENOUS | Status: AC
Start: 2021-03-09 — End: 2021-03-09
  Administered 2021-03-09: 10 mg via INTRAVENOUS
  Filled 2021-03-09: qty 5

## 2021-03-09 MED ORDER — ALBUTEROL SULFATE (2.5 MG/3ML) 0.083% IN NEBU
5.0000 mg | INHALATION_SOLUTION | Freq: Once | RESPIRATORY_TRACT | Status: AC
  Administered 2021-03-09: 5 mg via RESPIRATORY_TRACT
  Filled 2021-03-09: qty 6

## 2021-03-09 MED ORDER — IPRATROPIUM BROMIDE 0.02 % IN SOLN
0.5000 mg | Freq: Once | RESPIRATORY_TRACT | Status: AC
Start: 2021-03-09 — End: 2021-03-09
  Administered 2021-03-09: 0.5 mg via RESPIRATORY_TRACT
  Filled 2021-03-09: qty 2.5

## 2021-03-09 MED ORDER — METHYLPREDNISOLONE SODIUM SUCC 125 MG IJ SOLR
125.0000 mg | Freq: Once | INTRAMUSCULAR | Status: AC
Start: 2021-03-09 — End: 2021-03-09
  Administered 2021-03-09: 125 mg via INTRAVENOUS
  Filled 2021-03-09: qty 2

## 2021-03-09 MED ORDER — MAGNESIUM SULFATE 2 GM/50ML IV SOLN
2.0000 g | Freq: Once | INTRAVENOUS | Status: AC
  Administered 2021-03-09: 2 g via INTRAVENOUS
  Filled 2021-03-09: qty 50

## 2021-03-09 MED ORDER — NITROGLYCERIN IN D5W 200-5 MCG/ML-% IV SOLN
0.0000 ug/min | INTRAVENOUS | Status: DC
  Administered 2021-03-09: 5 ug/min via INTRAVENOUS
  Filled 2021-03-09: qty 250

## 2021-03-09 NOTE — ED Triage Notes (Signed)
BIB [facility] from home for SOB. Recently seen for same. She was dx'd with bronchitis. Today, caretaker called because she was coughing up green stuff and SOB. She received a duoneb en route.

## 2021-03-09 NOTE — ED Provider Notes (Signed)
[facility] DEPT Provider Note   CSN: [id number] Arrival date & time: 03/09/21  2047     History  Chief Complaint  Patient presents with   Shortness of Sabrina [name] is a 89 y.o. female.  Pt is a 88y/o female with hx of paroxysmal atrial fibrillation, diastolic heart failure, CKD, CVA, PE/DVT on eliquis, DM, HTN, chronic bronchitis on home inhalers who is presenting today from home due to worsening shortness of breath and work of breathing.  Patient is on 3 L chronically at home but her home nurse who was there today who has not seen her in a while noted she was having green sputum, increased work of breathing and a lot of wheezing.  They report that she has difficult time coordinating doing the inhalers at home and they do not think she gets much of the medication.  Currently the patient does not have significant complaints.  She reports she feels a little bit short of breath and does note that she is wheezing but denies fever or pain.  She has no nausea or vomiting.  EMS reported when they arrived she was satting 96% on her 3 L but did appear to have an increased work of breathing.  She did receive a DuoNeb on the way to the hospital.  They do not think it changed her wheezing much.  She has not received any other medication at this time.  In the past patient did have a MOST form that reported that she was comfort measures but it has been since 2020 since she had it done.  The history is provided by the patient.  Shortness of Breath     Home Medications Prior to Admission medications   Medication Sig Start Date End Date Taking? Authorizing Provider  acetaminophen (TYLENOL) 500 MG tablet Take 500 mg by mouth daily as needed (for foot pain).    [provider]  albuterol (VENTOLIN HFA) 108 (90 Base) MCG/ACT inhaler Inhale 1-2 puffs into the lungs every 6 (six) hours as needed for wheezing or shortness of breath. 03/22/19   [provider]  allopurinol (ZYLOPRIM) 100 MG tablet Take 100 mg by mouth daily. 01/17/16   [provider]  benzonatate (TESSALON) 100 MG capsule Take 1 capsule (100 mg total) by mouth every 8 (eight) hours. 03/04/21   Domenic Moras, PA-C  cholecalciferol (VITAMIN D) 1000 UNITS tablet Take 2,000 Units by mouth daily.    [provider]  cloNIDine (CATAPRES) 0.2 MG tablet Take 0.2 mg by mouth 2 (two) times daily.    [provider]  Dextromethorphan-guaiFENesin (MUCINEX DM PO) Take 30 mLs by mouth daily as needed (cough).    [provider]  diltiazem (CARDIZEM) 60 MG tablet Take 1 tablet (60 mg total) by mouth every 8 (eight) hours. 02/13/21 03/15/21  Arrien, Jimmy Picket, MD  ELIQUIS 2.5 MG TABS tablet TAKE 1 TABLET BY MOUTH TWICE DAILY. Patient taking differently: Take 2.5 mg by mouth 2 (two) times daily. 07/04/20   Fenton, Clint R, PA  famotidine (PEPCID) 20 MG tablet Take 20 mg by mouth daily. 11/09/19   [provider]  furosemide (LASIX) 40 MG tablet Take 40 mg by mouth daily. Per Dr. If over 166lb take 80mg  a day    [provider]  glipiZIDE (GLUCOTROL) 5 MG tablet Take 5 mg by mouth daily.    [provider]  hydrALAZINE (APRESOLINE) 50 MG tablet Take 50 mg by  mouth 3 (three) times daily.    [provider]  hydrocortisone cream 1 % Apply 1 application topically daily as needed for itching.    [provider]  ipratropium-albuterol (DUONEB) 0.5-2.5 (3) MG/3ML SOLN Take 3 mLs by nebulization every 6 (six) hours as needed (wheezing and shortness of breath). 02/13/21   Arrien, Jimmy Picket, MD  isosorbide mononitrate (IMDUR) 30 MG 24 hr tablet Take 1 tablet (30 mg total) by mouth daily. 12/22/20 02/07/22  Pokhrel, Corrie Mckusick, MD  mometasone-formoterol (DULERA) 200-5 MCG/ACT AERO Inhale 2 puffs into the lungs 2 (two) times daily. 02/13/21   Arrien, Jimmy Picket, MD  polyethylene glycol [facility] / Floria Raveling) packet Take  17 g by mouth daily.    [provider]  TRUE METRIX BLOOD GLUCOSE TEST test strip  07/19/19   [provider]  TRUEplus Lancets 28G MISC Apply topically. 08/20/19   [provider]      Allergies    Beta adrenergic blockers    Review of Systems   Review of Systems  Respiratory:  Positive for shortness of breath.    Physical Exam Updated Vital Signs BP (!) 248/97    Pulse (!) 133    Temp 98.1 F (36.7 C) (Oral)    Resp (!) 30    SpO2 97%  Physical Exam Vitals and nursing note reviewed.  Constitutional:      General: She is not in acute distress.    Appearance: She is well-developed.  HENT:     Head: Normocephalic and atraumatic.     Mouth/Throat:     Mouth: Mucous membranes are moist.  Eyes:     Pupils: Pupils are equal, round, and reactive to light.  Cardiovascular:     Rate and Rhythm: Regular rhythm. Tachycardia present.     Heart sounds: Normal heart sounds. No murmur heard.   No friction rub.  Pulmonary:     Effort: Pulmonary effort is normal. Tachypnea present.     Breath sounds: Wheezing present. No rales.  Abdominal:     General: Bowel sounds are normal. There is no distension.     Palpations: Abdomen is soft.     Tenderness: There is no abdominal tenderness. There is no guarding or rebound.  Musculoskeletal:        General: No tenderness. Normal range of motion.     Cervical back: Normal range of motion and neck supple.     Right lower leg: Edema present.     Left lower leg: Edema present.     Comments: 1+ pitting edema in bilateral lower ext.  Right leg in cam walker  Skin:    General: Skin is warm and dry.     Findings: No rash.  Neurological:     Mental Status: She is alert and oriented to person, place, and time. Mental status is at baseline.     Cranial Nerves: No cranial nerve deficit.  Psychiatric:        Mood and Affect: Mood normal.        Behavior: Behavior normal.    ED Results / Procedures / Treatments   Labs (all  labs ordered are listed, but only abnormal results are displayed) Labs Reviewed  CBC WITH DIFFERENTIAL/PLATELET - Abnormal; Notable for the following components:      Result Value   RBC 2.93 (*)    Hemoglobin 9.2 (*)    HCT 29.5 (*)    MCV 100.7 (*)    Eosinophils Absolute 0.7 (*)  All other components within normal limits  BASIC METABOLIC PANEL - Abnormal; Notable for the following components:   Sodium 132 (*)    Chloride 91 (*)    Glucose, Bld 291 (*)    BUN 68 (*)    Creatinine, Ser 2.53 (*)    Calcium 11.0 (*)    GFR, Estimated 17 (*)    All other components within normal limits  BRAIN NATRIURETIC PEPTIDE - Abnormal; Notable for the following components:   B Natriuretic Peptide 426.7 (*)    All other components within normal limits  TROPONIN I (HIGH SENSITIVITY) - Abnormal; Notable for the following components:   Troponin I (High Sensitivity) 27 (*)    All other components within normal limits  RESP PANEL BY RT-PCR (FLU A&B, COVID) ARPGX2  TROPONIN I (HIGH SENSITIVITY)    EKG EKG Interpretation  Date/Time:  Friday March 09 2021 21:37:00 EST Ventricular Rate:  99 PR Interval:    QRS Duration: 86 QT Interval:  345 QTC Calculation: 443 R Axis:   70 Text Interpretation: Atrial fibrillation Anteroseptal infarct, old Repol abnrm, severe global ischemia (LM/MVD) No significant change since last tracing Confirmed by Blanchie Dessert [phone]) on 03/09/2021 9:42:24 PM  Radiology DG Chest Port 1 View  Result Date: 03/09/2021 CLINICAL DATA:  Shortness of breath. EXAM: PORTABLE CHEST 1 VIEW COMPARISON:  Radiograph 5 days ago 03/04/2021 chest CT 02/11/2021 FINDINGS: Patient's chin obscures the apices.The cardiomediastinal contours are unchanged. Some of the previous question left pulmonary nodules are again seen, some are obscured on the current exam. Planted loop recorder in the left chest wall. Pulmonary vasculature is normal. No consolidation, pleural effusion, or pneumothorax.  No acute osseous abnormalities are seen. Surgical clips in the right axilla. IMPRESSION: 1. No acute abnormality. 2. Some of the previously questioned tiny left pulmonary nodules are again seen. Electronically Signed   By: Keith Rake M.D.   On: 03/09/2021 22:58    Procedures Procedures    Medications Ordered in ED Medications  methylPREDNISolone sodium succinate (SOLU-MEDROL) 125 mg/2 mL injection 125 mg (125 mg Intravenous Given 03/09/21 2129)  magnesium sulfate IVPB 2 g 50 mL (0 g Intravenous Stopped 03/09/21 2257)  albuterol (PROVENTIL) (2.5 MG/3ML) 0.083% nebulizer solution 5 mg (5 mg Nebulization Given 03/09/21 2207)  ipratropium (ATROVENT) nebulizer solution 0.5 mg (0.5 mg Nebulization Given 03/09/21 2208)  diltiazem (CARDIZEM) injection 10 mg (10 mg Intravenous Given 03/09/21 2320)    ED Course/ Medical Decision Making/ A&P                           Medical Decision Making Amount and/or Complexity of Data Reviewed Independent Historian: caregiver External Data Reviewed: radiology, ECG and notes. Labs: ordered. Radiology: ordered and independent interpretation performed. ECG/medicine tests: ordered and independent interpretation performed.  Risk Decision regarding hospitalization.   Patient is an elderly female with multiple medical problems presenting today with worsening wheezing and shortness of breath.  Patient has a history of chronic bronchitis with wheezing but is not using her inhalers well at home.  She does have increased productive cough per her health aides at home.  She also has a history of CHF and paroxysmal atrial fibrillation and PE but is currently anticoagulated.  She does have mild evidence of fluid overload today.  Patient was recently hospitalized for chronic bronchitis and respiratory distress.  She was discharged on 02/13/2021.  Patient is able to speak in short sentences.  She has complaints of  mild shortness of breath but no other acute complaints.  Patient  does have a prior MOST form that reports she wants to be comfort care however it was done in 2020 and never reevaluated.  When asking the patient today if she would want treatment for her breathing or antibiotics if she had pneumonia she reported she would want them if it would help her.  We will get chest x-ray, labs.  Patient given Solu-Medrol and magnesium.  Heart rate 150 initially which may be from recent DuoNeb and possible A. fib we will get an EKG before she is given any more beta agonist.  11:33 PM Patient's COVID and flu are negative.  CBC with stable hemoglobin of 9.2 and normal white count, BMP with persistent elevated creatinine of 2.53 and elevated BUN of 68 which is not far off her baseline.  BNP today elevated at 426 from the last 1 that was in the 200s.  Initial troponin is 27.  I reviewed and interpreted independently all these labs.  I independently interpreted the chest x-ray which showed no evidence of pulmonary edema.  After the nebulizer patient became slightly worse some much more tachycardic and hypertensive.  May be from the albuterol.  Patient was given IV Cardizem and will reevaluate patient's pressure and heart rate after this.  Patient will need admission for COPD exacerbation and possible hypertensive urgency. Patient checked out to Dr. Ayesha Rumpf who will reevaluate  CRITICAL CARE Performed by: Kirubel Aja Total critical care time: 30 minutes Critical care time was exclusive of separately billable procedures and treating other patients. Critical care was necessary to treat or prevent imminent or life-threatening deterioration. Critical care was time spent personally by me on the following activities: development of treatment plan with patient and/or surrogate as well as nursing, discussions with consultants, evaluation of patient's response to treatment, examination of patient, obtaining history from patient or surrogate, ordering and performing treatments and  interventions, ordering and review of laboratory studies, ordering and review of radiographic studies, pulse oximetry and re-evaluation of patient's condition.        Final Clinical Impression(s) / ED Diagnoses Final diagnoses:  COPD exacerbation ([place])  Atrial fibrillation with RVR ([place])  Hypertensive urgency    Rx / DC Orders ED Discharge Orders     None         Blanchie Dessert, MD 03/09/21 2338

## 2021-03-09 NOTE — ED Provider Notes (Signed)
Pt care assumed at 2330.  Patient with history of COPD on home oxygen here for evaluation of progressive shortness of breath.  Patient care assumed pending reassessment after carvedilol.  Patient developed severe hypertension, tachycardia after albuterol received a dose of carvedilol.  Carvedilol converted her to sinus tachycardia.  She had persistent wheezing, shortness of breath.  She was treated with nitroglycerin for accelerated hypertension.  Symptoms significantly improved after nitroglycerin administration with improvement in her work of breathing as well as improvement in her blood pressure.  Blood pressures normalized and she was able to be discontinued off the nitro drip.  Medicine consulted for admission for hypertensive urgency, COPD exacerbation.  CRITICAL CARE Performed by: Quintella Reichert   Total critical care time: 35 minutes  Critical care time was exclusive of separately billable procedures and treating other patients.  Critical care was necessary to treat or prevent imminent or life-threatening deterioration.  Critical care was time spent personally by me on the following activities: development of treatment plan with patient and/or surrogate as well as nursing, discussions with consultants, evaluation of patient's response to treatment, examination of patient, obtaining history from patient or surrogate, ordering and performing treatments and interventions, ordering and review of laboratory studies, ordering and review of radiographic studies, pulse oximetry and re-evaluation of patient's condition.    Quintella Reichert, MD 03/10/21 [id number]

## 2021-03-10 ENCOUNTER — Inpatient Hospital Stay (HOSPITAL_COMMUNITY): Payer: Medicare Other

## 2021-03-10 DIAGNOSIS — I248 Other forms of acute ischemic heart disease: Secondary | ICD-10-CM | POA: Diagnosis present

## 2021-03-10 DIAGNOSIS — M109 Gout, unspecified: Secondary | ICD-10-CM | POA: Diagnosis present

## 2021-03-10 DIAGNOSIS — E7800 Pure hypercholesterolemia, unspecified: Secondary | ICD-10-CM | POA: Diagnosis present

## 2021-03-10 DIAGNOSIS — E871 Hypo-osmolality and hyponatremia: Secondary | ICD-10-CM | POA: Diagnosis present

## 2021-03-10 DIAGNOSIS — D539 Nutritional anemia, unspecified: Secondary | ICD-10-CM | POA: Diagnosis present

## 2021-03-10 DIAGNOSIS — R778 Other specified abnormalities of plasma proteins: Secondary | ICD-10-CM | POA: Diagnosis not present

## 2021-03-10 DIAGNOSIS — Z9221 Personal history of antineoplastic chemotherapy: Secondary | ICD-10-CM | POA: Diagnosis not present

## 2021-03-10 DIAGNOSIS — I160 Hypertensive urgency: Secondary | ICD-10-CM | POA: Diagnosis present

## 2021-03-10 DIAGNOSIS — F0390 Unspecified dementia without behavioral disturbance: Secondary | ICD-10-CM | POA: Diagnosis present

## 2021-03-10 DIAGNOSIS — Z85828 Personal history of other malignant neoplasm of skin: Secondary | ICD-10-CM | POA: Diagnosis not present

## 2021-03-10 DIAGNOSIS — R0602 Shortness of breath: Secondary | ICD-10-CM

## 2021-03-10 DIAGNOSIS — N184 Chronic kidney disease, stage 4 (severe): Secondary | ICD-10-CM | POA: Diagnosis present

## 2021-03-10 DIAGNOSIS — I4891 Unspecified atrial fibrillation: Secondary | ICD-10-CM | POA: Diagnosis present

## 2021-03-10 DIAGNOSIS — Z66 Do not resuscitate: Secondary | ICD-10-CM | POA: Diagnosis present

## 2021-03-10 DIAGNOSIS — M1990 Unspecified osteoarthritis, unspecified site: Secondary | ICD-10-CM | POA: Diagnosis present

## 2021-03-10 DIAGNOSIS — Z853 Personal history of malignant neoplasm of breast: Secondary | ICD-10-CM | POA: Diagnosis not present

## 2021-03-10 DIAGNOSIS — J441 Chronic obstructive pulmonary disease with (acute) exacerbation: Principal | ICD-10-CM | POA: Diagnosis present

## 2021-03-10 DIAGNOSIS — Z20822 Contact with and (suspected) exposure to covid-19: Secondary | ICD-10-CM | POA: Diagnosis present

## 2021-03-10 DIAGNOSIS — I480 Paroxysmal atrial fibrillation: Secondary | ICD-10-CM | POA: Diagnosis present

## 2021-03-10 DIAGNOSIS — J9621 Acute and chronic respiratory failure with hypoxia: Secondary | ICD-10-CM | POA: Diagnosis present

## 2021-03-10 DIAGNOSIS — E1122 Type 2 diabetes mellitus with diabetic chronic kidney disease: Secondary | ICD-10-CM | POA: Diagnosis present

## 2021-03-10 DIAGNOSIS — Z8673 Personal history of transient ischemic attack (TIA), and cerebral infarction without residual deficits: Secondary | ICD-10-CM | POA: Diagnosis not present

## 2021-03-10 DIAGNOSIS — I5033 Acute on chronic diastolic (congestive) heart failure: Secondary | ICD-10-CM | POA: Diagnosis present

## 2021-03-10 DIAGNOSIS — Z86711 Personal history of pulmonary embolism: Secondary | ICD-10-CM | POA: Diagnosis not present

## 2021-03-10 DIAGNOSIS — D631 Anemia in chronic kidney disease: Secondary | ICD-10-CM | POA: Diagnosis present

## 2021-03-10 DIAGNOSIS — I130 Hypertensive heart and chronic kidney disease with heart failure and stage 1 through stage 4 chronic kidney disease, or unspecified chronic kidney disease: Secondary | ICD-10-CM | POA: Diagnosis present

## 2021-03-10 DIAGNOSIS — J449 Chronic obstructive pulmonary disease, unspecified: Secondary | ICD-10-CM | POA: Diagnosis present

## 2021-03-10 LAB — RESPIRATORY PANEL BY PCR

## 2021-03-10 LAB — CBC WITH DIFFERENTIAL/PLATELET
Abs Immature Granulocytes: 0.05 10*3/uL (ref 0.00–0.07)
Basophils Absolute: 0 10*3/uL (ref 0.0–0.1)
Basophils Relative: 0 %
Eosinophils Absolute: 0 10*3/uL (ref 0.0–0.5)
Eosinophils Relative: 0 %
HCT: 28.4 % — ABNORMAL LOW (ref 36.0–46.0)
Hemoglobin: 9.3 g/dL — ABNORMAL LOW (ref 12.0–15.0)
Immature Granulocytes: 1 %
Lymphocytes Relative: 13 %
Lymphs Abs: 1.4 10*3/uL (ref 0.7–4.0)
MCH: 32.7 pg (ref 26.0–34.0)
MCHC: 32.7 g/dL (ref 30.0–36.0)
MCV: 100 fL (ref 80.0–100.0)
Monocytes Absolute: 0.1 10*3/uL (ref 0.1–1.0)
Monocytes Relative: 1 %
Neutro Abs: 8.6 10*3/uL — ABNORMAL HIGH (ref 1.7–7.7)
Neutrophils Relative %: 85 %
Platelets: 285 10*3/uL (ref 150–400)
RBC: 2.84 MIL/uL — ABNORMAL LOW (ref 3.87–5.11)
RDW: 15.3 % (ref 11.5–15.5)
WBC: 10.1 10*3/uL (ref 4.0–10.5)
nRBC: 0 % (ref 0.0–0.2)

## 2021-03-10 LAB — COMPREHENSIVE METABOLIC PANEL
ALT: 17 U/L (ref 0–44)
AST: 17 U/L (ref 15–41)
Albumin: 3.7 g/dL (ref 3.5–5.0)
Alkaline Phosphatase: 92 U/L (ref 38–126)
Anion gap: 15 (ref 5–15)
BUN: 72 mg/dL — ABNORMAL HIGH (ref 8–23)
CO2: 27 mmol/L (ref 22–32)
Calcium: 11.2 mg/dL — ABNORMAL HIGH (ref 8.9–10.3)
Chloride: 90 mmol/L — ABNORMAL LOW (ref 98–111)
Creatinine, Ser: 2.62 mg/dL — ABNORMAL HIGH (ref 0.44–1.00)
GFR, Estimated: 17 mL/min — ABNORMAL LOW (ref >60)
Glucose, Bld: 475 mg/dL — ABNORMAL HIGH (ref 70–99)
Potassium: 4.3 mmol/L (ref 3.5–5.1)
Sodium: 132 mmol/L — ABNORMAL LOW (ref 135–145)
Total Bilirubin: 1.1 mg/dL (ref 0.3–1.2)
Total Protein: 7.3 g/dL (ref 6.5–8.1)

## 2021-03-10 LAB — ECHOCARDIOGRAM COMPLETE
AR max vel: 0.93 cm2
AV Area VTI: 1.05 cm2
AV Area mean vel: 0.89 cm2
AV Mean grad: 22 mmHg
AV Peak grad: 39.9 mmHg
Ao pk vel: 3.16 m/s
Area-P 1/2: 3.7 cm2
Height: 59 in
MV VTI: 1.51 cm2
Weight: 2617.3 oz

## 2021-03-10 LAB — GLUCOSE, CAPILLARY
Glucose-Capillary: 428 mg/dL — ABNORMAL HIGH (ref 70–99)
Glucose-Capillary: 379 mg/dL — ABNORMAL HIGH (ref 70–99)
Glucose-Capillary: 326 mg/dL — ABNORMAL HIGH (ref 70–99)
Glucose-Capillary: 280 mg/dL — ABNORMAL HIGH (ref 70–99)

## 2021-03-10 LAB — HEMOGLOBIN A1C
Hgb A1c MFr Bld: 8.8 % — ABNORMAL HIGH (ref 4.8–5.6)
Mean Plasma Glucose: 205.86 mg/dL

## 2021-03-10 LAB — TROPONIN I (HIGH SENSITIVITY)
Troponin I (High Sensitivity): 58 ng/L — ABNORMAL HIGH (ref <18)
Troponin I (High Sensitivity): 143 ng/L (ref <18)
Troponin I (High Sensitivity): 169 ng/L (ref <18)

## 2021-03-10 LAB — D-DIMER, QUANTITATIVE: D-Dimer, Quant: 0.27 ug/mL-FEU (ref 0.00–0.50)

## 2021-03-10 MED ORDER — DILTIAZEM HCL 60 MG PO TABS
60.0000 mg | ORAL_TABLET | Freq: Three times a day (TID) | ORAL | Status: DC
  Administered 2021-03-10: 60 mg via ORAL
  Filled 2021-03-10: qty 1

## 2021-03-10 MED ORDER — ISOSORBIDE MONONITRATE ER 30 MG PO TB24
30.0000 mg | ORAL_TABLET | Freq: Every day | ORAL | Status: DC
  Administered 2021-03-10 – 2021-03-15 (×6): 30 mg via ORAL
  Filled 2021-03-10 (×6): qty 1

## 2021-03-10 MED ORDER — VITAMIN D 25 MCG (1000 UNIT) PO TABS
1000.0000 [IU] | ORAL_TABLET | Freq: Every day | ORAL | Status: DC
  Administered 2021-03-10 – 2021-03-15 (×6): 1000 [IU] via ORAL
  Filled 2021-03-10 (×7): qty 1

## 2021-03-10 MED ORDER — DILTIAZEM HCL 60 MG PO TABS
60.0000 mg | ORAL_TABLET | Freq: Three times a day (TID) | ORAL | Status: DC
  Administered 2021-03-10 – 2021-03-15 (×14): 60 mg via ORAL
  Filled 2021-03-10 (×13): qty 1

## 2021-03-10 MED ORDER — NICARDIPINE HCL IN NACL 20-0.86 MG/200ML-% IV SOLN
3.0000 mg/h | INTRAVENOUS | Status: DC
  Administered 2021-03-10: 5 mg/h via INTRAVENOUS
  Filled 2021-03-10: qty 200

## 2021-03-10 MED ORDER — FAMOTIDINE 20 MG PO TABS: 20.0000 mg | ORAL_TABLET | Freq: Every evening | ORAL | Status: DC | PRN

## 2021-03-10 MED ORDER — LABETALOL HCL 5 MG/ML IV SOLN
10.0000 mg | INTRAVENOUS | Status: DC | PRN
  Administered 2021-03-10 – 2021-03-14 (×5): 10 mg via INTRAVENOUS
  Filled 2021-03-10 (×5): qty 4

## 2021-03-10 MED ORDER — CLONIDINE HCL 0.2 MG PO TABS
0.2000 mg | ORAL_TABLET | Freq: Two times a day (BID) | ORAL | Status: DC
  Administered 2021-03-10 – 2021-03-15 (×12): 0.2 mg via ORAL
  Filled 2021-03-10 (×3): qty 2
  Filled 2021-03-10: qty 1
  Filled 2021-03-10 (×2): qty 2
  Filled 2021-03-10 (×2): qty 1
  Filled 2021-03-10 (×4): qty 2

## 2021-03-10 MED ORDER — IPRATROPIUM-ALBUTEROL 0.5-2.5 (3) MG/3ML IN SOLN: 3.0000 mL | Freq: Four times a day (QID) | RESPIRATORY_TRACT | Status: DC | PRN

## 2021-03-10 MED ORDER — INSULIN ASPART 100 UNIT/ML IJ SOLN
0.0000 [IU] | Freq: Three times a day (TID) | INTRAMUSCULAR | Status: DC
  Administered 2021-03-10: 7 [IU] via SUBCUTANEOUS
  Administered 2021-03-10: 9 [IU] via SUBCUTANEOUS
  Administered 2021-03-10 – 2021-03-11 (×2): 7 [IU] via SUBCUTANEOUS
  Administered 2021-03-11: 5 [IU] via SUBCUTANEOUS
  Administered 2021-03-11: 7 [IU] via SUBCUTANEOUS
  Administered 2021-03-12: 3 [IU] via SUBCUTANEOUS
  Administered 2021-03-12: 5 [IU] via SUBCUTANEOUS
  Administered 2021-03-12 – 2021-03-13 (×3): 7 [IU] via SUBCUTANEOUS
  Administered 2021-03-13: 3 [IU] via SUBCUTANEOUS
  Administered 2021-03-14: 9 [IU] via SUBCUTANEOUS
  Administered 2021-03-14: 2 [IU] via SUBCUTANEOUS
  Administered 2021-03-15: 5 [IU] via SUBCUTANEOUS
  Administered 2021-03-15: 9 [IU] via SUBCUTANEOUS
  Administered 2021-03-15: 2 [IU] via SUBCUTANEOUS

## 2021-03-10 MED ORDER — GABAPENTIN 100 MG PO CAPS: 200.0000 mg | ORAL_CAPSULE | ORAL | Status: DC

## 2021-03-10 MED ORDER — ACETAMINOPHEN 325 MG PO TABS
650.0000 mg | ORAL_TABLET | Freq: Four times a day (QID) | ORAL | Status: DC | PRN
  Administered 2021-03-11: 650 mg via ORAL
  Filled 2021-03-10: qty 2

## 2021-03-10 MED ORDER — ORAL CARE MOUTH RINSE
15.0000 mL | Freq: Two times a day (BID) | OROMUCOSAL | Status: DC
  Administered 2021-03-10 – 2021-03-11 (×3): 15 mL via OROMUCOSAL

## 2021-03-10 MED ORDER — DOXYCYCLINE HYCLATE 100 MG PO TABS: 100.0000 mg | ORAL_TABLET | Freq: Two times a day (BID) | ORAL | Status: DC

## 2021-03-10 MED ORDER — PREDNISONE 20 MG PO TABS
40.0000 mg | ORAL_TABLET | Freq: Every day | ORAL | Status: AC
  Administered 2021-03-11 – 2021-03-15 (×5): 40 mg via ORAL
  Filled 2021-03-10 (×5): qty 2

## 2021-03-10 MED ORDER — MOMETASONE FURO-FORMOTEROL FUM 200-5 MCG/ACT IN AERO
2.0000 | INHALATION_SPRAY | Freq: Two times a day (BID) | RESPIRATORY_TRACT | Status: DC
  Administered 2021-03-10 – 2021-03-15 (×10): 2 via RESPIRATORY_TRACT
  Filled 2021-03-10 (×2): qty 8.8

## 2021-03-10 MED ORDER — AMLODIPINE BESYLATE 5 MG PO TABS
2.5000 mg | ORAL_TABLET | Freq: Every day | ORAL | Status: DC
Start: 2021-03-10 — End: 2021-03-10

## 2021-03-10 MED ORDER — HYDRALAZINE HCL 50 MG PO TABS
50.0000 mg | ORAL_TABLET | Freq: Three times a day (TID) | ORAL | Status: DC
  Administered 2021-03-10 – 2021-03-15 (×17): 50 mg via ORAL
  Filled 2021-03-10 (×17): qty 1

## 2021-03-10 MED ORDER — GUAIFENESIN ER 600 MG PO TB12
600.0000 mg | ORAL_TABLET | Freq: Two times a day (BID) | ORAL | Status: DC
  Administered 2021-03-10 – 2021-03-15 (×12): 600 mg via ORAL
  Filled 2021-03-10 (×12): qty 1

## 2021-03-10 MED ORDER — APIXABAN 2.5 MG PO TABS
2.5000 mg | ORAL_TABLET | Freq: Two times a day (BID) | ORAL | Status: DC
  Administered 2021-03-10 – 2021-03-15 (×12): 2.5 mg via ORAL
  Filled 2021-03-10 (×12): qty 1

## 2021-03-10 MED ORDER — ONDANSETRON HCL 4 MG/2ML IJ SOLN
4.0000 mg | Freq: Three times a day (TID) | INTRAMUSCULAR | Status: DC | PRN
  Administered 2021-03-10 – 2021-03-13 (×3): 4 mg via INTRAVENOUS
  Filled 2021-03-10 (×3): qty 2

## 2021-03-10 MED ORDER — FAMOTIDINE 20 MG PO TABS
10.0000 mg | ORAL_TABLET | Freq: Every evening | ORAL | Status: DC | PRN
  Administered 2021-03-13: 10 mg via ORAL
  Filled 2021-03-10: qty 1

## 2021-03-10 MED ORDER — FUROSEMIDE 10 MG/ML IJ SOLN
40.0000 mg | Freq: Every day | INTRAMUSCULAR | Status: DC
  Administered 2021-03-10 – 2021-03-13 (×4): 40 mg via INTRAVENOUS
  Filled 2021-03-10 (×4): qty 4

## 2021-03-10 MED ORDER — MORPHINE SULFATE (PF) 2 MG/ML IV SOLN
2.0000 mg | INTRAVENOUS | Status: DC | PRN
  Administered 2021-03-10 – 2021-03-11 (×3): 2 mg via INTRAVENOUS
  Filled 2021-03-10 (×3): qty 1

## 2021-03-10 MED ORDER — PERFLUTREN LIPID MICROSPHERE
1.0000 mL | INTRAVENOUS | Status: AC | PRN
  Administered 2021-03-10: 2 mL via INTRAVENOUS
  Filled 2021-03-10: qty 10

## 2021-03-10 MED ORDER — CHLORHEXIDINE GLUCONATE CLOTH 2 % EX PADS
6.0000 | MEDICATED_PAD | Freq: Every day | CUTANEOUS | Status: DC
  Administered 2021-03-10 – 2021-03-14 (×6): 6 via TOPICAL

## 2021-03-10 MED ORDER — HYDRALAZINE HCL 20 MG/ML IJ SOLN
10.0000 mg | Freq: Once | INTRAMUSCULAR | Status: AC
  Administered 2021-03-10: 10 mg via INTRAVENOUS
  Filled 2021-03-10: qty 1

## 2021-03-10 MED ORDER — METHYLPREDNISOLONE SODIUM SUCC 125 MG IJ SOLR
60.0000 mg | Freq: Once | INTRAMUSCULAR | Status: AC
  Administered 2021-03-11: 60 mg via INTRAVENOUS
  Filled 2021-03-10: qty 2

## 2021-03-10 MED ORDER — ACETAMINOPHEN 650 MG RE SUPP: 650.0000 mg | Freq: Four times a day (QID) | RECTAL | Status: DC | PRN

## 2021-03-10 MED ORDER — INSULIN ASPART 100 UNIT/ML IJ SOLN
0.0000 [IU] | Freq: Every day | INTRAMUSCULAR | Status: DC
  Administered 2021-03-10 – 2021-03-13 (×4): 3 [IU] via SUBCUTANEOUS
  Administered 2021-03-14: 5 [IU] via SUBCUTANEOUS
  Administered 2021-03-15: 3 [IU] via SUBCUTANEOUS

## 2021-03-10 NOTE — ED Notes (Signed)
ED TO INPATIENT HANDOFF REPORT  ED Nurse Name and Phone #: [phone] Erick Colace, RN   S Name/Age/Gender [facility] 88 y.o. female Room/Bed: WA23/WA23  Code Status   Code Status: Prior  Home/SNF/Other Home Patient oriented to: self, place, time, and situation Is this baseline? Yes   Triage Complete: Triage complete  Chief Complaint COPD exacerbation ([place]) [J44.1]  Triage Note BIB [facility] from home for SOB. Recently seen for same. She was dx'd with bronchitis. Today, caretaker called because she was coughing up Malayiah Mcbrayer stuff and SOB. She received a duoneb en route.    Allergies Allergies  Allergen Reactions   Beta Adrenergic Blockers Other (See Comments)    Fatigue    Level of Care/Admitting Diagnosis ED Disposition     ED Disposition  Admit   Condition  --   Comment  Hospital Area: [facility] [[id number]]  Level of Care: Stepdown [14]  Admit to SDU based on following criteria: Respiratory Distress:  Frequent assessment and/or intervention to maintain adequate ventilation/respiration, pulmonary toilet, and respiratory treatment.  May place patient in observation at [facility] or [place] if equivalent level of care is available:: Yes  Covid Evaluation: Asymptomatic Screening Protocol (No Symptoms)  Diagnosis: COPD exacerbation [facility]) [[id number]]  Admitting Physician: Shela Leff [[id number]]  Attending Physician: Shela Leff [[id number]]          B Medical/Surgery History Past Medical History:  Diagnosis Date   Arthritis    "mostly in my hands, lower back" (06/25/2017)   Basal cell carcinoma (BCC) of face 1983   Breast cancer, right breast ([place]) 03/2013   Chronic kidney disease (CKD), stage III (moderate) ([place])    nephrologist, Dr. [name]   Dental crowns present    DVT (deep venous thrombosis) ([place]) ~ 06/2013   "? side"   Gout    "on daily RX" (06/25/2017)   Heart murmur    no known problems; states did not know  she had murmur until age 42   High cholesterol    Hypertension    fluctuates, especially when stressed; has been on med. > 20 yr.   Immature cataract    Non-insulin dependent type 2 diabetes mellitus ([place])    Personal history of chemotherapy    Personal history of radiation therapy    Pulmonary embolism ([place]) ~ 06/2013   Radiation 09/06/13-10/20/13   Right Breast Cancer   Stroke ([place]) 05/2017   just visual problems since (06/25/2017)   Wears partial dentures    lower   Past Surgical History:  Procedure Laterality Date   AXILLARY LYMPH NODE DISSECTION Right 03/30/2013   Procedure: AXILLARY LYMPH NODE DISSECTION;  Surgeon: Rolm Bookbinder, MD;  Location: [place];  Service: General;  Laterality: Right;   BASAL CELL CARCINOMA EXCISION  1983   "face"   BREAST BIOPSY Right 03/17/2013   BREAST CYST EXCISION Right 11/1958   benign   BREAST LUMPECTOMY Right 03/30/2013   BREAST LUMPECTOMY WITH NEEDLE LOCALIZATION Right 03/30/2013   Procedure: BREAST LUMPECTOMY WITH NEEDLE LOCALIZATION;  Surgeon: Rolm Bookbinder, MD;  Location: [facility];  Service: General;  Laterality: Right;   DILATION AND CURETTAGE OF UTERUS     LOOP RECORDER INSERTION N/A 08/15/2017   Procedure: LOOP RECORDER INSERTION;  Surgeon: Evans Lance, MD;  Location: [facility];  Service: Cardiovascular;  Laterality: N/A;   PORT-A-CATH REMOVAL  2016   PORTACATH PLACEMENT N/A 04/15/2013   Procedure: INSERTION PORT-A-CATH;  Surgeon: Rolm Bookbinder, MD;  Location: [facility];  Service: General;  Laterality: N/A;   RE-EXCISION OF BREAST CANCER,SUPERIOR MARGINS Right 04/15/2013   Procedure: RE-EXCISION OF RIGHT BREAST  MARGINS;  Surgeon: Rolm Bookbinder, MD;  Location: [facility];  Service: General;  Laterality: Right;   TONSILLECTOMY  ~ 1935/1936     A IV Location/Drains/Wounds Patient Lines/Drains/Airways Status     Active Line/Drains/Airways     Name Placement date Placement time Site Days    Peripheral IV 03/09/21 20 G Anterior;Distal;Right;Lateral Forearm 03/09/21  2123  Forearm  1            Intake/Output Last 24 hours  Intake/Output Summary (Last 24 hours) at 03/10/2021 0254 Last data filed at 03/10/2021 0205 Gross per 24 hour  Intake 82.86 ml  Output --  Net 82.86 ml    Labs/Imaging Results for orders placed or performed during the hospital encounter of 03/09/21 (from the past 48 hour(s))  CBC with Differential/Platelet     Status: Abnormal   Collection Time: 03/09/21  9:24 PM  Result Value Ref Range   WBC 9.8 4.0 - 10.5 K/uL   RBC 2.93 (L) 3.87 - 5.11 MIL/uL   Hemoglobin 9.2 (L) 12.0 - 15.0 g/dL   HCT 29.5 (L) 36.0 - 46.0 %   MCV 100.7 (H) 80.0 - 100.0 fL   MCH 31.4 26.0 - 34.0 pg   MCHC 31.2 30.0 - 36.0 g/dL   RDW 15.3 11.5 - 15.5 %   Platelets 279 150 - 400 K/uL   nRBC 0.0 0.0 - 0.2 %   Neutrophils Relative % 56 %   Neutro Abs 5.5 1.7 - 7.7 K/uL   Lymphocytes Relative 30 %   Lymphs Abs 2.9 0.7 - 4.0 K/uL   Monocytes Relative 7 %   Monocytes Absolute 0.7 0.1 - 1.0 K/uL   Eosinophils Relative 7 %   Eosinophils Absolute 0.7 (H) 0.0 - 0.5 K/uL   Basophils Relative 0 %   Basophils Absolute 0.0 0.0 - 0.1 K/uL   Immature Granulocytes 0 %   Abs Immature Granulocytes 0.03 0.00 - 0.07 K/uL    Comment: Performed at [facility], [place] [address] [place], [place] [address]  Basic metabolic panel     Status: Abnormal   Collection Time: 03/09/21  9:24 PM  Result Value Ref Range   Sodium 132 (L) 135 - 145 mmol/L   Potassium 3.7 3.5 - 5.1 mmol/L   Chloride 91 (L) 98 - 111 mmol/L   CO2 29 22 - 32 mmol/L   Glucose, Bld 291 (H) 70 - 99 mg/dL    Comment: Glucose reference range applies only to samples taken after fasting for at least 8 hours.   BUN 68 (H) 8 - 23 mg/dL   Creatinine, Ser 2.53 (H) 0.44 - 1.00 mg/dL   Calcium 11.0 (H) 8.9 - 10.3 mg/dL   GFR, Estimated 17 (L) >60 mL/min    Comment: (NOTE) Calculated using the CKD-EPI Creatinine  Equation (2021)    Anion gap 12 5 - 15    Comment: Performed at [facility], [place] [address], [place], [place] [address]  Brain natriuretic peptide     Status: Abnormal   Collection Time: 03/09/21  9:24 PM  Result Value Ref Range   B Natriuretic Peptide 426.7 (H) 0.0 - 100.0 pg/mL    Comment: Performed at [facility], [place] [address], [place], [place] [address]  Troponin I (High Sensitivity)  Status: Abnormal   Collection Time: 03/09/21  9:24 PM  Result Value Ref Range   Troponin I (High Sensitivity) 27 (H) <18 ng/L    Comment: (NOTE) Elevated high sensitivity troponin I (hsTnI) values and significant  changes across serial measurements may suggest ACS but many other  chronic and acute conditions are known to elevate hsTnI results.  Refer to the "Links" section for chest pain algorithms and additional  guidance. Performed at [facility], [place] [address] [place], [place] [address]   Resp Panel by RT-PCR (Flu A&B, Covid) Nasopharyngeal Swab     Status: None   Collection Time: 03/09/21  9:40 PM   Specimen: Nasopharyngeal Swab; Nasopharyngeal(NP) swabs in vial transport medium  Result Value Ref Range   SARS Coronavirus 2 by RT PCR NEGATIVE NEGATIVE    Comment: (NOTE) SARS-CoV-2 target nucleic acids are NOT DETECTED.  The SARS-CoV-2 RNA is generally detectable in upper respiratory specimens during the acute phase of infection. The lowest concentration of SARS-CoV-2 viral copies this assay can detect is 138 copies/mL. A negative result does not preclude SARS-Cov-2 infection and should not be used as the sole basis for treatment or other patient management decisions. A negative result may occur with  improper specimen collection/handling, submission of specimen other than nasopharyngeal swab, presence of viral mutation(s) within the areas targeted by this assay, and inadequate number of viral copies(<138 copies/mL). A  negative result must be combined with clinical observations, patient history, and epidemiological information. The expected result is Negative.  Fact Sheet for Patients:  EntrepreneurPulse.com.au  Fact Sheet for Healthcare Providers:  IncredibleEmployment.be  This test is no t yet approved or cleared by the [place] FDA and  has been authorized for detection and/or diagnosis of SARS-CoV-2 by FDA under an Emergency Use Authorization (EUA). This EUA will remain  in effect (meaning this test can be used) for the duration of the COVID-19 declaration under Section 564(b)(1) of the Act, 21 U.S.C.section 360bbb-3(b)(1), unless the authorization is terminated  or revoked sooner.       Influenza A by PCR NEGATIVE NEGATIVE   Influenza B by PCR NEGATIVE NEGATIVE    Comment: (NOTE) The Xpert Xpress SARS-CoV-2/FLU/RSV plus assay is intended as an aid in the diagnosis of influenza from Nasopharyngeal swab specimens and should not be used as a sole basis for treatment. Nasal washings and aspirates are unacceptable for Xpert Xpress SARS-CoV-2/FLU/RSV testing.  Fact Sheet for Patients: EntrepreneurPulse.com.au  Fact Sheet for Healthcare Providers: IncredibleEmployment.be  This test is not yet approved or cleared by the [place] FDA and has been authorized for detection and/or diagnosis of SARS-CoV-2 by FDA under an Emergency Use Authorization (EUA). This EUA will remain in effect (meaning this test can be used) for the duration of the COVID-19 declaration under Section 564(b)(1) of the Act, 21 U.S.C. section 360bbb-3(b)(1), unless the authorization is terminated or revoked.  Performed at [facility], [place] [address], [place], [place] [address]   Troponin I (High Sensitivity)     Status: Abnormal   Collection Time: 03/09/21 11:07 PM  Result Value Ref Range   Troponin I (High Sensitivity)  58 (H) <18 ng/L    Comment: DELTA CHECK NOTED (NOTE) Elevated high sensitivity troponin I (hsTnI) values and significant  changes across serial measurements may suggest ACS but many other  chronic and acute conditions are known to elevate hsTnI results.  Refer to the Links section for chest pain algorithms and additional  guidance. Performed at  [facility], [place] [address], [place], [place] [address]    DG Chest Port 1 View  Result Date: 03/09/2021 CLINICAL DATA:  Shortness of breath. EXAM: PORTABLE CHEST 1 VIEW COMPARISON:  Radiograph 5 days ago 03/04/2021 chest CT 02/11/2021 FINDINGS: Patient's chin obscures the apices.The cardiomediastinal contours are unchanged. Some of the previous question left pulmonary nodules are again seen, some are obscured on the current exam. Planted loop recorder in the left chest wall. Pulmonary vasculature is normal. No consolidation, pleural effusion, or pneumothorax. No acute osseous abnormalities are seen. Surgical clips in the right axilla. IMPRESSION: 1. No acute abnormality. 2. Some of the previously questioned tiny left pulmonary nodules are again seen. Electronically Signed   By: Keith Rake M.D.   On: 03/09/2021 22:58    Pending Labs Unresulted Labs (From admission, onward)    None       Vitals/Pain Today's Vitals   03/10/21 0130 03/10/21 0145 03/10/21 0200 03/10/21 0230  BP: (!) 129/42 (!) 123/41 (!) 118/43 (!) 126/44  Pulse: 78 73 72 75  Resp: 18 17 17 17   Temp:    98 F (36.7 C)  TempSrc:    Oral  SpO2: 98% 98% 99% 97%  PainSc:        Isolation Precautions No active isolations  Medications Medications  nitroGLYCERIN 50 mg in dextrose 5 % 250 mL (0.2 mg/mL) infusion (0 mcg/min Intravenous Stopped 03/10/21 0205)  methylPREDNISolone sodium succinate (SOLU-MEDROL) 125 mg/2 mL injection 125 mg (125 mg Intravenous Given 03/09/21 2129)  magnesium sulfate IVPB 2 g 50 mL (0 g Intravenous Stopped 03/09/21 2257)   albuterol (PROVENTIL) (2.5 MG/3ML) 0.083% nebulizer solution 5 mg (5 mg Nebulization Given 03/09/21 2207)  ipratropium (ATROVENT) nebulizer solution 0.5 mg (0.5 mg Nebulization Given 03/09/21 2208)  diltiazem (CARDIZEM) injection 10 mg (10 mg Intravenous Given 03/09/21 2320)    Mobility walks with device High fall risk   Focused Assessments    R Recommendations: See Admitting Provider Note  Report given to:   Additional Notes:

## 2021-03-10 NOTE — Progress Notes (Signed)
RN removed PT from BiPAP and placed on 3 LPM at 1500.

## 2021-03-10 NOTE — ED Notes (Addendum)
Pt complaining of increased difficulty breathing after receiving nebulizer. Pt diaphoretic with labored breathing, RR 36. O2 sats noted to be decreasing, currently 90% from 99%. Increased O2 to 6 L/min. Pt O2 sats increased to 100%, pt endorses improvement.

## 2021-03-10 NOTE — H&P (Addendum)
History and Physical    Redonna Wilbert XNA:[id number] DOB: 08-16-1926 DOA: 03/09/2021  PCP: [name], MD  Patient coming from: Home  Chief Complaint: shortness of breath  HPI: [facility] Sabrina [name] is a 88 y.o. female with medical history significant of  a fib, DVT on eliquis, CKD4, DM2, HTN, chronic diastolic HF, chronic hypoxic respiratory failure on 3L [place], COPD. Presenting with shortness of breath. History from combination of patient and chart review as the patient can not tell me much about what led her here. She reports that she had shortness of breath and cough starting yesterday. She is not sure as to whether or not she tried anything to treat it. She is not sure as to whether or not she had any other symptoms. Per chart review, the patient had worsening shortness of breath and cough. She apparently has a difficult time with her inhalers. There was no fever noted by her caregiver. There was no N/V noted either. The caregiver called EMS. When EMS arrived, the patient did have increased WOB. They brought her to the ED for evaluation.    ED Course: She was COVID/flu negative. CXR was negative for acute process. She was given nebs and went into a fib w/ RVR. She also became hypertensive. She was given IV dilt x 1 and placed on nitro gtt. [facility] was called for admission.   Review of Systems:  Denies CP, abdominal pain, N/V/D, fever, sick contacts. Review of systems is otherwise negative for all not mentioned in HPI.   PMHx Past Medical History:  Diagnosis Date   Arthritis    "mostly in my hands, lower back" (06/25/2017)   Basal cell carcinoma (BCC) of face 1983   Breast cancer, right breast ([place]) 03/2013   Chronic kidney disease (CKD), stage III (moderate) ([place])    nephrologist, Dr. [name]   Dental crowns present    DVT (deep venous thrombosis) ([place]) ~ 06/2013   "? side"   Gout    "on daily RX" (06/25/2017)   Heart murmur    no known problems; states did not know she had  murmur until age 88   High cholesterol    Hypertension    fluctuates, especially when stressed; has been on med. > 20 yr.   Immature cataract    Non-insulin dependent type 2 diabetes mellitus ([place])    Personal history of chemotherapy    Personal history of radiation therapy    Pulmonary embolism ([place]) ~ 06/2013   Radiation 09/06/13-10/20/13   Right Breast Cancer   Stroke ([place]) 05/2017   just visual problems since (06/25/2017)   Wears partial dentures    lower    PSHx Past Surgical History:  Procedure Laterality Date   AXILLARY LYMPH NODE DISSECTION Right 03/30/2013   Procedure: AXILLARY LYMPH NODE DISSECTION;  Surgeon: Rolm Bookbinder, MD;  Location: [facility];  Service: General;  Laterality: Right;   BASAL CELL CARCINOMA EXCISION  1983   "face"   BREAST BIOPSY Right 03/17/2013   BREAST CYST EXCISION Right 11/1958   benign   BREAST LUMPECTOMY Right 03/30/2013   BREAST LUMPECTOMY WITH NEEDLE LOCALIZATION Right 03/30/2013   Procedure: BREAST LUMPECTOMY WITH NEEDLE LOCALIZATION;  Surgeon: Rolm Bookbinder, MD;  Location: [facility];  Service: General;  Laterality: Right;   DILATION AND CURETTAGE OF UTERUS     LOOP RECORDER INSERTION N/A 08/15/2017   Procedure: LOOP RECORDER INSERTION;  Surgeon: Evans Lance, MD;  Location: [facility];  Service: Cardiovascular;  Laterality: N/A;   PORT-A-CATH REMOVAL  2016   PORTACATH PLACEMENT N/A 04/15/2013   Procedure: INSERTION PORT-A-CATH;  Surgeon: Rolm Bookbinder, MD;  Location: [facility];  Service: General;  Laterality: N/A;   RE-EXCISION OF BREAST CANCER,SUPERIOR MARGINS Right 04/15/2013   Procedure: RE-EXCISION OF RIGHT BREAST  MARGINS;  Surgeon: Rolm Bookbinder, MD;  Location: [facility];  Service: General;  Laterality: Right;   TONSILLECTOMY  ~ 1935/1936    SocHx  reports that she has never smoked. She has never used smokeless tobacco. She reports that she does not currently use alcohol after a past usage  of about 3.0 standard drinks per week. She reports that she does not use drugs.  Allergies  Allergen Reactions   Beta Adrenergic Blockers Other (See Comments)    Fatigue    FamHx Family History  Problem Relation Age of Onset   Pneumonia Mother    Heart attack Father    Breast cancer Other 40       niece   Breast cancer Other 69       niece   Ovarian cancer Other        niece    Prior to Admission medications   Medication Sig Start Date End Date Taking? Authorizing Provider  acetaminophen (TYLENOL) 500 MG tablet Take 500 mg by mouth daily as needed (for foot pain).    [provider]  albuterol (VENTOLIN HFA) 108 (90 Base) MCG/ACT inhaler Inhale 1-2 puffs into the lungs every 6 (six) hours as needed for wheezing or shortness of breath. 03/22/19   [provider]  allopurinol (ZYLOPRIM) 100 MG tablet Take 100 mg by mouth daily. 01/17/16   [provider]  benzonatate (TESSALON) 100 MG capsule Take 1 capsule (100 mg total) by mouth every 8 (eight) hours. 03/04/21   Domenic Moras, PA-C  cholecalciferol (VITAMIN D) 1000 UNITS tablet Take 2,000 Units by mouth daily.    [provider]  cloNIDine (CATAPRES) 0.2 MG tablet Take 0.2 mg by mouth 2 (two) times daily.    [provider]  Dextromethorphan-guaiFENesin (MUCINEX DM PO) Take 30 mLs by mouth daily as needed (cough).    [provider]  diltiazem (CARDIZEM) 60 MG tablet Take 1 tablet (60 mg total) by mouth every 8 (eight) hours. 02/13/21 03/15/21  Arrien, Jimmy Picket, MD  ELIQUIS 2.5 MG TABS tablet TAKE 1 TABLET BY MOUTH TWICE DAILY. Patient taking differently: Take 2.5 mg by mouth 2 (two) times daily. 07/04/20   Fenton, Clint R, PA  famotidine (PEPCID) 20 MG tablet Take 20 mg by mouth daily. 11/09/19   [provider]  furosemide (LASIX) 40 MG tablet Take 40 mg by mouth daily. Per Dr. If over 166lb take 80mg  a day    [provider]  glipiZIDE (GLUCOTROL) 5 MG tablet  Take 5 mg by mouth daily.    [provider]  hydrALAZINE (APRESOLINE) 50 MG tablet Take 50 mg by mouth 3 (three) times daily.    [provider]  hydrocortisone cream 1 % Apply 1 application topically daily as needed for itching.    [provider]  ipratropium-albuterol (DUONEB) 0.5-2.5 (3) MG/3ML SOLN Take 3 mLs by nebulization every 6 (six) hours as needed (wheezing and shortness of breath). 02/13/21   Arrien, Jimmy Picket, MD  isosorbide mononitrate (IMDUR) 30 MG 24 hr tablet Take 1 tablet (30 mg total) by mouth daily. 12/22/20 02/07/22  Pokhrel, Corrie Mckusick, MD  mometasone-formoterol (DULERA) 200-5 MCG/ACT AERO  Inhale 2 puffs into the lungs 2 (two) times daily. 02/13/21   Arrien, Jimmy Picket, MD  polyethylene glycol [facility] / Floria Raveling) packet Take 17 g by mouth daily.    [provider]  TRUE METRIX BLOOD GLUCOSE TEST test strip  07/19/19   [provider]  TRUEplus Lancets 28G MISC Apply topically. 08/20/19   [provider]    Physical Exam: Vitals:   03/10/21 0700 03/10/21 0800 03/10/21 0806 03/10/21 0830  BP: (!) 169/48 (!) 221/72  (!) 192/59  Pulse: 88 (!) 110  76  Resp: (!) 27 (!) 25  (!) 31  Temp:   99.5 F (37.5 C)   TempSrc:   Axillary   SpO2: 98% 93%  99%  Weight:      Height:        General: 88 y.o. female resting in bed in NAD Eyes: PERRL, normal sclera ENMT: Nares patent w/o discharge, orophaynx clear, dentition normal, ears w/o discharge/lesions/ulcers Neck: Supple, trachea midline Cardiovascular: tachy +S1, S2, no g/r, 1/6 SEM equal pulses throughout Respiratory: decreased at bases, no w/r/r, increased WOB GI: BS+, NDNT, no masses noted, no organomegaly noted MSK: No c/c; trace b/l pedal edema Neuro: A&O x 3, no focal deficits Psyc: Appropriate interaction and affect, calm/cooperative  Labs on Admission: I have personally reviewed following labs and imaging studies  CBC: Recent Labs  Lab 03/04/21 0954  03/09/21 2124 03/10/21 0800  WBC 8.7 9.8 10.1  NEUTROABS 5.0 5.5 8.6*  HGB 9.2* 9.2* 9.3*  HCT 29.1* 29.5* 28.4*  MCV 102.5* 100.7* 100.0  PLT 190 279 595   Basic Metabolic Panel: Recent Labs  Lab 03/04/21 0954 03/09/21 2124  NA 136 132*  K 3.9 3.7  CL 97* 91*  CO2 28 29  GLUCOSE 298* 291*  BUN 78* 68*  CREATININE 2.83* 2.53*  CALCIUM 10.9* 11.0*   GFR: Estimated Creatinine Clearance: 12.5 mL/min (A) (by C-G formula based on SCr of 2.53 mg/dL (H)). Liver Function Tests: No results for input(s): AST, ALT, ALKPHOS, BILITOT, PROT, ALBUMIN in the last 168 hours. No results for input(s): LIPASE, AMYLASE in the last 168 hours. No results for input(s): AMMONIA in the last 168 hours. Coagulation Profile: No results for input(s): INR, PROTIME in the last 168 hours. Cardiac Enzymes: No results for input(s): CKTOTAL, CKMB, CKMBINDEX, TROPONINI in the last 168 hours. BNP (last 3 results) No results for input(s): PROBNP in the last 8760 hours. HbA1C: No results for input(s): HGBA1C in the last 72 hours. CBG: Recent Labs  Lab 03/10/21 0754  GLUCAP 428*   Lipid Profile: No results for input(s): CHOL, HDL, LDLCALC, TRIG, CHOLHDL, LDLDIRECT in the last 72 hours. Thyroid Function Tests: No results for input(s): TSH, T4TOTAL, FREET4, T3FREE, THYROIDAB in the last 72 hours. Anemia Panel: No results for input(s): VITAMINB12, FOLATE, FERRITIN, TIBC, IRON, RETICCTPCT in the last 72 hours. Urine analysis:    Component Value Date/Time   COLORURINE YELLOW 02/07/2021 2200   APPEARANCEUR CLEAR 02/07/2021 2200   LABSPEC <=1.005 02/07/2021 2200   PHURINE 6.0 02/07/2021 2200   GLUCOSEU 100 (A) 02/07/2021 2200   HGBUR NEGATIVE 02/07/2021 2200   BILIRUBINUR NEGATIVE 02/07/2021 2200   KETONESUR NEGATIVE 02/07/2021 2200   PROTEINUR NEGATIVE 02/07/2021 2200   UROBILINOGEN 0.2 09/13/2013 0523   NITRITE NEGATIVE 02/07/2021 2200   LEUKOCYTESUR SMALL (A) 02/07/2021 2200    Radiological  Exams on Admission: DG Chest Port 1 View  Result Date: 03/09/2021 CLINICAL DATA:  Shortness of breath. EXAM: PORTABLE  CHEST 1 VIEW COMPARISON:  Radiograph 5 days ago 03/04/2021 chest CT 02/11/2021 FINDINGS: Patient's chin obscures the apices.The cardiomediastinal contours are unchanged. Some of the previous question left pulmonary nodules are again seen, some are obscured on the current exam. Planted loop recorder in the left chest wall. Pulmonary vasculature is normal. No consolidation, pleural effusion, or pneumothorax. No acute osseous abnormalities are seen. Surgical clips in the right axilla. IMPRESSION: 1. No acute abnormality. 2. Some of the previously questioned tiny left pulmonary nodules are again seen. Electronically Signed   By: Keith Rake M.D.   On: 03/09/2021 22:58    EKG: Independently reviewed. A fib, no st elevations  Assessment/Plan Hypertensive urgency     - admitted to inpt, SDU     - nitro gtt has been held; her BP has come back up, resume her home meds     - checking echo  A fib RVR     - her heart rate is improved     - continue her home eliquis and dilt  COPD exacerbation Chronic hypoxic respiratory failure Dyspnea     - COVID/flu negative     - CXR is negative     - we can check a CT chest w/o contrast     - check procal and RVP     - symptomatic care for now w/ inhalers, O2 UPDATE: spoke with PCCM about her change in respiratory status; increasing to BiPap and adding morphine for air hunger  CKD4     - she is at baseline, follow     - watch nephrotoxins     - she will get a little lasix  DM2     - carb mod diet     - A1c, SSI, glucose checks  Hyponatremia Hypercalcemia     - right now there is concern with her BNP slightly elevated that an element of HF is contributing to her dyspnea; so she will receive gentle diuresis today; this should also help with the Ca2+     - will also check PTH  Macrocytic anemia Anemia of chronic disease     -  stable, no evidence of bleed, follow  Acute on Chronic diastolic HF Elevated troponin     - first EKG didn't show any St elevations and she doesn't c/o chest pain; she's getting an AM EKG today     - trend another cycle of trp (27 -> 58)     - check echo     - will add lasix 40mg  IV daily     - watch I&O, daily wts     - continue home regime otherwise  DVT prophylaxis: eliquis  Code Status: DNR confirmed with nephew  Family Communication: w/ nephew by phone  Consults called: None   Status is: Inpatient  Remains inpatient appropriate because: severity of illness  Time spent coordinating admission: 80 minutes  Felton Buczynski A Amayiah Gosnell DO [facility]  If 7PM-7AM, please contact night-coverage [email or web address]  03/10/2021, 8:38 AM

## 2021-03-10 NOTE — ED Notes (Signed)
Per MD Ralene Bathe, d/c Nitro drip.

## 2021-03-11 LAB — COMPREHENSIVE METABOLIC PANEL
ALT: 14 U/L (ref 0–44)
AST: 15 U/L (ref 15–41)
Albumin: 3.6 g/dL (ref 3.5–5.0)
Alkaline Phosphatase: 75 U/L (ref 38–126)
Anion gap: 10 (ref 5–15)
BUN: 73 mg/dL — ABNORMAL HIGH (ref 8–23)
CO2: 33 mmol/L — ABNORMAL HIGH (ref 22–32)
Calcium: 11.1 mg/dL — ABNORMAL HIGH (ref 8.9–10.3)
Chloride: 92 mmol/L — ABNORMAL LOW (ref 98–111)
Creatinine, Ser: 2.61 mg/dL — ABNORMAL HIGH (ref 0.44–1.00)
GFR, Estimated: 17 mL/min — ABNORMAL LOW (ref >60)
Glucose, Bld: 299 mg/dL — ABNORMAL HIGH (ref 70–99)
Potassium: 4.2 mmol/L (ref 3.5–5.1)
Sodium: 135 mmol/L (ref 135–145)
Total Bilirubin: 0.7 mg/dL (ref 0.3–1.2)
Total Protein: 6.7 g/dL (ref 6.5–8.1)

## 2021-03-11 LAB — GLUCOSE, CAPILLARY
Glucose-Capillary: 313 mg/dL — ABNORMAL HIGH (ref 70–99)
Glucose-Capillary: 326 mg/dL — ABNORMAL HIGH (ref 70–99)
Glucose-Capillary: 291 mg/dL — ABNORMAL HIGH (ref 70–99)
Glucose-Capillary: 292 mg/dL — ABNORMAL HIGH (ref 70–99)

## 2021-03-11 LAB — CBC
HCT: 26.5 % — ABNORMAL LOW (ref 36.0–46.0)
Hemoglobin: 8.4 g/dL — ABNORMAL LOW (ref 12.0–15.0)
MCH: 32.2 pg (ref 26.0–34.0)
MCHC: 31.7 g/dL (ref 30.0–36.0)
MCV: 101.5 fL — ABNORMAL HIGH (ref 80.0–100.0)
Platelets: 277 10*3/uL (ref 150–400)
RBC: 2.61 MIL/uL — ABNORMAL LOW (ref 3.87–5.11)
RDW: 15.3 % (ref 11.5–15.5)
WBC: 13.5 10*3/uL — ABNORMAL HIGH (ref 4.0–10.5)
nRBC: 0 % (ref 0.0–0.2)

## 2021-03-11 MED ORDER — LIP MEDEX EX OINT
TOPICAL_OINTMENT | CUTANEOUS | Status: DC | PRN
  Filled 2021-03-11: qty 7

## 2021-03-11 MED ORDER — ORAL CARE MOUTH RINSE
15.0000 mL | Freq: Two times a day (BID) | OROMUCOSAL | Status: DC
  Administered 2021-03-11 – 2021-03-15 (×9): 15 mL via OROMUCOSAL

## 2021-03-11 NOTE — Progress Notes (Signed)
Patient had episode of brown colored emesis less than an hour after BIPAP being placed. Mask removed, and antiemetic ordered and given as prescribed. Patient has been able to maintain oxygen saturation on 5LNC. Patient has given no signs that she may have aspirated during this time, and has not been nauseous since medication given. Will continue to assess for change in status.

## 2021-03-11 NOTE — Progress Notes (Addendum)
PROGRESS NOTE    Sabrina [name]  JSE:[id number] DOB: [birth year] DOA: 03/09/2021 PCP: [name], MD   Brief Narrative:  This 88 years old female with PMH significant for A. Fibrillation, DVT on Eliquis, CKD 4, DM 2, hypertension, chronic diastolic heart failure, chronic hypoxic respiratory failure on 3 L of supplemental oxygen at baseline, COPD presented in the ED with shortness of breath.  History is obtained from ED chart since patient has baseline dementia.  Patient was found to have significant wheezing and was given breathing treatment, she went into A. fib with RVR.  She was given IV Cardizem and started on nitro gtt. for hypertensive urgency. Patient is admitted for COPD and possible CHF exacerbation.  Assessment & Plan:   Principal Problem:   COPD exacerbation ([place]) Active Problems:   Atrial fibrillation with RVR ([facility])  Hypertensive urgency: She was severely hypertensive  231/75 on arrival. Patient was started on nitro gtt., Blood pressure has improved. She was resumed on her blood pressure medications. Nitro gtt discontinued. Continue hydralazine, Imdur, Cardizem, clonidine, Lasix.  Atrial fibrillation with RVR: Heart rate is now improved. Continue Cardizem 60 mg every 8 hours,  Continue Eliquis  COPD exacerbation: Chronic hypoxic respiratory failure: Patient remains on her baseline oxygen requirement. COVID, influenza negative.  Chest x-ray unremarkable. Continue steroids and nebulized bronchodilators.  CKD stage IV: Serum creatinine at baseline. Avoid nephrotoxic medications  Type 2 diabetes: Carb modified diet, regular insulin sliding scale. Hemoglobin A1c 8.8.  Hypercalcemia: Continue IV hydration.  Monitor serum calcium.  Elevated troponin: Acute on chronic diastolic CHF: Patient denies any chest pain,  it could be due to demand ischemia. 2D echocardiogram unremarkable.  LVEF 55 to 60%. Continue Lasix 40 mg IV daily. Cardiology consulted.   Awaiting recommendation  Macrocytic anemia: H&H is stable, no evidence of bleeding.  DVT prophylaxis: Eliquis Code Status:DNR Family Communication: Caretaker at bed side. Disposition Plan:   Status is: Inpatient  Remains inpatient appropriate because:   Admitted for CHF and COPD exacerbation. Hospital course complicated by A. fib with RVR requiring IV Cardizem.   Consultants:  Cardiology  Procedures: Echocardiogram  Antimicrobials: None.  Subjective: Patient was seen and examined at bedside.  Overnight events noted.   Patient reports feeling much improved.  She is fully oriented, and following commands.  Objective: Vitals:   03/11/21 0800 03/11/21 0823 03/11/21 1000 03/11/21 1200  BP: (!) 145/51  (!) 130/39 (!) 122/35  Pulse: 73  66 68  Resp: (!) 23  (!) 23 16  Temp: 99 F (37.2 C)   99.7 F (37.6 C)  TempSrc: Axillary   Axillary  SpO2: 98% 97% 98% 98%  Weight:      Height:        Intake/Output Summary (Last 24 hours) at 03/11/2021 1333 Last data filed at 03/11/2021 0550 Gross per 24 hour  Intake 260.12 ml  Output 1275 ml  Net -1014.88 ml   Filed Weights   03/10/21 0400 03/11/21 0500  Weight: 74.2 kg 71 kg    Examination:  General exam: Appears comfortable, deconditioned, not in any distress. Respiratory system: Clear to auscultation bilaterally, respiratory effort normal, RR 15. Cardiovascular system: S1-S2 heard, regular rate and rhythm, murmur+ Gastrointestinal system: Abdomen is soft, distended, nontender, BS+ Central nervous system: Alert and oriented X 2. No focal neurological deficits. Extremities: No edema, no cyanosis, no clubbing. Skin: No rashes, lesions or ulcers Psychiatry: Judgement and insight appear normal. Mood & affect appropriate.     Data Reviewed: I  have personally reviewed following labs and imaging studies  CBC: Recent Labs  Lab 03/09/21 2124 03/10/21 0800 03/11/21 0240  WBC 9.8 10.1 13.5*  NEUTROABS 5.5 8.6*  --   HGB  9.2* 9.3* 8.4*  HCT 29.5* 28.4* 26.5*  MCV 100.7* 100.0 101.5*  PLT 279 285 017   Basic Metabolic Panel: Recent Labs  Lab 03/09/21 2124 03/10/21 0800 03/11/21 0240  NA 132* 132* 135  K 3.7 4.3 4.2  CL 91* 90* 92*  CO2 29 27 33*  GLUCOSE 291* 475* 299*  BUN 68* 72* 73*  CREATININE 2.53* 2.62* 2.61*  CALCIUM 11.0* 11.2* 11.1*   GFR: Estimated Creatinine Clearance: 11.8 mL/min (A) (by C-G formula based on SCr of 2.61 mg/dL (H)). Liver Function Tests: Recent Labs  Lab 03/10/21 0800 03/11/21 0240  AST 17 15  ALT 17 14  ALKPHOS 92 75  BILITOT 1.1 0.7  PROT 7.3 6.7  ALBUMIN 3.7 3.6   No results for input(s): LIPASE, AMYLASE in the last 168 hours. No results for input(s): AMMONIA in the last 168 hours. Coagulation Profile: No results for input(s): INR, PROTIME in the last 168 hours. Cardiac Enzymes: No results for input(s): CKTOTAL, CKMB, CKMBINDEX, TROPONINI in the last 168 hours. BNP (last 3 results) No results for input(s): PROBNP in the last 8760 hours. HbA1C: Recent Labs    03/10/21 0800  HGBA1C 8.8*   CBG: Recent Labs  Lab 03/10/21 0754 03/10/21 1151 03/10/21 1633 03/10/21 2115 03/11/21 0747  GLUCAP 428* 379* 326* 280* 313*   Lipid Profile: No results for input(s): CHOL, HDL, LDLCALC, TRIG, CHOLHDL, LDLDIRECT in the last 72 hours. Thyroid Function Tests: No results for input(s): TSH, T4TOTAL, FREET4, T3FREE, THYROIDAB in the last 72 hours. Anemia Panel: No results for input(s): VITAMINB12, FOLATE, FERRITIN, TIBC, IRON, RETICCTPCT in the last 72 hours. Sepsis Labs: No results for input(s): PROCALCITON, LATICACIDVEN in the last 168 hours.  Recent Results (from the past 240 hour(s))  Resp Panel by RT-PCR (Flu A&B, Covid) Nasopharyngeal Swab     Status: None   Collection Time: 03/04/21  9:55 AM   Specimen: Nasopharyngeal Swab; Nasopharyngeal(NP) swabs in vial transport medium  Result Value Ref Range Status   SARS Coronavirus 2 by RT PCR NEGATIVE  NEGATIVE Final    Comment: (NOTE) SARS-CoV-2 target nucleic acids are NOT DETECTED.  The SARS-CoV-2 RNA is generally detectable in upper respiratory specimens during the acute phase of infection. The lowest concentration of SARS-CoV-2 viral copies this assay can detect is 138 copies/mL. A negative result does not preclude SARS-Cov-2 infection and should not be used as the sole basis for treatment or other patient management decisions. A negative result may occur with  improper specimen collection/handling, submission of specimen other than nasopharyngeal swab, presence of viral mutation(s) within the areas targeted by this assay, and inadequate number of viral copies(<138 copies/mL). A negative result must be combined with clinical observations, patient history, and epidemiological information. The expected result is Negative.  Fact Sheet for Patients:  EntrepreneurPulse.com.au  Fact Sheet for Healthcare Providers:  IncredibleEmployment.be  This test is no t yet approved or cleared by the [place] FDA and  has been authorized for detection and/or diagnosis of SARS-CoV-2 by FDA under an Emergency Use Authorization (EUA). This EUA will remain  in effect (meaning this test can be used) for the duration of the COVID-19 declaration under Section 564(b)(1) of the Act, 21 U.S.C.section 360bbb-3(b)(1), unless the authorization is terminated  or revoked sooner.  Influenza A by PCR NEGATIVE NEGATIVE Final   Influenza B by PCR NEGATIVE NEGATIVE Final    Comment: (NOTE) The Xpert Xpress SARS-CoV-2/FLU/RSV plus assay is intended as an aid in the diagnosis of influenza from Nasopharyngeal swab specimens and should not be used as a sole basis for treatment. Nasal washings and aspirates are unacceptable for Xpert Xpress SARS-CoV-2/FLU/RSV testing.  Fact Sheet for Patients: EntrepreneurPulse.com.au  Fact Sheet for Healthcare  Providers: IncredibleEmployment.be  This test is not yet approved or cleared by the [place] FDA and has been authorized for detection and/or diagnosis of SARS-CoV-2 by FDA under an Emergency Use Authorization (EUA). This EUA will remain in effect (meaning this test can be used) for the duration of the COVID-19 declaration under Section 564(b)(1) of the Act, 21 U.S.C. section 360bbb-3(b)(1), unless the authorization is terminated or revoked.  Performed at [facility], [place] [address] [place], [place] [address]   Resp Panel by RT-PCR (Flu A&B, Covid) Nasopharyngeal Swab     Status: None   Collection Time: 03/09/21  9:40 PM   Specimen: Nasopharyngeal Swab; Nasopharyngeal(NP) swabs in vial transport medium  Result Value Ref Range Status   SARS Coronavirus 2 by RT PCR NEGATIVE NEGATIVE Final    Comment: (NOTE) SARS-CoV-2 target nucleic acids are NOT DETECTED.  The SARS-CoV-2 RNA is generally detectable in upper respiratory specimens during the acute phase of infection. The lowest concentration of SARS-CoV-2 viral copies this assay can detect is 138 copies/mL. A negative result does not preclude SARS-Cov-2 infection and should not be used as the sole basis for treatment or other patient management decisions. A negative result may occur with  improper specimen collection/handling, submission of specimen other than nasopharyngeal swab, presence of viral mutation(s) within the areas targeted by this assay, and inadequate number of viral copies(<138 copies/mL). A negative result must be combined with clinical observations, patient history, and epidemiological information. The expected result is Negative.  Fact Sheet for Patients:  EntrepreneurPulse.com.au  Fact Sheet for Healthcare Providers:  IncredibleEmployment.be  This test is no t yet approved or cleared by the [place] FDA and  has been  authorized for detection and/or diagnosis of SARS-CoV-2 by FDA under an Emergency Use Authorization (EUA). This EUA will remain  in effect (meaning this test can be used) for the duration of the COVID-19 declaration under Section 564(b)(1) of the Act, 21 U.S.C.section 360bbb-3(b)(1), unless the authorization is terminated  or revoked sooner.       Influenza A by PCR NEGATIVE NEGATIVE Final   Influenza B by PCR NEGATIVE NEGATIVE Final    Comment: (NOTE) The Xpert Xpress SARS-CoV-2/FLU/RSV plus assay is intended as an aid in the diagnosis of influenza from Nasopharyngeal swab specimens and should not be used as a sole basis for treatment. Nasal washings and aspirates are unacceptable for Xpert Xpress SARS-CoV-2/FLU/RSV testing.  Fact Sheet for Patients: EntrepreneurPulse.com.au  Fact Sheet for Healthcare Providers: IncredibleEmployment.be  This test is not yet approved or cleared by the [place] FDA and has been authorized for detection and/or diagnosis of SARS-CoV-2 by FDA under an Emergency Use Authorization (EUA). This EUA will remain in effect (meaning this test can be used) for the duration of the COVID-19 declaration under Section 564(b)(1) of the Act, 21 U.S.C. section 360bbb-3(b)(1), unless the authorization is terminated or revoked.  Performed at [facility], [place] [address], [place], [place] [address]   Respiratory (~20 pathogens) panel by PCR     Status: None  Collection Time: 03/10/21  3:11 PM   Specimen: Nasopharyngeal Swab; Respiratory  Result Value Ref Range Status   Adenovirus NOT DETECTED NOT DETECTED Final   Coronavirus 229E NOT DETECTED NOT DETECTED Final    Comment: (NOTE) The Coronavirus on the Respiratory Panel, DOES NOT test for the novel  Coronavirus (2019 nCoV)    Coronavirus HKU1 NOT DETECTED NOT DETECTED Final   Coronavirus NL63 NOT DETECTED NOT DETECTED Final   Coronavirus OC43 NOT  DETECTED NOT DETECTED Final   Metapneumovirus NOT DETECTED NOT DETECTED Final   Rhinovirus / Enterovirus NOT DETECTED NOT DETECTED Final   Influenza A NOT DETECTED NOT DETECTED Final   Influenza B NOT DETECTED NOT DETECTED Final   Parainfluenza Virus 1 NOT DETECTED NOT DETECTED Final   Parainfluenza Virus 2 NOT DETECTED NOT DETECTED Final   Parainfluenza Virus 3 NOT DETECTED NOT DETECTED Final   Parainfluenza Virus 4 NOT DETECTED NOT DETECTED Final   Respiratory Syncytial Virus NOT DETECTED NOT DETECTED Final   Bordetella pertussis NOT DETECTED NOT DETECTED Final   Bordetella Parapertussis NOT DETECTED NOT DETECTED Final   Chlamydophila pneumoniae NOT DETECTED NOT DETECTED Final   Mycoplasma pneumoniae NOT DETECTED NOT DETECTED Final    Comment: Performed at [facility], [address], [place], [place] [address]   Radiology Studies: DG Chest Port 1 View  Result Date: 03/09/2021 CLINICAL DATA:  Shortness of breath. EXAM: PORTABLE CHEST 1 VIEW COMPARISON:  Radiograph 5 days ago 03/04/2021 chest CT 02/11/2021 FINDINGS: Patient's chin obscures the apices.The cardiomediastinal contours are unchanged. Some of the previous question left pulmonary nodules are again seen, some are obscured on the current exam. Planted loop recorder in the left chest wall. Pulmonary vasculature is normal. No consolidation, pleural effusion, or pneumothorax. No acute osseous abnormalities are seen. Surgical clips in the right axilla. IMPRESSION: 1. No acute abnormality. 2. Some of the previously questioned tiny left pulmonary nodules are again seen. Electronically Signed   By: Keith Rake M.D.   On: 03/09/2021 22:58   ECHOCARDIOGRAM COMPLETE  Result Date: 03/10/2021    ECHOCARDIOGRAM REPORT   Patient Name:   [facility] Date of Exam: 03/10/2021 Medical Rec #:  [id number]          Height:       59.0 in Accession #:    [id number]         Weight:       163.6 lb Date of Birth:  1926/09/25          BSA:           1.693 m Patient Age:    35 years           BP:           169/48 mmHg Patient Gender: F                  HR:           88 bpm. Exam Location:  Inpatient Procedure: 2D Echo, Cardiac Doppler, Color Doppler and Intracardiac            Opacification Agent Indications:    Elevated Troponin  History:        Patient has prior history of Echocardiogram examinations, most                 recent 12/19/2020. Pacemaker, COPD and Stroke, Arrythmias:Atrial                 Fibrillation, Signs/Symptoms:Edema;  Risk Factors:Diabetes and                 Hypertension.  Sonographer:    Glo Herring Referring Phys: [id number] [place]  1. Left ventricular ejection fraction, by estimation, is 60 to 65%. The left ventricle has normal function. The left ventricle has no regional wall motion abnormalities.  2. Right ventricular systolic function is normal. The right ventricular size is normal.  3. MV is thickened. Peakd and mean gradients through the valve are 13 and 6 mm Hg with MVA by VTI of 1.5 cm2 consistent with mild to moderate MS. No significant change in gradients from report from 12/19/20. . The mitral valve is degenerative. Trivial mitral valve regurgitation. Moderate to severe mitral annular calcification.  4. AV is thickened, calcified with restricted motion. Peak and mean gradients through the valve are 49 and 27 mm HG respectively. AVA (VTI) 1.04 cm2 Dimensionless index is 0.37. Consistent with moderate AS. No significant change from mean gradient from 12/19/20 (25 to 27 mm Hg). Aortic valve regurgitation is mild.  5. The inferior vena cava is dilated in size with <50% respiratory variability, suggesting right atrial pressure of 15 mmHg. FINDINGS  Left Ventricle: Left ventricular ejection fraction, by estimation, is 60 to 65%. The left ventricle has normal function. The left ventricle has no regional wall motion abnormalities. The left ventricular internal cavity size was normal in size. Right Ventricle: The  right ventricular size is normal. Right vetricular wall thickness was not assessed. Right ventricular systolic function is normal. Right Atrium: Right atrial size was normal in size. Pericardium: There is no evidence of pericardial effusion. Mitral Valve: MV is thickened. Peakd and mean gradients through the valve are 13 and 6 mm Hg with MVA by VTI of 1.5 cm2 consistent with mild to moderate MS. No significant change in gradients from report from 12/19/20. The mitral valve is degenerative in  appearance. Moderate to severe mitral annular calcification. Trivial mitral valve regurgitation. MV peak gradient, 14.3 mmHg. The mean mitral valve gradient is 6.3 mmHg. Tricuspid Valve: The tricuspid valve is normal in structure. Tricuspid valve regurgitation is mild. Aortic Valve: AV is thickened, calcified with restricted motion. Peak and mean gradients through the valve are 49 and 27 mm HG respectively. AVA (VTI) 1.04 cm2 Dimensionless index is 0.37. Consistent with moderate AS. No significant change from mean gradient from 12/19/20 (25 to 27 mm Hg). Aortic valve regurgitation is mild. Aortic valve mean gradient measures 22.0 mmHg. Aortic valve peak gradient measures 39.9 mmHg. Aortic valve area, by VTI measures 1.05 cm. Pulmonic Valve: The pulmonic valve was not well visualized. Pulmonic valve regurgitation is mild. No evidence of pulmonic stenosis. Aorta: The aortic root and ascending aorta are structurally normal, with no evidence of dilitation. Venous: The inferior vena cava is dilated in size with less than 50% respiratory variability, suggesting right atrial pressure of 15 mmHg. IAS/Shunts: The interatrial septum was not assessed.  LEFT VENTRICLE PLAX 2D LVOT diam:     1.90 cm LV SV:         66 LV SV Index:   39 LVOT Area:     2.84 cm  RIGHT VENTRICLE             IVC RV Basal diam:  4.20 cm     IVC diam: 2.60 cm RV S prime:     15.90 cm/s RIGHT ATRIUM           Index RA Area:  12.10 cm RA Volume:   29.20 ml   17.24 ml/m  AORTIC VALVE                     PULMONIC VALVE AV Area (Vmax):    0.93 cm      PV Vmax:       1.33 m/s AV Area (Vmean):   0.89 cm      PV Peak grad:  7.1 mmHg AV Area (VTI):     1.05 cm AV Vmax:           316.00 cm/s AV Vmean:          221.000 cm/s AV VTI:            0.629 m AV Peak Grad:      39.9 mmHg AV Mean Grad:      22.0 mmHg LVOT Vmax:         103.50 cm/s LVOT Vmean:        69.700 cm/s LVOT VTI:          0.232 m LVOT/AV VTI ratio: 0.37  AORTA Ao Root diam: 2.80 cm Ao Asc diam:  2.60 cm MITRAL VALVE MV Area (PHT): 3.70 cm     SHUNTS MV Area VTI:   1.51 cm     Systemic VTI:  0.23 m MV Peak grad:  14.3 mmHg    Systemic Diam: 1.90 cm MV Mean grad:  6.3 mmHg MV Vmax:       1.89 m/s MV Vmean:      115.7 cm/s MV Decel Time: 205 msec MV E velocity: 183.00 cm/s MV A velocity: 114.00 cm/s MV E/A ratio:  1.61 Dorris Carnes MD Electronically signed by Dorris Carnes MD Signature Date/Time: 03/10/2021/1:12:21 PM    Final     Scheduled Meds:  apixaban  2.5 mg Oral BID   Chlorhexidine Gluconate Cloth  6 each Topical Daily   cholecalciferol  1,000 Units Oral Daily   cloNIDine  0.2 mg Oral BID   diltiazem  60 mg Oral Q8H   furosemide  40 mg Intravenous Daily   guaiFENesin  600 mg Oral BID   hydrALAZINE  50 mg Oral TID   insulin aspart  0-5 Units Subcutaneous QHS   insulin aspart  0-9 Units Subcutaneous TID WC   isosorbide mononitrate  30 mg Oral Daily   mouth rinse  15 mL Mouth Rinse BID   mometasone-formoterol  2 puff Inhalation BID   predniSONE  40 mg Oral Q breakfast   Continuous Infusions:   LOS: 1 day    Time spent: 50 mins    Shawna Clamp, MD [facility]   If 7PM-7AM, please contact night-coverage

## 2021-03-11 NOTE — Plan of Care (Signed)
°  Problem: Education: °Goal: Knowledge of General Education information will improve °Description: Including pain rating scale, medication(s)/side effects and non-pharmacologic comfort measures °Outcome: Progressing °  °Problem: Health Behavior/Discharge Planning: °Goal: Ability to manage health-related needs will improve °Outcome: Progressing °  °Problem: Clinical Measurements: °Goal: Ability to maintain clinical measurements within normal limits will improve °Outcome: Progressing °Goal: Will remain free from infection °Outcome: Progressing °Goal: Diagnostic test results will improve °Outcome: Progressing °Goal: Respiratory complications will improve °Outcome: Progressing °Goal: Cardiovascular complication will be avoided °Outcome: Progressing °  °Problem: Activity: °Goal: Risk for activity intolerance will decrease °Outcome: Progressing °  °Problem: Coping: °Goal: Level of anxiety will decrease °Outcome: Progressing °  °Problem: Elimination: °Goal: Will not experience complications related to bowel motility °Outcome: Progressing °  °Problem: Pain Managment: °Goal: General experience of comfort will improve °Outcome: Progressing °  °Problem: Safety: °Goal: Ability to remain free from injury will improve °Outcome: Progressing °  °Problem: Skin Integrity: °Goal: Risk for impaired skin integrity will decrease °Outcome: Progressing °  °

## 2021-03-11 NOTE — Progress Notes (Signed)
Caregiver [name] at bedside. [name] stated she was taking the patients watch home for her.

## 2021-03-12 DIAGNOSIS — I4891 Unspecified atrial fibrillation: Secondary | ICD-10-CM | POA: Diagnosis not present

## 2021-03-12 DIAGNOSIS — J441 Chronic obstructive pulmonary disease with (acute) exacerbation: Secondary | ICD-10-CM

## 2021-03-12 DIAGNOSIS — I160 Hypertensive urgency: Secondary | ICD-10-CM | POA: Diagnosis not present

## 2021-03-12 DIAGNOSIS — I248 Other forms of acute ischemic heart disease: Secondary | ICD-10-CM | POA: Diagnosis not present

## 2021-03-12 LAB — CBC
HCT: 23.2 % — ABNORMAL LOW (ref 36.0–46.0)
Hemoglobin: 7.5 g/dL — ABNORMAL LOW (ref 12.0–15.0)
MCH: 32.6 pg (ref 26.0–34.0)
MCHC: 32.3 g/dL (ref 30.0–36.0)
MCV: 100.9 fL — ABNORMAL HIGH (ref 80.0–100.0)
Platelets: 240 10*3/uL (ref 150–400)
RBC: 2.3 MIL/uL — ABNORMAL LOW (ref 3.87–5.11)
RDW: 15.2 % (ref 11.5–15.5)
WBC: 12.7 10*3/uL — ABNORMAL HIGH (ref 4.0–10.5)
nRBC: 0 % (ref 0.0–0.2)

## 2021-03-12 LAB — GLUCOSE, CAPILLARY
Glucose-Capillary: 250 mg/dL — ABNORMAL HIGH (ref 70–99)
Glucose-Capillary: 319 mg/dL — ABNORMAL HIGH (ref 70–99)
Glucose-Capillary: 289 mg/dL — ABNORMAL HIGH (ref 70–99)
Glucose-Capillary: 258 mg/dL — ABNORMAL HIGH (ref 70–99)

## 2021-03-12 LAB — BASIC METABOLIC PANEL
Anion gap: 9 (ref 5–15)
BUN: 90 mg/dL — ABNORMAL HIGH (ref 8–23)
CO2: 32 mmol/L (ref 22–32)
Calcium: 10.3 mg/dL (ref 8.9–10.3)
Chloride: 91 mmol/L — ABNORMAL LOW (ref 98–111)
Creatinine, Ser: 2.83 mg/dL — ABNORMAL HIGH (ref 0.44–1.00)
GFR, Estimated: 15 mL/min — ABNORMAL LOW (ref >60)
Glucose, Bld: 270 mg/dL — ABNORMAL HIGH (ref 70–99)
Potassium: 4.6 mmol/L (ref 3.5–5.1)
Sodium: 132 mmol/L — ABNORMAL LOW (ref 135–145)

## 2021-03-12 LAB — PHOSPHORUS: Phosphorus: 4.5 mg/dL (ref 2.5–4.6)

## 2021-03-12 LAB — HEMOGLOBIN AND HEMATOCRIT, BLOOD
HCT: 23.6 % — ABNORMAL LOW (ref 36.0–46.0)
Hemoglobin: 7.8 g/dL — ABNORMAL LOW (ref 12.0–15.0)

## 2021-03-12 LAB — MAGNESIUM: Magnesium: 2.7 mg/dL — ABNORMAL HIGH (ref 1.7–2.4)

## 2021-03-12 MED ORDER — SENNOSIDES-DOCUSATE SODIUM 8.6-50 MG PO TABS
1.0000 | ORAL_TABLET | Freq: Two times a day (BID) | ORAL | Status: DC
  Administered 2021-03-12 – 2021-03-15 (×8): 1 via ORAL
  Filled 2021-03-12 (×8): qty 1

## 2021-03-12 NOTE — Consult Note (Signed)
Cardiology Consultation:   Patient ID: Sabrina [name] MRN: [id number]; DOB: 1926-12-18  Admit date: 03/09/2021 Date of Consult: 03/12/2021  PCP:  [name], MD   [facility] Providers Cardiologist:  Mertie Moores, MD  Electrophysiologist:  [name], MD       Patient Profile:   Sabrina [name] is a 88 y.o. female with a hx of breast CA, chemo induced DVT, CKD stage IV, HTN, HLD, DM II, chronic diastolic heart failure, COPD on 3 L nasal cannula 24/7, CVA, PAF seen on loop recorder, and history of dementia who is being seen 03/12/2021 for the evaluation of elevated troponin at the request of Dr. Dwyane Dee.  History of Present Illness:   Sabrina [name] is a 88 yo female with PMH of breast CA, chemo induced DVT, CKD stage IV, HTN, HLD, DM II, chronic diastolic heart failure, COPD on 3 L nasal cannula 24/7, CVA, PAF seen on loop recorder, and history of dementia.  Patient underwent loop recorder implantation by Dr. Lovena Le on 08/15/2017 after cryptogenic stroke.  She was found to have rare PAF.  She is on 2.5 mg twice a day of Eliquis.  Her medication is managed by home health aide.  She has a home health aide that goes to her home from 8 PM until 8 AM.  Her home health aide administer her medications therefore she has been compliant with her home medication recently.  Last echocardiogram obtained on 12/19/2020 showed EF 60 to 65%, grade 1 DD, RVSP 39.6 mmHg, severe LAE, mild to moderate mitral stenosis, mild AI.  Based on the last few device interrogations, her loop recorder suggest her A. fib burden is close to 0%.  Patient was admitted to [facility] on 03/09/2021 after complaining of shortness of breath and cough for 1 day.  She was administered breathing treatment however went into atrial fibrillation with RVR.  On arrival, she was also in hypertensive urgency with systolic blood pressure in the 230s.  Her serial troponin were mildly elevated at 27-->58-->143-->169.  Talking with the  patient, she denies any recent chest discomfort.  She has quite significant dementia and replies "I don't know" for most of the questions.  However she is able to recall the year, the location, who is the president and herself.  There was also some suspicion of acute heart failure as well on top of COPD exacerbation.  Patient has been treated with steroid therapy.  She was also placed on Cardizem 60 mg every 8 hours for rate control.  She has since converted back to sinus rhythm.  Blood pressure is also very well controlled.  Cardiology service has been consulted for elevated troponin.  Repeat echocardiogram obtained on 03/10/2021 showed EF 60 to 65%, mild to moderate MS, trivial MR, moderate aortic stenosis, mild AI.   Past Medical History:  Diagnosis Date   Arthritis    "mostly in my hands, lower back" (06/25/2017)   Basal cell carcinoma (BCC) of face 1983   Breast cancer, right breast ([place]) 03/2013   Chronic kidney disease (CKD), stage III (moderate) ([place])    nephrologist, Dr. [name]   Dental crowns present    DVT (deep venous thrombosis) ([place]) ~ 06/2013   "? side"   Gout    "on daily RX" (06/25/2017)   Heart murmur    no known problems; states did not know she had murmur until age 37   High cholesterol    Hypertension    fluctuates, especially when stressed;  has been on med. > 20 yr.   Immature cataract    Non-insulin dependent type 2 diabetes mellitus ([place])    Personal history of chemotherapy    Personal history of radiation therapy    Pulmonary embolism ([place]) ~ 06/2013   Radiation 09/06/13-10/20/13   Right Breast Cancer   Stroke ([place]) 05/2017   just visual problems since (06/25/2017)   Wears partial dentures    lower    Past Surgical History:  Procedure Laterality Date   AXILLARY LYMPH NODE DISSECTION Right 03/30/2013   Procedure: AXILLARY LYMPH NODE DISSECTION;  Surgeon: Rolm Bookbinder, MD;  Location: [facility] [employer];  Service: General;  Laterality: Right;   BASAL CELL  CARCINOMA EXCISION  1983   "face"   BREAST BIOPSY Right 03/17/2013   BREAST CYST EXCISION Right 11/1958   benign   BREAST LUMPECTOMY Right 03/30/2013   BREAST LUMPECTOMY WITH NEEDLE LOCALIZATION Right 03/30/2013   Procedure: BREAST LUMPECTOMY WITH NEEDLE LOCALIZATION;  Surgeon: Rolm Bookbinder, MD;  Location: [place];  Service: General;  Laterality: Right;   DILATION AND CURETTAGE OF UTERUS     LOOP RECORDER INSERTION N/A 08/15/2017   Procedure: LOOP RECORDER INSERTION;  Surgeon: Evans Lance, MD;  Location: [place];  Service: Cardiovascular;  Laterality: N/A;   PORT-A-CATH REMOVAL  2016   PORTACATH PLACEMENT N/A 04/15/2013   Procedure: INSERTION PORT-A-CATH;  Surgeon: Rolm Bookbinder, MD;  Location: [place];  Service: General;  Laterality: N/A;   RE-EXCISION OF BREAST CANCER,SUPERIOR MARGINS Right 04/15/2013   Procedure: RE-EXCISION OF RIGHT BREAST  MARGINS;  Surgeon: Rolm Bookbinder, MD;  Location: [place];  Service: General;  Laterality: Right;   TONSILLECTOMY  ~ 1935/1936     Home Medications:  Prior to Admission medications   Medication Sig Start Date End Date Taking? Authorizing Provider  acetaminophen (TYLENOL) 500 MG tablet Take 500 mg by mouth daily as needed for fever or moderate pain (for foot pain).   Yes [provider]  albuterol (VENTOLIN HFA) 108 (90 Base) MCG/ACT inhaler Inhale 1-2 puffs into the lungs every 6 (six) hours as needed for wheezing or shortness of breath. 03/22/19  Yes [provider]  allopurinol (ZYLOPRIM) 100 MG tablet Take 100 mg by mouth daily. 01/17/16  Yes [provider]  amLODipine (NORVASC) 2.5 MG tablet Take 2.5 mg by mouth daily.   Yes [provider]  benzonatate (TESSALON) 100 MG capsule Take 1 capsule (100 mg total) by mouth every 8 (eight) hours. 03/04/21  Yes Domenic Moras, PA-C  cholecalciferol (VITAMIN D) 1000 UNITS tablet Take 1,000 Units by mouth daily.   Yes  [provider]  cloNIDine (CATAPRES) 0.2 MG tablet Take 0.2 mg by mouth 2 (two) times daily.   Yes [provider]  diltiazem (CARDIZEM) 60 MG tablet Take 1 tablet (60 mg total) by mouth every 8 (eight) hours. 02/13/21 03/15/21 Yes Arrien, Jimmy Picket, MD  doxycycline (VIBRA-TABS) 100 MG tablet Take 100 mg by mouth 2 (two) times daily.   Yes [provider]  ELIQUIS 2.5 MG TABS tablet TAKE 1 TABLET BY MOUTH TWICE DAILY. Patient taking differently: Take 2.5 mg by mouth 2 (two) times daily. 07/04/20  Yes Fenton, Clint R, PA  famotidine (PEPCID) 20 MG tablet Take 20 mg by mouth at bedtime as needed for indigestion or heartburn. 11/09/19  Yes [provider]  furosemide (LASIX) 40 MG tablet Take 40 mg by mouth daily. Per Dr. If over 166lb take  80mg  a day   Yes [provider]  gabapentin (NEURONTIN) 100 MG capsule Take 200-300 mg by mouth See admin instructions. 2 capsules bid and 3 capsules at bedtime   Yes [provider]  glipiZIDE (GLUCOTROL) 5 MG tablet Take 2.5-5 mg by mouth See admin instructions. Takes 1 tab every am and 1/2 tab every evening   Yes [provider]  hydrALAZINE (APRESOLINE) 50 MG tablet Take 50 mg by mouth 3 (three) times daily.   Yes [provider]  ipratropium-albuterol (DUONEB) 0.5-2.5 (3) MG/3ML SOLN Take 3 mLs by nebulization every 6 (six) hours as needed (wheezing and shortness of breath). 02/13/21  Yes Arrien, Jimmy Picket, MD  isosorbide mononitrate (IMDUR) 30 MG 24 hr tablet Take 1 tablet (30 mg total) by mouth daily. 12/22/20 02/07/22 Yes Pokhrel, Corrie Mckusick, MD  levalbuterol (XOPENEX) 0.63 MG/3ML nebulizer solution Take 0.63 mg by nebulization every 4 (four) hours as needed for wheezing or shortness of breath.   Yes [provider]  mometasone-formoterol (DULERA) 200-5 MCG/ACT AERO Inhale 2 puffs into the lungs 2 (two) times daily. 02/13/21  Yes Arrien, Jimmy Picket, MD  ondansetron  (ZOFRAN-ODT) 8 MG disintegrating tablet Take 8 mg by mouth 2 (two) times daily as needed for nausea or vomiting.   Yes [provider]  predniSONE (DELTASONE) 20 MG tablet Take 20 mg by mouth 2 (two) times daily with a meal.   Yes [provider]  TRUE METRIX BLOOD GLUCOSE TEST test strip  07/19/19   [provider]  TRUEplus Lancets 28G MISC Apply topically. 08/20/19   [provider]    Inpatient Medications: Scheduled Meds:  apixaban  2.5 mg Oral BID   Chlorhexidine Gluconate Cloth  6 each Topical Daily   cholecalciferol  1,000 Units Oral Daily   cloNIDine  0.2 mg Oral BID   diltiazem  60 mg Oral Q8H   furosemide  40 mg Intravenous Daily   guaiFENesin  600 mg Oral BID   hydrALAZINE  50 mg Oral TID   insulin aspart  0-5 Units Subcutaneous QHS   insulin aspart  0-9 Units Subcutaneous TID WC   isosorbide mononitrate  30 mg Oral Daily   mouth rinse  15 mL Mouth Rinse BID   mometasone-formoterol  2 puff Inhalation BID   predniSONE  40 mg Oral Q breakfast   Continuous Infusions:  PRN Meds: acetaminophen **OR** acetaminophen, famotidine, ipratropium-albuterol, labetalol, lip balm, morphine injection, ondansetron (ZOFRAN) IV  Allergies:    Allergies  Allergen Reactions   Beta Adrenergic Blockers Other (See Comments)    Fatigue    Social History:   Social History   Socioeconomic History   Marital status: Married    Spouse name: Not on file   Number of children: 0   Years of education: Not on file   Highest education level: Bachelor's degree (e.g., BA, AB, BS)  Occupational History   Not on file  Tobacco Use   Smoking status: Never   Smokeless tobacco: Never   Tobacco comments:    only smoked 2 packs cigarettes total; husband quit in 1971  Vaping Use   Vaping Use: Never used  Substance and Sexual Activity   Alcohol use: Not Currently    Alcohol/week: 3.0 standard drinks    Types: 3 Glasses of wine per week   Drug use: No   Sexual  activity: Not Currently  Other Topics Concern   Not on file  Social History Narrative   Lives at home  with her husband   Right handed   Caffeine: 1 coffee daily at most    Social Determinants of Health   Financial Resource Strain: Not on file  Food Insecurity: Not on file  Transportation Needs: Not on file  Physical Activity: Not on file  Stress: Not on file  Social Connections: Not on file  Intimate Partner Violence: Not on file    Family History:    Family History  Problem Relation Age of Onset   Pneumonia Mother    Heart attack Father    Breast cancer Other 27       niece   Breast cancer Other 10       niece   Ovarian cancer Other        niece     ROS:  Please see the history of present illness.   All other ROS reviewed and negative.     Physical Exam/Data:   Vitals:   03/12/21 0200 03/12/21 0300 03/12/21 0500 03/12/21 0801  BP:      Pulse: 61 71    Resp: 15 14    Temp:      TempSrc:      SpO2: 99% 100%  100%  Weight:   71.2 kg   Height:        Intake/Output Summary (Last 24 hours) at 03/12/2021 0807 Last data filed at 03/11/2021 1600 Gross per 24 hour  Intake --  Output 500 ml  Net -500 ml   Last 3 Weights 03/12/2021 03/11/2021 03/10/2021  Weight (lbs) 156 lb 15.5 oz 156 lb 8.4 oz 163 lb 9.3 oz  Weight (kg) 71.2 kg 71 kg 74.2 kg     Body mass index is 31.7 kg/m.  General:  Well nourished, well developed, in no acute distress. Slow to answer questions HEENT: normal Neck: no JVD Vascular: No carotid bruits; Distal pulses 2+ bilaterally Cardiac:  normal S1, S2; RRR; no murmur  Lungs:  clear to auscultation bilaterally, no wheezing, rhonchi or rales  Abd: soft, nontender, no hepatomegaly  Ext: no edema Musculoskeletal:  No deformities, BUE and BLE strength normal and equal Skin: warm and dry  Neuro:  CNs 2-12 intact, no focal abnormalities noted Psych:  Normal affect   EKG:  The EKG was personally reviewed and demonstrates:  atrial  fibrillation Telemetry:  Telemetry was personally reviewed and demonstrates:  NSR in the past 24 hours, occasional PACs  Relevant CV Studies:  Echo 03/10/2021 1. Left ventricular ejection fraction, by estimation, is 60 to 65%. The  left ventricle has normal function. The left ventricle has no regional  wall motion abnormalities.   2. Right ventricular systolic function is normal. The right ventricular  size is normal.   3. MV is thickened. Peakd and mean gradients through the valve are 13 and  6 mm Hg with MVA by VTI of 1.5 cm2 consistent with mild to moderate MS. No  significant change in gradients from report from 12/19/20. . The mitral  valve is degenerative. Trivial  mitral valve regurgitation. Moderate to severe mitral annular  calcification.   4. AV is thickened, calcified with restricted motion. Peak and mean  gradients through the valve are 49 and 27 mm HG respectively. AVA (VTI)  1.04 cm2 Dimensionless index is 0.37. Consistent with moderate AS. No  significant change from mean gradient from  12/19/20 (25 to 27 mm Hg). Aortic valve regurgitation is mild.   5. The inferior vena cava is dilated in size with <50% respiratory  variability, suggesting right atrial pressure of 15 mmHg.   Laboratory Data:  High Sensitivity Troponin:   Recent Labs  Lab 03/09/21 2124 03/09/21 2307 03/10/21 0800 03/10/21 0918  TROPONINIHS 27* 58* 143* 169*     Chemistry Recent Labs  Lab 03/10/21 0800 03/11/21 0240 03/12/21 0247  NA 132* 135 132*  K 4.3 4.2 4.6  CL 90* 92* 91*  CO2 27 33* 32  GLUCOSE 475* 299* 270*  BUN 72* 73* 90*  CREATININE 2.62* 2.61* 2.83*  CALCIUM 11.2* 11.1* 10.3  MG  --   --  2.7*  GFRNONAA 17* 17* 15*  ANIONGAP 15 10 9     Recent Labs  Lab 03/10/21 0800 03/11/21 0240  PROT 7.3 6.7  ALBUMIN 3.7 3.6  AST 17 15  ALT 17 14  ALKPHOS 92 75  BILITOT 1.1 0.7   Lipids No results for input(s): CHOL, TRIG, HDL, LABVLDL, LDLCALC, CHOLHDL in the last 168 hours.   Hematology Recent Labs  Lab 03/10/21 0800 03/11/21 0240 03/12/21 0247  WBC 10.1 13.5* 12.7*  RBC 2.84* 2.61* 2.30*  HGB 9.3* 8.4* 7.5*  HCT 28.4* 26.5* 23.2*  MCV 100.0 101.5* 100.9*  MCH 32.7 32.2 32.6  MCHC 32.7 31.7 32.3  RDW 15.3 15.3 15.2  PLT 285 277 240   Thyroid No results for input(s): TSH, FREET4 in the last 168 hours.  BNP Recent Labs  Lab 03/09/21 2124  BNP 426.7*    DDimer  Recent Labs  Lab 03/10/21 0800  DDIMER 0.27     Radiology/Studies:  DG Chest Port 1 View  Result Date: 03/09/2021 CLINICAL DATA:  Shortness of breath. EXAM: PORTABLE CHEST 1 VIEW COMPARISON:  Radiograph 5 days ago 03/04/2021 chest CT 02/11/2021 FINDINGS: Patient's chin obscures the apices.The cardiomediastinal contours are unchanged. Some of the previous question left pulmonary nodules are again seen, some are obscured on the current exam. Planted loop recorder in the left chest wall. Pulmonary vasculature is normal. No consolidation, pleural effusion, or pneumothorax. No acute osseous abnormalities are seen. Surgical clips in the right axilla. IMPRESSION: 1. No acute abnormality. 2. Some of the previously questioned tiny left pulmonary nodules are again seen. Electronically Signed   By: Keith Rake M.D.   On: 03/09/2021 22:58   ECHOCARDIOGRAM COMPLETE  Result Date: 03/10/2021    ECHOCARDIOGRAM REPORT   Patient Name:   Sabrina [name] [facility] Date of Exam: 03/10/2021 Medical Rec #:  [id number]          Height:       59.0 in Accession #:    [id number]         Weight:       163.6 lb Date of Birth:  Apr 19, 1926          BSA:          1.693 m Patient Age:    63 years           BP:           169/48 mmHg Patient Gender: F                  HR:           88 bpm. Exam Location:  Inpatient Procedure: 2D Echo, Cardiac Doppler, Color Doppler and Intracardiac            Opacification Agent Indications:    Elevated Troponin  History:        Patient has prior history of Echocardiogram examinations, most  recent 12/19/2020. Pacemaker, COPD and Stroke, Arrythmias:Atrial                 Fibrillation, Signs/Symptoms:Edema; Risk Factors:Diabetes and                 Hypertension.  Sonographer:    Glo Herring Referring Phys: [id number] [place]  1. Left ventricular ejection fraction, by estimation, is 60 to 65%. The left ventricle has normal function. The left ventricle has no regional wall motion abnormalities.  2. Right ventricular systolic function is normal. The right ventricular size is normal.  3. MV is thickened. Peakd and mean gradients through the valve are 13 and 6 mm Hg with MVA by VTI of 1.5 cm2 consistent with mild to moderate MS. No significant change in gradients from report from 12/19/20. . The mitral valve is degenerative. Trivial mitral valve regurgitation. Moderate to severe mitral annular calcification.  4. AV is thickened, calcified with restricted motion. Peak and mean gradients through the valve are 49 and 27 mm HG respectively. AVA (VTI) 1.04 cm2 Dimensionless index is 0.37. Consistent with moderate AS. No significant change from mean gradient from 12/19/20 (25 to 27 mm Hg). Aortic valve regurgitation is mild.  5. The inferior vena cava is dilated in size with <50% respiratory variability, suggesting right atrial pressure of 15 mmHg. FINDINGS  Left Ventricle: Left ventricular ejection fraction, by estimation, is 60 to 65%. The left ventricle has normal function. The left ventricle has no regional wall motion abnormalities. The left ventricular internal cavity size was normal in size. Right Ventricle: The right ventricular size is normal. Right vetricular wall thickness was not assessed. Right ventricular systolic function is normal. Right Atrium: Right atrial size was normal in size. Pericardium: There is no evidence of pericardial effusion. Mitral Valve: MV is thickened. Peakd and mean gradients through the valve are 13 and 6 mm Hg with MVA by VTI of 1.5 cm2 consistent with  mild to moderate MS. No significant change in gradients from report from 12/19/20. The mitral valve is degenerative in  appearance. Moderate to severe mitral annular calcification. Trivial mitral valve regurgitation. MV peak gradient, 14.3 mmHg. The mean mitral valve gradient is 6.3 mmHg. Tricuspid Valve: The tricuspid valve is normal in structure. Tricuspid valve regurgitation is mild. Aortic Valve: AV is thickened, calcified with restricted motion. Peak and mean gradients through the valve are 49 and 27 mm HG respectively. AVA (VTI) 1.04 cm2 Dimensionless index is 0.37. Consistent with moderate AS. No significant change from mean gradient from 12/19/20 (25 to 27 mm Hg). Aortic valve regurgitation is mild. Aortic valve mean gradient measures 22.0 mmHg. Aortic valve peak gradient measures 39.9 mmHg. Aortic valve area, by VTI measures 1.05 cm. Pulmonic Valve: The pulmonic valve was not well visualized. Pulmonic valve regurgitation is mild. No evidence of pulmonic stenosis. Aorta: The aortic root and ascending aorta are structurally normal, with no evidence of dilitation. Venous: The inferior vena cava is dilated in size with less than 50% respiratory variability, suggesting right atrial pressure of 15 mmHg. IAS/Shunts: The interatrial septum was not assessed.  LEFT VENTRICLE PLAX 2D LVOT diam:     1.90 cm LV SV:         66 LV SV Index:   39 LVOT Area:     2.84 cm  RIGHT VENTRICLE             IVC RV Basal diam:  4.20 cm     IVC diam: 2.60 cm RV  S prime:     15.90 cm/s RIGHT ATRIUM           Index RA Area:     12.10 cm RA Volume:   29.20 ml  17.24 ml/m  AORTIC VALVE                     PULMONIC VALVE AV Area (Vmax):    0.93 cm      PV Vmax:       1.33 m/s AV Area (Vmean):   0.89 cm      PV Peak grad:  7.1 mmHg AV Area (VTI):     1.05 cm AV Vmax:           316.00 cm/s AV Vmean:          221.000 cm/s AV VTI:            0.629 m AV Peak Grad:      39.9 mmHg AV Mean Grad:      22.0 mmHg LVOT Vmax:         103.50 cm/s  LVOT Vmean:        69.700 cm/s LVOT VTI:          0.232 m LVOT/AV VTI ratio: 0.37  AORTA Ao Root diam: 2.80 cm Ao Asc diam:  2.60 cm MITRAL VALVE MV Area (PHT): 3.70 cm     SHUNTS MV Area VTI:   1.51 cm     Systemic VTI:  0.23 m MV Peak grad:  14.3 mmHg    Systemic Diam: 1.90 cm MV Mean grad:  6.3 mmHg MV Vmax:       1.89 m/s MV Vmean:      115.7 cm/s MV Decel Time: 205 msec MV E velocity: 183.00 cm/s MV A velocity: 114.00 cm/s MV E/A ratio:  1.61 Dorris Carnes MD Electronically signed by Dorris Carnes MD Signature Date/Time: 03/10/2021/1:12:21 PM    Final      Assessment and Plan:   Elevated troponin: She denies any chest pain.  Echo obtained on 03/10/2021 showed EF 60 to 65%, no regional wall motion abnormality.  Elevated troponin likely due to combination of hypertensive urgency, atrial fibrillation and COPD exacerbation.  Given normal EF and her advanced age, would not recommend any further work-up.  Paroxysmal atrial fibrillation: On Eliquis 2.5 mg twice a day.  She has extremely low A. fib burden based on the previous loop recorder interrogation, A. fib burden at home as close to 0%.  She had recurrence of A. fib in the setting of breathing treatment use and COPD exacerbation, she has since completed back to normal sinus rhythm and maintaining sinus rhythm for the past 24 hours.  Heart rate is currently very well controlled.  Continue home dose of Cardizem 60 mg every 8 hours.  Acute on chronic respiratory failure/COPD exacerbation: Receiving steroid therapy. Although there is some concern that she might be mildly volume overloaded, however she appears to be euvolemic on my physical examination.  Creatinine is trending up.  May switch to IV Lasix back to her home dose of 40 mg p.o.  Hypertensive urgency: Occurred in the setting of COPD exacerbation.  She has been restarted home dose of Cardizem, hydralazine and Imdur.  Home dose of amlodipine 2.5 mg daily is currently held, however may restart at any  time.  Hyperlipidemia: Not on statin at home.  DM2: Managed by primary care provider  Acute on chronic renal insufficiency: Her baseline creatinine is about 2.1-2.6,  she arrived with creatinine of 2.53, creatinine slowly trended up to 2.83 today.   Risk Assessment/Risk Scores:       CHA2DS2-VASc Score = 7   This indicates a 11.2% annual risk of stroke. The patient's score is based upon: CHF History: 0 HTN History: 1 Diabetes History: 1 Stroke History: 2 Vascular Disease History: 0 Age Score: 2 Gender Score: 1         For questions or updates, please contact [place] Please consult [email or web address] for contact info under    Hilbert Corrigan, [place]  03/12/2021 8:07 AM

## 2021-03-12 NOTE — Progress Notes (Signed)
No urine produced since last in and out cath. Bladder scan performed reading 188 mL. Will have nightshift reassess bladder volume and two hours.

## 2021-03-12 NOTE — Progress Notes (Signed)
PROGRESS NOTE    Sabrina [name]  OIN:[id number] DOB: 1926/07/07 DOA: 03/09/2021 PCP: [name], MD   Brief Narrative:  This 88 years old female with PMH significant for A. Fibrillation, DVT on Eliquis, CKD 4, DM 2, hypertension, chronic diastolic heart failure, chronic hypoxic respiratory failure on 3 L of supplemental oxygen at baseline, COPD presented in the ED with shortness of breath.  History is obtained from ED chart since patient has baseline dementia.  Patient was found to have significant wheezing and was given breathing treatment, she went into A. fib with RVR.  She was given IV Cardizem and started on nitro gtt. for hypertensive urgency. Patient is admitted for COPD and possible CHF exacerbation.  Cardiology consulted for elevated troponin,  recommended to continue current management,  no plan for any ischemic work-up at this point.  Assessment & Plan:   Principal Problem:   COPD exacerbation ([place]) Active Problems:   Atrial fibrillation with RVR ([place])  Hypertensive urgency:> Resolved. She was severely hypertensive.  BP 231/75 on arrival. Patient was started on nitro gtt., Blood pressure has improved. She was resumed on her blood pressure medications. Nitro gtt. discontinued. Continue hydralazine, Imdur, Cardizem, clonidine, Lasix.  Atrial fibrillation with RVR: Heart rate is now improved. Continue Cardizem 60 mg every 8 hours,  Continue Eliquis.  COPD exacerbation: Chronic hypoxic respiratory failure: Patient remains on her baseline oxygen requirement. COVID, influenza negative.  Chest x-ray unremarkable. Continue steroids and nebulized bronchodilators.  CKD stage IV: Serum creatinine at baseline. Avoid nephrotoxic medications  Type 2 diabetes: Carb modified diet, regular insulin sliding scale. Hemoglobin A1c 8.8.  Hypercalcemia: Continue IV hydration.  Monitor serum calcium. Serum calcium improves.  Elevated troponin: Acute on chronic diastolic  CHF: Patient denies any chest pain,  it could be due to demand ischemia. 2D echocardiogram unremarkable.  LVEF 55 to 60%. Continue Lasix 40 mg IV daily. Cardiology consulted.  Recommended no ischemic work-up needed. Elevated troponin could be due to demand ischemia.  Macrocytic anemia: H&H is stable, no evidence of bleeding.  DVT prophylaxis: Eliquis Code Status:DNR Family Communication: Caretaker at bed side. Disposition Plan:   Status is: Inpatient  Remains inpatient appropriate because:   Admitted for CHF and COPD exacerbation. Hospital course complicated by A. fib with RVR requiring IV Cardizem.  Anticipated discharge in 1 to 2 days.   Consultants:  Cardiology  Procedures: Echocardiogram  Antimicrobials: None.  Subjective: Patient was seen and examined at bedside. Overnight events noted.   Patient reports feeling much improved.  She remains at her baseline. Her heart rate is controlled, denies any chest pain or shortness of breath.  Objective: Vitals:   03/12/21 0800 03/12/21 0801 03/12/21 0821 03/12/21 0916  BP: (!) 135/36  (!) 136/35   Pulse: 66   70  Resp: 17   (!) 21  Temp:  97.6 F (36.4 C)    TempSrc:  Oral    SpO2: 99% 100%  98%  Weight:      Height:        Intake/Output Summary (Last 24 hours) at 03/12/2021 1057 Last data filed at 03/11/2021 1600 Gross per 24 hour  Intake --  Output 500 ml  Net -500 ml   Filed Weights   03/10/21 0400 03/11/21 0500 03/12/21 0500  Weight: 74.2 kg 71 kg 71.2 kg    Examination:  General exam: Appears comfortable, deconditioned, not in any distress. Respiratory system: Clear to auscultation bilaterally, respiratory effort normal, RR 15 Cardiovascular system: S1-S2 heard, regular  rate and rhythm, murmur+ Gastrointestinal system: Abdomen is soft, distended, nontender, BS+ Central nervous system: Alert and oriented X 2. No focal neurological deficits. Extremities: No edema, no cyanosis, no clubbing. Skin: No  rashes, lesions or ulcers Psychiatry:  Mood & affect appropriate.     Data Reviewed: I have personally reviewed following labs and imaging studies  CBC: Recent Labs  Lab 03/09/21 2124 03/10/21 0800 03/11/21 0240 03/12/21 0247 03/12/21 0919  WBC 9.8 10.1 13.5* 12.7*  --   NEUTROABS 5.5 8.6*  --   --   --   HGB 9.2* 9.3* 8.4* 7.5* 7.8*  HCT 29.5* 28.4* 26.5* 23.2* 23.6*  MCV 100.7* 100.0 101.5* 100.9*  --   PLT 279 285 277 240  --    Basic Metabolic Panel: Recent Labs  Lab 03/09/21 2124 03/10/21 0800 03/11/21 0240 03/12/21 0247  NA 132* 132* 135 132*  K 3.7 4.3 4.2 4.6  CL 91* 90* 92* 91*  CO2 29 27 33* 32  GLUCOSE 291* 475* 299* 270*  BUN 68* 72* 73* 90*  CREATININE 2.53* 2.62* 2.61* 2.83*  CALCIUM 11.0* 11.2* 11.1* 10.3  MG  --   --   --  2.7*  PHOS  --   --   --  4.5   GFR: Estimated Creatinine Clearance: 10.9 mL/min (A) (by C-G formula based on SCr of 2.83 mg/dL (H)). Liver Function Tests: Recent Labs  Lab 03/10/21 0800 03/11/21 0240  AST 17 15  ALT 17 14  ALKPHOS 92 75  BILITOT 1.1 0.7  PROT 7.3 6.7  ALBUMIN 3.7 3.6   No results for input(s): LIPASE, AMYLASE in the last 168 hours. No results for input(s): AMMONIA in the last 168 hours. Coagulation Profile: No results for input(s): INR, PROTIME in the last 168 hours. Cardiac Enzymes: No results for input(s): CKTOTAL, CKMB, CKMBINDEX, TROPONINI in the last 168 hours. BNP (last 3 results) No results for input(s): PROBNP in the last 8760 hours. HbA1C: Recent Labs    03/10/21 0800  HGBA1C 8.8*   CBG: Recent Labs  Lab 03/11/21 0747 03/11/21 1158 03/11/21 1610 03/11/21 2050 03/12/21 0733  GLUCAP 313* 326* 291* 292* 250*   Lipid Profile: No results for input(s): CHOL, HDL, LDLCALC, TRIG, CHOLHDL, LDLDIRECT in the last 72 hours. Thyroid Function Tests: No results for input(s): TSH, T4TOTAL, FREET4, T3FREE, THYROIDAB in the last 72 hours. Anemia Panel: No results for input(s): VITAMINB12,  FOLATE, FERRITIN, TIBC, IRON, RETICCTPCT in the last 72 hours. Sepsis Labs: No results for input(s): PROCALCITON, LATICACIDVEN in the last 168 hours.  Recent Results (from the past 240 hour(s))  Resp Panel by RT-PCR (Flu A&B, Covid) Nasopharyngeal Swab     Status: None   Collection Time: 03/04/21  9:55 AM   Specimen: Nasopharyngeal Swab; Nasopharyngeal(NP) swabs in vial transport medium  Result Value Ref Range Status   SARS Coronavirus 2 by RT PCR NEGATIVE NEGATIVE Final    Comment: (NOTE) SARS-CoV-2 target nucleic acids are NOT DETECTED.  The SARS-CoV-2 RNA is generally detectable in upper respiratory specimens during the acute phase of infection. The lowest concentration of SARS-CoV-2 viral copies this assay can detect is 138 copies/mL. A negative result does not preclude SARS-Cov-2 infection and should not be used as the sole basis for treatment or other patient management decisions. A negative result may occur with  improper specimen collection/handling, submission of specimen other than nasopharyngeal swab, presence of viral mutation(s) within the areas targeted by this assay, and inadequate number of viral copies(<138  copies/mL). A negative result must be combined with clinical observations, patient history, and epidemiological information. The expected result is Negative.  Fact Sheet for Patients:  EntrepreneurPulse.com.au  Fact Sheet for Healthcare Providers:  IncredibleEmployment.be  This test is no t yet approved or cleared by the [place] FDA and  has been authorized for detection and/or diagnosis of SARS-CoV-2 by FDA under an Emergency Use Authorization (EUA). This EUA will remain  in effect (meaning this test can be used) for the duration of the COVID-19 declaration under Section 564(b)(1) of the Act, 21 U.S.C.section 360bbb-3(b)(1), unless the authorization is terminated  or revoked sooner.       Influenza A by PCR  NEGATIVE NEGATIVE Final   Influenza B by PCR NEGATIVE NEGATIVE Final    Comment: (NOTE) The Xpert Xpress SARS-CoV-2/FLU/RSV plus assay is intended as an aid in the diagnosis of influenza from Nasopharyngeal swab specimens and should not be used as a sole basis for treatment. Nasal washings and aspirates are unacceptable for Xpert Xpress SARS-CoV-2/FLU/RSV testing.  Fact Sheet for Patients: EntrepreneurPulse.com.au  Fact Sheet for Healthcare Providers: IncredibleEmployment.be  This test is not yet approved or cleared by the [place] FDA and has been authorized for detection and/or diagnosis of SARS-CoV-2 by FDA under an Emergency Use Authorization (EUA). This EUA will remain in effect (meaning this test can be used) for the duration of the COVID-19 declaration under Section 564(b)(1) of the Act, 21 U.S.C. section 360bbb-3(b)(1), unless the authorization is terminated or revoked.  Performed at [facility], [place] [address], [place], [place] [address]   Resp Panel by RT-PCR (Flu A&B, Covid) Nasopharyngeal Swab     Status: None   Collection Time: 03/09/21  9:40 PM   Specimen: Nasopharyngeal Swab; Nasopharyngeal(NP) swabs in vial transport medium  Result Value Ref Range Status   SARS Coronavirus 2 by RT PCR NEGATIVE NEGATIVE Final    Comment: (NOTE) SARS-CoV-2 target nucleic acids are NOT DETECTED.  The SARS-CoV-2 RNA is generally detectable in upper respiratory specimens during the acute phase of infection. The lowest concentration of SARS-CoV-2 viral copies this assay can detect is 138 copies/mL. A negative result does not preclude SARS-Cov-2 infection and should not be used as the sole basis for treatment or other patient management decisions. A negative result may occur with  improper specimen collection/handling, submission of specimen other than nasopharyngeal swab, presence of viral mutation(s) within the areas  targeted by this assay, and inadequate number of viral copies(<138 copies/mL). A negative result must be combined with clinical observations, patient history, and epidemiological information. The expected result is Negative.  Fact Sheet for Patients:  EntrepreneurPulse.com.au  Fact Sheet for Healthcare Providers:  IncredibleEmployment.be  This test is no t yet approved or cleared by the [place] FDA and  has been authorized for detection and/or diagnosis of SARS-CoV-2 by FDA under an Emergency Use Authorization (EUA). This EUA will remain  in effect (meaning this test can be used) for the duration of the COVID-19 declaration under Section 564(b)(1) of the Act, 21 U.S.C.section 360bbb-3(b)(1), unless the authorization is terminated  or revoked sooner.       Influenza A by PCR NEGATIVE NEGATIVE Final   Influenza B by PCR NEGATIVE NEGATIVE Final    Comment: (NOTE) The Xpert Xpress SARS-CoV-2/FLU/RSV plus assay is intended as an aid in the diagnosis of influenza from Nasopharyngeal swab specimens and should not be used as a sole basis for treatment. Nasal washings and aspirates are unacceptable for Xpert  Xpress SARS-CoV-2/FLU/RSV testing.  Fact Sheet for Patients: EntrepreneurPulse.com.au  Fact Sheet for Healthcare Providers: IncredibleEmployment.be  This test is not yet approved or cleared by the [place] FDA and has been authorized for detection and/or diagnosis of SARS-CoV-2 by FDA under an Emergency Use Authorization (EUA). This EUA will remain in effect (meaning this test can be used) for the duration of the COVID-19 declaration under Section 564(b)(1) of the Act, 21 U.S.C. section 360bbb-3(b)(1), unless the authorization is terminated or revoked.  Performed at [facility], [place] [address], [place], [place] [address]   Respiratory (~20 pathogens) panel by PCR     Status:  None   Collection Time: 03/10/21  3:11 PM   Specimen: Nasopharyngeal Swab; Respiratory  Result Value Ref Range Status   Adenovirus NOT DETECTED NOT DETECTED Final   Coronavirus 229E NOT DETECTED NOT DETECTED Final    Comment: (NOTE) The Coronavirus on the Respiratory Panel, DOES NOT test for the novel  Coronavirus (2019 nCoV)    Coronavirus HKU1 NOT DETECTED NOT DETECTED Final   Coronavirus NL63 NOT DETECTED NOT DETECTED Final   Coronavirus OC43 NOT DETECTED NOT DETECTED Final   Metapneumovirus NOT DETECTED NOT DETECTED Final   Rhinovirus / Enterovirus NOT DETECTED NOT DETECTED Final   Influenza A NOT DETECTED NOT DETECTED Final   Influenza B NOT DETECTED NOT DETECTED Final   Parainfluenza Virus 1 NOT DETECTED NOT DETECTED Final   Parainfluenza Virus 2 NOT DETECTED NOT DETECTED Final   Parainfluenza Virus 3 NOT DETECTED NOT DETECTED Final   Parainfluenza Virus 4 NOT DETECTED NOT DETECTED Final   Respiratory Syncytial Virus NOT DETECTED NOT DETECTED Final   Bordetella pertussis NOT DETECTED NOT DETECTED Final   Bordetella Parapertussis NOT DETECTED NOT DETECTED Final   Chlamydophila pneumoniae NOT DETECTED NOT DETECTED Final   Mycoplasma pneumoniae NOT DETECTED NOT DETECTED Final    Comment: Performed at [facility], [place]. [address], [place], [place] [address]   Radiology Studies: No results found.  Scheduled Meds:  apixaban  2.5 mg Oral BID   Chlorhexidine Gluconate Cloth  6 each Topical Daily   cholecalciferol  1,000 Units Oral Daily   cloNIDine  0.2 mg Oral BID   diltiazem  60 mg Oral Q8H   furosemide  40 mg Intravenous Daily   guaiFENesin  600 mg Oral BID   hydrALAZINE  50 mg Oral TID   insulin aspart  0-5 Units Subcutaneous QHS   insulin aspart  0-9 Units Subcutaneous TID WC   isosorbide mononitrate  30 mg Oral Daily   mouth rinse  15 mL Mouth Rinse BID   mometasone-formoterol  2 puff Inhalation BID   predniSONE  40 mg Oral Q breakfast   senna-docusate  1  tablet Oral BID   Continuous Infusions:   LOS: 2 days    Time spent: 35 mins    Morad Tal, MD [facility]   If 7PM-7AM, please contact night-coverage

## 2021-03-12 NOTE — Progress Notes (Signed)
[id number]   [facility]) Hospital Liaison Note  This is a pending outpatient based palliative care patient with [facility].  [facility] hospital liaison will continue to follow for discharge disposition.   Please call for any outpatient based outpatient palliative care related questions or concerns.   Thank you.   Buck Mam [facility] Liaison [phone]

## 2021-03-12 NOTE — Progress Notes (Signed)
Patient has not voided in over 16 hours. Nightshift report bladder scans to read zero. Bladder scanned at 1230. Reading 329. I&O cath performed. 600 mL of clear, yellow urine returned. MD notified. Instructed to place foley catheter at next I&O.

## 2021-03-13 LAB — BASIC METABOLIC PANEL
Anion gap: 10 (ref 5–15)
BUN: 100 mg/dL — ABNORMAL HIGH (ref 8–23)
CO2: 31 mmol/L (ref 22–32)
Calcium: 10.3 mg/dL (ref 8.9–10.3)
Chloride: 90 mmol/L — ABNORMAL LOW (ref 98–111)
Creatinine, Ser: 2.71 mg/dL — ABNORMAL HIGH (ref 0.44–1.00)
GFR, Estimated: 16 mL/min — ABNORMAL LOW (ref >60)
Glucose, Bld: 219 mg/dL — ABNORMAL HIGH (ref 70–99)
Potassium: 4.3 mmol/L (ref 3.5–5.1)
Sodium: 131 mmol/L — ABNORMAL LOW (ref 135–145)

## 2021-03-13 LAB — GLUCOSE, CAPILLARY
Glucose-Capillary: 205 mg/dL — ABNORMAL HIGH (ref 70–99)
Glucose-Capillary: 307 mg/dL — ABNORMAL HIGH (ref 70–99)
Glucose-Capillary: 303 mg/dL — ABNORMAL HIGH (ref 70–99)
Glucose-Capillary: 267 mg/dL — ABNORMAL HIGH (ref 70–99)

## 2021-03-13 LAB — FOLATE: Folate: 6.7 ng/mL (ref >5.9)

## 2021-03-13 LAB — CBC
HCT: 24.3 % — ABNORMAL LOW (ref 36.0–46.0)
Hemoglobin: 8 g/dL — ABNORMAL LOW (ref 12.0–15.0)
MCH: 32.7 pg (ref 26.0–34.0)
MCHC: 32.9 g/dL (ref 30.0–36.0)
MCV: 99.2 fL (ref 80.0–100.0)
Platelets: 253 10*3/uL (ref 150–400)
RBC: 2.45 MIL/uL — ABNORMAL LOW (ref 3.87–5.11)
RDW: 15.1 % (ref 11.5–15.5)
WBC: 10.8 10*3/uL — ABNORMAL HIGH (ref 4.0–10.5)
nRBC: 0 % (ref 0.0–0.2)

## 2021-03-13 LAB — VITAMIN B12: Vitamin B-12: 496 pg/mL (ref 180–914)

## 2021-03-13 MED ORDER — GLUCERNA SHAKE PO LIQD
237.0000 mL | Freq: Three times a day (TID) | ORAL | Status: DC
  Administered 2021-03-13 – 2021-03-15 (×5): 237 mL via ORAL
  Filled 2021-03-13 (×7): qty 237

## 2021-03-13 NOTE — Progress Notes (Signed)
Inpatient Diabetes Program Recommendations  AACE/ADA: New Consensus Statement on Inpatient Glycemic Control (2015)  Target Ranges:  Prepandial:   less than 140 mg/dL      Peak postprandial:   less than 180 mg/dL (1-2 hours)      Critically ill patients:  140 - 180 mg/dL   Lab Results  Component Value Date   GLUCAP 205 (H) 03/13/2021   HGBA1C 8.8 (H) 03/10/2021    Review of Glycemic Control  Diabetes history: DM2 Outpatient Diabetes medications: glipizide 5 mg in am and 2.5 mg in pm Current orders for Inpatient glycemic control: Novolog 0-9 units TID with meals and 0-5 HS On Prednisone 40 mg QAM  HgbA1C - 8.8%  Inpatient Diabetes Program Recommendations:    If appropriate, add Novolog 2 units TID for meal coverage if steroids are continued.   Will follow glucose trends.   Thank you. Lorenda Peck, RD, LDN, CDE Inpatient Diabetes Coordinator [phone]

## 2021-03-13 NOTE — Evaluation (Signed)
Clinical/Bedside Swallow Evaluation Patient Details  Name: Sabrina [name] MRN: [id number] Date of Birth: 1926-03-19  Today's Date: 03/13/2021 Time: SLP Start Time (ACUTE ONLY): 91 SLP Stop Time (ACUTE ONLY): 1815 SLP Time Calculation (min) (ACUTE ONLY): 16 min  Past Medical History:  Past Medical History:  Diagnosis Date   Arthritis    "mostly in my hands, lower back" (06/25/2017)   Basal cell carcinoma (BCC) of face 1983   Breast cancer, right breast ([place]) 03/2013   Chronic kidney disease (CKD), stage III (moderate) ([place])    nephrologist, Dr. [name]   Dental crowns present    DVT (deep venous thrombosis) ([place]) ~ 06/2013   "? side"   Gout    "on daily RX" (06/25/2017)   Heart murmur    no known problems; states did not know she had murmur until age 68   High cholesterol    Hypertension    fluctuates, especially when stressed; has been on med. > 20 yr.   Immature cataract    Non-insulin dependent type 2 diabetes mellitus ([place])    Personal history of chemotherapy    Personal history of radiation therapy    Pulmonary embolism ([place]) ~ 06/2013   Radiation 09/06/13-10/20/13   Right Breast Cancer   Stroke ([place]) 05/2017   just visual problems since (06/25/2017)   Wears partial dentures    lower   Past Surgical History:  Past Surgical History:  Procedure Laterality Date   AXILLARY LYMPH NODE DISSECTION Right 03/30/2013   Procedure: AXILLARY LYMPH NODE DISSECTION;  Surgeon: Rolm Bookbinder, MD;  Location: [place];  Service: General;  Laterality: Right;   BASAL CELL CARCINOMA EXCISION  1983   "face"   BREAST BIOPSY Right 03/17/2013   BREAST CYST EXCISION Right 11/1958   benign   BREAST LUMPECTOMY Right 03/30/2013   BREAST LUMPECTOMY WITH NEEDLE LOCALIZATION Right 03/30/2013   Procedure: BREAST LUMPECTOMY WITH NEEDLE LOCALIZATION;  Surgeon: Rolm Bookbinder, MD;  Location: [name];  Service: General;  Laterality: Right;   DILATION AND CURETTAGE OF UTERUS     LOOP  RECORDER INSERTION N/A 08/15/2017   Procedure: LOOP RECORDER INSERTION;  Surgeon: Evans Lance, MD;  Location: [place] [facility];  Service: Cardiovascular;  Laterality: N/A;   PORT-A-CATH REMOVAL  2016   PORTACATH PLACEMENT N/A 04/15/2013   Procedure: INSERTION PORT-A-CATH;  Surgeon: Rolm Bookbinder, MD;  Location: [place];  Service: General;  Laterality: N/A;   RE-EXCISION OF BREAST CANCER,SUPERIOR MARGINS Right 04/15/2013   Procedure: RE-EXCISION OF RIGHT BREAST  MARGINS;  Surgeon: Rolm Bookbinder, MD;  Location: [place];  Service: General;  Laterality: Right;   TONSILLECTOMY  ~ 1935/1936   HPI:  88 years old female with PMH significant for A. Fibrillation, DVT on Eliquis, CKD 4, DM 2, hypertension, chronic diastolic heart failure, chronic hypoxic respiratory failure on 3 L of supplemental oxygen at baseline, COPD presented in the ED with shortness of breath. Pt had complication of Afib with RVR during hospital coarse.  H/o cva, lower partial, breast cancer s/p XRT.  Swallow evaluation ordered - RN reported she was informed pt had issues with pills yesterday.  CXR without pulmonary edema. Pt reports occasional issues with reflux at home, currently she is on pepcid.    Assessment / Plan / Recommendation  Clinical Impression  Patient awake and cooperative. She followed directions during exam, RR up to 24 at baseline with pt having pursed lip breathing pattern.  CN exam unremarkable.  Intake including water, Glucerna and egg salad provided.  No indication of aspiration observed. Prolonged mastication with egg salad *no bread* with mild oral retention and slight increased respiratory rate. Use of water with swish and expectoration effective to clear retention.  Pt admits to requiring taking medications with applesauce for "a long time"  causing SLP to suspect some component of chronic dysphagia.  Her intake was poor and she reported increased WOB after just a few  bites. Recommended to consider Glucerna to maximize nutrition given pt's poor intake. Order obtained, thank you. Using teach back, educated pt and her caregiver to precautions, including need to rest if dyspenic and stop po if coughing.  Will follow up x1 to assure pt is comfortable with education, compensations. SLP Visit Diagnosis: Dysphagia, oral phase (R13.11)    Aspiration Risk  Mild aspiration risk    Diet Recommendation Dysphagia 3 (Mech soft);Thin liquid   Liquid Administration via: Cup;Straw Medication Administration: Whole meds with puree Supervision: Staff to assist with self feeding Compensations: Slow rate;Small sips/bites Postural Changes: Seated upright at 90 degrees;Remain upright for at least 30 minutes after po intake    Other  Recommendations Oral Care Recommendations: Oral care BID (oral care after meals)    Recommendations for follow up therapy are one component of a multi-disciplinary discharge planning process, led by the attending physician.  Recommendations may be updated based on patient status, additional functional criteria and insurance authorization.  Follow up Recommendations        Assistance Recommended at Discharge None  Functional Status Assessment    Frequency and Duration min 1 x/week  1 week       Prognosis Prognosis for Safe Diet Advancement: Fair      Swallow Study   General Date of Onset: 03/13/21 HPI: 88 years old female with PMH significant for A. Fibrillation, DVT on Eliquis, CKD 4, DM 2, hypertension, chronic diastolic heart failure, chronic hypoxic respiratory failure on 3 L of supplemental oxygen at baseline, COPD presented in the ED with shortness of breath. Pt had complication of Afib with RVR during hospital coarse.  H/o cva, lower partial, breast cancer s/p XRT.  Swallow evaluation ordered - RN reported she was informed pt had issues with pills yesterday.  CXR without pulmonary edema. Pt reports occasional issues with reflux at  home, currently she is on pepcid. Type of Study: Bedside Swallow Evaluation Diet Prior to this Study: Regular;Thin liquids Temperature Spikes Noted: No Respiratory Status: Nasal cannula History of Recent Intubation: No Behavior/Cognition: Alert;Cooperative;Pleasant mood Oral Cavity Assessment: Within Functional Limits Oral Care Completed by SLP: No Oral Cavity - Dentition: Adequate natural dentition Self-Feeding Abilities: Needs assist Patient Positioning: Other (comment);Postural control interferes with function (pt is kyphotic, which likely contributes to dysphagia) Baseline Vocal Quality: Low vocal intensity Volitional Cough: Weak Volitional Swallow: Able to elicit    Oral/Motor/Sensory Function Overall Oral Motor/Sensory Function: Generalized oral weakness   Ice Chips Ice chips: Not tested   Thin Liquid Thin Liquid: Within functional limits Presentation: Straw    Nectar Thick Nectar Thick Liquid: Not tested   Honey Thick Honey Thick Liquid: Not tested   Puree Puree: Not tested   Solid     Solid: Impaired Presentation: Spoon Oral Phase Impairments: Impaired mastication Oral Phase Functional Implications: Oral residue;Prolonged oral transit Other Comments: prolonged mastication      Macario Golds 03/13/2021,7:11 PM   Kathleen Lime, MS [facility] SLP Acute Rehab Services Office [phone] Cell [phone]

## 2021-03-13 NOTE — Evaluation (Signed)
Physical Therapy Evaluation Patient Details Name: Sabrina [name] MRN: [id number] DOB: [birth year] Today's Date: 03/13/2021  History of Present Illness   Patient is a 88 y.o. female who is presenting from home due to worsening shortness of breath and work of breathing.  Patient is on 3L chronically at home and has 24/7 care from home aids. Pt's home nurse who noted she was having green sputum, increased work of breathing and a lot of wheezing. and pt brought to ED. Pt admitted for COPD and possible CHF exacerbation. PMH significant for for A. Fibrillation, DVT on Eliquis, CKD 4, DM 2, hypertension, chronic diastolic heart failure, COPD.    Clinical Impression  Sabrina [name] is 88 y.o. female admitted with above HPI and diagnosis. Patient is currently limited by functional impairments below (see PT problem list). Patient has been hospitalized multiple times in last 6 months and is requiring increased assistance from home aids. Pt has multiple aids providing 24/7 care and has been transferring bed<>wheelchair<>BSC/chair with their assist. Currently pt required +2 Max assist for bed mobility and transfers with RW. Patient will benefit from continued skilled PT interventions to address impairments and progress independence with mobility, recommending HHPT, OT, Nursing to maximize assist and therapy for improved transfers with return home ans 24/7 assist from home-care agency. Acute PT will follow and progress as able.        Recommendations for follow up therapy are one component of a multi-disciplinary discharge planning process, led by the attending physician.  Recommendations may be updated based on patient status, additional functional criteria and insurance authorization.  PT Recommendation   Follow Up Recommendations Home health PT Filed 03/13/2021 1000  Assistance recommended at discharge Frequent or constant Supervision/Assistance Filed 03/13/2021 1000  Patient can return home with the  following Two people to help with walking and/or transfers, Two people to help with bathing/dressing/bathroom, Assistance with cooking/housework, Assistance with feeding, Direct supervision/assist for medications management, Assist for transportation, Help with stairs or ramp for entrance Filed 03/13/2021 1000  Functional Status Assessment Patient has had a recent decline in their functional status and/or demonstrates limited ability to make significant improvements in function in a reasonable and predictable amount of time Filed 03/13/2021 [place] bed Filed 03/13/2021 1000      03/13/21 1000  PT Visit Information  Last PT Received On 03/13/21  Assistance Needed +2  History of Present Illness   Precautions  Precautions Fall  Restrictions  Weight Bearing Restrictions No  Other Position/Activity Restrictions recent Rt ankle fracture (per caregiver pt cleared to wear ankle brace and no longer needs CAM boot)  Home Living  Family/patient expects to be discharged to: Private residence  Living Arrangements Other (Comment)  Available Help at Discharge Personal care attendant;Available 24 hours/day  Type of [place] One level  Bathroom Shower/Tub Walk-in shower;Tub/shower unit  Corporate treasurer No  Home Equipment Cane - single point;Rolling Walker (2 wheels);Shower seat;Wheelchair - manual;Tub bench  Additional Comments pt no longer able to get into bathroom since ankle fracture. pt's caregivers using wheelchair to transfer pt from bed to [facility].  Prior Function  Prior Level of Function  Needs assist  Physical Assist  Mobility (physical);ADLs (physical)  Mobility (physical) Bed mobility;Transfers;Gait  Mobility Comments assist for all mobility tasks since R fibula fx 11/29-transfers only currently.  ADLs Comments 1 person assist to to pivot bed<>wheelchair  Communication  Communication Expressive  difficulties;Other (  comment)  Pain Assessment  Breathing 0  Negative Vocalization 1  Facial Expression 0  Body Language 0  Consolability 0  PAINAD Score 1  Cognition  Arousal/Alertness Awake/alert  Behavior During Therapy WFL for tasks assessed/performed  Overall Cognitive Status Within Functional Limits for tasks assessed  Upper Extremity Assessment  Upper Extremity Assessment Generalized weakness  Lower Extremity Assessment  Lower Extremity Assessment Generalized weakness  Cervical / Trunk Assessment  Cervical / Trunk Assessment Kyphotic  Bed Mobility  Overal bed mobility Needs Assistance  Bed Mobility Supine to Sit  Supine to sit Max assist;HOB elevated  General bed mobility comments Max Assist to reach for bed rail and use of bed pad to pivot hips and raise trunk upright.  Transfers  Overall transfer level Needs assistance  Equipment used Rolling walker (2 wheels);2 person hand held assist  Transfers Sit to/from Stand;Bed to chair/wheelchair/BSC  Sit to Stand Mod assist;Max assist;+2 physical assistance;+2 safety/equipment  Bed to/from chair/wheelchair/BSC transfer type: Step pivot  Step pivot transfers Max assist;+2 safety/equipment;+2 physical assistance  General transfer comment cues for hand placement to power up from EOB, Max assist and pt unable to achieve full upright posture. Pt stood 3x total and unable to mainatin static stand for >5 seconds due to fatigue. On final stand Max Assist to guide steps and pivot walker to recliner.  Balance  Overall balance assessment Needs assistance;History of Falls  Sitting-balance support Feet supported;Bilateral upper extremity supported  Sitting balance-Leahy Scale Fair  Standing balance support Reliant on assistive device for balance;During functional activity;Bilateral upper extremity supported  Standing balance-Leahy Scale Poor  PT - End of Session  Equipment Utilized During Treatment Gait belt;Oxygen  Activity Tolerance  Patient tolerated treatment well  Patient left in chair;with call bell/phone within reach;with family/visitor present  Nurse Communication Mobility status;Need for lift equipment  PT Assessment  PT Recommendation/Assessment Patient needs continued PT services  PT Visit Diagnosis Difficulty in walking, not elsewhere classified (R26.2);Unsteadiness on feet (R26.81);Other abnormalities of gait and mobility (R26.89);Muscle weakness (generalized) (M62.81)  PT Problem List Decreased strength;Decreased activity tolerance;Decreased balance;Decreased mobility;Decreased cognition;Decreased knowledge of use of DME;Decreased safety awareness;Decreased knowledge of precautions  PT Plan  PT Frequency (ACUTE ONLY) Min 3X/week  PT Treatment/Interventions (ACUTE ONLY) DME instruction;Gait training;Stair training;Functional mobility training;Therapeutic activities;Therapeutic exercise;Balance training;Patient/family education  AM-PAC PT "6 Clicks" Mobility Outcome Measure (Version 2)  Help needed turning from your back to your side while in a flat bed without using bedrails? 1  Help needed moving from lying on your back to sitting on the side of a flat bed without using bedrails? 1  Help needed moving to and from a bed to a chair (including a wheelchair)? 1  Help needed standing up from a chair using your arms (e.g., wheelchair or bedside chair)? 1  Help needed to walk in hospital room? 1  Help needed climbing 3-5 steps with a railing?  1  6 Click Score 6  Consider Recommendation of Discharge To: CIR/SNF/LTACH  Progressive Mobility  What is the highest level of mobility based on the progressive mobility assessment? Level 2 (Chairfast) - Balance while sitting on edge of bed and cannot stand  Mobility Sit up in bed/chair position for meals  PT Recommendation  Follow Up Recommendations Home health PT  Assistance recommended at discharge Frequent or constant Supervision/Assistance  Patient can return home with  the following Two people to help with walking and/or transfers;Two people to help with bathing/dressing/bathroom;Assistance with cooking/housework;Assistance with feeding;Direct supervision/assist for medications management;Assist for  transportation;Help with stairs or ramp for entrance  Functional Status Assessment Patient has had a recent decline in their functional status and/or demonstrates limited ability to make significant improvements in function in a reasonable and predictable amount of time  PT equipment Hospital bed  Individuals Consulted  Consulted and Agree with Results and Recommendations Family member/caregiver  Family Member Consulted pt's home aide/caregiver present discussing mobility goals  Acute Rehab PT Goals  Patient Stated Goal improve transfers  PT Goal Formulation Patient unable to participate in goal setting  Time For Goal Achievement 03/27/21  Potential to Achieve Goals Fair  PT Time Calculation  PT Start Time (ACUTE ONLY) 1007  PT Stop Time (ACUTE ONLY) 1045  PT Time Calculation (min) (ACUTE ONLY) 38 min  PT General Charges  $$ ACUTE PT VISIT 1 Visit  PT Evaluation  $PT Eval Moderate Complexity 1 Mod  PT Treatments  $Therapeutic Activity 23-37 mins  Written Expression  Dominant Hand Right    Verner Mould, DPT Acute Rehabilitation Services Office [phone] Pager [phone]   Jacques Navy 03/13/2021, 2:33 PM

## 2021-03-13 NOTE — Evaluation (Signed)
SLP Cancellation Note  Patient Details Name: Sabrina [name] MRN: [id number] DOB: 02/25/1927   Cancelled treatment:       Reason Eval/Treat Not Completed: Other (comment) (pt working with PT this morning when attempted visit)  Kathleen Lime, [place] [facility] SLP [place] Office [phone] Cell [phone]   Macario Golds 03/13/2021, 1:15 PM

## 2021-03-13 NOTE — Progress Notes (Signed)
Pt noted with areas of blanchable redness with dark purple areas noted to posterior and inner thighs and perineum.  Also presents with a dark purple bump to left upper labia.  Charge RN Mandy in room to assess; Wound care consult in place to assess for DTI.  Pt also bladder scanned for 400 - foley put into place at 2200 - 600+ pale clear yellow urine returned.

## 2021-03-13 NOTE — Progress Notes (Signed)
PROGRESS NOTE    Sabrina [name]  SWF:[id number] DOB: Dec 22, 1926 DOA: 03/09/2021 PCP: [name], MD   Brief Narrative:  This 88 years old female with PMH significant for A. Fibrillation, DVT on Eliquis, CKD 4, DM 2, hypertension, chronic diastolic heart failure, chronic hypoxic respiratory failure on 3 L of supplemental oxygen at baseline, COPD presented in the ED with shortness of breath.  History is obtained from ED chart since patient has baseline dementia.  Patient was found to have significant wheezing and was given breathing treatment, She went into A. fib with RVR.  She was given IV Cardizem and started on nitro gtt. for hypertensive urgency. Patient is admitted for COPD and possible CHF exacerbation. Cardiology consulted for elevated troponin,  recommended to continue current management,  No plan for any ischemic work-up at this point.  Assessment & Plan:   Principal Problem:   COPD exacerbation ([place]) Active Problems:   Atrial fibrillation with RVR ([place])   Hypertensive urgency   Demand ischemia ([place])  Hypertensive urgency:> Resolved. She was severely hypertensive.  BP 231/75 on arrival. Patient was started on nitro gtt., Blood pressure has improved. She was resumed on her blood pressure medications. Nitro gtt. discontinued. Continue hydralazine, Imdur, Cardizem, clonidine, Lasix.  Atrial fibrillation with RVR: Heart rate is now improved, converted to NSR Continue Cardizem 60 mg every 8 hours,  Continue Eliquis.  COPD exacerbation: Chronic hypoxic respiratory failure: Patient remains on her baseline oxygen requirement. COVID, influenza negative.  Chest x-ray unremarkable. Continue steroids and nebulized bronchodilators.  CKD stage IV: Serum creatinine at baseline(2.3- 2.7) Avoid nephrotoxic medications.  Type 2 diabetes: Carb modified diet,  Hemoglobin A1c 8.8. Continue regular insulin sliding scale.  Hypercalcemia: Continue IV hydration.  Monitor serum  calcium. Serum calcium improved.  Elevated troponin: Acute on chronic diastolic CHF: Patient denies any chest pain,  It could be due to demand ischemia. 2D echocardiogram unremarkable.  LVEF 55 to 60%. Continue Lasix 40 mg IV daily. Cardiology consulted.  Recommended no ischemic work-up needed. Elevated troponin could be due to demand ischemia.  Macrocytic anemia: H&H is stable, no evidence of bleeding. Check B12 and folate.  Right ankle fracture: S/p fall, right ankle fracture, s/p ORIF Follow-up orthopedics outpatient.   DVT prophylaxis: Eliquis Code Status:DNR Family Communication: Caretaker at bed side. Disposition Plan:   Status is: Inpatient  Remains inpatient appropriate because:   Admitted for COPD exacerbation. Hospital course complicated by A. fib with RVR requiring IV Cardizem.  She is now converted to NSR, continue current management.  Anticipated discharge in 1 to 2 days.  PT and OT evaluation pending.   Consultants:  Cardiology  Procedures: Echocardiogram  Antimicrobials: None.  Subjective: Patient was seen and examined at bedside. Overnight events noted.   She reports feeling much improved.  She remains at her baseline. Her heart rate is controlled and back to NSR, denies chest pain or shortness of breath.  Objective: Vitals:   03/13/21 0500 03/13/21 0744 03/13/21 0800 03/13/21 0938  BP:   (!) 160/38   Pulse:   77 64  Resp:   20 19  Temp:  98.5 F (36.9 C)    TempSrc:  Oral    SpO2:  99% 98% 98%  Weight: 71 kg     Height:        Intake/Output Summary (Last 24 hours) at 03/13/2021 1128 Last data filed at 03/13/2021 0800 Gross per 24 hour  Intake 30 ml  Output 1750 ml  Net -1720 ml  Filed Weights   03/11/21 0500 03/12/21 0500 03/13/21 0500  Weight: 71 kg 71.2 kg 71 kg    Examination:  General exam: Appears comfortable, deconditioned, not in any distress. Respiratory system: Clear to auscultation bilaterally, respiratory effort  normal, RR 15 Cardiovascular system: S1-S2 heard, regular rate and rhythm, murmur+ Gastrointestinal system: Abdomen is soft, Mildly distended, nontender, BS+ Central nervous system: Alert and oriented X 2. No focal neurological deficits. Extremities: No edema, no cyanosis, no clubbing.  Right foot in a boot. Skin: No rashes, lesions or ulcers Psychiatry:  Mood & affect appropriate.     Data Reviewed: I have personally reviewed following labs and imaging studies  CBC: Recent Labs  Lab 03/09/21 2124 03/10/21 0800 03/11/21 0240 03/12/21 0247 03/12/21 0919 03/13/21 0301  WBC 9.8 10.1 13.5* 12.7*  --  10.8*  NEUTROABS 5.5 8.6*  --   --   --   --   HGB 9.2* 9.3* 8.4* 7.5* 7.8* 8.0*  HCT 29.5* 28.4* 26.5* 23.2* 23.6* 24.3*  MCV 100.7* 100.0 101.5* 100.9*  --  99.2  PLT 279 285 277 240  --  387   Basic Metabolic Panel: Recent Labs  Lab 03/09/21 2124 03/10/21 0800 03/11/21 0240 03/12/21 0247 03/13/21 0301  NA 132* 132* 135 132* 131*  K 3.7 4.3 4.2 4.6 4.3  CL 91* 90* 92* 91* 90*  CO2 29 27 33* 32 31  GLUCOSE 291* 475* 299* 270* 219*  BUN 68* 72* 73* 90* 100*  CREATININE 2.53* 2.62* 2.61* 2.83* 2.71*  CALCIUM 11.0* 11.2* 11.1* 10.3 10.3  MG  --   --   --  2.7*  --   PHOS  --   --   --  4.5  --    GFR: Estimated Creatinine Clearance: 11.4 mL/min (A) (by C-G formula based on SCr of 2.71 mg/dL (H)). Liver Function Tests: Recent Labs  Lab 03/10/21 0800 03/11/21 0240  AST 17 15  ALT 17 14  ALKPHOS 92 75  BILITOT 1.1 0.7  PROT 7.3 6.7  ALBUMIN 3.7 3.6   No results for input(s): LIPASE, AMYLASE in the last 168 hours. No results for input(s): AMMONIA in the last 168 hours. Coagulation Profile: No results for input(s): INR, PROTIME in the last 168 hours. Cardiac Enzymes: No results for input(s): CKTOTAL, CKMB, CKMBINDEX, TROPONINI in the last 168 hours. BNP (last 3 results) No results for input(s): PROBNP in the last 8760 hours. HbA1C: No results for input(s):  HGBA1C in the last 72 hours.  CBG: Recent Labs  Lab 03/12/21 0733 03/12/21 1226 03/12/21 1633 03/12/21 2116 03/13/21 0754  GLUCAP 250* 319* 289* 258* 205*   Lipid Profile: No results for input(s): CHOL, HDL, LDLCALC, TRIG, CHOLHDL, LDLDIRECT in the last 72 hours. Thyroid Function Tests: No results for input(s): TSH, T4TOTAL, FREET4, T3FREE, THYROIDAB in the last 72 hours. Anemia Panel: No results for input(s): VITAMINB12, FOLATE, FERRITIN, TIBC, IRON, RETICCTPCT in the last 72 hours. Sepsis Labs: No results for input(s): PROCALCITON, LATICACIDVEN in the last 168 hours.  Recent Results (from the past 240 hour(s))  Resp Panel by RT-PCR (Flu A&B, Covid) Nasopharyngeal Swab     Status: None   Collection Time: 03/04/21  9:55 AM   Specimen: Nasopharyngeal Swab; Nasopharyngeal(NP) swabs in vial transport medium  Result Value Ref Range Status   SARS Coronavirus 2 by RT PCR NEGATIVE NEGATIVE Final    Comment: (NOTE) SARS-CoV-2 target nucleic acids are NOT DETECTED.  The SARS-CoV-2 RNA is generally detectable in upper  respiratory specimens during the acute phase of infection. The lowest concentration of SARS-CoV-2 viral copies this assay can detect is 138 copies/mL. A negative result does not preclude SARS-Cov-2 infection and should not be used as the sole basis for treatment or other patient management decisions. A negative result may occur with  improper specimen collection/handling, submission of specimen other than nasopharyngeal swab, presence of viral mutation(s) within the areas targeted by this assay, and inadequate number of viral copies(<138 copies/mL). A negative result must be combined with clinical observations, patient history, and epidemiological information. The expected result is Negative.  Fact Sheet for Patients:  EntrepreneurPulse.com.au  Fact Sheet for Healthcare Providers:  IncredibleEmployment.be  This test is no t yet  approved or cleared by the [place] FDA and  has been authorized for detection and/or diagnosis of SARS-CoV-2 by FDA under an Emergency Use Authorization (EUA). This EUA will remain  in effect (meaning this test can be used) for the duration of the COVID-19 declaration under Section 564(b)(1) of the Act, 21 U.S.C.section 360bbb-3(b)(1), unless the authorization is terminated  or revoked sooner.       Influenza A by PCR NEGATIVE NEGATIVE Final   Influenza B by PCR NEGATIVE NEGATIVE Final    Comment: (NOTE) The Xpert Xpress SARS-CoV-2/FLU/RSV plus assay is intended as an aid in the diagnosis of influenza from Nasopharyngeal swab specimens and should not be used as a sole basis for treatment. Nasal washings and aspirates are unacceptable for Xpert Xpress SARS-CoV-2/FLU/RSV testing.  Fact Sheet for Patients: EntrepreneurPulse.com.au  Fact Sheet for Healthcare Providers: IncredibleEmployment.be  This test is not yet approved or cleared by the [place] FDA and has been authorized for detection and/or diagnosis of SARS-CoV-2 by FDA under an Emergency Use Authorization (EUA). This EUA will remain in effect (meaning this test can be used) for the duration of the COVID-19 declaration under Section 564(b)(1) of the Act, 21 U.S.C. section 360bbb-3(b)(1), unless the authorization is terminated or revoked.  Performed at [facility], [place] [address] [place], [place] [address]   Resp Panel by RT-PCR (Flu A&B, Covid) Nasopharyngeal Swab     Status: None   Collection Time: 03/09/21  9:40 PM   Specimen: Nasopharyngeal Swab; Nasopharyngeal(NP) swabs in vial transport medium  Result Value Ref Range Status   SARS Coronavirus 2 by RT PCR NEGATIVE NEGATIVE Final    Comment: (NOTE) SARS-CoV-2 target nucleic acids are NOT DETECTED.  The SARS-CoV-2 RNA is generally detectable in upper respiratory specimens during the acute phase of  infection. The lowest concentration of SARS-CoV-2 viral copies this assay can detect is 138 copies/mL. A negative result does not preclude SARS-Cov-2 infection and should not be used as the sole basis for treatment or other patient management decisions. A negative result may occur with  improper specimen collection/handling, submission of specimen other than nasopharyngeal swab, presence of viral mutation(s) within the areas targeted by this assay, and inadequate number of viral copies(<138 copies/mL). A negative result must be combined with clinical observations, patient history, and epidemiological information. The expected result is Negative.  Fact Sheet for Patients:  EntrepreneurPulse.com.au  Fact Sheet for Healthcare Providers:  IncredibleEmployment.be  This test is no t yet approved or cleared by the [place] FDA and  has been authorized for detection and/or diagnosis of SARS-CoV-2 by FDA under an Emergency Use Authorization (EUA). This EUA will remain  in effect (meaning this test can be used) for the duration of the COVID-19 declaration under Section 564(b)(1) of  the Act, 21 U.S.C.section 360bbb-3(b)(1), unless the authorization is terminated  or revoked sooner.       Influenza A by PCR NEGATIVE NEGATIVE Final   Influenza B by PCR NEGATIVE NEGATIVE Final    Comment: (NOTE) The Xpert Xpress SARS-CoV-2/FLU/RSV plus assay is intended as an aid in the diagnosis of influenza from Nasopharyngeal swab specimens and should not be used as a sole basis for treatment. Nasal washings and aspirates are unacceptable for Xpert Xpress SARS-CoV-2/FLU/RSV testing.  Fact Sheet for Patients: EntrepreneurPulse.com.au  Fact Sheet for Healthcare Providers: IncredibleEmployment.be  This test is not yet approved or cleared by the [place] FDA and has been authorized for detection and/or diagnosis of SARS-CoV-2  by FDA under an Emergency Use Authorization (EUA). This EUA will remain in effect (meaning this test can be used) for the duration of the COVID-19 declaration under Section 564(b)(1) of the Act, 21 U.S.C. section 360bbb-3(b)(1), unless the authorization is terminated or revoked.  Performed at [facility], [place] [address] [place], [place] [address]   Respiratory (~20 pathogens) panel by PCR     Status: None   Collection Time: 03/10/21  3:11 PM   Specimen: Nasopharyngeal Swab; Respiratory  Result Value Ref Range Status   Adenovirus NOT DETECTED NOT DETECTED Final   Coronavirus 229E NOT DETECTED NOT DETECTED Final    Comment: (NOTE) The Coronavirus on the Respiratory Panel, DOES NOT test for the novel  Coronavirus (2019 nCoV)    Coronavirus HKU1 NOT DETECTED NOT DETECTED Final   Coronavirus NL63 NOT DETECTED NOT DETECTED Final   Coronavirus OC43 NOT DETECTED NOT DETECTED Final   Metapneumovirus NOT DETECTED NOT DETECTED Final   Rhinovirus / Enterovirus NOT DETECTED NOT DETECTED Final   Influenza A NOT DETECTED NOT DETECTED Final   Influenza B NOT DETECTED NOT DETECTED Final   Parainfluenza Virus 1 NOT DETECTED NOT DETECTED Final   Parainfluenza Virus 2 NOT DETECTED NOT DETECTED Final   Parainfluenza Virus 3 NOT DETECTED NOT DETECTED Final   Parainfluenza Virus 4 NOT DETECTED NOT DETECTED Final   Respiratory Syncytial Virus NOT DETECTED NOT DETECTED Final   Bordetella pertussis NOT DETECTED NOT DETECTED Final   Bordetella Parapertussis NOT DETECTED NOT DETECTED Final   Chlamydophila pneumoniae NOT DETECTED NOT DETECTED Final   Mycoplasma pneumoniae NOT DETECTED NOT DETECTED Final    Comment: Performed at [facility], [place]. [address], [place], [place] [address]   Radiology Studies: No results found.  Scheduled Meds:  apixaban  2.5 mg Oral BID   Chlorhexidine Gluconate Cloth  6 each Topical Daily   cholecalciferol  1,000 Units Oral Daily   cloNIDine   0.2 mg Oral BID   diltiazem  60 mg Oral Q8H   furosemide  40 mg Intravenous Daily   guaiFENesin  600 mg Oral BID   hydrALAZINE  50 mg Oral TID   insulin aspart  0-5 Units Subcutaneous QHS   insulin aspart  0-9 Units Subcutaneous TID WC   isosorbide mononitrate  30 mg Oral Daily   mouth rinse  15 mL Mouth Rinse BID   mometasone-formoterol  2 puff Inhalation BID   predniSONE  40 mg Oral Q breakfast   senna-docusate  1 tablet Oral BID   Continuous Infusions:   LOS: 3 days    Time spent: 45 mins    Martine Bleecker, MD [facility]   If 7PM-7AM, please contact night-coverage

## 2021-03-13 NOTE — Progress Notes (Signed)
Orthopedic Tech Progress Note Patient Details:  Sabrina [name] 19-May-1926 [id number]  Patient ID: [name], female   DOB: 10/23/1926, 88 y.o.   MRN: [id number]  [name] 03/13/2021, 10:25 AM Aso applied to right ankle per instructions from PT

## 2021-03-13 NOTE — TOC Initial Note (Addendum)
Transition of Care [facility]) - Initial/Assessment Note    Patient Details  Name: Sabrina [name] MRN: [id number] Date of Birth: [birth year]  Transition of Care [facility]) CM/SW Contact:    Ross Ludwig, LCSW Phone Number: 03/13/2021, 5:09 PM  Clinical Narrative:                  Patient is a 88 year old female who lives at home with 24/7 caregivers.  Patient is alert and oriented x4.  Patient is currently on 3L of oxygen, unknown if she has oxygen at home.  Patient is [facility] and OT.  PT worked with patient, and are recommending home health services.  CSW updated Cindie at [facility], that patient is in the hospital.    Plan to return back home with home health services and care providers.  Patient also being followed by [facility] for outpatient palliative services.  TOC to continue to follow patient's progress throughout discharge planning.   Expected Discharge Plan: [place] Barriers to Discharge: Continued Medical Work up   Patient Goals and CMS Choice Patient states their goals for this hospitalization and ongoing recovery are:: To return back home with home health services and private duty care providers. CMS Medicare.gov Compare Post Acute Care list provided to:: Patient Choice offered to / list presented to : Patient  Expected Discharge Plan and Services Expected Discharge Plan: [place] Choice: [place] arrangements for the past 2 months: [place] Arranged: PT, OT HH Agency: [place] Date [place]: 03/13/21 Time [place]: [id number] Representative spoke with at [place]: [place] Arrangements/Services Living arrangements for the past 2 months: [place] with:: Self Patient language and need for interpreter reviewed:: Yes Do you feel safe going back to the place where you  live?: Yes      Need for Family Participation in Patient Care: No (Comment) Care giver support system in place?: Yes (comment) Current home services: Home PT, Home OT Criminal Activity/Legal Involvement Pertinent to Current Situation/Hospitalization: No - Comment as needed  Activities of Daily Living Home Assistive Devices/Equipment: Oxygen, Wheelchair ADL Screening (condition at time of admission) Patient's cognitive ability adequate to safely complete daily activities?: No Is the patient deaf or have difficulty hearing?: No Does the patient have difficulty seeing, even when wearing glasses/contacts?: Yes Does the patient have difficulty concentrating, remembering, or making decisions?: Yes Patient able to express need for assistance with ADLs?: Yes Does the patient have difficulty dressing or bathing?: Yes Independently performs ADLs?: No Communication: Independent, Needs assistance Is this a change from baseline?: Pre-admission baseline Dressing (OT): Dependent Is this a change from baseline?: Pre-admission baseline Grooming: Dependent Is this a change from baseline?: Pre-admission baseline Feeding: Dependent Is this a change from baseline?: Pre-admission baseline Bathing: Dependent Is this a change from baseline?: Pre-admission baseline Toileting: Dependent Is this a change from baseline?: Pre-admission baseline In/Out Bed: Dependent Is this a change from baseline?: Pre-admission baseline Walks in Home: Dependent Is this a change from baseline?: Pre-admission baseline Does the patient have difficulty walking or climbing stairs?: Yes Weakness of Legs: Both Weakness of Arms/Hands: Both  Permission Sought/Granted Permission sought to share information with : Facility  Contact Representative Permission granted to share information with : Yes, Release of Information Signed  Share Information with NAME: Mitzie Na [phone]  [phone]  Eloisa Northern Niece    [phone]  Permission granted to share info w AGENCY: [place]        Emotional Assessment Appearance:: Appears stated age   Affect (typically observed): Accepting, Calm, Appropriate Orientation: : Oriented to Place, Oriented to  Time, Oriented to Situation, Oriented to Self Alcohol / Substance Use: Not Applicable Psych Involvement: No (comment)  Admission diagnosis:  COPD exacerbation ([place]) [J44.1] Hypertensive urgency [I16.0] Atrial fibrillation with RVR (HCC) [I48.91] Patient Active Problem List   Diagnosis Date Noted   Hypertensive urgency    Demand ischemia ([place])    COPD exacerbation ([place]) 03/10/2021   Atrial fibrillation with RVR ([place]) 03/10/2021   Anemia 02/08/2021   Symptomatic anemia 02/07/2021   Nonspecific abnormal electrocardiogram (ECG) (EKG)    Dyspnea 12/18/2020   Abnormal gait 05/18/2020   Body mass index (BMI) 35.0-35.9, adult 05/18/2020   Cardiac pacemaker in situ 05/18/2020   Diabetic nephropathy (HCC) 05/18/2020   IgM monoclonal gammopathy of uncertain significance 05/18/2020   Malignant hypertensive chronic kidney disease 05/18/2020   Morbid obesity ([place]) 05/18/2020   Thrombophilia ([place]) 05/18/2020   Secondary hypercoagulable state ([place]) 08/16/2019   Osteoporosis 05/04/2019   Hypercalcemia 11/04/2018   History of stroke 11/04/2018   Paroxysmal atrial fibrillation ([place]) 11/04/2018   Acute kidney injury ([place])    Acute metabolic encephalopathy 54/65/6812   Personal history of DVT and pulm embolus 10/26/2018   Personal history of other venous thrombosis and embolism 10/26/2018   UTI (urinary tract infection) 10/24/2018   Clavicle fracture 09/26/2018   Degeneration of lumbar intervertebral disc 10/01/2017   Blurry vision, bilateral    Acute blood loss anemia    History of breast cancer 05/30/2017   Physical debility 05/30/2017   Occipital infarction ([place]) 05/30/2017   Pressure injury of skin 05/28/2017   Fall at home 05/27/2017   CKD  (chronic kidney disease), stage IV ([place]) 05/27/2017   High cholesterol 05/27/2017   Type 2 diabetes mellitus with stage 4 chronic kidney disease ([place]) 05/27/2017   Hypertension associated with diabetes ([place]) 05/27/2017   Gout 05/27/2017   Elevated troponin 05/27/2017   Lumbar radiculopathy 05/15/2017   Pain of right calf 04/30/2017   Asymmetrical left sensorineural hearing loss 02/14/2016   Impacted cerumen of left ear 02/14/2016   Lipoma of lower extremity 09/12/2014   Abnormal x-ray 03/15/2014   Chronic diastolic congestive heart failure ([place]) 02/23/2014   Candidiasis of skin 01/13/2014   Osteopenia 11/30/2013   Hypoglycemia 09/13/2013   Acute respiratory failure with hypoxia ([place]) 06/14/2013   History of pulmonary embolism 06/10/2013   Nausea and vomiting 06/10/2013   Accelerated hypertension 06/10/2013   Candidiasis of vagina 05/19/2013   Aortic stenosis 04/22/2013   Edema leg 04/22/2013   Breast cancer of upper-outer quadrant of right female breast ([place]); in remission 03/19/2013   PCP:  [name], MD Pharmacy:   [facility], [place] [address] Phone: [phone] Fax: [phone]     Social Determinants of Health (SDOH) Interventions    Readmission Risk Interventions Readmission Risk Prevention Plan 12/22/2020 12/20/2020  Transportation Screening Complete Complete  PCP or Specialist Appt within 5-7 Days Complete Complete  Home Care Screening Complete Complete  Medication Review (RN CM) Complete Complete  Some recent data might  be hidden

## 2021-03-13 NOTE — Consult Note (Addendum)
WOC Nurse Consult Note: Patient receiving care in [place] Reason for Consult: DTI Bilateral posterior and inner thighs, perineum, left upper labia Wound type: This is not a DTI but rather appears as bruising of unknown origin in the perineal, upper inner thigh region and bilateral buttocks. Patient states she bruises real easy and doesn't know how she got the bruising.  Pressure Injury POA: NA Measurement: Deferred Wound bed: Purple, maroon with no pain and no drainage or open wounds Dressing procedure/placement/frequency: Clean the entire perineal and buttocks area with soap and water, rinse and pat dry. Continue sacral foam dressing for prophylaxis and offload in this area as much as possible.  Turn every 2 hours. Will need mattress replacement if moved out of ICU.   Monitor the wound area(s) for worsening of condition such as: Signs/symptoms of infection, increase in size, development of or worsening of odor, development of pain, or increased pain at the affected locations.   Notify the medical team if any of these develop.  Thank you for the consult. [place] nurse will not follow at this time.   Please re-consult the [place] team if needed.  Cathlean Marseilles Tamala Julian, MSN, RN, [place], [place]le [place], [facility] Wound Treatment Associate Pager [phone]

## 2021-03-14 ENCOUNTER — Inpatient Hospital Stay (HOSPITAL_COMMUNITY): Payer: Medicare Other

## 2021-03-14 LAB — BASIC METABOLIC PANEL
Anion gap: 10 (ref 5–15)
BUN: 100 mg/dL — ABNORMAL HIGH (ref 8–23)
CO2: 30 mmol/L (ref 22–32)
Calcium: 9.8 mg/dL (ref 8.9–10.3)
Chloride: 90 mmol/L — ABNORMAL LOW (ref 98–111)
Creatinine, Ser: 2.57 mg/dL — ABNORMAL HIGH (ref 0.44–1.00)
GFR, Estimated: 17 mL/min — ABNORMAL LOW (ref >60)
Glucose, Bld: 188 mg/dL — ABNORMAL HIGH (ref 70–99)
Potassium: 4.1 mmol/L (ref 3.5–5.1)
Sodium: 130 mmol/L — ABNORMAL LOW (ref 135–145)

## 2021-03-14 LAB — PTH, INTACT AND CALCIUM
Calcium, Total (PTH): 10.1 mg/dL (ref 8.7–10.3)
PTH: 99 pg/mL — ABNORMAL HIGH (ref 15–65)

## 2021-03-14 LAB — CBC
HCT: 23.9 % — ABNORMAL LOW (ref 36.0–46.0)
Hemoglobin: 7.9 g/dL — ABNORMAL LOW (ref 12.0–15.0)
MCH: 32.6 pg (ref 26.0–34.0)
MCHC: 33.1 g/dL (ref 30.0–36.0)
MCV: 98.8 fL (ref 80.0–100.0)
Platelets: 244 10*3/uL (ref 150–400)
RBC: 2.42 MIL/uL — ABNORMAL LOW (ref 3.87–5.11)
RDW: 14.6 % (ref 11.5–15.5)
WBC: 9.1 10*3/uL (ref 4.0–10.5)
nRBC: 0 % (ref 0.0–0.2)

## 2021-03-14 LAB — PHOSPHORUS: Phosphorus: 4.1 mg/dL (ref 2.5–4.6)

## 2021-03-14 LAB — GLUCOSE, CAPILLARY
Glucose-Capillary: 157 mg/dL — ABNORMAL HIGH (ref 70–99)
Glucose-Capillary: 388 mg/dL — ABNORMAL HIGH (ref 70–99)
Glucose-Capillary: 369 mg/dL — ABNORMAL HIGH (ref 70–99)

## 2021-03-14 LAB — MAGNESIUM: Magnesium: 2.6 mg/dL — ABNORMAL HIGH (ref 1.7–2.4)

## 2021-03-14 MED ORDER — AMLODIPINE BESYLATE 5 MG PO TABS
2.5000 mg | ORAL_TABLET | Freq: Every day | ORAL | Status: DC
  Administered 2021-03-14 – 2021-03-15 (×2): 2.5 mg via ORAL
  Filled 2021-03-14 (×2): qty 1

## 2021-03-14 NOTE — Evaluation (Signed)
Occupational Therapy Evaluation Patient Details Name: Sabrina [name] MRN: [id number] DOB: November 03, 1926 Today's Date: 03/14/2021   History of Present Illness Patient is a 88 y.o. female who is presenting from home due to worsening shortness of breath and work of breathing.  Patient is on 3L chronically at home and has 24/7 care from home aids. Pt's home nurse who noted she was having green sputum, increased work of breathing and a lot of wheezing. and pt brought to ED. Pt admitted for COPD and possible CHF exacerbation. PMH significant for for A. Fibrillation, DVT on Eliquis, CKD 4, DM 2, hypertension, chronic diastolic heart failure, COPD.   Clinical Impression   Sabrina [name] is a 88 year old woman who lives at home with 24/7 caregivers. She has assistance for transfers - she has been limited with ambulation since ankle injury and all ADLs. Currently patient presents with generalized weakness, decreased activity tolerance and impaired balance. Needing increased assistance for grooming, feeding and transfers. Patient will benefit from skilled OT services while in hospital to improve deficits and learn compensatory strategies as needed in order to return to PLOF.        Recommendations for follow up therapy are one component of a multi-disciplinary discharge planning process, led by the attending physician.  Recommendations may be updated based on patient status, additional functional criteria and insurance authorization.   Follow Up Recommendations  Home health OT    Assistance Recommended at Discharge Frequent or constant Supervision/Assistance  Patient can return home with the following A lot of help with walking and/or transfers;A lot of help with bathing/dressing/bathroom;Assistance with feeding    Functional Status Assessment  Patient has had a recent decline in their functional status and demonstrates the ability to make significant improvements in function in a reasonable and  predictable amount of time.  Equipment Recommendations  None recommended by OT    Recommendations for Other Services       Precautions / Restrictions Precautions Precautions: Fall Restrictions Weight Bearing Restrictions: No Other Position/Activity Restrictions: recent Rt ankle fracture (per caregiver pt cleared to wear ankle brace and no longer needs CAM boot)      Mobility Bed Mobility                    Transfers                          Balance Overall balance assessment: Needs assistance Sitting-balance support: Feet supported Sitting balance-Leahy Scale: Fair     Standing balance support: Reliant on assistive device for balance;During functional activity;Bilateral upper extremity supported Standing balance-Leahy Scale: Poor                             ADL either performed or assessed with clinical judgement   ADL Overall ADL's : Needs assistance/impaired Eating/Feeding: Sitting;Moderate assistance   Grooming: Moderate assistance;Sitting   Upper Body Bathing: Maximal assistance;Sitting   Lower Body Bathing: Total assistance;Sitting/lateral leans   Upper Body Dressing : Sitting;Maximal assistance   Lower Body Dressing: Total assistance;Sit to/from stand;+2 for physical assistance   Toilet Transfer: Minimal assistance;BSC/3in1;Rolling walker (2 wheels);+2 for safety/equipment   Toileting- Clothing Manipulation and Hygiene: Total assistance;Sit to/from stand         General ADL Comments: Max assist to transsfer to side of bed with patient providing minimal effort. Patient typically sleeps in a lift chair. Patient min assist to  rise from elevated bed height with RW. Patient min assist with +2 to safety to transfer to recliner - taking a couple of steps. Did require assistance to complete full turn and scoot back in to recliner.     Vision Patient Visual Report: No change from baseline       Perception     Praxis       Pertinent Vitals/Pain Pain Assessment: Faces Faces Pain Scale: Hurts a little bit Pain Location: generalized - grimacing and groaning Pain Descriptors / Indicators: Grimacing Pain Intervention(s): Limited activity within patient's tolerance     Hand Dominance Right   Extremity/Trunk Assessment Upper Extremity Assessment Upper Extremity Assessment: Generalized weakness   Lower Extremity Assessment Lower Extremity Assessment: Defer to PT evaluation   Cervical / Trunk Assessment Cervical / Trunk Assessment: Kyphotic   Communication Communication Communication: Expressive difficulties;Other (comment)   Cognition Arousal/Alertness: Awake/alert Behavior During Therapy: Flat affect Overall Cognitive Status: Within Functional Limits for tasks assessed                                       General Comments       Exercises     Shoulder Instructions      Home Living Family/patient expects to be discharged to:: Private residence Living Arrangements: Other (Comment) Available Help at Discharge: Personal care attendant;Available 24 hours/day Type of Home: House Home Access: Ramped entrance     Home Layout: One level     Bathroom Shower/Tub: Walk-in shower;Tub/shower unit   Bathroom Toilet: Standard Bathroom Accessibility: No   Home Equipment: Cane - single Barista (2 wheels);Shower seat;Wheelchair - manual;Tub bench   Additional Comments: pt no longer able to get into bathroom since ankle fracture. pt's caregivers using wheelchair to transfer pt from bed to [facility]      Prior Functioning/Environment Prior Level of Function : Needs assist       Physical Assist : Mobility (physical);ADLs (physical) Mobility (physical): Bed mobility;Transfers;Gait   Mobility Comments: assist for all mobility tasks since R fibula fx 11/29-transfers only currently. ADLs Comments: 1 person assist to to pivot bed<>wheelchair <> BSC, assistance needed for all  dressing, bathing, toileting - typically can feed herself.        OT Problem List: Decreased strength;Decreased activity tolerance;Impaired balance (sitting and/or standing);Decreased safety awareness;Decreased knowledge of use of DME or AE;Decreased knowledge of precautions;Obesity;Pain      OT Treatment/Interventions: Self-care/ADL training;Therapeutic exercise;DME and/or AE instruction;Therapeutic activities;Balance training;Patient/family education    OT Goals(Current goals can be found in the care plan section) Acute Rehab OT Goals OT Goal Formulation: With family Time For Goal Achievement: 03/28/21 Potential to Achieve Goals: Fair  OT Frequency: Min 2X/week    Co-evaluation              AM-PAC OT "6 Clicks" Daily Activity     Outcome Measure Help from another person eating meals?: A Lot Help from another person taking care of personal grooming?: A Lot Help from another person toileting, which includes using toliet, bedpan, or urinal?: Total Help from another person bathing (including washing, rinsing, drying)?: A Lot Help from another person to put on and taking off regular upper body clothing?: A Lot Help from another person to put on and taking off regular lower body clothing?: Total 6 Click Score: 10   End of Session Equipment Utilized During Treatment: Gait belt;Rolling walker (2 wheels)  Activity Tolerance:  Patient limited by fatigue Patient left: in chair;with call bell/phone within reach;with family/visitor present (caregiver present)  OT Visit Diagnosis: Other abnormalities of gait and mobility (R26.89);Muscle weakness (generalized) (M62.81)                Time: 8887-5797 OT Time Calculation (min): 23 min Charges:  OT General Charges $OT Visit: 1 Visit OT Evaluation $OT Eval Low Complexity: 1 Low  Jiya Kissinger, OTR/L [place]  Office [phone] Pager: [place] 03/14/2021, 3:02 PM

## 2021-03-15 LAB — CBC
HCT: 23.3 % — ABNORMAL LOW (ref 36.0–46.0)
Hemoglobin: 7.6 g/dL — ABNORMAL LOW (ref 12.0–15.0)
MCH: 32.2 pg (ref 26.0–34.0)
MCHC: 32.6 g/dL (ref 30.0–36.0)
MCV: 98.7 fL (ref 80.0–100.0)
Platelets: 209 10*3/uL (ref 150–400)
RBC: 2.36 MIL/uL — ABNORMAL LOW (ref 3.87–5.11)
RDW: 14.9 % (ref 11.5–15.5)
WBC: 9.3 10*3/uL (ref 4.0–10.5)
nRBC: 0 % (ref 0.0–0.2)

## 2021-03-15 LAB — GLUCOSE, CAPILLARY
Glucose-Capillary: 191 mg/dL — ABNORMAL HIGH (ref 70–99)
Glucose-Capillary: 292 mg/dL — ABNORMAL HIGH (ref 70–99)
Glucose-Capillary: 356 mg/dL — ABNORMAL HIGH (ref 70–99)
Glucose-Capillary: 298 mg/dL — ABNORMAL HIGH (ref 70–99)

## 2021-03-15 LAB — COMPREHENSIVE METABOLIC PANEL
ALT: 12 U/L (ref 0–44)
AST: 13 U/L — ABNORMAL LOW (ref 15–41)
Albumin: 3.1 g/dL — ABNORMAL LOW (ref 3.5–5.0)
Alkaline Phosphatase: 59 U/L (ref 38–126)
Anion gap: 10 (ref 5–15)
BUN: 103 mg/dL — ABNORMAL HIGH (ref 8–23)
CO2: 30 mmol/L (ref 22–32)
Calcium: 9.8 mg/dL (ref 8.9–10.3)
Chloride: 92 mmol/L — ABNORMAL LOW (ref 98–111)
Creatinine, Ser: 2.33 mg/dL — ABNORMAL HIGH (ref 0.44–1.00)
GFR, Estimated: 19 mL/min — ABNORMAL LOW (ref >60)
Glucose, Bld: 245 mg/dL — ABNORMAL HIGH (ref 70–99)
Potassium: 4.4 mmol/L (ref 3.5–5.1)
Sodium: 132 mmol/L — ABNORMAL LOW (ref 135–145)
Total Bilirubin: 1 mg/dL (ref 0.3–1.2)
Total Protein: 5.8 g/dL — ABNORMAL LOW (ref 6.5–8.1)

## 2021-03-15 MED ORDER — SENNOSIDES-DOCUSATE SODIUM 8.6-50 MG PO TABS: 1.0000 | ORAL_TABLET | Freq: Every evening | ORAL | 0 refills | Status: DC | PRN

## 2021-03-15 MED ORDER — CHLORHEXIDINE GLUCONATE CLOTH 2 % EX PADS
6.0000 | MEDICATED_PAD | Freq: Every day | CUTANEOUS | Status: DC
  Administered 2021-03-15: 6 via TOPICAL

## 2021-03-15 NOTE — Discharge Summary (Signed)
Physician Discharge Summary  Sabrina [name] FTD:[id number] DOB: 1926-03-27 DOA: 03/09/2021  PCP: Sabrina Manes, MD  Admit date: 03/09/2021 Discharge date: 03/15/2021  Admitted From: Home Disposition:  Home  Recommendations for Outpatient Follow-up:  Follow up with PCP in 1-2 weeks Follow up with Urology as scheduled for foley wean  Discharge Condition:Stable CODE STATUS:DNR Diet recommendation: Regular   Brief/Interim Summary: 88 years old female with PMH significant for A. Fibrillation, DVT on Eliquis, CKD 4, DM 2, hypertension, chronic diastolic heart failure, chronic hypoxic respiratory failure on 3 L of supplemental oxygen at baseline, COPD presented in the ED with shortness of breath.  History is obtained from ED chart since patient has baseline dementia.  Patient was found to have significant wheezing and was given breathing treatment, She went into A. fib with RVR.  She was given IV Cardizem and started on nitro gtt. for hypertensive urgency. Patient is admitted for COPD and possible CHF exacerbation. Cardiology consulted for elevated troponin,  recommended to continue current management,  No plan for any ischemic work-up at this point  Discharge Diagnoses:  Principal Problem:   COPD exacerbation ([place]) Active Problems:   Atrial fibrillation with RVR ([place])   Hypertensive urgency   Demand ischemia ([place])  Hypertensive urgency:> Resolved. Presented severely hypertensive with BP of 231/75 on arrival. Patient was started on nitro gtt., Blood pressure has since improved. She was resumed on her blood pressure medications. Nitro gtt. discontinued. Continued hydralazine, Imdur, Cardizem, clonidine, Lasix and home norvasc   Atrial fibrillation with RVR: Heart rate is now improved, converted to NSR Continued Cardizem 60 mg every 8 hours,  Continued on Eliquis.   COPD exacerbation: Chronic hypoxic respiratory failure: Patient remains on her baseline oxygen requirement. COVID,  influenza negative.  Chest x-ray unremarkable. Continued on steroids and nebulized bronchodilators as needed   CKD stage IV: Serum creatinine at baseline(2.3- 2.7) Avoid nephrotoxic medications.   Type 2 diabetes: Carb modified diet,  Hemoglobin A1c 8.8. Continued regular insulin sliding scale while in hospital   Hypercalcemia: Continue IV hydration.  Monitor serum calcium. Serum calcium improved.   Elevated troponin: Acute on chronic diastolic CHF: Patient denies any chest pain,  It could be due to demand ischemia. 2D echocardiogram unremarkable.  LVEF 55 to 60%. Continue Lasix 40 mg IV daily. Cardiology consulted.  Recommended no ischemic work-up needed. Elevated troponin could be due to demand ischemia. Remained stable   Macrocytic anemia: H&H is stable, no evidence of bleeding. B12 and folate within normal limits   Right ankle fracture: S/p fall, right ankle fracture, s/p ORIF Follow-up orthopedics outpatient.     Discharge Instructions   Allergies as of 03/15/2021       Reactions   Beta Adrenergic Blockers Other (See Comments)   Fatigue        Medication List     STOP taking these medications    doxycycline 100 MG tablet Commonly known as: VIBRA-TABS   predniSONE 20 MG tablet Commonly known as: DELTASONE       TAKE these medications    acetaminophen 500 MG tablet Commonly known as: TYLENOL Take 500 mg by mouth daily as needed for fever or moderate pain (for foot pain).   albuterol 108 (90 Base) MCG/ACT inhaler Commonly known as: VENTOLIN HFA Inhale 1-2 puffs into the lungs every 6 (six) hours as needed for wheezing or shortness of breath.   allopurinol 100 MG tablet Commonly known as: ZYLOPRIM Take 100 mg by mouth daily.   amLODipine 2.5  MG tablet Commonly known as: NORVASC Take 2.5 mg by mouth daily.   benzonatate 100 MG capsule Commonly known as: TESSALON Take 1 capsule (100 mg total) by mouth every 8 (eight) hours.    cholecalciferol 1000 units tablet Commonly known as: VITAMIN D Take 1,000 Units by mouth daily.   cloNIDine 0.2 MG tablet Commonly known as: CATAPRES Take 0.2 mg by mouth 2 (two) times daily.   diltiazem 60 MG tablet Commonly known as: CARDIZEM Take 1 tablet (60 mg total) by mouth every 8 (eight) hours.   Eliquis 2.5 MG Tabs tablet Generic drug: apixaban TAKE 1 TABLET BY MOUTH TWICE DAILY. What changed: how much to take   famotidine 20 MG tablet Commonly known as: PEPCID Take 20 mg by mouth at bedtime as needed for indigestion or heartburn.   furosemide 40 MG tablet Commonly known as: LASIX Take 40 mg by mouth daily. Per Dr. If over 166lb take 80mg  a day   gabapentin 100 MG capsule Commonly known as: NEURONTIN Take 200-300 mg by mouth See admin instructions. 2 capsules bid and 3 capsules at bedtime   glipiZIDE 5 MG tablet Commonly known as: GLUCOTROL Take 2.5-5 mg by mouth See admin instructions. Takes 1 tab every am and 1/2 tab every evening   hydrALAZINE 50 MG tablet Commonly known as: APRESOLINE Take 50 mg by mouth 3 (three) times daily.   ipratropium-albuterol 0.5-2.5 (3) MG/3ML Soln Commonly known as: DUONEB Take 3 mLs by nebulization every 6 (six) hours as needed (wheezing and shortness of breath).   isosorbide mononitrate 30 MG 24 hr tablet Commonly known as: IMDUR Take 1 tablet (30 mg total) by mouth daily.   levalbuterol 0.63 MG/3ML nebulizer solution Commonly known as: XOPENEX Take 0.63 mg by nebulization every 4 (four) hours as needed for wheezing or shortness of breath.   mometasone-formoterol 200-5 MCG/ACT Aero Commonly known as: DULERA Inhale 2 puffs into the lungs 2 (two) times daily.   ondansetron 8 MG disintegrating tablet Commonly known as: ZOFRAN-ODT Take 8 mg by mouth 2 (two) times daily as needed for nausea or vomiting.   senna-docusate 8.6-50 MG tablet Commonly known as: Senokot-S Take 1 tablet by mouth at bedtime as needed for mild  constipation.   True Metrix Blood Glucose Test test strip Generic drug: glucose blood   TRUEplus Lancets 28G Misc Apply topically.               Durable Medical Equipment  (From admission, onward)           Start     Ordered   03/15/21 0937  For home use only DME Hospital bed  Once       Question Answer Comment  Length of Need Lifetime   Head must be elevated greater than: 45 degrees   Bed type Semi-electric      03/15/21 0937            Follow-up Information     Stoneking, Hal, MD Follow up in 2 week(s).   Specialty: Internal Medicine Why: Hospital follow up Contact information: [address] [place] [address] [phone]         Nahser, Wonda Cheng, MD .   Specialty: Cardiology Contact information: [place] [address] [phone]         Evans Lance, MD .   Specialty: Cardiology Contact information: [phone] [address] [place] [address] [phone]  [facility] Follow up in 1 week(s).   Why: Hospital follow up Contact information: [place] [address] [phone]               Allergies  Allergen Reactions   Beta Adrenergic Blockers Other (See Comments)    Fatigue   Procedures/Studies: DG Chest 2 View  Result Date: 03/04/2021 CLINICAL DATA:  Shortness of breath. EXAM: CHEST - 2 VIEW COMPARISON:  February 10, 2021, August 15, 2017 FINDINGS: The heart size and mediastinal contours are stable. There are question 2-3 mm nodules in the left mid lung. There is no focal infiltrate, pulmonary edema. There is minimal right posterior pleural effusion. The visualized skeletal structures are stable. Stable right breast and axillary surgical clips are noted. IMPRESSION: No focal pneumonia. Question 2-3 mm nodules in the left mid lung. Recommend further evaluation with CT chest on outpatient basis. Electronically  Signed   By: Abelardo Diesel M.D.   On: 03/04/2021 11:07   CT CHEST WO CONTRAST  Result Date: 03/14/2021 CLINICAL DATA:  Worsening shortness of breath. EXAM: CT CHEST WITHOUT CONTRAST TECHNIQUE: Multidetector CT imaging of the chest was performed following the standard protocol without IV contrast. RADIATION DOSE REDUCTION: This exam was performed according to the departmental dose-optimization program which includes automated exposure control, adjustment of the mA and/or kV according to patient size and/or use of iterative reconstruction technique. COMPARISON:  CT chest dated February 11, 2021. FINDINGS: Cardiovascular: No significant vascular findings. Normal heart size. No pericardial effusion. Unchanged dense mitral annular calcification. No thoracic aortic aneurysm. Coronary, aortic arch, and branch vessel atherosclerotic vascular disease. Mediastinum/Nodes: No enlarged mediastinal or axillary lymph nodes. Thyroid gland and esophagus demonstrate no significant findings. Lungs/Pleura: Small amount of secretions in the trachea. Small right pleural effusion, increased in size since December. Unchanged trace left pleural effusion. Similar peripheral post radiation change in the right upper lobe. New small nodules and ill-defined ground-glass densities throughout both lungs with more consolidative areas in the right middle and lower lobes. Chronic dependent atelectasis in the posterior right greater than left lower lobes. Mild smooth interlobular septal thickening. No pneumothorax. Upper Abdomen: No acute abnormality. Musculoskeletal: No acute or significant osseous findings. IMPRESSION: 1. New small nodules and ill-defined ground-glass densities throughout both lungs with more consolidative areas in the right middle and lower lobes, concerning for multifocal pneumonia. 2. Small right pleural effusion, increased in size since December. Unchanged trace left pleural effusion. New mild interstitial pulmonary edema.  3. Aortic Atherosclerosis (ICD10-I70.0). Electronically Signed   By: Titus Dubin M.D.   On: 03/14/2021 16:01   DG Chest Port 1 View  Result Date: 03/09/2021 CLINICAL DATA:  Shortness of breath. EXAM: PORTABLE CHEST 1 VIEW COMPARISON:  Radiograph 5 days ago 03/04/2021 chest CT 02/11/2021 FINDINGS: Patient's chin obscures the apices.The cardiomediastinal contours are unchanged. Some of the previous question left pulmonary nodules are again seen, some are obscured on the current exam. Planted loop recorder in the left chest wall. Pulmonary vasculature is normal. No consolidation, pleural effusion, or pneumothorax. No acute osseous abnormalities are seen. Surgical clips in the right axilla. IMPRESSION: 1. No acute abnormality. 2. Some of the previously questioned tiny left pulmonary nodules are again seen. Electronically Signed   By: Keith Rake M.D.   On: 03/09/2021 22:58   ECHOCARDIOGRAM COMPLETE  Result Date: 03/10/2021    ECHOCARDIOGRAM REPORT   Patient Name:   Sabrina [name] [facility] Date of Exam: 03/10/2021 Medical Rec #:  [id number]          Height:       59.0 in Accession #:    [id number]         Weight:       163.6 lb Date of Birth:  October 16, 1926          BSA:          1.693 m Patient Age:    65 years           BP:           169/48 mmHg Patient Gender: F                  HR:           88 bpm. Exam Location:  Inpatient Procedure: 2D Echo, Cardiac Doppler, Color Doppler and Intracardiac            Opacification Agent Indications:    Elevated Troponin  History:        Patient has prior history of Echocardiogram examinations, most                 recent 12/19/2020. Pacemaker, COPD and Stroke, Arrythmias:Atrial                 Fibrillation, Signs/Symptoms:Edema; Risk Factors:Diabetes and                 Hypertension.  Sonographer:    Glo Herring Referring Phys: [id number] [place]  1. Left ventricular ejection fraction, by estimation, is 60 to 65%. The left ventricle has normal function. The  left ventricle has no regional wall motion abnormalities.  2. Right ventricular systolic function is normal. The right ventricular size is normal.  3. MV is thickened. Peakd and mean gradients through the valve are 13 and 6 mm Hg with MVA by VTI of 1.5 cm2 consistent with mild to moderate MS. No significant change in gradients from report from 12/19/20. . The mitral valve is degenerative. Trivial mitral valve regurgitation. Moderate to severe mitral annular calcification.  4. AV is thickened, calcified with restricted motion. Peak and mean gradients through the valve are 49 and 27 mm HG respectively. AVA (VTI) 1.04 cm2 Dimensionless index is 0.37. Consistent with moderate AS. No significant change from mean gradient from 12/19/20 (25 to 27 mm Hg). Aortic valve regurgitation is mild.  5. The inferior vena cava is dilated in size with <50% respiratory variability, suggesting right atrial pressure of 15 mmHg. FINDINGS  Left Ventricle: Left ventricular ejection fraction, by estimation, is 60 to 65%. The left ventricle has normal function. The left ventricle has no regional wall motion abnormalities. The left ventricular internal cavity size was normal in size. Right Ventricle: The right ventricular size is normal. Right vetricular wall thickness was not assessed. Right ventricular systolic function is normal. Right Atrium: Right atrial size was normal in size. Pericardium: There is no evidence of pericardial effusion. Mitral Valve: MV is thickened. Peakd and mean gradients through the valve are 13 and 6 mm Hg with MVA by VTI of 1.5 cm2 consistent with mild to moderate MS. No significant change in gradients from report from 12/19/20. The mitral valve is degenerative in  appearance. Moderate to severe mitral annular calcification. Trivial mitral valve regurgitation. MV peak gradient, 14.3 mmHg. The mean mitral valve gradient is 6.3 mmHg. Tricuspid Valve: The tricuspid valve is normal in structure. Tricuspid valve  regurgitation is mild. Aortic Valve: AV is thickened, calcified with restricted motion. Peak  and mean gradients through the valve are 49 and 27 mm HG respectively. AVA (VTI) 1.04 cm2 Dimensionless index is 0.37. Consistent with moderate AS. No significant change from mean gradient from 12/19/20 (25 to 27 mm Hg). Aortic valve regurgitation is mild. Aortic valve mean gradient measures 22.0 mmHg. Aortic valve peak gradient measures 39.9 mmHg. Aortic valve area, by VTI measures 1.05 cm. Pulmonic Valve: The pulmonic valve was not well visualized. Pulmonic valve regurgitation is mild. No evidence of pulmonic stenosis. Aorta: The aortic root and ascending aorta are structurally normal, with no evidence of dilitation. Venous: The inferior vena cava is dilated in size with less than 50% respiratory variability, suggesting right atrial pressure of 15 mmHg. IAS/Shunts: The interatrial septum was not assessed.  LEFT VENTRICLE PLAX 2D LVOT diam:     1.90 cm LV SV:         66 LV SV Index:   39 LVOT Area:     2.84 cm  RIGHT VENTRICLE             IVC RV Basal diam:  4.20 cm     IVC diam: 2.60 cm RV S prime:     15.90 cm/s RIGHT ATRIUM           Index RA Area:     12.10 cm RA Volume:   29.20 ml  17.24 ml/m  AORTIC VALVE                     PULMONIC VALVE AV Area (Vmax):    0.93 cm      PV Vmax:       1.33 m/s AV Area (Vmean):   0.89 cm      PV Peak grad:  7.1 mmHg AV Area (VTI):     1.05 cm AV Vmax:           316.00 cm/s AV Vmean:          221.000 cm/s AV VTI:            0.629 m AV Peak Grad:      39.9 mmHg AV Mean Grad:      22.0 mmHg LVOT Vmax:         103.50 cm/s LVOT Vmean:        69.700 cm/s LVOT VTI:          0.232 m LVOT/AV VTI ratio: 0.37  AORTA Ao Root diam: 2.80 cm Ao Asc diam:  2.60 cm MITRAL VALVE MV Area (PHT): 3.70 cm     SHUNTS MV Area VTI:   1.51 cm     Systemic VTI:  0.23 m MV Peak grad:  14.3 mmHg    Systemic Diam: 1.90 cm MV Mean grad:  6.3 mmHg MV Vmax:       1.89 m/s MV Vmean:      115.7 cm/s MV Decel  Time: 205 msec MV E velocity: 183.00 cm/s MV A velocity: 114.00 cm/s MV E/A ratio:  1.61 Dorris Carnes MD Electronically signed by Dorris Carnes MD Signature Date/Time: 03/10/2021/1:12:21 PM    Final     Subjective: Without complaints  Discharge Exam: Vitals:   03/14/21 2231 03/15/21 0555  BP: (!) 154/48 (!) 163/60  Pulse: 72 79  Resp: 20 16  Temp: 99 F (37.2 C) 98.7 F (37.1 C)  SpO2: 100% 97%   Vitals:   03/14/21 1204 03/14/21 1744 03/14/21 2231 03/15/21 0555  BP: (!) 135/45 (!) 146/51 (!) 154/48 (!) 163/60  Pulse: 63  68 72 79  Resp: 16 18 20 16   Temp: 98.3 F (36.8 C)  99 F (37.2 C) 98.7 F (37.1 C)  TempSrc: Oral  Oral Oral  SpO2: 100%  100% 97%  Weight:      Height:        General: Pt is alert, awake, not in acute distress Cardiovascular: RRR, S1/S2 + Respiratory: CTA bilaterally, no wheezing, no rhonchi Abdominal: Soft, NT, ND, bowel sounds + Extremities: no edema, no cyanosis   The results of significant diagnostics from this hospitalization (including imaging, microbiology, ancillary and laboratory) are listed below for reference.     Microbiology: Recent Results (from the past 240 hour(s))  Resp Panel by RT-PCR (Flu A&B, Covid) Nasopharyngeal Swab     Status: None   Collection Time: 03/09/21  9:40 PM   Specimen: Nasopharyngeal Swab; Nasopharyngeal(NP) swabs in vial transport medium  Result Value Ref Range Status   SARS Coronavirus 2 by RT PCR NEGATIVE NEGATIVE Final    Comment: (NOTE) SARS-CoV-2 target nucleic acids are NOT DETECTED.  The SARS-CoV-2 RNA is generally detectable in upper respiratory specimens during the acute phase of infection. The lowest concentration of SARS-CoV-2 viral copies this assay can detect is 138 copies/mL. A negative result does not preclude SARS-Cov-2 infection and should not be used as the sole basis for treatment or other patient management decisions. A negative result may occur with  improper specimen collection/handling,  submission of specimen other than nasopharyngeal swab, presence of viral mutation(s) within the areas targeted by this assay, and inadequate number of viral copies(<138 copies/mL). A negative result must be combined with clinical observations, patient history, and epidemiological information. The expected result is Negative.  Fact Sheet for Patients:  EntrepreneurPulse.com.au  Fact Sheet for Healthcare Providers:  IncredibleEmployment.be  This test is no t yet approved or cleared by the [place] FDA and  has been authorized for detection and/or diagnosis of SARS-CoV-2 by FDA under an Emergency Use Authorization (EUA). This EUA will remain  in effect (meaning this test can be used) for the duration of the COVID-19 declaration under Section 564(b)(1) of the Act, 21 U.S.C.section 360bbb-3(b)(1), unless the authorization is terminated  or revoked sooner.       Influenza A by PCR NEGATIVE NEGATIVE Final   Influenza B by PCR NEGATIVE NEGATIVE Final    Comment: (NOTE) The Xpert Xpress SARS-CoV-2/FLU/RSV plus assay is intended as an aid in the diagnosis of influenza from Nasopharyngeal swab specimens and should not be used as a sole basis for treatment. Nasal washings and aspirates are unacceptable for Xpert Xpress SARS-CoV-2/FLU/RSV testing.  Fact Sheet for Patients: EntrepreneurPulse.com.au  Fact Sheet for Healthcare Providers: IncredibleEmployment.be  This test is not yet approved or cleared by the [place] FDA and has been authorized for detection and/or diagnosis of SARS-CoV-2 by FDA under an Emergency Use Authorization (EUA). This EUA will remain in effect (meaning this test can be used) for the duration of the COVID-19 declaration under Section 564(b)(1) of the Act, 21 U.S.C. section 360bbb-3(b)(1), unless the authorization is terminated or revoked.  Performed at [facility], [place] [address] [place], [place] [address]   Respiratory (~20 pathogens) panel by PCR     Status: None   Collection Time: 03/10/21  3:11 PM   Specimen: Nasopharyngeal Swab; Respiratory  Result Value Ref Range Status   Adenovirus NOT DETECTED NOT DETECTED Final   Coronavirus 229E NOT DETECTED NOT DETECTED Final    Comment: (NOTE) The Coronavirus  on the Respiratory Panel, DOES NOT test for the novel  Coronavirus (2019 nCoV)    Coronavirus HKU1 NOT DETECTED NOT DETECTED Final   Coronavirus NL63 NOT DETECTED NOT DETECTED Final   Coronavirus OC43 NOT DETECTED NOT DETECTED Final   Metapneumovirus NOT DETECTED NOT DETECTED Final   Rhinovirus / Enterovirus NOT DETECTED NOT DETECTED Final   Influenza A NOT DETECTED NOT DETECTED Final   Influenza B NOT DETECTED NOT DETECTED Final   Parainfluenza Virus 1 NOT DETECTED NOT DETECTED Final   Parainfluenza Virus 2 NOT DETECTED NOT DETECTED Final   Parainfluenza Virus 3 NOT DETECTED NOT DETECTED Final   Parainfluenza Virus 4 NOT DETECTED NOT DETECTED Final   Respiratory Syncytial Virus NOT DETECTED NOT DETECTED Final   Bordetella pertussis NOT DETECTED NOT DETECTED Final   Bordetella Parapertussis NOT DETECTED NOT DETECTED Final   Chlamydophila pneumoniae NOT DETECTED NOT DETECTED Final   Mycoplasma pneumoniae NOT DETECTED NOT DETECTED Final    Comment: Performed at [facility], [place] [address] [place], [place] [address]     Labs: BNP (last 3 results) Recent Labs    02/07/21 1611 03/04/21 0954 03/09/21 2124  BNP 265.8* 323.0* 621.3*   Basic Metabolic Panel: Recent Labs  Lab 03/11/21 0240 03/12/21 0247 03/13/21 0301 03/14/21 0242 03/15/21 0340  NA 135 132* 131* 130* 132*  K 4.2 4.6 4.3 4.1 4.4  CL 92* 91* 90* 90* 92*  CO2 33* 32 31 30 30   GLUCOSE 299* 270* 219* 188* 245*  BUN 73* 90* 100* 100* 103*  CREATININE 2.61* 2.83* 2.71* 2.57* 2.33*  CALCIUM 11.1* 10.3 10.3   10.1 9.8 9.8  MG  --  2.7*  --  2.6*  --    PHOS  --  4.5  --  4.1  --    Liver Function Tests: Recent Labs  Lab 03/10/21 0800 03/11/21 0240 03/15/21 0340  AST 17 15 13*  ALT 17 14 12   ALKPHOS 92 75 59  BILITOT 1.1 0.7 1.0  PROT 7.3 6.7 5.8*  ALBUMIN 3.7 3.6 3.1*   No results for input(s): LIPASE, AMYLASE in the last 168 hours. No results for input(s): AMMONIA in the last 168 hours. CBC: Recent Labs  Lab 03/09/21 2124 03/10/21 0800 03/11/21 0240 03/12/21 0247 03/12/21 0919 03/13/21 0301 03/14/21 0242 03/15/21 0340  WBC 9.8 10.1 13.5* 12.7*  --  10.8* 9.1 9.3  NEUTROABS 5.5 8.6*  --   --   --   --   --   --   HGB 9.2* 9.3* 8.4* 7.5* 7.8* 8.0* 7.9* 7.6*  HCT 29.5* 28.4* 26.5* 23.2* 23.6* 24.3* 23.9* 23.3*  MCV 100.7* 100.0 101.5* 100.9*  --  99.2 98.8 98.7  PLT 279 285 277 240  --  253 244 209   Cardiac Enzymes: No results for input(s): CKTOTAL, CKMB, CKMBINDEX, TROPONINI in the last 168 hours. BNP: Invalid input(s): POCBNP CBG: Recent Labs  Lab 03/14/21 0819 03/14/21 1640 03/14/21 2234 03/15/21 0828 03/15/21 1259  GLUCAP 157* 388* 369* 191* 292*   D-Dimer No results for input(s): DDIMER in the last 72 hours. Hgb A1c No results for input(s): HGBA1C in the last 72 hours. Lipid Profile No results for input(s): CHOL, HDL, LDLCALC, TRIG, CHOLHDL, LDLDIRECT in the last 72 hours. Thyroid function studies No results for input(s): TSH, T4TOTAL, T3FREE, THYROIDAB in the last 72 hours.  Invalid input(s): FREET3 Anemia work up Recent Labs    03/13/21 0300  VITAMINB12 496  FOLATE 6.7  Urinalysis    Component Value Date/Time   COLORURINE YELLOW 02/07/2021 2200   APPEARANCEUR CLEAR 02/07/2021 2200   LABSPEC <=1.005 02/07/2021 2200   PHURINE 6.0 02/07/2021 2200   GLUCOSEU 100 (A) 02/07/2021 2200   HGBUR NEGATIVE 02/07/2021 2200   BILIRUBINUR NEGATIVE 02/07/2021 2200   KETONESUR NEGATIVE 02/07/2021 2200   PROTEINUR NEGATIVE 02/07/2021 2200   UROBILINOGEN 0.2 09/13/2013 0523   NITRITE NEGATIVE  02/07/2021 2200   LEUKOCYTESUR SMALL (A) 02/07/2021 2200   Sepsis Labs Invalid input(s): PROCALCITONIN,  WBC,  LACTICIDVEN Microbiology Recent Results (from the past 240 hour(s))  Resp Panel by RT-PCR (Flu A&B, Covid) Nasopharyngeal Swab     Status: None   Collection Time: 03/09/21  9:40 PM   Specimen: Nasopharyngeal Swab; Nasopharyngeal(NP) swabs in vial transport medium  Result Value Ref Range Status   SARS Coronavirus 2 by RT PCR NEGATIVE NEGATIVE Final    Comment: (NOTE) SARS-CoV-2 target nucleic acids are NOT DETECTED.  The SARS-CoV-2 RNA is generally detectable in upper respiratory specimens during the acute phase of infection. The lowest concentration of SARS-CoV-2 viral copies this assay can detect is 138 copies/mL. A negative result does not preclude SARS-Cov-2 infection and should not be used as the sole basis for treatment or other patient management decisions. A negative result may occur with  improper specimen collection/handling, submission of specimen other than nasopharyngeal swab, presence of viral mutation(s) within the areas targeted by this assay, and inadequate number of viral copies(<138 copies/mL). A negative result must be combined with clinical observations, patient history, and epidemiological information. The expected result is Negative.  Fact Sheet for Patients:  EntrepreneurPulse.com.au  Fact Sheet for Healthcare Providers:  IncredibleEmployment.be  This test is no t yet approved or cleared by the [place] FDA and  has been authorized for detection and/or diagnosis of SARS-CoV-2 by FDA under an Emergency Use Authorization (EUA). This EUA will remain  in effect (meaning this test can be used) for the duration of the COVID-19 declaration under Section 564(b)(1) of the Act, 21 U.S.C.section 360bbb-3(b)(1), unless the authorization is terminated  or revoked sooner.       Influenza A by PCR NEGATIVE NEGATIVE  Final   Influenza B by PCR NEGATIVE NEGATIVE Final    Comment: (NOTE) The Xpert Xpress SARS-CoV-2/FLU/RSV plus assay is intended as an aid in the diagnosis of influenza from Nasopharyngeal swab specimens and should not be used as a sole basis for treatment. Nasal washings and aspirates are unacceptable for Xpert Xpress SARS-CoV-2/FLU/RSV testing.  Fact Sheet for Patients: EntrepreneurPulse.com.au  Fact Sheet for Healthcare Providers: IncredibleEmployment.be  This test is not yet approved or cleared by the [place] FDA and has been authorized for detection and/or diagnosis of SARS-CoV-2 by FDA under an Emergency Use Authorization (EUA). This EUA will remain in effect (meaning this test can be used) for the duration of the COVID-19 declaration under Section 564(b)(1) of the Act, 21 U.S.C. section 360bbb-3(b)(1), unless the authorization is terminated or revoked.  Performed at [facility], [place] [address] [place], [place] [address]   Respiratory (~20 pathogens) panel by PCR     Status: None   Collection Time: 03/10/21  3:11 PM   Specimen: Nasopharyngeal Swab; Respiratory  Result Value Ref Range Status   Adenovirus NOT DETECTED NOT DETECTED Final   Coronavirus 229E NOT DETECTED NOT DETECTED Final    Comment: (NOTE) The Coronavirus on the Respiratory Panel, DOES NOT test for the novel  Coronavirus (2019 nCoV)  Coronavirus HKU1 NOT DETECTED NOT DETECTED Final   Coronavirus NL63 NOT DETECTED NOT DETECTED Final   Coronavirus OC43 NOT DETECTED NOT DETECTED Final   Metapneumovirus NOT DETECTED NOT DETECTED Final   Rhinovirus / Enterovirus NOT DETECTED NOT DETECTED Final   Influenza A NOT DETECTED NOT DETECTED Final   Influenza B NOT DETECTED NOT DETECTED Final   Parainfluenza Virus 1 NOT DETECTED NOT DETECTED Final   Parainfluenza Virus 2 NOT DETECTED NOT DETECTED Final   Parainfluenza Virus 3 NOT DETECTED NOT DETECTED  Final   Parainfluenza Virus 4 NOT DETECTED NOT DETECTED Final   Respiratory Syncytial Virus NOT DETECTED NOT DETECTED Final   Bordetella pertussis NOT DETECTED NOT DETECTED Final   Bordetella Parapertussis NOT DETECTED NOT DETECTED Final   Chlamydophila pneumoniae NOT DETECTED NOT DETECTED Final   Mycoplasma pneumoniae NOT DETECTED NOT DETECTED Final    Comment: Performed at [facility], [place] [address], [place],  [address]   Time spent: 30 min  SIGNED:   Marylu Lund, MD  [facility] 03/15/2021, 2:24 PM  If 7PM-7AM, please contact night-coverage

## 2021-03-15 NOTE — TOC Progression Note (Signed)
Transition of Care [facility]) - Progression Note    Patient Details  Name: Sabrina [name] MRN: [id number] Date of Birth: 04-Feb-1927  Transition of [facility]) CM/SW Contact  Purcell Mouton, RN Phone Number: 03/15/2021, 2:26 PM  Clinical Narrative:     Corey Harold was called.   Expected Discharge Plan: [place] Barriers to Discharge: Continued Medical Work up  Expected Discharge Plan and Services Expected Discharge Plan: [place] Choice: [place] arrangements for the past 2 months: Single Family Home                           HH Arranged: PT, OT HH Agency: [place] Date [facility] Agency Contacted: 03/13/21 Time [place]: 1656 Representative spoke with at [place]: [place] ([place]) Interventions    Readmission Risk Interventions Readmission Risk Prevention Plan 12/22/2020 12/20/2020  Transportation Screening Complete Complete  PCP or Specialist Appt within 5-7 Days Complete Complete  Home Care Screening Complete Complete  Medication Review (RN CM) Complete Complete  Some recent data might be hidden

## 2021-03-15 NOTE — Care Management (Cosign Needed)
°  °  Durable Medical Equipment  (From admission, onward)           Start     Ordered   03/15/21 0937  For home use only DME Hospital bed  Once       Question Answer Comment  Length of Need Lifetime   Head must be elevated greater than: 45 degrees   Bed type Semi-electric      03/15/21 0937

## 2021-03-15 NOTE — Progress Notes (Signed)
PROGRESS NOTE    Sabrina [name]  TML:[id number] DOB: 30-Jan-1927 DOA: 03/09/2021 PCP: [name], MD    Brief Narrative:  88 years old female with PMH significant for A. Fibrillation, DVT on Eliquis, CKD 4, DM 2, hypertension, chronic diastolic heart failure, chronic hypoxic respiratory failure on 3 L of supplemental oxygen at baseline, COPD presented in the ED with shortness of breath.  History is obtained from ED chart since patient has baseline dementia.  Patient was found to have significant wheezing and was given breathing treatment, She went into A. fib with RVR.  She was given IV Cardizem and started on nitro gtt. for hypertensive urgency. Patient is admitted for COPD and possible CHF exacerbation. Cardiology consulted for elevated troponin,  recommended to continue current management,  No plan for any ischemic work-up at this point.  Assessment & Plan:   Principal Problem:   COPD exacerbation ([place]) Active Problems:   Atrial fibrillation with RVR ([place])   Hypertensive urgency   Demand ischemia ([place])   Hypertensive urgency:> Resolved. She was severely hypertensive.  BP 231/75 on arrival. Patient was started on nitro gtt., Blood pressure has since improved. She was resumed on her blood pressure medications. Nitro gtt. discontinued. Continued hydralazine, Imdur, Cardizem, clonidine, Lasix and home norvasc   Atrial fibrillation with RVR: Heart rate is now improved, converted to NSR Continued Cardizem 60 mg every 8 hours,  Continue Eliquis.   COPD exacerbation: Chronic hypoxic respiratory failure: Patient remains on her baseline oxygen requirement. COVID, influenza negative.  Chest x-ray unremarkable. Continue steroids and nebulized bronchodilators as needed   CKD stage IV: Serum creatinine at baseline(2.3- 2.7) Avoid nephrotoxic medications.   Type 2 diabetes: Carb modified diet,  Hemoglobin A1c 8.8. Continued regular insulin sliding scale while in hospital    Hypercalcemia: Continue IV hydration.  Monitor serum calcium. Serum calcium improved.   Elevated troponin: Acute on chronic diastolic CHF: Patient denies any chest pain,  It could be due to demand ischemia. 2D echocardiogram unremarkable.  LVEF 55 to 60%. Continue Lasix 40 mg IV daily. Cardiology consulted.  Recommended no ischemic work-up needed. Elevated troponin could be due to demand ischemia. Remained stable   Macrocytic anemia: H&H is stable, no evidence of bleeding. B12 and folate within normal limits   Right ankle fracture: S/p fall, right ankle fracture, s/p ORIF Follow-up orthopedics outpatient.  DVT prophylaxis: Eliquis Code Status: DNR Family Communication: Pt in room  Status is: Inpatient  Remains inpatient appropriate because: severity of illness    Consultants:  Cardiology  Procedures:    Antimicrobials: Anti-infectives (From admission, onward)    Start     Dose/Rate Route Frequency Ordered Stop   03/10/21 2200  doxycycline (VIBRA-TABS) tablet 100 mg  Status:  Discontinued        100 mg Oral 2 times daily 03/10/21 1703 03/10/21 2146       Subjective: Tired appearing, difficult to assess  Objective: Vitals:   03/14/21 1204 03/14/21 1744 03/14/21 2231 03/15/21 0555  BP: (!) 135/45 (!) 146/51 (!) 154/48 (!) 163/60  Pulse: 63 68 72 79  Resp: 16 18 20 16   Temp: 98.3 F (36.8 C)  99 F (37.2 C) 98.7 F (37.1 C)  TempSrc: Oral  Oral Oral  SpO2: 100%  100% 97%  Weight:      Height:        Intake/Output Summary (Last 24 hours) at 03/15/2021 1009 Last data filed at 03/14/2021 1100 Gross per 24 hour  Intake --  Output 600 ml  Net -600 ml   Filed Weights   03/12/21 0500 03/13/21 0500 03/14/21 0500  Weight: 71.2 kg 71 kg 75.1 kg    Examination: General exam: Asleep, arousable, laying in bed, in nad Respiratory system: Normal respiratory effort, no wheezing Cardiovascular system: regular rate, s1, s2 Gastrointestinal system: Soft,  nondistended, positive BS Central nervous system: CN2-12 grossly intact, strength intact Extremities: Perfused, no clubbing Skin: Normal skin turgor, no notable skin lesions seen Psychiatry: unable to assess given mentation  Data Reviewed: I have personally reviewed following labs and imaging studies  CBC: Recent Labs  Lab 03/09/21 2124 03/10/21 0800 03/11/21 0240 03/12/21 0247 03/12/21 0919 03/13/21 0301 03/14/21 0242 03/15/21 0340  WBC 9.8 10.1 13.5* 12.7*  --  10.8* 9.1 9.3  NEUTROABS 5.5 8.6*  --   --   --   --   --   --   HGB 9.2* 9.3* 8.4* 7.5* 7.8* 8.0* 7.9* 7.6*  HCT 29.5* 28.4* 26.5* 23.2* 23.6* 24.3* 23.9* 23.3*  MCV 100.7* 100.0 101.5* 100.9*  --  99.2 98.8 98.7  PLT 279 285 277 240  --  253 244 646   Basic Metabolic Panel: Recent Labs  Lab 03/11/21 0240 03/12/21 0247 03/13/21 0301 03/14/21 0242 03/15/21 0340  NA 135 132* 131* 130* 132*  K 4.2 4.6 4.3 4.1 4.4  CL 92* 91* 90* 90* 92*  CO2 33* 32 31 30 30   GLUCOSE 299* 270* 219* 188* 245*  BUN 73* 90* 100* 100* 103*  CREATININE 2.61* 2.83* 2.71* 2.57* 2.33*  CALCIUM 11.1* 10.3 10.3   10.1 9.8 9.8  MG  --  2.7*  --  2.6*  --   PHOS  --  4.5  --  4.1  --    GFR: Estimated Creatinine Clearance: 13.6 mL/min (A) (by C-G formula based on SCr of 2.33 mg/dL (H)). Liver Function Tests: Recent Labs  Lab 03/10/21 0800 03/11/21 0240 03/15/21 0340  AST 17 15 13*  ALT 17 14 12   ALKPHOS 92 75 59  BILITOT 1.1 0.7 1.0  PROT 7.3 6.7 5.8*  ALBUMIN 3.7 3.6 3.1*   No results for input(s): LIPASE, AMYLASE in the last 168 hours. No results for input(s): AMMONIA in the last 168 hours. Coagulation Profile: No results for input(s): INR, PROTIME in the last 168 hours. Cardiac Enzymes: No results for input(s): CKTOTAL, CKMB, CKMBINDEX, TROPONINI in the last 168 hours. BNP (last 3 results) No results for input(s): PROBNP in the last 8760 hours. HbA1C: No results for input(s): HGBA1C in the last 72 hours. CBG: Recent  Labs  Lab 03/13/21 2141 03/14/21 0819 03/14/21 1640 03/14/21 2234 03/15/21 0828  GLUCAP 267* 157* 388* 369* 191*   Lipid Profile: No results for input(s): CHOL, HDL, LDLCALC, TRIG, CHOLHDL, LDLDIRECT in the last 72 hours. Thyroid Function Tests: No results for input(s): TSH, T4TOTAL, FREET4, T3FREE, THYROIDAB in the last 72 hours. Anemia Panel: Recent Labs    03/13/21 0300  VITAMINB12 496  FOLATE 6.7   Sepsis Labs: No results for input(s): PROCALCITON, LATICACIDVEN in the last 168 hours.  Recent Results (from the past 240 hour(s))  Resp Panel by RT-PCR (Flu A&B, Covid) Nasopharyngeal Swab     Status: None   Collection Time: 03/09/21  9:40 PM   Specimen: Nasopharyngeal Swab; Nasopharyngeal(NP) swabs in vial transport medium  Result Value Ref Range Status   SARS Coronavirus 2 by RT PCR NEGATIVE NEGATIVE Final    Comment: (NOTE) SARS-CoV-2 target nucleic acids are  NOT DETECTED.  The SARS-CoV-2 RNA is generally detectable in upper respiratory specimens during the acute phase of infection. The lowest concentration of SARS-CoV-2 viral copies this assay can detect is 138 copies/mL. A negative result does not preclude SARS-Cov-2 infection and should not be used as the sole basis for treatment or other patient management decisions. A negative result may occur with  improper specimen collection/handling, submission of specimen other than nasopharyngeal swab, presence of viral mutation(s) within the areas targeted by this assay, and inadequate number of viral copies(<138 copies/mL). A negative result must be combined with clinical observations, patient history, and epidemiological information. The expected result is Negative.  Fact Sheet for Patients:  EntrepreneurPulse.com.au  Fact Sheet for Healthcare Providers:  IncredibleEmployment.be  This test is no t yet approved or cleared by the [place] FDA and  has been authorized for  detection and/or diagnosis of SARS-CoV-2 by FDA under an Emergency Use Authorization (EUA). This EUA will remain  in effect (meaning this test can be used) for the duration of the COVID-19 declaration under Section 564(b)(1) of the Act, 21 U.S.C.section 360bbb-3(b)(1), unless the authorization is terminated  or revoked sooner.       Influenza A by PCR NEGATIVE NEGATIVE Final   Influenza B by PCR NEGATIVE NEGATIVE Final    Comment: (NOTE) The Xpert Xpress SARS-CoV-2/FLU/RSV plus assay is intended as an aid in the diagnosis of influenza from Nasopharyngeal swab specimens and should not be used as a sole basis for treatment. Nasal washings and aspirates are unacceptable for Xpert Xpress SARS-CoV-2/FLU/RSV testing.  Fact Sheet for Patients: EntrepreneurPulse.com.au  Fact Sheet for Healthcare Providers: IncredibleEmployment.be  This test is not yet approved or cleared by the [place] FDA and has been authorized for detection and/or diagnosis of SARS-CoV-2 by FDA under an Emergency Use Authorization (EUA). This EUA will remain in effect (meaning this test can be used) for the duration of the COVID-19 declaration under Section 564(b)(1) of the Act, 21 U.S.C. section 360bbb-3(b)(1), unless the authorization is terminated or revoked.  Performed at [facility], [place] [address], [place], [place] [address]   Respiratory (~20 pathogens) panel by PCR     Status: None   Collection Time: 03/10/21  3:11 PM   Specimen: Nasopharyngeal Swab; Respiratory  Result Value Ref Range Status   Adenovirus NOT DETECTED NOT DETECTED Final   Coronavirus 229E NOT DETECTED NOT DETECTED Final    Comment: (NOTE) The Coronavirus on the Respiratory Panel, DOES NOT test for the novel  Coronavirus (2019 nCoV)    Coronavirus HKU1 NOT DETECTED NOT DETECTED Final   Coronavirus NL63 NOT DETECTED NOT DETECTED Final   Coronavirus OC43 NOT DETECTED NOT  DETECTED Final   Metapneumovirus NOT DETECTED NOT DETECTED Final   Rhinovirus / Enterovirus NOT DETECTED NOT DETECTED Final   Influenza A NOT DETECTED NOT DETECTED Final   Influenza B NOT DETECTED NOT DETECTED Final   Parainfluenza Virus 1 NOT DETECTED NOT DETECTED Final   Parainfluenza Virus 2 NOT DETECTED NOT DETECTED Final   Parainfluenza Virus 3 NOT DETECTED NOT DETECTED Final   Parainfluenza Virus 4 NOT DETECTED NOT DETECTED Final   Respiratory Syncytial Virus NOT DETECTED NOT DETECTED Final   Bordetella pertussis NOT DETECTED NOT DETECTED Final   Bordetella Parapertussis NOT DETECTED NOT DETECTED Final   Chlamydophila pneumoniae NOT DETECTED NOT DETECTED Final   Mycoplasma pneumoniae NOT DETECTED NOT DETECTED Final    Comment: Performed at [facility], [place],  [place], [place] [address]     Radiology Studies: CT CHEST WO CONTRAST  Result Date: 03/14/2021 CLINICAL DATA:  Worsening shortness of breath. EXAM: CT CHEST WITHOUT CONTRAST TECHNIQUE: Multidetector CT imaging of the chest was performed following the standard protocol without IV contrast. RADIATION DOSE REDUCTION: This exam was performed according to the departmental dose-optimization program which includes automated exposure control, adjustment of the mA and/or kV according to patient size and/or use of iterative reconstruction technique. COMPARISON:  CT chest dated February 11, 2021. FINDINGS: Cardiovascular: No significant vascular findings. Normal heart size. No pericardial effusion. Unchanged dense mitral annular calcification. No thoracic aortic aneurysm. Coronary, aortic arch, and branch vessel atherosclerotic vascular disease. Mediastinum/Nodes: No enlarged mediastinal or axillary lymph nodes. Thyroid gland and esophagus demonstrate no significant findings. Lungs/Pleura: Small amount of secretions in the trachea. Small right pleural effusion, increased in size since December. Unchanged trace left pleural  effusion. Similar peripheral post radiation change in the right upper lobe. New small nodules and ill-defined ground-glass densities throughout both lungs with more consolidative areas in the right middle and lower lobes. Chronic dependent atelectasis in the posterior right greater than left lower lobes. Mild smooth interlobular septal thickening. No pneumothorax. Upper Abdomen: No acute abnormality. Musculoskeletal: No acute or significant osseous findings. IMPRESSION: 1. New small nodules and ill-defined ground-glass densities throughout both lungs with more consolidative areas in the right middle and lower lobes, concerning for multifocal pneumonia. 2. Small right pleural effusion, increased in size since December. Unchanged trace left pleural effusion. New mild interstitial pulmonary edema. 3. Aortic Atherosclerosis (ICD10-I70.0). Electronically Signed   By: Titus Dubin M.D.   On: 03/14/2021 16:01    Scheduled Meds:  amLODipine  2.5 mg Oral Daily   apixaban  2.5 mg Oral BID   cholecalciferol  1,000 Units Oral Daily   cloNIDine  0.2 mg Oral BID   diltiazem  60 mg Oral Q8H   feeding supplement (GLUCERNA SHAKE)  237 mL Oral TID BM   guaiFENesin  600 mg Oral BID   hydrALAZINE  50 mg Oral TID   insulin aspart  0-5 Units Subcutaneous QHS   insulin aspart  0-9 Units Subcutaneous TID WC   isosorbide mononitrate  30 mg Oral Daily   mouth rinse  15 mL Mouth Rinse BID   mometasone-formoterol  2 puff Inhalation BID   senna-docusate  1 tablet Oral BID   Continuous Infusions:   LOS: 5 days   Marylu Lund, MD [facility] Pager On Amion  If 7PM-7AM, please contact night-coverage 03/15/2021, 10:09 AM

## 2021-03-15 NOTE — Progress Notes (Signed)
Discharge instructions reviewed with patient and caregiver, awaiting PTAR for transport home.

## 2021-03-16 ENCOUNTER — Telehealth: Payer: Self-pay

## 2021-03-16 NOTE — Telephone Encounter (Signed)
Spoke with patient's caregiver Larey Seat and scheduled an in-person Palliative Consult for 03/22/21 @ 11:30AM.  COVID screening was negative. No pets in home. Patient lives alone but has caregivers.   Consent obtained; updated Outlook/Netsmart/Team List and Epic.   CG is aware she may be receiving a call from provider the day before or day of to confirm appointment.

## 2021-03-19 ENCOUNTER — Telehealth: Payer: Self-pay

## 2021-03-19 NOTE — Telephone Encounter (Signed)
ILR alert report received. Battery status shows RRT on 03/10/2021, sent to triage. Normal device function. No new symptom, tachy, brady, or pause episodes. Known PAF, on [place]. Monthly summary reports and ROV/PRN  Successful telephone encounter to patient and caregiver via speaker phone. Discussed ILR at RRT. Discussed options for removal vs leaving intact. Patient evolved with palliative care and chooses to leave implanted at this time. Address confimed. Caregiver instructed to unplug remote monitor and return once kit received. Future remote monitoring appointments cancelled. Patient marked inactive in paceart and discontinued in carelink. Caregiver appreciative of call and follow up.

## 2021-03-20 ENCOUNTER — Other Ambulatory Visit (HOSPITAL_COMMUNITY): Payer: Self-pay

## 2021-03-22 ENCOUNTER — Encounter: Payer: Self-pay | Admitting: Oncology

## 2021-03-22 ENCOUNTER — Encounter: Payer: Self-pay | Admitting: Family Medicine

## 2021-03-22 ENCOUNTER — Other Ambulatory Visit: Payer: Self-pay

## 2021-03-22 ENCOUNTER — Other Ambulatory Visit: Payer: Medicare Other | Admitting: Family Medicine

## 2021-03-22 VITALS — BP 148/46 | HR 81 | Resp 18

## 2021-03-22 DIAGNOSIS — Z515 Encounter for palliative care: Secondary | ICD-10-CM

## 2021-03-22 DIAGNOSIS — Z9189 Other specified personal risk factors, not elsewhere classified: Secondary | ICD-10-CM

## 2021-03-22 DIAGNOSIS — R338 Other retention of urine: Secondary | ICD-10-CM

## 2021-03-22 DIAGNOSIS — N184 Chronic kidney disease, stage 4 (severe): Secondary | ICD-10-CM

## 2021-03-22 DIAGNOSIS — K5900 Constipation, unspecified: Secondary | ICD-10-CM

## 2021-03-22 DIAGNOSIS — R5381 Other malaise: Secondary | ICD-10-CM

## 2021-03-22 NOTE — Progress Notes (Signed)
[facility] Palliative Care Consult Note Telephone: [phone]  Fax: [phone]   Date of encounter: 03/22/21 11:30 AM PATIENT NAME: Sabrina [name] [address] [id number]   [phone] (home)  DOB: [birth year] MRN: [id number] PRIMARY CARE PROVIDER:    [name], MD,  [name] [place] [address] [phone]  REFERRING PROVIDER:   [name], MD [address] [place],  [place] [address] [phone]  RESPONSIBLE PARTY:    Contact Information     Name Relation Home Work Mobile   [place] [phone]  [phone]   Eloisa Northern Niece   [phone]        I met face to face with patient, caregivers and nieces in her home, nephew was present by phone. Palliative Care was asked to follow this patient by consultation request of  [name], MD to address advance care planning and complex medical decision making. This is the initial visit.          ASSESSMENT, SYMPTOM MANAGEMENT AND PLAN / RECOMMENDATIONS:   Acute urinary retention with foley catheter in place and risk for infection-Will obtain urine sample from port in am for UA and urine culture, treat if indicated for infection.         Advised family if they were not wanting to do cystoscopy, would have PT        Start pt ambulating before attempting trial of voiding with catheter removal       And avoid urology consult.  Aggressive management of constipation.         HH  RN for catheter care.  2.   Constipation- Miralax 17 gm daily in 8 ounces of fluid with goal of soft       Formed BM at least every other day.          3.   Physical deconditioning-HH PT and OT with [facility].  4.   CKD stage IV- Cr 2.33, BUN 103, eGFR 19.  Family not interested in           Dialysis  5.   Palliative Encounter-discuss with patient and family at next visit goals of        Care when pt can hopefully be an active participant.   Educated on          signs/symptoms of heart failure to report. HX of DVT on Eliquis.   Follow up Palliative Care Visit: Palliative care will continue to follow for complex medical decision making, advance care planning, and clarification of goals. Return 4 weeks or prn.    This visit was coded based on medical decision making (MDM).  PPS: 40%  HOSPICE ELIGIBILITY/DIAGNOSIS: TBD  Chief Complaint:  [facility] received a referral to follow up with patient and family on medical management and goals of care.  Pt had recent hospitalization and was sent home with a foley catheter.  HISTORY OF PRESENT ILLNESS:  Sabrina [name] is a 88 y.o. female with recent hospitalization for difficulty breathing, was treated for COPD exacerbation with resulting Afib RVR and CHF exacerbation. She was noted to have urinary retention and had a foley placed.  Pt had DNR in place.  She has 5 nieces and one nephew who are her caregivers.  She has paid caregivers in the home who care for her.  Pt has a hx of CKD stage IV and Diabetes with caregivers indicating fasting blood sugars in 180s. Family is  requesting help with getting pt to Urologist. Caregivers and family state that pt is receiving nursing care from [facility] and that she is supposed to have both PT and OT soon. She does have a bedside commode and was walking to the bathroom prior to hospitalization to the bedside commode. Niece states that pt has difficulty voiding if not sitting on the commode but given recent illness she is having trouble walking.  Previously pt had signed a MOST form but this was completed by the patient with a home visiting provider from [facility] who did not include the family in this discussion and when she was recently taken to the ER pt indicated wanting to be in ICU and treated with advanced airway support as she was on BiPap for 36 hours and did not fight it.  States she has a prior hx of illness requiring ventilator support  since the MOST was completed.  Pt does have DNR.  Niece Eloisa Northern and her brother (nephew) Rhea Belton want to see if pt can improve with therapy.  She has been able to interact with family.  History obtained from review of EMR, discussion with primary team, and interview with family, facility staff/caregiver and Ms. Ortloff.  I reviewed available labs, medications, imaging, studies and related documents from the EMR.  Records reviewed and summarized above.   ROS-information obtained primarily from caregivers General: NAD, denies pain ENMT: deny dysphagia Cardiovascular: denies pain Pulmonary: deny cough or SOB Abdomen: endorse good appetite and constipation, endorse continence of bowel GU: endorse recent urinary retention with foley in place MSK:  endorse increased weakness, no falls reported Skin: deny rashes or wounds Neurological: denies pain, denies insomnia Psych: Endorse positive mood Heme/lymph/immuno: deny bruises or abnormal bleeding  Physical Exam: Current weight: 165 lbs 9.1 ounces as of 03/14/21 Constitutional: NAD General: frail appearing, WD  EYES: anicteric sclera, lids intact, no discharge  ENMT: intact hearing, oral mucous membranes moist, dentition intact CV: S1S2, RRR, rate controlled with occasional premature beat, LUSB squeeking SM, no LE edema Pulmonary: CTAB, no increased work of breathing, no cough, room air Abdomen: normo-active BS + 4 quadrants, soft and non tender, no ascites GU: foley cath in place draining cloudy, mucousy yellow-creamy urine MSK: noted sarcopenia BLE, moves all extremities, currently in hospital bed Skin: warm and dry, no rashes or wounds on visible skin Neuro:  noted generalized weakness, answers yes or no to questions when asked directly, knows her family and caregivers Psych: non-anxious affect, drowsy, arousable Hem/lymph/immuno: no widespread bruising  CURRENT PROBLEM LIST:  Patient Active Problem List   Diagnosis Date Noted    Hypertensive urgency    Demand ischemia ([place])    COPD exacerbation ([place]) 03/10/2021   Atrial fibrillation with RVR ([place]) 03/10/2021   Anemia 02/08/2021   Symptomatic anemia 02/07/2021   Nonspecific abnormal electrocardiogram (ECG) (EKG)    Dyspnea 12/18/2020   Abnormal gait 05/18/2020   Body mass index (BMI) 35.0-35.9, adult 05/18/2020   Cardiac pacemaker in situ 05/18/2020   Diabetic nephropathy ([place]) 05/18/2020   IgM monoclonal gammopathy of uncertain significance 05/18/2020   Malignant hypertensive chronic kidney disease 05/18/2020   Morbid obesity ([place]) 05/18/2020   Thrombophilia ([place]) 05/18/2020   Secondary hypercoagulable state ([place]) 08/16/2019   Osteoporosis 05/04/2019   Hypercalcemia 11/04/2018   History of stroke 11/04/2018   Paroxysmal atrial fibrillation (HCC) 11/04/2018   Acute kidney injury ([place])    Acute metabolic encephalopathy 25/07/3974   Personal history of DVT and pulm embolus 10/26/2018  Personal history of other venous thrombosis and embolism 10/26/2018   UTI (urinary tract infection) 10/24/2018   Clavicle fracture 09/26/2018   Degeneration of lumbar intervertebral disc 10/01/2017   Blurry vision, bilateral    Acute blood loss anemia    History of breast cancer 05/30/2017   Physical debility 05/30/2017   Occipital infarction ([place]) 05/30/2017   Pressure injury of skin 05/28/2017   Fall at home 05/27/2017   CKD (chronic kidney disease), stage IV ([place]) 05/27/2017   High cholesterol 05/27/2017   Type 2 diabetes mellitus with stage 4 chronic kidney disease ([place]) 05/27/2017   Hypertension associated with diabetes ([place]) 05/27/2017   Gout 05/27/2017   Elevated troponin 05/27/2017   Lumbar radiculopathy 05/15/2017   Pain of right calf 04/30/2017   Asymmetrical left sensorineural hearing loss 02/14/2016   Impacted cerumen of left ear 02/14/2016   Lipoma of lower extremity 09/12/2014   Abnormal x-ray 03/15/2014   Chronic diastolic congestive heart failure  ([place]) 02/23/2014   Candidiasis of skin 01/13/2014   Osteopenia 11/30/2013   Hypoglycemia 09/13/2013   Acute respiratory failure with hypoxia ([place]) 06/14/2013   History of pulmonary embolism 06/10/2013   Nausea and vomiting 06/10/2013   Accelerated hypertension 06/10/2013   Candidiasis of vagina 05/19/2013   Aortic stenosis 04/22/2013   Edema leg 04/22/2013   Breast cancer of upper-outer quadrant of right female breast ([place]); in remission 03/19/2013   PAST MEDICAL HISTORY:  Active Ambulatory Problems    Diagnosis Date Noted   Breast cancer of upper-outer quadrant of right female breast ([place]); in remission 03/19/2013   Aortic stenosis 04/22/2013   Edema leg 04/22/2013   History of pulmonary embolism 06/10/2013   Nausea and vomiting 06/10/2013   Accelerated hypertension 06/10/2013   Acute respiratory failure with hypoxia ([place]) 06/14/2013   Hypoglycemia 09/13/2013   Osteopenia 11/30/2013   Chronic diastolic congestive heart failure ([place]) 02/23/2014   Abnormal x-ray 03/15/2014   Lipoma of lower extremity 09/12/2014   Fall at home 05/27/2017   CKD (chronic kidney disease), stage IV ([place]) 05/27/2017   High cholesterol 05/27/2017   Type 2 diabetes mellitus with stage 4 chronic kidney disease ([place]) 05/27/2017   Hypertension associated with diabetes ([place]) 05/27/2017   Gout 05/27/2017   Elevated troponin 05/27/2017   Pressure injury of skin 05/28/2017   History of breast cancer 05/30/2017   Physical debility 05/30/2017   Occipital infarction ([place]) 05/30/2017   Blurry vision, bilateral    Acute blood loss anemia    Clavicle fracture 09/26/2018   UTI (urinary tract infection) 62/94/7654   Acute metabolic encephalopathy 65/05/5463   Personal history of DVT and pulm embolus 10/26/2018   Acute kidney injury ([place])    Hypercalcemia 11/04/2018   History of stroke 11/04/2018   Paroxysmal atrial fibrillation ([place]) 11/04/2018   Asymmetrical left sensorineural hearing loss 02/14/2016    Candidiasis of skin 01/13/2014   Candidiasis of vagina 05/19/2013   Degeneration of lumbar intervertebral disc 10/01/2017   Impacted cerumen of left ear 02/14/2016   Lumbar radiculopathy 05/15/2017   Pain of right calf 04/30/2017   Personal history of other venous thrombosis and embolism 10/26/2018   Osteoporosis 05/04/2019   Secondary hypercoagulable state ([place]) 08/16/2019   Abnormal gait 05/18/2020   Body mass index (BMI) 35.0-35.9, adult 05/18/2020   Cardiac pacemaker in situ 05/18/2020   Diabetic nephropathy ([place]) 05/18/2020   IgM monoclonal gammopathy of uncertain significance 05/18/2020   Malignant hypertensive chronic kidney disease 05/18/2020   Morbid obesity ([place]) 05/18/2020  Thrombophilia ([place]) 05/18/2020   Dyspnea 12/18/2020   Nonspecific abnormal electrocardiogram (ECG) (EKG)    Symptomatic anemia 02/07/2021   Anemia 02/08/2021   COPD exacerbation ([place]) 03/10/2021   Atrial fibrillation with RVR ([place]) 03/10/2021   Hypertensive urgency    Demand ischemia [facility])    Resolved Ambulatory Problems    Diagnosis Date Noted   Breast cancer ([place]) 03/30/2013   Breast cancer ([place]) 04/15/2013   Stage IV breast cancer in female [facility])    Past Medical History:  Diagnosis Date   Arthritis    Basal cell carcinoma (BCC) of face 1983   Breast cancer, right breast ([place]) 03/2013   Chronic kidney disease (CKD), stage III (moderate) (HCC)    Dental crowns present    DVT (deep venous thrombosis) ([place]) ~ 06/2013   Heart murmur    Hypertension    Immature cataract    Non-insulin dependent type 2 diabetes mellitus (HCC)    Personal history of chemotherapy    Personal history of radiation therapy    Pulmonary embolism ([place]) ~ 06/2013   Radiation 09/06/13-10/20/13   Stroke ([place]) 05/2017   Wears partial dentures    SOCIAL HX:  Social History   Tobacco Use   Smoking status: Never   Smokeless tobacco: Never   Tobacco comments:    only smoked 2 packs cigarettes total; husband quit in  1971  Substance Use Topics   Alcohol use: Not Currently    Alcohol/week: 3.0 standard drinks    Types: 3 Glasses of wine per week   FAMILY HX:  Family History  Problem Relation Age of Onset   Pneumonia Mother    Heart attack Father    Breast cancer Other 38       niece   Breast cancer Other 35       niece   Ovarian cancer Other        niece       Preferred Pharmacy: ALLERGIES:  Allergies  Allergen Reactions   Beta Adrenergic Blockers Other (See Comments)    Fatigue     PERTINENT MEDICATIONS:  Outpatient Encounter Medications as of 03/22/2021  Medication Sig   acetaminophen (TYLENOL) 500 MG tablet Take 500 mg by mouth daily as needed for fever or moderate pain (for foot pain).   albuterol (VENTOLIN HFA) 108 (90 Base) MCG/ACT inhaler Inhale 1-2 puffs into the lungs every 6 (six) hours as needed for wheezing or shortness of breath.   allopurinol (ZYLOPRIM) 100 MG tablet Take 100 mg by mouth daily.   amLODipine (NORVASC) 2.5 MG tablet Take 2.5 mg by mouth daily.   benzonatate (TESSALON) 100 MG capsule Take 1 capsule (100 mg total) by mouth every 8 (eight) hours.   cholecalciferol (VITAMIN D) 1000 UNITS tablet Take 1,000 Units by mouth daily.   cloNIDine (CATAPRES) 0.2 MG tablet Take 0.2 mg by mouth 2 (two) times daily.   diltiazem (CARDIZEM) 60 MG tablet Take 1 tablet (60 mg total) by mouth every 8 (eight) hours.   ELIQUIS 2.5 MG TABS tablet TAKE 1 TABLET BY MOUTH TWICE DAILY. (Patient taking differently: Take 2.5 mg by mouth 2 (two) times daily.)   famotidine (PEPCID) 20 MG tablet Take 20 mg by mouth at bedtime as needed for indigestion or heartburn.   furosemide (LASIX) 40 MG tablet Take 40 mg by mouth daily. Per Dr. If over 166lb take 76m a day   gabapentin (NEURONTIN) 100 MG capsule Take 200-300 mg by mouth See admin instructions. 2 capsules bid  and 3 capsules at bedtime   glipiZIDE (GLUCOTROL) 5 MG tablet Take 2.5-5 mg by mouth See admin instructions. Takes 1 tab every am  and 1/2 tab every evening   hydrALAZINE (APRESOLINE) 50 MG tablet Take 50 mg by mouth 3 (three) times daily.   ipratropium-albuterol (DUONEB) 0.5-2.5 (3) MG/3ML SOLN Take 3 mLs by nebulization every 6 (six) hours as needed (wheezing and shortness of breath).   isosorbide mononitrate (IMDUR) 30 MG 24 hr tablet Take 1 tablet (30 mg total) by mouth daily.   levalbuterol (XOPENEX) 0.63 MG/3ML nebulizer solution Take 0.63 mg by nebulization every 4 (four) hours as needed for wheezing or shortness of breath.   mometasone-formoterol (DULERA) 200-5 MCG/ACT AERO Inhale 2 puffs into the lungs 2 (two) times daily.   ondansetron (ZOFRAN-ODT) 8 MG disintegrating tablet Take 8 mg by mouth 2 (two) times daily as needed for nausea or vomiting.   senna-docusate (SENOKOT-S) 8.6-50 MG tablet Take 1 tablet by mouth at bedtime as needed for mild constipation.   TRUE METRIX BLOOD GLUCOSE TEST test strip    TRUEplus Lancets 28G MISC Apply topically.   No facility-administered encounter medications on file as of 03/22/2021.     ---------------------------------------------------------------------------------------------------------------------------------------------------------------------------------------------------------------- Advance Care Planning/Goals of Care: Goals include to maximize quality of life and symptom management. Health care surrogate gave his and her permission to discuss.Our advance care planning conversation included a discussion about:    The value and importance of advance care planning  Exploration of personal, cultural or spiritual beliefs that might influence medical decisions  Exploration of goals of care in the event of a sudden injury or illness  Identification  of a healthcare agent -nephew Rhea Belton and niece Eloisa Northern Review and updating or creation of an advance directive document . Decision not to resuscitate or to de-escalate disease focused treatments due to poor  prognosis.  CODE STATUS: DNR  I spent 10 minutes providing this consultation providing Palliative Care counseling on goals of care.   Thank you for the opportunity to participate in the care of Ms. Guzy.  The palliative care team will continue to follow. Please call our office at [phone] if we can be of additional assistance.   Marijo Conception, FNP-C  COVID-19 PATIENT SCREENING TOOL Asked and negative response unless otherwise noted:  Have you had symptoms of covid, tested positive or been in contact with someone with symptoms/positive test in the past 5-10 days?

## 2021-03-23 ENCOUNTER — Telehealth: Payer: Self-pay

## 2021-03-23 NOTE — Telephone Encounter (Signed)
Phone call placed to [facility] to request any preliminary results on urine specimen that was dropped off this morning. Spoke with technician who informed this RN that they have no results on urine specimens, even for UA, as they are sent off and not tested on site.

## 2021-03-24 ENCOUNTER — Telehealth: Payer: Self-pay | Admitting: Family Medicine

## 2021-03-24 DIAGNOSIS — T83511A Infection and inflammatory reaction due to indwelling urethral catheter, initial encounter: Secondary | ICD-10-CM

## 2021-03-24 DIAGNOSIS — N184 Chronic kidney disease, stage 4 (severe): Secondary | ICD-10-CM

## 2021-03-24 DIAGNOSIS — N390 Urinary tract infection, site not specified: Secondary | ICD-10-CM

## 2021-03-24 NOTE — Telephone Encounter (Signed)
° ° °  [facility] Palliative Care Consult Note Telephone: [phone]  Fax: [phone]    Date of encounter: 03/24/21 2:40 PM  PATIENT NAME: Sabrina [name] [id number]   [phone] (home)  DOB: 10-29-1926 MRN: [id number] PRIMARY CARE PROVIDER:    [name], MD,  [place] [place]  [address] [phone]  REFERRING PROVIDER:   No referring provider defined for this encounter. N/A  RESPONSIBLE PARTY:    Contact Information     Name Relation Home Work Mobile   [place] [phone]  [phone]   Eloisa Northern Niece   [phone]      ASSESSMENT AND PLAN / RECOMMENDATIONS:  Likely CAUTI in setting of CKD stage IV-treat with Cipro 500 mg daily x 5 days.   HISTORY OF PRESENT ILLNESS:  Sabrina [name] is a 88 y.o. year old female with recent hx of urinary retention and foley catheter placement sent home to follow up outpatient with Urology. Patient has urine output in foley that is cloudy, has milky mucous present and is more fatigued recently.  She has several year hx of elevated creatinine in the 2-2.5 range with eGFR in upper teens/low 20s.  Obtained specimen from foley access port to send for UA and urine culture.  She has a hx of + urine culture for Pseudomonas aeruginosa.  [facility] indicates that even UA is a send out test and will not be resulted for 4-5 days.  TCT Eloisa Northern, niece of pt and advised of same, will treat while results pending.      Thank you for the opportunity to participate in the care of Ms. Harter.  The palliative care team will continue to follow. Please call our office at [phone] if we can be of additional assistance.   Marijo Conception, FNP -C

## 2021-03-29 ENCOUNTER — Other Ambulatory Visit: Payer: Self-pay

## 2021-03-29 ENCOUNTER — Inpatient Hospital Stay (HOSPITAL_COMMUNITY)
Admission: EM | Admit: 2021-03-29 | Discharge: 2021-04-02 | DRG: 193 | Disposition: A | Discharge status: Home / Self Care | Payer: Medicare Other | Attending: Internal Medicine | Admitting: Internal Medicine

## 2021-03-29 ENCOUNTER — Emergency Department (HOSPITAL_COMMUNITY): Payer: Medicare Other

## 2021-03-29 DIAGNOSIS — R0689 Other abnormalities of breathing: Secondary | ICD-10-CM | POA: Diagnosis not present

## 2021-03-29 DIAGNOSIS — Z888 Allergy status to other drugs, medicaments and biological substances status: Secondary | ICD-10-CM

## 2021-03-29 DIAGNOSIS — Z803 Family history of malignant neoplasm of breast: Secondary | ICD-10-CM

## 2021-03-29 DIAGNOSIS — Z66 Do not resuscitate: Secondary | ICD-10-CM | POA: Diagnosis not present

## 2021-03-29 DIAGNOSIS — Z9981 Dependence on supplemental oxygen: Secondary | ICD-10-CM

## 2021-03-29 DIAGNOSIS — I130 Hypertensive heart and chronic kidney disease with heart failure and stage 1 through stage 4 chronic kidney disease, or unspecified chronic kidney disease: Secondary | ICD-10-CM | POA: Diagnosis present

## 2021-03-29 DIAGNOSIS — D649 Anemia, unspecified: Secondary | ICD-10-CM | POA: Diagnosis not present

## 2021-03-29 DIAGNOSIS — Z8673 Personal history of transient ischemic attack (TIA), and cerebral infarction without residual deficits: Secondary | ICD-10-CM

## 2021-03-29 DIAGNOSIS — Z79899 Other long term (current) drug therapy: Secondary | ICD-10-CM | POA: Diagnosis not present

## 2021-03-29 DIAGNOSIS — Z7951 Long term (current) use of inhaled steroids: Secondary | ICD-10-CM | POA: Diagnosis not present

## 2021-03-29 DIAGNOSIS — Z85828 Personal history of other malignant neoplasm of skin: Secondary | ICD-10-CM

## 2021-03-29 DIAGNOSIS — I700 Atherosclerosis of aorta: Secondary | ICD-10-CM | POA: Diagnosis not present

## 2021-03-29 DIAGNOSIS — Z20822 Contact with and (suspected) exposure to covid-19: Secondary | ICD-10-CM | POA: Diagnosis present

## 2021-03-29 DIAGNOSIS — Z86718 Personal history of other venous thrombosis and embolism: Secondary | ICD-10-CM

## 2021-03-29 DIAGNOSIS — R0600 Dyspnea, unspecified: Secondary | ICD-10-CM | POA: Diagnosis not present

## 2021-03-29 DIAGNOSIS — J449 Chronic obstructive pulmonary disease, unspecified: Secondary | ICD-10-CM | POA: Diagnosis not present

## 2021-03-29 DIAGNOSIS — E1165 Type 2 diabetes mellitus with hyperglycemia: Secondary | ICD-10-CM | POA: Diagnosis not present

## 2021-03-29 DIAGNOSIS — J9621 Acute and chronic respiratory failure with hypoxia: Secondary | ICD-10-CM | POA: Diagnosis not present

## 2021-03-29 DIAGNOSIS — Z7901 Long term (current) use of anticoagulants: Secondary | ICD-10-CM | POA: Diagnosis not present

## 2021-03-29 DIAGNOSIS — E7800 Pure hypercholesterolemia, unspecified: Secondary | ICD-10-CM | POA: Diagnosis not present

## 2021-03-29 DIAGNOSIS — N184 Chronic kidney disease, stage 4 (severe): Secondary | ICD-10-CM | POA: Diagnosis present

## 2021-03-29 DIAGNOSIS — Z86711 Personal history of pulmonary embolism: Secondary | ICD-10-CM

## 2021-03-29 DIAGNOSIS — N179 Acute kidney failure, unspecified: Secondary | ICD-10-CM | POA: Diagnosis present

## 2021-03-29 DIAGNOSIS — E1122 Type 2 diabetes mellitus with diabetic chronic kidney disease: Secondary | ICD-10-CM | POA: Diagnosis present

## 2021-03-29 DIAGNOSIS — L89152 Pressure ulcer of sacral region, stage 2: Secondary | ICD-10-CM | POA: Diagnosis not present

## 2021-03-29 DIAGNOSIS — Z950 Presence of cardiac pacemaker: Secondary | ICD-10-CM | POA: Diagnosis not present

## 2021-03-29 DIAGNOSIS — D631 Anemia in chronic kidney disease: Secondary | ICD-10-CM | POA: Diagnosis not present

## 2021-03-29 DIAGNOSIS — Z9221 Personal history of antineoplastic chemotherapy: Secondary | ICD-10-CM

## 2021-03-29 DIAGNOSIS — I5032 Chronic diastolic (congestive) heart failure: Secondary | ICD-10-CM | POA: Diagnosis present

## 2021-03-29 DIAGNOSIS — J189 Pneumonia, unspecified organism: Principal | ICD-10-CM

## 2021-03-29 DIAGNOSIS — Z853 Personal history of malignant neoplasm of breast: Secondary | ICD-10-CM

## 2021-03-29 DIAGNOSIS — M109 Gout, unspecified: Secondary | ICD-10-CM | POA: Diagnosis not present

## 2021-03-29 DIAGNOSIS — J440 Chronic obstructive pulmonary disease with acute lower respiratory infection: Secondary | ICD-10-CM | POA: Diagnosis not present

## 2021-03-29 DIAGNOSIS — R0602 Shortness of breath: Secondary | ICD-10-CM | POA: Diagnosis not present

## 2021-03-29 DIAGNOSIS — R739 Hyperglycemia, unspecified: Secondary | ICD-10-CM

## 2021-03-29 DIAGNOSIS — J90 Pleural effusion, not elsewhere classified: Secondary | ICD-10-CM | POA: Diagnosis not present

## 2021-03-29 DIAGNOSIS — I480 Paroxysmal atrial fibrillation: Secondary | ICD-10-CM | POA: Diagnosis not present

## 2021-03-29 DIAGNOSIS — Z7984 Long term (current) use of oral hypoglycemic drugs: Secondary | ICD-10-CM | POA: Diagnosis not present

## 2021-03-29 DIAGNOSIS — Z6832 Body mass index (BMI) 32.0-32.9, adult: Secondary | ICD-10-CM

## 2021-03-29 DIAGNOSIS — E669 Obesity, unspecified: Secondary | ICD-10-CM | POA: Diagnosis present

## 2021-03-29 DIAGNOSIS — I959 Hypotension, unspecified: Secondary | ICD-10-CM | POA: Diagnosis not present

## 2021-03-29 DIAGNOSIS — Z923 Personal history of irradiation: Secondary | ICD-10-CM

## 2021-03-29 DIAGNOSIS — Y95 Nosocomial condition: Secondary | ICD-10-CM | POA: Diagnosis present

## 2021-03-29 LAB — CBC WITH DIFFERENTIAL/PLATELET
Abs Immature Granulocytes: 0.04 10*3/uL (ref 0.00–0.07)
Basophils Absolute: 0 10*3/uL (ref 0.0–0.1)
Basophils Relative: 0 %
Eosinophils Absolute: 0.4 10*3/uL (ref 0.0–0.5)
Eosinophils Relative: 3 %
HCT: 23.6 % — ABNORMAL LOW (ref 36.0–46.0)
Hemoglobin: 7.8 g/dL — ABNORMAL LOW (ref 12.0–15.0)
Immature Granulocytes: 0 %
Lymphocytes Relative: 22 %
Lymphs Abs: 2.4 10*3/uL (ref 0.7–4.0)
MCH: 32.2 pg (ref 26.0–34.0)
MCHC: 33.1 g/dL (ref 30.0–36.0)
MCV: 97.5 fL (ref 80.0–100.0)
Monocytes Absolute: 0.7 10*3/uL (ref 0.1–1.0)
Monocytes Relative: 6 %
Neutro Abs: 7.6 10*3/uL (ref 1.7–7.7)
Neutrophils Relative %: 69 %
Platelets: 239 10*3/uL (ref 150–400)
RBC: 2.42 MIL/uL — ABNORMAL LOW (ref 3.87–5.11)
RDW: 14.9 % (ref 11.5–15.5)
WBC: 11.1 10*3/uL — ABNORMAL HIGH (ref 4.0–10.5)
nRBC: 0 % (ref 0.0–0.2)

## 2021-03-29 LAB — COMPREHENSIVE METABOLIC PANEL
ALT: 16 U/L (ref 0–44)
AST: 15 U/L (ref 15–41)
Albumin: 3.2 g/dL — ABNORMAL LOW (ref 3.5–5.0)
Alkaline Phosphatase: 80 U/L (ref 38–126)
Anion gap: 11 (ref 5–15)
BUN: 87 mg/dL — ABNORMAL HIGH (ref 8–23)
CO2: 27 mmol/L (ref 22–32)
Calcium: 10.7 mg/dL — ABNORMAL HIGH (ref 8.9–10.3)
Chloride: 90 mmol/L — ABNORMAL LOW (ref 98–111)
Creatinine, Ser: 2.14 mg/dL — ABNORMAL HIGH (ref 0.44–1.00)
GFR, Estimated: 21 mL/min — ABNORMAL LOW (ref >60)
Glucose, Bld: 313 mg/dL — ABNORMAL HIGH (ref 70–99)
Potassium: 3.8 mmol/L (ref 3.5–5.1)
Sodium: 128 mmol/L — ABNORMAL LOW (ref 135–145)
Total Bilirubin: 0.7 mg/dL (ref 0.3–1.2)
Total Protein: 6.6 g/dL (ref 6.5–8.1)

## 2021-03-29 LAB — RESP PANEL BY RT-PCR (FLU A&B, COVID) ARPGX2
Influenza A by PCR: NEGATIVE
Influenza B by PCR: NEGATIVE
SARS Coronavirus 2 by RT PCR: NEGATIVE

## 2021-03-29 LAB — BRAIN NATRIURETIC PEPTIDE: B Natriuretic Peptide: 247.9 pg/mL — ABNORMAL HIGH (ref 0.0–100.0)

## 2021-03-29 LAB — CBG MONITORING, ED: Glucose-Capillary: 261 mg/dL — ABNORMAL HIGH (ref 70–99)

## 2021-03-29 LAB — LACTIC ACID, PLASMA: Lactic Acid, Venous: 0.7 mmol/L (ref 0.5–1.9)

## 2021-03-29 MED ORDER — VANCOMYCIN HCL IN DEXTROSE 1-5 GM/200ML-% IV SOLN
1000.0000 mg | Freq: Once | INTRAVENOUS | Status: AC
  Administered 2021-03-29: 1000 mg via INTRAVENOUS
  Filled 2021-03-29: qty 200

## 2021-03-29 MED ORDER — INSULIN ASPART 100 UNIT/ML IJ SOLN
10.0000 [IU] | Freq: Once | INTRAMUSCULAR | Status: DC
  Filled 2021-03-29: qty 0.1

## 2021-03-29 MED ORDER — INSULIN ASPART 100 UNIT/ML IJ SOLN
5.0000 [IU] | Freq: Once | INTRAMUSCULAR | Status: AC
  Administered 2021-03-29: 5 [IU] via SUBCUTANEOUS
  Filled 2021-03-29: qty 0.05

## 2021-03-29 MED ORDER — HYDRALAZINE HCL 25 MG PO TABS
50.0000 mg | ORAL_TABLET | Freq: Once | ORAL | Status: AC
  Administered 2021-03-29: 50 mg via ORAL
  Filled 2021-03-29: qty 2

## 2021-03-29 MED ORDER — SODIUM CHLORIDE 0.9 % IV SOLN
2.0000 g | Freq: Once | INTRAVENOUS | Status: AC
  Administered 2021-03-29: 2 g via INTRAVENOUS
  Filled 2021-03-29: qty 2

## 2021-03-29 NOTE — ED Provider Notes (Signed)
Patient is a DNR.  Patient with recent admission on January 6 for COPD exacerbation.  Patient is taken care of at home by a care giver home health aide.  Patient was brought in by EMS appeared to be more short of breath than usual.  Patient aide reports the patient is recovering from pneumonia from 2 weeks ago.  Per EMS patient is on O2 periodically at home the amount is unknown.  Patient felt warm to the touch but the temperature reading was 98.0.  Patient is a DNR.  Patient afebrile here.  Patient's chest x-ray raises concerns for right basilar atelectasis or airspace disease.  There is a small right pleural effusion that is stable.  Patient currently fairly tachycardic on 3 L of oxygen oxygen sats are fine on that.  And patient apparently does have oxygen available at home.  Does have a little bit of a leukocytosis.  Blood sugar was in the 300 range.  Sodium a little low at 128.  BNP in the 200 range but actually improved for her.  No wheezing here.  Patient looking somewhat ill.  Would recommend treating with IV antibiotics for HCAP pneumonia and then admitting.  Patient is renal function will not tolerate CT chest with contrast or CTA.  The patient is already on Eliquis.  [facility] think we have a concern for pulmonary embolus.   Fredia Sorrow, MD 03/29/21 2158

## 2021-03-29 NOTE — ED Provider Notes (Signed)
[facility] DEPT Provider Note   CSN: [id number] Arrival date & time: 03/29/21  1923     History  Chief Complaint  Patient presents with   Shortness of Sabrina [name] is a 88 y.o. female.  The history is provided by the patient, the EMS personnel and medical records. No language interpreter was used.  Shortness of Breath  88 year old female significant history of breast cancer, aortic stenosis, history of PE on Eliquis, CHF, CKD, diabetes, hypertension has cardiac pacemaker, obesity presents ED via EMS from home for evaluation of shortness of breath.  Patient lives at home with her 24 hours home health aide available.  Today she noticed she was having more trouble breathing.  Home health aide mentioned that patient is recovering from pneumonia from 2 weeks ago.  EMS report that patient felt warm to the touch.  History is difficult to obtain both due to age and her trouble breathing.  Patient is on home oxygen intermittently.  Initial CBG was 359 per EMS and patient is on oral antiglycemic agent.  Home Medications Prior to Admission medications   Medication Sig Start Date End Date Taking? Authorizing Provider  acetaminophen (TYLENOL) 500 MG tablet Take 500 mg by mouth daily as needed for fever or moderate pain (for foot pain).    [provider]  albuterol (VENTOLIN HFA) 108 (90 Base) MCG/ACT inhaler Inhale 1-2 puffs into the lungs every 6 (six) hours as needed for wheezing or shortness of breath. 03/22/19   [provider]  allopurinol (ZYLOPRIM) 100 MG tablet Take 100 mg by mouth daily. 01/17/16   [provider]  amLODipine (NORVASC) 2.5 MG tablet Take 2.5 mg by mouth daily.    [provider]  benzonatate (TESSALON) 100 MG capsule Take 1 capsule (100 mg total) by mouth every 8 (eight) hours. 03/04/21   Domenic Moras, PA-C  cholecalciferol (VITAMIN D) 1000 UNITS tablet Take 1,000 Units by mouth daily.     [provider]  cloNIDine (CATAPRES) 0.2 MG tablet Take 0.2 mg by mouth 2 (two) times daily.    [provider]  diltiazem (CARDIZEM) 60 MG tablet Take 1 tablet (60 mg total) by mouth every 8 (eight) hours. 02/13/21 03/15/21  Arrien, Jimmy Picket, MD  ELIQUIS 2.5 MG TABS tablet TAKE 1 TABLET BY MOUTH TWICE DAILY. Patient taking differently: Take 2.5 mg by mouth 2 (two) times daily. 07/04/20   Fenton, Clint R, PA  famotidine (PEPCID) 20 MG tablet Take 20 mg by mouth at bedtime as needed for indigestion or heartburn. 11/09/19   [provider]  furosemide (LASIX) 40 MG tablet Take 40 mg by mouth daily. Per Dr. If over 166lb take 80mg  a day    [provider]  gabapentin (NEURONTIN) 100 MG capsule Take 200-300 mg by mouth See admin instructions. 2 capsules bid and 3 capsules at bedtime    [provider]  glipiZIDE (GLUCOTROL) 5 MG tablet Take 2.5-5 mg by mouth See admin instructions. Takes 1 tab every am and 1/2 tab every evening    [provider]  hydrALAZINE (APRESOLINE) 50 MG tablet Take 50 mg by mouth 3 (three) times daily.    [provider]  ipratropium-albuterol (DUONEB) 0.5-2.5 (3) MG/3ML SOLN Take 3 mLs by nebulization every 6 (six) hours as needed (wheezing and shortness of breath). 02/13/21   Arrien, Jimmy Picket, MD  isosorbide mononitrate (IMDUR) 30 MG 24 hr tablet Take 1 tablet (30 mg total)  by mouth daily. 12/22/20 02/07/22  Pokhrel, Corrie Mckusick, MD  levalbuterol Penne Lash) 0.63 MG/3ML nebulizer solution Take 0.63 mg by nebulization every 4 (four) hours as needed for wheezing or shortness of breath.    [provider]  mometasone-formoterol (DULERA) 200-5 MCG/ACT AERO Inhale 2 puffs into the lungs 2 (two) times daily. 02/13/21   Arrien, Jimmy Picket, MD  ondansetron (ZOFRAN-ODT) 8 MG disintegrating tablet Take 8 mg by mouth 2 (two) times daily as needed for nausea or vomiting.    [provider]   senna-docusate (SENOKOT-S) 8.6-50 MG tablet Take 1 tablet by mouth at bedtime as needed for mild constipation. 03/15/21   Donne Hazel, MD  TRUE METRIX BLOOD GLUCOSE TEST test strip  07/19/19   [provider]  TRUEplus Lancets 28G MISC Apply topically. 08/20/19   [provider]      Allergies    Beta adrenergic blockers    Review of Systems   Review of Systems  Respiratory:  Positive for shortness of breath.   All other systems reviewed and are negative.  Physical Exam Updated Vital Signs BP (!) 175/63 (BP Location: Right Arm)    Pulse 95    Temp 98.2 F (36.8 C) (Oral)    Resp (!) 35    SpO2 93%  Physical Exam Vitals and nursing note reviewed.  Constitutional:      Appearance: She is well-developed.     Comments: Elderly obese female, appears tachypneic, speaking in short sentences.  HENT:     Head: Atraumatic.  Eyes:     Conjunctiva/sclera: Conjunctivae normal.  Cardiovascular:     Rate and Rhythm: Rhythm irregular.  Pulmonary:     Effort: Pulmonary effort is normal. Tachypnea present.     Breath sounds: No stridor. Decreased breath sounds present. No wheezing, rhonchi or rales.  Musculoskeletal:     Cervical back: Neck supple.     Right lower leg: No edema.     Left lower leg: No edema.  Skin:    Findings: No rash.  Neurological:     Mental Status: She is alert.     Comments: Patient is alert to self, place, and time along with situation  Psychiatric:        Mood and Affect: Mood normal.    ED Results / Procedures / Treatments   Labs (all labs ordered are listed, but only abnormal results are displayed) Labs Reviewed  CBC WITH DIFFERENTIAL/PLATELET - Abnormal; Notable for the following components:      Result Value   WBC 11.1 (*)    RBC 2.42 (*)    Hemoglobin 7.8 (*)    HCT 23.6 (*)    All other components within normal limits  COMPREHENSIVE METABOLIC PANEL - Abnormal; Notable for the following components:   Sodium 128 (*)    Chloride  90 (*)    Glucose, Bld 313 (*)    BUN 87 (*)    Creatinine, Ser 2.14 (*)    Calcium 10.7 (*)    Albumin 3.2 (*)    GFR, Estimated 21 (*)    All other components within normal limits  BRAIN NATRIURETIC PEPTIDE - Abnormal; Notable for the following components:   B Natriuretic Peptide 247.9 (*)    All other components within normal limits  CBG MONITORING, ED - Abnormal; Notable for the following components:   Glucose-Capillary 261 (*)    All other components within normal limits  RESP PANEL BY RT-PCR (FLU A&B, COVID) ARPGX2  CULTURE, BLOOD (  SINGLE)  LACTIC ACID, PLASMA    EKG EKG Interpretation  Date/Time:  Thursday March 29 2021 19:40:53 EST Ventricular Rate:  94 PR Interval:  170 QRS Duration: 90 QT Interval:  361 QTC Calculation: 452 R Axis:   33 Text Interpretation: Sinus rhythm Borderline ST depression, lateral leads Confirmed by Fredia Sorrow [id number] on 03/29/2021 9:52:54 PM  Radiology DG Chest Portable 1 View  Result Date: 03/29/2021 CLINICAL DATA:  Dyspnea. EXAM: PORTABLE CHEST 1 VIEW COMPARISON:  Chest x-ray 03/09/2021.  CT chest 03/14/2021. FINDINGS: Small right pleural effusion and minimal right basilar opacities persist. The lungs are otherwise clear. Cardiomediastinal silhouette is within normal limits. No pneumothorax. No acute fracture. Right axillary surgical clips are again noted. IMPRESSION: 1. Stable small right pleural effusion with minimal right basilar atelectasis/airspace disease. Electronically Signed   By: Ronney Asters M.D.   On: 03/29/2021 20:29    Procedures Procedures    Medications Ordered in ED Medications  ceFEPIme (MAXIPIME) 1 g in sodium chloride 0.9 % 100 mL IVPB (has no administration in time range)  insulin aspart (novoLOG) injection 5 Units (5 Units Subcutaneous Given 03/29/21 2117)    ED Course/ Medical Decision Making/ A&P                           Medical Decision Making Amount and/or Complexity of Data Reviewed Labs:  ordered. ECG/medicine tests: ordered.  Risk Prescription drug management. Decision regarding hospitalization.   BP (!) 175/63 (BP Location: Right Arm)    Pulse 95    Temp 98.2 F (36.8 C) (Oral)    Resp (!) 35    SpO2 93%   8:44 PM This is an elderly female with multiple pertinent medical history including history of PE currently on Eliquis, breast cancer, CKD, COPD, diabetes, hypertension, CHF who presents with complaints of shortness of breath.  She does have supplemental oxygen at home to use as needed.  Patient states she was leaning forward in a less optimal position and was having more trouble breathing than usual.  She also endorsed cough.  Reportedly had pneumonia several weeks prior.  Patient is also DNR.  On exam, she is tachypneic, and at 3 L of oxygen, her O2 sats is at 97%.  Without supplemental oxygen, O2 sat drops down to 89% on room air.  Labs, EKG, and chest x-ray ordered obtained and interpreted by me.  Chest x-ray shows a stable small right pleural effusion with minimal right basilar atelectasis versus airspace disease.Her lactic acid is normal, her white count is mildly elevated at 11.1.  She has a hemoglobin of 7.8, similar to prior.  Her sodium is 128 likely reflect pseudohyponatremia in the setting of a elevated CBG of 313.  Creatinine is 2.14, similar to prior.  BUN is 87 which is an improvement from 2 weeks ago.  Her viral respiratory panel is currently pending.  8:54 PM I did independently review patient's previous hospital admission approximately 2 weeks ago for shortness of breath which thought to be related to COPD exacerbation and there was no document finding consistent with pneumonia.  Patient was wheezing at that time and was given nebulizing treatment but went into A. fib with RVR requiring medical treatment.  She does have history of chronic respiratory failure on baseline 3 L of oxygen.  9:53 PM Viral resp panel negative for COVID and flu.  CBG did improve to  261.  However on reassessment, patient is more tachypneic and  tachycardic now with a heart rate of 106 and a respiratory rate of 26.  Her blood pressure is currently at 196/57.  Given her worsening shortness of breath and chest x-ray showing possible pneumonia, I discussed care with Dr. Rogene Houston who has also seen evaluate patient.  We felt the patient would benefit from hospital admission to be treated for hospital-acquired pneumonia as patient was recently hospitalized.  I have requested for cefepime and vancomycin for treatment of HCAP.  Patient is currently on Eliquis, I have low suspicion for PE as a source.  She has impaired renal function and therefore not a candidate for CT angiogram.  10:20 PM Appreciate consultation from [facility] Dr. Myna Hidalgo who agrees to see and will admit pt for further management.   This patient presents to the ED for concern of SOB, this involves an extensive number of treatment options, and is a complaint that carries with it a high risk of complications and morbidity.  The differential diagnosis includes PNA, PE, ACS, CHF, PTX, pleural effusion, COPD exacerbation  Co morbidities that complicate the patient evaluation DM, asthma, COPD, CHF Additional history obtained:  Additional history obtained from EMS External records from outside source obtained and reviewed including note from her PCP on 03/22/21 for palliative encounter.  Lab Tests:  I Ordered, and personally interpreted labs.  The pertinent results include:  results as mentioned above  Imaging Studies ordered:  I ordered imaging studies including CXR I independently visualized and interpreted imaging which showed R basilar atelectasis/airspace disease I agree with the radiologist interpretation  Cardiac Monitoring:  The patient was maintained on a cardiac monitor.  I personally viewed and interpreted the cardiac monitored which showed an underlying rhythm of: sinus tachycardia  Medicines  ordered and prescription drug management:  I ordered medication including insulin  for hyperglycemia Reevaluation of the patient after these medicines showed that the patient improved I have reviewed the patients home medicines and have made adjustments as needed  Test Considered: chest CTA  Critical Interventions: supplemental O2, along with broad spectrum abx including cefepime and vanc  Consultations Obtained:  I requested consultation with the hospitalist,  and discussed lab and imaging findings as well as pertinent plan - they recommend: admission  Problem List / ED Course: HCAP  Reevaluation:  After the interventions noted above, I reevaluated the patient and found that they have :stayed the same  Social Determinants of Health: age, comorbidity  Dispostion:  After consideration of the diagnostic results and the patients response to treatment, I feel that the patent would benefit from admission.         Final Clinical Impression(s) / ED Diagnoses Final diagnoses:  HCAP (healthcare-associated pneumonia)  Hyperglycemia    Rx / DC Orders ED Discharge Orders     None         Domenic Moras, PA-C 03/29/21 2222    Fredia Sorrow, MD 03/29/21 [phone]

## 2021-03-29 NOTE — ED Triage Notes (Signed)
88 yo female BIBA from home. Pts home health aide called EMS as she statedpt appeared more SOB than normal. Home health aide reported that pt was recovering from pneumonia 2 weeks ago. Per EMS there was a poor history that was gathered from the aide. Per EMS pt is on O2 periodically at home, the amount is unknown. Per EMS pt feels warm to touch but thermometer reading 98.0 temporal. No other complaints at this time. Pt presents with DNR.  Vitals: Bp 142/50 Hr 94 Rr 30-40 Spo2 99 on 3L (baseline without 02 not obtained) Cbg 359 (pt reports taking oral medication for blood sugar)

## 2021-03-30 ENCOUNTER — Observation Stay (HOSPITAL_COMMUNITY): Payer: Medicare Other

## 2021-03-30 ENCOUNTER — Encounter (HOSPITAL_COMMUNITY): Payer: Self-pay | Admitting: Family Medicine

## 2021-03-30 DIAGNOSIS — Z79899 Other long term (current) drug therapy: Secondary | ICD-10-CM | POA: Diagnosis not present

## 2021-03-30 DIAGNOSIS — E669 Obesity, unspecified: Secondary | ICD-10-CM | POA: Diagnosis present

## 2021-03-30 DIAGNOSIS — Z66 Do not resuscitate: Secondary | ICD-10-CM | POA: Diagnosis present

## 2021-03-30 DIAGNOSIS — I5032 Chronic diastolic (congestive) heart failure: Secondary | ICD-10-CM | POA: Diagnosis present

## 2021-03-30 DIAGNOSIS — Z86711 Personal history of pulmonary embolism: Secondary | ICD-10-CM | POA: Diagnosis not present

## 2021-03-30 DIAGNOSIS — Z950 Presence of cardiac pacemaker: Secondary | ICD-10-CM | POA: Diagnosis not present

## 2021-03-30 DIAGNOSIS — E1165 Type 2 diabetes mellitus with hyperglycemia: Secondary | ICD-10-CM | POA: Diagnosis present

## 2021-03-30 DIAGNOSIS — L89152 Pressure ulcer of sacral region, stage 2: Secondary | ICD-10-CM | POA: Diagnosis present

## 2021-03-30 DIAGNOSIS — J9621 Acute and chronic respiratory failure with hypoxia: Secondary | ICD-10-CM

## 2021-03-30 DIAGNOSIS — J189 Pneumonia, unspecified organism: Secondary | ICD-10-CM | POA: Diagnosis present

## 2021-03-30 DIAGNOSIS — Z7984 Long term (current) use of oral hypoglycemic drugs: Secondary | ICD-10-CM | POA: Diagnosis not present

## 2021-03-30 DIAGNOSIS — Z7951 Long term (current) use of inhaled steroids: Secondary | ICD-10-CM | POA: Diagnosis not present

## 2021-03-30 DIAGNOSIS — N184 Chronic kidney disease, stage 4 (severe): Secondary | ICD-10-CM | POA: Diagnosis present

## 2021-03-30 DIAGNOSIS — Z20822 Contact with and (suspected) exposure to covid-19: Secondary | ICD-10-CM | POA: Diagnosis present

## 2021-03-30 DIAGNOSIS — I700 Atherosclerosis of aorta: Secondary | ICD-10-CM | POA: Diagnosis not present

## 2021-03-30 DIAGNOSIS — J440 Chronic obstructive pulmonary disease with acute lower respiratory infection: Secondary | ICD-10-CM | POA: Diagnosis present

## 2021-03-30 DIAGNOSIS — E7800 Pure hypercholesterolemia, unspecified: Secondary | ICD-10-CM | POA: Diagnosis present

## 2021-03-30 DIAGNOSIS — Z9981 Dependence on supplemental oxygen: Secondary | ICD-10-CM | POA: Diagnosis not present

## 2021-03-30 DIAGNOSIS — Z7901 Long term (current) use of anticoagulants: Secondary | ICD-10-CM | POA: Diagnosis not present

## 2021-03-30 DIAGNOSIS — I480 Paroxysmal atrial fibrillation: Secondary | ICD-10-CM | POA: Diagnosis present

## 2021-03-30 DIAGNOSIS — D631 Anemia in chronic kidney disease: Secondary | ICD-10-CM | POA: Diagnosis present

## 2021-03-30 DIAGNOSIS — Y95 Nosocomial condition: Secondary | ICD-10-CM | POA: Diagnosis present

## 2021-03-30 DIAGNOSIS — E1122 Type 2 diabetes mellitus with diabetic chronic kidney disease: Secondary | ICD-10-CM | POA: Diagnosis present

## 2021-03-30 DIAGNOSIS — M109 Gout, unspecified: Secondary | ICD-10-CM | POA: Diagnosis present

## 2021-03-30 DIAGNOSIS — Z888 Allergy status to other drugs, medicaments and biological substances status: Secondary | ICD-10-CM | POA: Diagnosis not present

## 2021-03-30 DIAGNOSIS — I130 Hypertensive heart and chronic kidney disease with heart failure and stage 1 through stage 4 chronic kidney disease, or unspecified chronic kidney disease: Secondary | ICD-10-CM | POA: Diagnosis present

## 2021-03-30 LAB — CBC WITH DIFFERENTIAL/PLATELET
Abs Immature Granulocytes: 0.03 10*3/uL (ref 0.00–0.07)
Basophils Absolute: 0 10*3/uL (ref 0.0–0.1)
Basophils Relative: 0 %
Eosinophils Absolute: 0.2 10*3/uL (ref 0.0–0.5)
Eosinophils Relative: 2 %
HCT: 22.6 % — ABNORMAL LOW (ref 36.0–46.0)
Hemoglobin: 7.4 g/dL — ABNORMAL LOW (ref 12.0–15.0)
Immature Granulocytes: 0 %
Lymphocytes Relative: 26 %
Lymphs Abs: 2.5 10*3/uL (ref 0.7–4.0)
MCH: 32.2 pg (ref 26.0–34.0)
MCHC: 32.7 g/dL (ref 30.0–36.0)
MCV: 98.3 fL (ref 80.0–100.0)
Monocytes Absolute: 0.6 10*3/uL (ref 0.1–1.0)
Monocytes Relative: 7 %
Neutro Abs: 6 10*3/uL (ref 1.7–7.7)
Neutrophils Relative %: 65 %
Platelets: 234 10*3/uL (ref 150–400)
RBC: 2.3 MIL/uL — ABNORMAL LOW (ref 3.87–5.11)
RDW: 14.9 % (ref 11.5–15.5)
WBC: 9.3 10*3/uL (ref 4.0–10.5)
nRBC: 0 % (ref 0.0–0.2)

## 2021-03-30 LAB — STREP PNEUMONIAE URINARY ANTIGEN: Strep Pneumo Urinary Antigen: NEGATIVE

## 2021-03-30 LAB — BASIC METABOLIC PANEL
Anion gap: 8 (ref 5–15)
BUN: 83 mg/dL — ABNORMAL HIGH (ref 8–23)
CO2: 29 mmol/L (ref 22–32)
Calcium: 9.8 mg/dL (ref 8.9–10.3)
Chloride: 92 mmol/L — ABNORMAL LOW (ref 98–111)
Creatinine, Ser: 2.17 mg/dL — ABNORMAL HIGH (ref 0.44–1.00)
GFR, Estimated: 21 mL/min — ABNORMAL LOW (ref >60)
Glucose, Bld: 315 mg/dL — ABNORMAL HIGH (ref 70–99)
Potassium: 3.9 mmol/L (ref 3.5–5.1)
Sodium: 129 mmol/L — ABNORMAL LOW (ref 135–145)

## 2021-03-30 LAB — CBG MONITORING, ED
Glucose-Capillary: 328 mg/dL — ABNORMAL HIGH (ref 70–99)
Glucose-Capillary: 243 mg/dL — ABNORMAL HIGH (ref 70–99)
Glucose-Capillary: 169 mg/dL — ABNORMAL HIGH (ref 70–99)
Glucose-Capillary: 183 mg/dL — ABNORMAL HIGH (ref 70–99)

## 2021-03-30 LAB — GLUCOSE, CAPILLARY
Glucose-Capillary: 180 mg/dL — ABNORMAL HIGH (ref 70–99)
Glucose-Capillary: 191 mg/dL — ABNORMAL HIGH (ref 70–99)

## 2021-03-30 LAB — PROCALCITONIN: Procalcitonin: 0.75 ng/mL

## 2021-03-30 LAB — MAGNESIUM: Magnesium: 2 mg/dL (ref 1.7–2.4)

## 2021-03-30 MED ORDER — ACETAMINOPHEN 325 MG PO TABS: 650.0000 mg | ORAL_TABLET | Freq: Four times a day (QID) | ORAL | Status: DC | PRN

## 2021-03-30 MED ORDER — CLONIDINE HCL 0.1 MG PO TABS
0.2000 mg | ORAL_TABLET | Freq: Two times a day (BID) | ORAL | Status: DC
  Administered 2021-03-30 – 2021-04-01 (×5): 0.2 mg via ORAL
  Filled 2021-03-30 (×5): qty 2

## 2021-03-30 MED ORDER — APIXABAN 2.5 MG PO TABS
2.5000 mg | ORAL_TABLET | Freq: Two times a day (BID) | ORAL | Status: DC
  Administered 2021-03-30 – 2021-04-01 (×5): 2.5 mg via ORAL
  Filled 2021-03-30 (×5): qty 1

## 2021-03-30 MED ORDER — ACETAMINOPHEN 650 MG RE SUPP: 650.0000 mg | Freq: Four times a day (QID) | RECTAL | Status: DC | PRN

## 2021-03-30 MED ORDER — INSULIN ASPART 100 UNIT/ML IJ SOLN
0.0000 [IU] | Freq: Every day | INTRAMUSCULAR | Status: DC
  Administered 2021-03-30: 4 [IU] via SUBCUTANEOUS
  Administered 2021-03-31: 2 [IU] via SUBCUTANEOUS
  Administered 2021-04-01: 3 [IU] via SUBCUTANEOUS
  Filled 2021-03-30: qty 0.05

## 2021-03-30 MED ORDER — FUROSEMIDE 10 MG/ML IJ SOLN
20.0000 mg | Freq: Once | INTRAMUSCULAR | Status: AC
  Administered 2021-03-30: 20 mg via INTRAVENOUS
  Filled 2021-03-30: qty 4

## 2021-03-30 MED ORDER — INSULIN ASPART 100 UNIT/ML IJ SOLN
0.0000 [IU] | Freq: Three times a day (TID) | INTRAMUSCULAR | Status: DC
  Administered 2021-03-30: 3 [IU] via SUBCUTANEOUS
  Administered 2021-03-31: 7 [IU] via SUBCUTANEOUS
  Administered 2021-04-01 (×2): 5 [IU] via SUBCUTANEOUS
  Administered 2021-04-02: 3 [IU] via SUBCUTANEOUS
  Administered 2021-04-02: 7 [IU] via SUBCUTANEOUS
  Filled 2021-03-30: qty 0.09

## 2021-03-30 MED ORDER — MOMETASONE FURO-FORMOTEROL FUM 200-5 MCG/ACT IN AERO
2.0000 | INHALATION_SPRAY | Freq: Two times a day (BID) | RESPIRATORY_TRACT | Status: DC
  Administered 2021-03-31 – 2021-04-02 (×4): 2 via RESPIRATORY_TRACT
  Filled 2021-03-30: qty 8.8

## 2021-03-30 MED ORDER — ISOSORBIDE MONONITRATE ER 30 MG PO TB24
30.0000 mg | ORAL_TABLET | Freq: Every day | ORAL | Status: DC
  Administered 2021-03-30 – 2021-04-02 (×4): 30 mg via ORAL
  Filled 2021-03-30 (×4): qty 1

## 2021-03-30 MED ORDER — ONDANSETRON HCL 4 MG/2ML IJ SOLN: 4.0000 mg | Freq: Four times a day (QID) | INTRAMUSCULAR | Status: DC | PRN

## 2021-03-30 MED ORDER — DILTIAZEM HCL 60 MG PO TABS
60.0000 mg | ORAL_TABLET | Freq: Three times a day (TID) | ORAL | Status: DC
  Administered 2021-03-30 – 2021-04-02 (×9): 60 mg via ORAL
  Filled 2021-03-30 (×2): qty 1
  Filled 2021-03-30: qty 2
  Filled 2021-03-30 (×7): qty 1

## 2021-03-30 MED ORDER — SODIUM CHLORIDE 0.9 % IV SOLN
500.0000 mg | INTRAVENOUS | Status: DC
  Administered 2021-03-30 – 2021-04-01 (×4): 500 mg via INTRAVENOUS
  Filled 2021-03-30 (×4): qty 5

## 2021-03-30 MED ORDER — AMLODIPINE BESYLATE 5 MG PO TABS
2.5000 mg | ORAL_TABLET | Freq: Every day | ORAL | Status: DC
  Administered 2021-03-30 – 2021-04-01 (×3): 2.5 mg via ORAL
  Filled 2021-03-30 (×3): qty 1

## 2021-03-30 MED ORDER — FUROSEMIDE 10 MG/ML IJ SOLN: 40.0000 mg | Freq: Two times a day (BID) | INTRAMUSCULAR | Status: DC

## 2021-03-30 MED ORDER — SODIUM CHLORIDE 0.9 % IV SOLN
2.0000 g | INTRAVENOUS | Status: DC
  Administered 2021-03-30 – 2021-04-01 (×4): 2 g via INTRAVENOUS
  Filled 2021-03-30 (×4): qty 20

## 2021-03-30 MED ORDER — FUROSEMIDE 10 MG/ML IJ SOLN
40.0000 mg | Freq: Two times a day (BID) | INTRAMUSCULAR | Status: DC
  Administered 2021-03-30 – 2021-04-01 (×4): 40 mg via INTRAVENOUS
  Filled 2021-03-30 (×4): qty 4

## 2021-03-30 MED ORDER — LEVALBUTEROL HCL 0.63 MG/3ML IN NEBU: 0.6300 mg | INHALATION_SOLUTION | RESPIRATORY_TRACT | Status: DC | PRN

## 2021-03-30 MED ORDER — CHLORHEXIDINE GLUCONATE CLOTH 2 % EX PADS
6.0000 | MEDICATED_PAD | Freq: Every day | CUTANEOUS | Status: DC
  Administered 2021-03-30 – 2021-04-02 (×4): 6 via TOPICAL

## 2021-03-30 MED ORDER — CLONIDINE HCL 0.1 MG PO TABS
0.2000 mg | ORAL_TABLET | Freq: Two times a day (BID) | ORAL | Status: DC
Start: 2021-03-30 — End: 2021-03-30

## 2021-03-30 MED ORDER — DILTIAZEM HCL 30 MG PO TABS: 60.0000 mg | ORAL_TABLET | Freq: Three times a day (TID) | ORAL | Status: DC

## 2021-03-30 MED ORDER — FUROSEMIDE 40 MG PO TABS
40.0000 mg | ORAL_TABLET | Freq: Every day | ORAL | Status: DC
  Administered 2021-03-30: 40 mg via ORAL
  Filled 2021-03-30: qty 1

## 2021-03-30 MED ORDER — ONDANSETRON HCL 4 MG PO TABS: 4.0000 mg | ORAL_TABLET | Freq: Four times a day (QID) | ORAL | Status: DC | PRN

## 2021-03-30 MED ORDER — HYDRALAZINE HCL 50 MG PO TABS
50.0000 mg | ORAL_TABLET | Freq: Three times a day (TID) | ORAL | Status: DC
  Administered 2021-03-30 – 2021-04-01 (×6): 50 mg via ORAL
  Filled 2021-03-30 (×4): qty 1
  Filled 2021-03-30 (×2): qty 2

## 2021-03-30 MED ORDER — SODIUM CHLORIDE 0.9% FLUSH
3.0000 mL | Freq: Two times a day (BID) | INTRAVENOUS | Status: DC
  Administered 2021-03-30 – 2021-04-02 (×8): 3 mL via INTRAVENOUS

## 2021-03-30 NOTE — H&P (Signed)
History and Physical    Taylin Mans VHQ:[id number] DOB: [birth year] DOA: 03/29/2021  PCP: [name], MD   Patient coming from: Home   Chief Complaint: Labored breathing, productive cough   HPI: [facility] Sahiti Joswick is a pleasant 88 y.o. female with medical history significant for COPD, chronic diastolic CHF, chronic hypoxic respiratory failure, CKD 4, history of DVT and PAF on Eliquis, hypertension, and anemia, now presenting with increased work of breathing and productive cough.  She is accompanied by a caregiver who assists with the history.  The patient has caregivers at her house around-the-clock and was noted to have labored breathing today.  She has also reportedly been producing copious amounts of sputum this evening.  Patient was not complaining of anything.  At baseline, she is talkative but nonambulatory and requires assistance with eating and dressing.  She takes a soft diet and has been taking her pills in applesauce.  ED Course: Upon arrival to the ED, patient is found to be afebrile, saturating mid to upper 90s on 3 L/min of supplemental oxygen, tachypneic, and with labile blood pressures.  EKG features sinus rhythm and chest x-ray notable for stable small right pleural effusion and minimal right basilar atelectasis or airspace disease.  Chemistry panel features a glucose 313, BUN 87, and creatinine 2.14.  CBC notable for WBC 11,100 and hemoglobin 7.8.  BNP is 248 and lactic acid normal.  COVID and influenza negative.  Blood cultures were collected in the ED and the patient was given 5 units of NovoLog, cefepime, and vancomycin.  Review of Systems:  Unable to complete ROS secondary the patient's clinical condition.  Past Medical History:  Diagnosis Date   Arthritis    "mostly in my hands, lower back" (06/25/2017)   Basal cell carcinoma (BCC) of face 1983   Breast cancer, right breast ([place]) 03/2013   Chronic kidney disease (CKD), stage III (moderate) ([place])    nephrologist,  Dr. [name]   Dental crowns present    DVT (deep venous thrombosis) ([place]) ~ 06/2013   "? side"   Gout    "on daily RX" (06/25/2017)   Heart murmur    no known problems; states did not know she had murmur until age 30   High cholesterol    Hypertension    fluctuates, especially when stressed; has been on med. > 20 yr.   Immature cataract    Non-insulin dependent type 2 diabetes mellitus ([place])    Personal history of chemotherapy    Personal history of radiation therapy    Pulmonary embolism ([place]) ~ 06/2013   Radiation 09/06/13-10/20/13   Right Breast Cancer   Stroke ([place]) 05/2017   just visual problems since (06/25/2017)   Wears partial dentures    lower    Past Surgical History:  Procedure Laterality Date   AXILLARY LYMPH NODE DISSECTION Right 03/30/2013   Procedure: AXILLARY LYMPH NODE DISSECTION;  Surgeon: Rolm Bookbinder, MD;  Location: [place];  Service: General;  Laterality: Right;   BASAL CELL CARCINOMA EXCISION  1983   "face"   BREAST BIOPSY Right 03/17/2013   BREAST CYST EXCISION Right 11/1958   benign   BREAST LUMPECTOMY Right 03/30/2013   BREAST LUMPECTOMY WITH NEEDLE LOCALIZATION Right 03/30/2013   Procedure: BREAST LUMPECTOMY WITH NEEDLE LOCALIZATION;  Surgeon: Rolm Bookbinder, MD;  Location: [place];  Service: General;  Laterality: Right;   DILATION AND CURETTAGE OF UTERUS     LOOP RECORDER INSERTION N/A 08/15/2017   Procedure: LOOP RECORDER INSERTION;  Surgeon: Evans Lance, MD;  Location: [place] [facility];  Service: Cardiovascular;  Laterality: N/A;   PORT-A-CATH REMOVAL  2016   PORTACATH PLACEMENT N/A 04/15/2013   Procedure: INSERTION PORT-A-CATH;  Surgeon: Rolm Bookbinder, MD;  Location: [place];  Service: General;  Laterality: N/A;   RE-EXCISION OF BREAST CANCER,SUPERIOR MARGINS Right 04/15/2013   Procedure: RE-EXCISION OF RIGHT BREAST  MARGINS;  Surgeon: Rolm Bookbinder, MD;  Location: [place];  Service: General;   Laterality: Right;   TONSILLECTOMY  ~ 1935/1936    Social History:   reports that she has never smoked. She has never used smokeless tobacco. She reports that she does not currently use alcohol after a past usage of about 3.0 standard drinks per week. She reports that she does not use drugs.  Allergies  Allergen Reactions   Beta Adrenergic Blockers Other (See Comments)    Fatigue    Family History  Problem Relation Age of Onset   Pneumonia Mother    Heart attack Father    Breast cancer Other 66       niece   Breast cancer Other 18       niece   Ovarian cancer Other        niece     Prior to Admission medications   Medication Sig Start Date End Date Taking? Authorizing Provider  acetaminophen (TYLENOL) 500 MG tablet Take 500 mg by mouth daily as needed for fever or moderate pain (for foot pain).    [provider]  albuterol (VENTOLIN HFA) 108 (90 Base) MCG/ACT inhaler Inhale 1-2 puffs into the lungs every 6 (six) hours as needed for wheezing or shortness of breath. 03/22/19   [provider]  allopurinol (ZYLOPRIM) 100 MG tablet Take 100 mg by mouth daily. 01/17/16   [provider]  amLODipine (NORVASC) 2.5 MG tablet Take 2.5 mg by mouth daily.    [provider]  benzonatate (TESSALON) 100 MG capsule Take 1 capsule (100 mg total) by mouth every 8 (eight) hours. 03/04/21   Domenic Moras, PA-C  cholecalciferol (VITAMIN D) 1000 UNITS tablet Take 1,000 Units by mouth daily.    [provider]  cloNIDine (CATAPRES) 0.2 MG tablet Take 0.2 mg by mouth 2 (two) times daily.    [provider]  diltiazem (CARDIZEM) 60 MG tablet Take 1 tablet (60 mg total) by mouth every 8 (eight) hours. 02/13/21 03/15/21  Arrien, Jimmy Picket, MD  ELIQUIS 2.5 MG TABS tablet TAKE 1 TABLET BY MOUTH TWICE DAILY. Patient taking differently: Take 2.5 mg by mouth 2 (two) times daily. 07/04/20   Fenton, Clint R, PA  famotidine (PEPCID) 20 MG tablet Take 20 mg  by mouth at bedtime as needed for indigestion or heartburn. 11/09/19   [provider]  furosemide (LASIX) 40 MG tablet Take 40 mg by mouth daily. Per Dr. If over 166lb take 80mg  a day    [provider]  gabapentin (NEURONTIN) 100 MG capsule Take 200-300 mg by mouth See admin instructions. 2 capsules bid and 3 capsules at bedtime    [provider]  glipiZIDE (GLUCOTROL) 5 MG tablet Take 2.5-5 mg by mouth See admin instructions. Takes 1 tab every am and 1/2 tab every evening    [provider]  hydrALAZINE (APRESOLINE) 50 MG tablet Take 50 mg by mouth 3 (three) times daily.    [provider]  ipratropium-albuterol (DUONEB) 0.5-2.5 (3) MG/3ML SOLN Take 3 mLs by nebulization every  6 (six) hours as needed (wheezing and shortness of breath). 02/13/21   Arrien, Jimmy Picket, MD  isosorbide mononitrate (IMDUR) 30 MG 24 hr tablet Take 1 tablet (30 mg total) by mouth daily. 12/22/20 02/07/22  Pokhrel, Corrie Mckusick, MD  levalbuterol Penne Lash) 0.63 MG/3ML nebulizer solution Take 0.63 mg by nebulization every 4 (four) hours as needed for wheezing or shortness of breath.    [provider]  mometasone-formoterol (DULERA) 200-5 MCG/ACT AERO Inhale 2 puffs into the lungs 2 (two) times daily. 02/13/21   Arrien, Jimmy Picket, MD  ondansetron (ZOFRAN-ODT) 8 MG disintegrating tablet Take 8 mg by mouth 2 (two) times daily as needed for nausea or vomiting.    [provider]  senna-docusate (SENOKOT-S) 8.6-50 MG tablet Take 1 tablet by mouth at bedtime as needed for mild constipation. 03/15/21   Donne Hazel, MD  TRUE METRIX BLOOD GLUCOSE TEST test strip  07/19/19   [provider]  TRUEplus Lancets 28G MISC Apply topically. 08/20/19   [provider]    Physical Exam: Vitals:   03/30/21 0000 03/30/21 0021 03/30/21 0030 03/30/21 0100  BP: (!) 122/35  (!) 120/39 (!) 109/42  Pulse: 73  62 65  Resp: 17  15 16   Temp:      TempSrc:       SpO2: 98%  99% 100%  Weight:  75 kg    Height:  4\' 11"  (1.499 m)      Constitutional: NAD, calm  Eyes: PERTLA, lids and conjunctivae normal ENMT: Mucous membranes are moist. Posterior pharynx clear of any exudate or lesions.   Neck: supple, no masses  Respiratory: no wheezing, no crackles. No accessory muscle use.  Cardiovascular: S1 & S2 heard, regular rate and rhythm. No extremity edema.   Abdomen: no tenderness, soft. Bowel sounds active.  Musculoskeletal: no clubbing / cyanosis. No joint deformity upper and lower extremities.   Skin: no significant rashes, lesions, ulcers. Warm, dry, well-perfused. Neurologic: CN 2-12 grossly intact. Moving all extremities. Sleeping, wakes to loud voice.  Psychiatric: Calm. Cooperative.    Labs and Imaging on Admission: I have personally reviewed following labs and imaging studies  CBC: Recent Labs  Lab 03/29/21 1959  WBC 11.1*  NEUTROABS 7.6  HGB 7.8*  HCT 23.6*  MCV 97.5  PLT 664   Basic Metabolic Panel: Recent Labs  Lab 03/29/21 1959  NA 128*  K 3.8  CL 90*  CO2 27  GLUCOSE 313*  BUN 87*  CREATININE 2.14*  CALCIUM 10.7*   GFR: Estimated Creatinine Clearance: 14.8 mL/min (A) (by C-G formula based on SCr of 2.14 mg/dL (H)). Liver Function Tests: Recent Labs  Lab 03/29/21 1959  AST 15  ALT 16  ALKPHOS 80  BILITOT 0.7  PROT 6.6  ALBUMIN 3.2*   No results for input(s): LIPASE, AMYLASE in the last 168 hours. No results for input(s): AMMONIA in the last 168 hours. Coagulation Profile: No results for input(s): INR, PROTIME in the last 168 hours. Cardiac Enzymes: No results for input(s): CKTOTAL, CKMB, CKMBINDEX, TROPONINI in the last 168 hours. BNP (last 3 results) No results for input(s): PROBNP in the last 8760 hours. HbA1C: No results for input(s): HGBA1C in the last 72 hours. CBG: Recent Labs  Lab 03/29/21 2112  GLUCAP 261*   Lipid Profile: No results for input(s): CHOL, HDL, LDLCALC, TRIG, CHOLHDL,  LDLDIRECT in the last 72 hours. Thyroid Function Tests: No results for input(s): TSH, T4TOTAL, FREET4, T3FREE, THYROIDAB in the last 72 hours. Anemia  Panel: No results for input(s): VITAMINB12, FOLATE, FERRITIN, TIBC, IRON, RETICCTPCT in the last 72 hours. Urine analysis:    Component Value Date/Time   COLORURINE YELLOW 02/07/2021 2200   APPEARANCEUR CLEAR 02/07/2021 2200   LABSPEC <=1.005 02/07/2021 2200   PHURINE 6.0 02/07/2021 2200   GLUCOSEU 100 (A) 02/07/2021 2200   HGBUR NEGATIVE 02/07/2021 2200   BILIRUBINUR NEGATIVE 02/07/2021 2200   KETONESUR NEGATIVE 02/07/2021 2200   PROTEINUR NEGATIVE 02/07/2021 2200   UROBILINOGEN 0.2 09/13/2013 0523   NITRITE NEGATIVE 02/07/2021 2200   LEUKOCYTESUR SMALL (A) 02/07/2021 2200   Sepsis Labs: @LABRCNTIP (procalcitonin:4,lacticidven:4) ) Recent Results (from the past 240 hour(s))  Resp Panel by RT-PCR (Flu A&B, Covid) Nasopharyngeal Swab     Status: None   Collection Time: 03/29/21  8:00 PM   Specimen: Nasopharyngeal Swab; Nasopharyngeal(NP) swabs in vial transport medium  Result Value Ref Range Status   SARS Coronavirus 2 by RT PCR NEGATIVE NEGATIVE Final    Comment: (NOTE) SARS-CoV-2 target nucleic acids are NOT DETECTED.  The SARS-CoV-2 RNA is generally detectable in upper respiratory specimens during the acute phase of infection. The lowest concentration of SARS-CoV-2 viral copies this assay can detect is 138 copies/mL. A negative result does not preclude SARS-Cov-2 infection and should not be used as the sole basis for treatment or other patient management decisions. A negative result may occur with  improper specimen collection/handling, submission of specimen other than nasopharyngeal swab, presence of viral mutation(s) within the areas targeted by this assay, and inadequate number of viral copies(<138 copies/mL). A negative result must be combined with clinical observations, patient history, and epidemiological information.  The expected result is Negative.  Fact Sheet for Patients:  EntrepreneurPulse.com.au  Fact Sheet for Healthcare Providers:  IncredibleEmployment.be  This test is no t yet approved or cleared by the [place] FDA and  has been authorized for detection and/or diagnosis of SARS-CoV-2 by FDA under an Emergency Use Authorization (EUA). This EUA will remain  in effect (meaning this test can be used) for the duration of the COVID-19 declaration under Section 564(b)(1) of the Act, 21 U.S.C.section 360bbb-3(b)(1), unless the authorization is terminated  or revoked sooner.       Influenza A by PCR NEGATIVE NEGATIVE Final   Influenza B by PCR NEGATIVE NEGATIVE Final    Comment: (NOTE) The Xpert Xpress SARS-CoV-2/FLU/RSV plus assay is intended as an aid in the diagnosis of influenza from Nasopharyngeal swab specimens and should not be used as a sole basis for treatment. Nasal washings and aspirates are unacceptable for Xpert Xpress SARS-CoV-2/FLU/RSV testing.  Fact Sheet for Patients: EntrepreneurPulse.com.au  Fact Sheet for Healthcare Providers: IncredibleEmployment.be  This test is not yet approved or cleared by the [place] FDA and has been authorized for detection and/or diagnosis of SARS-CoV-2 by FDA under an Emergency Use Authorization (EUA). This EUA will remain in effect (meaning this test can be used) for the duration of the COVID-19 declaration under Section 564(b)(1) of the Act, 21 U.S.C. section 360bbb-3(b)(1), unless the authorization is terminated or revoked.  Performed at [facility], [place] [address] [place], [place] [address]      Radiological Exams on Admission: DG Chest Portable 1 View  Result Date: 03/29/2021 CLINICAL DATA:  Dyspnea. EXAM: PORTABLE CHEST 1 VIEW COMPARISON:  Chest x-ray 03/09/2021.  CT chest 03/14/2021. FINDINGS: Small right pleural effusion and  minimal right basilar opacities persist. The lungs are otherwise clear. Cardiomediastinal silhouette is within normal limits. No pneumothorax. No acute fracture.  Right axillary surgical clips are again noted. IMPRESSION: 1. Stable small right pleural effusion with minimal right basilar atelectasis/airspace disease. Electronically Signed   By: Ronney Asters M.D.   On: 03/29/2021 20:29    EKG: Independently reviewed. Sinus rhythm.   Assessment/Plan   1. Pneumonia  - Presents with labored respirations and productive cough concerning for PNA  - She was started on vanc and cefepime in ED  - Check sputum culture, check strep pneumo and legionella antigens, treat with Rocephin and azithromycin for now, trend procalcitonin and follow cultures and clinical course    2. COPD; chronic hypoxic respiratory failure  - No wheezing on admission, sat upper 90s on her usual 3 Lpm  - Continue ICS/LABA and as-needed SABA    3. HFpEF  - Appears compensated  - EF was 60-65% earlier this month  - Continue Lasix, monitor volume status    4. PAF  - In SR on admission  - Continue Eliquis, diltiazem    5. Type II DM  - A1c was 8.8% in January 2023  - Serum glucose 313 in ED  - Check CBGs and use SSI for now    6. HTN  - Pressures labile in ED, cautiously continue home regimen    7. CKD IV  - SCr is 2.14 on admission, appears close to baseline  - Renally-dose medications, monitor   8. Anemia  - Appears stable, no overt bleeding, likely d/t CKD     DVT prophylaxis: Eliquis  Code Status: DNR, confirmed on admission  Level of Care: Level of care: Telemetry Family Communication: Caregiver at bedside  Disposition Plan:  Patient is from: Home  Anticipated d/c is to: Home  Anticipated d/c date is: 1/27 or 03/31/21  Patient currently: Pending stable respiratory status  Consults called: None  Admission status: Observation     Vianne Bulls, MD [facility]  03/30/2021, 1:34 AM

## 2021-03-30 NOTE — Progress Notes (Signed)
PROGRESS NOTE  Sabrina [name] HXT:[id number] DOB: 16-Apr-1926 DOA: 03/29/2021 PCP: [name], MD  Brief History   Sabrina [name] is a pleasant 88 y.o. female with medical history significant for COPD, chronic diastolic CHF, chronic hypoxic respiratory failure, CKD 4, history of DVT and PAF on Eliquis, hypertension, and anemia, now presenting with increased work of breathing and productive cough.  She is accompanied by a caregiver who assists with the history.  The patient has caregivers at her house around-the-clock and was noted to have labored breathing today.  She has also reportedly been producing copious amounts of sputum this evening.  Patient was not complaining of anything.  At baseline, she is talkative but nonambulatory and requires assistance with eating and dressing.  She takes a soft diet and has been taking her pills in applesauce.   ED Course: Upon arrival to the ED, patient is found to be afebrile, saturating mid to upper 90s on 3 L/min of supplemental oxygen, tachypneic, and with labile blood pressures.  EKG features sinus rhythm and chest x-ray notable for stable small right pleural effusion and minimal right basilar atelectasis or airspace disease.  Chemistry panel features a glucose 313, BUN 87, and creatinine 2.14.  CBC notable for WBC 11,100 and hemoglobin 7.8.  BNP is 248 and lactic acid normal.  COVID and influenza negative.  Blood cultures were collected in the ED and the patient was given 5 units of NovoLog, cefepime, and vancomycin.  At home the patient is on 2L O2, although her PCP has told her that she doesn't need O2. Pt is very somnolent on my visit. She was on 3L O2 and saturating 100%. I was concerned that the patient could be somnolent due to hypercarbia due to retention related to high SaO2. Her O2 was stopped. Nursing and caregiver at the patient's bedside were advised.  Consultants  None  Procedures  None  Antibiotics   Anti-infectives (From  admission, onward)    Start     Dose/Rate Route Frequency Ordered Stop   03/30/21 0145  cefTRIAXone (ROCEPHIN) 2 g in sodium chloride 0.9 % 100 mL IVPB        2 g 200 mL/hr over 30 Minutes Intravenous Every 24 hours 03/30/21 0133 04/04/21 0144   03/30/21 0145  azithromycin (ZITHROMAX) 500 mg in sodium chloride 0.9 % 250 mL IVPB        500 mg 250 mL/hr over 60 Minutes Intravenous Every 24 hours 03/30/21 0133 04/04/21 0144   03/29/21 2215  vancomycin (VANCOCIN) IVPB 1000 mg/200 mL premix        1,000 mg 200 mL/hr over 60 Minutes Intravenous  Once 03/29/21 2200 03/30/21 0018   03/29/21 2200  ceFEPIme (MAXIPIME) 2 g in sodium chloride 0.9 % 100 mL IVPB        2 g 200 mL/hr over 30 Minutes Intravenous  Once 03/29/21 2152 03/29/21 2310      Subjective  The patient is resting comfortably. No new complaints.  Objective   Vitals:  Vitals:   03/30/21 1645 03/30/21 1649  BP:  (!) 158/49  Pulse: 85 89  Resp: 20 19  Temp:  98.6 F (37 C)  SpO2: 92% 93%    Exam:  Constitutional:  The patient is somnolent, however she does open her eyes and respond verbally to voice. No acute distress. Eyes:  pupils and irises appear normal Normal lids and conjunctivae ENMT:  grossly normal hearing  Lips appear normal external ears, nose appear normal Oropharynx:  mucosa, tongue,posterior pharynx appear normal Neck:  neck appears normal, no masses, normal ROM, supple no thyromegaly Respiratory:  CTA bilaterally, no w/r/r.  Respiratory effort normal. No retractions or accessory muscle use Cardiovascular:  RRR, no m/r/g No LE extremity edema   Normal pedal pulses Abdomen:  Abdomen appears normal; no tenderness or masses No hernias No HSM Musculoskeletal:  Digits/nails BUE: no clubbing, cyanosis, petechiae, infection exam of joints, bones, muscles of at least one of following: head/neck, RUE, LUE, RLE, LLE   strength and tone normal, no atrophy, no abnormal movements No tenderness,  masses Normal ROM, no contractures  gait and station Skin:  No rashes, lesions, ulcers palpation of skin: no induration or nodules Neurologic:  Moving all extremities Psychiatric:  Somnolent.   I have personally reviewed the following:   Today's Data  Vitals  Lab Data  CBC CMP  Micro Data  Blood cultures x 2: No growth  Imaging  CTA chest  Cardiology Data  EKG  Other Data    Scheduled Meds:  amLODipine  2.5 mg Oral Daily   apixaban  2.5 mg Oral BID   Chlorhexidine Gluconate Cloth  6 each Topical Daily   cloNIDine  0.2 mg Oral BID   diltiazem  60 mg Oral Q8H   furosemide  40 mg Intravenous BID   hydrALAZINE  50 mg Oral TID   insulin aspart  0-5 Units Subcutaneous QHS   insulin aspart  0-9 Units Subcutaneous TID WC   isosorbide mononitrate  30 mg Oral Daily   mometasone-formoterol  2 puff Inhalation BID   sodium chloride flush  3 mL Intravenous Q12H   Continuous Infusions:  azithromycin Stopped (03/30/21 0338)   cefTRIAXone (ROCEPHIN)  IV Stopped (03/30/21 0235)    Principal Problem:   Acute and chronic respiratory failure with hypoxia (HCC) Active Problems:   Chronic diastolic congestive heart failure (HCC)   CKD (chronic kidney disease), stage IV (HCC)   Type 2 diabetes mellitus with stage 4 chronic kidney disease (HCC)   Personal history of DVT and pulm embolus   Paroxysmal atrial fibrillation (HCC)   Personal history of other venous thrombosis and embolism   Normocytic anemia   COPD (chronic obstructive pulmonary disease) ([place])   Pneumonia   LOS: 0 days    A & P  1. Pneumonia  - Presents with labored respirations and productive cough concerning for PNA  - She was started on vanc and cefepime in ED  - Check sputum culture, check strep pneumo and legionella antigens, treat with Rocephin and azithromycin for now, trend procalcitonin and follow cultures and clinical course    - Repeat CT chest demonstrates near resolution of prior appearance of  nodular diffuse ground glass changes. There is continued appearance of a right lower lobe effusion that results in volume loss and possible consolidation of the right lung base. Procalcitonin is 0.75. Will continue IV antibiotics.   2. COPD; chronic hypoxic respiratory failure  - No wheezing on admission - Continue ICS/LABA and as-needed SABA   - The patient is saturating 91-94 % on room air. - Avoid SaO2 greater than 94% to avoid CO2 retention.   3. HFpEF  - Appears compensated  - EF was 60-65% earlier this month  - Continue Lasix, monitor volume status     4. PAF  - In SR on admission  - Continue Eliquis, diltiazem     5. Type II DM  - A1c was 8.8% in January 2023  - Serum glucose  313 in ED  - Check CBGs and use SSI for now     6. HTN  - Pressures labile in ED, cautiously continue home regimen     7. CKD IV  - SCr is 2.14 on admission, appears close to baseline  - Renally-dose medications, monitor    8. Anemia  - Appears stable, no overt bleeding, likely d/t CKD. Hemoglobin is 7.4 this am. Monitor and transfuse for hemoglobin less than 7.0 gm.  I have seen and examined this patient myself. I have spent 34 minutes in her evaluation and care.     DVT prophylaxis: Eliquis  Code Status: DNR, confirmed on admission  Level of Care: Level of care: Telemetry Family Communication: Caregiver at bedside  Disposition Plan:  Patient is from: Home  Anticipated d/c is to: Home    Braxton Vantrease, DO [facility] Direct contact: see [email or web address]  7PM-7AM contact night coverage as above 03/30/2021, 6:04 PM  LOS: 0 days

## 2021-03-31 LAB — CBC WITH DIFFERENTIAL/PLATELET
Abs Immature Granulocytes: 0.06 10*3/uL (ref 0.00–0.07)
Basophils Absolute: 0.1 10*3/uL (ref 0.0–0.1)
Basophils Relative: 0 %
Eosinophils Absolute: 0 10*3/uL (ref 0.0–0.5)
Eosinophils Relative: 0 %
HCT: 24.5 % — ABNORMAL LOW (ref 36.0–46.0)
Hemoglobin: 7.7 g/dL — ABNORMAL LOW (ref 12.0–15.0)
Immature Granulocytes: 1 %
Lymphocytes Relative: 10 %
Lymphs Abs: 1.2 10*3/uL (ref 0.7–4.0)
MCH: 31.8 pg (ref 26.0–34.0)
MCHC: 31.4 g/dL (ref 30.0–36.0)
MCV: 101.2 fL — ABNORMAL HIGH (ref 80.0–100.0)
Monocytes Absolute: 0.5 10*3/uL (ref 0.1–1.0)
Monocytes Relative: 4 %
Neutro Abs: 10.3 10*3/uL — ABNORMAL HIGH (ref 1.7–7.7)
Neutrophils Relative %: 85 %
Platelets: 280 10*3/uL (ref 150–400)
RBC: 2.42 MIL/uL — ABNORMAL LOW (ref 3.87–5.11)
RDW: 14.8 % (ref 11.5–15.5)
WBC: 12.2 10*3/uL — ABNORMAL HIGH (ref 4.0–10.5)
nRBC: 0 % (ref 0.0–0.2)

## 2021-03-31 LAB — BASIC METABOLIC PANEL
Anion gap: 13 (ref 5–15)
BUN: 80 mg/dL — ABNORMAL HIGH (ref 8–23)
CO2: 27 mmol/L (ref 22–32)
Calcium: 10.5 mg/dL — ABNORMAL HIGH (ref 8.9–10.3)
Chloride: 94 mmol/L — ABNORMAL LOW (ref 98–111)
Creatinine, Ser: 2.04 mg/dL — ABNORMAL HIGH (ref 0.44–1.00)
GFR, Estimated: 22 mL/min — ABNORMAL LOW (ref >60)
Glucose, Bld: 315 mg/dL — ABNORMAL HIGH (ref 70–99)
Potassium: 3.7 mmol/L (ref 3.5–5.1)
Sodium: 134 mmol/L — ABNORMAL LOW (ref 135–145)

## 2021-03-31 LAB — GLUCOSE, CAPILLARY
Glucose-Capillary: 293 mg/dL — ABNORMAL HIGH (ref 70–99)
Glucose-Capillary: 302 mg/dL — ABNORMAL HIGH (ref 70–99)
Glucose-Capillary: 329 mg/dL — ABNORMAL HIGH (ref 70–99)
Glucose-Capillary: 245 mg/dL — ABNORMAL HIGH (ref 70–99)

## 2021-03-31 MED ORDER — ORAL CARE MOUTH RINSE
15.0000 mL | Freq: Two times a day (BID) | OROMUCOSAL | Status: DC
  Administered 2021-03-31 – 2021-04-02 (×5): 15 mL via OROMUCOSAL

## 2021-03-31 MED ORDER — LIP MEDEX EX OINT
TOPICAL_OINTMENT | CUTANEOUS | Status: DC | PRN
  Administered 2021-03-31: 75 via TOPICAL
  Filled 2021-03-31: qty 7

## 2021-03-31 MED ORDER — DILTIAZEM HCL 60 MG PO TABS
60.0000 mg | ORAL_TABLET | Freq: Three times a day (TID) | ORAL | Status: DC
  Filled 2021-03-31 (×4): qty 1

## 2021-03-31 MED ORDER — CHLORHEXIDINE GLUCONATE 0.12 % MT SOLN
15.0000 mL | Freq: Two times a day (BID) | OROMUCOSAL | Status: DC
  Administered 2021-03-31 – 2021-04-02 (×6): 15 mL via OROMUCOSAL
  Filled 2021-03-31 (×5): qty 15

## 2021-03-31 NOTE — Progress Notes (Signed)
°   03/31/21 0102  Vitals  Temp 97.8 F (36.6 C)  Temp Source Oral  BP (!) 150/53  MAP (mmHg) 76  BP Location Right Arm  BP Method Automatic  Patient Position (if appropriate) Lying  Pulse Rate (!) 110  Pulse Rate Source Monitor  Resp (!) 44  Level of Consciousness  Level of Consciousness Alert  MEWS COLOR  MEWS Score Color Red  Oxygen Therapy  SpO2 99 %  O2 Device Nasal Cannula  O2 Flow Rate (L/min) 1 L/min  Pain Assessment  Pain Scale 0-10  Pain Score 0  PAINAD (Pain Assessment in Advanced Dementia)  Breathing 2  Negative Vocalization 0  Facial Expression 0  Body Language 0  Consolability 0  PAINAD Score 2  Glasgow Coma Scale  Eye Opening 4  Best Verbal Response (NON-intubated) 5  Best Motor Response 6  Glasgow Coma Scale Score 15  MEWS Score  MEWS Temp 0  MEWS Systolic 0  MEWS Pulse 1  MEWS RR 3  MEWS LOC 0  MEWS Score 4   RED MEWS triggered, pt converted from NSR to Afib and went into fib flutter per telemetry. Charge nurse & AC on the unit and came to bedside, Hospitalist notified

## 2021-03-31 NOTE — Progress Notes (Signed)
PROGRESS NOTE  Sabrina [name] WJX:[id number] DOB: 1926-10-14 DOA: 03/29/2021 PCP: [name], MD  Brief History   Sabrina [name] is a pleasant 88 y.o. female with medical history significant for COPD, chronic diastolic CHF, chronic hypoxic respiratory failure, CKD 4, history of DVT and PAF on Eliquis, hypertension, and anemia, now presenting with increased work of breathing and productive cough.  She is accompanied by a caregiver who assists with the history.  The patient has caregivers at her house around-the-clock and was noted to have labored breathing today.  She has also reportedly been producing copious amounts of sputum this evening.  Patient was not complaining of anything.  At baseline, she is talkative but nonambulatory and requires assistance with eating and dressing.  She takes a soft diet and has been taking her pills in applesauce.   ED Course: Upon arrival to the ED, patient is found to be afebrile, saturating mid to upper 90s on 3 L/min of supplemental oxygen, tachypneic, and with labile blood pressures.  EKG features sinus rhythm and chest x-ray notable for stable small right pleural effusion and minimal right basilar atelectasis or airspace disease.  Chemistry panel features a glucose 313, BUN 87, and creatinine 2.14.  CBC notable for WBC 11,100 and hemoglobin 7.8.  BNP is 248 and lactic acid normal.  COVID and influenza negative.  Blood cultures were collected in the ED and the patient was given 5 units of NovoLog, cefepime, and vancomycin.  At home the patient is on 2L O2, although her PCP has told her that she doesn't need O2. Pt is very somnolent on my visit. She was on 3L O2 and saturating 100%. I was concerned that the patient could be somnolent due to hypercarbia due to retention related to high SaO2. Her O2 was stopped. Nursing and caregiver at the patient's bedside were advised.  However again this morning the patient is somnolent, although she will open eyes to  voice. She is again on 2L O2. On 03/30/2021 an order was placed to keep her oxygen saturations between 88 and 94% as she is retaining CO2. I have discussed this with the different caregiver at her bedside today and with the patient's nurse.  Consultants  None  Procedures  None  Antibiotics   Anti-infectives (From admission, onward)    Start     Dose/Rate Route Frequency Ordered Stop   03/30/21 0145  cefTRIAXone (ROCEPHIN) 2 g in sodium chloride 0.9 % 100 mL IVPB        2 g 200 mL/hr over 30 Minutes Intravenous Every 24 hours 03/30/21 0133 04/03/21 2159   03/30/21 0145  azithromycin (ZITHROMAX) 500 mg in sodium chloride 0.9 % 250 mL IVPB        500 mg 250 mL/hr over 60 Minutes Intravenous Every 24 hours 03/30/21 0133 04/03/21 2159   03/29/21 2215  vancomycin (VANCOCIN) IVPB 1000 mg/200 mL premix        1,000 mg 200 mL/hr over 60 Minutes Intravenous  Once 03/29/21 2200 03/30/21 0018   03/29/21 2200  ceFEPIme (MAXIPIME) 2 g in sodium chloride 0.9 % 100 mL IVPB        2 g 200 mL/hr over 30 Minutes Intravenous  Once 03/29/21 2152 03/29/21 2310      Subjective  The patient is resting comfortably. No new complaints.  Objective   Vitals:  Vitals:   03/31/21 0924 03/31/21 1320  BP:  (!) 145/48  Pulse:  97  Resp:  16  Temp:  98 F (36.7 C)  SpO2: 100% 93%    Exam:  Constitutional:  The patient is somnolent, however she does open her eyes and respond verbally to voice. No acute distress. Eyes:  pupils and irises appear normal Normal lids and conjunctivae ENMT:  grossly normal hearing  Lips appear normal external ears, nose appear normal Oropharynx: mucosa, tongue,posterior pharynx appear normal Neck:  neck appears normal, no masses, normal ROM, supple no thyromegaly Respiratory:  CTA bilaterally, no w/r/r.  Respiratory effort normal. No retractions or accessory muscle use Cardiovascular:  RRR, no m/r/g No LE extremity edema   Normal pedal pulses Abdomen:   Abdomen appears normal; no tenderness or masses No hernias No HSM Musculoskeletal:  Digits/nails BUE: no clubbing, cyanosis, petechiae, infection exam of joints, bones, muscles of at least one of following: head/neck, RUE, LUE, RLE, LLE   strength and tone normal, no atrophy, no abnormal movements No tenderness, masses Normal ROM, no contractures  gait and station Skin:  No rashes, lesions, ulcers palpation of skin: no induration or nodules Neurologic:  Moving all extremities Psychiatric:  Somnolent.   I have personally reviewed the following:   Today's Data  Vitals  Lab Data  CBC CMP  Micro Data  Blood cultures x 2: No growth  Imaging  CTA chest  Cardiology Data  EKG  Other Data    Scheduled Meds:  amLODipine  2.5 mg Oral Daily   apixaban  2.5 mg Oral BID   chlorhexidine  15 mL Mouth Rinse BID   Chlorhexidine Gluconate Cloth  6 each Topical Daily   cloNIDine  0.2 mg Oral BID   diltiazem  60 mg Oral Q8H   diltiazem  60 mg Oral Q8H   furosemide  40 mg Intravenous BID   hydrALAZINE  50 mg Oral TID   insulin aspart  0-5 Units Subcutaneous QHS   insulin aspart  0-9 Units Subcutaneous TID WC   isosorbide mononitrate  30 mg Oral Daily   mouth rinse  15 mL Mouth Rinse q12n4p   mometasone-formoterol  2 puff Inhalation BID   sodium chloride flush  3 mL Intravenous Q12H   Continuous Infusions:  azithromycin 500 mg (03/31/21 0203)   cefTRIAXone (ROCEPHIN)  IV 2 g (03/31/21 0122)    Principal Problem:   Acute and chronic respiratory failure with hypoxia (HCC) Active Problems:   Chronic diastolic congestive heart failure (HCC)   CKD (chronic kidney disease), stage IV (HCC)   Type 2 diabetes mellitus with stage 4 chronic kidney disease (HCC)   Personal history of DVT and pulm embolus   Paroxysmal atrial fibrillation (HCC)   Personal history of other venous thrombosis and embolism   Normocytic anemia   COPD (chronic obstructive pulmonary disease) ([place])    Pneumonia   LOS: 1 day    A & P  1. Pneumonia  - Presents with labored respirations and productive cough concerning for PNA  - She was started on vanc and cefepime in ED  - Check sputum culture, check strep pneumo and legionella antigens, treat with Rocephin and azithromycin for now, trend procalcitonin and follow cultures and clinical course    - Repeat CT chest demonstrates near resolution of prior appearance of nodular diffuse ground glass changes. There is continued appearance of a right lower lobe effusion that results in volume loss and possible consolidation of the right lung base. Procalcitonin is 0.75. Will continue IV antibiotics.   2. COPD; chronic hypoxic respiratory failure  - No wheezing on admission -  Continue ICS/LABA and as-needed SABA   - The patient is saturating 91-94 % on room air. - Avoid SaO2 greater than 94% to avoid CO2 retention.   3. HFpEF  - Appears compensated  - EF was 60-65% earlier this month  - Continue Lasix, monitor volume status     4. PAF  - In SR on admission  - Continue Eliquis, diltiazem     5. Type II DM  - A1c was 8.8% in January 2023  - Serum glucose 313 in ED  - Check CBGs and use SSI for now     6. HTN  - Pressures labile in ED, cautiously continue home regimen     7. CKD IV  - SCr is 2.14 on admission, appears close to baseline  - Renally-dose medications, monitor    8. Anemia  - Appears stable, no overt bleeding, likely d/t CKD. Hemoglobin is 7.4 this am. Monitor and transfuse for hemoglobin less than 7.0 gm.  I have seen and examined this patient myself. I have spent 32 minutes in her evaluation and care.     DVT prophylaxis: Eliquis  Code Status: DNR, confirmed on admission  Level of Care: Level of care: Telemetry Family Communication: Caregiver at bedside  Disposition Plan:  Patient is from: Home  Anticipated d/c is to: Home    Ludene Stokke, DO [facility] Direct contact: see [email or web address]  7PM-7AM contact  night coverage as above 03/31/2021, 4:31 PM  LOS: 0 days

## 2021-03-31 NOTE — Progress Notes (Signed)
°  Transition of Care [facility]) Screening Note   Patient Details  Name: Sabrina [name] Date of Birth: 07/20/1926   Transition of Care [facility]) CM/SW Contact:    Lennart Pall, LCSW Phone Number: 03/31/2021, 11:50 AM    Transition of Care Department [facility]) has reviewed patient and no TOC needs have been identified at this time. We will continue to monitor patient advancement through interdisciplinary progression rounds. If new patient transition needs arise, please place a TOC consult.

## 2021-03-31 NOTE — Progress Notes (Signed)
Pt on RED MEWS on writer arrival to unit. At this time, pt is GREEN MEWS. Sleeping in bed. No s/s of distress noted. Personal caregiver at bedside.  MD aware of pt condition.

## 2021-03-31 NOTE — Progress Notes (Signed)
Pt in A Fib RVR, HR in 140s, saved on telemetry.  Pt is AOx4, no c/o pain.  Pt is sleeping but arousable.    Rapid Response RN Genell notified and On call Jeannette Corpus NP notified.  On call did respond and plan is to observe since Pt is asymptomatic.  RN will be following RED MEWS protocol and assessing frequently.

## 2021-04-01 LAB — GLUCOSE, CAPILLARY
Glucose-Capillary: 242 mg/dL — ABNORMAL HIGH (ref 70–99)
Glucose-Capillary: 264 mg/dL — ABNORMAL HIGH (ref 70–99)
Glucose-Capillary: 269 mg/dL — ABNORMAL HIGH (ref 70–99)
Glucose-Capillary: 283 mg/dL — ABNORMAL HIGH (ref 70–99)

## 2021-04-01 LAB — BASIC METABOLIC PANEL
Anion gap: 8 (ref 5–15)
BUN: 75 mg/dL — ABNORMAL HIGH (ref 8–23)
CO2: 32 mmol/L (ref 22–32)
Calcium: 10.4 mg/dL — ABNORMAL HIGH (ref 8.9–10.3)
Chloride: 94 mmol/L — ABNORMAL LOW (ref 98–111)
Creatinine, Ser: 2.03 mg/dL — ABNORMAL HIGH (ref 0.44–1.00)
GFR, Estimated: 23 mL/min — ABNORMAL LOW (ref >60)
Glucose, Bld: 237 mg/dL — ABNORMAL HIGH (ref 70–99)
Potassium: 3.4 mmol/L — ABNORMAL LOW (ref 3.5–5.1)
Sodium: 134 mmol/L — ABNORMAL LOW (ref 135–145)

## 2021-04-01 LAB — CBC WITH DIFFERENTIAL/PLATELET
Abs Immature Granulocytes: 0.03 10*3/uL (ref 0.00–0.07)
Basophils Absolute: 0 10*3/uL (ref 0.0–0.1)
Basophils Relative: 0 %
Eosinophils Absolute: 0.4 10*3/uL (ref 0.0–0.5)
Eosinophils Relative: 4 %
HCT: 20.9 % — ABNORMAL LOW (ref 36.0–46.0)
Hemoglobin: 6.7 g/dL — CL (ref 12.0–15.0)
Immature Granulocytes: 0 %
Lymphocytes Relative: 31 %
Lymphs Abs: 2.7 10*3/uL (ref 0.7–4.0)
MCH: 31.9 pg (ref 26.0–34.0)
MCHC: 32.1 g/dL (ref 30.0–36.0)
MCV: 99.5 fL (ref 80.0–100.0)
Monocytes Absolute: 0.7 10*3/uL (ref 0.1–1.0)
Monocytes Relative: 8 %
Neutro Abs: 4.9 10*3/uL (ref 1.7–7.7)
Neutrophils Relative %: 57 %
Platelets: 268 10*3/uL (ref 150–400)
RBC: 2.1 MIL/uL — ABNORMAL LOW (ref 3.87–5.11)
RDW: 14.8 % (ref 11.5–15.5)
WBC: 8.7 10*3/uL (ref 4.0–10.5)
nRBC: 0 % (ref 0.0–0.2)

## 2021-04-01 LAB — HEMOGLOBIN AND HEMATOCRIT, BLOOD
HCT: 25.7 % — ABNORMAL LOW (ref 36.0–46.0)
Hemoglobin: 8.4 g/dL — ABNORMAL LOW (ref 12.0–15.0)

## 2021-04-01 LAB — PREPARE RBC (CROSSMATCH)

## 2021-04-01 MED ORDER — SODIUM CHLORIDE 0.9% IV SOLUTION: Freq: Once | INTRAVENOUS | Status: AC

## 2021-04-01 MED ORDER — FUROSEMIDE 10 MG/ML IJ SOLN
20.0000 mg | Freq: Every day | INTRAMUSCULAR | Status: DC
  Administered 2021-04-02: 20 mg via INTRAVENOUS
  Filled 2021-04-01: qty 2

## 2021-04-01 MED ORDER — CLONIDINE HCL 0.1 MG PO TABS
0.1000 mg | ORAL_TABLET | Freq: Two times a day (BID) | ORAL | Status: DC
  Administered 2021-04-01 – 2021-04-02 (×2): 0.1 mg via ORAL
  Filled 2021-04-01 (×2): qty 1

## 2021-04-01 MED ORDER — HYDRALAZINE HCL 50 MG PO TABS
50.0000 mg | ORAL_TABLET | Freq: Two times a day (BID) | ORAL | Status: DC
  Administered 2021-04-01 – 2021-04-02 (×2): 50 mg via ORAL
  Filled 2021-04-01 (×3): qty 1

## 2021-04-01 NOTE — Progress Notes (Signed)
PROGRESS NOTE    Sabrina [name]  NWG:[id number] DOB: [birth year] DOA: 03/29/2021 PCP: [name], MD   Brief Narrative: Sabrina [name] is a pleasant 88 y.o. female with medical history significant for COPD, chronic diastolic CHF, chronic hypoxic respiratory failure, CKD 4, history of DVT and PAF on Eliquis, hypertension, and anemia, now presenting with increased work of breathing and productive cough.  She is accompanied by a caregiver who assists with the history.  The patient has caregivers at her house around-the-clock and was noted to have labored breathing today.  She has also reportedly been producing copious amounts of sputum this evening.  Patient was not complaining of anything.  At baseline, she is talkative but nonambulatory and requires assistance with eating and dressing.  She takes a soft diet and has been taking her pills in applesauce.   ED Course: Upon arrival to the ED, patient is found to be afebrile, saturating mid to upper 90s on 3 L/min of supplemental oxygen, tachypneic, and with labile blood pressures.  EKG features sinus rhythm and chest x-ray notable for stable small right pleural effusion and minimal right basilar atelectasis or airspace disease.  Chemistry panel features a glucose 313, BUN 87, and creatinine 2.14.  CBC notable for WBC 11,100 and hemoglobin 7.8.  BNP is 248 and lactic acid normal.  COVID and influenza negative.  Blood cultures were collected in the ED and the patient was given 5 units of NovoLog, cefepime, and vancomycin.   At home the patient is on 2L O2, although her PCP has told her that she doesn't need O2. Pt is very somnolent on my visit. She was on 3L O2 and saturating 100%. I was concerned that the patient could be somnolent due to hypercarbia due to retention related to high SaO2. Her O2 was stopped. Nursing and caregiver at the patient's bedside were advised.   However again this morning the patient is somnolent, although she will open eyes  to voice. She is again on 2L O2. On 03/30/2021 an order was placed to keep her oxygen saturations between 88 and 94% as she is retaining CO2. I have discussed this with the different caregiver at her bedside today and with the patient's nurse.  Assessment & Plan:   Principal Problem:   Acute and chronic respiratory failure with hypoxia (HCC) Active Problems:   Chronic diastolic congestive heart failure (HCC)   CKD (chronic kidney disease), stage IV (HCC)   Type 2 diabetes mellitus with stage 4 chronic kidney disease (HCC)   Personal history of DVT and pulm embolus   Paroxysmal atrial fibrillation (HCC)   Personal history of other venous thrombosis and embolism   Normocytic anemia   COPD (chronic obstructive pulmonary disease) (HCC)   Pneumonia   #1 community-acquired pneumonia in frail elderly female on Rocephin and azithromycin. Chest CT near resolution of prior appearance of nodular diffuse groundglass changes.  Right lower lobe effusion and volume loss and possible consolidation of the right lung base. Patient is on 2 L of oxygen saturating 91%.  #2 history of congestive heart failure diastolic with preserved ejection fraction on Lasix.  Ejection fraction 60 to 65% this month. She appears dry.  Decrease Lasix to 20 mg daily.  #3 paroxysmal atrial fibrillation continue diltiazem for rate control hold Eliquis due to anemia and blood transfusion.  Rate controlled at 86.  #4 history of chronic COPD and chronic hypoxic respiratory failure on oxygen continue neb treatments  #5 type 2 diabetes continue SSI  CBG (last 3)  Recent Labs    03/31/21 1643 03/31/21 2037 04/01/21 0723  GLUCAP 329* 245* 242*     #6 history of essential hypertension blood pressure soft 129/47 DC Norvasc Decrease Catapres to 0.1 every 12 On Cardizem 60 mg every 8 hours Lasix 40 mg twice a day Hydralazine 50 mg 3 times a day Imdur 30 mg daily Will need to titrate these medications for soft BP.  #7 CKD  stage IV creatinine 2.03 at baseline  #8 anemia of chronic disease hemoglobin down to 6.7 from 7.4.  Transfuse 1 unit of packed RBC today.  No evidence of active bleeding.  Hold Eliquis.  #9 goals of care patient is DNR.  With multiple comorbidities and low functional status(patient bedbound) I will consult palliative care.  Pressure Injury 03/30/21 Sacrum Stage 2 -  Partial thickness loss of dermis presenting as a shallow open injury with a red, pink wound bed without slough. (Active)  03/30/21 1545  Location: Sacrum  Location Orientation:   Staging: Stage 2 -  Partial thickness loss of dermis presenting as a shallow open injury with a red, pink wound bed without slough.  Wound Description (Comments):   Present on Admission: Yes     Estimated body mass index is 32.33 kg/m as calculated from the following:   Height as of this encounter: 4\' 11"  (1.499 m).   Weight as of this encounter: 72.6 kg.  DVT prophylaxis:none eliquis stopped  Code Status: dnr Family Communication: none  Disposition Plan:  Status is: Inpatient  Remains inpatient appropriate because: anemia    Consultants: none  Procedures: none Antimicrobials:  Anti-infectives (From admission, onward)    Start     Dose/Rate Route Frequency Ordered Stop   03/30/21 0145  cefTRIAXone (ROCEPHIN) 2 g in sodium chloride 0.9 % 100 mL IVPB        2 g 200 mL/hr over 30 Minutes Intravenous Every 24 hours 03/30/21 0133 04/03/21 2159   03/30/21 0145  azithromycin (ZITHROMAX) 500 mg in sodium chloride 0.9 % 250 mL IVPB        500 mg 250 mL/hr over 60 Minutes Intravenous Every 24 hours 03/30/21 0133 04/03/21 2159   03/29/21 2215  vancomycin (VANCOCIN) IVPB 1000 mg/200 mL premix        1,000 mg 200 mL/hr over 60 Minutes Intravenous  Once 03/29/21 2200 03/30/21 0018   03/29/21 2200  ceFEPIme (MAXIPIME) 2 g in sodium chloride 0.9 % 100 mL IVPB        2 g 200 mL/hr over 30 Minutes Intravenous  Once 03/29/21 2152 03/29/21 2310         Subjective: Resting in bed.. Caregiver by the bedside Hemoglobin 6.7 No evidence of active bleeding On Eliquis Patient lives at home with 24-hour caregiver  Objective: Vitals:   03/31/21 1919 03/31/21 2030 04/01/21 0328 04/01/21 0500  BP:  (!) 145/49 (!) 129/47   Pulse:  88 80   Resp:  16 20   Temp:  98.4 F (36.9 C) 98.7 F (37.1 C)   TempSrc:  Oral Oral   SpO2: 93% 98% 91%   Weight:    72.6 kg  Height:        Intake/Output Summary (Last 24 hours) at 04/01/2021 1011 Last data filed at 04/01/2021 0900 Gross per 24 hour  Intake 180 ml  Output 900 ml  Net -720 ml   Filed Weights   03/30/21 0021 03/31/21 0534 04/01/21 0500  Weight: 75 kg 71.7 kg 72.6  kg    Examination:  General exam: Appears chronically ill looking frail and elderly female respiratory system: Diminished breath sounds at the bases.  To auscultation. Respiratory effort normal. Cardiovascular system: S1 & S2 heard, RRR. No JVD, murmurs, rubs, gallops or clicks. No pedal edema. Gastrointestinal system: Abdomen is nondistended, soft and nontender. No organomegaly or masses felt. Normal bowel sounds heard. Central nervous system: Does not respond to questions or commands Extremities: Symmetric 5 x 5 power. Skin: No rashes, lesions or ulcers Psychiatry: Unable to assess   Data Reviewed: I have personally reviewed following labs and imaging studies  CBC: Recent Labs  Lab 03/29/21 1959 03/30/21 0954 03/31/21 0537 04/01/21 0517  WBC 11.1* 9.3 12.2* 8.7  NEUTROABS 7.6 6.0 10.3* 4.9  HGB 7.8* 7.4* 7.7* 6.7*  HCT 23.6* 22.6* 24.5* 20.9*  MCV 97.5 98.3 101.2* 99.5  PLT 239 234 280 295   Basic Metabolic Panel: Recent Labs  Lab 03/29/21 1959 03/30/21 0344 03/31/21 0537 04/01/21 0517  NA 128* 129* 134* 134*  K 3.8 3.9 3.7 3.4*  CL 90* 92* 94* 94*  CO2 27 29 27  32  GLUCOSE 313* 315* 315* 237*  BUN 87* 83* 80* 75*  CREATININE 2.14* 2.17* 2.04* 2.03*  CALCIUM 10.7* 9.8 10.5* 10.4*  MG  --  2.0   --   --    GFR: Estimated Creatinine Clearance: 15.4 mL/min (A) (by C-G formula based on SCr of 2.03 mg/dL (H)). Liver Function Tests: Recent Labs  Lab 03/29/21 1959  AST 15  ALT 16  ALKPHOS 80  BILITOT 0.7  PROT 6.6  ALBUMIN 3.2*   No results for input(s): LIPASE, AMYLASE in the last 168 hours. No results for input(s): AMMONIA in the last 168 hours. Coagulation Profile: No results for input(s): INR, PROTIME in the last 168 hours. Cardiac Enzymes: No results for input(s): CKTOTAL, CKMB, CKMBINDEX, TROPONINI in the last 168 hours. BNP (last 3 results) No results for input(s): PROBNP in the last 8760 hours. HbA1C: No results for input(s): HGBA1C in the last 72 hours. CBG: Recent Labs  Lab 03/31/21 0857 03/31/21 1202 03/31/21 1643 03/31/21 2037 04/01/21 0723  GLUCAP 293* 302* 329* 245* 242*   Lipid Profile: No results for input(s): CHOL, HDL, LDLCALC, TRIG, CHOLHDL, LDLDIRECT in the last 72 hours. Thyroid Function Tests: No results for input(s): TSH, T4TOTAL, FREET4, T3FREE, THYROIDAB in the last 72 hours. Anemia Panel: No results for input(s): VITAMINB12, FOLATE, FERRITIN, TIBC, IRON, RETICCTPCT in the last 72 hours. Sepsis Labs: Recent Labs  Lab 03/29/21 1954 03/30/21 0344  PROCALCITON  --  0.75  LATICACIDVEN 0.7  --     Recent Results (from the past 240 hour(s))  Culture, blood (single)     Status: None (Preliminary result)   Collection Time: 03/29/21  7:59 PM   Specimen: BLOOD LEFT WRIST  Result Value Ref Range Status   Specimen Description   Final    BLOOD LEFT WRIST Performed at [facility] [address] [place], [place] [address]    Special Requests   Final    BOTTLES DRAWN AEROBIC AND ANAEROBIC Blood Culture adequate volume Performed at [facility] [address], [place], [place] [address]    Culture   Final    NO GROWTH 3 DAYS Performed at [facility], [place] [address], [place], [place]  [address]    Report Status PENDING  Incomplete  Resp Panel by RT-PCR (Flu A&B, Covid) Nasopharyngeal Swab  Status: None   Collection Time: 03/29/21  8:00 PM   Specimen: Nasopharyngeal Swab; Nasopharyngeal(NP) swabs in vial transport medium  Result Value Ref Range Status   SARS Coronavirus 2 by RT PCR NEGATIVE NEGATIVE Final    Comment: (NOTE) SARS-CoV-2 target nucleic acids are NOT DETECTED.  The SARS-CoV-2 RNA is generally detectable in upper respiratory specimens during the acute phase of infection. The lowest concentration of SARS-CoV-2 viral copies this assay can detect is 138 copies/mL. A negative result does not preclude SARS-Cov-2 infection and should not be used as the sole basis for treatment or other patient management decisions. A negative result may occur with  improper specimen collection/handling, submission of specimen other than nasopharyngeal swab, presence of viral mutation(s) within the areas targeted by this assay, and inadequate number of viral copies(<138 copies/mL). A negative result must be combined with clinical observations, patient history, and epidemiological information. The expected result is Negative.  Fact Sheet for Patients:  EntrepreneurPulse.com.au  Fact Sheet for Healthcare Providers:  IncredibleEmployment.be  This test is no t yet approved or cleared by the [place] FDA and  has been authorized for detection and/or diagnosis of SARS-CoV-2 by FDA under an Emergency Use Authorization (EUA). This EUA will remain  in effect (meaning this test can be used) for the duration of the COVID-19 declaration under Section 564(b)(1) of the Act, 21 U.S.C.section 360bbb-3(b)(1), unless the authorization is terminated  or revoked sooner.       Influenza A by PCR NEGATIVE NEGATIVE Final   Influenza B by PCR NEGATIVE NEGATIVE Final    Comment: (NOTE) The Xpert Xpress SARS-CoV-2/FLU/RSV plus assay is intended as an  aid in the diagnosis of influenza from Nasopharyngeal swab specimens and should not be used as a sole basis for treatment. Nasal washings and aspirates are unacceptable for Xpert Xpress SARS-CoV-2/FLU/RSV testing.  Fact Sheet for Patients: EntrepreneurPulse.com.au  Fact Sheet for Healthcare Providers: IncredibleEmployment.be  This test is not yet approved or cleared by the [place] FDA and has been authorized for detection and/or diagnosis of SARS-CoV-2 by FDA under an Emergency Use Authorization (EUA). This EUA will remain in effect (meaning this test can be used) for the duration of the COVID-19 declaration under Section 564(b)(1) of the Act, 21 U.S.C. section 360bbb-3(b)(1), unless the authorization is terminated or revoked.  Performed at [facility], [place] [address], [place], [place] [address]          Radiology Studies: CT CHEST WO CONTRAST  Result Date: 03/30/2021 CLINICAL DATA:  Suspected pneumonia in a 88 year old female. EXAM: CT CHEST WITHOUT CONTRAST TECHNIQUE: Multidetector CT imaging of the chest was performed following the standard protocol without IV contrast. RADIATION DOSE REDUCTION: This exam was performed according to the departmental dose-optimization program which includes automated exposure control, adjustment of the mA and/or kV according to patient size and/or use of iterative reconstruction technique. COMPARISON:  Comparison made with March 14, 2021. FINDINGS: Cardiovascular: Dense mitral annular and valvular calcifications. Heart size stable without pericardial effusion. Aortic caliber is normal with scattered atherosclerotic calcifications. Central pulmonary vasculature is normal caliber. Limited assessment of cardiovascular structures given lack of intravenous contrast. Mediastinum/Nodes: Mild fullness of subcarinal nodal tissue. No frank adenopathy in the chest. Esophagus is grossly normal.  Lungs/Pleura: Post radiation changes along the anterolateral RIGHT upper lobe along with RIGHT apical scarring with similar appearance. Diffuse nodular ground-glass changes have improved considerably since previous imaging. These are nearly completely resolved. There is persistent RIGHT-sided pleural fluid which is of  similar volume compared to the prior study and is associated with volume loss at the RIGHT lung base. Airways are patent. Upper Abdomen: Cholelithiasis. Imaged portions of liver, gallbladder, pancreas, spleen, adrenal glands and of the kidneys are unremarkable. No acute upper abdominal findings to the extent evaluated. Musculoskeletal: Spinal degenerative changes. No acute or destructive bone finding. IMPRESSION: 1. Diffuse nodular ground-glass changes have improved considerably since the prior imaging. These are nearly completely resolved. 2. There is persistent RIGHT-sided pleural fluid which is of similar volume compared to the prior study and is associated with volume loss/consolidation at the RIGHT lung base. 3. Post radiation changes along the anterolateral RIGHT upper lobe along with RIGHT apical scarring with similar appearance. 4. Cholelithiasis. 5. Aortic atherosclerosis. Aortic Atherosclerosis (ICD10-I70.0). Electronically Signed   By: Zetta Bills M.D.   On: 03/30/2021 11:37        Scheduled Meds:  sodium chloride   Intravenous Once   amLODipine  2.5 mg Oral Daily   chlorhexidine  15 mL Mouth Rinse BID   Chlorhexidine Gluconate Cloth  6 each Topical Daily   cloNIDine  0.2 mg Oral BID   diltiazem  60 mg Oral Q8H   furosemide  40 mg Intravenous BID   hydrALAZINE  50 mg Oral TID   insulin aspart  0-5 Units Subcutaneous QHS   insulin aspart  0-9 Units Subcutaneous TID WC   isosorbide mononitrate  30 mg Oral Daily   mouth rinse  15 mL Mouth Rinse q12n4p   mometasone-formoterol  2 puff Inhalation BID   sodium chloride flush  3 mL Intravenous Q12H   Continuous Infusions:   azithromycin 500 mg (03/31/21 2250)   cefTRIAXone (ROCEPHIN)  IV 2 g (03/31/21 2150)     LOS: 2 days   Georgette Shell, MD  04/01/2021, 10:11 AM

## 2021-04-02 LAB — GLUCOSE, CAPILLARY
Glucose-Capillary: 217 mg/dL — ABNORMAL HIGH (ref 70–99)
Glucose-Capillary: 327 mg/dL — ABNORMAL HIGH (ref 70–99)

## 2021-04-02 LAB — CBC
HCT: 26.1 % — ABNORMAL LOW (ref 36.0–46.0)
Hemoglobin: 8.4 g/dL — ABNORMAL LOW (ref 12.0–15.0)
MCH: 31.2 pg (ref 26.0–34.0)
MCHC: 32.2 g/dL (ref 30.0–36.0)
MCV: 97 fL (ref 80.0–100.0)
Platelets: 285 10*3/uL (ref 150–400)
RBC: 2.69 MIL/uL — ABNORMAL LOW (ref 3.87–5.11)
RDW: 16.2 % — ABNORMAL HIGH (ref 11.5–15.5)
WBC: 8.7 10*3/uL (ref 4.0–10.5)
nRBC: 0 % (ref 0.0–0.2)

## 2021-04-02 LAB — COMPREHENSIVE METABOLIC PANEL
ALT: 12 U/L (ref 0–44)
AST: 11 U/L — ABNORMAL LOW (ref 15–41)
Albumin: 2.7 g/dL — ABNORMAL LOW (ref 3.5–5.0)
Alkaline Phosphatase: 62 U/L (ref 38–126)
Anion gap: 9 (ref 5–15)
BUN: 64 mg/dL — ABNORMAL HIGH (ref 8–23)
CO2: 31 mmol/L (ref 22–32)
Calcium: 10.6 mg/dL — ABNORMAL HIGH (ref 8.9–10.3)
Chloride: 96 mmol/L — ABNORMAL LOW (ref 98–111)
Creatinine, Ser: 1.74 mg/dL — ABNORMAL HIGH (ref 0.44–1.00)
GFR, Estimated: 27 mL/min — ABNORMAL LOW (ref >60)
Glucose, Bld: 212 mg/dL — ABNORMAL HIGH (ref 70–99)
Potassium: 3.4 mmol/L — ABNORMAL LOW (ref 3.5–5.1)
Sodium: 136 mmol/L (ref 135–145)
Total Bilirubin: 0.7 mg/dL (ref 0.3–1.2)
Total Protein: 5.8 g/dL — ABNORMAL LOW (ref 6.5–8.1)

## 2021-04-02 LAB — TYPE AND SCREEN
ABO/RH(D): O NEG
Antibody Screen: NEGATIVE
Unit division: 0

## 2021-04-02 LAB — BPAM RBC
Blood Product Expiration Date: 202302182359
ISSUE DATE / TIME: 202301291048
Unit Type and Rh: 9500

## 2021-04-02 LAB — LEGIONELLA PNEUMOPHILA SEROGP 1 UR AG: L. pneumophila Serogp 1 Ur Ag: NEGATIVE

## 2021-04-02 LAB — OCCULT BLOOD X 1 CARD TO LAB, STOOL: Fecal Occult Bld: POSITIVE — AB

## 2021-04-02 MED ORDER — BISACODYL 10 MG RE SUPP
10.0000 mg | Freq: Once | RECTAL | Status: AC
  Administered 2021-04-02: 10 mg via RECTAL
  Filled 2021-04-02: qty 1

## 2021-04-02 MED ORDER — ONDANSETRON HCL 4 MG PO TABS: 4.0000 mg | ORAL_TABLET | Freq: Four times a day (QID) | ORAL | 0 refills | Status: DC | PRN

## 2021-04-02 MED ORDER — POTASSIUM CHLORIDE CRYS ER 20 MEQ PO TBCR
40.0000 meq | EXTENDED_RELEASE_TABLET | Freq: Once | ORAL | Status: AC
  Administered 2021-04-02: 40 meq via ORAL
  Filled 2021-04-02: qty 2

## 2021-04-02 MED ORDER — POLYETHYLENE GLYCOL 3350 17 G PO PACK
17.0000 g | PACK | Freq: Every day | ORAL | Status: DC
  Administered 2021-04-02: 17 g via ORAL
  Filled 2021-04-02: qty 1

## 2021-04-02 MED ORDER — GLIPIZIDE 5 MG PO TABS: 5.0000 mg | ORAL_TABLET | ORAL | 2 refills | Status: DC

## 2021-04-02 MED ORDER — AMOXICILLIN-POT CLAVULANATE 875-125 MG PO TABS: 1.0000 | ORAL_TABLET | Freq: Two times a day (BID) | ORAL | 0 refills | Status: AC

## 2021-04-02 NOTE — Progress Notes (Signed)
Patient saturations were 98% to 100% on 2lpm [place]. The nasal cannula was turned off to see how patient did on room air. Patient is 96% on room air and tolerating well at this time

## 2021-04-02 NOTE — Progress Notes (Signed)
AVS given and reviewed with pt, pt's nephew- Jimmy Maness, and pt's caregiver-[name], per nephew's request. Medications discussed. All questions answered to satisfaction. Pt's nephew and caregiver verbalized understanding of information given. Pt escorted off the unit with all belongings via PTAR.

## 2021-04-02 NOTE — Evaluation (Signed)
Clinical/Bedside Swallow Evaluation Patient Details  Name: Sabrina [name] MRN: [id number] Date of Birth: April 11, 1926  Today's Date: 04/02/2021 Time: SLP Start Time (ACUTE ONLY): 30 SLP Stop Time (ACUTE ONLY): 1100 SLP Time Calculation (min) (ACUTE ONLY): 20 min  Past Medical History:  Past Medical History:  Diagnosis Date   Arthritis    "mostly in my hands, lower back" (06/25/2017)   Basal cell carcinoma (BCC) of face 1983   Breast cancer, right breast ([facility]) 03/2013   Chronic kidney disease (CKD), stage III (moderate) ([place])    nephrologist, Dr. [name]   Dental crowns present    DVT (deep venous thrombosis) ([facility]) ~ 06/2013   "? side"   Gout    "on daily RX" (06/25/2017)   Heart murmur    no known problems; states did not know she had murmur until age 74   High cholesterol    Hypertension    fluctuates, especially when stressed; has been on med. > 20 yr.   Immature cataract    Non-insulin dependent type 2 diabetes mellitus ([place])    Personal history of chemotherapy    Personal history of radiation therapy    Pulmonary embolism ([facility]) ~ 06/2013   Radiation 09/06/13-10/20/13   Right Breast Cancer   Stroke ([facility]) 05/2017   just visual problems since (06/25/2017)   Wears partial dentures    lower   Past Surgical History:  Past Surgical History:  Procedure Laterality Date   AXILLARY LYMPH NODE DISSECTION Right 03/30/2013   Procedure: AXILLARY LYMPH NODE DISSECTION;  Surgeon: Rolm Bookbinder, MD;  Location: [facility];  Service: General;  Laterality: Right;   BASAL CELL CARCINOMA EXCISION  1983   "face"   BREAST BIOPSY Right 03/17/2013   BREAST CYST EXCISION Right 11/1958   benign   BREAST LUMPECTOMY Right 03/30/2013   BREAST LUMPECTOMY WITH NEEDLE LOCALIZATION Right 03/30/2013   Procedure: BREAST LUMPECTOMY WITH NEEDLE LOCALIZATION;  Surgeon: Rolm Bookbinder, MD;  Location: [facility];  Service: General;  Laterality: Right;   DILATION AND CURETTAGE OF UTERUS     LOOP  RECORDER INSERTION N/A 08/15/2017   Procedure: LOOP RECORDER INSERTION;  Surgeon: Evans Lance, MD;  Location: [facility];  Service: Cardiovascular;  Laterality: N/A;   PORT-A-CATH REMOVAL  2016   PORTACATH PLACEMENT N/A 04/15/2013   Procedure: INSERTION PORT-A-CATH;  Surgeon: Rolm Bookbinder, MD;  Location: [facility];  Service: General;  Laterality: N/A;   RE-EXCISION OF BREAST CANCER,SUPERIOR MARGINS Right 04/15/2013   Procedure: RE-EXCISION OF RIGHT BREAST  MARGINS;  Surgeon: Rolm Bookbinder, MD;  Location: [facility];  Service: General;  Laterality: Right;   TONSILLECTOMY  ~ 1935/1936   HPI:  Patient is a 88 y.o female with PMH: COPD, CHF, chronic hypoxic respiratory failure, HTN, anemia. She receives 24 hour caregiver assistance at home. She presented to hospital with increased WOB and productive cough with caregiver reporting production of copious amounts of sputum. In ED she was afebrile, oxygen saturations in upper 90's%, CXRnotable for stable small right pleural effusion and minimal right basilar atelectasis or airspace disease. She was recently admitted and evaluated at bedside for swallow function on 03/13/2021 by SLP.    Assessment / Plan / Recommendation  Clinical Impression  Patient presents with an oropharyngeal swallow that appears to be [facility] and likely at baseline. Of note, she was admitted and evaluated on 03/13/21 by SLP for bedside swallow evaluation and recommendation was for Dys 3 solids, thin liquids diet.  During today's assessment, SLP observed patient consume smashed banana and drink milk via straw sips. Her caregiver [name] fed patient and as she has been working with patient for past 4 years, she was able to give SLP information on her baseline swallow function and PO intake. She reported that patient has not been eating as much as normal for past 2-3 weeks and she has been more sleepy overall. No observed frequent coughing or throat  clearing, however. SLP is recommending to continue with current diet of Dys 3 solids, thin liquids but SLP provided caregiver with sample of nectar thick Simplythick gel for use at home as needed. No f/u SLP intervention recommended at this time. SLP Visit Diagnosis: Dysphagia, unspecified (R13.10)    Aspiration Risk  Mild aspiration risk    Diet Recommendation Dysphagia 3 (Mech soft);Thin liquid   Liquid Administration via: Cup;Straw Medication Administration: Whole meds with puree Supervision: Staff to assist with self feeding Compensations: Slow rate;Small sips/bites Postural Changes: Seated upright at 90 degrees;Remain upright for at least 30 minutes after po intake    Other  Recommendations Oral Care Recommendations: Oral care BID;Staff/trained caregiver to provide oral care    Recommendations for follow up therapy are one component of a multi-disciplinary discharge planning process, led by the attending physician.  Recommendations may be updated based on patient status, additional functional criteria and insurance authorization.  Follow up Recommendations No SLP follow up      Assistance Recommended at Discharge None  Functional Status Assessment Patient has had a recent decline in their functional status and demonstrates the ability to make significant improvements in function in a reasonable and predictable amount of time.  Frequency and Duration            Prognosis   N/A     Swallow Study   General Date of Onset: 03/30/21 HPI: Patient is a 88 y.o female with PMH: COPD, CHF, chronic hypoxic respiratory failure, HTN, anemia. She receives 24 hour caregiver assistance at home. She presented to hospital with increased WOB and productive cough with caregiver reporting production of copious amounts of sputum. In ED she was afebrile, oxygen saturations in upper 90's%, CXRnotable for stable small right pleural effusion and minimal right basilar atelectasis or airspace disease. She  was recently admitted and evaluated at bedside for swallow function on 03/13/2021 by SLP. Type of Study: Bedside Swallow Evaluation Previous Swallow Assessment: during recent previous admission Diet Prior to this Study: Dysphagia 3 (soft);Thin liquids Temperature Spikes Noted: No Respiratory Status: Room air History of Recent Intubation: No Behavior/Cognition: Alert;Cooperative;Pleasant mood;Lethargic/Drowsy Oral Cavity Assessment: Within Functional Limits Oral Care Completed by SLP: No Oral Cavity - Dentition: Adequate natural dentition Self-Feeding Abilities: Total assist Patient Positioning: Upright in bed Baseline Vocal Quality: Low vocal intensity Volitional Cough: Weak Volitional Swallow: Able to elicit    Oral/Motor/Sensory Function Overall Oral Motor/Sensory Function: Generalized oral weakness   Ice Chips     Thin Liquid Thin Liquid: Within functional limits Presentation: Straw    Nectar Thick     Honey Thick     Puree Puree: Within functional limits Presentation: [place], MA, CCC-SLP Speech Therapy

## 2021-04-02 NOTE — Progress Notes (Signed)
[facility] Hospital Liaison note: ° °This patient is currently enrolled in [facility] outpatient-based Palliative Care. Will continue to follow for disposition. ° °Please call with any outpatient palliative questions or concerns. ° °Thank you, °Dee Curry, LPN °[facility] Hospital Liaison °[phone] °

## 2021-04-02 NOTE — Progress Notes (Signed)
SLP Cancellation Note  Patient Details Name: Sabrina [name] MRN: [id number] DOB: [birth year]   Cancelled treatment:       Reason Eval/Treat Not Completed: Fatigue/lethargy limiting ability to participate. SLP came to see patient for bedside swallow eval this morning aroun 0820 but patient too sleepy to safely give PO's. Caregiver ([name]) was in room and provided some history on patient. Per [name], patient normally eats three meals a day but for past 2-3 weeks she has often started falling asleep with food in mouth and recently has had some grimacing when swallowing. SLP plans to return around 1030 this morning as that is when [name] feels she might be more alert.   Sonia Baller, MA, CCC-SLP Speech Therapy

## 2021-04-02 NOTE — Plan of Care (Signed)
°  Problem: Clinical Measurements: Goal: Ability to maintain a body temperature in the normal range will improve Outcome: Progressing   Problem: Education: Goal: Knowledge of General Education information will improve Description: Including pain rating scale, medication(s)/side effects and non-pharmacologic comfort measures Outcome: Progressing   Problem: Health Behavior/Discharge Planning: Goal: Ability to manage health-related needs will improve Outcome: Progressing   Problem: Nutrition: Goal: Adequate nutrition will be maintained Outcome: Progressing   Problem: Pain Managment: Goal: General experience of comfort will improve Outcome: Progressing   Problem: Safety: Goal: Ability to remain free from injury will improve Outcome: Progressing   Problem: Skin Integrity: Goal: Risk for impaired skin integrity will decrease Outcome: Progressing

## 2021-04-02 NOTE — Plan of Care (Signed)
Discussed with patient in front of caregiver plan of care, pain management and clustering care for rest with some teach back displayed  Problem: Activity: Goal: Ability to tolerate increased activity will improve Outcome: Progressing

## 2021-04-02 NOTE — Discharge Summary (Addendum)
Physician Discharge Summary  Acequia Bhola ZOX:[id number] DOB: 11/28/1926 DOA: 03/29/2021  PCP: [name], MD  Admit date: 03/29/2021 Discharge date: 04/02/2021  Admitted From: Home Disposition: Home Home  Recommendations for Outpatient Follow-up:  Follow up with PCP in 1-2 weeks Please obtain BMP/CBC in one week Palliative care to follow patient at home Home Health: None Equipment/Devices: None Discharge Condition: Stable CODE STATUS: DNR Diet recommendation: Soft regular Brief/Interim Summary:Hanadi Letty Salvi is a  88 y.o. female with medical history significant for COPD, chronic diastolic CHF, chronic hypoxic respiratory failure, CKD 4, history of DVT and PAF on Eliquis, hypertension, and anemia, now presenting with increased work of breathing and productive cough.  She is accompanied by a caregiver who assists with the history.  The patient has caregivers at her house around-the-clock and was noted to have labored breathing and producing copious amount of sputum.  At baseline patient is talkative and nonambulatory and requires assistance with eating and dressing.  She takes a soft diet at home.   She was COVID-negative and influenza negative.  She was treated with Rocephin and azithromycin.   Discharge Diagnoses:  Principal Problem:   Acute and chronic respiratory failure with hypoxia (HCC) Active Problems:   Chronic diastolic congestive heart failure (HCC)   CKD (chronic kidney disease), stage IV (HCC)   Type 2 diabetes mellitus with stage 4 chronic kidney disease (HCC)   Personal history of DVT and pulm embolus   Paroxysmal atrial fibrillation (HCC)   Personal history of other venous thrombosis and embolism   Normocytic anemia   COPD (chronic obstructive pulmonary disease) (HCC)   Pneumonia   #1 community-acquired pneumonia in frail elderly female-patient was treated with Rocephin and azithromycin while she was in hospital and discharged on Augmentin for 2 days to  finish a course of 5 days.  She was saturating on room air 97%.  Patient has as needed oxygen at home since her last hospital stay. Chest CT near resolution of prior appearance of nodular diffuse groundglass changes.  Right lower lobe effusion and volume loss and possible consolidation of the right lung base.    #2 history of congestive heart failure diastolic with preserved ejection fraction on Lasix.  Ejection fraction 60 to 65% this month. She appears dry.  Continue Lasix.  #3 paroxysmal atrial fibrillation continue diltiazem for rate control  Rate controlled at 86.  Discussed with her healthcare POA who is her niece about stopping Eliquis versus continuing.  They prefer Eliquis to be continued for now and will keep a close eye on her for any bleeding and will consider stopping Eliquis if she gets any further blood transfusion or have an acute bleed.   #4 history of chronic COPD and chronic hypoxic respiratory failure  Stable off of oxygen.  #5 type 2 diabetes continue glipizide 5 mg daily CBG (last 3)  Recent Labs (last 2 labs)        Recent Labs    03/31/21 1643 03/31/21 2037 04/01/21 0723  GLUCAP 329* 245* 242*       #6 history of essential hypertension blood pressure soft 129/47 Continue Cardizem Lasix Imdur and clonidine.   #7 CKD stage IV creatinine 2.03 at baseline   #8 anemia of chronic disease -her hemoglobin was 6.7 she received 1 unit of blood transfusion hemoglobin came up to 8.0 fecal occult blood testing is still pending.  Fecal occult blood testing from 02/20/2021 was negative.  Family/caregiver is going to keep a close eye on her  bowel habits and make sure that she has no bleeding in her bowels.  They will consider stopping anticoagulation if she has any further bleeding.    #9 goals of care patient is DNR.  With multiple comorbidities and low functional status(patient bedbound)  palliative care to follow patient at home.  #10 pressure injury to the sacrum stage  II present on admission continue wound care  Pressure Injury 03/30/21 Sacrum Stage 2 -  Partial thickness loss of dermis presenting as a shallow open injury with a red, pink wound bed without slough. (Active)  03/30/21 1545  Location: Sacrum  Location Orientation:   Staging: Stage 2 -  Partial thickness loss of dermis presenting as a shallow open injury with a red, pink wound bed without slough.  Wound Description (Comments):   Present on Admission: Yes    Estimated body mass index is 32.33 kg/m as calculated from the following:   Height as of this encounter: 4\' 11"  (1.499 m).   Weight as of this encounter: 72.6 kg.  Discharge Instructions  Discharge Instructions     Diet - low sodium heart healthy   Complete by: As directed    Discharge wound care:   Complete by: As directed    See orders   Increase activity slowly   Complete by: As directed       Allergies as of 04/02/2021       Reactions   Beta Adrenergic Blockers Other (See Comments)   Fatigue        Medication List     STOP taking these medications    hydrALAZINE 50 MG tablet Commonly known as: APRESOLINE   mometasone-formoterol 200-5 MCG/ACT Aero Commonly known as: DULERA       TAKE these medications    acetaminophen 500 MG tablet Commonly known as: TYLENOL Take 500 mg by mouth daily as needed for fever or moderate pain (for foot pain).   albuterol 108 (90 Base) MCG/ACT inhaler Commonly known as: VENTOLIN HFA Inhale 1-2 puffs into the lungs every 6 (six) hours as needed for wheezing or shortness of breath.   allopurinol 100 MG tablet Commonly known as: ZYLOPRIM Take 100 mg by mouth daily.   amoxicillin-clavulanate 875-125 MG tablet Commonly known as: Augmentin Take 1 tablet by mouth 2 (two) times daily for 2 days.   benzonatate 100 MG capsule Commonly known as: TESSALON Take 1 capsule (100 mg total) by mouth every 8 (eight) hours.   cholecalciferol 1000 units tablet Commonly known as:  VITAMIN D Take 1,000 Units by mouth daily.   cloNIDine 0.2 MG tablet Commonly known as: CATAPRES Take 0.2 mg by mouth 2 (two) times daily.   diltiazem 60 MG tablet Commonly known as: CARDIZEM Take 1 tablet (60 mg total) by mouth every 8 (eight) hours.   Eliquis 2.5 MG Tabs tablet Generic drug: apixaban TAKE 1 TABLET BY MOUTH TWICE DAILY. What changed: how much to take   famotidine 20 MG tablet Commonly known as: PEPCID Take 20 mg by mouth daily.   furosemide 40 MG tablet Commonly known as: LASIX Take 40 mg by mouth daily. Per Dr. If over 166lb take 80mg  a day   glipiZIDE 5 MG tablet Commonly known as: GLUCOTROL Take 1 tablet (5 mg total) by mouth See admin instructions. Takes 1 tab every am and 1/2 tab every evening What changed: how much to take   ipratropium-albuterol 0.5-2.5 (3) MG/3ML Soln Commonly known as: DUONEB Take 3 mLs by nebulization every 6 (six) hours  as needed (wheezing and shortness of breath).   isosorbide mononitrate 30 MG 24 hr tablet Commonly known as: IMDUR Take 1 tablet (30 mg total) by mouth daily.   nystatin 100000 UNIT/ML suspension Commonly known as: MYCOSTATIN Use as directed 5 mLs in the mouth or throat 2 (two) times daily.   ondansetron 4 MG tablet Commonly known as: ZOFRAN Take 1 tablet (4 mg total) by mouth every 6 (six) hours as needed for nausea.   polyethylene glycol 17 g packet Commonly known as: MIRALAX / GLYCOLAX Take 17 g by mouth daily.   senna-docusate 8.6-50 MG tablet Commonly known as: Senokot-S Take 1 tablet by mouth at bedtime as needed for mild constipation.   True Metrix Blood Glucose Test test strip Generic drug: glucose blood   TRUEplus Lancets 28G Misc Apply topically.               Discharge Care Instructions  (From admission, onward)           Start     Ordered   04/02/21 0000  Discharge wound care:       Comments: See orders   04/02/21 6010            Follow-up Information      [name], MD Follow up.   Specialty: Internal Medicine Contact information: [address] [place] [address] [phone]         Nahser, Wonda Cheng, MD .   Specialty: Cardiology Contact information: [place] [address] [phone]         Evans Lance, MD .   Specialty: Cardiology Contact information: [phone] [place] [address] [phone]                Allergies  Allergen Reactions   Beta Adrenergic Blockers Other (See Comments)    Fatigue    Consultations: None   Procedures/Studies: DG Chest 2 View  Result Date: 03/04/2021 CLINICAL DATA:  Shortness of breath. EXAM: CHEST - 2 VIEW COMPARISON:  February 10, 2021, August 15, 2017 FINDINGS: The heart size and mediastinal contours are stable. There are question 2-3 mm nodules in the left mid lung. There is no focal infiltrate, pulmonary edema. There is minimal right posterior pleural effusion. The visualized skeletal structures are stable. Stable right breast and axillary surgical clips are noted. IMPRESSION: No focal pneumonia. Question 2-3 mm nodules in the left mid lung. Recommend further evaluation with CT chest on outpatient basis. Electronically Signed   By: Abelardo Diesel M.D.   On: 03/04/2021 11:07   CT CHEST WO CONTRAST  Result Date: 03/30/2021 CLINICAL DATA:  Suspected pneumonia in a 88 year old female. EXAM: CT CHEST WITHOUT CONTRAST TECHNIQUE: Multidetector CT imaging of the chest was performed following the standard protocol without IV contrast. RADIATION DOSE REDUCTION: This exam was performed according to the departmental dose-optimization program which includes automated exposure control, adjustment of the mA and/or kV according to patient size and/or use of iterative reconstruction technique. COMPARISON:  Comparison made with March 14, 2021. FINDINGS: Cardiovascular: Dense mitral annular and valvular calcifications.  Heart size stable without pericardial effusion. Aortic caliber is normal with scattered atherosclerotic calcifications. Central pulmonary vasculature is normal caliber. Limited assessment of cardiovascular structures given lack of intravenous contrast. Mediastinum/Nodes: Mild fullness of subcarinal nodal tissue. No frank adenopathy in the chest. Esophagus is grossly normal. Lungs/Pleura: Post radiation changes along the anterolateral RIGHT upper lobe along with RIGHT apical  scarring with similar appearance. Diffuse nodular ground-glass changes have improved considerably since previous imaging. These are nearly completely resolved. There is persistent RIGHT-sided pleural fluid which is of similar volume compared to the prior study and is associated with volume loss at the RIGHT lung base. Airways are patent. Upper Abdomen: Cholelithiasis. Imaged portions of liver, gallbladder, pancreas, spleen, adrenal glands and of the kidneys are unremarkable. No acute upper abdominal findings to the extent evaluated. Musculoskeletal: Spinal degenerative changes. No acute or destructive bone finding. IMPRESSION: 1. Diffuse nodular ground-glass changes have improved considerably since the prior imaging. These are nearly completely resolved. 2. There is persistent RIGHT-sided pleural fluid which is of similar volume compared to the prior study and is associated with volume loss/consolidation at the RIGHT lung base. 3. Post radiation changes along the anterolateral RIGHT upper lobe along with RIGHT apical scarring with similar appearance. 4. Cholelithiasis. 5. Aortic atherosclerosis. Aortic Atherosclerosis (ICD10-I70.0). Electronically Signed   By: Zetta Bills M.D.   On: 03/30/2021 11:37   CT CHEST WO CONTRAST  Result Date: 03/14/2021 CLINICAL DATA:  Worsening shortness of breath. EXAM: CT CHEST WITHOUT CONTRAST TECHNIQUE: Multidetector CT imaging of the chest was performed following the standard protocol without IV contrast.  RADIATION DOSE REDUCTION: This exam was performed according to the departmental dose-optimization program which includes automated exposure control, adjustment of the mA and/or kV according to patient size and/or use of iterative reconstruction technique. COMPARISON:  CT chest dated February 11, 2021. FINDINGS: Cardiovascular: No significant vascular findings. Normal heart size. No pericardial effusion. Unchanged dense mitral annular calcification. No thoracic aortic aneurysm. Coronary, aortic arch, and branch vessel atherosclerotic vascular disease. Mediastinum/Nodes: No enlarged mediastinal or axillary lymph nodes. Thyroid gland and esophagus demonstrate no significant findings. Lungs/Pleura: Small amount of secretions in the trachea. Small right pleural effusion, increased in size since December. Unchanged trace left pleural effusion. Similar peripheral post radiation change in the right upper lobe. New small nodules and ill-defined ground-glass densities throughout both lungs with more consolidative areas in the right middle and lower lobes. Chronic dependent atelectasis in the posterior right greater than left lower lobes. Mild smooth interlobular septal thickening. No pneumothorax. Upper Abdomen: No acute abnormality. Musculoskeletal: No acute or significant osseous findings. IMPRESSION: 1. New small nodules and ill-defined ground-glass densities throughout both lungs with more consolidative areas in the right middle and lower lobes, concerning for multifocal pneumonia. 2. Small right pleural effusion, increased in size since December. Unchanged trace left pleural effusion. New mild interstitial pulmonary edema. 3. Aortic Atherosclerosis (ICD10-I70.0). Electronically Signed   By: Titus Dubin M.D.   On: 03/14/2021 16:01   DG Chest Portable 1 View  Result Date: 03/29/2021 CLINICAL DATA:  Dyspnea. EXAM: PORTABLE CHEST 1 VIEW COMPARISON:  Chest x-ray 03/09/2021.  CT chest 03/14/2021. FINDINGS: Small right  pleural effusion and minimal right basilar opacities persist. The lungs are otherwise clear. Cardiomediastinal silhouette is within normal limits. No pneumothorax. No acute fracture. Right axillary surgical clips are again noted. IMPRESSION: 1. Stable small right pleural effusion with minimal right basilar atelectasis/airspace disease. Electronically Signed   By: Ronney Asters M.D.   On: 03/29/2021 20:29   DG Chest Port 1 View  Result Date: 03/09/2021 CLINICAL DATA:  Shortness of breath. EXAM: PORTABLE CHEST 1 VIEW COMPARISON:  Radiograph 5 days ago 03/04/2021 chest CT 02/11/2021 FINDINGS: Patient's chin obscures the apices.The cardiomediastinal contours are unchanged. Some of the previous question left pulmonary nodules are again seen, some are obscured on the current exam. Planted  loop recorder in the left chest wall. Pulmonary vasculature is normal. No consolidation, pleural effusion, or pneumothorax. No acute osseous abnormalities are seen. Surgical clips in the right axilla. IMPRESSION: 1. No acute abnormality. 2. Some of the previously questioned tiny left pulmonary nodules are again seen. Electronically Signed   By: Keith Rake M.D.   On: 03/09/2021 22:58   ECHOCARDIOGRAM COMPLETE  Result Date: 03/10/2021    ECHOCARDIOGRAM REPORT   Patient Name:   OZETTA FLATLEY [facility] Date of Exam: 03/10/2021 Medical Rec #:  [id number]          Height:       59.0 in Accession #:    [id number]         Weight:       163.6 lb Date of Birth:  Jul 03, 1926          BSA:          1.693 m Patient Age:    7 years           BP:           169/48 mmHg Patient Gender: F                  HR:           88 bpm. Exam Location:  Inpatient Procedure: 2D Echo, Cardiac Doppler, Color Doppler and Intracardiac            Opacification Agent Indications:    Elevated Troponin  History:        Patient has prior history of Echocardiogram examinations, most                 recent 12/19/2020. Pacemaker, COPD and Stroke, Arrythmias:Atrial                  Fibrillation, Signs/Symptoms:Edema; Risk Factors:Diabetes and                 Hypertension.  Sonographer:    Glo Herring Referring Phys: [id number] [place]  1. Left ventricular ejection fraction, by estimation, is 60 to 65%. The left ventricle has normal function. The left ventricle has no regional wall motion abnormalities.  2. Right ventricular systolic function is normal. The right ventricular size is normal.  3. MV is thickened. Peakd and mean gradients through the valve are 13 and 6 mm Hg with MVA by VTI of 1.5 cm2 consistent with mild to moderate MS. No significant change in gradients from report from 12/19/20. . The mitral valve is degenerative. Trivial mitral valve regurgitation. Moderate to severe mitral annular calcification.  4. AV is thickened, calcified with restricted motion. Peak and mean gradients through the valve are 49 and 27 mm HG respectively. AVA (VTI) 1.04 cm2 Dimensionless index is 0.37. Consistent with moderate AS. No significant change from mean gradient from 12/19/20 (25 to 27 mm Hg). Aortic valve regurgitation is mild.  5. The inferior vena cava is dilated in size with <50% respiratory variability, suggesting right atrial pressure of 15 mmHg. FINDINGS  Left Ventricle: Left ventricular ejection fraction, by estimation, is 60 to 65%. The left ventricle has normal function. The left ventricle has no regional wall motion abnormalities. The left ventricular internal cavity size was normal in size. Right Ventricle: The right ventricular size is normal. Right vetricular wall thickness was not assessed. Right ventricular systolic function is normal. Right Atrium: Right atrial size was normal in size. Pericardium: There is no evidence of pericardial effusion. Mitral Valve: MV is thickened.  Peakd and mean gradients through the valve are 13 and 6 mm Hg with MVA by VTI of 1.5 cm2 consistent with mild to moderate MS. No significant change in gradients from report from 12/19/20.  The mitral valve is degenerative in  appearance. Moderate to severe mitral annular calcification. Trivial mitral valve regurgitation. MV peak gradient, 14.3 mmHg. The mean mitral valve gradient is 6.3 mmHg. Tricuspid Valve: The tricuspid valve is normal in structure. Tricuspid valve regurgitation is mild. Aortic Valve: AV is thickened, calcified with restricted motion. Peak and mean gradients through the valve are 49 and 27 mm HG respectively. AVA (VTI) 1.04 cm2 Dimensionless index is 0.37. Consistent with moderate AS. No significant change from mean gradient from 12/19/20 (25 to 27 mm Hg). Aortic valve regurgitation is mild. Aortic valve mean gradient measures 22.0 mmHg. Aortic valve peak gradient measures 39.9 mmHg. Aortic valve area, by VTI measures 1.05 cm. Pulmonic Valve: The pulmonic valve was not well visualized. Pulmonic valve regurgitation is mild. No evidence of pulmonic stenosis. Aorta: The aortic root and ascending aorta are structurally normal, with no evidence of dilitation. Venous: The inferior vena cava is dilated in size with less than 50% respiratory variability, suggesting right atrial pressure of 15 mmHg. IAS/Shunts: The interatrial septum was not assessed.  LEFT VENTRICLE PLAX 2D LVOT diam:     1.90 cm LV SV:         66 LV SV Index:   39 LVOT Area:     2.84 cm  RIGHT VENTRICLE             IVC RV Basal diam:  4.20 cm     IVC diam: 2.60 cm RV S prime:     15.90 cm/s RIGHT ATRIUM           Index RA Area:     12.10 cm RA Volume:   29.20 ml  17.24 ml/m  AORTIC VALVE                     PULMONIC VALVE AV Area (Vmax):    0.93 cm      PV Vmax:       1.33 m/s AV Area (Vmean):   0.89 cm      PV Peak grad:  7.1 mmHg AV Area (VTI):     1.05 cm AV Vmax:           316.00 cm/s AV Vmean:          221.000 cm/s AV VTI:            0.629 m AV Peak Grad:      39.9 mmHg AV Mean Grad:      22.0 mmHg LVOT Vmax:         103.50 cm/s LVOT Vmean:        69.700 cm/s LVOT VTI:          0.232 m LVOT/AV VTI ratio: 0.37   AORTA Ao Root diam: 2.80 cm Ao Asc diam:  2.60 cm MITRAL VALVE MV Area (PHT): 3.70 cm     SHUNTS MV Area VTI:   1.51 cm     Systemic VTI:  0.23 m MV Peak grad:  14.3 mmHg    Systemic Diam: 1.90 cm MV Mean grad:  6.3 mmHg MV Vmax:       1.89 m/s MV Vmean:      115.7 cm/s MV Decel Time: 205 msec MV E velocity: 183.00 cm/s MV A velocity: 114.00 cm/s MV E/A  ratio:  1.61 Dorris Carnes MD Electronically signed by Dorris Carnes MD Signature Date/Time: 03/10/2021/1:12:21 PM    Final    (Echo, Carotid, EGD, Colonoscopy, ERCP)    Subjective:  Patient is resting in bed caregiver by the bedside When I called her name and touched her she looked at me but went back to sleep immediately.  Discharge Exam: Vitals:   04/02/21 0839 04/02/21 0900  BP:  (!) 153/52  Pulse:  72  Resp:    Temp:    SpO2: 96% 95%   Vitals:   04/02/21 0500 04/02/21 0545 04/02/21 0839 04/02/21 0900  BP:  (!) 122/44  (!) 153/52  Pulse:  70  72  Resp:  20    Temp:  98.5 F (36.9 C)    TempSrc:  Oral    SpO2:  98% 96% 95%  Weight: 72.6 kg     Height:        General: Pt is alert, awake, not in acute distress Cardiovascular: RRR, S1/S2 +, no rubs, no gallops Respiratory: Few rhonchi bilaterally, no wheezing, no rhonchi Abdominal: Soft, NT, ND, bowel sounds + Extremities: no edema, no cyanosis    The results of significant diagnostics from this hospitalization (including imaging, microbiology, ancillary and laboratory) are listed below for reference.     Microbiology: Recent Results (from the past 240 hour(s))  Culture, blood (single)     Status: None (Preliminary result)   Collection Time: 03/29/21  7:59 PM   Specimen: BLOOD LEFT WRIST  Result Value Ref Range Status   Specimen Description   Final    BLOOD LEFT WRIST Performed at [facility] [address] [place], [place] [address]    Special Requests   Final    BOTTLES DRAWN AEROBIC AND ANAEROBIC Blood Culture adequate volume Performed at [facility] [address], [facility], [place] [address]    Culture   Final    NO GROWTH 3 DAYS Performed at [facility], [place] [address] [place], [place] [address]    Report Status PENDING  Incomplete  Resp Panel by RT-PCR (Flu A&B, Covid) Nasopharyngeal Swab     Status: None   Collection Time: 03/29/21  8:00 PM   Specimen: Nasopharyngeal Swab; Nasopharyngeal(NP) swabs in vial transport medium  Result Value Ref Range Status   SARS Coronavirus 2 by RT PCR NEGATIVE NEGATIVE Final    Comment: (NOTE) SARS-CoV-2 target nucleic acids are NOT DETECTED.  The SARS-CoV-2 RNA is generally detectable in upper respiratory specimens during the acute phase of infection. The lowest concentration of SARS-CoV-2 viral copies this assay can detect is 138 copies/mL. A negative result does not preclude SARS-Cov-2 infection and should not be used as the sole basis for treatment or other patient management decisions. A negative result may occur with  improper specimen collection/handling, submission of specimen other than nasopharyngeal swab, presence of viral mutation(s) within the areas targeted by this assay, and inadequate number of viral copies(<138 copies/mL). A negative result must be combined with clinical observations, patient history, and epidemiological information. The expected result is Negative.  Fact Sheet for Patients:  EntrepreneurPulse.com.au  Fact Sheet for Healthcare Providers:  IncredibleEmployment.be  This test is no t yet approved or cleared by the [place] FDA and  has been authorized for detection and/or diagnosis of SARS-CoV-2 by FDA under an Emergency Use Authorization (EUA). This EUA will remain  in effect (meaning this test can be used) for the duration of the COVID-19  declaration under Section 564(b)(1) of the Act, 21 U.S.C.section 360bbb-3(b)(1), unless the authorization is terminated  or revoked sooner.        Influenza A by PCR NEGATIVE NEGATIVE Final   Influenza B by PCR NEGATIVE NEGATIVE Final    Comment: (NOTE) The Xpert Xpress SARS-CoV-2/FLU/RSV plus assay is intended as an aid in the diagnosis of influenza from Nasopharyngeal swab specimens and should not be used as a sole basis for treatment. Nasal washings and aspirates are unacceptable for Xpert Xpress SARS-CoV-2/FLU/RSV testing.  Fact Sheet for Patients: EntrepreneurPulse.com.au  Fact Sheet for Healthcare Providers: IncredibleEmployment.be  This test is not yet approved or cleared by the [place] FDA and has been authorized for detection and/or diagnosis of SARS-CoV-2 by FDA under an Emergency Use Authorization (EUA). This EUA will remain in effect (meaning this test can be used) for the duration of the COVID-19 declaration under Section 564(b)(1) of the Act, 21 U.S.C. section 360bbb-3(b)(1), unless the authorization is terminated or revoked.  Performed at [facility], [name] [address], [place], [place] [id number]      Labs: BNP (last 3 results) Recent Labs    03/04/21 0954 03/09/21 2124 03/29/21 1959  BNP 323.0* 426.7* 353.6*   Basic Metabolic Panel: Recent Labs  Lab 03/29/21 1959 03/30/21 0344 03/31/21 0537 04/01/21 0517 04/02/21 0508  NA 128* 129* 134* 134* 136  K 3.8 3.9 3.7 3.4* 3.4*  CL 90* 92* 94* 94* 96*  CO2 27 29 27  32 31  GLUCOSE 313* 315* 315* 237* 212*  BUN 87* 83* 80* 75* 64*  CREATININE 2.14* 2.17* 2.04* 2.03* 1.74*  CALCIUM 10.7* 9.8 10.5* 10.4* 10.6*  MG  --  2.0  --   --   --    Liver Function Tests: Recent Labs  Lab 03/29/21 1959 04/02/21 0508  AST 15 11*  ALT 16 12  ALKPHOS 80 62  BILITOT 0.7 0.7  PROT 6.6 5.8*  ALBUMIN 3.2* 2.7*   No results for input(s): LIPASE, AMYLASE in the last 168 hours. No results for input(s): AMMONIA in the last 168 hours. CBC: Recent Labs  Lab 03/29/21 1959 03/30/21 0954  03/31/21 0537 04/01/21 0517 04/01/21 1600 04/02/21 0508  WBC 11.1* 9.3 12.2* 8.7  --  8.7  NEUTROABS 7.6 6.0 10.3* 4.9  --   --   HGB 7.8* 7.4* 7.7* 6.7* 8.4* 8.4*  HCT 23.6* 22.6* 24.5* 20.9* 25.7* 26.1*  MCV 97.5 98.3 101.2* 99.5  --  97.0  PLT 239 234 280 268  --  285   Cardiac Enzymes: No results for input(s): CKTOTAL, CKMB, CKMBINDEX, TROPONINI in the last 168 hours. BNP: Invalid input(s): POCBNP CBG: Recent Labs  Lab 04/01/21 0723 04/01/21 1131 04/01/21 1612 04/01/21 2000 04/02/21 0804  GLUCAP 242* 264* 269* 283* 217*   D-Dimer No results for input(s): DDIMER in the last 72 hours. Hgb A1c No results for input(s): HGBA1C in the last 72 hours. Lipid Profile No results for input(s): CHOL, HDL, LDLCALC, TRIG, CHOLHDL, LDLDIRECT in the last 72 hours. Thyroid function studies No results for input(s): TSH, T4TOTAL, T3FREE, THYROIDAB in the last 72 hours.  Invalid input(s): FREET3 Anemia work up No results for input(s): VITAMINB12, FOLATE, FERRITIN, TIBC, IRON, RETICCTPCT in the last 72 hours. Urinalysis    Component Value Date/Time   COLORURINE YELLOW 02/07/2021 2200   APPEARANCEUR CLEAR 02/07/2021 2200   LABSPEC <=1.005 02/07/2021 2200   PHURINE 6.0 02/07/2021 2200   GLUCOSEU 100 (A) 02/07/2021 2200   HGBUR  NEGATIVE 02/07/2021 2200   BILIRUBINUR NEGATIVE 02/07/2021 2200   KETONESUR NEGATIVE 02/07/2021 2200   PROTEINUR NEGATIVE 02/07/2021 2200   UROBILINOGEN 0.2 09/13/2013 0523   NITRITE NEGATIVE 02/07/2021 2200   LEUKOCYTESUR SMALL (A) 02/07/2021 2200   Sepsis Labs Invalid input(s): PROCALCITONIN,  WBC,  LACTICIDVEN Microbiology Recent Results (from the past 240 hour(s))  Culture, blood (single)     Status: None (Preliminary result)   Collection Time: 03/29/21  7:59 PM   Specimen: BLOOD LEFT WRIST  Result Value Ref Range Status   Specimen Description   Final    BLOOD LEFT WRIST Performed at [facility] [address]  [place], [place] [address]    Special Requests   Final    BOTTLES DRAWN AEROBIC AND ANAEROBIC Blood Culture adequate volume Performed at [facility] [address], [place], [place] [address]    Culture   Final    NO GROWTH 3 DAYS Performed at [facility], [place] [address], [place], [place] [address]    Report Status PENDING  Incomplete  Resp Panel by RT-PCR (Flu A&B, Covid) Nasopharyngeal Swab     Status: None   Collection Time: 03/29/21  8:00 PM   Specimen: Nasopharyngeal Swab; Nasopharyngeal(NP) swabs in vial transport medium  Result Value Ref Range Status   SARS Coronavirus 2 by RT PCR NEGATIVE NEGATIVE Final    Comment: (NOTE) SARS-CoV-2 target nucleic acids are NOT DETECTED.  The SARS-CoV-2 RNA is generally detectable in upper respiratory specimens during the acute phase of infection. The lowest concentration of SARS-CoV-2 viral copies this assay can detect is 138 copies/mL. A negative result does not preclude SARS-Cov-2 infection and should not be used as the sole basis for treatment or other patient management decisions. A negative result may occur with  improper specimen collection/handling, submission of specimen other than nasopharyngeal swab, presence of viral mutation(s) within the areas targeted by this assay, and inadequate number of viral copies(<138 copies/mL). A negative result must be combined with clinical observations, patient history, and epidemiological information. The expected result is Negative.  Fact Sheet for Patients:  EntrepreneurPulse.com.au  Fact Sheet for Healthcare Providers:  IncredibleEmployment.be  This test is no t yet approved or cleared by the [place] FDA and  has been authorized for detection and/or diagnosis of SARS-CoV-2 by FDA under an Emergency Use Authorization (EUA). This EUA will remain  in effect (meaning this test can be used) for the duration of the COVID-19  declaration under Section 564(b)(1) of the Act, 21 U.S.C.section 360bbb-3(b)(1), unless the authorization is terminated  or revoked sooner.       Influenza A by PCR NEGATIVE NEGATIVE Final   Influenza B by PCR NEGATIVE NEGATIVE Final    Comment: (NOTE) The Xpert Xpress SARS-CoV-2/FLU/RSV plus assay is intended as an aid in the diagnosis of influenza from Nasopharyngeal swab specimens and should not be used as a sole basis for treatment. Nasal washings and aspirates are unacceptable for Xpert Xpress SARS-CoV-2/FLU/RSV testing.  Fact Sheet for Patients: EntrepreneurPulse.com.au  Fact Sheet for Healthcare Providers: IncredibleEmployment.be  This test is not yet approved or cleared by the [place] FDA and has been authorized for detection and/or diagnosis of SARS-CoV-2 by FDA under an Emergency Use Authorization (EUA). This EUA will remain in effect (meaning this test can be used) for the duration of the COVID-19 declaration under Section 564(b)(1) of the Act, 21 U.S.C. section 360bbb-3(b)(1), unless the authorization is terminated or revoked.  Performed at  [facility], [place] [address], [place], [place] [address]      Time coordinating discharge: 39 minutes  SIGNED:   Georgette Shell, MD  [facility] 04/02/2021, 9:44 AM

## 2021-04-02 NOTE — TOC Transition Note (Addendum)
Transition of Care [facility]) - CM/SW Discharge Note   Patient Details  Name: Sabrina [name] MRN: [id number] Date of Birth: 1926/11/18  Transition of Care [facility]) CM/SW Contact:  Trish Mage, LCSW Phone Number: 04/02/2021, 9:54 AM   Clinical Narrative:   Referral made to Ms Julian Hy at [facility] for outpatient palliative services via MD request.  No further needs identified.  Addendum: PTAR arranged for transportation home.   Final next level of care: Home/Self Care Barriers to Discharge: Barriers Resolved   Patient Goals and CMS Choice        Discharge Placement                       Discharge Plan and Services                                     Social Determinants of Health (SDOH) Interventions     Readmission Risk Interventions Readmission Risk Prevention Plan 12/22/2020 12/20/2020  Transportation Screening Complete Complete  PCP or Specialist Appt within 5-7 Days Complete Complete  Home Care Screening Complete Complete  Medication Review (RN CM) Complete Complete  Some recent data might be hidden

## 2021-04-03 ENCOUNTER — Telehealth: Payer: Self-pay

## 2021-04-03 ENCOUNTER — Other Ambulatory Visit: Payer: Medicare Other

## 2021-04-03 DIAGNOSIS — Z515 Encounter for palliative care: Secondary | ICD-10-CM

## 2021-04-03 LAB — CULTURE, BLOOD (SINGLE)
Culture: NO GROWTH
Special Requests: ADEQUATE

## 2021-04-03 NOTE — Telephone Encounter (Signed)
Phone call returned to patient. Spoke with patient's caregiver. Patient is sleeping. Plan is to call back on 04/04/21

## 2021-04-05 ENCOUNTER — Other Ambulatory Visit: Payer: Medicare Other

## 2021-04-05 ENCOUNTER — Other Ambulatory Visit: Payer: Self-pay

## 2021-04-05 NOTE — Progress Notes (Signed)
PATIENT NAME: Jackilyn Umphlett DOB: 1927/01/01 MRN: [id number]  PRIMARY CARE PROVIDER: [name], MD  RESPONSIBLE PARTY:  Acct ID - Guarantor Home Phone Work Phone Relationship Acct Type  [id number] ZIARA, THELANDER [phone]  Self P/F     [place], [place], [place] [address]     RN Palliative care telephonic encounter completed with patient's caregiver. Patient recently discharged from hospital. She was hospitalized for chronic diastolic heart failure with acute and chronic respiratory failure with hypoxia. Per caregiver, patient is at her baseline. She is napping at this time. No evidence of worsening shortness of breath or pain. Caregiver denies any concerns at this time. Plan is to connect with patient for follow up.     Cornelius Moras, RN

## 2021-04-06 ENCOUNTER — Other Ambulatory Visit: Payer: Medicare Other

## 2021-04-06 VITALS — BP 136/60 | HR 84 | Resp 18

## 2021-04-06 DIAGNOSIS — Z515 Encounter for palliative care: Secondary | ICD-10-CM

## 2021-04-07 NOTE — Progress Notes (Signed)
PATIENT NAME: Sabrina [name] DOB: 03-Sep-1926 MRN: [id number]  PRIMARY CARE PROVIDER: [name], MD  RESPONSIBLE PARTY:  Acct ID - Guarantor Home Phone Work Phone Relationship Acct Type  [id number] - [place]ER[place], BOCCIO [phone]  Self P/F     [place], [place], [place] [address]  COMMUNITY PALLIATIVE CARE RN NOTE    PLAN OF CARE and INTERVENTION:  ADVANCE CARE PLANNING/GOALS OF CARE: Comfort, safety, remain in home CAREGIVER EDUCATION: Palliative care, Hospice care DISEASE STATUS: RN Palliative care visit completed. Met with patient and her caregivers in her home. Patient in her hospital bed in the living room. Spoke with Larey Seat and [place], paid caregivers, who have been providing care to [name] for the past 4-5 years. Report received that patient has been talking more to herself and has displayed more confusion. Patient has been sleeping more over the past month. She is incontinent of bowel and has a foley catheter in place. Buttocks and sacral area red, stage 2 noted to the inner aspect of her left buttock, measuring 1.2cm x 0.5 cm. Caregivers continue to use barrier ointment. PO intake has declined. Note risk for aspiration, observed patient coughing after drinking small amount of fluid. Needs are anticipated and met be caregivers. [place] involved.  Palliative care NP updated on visit. HISTORY OF PRESENT ILLNESS: This is a 88 year old female with diagnosis of COPD, CHF, DM, and urinary retention (foley catheter in place) Palliative care has been asked to follow for additional support, care and complex decision making.   CODE STATUS: DNR PPS: 30% PHYSICAL EXAM:  Pulmonary: Respirations unlabored, audible cough when drinking. LS CTA Cardiac: RRR, no evidence of chest pain Neurological: generalized weakness, increasing confusion Abdomen: Soft, non-tender, BS positive.  Foley catheter patent, draining clear yellow urine.    Duration: 34mn       BMaxwell CaulRN,  BSN  BCornelius Moras RN

## 2021-04-09 ENCOUNTER — Telehealth: Payer: Self-pay | Admitting: Family Medicine

## 2021-04-09 NOTE — Telephone Encounter (Signed)
Had received phone message from [place] with [facility] requesting call back to [phone].  Left vm that I was returning her call, that Palliative nurse made visit on 04/03/21 and that we had been informed that pt has 25 rides by ambulance through her benefit and that she was using one to take her to her Urologist appt this week.  Left phone number for call back.  Damaris Hippo FNP-C

## 2021-04-13 ENCOUNTER — Telehealth: Payer: Self-pay | Admitting: Family Medicine

## 2021-04-13 DIAGNOSIS — Z515 Encounter for palliative care: Secondary | ICD-10-CM

## 2021-04-13 DIAGNOSIS — R5381 Other malaise: Secondary | ICD-10-CM

## 2021-04-13 DIAGNOSIS — Z978 Presence of other specified devices: Secondary | ICD-10-CM

## 2021-04-13 DIAGNOSIS — Z7189 Other specified counseling: Secondary | ICD-10-CM | POA: Insufficient documentation

## 2021-04-13 DIAGNOSIS — J9621 Acute and chronic respiratory failure with hypoxia: Secondary | ICD-10-CM

## 2021-04-13 NOTE — Telephone Encounter (Signed)
[facility] Consult Note Telephone: [phone]  Fax: [phone]    Date of encounter: 04/13/21 1:03 PM PATIENT NAME: Sabrina [name] [address] [id number]   [phone] (home)  DOB: 1926-08-10 MRN: [id number] PRIMARY CARE PROVIDER:    [name], MD,  [place]. [address] [place] [address] [phone]  REFERRING PROVIDER:   No referring provider defined for this encounter. N/A  RESPONSIBLE PARTY:    Contact Information     Name Relation Home Work Mobile   [place] [phone]  [phone]   Eloisa Northern Niece   [phone]        I connected with Rhea Belton (nephew) and Eloisa Northern (niece) who are both [facility] POA by phone on 04/13/21 they were not present with patient to enable video visit.  Telemedicine application and verified that I am speaking with the correct person using two identifiers.   I discussed the limitations of evaluation and management by telemedicine. The patient expressed understanding and agreed to proceed.                                    ASSESSMENT, SYMPTOM MANAGEMENT AND PLAN / RECOMMENDATIONS:  Chronic hypoxic respiratory failure Currently wheezing, requiring additional breathing treatments for comfort and ease of breathing.  Recently inpatient for HCAP with possible aspiration.  Palliative Care Encounter Continued decline, weight loss confusion.  Decline in PPS score from 40 to 30%.  Family agreeable to Hospice level of care with goal of comfort.  3.  Physical deconditioning Unable to participate with PT from home health. Now bedbound.  4.  Foley catheter in place on admission Pt was treated for urinary retention with placement of foley. Unable to ambulate and follow up with Urologist to promote removal of foley. Has been treated for UTI and foley cath change. Leave in place for terminal management.    Advance Care Planning/Goals of Care:  Goals include to maximize quality of life and symptom management. Health care surrogate gave their permission to discuss. Our advance care planning conversation included a discussion about:    Exploration of personal, cultural or spiritual beliefs that might influence medical decisions-family desires comfort but has been wanting to continue hospitalization and antibiotic treatment for infection.  Pt continuing to decline despite treatment. Advised HC POAs that since pt is having potential aspiration and has been unable to get up to use bedside commode that she is at risk for recurrent infection and that treating will only slow down the process but do not believe it will stop or improve significantly to the quality of life pt previously enjoyed. Exploration of goals of care in the event of a sudden injury or illness              Advised with Hospice they could do             Inpatient care and possibly antibiotics             For comfort.  Feel like they need             Additional help with pt's care. Decision not to resuscitate or to de-escalate disease focused treatments due to poor prognosis. CODE STATUS: DNR    Follow up Palliative Care Visit: Palliative care will make Hospice referral if supervising physician in agreement.    This visit was coded based on medical  decision making (MDM).  PPS: 30%  HOSPICE ELIGIBILITY/DIAGNOSIS: TBD  Chief Complaint:  Awaiting a call to transition patient to Hospice.  Received a call from [place] with [facility], home care agency that family has been expecting a call from Hospice this week.   HISTORY OF PRESENT ILLNESS:  Sabrina [name] is a 88 y.o. year old female  with COPD, hx of CVA, DM, chronic respiratory failure, hx of DVT and PE, hx of right breast cancer, aortic stenosis, chronic diastolic heart failure, PAF on anticoagulation and CKD stage IV. She fell in October and fractured her ankle and has since been declining.  She was able to ambulate, was  mostly independent with bathing/dressing and assist with toileting until hospitalization in early/mid and late January 2023 for bronchitis, then COPD and finally HCAP.  She was noted on initial admission to have urinary retention and had foley placed with plan for outpatient urology follow up.  Pt was working with [facility] who was trying to get PT in to do therapy to see if pt could get back up to bedside commode so we could take out foley and do trial of voiding.  Pt was unable to get started with PT before being transitioned back to the hospital again.  She has a benefit through her insurance for ambulance transport by stretcher to/from medical appointments for 12 round trip visits.  Nephew and niece are [facility] POA.  Spoke with them to update goals of care and they are in agreement with Hospice referral.  They said pt was seen by ST from [facility] yesterday and she noted that new in-home paid caregivers were attempting to give pt food and fluids while lying down.  They worked with her to get the patient pulled up in the bed and give her nebulizer treatments for wheezing and between 2 treatments and sitting her up at 90 degrees pt was able to breathe better.  They stated she knew her nephew, did not know niece, was unaware of the date or her birthday and indicated "Royanne Foots" was the president. They said that her routine caregivers have had some success with getting her to eat soft foods and yogurts. Pt was inpatient on 03/09/2021 with a weight of 75.1 kg, when she returned on 03/29/2021 her weight had decreased to 72.6 kg. She continues to have the foley in place and has had increased wheezing and coughing. ON 04/02/21 CMP with K+ 3.4, CL 96, glucose 212, BUN 64, Cr 1.74, Ca 10.6 and eGFR 19.  Albumin had declined from 3.1 to 2.7. CBC with RBC 2.69, HGB 8.4 (up from nadir of 6.7).  Fecal occult blood was positive.  BNP on 03/29/21 was elevated at 247.9. She was treated during inpatient stay with Rocephin and  Azithromycin for RLL effusion and consolidation. Hospitalist discussed with niece [facility] POA about stopping Eliquis and her preference at that time was to continue and observe to see if there was any further bleeding or need for another blood transfusion as she received 1 unit.  Wound documentation from hospital: 03/30/21 1545  Location: Sacrum  Location Orientation:   Staging: Stage 2 -  Partial thickness loss of dermis presenting as a shallow open injury with a red, pink wound bed without slough.  Wound Description (Comments):   Present on Admission: Yes    History obtained from review of EMR, discussion with home health nurse, and interview with family of Ms. Bonenberger.  I reviewed available labs, medications, imaging, studies and related documents  from the EMR.  Records reviewed and summarized above.   ROS Confused, unable to obtain from pt, per family and home health RN  Current and past weights: As per above 03/29/21 72.6 kg.   Thank you for the opportunity to participate in the care of Ms. Lenzo.  The palliative care team will continue to follow. Please call our office at [phone] if we can be of additional assistance.   Marijo Conception, FNP   COVID-19 PATIENT SCREENING TOOL Asked and negative response unless otherwise noted:   Have you had symptoms of covid, tested positive or been in contact with someone with symptoms/positive test in the past 5-10 days?  NO

## 2021-04-17 ENCOUNTER — Encounter (HOSPITAL_COMMUNITY): Payer: Self-pay | Admitting: Internal Medicine

## 2021-04-17 ENCOUNTER — Other Ambulatory Visit: Payer: Self-pay

## 2021-04-17 ENCOUNTER — Emergency Department (HOSPITAL_COMMUNITY)

## 2021-04-17 ENCOUNTER — Inpatient Hospital Stay (HOSPITAL_COMMUNITY)
Admission: EM | Admit: 2021-04-17 | Discharge: 2021-04-23 | DRG: 190 | Disposition: A | Discharge status: Hospice | Attending: Internal Medicine | Admitting: Internal Medicine

## 2021-04-17 DIAGNOSIS — Z950 Presence of cardiac pacemaker: Secondary | ICD-10-CM

## 2021-04-17 DIAGNOSIS — E66811 Obesity, class 1: Secondary | ICD-10-CM

## 2021-04-17 DIAGNOSIS — Z7901 Long term (current) use of anticoagulants: Secondary | ICD-10-CM

## 2021-04-17 DIAGNOSIS — E1122 Type 2 diabetes mellitus with diabetic chronic kidney disease: Secondary | ICD-10-CM | POA: Diagnosis present

## 2021-04-17 DIAGNOSIS — E871 Hypo-osmolality and hyponatremia: Secondary | ICD-10-CM | POA: Diagnosis not present

## 2021-04-17 DIAGNOSIS — I350 Nonrheumatic aortic (valve) stenosis: Secondary | ICD-10-CM | POA: Diagnosis not present

## 2021-04-17 DIAGNOSIS — I5032 Chronic diastolic (congestive) heart failure: Secondary | ICD-10-CM | POA: Diagnosis not present

## 2021-04-17 DIAGNOSIS — D72829 Elevated white blood cell count, unspecified: Secondary | ICD-10-CM

## 2021-04-17 DIAGNOSIS — N184 Chronic kidney disease, stage 4 (severe): Secondary | ICD-10-CM | POA: Diagnosis present

## 2021-04-17 DIAGNOSIS — K921 Melena: Secondary | ICD-10-CM | POA: Diagnosis not present

## 2021-04-17 DIAGNOSIS — R778 Other specified abnormalities of plasma proteins: Secondary | ICD-10-CM | POA: Diagnosis not present

## 2021-04-17 DIAGNOSIS — Z20822 Contact with and (suspected) exposure to covid-19: Secondary | ICD-10-CM | POA: Diagnosis present

## 2021-04-17 DIAGNOSIS — Z888 Allergy status to other drugs, medicaments and biological substances status: Secondary | ICD-10-CM

## 2021-04-17 DIAGNOSIS — J441 Chronic obstructive pulmonary disease with (acute) exacerbation: Principal | ICD-10-CM | POA: Diagnosis present

## 2021-04-17 DIAGNOSIS — L89152 Pressure ulcer of sacral region, stage 2: Secondary | ICD-10-CM | POA: Diagnosis present

## 2021-04-17 DIAGNOSIS — Z86718 Personal history of other venous thrombosis and embolism: Secondary | ICD-10-CM

## 2021-04-17 DIAGNOSIS — I160 Hypertensive urgency: Secondary | ICD-10-CM | POA: Diagnosis not present

## 2021-04-17 DIAGNOSIS — Z85828 Personal history of other malignant neoplasm of skin: Secondary | ICD-10-CM

## 2021-04-17 DIAGNOSIS — R5383 Other fatigue: Secondary | ICD-10-CM | POA: Diagnosis not present

## 2021-04-17 DIAGNOSIS — R7989 Other specified abnormal findings of blood chemistry: Secondary | ICD-10-CM | POA: Diagnosis present

## 2021-04-17 DIAGNOSIS — Z8673 Personal history of transient ischemic attack (TIA), and cerebral infarction without residual deficits: Secondary | ICD-10-CM

## 2021-04-17 DIAGNOSIS — I480 Paroxysmal atrial fibrillation: Secondary | ICD-10-CM | POA: Diagnosis present

## 2021-04-17 DIAGNOSIS — I248 Other forms of acute ischemic heart disease: Secondary | ICD-10-CM | POA: Diagnosis present

## 2021-04-17 DIAGNOSIS — E1165 Type 2 diabetes mellitus with hyperglycemia: Secondary | ICD-10-CM | POA: Diagnosis present

## 2021-04-17 DIAGNOSIS — Z515 Encounter for palliative care: Secondary | ICD-10-CM

## 2021-04-17 DIAGNOSIS — E7800 Pure hypercholesterolemia, unspecified: Secondary | ICD-10-CM | POA: Diagnosis present

## 2021-04-17 DIAGNOSIS — Z6830 Body mass index (BMI) 30.0-30.9, adult: Secondary | ICD-10-CM

## 2021-04-17 DIAGNOSIS — F0390 Unspecified dementia without behavioral disturbance: Secondary | ICD-10-CM | POA: Diagnosis present

## 2021-04-17 DIAGNOSIS — N179 Acute kidney failure, unspecified: Secondary | ICD-10-CM | POA: Diagnosis not present

## 2021-04-17 DIAGNOSIS — Z803 Family history of malignant neoplasm of breast: Secondary | ICD-10-CM

## 2021-04-17 DIAGNOSIS — N189 Chronic kidney disease, unspecified: Secondary | ICD-10-CM | POA: Diagnosis not present

## 2021-04-17 DIAGNOSIS — L89159 Pressure ulcer of sacral region, unspecified stage: Secondary | ICD-10-CM

## 2021-04-17 DIAGNOSIS — K922 Gastrointestinal hemorrhage, unspecified: Secondary | ICD-10-CM

## 2021-04-17 DIAGNOSIS — D631 Anemia in chronic kidney disease: Secondary | ICD-10-CM | POA: Diagnosis present

## 2021-04-17 DIAGNOSIS — J9601 Acute respiratory failure with hypoxia: Secondary | ICD-10-CM | POA: Diagnosis not present

## 2021-04-17 DIAGNOSIS — E860 Dehydration: Secondary | ICD-10-CM | POA: Diagnosis not present

## 2021-04-17 DIAGNOSIS — Z7189 Other specified counseling: Secondary | ICD-10-CM | POA: Diagnosis not present

## 2021-04-17 DIAGNOSIS — Z923 Personal history of irradiation: Secondary | ICD-10-CM

## 2021-04-17 DIAGNOSIS — Z853 Personal history of malignant neoplasm of breast: Secondary | ICD-10-CM

## 2021-04-17 DIAGNOSIS — Z66 Do not resuscitate: Secondary | ICD-10-CM | POA: Diagnosis present

## 2021-04-17 DIAGNOSIS — D62 Acute posthemorrhagic anemia: Secondary | ICD-10-CM | POA: Diagnosis not present

## 2021-04-17 DIAGNOSIS — Z8041 Family history of malignant neoplasm of ovary: Secondary | ICD-10-CM

## 2021-04-17 DIAGNOSIS — I130 Hypertensive heart and chronic kidney disease with heart failure and stage 1 through stage 4 chronic kidney disease, or unspecified chronic kidney disease: Secondary | ICD-10-CM | POA: Diagnosis present

## 2021-04-17 DIAGNOSIS — R0602 Shortness of breath: Secondary | ICD-10-CM

## 2021-04-17 DIAGNOSIS — G9341 Metabolic encephalopathy: Secondary | ICD-10-CM | POA: Diagnosis not present

## 2021-04-17 DIAGNOSIS — Z79899 Other long term (current) drug therapy: Secondary | ICD-10-CM

## 2021-04-17 DIAGNOSIS — Z7401 Bed confinement status: Secondary | ICD-10-CM

## 2021-04-17 DIAGNOSIS — M109 Gout, unspecified: Secondary | ICD-10-CM | POA: Diagnosis present

## 2021-04-17 DIAGNOSIS — I4891 Unspecified atrial fibrillation: Secondary | ICD-10-CM

## 2021-04-17 DIAGNOSIS — Z9221 Personal history of antineoplastic chemotherapy: Secondary | ICD-10-CM

## 2021-04-17 DIAGNOSIS — Z86711 Personal history of pulmonary embolism: Secondary | ICD-10-CM

## 2021-04-17 DIAGNOSIS — E669 Obesity, unspecified: Secondary | ICD-10-CM

## 2021-04-17 DIAGNOSIS — Z8249 Family history of ischemic heart disease and other diseases of the circulatory system: Secondary | ICD-10-CM

## 2021-04-17 LAB — COMPREHENSIVE METABOLIC PANEL
ALT: 12 U/L (ref 0–44)
AST: 14 U/L — ABNORMAL LOW (ref 15–41)
Albumin: 3.6 g/dL (ref 3.5–5.0)
Alkaline Phosphatase: 79 U/L (ref 38–126)
Anion gap: 11 (ref 5–15)
BUN: 81 mg/dL — ABNORMAL HIGH (ref 8–23)
CO2: 32 mmol/L (ref 22–32)
Calcium: 11.5 mg/dL — ABNORMAL HIGH (ref 8.9–10.3)
Chloride: 87 mmol/L — ABNORMAL LOW (ref 98–111)
Creatinine, Ser: 2.47 mg/dL — ABNORMAL HIGH (ref 0.44–1.00)
GFR, Estimated: 18 mL/min — ABNORMAL LOW (ref >60)
Glucose, Bld: 412 mg/dL — ABNORMAL HIGH (ref 70–99)
Potassium: 4.5 mmol/L (ref 3.5–5.1)
Sodium: 130 mmol/L — ABNORMAL LOW (ref 135–145)
Total Bilirubin: 0.5 mg/dL (ref 0.3–1.2)
Total Protein: 7.3 g/dL (ref 6.5–8.1)

## 2021-04-17 LAB — I-STAT CHEM 8, ED
BUN: 85 mg/dL — ABNORMAL HIGH (ref 8–23)
Calcium, Ion: 1.38 mmol/L (ref 1.15–1.40)
Chloride: 90 mmol/L — ABNORMAL LOW (ref 98–111)
Creatinine, Ser: 2.5 mg/dL — ABNORMAL HIGH (ref 0.44–1.00)
Glucose, Bld: 412 mg/dL — ABNORMAL HIGH (ref 70–99)
HCT: 30 % — ABNORMAL LOW (ref 36.0–46.0)
Hemoglobin: 10.2 g/dL — ABNORMAL LOW (ref 12.0–15.0)
Potassium: 4.6 mmol/L (ref 3.5–5.1)
Sodium: 129 mmol/L — ABNORMAL LOW (ref 135–145)
TCO2: 35 mmol/L — ABNORMAL HIGH (ref 22–32)

## 2021-04-17 LAB — CBC WITH DIFFERENTIAL/PLATELET
Abs Immature Granulocytes: 0.09 10*3/uL — ABNORMAL HIGH (ref 0.00–0.07)
Basophils Absolute: 0.1 10*3/uL (ref 0.0–0.1)
Basophils Relative: 1 %
Eosinophils Absolute: 0.4 10*3/uL (ref 0.0–0.5)
Eosinophils Relative: 2 %
HCT: 29.5 % — ABNORMAL LOW (ref 36.0–46.0)
Hemoglobin: 9.3 g/dL — ABNORMAL LOW (ref 12.0–15.0)
Immature Granulocytes: 1 %
Lymphocytes Relative: 15 %
Lymphs Abs: 2.7 10*3/uL (ref 0.7–4.0)
MCH: 31.4 pg (ref 26.0–34.0)
MCHC: 31.5 g/dL (ref 30.0–36.0)
MCV: 99.7 fL (ref 80.0–100.0)
Monocytes Absolute: 1.3 10*3/uL — ABNORMAL HIGH (ref 0.1–1.0)
Monocytes Relative: 7 %
Neutro Abs: 14 10*3/uL — ABNORMAL HIGH (ref 1.7–7.7)
Neutrophils Relative %: 74 %
Platelets: 314 10*3/uL (ref 150–400)
RBC: 2.96 MIL/uL — ABNORMAL LOW (ref 3.87–5.11)
RDW: 15.1 % (ref 11.5–15.5)
WBC: 18.6 10*3/uL — ABNORMAL HIGH (ref 4.0–10.5)
nRBC: 0 % (ref 0.0–0.2)

## 2021-04-17 LAB — BRAIN NATRIURETIC PEPTIDE: B Natriuretic Peptide: 257.1 pg/mL — ABNORMAL HIGH (ref 0.0–100.0)

## 2021-04-17 LAB — PROCALCITONIN: Procalcitonin: 0.73 ng/mL

## 2021-04-17 LAB — BLOOD GAS, VENOUS
Acid-Base Excess: 6.1 mmol/L — ABNORMAL HIGH (ref 0.0–2.0)
Bicarbonate: 34.5 mmol/L — ABNORMAL HIGH (ref 20.0–28.0)
O2 Saturation: 97.7 %
Patient temperature: 37
pCO2, Ven: 67 mmHg — ABNORMAL HIGH (ref 44–60)
pH, Ven: 7.32 (ref 7.25–7.43)
pO2, Ven: 81 mmHg — ABNORMAL HIGH (ref 32–45)

## 2021-04-17 LAB — TROPONIN I (HIGH SENSITIVITY)
Troponin I (High Sensitivity): 39 ng/L — ABNORMAL HIGH (ref <18)
Troponin I (High Sensitivity): 95 ng/L — ABNORMAL HIGH (ref <18)

## 2021-04-17 LAB — RESP PANEL BY RT-PCR (FLU A&B, COVID) ARPGX2
Influenza A by PCR: NEGATIVE
Influenza B by PCR: NEGATIVE
SARS Coronavirus 2 by RT PCR: NEGATIVE

## 2021-04-17 LAB — MAGNESIUM: Magnesium: 2.2 mg/dL (ref 1.7–2.4)

## 2021-04-17 LAB — GLUCOSE, CAPILLARY: Glucose-Capillary: 447 mg/dL — ABNORMAL HIGH (ref 70–99)

## 2021-04-17 LAB — MRSA NEXT GEN BY PCR, NASAL: MRSA by PCR Next Gen: NOT DETECTED

## 2021-04-17 MED ORDER — INSULIN ASPART 100 UNIT/ML IJ SOLN
10.0000 [IU] | Freq: Once | INTRAMUSCULAR | Status: AC
  Administered 2021-04-17: 10 [IU] via SUBCUTANEOUS

## 2021-04-17 MED ORDER — IPRATROPIUM BROMIDE 0.02 % IN SOLN: 0.5000 mg | Freq: Four times a day (QID) | RESPIRATORY_TRACT | Status: DC

## 2021-04-17 MED ORDER — ACETAMINOPHEN 650 MG RE SUPP: 650.0000 mg | Freq: Four times a day (QID) | RECTAL | Status: DC | PRN

## 2021-04-17 MED ORDER — DILTIAZEM HCL-DEXTROSE 125-5 MG/125ML-% IV SOLN (PREMIX)
5.0000 mg/h | INTRAVENOUS | Status: DC
  Administered 2021-04-17: 5 mg/h via INTRAVENOUS
  Administered 2021-04-18: 7.5 mg/h via INTRAVENOUS
  Filled 2021-04-17 (×2): qty 125

## 2021-04-17 MED ORDER — FAMOTIDINE 20 MG PO TABS
20.0000 mg | ORAL_TABLET | Freq: Every day | ORAL | Status: DC
  Administered 2021-04-18: 20 mg via ORAL
  Filled 2021-04-17 (×2): qty 1

## 2021-04-17 MED ORDER — MAGNESIUM SULFATE 2 GM/50ML IV SOLN
2.0000 g | Freq: Once | INTRAVENOUS | Status: AC
  Administered 2021-04-17: 2 g via INTRAVENOUS
  Filled 2021-04-17: qty 50

## 2021-04-17 MED ORDER — DILTIAZEM HCL 60 MG PO TABS
60.0000 mg | ORAL_TABLET | Freq: Three times a day (TID) | ORAL | Status: DC
  Administered 2021-04-18: 60 mg via ORAL
  Filled 2021-04-17: qty 1

## 2021-04-17 MED ORDER — ISOSORBIDE MONONITRATE ER 30 MG PO TB24
30.0000 mg | ORAL_TABLET | Freq: Every day | ORAL | Status: DC
  Administered 2021-04-18: 30 mg via ORAL
  Filled 2021-04-17 (×2): qty 1

## 2021-04-17 MED ORDER — APIXABAN 2.5 MG PO TABS
2.5000 mg | ORAL_TABLET | Freq: Two times a day (BID) | ORAL | Status: DC
  Administered 2021-04-17 – 2021-04-18 (×2): 2.5 mg via ORAL
  Filled 2021-04-17 (×2): qty 1

## 2021-04-17 MED ORDER — METHYLPREDNISOLONE SODIUM SUCC 125 MG IJ SOLR
80.0000 mg | Freq: Two times a day (BID) | INTRAMUSCULAR | Status: DC
  Administered 2021-04-18: 80 mg via INTRAVENOUS
  Filled 2021-04-17: qty 2

## 2021-04-17 MED ORDER — SODIUM CHLORIDE 0.9 % IV BOLUS
500.0000 mL | Freq: Once | INTRAVENOUS | Status: AC
  Administered 2021-04-17: 500 mL via INTRAVENOUS

## 2021-04-17 MED ORDER — INSULIN ASPART 100 UNIT/ML IJ SOLN
0.0000 [IU] | Freq: Three times a day (TID) | INTRAMUSCULAR | Status: DC
  Administered 2021-04-18: 7 [IU] via SUBCUTANEOUS
  Administered 2021-04-18: 9 [IU] via SUBCUTANEOUS
  Administered 2021-04-18 – 2021-04-19 (×2): 7 [IU] via SUBCUTANEOUS
  Administered 2021-04-19: 3 [IU] via SUBCUTANEOUS
  Filled 2021-04-17: qty 0.09

## 2021-04-17 MED ORDER — LEVALBUTEROL HCL 1.25 MG/0.5ML IN NEBU
1.2500 mg | INHALATION_SOLUTION | Freq: Three times a day (TID) | RESPIRATORY_TRACT | Status: DC
  Administered 2021-04-18 – 2021-04-19 (×4): 1.25 mg via RESPIRATORY_TRACT
  Filled 2021-04-17 (×4): qty 0.5

## 2021-04-17 MED ORDER — ACETAMINOPHEN 325 MG PO TABS: 650.0000 mg | ORAL_TABLET | Freq: Four times a day (QID) | ORAL | Status: DC | PRN

## 2021-04-17 MED ORDER — CHLORHEXIDINE GLUCONATE CLOTH 2 % EX PADS
6.0000 | MEDICATED_PAD | Freq: Every day | CUTANEOUS | Status: DC
  Administered 2021-04-17 – 2021-04-23 (×7): 6 via TOPICAL

## 2021-04-17 MED ORDER — LEVALBUTEROL HCL 1.25 MG/0.5ML IN NEBU: 1.2500 mg | INHALATION_SOLUTION | RESPIRATORY_TRACT | Status: DC | PRN

## 2021-04-17 MED ORDER — LACTATED RINGERS IV BOLUS
500.0000 mL | Freq: Once | INTRAVENOUS | Status: AC
  Administered 2021-04-17: 500 mL via INTRAVENOUS

## 2021-04-17 MED ORDER — LEVALBUTEROL HCL 1.25 MG/0.5ML IN NEBU: 1.2500 mg | INHALATION_SOLUTION | Freq: Four times a day (QID) | RESPIRATORY_TRACT | Status: DC

## 2021-04-17 MED ORDER — IPRATROPIUM BROMIDE 0.02 % IN SOLN
0.5000 mg | Freq: Three times a day (TID) | RESPIRATORY_TRACT | Status: DC
  Administered 2021-04-18 – 2021-04-19 (×4): 0.5 mg via RESPIRATORY_TRACT
  Filled 2021-04-17 (×4): qty 2.5

## 2021-04-17 MED ORDER — LACTATED RINGERS IV SOLN: INTRAVENOUS | Status: AC

## 2021-04-17 MED ORDER — DILTIAZEM HCL 25 MG/5ML IV SOLN
10.0000 mg | Freq: Once | INTRAVENOUS | Status: AC
  Administered 2021-04-17: 10 mg via INTRAVENOUS
  Filled 2021-04-17: qty 5

## 2021-04-17 MED ORDER — INSULIN ASPART 100 UNIT/ML IJ SOLN
0.0000 [IU] | Freq: Every day | INTRAMUSCULAR | Status: DC
  Administered 2021-04-18: 4 [IU] via SUBCUTANEOUS
  Filled 2021-04-17: qty 0.05

## 2021-04-17 MED ORDER — INSULIN ASPART 100 UNIT/ML IJ SOLN
5.0000 [IU] | Freq: Once | INTRAMUSCULAR | Status: AC
  Administered 2021-04-17: 5 [IU] via SUBCUTANEOUS
  Filled 2021-04-17: qty 0.05

## 2021-04-17 MED ORDER — LEVALBUTEROL HCL 0.63 MG/3ML IN NEBU
0.6300 mg | INHALATION_SOLUTION | Freq: Once | RESPIRATORY_TRACT | Status: AC
  Administered 2021-04-17: 0.63 mg via RESPIRATORY_TRACT
  Filled 2021-04-17: qty 3

## 2021-04-17 NOTE — ED Notes (Signed)
Per md, pt was satting 88%, pt was placed on 2L O2 via .

## 2021-04-17 NOTE — ED Provider Notes (Addendum)
[facility] DEPT Provider Note   CSN: [id number] Arrival date & time: 04/17/21  1634     History  Chief Complaint  Patient presents with   Respiratory Distress    Sabrina [name] is a 88 y.o. female history of diastolic heart failure, A-fib on Cardizem and Eliquis, here presenting with shortness of breath.  Patient is demented unable to give much history.  Per the caregiver, patient has worsening shortness of breath around 2 PM today.  She was noted to have audible wheezing and retractions as well.  EMS was called and she was noted to be tachypneic and has abdominal breathing.  Patient was given 2 albuterol treatments and 1 Atrovent.  Also given Solu-Medrol prior to arrival.  Patient was noted to be tachycardic rate of 150s.  The history is provided by the EMS personnel and a caregiver.      Home Medications Prior to Admission medications   Medication Sig Start Date End Date Taking? Authorizing Provider  acetaminophen (TYLENOL) 500 MG tablet Take 500 mg by mouth daily as needed for fever or moderate pain (for foot pain).    [provider]  albuterol (VENTOLIN HFA) 108 (90 Base) MCG/ACT inhaler Inhale 1-2 puffs into the lungs every 6 (six) hours as needed for wheezing or shortness of breath. 03/22/19   [provider]  allopurinol (ZYLOPRIM) 100 MG tablet Take 100 mg by mouth daily. 01/17/16   [provider]  benzonatate (TESSALON) 100 MG capsule Take 1 capsule (100 mg total) by mouth every 8 (eight) hours. 03/04/21   Domenic Moras, PA-C  cholecalciferol (VITAMIN D) 1000 UNITS tablet Take 1,000 Units by mouth daily.    [provider]  cloNIDine (CATAPRES) 0.2 MG tablet Take 0.2 mg by mouth 2 (two) times daily.    [provider]  diltiazem (CARDIZEM) 60 MG tablet Take 1 tablet (60 mg total) by mouth every 8 (eight) hours. 02/13/21 03/30/21  Arrien, Jimmy Picket, MD  ELIQUIS 2.5 MG TABS tablet TAKE 1 TABLET BY  MOUTH TWICE DAILY. Patient taking differently: Take 2.5 mg by mouth 2 (two) times daily. 07/04/20   Fenton, Clint R, PA  famotidine (PEPCID) 20 MG tablet Take 20 mg by mouth daily. 11/09/19   [provider]  furosemide (LASIX) 40 MG tablet Take 40 mg by mouth daily. Per Dr. If over 166lb take 80mg  a day    [provider]  glipiZIDE (GLUCOTROL) 5 MG tablet Take 1 tablet (5 mg total) by mouth See admin instructions. Takes 1 tab every am and 1/2 tab every evening 04/02/21   Georgette Shell, MD  ipratropium-albuterol (DUONEB) 0.5-2.5 (3) MG/3ML SOLN Take 3 mLs by nebulization every 6 (six) hours as needed (wheezing and shortness of breath). 02/13/21   Arrien, Jimmy Picket, MD  isosorbide mononitrate (IMDUR) 30 MG 24 hr tablet Take 1 tablet (30 mg total) by mouth daily. 12/22/20 02/07/22  Pokhrel, Corrie Mckusick, MD  nystatin (MYCOSTATIN) 100000 UNIT/ML suspension Use as directed 5 mLs in the mouth or throat 2 (two) times daily. 03/22/21   [provider]  ondansetron (ZOFRAN) 4 MG tablet Take 1 tablet (4 mg total) by mouth every 6 (six) hours as needed for nausea. 04/02/21   Georgette Shell, MD  polyethylene glycol (MIRALAX / GLYCOLAX) 17 g packet Take 17 g by mouth daily.    [provider]  senna-docusate (SENOKOT-S) 8.6-50 MG tablet Take 1 tablet by mouth at bedtime as needed for mild constipation.  Patient not taking: Reported on 03/30/2021 03/15/21   Donne Hazel, MD  TRUE METRIX BLOOD GLUCOSE TEST test strip  07/19/19   [provider]  TRUEplus Lancets 28G MISC Apply topically. 08/20/19   [provider]      Allergies    Beta adrenergic blockers    Review of Systems   Review of Systems  Respiratory:  Positive for shortness of breath.   All other systems reviewed and are negative.  Physical Exam Updated Vital Signs BP (!) 142/88    Pulse (!) 133    Temp 98.1 F (36.7 C) (Oral)    Resp (!) 25    Ht 4\' 11"  (1.499 m)    Wt 72.6 kg    SpO2  95%    BMI 32.33 kg/m  Physical Exam Vitals and nursing note reviewed.  Constitutional:      Comments: Tachypneic, moderate distress  HENT:     Head: Normocephalic.     Nose: Nose normal.     Mouth/Throat:     Mouth: Mucous membranes are dry.  Eyes:     Extraocular Movements: Extraocular movements intact.     Pupils: Pupils are equal, round, and reactive to light.  Cardiovascular:     Rate and Rhythm: Tachycardia present. Rhythm irregular.  Pulmonary:     Comments: Tachypneic, diffuse wheezing Abdominal:     General: Abdomen is flat.     Palpations: Abdomen is soft.  Genitourinary:    Comments: Indwelling Foley in place Musculoskeletal:        General: Normal range of motion.     Cervical back: Normal range of motion and neck supple.  Skin:    General: Skin is warm.  Neurological:     Comments: Demented, moving all extremities  Psychiatric:     Comments: Unable     ED Results / Procedures / Treatments   Labs (all labs ordered are listed, but only abnormal results are displayed) Labs Reviewed  CBC WITH DIFFERENTIAL/PLATELET - Abnormal; Notable for the following components:      Result Value   WBC 18.6 (*)    RBC 2.96 (*)    Hemoglobin 9.3 (*)    HCT 29.5 (*)    Neutro Abs 14.0 (*)    Monocytes Absolute 1.3 (*)    Abs Immature Granulocytes 0.09 (*)    All other components within normal limits  COMPREHENSIVE METABOLIC PANEL - Abnormal; Notable for the following components:   Sodium 130 (*)    Chloride 87 (*)    Glucose, Bld 412 (*)    BUN 81 (*)    Creatinine, Ser 2.47 (*)    Calcium 11.5 (*)    AST 14 (*)    GFR, Estimated 18 (*)    All other components within normal limits  BRAIN NATRIURETIC PEPTIDE - Abnormal; Notable for the following components:   B Natriuretic Peptide 257.1 (*)    All other components within normal limits  BLOOD GAS, VENOUS - Abnormal; Notable for the following components:   pCO2, Ven 67 (*)    pO2, Ven 81.0 (*)    Bicarbonate 34.5  (*)    Acid-Base Excess 6.1 (*)    All other components within normal limits  I-STAT CHEM 8, ED - Abnormal; Notable for the following components:   Sodium 129 (*)    Chloride 90 (*)    BUN 85 (*)    Creatinine, Ser 2.50 (*)    Glucose, Bld 412 (*)  TCO2 35 (*)    Hemoglobin 10.2 (*)    HCT 30.0 (*)    All other components within normal limits  TROPONIN I (HIGH SENSITIVITY) - Abnormal; Notable for the following components:   Troponin I (High Sensitivity) 39 (*)    All other components within normal limits  TROPONIN I (HIGH SENSITIVITY)    EKG EKG Interpretation  Date/Time:  Tuesday April 17 2021 16:46:29 EST Ventricular Rate:  145 PR Interval:    QRS Duration: 97 QT Interval:  286 QTC Calculation: 445 R Axis:   62 Text Interpretation: Atrial fibrillation with rapid V-rate Ventricular premature complex Probable LVH with secondary repol abnrm ST depression, probably rate related Since last tracing rate faster Confirmed by Wandra Arthurs [phone]) on 04/17/2021 5:40:51 PM  Radiology DG Chest Port 1 View  Result Date: 04/17/2021 CLINICAL DATA:  Short of breath.  Respiratory distress EXAM: PORTABLE CHEST 1 VIEW COMPARISON:  03/29/2021 FINDINGS: Heart size and vascularity normal. Improved aeration in the right lung base which is now clear. Left lower lobe remains clear. Heart size normal. Cardiac loop recorder noted. Surgical clips right axilla. IMPRESSION: Interval improvement in right lower lobe airspace disease and right effusion. Lungs now clear. Electronically Signed   By: Franchot Gallo M.D.   On: 04/17/2021 17:34    Procedures Procedures    CRITICAL CARE Performed by: Wandra Arthurs   Total critical care time: 30 minutes  Critical care time was exclusive of separately billable procedures and treating other patients.  Critical care was necessary to treat or prevent imminent or life-threatening deterioration.  Critical care was time spent personally by me on the following  activities: development of treatment plan with patient and/or surrogate as well as nursing, discussions with consultants, evaluation of patient's response to treatment, examination of patient, obtaining history from patient or surrogate, ordering and performing treatments and interventions, ordering and review of laboratory studies, ordering and review of radiographic studies, pulse oximetry and re-evaluation of patient's condition.   Medications Ordered in ED Medications  diltiazem (CARDIZEM) 125 mg in dextrose 5% 125 mL (1 mg/mL) infusion (15 mg/hr Intravenous Rate/Dose Change 04/17/21 1850)  magnesium sulfate IVPB 2 g 50 mL (has no administration in time range)  sodium chloride 0.9 % bolus 500 mL (has no administration in time range)  diltiazem (CARDIZEM) injection 10 mg (10 mg Intravenous Given 04/17/21 1702)  insulin aspart (novoLOG) injection 5 Units (5 Units Subcutaneous Given 04/17/21 1758)  levalbuterol (XOPENEX) nebulizer solution 0.63 mg (0.63 mg Nebulization Given 04/17/21 1902)    ED Course/ Medical Decision Making/ A&P                           Medical Decision Making Aleshia Sina Sumpter is a 88 y.o. female who presented with shortness of breath.  Patient has audible wheezing on exam.  Patient received Solu-Medrol and albuterol prior to arrival.  Concern for possible COPD versus CHF exacerbation.  Plan to get CBC and CMP and BNP and chest x-ray.  We will also get a VBG and COVID test as well.  We will give Cardizem  7:05 PM Patient's heart rate remains elevated after Cardizem bolus.  Patient is now on the Cardizem drip now.  Her heart rate is improved to the 130s.  Given that she is DNR, I do not think cardioversion is indicated. Patient also has AKI.  Clinically she is slightly dehydrated her BNP is only 250.  I ordered  500 cc bolus.  At this point hospitalist to admit for COPD, AKI, rapid afib   Problems Addressed: AKI (acute kidney injury) [facility]): acute illness or injury Atrial  fibrillation with RVR ([place]): acute illness or injury COPD exacerbation ([place]): chronic illness or injury with exacerbation, progression, or side effects of treatment  Amount and/or Complexity of Data Reviewed Independent Historian: caregiver External Data Reviewed: notes. Labs: ordered. Decision-making details documented in ED Course. Radiology: ordered and independent interpretation performed. Decision-making details documented in ED Course. ECG/medicine tests: ordered and independent interpretation performed. Decision-making details documented in ED Course.  Risk Prescription drug management. Decision regarding hospitalization.  Final Clinical Impression(s) / ED Diagnoses Final diagnoses:  COPD exacerbation ([place])  AKI (acute kidney injury) ([place])  Atrial fibrillation with RVR [facility])    Rx / DC Orders ED Discharge Orders     None         Drenda Freeze, MD 04/17/21 Darlin Drop    Drenda Freeze, MD 04/17/21 [phone]

## 2021-04-17 NOTE — ED Notes (Signed)
Pt has removed simple mask, pt satting 96% on room air.

## 2021-04-17 NOTE — H&P (Addendum)
History and Physical    PLEASE NOTE THAT DRAGON DICT[employer] SOFTWARE WAS USED IN THE CONSTRUCTION OF THIS NOTE.   [address] [name] JXB:[id number] DOB: 07-02-1926 DOA: 04/17/2021  PCP: Sabrina [name], Sabrina [name]  Patient coming from: home   I have personally briefly reviewed patient's old medical records in [place]  Chief Complaint: Shortness of breath  HPI: Sabrina [name] is a 88 y.o. female with medical history significant for dementia, stage IV chronic kidney disease with baseline creatinine 2-2.5, hypertension, type 2 diabetes mellitus, chronic diastolic heart failure, paroxysmal atrial fibrillation chronically anticoagulated on Eliquis, COPD, moderate aortic stenosis, chronic anemia with baseline hemoglobin 8-9, who is admitted to [facility] on 04/17/2021 with acute COPD exacerbation after presenting from home to [facility] ED complaining of shortness of breath.   In the context of the patient's advanced dementia and associated limited ability to contribute to the history, the following history is provided by the patient's and caregiver as well as my discussions with the EDP and via chart review.  The patient reportedly has been experiencing 1 day of progressive shortness of breath with new onset wheezing.  Not associate with any reported chest pain, nausea, vomiting, diarrhea.  No recent trauma or travel.  No worsening of peripheral edema.  Caregiver conveys that the patient has demonstrated significant decline in her oral intake over the last few days.No reported fevers at home.  She was recently hospitalized in the [facility] system, admitted in January 2023 for right lower lobe pneumonia.  She has a documented history of COPD, without any known baseline supplemental oxygen requirements.  She also has a documented history of chronic diastolic heart failure, most recent echocardiogram on 03/10/2021 notable for LVEF 66 5%, no focal wall motion abnormalities, while demonstrating aortic valve  area by VTI of 1.04 cm2 consistent with moderate aortic stenosis.  She also has a history of paroxysmal atrial fibrillation for which she is chronically anticoagulated on Eliquis.  Additionally, she has history of stage IV chronic kidney disease with baseline creatinine 2-2.5.    In the setting of progressive shortness of breath throughout the day, EMS contacted, and provided solumedrol in route to [facility] long emergency department for further evaluation and management thereof.    ED Course:  Vital signs in the ED were notable for the following: Temperature max 98.1; presentation, found to be in atrial fibrillation with RVR, with initial Sabrina [name]'s in the 140s, subsequent improving into the 130s following initiation of diltiazem drip, as further detailed below; initial blood pressure 186/70, most recently 142/88; initial respiratory rate 36, which is decreased into the mid 20s following interval nebulizer treatments as well as initiation of IV magnesium, as quantified below; initial oxygen saturation 88% on room air, with subsequent improvement to 95% on 2 L nasal cannula.  Labs were notable for the following: Presenting VBG 7.32/67.  CMP notable for the following: Sodium 130, which corrects to approximately 135 when taking into account concomitant hyperglycemia, chloride 87, bicarbonate 32 creatinine 2.47, BUN to creatinine ratio greater than 20, glucose 412, calcium corrected for mild hypoalbuminemia noted to be 11.9 otherwise, liver enzymes within normal limits.  CBC notable for the following: Lipid cell count 18,600, hemoglobin 9.3 compared to most recent prior value of 8.4 on 03/06/2021, platelet count 314.  COVID-19/[place]za PCR results currently pending.  BNP 257 compared to 248 on 03/29/2021 and 426 on 03/09/2021; high-sensitivity troponin I 39, down from most recent prior value of 169 on 03/10/2021.  Imaging and additional  notable ED work-up: EKG shows atrial fibrillation with RVR, ventricular rate 145, ST  depression in inferior leads likely rate related, while showing no evidence of ST elevation.  Chest x-ray, in comparison to most recent prior from 03/29/2021 shows interval resolution of right lower lobe airspace disease as well as interval resolution of right pleural effusion, with bilateral lungs now clear.   While in the ED, the following were administered: Diltiazem 10 mg IV x1 followed by initiation of diltiazem drip, NovoLog 5 units subcu x1 Xopenex/ipratropium nebulizer treatment x1, magnesium symmetric arms IV over 2 hours x 1 dose, and normal saline x500 cc bolus.  Subsequently, the patient was admitted to the stepdown unit for further evaluation and management of presenting acute COPD exacerbation with resultant atrial fibrillation with RVR, complicated by acute hypoxic respiratory distress and suspected mild dehydration with evidence of acute prerenal azotemia, hypercalcemia.    Review of Systems: As per HPI otherwise 10 point review of systems negative.   Past Medical History:  Diagnosis Date   Arthritis    "mostly in my hands, lower back" (06/25/2017)   Basal cell carcinoma (BCC) of face 1983   Breast cancer, right breast ([place]) 03/2013   Chronic kidney disease (CKD), stage III (moderate) ([place])    nephrologist, Dr. [name]   Dental crowns present    DVT (deep venous thrombosis) ([place]) ~ 06/2013   "? side"   Gout    "on daily RX" (06/25/2017)   Heart murmur    no known problems; states did not know she had murmur until age 33   High cholesterol    Hypertension    fluctuates, especially when stressed; has been on med. > 20 yr.   Immature cataract    Non-insulin dependent type 2 diabetes mellitus ([place])    Personal history of chemotherapy    Personal history of radiation therapy    Pulmonary embolism ([place]) ~ 06/2013   Radiation 09/06/13-10/20/13   Right Breast Cancer   Stroke ([place]) 05/2017   just visual problems since (06/25/2017)   Wears partial dentures    lower     Past Surgical History:  Procedure Laterality Date   AXILLARY LYMPH NODE DISSECTION Right 03/30/2013   Procedure: AXILLARY LYMPH NODE DISSECTION;  Surgeon: Rolm Bookbinder, Sabrina [name];  Location: [place];  Service: General;  Laterality: Right;   BASAL CELL CARCINOMA EXCISION  1983   "face"   BREAST BIOPSY Right 03/17/2013   BREAST CYST EXCISION Right 11/1958   benign   BREAST LUMPECTOMY Right 03/30/2013   BREAST LUMPECTOMY WITH NEEDLE LOCALIZATION Right 03/30/2013   Procedure: BREAST LUMPECTOMY WITH NEEDLE LOCALIZATION;  Surgeon: Rolm Bookbinder, Sabrina [name];  Location: [place];  Service: General;  Laterality: Right;   DILATION AND CURETTAGE OF UTERUS     LOOP RECORDER INSERTION N/A 08/15/2017   Procedure: LOOP RECORDER INSERTION;  Surgeon: Evans Lance, Sabrina [name];  Location: [place] [facility];  Service: Cardiovascular;  Laterality: N/A;   PORT-A-CATH REMOVAL  2016   PORTACATH PLACEMENT N/A 04/15/2013   Procedure: INSERTION PORT-A-CATH;  Surgeon: Rolm Bookbinder, Sabrina [name];  Location: [place];  Service: General;  Laterality: N/A;   RE-EXCISION OF BREAST CANCER,SUPERIOR MARGINS Right 04/15/2013   Procedure: RE-EXCISION OF RIGHT BREAST  MARGINS;  Surgeon: Rolm Bookbinder, Sabrina [name];  Location: [place];  Service: General;  Laterality: Right;   TONSILLECTOMY  ~ 1935/1936    Social History:  reports that she has never smoked. She has never used smokeless tobacco. She  reports that she does not currently use alcohol after a past usage of about 3.0 standard drinks per week. She reports that she does not use drugs.   Allergies  Allergen Reactions   Beta Adrenergic Blockers Other (See Comments)    Fatigue    Family History  Problem Relation Age of Onset   Pneumonia Mother    Heart attack Father    Breast cancer Other 52       niece   Breast cancer Other 53       niece   Ovarian cancer Other        niece    Family history reviewed and not pertinent    Prior to Admission  medications   Medication Sig Start Date End Date Taking? Authorizing Provider  acetaminophen (TYLENOL) 500 MG tablet Take 500 mg by mouth daily as needed for fever or moderate pain (for foot pain).    Provider, Historical, Sabrina [name]  albuterol (VENTOLIN HFA) 108 (90 Base) MCG/ACT inhaler Inhale 1-2 puffs into the lungs every 6 (six) hours as needed for wheezing or shortness of breath. 03/22/19   Provider, Historical, Sabrina [name]  allopurinol (ZYLOPRIM) 100 MG tablet Take 100 mg by mouth daily. 01/17/16   Provider, Historical, Sabrina [name]  benzonatate (TESSALON) 100 MG capsule Take 1 capsule (100 mg total) by mouth every 8 (eight) hours. 03/04/21   Domenic Moras, PA-C  cholecalciferol (VITAMIN D) 1000 UNITS tablet Take 1,000 Units by mouth daily.    Provider, Historical, Sabrina [name]  cloNIDine (CATAPRES) 0.2 MG tablet Take 0.2 mg by mouth 2 (two) times daily.    Provider, Historical, Sabrina [name]  diltiazem (CARDIZEM) 60 MG tablet Take 1 tablet (60 mg total) by mouth every 8 (eight) hours. 02/13/21 03/30/21  Arrien, Jimmy Picket, Sabrina [name]  ELIQUIS 2.5 MG TABS tablet TAKE 1 TABLET BY MOUTH TWICE DAILY. Patient taking differently: Take 2.5 mg by mouth 2 (two) times daily. 07/04/20   Fenton, Clint R, PA  famotidine (PEPCID) 20 MG tablet Take 20 mg by mouth daily. 11/09/19   Provider, Historical, Sabrina [name]  furosemide (LASIX) 40 MG tablet Take 40 mg by mouth daily. Per Dr. If over 166lb take 84m a day    Provider, Historical, Sabrina [name]  glipiZIDE (GLUCOTROL) 5 MG tablet Take 1 tablet (5 mg total) by mouth See admin instructions. Takes 1 tab every am and 1/2 tab every evening 04/02/21   MGeorgette Shell Sabrina [name]  ipratropium-albuterol (DUONEB) 0.5-2.5 (3) MG/3ML SOLN Take 3 mLs by nebulization every 6 (six) hours as needed (wheezing and shortness of breath). 02/13/21   Arrien, MJimmy Picket Sabrina [name]  isosorbide mononitrate (IMDUR) 30 MG 24 hr tablet Take 1 tablet (30 mg total) by mouth daily. 12/22/20 02/07/22  Pokhrel, LCorrie Mckusick Sabrina [name]  nystatin (MYCOSTATIN) 100000 UNIT/ML suspension  Use as directed 5 mLs in the mouth or throat 2 (two) times daily. 03/22/21   Provider, Historical, Sabrina [name]  ondansetron (ZOFRAN) 4 MG tablet Take 1 tablet (4 mg total) by mouth every 6 (six) hours as needed for nausea. 04/02/21   MGeorgette Shell Sabrina [name]  polyethylene glycol (MIRALAX / GLYCOLAX) 17 g packet Take 17 g by mouth daily.    Provider, Historical, Sabrina [name]  senna-docusate (SENOKOT-S) 8.6-50 MG tablet Take 1 tablet by mouth at bedtime as needed for mild constipation. Patient not taking: Reported on 03/30/2021 03/15/21   CDonne Hazel Sabrina [name]  TRUE METRIX BLOOD GLUCOSE TEST test strip  07/19/19   Provider, Historical, Sabrina [name]  TRUEplus Lancets 28G MISC Apply topically. 08/20/19  Provider, Historical, Sabrina [name]     Objective    Physical Exam: Vitals:   04/17/21 1945 04/17/21 2000 04/17/21 2009 04/17/21 2015  BP: (!) 140/56 (!) 130/45  (!) 126/48  Pulse: (!) 129 73 79 86  Resp: (!) 26 (!) 29 (!) 26 18  Temp:   98.9 F (37.2 C)   TempSrc:   Axillary   SpO2: 97% 99% 100% 100%  Weight:      Height:        General: appears to be stated age; alert; baseline confusion Skin: warm, dry, no rash Head:  AT/[place] Mouth:  Oral mucosa membranes appear dry, normal dentition Neck: supple; trachea midline Heart:  RRR; did not appreciate any M/R/G Lungs: Mild bilateral expiratory wheezes noted, otherwise, CTAB, did not appreciate any rales, or rhonchi Abdomen: + BS; soft, ND, NT Vascular: 2+ pedal pulses b/l; 2+ radial pulses b/l Extremities: no peripheral edema, no muscle wasting Neuro: strength and sensation intact in upper and lower extremities b/l    Labs on Admission: I have personally reviewed following labs and imaging studies  CBC: Recent Labs  Lab 04/17/21 1643 04/17/21 1704  WBC 18.6*  --   NEUTROABS 14.0*  --   HGB 9.3* 10.2*  HCT 29.5* 30.0*  MCV 99.7  --   PLT 314  --    Basic Metabolic Panel: Recent Labs  Lab 04/17/21 1643 04/17/21 1704  NA 130* 129*  K 4.5 4.6  CL 87* 90*  CO2 32   --   GLUCOSE 412* 412*  BUN 81* 85*  CREATININE 2.47* 2.50*  CALCIUM 11.5*  --   MG 2.2  --    GFR: Estimated Creatinine Clearance: 12.5 mL/min (A) (by C-G formula based on SCr of 2.5 mg/dL (H)). Liver Function Tests: Recent Labs  Lab 04/17/21 1643  AST 14*  ALT 12  ALKPHOS 79  BILITOT 0.5  PROT 7.3  ALBUMIN 3.6   No results for input(s): LIPASE, AMYLASE in the last 168 hours. No results for input(s): AMMONIA in the last 168 hours. Coagulation Profile: No results for input(s): INR, PROTIME in the last 168 hours. Cardiac Enzymes: No results for input(s): CKTOTAL, CKMB, CKMBINDEX, TROPONINI in the last 168 hours. BNP (last 3 results) No results for input(s): PROBNP in the last 8760 hours. HbA1C: No results for input(s): HGBA1C in the last 72 hours. CBG: No results for input(s): GLUCAP in the last 168 hours. Lipid Profile: No results for input(s): CHOL, HDL, LDLCALC, TRIG, CHOLHDL, LDLDIRECT in the last 72 hours. Thyroid Function Tests: No results for input(s): TSH, T4TOTAL, FREET4, T3FREE, THYROIDAB in the last 72 hours. Anemia Panel: No results for input(s): VITAMINB12, FOLATE, FERRITIN, TIBC, IRON, RETICCTPCT in the last 72 hours. Urine analysis:    Component Value Date/Time   COLORURINE YELLOW 02/07/2021 2200   APPEARANCEUR CLEAR 02/07/2021 2200   LABSPEC <=1.005 02/07/2021 2200   PHURINE 6.0 02/07/2021 2200   GLUCOSEU 100 (A) 02/07/2021 2200   HGBUR NEGATIVE 02/07/2021 2200   BILIRUBINUR NEGATIVE 02/07/2021 2200   KETONESUR NEGATIVE 02/07/2021 2200   PROTEINUR NEGATIVE 02/07/2021 2200   UROBILINOGEN 0.2 09/13/2013 0523   NITRITE NEGATIVE 02/07/2021 2200   LEUKOCYTESUR SMALL (A) 02/07/2021 2200    Radiological Exams on Admission: DG Chest Port 1 View  Result Date: 04/17/2021 CLINICAL DATA:  Short of breath.  Respiratory distress EXAM: PORTABLE CHEST 1 VIEW COMPARISON:  03/29/2021 FINDINGS: Heart size and vascularity normal. Improved aeration in the right  lung base which is now  clear. Left lower lobe remains clear. Heart size normal. Cardiac loop recorder noted. Surgical clips right axilla. IMPRESSION: Interval improvement in right lower lobe airspace disease and right effusion. Lungs now clear. Electronically Signed   By: Franchot Gallo M.D.   On: 04/17/2021 17:34     EKG: Independently reviewed, with result as described above.    Assessment/Plan    Principal Problem:   Acute exacerbation of chronic obstructive pulmonary disease (COPD) (HCC) Active Problems:   Chronic diastolic congestive heart failure (HCC)   CKD (chronic kidney disease), stage IV (HCC)   Hypercalcemia   Paroxysmal atrial fibrillation with RVR (HCC)   Dehydration   Leukocytosis     #) Acute COPD exacerbation: in the context of a documented history of COPD, diagnosis of acute exacerbation on the basis of presenting progressive shortness of breath with expiratory wheezes, associated with acute hypoxic resp distress, as further quantified below, with presenting CXR showing no evidence of acute cardiopulmonary process, including interval resolution of previously noted right lower lobe airspace disease and corresponding right pleural effusion. no clinical or radiographic evidence to suggest acutely decompensated heart failure at this time. COVID-19/influenza PCR pending. Will add-on procalcitonin to further eval for any underlying pna. Outpatient respiratory regimen appears to include prn albuterol. Not currently a smoker.  In the setting of concomitant atrial fibrillation with RVR, will use Xopenex as preferred beta-2 agonist in order to reduce risks of further exacerbation of tachycardia due to aggressive related to agonism can be associated with albuterol.  Of note, presenting VBG shows compensated hypercapnia, suggestive of CO2 retention.    Plan: monitor continuous pulse oxymetry. Monitor on telemetry. Solumedrol. Scheduled xopenex/ipratropium nebulizer treatments q6  hours. Prn Xopenex nebulizer. BMP in the morning. Repeat CBC in the morning. Check serum Mg and Phos levels.  Repeat blood gas in the morning.  Add on procalcitonin.  Following for result of COVID-19/influenza PCR.  Further evaluation and management of acute hypoxic respiratory distress, as further detailed below.       #) Acute hypoxic respiratory distress: in the context of acute respiratory symptoms and no known baseline supplemental O2 requirements, presenting O2 sat 88% room air, subsequent proving into the 90s on 2 L nasal cannula, thereby meeting criteria for acute hypoxic respiratory distress as opposed to acute hypoxic respiratory failure at this time. Appears to be on basis of acute COPD exacerbation.  As noted above, present chest x-ray shows no evidence of acute cardiopulmonary process, and demonstrates interval resolution of previously noted right lower lobe airspace disease as well as right pleural effusion.  No clinical or radiographic evidence to suggest acutely decompensated heart failure at this time, noting that presenting BNP is similar to prior, and down from the during the first week of January 2023.  Clinically, the patient appears more dehydrated, including with report via caregiver of recent decline in oral intake, as evidenced by acute prerenal azotemia, hypercalcemia and the presence of dry oral mucous membranes.  In terms of other considered etiologies, ACS appears less likely at this time in the absence of any recent CP, while presenting troponin of 39, represents a trend down from this recent prior value of 169 on 03/10/2021.  Presenting EKGatrial fibrillation with RVR with some evidence of ST depression in the inferior leads, which is suspected to be rate related.  We will pursue updated EKG with improving rate control to confirm this suspicion, and continue to trend serial troponin in the meantime.  Clinically, presentation is also less suggestive  of acute PE at this time  particularly given good compliance with chronic anticoagulation on Eliquis.     Plan: further evaluation/management of presenting acute COPD exacerbation, as above including solumedrol as well as scheduled/as needed breathing treatments. Monitor continuous pulse ox with prn supplemental O2 to maintain O2 sats greater than or equal to 92%. monitor on telemetry. CMP/CBC in the AM. Check serum Mg and Phos levels.  Repeat blood gas, as above.  Follow-up result of COVID-19/influenza PCR.  Add on procalcitonin.  Trending troponin.        #) Atrial fibrillation with RVR in the context of document history of paroxysmal atrial fibrillation, chronically anticoagulated on Eliquis in the setting of CHA2DS2-VASc score of 8, noted to be in atrial fibrillation with RVR and associated ventricular rates in the 140s.  Started on diltiazem drip, into the 130s.  Blood pressure tolerating, without any associated hypotension.  Appears to be as a consequence of physiologic stress stemming from presenting acute COPD exacerbation, as above.  AV nodal blocking regimen consists of diltiazem 60 mg p.o. 3 times daily.    Plan: Monitor strict I's and O's and daily weights. Monitor on telemetry. Check serum magnesium level with prn supplementation to maintain levels of greater than or equal to 2.0. Repeat BMP in the AM.  Repeat CBC in the AM.  Continue diltiazem drip.  Continue Eliquis.  Further evaluation and management of presenting acute COPD exacerbation with consequential acute hypoxic respiratory distress.  Monitor continuous pulse oximetry.       #) Dehydration: Clinical suspicion for such, including the appearance of dry oral mucous membranes as well as laboratory findings notable for acute prerenal azotemia.  Presenting hypercalcemia also appears to suggest element of dehydration.  This is in the context of recent decline in oral intake, as above.  Particularly given history of moderate aortic stenosis borderline  severe with associated preload dependence, and no clinical/radiographic evidence to suggest acutely consider failure at this time, will provide gentle IV fluids, as detailed below.   Plan: Monitor strict I's and O's.  Daily weights.  Repeat BMP in the morning. IVF's in form of LR at 50 cc/h x 8 hours.         #) Hypercalcemia: Presenting labs reflect serum calcium, adjusted for hypoalbuminemia, of 11.9.  Suspect element of dehydration in the setting of reported recent decline in oral intake, with additional clinical evidence to suggest underlying hydration, without overt pharmacologic contribution. Will initiate gentle IVF's, as above, with repeat calcium level in the morning, with consideration for further expansion of work-up if no ensuing improvement in serum calcium level following interval IVF administration.     Plan: LR, as above.  Monitor strict I's&O's, daily weights.  CMP in the morning.  Ionized calcium.  Check serum Mg and Phos levels.         #) Elevated troponin: mildly elevated initial troponin of 39, which is actually trending down from most recent prior value of 169 on 03/10/2021.  We will to his presenting value may represent a continue to trend down relative to this measured prior value, it is also possible that the suspected supply/demand in the setting of diminished oxygen delivery capacity as consequence of presenting acute hypoxic respiratory distress, as above.  As described above, ACS appears less likely at this time, will continue to trend serial troponin values we will closely monitoring on telemetry, with plan for repeat EKG to resolution of mild ST depression noted in inferior leads, which at  this time, suspected to have been on the basis of rate related factors in the setting of presenting atrial fibrillation with RVR.   Plan: Continue to trend troponin. Monitor on telemetry.  Repeat EKG, as above check serum Mg level and repeat BMP in the morning, with prn  supplementation to maintain Mg and potassium levels greater than or equal to 2.0 and 4.0, respectively, to further reduce risk of ventricular arrhythmia. Repeat CBC in the AM. Additional evaluation and management of presenting acute COPD exacerbation leading to acute hypoxic respiratory distress as suspected driving force behind mildly elevated troponin, as above.  Should ensuing troponin demonstrate significant ischemic pain consider repeating to evaluate for interval focal wall motion abnormalities relative to most recent prior echocardiogram which was performed approximately 5 weeks ago on 03/10/2021, as further detailed above.      #) Leukocytosis: Mildly elevated white blood cell count 18,600, with potential contributions noninfectious sources, including hemoconcentration in the setting of clinical evidence of dehydration, as above as well as pharmacologic impacts of interval high dose solumedrol ministered via EMS.  No evidence of underlying factious process at this time, including interval resolution of previously noted pneumonia on chest x-ray, as above.  Of note, COVID-19/influenza PCR results pending at this time.  Also check urinalysis to further evaluate.  In the absence of suspected infection, criteria for sepsis not currently met.  Plan: IV fluids, as above.  Monitor strict I's and O's Daily weights.  Follow-up result of COVID-19/[place] PCR.  Check urinalysis.  Repeat CBC with differential in AM.  Add on procalcitonin.       #) Stage IV chronic kidney disease: Document history of such, baseline creatinine 2-2.5, with presenting serum creatinine found to be consistent with his baseline range.  Plan: Monitor strict I's and O's and weights.  Tempt avoid nephrotoxic agents.  Repeat BMP in the morning.          #) Type 2 Diabetes Mellitus: documented history of such. Home insulin regimen: None. Home oral hypoglycemic agents: Glipizide. presenting blood sugar: 412, without evidence of  anion gap and acidosis to suggest DKA.  Suspect attribution towards hyperglycemia from hemoconcentration in the setting of clinical evidence of dehydration, as above, with potential additional contribution from interval IV steroids.     Plan: accuchecks QAC and HS with low dose SSI. hold home oral hypoglycemic agents during this hospitalization.  IV fluids, as above.  Monitor strict I's and O's Daily weights.            #) Chronic diastolic heart failure: documented history of such, with most recent echocardiogram performed on 03/10/2021 notable for LVEF 60 to 65%. No clinical evidence to suggest acutely decompensated heart failure at this time, although it is noted that the patient is at increased risk for ensuing development of acutely end-stage heart failure given borderline severe aortic stenosis. home diuretic regimen reportedly consists of the following: Lasix 40 mg p.o. daily.  Clinically, the patient appears mildly dehydrated, warranting gentle IV fluids with close monitoring for interval development of acute volume overload.   Plan: monitor strict I's & O's and daily weights. Repeat BMP in AM. Check serum mag level.  Holding home diuretic regimen for now.       #) Chronic anemia: Documented history of stage, and the basis of anemia of chronic kidney disease with baseline hemoglobin range 8-9, presenting hemoglobin consistent with this range, and no evidence of active bleed at this time.  Plan: Repeat CBC in the morning.  DVT prophylaxis: SCD's plus Eliquis Code Status: DNR/DNI (confirmed per my discussions with the patient's POA, her daughter) Family Communication: Discussed the patient's case with her POA daughter), as well as with her granddaughter who was present at bedside Disposition Plan: Per Rounding Team Consults called: none;  Admission status: Inpatient   PLEASE NOTE THAT DRAGON DICTATION SOFTWARE WAS USED IN THE CONSTRUCTION OF THIS NOTE.   [place] DO [facility] [place]   04/17/2021, 8:40 PM

## 2021-04-17 NOTE — ED Triage Notes (Signed)
Pt to er room number 12, pt had neb in process, per ems pt is here for sob all day, states that she was satting in the 80, gave a neb and it came up to the 90s, states that she has gotten albuterol, atrovent and solumedrol.  Pt not answering questions, pt awake, pt has iv in R hand.

## 2021-04-18 ENCOUNTER — Other Ambulatory Visit: Payer: Self-pay

## 2021-04-18 DIAGNOSIS — J441 Chronic obstructive pulmonary disease with (acute) exacerbation: Principal | ICD-10-CM

## 2021-04-18 DIAGNOSIS — I480 Paroxysmal atrial fibrillation: Secondary | ICD-10-CM

## 2021-04-18 DIAGNOSIS — D631 Anemia in chronic kidney disease: Secondary | ICD-10-CM

## 2021-04-18 DIAGNOSIS — E871 Hypo-osmolality and hyponatremia: Secondary | ICD-10-CM

## 2021-04-18 DIAGNOSIS — E669 Obesity, unspecified: Secondary | ICD-10-CM

## 2021-04-18 LAB — CBC WITH DIFFERENTIAL/PLATELET
Abs Immature Granulocytes: 0.05 10*3/uL (ref 0.00–0.07)
Basophils Absolute: 0 10*3/uL (ref 0.0–0.1)
Basophils Relative: 0 %
Eosinophils Absolute: 0 10*3/uL (ref 0.0–0.5)
Eosinophils Relative: 0 %
HCT: 26 % — ABNORMAL LOW (ref 36.0–46.0)
Hemoglobin: 8.2 g/dL — ABNORMAL LOW (ref 12.0–15.0)
Immature Granulocytes: 0 %
Lymphocytes Relative: 10 %
Lymphs Abs: 1.3 10*3/uL (ref 0.7–4.0)
MCH: 31.2 pg (ref 26.0–34.0)
MCHC: 31.5 g/dL (ref 30.0–36.0)
MCV: 98.9 fL (ref 80.0–100.0)
Monocytes Absolute: 0.1 10*3/uL (ref 0.1–1.0)
Monocytes Relative: 1 %
Neutro Abs: 11.1 10*3/uL — ABNORMAL HIGH (ref 1.7–7.7)
Neutrophils Relative %: 89 %
Platelets: 235 10*3/uL (ref 150–400)
RBC: 2.63 MIL/uL — ABNORMAL LOW (ref 3.87–5.11)
RDW: 15 % (ref 11.5–15.5)
WBC: 12.6 10*3/uL — ABNORMAL HIGH (ref 4.0–10.5)
nRBC: 0 % (ref 0.0–0.2)

## 2021-04-18 LAB — GLUCOSE, CAPILLARY
Glucose-Capillary: 390 mg/dL — ABNORMAL HIGH (ref 70–99)
Glucose-Capillary: 330 mg/dL — ABNORMAL HIGH (ref 70–99)
Glucose-Capillary: 316 mg/dL — ABNORMAL HIGH (ref 70–99)
Glucose-Capillary: 376 mg/dL — ABNORMAL HIGH (ref 70–99)
Glucose-Capillary: 340 mg/dL — ABNORMAL HIGH (ref 70–99)
Glucose-Capillary: 340 mg/dL — ABNORMAL HIGH (ref 70–99)

## 2021-04-18 LAB — URINALYSIS, COMPLETE (UACMP) WITH MICROSCOPIC
Bilirubin Urine: NEGATIVE
Glucose, UA: >=500 mg/dL — AB
Ketones, ur: NEGATIVE mg/dL
Nitrite: NEGATIVE
Protein, ur: 100 mg/dL — AB
Specific Gravity, Urine: 1.009 (ref 1.005–1.030)
pH: 5 (ref 5.0–8.0)

## 2021-04-18 LAB — COMPREHENSIVE METABOLIC PANEL
ALT: 11 U/L (ref 0–44)
AST: 14 U/L — ABNORMAL LOW (ref 15–41)
Albumin: 3 g/dL — ABNORMAL LOW (ref 3.5–5.0)
Alkaline Phosphatase: 70 U/L (ref 38–126)
Anion gap: 12 (ref 5–15)
BUN: 77 mg/dL — ABNORMAL HIGH (ref 8–23)
CO2: 29 mmol/L (ref 22–32)
Calcium: 11.1 mg/dL — ABNORMAL HIGH (ref 8.9–10.3)
Chloride: 89 mmol/L — ABNORMAL LOW (ref 98–111)
Creatinine, Ser: 2.31 mg/dL — ABNORMAL HIGH (ref 0.44–1.00)
GFR, Estimated: 19 mL/min — ABNORMAL LOW (ref >60)
Glucose, Bld: 422 mg/dL — ABNORMAL HIGH (ref 70–99)
Potassium: 4.4 mmol/L (ref 3.5–5.1)
Sodium: 130 mmol/L — ABNORMAL LOW (ref 135–145)
Total Bilirubin: 0.3 mg/dL (ref 0.3–1.2)
Total Protein: 6.6 g/dL (ref 6.5–8.1)

## 2021-04-18 LAB — BLOOD GAS, VENOUS
Acid-Base Excess: 7.4 mmol/L — ABNORMAL HIGH (ref 0.0–2.0)
Bicarbonate: 33.4 mmol/L — ABNORMAL HIGH (ref 20.0–28.0)
O2 Saturation: 72.3 %
Patient temperature: 37
pCO2, Ven: 54 mmHg (ref 44–60)
pH, Ven: 7.4 (ref 7.25–7.43)
pO2, Ven: 42 mmHg (ref 32–45)

## 2021-04-18 LAB — SODIUM, URINE, RANDOM: Sodium, Ur: 16 mmol/L

## 2021-04-18 LAB — TROPONIN I (HIGH SENSITIVITY)
Troponin I (High Sensitivity): 182 ng/L (ref <18)
Troponin I (High Sensitivity): 174 ng/L (ref <18)

## 2021-04-18 LAB — PHOSPHORUS: Phosphorus: 3.4 mg/dL (ref 2.5–4.6)

## 2021-04-18 LAB — MAGNESIUM: Magnesium: 2.4 mg/dL (ref 1.7–2.4)

## 2021-04-18 LAB — CREATININE, URINE, RANDOM: Creatinine, Urine: 36.22 mg/dL

## 2021-04-18 MED ORDER — DILTIAZEM HCL 60 MG PO TABS
60.0000 mg | ORAL_TABLET | Freq: Three times a day (TID) | ORAL | Status: DC
  Administered 2021-04-18 (×2): 60 mg via ORAL
  Filled 2021-04-18 (×6): qty 1

## 2021-04-18 MED ORDER — HYDRALAZINE HCL 25 MG PO TABS
25.0000 mg | ORAL_TABLET | Freq: Three times a day (TID) | ORAL | Status: DC
  Administered 2021-04-18: 25 mg via ORAL
  Filled 2021-04-18: qty 1

## 2021-04-18 MED ORDER — METHYLPREDNISOLONE SODIUM SUCC 40 MG IJ SOLR
40.0000 mg | Freq: Two times a day (BID) | INTRAMUSCULAR | Status: DC
  Administered 2021-04-18: 40 mg via INTRAVENOUS
  Filled 2021-04-18: qty 1

## 2021-04-18 MED ORDER — ENSURE ENLIVE PO LIQD
237.0000 mL | Freq: Two times a day (BID) | ORAL | Status: DC
  Administered 2021-04-18 (×2): 237 mL via ORAL

## 2021-04-18 MED ORDER — HYDRALAZINE HCL 25 MG PO TABS: 25.0000 mg | ORAL_TABLET | ORAL | Status: AC

## 2021-04-18 MED ORDER — APIXABAN 2.5 MG PO TABS
2.5000 mg | ORAL_TABLET | Freq: Two times a day (BID) | ORAL | Status: DC
  Administered 2021-04-18: 2.5 mg via ORAL
  Filled 2021-04-18 (×2): qty 1

## 2021-04-18 MED ORDER — SODIUM CHLORIDE 0.9 % IV SOLN
1.0000 g | INTRAVENOUS | Status: DC
  Administered 2021-04-18 – 2021-04-20 (×3): 1 g via INTRAVENOUS
  Filled 2021-04-18 (×3): qty 10

## 2021-04-18 MED ORDER — MORPHINE SULFATE (PF) 2 MG/ML IV SOLN
1.0000 mg | Freq: Once | INTRAVENOUS | Status: AC | PRN
  Administered 2021-04-18: 1 mg via INTRAVENOUS
  Filled 2021-04-18: qty 1

## 2021-04-18 MED ORDER — GLIPIZIDE 5 MG PO TABS
5.0000 mg | ORAL_TABLET | Freq: Every day | ORAL | Status: DC
  Administered 2021-04-18: 5 mg via ORAL
  Filled 2021-04-18 (×2): qty 1

## 2021-04-18 MED ORDER — CLONIDINE HCL 0.1 MG PO TABS
0.2000 mg | ORAL_TABLET | Freq: Two times a day (BID) | ORAL | Status: DC
  Administered 2021-04-18 (×2): 0.2 mg via ORAL
  Filled 2021-04-18 (×3): qty 2

## 2021-04-18 MED ORDER — BISACODYL 10 MG RE SUPP
10.0000 mg | Freq: Every day | RECTAL | Status: DC | PRN
  Administered 2021-04-18: 10 mg via RECTAL
  Filled 2021-04-18: qty 1

## 2021-04-18 MED ORDER — GLIPIZIDE 2.5 MG HALF TABLET
2.5000 mg | ORAL_TABLET | Freq: Every day | ORAL | Status: DC
  Administered 2021-04-18: 2.5 mg via ORAL
  Filled 2021-04-18: qty 1

## 2021-04-18 MED ORDER — POLYETHYLENE GLYCOL 3350 17 G PO PACK
17.0000 g | PACK | Freq: Every day | ORAL | Status: DC
  Administered 2021-04-18: 17 g via ORAL
  Filled 2021-04-18 (×2): qty 1

## 2021-04-18 MED ORDER — HYDRALAZINE HCL 25 MG PO TABS
25.0000 mg | ORAL_TABLET | Freq: Four times a day (QID) | ORAL | Status: DC | PRN
  Administered 2021-04-18: 25 mg via ORAL
  Filled 2021-04-18: qty 1

## 2021-04-18 MED ORDER — HYDRALAZINE HCL 50 MG PO TABS
50.0000 mg | ORAL_TABLET | Freq: Three times a day (TID) | ORAL | Status: DC
  Administered 2021-04-18: 50 mg via ORAL
  Filled 2021-04-18 (×2): qty 1

## 2021-04-18 NOTE — Assessment & Plan Note (Deleted)
Admitted on Cardizem drip, transition to p.o. Cardizem, rate improving continue anticoagulation with Eliquis

## 2021-04-18 NOTE — Assessment & Plan Note (Addendum)
Latest calcium 11.3.

## 2021-04-18 NOTE — Assessment & Plan Note (Addendum)
Latest sodium was 135.

## 2021-04-18 NOTE — Assessment & Plan Note (Signed)
Admitted on Cardizem drip, transition to p.o. Cardizem, rate improving continue anticoagulation with Eliquis

## 2021-04-18 NOTE — Assessment & Plan Note (Addendum)
On comfort care 

## 2021-04-18 NOTE — Progress Notes (Signed)
[facility] Hospitalist PROGRESS NOTE  [name]  LNL:[id number] DOB: 1926/03/10 DOA: 04/17/2021 PCP: [name], MD   Brief Narrative/Hospital Course: Sabrina [name], 88 y.o. female  44 F w / h/o dementia, stage IV chronic kidney disease with baseline creatinine 2-2.5, hypertension, type 2 diabetes mellitus, chronic diastolic heart failure, PAF on Eliquis, COPD, moderate aortic stenosis, chronic anemia with baseline hemoglobin 8-9 presented to ED 2/14 with shortness of breath with 1 day history of progressive worsening shortness of breath, new onset wheezing.  Recently hospitalized in January for right lower lobe pneumonia.  Patient W/ poor historrian w/ dementia. In the ED-lab showed leukocytosis BNP 257 stable high-sensitivity troponin 39> 95 previously 169, EKG with A-fib RVR, chest x-ray resolution of right lower lobe airspace disease-heart rate in 140s in aVR given Cardizem bolus and placed on drip IV fluids bronchodilators and admitted to stepdown for A-fib RVR, COPD exacerbation, acute hypoxic respiratory failure, mild dehydration, AKI hypercalcemia. Further lab with Pro-Cal 0.7 COVID-19 negative  Subjective: Seen and examined. DEMENTED- NORMALLY A0X12-,M CAREGIVER AT BEDSIDE On 2l [place] home setting BP high, mumbling words Low grade temp this am  Assessment and Plan: * Acute exacerbation of chronic obstructive pulmonary disease (COPD) ([place])- (present on admission) Chest x-ray with improvement recent pneumonia.  Presenting with respiratory distress hypoxia shortness of breath and wheezing.  COVID-19 and influenza PCR negative.  Procalcitonin elevated could be bronchitis add antibiotic/.  Not wheezing this morning decrease Solu-Medrol to 40 mg, cont bronchodilators as heart rate tolerates, supplemental oxygen, PT OT OOB  Acute respiratory failure with hypoxia ([place])- (present on admission) Initially needing up to 10 L nasal cannula.  Currently on 2 L.  Baseline.  Continue  supplemental oxygen continue treatment of underlying respiratory and cardiac issues.  Paroxysmal atrial fibrillation with RVR ([place])- (present on admission) Admitted on Cardizem drip, hopefully can transition Cardizem drip to p.o., cardiology consulted. Continue anticoagulation with Eliquis  Hypertensive urgency- (present on admission) BP poorly controlled.  Resume home hydralazine, on Cardizem.  Continue Imdur.  Cardiology on consult.  Atrial fibrillation with RVR ([place]) Admitted on Cardizem drip, transition to p.o. Cardizem, rate improving continue anticoagulation with Eliquis  Elevated troponin- (present on admission) Likely demand ischemia in the setting of A-fib RVR hypoxia.  Repeat troponin 174. Continue anticoagulation, Imdur, monitor.  Cardiology on consult  Leucocytosis Does have elevated procalcitonin will put on empiric antibiotics.  WBC downtrending.  Monitor Recent Labs  Lab 04/17/21 1643 04/17/21 2000 04/18/21 0251  WBC 18.6*  --  12.6*  PROCALCITON  --  0.73  --     Chronic diastolic congestive heart failure ([place])- (present on admission) Monitor fluid status intake output.  Compared to recent admissions weight has improved as below-Lasix on hold in the setting of AKI and dehydration Net IO Since Admission: -9.86 mL [04/18/21 1117]  Wt Readings from Last 3 Encounters:  04/17/21 70.6 kg  04/02/21 72.6 kg  03/14/21 75.1 kg    Acute renal failure superimposed on stage 4 chronic kidney disease ([place])- (present on admission) Recent baseline creatinine up to 1.7- mid 2s. Creatinine downtrending monitor watch for fluid overload Lasix on hold, encourage oral intake Recent Labs  Lab 04/17/21 1643 04/17/21 1704 04/18/21 0251  BUN 81* 85* 77*  CREATININE 2.47* 2.50* 2.31*    Hypercalcemia- (present on admission) Calcium 11.1.  Albumin 3-3.6.Monitor electrolytes-cont w/ hydration as tolerated.  Check PTH and vitamin D level Recent Labs  Lab 04/17/21 1643 04/17/21 1704  04/18/21 0251  K  4.5 4.6 4.4  CALCIUM 11.5*  --  11.1*  MG 2.2  --  2.4  PHOS  --   --  3.4    Dehydration- (present on admission) Hydration as tolerated in the setting of diastolic CHF.  Anemia due to chronic kidney disease Hemoglobin overall stable monitor patient on anticoagulation chronically.  History of previous GI bleeding. Recent Labs  Lab 04/17/21 1643 04/17/21 1704 04/18/21 0251  HGB 9.3* 10.2* 8.2*  HCT 29.5* 30.0* 26.0*    Obesity, Class I, BMI 30-34.9 Will benefit with PCP follow-up, weight loss-doubt this is feasible at current situation.  Hyponatremia With Dx on admission.  Monitor Recent Labs  Lab 04/17/21 1643 04/17/21 1704 04/18/21 0251  NA 130* 129* 130*    Goals of care, counseling/discussion Patient was recently enrolled in hospice at the facility.  Remains DNR.  Palliative care consulted to facilitate.    Type 2 diabetes mellitus with stage 4 chronic kidney disease ([place])- (present on admission) Uncontrolled hyperglycemia.  Recent A1c 8.8 in January 7.  Intensify insulin regimen patient on steroids so cont to monitor SSI.  Resume home glipizide, weaning down steroids. Recent Labs  Lab 04/17/21 2254 04/18/21 0558 04/18/21 0755  GLUCAP 447* 390* 330*    Aortic stenosis Need outpatient follow-up   DVT prophylaxis: apixaban (ELIQUIS) tablet 2.5 mg Start: 04/17/21 2200 SCDs Start: 04/17/21 1933 Code Status:   Code Status: DNR Family Communication: plan of care discussed with patient/caregiver aid at bedside. Disposition: Currently  not medically stable for discharge. Status is: Inpatient Remains inpatient appropriate because: Ongoing management of uncontrolled hypertension, A-fib  Objective: Vitals last 24 hrs: Vitals:   04/18/21 0800 04/18/21 0823 04/18/21 0900 04/18/21 1000  BP: (!) 225/60  (!) 198/52 (!) 181/47  Pulse: (!) 104  94 87  Resp: (!) 32  17 (!) 21  Temp:   98.6 F (37 C)   TempSrc:   Axillary   SpO2: 94% 94% 96% 94%   Weight:      Height:       Weight change:   Physical Examination:  General exam: AA0X0, MUMBLING words, old demented, o n2  HEENT:Oral mucosa moist, Ear/Nose WNL grossly, dentition normal. Respiratory system: bilaterally diminished, no wheezing, no use of accessory muscle Cardiovascular system: S1 & S2 +, No JVD,. Gastrointestinal system: Abdomen soft,NT,ND, BS+ Nervous System:Alert, awake, moving extremities and grossly nonfocal Extremities: LE edema none,distal peripheral pulses palpable.  Skin: No rashes,no icterus. MSK: Normal muscle bulk,tone, power  Medications reviewed:  Scheduled Meds:  apixaban  2.5 mg Oral BID   Chlorhexidine Gluconate Cloth  6 each Topical Daily   diltiazem  60 mg Oral Q8H   famotidine  20 mg Oral Daily   feeding supplement  237 mL Oral BID BM   glipiZIDE  2.5 mg Oral QAC supper   glipiZIDE  5 mg Oral QAC breakfast   hydrALAZINE  25 mg Oral Q8H   insulin aspart  0-5 Units Subcutaneous QHS   insulin aspart  0-9 Units Subcutaneous TID WC   ipratropium  0.5 mg Nebulization TID   isosorbide mononitrate  30 mg Oral Daily   levalbuterol  1.25 mg Nebulization TID   methylPREDNISolone (SOLU-MEDROL) injection  40 mg Intravenous Q12H   Continuous Infusions:  cefTRIAXone (ROCEPHIN)  IV Stopped (04/18/21 0941)      Diet Order             Diet regular Room service appropriate? Yes; Fluid consistency: Thin  Diet effective now  Intake/Output Summary (Last 24 hours) at 04/18/2021 1121 Last data filed at 04/18/2021 0841 Gross per 24 hour  Intake 990.14 ml  Output 1000 ml  Net -9.86 ml   Net IO Since Admission: -9.86 mL [04/18/21 1121]  Wt Readings from Last 3 Encounters:  04/17/21 70.6 kg  04/02/21 72.6 kg  03/14/21 75.1 kg     Unresulted Labs (From admission, onward)     Start     Ordered   04/19/21 2536  Basic metabolic panel  Daily,   R     Question:  Specimen collection method  Answer:  Lab=Lab collect    04/18/21 0801   04/19/21 0500  Brain natriuretic peptide  Tomorrow morning,   R       Question:  Specimen collection method  Answer:  Lab=Lab collect   04/18/21 0801   04/18/21 0500  Calcium, ionized  Tomorrow morning,   R        04/17/21 2036          Data Reviewed: I have personally reviewed following labs and imaging studies CBC: Recent Labs  Lab 04/17/21 1643 04/17/21 1704 04/18/21 0251  WBC 18.6*  --  12.6*  NEUTROABS 14.0*  --  11.1*  HGB 9.3* 10.2* 8.2*  HCT 29.5* 30.0* 26.0*  MCV 99.7  --  98.9  PLT 314  --  644   Basic Metabolic Panel: Recent Labs  Lab 04/17/21 1643 04/17/21 1704 04/18/21 0251  NA 130* 129* 130*  K 4.5 4.6 4.4  CL 87* 90* 89*  CO2 32  --  29  GLUCOSE 412* 412* 422*  BUN 81* 85* 77*  CREATININE 2.47* 2.50* 2.31*  CALCIUM 11.5*  --  11.1*  MG 2.2  --  2.4  PHOS  --   --  3.4   GFR: Estimated Creatinine Clearance: 13.3 mL/min (A) (by C-G formula based on SCr of 2.31 mg/dL (H)). Liver Function Tests: Recent Labs  Lab 04/17/21 1643 04/18/21 0251  AST 14* 14*  ALT 12 11  ALKPHOS 79 70  BILITOT 0.5 0.3  PROT 7.3 6.6  ALBUMIN 3.6 3.0*   No results for input(s): LIPASE, AMYLASE in the last 168 hours. No results for input(s): AMMONIA in the last 168 hours. Coagulation Profile: No results for input(s): INR, PROTIME in the last 168 hours. Cardiac Enzymes: No results for input(s): CKTOTAL, CKMB, CKMBINDEX, TROPONINI in the last 168 hours. BNP (last 3 results) No results for input(s): PROBNP in the last 8760 hours. HbA1C: No results for input(s): HGBA1C in the last 72 hours. CBG: Recent Labs  Lab 04/17/21 2254 04/18/21 0558 04/18/21 0755  GLUCAP 447* 390* 330*   Lipid Profile: No results for input(s): CHOL, HDL, LDLCALC, TRIG, CHOLHDL, LDLDIRECT in the last 72 hours. Thyroid Function Tests: No results for input(s): TSH, T4TOTAL, FREET4, T3FREE, THYROIDAB in the last 72 hours. Anemia Panel: No results for input(s): VITAMINB12,  FOLATE, FERRITIN, TIBC, IRON, RETICCTPCT in the last 72 hours. Sepsis Labs: Recent Labs  Lab 04/17/21 2000  PROCALCITON 0.73    Recent Results (from the past 240 hour(s))  Resp Panel by RT-PCR (Flu A&B, Covid) Nasopharyngeal Swab     Status: None   Collection Time: 04/17/21  8:00 PM   Specimen: Nasopharyngeal Swab; Nasopharyngeal(NP) swabs in vial transport medium  Result Value Ref Range Status   SARS Coronavirus 2 by RT PCR NEGATIVE NEGATIVE Final    Comment: (NOTE) SARS-CoV-2 target nucleic acids are NOT DETECTED.  The SARS-CoV-2 RNA is  generally detectable in upper respiratory specimens during the acute phase of infection. The lowest concentration of SARS-CoV-2 viral copies this assay can detect is 138 copies/mL. A negative result does not preclude SARS-Cov-2 infection and should not be used as the sole basis for treatment or other patient management decisions. A negative result may occur with  improper specimen collection/handling, submission of specimen other than nasopharyngeal swab, presence of viral mutation(s) within the areas targeted by this assay, and inadequate number of viral copies(<138 copies/mL). A negative result must be combined with clinical observations, patient history, and epidemiological information. The expected result is Negative.  Fact Sheet for Patients:  EntrepreneurPulse.com.au  Fact Sheet for Healthcare Providers:  IncredibleEmployment.be  This test is no t yet approved or cleared by the [place] FDA and  has been authorized for detection and/or diagnosis of SARS-CoV-2 by FDA under an Emergency Use Authorization (EUA). This EUA will remain  in effect (meaning this test can be used) for the duration of the COVID-19 declaration under Section 564(b)(1) of the Act, 21 U.S.C.section 360bbb-3(b)(1), unless the authorization is terminated  or revoked sooner.       Influenza A by PCR NEGATIVE NEGATIVE Final    Influenza B by PCR NEGATIVE NEGATIVE Final    Comment: (NOTE) The Xpert Xpress SARS-CoV-2/FLU/RSV plus assay is intended as an aid in the diagnosis of influenza from Nasopharyngeal swab specimens and should not be used as a sole basis for treatment. Nasal washings and aspirates are unacceptable for Xpert Xpress SARS-CoV-2/FLU/RSV testing.  Fact Sheet for Patients: EntrepreneurPulse.com.au  Fact Sheet for Healthcare Providers: IncredibleEmployment.be  This test is not yet approved or cleared by the [place] FDA and has been authorized for detection and/or diagnosis of SARS-CoV-2 by FDA under an Emergency Use Authorization (EUA). This EUA will remain in effect (meaning this test can be used) for the duration of the COVID-19 declaration under Section 564(b)(1) of the Act, 21 U.S.C. section 360bbb-3(b)(1), unless the authorization is terminated or revoked.  Performed at [facility], [place] [address] [place], [place] [address]   MRSA Next Gen by PCR, Nasal     Status: None   Collection Time: 04/17/21 10:33 PM   Specimen: Nasal Mucosa; Nasal Swab  Result Value Ref Range Status   MRSA by PCR Next Gen NOT DETECTED NOT DETECTED Final    Comment: (NOTE) The GeneXpert MRSA Assay (FDA approved for NASAL specimens only), is one component of a comprehensive MRSA colonization surveillance program. It is not intended to diagnose MRSA infection nor to guide or monitor treatment for MRSA infections. Test performance is not FDA approved in patients less than 34 years old. Performed at [facility], [place] [address] [place], [place] [address]     Antimicrobials: Anti-infectives (From admission, onward)    Start     Dose/Rate Route Frequency Ordered Stop   04/18/21 1000  cefTRIAXone (ROCEPHIN) 1 g in sodium chloride 0.9 % 100 mL IVPB        1 g 200 mL/hr over 30 Minutes Intravenous Every 24 hours 04/18/21 0801  04/23/21 0959      Culture/Microbiology    Component Value Date/Time   SDES  03/29/2021 1959    BLOOD LEFT WRIST Performed at [facility], [place] [address], [place], [place] [address]    [place]  03/29/2021 1959    BOTTLES DRAWN AEROBIC AND ANAEROBIC Blood Culture adequate volume Performed at [facility] [place], [place]  [place]  03/29/2021 1959    NO GROWTH 5 DAYS Performed at [facility], [place] [address] [place], [place] [address]    REPTSTATUS 04/03/2021 FINAL 03/29/2021 1959    Radiology Studies: [facility] Chest Port 1 View  Result Date: 04/17/2021 CLINICAL DATA:  Short of breath.  Respiratory distress EXAM: PORTABLE CHEST 1 VIEW COMPARISON:  03/29/2021 FINDINGS: Heart size and vascularity normal. Improved aeration in the right lung base which is now clear. Left lower lobe remains clear. Heart size normal. Cardiac loop recorder noted. Surgical clips right axilla. IMPRESSION: Interval improvement in right lower lobe airspace disease and right effusion. Lungs now clear. Electronically Signed   By: Franchot Gallo M.D.   On: 04/17/2021 17:34     LOS: 1 day   Antonieta Pert, MD [facility]  04/18/2021, 11:21 AM

## 2021-04-18 NOTE — Progress Notes (Signed)
Received verbal consent from Cardiology team to drop Cardizem down to 5 & within an hour to cut Cardizem off.

## 2021-04-18 NOTE — Consult Note (Addendum)
Cardiology Consultation:   Patient ID: Mardee Clune MRN: [id number]; DOB: Apr 08, 1926  Admit date: 04/17/2021 Date of Consult: 04/18/2021  PCP:  [name], MD   [facility] Providers Cardiologist:  Mertie Moores, MD  Electrophysiologist:  [name], MD  {  Patient Profile:   [name] Escamilla is a 88 y.o. female with a history of paroxysmal atrial fibrillation on Eliquis, chronic diastolic CHF, moderate aortic stenosis, COPD, hypertension, hyperlipidemia, type 2 diabetes mellitus, stage IV CKD, prior stroke s/p implantable loop recorder, prior PE/DVT, chronic anemia, and dementia who was admitted on 04/17/2021 for acute hypoxic respiratory failure secondary to acute COPD exacerbation.  Cardiology was consulted for further evaluation of atrial fibrillation with RVR at the request of Dr. Lupita Leash.  History of Present Illness:   Ms. Petsch is a 88 year old female with the above history who has primarily been followed by Dr. Lovena Le and [facility].  Patient has a history of cryptogenic stroke in 2019.  She had implantable loop recorder placed in 08/2017 which revealed paroxysmal atrial fibrillation and she was started on anticoagulation with Eliquis.  She was admitted in 12/2020 with worsening shortness of breath.  High-sensitivity troponin was consistent with ACS but he EKG did show diffuse ST depressions in inferior and lateral leads that were more pronounced compared to prior tracings.  Ago showed LVEF of 60-65% with normal wall motion, mild LVH, and grade 1 diastolic dysfunction.  RV was normal with mildly elevated PASP at 39.6 mmHg.  Also showed mild to moderate MS and moderate AS.  She was seen by Cardiology for abnormal EKG and decision was made to avoid any invasive procedure and treat empirically with Imdur given advanced age, underlying CKD stage IV with creatinine of 2.7, and overall frailty.  Shortness of breath was felt to be more pulmonary in nature.  Patient has had  multiple other admissions over the 2 months for a variety of issues including symptomatic anemia requiring transfusion, hypertensive urgency, acute on chronic bronchitis, acute on chronic COPD, and most recently community-acquired pneumonia at the end of January 2023.  During hospitalization in early January 2023, she was found to have an elevated high-sensitivity troponin peaking at 169.  However, this was in the setting of hypertensive urgency with systolic BP in the 024O and COPD exacerbation.  Echo showed EF of 60-65% with normal wall motion.  Cardiology was consulted and no further work-up was recommended given advanced age and normal Echo.  Presented to [facility], ED on 04/17/2021 via EMS for further evaluation of shortness of breath with O2 sats reportedly in the 80s.Upon arrival to the ED, patient was tachycardic, hypertensive, and requiring 10 L of O2.  Systolic BP as high as 973 and diastolic BP as high as 532.  EKG automatically read as rapid atrial fibrillation but clear P waves can be seen so that this may be more sinus tachycardia with rate of 145 bpm.  EKG does show diffuse ST depressions which is likely rate related.  High-sensitivity troponin 39 >> 95 >> 182. BNP mildly elevated at 257. Chest x-ray showed interval improvement in right lower lobe airspace disease and right effusion. WBC 18.6, Hgb 9.3, Plts 314. Na 130, K 4.5, Glucose 412, BUN 81, Cr 2.47. LFTs normal. Magnesium 2.2. Venous blood gas pH 7.32, pCO2 67, p O2 81, BiCarb 34.5. Respiratory panle COVID and influenza.  Procalcitonin negative.  Patient was started on IV Cardizem and treated with nebulizers and a 500 cc fluid bolus.  Cardiology was consulted for further evaluation of possible atrial fibrillation.  At the time of this evaluation, patient resting comfortably in no acute distress.Currently on 2L of O2 via nasal cannula. She is in sinus rhythm with rates in the 80s. Patient is hard of hearing and has difficult answering  questions. Her caregiver is at the bedside and history obtained from her. Patient lives at home but has 24 hour care. Her caregiver states she has presumed dementia. She was initially doing well since being discharged from the hospital on 04/02/2021 but then on Sunday 04/15/2021 she started to cough and have wheezing again. She then declined pretty rapidly. She was noticeably short of breath on day of presentation which is why EMS was called. Upon arrival of EMS, O2 sats were in the 80s but came up to the 80s after a nebulizer. Caregiver states she does not ever complains of any chest pain. She has slept in a chair for the past 15-20 years due to comfort and caregiver states because of this she does not laying in the bed and this has taken some getting used to. She is now bedbound though. No PND. No lower extremity edema. When patient was asked if she ever has palpitations, she answered yet but I am not sure if she really understood the question as she was not able to answer other questions. No syncope. No abnormal bleeding in urine or stools. No hemoptysis.  Caregiver states systolic BP ranges from 240X to 150s at home but is never in the 200s. It sounds like this happens when she comes in respiratory failure. Patient does follow with outpatient palliative care and per caregiver, patient recently switched to hospice.  Past Medical History:  Diagnosis Date   Arthritis    "mostly in my hands, lower back" (06/25/2017)   Basal cell carcinoma (BCC) of face 1983   Breast cancer, right breast ([place]) 03/2013   Chronic kidney disease (CKD), stage III (moderate) ([place])    nephrologist, Dr. [name]   Dental crowns present    DVT (deep venous thrombosis) ([place]) ~ 06/2013   "? side"   Gout    "on daily RX" (06/25/2017)   Heart murmur    no known problems; states did not know she had murmur until age 62   High cholesterol    Hypertension    fluctuates, especially when stressed; has been on med. > 20 yr.    Immature cataract    Non-insulin dependent type 2 diabetes mellitus ([place])    Personal history of chemotherapy    Personal history of radiation therapy    Pulmonary embolism ([place]) ~ 06/2013   Radiation 09/06/13-10/20/13   Right Breast Cancer   Stroke ([place]) 05/2017   just visual problems since (06/25/2017)   Wears partial dentures    lower    Past Surgical History:  Procedure Laterality Date   AXILLARY LYMPH NODE DISSECTION Right 03/30/2013   Procedure: AXILLARY LYMPH NODE DISSECTION;  Surgeon: Rolm Bookbinder, MD;  Location: [facility];  Service: General;  Laterality: Right;   BASAL CELL CARCINOMA EXCISION  1983   "face"   BREAST BIOPSY Right 03/17/2013   BREAST CYST EXCISION Right 11/1958   benign   BREAST LUMPECTOMY Right 03/30/2013   BREAST LUMPECTOMY WITH NEEDLE LOCALIZATION Right 03/30/2013   Procedure: BREAST LUMPECTOMY WITH NEEDLE LOCALIZATION;  Surgeon: Rolm Bookbinder, MD;  Location: [place];  Service: General;  Laterality: Right;   DILATION AND CURETTAGE OF UTERUS     LOOP RECORDER  INSERTION N/A 08/15/2017   Procedure: LOOP RECORDER INSERTION;  Surgeon: Evans Lance, MD;  Location: [place];  Service: Cardiovascular;  Laterality: N/A;   PORT-A-CATH REMOVAL  2016   PORTACATH PLACEMENT N/A 04/15/2013   Procedure: INSERTION PORT-A-CATH;  Surgeon: Rolm Bookbinder, MD;  Location: [facility];  Service: General;  Laterality: N/A;   RE-EXCISION OF BREAST CANCER,SUPERIOR MARGINS Right 04/15/2013   Procedure: RE-EXCISION OF RIGHT BREAST  MARGINS;  Surgeon: Rolm Bookbinder, MD;  Location: [place];  Service: General;  Laterality: Right;   TONSILLECTOMY  ~ 1935/1936     Home Medications:  Prior to Admission medications   Medication Sig Start Date End Date Taking? Authorizing Provider  allopurinol (ZYLOPRIM) 100 MG tablet Take 100 mg by mouth daily. 01/17/16  Yes [provider]  cholecalciferol (VITAMIN D) 1000 UNITS tablet Take  1,000 Units by mouth daily.   Yes [provider]  cloNIDine (CATAPRES) 0.2 MG tablet Take 0.2 mg by mouth 2 (two) times daily.   Yes [provider]  diltiazem (CARDIZEM) 60 MG tablet Take 1 tablet (60 mg total) by mouth every 8 (eight) hours. 02/13/21 04/18/21 Yes Arrien, Jimmy Picket, MD  ELIQUIS 2.5 MG TABS tablet TAKE 1 TABLET BY MOUTH TWICE DAILY. 07/04/20  Yes Fenton, Clint R, PA  glipiZIDE (GLUCOTROL) 5 MG tablet Take 1 tablet (5 mg total) by mouth See admin instructions. Takes 1 tab every am and 1/2 tab every evening 04/02/21  Yes Georgette Shell, MD  hydrALAZINE (APRESOLINE) 50 MG tablet Take 50 mg by mouth 3 (three) times daily.   Yes [provider]  isosorbide mononitrate (IMDUR) 30 MG 24 hr tablet Take 1 tablet (30 mg total) by mouth daily. 12/22/20 02/07/22 Yes Pokhrel, Corrie Mckusick, MD  acetaminophen (TYLENOL) 500 MG tablet Take 500 mg by mouth daily as needed for fever or moderate pain (for foot pain).    [provider]  albuterol (VENTOLIN HFA) 108 (90 Base) MCG/ACT inhaler Inhale 1-2 puffs into the lungs every 6 (six) hours as needed for wheezing or shortness of breath. 03/22/19   [provider]  amLODipine (NORVASC) 2.5 MG tablet Take 2.5 mg by mouth daily.    [provider]  benzonatate (TESSALON) 100 MG capsule Take 1 capsule (100 mg total) by mouth every 8 (eight) hours. 03/04/21   Domenic Moras, PA-C  famotidine (PEPCID) 20 MG tablet Take 20 mg by mouth daily. 11/09/19   [provider]  furosemide (LASIX) 40 MG tablet Take 40 mg by mouth daily. Per Dr. If over 166lb take 80mg  a day    [provider]  ipratropium-albuterol (DUONEB) 0.5-2.5 (3) MG/3ML SOLN Take 3 mLs by nebulization every 6 (six) hours as needed (wheezing and shortness of breath). 02/13/21   Arrien, Jimmy Picket, MD  Morphine Sulfate (MORPHINE CONCENTRATE) 10 mg / 0.5 ml concentrated solution Take 5 mg by mouth every 4 (four) hours as needed for  shortness of breath. 04/17/21   [provider]  nystatin (MYCOSTATIN) 100000 UNIT/ML suspension Use as directed 5 mLs in the mouth or throat 2 (two) times daily. 03/22/21   [provider]  ondansetron (ZOFRAN) 4 MG tablet Take 1 tablet (4 mg total) by mouth every 6 (six) hours as needed for nausea. 04/02/21   Georgette Shell, MD  polyethylene glycol (MIRALAX / GLYCOLAX) 17 g packet Take 17 g by mouth daily.    [provider]  senna-docusate (SENOKOT-S) 8.6-50 MG  tablet Take 1 tablet by mouth at bedtime as needed for mild constipation. Patient not taking: Reported on 03/30/2021 03/15/21   Donne Hazel, MD  TRUE METRIX BLOOD GLUCOSE TEST test strip  07/19/19   [provider]  TRUEplus Lancets 28G MISC Apply topically. 08/20/19   [provider]    Inpatient Medications: Scheduled Meds:  apixaban  2.5 mg Oral BID   Chlorhexidine Gluconate Cloth  6 each Topical Daily   diltiazem  60 mg Oral Q8H   famotidine  20 mg Oral Daily   feeding supplement  237 mL Oral BID BM   glipiZIDE  2.5 mg Oral QAC supper   glipiZIDE  5 mg Oral QAC breakfast   hydrALAZINE  25 mg Oral Q8H   insulin aspart  0-5 Units Subcutaneous QHS   insulin aspart  0-9 Units Subcutaneous TID WC   ipratropium  0.5 mg Nebulization TID   isosorbide mononitrate  30 mg Oral Daily   levalbuterol  1.25 mg Nebulization TID   methylPREDNISolone (SOLU-MEDROL) injection  40 mg Intravenous Q12H   Continuous Infusions:  cefTRIAXone (ROCEPHIN)  IV Stopped (04/18/21 0941)   PRN Meds: acetaminophen **OR** acetaminophen, levalbuterol  Allergies:    Allergies  Allergen Reactions   Beta Adrenergic Blockers Other (See Comments)    Fatigue    Social History:   Social History   Socioeconomic History   Marital status: Married    Spouse name: Not on file   Number of children: 0   Years of education: Not on file   Highest education level: Bachelor's degree (e.g., BA, AB, BS)   Occupational History   Not on file  Tobacco Use   Smoking status: Never   Smokeless tobacco: Never   Tobacco comments:    only smoked 2 packs cigarettes total; husband quit in 1971  Vaping Use   Vaping Use: Never used  Substance and Sexual Activity   Alcohol use: Not Currently    Alcohol/week: 3.0 standard drinks    Types: 3 Glasses of wine per week   Drug use: No   Sexual activity: Not Currently  Other Topics Concern   Not on file  Social History Narrative   Lives at home with her husband   Right handed   Caffeine: 1 coffee daily at most    Social Determinants of Health   Financial Resource Strain: Not on file  Food Insecurity: Not on file  Transportation Needs: Not on file  Physical Activity: Not on file  Stress: Not on file  Social Connections: Not on file  Intimate Partner Violence: Not on file    Family History:   Family History  Problem Relation Age of Onset   Pneumonia Mother    Heart attack Father    Breast cancer Other 51       niece   Breast cancer Other 74       niece   Ovarian cancer Other        niece     ROS:  Please see the history of present illness.  Review of Systems  Unable to perform ROS: Mental acuity      Physical Exam/Data:   Vitals:   04/18/21 0800 04/18/21 0823 04/18/21 0900 04/18/21 1000  BP: (!) 225/60  (!) 198/52 (!) 181/47  Pulse: (!) 104  94 87  Resp: (!) 32  17 (!) 21  Temp:   98.6 F (37 C)   TempSrc:   Axillary   SpO2: 94% 94% 96%  94%  Weight:      Height:        Intake/Output Summary (Last 24 hours) at 04/18/2021 1037 Last data filed at 04/18/2021 0841 Gross per 24 hour  Intake 990.14 ml  Output 1000 ml  Net -9.86 ml   Last 3 Weights 04/17/2021 04/17/2021 04/02/2021  Weight (lbs) 155 lb 10.3 oz 160 lb 0.9 oz 160 lb 0.9 oz  Weight (kg) 70.6 kg 72.6 kg 72.6 kg     Body mass index is 31.44 kg/m.  General: 88 y.o. ill appearing Caucasian female resting comfortably. Currently on 2L of O2 via nasal  cannula. HEENT: Normocephalic and atraumatic. Sclera clear.  Neck: Supple. No JVD. Heart: RRR. Distinct S1 and S2. Soft murmur noted at apex.Radial and distal pedal pulses 2+ and equal bilaterally. Lungs: No increased work of breathing. Clear to ausculation bilaterally. No wheezes, rhonchi, or rales.  Abdomen: Soft, non-distended, and non-tender to palpation. Bowel sounds present. Extremities: No lower extremity edema.    Skin: Warm and dry. Neuro: No focal deficits. Psych: Does not always respond appropriately to questions.  EKG:  The EKG was personally reviewed and demonstrates:  Sinus tachycardia, rate 145 bpm, with diffuse ST depression (likely rate related). Normal axis. QTc 445 ms. Telemetry:  Telemetry was personally reviewed and demonstrates:  Sinus rhythm with rates in the 80s to 90s. Occasional PACs noted.  Relevant CV Studies:  Echocardiogram 03/10/2021: Impressions: 1. Left ventricular ejection fraction, by estimation, is 60 to 65%. The  left ventricle has normal function. The left ventricle has no regional  wall motion abnormalities.   2. Right ventricular systolic function is normal. The right ventricular  size is normal.   3. MV is thickened. Peakd and mean gradients through the valve are 13 and  6 mm Hg with MVA by VTI of 1.5 cm2 consistent with mild to moderate MS. No  significant change in gradients from report from 12/19/20. . The mitral  valve is degenerative. Trivial  mitral valve regurgitation. Moderate to severe mitral annular  calcification.   4. AV is thickened, calcified with restricted motion. Peak and mean  gradients through the valve are 49 and 27 mm HG respectively. AVA (VTI)  1.04 cm2 Dimensionless index is 0.37. Consistent with moderate AS. No  significant change from mean gradient from  12/19/20 (25 to 27 mm Hg). Aortic valve regurgitation is mild.   5. The inferior vena cava is dilated in size with <50% respiratory  variability, suggesting right  atrial pressure of 15 mmHg.   Laboratory Data:  High Sensitivity Troponin:   Recent Labs  Lab 04/17/21 1643 04/17/21 2000 04/18/21 0715  TROPONINIHS 39* 95* 182*     Chemistry Recent Labs  Lab 04/17/21 1643 04/17/21 1704 04/18/21 0251  NA 130* 129* 130*  K 4.5 4.6 4.4  CL 87* 90* 89*  CO2 32  --  29  GLUCOSE 412* 412* 422*  BUN 81* 85* 77*  CREATININE 2.47* 2.50* 2.31*  CALCIUM 11.5*  --  11.1*  MG 2.2  --  2.4  GFRNONAA 18*  --  19*  ANIONGAP 11  --  12    Recent Labs  Lab 04/17/21 1643 04/18/21 0251  PROT 7.3 6.6  ALBUMIN 3.6 3.0*  AST 14* 14*  ALT 12 11  ALKPHOS 79 70  BILITOT 0.5 0.3   Lipids No results for input(s): CHOL, TRIG, HDL, LABVLDL, LDLCALC, CHOLHDL in the last 168 hours.  Hematology Recent Labs  Lab 04/17/21 1643 04/17/21 1704  04/18/21 0251  WBC 18.6*  --  12.6*  RBC 2.96*  --  2.63*  HGB 9.3* 10.2* 8.2*  HCT 29.5* 30.0* 26.0*  MCV 99.7  --  98.9  MCH 31.4  --  31.2  MCHC 31.5  --  31.5  RDW 15.1  --  15.0  PLT 314  --  235   Thyroid No results for input(s): TSH, FREET4 in the last 168 hours.  BNP Recent Labs  Lab 04/17/21 1643  BNP 257.1*    DDimer No results for input(s): DDIMER in the last 168 hours.   Radiology/Studies:  DG Chest Port 1 View  Result Date: 04/17/2021 CLINICAL DATA:  Short of breath.  Respiratory distress EXAM: PORTABLE CHEST 1 VIEW COMPARISON:  03/29/2021 FINDINGS: Heart size and vascularity normal. Improved aeration in the right lung base which is now clear. Left lower lobe remains clear. Heart size normal. Cardiac loop recorder noted. Surgical clips right axilla. IMPRESSION: Interval improvement in right lower lobe airspace disease and right effusion. Lungs now clear. Electronically Signed   By: Franchot Gallo M.D.   On: 04/17/2021 17:34     Assessment and Plan:   Paroxysmal Atrial Fibrillation Patient admitted with acute hypoxic respiratory failure and was found to be tachycardic with rates in the 140s.  There was concern that presenting rhythm was rapid atrial fibrillation. EKG automatically read as atrial fibrillation with RVR; however, clear P waves can seen. I think it is more consistent with sinus tachycardia. Recent Echo showed normal LV function. Magnesium and potassium normal. She was started on IV Cardizem and is now in sinus rhythm with rates in the 80s to 90s. - Tachycardia was likely secondary to acute respiratory failure. Can continue home PO Cardizem 60mg  three times daily and stop IV Cardizem. - Continue chronic anticoagulation with Eliquis 2.5mg  twice daily (reduced dosing due to age and renal function).  Demand Ischemia High-sensitivity troponin 39 >> 95 >> 182. EKG did shows diffuse ST depressions but suspect some of this was rate related. Recent Echo showed normal LV function. - No chest pain. - Troponin elevation consistent with demand ischemia secondary to hypertensive urgency and acute hypoxic respiratory failure. No additional work-up recommended given advanced age, underlying CKD stage IV, dementia, and the fact that patient has transitioned to hospice as an outpatient.  Chronic Diastolic CHF BNP mildly elevated in the 200s. Chest x-ray showed no overt edema and actual interval improvement in right lower lobe airspace disease and right effusion. Recent Echo in 03/2021 showed LVEF of 60-65% with grade 1 diastolic dysfunction, moderate AS, and mild to moderate MS.  - She does not appear significantly volume overloaded at this time. No need for diuretics. - Continue to monitor volume status closely.  Hypertensive Urgency Systolic BP as high as 893 on admission. Caregiver states systolic BP is usually in the 110s to 150s at home but per chart review she has history of hypertensive urgency in setting of COPD exacerbations. BP improved but still elevated with systolic BP in the 810F. - Continue home PO Cardizem 60mg  three times daily and Imdur 30mg  daily. - Home Clonidine held on  admission.  - Will start Hydralazine 25mg  three times daily given we are stopping IV Cardizem. - No ACEI/ARB/ARNI or Spironolactone due to renal function. - Can continue to adjust medications as needed.  CKD Stage IV Creatinine 2.47 on admission. Baseline around 2.0 to 2.5.  - Improved to 2.3 after fluids. - Continue to monitor closely.  Of note,  caregiver reports patient has transitioned to hospice as an outpatient.  Otherwise, per primary team: - Acute COPD exacerbation - Leukocytosis - Hyperlipidemia - Type 2 diabetes mellitus - Chronic anemia - Dementia  Risk Assessment/Risk Scores:    New York Heart Association (NYHA) Functional Class Difficult to assess functional status as patient is bedbound. However, current respiratory distress secondary to COPD not CHF.  CHA2DS2-VASc Score = 7  This indicates a 11.2% annual risk of stroke. The patient's score is based upon: CHF History: 0 HTN History: 1 Diabetes History: 1 Stroke History: 2 Vascular Disease History: 0 Age Score: 2 Gender Score: 1   For questions or updates, please contact [facility] Please consult [email or web address] for contact info under    Signed, Darreld Mclean, PA-C  04/18/2021 10:37 AM  History and all data above reviewed.  Patient examined.  I agree with the findings as above.  The patient is demented and is not able to give any history.  She is at home with 24/7 care and now hospice in the home.  The caregiver who is with her is with her 40 hours a week and has only missed 2 shifts in 5 years!  Patient sounds like she gets exquisite attempt of care.  She is completely bedbound and total care.  She is on chronic 2 L.  It does not sound like there is any prodrome before she gets acutely ill.  She starts coughing and getting more short of breath and was bringing up some white sputum and her blood pressure goes up.  Her baseline systolic seems to probably be mildly elevated in the 140s.  Heart rate is  usually controlled.  She does not have chest pain but she reports.  She is chronically short of breath.  She has not been having any weight gain or swelling or PND or orthopnea.  The patient exam reveals COR:RRR  ,  Lungs: Decreased breath sounds  ,  Abd: Positive bowel sounds, no rebound no guarding, Ext No edema  .  All available labs, radiology testing, previous records reviewed. Agree with documented assessment and plan.   Acute respiratory distress: I do not see an acute precipitator that we could identify to had all of these events.  Perhaps slightly better blood pressure control at home might help.  Otherwise management per primary team and her home health team.   Atrial fib: She is now back in sinus rhythm.  I will resume her p.o. medications including her Eliquis for now and her p.o. Cardizem.  We might titrate this up pending her blood pressures.  Jeneen Rinks Jerod Mcquain  11:14 AM  04/18/2021

## 2021-04-18 NOTE — Assessment & Plan Note (Addendum)
On comfort care at this time

## 2021-04-18 NOTE — Assessment & Plan Note (Addendum)
Comfort care  °

## 2021-04-18 NOTE — Assessment & Plan Note (Addendum)
Currently on 2 L.  On comfort care.

## 2021-04-18 NOTE — Assessment & Plan Note (Addendum)
.  COVID-19 and influenza PCR negative.  Procalcitonin elevated, continue Xopenex nebulizer.  Currently on room air

## 2021-04-18 NOTE — Assessment & Plan Note (Addendum)
Encourage oral intake.

## 2021-04-18 NOTE — Assessment & Plan Note (Addendum)
Continue clonidine patch.  Hydralazine as needed

## 2021-04-18 NOTE — Progress Notes (Addendum)
[facility]) hospitalized hospice patient visit.  This is a current recently enrolled (04/15/2021) hospice patient with a terminal diagnosis of Cerebrovascular disease.  Family activated EMS when patient became extremely short of breath. Patient was admitted to hospital on 04/17/21 with a diagnosis of Acute exacerbation of chronic obstructive pulmonary disease. Per Dr. Karie Georges with [facility] this is a related admission.  Visited patient at bedside. She was sleeping and did not wake to my voice. She appeared comfortable and in no apparent distress. No family at bedside.  This patient is appropriate for inpatient due to need for symptom management and IV antibiotics.  VS: 99.0, 182/53, 95, 23, 97% 2Lnc I/O: 559.07/998  Abnormal labs: 04/18/21 02:51 Acid-Base Excess: 7.4 (H) Bicarbonate: 33.4 (H) Sodium: 130 (L) Chloride: 89 (L) Glucose: 422 (H) BUN: 77 (H) Creatinine: 2.31 (H) Calcium: 11.1 (H) Albumin: 3.0 (L) AST: 14 (L) GFR, Estimated: 19 (L) WBC: 12.6 (H) RBC: 2.63 (L) Hemoglobin: 8.2 (L) HCT: 26.0 (L)  Diagnostics: CXR IMPRESSION: Interval improvement in right lower lobe airspace disease and right effusion. Lungs now clear.  IV/PRN medications: Rocephin 1g IVPB QD  Problem list: Assessment and Plan: * Acute exacerbation of chronic obstructive pulmonary disease (COPD) ([place])- (present on admission) Chest x-ray with improvement recent pneumonia.  Presenting with respiratory distress hypoxia shortness of breath and wheezing.  COVID-19 and influenza PCR negative.  Procalcitonin elevated could be bronchitis add antibiotic/.  Not wheezing this morning decrease Solu-Medrol to 40 mg, cont bronchodilators as heart rate tolerates, supplemental oxygen, PT OT OOB   Acute respiratory failure with hypoxia ([place])- (present on admission) Initially needing up to 10 L nasal cannula.  Currently on 2 L.  Baseline.  Continue supplemental oxygen continue treatment of  underlying respiratory and cardiac issues.   Paroxysmal atrial fibrillation with RVR ([place])- (present on admission) Admitted on Cardizem drip, hopefully can transition Cardizem drip to p.o., cardiology consulted. Continue anticoagulation with Eliquis   Hypertensive urgency- (present on admission) BP poorly controlled.  Resume home hydralazine, on Cardizem.  Continue Imdur.  Cardiology on consult.   Atrial fibrillation with RVR ([place]) Admitted on Cardizem drip, transition to p.o. Cardizem, rate improving continue anticoagulation with Eliquis   Elevated troponin- (present on admission) Likely demand ischemia in the setting of A-fib RVR hypoxia.  Repeat troponin 174. Continue anticoagulation, Imdur, monitor.  Cardiology on consult  Discharge planning:  return home with hospice when stable for discharge.  Family Contact: Spoke with niece [name] by phone. IDG:  Updated Goals of care:  DNR. Clear. Treat infection only. No aggressive therapies or interventions.  Transfer Summary and medication list placed on shadow chart.   Should patient need ambulance transport at discharge please use [facility] as [facility] contracts this service with them for our active hospice patients.   A Please do not hesitate to call with questions.   Thank you,   Farrel Gordon, RN, [facility] Liaison   [phone]

## 2021-04-18 NOTE — Hospital Course (Addendum)
88 years old female with past medical history of dementia, stage IV chronic kidney disease with baseline creatinine 2-2.5, hypertension, type 2 diabetes mellitus, chronic diastolic heart failure, PAF on Eliquis, COPD, moderate aortic stenosis, chronic anemia with baseline hemoglobin 8-9 presented to the ED 04/17/21 with shortness of breath for 1 day with new onset wheezing.  Patient was recently admitted hospital January 2023 for right lower lobe pneumonia.  In the ED patient had leukocytosis with elevated troponin and BNP.  EKG showed atrial fibrillation with RVR.  Cardizem bolus was given in the ED followed by drip and was admitted to the stepdown unit for A-fib with RVR, COPD exacerbation acute hypoxic respiratory failure mild dehydration AKI and hypercalcemia.  Cardiology was consulted.  Patient had maroon-colored large bowel movement on 04/19/2021 thought to be secondary to acute GI bleed.  Palliative care was consulted and at this time, patient is awaiting for transfer to [facility] with comfort care.

## 2021-04-18 NOTE — Assessment & Plan Note (Addendum)
Lasix on hold due to AKI.  On comfort care.

## 2021-04-18 NOTE — Progress Notes (Signed)
Inpatient Diabetes Program Recommendations  AACE/ADA: New Consensus Statement on Inpatient Glycemic Control   Target Ranges:  Prepandial:   less than 140 mg/dL      Peak postprandial:   less than 180 mg/dL (1-2 hours)      Critically ill patients:  140 - 180 mg/dL    Latest Reference Range & Units 04/18/21 05:58 04/18/21 07:55 04/18/21 11:42  Glucose-Capillary 70 - 99 mg/dL 390 (H) 330 (H) 316 (H)   Review of Glycemic Control  Diabetes history: DM2 Outpatient Diabetes medications: Glipizide 5 mg QAM, Glipizide 2.5 mg QPM Current orders for Inpatient glycemic control: Glipizide 5 mg QAM, Glipizide 2.5 mg QPM, Novolog 0-9 units TID with meals, Novolog 0-5 units QHS; Solumedrol 40 mg Q12H  Inpatient Diabetes Program Recommendations:    Insulin: If steroids are continued and glucose remains consistently over 180 mg/dl, may want to consider ordering Levemir 7 units Q24H (based on 70.6 kg x 0.1 units).  Thanks, Barnie Alderman, RN, MSN, CDE Diabetes Coordinator Inpatient Diabetes Program [phone] (Team Pager from 8am to 5pm)

## 2021-04-18 NOTE — Assessment & Plan Note (Addendum)
Patient was recently enrolled in hospice at the facility. Patient with active GI bleed severe anemia with multiple comorbidities. Overall prognosis is poor.  On comfort care.

## 2021-04-18 NOTE — Assessment & Plan Note (Addendum)
Likely demand ischemia in the setting of A-fib RVR hypoxia.

## 2021-04-18 NOTE — Assessment & Plan Note (Addendum)
Latest creatinine of 2.4 on 04/20/2021.  No recent labs.

## 2021-04-18 NOTE — Progress Notes (Signed)
°  Transition of Care [facility]) Screening Note   Patient Details  Name: [name] Date of Birth: Jun 01, 1926   Transition of Care [facility]) CM/SW Contact:    Lennart Pall, LCSW Phone Number: 04/18/2021, 2:40 PM    Transition of Care Department [facility]) has reviewed patient and no TOC needs have been identified at this time. We will continue to monitor patient advancement through interdisciplinary progression rounds. If new patient transition needs arise, please place a TOC consult.

## 2021-04-18 NOTE — Assessment & Plan Note (Addendum)
Continue empiric antibiotics WBC count downtrending  Recent Labs  Lab 04/17/21 1643 04/17/21 2000 04/18/21 0251  WBC 18.6*  --  12.6*  PROCALCITON  --  0.73  --

## 2021-04-19 ENCOUNTER — Inpatient Hospital Stay (HOSPITAL_COMMUNITY)

## 2021-04-19 DIAGNOSIS — R5383 Other fatigue: Secondary | ICD-10-CM

## 2021-04-19 DIAGNOSIS — L89159 Pressure ulcer of sacral region, unspecified stage: Secondary | ICD-10-CM

## 2021-04-19 DIAGNOSIS — K922 Gastrointestinal hemorrhage, unspecified: Secondary | ICD-10-CM

## 2021-04-19 DIAGNOSIS — Z7189 Other specified counseling: Secondary | ICD-10-CM

## 2021-04-19 DIAGNOSIS — J441 Chronic obstructive pulmonary disease with (acute) exacerbation: Secondary | ICD-10-CM | POA: Diagnosis not present

## 2021-04-19 LAB — CBC
HCT: 17.5 % — ABNORMAL LOW (ref 36.0–46.0)
Hemoglobin: 5.7 g/dL — CL (ref 12.0–15.0)
MCH: 31.5 pg (ref 26.0–34.0)
MCHC: 32.6 g/dL (ref 30.0–36.0)
MCV: 96.7 fL (ref 80.0–100.0)
Platelets: 274 10*3/uL (ref 150–400)
RBC: 1.81 MIL/uL — ABNORMAL LOW (ref 3.87–5.11)
RDW: 15.4 % (ref 11.5–15.5)
WBC: 23.1 10*3/uL — ABNORMAL HIGH (ref 4.0–10.5)
nRBC: 0.1 % (ref 0.0–0.2)

## 2021-04-19 LAB — TYPE AND SCREEN
ABO/RH(D): O NEG
Antibody Screen: NEGATIVE

## 2021-04-19 LAB — GLUCOSE, CAPILLARY
Glucose-Capillary: 335 mg/dL — ABNORMAL HIGH (ref 70–99)
Glucose-Capillary: 254 mg/dL — ABNORMAL HIGH (ref 70–99)

## 2021-04-19 LAB — BASIC METABOLIC PANEL
Anion gap: 8 (ref 5–15)
BUN: 83 mg/dL — ABNORMAL HIGH (ref 8–23)
CO2: 33 mmol/L — ABNORMAL HIGH (ref 22–32)
Calcium: 11.3 mg/dL — ABNORMAL HIGH (ref 8.9–10.3)
Chloride: 93 mmol/L — ABNORMAL LOW (ref 98–111)
Creatinine, Ser: 2.14 mg/dL — ABNORMAL HIGH (ref 0.44–1.00)
GFR, Estimated: 21 mL/min — ABNORMAL LOW (ref >60)
Glucose, Bld: 350 mg/dL — ABNORMAL HIGH (ref 70–99)
Potassium: 4.3 mmol/L (ref 3.5–5.1)
Sodium: 134 mmol/L — ABNORMAL LOW (ref 135–145)

## 2021-04-19 LAB — VITAMIN D 25 HYDROXY (VIT D DEFICIENCY, FRACTURES): Vit D, 25-Hydroxy: 58.28 ng/mL (ref 30–100)

## 2021-04-19 LAB — PREPARE RBC (CROSSMATCH)

## 2021-04-19 LAB — BRAIN NATRIURETIC PEPTIDE: B Natriuretic Peptide: 903.1 pg/mL — ABNORMAL HIGH (ref 0.0–100.0)

## 2021-04-19 MED ORDER — CLONIDINE HCL 0.1 MG PO TABS: 0.2000 mg | ORAL_TABLET | Freq: Two times a day (BID) | ORAL | Status: DC

## 2021-04-19 MED ORDER — PREDNISONE 20 MG PO TABS
20.0000 mg | ORAL_TABLET | Freq: Every day | ORAL | Status: DC
  Filled 2021-04-19: qty 1

## 2021-04-19 MED ORDER — INSULIN GLARGINE-YFGN 100 UNIT/ML ~~LOC~~ SOLN
7.0000 [IU] | Freq: Once | SUBCUTANEOUS | Status: AC
  Administered 2021-04-19: 7 [IU] via SUBCUTANEOUS
  Filled 2021-04-19: qty 0.07

## 2021-04-19 MED ORDER — CLONIDINE HCL 0.2 MG/24HR TD PTWK
0.4000 mg | MEDICATED_PATCH | TRANSDERMAL | Status: DC
  Administered 2021-04-19: 0.4 mg via TRANSDERMAL
  Filled 2021-04-19: qty 2

## 2021-04-19 MED ORDER — PANTOPRAZOLE 80MG IVPB - SIMPLE MED
80.0000 mg | Freq: Once | INTRAVENOUS | Status: AC
Start: 2021-04-19 — End: 2021-04-19
  Administered 2021-04-19: 80 mg via INTRAVENOUS
  Filled 2021-04-19: qty 80

## 2021-04-19 MED ORDER — CLONIDINE HCL 0.1 MG PO TABS: 0.1000 mg | ORAL_TABLET | Freq: Two times a day (BID) | ORAL | Status: DC

## 2021-04-19 MED ORDER — HYDRALAZINE HCL 20 MG/ML IJ SOLN
10.0000 mg | Freq: Four times a day (QID) | INTRAMUSCULAR | Status: DC | PRN
  Administered 2021-04-20: 10 mg via INTRAVENOUS
  Filled 2021-04-19: qty 1

## 2021-04-19 MED ORDER — INSULIN ASPART 100 UNIT/ML IJ SOLN
7.0000 [IU] | Freq: Three times a day (TID) | INTRAMUSCULAR | Status: DC
  Administered 2021-04-19 (×2): 7 [IU] via SUBCUTANEOUS

## 2021-04-19 MED ORDER — LEVALBUTEROL HCL 1.25 MG/0.5ML IN NEBU
1.2500 mg | INHALATION_SOLUTION | Freq: Two times a day (BID) | RESPIRATORY_TRACT | Status: DC
  Administered 2021-04-19 – 2021-04-20 (×3): 1.25 mg via RESPIRATORY_TRACT
  Filled 2021-04-19 (×3): qty 0.5

## 2021-04-19 MED ORDER — IPRATROPIUM BROMIDE 0.02 % IN SOLN
0.5000 mg | Freq: Two times a day (BID) | RESPIRATORY_TRACT | Status: DC
Start: 2021-04-19 — End: 2021-04-21
  Administered 2021-04-19 – 2021-04-20 (×3): 0.5 mg via RESPIRATORY_TRACT
  Filled 2021-04-19 (×3): qty 2.5

## 2021-04-19 MED ORDER — MORPHINE SULFATE (PF) 2 MG/ML IV SOLN
1.0000 mg | Freq: Once | INTRAVENOUS | Status: AC | PRN
  Administered 2021-04-19: 1 mg via INTRAVENOUS
  Filled 2021-04-19: qty 1

## 2021-04-19 MED ORDER — SODIUM CHLORIDE 0.9% IV SOLUTION: Freq: Once | INTRAVENOUS | Status: DC

## 2021-04-19 MED ORDER — PANTOPRAZOLE INFUSION (NEW) - SIMPLE MED
8.0000 mg/h | INTRAVENOUS | Status: DC
Start: 2021-04-19 — End: 2021-04-20
  Administered 2021-04-19 (×2): 8 mg/h via INTRAVENOUS
  Filled 2021-04-19: qty 100
  Filled 2021-04-19 (×2): qty 80
  Filled 2021-04-19 (×2): qty 100

## 2021-04-19 MED ORDER — GLIPIZIDE 2.5 MG HALF TABLET: 2.5000 mg | ORAL_TABLET | Freq: Every day | ORAL | Status: DC

## 2021-04-19 MED ORDER — CLONIDINE HCL 0.1 MG PO TABS
0.1000 mg | ORAL_TABLET | Freq: Every day | ORAL | Status: DC
Start: 2021-04-21 — End: 2021-04-20

## 2021-04-19 MED ORDER — DILTIAZEM HCL 25 MG/5ML IV SOLN
5.0000 mg | Freq: Four times a day (QID) | INTRAVENOUS | Status: DC | PRN
  Filled 2021-04-19: qty 5

## 2021-04-19 MED ORDER — PANTOPRAZOLE SODIUM 40 MG IV SOLR
40.0000 mg | Freq: Two times a day (BID) | INTRAVENOUS | Status: DC
Start: 2021-04-22 — End: 2021-04-20

## 2021-04-19 MED ORDER — HYDROMORPHONE HCL 1 MG/ML IJ SOLN
0.5000 mg | INTRAMUSCULAR | Status: DC | PRN
  Administered 2021-04-19 – 2021-04-21 (×11): 0.5 mg via INTRAVENOUS
  Filled 2021-04-19 (×2): qty 0.5
  Filled 2021-04-19: qty 1
  Filled 2021-04-19 (×3): qty 0.5
  Filled 2021-04-19: qty 1
  Filled 2021-04-19 (×5): qty 0.5

## 2021-04-19 NOTE — Assessment & Plan Note (Addendum)
Significant drop in hemoglobin with large maroon bowel movement on 04/19/21.  Was on Eliquis as outpatient.  No further bleeding.  No intervention planned.

## 2021-04-19 NOTE — Assessment & Plan Note (Addendum)
Secondary to acute GI bleeding.  Currently on comfort care.  No further bleeding

## 2021-04-19 NOTE — Progress Notes (Signed)
Chaplain provided support to Caylee's family at her bedside.  Chaplain engaged in narrative pastoral care through learning about [place] and hearing her story through the lens of her family.  Chaplain learned that Vidalia grew up in an orphanage during the [place] Depression with some of sisters after their mom passed.  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  She loved singing in the [place] [employer] [place] choir and being apart of services despite not converting to [employer].  Larena's family described her as being loving and caring.  They shared her love of butterflies and the vision of butterflies and a white horse last night.   Chaplain offered listening and presence.  Chaplain offered prayer with them.  They were grateful for [facility] to have some spiritual support because she had not been able to go to church in [place].      04/19/21 1300  Clinical Encounter Type  Visited With Patient and family together  Visit Type Spiritual support  Referral From Family;Nurse  Consult/Referral To Chaplain  Spiritual Encounters  Spiritual Needs Prayer;Emotional;Grief support

## 2021-04-19 NOTE — Progress Notes (Signed)
Initial Nutrition Assessment  DOCUMENTATION CODES:   Obesity unspecified  INTERVENTION:  - will monitor GOC.   NUTRITION DIAGNOSIS:   Inadequate oral intake related to lethargy/confusion as evidenced by other (comment) (RN report).  GOAL:   Other (Comment) (to align with GOC)  MONITOR:   Labs, Weight trends, Other (Comment) (GOC)  REASON FOR ASSESSMENT:   Malnutrition Screening Tool  ASSESSMENT:   50 female with medical history of dementia, stage IV CKD, HTN, type 2 DM, CHF, PAF on Eliquis, COPD, moderate aortic stenosis, and chronic anemia with baseline hemoglobin 8-9. She presented to the ED on 2/14 with shortness of breath and new onset wheezing. She was hospitalized in 03/2021 due to RLL PNA. In the ED she was noted to have afib with RVR, COPD exacerbation, mild dehydration, AKI, and hypercalcemia.  Outside of patient's room as RN and Caregiver discussed current mentation, decline from 2/15, and likely trajectory and plan moving forward.   Patient is current DNR. She is limitedly responsive and noted to be a/o to self only. She is unable to/unsafe to swallow. All medications have been changed from oral route to alternative route.   She is noted to have eaten 50% of dinner last night.   She has not been assessed by a [place] RD since 10/26/2018.  Weight today is stable from admission (2/14) weight and -5.5 lb compared to weight on 04/02/21. This indicates 3.4% body weight loss in 2 weeks.   Labs reviewed; CBG: 335 mg/dl, Na: 134 mmol/l, Cl: 93 mmol/l, BUN: 83 mg/dl, creatinine: 2.14 mg/dl, Ca: 11.3 mg/dl, GFR: 21 ml/min.   Medications reviewed; 5 mg glucotrol/day, 2.5 mg glucotrol/day starting 2/17, sliding scale novolog, 7 units novolog TID, 8 mg/hr IV protonix.    NUTRITION - FOCUSED PHYSICAL EXAM:  Deferred.   Diet Order:   Diet Order             Diet regular Room service appropriate? Yes; Fluid consistency: Thin  Diet effective now                    EDUCATION NEEDS:   No education needs have been identified at this time  Skin:  Skin Assessment: Reviewed RN Assessment  Last BM:  2/16 (type 6 x2, one medium amount and one large amount)  Height:   Ht Readings from Last 1 Encounters:  04/17/21 4\' 11"  (1.499 m)    Weight:   Wt Readings from Last 1 Encounters:  04/19/21 70.1 kg     BMI:  Body mass index is 31.21 kg/m.   Estimated Nutritional Needs:  Kcal:  1300-1500 kcal Protein:  65-80 grams Fluid:  >/= 1.5 L/day     Jarome Matin, MS, RD, LDN Inpatient Clinical Dietitian RD pager # available in [place]  After hours/weekend pager # available in [facility]

## 2021-04-19 NOTE — Consult Note (Signed)
[facility] [place] Nurse Consult Note: Patient receiving care in [place]. Primary RN in room assisting with wound evaluation. Reason for Consult: "pressure ulcer" Wound type: the upper left buttock has what appears to be a skin tear, no a PI. Pressure Injury POA: Yes Measurement: approximately quarter size Wound bed: pink/white Drainage (amount, consistency, odor) none Periwound: intact Dressing procedure/placement/frequency: Apply or reapply a small foam dressing to the left upper buttock skin tear area. Change every 3 days and prn.  Monitor the wound area(s) for worsening of condition such as: Signs/symptoms of infection,  Increase in size,  Development of or worsening of odor, Development of pain, or increased pain at the affected locations.  Notify the medical team if any of these develop.  Thank you for the consult.  Discussed plan of care with the bedside nurse.  [place] nurse will not follow at this time.  Please re-consult the [place] team if needed.  Val Riles, RN, MSN, CWOCN, CNS-BC, pager [phone]

## 2021-04-19 NOTE — Progress Notes (Addendum)
Progress Note  Patient Name: Sabrina [name] Date of Encounter: 04/19/2021  [facility] Cardiologist: Mertie Moores, MD   Subjective   Patient became a little agitated overnight and had to have mittens placed. Caregiver Basilia Jumbo is at bedside. Rosa states her breathing is much better than when she came in. Patient is on 2L via nasal cannula which is what she is on at home. Patient denies any chest pain. Maintaining sinus rhythm with rates in the low 100s.  Inpatient Medications    Scheduled Meds:  apixaban  2.5 mg Oral BID   Chlorhexidine Gluconate Cloth  6 each Topical Daily   cloNIDine  0.2 mg Oral BID   diltiazem  60 mg Oral Q8H   famotidine  20 mg Oral Daily   feeding supplement  237 mL Oral BID BM   glipiZIDE  2.5 mg Oral QAC supper   glipiZIDE  5 mg Oral QAC breakfast   hydrALAZINE  50 mg Oral Q8H   insulin aspart  0-5 Units Subcutaneous QHS   insulin aspart  0-9 Units Subcutaneous TID WC   insulin aspart  7 Units Subcutaneous TID WC   ipratropium  0.5 mg Nebulization TID   isosorbide mononitrate  30 mg Oral Daily   levalbuterol  1.25 mg Nebulization TID   polyethylene glycol  17 g Oral Daily   predniSONE  20 mg Oral Q breakfast   Continuous Infusions:  cefTRIAXone (ROCEPHIN)  IV Stopped (04/18/21 0939)   PRN Meds: acetaminophen **OR** acetaminophen, bisacodyl, hydrALAZINE, levalbuterol   Vital Signs    Vitals:   04/19/21 0400 04/19/21 0500 04/19/21 0550 04/19/21 0600  BP: (!) 141/44 (!) 146/74 (!) 147/55   Pulse: (!) 106     Resp: (!) 32 (!) 23  (!) 29  Temp: 98.1 F (36.7 C)     TempSrc: Axillary     SpO2: 97%   98%  Weight:  70.1 kg    Height:        Intake/Output Summary (Last 24 hours) at 04/19/2021 0724 Last data filed at 04/19/2021 0600 Gross per 24 hour  Intake 636.69 ml  Output 761 ml  Net -124.31 ml   Last 3 Weights 04/19/2021 04/17/2021 04/17/2021  Weight (lbs) 154 lb 8.7 oz 155 lb 10.3 oz 160 lb 0.9 oz  Weight (kg) 70.1 kg 70.6 kg 72.6  kg      Telemetry    Sinus rhythm with rates in the 90s to low 100s. Occasional PACs/PVCs noted. - Personally Reviewed  ECG    No new ECG tracing today. - Personally Reviewed  Physical Exam   GEN: No acute distress.   Neck: No JVD. Cardiac: Mildly tachycardic with regular rhythm. Soft murmur noted. No rubs or gallops.  Respiratory: No increased work of breathing. Decreased breath sounds throughout but no wheezes, rhonchi, or rales appreciated. GI: Soft, non-distended, and non-tender. MS: No lower extremity edema. No deformity. Skin: warm and dry. Neuro:  No focal deficits. Psych: Underlying dementia.  Labs    High Sensitivity Troponin:   Recent Labs  Lab 04/17/21 1643 04/17/21 2000 04/18/21 0715 04/18/21 0942  TROPONINIHS 39* 95* 182* 174*     Chemistry Recent Labs  Lab 04/17/21 1643 04/17/21 1704 04/18/21 0251 04/19/21 0243  NA 130* 129* 130* 134*  K 4.5 4.6 4.4 4.3  CL 87* 90* 89* 93*  CO2 32  --  29 33*  GLUCOSE 412* 412* 422* 350*  BUN 81* 85* 77* 83*  CREATININE 2.47* 2.50* 2.31*  2.14*  CALCIUM 11.5*  --  11.1* 11.3*  MG 2.2  --  2.4  --   PROT 7.3  --  6.6  --   ALBUMIN 3.6  --  3.0*  --   AST 14*  --  14*  --   ALT 12  --  11  --   ALKPHOS 79  --  70  --   BILITOT 0.5  --  0.3  --   GFRNONAA 18*  --  19* 21*  ANIONGAP 11  --  12 8    Lipids No results for input(s): CHOL, TRIG, HDL, LABVLDL, LDLCALC, CHOLHDL in the last 168 hours.  Hematology Recent Labs  Lab 04/17/21 1643 04/17/21 1704 04/18/21 0251  WBC 18.6*  --  12.6*  RBC 2.96*  --  2.63*  HGB 9.3* 10.2* 8.2*  HCT 29.5* 30.0* 26.0*  MCV 99.7  --  98.9  MCH 31.4  --  31.2  MCHC 31.5  --  31.5  RDW 15.1  --  15.0  PLT 314  --  235   Thyroid No results for input(s): TSH, FREET4 in the last 168 hours.  BNP Recent Labs  Lab 04/17/21 1643 04/19/21 0243  BNP 257.1* 903.1*    DDimer No results for input(s): DDIMER in the last 168 hours.   Radiology    DG Chest Port 1  View  Result Date: 04/17/2021 CLINICAL DATA:  Short of breath.  Respiratory distress EXAM: PORTABLE CHEST 1 VIEW COMPARISON:  03/29/2021 FINDINGS: Heart size and vascularity normal. Improved aeration in the right lung base which is now clear. Left lower lobe remains clear. Heart size normal. Cardiac loop recorder noted. Surgical clips right axilla. IMPRESSION: Interval improvement in right lower lobe airspace disease and right effusion. Lungs now clear. Electronically Signed   By: Franchot Gallo M.D.   On: 04/17/2021 17:34    Cardiac Studies   Echocardiogram 03/10/2021: Impressions: 1. Left ventricular ejection fraction, by estimation, is 60 to 65%. The  left ventricle has normal function. The left ventricle has no regional  wall motion abnormalities.   2. Right ventricular systolic function is normal. The right ventricular  size is normal.   3. MV is thickened. Peakd and mean gradients through the valve are 13 and  6 mm Hg with MVA by VTI of 1.5 cm2 consistent with mild to moderate MS. No  significant change in gradients from report from 12/19/20. . The mitral  valve is degenerative. Trivial  mitral valve regurgitation. Moderate to severe mitral annular  calcification.   4. AV is thickened, calcified with restricted motion. Peak and mean  gradients through the valve are 49 and 27 mm HG respectively. AVA (VTI)  1.04 cm2 Dimensionless index is 0.37. Consistent with moderate AS. No  significant change from mean gradient from  12/19/20 (25 to 27 mm Hg). Aortic valve regurgitation is mild.   5. The inferior vena cava is dilated in size with <50% respiratory  variability, suggesting right atrial pressure of 15 mmHg.   Patient Profile     88 y.o. female with a history of paroxysmal atrial fibrillation on Eliquis, chronic diastolic CHF, moderate aortic stenosis, COPD, hypertension, hyperlipidemia, type 2 diabetes mellitus, stage IV CKD, prior stroke s/p implantable loop recorder, prior PE/DVT,  chronic anemia, and dementia who was admitted on 04/17/2021 for acute hypoxic respiratory failure secondary to acute COPD exacerbation.  Cardiology was consulted for further evaluation of atrial fibrillation with RVR at the request of Dr.  Kc.  Assessment & Plan    Paroxysmal Atrial Fibrillation Patient admitted with acute hypoxic respiratory failure and was found to be tachycardic with rates in the 140s. There was concern that presenting rhythm was rapid atrial fibrillation. EKG automatically read as atrial fibrillation with RVR; however, clear P waves can be seen. I think it is more consistent with sinus tachycardia. Recent Echo showed normal LV function. Magnesium and potassium normal. She was started on IV Cardizem with improvement in rates so this was able to be stopped. - Maintaining sinus rhythm with rates in the 90s to low 100s. - Continue PO Cardizem 60mg  three times daily. - Continue chronic anticoagulation with Eliquis 2.5mg  twice daily (reduced dosing due to age and renal function). - Tachycardia likely secondary underlying COPD exacerbation. Suspect rates to improve as this is treated. However, we can increase Cardizem if needed.   Demand Ischemia High-sensitivity troponin 39 >> 95 >> 182 >> 174. EKG did shows diffuse ST depressions but suspect some of this was rate related. Recent Echo showed normal LV function. - No chest pain. - Troponin elevation consistent with demand ischemia secondary to hypertensive urgency and acute hypoxic respiratory failure. No additional work-up recommended given advanced age, underlying CKD stage IV, dementia, and the fact that patient has transitioned to hospice as an outpatient.   Chronic Diastolic CHF BNP mildly elevated at 257 on admission. Chest x-ray showed no overt edema and actual interval improvement in right lower lobe airspace disease and right effusion. Recent Echo in 03/2021 showed LVEF of 60-65% with grade 1 diastolic dysfunction, moderate AS,  and mild to moderate MS. Net negative 564 mL this admission. - Repeat BNP today elevated in the 900s. Repeat chest-ray today is stable with no new or worsening focal airspace disease. - She does not appear significantly volume overloaded at this time. No need for diuretics.  - Continue to monitor volume status closely.   Hypertensive Urgency Systolic BP as high as 427 on admission. Caregiver states systolic BP is usually in the 110s to 150s at home but per chart review she has history of hypertensive urgency in setting of COPD exacerbations. BP much improved. Last BP reading 147/55. - Continue home PO Cardizem 60mg  three times daily and Imdur 30mg  daily. - Continue Hydralazine 50mg  three times daily. - Continue Clonidine 0.2mg  twice daily. - No ACEI/ARB/ARNI or Spironolactone due to renal function. - Can continue to adjust medications as needed.   CKD Stage IV Creatinine 2.47 on admission. Baseline around 2.0 to 2.5.  - Creatinine stable at 2.14 today. - Continue to monitor closely.   Of note, caregiver reports patient has transitioned to hospice as an outpatient.   Otherwise, per primary team: - Acute COPD exacerbation - Leukocytosis - Hyperlipidemia - Type 2 diabetes mellitus - Chronic anemia - Dementia  For questions or updates, please contact [facility] Please consult [email or web address] for contact info under        Signed, Darreld Mclean, PA-C  04/19/2021, 7:24 AM    History and all data above reviewed.  Patient examined.  I agree with the findings as above.  She was agitated last night and now is very somnolent.  Unable to take PO meds.  Melanic stool.   Acute drop in Hgb.  The patient exam reveals COR: RRR  ,  Lungs: Decreased breath sounds,  Abd: Positive bowel sounds, no rebound no guarding, Ext No edema  .  All available labs, radiology testing, previous records reviewed. Agree with documented  assessment and plan.   Acute GI bleed:  Transfusions ordered. .  Stopped  Eliquis.  Changing PO meds to IV prn and changing to clonidine patch.    Jeneen Rinks [facility]  11:38 AM  04/19/2021

## 2021-04-19 NOTE — Consult Note (Signed)
Consultation Note Date: 04/19/2021   Patient Name: Sabrina [name]  DOB: 04/28/1926  MRN: [id number]  Age / Sex: 88 y.o., female  PCP: [name], MD Referring Physician: Antonieta Pert, MD  Reason for Consultation: Establishing goals of care  HPI/Patient Profile: 88 y.o. female  admitted on 04/17/2021   21 F w / h/o dementia, stage IV chronic kidney disease with baseline creatinine 2-2.5, hypertension, type 2 diabetes mellitus, chronic diastolic heart failure, PAF on Eliquis, COPD, moderate aortic stenosis, chronic anemia with baseline hemoglobin 8-9 presented to ED 2/14 with shortness of breath with 1 day history of progressive worsening shortness of breath, new onset wheezing.  Recently hospitalized in January for right lower lobe pneumonia.   Clinical Assessment and Goals of Care:  Patient lives at home with round the clock caregivers, more recently, she was enrolled in hospice services. A palliative consult has been requested for additional assistance. Hospice liaisons are following.  Patient noted to be lethargic, mild distress, caregiver at bedside, initially discussed with niece [name] on phone.  Arrived on unit and met with niece Caryl Asp, she spoke with hospice liaison, comfort measures has been selected, PRBC discontinued. Awaiting residential hospice bed. Appreciate hospice liaison.   HCPOA  Niece [name]  SUMMARY OF RECOMMENDATIONS    DNR DNI Comfort measures PRN IV Dilaudid for pain and dyspnea management Transfer to residential hospice when bed available.  Thank you for the consult.   Code Status/Advance Care Planning: DNR   Symptom Management:     Palliative Prophylaxis:  Delirium Protocol  Additional Recommendations (Limitations, Scope, Preferences): Full Comfort Care  Psycho-social/Spiritual:  Desire for further Chaplaincy support:yes Additional Recommendations: Education on  Hospice  Prognosis:  < 2 weeks  Discharge Planning: Hospice facility      Primary Diagnoses: Present on Admission:  Acute exacerbation of chronic obstructive pulmonary disease (COPD) (HCC)  Paroxysmal atrial fibrillation with RVR (HCC)  Dehydration  Hypercalcemia  Chronic diastolic congestive heart failure (HCC)  Acute renal failure superimposed on stage 4 chronic kidney disease (HCC)  Acute respiratory failure with hypoxia (HCC)  Type 2 diabetes mellitus with stage 4 chronic kidney disease (HCC)  Elevated troponin  Hypertensive urgency  Acute blood loss anemia   I have reviewed the medical record, interviewed the patient and family, and examined the patient. The following aspects are pertinent.  Past Medical History:  Diagnosis Date   Arthritis    "mostly in my hands, lower back" (06/25/2017)   Basal cell carcinoma (BCC) of face 1983   Breast cancer, right breast ([place]) 03/2013   Chronic kidney disease (CKD), stage III (moderate) ([place])    nephrologist, Dr. [name]   Dental crowns present    DVT (deep venous thrombosis) ([place]) ~ 06/2013   "? side"   Gout    "on daily RX" (06/25/2017)   Heart murmur    no known problems; states did not know she had murmur until age 16   High cholesterol    Hypertension    fluctuates, especially when stressed;  has been on med. > 20 yr.   Immature cataract    Non-insulin dependent type 2 diabetes mellitus ([place])    Personal history of chemotherapy    Personal history of radiation therapy    Pulmonary embolism ([place]) ~ 06/2013   Radiation 09/06/13-10/20/13   Right Breast Cancer   Stroke ([place]) 05/2017   just visual problems since (06/25/2017)   Wears partial dentures    lower   Social History   Socioeconomic History   Marital status: Married    Spouse name: Not on file   Number of children: 0   Years of education: Not on file   Highest education level: Bachelor's degree (e.g., BA, AB, BS)  Occupational History   Not on file   Tobacco Use   Smoking status: Never   Smokeless tobacco: Never   Tobacco comments:    only smoked 2 packs cigarettes total; husband quit in 1971  Vaping Use   Vaping Use: Never used  Substance and Sexual Activity   Alcohol use: Not Currently    Alcohol/week: 3.0 standard drinks    Types: 3 Glasses of wine per week   Drug use: No   Sexual activity: Not Currently  Other Topics Concern   Not on file  Social History Narrative   Lives at home with her husband   Right handed   Caffeine: 1 coffee daily at most    Social Determinants of Health   Financial Resource Strain: Not on file  Food Insecurity: Not on file  Transportation Needs: Not on file  Physical Activity: Not on file  Stress: Not on file  Social Connections: Not on file   Family History  Problem Relation Age of Onset   Pneumonia Mother    Heart attack Father    Breast cancer Other 22       niece   Breast cancer Other 17       niece   Ovarian cancer Other        niece   Scheduled Meds:  sodium chloride   Intravenous Once   Chlorhexidine Gluconate Cloth  6 each Topical Daily   cloNIDine  0.4 mg Transdermal Weekly   [START ON 04/20/2021] cloNIDine  0.1 mg Oral BID   Followed by   Derrill Memo ON 04/21/2021] cloNIDine  0.1 mg Oral Daily   cloNIDine  0.2 mg Oral BID   diltiazem  60 mg Oral Q8H   feeding supplement  237 mL Oral BID BM   hydrALAZINE  50 mg Oral Q8H   insulin aspart  0-5 Units Subcutaneous QHS   insulin aspart  0-9 Units Subcutaneous TID WC   insulin aspart  7 Units Subcutaneous TID WC   ipratropium  0.5 mg Nebulization BID   isosorbide mononitrate  30 mg Oral Daily   levalbuterol  1.25 mg Nebulization BID   [START ON 04/22/2021] pantoprazole  40 mg Intravenous Q12H   Continuous Infusions:  cefTRIAXone (ROCEPHIN)  IV Stopped (04/19/21 1043)   pantoprazole 8 mg/hr (04/19/21 1234)   PRN Meds:.acetaminophen **OR** acetaminophen, bisacodyl, diltiazem, hydrALAZINE, hydrALAZINE, HYDROmorphone (DILAUDID)  injection, levalbuterol Medications Prior to Admission:  Prior to Admission medications   Medication Sig Start Date End Date Taking? Authorizing Provider  acetaminophen (TYLENOL) 500 MG tablet Take 500 mg by mouth daily as needed for fever or moderate pain (for foot pain).   Yes [provider]  albuterol (VENTOLIN HFA) 108 (90 Base) MCG/ACT inhaler Inhale 1-2 puffs into the lungs every 6 (six) hours as  needed for wheezing or shortness of breath. 03/22/19  Yes [provider]  allopurinol (ZYLOPRIM) 100 MG tablet Take 100 mg by mouth daily. 01/17/16  Yes [provider]  cholecalciferol (VITAMIN D) 1000 UNITS tablet Take 1,000 Units by mouth daily.   Yes [provider]  cloNIDine (CATAPRES) 0.2 MG tablet Take 0.2 mg by mouth 2 (two) times daily.   Yes [provider]  diltiazem (CARDIZEM) 60 MG tablet Take 1 tablet (60 mg total) by mouth every 8 (eight) hours. 02/13/21 04/18/21 Yes Arrien, Jimmy Picket, MD  ELIQUIS 2.5 MG TABS tablet TAKE 1 TABLET BY MOUTH TWICE DAILY. 07/04/20  Yes Fenton, Clint R, PA  famotidine (PEPCID) 20 MG tablet Take 20 mg by mouth daily as needed for heartburn. 11/09/19  Yes [provider]  furosemide (LASIX) 40 MG tablet Take 40 mg by mouth daily.   Yes [provider]  glipiZIDE (GLUCOTROL) 5 MG tablet Take 1 tablet (5 mg total) by mouth See admin instructions. Takes 1 tab every am and 1/2 tab every evening 04/02/21  Yes Georgette Shell, MD  hydrALAZINE (APRESOLINE) 50 MG tablet Take 50 mg by mouth 3 (three) times daily.   Yes [provider]  ipratropium-albuterol (DUONEB) 0.5-2.5 (3) MG/3ML SOLN Take 3 mLs by nebulization every 6 (six) hours as needed (wheezing and shortness of breath). 02/13/21  Yes Arrien, Jimmy Picket, MD  isosorbide mononitrate (IMDUR) 30 MG 24 hr tablet Take 1 tablet (30 mg total) by mouth daily. 12/22/20 02/07/22 Yes Pokhrel, Laxman, MD  nystatin (MYCOSTATIN) 100000  UNIT/ML suspension Use as directed 5 mLs in the mouth or throat 2 (two) times daily. 03/22/21  Yes [provider]  ondansetron (ZOFRAN) 4 MG tablet Take 1 tablet (4 mg total) by mouth every 6 (six) hours as needed for nausea. 04/02/21  Yes Georgette Shell, MD  polyethylene glycol (MIRALAX / GLYCOLAX) 17 g packet Take 17 g by mouth daily.   Yes [provider]  amoxicillin-clavulanate (AUGMENTIN) 875-125 MG tablet Take 1 tablet by mouth 2 (two) times daily. Patient not taking: Reported on 04/18/2021    [provider]  benzonatate (TESSALON) 100 MG capsule Take 1 capsule (100 mg total) by mouth every 8 (eight) hours. Patient not taking: Reported on 04/18/2021 03/04/21   Domenic Moras, PA-C  Morphine Sulfate (MORPHINE CONCENTRATE) 10 mg / 0.5 ml concentrated solution Take 5 mg by mouth every 4 (four) hours as needed for shortness of breath. Patient not taking: Reported on 04/18/2021 04/17/21   [provider]  TRUE METRIX BLOOD GLUCOSE TEST test strip  07/19/19   [provider]  TRUEplus Lancets 28G MISC Apply topically. 08/20/19   [provider]   Allergies  Allergen Reactions   Beta Adrenergic Blockers Other (See Comments)    Fatigue   Review of Systems +weakness + non verbal gestures of pain.   Physical Exam Lethargic appearing elderly lady Mild distress Diminished breath sounds S 1 S 2  Monitor noted No edema Abdomen not distended  Vital Signs: BP (!) 150/28    Pulse 95    Temp 99.3 F (37.4 C) (Oral)    Resp 15    Ht 4' 11"  (1.499 m)    Wt 70.1 kg    SpO2 98%    BMI 31.21 kg/m  Pain Scale: PAINAD       SpO2: SpO2: 98 % O2 Device:SpO2: 98 % O2 Flow Rate: .O2 Flow Rate (L/min): 2 L/min  IO:  Intake/output summary:  Intake/Output Summary (Last 24 hours) at 04/19/2021 1456 Last data filed at 04/19/2021 0600 Gross per 24 hour  Intake 206.08 ml  Output 681 ml  Net -474.92 ml    LBM:   Baseline Weight: Weight: 72.6  kg Most recent weight: Weight: 70.1 kg     Palliative Assessment/Data:   PPS 20%  Time In:  12 Time Out:  1300 Time Total:  60  Greater than 50%  of this time was spent counseling and coordinating care related to the above assessment and plan.  Signed by: Loistine Chance, MD   Please contact Palliative Medicine Team phone at [phone] for questions and concerns.  For individual provider: See Shea Evans

## 2021-04-19 NOTE — Progress Notes (Signed)
[facility]) hospitalized hospice patient visit.   This is a current recently enrolled (04/15/2021) hospice patient with a terminal diagnosis of Cerebrovascular disease.  Family activated EMS when patient became extremely short of breath. Patient was admitted to hospital on 04/17/21 with a diagnosis of Acute exacerbation of chronic obstructive pulmonary disease. Per Dr. Karie Georges with [facility] this is a related admission.   Visited patient at bedside along with Niece Caryl Asp and both caregivers. Patient was lethargic, however was able to respond to voice at times. She appeared comfortable but at times would moan out and seemed uncomfortable. I discussed goals of care and Sabrina [name] did decide that she would now like to do full comfort measures and cancel the transfusions at this time. PMT, MD, and bedside RN aware and orders were placed. Pastoral services were also requested. We will look into placement for [facility], I assured them that it would not be today as there are no beds available.    This patient is appropriate for inpatient due to need for symptom management and EOL care.    VS: 99.0, 150/28, 95, 15, 98% 2Lnc I/O: ? Not documented   Abnormal labs:  Latest Reference Range & Units 04/19/21 02:43 04/19/21 10:24  Sodium 135 - 145 mmol/L 134 (L)   Potassium 3.5 - 5.1 mmol/L 4.3   Chloride 98 - 111 mmol/L 93 (L)   CO2 22 - 32 mmol/L 33 (H)   Glucose 70 - 99 mg/dL 350 (H)   BUN 8 - 23 mg/dL 83 (H)   Creatinine 0.44 - 1.00 mg/dL 2.14 (H)   Calcium 8.9 - 10.3 mg/dL 11.3 (H)   Anion gap 5 - 15  8   GFR, Estimated >60 mL/min 21 (L)   B Natriuretic Peptide 0.0 - 100.0 pg/mL 903.1 (H)   WBC 4.0 - 10.5 K/uL  23.1 (H)  RBC 3.87 - 5.11 MIL/uL  1.81 (L)  Hemoglobin 12.0 - 15.0 g/dL  5.7 (LL)  HCT 36.0 - 46.0 %  17.5 (L)  MCV 80.0 - 100.0 fL  96.7  MCH 26.0 - 34.0 pg  31.5  MCHC 30.0 - 36.0 g/dL  32.6  RDW 11.5 - 15.5 %  15.4  Platelets 150 - 400 K/uL  274  nRBC 0.0 - 0.2  %  0.1     Diagnostics: CXR IMPRESSION: Interval improvement in right lower lobe airspace disease and right effusion. Lungs now clear.   IV/PRN medications:  HYDROmorphone (DILAUDID) injection 0.5 mg Dose: 0.5 mg Freq: Every 30 min PRN Route: IV cloNIDine (CATAPRES - Dosed in mg/24 hr) patch 0.4 mg Dose: 0.4 mg Freq: Weekly Route: TD  Problem list: Assessment and Plan: ** Acute exacerbation of chronic obstructive pulmonary disease (COPD) ([place])- (present on admission) Chest x-ray with improvement, repeat chest x-ray stable.  Now on 2 L nasal cannula respiration rate improving.  No wheezing will discontinue steroid, and also given uncontrolled hyperglycemia.COVID-19 and influenza PCR negative.  Procalcitonin elevated and treating for associated bacterial bronchitis. cont bronchodilators as heart rate tolerates, supplemental oxygen, PT OT OOB   Acute respiratory failure with hypoxia ([place])- (present on admission) Initially needing up to 10 L nasal cannula.  Currently on 2 L.  Overall improving.   Acute GI bleeding Significant drop in hemoglobin with large maroon bowel movement 2/16.  Discussed with patient's niece POA she would like to continue with blood transfusion and supportive care no other invasive procedures.  We will switch to PPI twice daily  discontinue Eliquis she is unlikely going to be a candidate for anticoagulation moving forward.        Recent Labs  Lab 04/17/21 1643 04/17/21 1704 04/18/21 0251 04/19/21 1024  HGB 9.3* 10.2* 8.2* 5.7*  HCT 29.5* 30.0* 26.0* 17.5*      Anemia due to chronic kidney disease Now with acute blood loss anemia transfusing see acute GI bleeding    Acute blood loss anemia- (present on admission) See acute GI bleeding   Lethargy Appears lethargic this morning holding p.o. meds.  Continue with supportive care aspiration precautions.   Paroxysmal atrial fibrillation with RVR ([place])- (present on admission) Now in normal sinus rhythm  continue oral Cardizem, Eliquis.  Appreciate cardiology input     Goals of care, counseling/discussion Patient was recently enrolled in hospice at the facility.  Remains DNR.  Palliative care consulted to facilitate.  Now patient with active GI bleed severe anemia with multiple comorbidities, discussed with patient's niece POA-she wants to continue supportive care with antibiotics, blood transfusion.  Patient did not want to go to inpatient hospice she has 24-hour care at home.Overall prognosis is poor   Hypertensive urgency- (present on admission) BP this morning well controlled patient is back on home hydralazine, clonidine Cardizem and Imdur-if unable to take p.o. we will switch clonidine to patch-pharmacy consulted   Atrial fibrillation with RVR ([place]) Admitted on Cardizem drip, transition to p.o. Cardizem, rate improving continue anticoagulation with Eliquis   Elevated troponin- (present on admission) Likely demand ischemia in the setting of A-fib RVR hypoxia.  Repeat troponin 174. Continue anticoagulation, Imdur, monitor appreciate cardiology input   Leucocytosis Continue empiric antibiotics WBC count downtrending       Recent Labs  Lab 04/17/21 1643 04/17/21 2000 04/18/21 0251  WBC 18.6*  --  12.6*  PROCALCITON  --  0.73  --       Chronic diastolic congestive heart failure ([place])- (present on admission) Monitor fluid status intake output.  Compared to recent admissions weight has improved as below-Lasix on hold in the setting of AKI and dehydration Net IO Since Admission: -564.78 mL [04/19/21 0901]     Wt Readings from Last 3 Encounters:  04/19/21 70.1 kg  04/02/21 72.6 kg  03/14/21 75.1 kg      Acute renal failure superimposed on stage 4 chronic kidney disease ([place])- (present on admission) Recent baseline creatinine up to 1.7- mid 2s.  Overall stable at Tuesday likely this is baseline.  Monitor       Recent Labs  Lab 04/17/21 1643 04/17/21 1704 04/18/21 0251  04/19/21 0243  BUN 81* 85* 77* 83*  CREATININE 2.47* 2.50* 2.31* 2.14*      Hypercalcemia- (present on admission) Calcium 11.1.  Albumin 3-3.6.Monitor electrolytes-cont w/ hydration as tolerated.  Check PTH and vitamin D level       Recent Labs  Lab 04/17/21 1643 04/17/21 1704 04/18/21 0251 04/19/21 0243  K 4.5 4.6 4.4 4.3  CALCIUM 11.5*  --  11.1* 11.3*  MG 2.2  --  2.4  --   PHOS  --   --  3.4  --       Dehydration- (present on admission) Encourage oral intake.   Obesity, Class I, BMI 30-34.9 Will benefit with PCP follow-up, weight loss-doubt this is feasible at current situation.   Hyponatremia Slightly low.  Monitor.  Component of pseudohyponatremia from hyperglycemia       Recent Labs  Lab 04/17/21 1643 04/17/21 1704 04/18/21 0251 04/19/21 0243  NA 130*  129* 130* 134*      Type 2 diabetes mellitus with stage 4 chronic kidney disease ([place])- (present on admission) Uncontrolled hyperglycemia in the setting of steroid use, added Premeal insulin, continue SSI, home glipizide.  ADD ONE TIME LANTUS. Tapering ofF steroid quickly        Recent Labs  Lab 04/18/21 1142 04/18/21 1641 04/18/21 2127 04/18/21 2226 04/19/21 0820  GLUCAP 316* 376* 340* 340* 335*        Discharge planning: [place] referral Family Contact: Met with Sabrina [name] and Caregivers at bedside  IDG:  Updated Goals of care:  DNR. Clear. Full Comfort Care as of today   Transfer Summary and medication list placed on shadow chart.    Should patient need ambulance transport at discharge please use [facility] as [facility] contracts this service with them for our active hospice patients.     Please do not hesitate to call with questions.    Thank you,   Clementeen Hoof, BSN, RN    [facility] Liaison   [phone]

## 2021-04-19 NOTE — Progress Notes (Signed)
[facility] Hospitalist PROGRESS NOTE  Sabrina [name]  WUJ:[id number] DOB: 06/03/1926 DOA: 04/17/2021 PCP: [name], MD   Brief Narrative/Hospital Course: Sabrina [name], 88 y.o. female  28 F w / h/o dementia, stage IV chronic kidney disease with baseline creatinine 2-2.5, hypertension, type 2 diabetes mellitus, chronic diastolic heart failure, PAF on Eliquis, COPD, moderate aortic stenosis, chronic anemia with baseline hemoglobin 8-9 presented to ED 2/14 with shortness of breath with 1 day history of progressive worsening shortness of breath, new onset wheezing.  Recently hospitalized in January for right lower lobe pneumonia.  Patient W/ poor historrian w/ dementia. In the ED-lab showed leukocytosis BNP 257 stable high-sensitivity troponin 39> 95 previously 169, EKG with A-fib RVR, chest x-ray resolution of right lower lobe airspace disease-heart rate in 140s in aVR given Cardizem bolus and placed on drip IV fluids bronchodilators and admitted to stepdown for A-fib RVR, COPD exacerbation, acute hypoxic respiratory failure, mild dehydration, AKI hypercalcemia. Further lab with Pro-Cal 0.7 COVID-19 negative  Subjective:  Seen and examined this morning. Caregiver at the bedside.  Patient is lethargic whorling oral medication Blood pressure stable ON 2L [place].  She is in sinus rhythm, afebrile overnight This morning patient had a large maroon-colored BM per nursing staff Repeat h/h at 5.7 gm.  Assessment and Plan: * Acute exacerbation of chronic obstructive pulmonary disease (COPD) ([place])- (present on admission) Chest x-ray with improvement, repeat chest x-ray stable.  Now on 2 L nasal cannula respiration rate improving.  No wheezing will discontinue steroid, and also given uncontrolled hyperglycemia.COVID-19 and influenza PCR negative.  Procalcitonin elevated and treating for associated bacterial bronchitis. cont bronchodilators as heart rate tolerates, supplemental oxygen, PT OT OOB  Acute  respiratory failure with hypoxia ([place])- (present on admission) Initially needing up to 10 L nasal cannula.  Currently on 2 L.  Overall improving.  Acute GI bleeding Significant drop in hemoglobin with large maroon bowel movement 2/16.  Discussed with patient's niece POA she would like to continue with blood transfusion and supportive care no other invasive procedures.  We will switch to PPI twice daily discontinue Eliquis she is unlikely going to be a candidate for anticoagulation moving forward.  Recent Labs  Lab 04/17/21 1643 04/17/21 1704 04/18/21 0251 04/19/21 1024  HGB 9.3* 10.2* 8.2* 5.7*  HCT 29.5* 30.0* 26.0* 17.5*    Anemia due to chronic kidney disease Now with acute blood loss anemia transfusing see acute GI bleeding   Acute blood loss anemia- (present on admission) See acute GI bleeding  Lethargy Appears lethargic this morning holding p.o. meds.  Continue with supportive care aspiration precautions.  Paroxysmal atrial fibrillation with RVR ([name])- (present on admission) Now in normal sinus rhythm continue oral Cardizem, Eliquis.  Appreciate cardiology input   Goals of care, counseling/discussion Patient was recently enrolled in hospice at the facility.  Remains DNR.  Palliative care consulted to facilitate.  Now patient with active GI bleed severe anemia with multiple comorbidities, discussed with patient's niece POA-she wants to continue supportive care with antibiotics, blood transfusion.  Patient did not want to go to inpatient hospice she has 24-hour care at home.Overall prognosis is poor  Hypertensive urgency- (present on admission) BP this morning well controlled patient is back on home hydralazine, clonidine Cardizem and Imdur-if unable to take p.o. we will switch clonidine to patch-pharmacy consulted  Atrial fibrillation with RVR ([place]) Admitted on Cardizem drip, transition to p.o. Cardizem, rate improving continue anticoagulation with Eliquis  Elevated  troponin- (present on admission) Likely  demand ischemia in the setting of A-fib RVR hypoxia.  Repeat troponin 174. Continue anticoagulation, Imdur, monitor appreciate cardiology input  Leucocytosis Continue empiric antibiotics WBC count downtrending  Recent Labs  Lab 04/17/21 1643 04/17/21 2000 04/18/21 0251  WBC 18.6*  --  12.6*  PROCALCITON  --  0.73  --     Chronic diastolic congestive heart failure ([place])- (present on admission) Monitor fluid status intake output.  Compared to recent admissions weight has improved as below-Lasix on hold in the setting of AKI and dehydration Net IO Since Admission: -564.78 mL [04/19/21 0901]  Wt Readings from Last 3 Encounters:  04/19/21 70.1 kg  04/02/21 72.6 kg  03/14/21 75.1 kg    Acute renal failure superimposed on stage 4 chronic kidney disease ([place])- (present on admission) Recent baseline creatinine up to 1.7- mid 2s.  Overall stable at Tuesday likely this is baseline.  Monitor Recent Labs  Lab 04/17/21 1643 04/17/21 1704 04/18/21 0251 04/19/21 0243  BUN 81* 85* 77* 83*  CREATININE 2.47* 2.50* 2.31* 2.14*    Hypercalcemia- (present on admission) Calcium 11.1.  Albumin 3-3.6.Monitor electrolytes-cont w/ hydration as tolerated.  Check PTH and vitamin D level Recent Labs  Lab 04/17/21 1643 04/17/21 1704 04/18/21 0251 04/19/21 0243  K 4.5 4.6 4.4 4.3  CALCIUM 11.5*  --  11.1* 11.3*  MG 2.2  --  2.4  --   PHOS  --   --  3.4  --     Dehydration- (present on admission) Encourage oral intake.  Obesity, Class I, BMI 30-34.9 Will benefit with PCP follow-up, weight loss-doubt this is feasible at current situation.  Hyponatremia Slightly low.  Monitor.  Component of pseudohyponatremia from hyperglycemia Recent Labs  Lab 04/17/21 1643 04/17/21 1704 04/18/21 0251 04/19/21 0243  NA 130* 129* 130* 134*    Type 2 diabetes mellitus with stage 4 chronic kidney disease ([place])- (present on admission) Uncontrolled hyperglycemia in  the setting of steroid use, added Premeal insulin, continue SSI, home glipizide.  ADD ONE TIME LANTUS. Tapering ofF steroid quickly Recent Labs  Lab 04/18/21 1142 04/18/21 1641 04/18/21 2127 04/18/21 2226 04/19/21 0820  GLUCAP 316* 376* 340* 340* 335*    Aortic stenosis Need outpatient follow-up   Continue to monitor in the stepdown unit, patient is a DNR family-POA not ready for comfort measures yet. Continue with supportive care with antibiotics blood transfusion, patient is at risk of further decompensation and death. Waiting for palliative care to facilitate  DVT prophylaxis: SCDs Start: 04/17/21 1933 Code Status:   Code Status: DNR Family Communication: plan of care discussed with patient/caregiver aid at bedside. Called patient's nephew no answer left message, I called patient's niece and discussed plan of care and she is aware about overall condition.  She is Visiting at 2:00 today.  Disposition: Currently  not medically stable for discharge. Status is: Inpatient Remains inpatient appropriate because: Ongoing management Anticipate discharge to home with hospice if remains stable in next 24 hours  Objective: Vitals last 24 hrs: Vitals:   04/19/21 0747 04/19/21 0800 04/19/21 0900 04/19/21 1000  BP:   (!) 148/42 (!) 150/28  Pulse:  96 (!) 102 (!) 104  Resp:  17 (!) 26 (!) 23  Temp:  99.3 F (37.4 C)    TempSrc:  Oral    SpO2: 98% 97% 98% 98%  Weight:      Height:       Weight change: -2.5 kg  Physical Examination: General exam:Sleepy and lethargic, not in distress elderly,  chronically sick looking  HEENT:Oral mucosa moist, Ear/Nose WNL grossly, dentition normal. Respiratory system:Bilaterally diminished bs, no use of accessory muscle Cardiovascular system:S1 & S2 +, No JVD,. Gastrointestinal system:Abdomen soft,NT,ND,BS+ Nervous System:Alert, awake, moving extremities and grossly nonfocal Extremities:LE ankle NO edema, distal peripheral pulses palpable.   Skin:No rashes,no icterus. AOZ:HYQMVH muscle bulk,tone, power   Medications reviewed:  Scheduled Meds:  sodium chloride   Intravenous Once   Chlorhexidine Gluconate Cloth  6 each Topical Daily   cloNIDine  0.4 mg Transdermal Weekly   [START ON 04/20/2021] cloNIDine  0.1 mg Oral BID   Followed by   Derrill Memo ON 04/21/2021] cloNIDine  0.1 mg Oral Daily   cloNIDine  0.2 mg Oral BID   diltiazem  60 mg Oral Q8H   feeding supplement  237 mL Oral BID BM   [START ON 04/20/2021] glipiZIDE  2.5 mg Oral QAC supper   glipiZIDE  5 mg Oral QAC breakfast   hydrALAZINE  50 mg Oral Q8H   insulin aspart  0-5 Units Subcutaneous QHS   insulin aspart  0-9 Units Subcutaneous TID WC   insulin aspart  7 Units Subcutaneous TID WC   ipratropium  0.5 mg Nebulization BID   isosorbide mononitrate  30 mg Oral Daily   levalbuterol  1.25 mg Nebulization BID   [START ON 04/22/2021] pantoprazole  40 mg Intravenous Q12H   Continuous Infusions:  cefTRIAXone (ROCEPHIN)  IV Stopped (04/19/21 1043)   pantoprazole     pantoprazole      Pressure Injury 04/18/21 Sacrum Stage 2 -  Partial thickness loss of dermis presenting as a shallow open injury with a red, pink wound bed without slough. (Active)  04/18/21 2000  Location: Sacrum  Location Orientation:   Staging: Stage 2 -  Partial thickness loss of dermis presenting as a shallow open injury with a red, pink wound bed without slough.  Wound Description (Comments):   Present on Admission:    Diet Order             Diet regular Room service appropriate? Yes; Fluid consistency: Thin  Diet effective now                     Intake/Output Summary (Last 24 hours) at 04/19/2021 1123 Last data filed at 04/19/2021 0600 Gross per 24 hour  Intake 206.08 ml  Output 681 ml  Net -474.92 ml   Net IO Since Admission: -564.78 mL [04/19/21 1123]  Wt Readings from Last 3 Encounters:  04/19/21 70.1 kg  04/02/21 72.6 kg  03/14/21 75.1 kg     Unresulted Labs (From  admission, onward)     Start     Ordered   04/19/21 1110  Type and screen [place],   STAT       Comments: [place]    04/19/21 1113   04/19/21 1110  Prepare RBC (crossmatch)  (Adult Blood Administration - Red Blood Cells)  Once,   R       Question Answer Comment  # of Units 2 units   Transfusion Indications Symptomatic Anemia   Transfusion Indications Actively Bleeding / GI Bleed   Number of Units to Keep Ahead NO units ahead   If emergent release call blood bank Not emergent release      04/19/21 1113   04/19/21 8469  Basic metabolic panel  Daily,   R     Question:  Specimen collection method  Answer:  Lab=Lab collect   04/18/21 0801   04/19/21 0500  PTH, intact and calcium  Tomorrow morning,   R       Question:  Specimen collection method  Answer:  Lab=Lab collect   04/18/21 1334   04/19/21 0500  VITAMIN D 25 Hydroxy (Vit-D Deficiency, Fractures)  Tomorrow morning,   R       Question:  Specimen collection method  Answer:  Lab=Lab collect   04/18/21 1334   04/18/21 0500  Calcium, ionized  Tomorrow morning,   R        04/17/21 2036          Data Reviewed: I have personally reviewed following labs and imaging studies CBC: Recent Labs  Lab 04/17/21 1643 04/17/21 1704 04/18/21 0251 04/19/21 1024  WBC 18.6*  --  12.6* 23.1*  NEUTROABS 14.0*  --  11.1*  --   HGB 9.3* 10.2* 8.2* 5.7*  HCT 29.5* 30.0* 26.0* 17.5*  MCV 99.7  --  98.9 96.7  PLT 314  --  235 638   Basic Metabolic Panel: Recent Labs  Lab 04/17/21 1643 04/17/21 1704 04/18/21 0251 04/19/21 0243  NA 130* 129* 130* 134*  K 4.5 4.6 4.4 4.3  CL 87* 90* 89* 93*  CO2 32  --  29 33*  GLUCOSE 412* 412* 422* 350*  BUN 81* 85* 77* 83*  CREATININE 2.47* 2.50* 2.31* 2.14*  CALCIUM 11.5*  --  11.1* 11.3*  MG 2.2  --  2.4  --   PHOS  --   --  3.4  --    GFR: Estimated Creatinine Clearance: 14.3 mL/min (A) (by C-G formula based on SCr of 2.14 mg/dL  (H)). Liver Function Tests: Recent Labs  Lab 04/17/21 1643 04/18/21 0251  AST 14* 14*  ALT 12 11  ALKPHOS 79 70  BILITOT 0.5 0.3  PROT 7.3 6.6  ALBUMIN 3.6 3.0*   No results for input(s): LIPASE, AMYLASE in the last 168 hours. No results for input(s): AMMONIA in the last 168 hours. Coagulation Profile: No results for input(s): INR, PROTIME in the last 168 hours. Cardiac Enzymes: No results for input(s): CKTOTAL, CKMB, CKMBINDEX, TROPONINI in the last 168 hours. BNP (last 3 results) No results for input(s): PROBNP in the last 8760 hours. HbA1C: No results for input(s): HGBA1C in the last 72 hours. CBG: Recent Labs  Lab 04/18/21 1142 04/18/21 1641 04/18/21 2127 04/18/21 2226 04/19/21 0820  GLUCAP 316* 376* 340* 340* 335*   Lipid Profile: No results for input(s): CHOL, HDL, LDLCALC, TRIG, CHOLHDL, LDLDIRECT in the last 72 hours. Thyroid Function Tests: No results for input(s): TSH, T4TOTAL, FREET4, T3FREE, THYROIDAB in the last 72 hours. Anemia Panel: No results for input(s): VITAMINB12, FOLATE, FERRITIN, TIBC, IRON, RETICCTPCT in the last 72 hours. Sepsis Labs: Recent Labs  Lab 04/17/21 2000  PROCALCITON 0.73    Recent Results (from the past 240 hour(s))  Resp Panel by RT-PCR (Flu A&B, Covid) Nasopharyngeal Swab     Status: None   Collection Time: 04/17/21  8:00 PM   Specimen: Nasopharyngeal Swab; Nasopharyngeal(NP) swabs in vial transport medium  Result Value Ref Range Status   SARS Coronavirus 2 by RT PCR NEGATIVE NEGATIVE Final    Comment: (NOTE) SARS-CoV-2 target nucleic acids are NOT DETECTED.  The SARS-CoV-2 RNA is generally detectable in upper respiratory specimens during the acute phase of infection. The lowest concentration of SARS-CoV-2 viral copies this assay can detect is 138 copies/mL. A negative result does not preclude SARS-Cov-2  infection and should not be used as the sole basis for treatment or other patient management decisions. A negative  result may occur with  improper specimen collection/handling, submission of specimen other than nasopharyngeal swab, presence of viral mutation(s) within the areas targeted by this assay, and inadequate number of viral copies(<138 copies/mL). A negative result must be combined with clinical observations, patient history, and epidemiological information. The expected result is Negative.  Fact Sheet for Patients:  EntrepreneurPulse.com.au  Fact Sheet for Healthcare Providers:  IncredibleEmployment.be  This test is no t yet approved or cleared by the [place] FDA and  has been authorized for detection and/or diagnosis of SARS-CoV-2 by FDA under an Emergency Use Authorization (EUA). This EUA will remain  in effect (meaning this test can be used) for the duration of the COVID-19 declaration under Section 564(b)(1) of the Act, 21 U.S.C.section 360bbb-3(b)(1), unless the authorization is terminated  or revoked sooner.       Influenza A by PCR NEGATIVE NEGATIVE Final   Influenza B by PCR NEGATIVE NEGATIVE Final    Comment: (NOTE) The Xpert Xpress SARS-CoV-2/FLU/RSV plus assay is intended as an aid in the diagnosis of influenza from Nasopharyngeal swab specimens and should not be used as a sole basis for treatment. Nasal washings and aspirates are unacceptable for Xpert Xpress SARS-CoV-2/FLU/RSV testing.  Fact Sheet for Patients: EntrepreneurPulse.com.au  Fact Sheet for Healthcare Providers: IncredibleEmployment.be  This test is not yet approved or cleared by the [place] FDA and has been authorized for detection and/or diagnosis of SARS-CoV-2 by FDA under an Emergency Use Authorization (EUA). This EUA will remain in effect (meaning this test can be used) for the duration of the COVID-19 declaration under Section 564(b)(1) of the Act, 21 U.S.C. section 360bbb-3(b)(1), unless the authorization is  terminated or revoked.  Performed at [facility], [place] [address], [place], [place] [address]   MRSA Next Gen by PCR, Nasal     Status: None   Collection Time: 04/17/21 10:33 PM   Specimen: Nasal Mucosa; Nasal Swab  Result Value Ref Range Status   MRSA by PCR Next Gen NOT DETECTED NOT DETECTED Final    Comment: (NOTE) The GeneXpert MRSA Assay (FDA approved for NASAL specimens only), is one component of a comprehensive MRSA colonization surveillance program. It is not intended to diagnose MRSA infection nor to guide or monitor treatment for MRSA infections. Test performance is not FDA approved in patients less than 39 years old. Performed at [facility], [place] [address] [place], [place] [address]     Antimicrobials: Anti-infectives (From admission, onward)    Start     Dose/Rate Route Frequency Ordered Stop   04/18/21 1000  cefTRIAXone (ROCEPHIN) 1 g in sodium chloride 0.9 % 100 mL IVPB        1 g 200 mL/hr over 30 Minutes Intravenous Every 24 hours 04/18/21 0801 04/23/21 0959      Culture/Microbiology    Component Value Date/Time   SDES  03/29/2021 1959    BLOOD LEFT WRIST Performed at [facility], [place] [address], [place], [place] [address]    [place]  03/29/2021 1959    BOTTLES DRAWN AEROBIC AND ANAEROBIC Blood Culture adequate volume Performed at [facility] [address], [place], [place] [address]    CULT  03/29/2021 1959    NO GROWTH 5 DAYS Performed at [facility] [address], [place], [place] [address]    REPTSTATUS 04/03/2021  FINAL 03/29/2021 1959    Radiology Studies: [facility] Chest Port 1 View  Result Date: 04/19/2021 CLINICAL DATA:  Increased shortness of breath EXAM: PORTABLE CHEST 1 VIEW COMPARISON:  Chest radiograph 04/17/2021 FINDINGS: The cardiomediastinal silhouette is stable. There is dense mitral annular calcification. A loop recorder is again seen. Aeration  of the lungs is not significantly changed. There is no new or worsening focal airspace disease. There is no pleural effusion or pneumothorax. The bones are stable.  Right axillary surgical clips are again seen. IMPRESSION: Stable chest since 04/17/2021 with no new or worsening focal airspace disease. Electronically Signed   By: Valetta Mole M.D.   On: 04/19/2021 07:53   DG Chest Port 1 View  Result Date: 04/17/2021 CLINICAL DATA:  Short of breath.  Respiratory distress EXAM: PORTABLE CHEST 1 VIEW COMPARISON:  03/29/2021 FINDINGS: Heart size and vascularity normal. Improved aeration in the right lung base which is now clear. Left lower lobe remains clear. Heart size normal. Cardiac loop recorder noted. Surgical clips right axilla. IMPRESSION: Interval improvement in right lower lobe airspace disease and right effusion. Lungs now clear. Electronically Signed   By: Franchot Gallo M.D.   On: 04/17/2021 17:34     LOS: 2 days   Antonieta Pert, MD [facility]  04/19/2021, 11:23 AM

## 2021-04-19 NOTE — Progress Notes (Addendum)
Received Pt from ICU, alert to self. Made Pt comfortable in bed, had a big maroon colored bowel movement. Noted skin tear at the sacral area. Pt is on comfort care. Care giver at bedside.

## 2021-04-19 NOTE — Progress Notes (Signed)
Engineer, maintenance [facility]) Hospital Liaison note.   Received request from [place] for family interest in [facility]. [facility] is unable to offer a room today. Hospital Liaison will follow up tomorrow or sooner if a room becomes available. Patient chart has been reviewed and is eligible.   Please do not hesitate to call with questions.   Thank you,  Clementeen Hoof, BSN, RN  [facility] Liaison  [phone]

## 2021-04-19 NOTE — Assessment & Plan Note (Addendum)
On Dilaudid and Ativan.  On comfort care

## 2021-04-20 DIAGNOSIS — Z515 Encounter for palliative care: Secondary | ICD-10-CM

## 2021-04-20 LAB — BASIC METABOLIC PANEL
Anion gap: 12 (ref 5–15)
BUN: 110 mg/dL — ABNORMAL HIGH (ref 8–23)
CO2: 29 mmol/L (ref 22–32)
Calcium: 11.3 mg/dL — ABNORMAL HIGH (ref 8.9–10.3)
Chloride: 94 mmol/L — ABNORMAL LOW (ref 98–111)
Creatinine, Ser: 2.42 mg/dL — ABNORMAL HIGH (ref 0.44–1.00)
GFR, Estimated: 18 mL/min — ABNORMAL LOW (ref >60)
Glucose, Bld: 233 mg/dL — ABNORMAL HIGH (ref 70–99)
Potassium: 4.9 mmol/L (ref 3.5–5.1)
Sodium: 135 mmol/L (ref 135–145)

## 2021-04-20 LAB — CALCIUM, IONIZED: Calcium, Ionized, Serum: 6.3 mg/dL — ABNORMAL HIGH (ref 4.5–5.6)

## 2021-04-20 LAB — PTH, INTACT AND CALCIUM
Calcium, Total (PTH): 11.2 mg/dL — ABNORMAL HIGH (ref 8.7–10.3)
PTH: 94 pg/mL — ABNORMAL HIGH (ref 15–65)

## 2021-04-20 MED ORDER — HYDROMORPHONE HCL 1 MG/ML IJ SOLN
0.5000 mg | Freq: Four times a day (QID) | INTRAMUSCULAR | Status: DC
  Administered 2021-04-20 – 2021-04-21 (×4): 0.5 mg via INTRAVENOUS
  Filled 2021-04-20 (×4): qty 0.5

## 2021-04-20 MED ORDER — LORAZEPAM 2 MG/ML IJ SOLN: 0.5000 mg | INTRAMUSCULAR | Status: DC | PRN

## 2021-04-20 NOTE — Progress Notes (Signed)
° °  Plans for hospice care noted.  We will sign off.

## 2021-04-20 NOTE — Plan of Care (Signed)
°  Problem: Education: Goal: Knowledge of General Education information will improve Description: Including pain rating scale, medication(s)/side effects and non-pharmacologic comfort measures Outcome: Progressing   Problem: Safety: Goal: Ability to remain free from injury will improve Outcome: Progressing   Problem: Skin Integrity: Goal: Risk for impaired skin integrity will decrease Outcome: Progressing   Problem: Elimination: Goal: Will not experience complications related to bowel motility Outcome: Progressing

## 2021-04-20 NOTE — Consult Note (Signed)
WOC Nurse Consult Note: Patient receiving care in [place]. Reason for Consult: wound sacrum I personally evaluated the patient's sacral area yesterday, entered a thorough note, and put care orders in the computer. Please refer to to pieces of information, as this consult was completed on 04/19/21. [place] nurse will not follow at this time.  Please re-consult the [place] team if needed.  Val Riles, RN, MSN, CWOCN, CNS-BC, pager [phone]

## 2021-04-20 NOTE — Progress Notes (Signed)
Chaplain engaged in a visit with Sabrina [name]'s long time care-giver who has been with her for 5 years.  Chaplain provided support and inquired after Sabrina [name]'s family and was told they have the support they need from their community.  [place], Bcc Pager, [phone] 12:54 PM

## 2021-04-20 NOTE — Progress Notes (Signed)
[facility]) hospitalized hospice patient visit.   This is a current recently enrolled (04/15/2021) hospice patient with a terminal diagnosis of Cerebrovascular disease.  Family activated EMS when patient became extremely short of breath. Patient was admitted to hospital on 04/17/21 with a diagnosis of Acute exacerbation of chronic obstructive pulmonary disease. Per Dr. Karie Georges with [facility] this is a related admission.  Visited at bedside. Caregiver present. Patient does not appear in distress. Left VM message for niece.   This patient is appropriate for inpatient due to need for symptom management and EOL care.   VS: 97.7, 151/89, 123, 17, 78% RA I/O: 172.9/500  Abnormal labs: 04/20/21 03:24 Chloride: 94 (L) Glucose: 233 (H) BUN: 110 (H) Creatinine: 2.42 (H) Calcium: 11.3 (H) GFR, Estimated: 18 (L)  IV/PRN Medications: Rocephin 1g IVPB QD, Protonix 8mg /hr continuous, Scheduled Dilaudid 0.5mg  IV Q6hrs,  Dilaudid 0.5mg  IV PRN @ 0105, 0252, 0548, [id number]  Problem list: Assessment and Plan: Acute exacerbation of chronic obstructive pulmonary disease Acute respiratory failure with hypoxia (HCC)- (present on admission) Acute GI bleeding Acute blood loss anemia in the setting of anemia of chronic disease Acute metabolic encephalopathy with lethargy multifactorial  PAF W/ rvr- in NSR now Hypertensive urgency-needing clonidine and multiple agents Elevated troponin likely demand ischemia in the setting of A-fib RVR hypoxia. Leucocytosis Chronic diastolic congestive heart failure  Acute renal failure superimposed on stage 4 chronic kidney disease-creat at 2s Hypercalcemia Dehydration Obesity, Class I, BMI 30-34.9 Hyponatremia T2DM stage 4 chronic kidney disease Aortic stenosis End-of-life care/comfort care   After extensive discussion with palliative care with POA, as per patient wishes she has been on home hospice recently enrolled now with her complex  acute hospitalization due to hypoxia COPD exacerbation acute GI bleeding acute blood loss anemia acute renal failure and multiple comorbidities as above poor prognosis, transition to comfort measures, awaiting for [facility]  Discharge planning: [place] referral, waiting on bed availability  Family Contact: spoke to caregiver at bedside and left VM for [name] IDG:  Updated Goals of care:  DNR. Clear. Full Comfort Care as of today   Should patient need ambulance transport at discharge please use [facility] as [facility] contracts this service with them for our active hospice patients.  Please do not hesitate to call with questions.   Thank you,   Farrel Gordon, RN, [place] [facility] Liaison   [phone]

## 2021-04-20 NOTE — Progress Notes (Signed)
[facility] Hospitalist PROGRESS NOTE  Sabrina [name]  AVW:[id number] DOB: [birth year] DOA: 04/17/2021 PCP: [name], MD   Brief Narrative/Hospital Course: Sabrina [name], 88 y.o. female  63 F w / h/o dementia, stage IV chronic kidney disease with baseline creatinine 2-2.5, hypertension, type 2 diabetes mellitus, chronic diastolic heart failure, PAF on Eliquis, COPD, moderate aortic stenosis, chronic anemia with baseline hemoglobin 8-9 presented to ED 2/14 with shortness of breath with 1 day history of progressive worsening shortness of breath, new onset wheezing.  Recently hospitalized in January for right lower lobe pneumonia.  Patient W/ poor historrian w/ dementia. In the ED-lab showed leukocytosis BNP 257 stable high-sensitivity troponin 39> 95 previously 169, EKG with A-fib RVR, chest x-ray resolution of right lower lobe airspace disease-heart rate in 140s in aVR given Cardizem bolus and placed on drip IV fluids bronchodilators and admitted to stepdown for A-fib RVR, COPD exacerbation, acute hypoxic respiratory failure, mild dehydration, AKI hypercalcemia. Further lab with Pro-Cal 0.7 COVID-19 negative.  Cardiology was consulted patient was managed for A-fib with RVR uncontrolled hypertension acute hypoxic spitty failure COPD exacerbation and uncontrolled hyperglycemia. 2/16-patient had maroon-colored large bowel movement with sudden drop in hemoglobin suspecting acute upper GI bleeding and acute blood loss anemia initially discussed about transfusion however further palliative care meeting and meeting with the hospice nurse niece POA opted for comfort measures.  Patient placed on comfort measures order set and referred to [facility]. We will continue with current end-of-life care while she is waiting for inpatient hospice.  Subjective: Seen this morning patient anxious morning.  Had a bowel movement early in the morning with blood/maroon  Assessment and Plan: Acute exacerbation of  chronic obstructive pulmonary disease Acute respiratory failure with hypoxia (HCC)- (present on admission) Acute GI bleeding Acute blood loss anemia in the setting of anemia of chronic disease Acute metabolic encephalopathy with lethargy multifactorial  PAF W/ rvr- in NSR now Hypertensive urgency-needing clonidine and multiple agents Elevated troponin likely demand ischemia in the setting of A-fib RVR hypoxia. Leucocytosis Chronic diastolic congestive heart failure  Acute renal failure superimposed on stage 4 chronic kidney disease-creat at 2s Hypercalcemia Dehydration Obesity, Class I, BMI 30-34.9 Hyponatremia T2DM stage 4 chronic kidney disease Aortic stenosis End-of-life care/comfort care  After extensive discussion with palliative care with POA, as per patient wishes she has been on home hospice recently enrolled now with her complex acute hospitalization due to hypoxia COPD exacerbation acute GI bleeding acute blood loss anemia acute renal failure and multiple comorbidities as above poor prognosis, transition to comfort measures, awaiting for [facility]  DVT prophylaxis: SCDs Start: 04/17/21 1933 Code Status:   Code Status: DNR Family Communication: plan of care discussed with patient/caregiver aid at bedside. And spoke w/ Niece 2/16.  Palliative care following/communicating with family Disposition: Waiting for hospice   Objective: Vitals last 24 hrs: Vitals:   04/19/21 1600 04/19/21 1700 04/19/21 1800 04/19/21 2103  BP:    (!) 151/89  Pulse: (!) 112 (!) 110 (!) 113 (!) 123  Resp: (!) 28 18 13 17   Temp:    97.7 F (36.5 C)  TempSrc:    Oral  SpO2: (!) 88% (!) 86% (!) 85% (!) 78%  Weight:      Height:       Weight change:   Physical Examination: General exam: Lethargic morning in distress  HEENT:Oral mucosa moist, Ear/Nose WNL grossly, dentition normal. Respiratory system: bilaterally diminished, no use of accessory muscle Cardiovascular system: S1 & S2 +,  No  JVD,. Gastrointestinal system: Abdomen distended  Nervous System:Alert, awake, moving extremities and grossly nonfocal Extremities: LE ankle edema none distal peripheral pulses palpable.  Skin: No rashes,no icterus. MSK: Normal muscle bulk,tone, power   Medications reviewed:  Scheduled Meds:  sodium chloride   Intravenous Once   Chlorhexidine Gluconate Cloth  6 each Topical Daily   cloNIDine  0.4 mg Transdermal Weekly   cloNIDine  0.1 mg Oral BID   Followed by   Derrill Memo ON 04/21/2021] cloNIDine  0.1 mg Oral Daily   diltiazem  60 mg Oral Q8H   feeding supplement  237 mL Oral BID BM   hydrALAZINE  50 mg Oral Q8H    HYDROmorphone (DILAUDID) injection  0.5 mg Intravenous Q6H   ipratropium  0.5 mg Nebulization BID   isosorbide mononitrate  30 mg Oral Daily   levalbuterol  1.25 mg Nebulization BID   [START ON 04/22/2021] pantoprazole  40 mg Intravenous Q12H   Continuous Infusions:  cefTRIAXone (ROCEPHIN)  IV 1 g (04/20/21 1042)   pantoprazole 8 mg/hr (04/19/21 2141)    Pressure Injury 04/18/21 Sacrum Stage 2 -  Partial thickness loss of dermis presenting as a shallow open injury with a red, pink wound bed without slough. (Active)  04/18/21 2000  Location: Sacrum  Location Orientation:   Staging: Stage 2 -  Partial thickness loss of dermis presenting as a shallow open injury with a red, pink wound bed without slough.  Wound Description (Comments):   Present on Admission:    Diet Order             Diet regular Room service appropriate? Yes; Fluid consistency: Thin  Diet effective now                    Intake/Output Summary (Last 24 hours) at 04/20/2021 1151 Last data filed at 04/20/2021 0600 Gross per 24 hour  Intake 172.91 ml  Output 500 ml  Net -327.09 ml    Net IO Since Admission: -891.87 mL [04/20/21 1151]  Wt Readings from Last 3 Encounters:  04/19/21 70.1 kg  04/02/21 72.6 kg  03/14/21 75.1 kg     Unresulted Labs (From admission, onward)    None     Data  Reviewed: I have personally reviewed following labs and imaging studies CBC: Recent Labs  Lab 04/17/21 1643 04/17/21 1704 04/18/21 0251 04/19/21 1024  WBC 18.6*  --  12.6* 23.1*  NEUTROABS 14.0*  --  11.1*  --   HGB 9.3* 10.2* 8.2* 5.7*  HCT 29.5* 30.0* 26.0* 17.5*  MCV 99.7  --  98.9 96.7  PLT 314  --  235 604    Basic Metabolic Panel: Recent Labs  Lab 04/17/21 1643 04/17/21 1704 04/18/21 0251 04/19/21 0243 04/20/21 0324  NA 130* 129* 130* 134* 135  K 4.5 4.6 4.4 4.3 4.9  CL 87* 90* 89* 93* 94*  CO2 32  --  29 33* 29  GLUCOSE 412* 412* 422* 350* 233*  BUN 81* 85* 77* 83* 110*  CREATININE 2.47* 2.50* 2.31* 2.14* 2.42*  CALCIUM 11.5*  --  11.1* 11.3*   11.2* 11.3*  MG 2.2  --  2.4  --   --   PHOS  --   --  3.4  --   --     GFR: Estimated Creatinine Clearance: 12.6 mL/min (A) (by C-G formula based on SCr of 2.42 mg/dL (H)). Liver Function Tests: Recent Labs  Lab 04/17/21 1643 04/18/21 0251  AST 14*  14*  ALT 12 11  ALKPHOS 79 70  BILITOT 0.5 0.3  PROT 7.3 6.6  ALBUMIN 3.6 3.0*    No results for input(s): LIPASE, AMYLASE in the last 168 hours. No results for input(s): AMMONIA in the last 168 hours. Coagulation Profile: No results for input(s): INR, PROTIME in the last 168 hours. Cardiac Enzymes: No results for input(s): CKTOTAL, CKMB, CKMBINDEX, TROPONINI in the last 168 hours. BNP (last 3 results) No results for input(s): PROBNP in the last 8760 hours. HbA1C: No results for input(s): HGBA1C in the last 72 hours. CBG: Recent Labs  Lab 04/18/21 1641 04/18/21 2127 04/18/21 2226 04/19/21 0820 04/19/21 1136  GLUCAP 376* 340* 340* 335* 254*    Lipid Profile: No results for input(s): CHOL, HDL, LDLCALC, TRIG, CHOLHDL, LDLDIRECT in the last 72 hours. Thyroid Function Tests: No results for input(s): TSH, T4TOTAL, FREET4, T3FREE, THYROIDAB in the last 72 hours. Anemia Panel: No results for input(s): VITAMINB12, FOLATE, FERRITIN, TIBC, IRON, RETICCTPCT  in the last 72 hours. Sepsis Labs: Recent Labs  Lab 04/17/21 2000  PROCALCITON 0.73     Recent Results (from the past 240 hour(s))  Resp Panel by RT-PCR (Flu A&B, Covid) Nasopharyngeal Swab     Status: None   Collection Time: 04/17/21  8:00 PM   Specimen: Nasopharyngeal Swab; Nasopharyngeal(NP) swabs in vial transport medium  Result Value Ref Range Status   SARS Coronavirus 2 by RT PCR NEGATIVE NEGATIVE Final    Comment: (NOTE) SARS-CoV-2 target nucleic acids are NOT DETECTED.  The SARS-CoV-2 RNA is generally detectable in upper respiratory specimens during the acute phase of infection. The lowest concentration of SARS-CoV-2 viral copies this assay can detect is 138 copies/mL. A negative result does not preclude SARS-Cov-2 infection and should not be used as the sole basis for treatment or other patient management decisions. A negative result may occur with  improper specimen collection/handling, submission of specimen other than nasopharyngeal swab, presence of viral mutation(s) within the areas targeted by this assay, and inadequate number of viral copies(<138 copies/mL). A negative result must be combined with clinical observations, patient history, and epidemiological information. The expected result is Negative.  Fact Sheet for Patients:  EntrepreneurPulse.com.au  Fact Sheet for Healthcare Providers:  IncredibleEmployment.be  This test is no t yet approved or cleared by the [place] FDA and  has been authorized for detection and/or diagnosis of SARS-CoV-2 by FDA under an Emergency Use Authorization (EUA). This EUA will remain  in effect (meaning this test can be used) for the duration of the COVID-19 declaration under Section 564(b)(1) of the Act, 21 U.S.C.section 360bbb-3(b)(1), unless the authorization is terminated  or revoked sooner.       Influenza A by PCR NEGATIVE NEGATIVE Final   Influenza B by PCR NEGATIVE NEGATIVE  Final    Comment: (NOTE) The Xpert Xpress SARS-CoV-2/FLU/RSV plus assay is intended as an aid in the diagnosis of influenza from Nasopharyngeal swab specimens and should not be used as a sole basis for treatment. Nasal washings and aspirates are unacceptable for Xpert Xpress SARS-CoV-2/FLU/RSV testing.  Fact Sheet for Patients: EntrepreneurPulse.com.au  Fact Sheet for Healthcare Providers: IncredibleEmployment.be  This test is not yet approved or cleared by the [place] FDA and has been authorized for detection and/or diagnosis of SARS-CoV-2 by FDA under an Emergency Use Authorization (EUA). This EUA will remain in effect (meaning this test can be used) for the duration of the COVID-19 declaration under Section 564(b)(1) of the Act, 21 U.S.C. section  360bbb-3(b)(1), unless the authorization is terminated or revoked.  Performed at [facility], [place] [address] [place], [place] [address]   MRSA Next Gen by PCR, Nasal     Status: None   Collection Time: 04/17/21 10:33 PM   Specimen: Nasal Mucosa; Nasal Swab  Result Value Ref Range Status   MRSA by PCR Next Gen NOT DETECTED NOT DETECTED Final    Comment: (NOTE) The GeneXpert MRSA Assay (FDA approved for NASAL specimens only), is one component of a comprehensive MRSA colonization surveillance program. It is not intended to diagnose MRSA infection nor to guide or monitor treatment for MRSA infections. Test performance is not FDA approved in patients less than 57 years old. Performed at [facility], [place] [address], [place], [place] [address]      Antimicrobials: Anti-infectives (From admission, onward)    Start     Dose/Rate Route Frequency Ordered Stop   04/18/21 1000  cefTRIAXone (ROCEPHIN) 1 g in sodium chloride 0.9 % 100 mL IVPB        1 g 200 mL/hr over 30 Minutes Intravenous Every 24 hours 04/18/21 0801 04/23/21 0959       Culture/Microbiology    Component Value Date/Time   SDES  03/29/2021 1959    BLOOD LEFT WRIST Performed at [facility], [place] [address], [place], [place] [address]    [place]  03/29/2021 1959    BOTTLES DRAWN AEROBIC AND ANAEROBIC Blood Culture adequate volume Performed at [place] [address] [place], [place] [address]    CULT  03/29/2021 1959    NO GROWTH 5 DAYS Performed at [place] [address], [place], [place] [address]    REPTSTATUS 04/03/2021 FINAL 03/29/2021 1959    Radiology Studies: [facility] Chest Port 1 View  Result Date: 04/19/2021 CLINICAL DATA:  Increased shortness of breath EXAM: PORTABLE CHEST 1 VIEW COMPARISON:  Chest radiograph 04/17/2021 FINDINGS: The cardiomediastinal silhouette is stable. There is dense mitral annular calcification. A loop recorder is again seen. Aeration of the lungs is not significantly changed. There is no new or worsening focal airspace disease. There is no pleural effusion or pneumothorax. The bones are stable.  Right axillary surgical clips are again seen. IMPRESSION: Stable chest since 04/17/2021 with no new or worsening focal airspace disease. Electronically Signed   By: Valetta Mole M.D.   On: 04/19/2021 07:53     LOS: 3 days   Antonieta Pert, MD [facility]  04/20/2021, 11:51 AM

## 2021-04-20 NOTE — Progress Notes (Addendum)
Daily Progress Note   Patient Name: Sabrina [name]       Date: 04/20/2021 DOB: 1926-03-15  Age: 88 y.o. MRN#: [id number] Attending Physician: Antonieta Pert, MD Primary Care Physician: [name], MD Admit Date: 04/17/2021  Reason for Consultation/Follow-up: Terminal Care  Subjective:  Resting in bed, moans at times, appears restless at times, caregiver at bedside.   Length of Stay: 3  Current Medications: Scheduled Meds:   sodium chloride   Intravenous Once   Chlorhexidine Gluconate Cloth  6 each Topical Daily   cloNIDine  0.4 mg Transdermal Weekly   cloNIDine  0.1 mg Oral BID   Followed by   Derrill Memo ON 04/21/2021] cloNIDine  0.1 mg Oral Daily   diltiazem  60 mg Oral Q8H   feeding supplement  237 mL Oral BID BM   hydrALAZINE  50 mg Oral Q8H    HYDROmorphone (DILAUDID) injection  0.5 mg Intravenous Q6H   ipratropium  0.5 mg Nebulization BID   isosorbide mononitrate  30 mg Oral Daily   levalbuterol  1.25 mg Nebulization BID   [START ON 04/22/2021] pantoprazole  40 mg Intravenous Q12H    Continuous Infusions:  cefTRIAXone (ROCEPHIN)  IV 1 g (04/20/21 1042)   pantoprazole 8 mg/hr (04/19/21 2141)    PRN Meds: acetaminophen **OR** acetaminophen, bisacodyl, diltiazem, hydrALAZINE, hydrALAZINE, HYDROmorphone (DILAUDID) injection, levalbuterol  Physical Exam         Lethargic appearing elderly lady Mild distress and restless at times.  Diminished breath sounds S 1 S 2  No edema Abdomen is distended  Vital Signs: BP (!) 151/89 (BP Location: Left Arm)    Pulse (!) 123    Temp 97.7 F (36.5 C) (Oral)    Resp 17    Ht 4\' 11"  (1.499 m)    Wt 70.1 kg    SpO2 (!) 78%    BMI 31.21 kg/m  SpO2: SpO2: (!) 78 % O2 Device: O2 Device: Nasal Cannula O2 Flow Rate: O2 Flow Rate (L/min): 2  L/min  Intake/output summary:  Intake/Output Summary (Last 24 hours) at 04/20/2021 1133 Last data filed at 04/20/2021 0600 Gross per 24 hour  Intake 172.91 ml  Output 500 ml  Net -327.09 ml   LBM: Last BM Date : 04/19/21 Baseline Weight: Weight: 72.6 kg Most recent weight: Weight: 70.1 kg  Palliative Assessment/Data:      Patient Active Problem List   Diagnosis Date Noted   Lethargy 04/19/2021   Acute GI bleeding 04/19/2021   Pressure ulcer, sacrum 04/19/2021   Anemia due to chronic kidney disease 04/18/2021   Hyponatremia 04/18/2021   Obesity, Class I, BMI 30-34.9 04/18/2021   Acute exacerbation of chronic obstructive pulmonary disease (COPD) ([place]) 04/17/2021   Paroxysmal atrial fibrillation with RVR (HCC) 04/17/2021   Dehydration 04/17/2021   Leucocytosis 04/17/2021   Goals of care, counseling/discussion 04/13/2021   Pneumonia 03/30/2021   Acute and chronic respiratory failure with hypoxia ([place]) 03/29/2021   Hypertensive urgency    Demand ischemia (HCC)    COPD (chronic obstructive pulmonary disease) ([place]) 03/10/2021   Atrial fibrillation with RVR ([place]) 03/10/2021   Normocytic anemia 02/08/2021   Symptomatic anemia 02/07/2021   Nonspecific abnormal electrocardiogram (ECG) (EKG)    Dyspnea 12/18/2020   Abnormal gait 05/18/2020   Body mass index (BMI) 35.0-35.9, adult 05/18/2020   Cardiac pacemaker in situ 05/18/2020   Diabetic nephropathy ([place]) 05/18/2020   IgM monoclonal gammopathy of uncertain significance 05/18/2020   Malignant hypertensive chronic kidney disease 05/18/2020   Morbid obesity ([place]) 05/18/2020   Thrombophilia ([place]) 05/18/2020   Secondary hypercoagulable state ([place]) 08/16/2019   Osteoporosis 05/04/2019   Hypercalcemia 11/04/2018   History of stroke 11/04/2018   Paroxysmal atrial fibrillation ([place]) 11/04/2018   Acute kidney injury ([place])    Acute metabolic encephalopathy 30/16/0109   Personal history of DVT and pulm embolus 10/26/2018    Personal history of other venous thrombosis and embolism 10/26/2018   UTI (urinary tract infection) 10/24/2018   Clavicle fracture 09/26/2018   Degeneration of lumbar intervertebral disc 10/01/2017   Blurry vision, bilateral    Acute blood loss anemia    History of breast cancer 05/30/2017   Physical deconditioning 05/30/2017   Occipital infarction ([place]) 05/30/2017   Pressure injury of skin 05/28/2017   Fall at home 05/27/2017   Acute renal failure superimposed on stage 4 chronic kidney disease ([place]) 05/27/2017   High cholesterol 05/27/2017   Type 2 diabetes mellitus with stage 4 chronic kidney disease ([place]) 05/27/2017   Hypertension associated with diabetes ([place]) 05/27/2017   Gout 05/27/2017   Elevated troponin 05/27/2017   Lumbar radiculopathy 05/15/2017   Pain of right calf 04/30/2017   Asymmetrical left sensorineural hearing loss 02/14/2016   Impacted cerumen of left ear 02/14/2016   Lipoma of lower extremity 09/12/2014   Abnormal x-ray 03/15/2014   Chronic diastolic congestive heart failure ([place]) 02/23/2014   Candidiasis of skin 01/13/2014   Osteopenia 11/30/2013   Hypoglycemia 09/13/2013   Acute respiratory failure with hypoxia ([place]) 06/14/2013   History of pulmonary embolism 06/10/2013   Nausea and vomiting 06/10/2013   Accelerated hypertension 06/10/2013   Candidiasis of vagina 05/19/2013   Aortic stenosis 04/22/2013   Edema leg 04/22/2013   Breast cancer of upper-outer quadrant of right female breast ([place]); in remission 03/19/2013    Palliative Care Assessment & Plan   Patient Profile:    Assessment:  80 F w / h/o dementia, stage IV chronic kidney disease with baseline creatinine 2-2.5, hypertension, type 2 diabetes mellitus, chronic diastolic heart failure, PAF on Eliquis, COPD, moderate aortic stenosis, chronic anemia with baseline hemoglobin 8-9 presented to ED 2/14 with shortness of breath with 1 day history of progressive worsening shortness of breath, new  onset wheezing.  Recently hospitalized in January for right lower lobe pneumonia.   Hospital course complicated by GI  bleed, low Hgb, further decline in mental status, family elected for comfort measures and now awaiting residential hospice.   Recommendations/Plan: Continue current supportive comfort measures and transfer to residential hospice when bed available, hospice liaisons following closely.    Goals of Care and Additional Recommendations: Limitations on Scope of Treatment: Full Comfort Care  Code Status:    Code Status Orders  (From admission, onward)           Start     Ordered   04/17/21 1933  Do not attempt resuscitation (DNR)  Continuous       Question Answer Comment  In the event of cardiac or respiratory ARREST Do not call a code blue   In the event of cardiac or respiratory ARREST Do not perform Intubation, CPR, defibrillation or ACLS   In the event of cardiac or respiratory ARREST Use medication by any route, position, wound care, and other measures to relive pain and suffering. May use oxygen, suction and manual treatment of airway obstruction as needed for comfort.   Comments DNR confirmed w/ nephew      04/17/21 1932           Code Status History     Date Active Date Inactive Code Status Order ID Comments User Context   04/17/2021 1914 04/17/2021 1932 DNR [id number]  Drenda Freeze, MD ED   03/30/2021 0133 04/02/2021 1806 DNR [id number]  Vianne Bulls, MD ED   03/29/2021 2153 03/30/2021 0133 DNR [id number]  Fredia Sorrow, MD ED   03/10/2021 0906 03/16/2021 0409 DNR [id number]  Jonnie Finner, DO Inpatient   02/07/2021 1924 02/13/2021 1658 DNR [id number]  Lenore Cordia, MD ED   02/07/2021 1249 02/07/2021 1924 DNR [id number]  Fredia Sorrow, MD ED   12/19/2020 1508 12/22/2020 1915 DNR [id number]  Kathie Dike, MD Inpatient   12/18/2020 1632 12/19/2020 1508 Full Code [id number]  Jonnie Finner, DO Inpatient   11/05/2018 0007 11/06/2018 2235 DNR [id number]   Lenore Cordia, MD ED   10/24/2018 1445 10/28/2018 2017 Full Code [id number]  Terrilee Croak, MD ED   09/26/2018 1733 09/29/2018 2318 Full Code [id number]  Barb Merino, MD Inpatient   06/25/2017 0333 06/27/2017 1736 Full Code [id number]  Vianne Bulls, MD ED   05/30/2017 1638 06/04/2017 1826 Full Code [id number]  Rondel Jumbo, PA-C ED   05/27/2017 1210 05/28/2017 2114 Full Code [id number]  Patrecia Pour, MD ED   09/13/2013 1055 09/14/2013 1745 Full Code [id number]  Reyne Dumas, MD ED   06/10/2013 2100 06/19/2013 1940 Full Code [id number]  Mendel Corning, MD Inpatient   04/15/2013 2004 04/16/2013 1156 Full Code [id number]  Rolm Bookbinder, MD Inpatient   03/30/2013 2104 03/31/2013 2039 Full Code [id number]  Rolm Bookbinder, MD Inpatient      Advance Directive Documentation    Flowsheet Row Most Recent Value  Type of Advance Directive Healthcare Power of Attorney, Out of facility DNR (pink MOST or yellow form)  Pre-existing out of facility DNR order (yellow form or pink MOST form) --  "MOST" Form in Place? --       Prognosis:  < 2 weeks  Discharge Planning: Hospice facility  Care plan was discussed with caregiver at bedside, IDT.   Thank you for allowing the Palliative Medicine Team to assist in the care of this patient.   Time In: 10 Time Out: 10.25 Total Time 25 Prolonged Time Billed  no       Greater than 50%  of  this time was spent counseling and coordinating care related to the above assessment and plan.  Loistine Chance, MD  Please contact Palliative Medicine Team phone at [phone] for questions and concerns.   04-20-21: Call placed and updated niece healthcare power of attorney [name].  Discussed about end-of-life signs and symptoms.  No hospice bed availability today.  Discussed about current comfort medications.  Will add IV Ativan as needed.  Discussed with her about scheduled IV Dilaudid as well as as needed.  PMT to continue to follow.

## 2021-04-21 MED ORDER — HYDROMORPHONE HCL 1 MG/ML IJ SOLN
1.0000 mg | INTRAMUSCULAR | Status: DC
Start: 2021-04-21 — End: 2021-04-23
  Administered 2021-04-21 – 2021-04-23 (×14): 1 mg via INTRAVENOUS
  Filled 2021-04-21 (×14): qty 1

## 2021-04-21 NOTE — Plan of Care (Signed)
°  Problem: Clinical Measurements: Goal: Respiratory complications will improve Outcome: Progressing   Problem: Pain Managment: Goal: General experience of comfort will improve Outcome: Progressing   Problem: Elimination: Goal: Will not experience complications related to bowel motility Outcome: Progressing

## 2021-04-21 NOTE — Progress Notes (Signed)
[facility]) Hospitalized Hospice Patient    This is a current, recently enrolled (04/15/2021) hospice patient, with a terminal diagnosis of cerebrovascular disease.  Family activated EMS when patient became extremely short of breath. Patient was admitted to hospital on 04/17/21 with a diagnosis of acute exacerbation of chronic obstructive pulmonary disease. Per Dr. Karie Georges with [facility] this is a related admission.  Visited patient at the bedside. Personal caregiver present in the room. Patient is not alert and does not respond to voice. Per caregiver, she was agitated earlier today and required additional analgesics to help calm her down. Respirations are shallow but regular.  V/S: 101.5 axillary, 183/80, HR 137, RR 18, SPO2 96% on [place] @ 2 lpm I&O: 127/1100 Labs: none today Diagnostics: none today IV/PRNs: scheduled dilaudid 1 mg q4h; PRN dilaudid 0.5 mg IV x 2 within the last 12 hours; apresoline 10 mg IV x 1 for elevated blood pressure  Problem List: - COPD exacerbation - currently on 2 lpm via [place], respirations are regular, SPO2 96% - end of life - decision made to shift to full comfort, waiting for a bed a [employer]; comfort meds in place and scheduled  Discharge planning: [place] once a bed is available Family Contact: spoke to caregiver at bedside and left VM for [name] IDG:  Updated Goals of care:  full comfort measures in place  Should patient need ambulance transport at discharge please use [facility] as [facility] contracts this service with them for our active hospice patients.    Venia Carbon BSN, RN [facility] Liaison

## 2021-04-21 NOTE — Progress Notes (Signed)
[facility] Hospitalist PROGRESS NOTE  Sabrina [name]  CZY:[id number] DOB: 10-06-1926 DOA: 04/17/2021 PCP: [name], MD   Brief Narrative/Hospital Course: 40 F w / h/o dementia, stage IV chronic kidney disease with baseline creatinine 2-2.5, hypertension, type 2 diabetes mellitus, chronic diastolic heart failure, PAF on Eliquis, COPD, moderate aortic stenosis, chronic anemia with baseline hemoglobin 8-9 presented to ED 2/14 with shortness of breath with 1 day history of progressive worsening shortness of breath, new onset wheezing.  Recently hospitalized in January for right lower lobe pneumonia.  Patient W/ poor historrian w/ dementia. In the ED-lab showed leukocytosis BNP 257 stable high-sensitivity troponin 39> 95 previously 169, EKG with A-fib RVR, chest x-ray resolution of right lower lobe airspace disease-heart rate in 140s in aVR given Cardizem bolus and placed on drip IV fluids bronchodilators and admitted to stepdown for A-fib RVR, COPD exacerbation, acute hypoxic respiratory failure, mild dehydration, AKI hypercalcemia. Further lab with Pro-Cal 0.7 COVID-19 negative.  Cardiology was consulted patient was managed for A-fib with RVR uncontrolled hypertension acute hypoxic spitty failure COPD exacerbation and uncontrolled hyperglycemia. 2/16-patient had maroon-colored large bowel movement with sudden drop in hemoglobin suspecting acute upper GI bleeding and acute blood loss anemia initially discussed about transfusion however further palliative care meeting and meeting with the hospice nurse niece POA opted for comfort measures.  Patient placed on comfort measures order set and referred to [facility]. We will continue with current end-of-life care while she is waiting for inpatient hospice.  Subjective:  Nursing reports no recurrent bleeding.  Patient is restless this morning  trying to remove [place] o2. How many pulmonary pulmonary  Assessment and Plan: Acute exacerbation of chronic  obstructive pulmonary disease Acute respiratory failure with hypoxia- (present on admission) Acute GI bleeding Acute blood loss anemia in the setting of anemia of chronic disease Acute metabolic encephalopathy with lethargy multifactorial  PAF W/ rvr- in NSR now Hypertensive urgency-needing clonidine and multiple agents Elevated troponin likely demand ischemia in the setting of A-fib RVR hypoxia. Leucocytosis Chronic diastolic congestive heart failure  Acute renal failure superimposed on stage 4 chronic kidney disease-creat at 2s Hypercalcemia Dehydration Obesity, Class I, BMI 30-34.9 Hyponatremia T2DM stage 4 chronic kidney disease Aortic stenosis End-of-life care/comfort care Plan: Discussed with palliative care who updated patient's niece POA this morning, adjusting Dilaudid as patient was restless.  Continue with end-of-life comfort measures pending bead at [facility]  DVT prophylaxis: SCDs Start: 04/17/21 1933 Code Status:   Code Status: DNR Family Communication: plan of care discussed by palliative care with niece again today.  Previously spoken patient's family.   Disposition: Waiting for hospice   Objective: Vitals last 24 hrs: Vitals:   04/19/21 2103 04/20/21 2032 04/20/21 2124 04/21/21 0500  BP: (!) 151/89  (!) 183/80   Pulse: (!) 123  (!) 137   Resp: 17  18   Temp: 97.7 F (36.5 C)  (!) 101.5 F (38.6 C)   TempSrc: Oral  Axillary   SpO2: (!) 78% 96% 96%   Weight:    67.8 kg  Height:       Weight change:   Physical Examination: General exam: Restless, lethargic  HEENT:Oral mucosa moist, Ear/Nose WNL grossly, dentition normal. Respiratory system: bilaterally diminished, no use of accessory muscle Cardiovascular system: S1 & S2 +, No JVD,. Gastrointestinal system: Abdomen soft, distended  Nervous System: Restless  Extremities: LE ankle edema none, distal peripheral pulses palpable.  Skin: No rashes,no icterus. MSK: Normal muscle bulk,tone, power    Medications  reviewed:  Scheduled Meds:  Chlorhexidine Gluconate Cloth  6 each Topical Daily   cloNIDine  0.4 mg Transdermal Weekly    HYDROmorphone (DILAUDID) injection  1 mg Intravenous Q4H   Continuous Infusions:    Pressure Injury 04/18/21 Sacrum Stage 2 -  Partial thickness loss of dermis presenting as a shallow open injury with a red, pink wound bed without slough. (Active)  04/18/21 2000  Location: Sacrum  Location Orientation:   Staging: Stage 2 -  Partial thickness loss of dermis presenting as a shallow open injury with a red, pink wound bed without slough.  Wound Description (Comments):   Present on Admission:    Diet Order             Diet regular Room service appropriate? Yes; Fluid consistency: Thin  Diet effective now                    Intake/Output Summary (Last 24 hours) at 04/21/2021 1001 Last data filed at 04/21/2021 0600 Gross per 24 hour  Intake 127.34 ml  Output 1100 ml  Net -972.66 ml    Net IO Since Admission: -1,864.53 mL [04/21/21 1001]  Wt Readings from Last 3 Encounters:  04/21/21 67.8 kg  04/02/21 72.6 kg  03/14/21 75.1 kg     Unresulted Labs (From admission, onward)    None     Data Reviewed: I have personally reviewed following labs and imaging studies CBC: Recent Labs  Lab 04/17/21 1643 04/17/21 1704 04/18/21 0251 04/19/21 1024  WBC 18.6*  --  12.6* 23.1*  NEUTROABS 14.0*  --  11.1*  --   HGB 9.3* 10.2* 8.2* 5.7*  HCT 29.5* 30.0* 26.0* 17.5*  MCV 99.7  --  98.9 96.7  PLT 314  --  235 062    Basic Metabolic Panel: Recent Labs  Lab 04/17/21 1643 04/17/21 1704 04/18/21 0251 04/19/21 0243 04/20/21 0324  NA 130* 129* 130* 134* 135  K 4.5 4.6 4.4 4.3 4.9  CL 87* 90* 89* 93* 94*  CO2 32  --  29 33* 29  GLUCOSE 412* 412* 422* 350* 233*  BUN 81* 85* 77* 83* 110*  CREATININE 2.47* 2.50* 2.31* 2.14* 2.42*  CALCIUM 11.5*  --  11.1* 11.3*   11.2* 11.3*  MG 2.2  --  2.4  --   --   PHOS  --   --  3.4  --   --      GFR: Estimated Creatinine Clearance: 12.4 mL/min (A) (by C-G formula based on SCr of 2.42 mg/dL (H)). Liver Function Tests: Recent Labs  Lab 04/17/21 1643 04/18/21 0251  AST 14* 14*  ALT 12 11  ALKPHOS 79 70  BILITOT 0.5 0.3  PROT 7.3 6.6  ALBUMIN 3.6 3.0*    No results for input(s): LIPASE, AMYLASE in the last 168 hours. No results for input(s): AMMONIA in the last 168 hours. Coagulation Profile: No results for input(s): INR, PROTIME in the last 168 hours. Cardiac Enzymes: No results for input(s): CKTOTAL, CKMB, CKMBINDEX, TROPONINI in the last 168 hours. BNP (last 3 results) No results for input(s): PROBNP in the last 8760 hours. HbA1C: No results for input(s): HGBA1C in the last 72 hours. CBG: Recent Labs  Lab 04/18/21 1641 04/18/21 2127 04/18/21 2226 04/19/21 0820 04/19/21 1136  GLUCAP 376* 340* 340* 335* 254*    Lipid Profile: No results for input(s): CHOL, HDL, LDLCALC, TRIG, CHOLHDL, LDLDIRECT in the last 72 hours. Thyroid Function Tests: No results for input(s):  TSH, T4TOTAL, FREET4, T3FREE, THYROIDAB in the last 72 hours. Anemia Panel: No results for input(s): VITAMINB12, FOLATE, FERRITIN, TIBC, IRON, RETICCTPCT in the last 72 hours. Sepsis Labs: Recent Labs  Lab 04/17/21 2000  PROCALCITON 0.73     Recent Results (from the past 240 hour(s))  Resp Panel by RT-PCR (Flu A&B, Covid) Nasopharyngeal Swab     Status: None   Collection Time: 04/17/21  8:00 PM   Specimen: Nasopharyngeal Swab; Nasopharyngeal(NP) swabs in vial transport medium  Result Value Ref Range Status   SARS Coronavirus 2 by RT PCR NEGATIVE NEGATIVE Final    Comment: (NOTE) SARS-CoV-2 target nucleic acids are NOT DETECTED.  The SARS-CoV-2 RNA is generally detectable in upper respiratory specimens during the acute phase of infection. The lowest concentration of SARS-CoV-2 viral copies this assay can detect is 138 copies/mL. A negative result does not preclude  SARS-Cov-2 infection and should not be used as the sole basis for treatment or other patient management decisions. A negative result may occur with  improper specimen collection/handling, submission of specimen other than nasopharyngeal swab, presence of viral mutation(s) within the areas targeted by this assay, and inadequate number of viral copies(<138 copies/mL). A negative result must be combined with clinical observations, patient history, and epidemiological information. The expected result is Negative.  Fact Sheet for Patients:  EntrepreneurPulse.com.au  Fact Sheet for Healthcare Providers:  IncredibleEmployment.be  This test is no t yet approved or cleared by the [place] FDA and  has been authorized for detection and/or diagnosis of SARS-CoV-2 by FDA under an Emergency Use Authorization (EUA). This EUA will remain  in effect (meaning this test can be used) for the duration of the COVID-19 declaration under Section 564(b)(1) of the Act, 21 U.S.C.section 360bbb-3(b)(1), unless the authorization is terminated  or revoked sooner.       Influenza A by PCR NEGATIVE NEGATIVE Final   Influenza B by PCR NEGATIVE NEGATIVE Final    Comment: (NOTE) The Xpert Xpress SARS-CoV-2/FLU/RSV plus assay is intended as an aid in the diagnosis of influenza from Nasopharyngeal swab specimens and should not be used as a sole basis for treatment. Nasal washings and aspirates are unacceptable for Xpert Xpress SARS-CoV-2/FLU/RSV testing.  Fact Sheet for Patients: EntrepreneurPulse.com.au  Fact Sheet for Healthcare Providers: IncredibleEmployment.be  This test is not yet approved or cleared by the [place] FDA and has been authorized for detection and/or diagnosis of SARS-CoV-2 by FDA under an Emergency Use Authorization (EUA). This EUA will remain in effect (meaning this test can be used) for the duration of  the COVID-19 declaration under Section 564(b)(1) of the Act, 21 U.S.C. section 360bbb-3(b)(1), unless the authorization is terminated or revoked.  Performed at [facility], [place] [address] [place], [place] [address]   MRSA Next Gen by PCR, Nasal     Status: None   Collection Time: 04/17/21 10:33 PM   Specimen: Nasal Mucosa; Nasal Swab  Result Value Ref Range Status   MRSA by PCR Next Gen NOT DETECTED NOT DETECTED Final    Comment: (NOTE) The GeneXpert MRSA Assay (FDA approved for NASAL specimens only), is one component of a comprehensive MRSA colonization surveillance program. It is not intended to diagnose MRSA infection nor to guide or monitor treatment for MRSA infections. Test performance is not FDA approved in patients less than 22 years old. Performed at [facility], [place] [address] [place], [place] [address]      Antimicrobials: Anti-infectives (From admission, onward)  Start     Dose/Rate Route Frequency Ordered Stop   04/18/21 1000  cefTRIAXone (ROCEPHIN) 1 g in sodium chloride 0.9 % 100 mL IVPB  Status:  Discontinued        1 g 200 mL/hr over 30 Minutes Intravenous Every 24 hours 04/18/21 0801 04/20/21 1323      Culture/Microbiology    Component Value Date/Time   SDES  03/29/2021 1959    BLOOD LEFT WRIST Performed at [facility], [place] [address], [place], [place] [address]    [place]  03/29/2021 1959    BOTTLES DRAWN AEROBIC AND ANAEROBIC Blood Culture adequate volume Performed at [place] [address], [place], [place] [address]    CULT  03/29/2021 1959    NO GROWTH 5 DAYS Performed at [place] [address], [place], [place] [address]    REPTSTATUS 04/03/2021 FINAL 03/29/2021 1959    Radiology Studies: No results found.   LOS: 4 days   Antonieta Pert, MD [facility]  04/21/2021, 10:01 AM

## 2021-04-21 NOTE — Progress Notes (Signed)
Daily Progress Note   Patient Name: Sabrina [name]       Date: 04/21/2021 DOB: [birth year]  Age: 88 y.o. MRN#: [id number] Attending Physician: Antonieta Pert, MD Primary Care Physician: [name], MD Admit Date: 04/17/2021  Reason for Consultation/Follow-up: Terminal Care  Subjective:  Resting in bed, moans at times, appears restless at times, caregiver at bedside. Patient attempting to remove her O2 .  Chart reviewed, opioid needs noted call placed and discussed with niece [name].   Length of Stay: 4  Current Medications: Scheduled Meds:   Chlorhexidine Gluconate Cloth  6 each Topical Daily   cloNIDine  0.4 mg Transdermal Weekly    HYDROmorphone (DILAUDID) injection  0.5 mg Intravenous Q6H    Continuous Infusions:    PRN Meds: acetaminophen **OR** acetaminophen, bisacodyl, diltiazem, hydrALAZINE, hydrALAZINE, HYDROmorphone (DILAUDID) injection, levalbuterol, LORazepam  Physical Exam         Lethargic appearing elderly lady Mild distress and restless at times.  Diminished breath sounds S 1 S 2  No edema Abdomen is distended  Vital Signs: BP (!) 183/80 (BP Location: Right Arm)    Pulse (!) 137    Temp (!) 101.5 F (38.6 C) (Axillary)    Resp 18    Ht 4\' 11"  (1.499 m)    Wt 67.8 kg    SpO2 96%    BMI 30.19 kg/m  SpO2: SpO2: 96 % O2 Device: O2 Device: Nasal Cannula O2 Flow Rate: O2 Flow Rate (L/min): 2 L/min  Intake/output summary:  Intake/Output Summary (Last 24 hours) at 04/21/2021 0912 Last data filed at 04/21/2021 0600 Gross per 24 hour  Intake 127.34 ml  Output 1100 ml  Net -972.66 ml    LBM: Last BM Date : 04/20/21 Baseline Weight: Weight: 72.6 kg Most recent weight: Weight: 67.8 kg       Palliative Assessment/Data:      Patient Active Problem List    Diagnosis Date Noted   Lethargy 04/19/2021   Acute GI bleeding 04/19/2021   Pressure ulcer, sacrum 04/19/2021   Anemia due to chronic kidney disease 04/18/2021   Hyponatremia 04/18/2021   Obesity, Class I, BMI 30-34.9 04/18/2021   Acute exacerbation of chronic obstructive pulmonary disease (COPD) ([place]) 04/17/2021   Paroxysmal atrial fibrillation with RVR (HCC) 04/17/2021   Dehydration 04/17/2021   Leucocytosis 04/17/2021  Goals of care, counseling/discussion 04/13/2021   Pneumonia 03/30/2021   Acute and chronic respiratory failure with hypoxia ([place]) 03/29/2021   Hypertensive urgency    Demand ischemia ([place])    COPD (chronic obstructive pulmonary disease) ([place]) 03/10/2021   Atrial fibrillation with RVR ([place]) 03/10/2021   Normocytic anemia 02/08/2021   Symptomatic anemia 02/07/2021   Nonspecific abnormal electrocardiogram (ECG) (EKG)    Dyspnea 12/18/2020   Abnormal gait 05/18/2020   Body mass index (BMI) 35.0-35.9, adult 05/18/2020   Cardiac pacemaker in situ 05/18/2020   Diabetic nephropathy (HCC) 05/18/2020   IgM monoclonal gammopathy of uncertain significance 05/18/2020   Malignant hypertensive chronic kidney disease 05/18/2020   Morbid obesity ([place]) 05/18/2020   Thrombophilia ([place]) 05/18/2020   Secondary hypercoagulable state ([place]) 08/16/2019   Osteoporosis 05/04/2019   Hypercalcemia 11/04/2018   History of stroke 11/04/2018   Paroxysmal atrial fibrillation ([place]) 11/04/2018   Acute kidney injury ([place])    Acute metabolic encephalopathy 25/95/6387   Personal history of DVT and pulm embolus 10/26/2018   Personal history of other venous thrombosis and embolism 10/26/2018   UTI (urinary tract infection) 10/24/2018   Clavicle fracture 09/26/2018   Degeneration of lumbar intervertebral disc 10/01/2017   Blurry vision, bilateral    Acute blood loss anemia    History of breast cancer 05/30/2017   Physical deconditioning 05/30/2017   Occipital infarction ([place]) 05/30/2017    Pressure injury of skin 05/28/2017   Fall at home 05/27/2017   Acute renal failure superimposed on stage 4 chronic kidney disease ([place]) 05/27/2017   High cholesterol 05/27/2017   Type 2 diabetes mellitus with stage 4 chronic kidney disease ([place]) 05/27/2017   Hypertension associated with diabetes ([place]) 05/27/2017   Gout 05/27/2017   Elevated troponin 05/27/2017   Lumbar radiculopathy 05/15/2017   Pain of right calf 04/30/2017   Asymmetrical left sensorineural hearing loss 02/14/2016   Impacted cerumen of left ear 02/14/2016   Lipoma of lower extremity 09/12/2014   Abnormal x-ray 03/15/2014   Chronic diastolic congestive heart failure ([place]) 02/23/2014   Candidiasis of skin 01/13/2014   Osteopenia 11/30/2013   Hypoglycemia 09/13/2013   Acute respiratory failure with hypoxia ([place]) 06/14/2013   History of pulmonary embolism 06/10/2013   Nausea and vomiting 06/10/2013   Accelerated hypertension 06/10/2013   Candidiasis of vagina 05/19/2013   Aortic stenosis 04/22/2013   Edema leg 04/22/2013   Breast cancer of upper-outer quadrant of right female breast ([place]); in remission 03/19/2013    Palliative Care Assessment & Plan   Patient Profile:    Assessment:  34 F w / h/o dementia, stage IV chronic kidney disease with baseline creatinine 2-2.5, hypertension, type 2 diabetes mellitus, chronic diastolic heart failure, PAF on Eliquis, COPD, moderate aortic stenosis, chronic anemia with baseline hemoglobin 8-9 presented to ED 2/14 with shortness of breath with 1 day history of progressive worsening shortness of breath, new onset wheezing.  Recently hospitalized in January for right lower lobe pneumonia.   Hospital course complicated by GI bleed, low Hgb, further decline in mental status, family elected for comfort measures and now awaiting residential hospice.   Recommendations/Plan: Continue current supportive comfort measures and transfer to residential hospice when bed available, hospice  liaisons following closely.   Increase scheduled IV Dilaudid to 1 mg, continue PRN IV Dilaudid and PRN IV Ativan and monitor.  Ok to d.c supplemental O2.   Goals of Care and Additional Recommendations: Limitations on Scope of Treatment: Full Comfort Care  Code Status:  Code Status Orders  (From admission, onward)           Start     Ordered   04/17/21 1933  Do not attempt resuscitation (DNR)  Continuous       Question Answer Comment  In the event of cardiac or respiratory ARREST Do not call a code blue   In the event of cardiac or respiratory ARREST Do not perform Intubation, CPR, defibrillation or ACLS   In the event of cardiac or respiratory ARREST Use medication by any route, position, wound care, and other measures to relive pain and suffering. May use oxygen, suction and manual treatment of airway obstruction as needed for comfort.   Comments DNR confirmed w/ nephew      04/17/21 1932           Code Status History     Date Active Date Inactive Code Status Order ID Comments User Context   04/17/2021 1914 04/17/2021 1932 DNR [id number]  Drenda Freeze, MD ED   03/30/2021 0133 04/02/2021 1806 DNR [id number]  Vianne Bulls, MD ED   03/29/2021 2153 03/30/2021 0133 DNR [id number]  Fredia Sorrow, MD ED   03/10/2021 0906 03/16/2021 0409 DNR [id number]  Jonnie Finner, DO Inpatient   02/07/2021 1924 02/13/2021 1658 DNR [id number]  Lenore Cordia, MD ED   02/07/2021 1249 02/07/2021 1924 DNR [id number]  Fredia Sorrow, MD ED   12/19/2020 1508 12/22/2020 1915 DNR [id number]  Kathie Dike, MD Inpatient   12/18/2020 1632 12/19/2020 1508 Full Code [id number]  Jonnie Finner, DO Inpatient   11/05/2018 0007 11/06/2018 2235 DNR [id number]  Lenore Cordia, MD ED   10/24/2018 1445 10/28/2018 2017 Full Code [id number]  Terrilee Croak, MD ED   09/26/2018 1733 09/29/2018 2318 Full Code [id number]  Barb Merino, MD Inpatient   06/25/2017 0333 06/27/2017 1736 Full Code [id number]  Vianne Bulls, MD ED    05/30/2017 1638 06/04/2017 1826 Full Code [id number]  Rondel Jumbo, PA-C ED   05/27/2017 1210 05/28/2017 2114 Full Code [id number]  Patrecia Pour, MD ED   09/13/2013 1055 09/14/2013 1745 Full Code [id number]  Reyne Dumas, MD ED   06/10/2013 2100 06/19/2013 1940 Full Code [id number]  Mendel Corning, MD Inpatient   04/15/2013 2004 04/16/2013 1156 Full Code [id number]  Rolm Bookbinder, MD Inpatient   03/30/2013 2104 03/31/2013 2039 Full Code [id number]  Rolm Bookbinder, MD Inpatient      Advance Directive Documentation    Flowsheet Row Most Recent Value  Type of Advance Directive Healthcare Power of Attorney, Out of facility DNR (pink MOST or yellow form)  Pre-existing out of facility DNR order (yellow form or pink MOST form) --  "MOST" Form in Place? --       Prognosis:  < 2 weeks  Discharge Planning: Hospice facility  Care plan was discussed with caregiver at [place] on phone IDT.   Thank you for allowing the Palliative Medicine Team to assist in the care of this patient.   Time In: 8.30 Time Out: 9.05 Total Time 35 Prolonged Time Billed  no       Greater than 50%  of this time was spent counseling and coordinating care related to the above assessment and plan.  Loistine Chance, MD  Please contact Palliative Medicine Team phone at [phone] for questions and concerns.

## 2021-04-22 DIAGNOSIS — E1122 Type 2 diabetes mellitus with diabetic chronic kidney disease: Secondary | ICD-10-CM

## 2021-04-22 DIAGNOSIS — R5383 Other fatigue: Secondary | ICD-10-CM

## 2021-04-22 DIAGNOSIS — J9601 Acute respiratory failure with hypoxia: Secondary | ICD-10-CM

## 2021-04-22 DIAGNOSIS — D631 Anemia in chronic kidney disease: Secondary | ICD-10-CM

## 2021-04-22 DIAGNOSIS — D62 Acute posthemorrhagic anemia: Secondary | ICD-10-CM

## 2021-04-22 DIAGNOSIS — K922 Gastrointestinal hemorrhage, unspecified: Secondary | ICD-10-CM

## 2021-04-22 DIAGNOSIS — E669 Obesity, unspecified: Secondary | ICD-10-CM

## 2021-04-22 DIAGNOSIS — N179 Acute kidney failure, unspecified: Secondary | ICD-10-CM

## 2021-04-22 DIAGNOSIS — R0602 Shortness of breath: Secondary | ICD-10-CM

## 2021-04-22 DIAGNOSIS — E871 Hypo-osmolality and hyponatremia: Secondary | ICD-10-CM

## 2021-04-22 DIAGNOSIS — N189 Chronic kidney disease, unspecified: Secondary | ICD-10-CM

## 2021-04-22 NOTE — Progress Notes (Signed)
[facility]) Hospitalized Hospice Patient    This is a current, recently enrolled (04/15/2021) hospice patient, with a terminal diagnosis of cerebrovascular disease.  Family activated EMS when patient became extremely short of breath. Patient was admitted to hospital on 04/17/21 with a diagnosis of acute exacerbation of chronic obstructive pulmonary disease. Per Dr. Karie Georges with [facility] this is a related admission.   Visited patient at the bedside. Personal caregiver present in the room. Patient eyes are open but she is not interactive.    V/S: 99.8 axillary, 154/40, HR 129, RR 14, SPO2 92% on [place] @ 2 lpm I&O: 0/1000 Labs: none today Diagnostics: none today IV/PRNs: scheduled dilaudid 1 mg q4h   Problem List: - COPD exacerbation - currently on 2 lpm via [place], respirations are regular, SPO2 96% - end of life - decision made to shift to full comfort, waiting for a bed a [employer]; comfort meds in place and scheduled   Discharge planning: [place] once a bed is available Family Contact: spoke to caregiver at bedside and left VM for [name] IDG:  Updated Goals of care:  full comfort measures in place   Should patient need ambulance transport at discharge please use [facility] as [facility] contracts this service with them for our active hospice patients.     Venia Carbon BSN, RN [facility] Liaison

## 2021-04-22 NOTE — Plan of Care (Signed)
  Problem: Pain Managment: Goal: General experience of comfort will improve Outcome: Progressing   Problem: Safety: Goal: Ability to remain free from injury will improve Outcome: Progressing   

## 2021-04-22 NOTE — Progress Notes (Signed)
Daily Progress Note   Patient Name: Sabrina [name]       Date: 04/22/2021 DOB: 1926/08/20  Age: 88 y.o. MRN#: [id number] Attending Physician: Flora Lipps, MD Primary Care Physician: [name], MD Admit Date: 04/17/2021  Reason for Consultation/Follow-up: Terminal Care  Subjective:  Resting in bed, appears comfortable,  had low grade fever,not restless, appears more pale and with shallow breathing,caregiver at bedside. Chart reviewed, opioid needs noted call placed and discussed with niece [name].   Length of Stay: 5  Current Medications: Scheduled Meds:   Chlorhexidine Gluconate Cloth  6 each Topical Daily   cloNIDine  0.4 mg Transdermal Weekly    HYDROmorphone (DILAUDID) injection  1 mg Intravenous Q4H    Continuous Infusions:    PRN Meds: acetaminophen **OR** acetaminophen, bisacodyl, diltiazem, hydrALAZINE, hydrALAZINE, HYDROmorphone (DILAUDID) injection, levalbuterol, LORazepam  Physical Exam         Lethargic appearing elderly lady Appears comfortable Actively dying Diminished breath sounds S 1 S 2  No edema Abdomen is distended  Vital Signs: BP (!) 153/75 (BP Location: Right Arm)    Pulse (!) 129    Temp (!) 100.7 F (38.2 C) (Oral)    Resp 20    Ht 4\' 11"  (1.499 m)    Wt 67.8 kg    SpO2 94%    BMI 30.19 kg/m  SpO2: SpO2: 94 % O2 Device: O2 Device: Nasal Cannula O2 Flow Rate: O2 Flow Rate (L/min): 2 L/min  Intake/output summary:  Intake/Output Summary (Last 24 hours) at 04/22/2021 1125 Last data filed at 04/22/2021 1000 Gross per 24 hour  Intake 0 ml  Output 1000 ml  Net -1000 ml    LBM: Last BM Date : 04/20/21 Baseline Weight: Weight: 72.6 kg Most recent weight: Weight: 67.8 kg       Palliative Assessment/Data:      Patient Active Problem  List   Diagnosis Date Noted   Lethargy 04/19/2021   Acute GI bleeding 04/19/2021   Pressure ulcer, sacrum 04/19/2021   Anemia due to chronic kidney disease 04/18/2021   Hyponatremia 04/18/2021   Obesity, Class I, BMI 30-34.9 04/18/2021   Acute exacerbation of chronic obstructive pulmonary disease (COPD) ([place]) 04/17/2021   Paroxysmal atrial fibrillation with RVR (HCC) 04/17/2021   Goals of care, counseling/discussion 04/13/2021   Pneumonia 03/30/2021  Acute and chronic respiratory failure with hypoxia ([place]) 03/29/2021   Hypertensive urgency    Demand ischemia (HCC)    COPD (chronic obstructive pulmonary disease) ([place]) 03/10/2021   Atrial fibrillation with RVR (HCC) 03/10/2021   Normocytic anemia 02/08/2021   Symptomatic anemia 02/07/2021   Nonspecific abnormal electrocardiogram (ECG) (EKG)    Dyspnea 12/18/2020   Abnormal gait 05/18/2020   Body mass index (BMI) 35.0-35.9, adult 05/18/2020   Cardiac pacemaker in situ 05/18/2020   Diabetic nephropathy (HCC) 05/18/2020   IgM monoclonal gammopathy of uncertain significance 05/18/2020   Malignant hypertensive chronic kidney disease 05/18/2020   Morbid obesity ([place]) 05/18/2020   Thrombophilia ([place]) 05/18/2020   Secondary hypercoagulable state ([place]) 08/16/2019   Osteoporosis 05/04/2019   Hypercalcemia 11/04/2018   History of stroke 11/04/2018   Paroxysmal atrial fibrillation ([place]) 11/04/2018   Acute kidney injury ([place])    Acute metabolic encephalopathy 47/42/5956   Personal history of DVT and pulm embolus 10/26/2018   Personal history of other venous thrombosis and embolism 10/26/2018   UTI (urinary tract infection) 10/24/2018   Clavicle fracture 09/26/2018   Degeneration of lumbar intervertebral disc 10/01/2017   Blurry vision, bilateral    Acute blood loss anemia    History of breast cancer 05/30/2017   Physical deconditioning 05/30/2017   Occipital infarction ([place]) 05/30/2017   Pressure injury of skin 05/28/2017   Fall at  home 05/27/2017   Acute renal failure superimposed on stage 4 chronic kidney disease ([place]) 05/27/2017   High cholesterol 05/27/2017   Type 2 diabetes mellitus with stage 4 chronic kidney disease ([place]) 05/27/2017   Hypertension associated with diabetes ([place]) 05/27/2017   Gout 05/27/2017   Elevated troponin 05/27/2017   Lumbar radiculopathy 05/15/2017   Pain of right calf 04/30/2017   Asymmetrical left sensorineural hearing loss 02/14/2016   Impacted cerumen of left ear 02/14/2016   Lipoma of lower extremity 09/12/2014   Abnormal x-ray 03/15/2014   Chronic diastolic congestive heart failure ([place]) 02/23/2014   Candidiasis of skin 01/13/2014   Osteopenia 11/30/2013   Hypoglycemia 09/13/2013   Acute respiratory failure with hypoxia ([place]) 06/14/2013   History of pulmonary embolism 06/10/2013   Nausea and vomiting 06/10/2013   Accelerated hypertension 06/10/2013   Candidiasis of vagina 05/19/2013   Aortic stenosis 04/22/2013   Edema leg 04/22/2013   Breast cancer of upper-outer quadrant of right female breast ([place]); in remission 03/19/2013    Palliative Care Assessment & Plan   Patient Profile:    Assessment:  82 F w / h/o dementia, stage IV chronic kidney disease with baseline creatinine 2-2.5, hypertension, type 2 diabetes mellitus, chronic diastolic heart failure, PAF on Eliquis, COPD, moderate aortic stenosis, chronic anemia with baseline hemoglobin 8-9 presented to ED 2/14 with shortness of breath with 1 day history of progressive worsening shortness of breath, new onset wheezing.  Recently hospitalized in January for right lower lobe pneumonia.   Hospital course complicated by GI bleed, low Hgb, further decline in mental status, family elected for comfort measures and now awaiting residential hospice.   Recommendations/Plan: Continue current supportive comfort measures and transfer to residential hospice when bed available, hospice liaisons following closely.   On scheduled IV  Dilaudid to 1 mg, continue PRN IV Dilaudid and PRN IV Ativan and monitor.  Ok to d.c supplemental O2.   Goals of Care and Additional Recommendations: Limitations on Scope of Treatment: Full Comfort Care  Code Status:    Code Status Orders  (From admission, onward)  Start     Ordered   04/17/21 1933  Do not attempt resuscitation (DNR)  Continuous       Question Answer Comment  In the event of cardiac or respiratory ARREST Do not call a code blue   In the event of cardiac or respiratory ARREST Do not perform Intubation, CPR, defibrillation or ACLS   In the event of cardiac or respiratory ARREST Use medication by any route, position, wound care, and other measures to relive pain and suffering. May use oxygen, suction and manual treatment of airway obstruction as needed for comfort.   Comments DNR confirmed w/ nephew      04/17/21 1932           Code Status History     Date Active Date Inactive Code Status Order ID Comments User Context   04/17/2021 1914 04/17/2021 1932 DNR [id number]  Drenda Freeze, MD ED   03/30/2021 0133 04/02/2021 1806 DNR [id number]  Vianne Bulls, MD ED   03/29/2021 2153 03/30/2021 0133 DNR [id number]  Fredia Sorrow, MD ED   03/10/2021 0906 03/16/2021 0409 DNR [id number]  Jonnie Finner, DO Inpatient   02/07/2021 1924 02/13/2021 1658 DNR [id number]  Lenore Cordia, MD ED   02/07/2021 1249 02/07/2021 1924 DNR [id number]  Fredia Sorrow, MD ED   12/19/2020 1508 12/22/2020 1915 DNR [id number]  Kathie Dike, MD Inpatient   12/18/2020 1632 12/19/2020 1508 Full Code [id number]  Jonnie Finner, DO Inpatient   11/05/2018 0007 11/06/2018 2235 DNR [id number]  Lenore Cordia, MD ED   10/24/2018 1445 10/28/2018 2017 Full Code [id number]  Terrilee Croak, MD ED   09/26/2018 1733 09/29/2018 2318 Full Code [id number]  Barb Merino, MD Inpatient   06/25/2017 0333 06/27/2017 1736 Full Code [id number]  Vianne Bulls, MD ED   05/30/2017 1638 06/04/2017 1826 Full Code [id number]   Rondel Jumbo, PA-C ED   05/27/2017 1210 05/28/2017 2114 Full Code [id number]  Patrecia Pour, MD ED   09/13/2013 1055 09/14/2013 1745 Full Code [id number]  Reyne Dumas, MD ED   06/10/2013 2100 06/19/2013 1940 Full Code [id number]  Mendel Corning, MD Inpatient   04/15/2013 2004 04/16/2013 1156 Full Code [id number]  Rolm Bookbinder, MD Inpatient   03/30/2013 2104 03/31/2013 2039 Full Code [id number]  Rolm Bookbinder, MD Inpatient      Advance Directive Documentation    Flowsheet Row Most Recent Value  Type of Advance Directive Healthcare Power of Attorney, Out of facility DNR (pink MOST or yellow form)  Pre-existing out of facility DNR order (yellow form or pink MOST form) --  "MOST" Form in Place? --       Prognosis:  < 2 weeks  Discharge Planning: Hospice facility  Care plan was discussed with caregiver at [facility] on phone IDT.   Thank you for allowing the Palliative Medicine Team to assist in the care of this patient.   Time In: 8.30 Time Out: 9.05 Total Time 35 Prolonged Time Billed  no       Greater than 50%  of this time was spent counseling and coordinating care related to the above assessment and plan.  Loistine Chance, MD  Please contact Palliative Medicine Team phone at [phone] for questions and concerns.

## 2021-04-22 NOTE — Progress Notes (Signed)
PROGRESS NOTE    Sabrina [name]  TML:[id number] DOB: 07-16-1926 DOA: 04/17/2021 PCP: [name], MD    Brief Narrative:  88 years old female with past medical history of dementia, stage IV chronic kidney disease with baseline creatinine 2-2.5, hypertension, type 2 diabetes mellitus, chronic diastolic heart failure, PAF on Eliquis, COPD, moderate aortic stenosis, chronic anemia with baseline hemoglobin 8-9 presented to the ED 04/17/21 with shortness of breath for 1 day with new onset wheezing.  Patient was recently admitted hospital January 2023 for right lower lobe pneumonia.  In the ED patient had leukocytosis with elevated troponin and BNP.  EKG showed atrial fibrillation with RVR.  Cardizem bolus was given in the ED followed by drip and was admitted to the stepdown unit for A-fib with RVR, COPD exacerbation acute hypoxic respiratory failure mild dehydration AKI and hypercalcemia.  Cardiology was consulted.  Patient had maroon-colored large bowel movement on 04/19/2021 thought to be secondary to acute GI bleed.  Palliative care was consulted and at this time patient is awaiting for transfer to [facility] with comfort care.     Assessment and Plan: * Acute exacerbation of chronic obstructive pulmonary disease (COPD) ([place])- (present on admission)  on 2 L nasal cannula .COVID-19 and influenza PCR negative.  Procalcitonin elevated, continue Xopenex nebulizer.  Acute GI bleeding Significant drop in hemoglobin with large maroon bowel movement 04/19/21.  Was on Eliquis as outpatient.  None currently.  Pressure ulcer, sacrum On comfort care  Lethargy On Dilaudid and Ativan.  On comfort care  Obesity, Class I, BMI 30-34.9 On comfort care at this time  Hyponatremia Latest sodium was 135.  Anemia due to chronic kidney disease On comfort care  Paroxysmal atrial fibrillation with RVR ([place])- (present on admission) On comfort care   Goals of care, counseling/discussion Patient was  recently enrolled in hospice at the facility. Patient with active GI bleed severe anemia with multiple comorbidities. Overall prognosis is poor.  On comfort care.  Hypertensive urgency- (present on admission) Continue clonidine patch.  Hydralazine as needed  Atrial fibrillation with RVR ([place]) Admitted on Cardizem drip, transition to p.o. Cardizem, rate improving continue anticoagulation with Eliquis  Hypercalcemia- (present on admission) Latest calcium 11.3.    Acute blood loss anemia- (present on admission) Secondary to acute GI bleeding.  Currently on comfort care.  Elevated troponin- (present on admission) Likely demand ischemia in the setting of A-fib RVR hypoxia.    Type 2 diabetes mellitus with stage 4 chronic kidney disease ([place])- (present on admission) Comfort care  Acute renal failure superimposed on stage 4 chronic kidney disease ([place])- (present on admission) Latest creatinine of 2.4 on 6/81/2751  Chronic diastolic congestive heart failure ([place])- (present on admission) Lasix on hold due to AKI.  On comfort care.  Acute respiratory failure with hypoxia ([place])- (present on admission)  Currently on 2 L.  On comfort care.  Aortic stenosis On comfort care     DVT prophylaxis: SCDs Start: 04/17/21 1933   Code Status:     Code Status: DNR  Disposition: Residential hospice Status is: Inpatient Remains inpatient appropriate because: Comfort care, for residential hospice   Family Communication: Spoke with the patient's daughter at bedside.  Consultants:  Palliative care  Procedures:  None  Antimicrobials:  Rocephin 2/15>17  Anti-infectives (From admission, onward)    Start     Dose/Rate Route Frequency Ordered Stop   04/18/21 1000  cefTRIAXone (ROCEPHIN) 1 g in sodium chloride 0.9 % 100 mL IVPB  Status:  Discontinued        1 g 200 mL/hr over 30 Minutes Intravenous Every 24 hours 04/18/21 0801 04/20/21 1323        Subjective: Today, patient was seen  and examined at bedside.  Patient's daughter at bedside.  As per the daughter, patient is comfortable.  No mention of pain, nausea vomiting or shortness of breath.  Objective: Vitals:   04/20/21 2124 04/21/21 0500 04/21/21 2127 04/21/21 2155  BP: (!) 183/80  (!) 153/75   Pulse: (!) 137  (!) 129   Resp: 18  20   Temp: (!) 101.5 F (38.6 C)  (!) 100.7 F (38.2 C)   TempSrc: Axillary  Oral   SpO2: 96%  (!) 84% 94%  Weight:  67.8 kg    Height:        Intake/Output Summary (Last 24 hours) at 04/22/2021 6213 Last data filed at 04/22/2021 0500 Gross per 24 hour  Intake 0 ml  Output 1000 ml  Net -1000 ml   Filed Weights   04/17/21 2339 04/19/21 0500 04/21/21 0500  Weight: 70.6 kg 70.1 kg 67.8 kg    Physical Examination:  General:  Average built, not in obvious distress, on nasal cannula oxygen HENT:   Pallor noted oral mucosa is moist.  Chest:   Diminished breath sounds bilaterally. No crackles or wheezes.  CVS: S1 &S2 heard. No murmur.  Regular rate and rhythm. Abdomen: Soft, nontender, nondistended.  Bowel sounds are heard.  Catheter in place. Extremities: No cyanosis, clubbing or edema.  Peripheral pulses are palpable. Psych: Somnolent, normal mood CNS:  No cranial nerve deficits.  Power equal in all extremities.   Skin: Warm and dry.    Data Reviewed:   CBC: Recent Labs  Lab 04/17/21 1643 04/17/21 1704 04/18/21 0251 04/19/21 1024  WBC 18.6*  --  12.6* 23.1*  NEUTROABS 14.0*  --  11.1*  --   HGB 9.3* 10.2* 8.2* 5.7*  HCT 29.5* 30.0* 26.0* 17.5*  MCV 99.7  --  98.9 96.7  PLT 314  --  235 086    Basic Metabolic Panel: Recent Labs  Lab 04/17/21 1643 04/17/21 1704 04/18/21 0251 04/19/21 0243 04/20/21 0324  NA 130* 129* 130* 134* 135  K 4.5 4.6 4.4 4.3 4.9  CL 87* 90* 89* 93* 94*  CO2 32  --  29 33* 29  GLUCOSE 412* 412* 422* 350* 233*  BUN 81* 85* 77* 83* 110*  CREATININE 2.47* 2.50* 2.31* 2.14* 2.42*  CALCIUM 11.5*  --  11.1* 11.3*   11.2* 11.3*  MG 2.2   --  2.4  --   --   PHOS  --   --  3.4  --   --     Liver Function Tests: Recent Labs  Lab 04/17/21 1643 04/18/21 0251  AST 14* 14*  ALT 12 11  ALKPHOS 79 70  BILITOT 0.5 0.3  PROT 7.3 6.6  ALBUMIN 3.6 3.0*     Radiology Studies: No results found.    LOS: 5 days    Flora Lipps, MD [facility] 04/22/2021, 8:33 AM

## 2021-04-22 NOTE — Plan of Care (Signed)
  Problem: Clinical Measurements: Goal: Respiratory complications will improve Outcome: Progressing   Problem: Coping: Goal: Level of anxiety will decrease Outcome: Progressing   Problem: Pain Managment: Goal: General experience of comfort will improve Outcome: Progressing   

## 2021-04-22 NOTE — Assessment & Plan Note (Signed)
On comfort care 

## 2021-04-22 NOTE — TOC Progression Note (Signed)
Transition of Care [facility]) - Progression Note    Patient Details  Name: Sabrina [name] MRN: [id number] Date of Birth: 06-29-1926  Transition of Care [facility]) CM/SW Contact  Ross Ludwig, [place] Phone Number: 04/22/2021, 1:59 PM  Clinical Narrative:     CSW spoke to [place] at [facility], [facility] does not have a bed available today.  CSW to continue to follow patient's progress throughout discharge planning.     Expected Discharge Plan and [place] once bed is available.                                           Social Determinants of Health (SDOH) Interventions    Readmission Risk Interventions Readmission Risk Prevention Plan 12/22/2020 12/20/2020  Transportation Screening Complete Complete  PCP or Specialist Appt within 5-7 Days Complete Complete  Home Care Screening Complete Complete  Medication Review (RN CM) Complete Complete  Some recent data might be hidden

## 2021-04-23 DIAGNOSIS — R778 Other specified abnormalities of plasma proteins: Secondary | ICD-10-CM

## 2021-04-23 DIAGNOSIS — I160 Hypertensive urgency: Secondary | ICD-10-CM

## 2021-04-23 DIAGNOSIS — I350 Nonrheumatic aortic (valve) stenosis: Secondary | ICD-10-CM

## 2021-04-23 DIAGNOSIS — L89159 Pressure ulcer of sacral region, unspecified stage: Secondary | ICD-10-CM

## 2021-04-23 MED ORDER — LORAZEPAM 2 MG/ML IJ SOLN: 0.5000 mg | INTRAMUSCULAR | 0 refills | Status: AC | PRN

## 2021-04-23 MED ORDER — LEVALBUTEROL HCL 1.25 MG/0.5ML IN NEBU: 1.2500 mg | INHALATION_SOLUTION | RESPIRATORY_TRACT | Status: AC | PRN

## 2021-04-23 MED ORDER — CLONIDINE 0.2 MG/24HR TD PTWK: 0.4000 mg | MEDICATED_PATCH | TRANSDERMAL | Status: AC

## 2021-04-23 MED ORDER — HYDROMORPHONE HCL 1 MG/ML IJ SOLN: 1.0000 mg | INTRAMUSCULAR | 0 refills | Status: AC

## 2021-04-23 MED ORDER — BISACODYL 10 MG RE SUPP
10.0000 mg | Freq: Every day | RECTAL | 0 refills | Status: AC | PRN
Start: 2021-04-23 — End: ?

## 2021-04-23 MED ORDER — HYDROMORPHONE HCL 1 MG/ML IJ SOLN: 0.5000 mg | INTRAMUSCULAR | 0 refills | Status: AC | PRN

## 2021-04-23 MED ORDER — ACETAMINOPHEN 325 MG PO TABS: 650.0000 mg | ORAL_TABLET | Freq: Four times a day (QID) | ORAL | Status: AC | PRN

## 2021-04-23 NOTE — Progress Notes (Signed)
PROGRESS NOTE    Sabrina [name]  WUJ:[id number] DOB: 24-Mar-1926 DOA: 04/17/2021 PCP: [name], MD    Brief Narrative:  88 years old female with past medical history of dementia, stage IV chronic kidney disease with baseline creatinine 2-2.5, hypertension, type 2 diabetes mellitus, chronic diastolic heart failure, PAF on Eliquis, COPD, moderate aortic stenosis, chronic anemia with baseline hemoglobin 8-9 presented to the ED 04/17/21 with shortness of breath for 1 day with new onset wheezing.  Patient was recently admitted hospital January 2023 for right lower lobe pneumonia.  In the ED patient had leukocytosis with elevated troponin and BNP.  EKG showed atrial fibrillation with RVR.  Cardizem bolus was given in the ED followed by drip and was admitted to the stepdown unit for A-fib with RVR, COPD exacerbation acute hypoxic respiratory failure mild dehydration AKI and hypercalcemia.  Cardiology was consulted.  Patient had maroon-colored large bowel movement on 04/19/2021 thought to be secondary to acute GI bleed.  Palliative care was consulted and at this time, patient is awaiting for transfer to [facility] with comfort care.     Assessment and Plan: * Acute exacerbation of chronic obstructive pulmonary disease (COPD) ([place])- (present on admission) .COVID-19 and influenza PCR negative.  Procalcitonin elevated, continue Xopenex nebulizer.  Currently on room air  Acute GI bleeding Significant drop in hemoglobin with large maroon bowel movement on 04/19/21.  Was on Eliquis as outpatient.  No further bleeding.  No intervention planned.  Pressure ulcer, sacrum On comfort care  Lethargy On Dilaudid and Ativan.  On comfort care  Obesity, Class I, BMI 30-34.9 On comfort care at this time  Hyponatremia Latest sodium was 135.  Anemia due to chronic kidney disease On comfort care  Paroxysmal atrial fibrillation with RVR ([place])- (present on admission) On comfort care   Goals of care,  counseling/discussion Patient was recently enrolled in hospice at the facility. Patient with active GI bleed severe anemia with multiple comorbidities. Overall prognosis is poor.  On comfort care.  Hypertensive urgency- (present on admission) Continue clonidine patch.  Hydralazine as needed  Atrial fibrillation with RVR ([place]) Admitted on Cardizem drip, transition to p.o. Cardizem, rate improving continue anticoagulation with Eliquis  Hypercalcemia- (present on admission) Latest calcium 11.3.    Acute blood loss anemia- (present on admission) Secondary to acute GI bleeding.  Currently on comfort care.  No further bleeding  Elevated troponin- (present on admission) Likely demand ischemia in the setting of A-fib RVR hypoxia.    Type 2 diabetes mellitus with stage 4 chronic kidney disease ([place])- (present on admission) Comfort care  Acute renal failure superimposed on stage 4 chronic kidney disease ([place])- (present on admission) Latest creatinine of 2.4 on 04/20/2021.  No recent labs.  Chronic diastolic congestive heart failure ([place])- (present on admission) Lasix on hold due to AKI.  On comfort care.  Acute respiratory failure with hypoxia ([place])- (present on admission)  Currently on 2 L.  On comfort care.  Aortic stenosis On comfort care     DVT prophylaxis: SCDs Start: 04/17/21 1933   Code Status:     Code Status: DNR  Disposition: Residential hospice  Status is: Inpatient  Remains inpatient appropriate because: Comfort care, for residential hospice   Family Communication:  I again spoke with the patient's daughter at bedside.  Consultants:  Palliative care  Procedures:  None  Antimicrobials:  Rocephin 2/15>17  Anti-infectives (From admission, onward)    Start     Dose/Rate Route Frequency Ordered Stop  04/18/21 1000  cefTRIAXone (ROCEPHIN) 1 g in sodium chloride 0.9 % 100 mL IVPB  Status:  Discontinued        1 g 200 mL/hr over 30 Minutes Intravenous Every  24 hours 04/18/21 0801 04/20/21 1323       Subjective: Today, patient was seen and examined at bedside.  Appeared to be little more fidgety and restless today as per the patient's daughter.    Objective: Vitals:   04/21/21 2155 04/22/21 1440 04/22/21 2049 04/23/21 0546  BP:  (!) 151/40 (!) 165/66 (!) 162/62  Pulse:  (!) 129 (!) 115 98  Resp:  14 12 13   Temp:  99.8 F (37.7 C) 97.9 F (36.6 C) 98.6 F (37 C)  TempSrc:  Oral Oral Axillary  SpO2: 94% 92% 91% (!) 87%  Weight:      Height:        Intake/Output Summary (Last 24 hours) at 04/23/2021 1151 Last data filed at 04/23/2021 0551 Gross per 24 hour  Intake 0 ml  Output 120 ml  Net -120 ml   Filed Weights   04/17/21 2339 04/19/21 0500 04/21/21 0500  Weight: 70.6 kg 70.1 kg 67.8 kg    Physical Examination:  General:  Average built, not in obvious distress HENT:   Pallor noted, oral mucosa is moist.  Chest:  Diminished breath sounds bilaterally. No crackles or wheezes.  CVS: S1 &S2 heard. No murmur.  Regular rate and rhythm. Abdomen: Soft, nontender, nondistended.  Bowel sounds are heard.  Urethral catheter in place Extremities: No cyanosis, clubbing or edema.  Peripheral pulses are palpable. Psych: Appears comfortable CNS: Somnolent. Skin: Warm and dry.  No rashes noted.    Data Reviewed:   CBC: Recent Labs  Lab 04/17/21 1643 04/17/21 1704 04/18/21 0251 04/19/21 1024  WBC 18.6*  --  12.6* 23.1*  NEUTROABS 14.0*  --  11.1*  --   HGB 9.3* 10.2* 8.2* 5.7*  HCT 29.5* 30.0* 26.0* 17.5*  MCV 99.7  --  98.9 96.7  PLT 314  --  235 808    Basic Metabolic Panel: Recent Labs  Lab 04/17/21 1643 04/17/21 1704 04/18/21 0251 04/19/21 0243 04/20/21 0324  NA 130* 129* 130* 134* 135  K 4.5 4.6 4.4 4.3 4.9  CL 87* 90* 89* 93* 94*  CO2 32  --  29 33* 29  GLUCOSE 412* 412* 422* 350* 233*  BUN 81* 85* 77* 83* 110*  CREATININE 2.47* 2.50* 2.31* 2.14* 2.42*  CALCIUM 11.5*  --  11.1* 11.3*   11.2* 11.3*  MG 2.2   --  2.4  --   --   PHOS  --   --  3.4  --   --     Liver Function Tests: Recent Labs  Lab 04/17/21 1643 04/18/21 0251  AST 14* 14*  ALT 12 11  ALKPHOS 79 70  BILITOT 0.5 0.3  PROT 7.3 6.6  ALBUMIN 3.6 3.0*     Radiology Studies: No results found.    LOS: 6 days    Flora Lipps, MD [facility] 04/23/2021, 11:51 AM

## 2021-04-23 NOTE — Discharge Summary (Signed)
Physician Discharge Summary   Patient: Sabrina [name] MRN: [id number] DOB: [birth year]  Admit date:     04/17/2021  Discharge date: 04/23/21  Discharge Physician: Flora Lipps   PCP: [name], MD   Recommendations at discharge:   Follow-up as per  hospice care provider.  Discharge Diagnoses: Principal Problem:   Acute exacerbation of chronic obstructive pulmonary disease (COPD) ([place]) Active Problems:   Acute GI bleeding   Aortic stenosis   Acute respiratory failure with hypoxia (HCC)   Chronic diastolic congestive heart failure (HCC)   Acute renal failure superimposed on stage 4 chronic kidney disease (HCC)   Type 2 diabetes mellitus with stage 4 chronic kidney disease (HCC)   Elevated troponin   Acute blood loss anemia   Hypercalcemia   Hypertensive urgency   Goals of care, counseling/discussion   Paroxysmal atrial fibrillation with RVR ([place])   Anemia due to chronic kidney disease   Hyponatremia   Obesity, Class I, BMI 30-34.9   Lethargy   Pressure ulcer, sacrum  Resolved Problems:   * No resolved hospital problems. *   Hospital Course: 88 years old female with past medical history of dementia, stage IV chronic kidney disease with baseline creatinine 2-2.5, hypertension, type 2 diabetes mellitus, chronic diastolic heart failure, PAF on Eliquis, COPD, moderate aortic stenosis, chronic anemia with baseline hemoglobin 8-9 presented to the ED 04/17/21 with shortness of breath for 1 day with new onset wheezing.  Patient was recently admitted hospital January 2023 for right lower lobe pneumonia.  In the ED patient had leukocytosis with elevated troponin and BNP.  EKG showed atrial fibrillation with RVR.  Cardizem bolus was given in the ED followed by drip and was admitted to the stepdown unit for A-fib with RVR, COPD exacerbation acute hypoxic respiratory failure mild dehydration AKI and hypercalcemia.  Cardiology was consulted.  Patient had maroon-colored large bowel  movement on 04/19/2021 thought to be secondary to acute GI bleed.  Palliative care was consulted and at this time, patient is awaiting for transfer to [facility] with comfort care.   Assessment and Plan: * Acute exacerbation of chronic obstructive pulmonary disease (COPD) ([place])- (present on admission) .COVID-19 and influenza PCR negative.  Procalcitonin elevated, continue Xopenex nebulizer.  Currently on room air  Acute GI bleeding Significant drop in hemoglobin with large maroon bowel movement on 04/19/21.  Was on Eliquis as outpatient.  No further bleeding.  No intervention planned.  Pressure ulcer, sacrum On comfort care  Lethargy On Dilaudid and Ativan.  On comfort care  Obesity, Class I, BMI 30-34.9 On comfort care at this time  Hyponatremia Latest sodium was 135.  Anemia due to chronic kidney disease On comfort care  Paroxysmal atrial fibrillation with RVR ([place])- (present on admission) On comfort care   Goals of care, counseling/discussion Patient was recently enrolled in hospice at the facility. Patient with active GI bleed severe anemia with multiple comorbidities. Overall prognosis is poor.  On comfort care.  Hypertensive urgency- (present on admission) Continue clonidine patch.  Hydralazine as needed  Atrial fibrillation with RVR ([place]) Admitted on Cardizem drip, transition to p.o. Cardizem, rate improving continue anticoagulation with Eliquis  Hypercalcemia- (present on admission) Latest calcium 11.3.    Acute blood loss anemia- (present on admission) Secondary to acute GI bleeding.  Currently on comfort care.  No further bleeding  Elevated troponin- (present on admission) Likely demand ischemia in the setting of A-fib RVR hypoxia.    Type 2 diabetes mellitus with stage 4  chronic kidney disease ([place])- (present on admission) Comfort care  Acute renal failure superimposed on stage 4 chronic kidney disease ([place])- (present on admission) Latest creatinine of  2.4 on 04/20/2021.  No recent labs.  Chronic diastolic congestive heart failure ([place])- (present on admission) Lasix on hold due to AKI.  On comfort care.  Acute respiratory failure with hypoxia ([place])- (present on admission)  Currently on 2 L.  On comfort care.  Aortic stenosis On comfort care   Consultants: Palliative care Procedures performed: None Disposition: Hospice care Diet recommendation:  Discharge Diet Orders (From admission, onward)     Start     Ordered   04/23/21 0000  Diet general        04/23/21 1323           Regular diet  DISCHARGE MEDICATION: Allergies as of 04/23/2021       Reactions   Beta Adrenergic Blockers Other (See Comments)   Fatigue        Medication List     STOP taking these medications    albuterol 108 (90 Base) MCG/ACT inhaler Commonly known as: VENTOLIN HFA   allopurinol 100 MG tablet Commonly known as: ZYLOPRIM   amoxicillin-clavulanate 875-125 MG tablet Commonly known as: AUGMENTIN   benzonatate 100 MG capsule Commonly known as: TESSALON   cholecalciferol 1000 units tablet Commonly known as: VITAMIN D   cloNIDine 0.2 MG tablet Commonly known as: CATAPRES   diltiazem 60 MG tablet Commonly known as: CARDIZEM   Eliquis 2.5 MG Tabs tablet Generic drug: apixaban   famotidine 20 MG tablet Commonly known as: PEPCID   furosemide 40 MG tablet Commonly known as: LASIX   glipiZIDE 5 MG tablet Commonly known as: GLUCOTROL   hydrALAZINE 50 MG tablet Commonly known as: APRESOLINE   ipratropium-albuterol 0.5-2.5 (3) MG/3ML Soln Commonly known as: DUONEB   isosorbide mononitrate 30 MG 24 hr tablet Commonly known as: IMDUR   morphine CONCENTRATE 10 mg / 0.5 ml concentrated solution   nystatin 100000 UNIT/ML suspension Commonly known as: MYCOSTATIN   ondansetron 4 MG tablet Commonly known as: ZOFRAN   polyethylene glycol 17 g packet Commonly known as: MIRALAX / GLYCOLAX   True Metrix Blood Glucose Test test  strip Generic drug: glucose blood   TRUEplus Lancets 28G Misc       TAKE these medications    acetaminophen 325 MG tablet Commonly known as: TYLENOL Take 2 tablets (650 mg total) by mouth every 6 (six) hours as needed for mild pain (or Fever >/= 101). What changed:  medication strength how much to take when to take this reasons to take this   bisacodyl 10 MG suppository Commonly known as: DULCOLAX Place 1 suppository (10 mg total) rectally daily as needed for moderate constipation.   cloNIDine 0.2 mg/24hr patch Commonly known as: CATAPRES - Dosed in mg/24 hr Place 2 patches (0.4 mg total) onto the skin once a week. Start taking on: 2021-05-07   HYDROmorphone 1 MG/ML injection Commonly known as: DILAUDID Inject 0.5 mLs (0.5 mg total) into the vein every 30 (thirty) minutes as needed for moderate pain or severe pain (or dyspnea).   HYDROmorphone 1 MG/ML injection Commonly known as: DILAUDID Inject 1 mL (1 mg total) into the vein every 4 (four) hours.   levalbuterol 1.25 MG/0.5ML nebulizer solution Commonly known as: XOPENEX Take 1.25 mg by nebulization every 4 (four) hours as needed for wheezing or shortness of breath.   LORazepam 2 MG/ML injection Commonly known as:  ATIVAN Inject 0.25 mLs (0.5 mg total) into the vein every 4 (four) hours as needed for anxiety.               Discharge Care Instructions  (From admission, onward)           Start     Ordered   04/23/21 0000  Discharge wound care:       Comments: Apply or reapply a small foam dressing to the left upper buttock skin tear area. Change every 3 days and prn.   04/23/21 1323             Subjective Today, patient was seen and examined at bedside.  Appears to be comfortable.  Patient's daughter at bedside.  Discharge Exam: Filed Weights   04/17/21 2339 04/19/21 0500 04/21/21 0500  Weight: 70.6 kg 70.1 kg 67.8 kg   General:  Average built, not in obvious distress HENT:   Pallor  noted, oral mucosa is moist.  Chest:  Diminished breath sounds bilaterally. No crackles or wheezes.  CVS: S1 &S2 heard. No murmur.  Regular rate and rhythm. Abdomen: Soft, nontender, nondistended.  Bowel sounds are heard.  Urethral catheter in place Extremities: No cyanosis, clubbing or edema.  Peripheral pulses are palpable. Psych: Appears comfortable CNS: Somnolent. Skin: Warm and dry.  No rashes noted.  Condition at discharge: stable  The results of significant diagnostics from this hospitalization (including imaging, microbiology, ancillary and laboratory) are listed below for reference.   Imaging Studies: CT CHEST WO CONTRAST  Result Date: 03/30/2021 CLINICAL DATA:  Suspected pneumonia in a 88 year old female. EXAM: CT CHEST WITHOUT CONTRAST TECHNIQUE: Multidetector CT imaging of the chest was performed following the standard protocol without IV contrast. RADIATION DOSE REDUCTION: This exam was performed according to the departmental dose-optimization program which includes automated exposure control, adjustment of the mA and/or kV according to patient size and/or use of iterative reconstruction technique. COMPARISON:  Comparison made with March 14, 2021. FINDINGS: Cardiovascular: Dense mitral annular and valvular calcifications. Heart size stable without pericardial effusion. Aortic caliber is normal with scattered atherosclerotic calcifications. Central pulmonary vasculature is normal caliber. Limited assessment of cardiovascular structures given lack of intravenous contrast. Mediastinum/Nodes: Mild fullness of subcarinal nodal tissue. No frank adenopathy in the chest. Esophagus is grossly normal. Lungs/Pleura: Post radiation changes along the anterolateral RIGHT upper lobe along with RIGHT apical scarring with similar appearance. Diffuse nodular ground-glass changes have improved considerably since previous imaging. These are nearly completely resolved. There is persistent RIGHT-sided  pleural fluid which is of similar volume compared to the prior study and is associated with volume loss at the RIGHT lung base. Airways are patent. Upper Abdomen: Cholelithiasis. Imaged portions of liver, gallbladder, pancreas, spleen, adrenal glands and of the kidneys are unremarkable. No acute upper abdominal findings to the extent evaluated. Musculoskeletal: Spinal degenerative changes. No acute or destructive bone finding. IMPRESSION: 1. Diffuse nodular ground-glass changes have improved considerably since the prior imaging. These are nearly completely resolved. 2. There is persistent RIGHT-sided pleural fluid which is of similar volume compared to the prior study and is associated with volume loss/consolidation at the RIGHT lung base. 3. Post radiation changes along the anterolateral RIGHT upper lobe along with RIGHT apical scarring with similar appearance. 4. Cholelithiasis. 5. Aortic atherosclerosis. Aortic Atherosclerosis (ICD10-I70.0). Electronically Signed   By: Zetta Bills M.D.   On: 03/30/2021 11:37   DG Chest Port 1 View  Result Date: 04/19/2021 CLINICAL DATA:  Increased shortness of breath EXAM: PORTABLE CHEST  1 VIEW COMPARISON:  Chest radiograph 04/17/2021 FINDINGS: The cardiomediastinal silhouette is stable. There is dense mitral annular calcification. A loop recorder is again seen. Aeration of the lungs is not significantly changed. There is no new or worsening focal airspace disease. There is no pleural effusion or pneumothorax. The bones are stable.  Right axillary surgical clips are again seen. IMPRESSION: Stable chest since 04/17/2021 with no new or worsening focal airspace disease. Electronically Signed   By: Valetta Mole M.D.   On: 04/19/2021 07:53   DG Chest Port 1 View  Result Date: 04/17/2021 CLINICAL DATA:  Short of breath.  Respiratory distress EXAM: PORTABLE CHEST 1 VIEW COMPARISON:  03/29/2021 FINDINGS: Heart size and vascularity normal. Improved aeration in the right lung  base which is now clear. Left lower lobe remains clear. Heart size normal. Cardiac loop recorder noted. Surgical clips right axilla. IMPRESSION: Interval improvement in right lower lobe airspace disease and right effusion. Lungs now clear. Electronically Signed   By: Franchot Gallo M.D.   On: 04/17/2021 17:34   DG Chest Portable 1 View  Result Date: 03/29/2021 CLINICAL DATA:  Dyspnea. EXAM: PORTABLE CHEST 1 VIEW COMPARISON:  Chest x-ray 03/09/2021.  CT chest 03/14/2021. FINDINGS: Small right pleural effusion and minimal right basilar opacities persist. The lungs are otherwise clear. Cardiomediastinal silhouette is within normal limits. No pneumothorax. No acute fracture. Right axillary surgical clips are again noted. IMPRESSION: 1. Stable small right pleural effusion with minimal right basilar atelectasis/airspace disease. Electronically Signed   By: Ronney Asters M.D.   On: 03/29/2021 20:29    Microbiology: Results for orders placed or performed during the hospital encounter of 04/17/21  Resp Panel by RT-PCR (Flu A&B, Covid) Nasopharyngeal Swab     Status: None   Collection Time: 04/17/21  8:00 PM   Specimen: Nasopharyngeal Swab; Nasopharyngeal(NP) swabs in vial transport medium  Result Value Ref Range Status   SARS Coronavirus 2 by RT PCR NEGATIVE NEGATIVE Final    Comment: (NOTE) SARS-CoV-2 target nucleic acids are NOT DETECTED.  The SARS-CoV-2 RNA is generally detectable in upper respiratory specimens during the acute phase of infection. The lowest concentration of SARS-CoV-2 viral copies this assay can detect is 138 copies/mL. A negative result does not preclude SARS-Cov-2 infection and should not be used as the sole basis for treatment or other patient management decisions. A negative result may occur with  improper specimen collection/handling, submission of specimen other than nasopharyngeal swab, presence of viral mutation(s) within the areas targeted by this assay, and inadequate  number of viral copies(<138 copies/mL). A negative result must be combined with clinical observations, patient history, and epidemiological information. The expected result is Negative.  Fact Sheet for Patients:  EntrepreneurPulse.com.au  Fact Sheet for Healthcare Providers:  IncredibleEmployment.be  This test is no t yet approved or cleared by the [place] FDA and  has been authorized for detection and/or diagnosis of SARS-CoV-2 by FDA under an Emergency Use Authorization (EUA). This EUA will remain  in effect (meaning this test can be used) for the duration of the COVID-19 declaration under Section 564(b)(1) of the Act, 21 U.S.C.section 360bbb-3(b)(1), unless the authorization is terminated  or revoked sooner.       Influenza A by PCR NEGATIVE NEGATIVE Final   Influenza B by PCR NEGATIVE NEGATIVE Final    Comment: (NOTE) The Xpert Xpress SARS-CoV-2/FLU/RSV plus assay is intended as an aid in the diagnosis of influenza from Nasopharyngeal swab specimens and should not be used as a  sole basis for treatment. Nasal washings and aspirates are unacceptable for Xpert Xpress SARS-CoV-2/FLU/RSV testing.  Fact Sheet for Patients: EntrepreneurPulse.com.au  Fact Sheet for Healthcare Providers: IncredibleEmployment.be  This test is not yet approved or cleared by the [place] FDA and has been authorized for detection and/or diagnosis of SARS-CoV-2 by FDA under an Emergency Use Authorization (EUA). This EUA will remain in effect (meaning this test can be used) for the duration of the COVID-19 declaration under Section 564(b)(1) of the Act, 21 U.S.C. section 360bbb-3(b)(1), unless the authorization is terminated or revoked.  Performed at [facility], [place] [address], [place], [place] [address]   MRSA Next Gen by PCR, Nasal     Status: None   Collection Time: 04/17/21 10:33 PM    Specimen: Nasal Mucosa; Nasal Swab  Result Value Ref Range Status   MRSA by PCR Next Gen NOT DETECTED NOT DETECTED Final    Comment: (NOTE) The GeneXpert MRSA Assay (FDA approved for NASAL specimens only), is one component of a comprehensive MRSA colonization surveillance program. It is not intended to diagnose MRSA infection nor to guide or monitor treatment for MRSA infections. Test performance is not FDA approved in patients less than 28 years old. Performed at [facility], [place] [address], [place], [place] [address]     Labs: CBC: Recent Labs  Lab 04/17/21 1643 04/17/21 1704 04/18/21 0251 04/19/21 1024  WBC 18.6*  --  12.6* 23.1*  NEUTROABS 14.0*  --  11.1*  --   HGB 9.3* 10.2* 8.2* 5.7*  HCT 29.5* 30.0* 26.0* 17.5*  MCV 99.7  --  98.9 96.7  PLT 314  --  235 768   Basic Metabolic Panel: Recent Labs  Lab 04/17/21 1643 04/17/21 1704 04/18/21 0251 04/19/21 0243 04/20/21 0324  NA 130* 129* 130* 134* 135  K 4.5 4.6 4.4 4.3 4.9  CL 87* 90* 89* 93* 94*  CO2 32  --  29 33* 29  GLUCOSE 412* 412* 422* 350* 233*  BUN 81* 85* 77* 83* 110*  CREATININE 2.47* 2.50* 2.31* 2.14* 2.42*  CALCIUM 11.5*  --  11.1* 11.3*   11.2* 11.3*  MG 2.2  --  2.4  --   --   PHOS  --   --  3.4  --   --    Liver Function Tests: Recent Labs  Lab 04/17/21 1643 04/18/21 0251  AST 14* 14*  ALT 12 11  ALKPHOS 79 70  BILITOT 0.5 0.3  PROT 7.3 6.6  ALBUMIN 3.6 3.0*   CBG: Recent Labs  Lab 04/18/21 1641 04/18/21 2127 04/18/21 2226 04/19/21 0820 04/19/21 1136  GLUCAP 376* 340* 340* 335* 254*    Discharge time spent: greater than 30 minutes.  Signed: Flora Lipps, MD [facility] 04/23/2021

## 2021-04-23 NOTE — Progress Notes (Signed)
Manufacturing engineer [facility]) Hospital Liaison note.     This patient is approved to transfer to [facility] today.    [facility] will notify TOC when registration paperwork has been completed to arrange transport with [facility].  RN please call report to [phone].   Thank you,     Farrel Gordon, RN, [facility] Liaison  [phone]

## 2021-04-23 NOTE — Plan of Care (Addendum)
Patient discharged to [facility] via EMS. Report called to [place] at [facility]. Ivan Anchors, RN 04/23/21 5:02 PM  IV site left in place per facility request. Ivan Anchors, RN 04/23/21 5:02 PM

## 2021-04-23 NOTE — Progress Notes (Signed)
Daily Progress Note   Patient Name: Sabrina [name]       Date: 04/23/2021 DOB: Mar 07, 1926  Age: 88 y.o. MRN#: [id number] Attending Physician: Flora Lipps, MD Primary Care Physician: [name], MD Admit Date: 04/17/2021  Reason for Consultation/Follow-up: Terminal Care  Subjective:  Actively dying, caregiver at bedside, pale weak shallow breathing now off O2    Length of Stay: 6  Current Medications: Scheduled Meds:   Chlorhexidine Gluconate Cloth  6 each Topical Daily   cloNIDine  0.4 mg Transdermal Weekly    HYDROmorphone (DILAUDID) injection  1 mg Intravenous Q4H    Continuous Infusions:    PRN Meds: acetaminophen **OR** acetaminophen, bisacodyl, diltiazem, hydrALAZINE, hydrALAZINE, HYDROmorphone (DILAUDID) injection, levalbuterol, LORazepam  Physical Exam         Lethargic appearing elderly lady Appears comfortable Actively dying Diminished breath sounds S 1 S 2  No edema Abdomen is distended  Vital Signs: BP (!) 162/62 (BP Location: Right Arm)    Pulse 98    Temp 98.6 F (37 C) (Axillary)    Resp 13    Ht 4\' 11"  (1.499 m)    Wt 67.8 kg    SpO2 (!) 87%    BMI 30.19 kg/m  SpO2: SpO2: (!) 87 % O2 Device: O2 Device: Room Air O2 Flow Rate: O2 Flow Rate (L/min): 2 L/min  Intake/output summary:  Intake/Output Summary (Last 24 hours) at 04/23/2021 1222 Last data filed at 04/23/2021 0551 Gross per 24 hour  Intake 0 ml  Output 120 ml  Net -120 ml    LBM: Last BM Date : 04/20/21 Baseline Weight: Weight: 72.6 kg Most recent weight: Weight: 67.8 kg       Palliative Assessment/Data:      Patient Active Problem List   Diagnosis Date Noted   Lethargy 04/19/2021   Acute GI bleeding 04/19/2021   Pressure ulcer, sacrum 04/19/2021   Anemia due to  chronic kidney disease 04/18/2021   Hyponatremia 04/18/2021   Obesity, Class I, BMI 30-34.9 04/18/2021   Acute exacerbation of chronic obstructive pulmonary disease (COPD) ([place]) 04/17/2021   Paroxysmal atrial fibrillation with RVR (HCC) 04/17/2021   Goals of care, counseling/discussion 04/13/2021   Pneumonia 03/30/2021   Acute and chronic respiratory failure with hypoxia (HCC) 03/29/2021   Hypertensive urgency    Demand ischemia ([place])  COPD (chronic obstructive pulmonary disease) ([place]) 03/10/2021   Atrial fibrillation with RVR (HCC) 03/10/2021   Normocytic anemia 02/08/2021   Symptomatic anemia 02/07/2021   Nonspecific abnormal electrocardiogram (ECG) (EKG)    Dyspnea 12/18/2020   Abnormal gait 05/18/2020   Body mass index (BMI) 35.0-35.9, adult 05/18/2020   Cardiac pacemaker in situ 05/18/2020   Diabetic nephropathy ([place]) 05/18/2020   IgM monoclonal gammopathy of uncertain significance 05/18/2020   Malignant hypertensive chronic kidney disease 05/18/2020   Morbid obesity ([place]) 05/18/2020   Thrombophilia ([place]) 05/18/2020   Secondary hypercoagulable state ([place]) 08/16/2019   Osteoporosis 05/04/2019   Hypercalcemia 11/04/2018   History of stroke 11/04/2018   Paroxysmal atrial fibrillation ([place]) 11/04/2018   Acute kidney injury ([place])    Acute metabolic encephalopathy 45/36/4680   Personal history of DVT and pulm embolus 10/26/2018   Personal history of other venous thrombosis and embolism 10/26/2018   UTI (urinary tract infection) 10/24/2018   Clavicle fracture 09/26/2018   Degeneration of lumbar intervertebral disc 10/01/2017   Blurry vision, bilateral    Acute blood loss anemia    History of breast cancer 05/30/2017   Physical deconditioning 05/30/2017   Occipital infarction ([place]) 05/30/2017   Pressure injury of skin 05/28/2017   Fall at home 05/27/2017   Acute renal failure superimposed on stage 4 chronic kidney disease ([place]) 05/27/2017   High cholesterol 05/27/2017    Type 2 diabetes mellitus with stage 4 chronic kidney disease ([place]) 05/27/2017   Hypertension associated with diabetes ([place]) 05/27/2017   Gout 05/27/2017   Elevated troponin 05/27/2017   Lumbar radiculopathy 05/15/2017   Pain of right calf 04/30/2017   Asymmetrical left sensorineural hearing loss 02/14/2016   Impacted cerumen of left ear 02/14/2016   Lipoma of lower extremity 09/12/2014   Abnormal x-ray 03/15/2014   Chronic diastolic congestive heart failure ([place]) 02/23/2014   Candidiasis of skin 01/13/2014   Osteopenia 11/30/2013   Hypoglycemia 09/13/2013   Acute respiratory failure with hypoxia ([place]) 06/14/2013   History of pulmonary embolism 06/10/2013   Nausea and vomiting 06/10/2013   Accelerated hypertension 06/10/2013   Candidiasis of vagina 05/19/2013   Aortic stenosis 04/22/2013   Edema leg 04/22/2013   Breast cancer of upper-outer quadrant of right female breast ([place]); in remission 03/19/2013    Palliative Care Assessment & Plan   Patient Profile:    Assessment:  11 F w / h/o dementia, stage IV chronic kidney disease with baseline creatinine 2-2.5, hypertension, type 2 diabetes mellitus, chronic diastolic heart failure, PAF on Eliquis, COPD, moderate aortic stenosis, chronic anemia with baseline hemoglobin 8-9 presented to ED 2/14 with shortness of breath with 1 day history of progressive worsening shortness of breath, new onset wheezing.  Recently hospitalized in January for right lower lobe pneumonia.   Hospital course complicated by GI bleed, low Hgb, further decline in mental status, family elected for comfort measures and now awaiting residential hospice.   Recommendations/Plan: Continue current supportive comfort measures and transfer to residential hospice when bed available, hospice liaisons following closely.   On scheduled IV Dilaudid to 1 mg, continue PRN IV Dilaudid and PRN IV Ativan and monitor.  Ok to d.c supplemental O2.   Goals of Care and Additional  Recommendations: Limitations on Scope of Treatment: Full Comfort Care  Code Status:    Code Status Orders  (From admission, onward)           Start     Ordered   04/17/21 1933  Do not attempt resuscitation (  DNR)  Continuous       Question Answer Comment  In the event of cardiac or respiratory ARREST Do not call a code blue   In the event of cardiac or respiratory ARREST Do not perform Intubation, CPR, defibrillation or ACLS   In the event of cardiac or respiratory ARREST Use medication by any route, position, wound care, and other measures to relive pain and suffering. May use oxygen, suction and manual treatment of airway obstruction as needed for comfort.   Comments DNR confirmed w/ nephew      04/17/21 1932           Code Status History     Date Active Date Inactive Code Status Order ID Comments User Context   04/17/2021 1914 04/17/2021 1932 DNR [id number]  Drenda Freeze, MD ED   03/30/2021 0133 04/02/2021 1806 DNR [id number]  Vianne Bulls, MD ED   03/29/2021 2153 03/30/2021 0133 DNR [id number]  Fredia Sorrow, MD ED   03/10/2021 0906 03/16/2021 0409 DNR [id number]  Jonnie Finner, DO Inpatient   02/07/2021 1924 02/13/2021 1658 DNR [id number]  Lenore Cordia, MD ED   02/07/2021 1249 02/07/2021 1924 DNR [id number]  Fredia Sorrow, MD ED   12/19/2020 1508 12/22/2020 1915 DNR [id number]  Kathie Dike, MD Inpatient   12/18/2020 1632 12/19/2020 1508 Full Code [id number]  Jonnie Finner, DO Inpatient   11/05/2018 0007 11/06/2018 2235 DNR [id number]  Lenore Cordia, MD ED   10/24/2018 1445 10/28/2018 2017 Full Code [id number]  Terrilee Croak, MD ED   09/26/2018 1733 09/29/2018 2318 Full Code [id number]  Barb Merino, MD Inpatient   06/25/2017 0333 06/27/2017 1736 Full Code [id number]  Vianne Bulls, MD ED   05/30/2017 1638 06/04/2017 1826 Full Code [id number]  Rondel Jumbo, PA-C ED   05/27/2017 1210 05/28/2017 2114 Full Code [id number]  Patrecia Pour, MD ED   09/13/2013 1055 09/14/2013 1745  Full Code [id number]  Reyne Dumas, MD ED   06/10/2013 2100 06/19/2013 1940 Full Code [id number]  Mendel Corning, MD Inpatient   04/15/2013 2004 04/16/2013 1156 Full Code [id number]  Rolm Bookbinder, MD Inpatient   03/30/2013 2104 03/31/2013 2039 Full Code [id number]  Rolm Bookbinder, MD Inpatient      Advance Directive Documentation    Flowsheet Row Most Recent Value  Type of Advance Directive Healthcare Power of Attorney, Out of facility DNR (pink MOST or yellow form)  Pre-existing out of facility DNR order (yellow form or pink MOST form) --  "MOST" Form in Place? --       Prognosis:  < 2 weeks  Discharge Planning: Hospice facility  Care plan was discussed with caregiver at bedside, IDT.   Thank you for allowing the Palliative Medicine Team to assist in the care of this patient.   Time In: 12 Time Out: 12.15 Total Time 15 Prolonged Time Billed  no       Greater than 50%  of this time was spent counseling and coordinating care related to the above assessment and plan.  Loistine Chance, MD  Please contact Palliative Medicine Team phone at [phone] for questions and concerns.

## 2021-04-23 NOTE — TOC Transition Note (Signed)
Transition of Care [facility]) - CM/SW Discharge Note   Patient Details  Name: Sabrina [name] MRN: [id number] Date of Birth: 05-29-1926  Transition of Care [facility] CM/SW Contact:  Lennart Pall, LCSW Phone Number: 04/23/2021, 2:34 PM   Clinical Narrative:    Notified that bed available today at [facility] and pt medically cleared for dc.  Have alerted pt's nephew.  Have called [facility] for transport and RN to call report to [phone].  No further TOC needs.   Final next level of care: [place] Barriers to Discharge: Barriers Resolved   Patient Goals and CMS Choice Patient states their goals for this hospitalization and ongoing recovery are:: NA      Discharge Placement                Patient to be transferred to facility by: [facility] Name of family member notified: nephew Patient and family notified of of transfer: 04/23/21  Discharge Plan and Services                DME Arranged: N/A DME Agency: NA                  Social Determinants of Health ([place]) Interventions     Readmission Risk Interventions Readmission Risk Prevention Plan 12/22/2020 12/20/2020  Transportation Screening Complete Complete  PCP or Specialist Appt within 5-7 Days Complete Complete  Home Care Screening Complete Complete  Medication Review (RN CM) Complete Complete  Some recent data might be hidden

## 2021-04-23 NOTE — Plan of Care (Signed)
Patient progressing toward goal of comfort in all areas.  Problem: Coping: Goal: Level of anxiety will decrease Outcome: Progressing   Problem: Pain Managment: Goal: General experience of comfort will improve Outcome: Progressing   Problem: Safety: Goal: Ability to remain free from injury will improve Outcome: Jones, RN 04/23/21 2:15 PM

## 2021-05-02 DEATH — deceased

## 2021-05-04 ENCOUNTER — Encounter: Payer: Self-pay | Admitting: Oncology

## 2021-06-07 ENCOUNTER — Encounter: Payer: Self-pay | Admitting: Oncology

## 2021-06-14 ENCOUNTER — Ambulatory Visit: Payer: Self-pay | Admitting: Sports Medicine

## 2023-01-22 NOTE — Telephone Encounter (Signed)
Telephone call
